# Patient Record
Sex: Male | Born: 1955 | Race: Black or African American | Hispanic: No | Marital: Married | Smoking: Former smoker
Health system: Southern US, Community
[De-identification: ages and names within clinical notes are randomized; demographics above are authoritative.]

## PROBLEM LIST (undated history)

## (undated) DIAGNOSIS — I5040 Unspecified combined systolic (congestive) and diastolic (congestive) heart failure: Secondary | ICD-10-CM

## (undated) DIAGNOSIS — N179 Acute kidney failure, unspecified: Secondary | ICD-10-CM

## (undated) DIAGNOSIS — I420 Dilated cardiomyopathy: Secondary | ICD-10-CM

## (undated) DIAGNOSIS — G40109 Localization-related (focal) (partial) symptomatic epilepsy and epileptic syndromes with simple partial seizures, not intractable, without status epilepticus: Principal | ICD-10-CM

## (undated) DIAGNOSIS — S81009A Unspecified open wound, unspecified knee, initial encounter: Secondary | ICD-10-CM

## (undated) DIAGNOSIS — I509 Heart failure, unspecified: Secondary | ICD-10-CM

## (undated) DIAGNOSIS — D649 Anemia, unspecified: Secondary | ICD-10-CM

## (undated) DIAGNOSIS — E1140 Type 2 diabetes mellitus with diabetic neuropathy, unspecified: Secondary | ICD-10-CM

## (undated) DIAGNOSIS — E46 Unspecified protein-calorie malnutrition: Secondary | ICD-10-CM

## (undated) DIAGNOSIS — Z91148 Patient's other noncompliance with medication regimen for other reason: Secondary | ICD-10-CM

## (undated) DIAGNOSIS — Z9114 Patient's other noncompliance with medication regimen: Secondary | ICD-10-CM

## (undated) DIAGNOSIS — I10 Essential (primary) hypertension: Secondary | ICD-10-CM

## (undated) DIAGNOSIS — T879 Unspecified complications of amputation stump: Secondary | ICD-10-CM

## (undated) DIAGNOSIS — IMO0001 Reserved for inherently not codable concepts without codable children: Secondary | ICD-10-CM

## (undated) DIAGNOSIS — I5023 Acute on chronic systolic (congestive) heart failure: Secondary | ICD-10-CM

## (undated) DIAGNOSIS — G629 Polyneuropathy, unspecified: Secondary | ICD-10-CM

## (undated) DIAGNOSIS — H5440 Blindness, one eye, unspecified eye: Secondary | ICD-10-CM

## (undated) DIAGNOSIS — I739 Peripheral vascular disease, unspecified: Secondary | ICD-10-CM

## (undated) HISTORY — PX: MULTIPLE TOOTH EXTRACTIONS: SHX2053

## (undated) HISTORY — PX: COLONOSCOPY: SHX174

## (undated) HISTORY — PX: TONSILLECTOMY: SUR1361

## (undated) HISTORY — DX: Localization-related (focal) (partial) symptomatic epilepsy and epileptic syndromes with simple partial seizures, not intractable, without status epilepticus: G40.109

---

## 2000-09-04 ENCOUNTER — Encounter: Payer: Self-pay | Admitting: Emergency Medicine

## 2000-09-04 ENCOUNTER — Emergency Department (HOSPITAL_COMMUNITY): Admission: EM | Admit: 2000-09-04 | Discharge: 2000-09-04 | Payer: Self-pay | Admitting: *Deleted

## 2004-03-28 ENCOUNTER — Ambulatory Visit: Payer: Self-pay | Admitting: Family Medicine

## 2004-03-28 ENCOUNTER — Inpatient Hospital Stay (HOSPITAL_COMMUNITY): Admission: EM | Admit: 2004-03-28 | Discharge: 2004-03-30 | Payer: Self-pay | Admitting: *Deleted

## 2004-04-14 ENCOUNTER — Encounter: Admission: RE | Admit: 2004-04-14 | Discharge: 2004-07-13 | Payer: Self-pay | Admitting: Family Medicine

## 2004-04-19 ENCOUNTER — Ambulatory Visit: Payer: Self-pay | Admitting: Sports Medicine

## 2004-06-20 ENCOUNTER — Ambulatory Visit: Payer: Self-pay | Admitting: Family Medicine

## 2004-07-17 ENCOUNTER — Ambulatory Visit: Payer: Self-pay | Admitting: Family Medicine

## 2004-08-11 ENCOUNTER — Encounter: Admission: RE | Admit: 2004-08-11 | Discharge: 2004-11-09 | Payer: Self-pay | Admitting: Family Medicine

## 2004-08-11 ENCOUNTER — Encounter: Admission: RE | Admit: 2004-08-11 | Discharge: 2004-08-11 | Payer: Self-pay | Admitting: Sports Medicine

## 2004-09-19 ENCOUNTER — Ambulatory Visit: Payer: Self-pay | Admitting: Family Medicine

## 2004-10-27 ENCOUNTER — Ambulatory Visit: Payer: Self-pay | Admitting: Family Medicine

## 2004-11-16 ENCOUNTER — Ambulatory Visit: Payer: Self-pay | Admitting: Family Medicine

## 2005-01-02 ENCOUNTER — Ambulatory Visit: Payer: Self-pay | Admitting: Family Medicine

## 2005-01-03 ENCOUNTER — Encounter: Admission: RE | Admit: 2005-01-03 | Discharge: 2005-03-04 | Payer: Self-pay | Admitting: Family Medicine

## 2005-01-22 ENCOUNTER — Ambulatory Visit: Payer: Self-pay | Admitting: Family Medicine

## 2005-03-07 ENCOUNTER — Encounter: Admission: RE | Admit: 2005-03-07 | Discharge: 2005-06-05 | Payer: Self-pay | Admitting: Family Medicine

## 2005-03-15 ENCOUNTER — Ambulatory Visit: Payer: Self-pay | Admitting: Family Medicine

## 2005-05-08 ENCOUNTER — Ambulatory Visit: Payer: Self-pay | Admitting: Family Medicine

## 2005-06-05 ENCOUNTER — Ambulatory Visit: Payer: Self-pay | Admitting: Family Medicine

## 2005-08-20 ENCOUNTER — Ambulatory Visit: Payer: Self-pay | Admitting: Sports Medicine

## 2005-10-02 ENCOUNTER — Ambulatory Visit: Payer: Self-pay | Admitting: Family Medicine

## 2005-11-15 ENCOUNTER — Ambulatory Visit: Payer: Self-pay | Admitting: Family Medicine

## 2005-12-20 ENCOUNTER — Ambulatory Visit: Payer: Self-pay | Admitting: Sports Medicine

## 2006-01-18 ENCOUNTER — Ambulatory Visit: Payer: Self-pay

## 2006-02-20 ENCOUNTER — Ambulatory Visit: Payer: Self-pay | Admitting: Family Medicine

## 2006-03-25 ENCOUNTER — Ambulatory Visit: Payer: Self-pay | Admitting: Sports Medicine

## 2006-04-16 ENCOUNTER — Ambulatory Visit: Payer: Self-pay | Admitting: Family Medicine

## 2006-04-16 ENCOUNTER — Encounter (INDEPENDENT_AMBULATORY_CARE_PROVIDER_SITE_OTHER): Payer: Self-pay | Admitting: Family Medicine

## 2006-04-16 LAB — CONVERTED CEMR LAB
ALT: 159 units/L — ABNORMAL HIGH (ref 0–53)
AST: 108 units/L — ABNORMAL HIGH (ref 0–37)
Albumin: 4.7 g/dL (ref 3.5–5.2)
Alkaline Phosphatase: 86 units/L (ref 39–117)
BUN: 19 mg/dL (ref 6–23)
CO2: 29 meq/L (ref 19–32)
Calcium: 10.1 mg/dL (ref 8.4–10.5)
Chloride: 98 meq/L (ref 96–112)
Creatinine, Ser: 1.19 mg/dL (ref 0.40–1.50)
Glucose, Bld: 136 mg/dL — ABNORMAL HIGH (ref 70–99)
Potassium: 4.7 meq/L (ref 3.5–5.3)
Sodium: 139 meq/L (ref 135–145)
Total Bilirubin: 0.9 mg/dL (ref 0.3–1.2)
Total Protein: 8.2 g/dL (ref 6.0–8.3)

## 2006-04-17 ENCOUNTER — Encounter (INDEPENDENT_AMBULATORY_CARE_PROVIDER_SITE_OTHER): Payer: Self-pay | Admitting: Family Medicine

## 2006-04-17 LAB — CONVERTED CEMR LAB
HCV Ab: NEGATIVE
Hep A IgM: NEGATIVE
Hep B C IgM: NEGATIVE
Hepatitis B Surface Ag: NEGATIVE

## 2006-04-24 ENCOUNTER — Encounter: Admission: RE | Admit: 2006-04-24 | Discharge: 2006-04-24 | Payer: Self-pay | Admitting: Sports Medicine

## 2006-05-02 DIAGNOSIS — E1165 Type 2 diabetes mellitus with hyperglycemia: Secondary | ICD-10-CM | POA: Insufficient documentation

## 2006-05-02 DIAGNOSIS — I10 Essential (primary) hypertension: Secondary | ICD-10-CM | POA: Insufficient documentation

## 2006-05-02 DIAGNOSIS — E119 Type 2 diabetes mellitus without complications: Secondary | ICD-10-CM | POA: Insufficient documentation

## 2006-07-02 ENCOUNTER — Ambulatory Visit: Payer: Self-pay | Admitting: Family Medicine

## 2006-07-02 LAB — CONVERTED CEMR LAB: Hgb A1c MFr Bld: 8 %

## 2006-11-18 ENCOUNTER — Encounter (INDEPENDENT_AMBULATORY_CARE_PROVIDER_SITE_OTHER): Payer: Self-pay | Admitting: *Deleted

## 2007-01-07 ENCOUNTER — Ambulatory Visit: Payer: Self-pay | Admitting: Family Medicine

## 2007-01-07 ENCOUNTER — Encounter (INDEPENDENT_AMBULATORY_CARE_PROVIDER_SITE_OTHER): Payer: Self-pay | Admitting: Family Medicine

## 2007-01-07 LAB — CONVERTED CEMR LAB
ALT: 37 units/L (ref 0–53)
AST: 34 units/L (ref 0–37)
Albumin: 4.4 g/dL (ref 3.5–5.2)
Alkaline Phosphatase: 98 units/L (ref 39–117)
BUN: 11 mg/dL (ref 6–23)
CO2: 26 meq/L (ref 19–32)
Calcium: 10 mg/dL (ref 8.4–10.5)
Chloride: 100 meq/L (ref 96–112)
Creatinine, Ser: 1.18 mg/dL (ref 0.40–1.50)
Direct LDL: 78 mg/dL
Glucose, Bld: 143 mg/dL — ABNORMAL HIGH (ref 70–99)
Hgb A1c MFr Bld: 11.3 %
Potassium: 4.3 meq/L (ref 3.5–5.3)
Protein, U semiquant: 100
Sodium: 139 meq/L (ref 135–145)
Total Bilirubin: 0.5 mg/dL (ref 0.3–1.2)
Total Protein: 7.6 g/dL (ref 6.0–8.3)

## 2007-01-08 ENCOUNTER — Encounter (INDEPENDENT_AMBULATORY_CARE_PROVIDER_SITE_OTHER): Payer: Self-pay | Admitting: Family Medicine

## 2007-01-16 ENCOUNTER — Telehealth (INDEPENDENT_AMBULATORY_CARE_PROVIDER_SITE_OTHER): Payer: Self-pay | Admitting: Family Medicine

## 2007-01-17 ENCOUNTER — Encounter: Payer: Self-pay | Admitting: *Deleted

## 2007-02-05 ENCOUNTER — Ambulatory Visit: Payer: Self-pay | Admitting: Family Medicine

## 2007-03-07 ENCOUNTER — Ambulatory Visit: Payer: Self-pay | Admitting: Family Medicine

## 2007-03-10 ENCOUNTER — Telehealth (INDEPENDENT_AMBULATORY_CARE_PROVIDER_SITE_OTHER): Payer: Self-pay | Admitting: Family Medicine

## 2007-04-07 ENCOUNTER — Encounter (INDEPENDENT_AMBULATORY_CARE_PROVIDER_SITE_OTHER): Payer: Self-pay | Admitting: *Deleted

## 2007-04-07 ENCOUNTER — Ambulatory Visit: Payer: Self-pay | Admitting: Family Medicine

## 2007-04-07 LAB — CONVERTED CEMR LAB: Hgb A1c MFr Bld: 10.5 %

## 2007-04-08 ENCOUNTER — Encounter (INDEPENDENT_AMBULATORY_CARE_PROVIDER_SITE_OTHER): Payer: Self-pay | Admitting: *Deleted

## 2007-04-08 LAB — CONVERTED CEMR LAB
ALT: 37 units/L (ref 0–53)
AST: 30 units/L (ref 0–37)
Albumin: 4.5 g/dL (ref 3.5–5.2)
Alkaline Phosphatase: 82 units/L (ref 39–117)
BUN: 16 mg/dL (ref 6–23)
CO2: 24 meq/L (ref 19–32)
Calcium: 9.6 mg/dL (ref 8.4–10.5)
Chloride: 104 meq/L (ref 96–112)
Creatinine, Ser: 0.97 mg/dL (ref 0.40–1.50)
Direct LDL: 100 mg/dL — ABNORMAL HIGH
Glucose, Bld: 156 mg/dL — ABNORMAL HIGH (ref 70–99)
Potassium: 4.3 meq/L (ref 3.5–5.3)
Sodium: 141 meq/L (ref 135–145)
Total Bilirubin: 0.4 mg/dL (ref 0.3–1.2)
Total Protein: 7.6 g/dL (ref 6.0–8.3)

## 2007-05-07 ENCOUNTER — Ambulatory Visit: Payer: Self-pay | Admitting: Family Medicine

## 2007-05-07 ENCOUNTER — Encounter (INDEPENDENT_AMBULATORY_CARE_PROVIDER_SITE_OTHER): Payer: Self-pay | Admitting: *Deleted

## 2007-05-07 LAB — CONVERTED CEMR LAB
BUN: 20 mg/dL (ref 6–23)
CO2: 22 meq/L (ref 19–32)
Calcium: 9.7 mg/dL (ref 8.4–10.5)
Chloride: 100 meq/L (ref 96–112)
Creatinine, Ser: 1.12 mg/dL (ref 0.40–1.50)
Glucose, Bld: 191 mg/dL — ABNORMAL HIGH (ref 70–99)
Potassium: 4.3 meq/L (ref 3.5–5.3)
Sodium: 138 meq/L (ref 135–145)

## 2007-06-09 ENCOUNTER — Ambulatory Visit: Payer: Self-pay | Admitting: Family Medicine

## 2007-06-09 DIAGNOSIS — E669 Obesity, unspecified: Secondary | ICD-10-CM | POA: Insufficient documentation

## 2007-06-20 ENCOUNTER — Encounter (INDEPENDENT_AMBULATORY_CARE_PROVIDER_SITE_OTHER): Payer: Self-pay | Admitting: Family Medicine

## 2007-07-08 ENCOUNTER — Ambulatory Visit: Payer: Self-pay | Admitting: Family Medicine

## 2007-07-08 ENCOUNTER — Encounter (INDEPENDENT_AMBULATORY_CARE_PROVIDER_SITE_OTHER): Payer: Self-pay | Admitting: *Deleted

## 2007-07-10 LAB — CONVERTED CEMR LAB
BUN: 11 mg/dL (ref 6–23)
CO2: 25 meq/L (ref 19–32)
Calcium: 9.8 mg/dL (ref 8.4–10.5)
Chloride: 100 meq/L (ref 96–112)
Cholesterol: 161 mg/dL (ref 0–200)
Creatinine, Ser: 1.08 mg/dL (ref 0.40–1.50)
Glucose, Bld: 144 mg/dL — ABNORMAL HIGH (ref 70–99)
HDL: 43 mg/dL (ref >39)
LDL Cholesterol: 88 mg/dL (ref 0–99)
PSA: 0.37 ng/mL (ref 0.10–4.00)
Potassium: 4.4 meq/L (ref 3.5–5.3)
Sodium: 140 meq/L (ref 135–145)
Total CHOL/HDL Ratio: 3.7
Triglycerides: 150 mg/dL — ABNORMAL HIGH (ref <150)
VLDL: 30 mg/dL (ref 0–40)

## 2007-08-11 ENCOUNTER — Ambulatory Visit: Payer: Self-pay | Admitting: Sports Medicine

## 2007-09-30 ENCOUNTER — Ambulatory Visit: Payer: Self-pay | Admitting: Family Medicine

## 2007-09-30 ENCOUNTER — Encounter (INDEPENDENT_AMBULATORY_CARE_PROVIDER_SITE_OTHER): Payer: Self-pay | Admitting: Family Medicine

## 2007-10-01 DIAGNOSIS — N179 Acute kidney failure, unspecified: Secondary | ICD-10-CM | POA: Insufficient documentation

## 2007-10-01 LAB — CONVERTED CEMR LAB
BUN: 22 mg/dL (ref 6–23)
CO2: 23 meq/L (ref 19–32)
Calcium: 9.9 mg/dL (ref 8.4–10.5)
Chloride: 95 meq/L — ABNORMAL LOW (ref 96–112)
Creatinine, Ser: 1.72 mg/dL — ABNORMAL HIGH (ref 0.40–1.50)
Glucose, Bld: 389 mg/dL — ABNORMAL HIGH (ref 70–99)
Potassium: 3.8 meq/L (ref 3.5–5.3)
Sodium: 135 meq/L (ref 135–145)

## 2007-10-07 ENCOUNTER — Encounter (INDEPENDENT_AMBULATORY_CARE_PROVIDER_SITE_OTHER): Payer: Self-pay | Admitting: Family Medicine

## 2007-10-07 ENCOUNTER — Ambulatory Visit: Payer: Self-pay | Admitting: Family Medicine

## 2007-10-07 LAB — CONVERTED CEMR LAB
BUN: 21 mg/dL (ref 6–23)
CO2: 23 meq/L (ref 19–32)
Calcium: 9.9 mg/dL (ref 8.4–10.5)
Chloride: 101 meq/L (ref 96–112)
Creatinine, Ser: 1.11 mg/dL (ref 0.40–1.50)
Glucose, Bld: 309 mg/dL — ABNORMAL HIGH (ref 70–99)
Potassium: 4.7 meq/L (ref 3.5–5.3)
Sodium: 137 meq/L (ref 135–145)

## 2007-10-08 ENCOUNTER — Encounter (INDEPENDENT_AMBULATORY_CARE_PROVIDER_SITE_OTHER): Payer: Self-pay | Admitting: Family Medicine

## 2007-10-29 ENCOUNTER — Ambulatory Visit: Payer: Self-pay | Admitting: Family Medicine

## 2007-11-17 ENCOUNTER — Ambulatory Visit: Payer: Self-pay | Admitting: Family Medicine

## 2007-11-24 ENCOUNTER — Telehealth: Payer: Self-pay | Admitting: Pharmacist

## 2007-12-01 ENCOUNTER — Telehealth: Payer: Self-pay | Admitting: Pharmacist

## 2007-12-08 ENCOUNTER — Ambulatory Visit: Payer: Self-pay | Admitting: Family Medicine

## 2007-12-08 ENCOUNTER — Encounter (INDEPENDENT_AMBULATORY_CARE_PROVIDER_SITE_OTHER): Payer: Self-pay | Admitting: Family Medicine

## 2007-12-17 ENCOUNTER — Ambulatory Visit: Payer: Self-pay | Admitting: Family Medicine

## 2007-12-29 ENCOUNTER — Ambulatory Visit: Payer: Self-pay | Admitting: Family Medicine

## 2008-02-09 ENCOUNTER — Ambulatory Visit: Payer: Self-pay | Admitting: Family Medicine

## 2008-02-09 LAB — CONVERTED CEMR LAB: Hgb A1c MFr Bld: 10.6 %

## 2008-02-10 ENCOUNTER — Telehealth: Payer: Self-pay | Admitting: *Deleted

## 2008-02-23 ENCOUNTER — Ambulatory Visit: Payer: Self-pay | Admitting: Family Medicine

## 2008-03-25 ENCOUNTER — Ambulatory Visit: Payer: Self-pay | Admitting: Family Medicine

## 2008-04-22 ENCOUNTER — Ambulatory Visit: Payer: Self-pay | Admitting: Family Medicine

## 2008-04-22 ENCOUNTER — Encounter (INDEPENDENT_AMBULATORY_CARE_PROVIDER_SITE_OTHER): Payer: Self-pay | Admitting: Family Medicine

## 2008-04-22 LAB — CONVERTED CEMR LAB
BUN: 24 mg/dL — ABNORMAL HIGH (ref 6–23)
CO2: 28 meq/L (ref 19–32)
Calcium: 10.4 mg/dL (ref 8.4–10.5)
Chloride: 93 meq/L — ABNORMAL LOW (ref 96–112)
Creatinine, Ser: 1.26 mg/dL (ref 0.40–1.50)
Glucose, Bld: 359 mg/dL — ABNORMAL HIGH (ref 70–99)
Potassium: 4.3 meq/L (ref 3.5–5.3)
Sodium: 135 meq/L (ref 135–145)

## 2008-05-13 ENCOUNTER — Ambulatory Visit: Payer: Self-pay | Admitting: Family Medicine

## 2008-05-13 LAB — CONVERTED CEMR LAB: Hgb A1c MFr Bld: 12.7 %

## 2008-05-19 ENCOUNTER — Ambulatory Visit: Payer: Self-pay | Admitting: Family Medicine

## 2008-06-07 ENCOUNTER — Ambulatory Visit: Payer: Self-pay | Admitting: Family Medicine

## 2008-06-24 ENCOUNTER — Ambulatory Visit: Payer: Self-pay | Admitting: Family Medicine

## 2008-07-08 ENCOUNTER — Ambulatory Visit: Payer: Self-pay | Admitting: Family Medicine

## 2008-07-08 ENCOUNTER — Encounter (INDEPENDENT_AMBULATORY_CARE_PROVIDER_SITE_OTHER): Payer: Self-pay | Admitting: Family Medicine

## 2008-07-08 LAB — CONVERTED CEMR LAB: Potassium: 4 meq/L (ref 3.5–5.3)

## 2008-07-15 ENCOUNTER — Telehealth (INDEPENDENT_AMBULATORY_CARE_PROVIDER_SITE_OTHER): Payer: Self-pay

## 2008-08-05 ENCOUNTER — Encounter (INDEPENDENT_AMBULATORY_CARE_PROVIDER_SITE_OTHER): Payer: Self-pay | Admitting: Family Medicine

## 2008-08-24 ENCOUNTER — Ambulatory Visit: Payer: Self-pay | Admitting: Family Medicine

## 2008-09-30 ENCOUNTER — Ambulatory Visit: Payer: Self-pay | Admitting: Family Medicine

## 2008-10-08 ENCOUNTER — Ambulatory Visit: Payer: Self-pay | Admitting: Family Medicine

## 2008-10-26 ENCOUNTER — Ambulatory Visit: Payer: Self-pay | Admitting: Family Medicine

## 2008-12-01 ENCOUNTER — Encounter: Payer: Self-pay | Admitting: Family Medicine

## 2008-12-01 ENCOUNTER — Ambulatory Visit: Payer: Self-pay | Admitting: Family Medicine

## 2008-12-01 LAB — CONVERTED CEMR LAB
Cholesterol: 148 mg/dL (ref 0–200)
HDL: 34 mg/dL — ABNORMAL LOW (ref >39)
LDL Cholesterol: 71 mg/dL (ref 0–99)
Total CHOL/HDL Ratio: 4.4
Triglycerides: 215 mg/dL — ABNORMAL HIGH (ref <150)
VLDL: 43 mg/dL — ABNORMAL HIGH (ref 0–40)

## 2009-01-17 ENCOUNTER — Ambulatory Visit: Payer: Self-pay | Admitting: Family Medicine

## 2009-01-17 LAB — CONVERTED CEMR LAB: Hgb A1c MFr Bld: 13.1 %

## 2009-02-24 ENCOUNTER — Telehealth: Payer: Self-pay | Admitting: *Deleted

## 2009-02-28 ENCOUNTER — Ambulatory Visit: Payer: Self-pay | Admitting: Family Medicine

## 2009-04-11 ENCOUNTER — Ambulatory Visit: Payer: Self-pay | Admitting: Family Medicine

## 2009-04-11 LAB — CONVERTED CEMR LAB: Hgb A1c MFr Bld: >14 %

## 2009-05-24 ENCOUNTER — Ambulatory Visit: Payer: Self-pay | Admitting: Family Medicine

## 2009-05-24 DIAGNOSIS — F2089 Other schizophrenia: Secondary | ICD-10-CM | POA: Insufficient documentation

## 2009-06-15 ENCOUNTER — Encounter: Payer: Self-pay | Admitting: Family Medicine

## 2009-06-15 ENCOUNTER — Ambulatory Visit: Payer: Self-pay | Admitting: Family Medicine

## 2009-06-15 LAB — CONVERTED CEMR LAB
BUN: 19 mg/dL (ref 6–23)
CO2: 26 meq/L (ref 19–32)
Calcium: 10.4 mg/dL (ref 8.4–10.5)
Chloride: 92 meq/L — ABNORMAL LOW (ref 96–112)
Creatinine, Ser: 1.09 mg/dL (ref 0.40–1.50)
Glucose, Bld: 436 mg/dL — ABNORMAL HIGH (ref 70–99)
Potassium: 3.6 meq/L (ref 3.5–5.3)
Sodium: 132 meq/L — ABNORMAL LOW (ref 135–145)

## 2009-07-14 ENCOUNTER — Ambulatory Visit: Payer: Self-pay | Admitting: Family Medicine

## 2009-07-14 DIAGNOSIS — M25569 Pain in unspecified knee: Secondary | ICD-10-CM | POA: Insufficient documentation

## 2009-07-14 LAB — CONVERTED CEMR LAB: Hgb A1c MFr Bld: >14 %

## 2009-08-15 ENCOUNTER — Ambulatory Visit: Payer: Self-pay | Admitting: Family Medicine

## 2009-10-28 ENCOUNTER — Ambulatory Visit: Payer: Self-pay | Admitting: Family Medicine

## 2009-10-28 LAB — CONVERTED CEMR LAB: Hgb A1c MFr Bld: >14 %

## 2010-01-24 ENCOUNTER — Ambulatory Visit: Payer: Self-pay | Admitting: Family Medicine

## 2010-01-24 LAB — CONVERTED CEMR LAB: Hgb A1c MFr Bld: >14 %

## 2010-03-03 ENCOUNTER — Ambulatory Visit: Payer: Self-pay | Admitting: Family Medicine

## 2010-03-26 ENCOUNTER — Encounter: Payer: Self-pay | Admitting: Sports Medicine

## 2010-04-04 NOTE — Assessment & Plan Note (Signed)
Summary: FU HTN, DM, foot callous   Vital Signs:  Patient Profile:   55 Years Old Male Weight:      270.8 pounds Temp:     98.3 degrees F Pulse rate:   99 / minute BP sitting:   136 / 89  (left arm)  Pt. in pain?   no  Vitals Entered By: Jacki Cones RN (August 11, 2007 2:54 PM)                  Chief Complaint:  f/u visit htn, dm, and peventio.  History of Present Illness: 55yr old AAM presents to discuss the following:  1) DM - A1C last month was 8%.  Was previously 10%. Pt said made that change by cutting out ice cream but has gone back to it. Recently also supposed to increase glipizide to 20mg  but didn't do this. Did start Januvia with his metformin. No side effects. Tolerating all well. CBGs running in the 300s. Didn't bring meter oday. Eye exam UTD. Checks feet regularly.  2) HTN - Was supposed to increase enalapril dose to 20mg  but reports he ran out by taking 2 and the pharamcy wouldn' refill early. Not taking any in over 1 week. Still on norvasc. No CP, SOB, orthopnea, peripheral edeam.  3) Prevention - Lipids and PSA normal / at goal.  Tdap UTD.  EYE exam 4/17 and in chart. Needs colonoscopy.     Updated Prior Medication List: ONETOUCH ULTRA TEST  STRP (GLUCOSE BLOOD) test once daily before breakfast.  May substitute brands if necessary. METFORMIN HCL 1000 MG TABS (METFORMIN HCL) Take 1 tablet by mouth two times a day AMLODIPINE BESYLATE 10 MG  TABS (AMLODIPINE BESYLATE) one tablet daily ENALAPRIL MALEATE 20 MG  TABS (ENALAPRIL MALEATE) Take 1 tab by mouth daily GLIPIZIDE XL 10 MG TB24 (GLIPIZIDE) 2 tablet by mouth daily ASPIRIN 81 MG  TBEC (ASPIRIN) one by mouth every day TRIAMCINOLONE ACETONIDE 0.1 % CREA (TRIAMCINOLONE ACETONIDE) apply bid to affected area  dispense 30 gram tube JANUVIA 100 MG TABS (SITAGLIPTIN PHOSPHATE) Take 1 tab by mouth daily  Current Allergies (reviewed today): No known allergies   Past Medical History:    Reviewed history from  02/05/2007 and no changes required:       - T2DM       - HTN       - ?MR       - h/o elevated LFTs in Feb 2008 with neg hep panel and neg abd [place] except for fatty liver - FU LFTS 2008 normal   Social History:    Reviewed history from 03/07/2007 and no changes required:       Pt lives alone, Former 12 pack year smoker, former heavy drinker, no illicts; On disability--but pt won't elaborate. exercises 1hour daily with weight lighting and jogging.      Physical Exam  General:     obese AAM,in no acute distress; alert,appropriate and cooperative throughout examination. has odd movements and facial expressions.  Head:     Normocephalic and atraumatic without obvious abnormalities. Mouth:     pharynx pink and moist.   Lungs:     Normal respiratory effort, chest expands symmetrically. Lungs are clear to auscultation, no crackles or wheezes. Heart:     Normal rate and regular rhythm. S1 and S2 normal without gallop, murmur, click, rub or other extra sounds. Pulses:     2+ dp and pt pulses Extremities:     no edema.  no foot lesions/ulcerations. large 2cm callous on lateral aspect of R foot.  Skin:     turgor normal and color normal.   Psych:     normally interactive and good eye contact.  Has some odd facial movements and almost mouths words to himself during our interview.   Diabetes Management Exam:    Foot Exam (with socks and/or shoes not present):       Sensory-Pinprick/Light touch:          Left medial foot (L-4): normal          Left dorsal foot (L-5): normal          Left lateral foot (S-1): normal          Right medial foot (L-4): normal          Right dorsal foot (L-5): normal          Right lateral foot (S-1): normal       Sensory-Monofilament:          Left foot: normal          Right foot: normal       Inspection:          Left foot: normal          Right foot: abnormal             Comments: Large 2cm callous on lateral aspect of R midfoot.        Nails:           Left foot: normal          Right foot: normal    Impression & Recommendations:  Problem # 1:  DIABETES MELLITUS II, UNCOMPLICATED (ICD-250.00) Assessment: Unchanged Will have him start the increased dose of glipizide. Discussed that insulin may be in his future.  Will check A1C in August.   His updated medication list for this problem includes:    Metformin Hcl 1000 Mg Tabs (Metformin hcl) .Marland Kitchen... Take 1 tablet by mouth two times a day    Enalapril Maleate 20 Mg Tabs (Enalapril maleate) .Marland Kitchen... Take 1 tab by mouth daily    Glipizide Xl 10 Mg Tb24 (Glipizide) .Marland Kitchen... 2 tablet by mouth daily    Aspirin 81 Mg Tbec (Aspirin) ..... One by mouth every day    Januvia 100 Mg Tabs (Sitagliptin phosphate) .Marland Kitchen... Take 1 tab by mouth daily  Orders: [facility]- Est  Level 4 ([address])   Problem # 2:  HYPERTENSION, BENIGN SYSTEMIC (ICD-401.1) Assessment: Deteriorated Asked that he refill and take the ACE inhibitor. Sent rx in. Will need creatinine checkd at next visit.   His updated medication list for this problem includes:    Amlodipine Besylate 10 Mg Tabs (Amlodipine besylate) ..... One tablet daily    Enalapril Maleate 20 Mg Tabs (Enalapril maleate) .Marland Kitchen... Take 1 tab by mouth daily  Orders: Basic Met-[facility] ([address]-09811) [facility]- Est  Level 4 ([address])   Problem # 3:  OBESITY, UNSPECIFIED (ICD-278.00) Assessment: Improved Has lost 6 lbs in the last month. Advised continued exercise and portion control.  Orders: [facility]- Est  Level 4 ([address])   Problem # 4:  Preventive Health Care (ICD-V70.0) Referral for colonoscopy.   Problem # 5:  CALLUS, RIGHT FOOT (ICD-700) Assessment: New See instructions.  Orders: [facility]- Est  Level 4 ([address]86)   Complete Medication List: 1)  Onetouch Ultra Test Strp (Glucose blood) .... Test once daily before breakfast.  may substitute brands if necessary. 2)  Metformin Hcl 1000 Mg Tabs (  Metformin hcl) .... Take 1 tablet by mouth two times a day 3)  Amlodipine Besylate 10 Mg Tabs  (Amlodipine besylate) .... One tablet daily 4)  Enalapril Maleate 20 Mg Tabs (Enalapril maleate) .... Take 1 tab by mouth daily 5)  Glipizide Xl 10 Mg Tb24 (Glipizide) .... 2 tablet by mouth daily 6)  Aspirin 81 Mg Tbec (Aspirin) .... One by mouth every day 7)  Triamcinolone Acetonide 0.1 % Crea (Triamcinolone acetonide) .... Apply bid to affected area  dispense 30 gram tube 8)  Januvia 100 Mg Tabs (Sitagliptin phosphate) .... Take 1 tab by mouth daily  Other Orders: Gastroenterology Referral (GI)   Patient Instructions: 1)  Please schedule a follow-up appointment in 1-2 months with Dr Clelia Croft to check on your blood pressure.  2)  Restart on your Enalapril 20mg  daily. 3)  Take 2 of your Glipizide tablets (I called in a new prescription for this. If they will not fill it early, please call my office and we will be sure they give it to you) 4)  It is important that you exercise regularly at least 20 minutes 5 times a week. If you develop chest pain, have severe difficulty breathing, or feel very tired , stop exercising immediately and seek medical attention. 5)  It is okay to keep the callous on your right foot shaved down but only shave a little bit off at a time and if you ever notice a cut in that area, you need to be seen right away.    Prescriptions: GLIPIZIDE XL 10 MG TB24 (GLIPIZIDE) 2 tablet by mouth daily  #60 x 3   Entered and Authorized by:   Lupita Raider MD   Signed by:   Lupita Raider MD on 08/11/2007   Method used:   Electron[facility] sent to ...       [facility] [address] [phone]*       [address]       [place], [place]  [address]       Ph: [phone] or [phone]       Fax: [phone]   RxID:   [id number] ENALAPRIL MALEATE 20 MG  TABS (ENALAPRIL MALEATE) Take 1 tab by mouth daily  #30 x 3   Entered and Authorized by:   Lupita Raider MD   Signed by:   Lupita Raider MD on 08/11/2007   Method used:   Electronically sent to ...        [facility] [address] [phone]*       [address]       [place], [place]  [address]       Ph: [phone] or [phone]       Fax: [phone]   RxID:   [id number]  ]

## 2010-04-04 NOTE — Progress Notes (Signed)
Summary: Rx clinic: follow up on DM  Phone Note Outgoing Call   Call placed by: Steve Rattler, PharmD Call placed to: Patient Summary of Call: Called patient to follow up on his blood sugar readings and tolerating the increase in his insulin dose.  Patient stated his 7 day blood sugar reading was 411 mg/dl.  He blood sugars are remaining above 300.  Recommended increasing his Humalog mix 72/25 to 45 units two times a day.  I will call patient next week to follow up.

## 2010-04-04 NOTE — Letter (Signed)
Summary: Generic Letter  [name]  [address]   [place], [place] [address]   Phone: [phone]  Fax: [phone]    01/08/2007 MRN: [id number]  [address] [place], [place]  [address]  Dear [name],  Your bloodwork all came back normal, which is great news!  Please call me if you are having any problems with the medications like we talked about.  See you in a month or two.   Sincerely, Lupita Raider MD [name]  Appended Document: Generic Letter patient letter mailed

## 2010-04-04 NOTE — Assessment & Plan Note (Signed)
Summary: Diabetes - Lantus initiation Rx Clinic   Vital Signs:  Patient Profile:   55 Years Old Male Height:     73.5 inches Weight:      252.9 pounds BMI:     33.03 Pulse rate:   81 / minute BP sitting:   118 / 81  (left arm)                  Diabetes Management History:      The patient is a 55 years old male who comes in for evaluation of DM Type 2.  He is checking home blood sugars.  He says that he is exercising.  Type of exercise includes: running.  Duration of exercise is estimated to be 60 min.  He is doing this 3 times per week.        Hypoglycemic symptoms are not occurring.  Hyperglycemic symptoms include polyuria and polydipsia.        His home fasting blood sugars are as follows: highest: 537; lowest: 267; average: 340.  His 4:00 PM blood sugars reveal: average: 300.       Current Allergies (reviewed today): No known allergies     Risk Factors:  Exercise:  yes    Times per week:  3    Type:  running      Impression & Recommendations:  Problem # 1:  DIABETES MELLITUS II, UNCOMPLICATED (ICD-250.00) Assessment: Deteriorated AIC not at goal with oral therapy.  Patient willing to iniitate insulin at this time given high A1C and persistent elevation in CBGs.  Diabetes of:  3 yrs duration currently under: poor control of blood glucose based on A1C of: 14  fasting CBGs of: >250 and random readings of: 300-500 Control is suboptimal due to: suboptimal drug regimen and need for insulin.  Denies hypoglycemic events.  Able to verbalize appropriate hypoglycemia management plan.  :Initiated  basal insulin Lantus dose of 10 units once dialy in the AM.   Written pt instructions provided:   F/U Rx Clinic Visit: in 2 weeks.  Plan phone call f/u for titration in 1 week.   TTFFC 50:  mins.  Pt seen with: Mina Marble, PharmD Resident. No change in oral drugs for diabetes at this time.   His updated medication list for this problem includes:    Metformin Hcl 1000 Mg Tabs  (Metformin hcl) .Marland Kitchen... Take 1 tablet by mouth two times a day    Enalapril Maleate 20 Mg Tabs (Enalapril maleate) .Marland Kitchen... 1/2 tablet daily    Glipizide Xl 10 Mg Tb24 (Glipizide) .Marland Kitchen... 2 tablet by mouth daily    Aspirin 81 Mg Tbec (Aspirin) ..... One by mouth every day    Januvia 100 Mg Tabs (Sitagliptin phosphate) .Marland Kitchen... Take 1 tab by mouth daily    Lantus 100 Unit/ml Soln (Insulin glargine) .Marland KitchenMarland KitchenMarland KitchenMarland Kitchen 10 units once daily in the morning  Orders: Inital Assessment Each - [facility] ([address])   Complete Medication List: 1)  Onetouch Ultra Test Strp (Glucose blood) .... Test once daily before breakfast.  may substitute brands if necessary. 2)  Metformin Hcl 1000 Mg Tabs (Metformin hcl) .... Take 1 tablet by mouth two times a day 3)  Amlodipine Besylate 10 Mg Tabs (Amlodipine besylate) .... One tablet daily 4)  Enalapril Maleate 20 Mg Tabs (Enalapril maleate) .... 1/2 tablet daily 5)  Glipizide Xl 10 Mg Tb24 (Glipizide) .... 2 tablet by mouth daily 6)  Aspirin 81 Mg Tbec (Aspirin) .... One by mouth every day 7)  Triamcinolone Acetonide 0.1 % Crea (Triamcinolone acetonide) .... Apply bid to affected area  dispense 30 gram tube 8)  Januvia 100 Mg Tabs (Sitagliptin phosphate) .... Take 1 tab by mouth daily 9)  Lantus 100 Unit/ml Soln (Insulin glargine) .Marland Kitchen.. 10 units once daily in the morning 10)  [facility] Insulin Syringe 29g X 1/2" 1 Ml Misc (Insulin syringe-needle u-100) .... Quantity for once daily dosing - 1 month supply  Diabetes Management Assessment/Plan:      The following lipid goals have been established for the patient: Total cholesterol goal of 200; LDL cholesterol goal of 100; HDL cholesterol goal of 40; Triglyceride goal of 200.  His blood pressure goal is < 130/80.     Patient Instructions: 1)  Start Lantus Insulin 10 units each morning  2)  Continue checking blood sugars twice daily with QAM and then other times as convenient. 3)  Follow-up with Dr. Clelia Croft in 3-4 weeks. 4)  Follow-up visit with  Rx Clinic in 2 weeks.   Prescriptions: [facility] INSULIN SYRINGE 29G X 1/2" 1 ML MISC (INSULIN SYRINGE-NEEDLE U-100) quantity for once daily dosing - 1 month supply  #1 x 11   Entered by:   Madelon Lips PHARMD   Authorized by:   Lupita Raider MD   Signed by:   Madelon Lips PHARMD on 11/17/2007   Method used:   Faxed to ...       [facility] [address] [phone]* (retail)       [address]       [place], [place]  [address]       Ph: [phone] or [phone]       Fax: [phone]   RxID:   [id number] LANTUS 100 UNIT/ML SOLN (INSULIN GLARGINE) 10 units once daily in the morning  #1 x 1   Entered by:   Madelon Lips PHARMD   Authorized by:   Lupita Raider MD   Signed by:   Madelon Lips PHARMD on 11/17/2007   Method used:   Telephoned to ...       [facility] [address] [phone]* (retail)       [address]       [place], [place]  [address]       Ph: [phone] or [phone]       Fax: [phone]   RxID:   [id number]  ]

## 2010-04-04 NOTE — Assessment & Plan Note (Signed)
Summary: Rx clinic: Dm follow up   Vital Signs:  Patient Profile:   54 Years Old Male Height:     73.25 inches Weight:      273 pounds Pulse rate:   86 / minute BP sitting:   145 / 92                 Chief Complaint:  Diabetes Management.  History of Present Illness: 55 yo male presents for DM follow up.  States he has not done well.  He is not eating well, mainly junk food.  Not exercising as much because of the snow and ice.  His blood sugars are ranging between 200 to 300.  His 7 day average 297, 14 day is 321, and 28 day is 277.  States he did not miss doses of medications.      Prior Medication List:  PRODIGY AUTOCODE BLOOD GLUCOSE  DEVI (BLOOD GLUCOSE MONITORING SUPPL) one meter GLIPIZIDE XL 10 MG TB24 (GLIPIZIDE) 2 tablet by mouth daily ASPIRIN 81 MG  TBEC (ASPIRIN) one by mouth every day LANTUS 100 UNIT/ML SOLN (INSULIN GLARGINE) 47 units once daily in the morning. Dispense 1 month supply [facility] INSULIN SYRINGE 29G X 1/2" 1 ML MISC (INSULIN SYRINGE-NEEDLE U-100) quantity for once daily dosing - 1 month supply PRODIGY TWIST TOP LANCETS 28G  MISC (LANCETS) use as directed PRODIGY BLOOD GLUCOSE TEST  STRP (GLUCOSE BLOOD) use as directed LISINOPRIL-HYDROCHLOROTHIAZIDE 20-12.5 MG TABS (LISINOPRIL-HYDROCHLOROTHIAZIDE) Take 2 tablets once a day.   Current Allergies: No known allergies         Impression & Recommendations:  Problem # 1:  DIABETES MELLITUS II, UNCOMPLICATED (ICD-250.00) DM remains uncontrolled with averages ranging above 200.  Will increase Lantus to 50 units a day.  May need to consider adding meal time insulin if he continues to remain above his goal.  Will restart ASA 81 mg a day for cardioprotection.  Encouraged more walking to help his blood sugars as well.   His updated medication list for this problem includes:    Glipizide Xl 10 Mg Tb24 (Glipizide) .Marland Kitchen... 2 tablet by mouth daily    Aspirin 81 Mg Tbec (Aspirin) ..... One by mouth every day  Lantus 100 Unit/ml Soln (Insulin glargine) .Marland KitchenMarland KitchenMarland KitchenMarland Kitchen 50 units once daily in the morning. dispense 1 month supply    Lisinopril-hydrochlorothiazide 20-12.5 Mg Tabs (Lisinopril-hydrochlorothiazide) .Marland Kitchen... Take 2 tablets once a day.  Orders: Basic Met-[facility] ([address]74-25956) Reassessment Each 15 min unit- [facility] (38756)   Problem # 2:  HYPERTENSION, BENIGN SYSTEMIC (ICD-401.1) Blood pressure remains high.  Recently stopped Norvasc due to not taking routinely.  Added HCTZ at last visit.  Will check a BMET today to make sure potassium is ok.  He complaining of more cramps.  Most likely will need to add another agent.  His updated medication list for this problem includes:    Lisinopril-hydrochlorothiazide 20-12.5 Mg Tabs (Lisinopril-hydrochlorothiazide) .Marland Kitchen... Take 2 tablets once a day.  Orders: Basic Met-[facility] ([address]-51884) Reassessment Each 15 min unit- [facility] ([address])   Complete Medication List: 1)  Prodigy Autocode Blood Glucose Devi (Blood glucose monitoring suppl) .... One meter 2)  Glipizide Xl 10 Mg Tb24 (Glipizide) .... 2 tablet by mouth daily 3)  Aspirin 81 Mg Tbec (Aspirin) .... One by mouth every day 4)  Lantus 100 Unit/ml Soln (Insulin glargine) .... 50 units once daily in the morning. dispense 1 month supply 5)  [facility] Insulin Syringe 29g X 1/2" 1 Ml Misc (Insulin syringe-needle u-100) .... Quantity for  once daily dosing - 1 month supply 6)  Prodigy Twist Top Lancets 28g Misc (Lancets) .... Use as directed 7)  Prodigy Blood Glucose Test Strp (Glucose blood) .... Use as directed 8)  Lisinopril-hydrochlorothiazide 20-12.5 Mg Tabs (Lisinopril-hydrochlorothiazide) .... Take 2 tablets once a day.   Patient Instructions: 1)  Please schedule a follow-up appointment in 3 weeks in pharmacy clnic. 2)  It is important that you exercise regularly at least 20 minutes 5 times a week. If you develop chest pain, have severe difficulty breathing, or feel very tired , stop exercising immediately and seek medical  attention. 3)  Check your blood sugars regularly. If your readings are usually above 300 or below 70 you should contact our office. 4)  Increase the Lantus to 50 units once a day 5)  Continue all your other medications 6)  Restart the Aspirin 81 mg once a day. 7)  Try to stop the junk food.  Eat more health foods, like fruits, vegatables, and meat that is not fried. 8)  Bring the letter from [facility] stating which medications they will cover so we can change your medicine if needed.

## 2010-04-04 NOTE — Assessment & Plan Note (Signed)
Summary: f/u dm,df   Vital Signs:  Patient profile:   55 year old male Height:      71 inches Weight:      249 pounds BMI:     34.85 BSA:     2.32 Temp:     98.4 degrees F Pulse rate:   156 / minute BP sitting:   116 / 77  Vitals Entered By: Jone Baseman CMA (October 28, 2009 2:07 PM) CC: f/u Is Patient Diabetic? Yes Did you bring your meter with you today? No Pain Assessment Patient in pain? no        CC:  f/u.  History of Present Illness: 1. DM:  He is not taking his Insulin as prescribed.  He is only taking the Lantus 50 Units at night.  Not taking any insulin in the morning.  He doesn't like the way it makes him feel in the morning.  His sugars have been elevated  ~ 300.  ROS: denies vision changes, leg pain, numbness / weakness  2. HTN: He hasn't been taking his medicines as prescribed, but he has been taking them for the past week.  He doesn't like the way the medicines make him feel (sluggish).  He doesn't want to switch medicines or try a more natural approach.  ROS: denies chest pain or shortness of breath  Habits & Providers  Alcohol-Tobacco-Diet     Tobacco Status: never  Current Medications (verified): 1)  Prodigy Autocode Blood Glucose  Devi (Blood Glucose Monitoring Suppl) .... One Meter 2)  Aspirin 81 Mg  Tbec (Aspirin) .... One By Mouth Every Day 3)  [facility]vs Insulin Syringe 29g X 1/2" 1 Ml Misc (Insulin Syringe-Needle U-100) .... Quantity For Once Daily Dosing - 1 Month Supply 4)  Prodigy Twist Top Lancets 28g  Misc (Lancets) .... Use As Directed 5)  Prodigy Blood Glucose Test  Strp (Glucose Blood) .... Use As Directed 6)  Lantus 100 Unit/ml Soln (Insulin Glargine) .... Inject 50 Units in The Morning and 25 Units in The Evening 7)  Multivitamins  Tabs (Multiple Vitamin) .... One Daily 8)  Losartan Potassium-Hctz 50-12.5 Mg Tabs (Losartan Potassium-Hctz) .... Take 1 Tab By Mouth Daily  Allergies: No Known Drug Allergies  Past History:  Past Medical  History: Reviewed history from 05/24/2009 and no changes required. - T2DM - HTN - ?schizophrenia - nothing has been documented but pt often reacts to something in the room that I don't appreciate and refers to himself as "we" a lot. odd behaviors like making strange noises and washing hands in bleach - h/o elevated LFTs in Feb 2008 with neg hep panel and neg abd [place] except for fatty liver - FU LFTS 2008 normal  Social History: Reviewed history from 05/24/2009 and no changes required. Pt lives alone, Former 12 pack year smoker, former heavy drinker, no illicts; On disability--but pt won't elaborate. Jogs for exercise.  Denies tobacco, alcohol, or drug use  Physical Exam  General:  Vitals reviewed. obese AAM,in no acute distress; alert,appropriate and cooperative throughout examination. has odd movements and facial expressions.  Mouth:  poor dentition.   Neck:  no carotid bruits Lungs:  normal respiratory effort, no crackles, and no wheezes.   Heart:  normal rate, regular rhythm, no murmur, and no gallop.   Abdomen:  Bowel sounds positive,abdomen soft and non-tender without masses Extremities:  no edema.    Impression & Recommendations:  Problem # 1:  DIABETES MELLITUS II, UNCOMPLICATED (ICD-250.00) Assessment Unchanged Not at goal.  He is willing to take more medicine at night.  Will increase Lantus to 60 Units at night. His updated medication list for this problem includes:    Aspirin 81 Mg Tbec (Aspirin) ..... One by mouth every day    Lantus 100 Unit/ml Soln (Insulin glargine) ..... Inject 60 units at night    Losartan Potassium-hctz 50-12.5 Mg Tabs (Losartan potassium-hctz) .Marland Kitchen... Take 1 tab by mouth daily  Orders: A1C-[facility] ([address]) [facility]- Est Level  3 ([address])  Problem # 2:  HYPERTENSION, BENIGN SYSTEMIC (ICD-401.1) Assessment: Unchanged  Well controlled when he is taking his medicines.  Encouraged him to continue to take his medicines. His updated medication list for this  problem includes:    Losartan Potassium-hctz 50-12.5 Mg Tabs (Losartan potassium-hctz) .Marland Kitchen... Take 1 tab by mouth daily  Orders: [facility]- Est Level  3 ([address])  Complete Medication List: 1)  Prodigy Autocode Blood Glucose Devi (Blood glucose monitoring suppl) .... One meter 2)  Aspirin 81 Mg Tbec (Aspirin) .... One by mouth every day 3)  [facility]vs Insulin Syringe 29g X 1/2" 1 Ml Misc (Insulin syringe-needle u-100) .... Quantity for once daily dosing - 1 month supply 4)  Prodigy Twist Top Lancets 28g Misc (Lancets) .... Use as directed 5)  Prodigy Blood Glucose Test Strp (Glucose blood) .... Use as directed 6)  Lantus 100 Unit/ml Soln (Insulin glargine) .... Inject 60 units at night 7)  Multivitamins Tabs (Multiple vitamin) .... One daily 8)  Losartan Potassium-hctz 50-12.5 Mg Tabs (Losartan potassium-hctz) .... Take 1 tab by mouth daily  Patient Instructions: 1)  Lets go to 60 Units of Lantus at night 2)  Continue taking your blood pressure medicines 3)  Please come back and see me in 6 weeks  Laboratory Results   Blood Tests   Date/Time Received: October 28, 2009 2:03 PM  Date/Time Reported: October 28, 2009 2:15 PM   HGBA1C: >14.0%   (Normal Range: Non-Diabetic - 3-6%   Control Diabetic - 6-8%)  Comments: ...............test performed by......Marland KitchenBonnie A. [place], MLS (ASCP)cm

## 2010-04-04 NOTE — Assessment & Plan Note (Signed)
Summary: F/U Diabetes Insulin - Rx Clinic   Vital Signs:  Patient profile:   55 year old male Weight:      266 pounds BMI:     37.23 Pulse rate:   79 / minute BP sitting:   113 / 79  (right arm)  Nutrition Counseling: Patient's BMI is greater than 25 and therefore counseled on weight management options.  History of Present Illness: Patient arrives with blood glucometer Readings include  7 day average 366 14 day average 351 28 day average 341  Exercise is reported to include running 3 days per weeks.  4-5 miles  Meals consist of pasta, bread, rice and cookies,  Reports chicken, canned salmon, grits and eggs, and sausage once per week.  Minimal intake of vegetables.   Current Medications (verified): 1)  Prodigy Autocode Blood Glucose  Devi (Blood Glucose Monitoring Suppl) .... One Meter 2)  Aspirin 81 Mg  Tbec (Aspirin) .... One By Mouth Every Day 3)  Cvs Insulin Syringe 29g X 1/2" 1 Ml Misc (Insulin Syringe-Needle U-100) .... Quantity For Once Daily Dosing - 1 Month Supply 4)  Prodigy Twist Top Lancets 28g  Misc (Lancets) .... Use As Directed 5)  Prodigy Blood Glucose Test  Strp (Glucose Blood) .... Use As Directed 6)  Lisinopril-Hydrochlorothiazide 20-12.5 Mg Tabs (Lisinopril-Hydrochlorothiazide) .... Take One Daily 7)  Humalog Mix 75/25 75-25 % Susp (Insulin Lispro Prot & Lispro) .... 50 Units in The Morning and 60 Units in The Eveing Prior To Meals  Allergies (verified): No Known Drug Allergies   Impression & Recommendations:  Problem # 1:  DIABETES MELLITUS II, UNCOMPLICATED (ICD-250.00) Assessment Unchanged  Diabetes of: several yrs duration currently under: poor but improving control of blood glucose based on  CBGs of >300 routinely Control is suboptimal due to: dietary indiscretion and insulin resistance.  reports hypoglycemic event when he did not eat in the AM and used his AM insuling - Blood glucose reading was 86 when he checked.  Able to verbalize appropriate  hypoglycemia management plan.  Adjusted humalog 75/25  Changed dose to 50 units in the AM and 60 units in the evening. Patient will try to walk more DAILY and eating more vegetables.  He will try to lose 4 pounds in the next month.  Written pt instructions provided:   F/U Rx Clinic Visit: 4-6 weeks.  TTFFC:35 minutes  mins.  Pt seen with: Randall Hiss, PharmD  His updated medication list for this problem includes:    Aspirin 81 Mg Tbec (Aspirin) ..... One by mouth every day    Lisinopril-hydrochlorothiazide 20-12.5 Mg Tabs (Lisinopril-hydrochlorothiazide) .[name]... Take one daily    Humalog Mix 75/25 75-25 % Susp (Insulin lispro prot & lispro) .Jake KitchenMarland KitchenMarland KitchenMarland Tucker 50 units in the morning and 60 units in the eveing prior to meals  Orders: Reassessment Each 15 min unit- [facility] (16109)  Problem # 2:  HYPERTENSION, BENIGN SYSTEMIC (ICD-401.1) Assessment: Improved  Admits to nonadherence with BP meds due to feeling of tiredness.  Admits to not taking medicine for BP 3-4 days per week.  Asked to restart taking ONE lisinopril/hctz daily and monitor for symptom of fatigue to improve with lower blood glucose readings.  Doubt the fatigue is only due to BP medicine.  His updated medication list for this problem includes:    Lisinopril-hydrochlorothiazide 20-12.5 Mg Tabs (Lisinopril-hydrochlorothiazide) .[name]... Take one daily  Orders: Reassessment Each 15 min unit- [facility] [phone])  Complete Medication List: 1)  Prodigy Autocode Blood Glucose Devi (Blood glucose monitoring suppl) .Jake KitchenMarland KitchenMarland Tucker  One meter 2)  Aspirin 81 Mg Tbec (Aspirin) .... One by mouth every day 3)  Cvs Insulin Syringe 29g X 1/2" 1 Ml Misc (Insulin syringe-needle u-100) .... Quantity for once daily dosing - 1 month supply 4)  Prodigy Twist Top Lancets 28g Misc (Lancets) .... Use as directed 5)  Prodigy Blood Glucose Test Strp (Glucose blood) .... Use as directed 6)  Lisinopril-hydrochlorothiazide 20-12.5 Mg Tabs (Lisinopril-hydrochlorothiazide) .... Take one  daily 7)  Humalog Mix 75/25 75-25 % Susp (Insulin lispro prot & lispro) .... 50 units in the morning and 60 units in the eveing prior to meals  Patient Instructions: 1)  It was great to see you today. Great job on losing weight and trying to eat healthy. Keep up the good work! 2)  Use 50 units of Humalog at breakfast and 60 units at dinner. This is a increase in your dose. 3)  Keep checking your blood sugars and bring your meter at your next visit. Call us if you feel dizzy or sweaty again. 4)  Take one lisinopril/HCTZ tablet every day instead of two tablets. Your blood pressure looked great today. 5)  Make an appointment with Paulino Rily in 4-6 weeks to recheck your sugars. Prescriptions: LISINOPRIL-HYDROCHLOROTHIAZIDE 20-12.5 MG TABS (LISINOPRIL-HYDROCHLOROTHIAZIDE) take one daily  #30 x 1   Entered and Authorized by:   Christian Mate D   Signed by:   Madelon Lips Pharm D on 08/24/2008   Method used:   Historical   RxID:   [id number]

## 2010-04-04 NOTE — Progress Notes (Signed)
Summary: pharmacy question from pt  ---- Converted from flag ---- ---- 03/10/2007 4:50 PM, JESSICA FLEEGER CMA wrote: This pt is all set to go, I called the pharmacy. Medicaid just wanted him to use up teh other Rx first, but they do have the new script.  All he needs to do is go pick up the new rx when he runs out of the metformin.  He has already started the amlodapine.  ---- 03/07/2007 2:49 PM, Lupita Raider MD wrote: [facility] and ensure that this pt can get a new rx for his increased dose of metformin (from one tablet two times a day to 2 tablets two times a day) and increased dose of amlodipine (from 5mg  to 10mg ).  He claims they told him medicaid wouldn't pay for the increased dose half way through the month but they should. Thanks! ------------------------------

## 2010-04-04 NOTE — Assessment & Plan Note (Signed)
Summary: fu/dm/shaw pt/el   Vital Signs:  Patient Profile:   55 Years Old Male Weight:      253.5 pounds Temp:     97.9 degrees F Pulse rate:   66 / minute BP sitting:   156 / 94  (left arm)  Pt. in pain?   yes    Location:   left leg    Intensity:   4    Type:       aching  Vitals Entered ByJacki Cones RN (April 07, 2007 8:59 AM)                  Chief Complaint:  f/u diabetes and bp.  History of Present Illness: 55yr old AAM with ?mental slowness with DM, HTN presents today for FU.  1) HTN- taking norvasc 10 mg each day. No ha/cp/sob/vision changes. Had been controlled on hctz/ace-i in past, but d/c'd due to vague muscle aches.  2) DM- supposed to be on metformin 1000 mg by mouth two times a day, but does not take every day.  He notes occasional vague muscle aches and "doesn't feel right" on medication. No GI se's.  No foot problems. He states last DM eye exam was in 04/2006.    He admits his diet has been poor over the past couple of weeks.  Fasting cbg's in the low 200's. No hypoglycemic episodes.  no polyuria/polydypsia.  3) L calf pain- 3-4 days of L calf pain. He denies specific injury.  Pain when pushing off. No swelling or warmth. No hx of dvt/pe.    ibuprofen resolves pain.  Current Allergies: No known allergies   Past Medical History:    Reviewed history from 02/05/2007 and no changes required:       - T2DM       - HTN       - ?MR       - h/o elevated LFTs in Feb 2008 with neg hep panel and neg abd [place] except for fatty liver - FU LFTS 2008 normal      Physical Exam  General:     Well-developed,well-nourished,in no acute distress; alert,appropriate and cooperative throughout examination Mouth:     pharynx pink and moist.   Neck:     no carotid bruits Lungs:     Normal respiratory effort, chest expands symmetrically. Lungs are clear to auscultation, no crackles or wheezes. Heart:     Normal rate and regular rhythm. S1 and S2 normal without  gallop, murmur, click, rub or other extra sounds. Abdomen:     Bowel sounds positive,abdomen soft and non-tender without masses, organomegaly or hernias noted. Extremities:     L calf non-tender. No swelling and equal in size to r calf. Negative homan's sign.  No cords or palpable masses.  5/5 plantar flexion. 2+ DTR's.    Diabetes Management Exam:    Foot Exam (with socks and/or shoes not present):       Sensory-Pinprick/Light touch:          Left medial foot (L-4): normal          Left dorsal foot (L-5): normal          Left lateral foot (S-1): normal          Right medial foot (L-4): normal          Right dorsal foot (L-5): normal          Right lateral foot (S-1): normal  Sensory-Monofilament:          Left foot: normal          Right foot: normal       Inspection:          Left foot: normal          Right foot: normal       Nails:          Left foot: normal          Right foot: normal    Impression & Recommendations:  Problem # 1:  CALF PAIN, LEFT (ICD-729.5) Assessment: New Likely calf strain. Con't supportive care w/ as needed ibuprofen, warm compresses.   Orders: [facility]- Est  Level 4 (16109)   Problem # 2:  HYPERTENSION, BENIGN SYSTEMIC (ICD-401.1) Assessment: Deteriorated Blood pressure above goal. Will add low dose ace-i and monitor closely for side effects. If doesn't tolerate consider using ARB.   The following medications were removed from the medication list:    Amlodipine Besylate 5 Mg Tabs (Amlodipine besylate) ..... One tablet daily  His updated medication list for this problem includes:    Amlodipine Besylate 10 Mg Tabs (Amlodipine besylate) ..... One tablet daily    Enalapril Maleate 5 Mg Tabs (Enalapril maleate) .Marland Kitchen... 1 by mouth once daily  Orders: Comp Met-[facility] (60454-09811) Direct LDL-[facility] (91478-29562) [facility]- Est  Level 4 (13086)   Problem # 3:  DIABETES MELLITUS II, UNCOMPLICATED (ICD-250.00) Assessment: Deteriorated A1c well above goal.  Stressed importance of taking metformin. Will add glipizide. If he doesn't tolerate oral meds may need to start lantus. Advised to get eye exam this month. Will check LDL to risk stratify. Recommended baby aspirin.  He declined flu shot today. His updated medication list for this problem includes:    Metformin Hcl 500 Mg Tabs (Metformin hcl) .Marland Kitchen..Marland Kitchen Two tablets by mouth two times a day    Enalapril Maleate 5 Mg Tabs (Enalapril maleate) .Marland Kitchen... 1 by mouth once daily    Glipizide Xl 5 Mg Tb24 (Glipizide) .Marland Kitchen... 1 by mouth once daily  Orders: A1C-[facility] (57846) Comp Met-[facility] (96295-28413) Direct LDL-[facility] (24401-02725) [facility]- Est  Level 4 (36644)   Complete Medication List: 1)  Onetouch Ultra Test Strp (Glucose blood) .... Use as directed 2)  Metformin Hcl 500 Mg Tabs (Metformin hcl) .... Two tablets by mouth two times a day 3)  Amlodipine Besylate 10 Mg Tabs (Amlodipine besylate) .... One tablet daily 4)  Enalapril Maleate 5 Mg Tabs (Enalapril maleate) .Marland Kitchen.. 1 by mouth once daily 5)  Glipizide Xl 5 Mg Tb24 (Glipizide) .Marland Kitchen.. 1 by mouth once daily   Patient Instructions: 1)  Diabetes- take metformin 2 pills twice daily.  Start glipizide 1 pill each morning. 2)   high blood pressure:  continue amlodipine 10 mg once daily.  Start enalapril 5 mg once daily 3)  follow up with dr. Seleta Rhymes or dr. Clelia Croft in 2-4 weeks 4)  schedule your diabetes eye exam    Prescriptions: GLIPIZIDE XL 5 MG TB24 (GLIPIZIDE) 1 by mouth once daily  #34 x 1   Entered and Authorized by:   Benn Moulder MD   Signed by:   Benn Moulder MD on 04/07/2007   Method used:   Electronically sent to ...       [facility] [address] [phone]*       [address]       [place], [place]  [address]  Ph: [phone] or [phone]       Fax: [phone]   RxID:   [id number] ENALAPRIL MALEATE 5 MG TABS (ENALAPRIL MALEATE) 1 by mouth once daily  #34 x 1   Entered and Authorized by:   Benn Moulder MD   Signed by:    Benn Moulder MD on 04/07/2007   Method used:   Electronically sent to ...       [facility] [address] [phone]*       [address]       [place], [place]  [address]       Ph: [phone] or [phone]       Fax: [phone]   RxID:   [id number]  ] Laboratory Results   Blood Tests   Date/Time Received: April 07, 2007 9:05 AM  Date/Time Reported: April 07, 2007 9:28 AM   HGBA1C: 10.5%   (Normal Range: Non-Diabetic - 3-6%   Control Diabetic - 6-8%)  Comments: ...................................................................DON[facility]  April 07, 2007 9:28 AM

## 2010-04-04 NOTE — Assessment & Plan Note (Signed)
Summary: f/u visit/bmc   Vital Signs:  Patient profile:   55 year old male Height:      71 inches Weight:      272.9 pounds BMI:     38.20 Temp:     98.3 degrees F oral Pulse rate:   87 / minute BP sitting:   176 / 105  (right arm) Cuff size:   large  Vitals Entered By: Garen Grams LPN (February 28, 2009 10:11 AM) CC: f/u htn and dm Is Patient Diabetic? Yes Did you bring your meter with you today? Yes Pain Assessment Patient in pain? no        CC:  f/u htn and dm.  History of Present Illness: 1. HTN:  Pt. has been taking the Benzapril as prescribed.  He thinks that he has missed about 10 days in the past month.  He doesn't know why he forgets it sometimes.  The medicine hasn't been causing him any problems.        ROS:  He denies any chest pain, shortness of breath, vision changes, headache  2. DMII: Pt has been taking his Lantus as prescribed.  He states that he takes 30 U twice a day.  He checks his sugars about once a day at random times.  It is usually above 300.  Lowest measurement was around 220.  He is not watching his diet and eats lots of sweats and drinks lots of sugary drinks.  He is aware of what foods to avoid and what is a good blood sugar number.       ROS: denies numbness or weakness  3. Obesity:  Pt. does not monitor his weight.  He doesn't eat a healthy diet.  He doesn't exercise.  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 One Meter 2)  Aspirin 81 Mg  Tbec (Aspirin) .... One By Mouth Every Day 3)  Cvs Insulin Syringe 29g X 1/2" 1 Ml Misc (Insulin Syringe-Needle U-100) .... Quantity For Once Daily Dosing - 1 Month Supply 4)  Prodigy Twist Top  Lancets 28g  Misc (Lancets) .... Use As Directed 5)  Prodigy Blood Glucose Test  Strp (Glucose Blood) .... Use As Directed 6)  Lantus 100 Unit/ml Soln (Insulin Glargine) .... Inject 36 Units Twice Daily. Dispense One Month Supply 7)  Multivitamins  Tabs (Multiple Vitamin) .... One Daily 8)  Benazepril-Hydrochlorothiazide 20-25 Mg Tabs (Benazepril-Hydrochlorothiazide) .... Take 1 Tab By Mouth Daily  Allergies: No Known Drug Allergies  Past History:  Past Medical History: Reviewed history from 10/29/2007 and no changes required. - T2DM - HTN - ?MR vs schizophrenia - nothing has been documented but pt often reacts to something in the room that I don't appreciate and refers to himself as "we" a lot. odd behaviors like making strange noises and washing hands in bleach - h/o elevated LFTs in Feb 2008 with neg hep panel and neg abd [place] except for fatty liver - FU LFTS 2008 normal  Social History: Reviewed history from 12/01/2008 and no changes required. Pt lives alone, Former 12 pack year smoker, former heavy drinker, no illicts; On disability--but pt won't elaborate. Has odd affect and ?MR vs schizoaffective d/o. Doesn't exercise.  Denies tobacco, alcohol, or drug  use  Physical Exam  General:  obese AAM,in no acute distress; alert,appropriate and cooperative throughout examination. has odd movements and facial expressions.  Head:  Normocephalic and atraumatic without obvious abnormalities. Eyes:  vision grossly intact, pupils equal, pupils round, pupils reactive to light, and no retinal abnormalitiies.   Mouth:  poor dentition.   Lungs:  normal respiratory effort, no crackles, and no wheezes.   Heart:  normal rate, regular rhythm, no murmur, and no gallop.   Abdomen:  Bowel sounds positive,abdomen soft and non-tender without masses Extremities:  no edema.  Psych:  normally interactive and good eye contact.  Has some odd facial movements and expressions Additional Exam:  CBGs reviewed from  his monitor:  Most are >300.   Impression & Recommendations:  Problem # 1:  DIABETES MELLITUS II, UNCOMPLICATED (ICD-250.00)  Uncontrolled.  Will continue with simplifed regimen of just Lantus.  Pt will not follow through with Pharmacy clinic.  Will increase Lantus to 36 U two times a day.  Follow up in 6 weeks His updated medication list for this problem includes:    Aspirin 81 Mg Tbec (Aspirin) ..... One by mouth every day    Lantus 100 Unit/ml Soln (Insulin glargine) ..... Inject 36 units twice daily. dispense one month supply    Benazepril-hydrochlorothiazide 20-25 Mg Tabs (Benazepril-hydrochlorothiazide) .Marland Kitchen... Take 1 tab by mouth daily  Orders: [facility]- Est  Level 4 (04540)  Problem # 2:  HYPERTENSION, BENIGN SYSTEMIC (ICD-401.1) Assessment: Unchanged  Not improved with the Benazepril.  Unsure of his compliance.  Will increase Benazepril and add HCTZ.  Follow up in 6 weeks, will check BMET at next visit. His updated medication list for this problem includes:    Benazepril-hydrochlorothiazide 20-25 Mg Tabs (Benazepril-hydrochlorothiazide) .Marland Kitchen... Take 1 tab by mouth daily  Orders: [facility]- Est  Level 4 (98119)  Problem # 3:  OBESITY, UNSPECIFIED (ICD-278.00) Assessment: Unchanged  Encouraged him to cut out sugary drinks and sweats  Orders: [facility]- Est  Level 4 (14782)  Complete Medication List: 1)  Prodigy Autocode Blood Glucose Devi (Blood glucose monitoring suppl) .... One meter 2)  Aspirin 81 Mg Tbec (Aspirin) .... One by mouth every day 3)  Cvs Insulin Syringe 29g X 1/2" 1 Ml Misc (Insulin syringe-needle u-100) .... Quantity for once daily dosing - 1 month supply 4)  Prodigy Twist Top Lancets 28g Misc (Lancets) .... Use as directed 5)  Prodigy Blood Glucose Test Strp (Glucose blood) .... Use as directed 6)  Lantus 100 Unit/ml Soln (Insulin glargine) .... Inject 36 units twice daily. dispense one month supply 7)  Multivitamins Tabs (Multiple vitamin) .... One daily 8)   Benazepril-hydrochlorothiazide 20-25 Mg Tabs (Benazepril-hydrochlorothiazide) .... Take 1 tab by mouth daily  Patient Instructions: 1)  Thank you for coming in today 2)  We need to get your blood pressure and diabetes under better control 3)  I am going to increase the strength of your blood pressure medicine, I sent the prescription to your pharmacy 4)  Start taking 36 Units of Lantus twice a day. 5)  Please schedule a follow up appt in 6 weeks 6)  We will do some blood work at your next visit Prescriptions: BENAZEPRIL-HYDROCHLOROTHIAZIDE 20-25 MG TABS (BENAZEPRIL-HYDROCHLOROTHIAZIDE) Take 1 tab by mouth daily  #30 x 3   Entered and Authorized by:   Angelena Sole MD   Signed by:   Angelena Sole MD on 02/28/2009   Method used:   [facility] to        [facility] [address] [phone]* (retail)       [address]       [place], [place]  [address]       Ph: [phone] or [phone]       Fax: [phone]   RxID:   [id number]   Prevention & Chronic Care Immunizations   Influenza vaccine: none available  (05/19/2008)   Influenza vaccine due: 05/19/2009    Tetanus booster: 10/03/2004: Done.   Tetanus booster due: 10/04/2014    Pneumococcal vaccine: Not documented  Colorectal Screening   Hemoccult: normal  (07/08/2007)   Hemoccult due: 07/07/2008    Colonoscopy: Diverticula. Medium hemorrhoids. Done at [facility] by Dr Elnoria Howard.  (08/28/2007)   Colonoscopy action/deferral: Repeat colonoscopy in 10 years.    (08/28/2007)   Colonoscopy due: 09/2017  Other Screening   PSA: 0.37  (07/08/2007)   PSA due due: 07/07/2008   Smoking status: never  (12/01/2008)  Diabetes Mellitus   HgbA1C: 13.1  (01/17/2009)   Hemoglobin A1C due: 08/13/2008    Eye exam: rare microaneurysm bilaterally  (06/20/2007)   Diabetic eye exam action/deferral: Ophthalmology referral  (12/01/2008)   Eye exam due: 06/19/2008    Foot exam: yes  (08/11/2007)    High risk foot: Not documented   Foot care education: completed  (05/19/2008)   Foot exam due: 08/10/2008    Urine microalbumin/creatinine ratio: Not documented   Urine microalbumin/cr due: Not Indicated    Diabetes flowsheet reviewed?: Yes   Progress toward A1C goal: Unchanged  Lipids   Total Cholesterol: 148  (12/01/2008)   Lipid panel action/deferral: Lipid Panel ordered   LDL: 71  (12/01/2008)   LDL Direct: 100  (04/07/2007)   HDL: 34  (12/01/2008)   Triglycerides: 215  (12/01/2008)  Hypertension   Last Blood Pressure: 176 / 105  (02/28/2009)   Serum creatinine: 1.26  (04/22/2008)   Serum potassium 4.0  (07/08/2008)    Hypertension flowsheet reviewed?: Yes   Progress toward BP goal: Unchanged  Self-Management Support :   Personal Goals (by the next clinic visit) :     Personal A1C goal: 8  (10/08/2008)     Personal blood pressure goal: 130/80  (10/08/2008)     Personal LDL goal: 100  (02/28/2009)    Diabetes self-management support: Written self-care plan  (10/26/2008)   Last diabetes self-management training by diabetes educator: completed    Hypertension self-management support: Written self-care plan  (10/26/2008)    Hypertension self-management support not done because: Good outcomes  (10/08/2008)

## 2010-04-04 NOTE — Assessment & Plan Note (Signed)
Summary: Rx clinic: DM follow up   Vital Signs:  Patient profile:   55 year old male Weight:      272 pounds Pulse rate:   85 / minute BP sitting:   126 / 84  History of Present Illness: 55 yo male presents for DM follow up.  He recently switched to Humalog mix 75/25 inulin.  His 7d average is 339.  His 14d average is 338 and his 28d average is 339.  He last ate last night.  Took his insulin this morning and his sugar reading is 309.  Walking every day for about an hour.     Current Medications (verified): 1)  Prodigy Autocode Blood Glucose  Devi (Blood Glucose Monitoring Suppl) .... One Meter 2)  Aspirin 81 Mg  Tbec (Aspirin) .... One By Mouth Every Day 3)  Cvs Insulin Syringe 29g X 1/2" 1 Ml Misc (Insulin Syringe-Needle U-100) .... Quantity For Once Daily Dosing - 1 Month Supply 4)  Prodigy Twist Top Lancets 28g  Misc (Lancets) .... Use As Directed 5)  Prodigy Blood Glucose Test  Strp (Glucose Blood) .... Use As Directed 6)  Lisinopril-Hydrochlorothiazide 20-12.5 Mg Tabs (Lisinopril-Hydrochlorothiazide) .... Take 2 Tablets Every Day 7)  Humalog Mix 75/25 75-25 % Susp (Insulin Lispro Prot & Lispro) .... Inject 35 Units Two Times A Day.  Once Before Breakfast and Once Before Dinner.  Allergies (verified): No Known Drug Allergies   Impression & Recommendations:  Problem # 1:  DIABETES MELLITUS II, UNCOMPLICATED (ICD-250.00)  DM not controlled.  Increased his Humalog mix to 40 units two times a day. Follow up in 2 to 3 weeks. Patient seen with Samuel Germany, pharmD.  TTFFC: 30 minutes His updated medication list for this problem includes:    Aspirin 81 Mg Tbec (Aspirin) ..... One by mouth every day    Lisinopril-hydrochlorothiazide 20-12.5 Mg Tabs (Lisinopril-hydrochlorothiazide) .Marland Kitchen... Take 2 tablets every day    Humalog Mix 75/25 75-25 % Susp (Insulin lispro prot & lispro) ..... Inject 40 units two times a day.  once before breakfast and once before  dinner.  Orders: Reassessment Each 15 min unit- [facility] ([address])  Problem # 2:  HYPERTENSION, BENIGN SYSTEMIC (ICD-401.1) blood pressure controlled. Complain of leg cramps.  Will check a potassium. His updated medication list for this problem includes:    Lisinopril-hydrochlorothiazide 20-12.5 Mg Tabs (Lisinopril-hydrochlorothiazide) .Marland Kitchen... Take 2 tablets every day  Orders: Potassium-[facility] ([address]11) Reassessment Each 15 min unit- [facility] ([address])  Complete Medication List: 1)  Prodigy Autocode Blood Glucose Devi (Blood glucose monitoring suppl) .... One meter 2)  Aspirin 81 Mg Tbec (Aspirin) .... One by mouth every day 3)  Cvs Insulin Syringe 29g X 1/2" 1 Ml Misc (Insulin syringe-needle u-100) .... Quantity for once daily dosing - 1 month supply 4)  Prodigy Twist Top Lancets 28g Misc (Lancets) .... Use as directed 5)  Prodigy Blood Glucose Test Strp (Glucose blood) .... Use as directed 6)  Lisinopril-hydrochlorothiazide 20-12.5 Mg Tabs (Lisinopril-hydrochlorothiazide) .... Take 2 tablets every day 7)  Humalog Mix 75/25 75-25 % Susp (Insulin lispro prot & lispro) .... Inject 40 units two times a day.  once before breakfast and once before dinner.  Patient Instructions: 1)  Please schedule a follow-up appointment in 2 to 3 weeks in pharmacy clinic.  2)  Check your blood sugars regularly. If your readings are usually above 300  or below 70 you should contact our office. 3)  Increase your Humalog mix insulin to 40 units two times a  day.  4)  I will call you next Thursday between 8:30 am and 12 pm to see how your sugars are doing.   5)  Continue your exercise. 6)  Continue to watch your diet.

## 2010-04-04 NOTE — Miscellaneous (Signed)
Summary: prevention update  Clinical Lists Changes      Last HGBA1C:  >14.0  (10/29/2007 4:02:21 PM) HGBA1C Result Date:  10/29/2007 HGBA1C Next Due:  3 mo

## 2010-04-04 NOTE — Letter (Signed)
Summary: Results Follow-up Letter  [facility]  [address]   [place], [place] [address]   Phone: [phone]  Fax: [phone]    04/08/2007  [address] [place], [place]  [address]  Dear Mr. WIK,   The following are the results of your recent test(s): __________________ Cholesterol LDL(Bad cholesterol):   100       Your goal is less than:  70       Your cholesterol is a little higher than it should be.  With improvement in your diet we may be able to avoid starting another medicine.  Watch the cholesterol content of your food and get plenty of exercise.  I look forward to seeing you again in march.      Sincerely,  Benn Moulder MD [facility]

## 2010-04-04 NOTE — Assessment & Plan Note (Signed)
Summary: F/U DM/[facility]   Vital Signs:  Patient profile:   55 year old male Weight:      274.7 pounds BMI:     36.13 Temp:     97.9 degrees F oral Pulse rate:   83 / minute BP sitting:   141 / 88  (left arm) Cuff size:   large  Vitals Entered By: Garen Grams LPN (May 19, 2008 2:53 PM)  Serial Vital Signs/Assessments:  Time      Position  BP       Pulse  Resp  Temp     By                     132/88                         Lupita Raider MD  CC: Diabetes Management, HTN, obesity Is Patient Diabetic? Yes   History of Present Illness: 55 yo male presents for:  1) HTN - Restarted on Lisinopril/HCTZ one week ago.  Reports not problems. Pt tolerating meds without side effects. Denies HA, vision changes, lightheadedness, syncope, dizziness, peripheral edema, orthopnea, SOB, or DOE. Able to start jogging now that weather is nicer.  2) Obesity - Has lost 2 lbs since last visit. he reports due to increased exercise now that the weather is nicer.   3) DM follow up.  States his blood sugars are running in the 300s.  Lowest 120s, highest 500s. His last A1c was 12. 7%.  Reports no hypoglycemia.  He is taking Lantus 30 qam and 25 q pm. He doesn't want to change his lantus currently due to "we just increased it." His lowest was 120 on the day he went jogging.   Habits & Providers     Tobacco Status: quit  Current Medications (verified): 1)  Prodigy Autocode Blood Glucose  Devi (Blood Glucose Monitoring Suppl) .... One Meter 2)  Glipizide Xl 10 Mg Tb24 (Glipizide) .... 2 Tablet By Mouth Daily 3)  Aspirin 81 Mg  Tbec (Aspirin) .... One By Mouth Every Day 4)  Lantus 100 Unit/ml Soln (Insulin Glargine) .... 35 Units in The Morning and 20 Units At Night.  Dispense One Month Supply 5)  Cvs Insulin Syringe 29g X 1/2" 1 Ml Misc (Insulin Syringe-Needle U-100) .... Quantity For Once Daily Dosing - 1 Month Supply 6)  Prodigy Twist Top Lancets 28g  Misc (Lancets) .... Use As Directed 7)  Prodigy Blood  Glucose Test  Strp (Glucose Blood) .... Use As Directed 8)  Lisinopril-Hydrochlorothiazide 20-12.5 Mg Tabs (Lisinopril-Hydrochlorothiazide) .... Take 2 Tablets Once A Day  Allergies (verified): No Known Drug Allergies PMH-FH-SH reviewed for relevance  Physical Exam  General:  obese AAM,in no acute distress; alert,appropriate and cooperative throughout examination. has odd movements and facial expressions.  Lungs:  Normal respiratory effort, chest expands symmetrically. Lungs are clear to auscultation, no crackles or wheezes. Heart:  Normal rate and regular rhythm. S1 and S2 normal without gallop, murmur, click, rub or other extra sounds. Pulses:  2+ dp and pt pulses Extremities:  no edema.    Impression & Recommendations:  Problem # 1:  HYPERTENSION, BENIGN SYSTEMIC (ICD-401.1) Assessment Improved  Continue current med.  Recheck at pharmacy visit.  ?compliance. Will need BMET at that visit to follow creatinine.  His updated medication list for this problem includes:    Lisinopril-hydrochlorothiazide 20-12.5 Mg Tabs (Lisinopril-hydrochlorothiazide) .Marland Kitchen... Take 2 tablets once a day  Orders: [facility]- Est  Level 4 ([address]214)  Problem # 2:  DIABETES MELLITUS II, UNCOMPLICATED (ICD-250.00) Assessment: Unchanged  I wanted to increase Lantus but he is very resistent.  Asked that he change to 35U qam and 20U qpm as prescribed.  He is agreeable to this. I think now that he is jogging more, hopefully this will also impact his CBGs in a positive way.  His updated medication list for this problem includes:    Glipizide Xl 10 Mg Tb24 (Glipizide) .Marland Kitchen... 2 tablet by mouth daily    Aspirin 81 Mg Tbec (Aspirin) ..... One by mouth every day    Lantus 100 Unit/ml Soln (Insulin glargine) .Marland KitchenMarland KitchenMarland KitchenMarland Kitchen 35 units in the morning and 20 units at night.  dispense one month supply    Lisinopril-hydrochlorothiazide 20-12.5 Mg Tabs (Lisinopril-hydrochlorothiazide) .Marland Kitchen... Take 2 tablets once a day  Orders: [facility]- Est  Level 4  ([address])  Problem # 3:  OBESITY, UNSPECIFIED (ICD-278.00) Assessment: Improved  Encouraged diet modifications and continued daily exercise.   Orders: [facility]- Est  Level 4 ([address])  Complete Medication List: 1)  Prodigy Autocode Blood Glucose Devi (Blood glucose monitoring suppl) .... One meter 2)  Glipizide Xl 10 Mg Tb24 (Glipizide) .... 2 tablet by mouth daily 3)  Aspirin 81 Mg Tbec (Aspirin) .... One by mouth every day 4)  Lantus 100 Unit/ml Soln (Insulin glargine) .... 35 units in the morning and 20 units at night.  dispense one month supply 5)  Cvs Insulin Syringe 29g X 1/2" 1 Ml Misc (Insulin syringe-needle u-100) .... Quantity for once daily dosing - 1 month supply 6)  Prodigy Twist Top Lancets 28g Misc (Lancets) .... Use as directed 7)  Prodigy Blood Glucose Test Strp (Glucose blood) .... Use as directed 8)  Lisinopril-hydrochlorothiazide 20-12.5 Mg Tabs (Lisinopril-hydrochlorothiazide) .... Take 2 tablets once a day  Patient Instructions: 1)  Please schedule a follow-up appointment in 2 weeks to see pharmacy clinic.  2)  Please schedule a follow-up with Dr Clelia Croft in 3 months.  3)  Check your blood sugars regularly. If your readings are usually above 300  or below 70 you should contact our office.  4)  It is important that your diabetic A1c level is checked every 3 months.   5)  Change your Lantus to 35 units in the morning and 20 units at night. 6)  The medication list was reviewed and reconciled.  All changed / newly prescribed medications were explained.  A complete medication list was provided to the patient / caregiver.      Flu Vaccine Result Date:  05/19/2008 Flu Vaccine Result:  none available Last HDL:  43 (07/08/2007 8:36:00 PM) HDL Next Due:  1 yr Last Diabetes Foot Check:  yes (08/11/2007 2:39:46 PM) Diabetes Foot Check Next Due:  1 yr Last HGBA1C:  12.7 (05/13/2008 10:16:52 AM) HGBA1C Next Due:  3 mo Microalbumin Next Due:  Not Indicated Foot Care Education Date:   05/19/2008 Foot Care Education:  completed Self-Mgt EDU Date:  05/19/2008 Self-Mgt EDU:  completed Last Creatinine:  1.26 (04/22/2008 8:29:00 PM) Creatinine Next Due: 6 mo Last Potassium:  4.3 (04/22/2008 8:29:00 PM) Potassium Next Due:  6 mo

## 2010-04-04 NOTE — Assessment & Plan Note (Signed)
Summary: F/U VISIT/[facility]   Vital Signs:  Patient Profile:   55 Years Old Male Weight:      247.2 pounds Temp:     98.2 degrees F oral Pulse rate:   70 / minute BP sitting:   166 / 94  (right arm)  Pt. in pain?   no  Vitals Entered By: Garen Grams LPN (March 07, 2007 2:34 PM)                  Chief Complaint:  DM and HTN FU.  History of Present Illness: 55yr old AAM with ?mental slowness with DM, HTN presents today for FU.  1) HTN - Pt had very nicely controlled BP on ACE/HCTZ combo but pt refused to continue taking either one 2/2 it caused him to have body aches.  On our last visit, we changed to amlodipine 5mg  daily which he has tolerated well without body aches.  He denies CP, SOB, dizziness, syncope, peripheral edema, orthopnea, or HA or vision changes.  2)  DM - Pt has always had a difficult time accepting that he has DM and needs to be on lifetime medication.  I have always had concerns about his compliance. AT our last visit, I increased his metformin to 1000mg  by mouth two times a day which he never ended up taking because he reports the pharmacy wouldn't fill it as it was too early per medicaid.  So, he had his old rx filled on 12/30 and has been taking only 500mg  two times a day. He denies side effects. He reports taking his sugars at home and they are running in the 200s.   Current Allergies (reviewed today): No known allergies   Past Medical History:    Reviewed history from 02/05/2007 and no changes required:       - T2DM       - HTN       - ?MR       - h/o elevated LFTs in Feb 2008 with neg hep panel and neg abd [place] except for fatty liver - FU LFTS 2008 normal   Social History:    Pt lives alone, Former 12 pack year smoker, former heavy drinker, no illicts; On disability--but pt won't elaborate. exercises 1hour daily with weight lighting and jogging.      Physical Exam  General:     Well-developed,well-nourished,in no acute distress; alert,appropriate  and cooperative throughout examination Head:     Normocephalic and atraumatic without obvious abnormalities. No apparent alopecia or balding. Eyes:     No corneal or conjunctival inflammation noted. EOMI. Perrla.  Vision grossly normal. Mouth:     MMM Lungs:     Normal respiratory effort, chest expands symmetrically. Lungs are clear to auscultation, no crackles or wheezes. Heart:     Normal rate and regular rhythm. S1 and S2 normal without gallop, murmur, click, rub or other extra sounds. Pulses:     2+ radial pulses Extremities:     no edema Neurologic:     alert & oriented X3 and gait normal.   Skin:     turgor normal and color normal.  Still with mildly dry hands.     Impression & Recommendations:  Problem # 1:  HYPERTENSION, BENIGN SYSTEMIC (ICD-401.1) Assessment: Deteriorated Increase amlodipine to 10mg  by mouth daily.  ?compliance. Stressed importance of this.  His updated medication list for this problem includes:     Amlodipine Besylate 10 Mg Tabs (Amlodipine besylate) ..... One tablet daily  His updated medication list for this problem includes:    Amlodipine Besylate 5 Mg Tabs (Amlodipine besylate) ..... One tablet daily    Amlodipine Besylate 10 Mg Tabs (Amlodipine besylate) ..... One tablet daily  Orders: [facility]- Est  Level 4 ([address])   Problem # 2:  DIABETES MELLITUS II, UNCOMPLICATED (ICD-250.00) Assessment: Deteriorated ? compliance. Stressed importance of taking his medications and calling me if he cannot obtain them from the pharmacy. Increased backed to 1g by mouth two times a day. Will [facility] ensure it is possible for him to get once his current bottle (of which he is taking a double dose) runs out. Will need A1C at next visit.   His updated medication list for this problem includes:    Metformin Hcl 500 Mg Tabs (Metformin hcl) .Marland Kitchen..Marland Kitchen Two tablets by mouth two times a day  Orders: [facility]- Est  Level 4 ([address])   Complete Medication List: 1)  Onetouch  Ultra Test Strp (Glucose blood) .... Use as directed 2)  Metformin Hcl 500 Mg Tabs (Metformin hcl) .... Two tablets by mouth two times a day 3)  Amlodipine Besylate 5 Mg Tabs (Amlodipine besylate) .... One tablet daily 4)  Amlodipine Besylate 10 Mg Tabs (Amlodipine besylate) .... One tablet daily   Patient Instructions: 1)  Please schedule an appointment with Dr Clelia Croft in 4 weeks to check your blood pressure and sugar. 2)  Increase your metformin to 1000mg  (2 tablets) twice a day.   3)  Increase your amlodipine to 10mg  (2tablets) once a day.   4)  [facility] and make sure it won't be a problem for you to pick up the new prescription.  5)  Have a happy New Year!    Prescriptions: AMLODIPINE BESYLATE 10 MG  TABS (AMLODIPINE BESYLATE) one tablet daily  #30 x 3   Entered and Authorized by:   Lupita Raider MD   Signed by:   Lupita Raider MD on 03/07/2007   Method used:   [facility] to ...       [facility] [address] [phone]*       [address]       [place], [place]  [address]       Ph: [phone] or [phone]       Fax: [phone]   RxID:   [id number] METFORMIN HCL 500 MG  TABS (METFORMIN HCL) two tablets by mouth two times a day  #120 x 3   Entered and Authorized by:   Lupita Raider MD   Signed by:   Lupita Raider MD on 03/07/2007   Method used:   [facility]ronically sent to ...       [facility] [address] [phone]*       [address]       [place], [place]  [address]       Ph: [phone] or [phone]       Fax: [phone]   RxID:   [id number]  ]

## 2010-04-04 NOTE — Assessment & Plan Note (Signed)
Summary: Diabetes - Rx Clinic   Vital Signs:  Patient Profile:   55 Years Old Male Height:     73.25 inches Weight:      272 pounds Pulse rate:   84 / minute BP sitting:   143 / 86                   Current Allergies: No known allergies         Impression & Recommendations:  Problem # 1:  DIABETES MELLITUS II, UNCOMPLICATED (ICD-250.00) A1C remains > 9 due to dietary indescretion and variable exercise regimen. Patient appears adherent with therapy.  Willing to consider exercisng more to improve blood sugar control.   Patient willing to increase Lantus to 40 units prior to each meal AND decrease his Lantus to 20 units if he does NOT plan to eat for the day.  He is willing to try and obtain food from [facility] rather than skipping meals at the end of the month.  He may need meal time insulin soon.  At this time  will continue to work on increasing just his Basal insulin.  TTPPC: 55 minutes.  New Rx for Lnatus called in for patient.  Also sent electronically. His updated medication list for this problem includes:    Metformin Hcl 1000 Mg Tabs (Metformin hcl) .Marland Kitchen... Take 1 tablet by mouth two times a day    Enalapril Maleate 20 Mg Tabs (Enalapril maleate) .Marland Kitchen... 1 tablet daily    Glipizide Xl 10 Mg Tb24 (Glipizide) .Marland Kitchen... 2 tablet by mouth daily    Aspirin 81 Mg Tbec (Aspirin) ..... One by mouth every day    Lantus 100 Unit/ml Soln (Insulin glargine) .Marland KitchenMarland KitchenMarland KitchenMarland Kitchen 40 units once daily in the morning. dispense 1 month supply  Orders: A1C-[facility] ([address]) Reassessment Each 15 min unit- [facility] ([address])   Complete Medication List: 1)  Onetouch Ultra Test Strp (Glucose blood) .... Test once daily before breakfast.  may substitute brands if necessary. 2)  Metformin Hcl 1000 Mg Tabs (Metformin hcl) .... Take 1 tablet by mouth two times a day 3)  Amlodipine Besylate 10 Mg Tabs (Amlodipine besylate) .... One tablet daily 4)  Enalapril Maleate 20 Mg Tabs (Enalapril maleate) .Marland Kitchen.. 1 tablet daily 5)   Glipizide Xl 10 Mg Tb24 (Glipizide) .... 2 tablet by mouth daily 6)  Aspirin 81 Mg Tbec (Aspirin) .... One by mouth every day 7)  Lantus 100 Unit/ml Soln (Insulin glargine) .... 40 units once daily in the morning. dispense 1 month supply 8)  Cvs Insulin Syringe 29g X 1/2" 1 Ml Misc (Insulin syringe-needle u-100) .... Quantity for once daily dosing - 1 month supply   Patient Instructions: 1)  Exercise every day if possible 2)  Try to eat a smaller meal. 3)  Take Lantus 40 units each morning AND decrease this dose to 20 units if you plan TO NOT EAT for the day. 4)  Return visit to RX clinic in 2-3 weeks   Prescriptions: LANTUS 100 UNIT/ML SOLN (INSULIN GLARGINE) 40 units once daily in the morning. Dispense 1 month supply  #1 x 11   Entered by:   Madelon Lips PHARMD   Authorized by:   Lupita Raider MD   Signed by:   Madelon Lips PHARMD on 02/09/2008   Method used:   [facility]        [facility] [address] [phone]* (retail)       [address]  [place], [place]  [address]       Ph: [phone] or [phone]       Fax: [phone]   RxID:   [id number]  ] Laboratory Results   Blood Tests   Date/Time Received: February 09, 2008 10:45 AM  Date/Time Reported: February 09, 2008 11:19 AM   HGBA1C: 10.6%   (Normal Range: Non-Diabetic - 3-6%   Control Diabetic - 6-8%)  Comments: ...............test performed by......Marland KitchenBonnie A. [place], [place] (ASCP)      Appended Document: Diabetes - Rx Clinic The Lantus dose if 40 units each morning (not prior to each meal).  He will decrease this to 20 units IF he has no food for the day.

## 2010-04-04 NOTE — Assessment & Plan Note (Signed)
Summary: f/u eo   Vital Signs:  Patient profile:   55 year old male Height:      71 inches Weight:      259.2 pounds BMI:     36.28 Temp:     98.0 degrees F oral Pulse rate:   85 / minute BP sitting:   120 / 77  (left arm) Cuff size:   large  Vitals Entered By: Garen Grams LPN (May 24, 2009 2:08 PM) CC: f/u dm and bp Is Patient Diabetic? Yes Did you bring your meter with you today? Yes Pain Assessment Patient in pain? no        CC:  f/u dm and bp.  History of Present Illness: 1. DMII: Pt is taking his Insulin as prescribed.  He does however miss 4 doses in a week.  He is afraid of it dropping too much.  He has started jogging and one day he took a full 40 U before a job and felt terrible.  He is checking his blood sugars at home about once a day.  They are usually between 300-400.      ROS: denies vision changes, skin breakdown  2. HTN: Pt is taking the new blood pressure medicine.  He thinks that it is also causing him to be weak.  He misses a couple doses a week.  He doesn't check his blood pressure at home regularly.      ROS: denies chest pain, shortness of breath  3. Obesity:  Has really changed his diet.  Still eating a lot of fast / junk food.  He hasn't limited his sugars / carbs.  He does try to eat less than before.  He has also bee jogging.  Sometimes he can job for up to 2 miles.  Habits & Providers  Alcohol-Tobacco-Diet     Tobacco Status: never  Current Medications (verified): 1)  Prodigy Autocode Blood Glucose  Devi (Blood Glucose Monitoring Suppl) .... One Meter 2)  Aspirin 81 Mg  Tbec (Aspirin) .... One By Mouth Every Day 3)  Cvs Insulin Syringe 29g X 1/2" 1 Ml Misc (Insulin Syringe-Needle U-100) .... Quantity For Once Daily Dosing - 1 Month Supply 4)  Prodigy Twist Top Lancets 28g  Misc (Lancets) .... Use As Directed 5)  Prodigy Blood Glucose Test  Strp (Glucose Blood) .... Use As Directed 6)  Lantus 100 Unit/ml Soln (Insulin Glargine) .... Inject 40  Units Twice Daily. Dispense One Month Supply 7)  Multivitamins  Tabs (Multiple Vitamin) .... One Daily 8)  Losartan Potassium-Hctz 50-12.5 Mg Tabs (Losartan Potassium-Hctz) .... Take 1 Tab By Mouth Daily  Allergies: No Known Drug Allergies  Past History:  Past Medical History: - T2DM - HTN - ?schizophrenia - nothing has been documented but pt often reacts to something in the room that I don't appreciate and refers to himself as "we" a lot. odd behaviors like making strange noises and washing hands in bleach - h/o elevated LFTs in Feb 2008 with neg hep panel and neg abd [place] except for fatty liver - FU LFTS 2008 normal  Social History: Reviewed history from 12/01/2008 and no changes required. Pt lives alone, Former 12 pack year smoker, former heavy drinker, no illicts; On disability--but pt won't elaborate. Jogs for exercise.  Denies tobacco, alcohol, or drug use  Physical Exam  General:  Vitals reviewed. obese AAM,in no acute distress; alert,appropriate and cooperative throughout examination. has odd movements and facial expressions.  Head:  Normocephalic and atraumatic without obvious  abnormalities. Eyes:  vision grossly intact, pupils equal, pupils round, pupils reactive to light, and no retinal abnormalitiies.   Neck:  no carotid bruits Lungs:  normal respiratory effort, no crackles, and no wheezes.   Heart:  normal rate, regular rhythm, no murmur, and no gallop.   Abdomen:  Bowel sounds positive,abdomen soft and non-tender without masses Extremities:  no edema.  Neurologic:  alert & oriented X3 and gait normal.   Psych:  normally interactive and good eye contact.  Has some odd facial movements and expressions   Impression & Recommendations:  Problem # 1:  HYPERTENSION, BENIGN SYSTEMIC (ICD-401.1) Assessment Improved  Continue current medications His updated medication list for this problem includes:    Losartan Potassium-hctz 50-12.5 Mg Tabs (Losartan potassium-hctz) .Marland Kitchen...  Take 1 tab by mouth daily  Orders: [facility]- Est  Level 4 ([address]109)  Problem # 2:  DIABETES MELLITUS II, UNCOMPLICATED (ICD-250.00) Assessment: Unchanged  Encouraged him to take his Insulin twice a day every day for the next couple of weeks.  He does miss a couple of doses a week because he goes out for a job and he is afraid that it will cause his blood sugars to get too low.  Told him to take only 20 U at that time. His updated medication list for this problem includes:    Aspirin 81 Mg Tbec (Aspirin) ..... One by mouth every day    Lantus 100 Unit/ml Soln (Insulin glargine) ..... Inject 40 units twice daily. dispense one month supply    Losartan Potassium-hctz 50-12.5 Mg Tabs (Losartan potassium-hctz) .Marland Kitchen... Take 1 tab by mouth daily  Orders: [facility]- Est  Level 4 (60454)  Problem # 3:  OBESITY, UNSPECIFIED (ICD-278.00) Assessment: Improved  Continue with exercise and diet modification  Orders: [facility]- Est  Level 4 (09811)  Complete Medication List: 1)  Prodigy Autocode Blood Glucose Devi (Blood glucose monitoring suppl) .... One meter 2)  Aspirin 81 Mg Tbec (Aspirin) .... One by mouth every day 3)  Cvs Insulin Syringe 29g X 1/2" 1 Ml Misc (Insulin syringe-needle u-100) .... Quantity for once daily dosing - 1 month supply 4)  Prodigy Twist Top Lancets 28g Misc (Lancets) .... Use as directed 5)  Prodigy Blood Glucose Test Strp (Glucose blood) .... Use as directed 6)  Lantus 100 Unit/ml Soln (Insulin glargine) .... Inject 40 units twice daily. dispense one month supply 7)  Multivitamins Tabs (Multiple vitamin) .... One daily 8)  Losartan Potassium-hctz 50-12.5 Mg Tabs (Losartan potassium-hctz) .... Take 1 tab by mouth daily  Patient Instructions: 1)  It was good to see you again 2)  Good job on the weight loss, keep up the good work 3)  Your blood pressure also looks great! 4)  I think that we need to get your blood sugars better controlled 5)  I need you to take your insulin everyday twice  a day 6)  On the days that you are planning on going jogging just take 20 U of Insulin.  On all other days take 40 U twice a day. 7)  Please schedule a follow up appointment in 3-4 weeks for your diabetes.  Prevention & Chronic Care Immunizations   Influenza vaccine: none available  (05/19/2008)   Influenza vaccine due: 05/19/2009    Tetanus booster: 10/03/2004: Done.   Tetanus booster due: 10/04/2014    Pneumococcal vaccine: Not documented  Colorectal Screening   Hemoccult: normal  (07/08/2007)   Hemoccult due: 07/07/2008    Colonoscopy: Diverticula. Medium hemorrhoids. Done  at [facility] by Dr Elnoria Howard.  (08/28/2007)   Colonoscopy action/deferral: Repeat colonoscopy in 10 years.    (08/28/2007)   Colonoscopy due: 09/2017  Other Screening   PSA: 0.37  (07/08/2007)   PSA due due: 07/07/2008   Smoking status: never  (05/24/2009)  Diabetes Mellitus   HgbA1C: >14.0  (04/11/2009)   Hemoglobin A1C due: 08/13/2008    Eye exam: rare microaneurysm bilaterally  (06/20/2007)   Diabetic eye exam action/deferral: Ophthalmology referral  (12/01/2008)   Eye exam due: 06/19/2008    Foot exam: yes  (08/11/2007)   High risk foot: Not documented   Foot care education: completed  (05/19/2008)   Foot exam due: 08/10/2008    Urine microalbumin/creatinine ratio: Not documented   Urine microalbumin/cr due: Not Indicated    Diabetes flowsheet reviewed?: Yes   Progress toward A1C goal: Unchanged  Lipids   Total Cholesterol: 148  (12/01/2008)   Lipid panel action/deferral: Lipid Panel ordered   LDL: 71  (12/01/2008)   LDL Direct: 100  (04/07/2007)   HDL: 34  (12/01/2008)   Triglycerides: 215  (12/01/2008)  Hypertension   Last Blood Pressure: 120 / 77  (05/24/2009)   Serum creatinine: 1.26  (04/22/2008)   Serum potassium 4.0  (07/08/2008)    Hypertension flowsheet reviewed?: Yes   Progress toward BP goal: Improved  Self-Management Support :   Personal Goals (by the  next clinic visit) :     Personal A1C goal: 10  (05/24/2009)     Personal blood pressure goal: 130/80  (10/08/2008)     Personal LDL goal: 100  (02/28/2009)    Diabetes self-management support: Written self-care plan  (10/26/2008)   Last diabetes self-management training by diabetes educator: completed    Hypertension self-management support: Written self-care plan  (10/26/2008)    Hypertension self-management support not done because: Good outcomes  (10/08/2008)

## 2010-04-04 NOTE — Assessment & Plan Note (Signed)
Summary: F/U LAST VISIT/tcb   Vital Signs:  Patient profile:   55 year old male Height:      71 inches Weight:      272 pounds BMI:     38.07 BSA:     2.41 Temp:     97.8 degrees F Pulse rate:   89 / minute BP sitting:   166 / 102  Vitals Entered By: Jone Baseman CMA (January 17, 2009 11:06 AM) CC: HTN and DM Is Patient Diabetic? Yes Did you bring your meter with you today? No Pain Assessment Patient in pain? yes     Location: left leg Intensity: 3   CC:  HTN and DM.  History of Present Illness: 1. HTN: pt. is not taking the Lisinopril-HCTZ because he thinks that it is causing his left leg to hurt.  He stopped taking it weeks ago and since then his left leg has felt better.   He doesn't check his blood pressure regularly.   ROS:  Denies any chest pain, vision changes, headache  2. DMII: Pt. is not taking his insulin as prescribed.  He is taking 30 U of Lantus in the morning and then 40 U of Novolog in the evening.  He does check his blood sugars twice a day.  They range from 100-600.  He doesn't watch his diet and sometimes doesn't eat much and sometimes eats a very large meal.  He doesn't take the two types of Insulin as prescribed because he thinks that it makes him feel "funny".  He didn't make an appt. with the pharmacy clinic as directed at last appt.  ROS:  Denies numbness or tingling  Current Medications (verified): 1)  Prodigy Autocode Blood Glucose  Devi (Blood Glucose Monitoring Suppl) .... One Meter 2)  Aspirin 81 Mg  Tbec (Aspirin) .... One By Mouth Every Day 3)  Cvs Insulin Syringe 29g X 1/2" 1 Ml Misc (Insulin Syringe-Needle U-100) .... Quantity For Once Daily Dosing - 1 Month Supply 4)  Prodigy Twist Top Lancets 28g  Misc (Lancets) .... Use As Directed 5)  Prodigy Blood Glucose Test  Strp (Glucose Blood) .... Use As Directed 6)  Lantus 100 Unit/ml Soln (Insulin Glargine) .... Inject 30 Units Twice Daily. Dispense One Month Supply 7)  Multivitamins  Tabs  (Multiple Vitamin) .... One Daily 8)  Lotensin 10 Mg Tabs (Benazepril Hcl) .... Take 1 Tab By Mouth Daily  Allergies: No Known Drug Allergies  Past History:  Past Medical History: Reviewed history from 10/29/2007 and no changes required. - T2DM - HTN - ?MR vs schizophrenia - nothing has been documented but pt often reacts to something in the room that I don't appreciate and refers to himself as "we" a lot. odd behaviors like making strange noises and washing hands in bleach - h/o elevated LFTs in Feb 2008 with neg hep panel and neg abd [place] except for fatty liver - FU LFTS 2008 normal  Social History: Reviewed history from 12/01/2008 and no changes required. Pt lives alone, Former 12 pack year smoker, former heavy drinker, no illicts; On disability--but pt won't elaborate. Has odd affect and ?MR vs schizoaffective d/o. Doesn't exercise.  Denies tobacco, alcohol, or drug use  Physical Exam  General:  obese AAM,in no acute distress; alert,appropriate and cooperative throughout examination. has odd movements and facial expressions.  Head:  Normocephalic and atraumatic without obvious abnormalities. Eyes:  vision grossly intact, pupils equal, pupils round, pupils reactive to light, and no retinal abnormalitiies.  Mouth:  poor dentition.   Lungs:  normal respiratory effort, no crackles, and no wheezes.   Heart:  normal rate, regular rhythm, no murmur, and no gallop.   Abdomen:  Bowel sounds positive,abdomen soft and non-tender without masses Extremities:  no edema.  Psych:  normally interactive and good eye contact.  Has some odd facial movements and expressions   Impression & Recommendations:  Problem # 1:  HYPERTENSION, BENIGN SYSTEMIC (ICD-401.1) Assessment Unchanged  Blood pressure elevated because he hasn't been taking his medications.  It doesn't sounds like he has a true reaction, but comes up with reasons not to take his medicines.  Stopped Lisinopril because it made him feel  funny, stopped Enalapril because it made his left leg hurt.  Still believe that ACEI is best option for him.  Will try Benazepril His updated medication list for this problem includes:    Lotensin 10 Mg Tabs (Benazepril hcl) .Marland Kitchen... Take 1 tab by mouth daily  Orders: [facility]- Est Level  3 (62130)  Problem # 2:  DIABETES MELLITUS II, UNCOMPLICATED (ICD-250.00) Assessment: Unchanged Poorly controlled.  Will simplify regimen because I do not believe he understands the purpose of the different insulins.  Will go to just Lantus, twice a day. The following medications were removed from the medication list:    Novolog 100 Unit/ml Soln (Insulin aspart) .Marland KitchenMarland KitchenMarland KitchenMarland Kitchen 30 units twice daily prior to meals. dispense one month supply. His updated medication list for this problem includes:    Aspirin 81 Mg Tbec (Aspirin) ..... One by mouth every day    Lantus 100 Unit/ml Soln (Insulin glargine) ..... Inject 30 units twice daily. dispense one month supply    Lotensin 10 Mg Tabs (Benazepril hcl) .Marland Kitchen... Take 1 tab by mouth daily  Orders: A1C-[facility] (86578) [facility]- Est Level  3 (46962)  Complete Medication List: 1)  Prodigy Autocode Blood Glucose Devi (Blood glucose monitoring suppl) .... One meter 2)  Aspirin 81 Mg Tbec (Aspirin) .... One by mouth every day 3)  Cvs Insulin Syringe 29g X 1/2" 1 Ml Misc (Insulin syringe-needle u-100) .... Quantity for once daily dosing - 1 month supply 4)  Prodigy Twist Top Lancets 28g Misc (Lancets) .... Use as directed 5)  Prodigy Blood Glucose Test Strp (Glucose blood) .... Use as directed 6)  Lantus 100 Unit/ml Soln (Insulin glargine) .... Inject 30 units twice daily. dispense one month supply 7)  Multivitamins Tabs (Multiple vitamin) .... One daily 8)  Lotensin 10 Mg Tabs (Benazepril hcl) .... Take 1 tab by mouth daily  Patient Instructions: 1)  for your blood pressure lets try Lotensin, let me know if it gives your problems 2)  For your diabetes lets simplify.  Only take the Lantus 30  U in the morning and 30 U in the evening 3)  Please schedule an appt. in 6 weeks to follow up on your blood pressure and diabetes Prescriptions: LOTENSIN 10 MG TABS (BENAZEPRIL HCL) Take 1 tab by mouth daily  #30 x 3   Entered and Authorized by:   Angelena Sole MD   Signed by:   Angelena Sole MD on 01/17/2009   Method used:   Electronically to        [facility] [address] [phone]* (retail)       [address]       [place], [place]  [address]       Ph: [phone] or [phone]  Fax: [phone]   RxID:   [id number]   Laboratory Results   Blood Tests   Date/Time Received: January 17, 2009 10:58 AM  Date/Time Reported: January 17, 2009 11:15 AM   HGBA1C: 13.1%   (Normal Range: Non-Diabetic - 3-6%   Control Diabetic - 6-8%)  Comments: ...........test performed by...........Marland KitchenTerese Door, CMA      Prevention & Chronic Care Immunizations   Influenza vaccine: none available  (05/19/2008)   Influenza vaccine due: 05/19/2009    Tetanus booster: 10/03/2004: Done.   Tetanus booster due: 10/04/2014    Pneumococcal vaccine: Not documented  Colorectal Screening   Hemoccult: normal  (07/08/2007)   Hemoccult due: 07/07/2008    Colonoscopy: Diverticula. Medium hemorrhoids. Done at [facility] by Dr Elnoria Howard.  (08/28/2007)   Colonoscopy action/deferral: Repeat colonoscopy in 10 years.    (08/28/2007)   Colonoscopy due: 09/2017  Other Screening   PSA: 0.37  (07/08/2007)   PSA due due: 07/07/2008   Smoking status: never  (12/01/2008)  Diabetes Mellitus   HgbA1C: 13.1  (01/17/2009)   Hemoglobin A1C due: 08/13/2008    Eye exam: rare microaneurysm bilaterally  (06/20/2007)   Diabetic eye exam action/deferral: Ophthalmology referral  (12/01/2008)   Eye exam due: 06/19/2008    Foot exam: yes  (08/11/2007)   High risk foot: Not documented   Foot care education: completed  (05/19/2008)   Foot exam due: 08/10/2008     Urine microalbumin/creatinine ratio: Not documented   Urine microalbumin/cr due: Not Indicated    Diabetes flowsheet reviewed?: Yes   Progress toward A1C goal: Improved  Lipids   Total Cholesterol: 148  (12/01/2008)   Lipid panel action/deferral: Lipid Panel ordered   LDL: 71  (12/01/2008)   LDL Direct: 100  (04/07/2007)   HDL: 34  (12/01/2008)   Triglycerides: 215  (12/01/2008)  Hypertension   Last Blood Pressure: 166 / 102  (01/17/2009)   Serum creatinine: 1.26  (04/22/2008)   Serum potassium 4.0  (07/08/2008)    Hypertension flowsheet reviewed?: Yes   Progress toward BP goal: Unchanged  Self-Management Support :   Personal Goals (by the next clinic visit) :     Personal A1C goal: 8  (10/08/2008)     Personal blood pressure goal: 130/80  (10/08/2008)     Personal LDL goal: 70  (10/08/2008)    Diabetes self-management support: Written self-care plan  (10/26/2008)   Last diabetes self-management training by diabetes educator: completed    Hypertension self-management support: Written self-care plan  (10/26/2008)    Hypertension self-management support not done because: Good outcomes  (10/08/2008)

## 2010-04-04 NOTE — Assessment & Plan Note (Signed)
Summary: Rx clinic: DM follow up   Vital Signs:  Patient profile:   55 year old male Height:      71 inches Weight:      271 pounds Pulse rate:   83 / minute BP sitting:   136 / 92   History of Present Illness: 55 yo male presents for DM follow up.  He reports he is taking all his meds.  He is walking almost every day for about 2 hours.  He has lost about 2 pounds since his last visit.  He reports his thrist has improved.  He is getting up only once to go to the bathroom routinely.  Although there are times he still gets up three times a night to go to the bathroom.  His blood sugars 7 day average is 36.  Both mornings and afternoon readings are in the 300's.  Lowest number is 150 on 4/19.  His diet is not good, still eating more than he should.  He is trying to incorporate vegatables as discussed at the last visit (lettuce, tomatoes, green beans).  He is eating canned fish, boneless chicken. Reports too much bread and cookies.    Current Medications (verified): 1)  Prodigy Autocode Blood Glucose  Devi (Blood Glucose Monitoring Suppl) .... One Meter 2)  Glipizide Xl 10 Mg Tb24 (Glipizide) .... 2 Tablet By Mouth Daily 3)  Aspirin 81 Mg  Tbec (Aspirin) .... One By Mouth Every Day 4)  Lantus 100 Unit/ml Soln (Insulin Glargine) .... 40 Units in The Morning and 25 Units At Night.  Dispense One Month Supply 5)  Cvs Insulin Syringe 29g X 1/2" 1 Ml Misc (Insulin Syringe-Needle U-100) .... Quantity For Once Daily Dosing - 1 Month Supply 6)  Prodigy Twist Top Lancets 28g  Misc (Lancets) .... Use As Directed 7)  Prodigy Blood Glucose Test  Strp (Glucose Blood) .... Use As Directed 8)  Lisinopril-Hydrochlorothiazide 20-12.5 Mg Tabs (Lisinopril-Hydrochlorothiazide) .... Take 2 Tablets Every Day  Allergies (verified): No Known Drug Allergies   Impression & Recommendations:  Problem # 1:  DIABETES MELLITUS II, UNCOMPLICATED (ICD-250.00) Assessment Unchanged  DM remains uncontrolled.  Patient is  almost finished with his lantus bottle and glipizide, may have 2 to 4 days worth.  Once those are completed, he will stop them.  He will start Humalog 75/25 mix 35 units two times a day.  He will continue to check his blood sugars twice a day.  Follow up in Pharmacy clinic in 2 weeks.  Patient seen with Angeline Slim, MD, Gala Lewandowsky, PharmD, Lilia Pro, PharmD candidate.   The following medications were removed from the medication list:    Glipizide Xl 10 Mg Tb24 (Glipizide) .Marland Kitchen... 2 tablet by mouth daily    Lantus 100 Unit/ml Soln (Insulin glargine) .Marland KitchenMarland KitchenMarland KitchenMarland Kitchen 40 units in the morning and 25 units at night.  dispense one month supply His updated medication list for this problem includes:    Aspirin 81 Mg Tbec (Aspirin) ..... One by mouth every day    Lisinopril-hydrochlorothiazide 20-12.5 Mg Tabs (Lisinopril-hydrochlorothiazide) .Marland Kitchen... Take 2 tablets every day    Humalog Mix 75/25 75-25 % Susp (Insulin lispro prot & lispro) ..... Inject 35 units two times a day.  once before breakfast and once before dinner.  Orders: Reassessment Each 15 min unit- [facility] (13086)  Problem # 2:  HYPERTENSION, BENIGN SYSTEMIC (ICD-401.1) Assessment: Deteriorated  BP elevated today but patient reports not taking his medication this morning.   His updated medication list for  this problem includes:    Lisinopril-hydrochlorothiazide 20-12.5 Mg Tabs (Lisinopril-hydrochlorothiazide) .Marland Kitchen... Take 2 tablets every day  [facility]  Complete Medication List: 1)  Prodigy Autocode Blood Glucose Devi (Blood glucose monitoring suppl) .... One meter 2)  Aspirin 81 Mg Tbec (Aspirin) .... One by mouth every day 3)  Cvs Insulin Syringe 29g X 1/2" 1 Ml Misc (Insulin syringe-needle u-100) .... Quantity for once daily dosing - 1 month supply 4)  Prodigy Twist Top Lancets 28g Misc (Lancets) .... Use as directed 5)  Prodigy Blood Glucose Test Strp (Glucose blood) .... Use as directed 6)   Lisinopril-hydrochlorothiazide 20-12.5 Mg Tabs (Lisinopril-hydrochlorothiazide) .... Take 2 tablets every day 7)  Humalog Mix 75/25 75-25 % Susp (Insulin lispro prot & lispro) .... Inject 35 units two times a day.  once before breakfast and once before dinner.  Patient Instructions: 1)  Please schedule a follow-up appointment in 2 weeks with pharmacy clinic.  2)  Check your blood sugars regularly. If your readings are usually above 300 or below 70 you should contact our office. Continue to check two times a day. 3)  Finish the Lantus in the bottle and then stop it.  Do not refill it. 4)  After your Lantus is done, start Humalog 70/30 insulin.  Take 35 units before breakfast and 35 units before dinner. 5)  Finish the glipizide in your bottle and then stop it (do not refill). 6)  Make sure you eat when you take your insulin. Prescriptions: HUMALOG MIX 75/25 75-25 % SUSP (INSULIN LISPRO PROT & LISPRO) Inject 35 units two times a day.  Once before breakfast and once before dinner.  #3 x 6   Entered by:   Steve Rattler  Pharm D   Authorized by:   Lupita Raider MD   Signed by:   Steve Rattler  Pharm D on 06/24/2008   Method used:   [facility]        [facility]  [address] [phone]* (retail)       [address]       [place], [place]  [address]       Ph: [phone] or [phone]       Fax: [phone]   RxID:   [id number] LISINOPRIL-HYDROCHLOROTHIAZIDE 20-12.5 MG TABS (LISINOPRIL-HYDROCHLOROTHIAZIDE) Take 2 tablets every day  #60 x 5   Entered and Authorized by:   Murvin Natal D   Signed by:   Steve Rattler  Pharm D on 06/24/2008   Method used:   Historical   RxID:   [id number]

## 2010-04-04 NOTE — Assessment & Plan Note (Signed)
Summary: f/u/el   Vital Signs:  Patient Profile:   55 Years Old Male Weight:      272 pounds Temp:     97.2 degrees F Pulse rate:   66 / minute BP sitting:   159 / 93  Pt. in pain?   no  Vitals Entered By: Jone Baseman CMA (July 02, 2006 8:35 AM)                Chief Complaint:  F/U.  History of Present Illness: 1. DM - pt still exercising and trying to eat mod CHO; no polydypsia, poluria 2. HTN- not taking Rx b/c "doesn't feel good on them"; have tried several different BP Rxs      Risk Factors:  Tobacco use:  never    Physical Exam  General:     alert, well-developed, well-nourished, well-hydrated, and overweight-appearing.   Head:     Normocephalic and atraumatic without obvious abnormalities. No apparent alopecia or balding. Eyes:     pupils equal and pupils round.   Ears:     no external deformities.   Nose:     no external deformity and no nasal discharge.   Mouth:     teeth missing.   Lungs:     Normal respiratory effort, chest expands symmetrically. Lungs are clear to auscultation, no crackles or wheezes. Heart:     Normal rate and regular rhythm. S1 and S2 normal without gallop, murmur, click, rub or other extra sounds. Psych:     Oriented X3, memory intact for recent and remote, good eye contact, and flat affect.      Impression & Recommendations:  Problem # 1:  DIABETES MELLITUS II, UNCOMPLICATED (ICD-250.00) Pt prev well controlled w/ diet and exercise alone; was on metformin and glipizide; given A1C of 8 today, will restart metformin at 500mg  two times a day and titrate up as needed; pt understands and is amenable; RTC for F/u; added ASA 325 to regimen for cv prophylaxis Orders: A1C-[facility] (04540) [facility]- Est  Level 4 (98119)   Problem # 2:  HYPERTENSION, BENIGN SYSTEMIC (ICD-401.1) Pt has tried # Rx for BP and does not tolerate them due to "not feeling good"; long discussion with pt re: lowering BP intially, the body must adjust to the  'new' normal BP; risks of prolonged HTN (CVA, MI) addressed; pt encouraged to cont BP Rx Orders: [facility]- Est  Level 4 (14782)    Laboratory Results   Blood Tests   Date/Time Recieved: July 02, 2006 8:39 AM  Date/Time Reported: July 02, 2006 8:44 AM   HGBA1C: 8.0%   (Normal Range: Non-Diabetic - 3-6%   Control Diabetic - 6-8%)  Comments: ...................................................................[facility] [employer]  July 02, 2006 8:44 AM

## 2010-04-04 NOTE — Assessment & Plan Note (Signed)
Summary: Rx clinic: DM and HTN follow up   Vital Signs:  Patient profile:   55 year old male Weight:      276.5 pounds Pulse rate:   82 / minute BP sitting:   170 / 112  History of Present Illness: 55 yo male presents for DM follow up.  States his blood sugars are running in the 300s.  His 7 day average is 296 and his 14 day average is 326.  His blood sugar in clinic today was 247.  His A1c today was 12. 7%.  He states he eats a loaf of bread in 2 days and stills eats 10 cookies at night.  He is not exercising as much due to the weather.  He also stated he has been out of his blood pressure medications for about 10 days.  Current Medications (verified): 1)  Prodigy Autocode Blood Glucose  Devi (Blood Glucose Monitoring Suppl) .... One Meter 2)  Glipizide Xl 10 Mg Tb24 (Glipizide) .... 2 Tablet By Mouth Daily 3)  Aspirin 81 Mg  Tbec (Aspirin) .... One By Mouth Every Day 4)  Lantus 100 Unit/ml Soln (Insulin Glargine) .... 50 Units Once Daily in The Morning. Dispense 1 Month Supply 5)  Cvs Insulin Syringe 29g X 1/2" 1 Ml Misc (Insulin Syringe-Needle U-100) .... Quantity For Once Daily Dosing - 1 Month Supply 6)  Prodigy Twist Top Lancets 28g  Misc (Lancets) .... Use As Directed 7)  Prodigy Blood Glucose Test  Strp (Glucose Blood) .... Use As Directed  Allergies: No Known Drug Allergies   Impression & Recommendations:  Problem # 1:  DIABETES MELLITUS II, UNCOMPLICATED (ICD-250.00) Assessment Deteriorated DM remains uncontrolled with an A1cof 12.7%.  He continue to eat a lot of starches and is not exercising as much.  Will increase his Lantus to 55 units a day.  Per his request will divide the dose to be 35 units in the morning and 20 units at night.  Continue all his other medications.  He will follow up with Dr. Clelia Croft in 2 weeks and with pharmacy clinic in 4 weeks.  His updated medication list for this problem includes:    Glipizide Xl 10 Mg Tb24 (Glipizide) .Marland Kitchen... 2 tablet by mouth  daily    Aspirin 81 Mg Tbec (Aspirin) ..... One by mouth every day    Lantus 100 Unit/ml Soln (Insulin glargine) .Marland KitchenMarland KitchenMarland KitchenMarland Kitchen 35 units in the morning and 20 units at night.  dispense one month supply    Lisinopril-hydrochlorothiazide 20-12.5 Mg Tabs (Lisinopril-hydrochlorothiazide) .Marland Kitchen... Take 2 tablets once a day  Orders: Glucose Cap-[facility] (45409) A1C-[facility] (81191) Reassessment Each 15 min unit- [facility] (47829)  Problem # 2:  HYPERTENSION, BENIGN SYSTEMIC (ICD-401.1) Assessment: Deteriorated  Blood pressure is elevated today because the patient has been out of his lisinopril-hct for 10 days.  He stated the pharmacy said it was stopped.  Called the pharmacy and restarted it today.  He will follow up with Dr. Clelia Croft in 2 weeks for further evaluation of BP.  He stated the muscle spasms stopped since he stopped this medications.  He will report if the symptoms restart as well.  His updated medication list for this problem includes:    Lisinopril-hydrochlorothiazide 20-12.5 Mg Tabs (Lisinopril-hydrochlorothiazide) .Marland Kitchen... Take 2 tablets once a day  Orders: Reassessment Each 15 min unit- [facility] (56213)  Complete Medication List: 1)  Prodigy Autocode Blood Glucose Devi (Blood glucose monitoring suppl) .... One meter 2)  Glipizide Xl 10 Mg Tb24 (Glipizide) .... 2 tablet  by mouth daily 3)  Aspirin 81 Mg Tbec (Aspirin) .... One by mouth every day 4)  Lantus 100 Unit/ml Soln (Insulin glargine) .... 35 units in the morning and 20 units at night.  dispense one month supply 5)  Cvs Insulin Syringe 29g X 1/2" 1 Ml Misc (Insulin syringe-needle u-100) .... Quantity for once daily dosing - 1 month supply 6)  Prodigy Twist Top Lancets 28g Misc (Lancets) .... Use as directed 7)  Prodigy Blood Glucose Test Strp (Glucose blood) .... Use as directed 8)  Lisinopril-hydrochlorothiazide 20-12.5 Mg Tabs (Lisinopril-hydrochlorothiazide) .... Take 2 tablets once a day  Patient Instructions: 1)  Please schedule a follow-up appointment  in 2 weeks to see Dr. Clelia Croft.  2)  Please schedule a follow-up appointment in 1 month in pharmacy clinic.  3)  Check your blood sugars regularly. If your readings are usually above 300  or below 70 you should contact our office.  4)  It is important that your diabetic A1c level is checked every 3 months.  Your A1c today was above 12%, which is a blood sugar average above 300. 5)  Change your Lantus to 35 units in the morning and 20 units at night. 6)  Eat more lettuce and tomatoes and decrease the amount of bread and cookies you eat. 7)  Restart your lisinopril-hct 20/12.5 mg 2 tablets once a day.  Pick up from your pharmacy today. 8)  The medication list was reviewed and reconciled.  All changed / newly prescribed medications were explained.  A complete medication list was provided to the patient / caregiver. Prescriptions: LISINOPRIL-HYDROCHLOROTHIAZIDE 20-12.5 MG TABS (LISINOPRIL-HYDROCHLOROTHIAZIDE) Take 2 tablets once a day  #60 x 6   Entered by:   Steve Rattler  Pharm D   Authorized by:   Lupita Raider MD   Signed by:   Steve Rattler  Pharm D on 05/13/2008   Method used:   Historical   RxID:   [id number]   Laboratory Results   Blood Tests   Date/Time Received: May 13, 2008 10:39 AM  Date/Time Reported: May 13, 2008 10:46 AM   HGBA1C: 12.7%   (Normal Range: Non-Diabetic - 3-6%   Control Diabetic - 6-8%)  Comments: ...............test performed by......Marland KitchenBonnie A. [place], [place] (ASCP)

## 2010-04-04 NOTE — Assessment & Plan Note (Signed)
Summary: Rx clinic: DM follow up   Vital Signs:  Patient Profile:   55 Years Old Male Height:     73.5 inches Weight:      266.7 pounds BMI:     34.84 Pulse rate:   73 / minute BP sitting:   147 / 93                 Chief Complaint:  Diabetes Management.  History of Present Illness: 55 yo AAM presents for DM education and Lantus adjustment.  Patient states he is not eating correctly.  He is eating more sugary foods consisting of breads, pasta, rice, fruit and corn. He is not eating a lot of other vegatables or meat.  He is exercising 3 times a week, running.  His 14 day average is 234 and his 30 day average is 208 mg/dl.  He is on Lantus 30 units once a day, metformin 1000 mg two times a day, and glipizide XL 20 mg once a day.  He did not take his blood pressure medications today.   Diabetes Management History:      The patient is a 55 years old male who comes in for evaluation of DM Type 2.  He is (or has been) enrolled in the "Diabetic Education Program".  He states understanding of dietary principles but he is not following the appropriate diet.  No sensory loss is reported.  Self foot exams are being performed.  He is checking home blood sugars.  He says that he is exercising.  Type of exercise includes: running.  Duration of exercise is estimated to be 60 min.  He is doing this 3 times per week.        His home fasting blood sugars are as follows: highest: 433.  His 4:00 PM blood sugars reveal: lowest: 79.       Prior Medication List:  ONETOUCH ULTRA TEST  STRP (GLUCOSE BLOOD) test once daily before breakfast.  May substitute brands if necessary. METFORMIN HCL 1000 MG TABS (METFORMIN HCL) Take 1 tablet by mouth two times a day AMLODIPINE BESYLATE 10 MG  TABS (AMLODIPINE BESYLATE) one tablet daily ENALAPRIL MALEATE 20 MG  TABS (ENALAPRIL MALEATE) 1 tablet daily GLIPIZIDE XL 10 MG TB24 (GLIPIZIDE) 2 tablet by mouth daily ASPIRIN 81 MG  TBEC (ASPIRIN) one by mouth every  day LANTUS 100 UNIT/ML SOLN (INSULIN GLARGINE) 30 units once daily in the morning CVS INSULIN SYRINGE 29G X 1/2" 1 ML MISC (INSULIN SYRINGE-NEEDLE U-100) quantity for once daily dosing - 1 month supply   Current Allergies: No known allergies         Impression & Recommendations:  Problem # 1:  DIABETES MELLITUS II, UNCOMPLICATED (ICD-250.00) Assessment: Unchanged Patient with Type 2 DM, uncontrolled with an A1c of >14%.  Currently on Lantus 30 units daily, metformin 1000 mg two times a day, and glipizide XL 20 mg once daily.  He is not eating correctly which is contributing to his increased blood glucose.  Will increase Lantus to 35 units daily, and continue his metformin and glipizide.  Follow up in 2 weeks to re-evaluate the Lantus adjustment.  His updated medication list for this problem includes:    Metformin Hcl 1000 Mg Tabs (Metformin hcl) .Marland Kitchen... Take 1 tablet by mouth two times a day    Enalapril Maleate 20 Mg Tabs (Enalapril maleate) .Marland Kitchen... 1 tablet daily    Glipizide Xl 10 Mg Tb24 (Glipizide) .Marland Kitchen... 2 tablet by mouth daily  Aspirin 81 Mg Tbec (Aspirin) ..... One by mouth every day    Lantus 100 Unit/ml Soln (Insulin glargine) .Marland KitchenMarland KitchenMarland KitchenMarland Kitchen 35 units once daily in the morning  Orders: Reassessment Each 15 min unit- [facility] (54098)   Problem # 2:  HYPERTENSION, BENIGN SYSTEMIC (ICD-401.1) Assessment: Unchanged Blood pressure elevated today above his goal of less than 130/80.  He reports not taking his blood pressure medications today because he got up so early.  Instructed patient to take all medications as prescribed even when coming to the doctors.  Will follow up at next visit.   His updated medication list for this problem includes:    Amlodipine Besylate 10 Mg Tabs (Amlodipine besylate) ..... One tablet daily    Enalapril Maleate 20 Mg Tabs (Enalapril maleate) .Marland Kitchen... 1 tablet daily  Orders: Reassessment Each 15 min unit- [facility] (11914)   Complete Medication List: 1)  Onetouch  Ultra Test Strp (Glucose blood) .... Test once daily before breakfast.  may substitute brands if necessary. 2)  Metformin Hcl 1000 Mg Tabs (Metformin hcl) .... Take 1 tablet by mouth two times a day 3)  Amlodipine Besylate 10 Mg Tabs (Amlodipine besylate) .... One tablet daily 4)  Enalapril Maleate 20 Mg Tabs (Enalapril maleate) .Marland Kitchen.. 1 tablet daily 5)  Glipizide Xl 10 Mg Tb24 (Glipizide) .... 2 tablet by mouth daily 6)  Aspirin 81 Mg Tbec (Aspirin) .... One by mouth every day 7)  Lantus 100 Unit/ml Soln (Insulin glargine) .... 35 units once daily in the morning 8)  Cvs Insulin Syringe 29g X 1/2" 1 Ml Misc (Insulin syringe-needle u-100) .... Quantity for once daily dosing - 1 month supply  Diabetes Management Assessment/Plan:      The following lipid goals have been established for the patient: Total cholesterol goal of 200; LDL cholesterol goal of 100; HDL cholesterol goal of 40; Triglyceride goal of 200.  His blood pressure goal is < 130/80.     Patient Instructions: 1)  Please schedule a follow-up appointment in 2 weeks with pharmacy clinic. 2)  Check your blood sugars regularly. If your readings are usually above 300: or below 70 you should contact our office. 3)  It is important that your Diabetic A1c level is checked every 3 months.  Your last A1c was > 14%.  You will need to have it rechecked in November. 4)  Check your Blood Pressure regularly. If it is above 130/80 you should make an appointment. 5)  Please take all your medications prior to your visit.   6)  Increase your Lantus 35 units daily. 7)  Continue all your other medicaions 8)  You will need to buy the 1/2 cc (ml) insulin syringes so you can increase your Lantus.   ]

## 2010-04-04 NOTE — Consult Note (Signed)
Summary: [facility]   Imported By: Haydee Salter 07/01/2007 14:54:15  _____________________________________________________________________  External Attachment:    Type:   Image     Comment:   External Document  Appended Document: [facility]    Clinical Lists Changes  Observations: Added new observation of LDLNXTDUE: 06/18/2007 (07/02/2007 3:43) Added new observation of DMEYEEXAMNXT: 06/19/2008 (07/02/2007 3:43) Added new observation of TDBOOSTDUE: 10/04/2014 (07/02/2007 3:43) Added new observation of DB FT EX DUE: 04/06/2008 (07/02/2007 3:43) Added new observation of HGBA1CNXTDUE: 07/05/2007 (07/02/2007 3:43) Added new observation of MICRALB DUE: 01/07/2008 (07/02/2007 3:43) Added new observation of CREATNXTDUE: 05/06/2008 (07/02/2007 3:43) Added new observation of POTASSIUMDUE: 05/06/2008 (07/02/2007 3:43) Added new observation of DBT EY CK DT: 06/20/2007 (06/20/2007 3:44) Added new observation of DIAB EYE EX: rare microaneurysm bilaterally (06/20/2007 3:44) Added new observation of LDL: 100 mg/dL (66/44/0347 4:25)       LDL Result Date:  04/07/2007 LDL Result:  100 LDL Next Due:  12 wk Diabetic Eye Exam Date:  06/20/2007 Diabetes Eye Exam Result:  rare microaneurysm bilaterally Diabetes Eye Exam Due:  1 yr

## 2010-04-04 NOTE — Progress Notes (Signed)
Summary: refill  Phone Note Refill Request Message from:  Patient  Refills Requested: Medication #1:  PRODIGY BLOOD GLUCOSE TEST  STRP use as directed Initial call taken by: De Nurse,  February 24, 2009 11:28 AM  Follow-up for Phone Call       Follow-up by: Golden Circle RN,  February 24, 2009 11:31 AM    Prescriptions: PRODIGY BLOOD GLUCOSE TEST  STRP (GLUCOSE BLOOD) use as directed  #100 x 11   Entered by:   Golden Circle RN   Authorized by:   Angelena Sole MD   Signed by:   Golden Circle RN on 02/24/2009   Method used:   [facility]        [facility]  [address] [phone]* (retail)       [address]       [place], [place]  [address]       Ph: [phone] or [phone]       Fax: [phone]   RxID:   [id number]

## 2010-04-04 NOTE — Progress Notes (Signed)
  Phone Note Outgoing Call   Call placed by: Mina Marble PharmD Action Taken: Phone Call Completed Summary of Call: Blood sugars are improved but still remain greater than goal of near 100 fasting.   Patient is happy about progress and notes no problems with injections.  He denies problems with hypoglycemica symptoms.  Home CBGs since list contact have decreased to 173-234.  Patient comfortable with continued titration.  Increase Lantus to 30 untis each morning.  Will plan to reassess CBGs at office visit follow up in 1 week.  Patient verbalized understanding of treatment plan.

## 2010-04-04 NOTE — Assessment & Plan Note (Signed)
Summary: HTN, DM, Obesity    Vital Signs:  Patient Profile:   55 Years Old Male Weight:      254.4 pounds Temp:     97.7 degrees F oral Pulse rate:   86 / minute BP sitting:   119 / 78  (left arm) Cuff size:   large  Pt. in pain?   no  Vitals Entered By: Garen Grams LPN (September 30, 2007 9:01 AM)                  Chief Complaint:  f/u visit.  History of Present Illness: 55yr old AAM presents to discuss the following:  1) DM - A1C in May was was 8%.  Was previously 10%. Pt said made that change by cutting out ice cream but has gone back to it and reports CBGs running 200-400s. Recently increased glipizide to 20mg   without side effect. Continues on Januvia with his metformin. No side effects. Tolerating all well.  Eye exam UTD. Checks feet regularly.  2) HTN - On last vsit, increased enalapril dose to 20mg  but reports he has significan diarrhea when he does ths.  Still on norvasc. No CP, SOB, orthopnea, peripheral edeam.  3) Prevention - Had colonoscopy by Dr Elnoria Howard on 6/25. I do not yet have he records.  4) Obesty - Pt has lost 16lbs since I saw him last by cutting out bread and exercising more.     Current Allergies (reviewed today): No known allergies   Past Medical History:    Reviewed history from 02/05/2007 and no changes required:       - T2DM       - HTN       - ?MR       - h/o elevated LFTs in Feb 2008 with neg hep panel and neg abd [place] except for fatty liver - FU LFTS 2008 normal   Social History:    Reviewed history from 03/07/2007 and no changes required:       Pt lives alone, Former 12 pack year smoker, former heavy drinker, no illicts; On disability--but pt won't elaborate. exercises 1hour daily with weight lighting and jogging.      Physical Exam  General:     obese AAM,in no acute distress; alert,appropriate and cooperative throughout examination. has odd movements and facial expressions.  Lungs:     Normal respiratory effort, chest expands  symmetrically. Lungs are clear to auscultation, no crackles or wheezes. Heart:     Normal rate and regular rhythm. S1 and S2 normal without gallop, murmur, click, rub or other extra sounds. Pulses:     2+ dp and pt pulses Extremities:     no edema.  Skin:     turgor normal and color normal.   Psych:     normally interactive and good eye contact.  Has some odd facial movements and almost mouths words to himself during our interview.     Impression & Recommendations:  Problem # 1:  OBESITY, UNSPECIFIED (ICD-278.00) Assessment: Improved Continues to lose weight by cutting out carbs. This will only continue to help with BP and CBGs. Orders: [facility]- Est  Level 4 (42595)   Problem # 2:  HYPERTENSION, BENIGN SYSTEMIC (ICD-401.1) Assessment: Improved I doubt ACE inhibitor is causing diarrhea but given his well controlled BP today and his recent 16lb weight loss, it's worth a shot to cut back to 1/2 tablet to see if he tolerates.   His updated medication list for this problem  includes:    Amlodipine Besylate 10 Mg Tabs (Amlodipine besylate) ..... One tablet daily    Enalapril Maleate 20 Mg Tabs (Enalapril maleate) .Marland Kitchen... 1/2 tablet daily  Orders: Basic Met-[facility] (27253-66440) [facility]- Est  Level 4 (34742)   Problem # 3:  DIABETES MELLITUS II, UNCOMPLICATED (ICD-250.00) Assessment: Unchanged Per report, CBGs are worsening but pt has lost weight by cutting back on carbs. I would like to hold the course for now and check A1C next month prior to any med changes given that he is so difficult to convince of med changes. Also advised to cut back on sweets. Check BMET today for dehydration and glucose.   His updated medication list for this problem includes:    Metformin Hcl 1000 Mg Tabs (Metformin hcl) .Marland Kitchen... Take 1 tablet by mouth two times a day    Enalapril Maleate 20 Mg Tabs (Enalapril maleate) .Marland Kitchen... 1/2 tablet daily    Glipizide Xl 10 Mg Tb24 (Glipizide) .Marland Kitchen... 2 tablet by mouth daily    Aspirin  81 Mg Tbec (Aspirin) ..... One by mouth every day    Januvia 100 Mg Tabs (Sitagliptin phosphate) .Marland Kitchen... Take 1 tab by mouth daily  Orders: [facility]- Est  Level 4 (59563)   Complete Medication List: 1)  Onetouch Ultra Test Strp (Glucose blood) .... Test once daily before breakfast.  may substitute brands if necessary. 2)  Metformin Hcl 1000 Mg Tabs (Metformin hcl) .... Take 1 tablet by mouth two times a day 3)  Amlodipine Besylate 10 Mg Tabs (Amlodipine besylate) .... One tablet daily 4)  Enalapril Maleate 20 Mg Tabs (Enalapril maleate) .... 1/2 tablet daily 5)  Glipizide Xl 10 Mg Tb24 (Glipizide) .... 2 tablet by mouth daily 6)  Aspirin 81 Mg Tbec (Aspirin) .... One by mouth every day 7)  Triamcinolone Acetonide 0.1 % Crea (Triamcinolone acetonide) .... Apply bid to affected area  dispense 30 gram tube 8)  Januvia 100 Mg Tabs (Sitagliptin phosphate) .... Take 1 tab by mouth daily   Patient Instructions: 1)  Please follow up with Dr Clelia Croft in one month to check on your sugars. 2)  I'm so impressed with you weight loss. Keep up the great work!! 3)  Since you think the diarrhea is related to the higher dose of Enalapril, it's okay to cut that tablet in half for now.  4)  Since you have noticed that your sugars are going up, try to pay attention to the sweets that you're eating. We'll check your value next month and make any changes to your medicines at that time.  5)  I'll get the records from Dr Elnoria Howard. 6)  I'll send you a letter with your bloodwork results when they return.    Prescriptions: ONETOUCH ULTRA TEST  STRP (GLUCOSE BLOOD) test once daily before breakfast.  May substitute brands if necessary.  #100 x 2   Entered and Authorized by:   Lupita Raider MD   Signed by:   Lupita Raider MD on 09/30/2007   Method used:   [facility]ron[facility] to ...       [facility]  [address] [facility] [address] [phone]*       [address]       [place], [place]  [address]       Ph: [phone] or  [phone]       Fax: [phone]   RxID:   [id number] ENALAPRIL MALEATE 20 MG  TABS (ENALAPRIL MALEATE) 1/2 tablet daily  #30 x  3   Entered and Authorized by:   Lupita Raider MD   Signed by:   Lupita Raider MD on 09/30/2007   Method used:   Electronically sent to ...       [facility]  [address] [facility] [address] [phone]*       [address]       [place], [place]  [address]       Ph: [phone] or [phone]       Fax: [phone]   RxID:   [id number]  ]  Vital Signs:  Patient Profile:   54 Years Old Male Weight:      254.4 pounds Temp:     97.7 degrees F oral Pulse rate:   86 / minute BP sitting:   119 / 78 Cuff size:   large                  Appended Document: HTN, DM, Obesity     Clinical Lists Changes  Observations: Added new observation of COLONNXTDUE: 09/2017 (10/01/2007 12:14) Added new observation of COLONRECACT: Repeat colonoscopy in 10 years.   (08/28/2007 12:16) Added new observation of COLONOSCOPY: Diverticula. Medium hemorrhoids. Done at [facility] by Dr Elnoria Howard. (08/28/2007 12:16)        Colonoscopy  Procedure date:  08/28/2007  Findings:      Diverticula. Medium hemorrhoids. Done at [facility] by Dr Elnoria Howard.  Comments:      Repeat colonoscopy in 10 years.    Procedures Next Due Date:    Colonoscopy: 09/2017   Colonoscopy  Procedure date:  08/28/2007  Findings:      Diverticula. Medium hemorrhoids. Done at [facility] by Dr Elnoria Howard.  Comments:      Repeat colonoscopy in 10 years.    Procedures Next Due Date:    Colonoscopy: 09/2017

## 2010-04-04 NOTE — Miscellaneous (Signed)
Summary: refill   Clinical Lists Changes called to his drugstore   Appended Document: refill    Prescriptions: ONETOUCH ULTRA TEST  STRP (GLUCOSE BLOOD) use as directed  #100 x 6   Entered and Authorized by:   Lupita Raider MD   Signed by:   Lupita Raider MD on 01/17/2007   Method used:   Electronically sent to ...       [facility] [address] [phone]*       [address]       [place], [place]  [address]       Ph: [phone] or [phone]       Fax: [phone]   RxID:   [id number]

## 2010-04-04 NOTE — Assessment & Plan Note (Signed)
Summary: FU/KH   Vital Signs:  Patient Profile:   55 Years Old Male Weight:      276.7 pounds Temp:     98.1 degrees F oral Pulse rate:   87 / minute BP sitting:   136 / 90  (right arm) Cuff size:   regular  Pt. in pain?   no  Vitals Entered By: Garen Grams LPN (Jul 07, 1608 9:17 AM)                  Chief Complaint:  f/u visit.  History of Present Illness: HTN- Using medications daily and is without side effects.  Denies any cp, sob, ha, vision changes.    DM-Denies any hypoglyemic sx, polyuria/polydypsia, foot numbness/tingling.Home cbg's running 160's fasting. Weight up 6 pounds since last visit due to poor dietary choices.  Exercises every other day by jogging. A1c 10.5 in 2/09.    rash- rash on lower legs x 1 week.  mild pruritis. no fevers/chills.  no new detergents.          Prior Medications Reviewed Using: Medication Bottles  Current Allergies (reviewed today): No known allergies    Social History:    Reviewed history from 03/07/2007 and no changes required:       Pt lives alone, Former 12 pack year smoker, former heavy drinker, no illicts; On disability--but pt won't elaborate. exercises 1hour daily with weight lighting and jogging.     Review of Systems  GI      Denies bloody stools, change in bowel habits, constipation, and dark tarry stools.  GU      Denies nocturia, urinary frequency, and urinary hesitancy.   Physical Exam  General:     Well-developed,well-nourished,in no acute distress; alert,appropriate and cooperative throughout examination Mouth:     pharynx pink and moist.   Neck:     no carotid bruits Lungs:     Normal respiratory effort, chest expands symmetrically. Lungs are clear to auscultation, no crackles or wheezes. Heart:     Normal rate and regular rhythm. S1 and S2 normal without gallop, murmur, click, rub or other extra sounds. Abdomen:     Bowel sounds positive,abdomen soft and non-tender without masses,  organomegaly or hernias noted. obese Rectal:     No external abnormalities noted. Normal sphincter tone. No rectal masses or tenderness. stool hemoccult negative. Prostate:     Prostate gland firm and smooth, no enlargement, nodularity, tenderness, mass, asymmetry or induration. Pulses:     2+ dp and pt pulses Skin:     small linear areas of erythema and red papules on medial portion of lower leg bilaterally, 5cm x 0.5 cm in size  Diabetes Management Exam:    Foot Exam (with socks and/or shoes not present):       Sensory-Pinprick/Light touch:          Left medial foot (L-4): normal          Left dorsal foot (L-5): normal          Left lateral foot (S-1): normal          Right medial foot (L-4): normal          Right dorsal foot (L-5): normal          Right lateral foot (S-1): normal       Sensory-Monofilament:          Left foot: normal          Right foot: normal  Inspection:          Left foot: normal          Right foot: normal       Nails:          Left foot: normal          Right foot: normal    Impression & Recommendations:  Problem # 1:  HYPERTENSION, BENIGN SYSTEMIC (ICD-401.1) Assessment: Deteriorated Above goal. Will increase enalapril to 20 mg daily.  Check lytes/renal function today.  f/u in 1 month. His updated medication list for this problem includes:    Amlodipine Besylate 10 Mg Tabs (Amlodipine besylate) ..... One tablet daily    Enalapril Maleate 20 Mg Tabs (Enalapril maleate) .Marland Kitchen... Take 1 tab by mouth daily  Orders: Lipid-[facility] (04540-98119) Basic Met-[facility] (14782-95621) [facility]- Est  Level 4 (30865)   Problem # 2:  DIABETES MELLITUS II, UNCOMPLICATED (ICD-250.00) Assessment: Unchanged Eye exam up to date. due for a1c today.  last a1c 10.5. If still elevated will likely need to start lantus. Will check fasting lipid panel to further risk stratify.  Start aspirin 81 mg daily. His updated medication list for this problem includes:    Metformin Hcl 1000  Mg Tabs (Metformin hcl) .Marland Kitchen... Take 1 tablet by mouth two times a day    Enalapril Maleate 20 Mg Tabs (Enalapril maleate) .Marland Kitchen... Take 1 tab by mouth daily    Glipizide Xl 10 Mg Tb24 (Glipizide) .Marland Kitchen... 1 tablet by mouth daily    Aspirin 81 Mg Tbec (Aspirin) ..... One by mouth every day  Orders: Lipid-[facility] ([id number]) A1C-[facility] (84132) Basic Met-[facility] (44010-27253) [facility]- Est  Level 4 (66440)   Problem # 3:  SKIN RASH (ICD-782.1) appears to be contact dermatitis. Will start triamcinolone cream. His updated medication list for this problem includes:    Triamcinolone Acetonide 0.1 % Crea (Triamcinolone acetonide) .Marland Kitchen... Apply bid to affected area  dispense 30 gram tube  Orders: [facility]- Est  Level 4 (34742)   Problem # 4:  Preventive Health Care (ICD-V70.0) will check psa. Stool heme negative. Will discuss colon cancer screening with primary md at next visit. no red flag signs/sx, no fam hx.  Complete Medication List: 1)  Onetouch Ultra Test Strp (Glucose blood) .... Test once daily before breakfast.  may substitute brands if necessary. 2)  Metformin Hcl 1000 Mg Tabs (Metformin hcl) .... Take 1 tablet by mouth two times a day 3)  Amlodipine Besylate 10 Mg Tabs (Amlodipine besylate) .... One tablet daily 4)  Enalapril Maleate 20 Mg Tabs (Enalapril maleate) .... Take 1 tab by mouth daily 5)  Glipizide Xl 10 Mg Tb24 (Glipizide) .Marland Kitchen.. 1 tablet by mouth daily 6)  Aspirin 81 Mg Tbec (Aspirin) .... One by mouth every day 7)  Triamcinolone Acetonide 0.1 % Crea (Triamcinolone acetonide) .... Apply bid to affected area  dispense 30 gram tube  Other Orders: PSA-[facility] ([id number])   Patient Instructions: 1)  take enalapril 20 mg daily (2 pills of 10 mg each, then get new prescription and take 20 mg pill once daily) 2)  start baby aspirin (81 mg once daily) 3)  apply triamcinolone cream to affected area twice daily 4)  watch your diet 5)  increase your exercise 6)  follow up in 1 month with dr.  Clelia Croft   Prescriptions: TRIAMCINOLONE ACETONIDE 0.1 % CREA (TRIAMCINOLONE ACETONIDE) apply bid to affected area  dispense 30 gram tube  #1 x 1   Entered and Authorized by:   Benn Moulder MD  Signed by:   Benn Moulder MD on 07/08/2007   Method used:   Electronically sent to ...       [facility] [address] [phone]*       [address]       [place], [place]  [address]       Ph: [phone] or [phone]       Fax: [phone]   RxID:   [id number] ASPIRIN 81 MG  TBEC (ASPIRIN) one by mouth every day  #30 x 3   Entered and Authorized by:   Benn Moulder MD   Signed by:   Benn Moulder MD on 07/08/2007   Method used:   Electronically sent to ...       [facility] [address] [phone]*       [address]       [place], [place]  [address]       Ph: [phone] or [phone]       Fax: [phone]   RxID:   [id number] ENALAPRIL MALEATE 20 MG  TABS (ENALAPRIL MALEATE) Take 1 tab by mouth daily  #30 x 3   Entered and Authorized by:   Benn Moulder MD   Signed by:   Benn Moulder MD on 07/08/2007   Method used:   Electronically sent to ...       [facility] [address] [phone]*       [address]       [place], [place]  [address]       Ph: [phone] or [phone]       Fax: [phone]   RxID:   [id number]  ] Hemoccult Result Date:  07/08/2007 Hemoccult Result:  normal Hemoccult Next Due:  1 yr    Appended Document: A1C RESULTS A1c improved, but still above goal. Patent prefers not to start insulin. Will increase glucotrol to 20 mg daily, add [place].  Patient notified via phone. Laboratory Results   Blood Tests   Date/Time Received: Jul 08, 2007 9:58 AM  Date/Time Reported: Jul 08, 2007 10:28 AM   HGBA1C: 8.6%   (Normal Range: Non-Diabetic - 3-6%   Control Diabetic - 6-8%)  Comments: DONNA LORING  Jul 08, 2007 10:28 AM

## 2010-04-04 NOTE — Assessment & Plan Note (Signed)
Summary: F/U VISIT/[facility]   Vital Signs:  Patient profile:   55 year old male Height:      71 inches Weight:      267.7 pounds BMI:     37.47 Temp:     98.0 degrees F oral Pulse rate:   99 / minute BP sitting:   158 / 85  (left arm) Cuff size:   large  Vitals Entered By: Gladstone Pih (April 11, 2009 2:08 PM) CC: F/U DM abd HTN Is Patient Diabetic? Yes Did you bring your meter with you today? Yes Pain Assessment Patient in pain? no        CC:  F/U DM abd HTN.  History of Present Illness: 1. DMII: Pt is taking his Insulin as prescribed.  He continues to have high blood sugar readings.  Averaging between 250-450.  He is not following a healthy, carb modified diet.  He did not go to the Ophthalmologist.         ROS: Denies numbness/weakness, vision changes  2. HTN: Pt has been taking and tolerating his medications as prescribed.  He does not check his blood pressure regularly at home.         ROS: Denies any chest pain, headache, shortness of breath  3. Obesity: Pt has not been following a healthy, carb modified diet.  He likes to eat cheese burgers, pizza, and fried foods.  He does not exercise.    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 One Meter 2)  Aspirin 81 Mg  Tbec (Aspirin) .... One By Mouth Every Day 3)  Cvs Insulin Syringe 29g X 1/2" 1 Ml Misc (Insulin Syringe-Needle U-100) .... Quantity For Once Daily Dosing - 1 Month Supply 4)  Prodigy Twist Top Lancets 28g  Misc (Lancets) .... Use As Directed 5)  Prodigy Blood Glucose Test  Strp (Glucose Blood) .... Use As Directed 6)  Lantus 100  Unit/ml Soln (Insulin Glargine) .... Inject 40 Units Twice Daily. Dispense One Month Supply 7)  Multivitamins  Tabs (Multiple Vitamin) .... One Daily 8)  Losartan Potassium-Hctz 50-12.5 Mg Tabs (Losartan Potassium-Hctz) .... Take 1 Tab By Mouth Daily  Allergies (verified): No Known Drug Allergies  Past History:  Past Medical History: Reviewed history from 10/29/2007 and no changes required. - T2DM - HTN - ?MR vs schizophrenia - nothing has been documented but pt often reacts to something in the room that I don't appreciate and refers to himself as "we" a lot. odd behaviors like making strange noises and washing hands in bleach - h/o elevated LFTs in Feb 2008 with neg hep panel and neg abd [place] except for fatty liver - FU LFTS 2008 normal  Social History: Reviewed history from 12/01/2008 and no changes required. Pt lives alone, Former 12 pack year smoker, former heavy drinker, no illicts; On disability--but pt won't elaborate. Has odd affect and ?MR vs schizoaffective d/o. Doesn't exercise.  Denies tobacco, alcohol, or drug use  Physical Exam  General:  Vitals reviewed. obese AAM,in no acute distress; alert,appropriate and cooperative throughout examination. has odd movements and facial expressions.  Head:  Normocephalic and atraumatic without obvious abnormalities. Eyes:  vision grossly intact, pupils equal, pupils round, pupils reactive to light, and no retinal abnormalitiies.   Mouth:  poor dentition.   Neck:  no carotid bruits Lungs:  normal respiratory effort, no crackles, and no wheezes.   Heart:  normal rate, regular rhythm, no murmur, and no gallop.   Abdomen:  Bowel sounds positive,abdomen soft and non-tender without masses Extremities:  no edema.    Impression & Recommendations:  Problem # 1:  DIABETES MELLITUS II, UNCOMPLICATED (ICD-250.00) Assessment Deteriorated Poor diet.  Increase Lantus to 40 U BID The following medications were removed from the medication list:     Benazepril-hydrochlorothiazide 20-25 Mg Tabs (Benazepril-hydrochlorothiazide) .Marland Kitchen... Take 1 tab by mouth daily His updated medication list for this problem includes:    Aspirin 81 Mg Tbec (Aspirin) ..... One by mouth every day    Lantus 100 Unit/ml Soln (Insulin glargine) ..... Inject 40 units twice daily. dispense one month supply    Losartan Potassium-hctz 50-12.5 Mg Tabs (Losartan potassium-hctz) .Marland Kitchen... Take 1 tab by mouth daily  Orders: A1C-[facility] ([address]) [facility]- Est  Level 4 (60454)  Problem # 2:  HYPERTENSION, BENIGN SYSTEMIC (ICD-401.1) Assessment: Unchanged  He thinks that Benazepril gives him muscle cramps.  He complains of this with many of his medictions.  Will switch to Losartan. The following medications were removed from the medication list:    Benazepril-hydrochlorothiazide 20-25 Mg Tabs (Benazepril-hydrochlorothiazide) .Marland Kitchen... Take 1 tab by mouth daily His updated medication list for this problem includes:    Losartan Potassium-hctz 50-12.5 Mg Tabs (Losartan potassium-hctz) .Marland Kitchen... Take 1 tab by mouth daily  Orders: [facility]- Est  Level 4 (09811)  Problem # 3:  OBESITY, UNSPECIFIED (ICD-278.00) Assessment: Unchanged  Encouraged him on the importance of a healthy diet.  Orders: [facility]- Est  Level 4 (91478)  Complete Medication List: 1)  Prodigy Autocode Blood Glucose Devi (Blood glucose monitoring suppl) .... One meter 2)  Aspirin 81 Mg Tbec (Aspirin) .... One by mouth every day 3)  Cvs Insulin Syringe 29g X 1/2" 1 Ml Misc (Insulin syringe-needle u-100) .... Quantity for once daily dosing - 1 month supply 4)  Prodigy Twist Top Lancets 28g Misc (Lancets) .... Use as directed 5)  Prodigy Blood Glucose Test Strp (Glucose blood) .... Use as directed 6)  Lantus 100 Unit/ml Soln (Insulin glargine) .... Inject 40 units twice daily. dispense one month supply 7)  Multivitamins Tabs (Multiple vitamin) .... One daily 8)  Losartan Potassium-hctz 50-12.5 Mg Tabs (Losartan potassium-hctz) ....  Take 1 tab by mouth daily  Patient Instructions: 1)  We will continue to work to try and get your diabetes and blood pressure under control 2)  I am going to change your blood pressure medicine to Losartan-HCTZ please take it everyday 3)  We are going to increase your Lantus to 40 U twice a day 4)  You know what you need to do to and eat a healthier diet.  Cut out the sugary, fried, and processed foods 5)  Please schedule a follow up appt in 6 weeks Prescriptions: LOSARTAN POTASSIUM-HCTZ 50-12.5 MG TABS (LOSARTAN POTASSIUM-HCTZ) Take 1 tab by mouth daily  #30 x 3   Entered and Authorized by:   Angelena Sole MD   Signed by:   Angelena Sole MD on 04/11/2009   Method used:   Electronically to        [facility] [address] [phone]* (retail)       [address]       [place]  [place], [place]  [address]       Ph: [phone] or [phone]       Fax: [phone]   RxID:   [id number]   Prevention & Chronic Care Immunizations   Influenza vaccine: none available  (05/19/2008)   Influenza vaccine due: 05/19/2009    Tetanus booster: 10/03/2004: Done.   Tetanus booster due: 10/04/2014    Pneumococcal vaccine: Not documented  Colorectal Screening   Hemoccult: normal  (07/08/2007)   Hemoccult due: 07/07/2008    Colonoscopy: Diverticula. Medium hemorrhoids. Done at [facility] by Dr Elnoria Howard.  (08/28/2007)   Colonoscopy action/deferral: Repeat colonoscopy in 10 years.    (08/28/2007)   Colonoscopy due: 09/2017  Other Screening   PSA: 0.37  (07/08/2007)   PSA due due: 07/07/2008   Smoking status: never  (04/11/2009)  Diabetes Mellitus   HgbA1C: >14.0  (04/11/2009)   Hemoglobin A1C due: 08/13/2008    Eye exam: rare microaneurysm bilaterally  (06/20/2007)   Diabetic eye exam action/deferral: Ophthalmology referral  (12/01/2008)   Eye exam due: 06/19/2008    Foot exam: yes  (08/11/2007)   High risk foot: Not documented   Foot care education:  completed  (05/19/2008)   Foot exam due: 08/10/2008    Urine microalbumin/creatinine ratio: Not documented   Urine microalbumin/cr due: Not Indicated    Diabetes flowsheet reviewed?: Yes   Progress toward A1C goal: Unchanged  Lipids   Total Cholesterol: 148  (12/01/2008)   Lipid panel action/deferral: Lipid Panel ordered   LDL: 71  (12/01/2008)   LDL Direct: 100  (04/07/2007)   HDL: 34  (12/01/2008)   Triglycerides: 215  (12/01/2008)  Hypertension   Last Blood Pressure: 158 / 85  (04/11/2009)   Serum creatinine: 1.26  (04/22/2008)   Serum potassium 4.0  (07/08/2008)    Hypertension flowsheet reviewed?: Yes   Progress toward BP goal: Improved  Self-Management Support :   Personal Goals (by the next clinic visit) :     Personal A1C goal: 8  (10/08/2008)     Personal blood pressure goal: 130/80  (10/08/2008)     Personal LDL goal: 100  (02/28/2009)    Diabetes self-management support: Written self-care plan  (10/26/2008)   Last diabetes self-management training by diabetes educator: completed    Hypertension self-management support: Written self-care plan  (10/26/2008)    Hypertension self-management support not done because: Good outcomes  (10/08/2008)  Laboratory Results   Blood Tests   Date/Time Received: April 11, 2009 2:19 PM  Date/Time Reported: April 11, 2009 2:28 PM   HGBA1C: >14.0%   (Normal Range: Non-Diabetic - 3-6%   Control Diabetic - 6-8%)  Comments: ...........test performed by...........Marland KitchenTerese Door, CMA

## 2010-04-04 NOTE — Assessment & Plan Note (Signed)
Summary: F/U [facility]   Vital Signs:  Patient profile:   55 year old male Weight:      266 pounds BMI:     37.23 Pulse rate:   81 / minute BP sitting:   123 / 79  (right arm)  History of Present Illness: Patient comes in and states he has been feeling bad.  He has decreased his insulin because he feels worse when he takes a higher dose.   His 7 day avg CBGs = 350  He notes nocturia 2 times per night.   Exercise is limited to a few times per week but fatigue has limited him to less than what he would like to do.   Current Medications (verified): 1)  Prodigy Autocode Blood Glucose  Devi (Blood Glucose Monitoring Suppl) .... One Meter 2)  Aspirin 81 Mg  Tbec (Aspirin) .... One By Mouth Every Day 3)  Cvs Insulin Syringe 29g X 1/2" 1 Ml Misc (Insulin Syringe-Needle U-100) .... Quantity For Once Daily Dosing - 1 Month Supply 4)  Prodigy Twist Top Lancets 28g  Misc (Lancets) .... Use As Directed 5)  Prodigy Blood Glucose Test  Strp (Glucose Blood) .... Use As Directed 6)  Lisinopril-Hydrochlorothiazide 20-12.5 Mg Tabs (Lisinopril-Hydrochlorothiazide) .... Take One Daily 7)  Lantus 100 Unit/ml Soln (Insulin Glargine) .... Inject 30 Units Twice Daily. Dispense One Month Supply 8)  Novolog 100 Unit/ml Soln (Insulin Aspart) .Marland Kitchen.. 10 Units Twice Daily Prior To Meals. Dispense One Month Supply.  Allergies (verified): No Known Drug Allergies   Impression & Recommendations:  Problem # 1:  DIABETES MELLITUS II, UNCOMPLICATED (ICD-250.00) POOR control with A1C >14 despite humalog mix.  Most likely due to nonadherence and suboptimal dose.   Patient willing to change back to Lantus AND add short acting insulin.  Restart Lantus 30 units twice daily AND Novolog 10 units prior to BOTH meals he eats each day.  Follow up in 1 week in Rx clinic.  TTFFC:  45 minutes.  Seen today with Dr. Magnus Ivan, MD  The following medications were removed from the medication list:    Humalog Mix 75/25 75-25 % Susp  (Insulin lispro prot & lispro) .Marland KitchenMarland KitchenMarland KitchenMarland Kitchen 50 units in the morning and 60 units in the eveing prior to meals His updated medication list for this problem includes:    Aspirin 81 Mg Tbec (Aspirin) ..... One by mouth every day    Lisinopril-hydrochlorothiazide 20-12.5 Mg Tabs (Lisinopril-hydrochlorothiazide) .Marland Kitchen... Take one daily    Lantus 100 Unit/ml Soln (Insulin glargine) ..... Inject 30 units twice daily. dispense one month supply    Novolog 100 Unit/ml Soln (Insulin aspart) .Marland KitchenMarland KitchenMarland KitchenMarland Kitchen 10 units twice daily prior to meals. dispense one month supply.  Orders: A1C-[facility] ([address]) Reassessment Each 15 min unit- [facility] ([address])  Complete Medication List: 1)  Prodigy Autocode Blood Glucose Devi (Blood glucose monitoring suppl) .... One meter 2)  Aspirin 81 Mg Tbec (Aspirin) .... One by mouth every day 3)  Cvs Insulin Syringe 29g X 1/2" 1 Ml Misc (Insulin syringe-needle u-100) .... Quantity for once daily dosing - 1 month supply 4)  Prodigy Twist Top Lancets 28g Misc (Lancets) .... Use as directed 5)  Prodigy Blood Glucose Test Strp (Glucose blood) .... Use as directed 6)  Lisinopril-hydrochlorothiazide 20-12.5 Mg Tabs (Lisinopril-hydrochlorothiazide) .... Take one daily 7)  Lantus 100 Unit/ml Soln (Insulin glargine) .... Inject 30 units twice daily. dispense one month supply 8)  Novolog 100 Unit/ml Soln (Insulin aspart) .Marland Kitchen.. 10 units twice daily prior to meals.  dispense one month supply.  Patient Instructions: 1)  Stop Humalog Mix. 2)  Start Lantus 30 units twice daily. 3)  Start Novolog 10 units twice daily prior to meals. 4)  Schedule office appointment next Friday in Rx Clinic for brief blood sugar check.  Please bring all medications.  5)  Remember to store all insulin in refrigerator after you pick up from pharmacy.  Prescriptions: NOVOLOG 100 UNIT/ML SOLN (INSULIN ASPART) 10 units twice daily prior to meals. Dispense one month supply.  #1 x 5   Entered by:   Madelon Lips Pharm D   Authorized by:    Lupita Raider MD   Signed by:   Madelon Lips Pharm D on 09/30/2008   Method used:   Electronically to        [facility] [address] [phone]* (retail)       [address]       [place], [place]  [address]       Ph: [phone] or [phone]       Fax: [phone]   RxID:   [id number] LANTUS 100 UNIT/ML SOLN (INSULIN GLARGINE) Inject 30 units twice daily. Dispense one month supply  #1 x 5   Entered by:   Christian Mate D   Authorized by:   Lupita Raider MD   Signed by:   Madelon Lips Pharm D on 09/30/2008   Method used:   Electronically to        [facility] [address] [phone]* (retail)       [address]       [place], [place]  [address]       Ph: [phone] or [phone]       Fax: [phone]   RxID:   [id number]   Appended Document: A1c  >14.0%    Lab Visit  Laboratory Results   Blood Tests   Date/Time Received: September 30, 2008 10:22 AM  Date/Time Reported: September 30, 2008 11:35 AM   HGBA1C: >14.0%%   (Normal Range: Non-Diabetic - 3-6%   Control Diabetic - 6-8%)  Comments: ...............test performed by......Marland KitchenBonnie A. [place], [place] (ASCP)    Orders Today:

## 2010-04-04 NOTE — Assessment & Plan Note (Signed)
Summary: f/u dm,df   Vital Signs:  Patient profile:   55 year old male Height:      71 inches Weight:      249.3 pounds BMI:     34.90 Temp:     98.2 degrees F oral Pulse rate:   89 / minute BP sitting:   146 / 89  (left arm) Cuff size:   large  Vitals Entered By: Garen Grams LPN (January 24, 2010 2:58 PM) CC: f/u dm Is Patient Diabetic? Yes Did you bring your meter with you today? No Pain Assessment Patient in pain? no        CC:  f/u dm.  History of Present Illness: 1. DMII:  Pt is taking Lantus.  He has started taking 50 Units in the morning and 25 Units in the evening.  He does check his blood sugars regularly and they have still been high.  Average is around 250-300.  He has still been trying to walk.  ROS: denies vision changes, hypoglycemic episodes  2. HTN: Pt states that he is taking his medicine as prescribed.  He does check his blood pressure at home regularly and it is usually around 140/90.    ROS: denies chest pain, shortness of breath  Habits & Providers  Alcohol-Tobacco-Diet     Tobacco Status: never     Year Quit: 1994  Current Medications (verified): 1)  Prodigy Autocode Blood Glucose  Devi (Blood Glucose Monitoring Suppl) .... One Meter 2)  Aspirin 81 Mg  Tbec (Aspirin) .... One By Mouth Every Day 3)  Cvs Insulin Syringe 29g X 1/2" 1 Ml Misc (Insulin Syringe-Needle U-100) .... Quantity For Once Daily Dosing - 1 Month Supply 4)  Prodigy Twist Top Lancets 28g  Misc (Lancets) .... Use As Directed 5)  Prodigy Blood Glucose Test  Strp (Glucose Blood) .... Use As Directed 6)  Lantus 100 Unit/ml Soln (Insulin Glargine) .... Inject 50 Units in The Morning and 30 Units At Night 7)  Multivitamins  Tabs (Multiple Vitamin) .... One Daily 8)  Losartan Potassium-Hctz 50-12.5 Mg Tabs (Losartan Potassium-Hctz) .... Take 1 Tab By Mouth Daily 9)  Metoprolol Succinate 25 Mg Xr24h-Tab (Metoprolol Succinate) .... Take 1 Tab By Mouth Daily For Blood  Pressure  Allergies: No Known Drug Allergies  Social History: Reviewed history from 05/24/2009 and no changes required. Pt lives alone, Former 12 pack year smoker, former heavy drinker, no illicts; On disability--but pt won't elaborate. Jogs for exercise.  Denies tobacco, alcohol, or drug use  Physical Exam  General:  Vitals reviewed. obese AAM,in no acute distress; alert,appropriate and cooperative throughout examination. has odd movements and facial expressions.  Mouth:  poor dentition.  pharynx pink and moist.   Neck:  supple.   Lungs:  normal respiratory effort, no crackles, and no wheezes.   Heart:  normal rate, regular rhythm, no murmur, and no gallop.   Extremities:  no edema.  Skin:  turgor normal and color normal.   Psych:  normally interactive and good eye contact.  Has some odd facial movements and expressions   Impression & Recommendations:  Problem # 1:  DIABETES MELLITUS II, UNCOMPLICATED (ICD-250.00) Assessment Deteriorated Unsure if he is actually taking his medicines or not.  If he is he needs more insulin.  Will increase Lantus to 50 Units in the morning and 30 Units in the evening. His updated medication list for this problem includes:    Aspirin 81 Mg Tbec (Aspirin) ..... One by mouth every day  Lantus 100 Unit/ml Soln (Insulin glargine) ..... Inject 50 units in the morning and 30 units at night    Losartan Potassium-hctz 50-12.5 Mg Tabs (Losartan potassium-hctz) .Marland Kitchen... Take 1 tab by mouth daily  Orders: [facility]1C-[facility] (16073) [facility]- Est  Level 4 (71062)  Problem # 2:  HYPERTENSION, BENIGN SYSTEMIC (ICD-401.1) Assessment: Deteriorated  Unsure if he is taking his medicines or not.  If he is, he's not at goal with Losartan - HCTZ.  Will add Metoprolol. His updated medication list for this problem includes:    Losartan Potassium-hctz 50-12.5 Mg Tabs (Losartan potassium-hctz) .Marland Kitchen... Take 1 tab by mouth daily    Metoprolol Succinate 25 Mg Xr24h-tab (Metoprolol  succinate) .Marland Kitchen... Take 1 tab by mouth daily for blood pressure  Orders: [facility]- Est  Level 4 (69485)  Complete Medication List: 1)  Prodigy Autocode Blood Glucose Devi (Blood glucose monitoring suppl) .... One meter 2)  Aspirin 81 Mg Tbec (Aspirin) .... One by mouth every day 3)  Cvs Insulin Syringe 29g X 1/2" 1 Ml Misc (Insulin syringe-needle u-100) .... Quantity for once daily dosing - 1 month supply 4)  Prodigy Twist Top Lancets 28g Misc (Lancets) .... Use as directed 5)  Prodigy Blood Glucose Test Strp (Glucose blood) .... Use as directed 6)  Lantus 100 Unit/ml Soln (Insulin glargine) .... Inject 50 units in the morning and 30 units at night 7)  Multivitamins Tabs (Multiple vitamin) .... One daily 8)  Losartan Potassium-hctz 50-12.5 Mg Tabs (Losartan potassium-hctz) .... Take 1 tab by mouth daily 9)  Metoprolol Succinate 25 Mg Xr24h-tab (Metoprolol succinate) .... Take 1 tab by mouth daily for blood pressure  Patient Instructions: 1)  We will increase your Lants to 50 Units in the morning and 30 Units in the evening 2)  I am also going to add on another medicine for your blood pressure 3)  Please schedule a follow up appointment in 6-8 weeks to recheck your blood pressure Prescriptions: METOPROLOL SUCCINATE 25 MG XR24H-TAB (METOPROLOL SUCCINATE) Take 1 tab by mouth daily for blood pressure  #30 x 6   Entered and Authorized by:   Angelena Sole MD   Signed by:   Angelena Sole MD on 01/24/2010   Method used:   [facility] to        [facility] [phone]* (retail)       [address]       [place], [place]  [address]       Ph: [phone] or [phone]       Fax: [phone]   RxID:   [id number]    Orders Added: 1)  A1C-[facility] [83036] 2)  [facility]- Est  Level 4 [[address]008]    Laboratory Results   Blood Tests   Date/Time Received: January 24, 2010 2:51 PM  Date/Time Reported: January 24, 2010 3:07 PM   HGBA1C: >14.0%   (Normal Range:  Non-Diabetic - 3-6%   Control Diabetic - 6-8%)  Comments: .......test performed by........Marland Kitchen Garen Grams, LPN entered by Terese Door, CMA

## 2010-04-04 NOTE — Assessment & Plan Note (Signed)
Summary: F/U   DPG   Vital Signs:  Patient Profile:   55 Years Old Male Weight:      264 pounds Temp:     97.8 degrees F Pulse rate:   88 / minute BP sitting:   138 / 87  (left arm)  Pt. in pain?   no  Vitals Entered By: Jacki Cones RN (May 07, 2007 9:08 AM)                  Serial Vital Signs/Assessments:  Time      Position  BP       Pulse  Resp  Temp     By                     126/76                         Benn Moulder MD  Comments: manual By: Benn Moulder MD    Chief Complaint:  f/u diabetes.  History of Present Illness: HTN- Has started enalapril 5 mg daily, Norvasc increased to 10 mg last visit. tolerating medicines ok. No CP, SOB, HA, vision changes, dizziness.  DM- Started glipizide xl 5 mg last visit.  No hypoglyemia. fasting cbg's near 200.  last A1c last month 10.5. He was supposed to increase metformin to 1000 mg two times a day, but has only been taking 500 mg two times a day.  No polyuria, polydypsia.  Last eye exam 04/2006. He has not rescheduled appt.  LDL 100 at last recheck.  He admits his diet needs to improve.  He hopes to exercise more when the weather improves.    Current Allergies: No known allergies     Risk Factors:  Tobacco use:  quit    Year quit:  1994    Physical Exam  General:     Well-developed,well-nourished,in no acute distress; alert,appropriate and cooperative throughout examination Mouth:     pharynx pink and moist.   Neck:     no carotid bruits Lungs:     Normal respiratory effort, chest expands symmetrically. Lungs are clear to auscultation, no crackles or wheezes. Heart:     Normal rate and regular rhythm. S1 and S2 normal without gallop, murmur, click, rub or other extra sounds. Abdomen:     Bowel sounds positive,abdomen soft and non-tender without masses, organomegaly or hernias noted. Extremities:     no edema. no foot lesions/ulcerations    Impression & Recommendations:  Problem # 1:  HYPERTENSION,  BENIGN SYSTEMIC (ICD-401.1) Assessment: Improved at goal on manual recheck.  will check creatinine today as ace-i started 4 weeks ago.   His updated medication list for this problem includes:    Amlodipine Besylate 10 Mg Tabs (Amlodipine besylate) ..... One tablet daily    Enalapril Maleate 5 Mg Tabs (Enalapril maleate) .Marland Kitchen... 1 by mouth once daily  Orders: Basic Met-[facility] ([address]-66440) [facility]- Est  Level 4 (34742)   Problem # 2:  DIABETES MELLITUS II, UNCOMPLICATED (ICD-250.00) Assessment: Deteriorated discussed at length need to increase metformin to 1000 mg two times a day.  Will also increase glipizide to 10 mg daily w/ last a1c of 10.5.  If A1c not significantly improved at next check in 5/09 may need to start insulin.  Will make referral for eye appointment.  Also advised to increase diet and exercise with LDL of 100 close to goal (had been 78 in 11/08). His updated medication  list for this problem includes:    Metformin Hcl 1000 Mg Tabs (Metformin hcl) .Marland Kitchen... Take 1 tablet by mouth two times a day    Enalapril Maleate 5 Mg Tabs (Enalapril maleate) .Marland Kitchen... 1 by mouth once daily    Glipizide Xl 10 Mg Tb24 (Glipizide) .Marland Kitchen... 1 tablet by mouth daily  Orders: Basic Met-[facility] ([address]) Ophthalmology Referral (Ophthalmology) [facility]- Est  Level 4 (09811)   Complete Medication List: 1)  Onetouch Ultra Test Strp (Glucose blood) .... Use as directed 2)  Metformin Hcl 1000 Mg Tabs (Metformin hcl) .... Take 1 tablet by mouth two times a day 3)  Amlodipine Besylate 10 Mg Tabs (Amlodipine besylate) .... One tablet daily 4)  Enalapril Maleate 5 Mg Tabs (Enalapril maleate) .Marland Kitchen.. 1 by mouth once daily 5)  Glipizide Xl 10 Mg Tb24 (Glipizide) .Marland Kitchen.. 1 tablet by mouth daily   Patient Instructions: 1)  Diabetes- increase glipizide from 5 to 10 mg (new prescription sent).  Increase metformin to 1000 mg twice daily (so take 2 500 mg tablets twice a day, then get new prescription of 1000 mg 1 pill twice a  day) 2)  your blood pressure is much better, keep up the good work 3)  your cholesterol is borderline.  Increase exercise to 4-5 x per week, 30-50 minutes in duration.  Watch your diet. 4)  follow up with dr. Clelia Croft or dr. Seleta Rhymes in 1 month 5)  See your eye doctor yearly to check for diabetic eye damage.    Prescriptions: METFORMIN HCL 1000 MG TABS (METFORMIN HCL) Take 1 tablet by mouth two times a day  #60 x 3   Entered and Authorized by:   Benn Moulder MD   Signed by:   Benn Moulder MD on 05/07/2007   Method used:   Electronically sent to ...       [facility] [address] [phone]*       [address]       [place], [place]  [address]       Ph: [phone] or [phone]       Fax: [phone]   RxID:   [id number] GLIPIZIDE XL 10 MG TB24 (GLIPIZIDE) 1 tablet by mouth daily  #34 x 3   Entered and Authorized by:   Benn Moulder MD   Signed by:   Benn Moulder MD on 05/07/2007   Method used:   Electronically sent to ...       [facility] [address] [phone]*       [address]       [place], [place]  [address]       Ph: [phone] or [phone]       Fax: [phone]   RxID:   [id number]  ]

## 2010-04-04 NOTE — Assessment & Plan Note (Signed)
Summary: Rx clinic: DM and HTN follow up   Vital Signs:  Patient Profile:   55 Years Old Male Height:     73.25 inches Weight:      272.7 pounds Pulse rate:   85 / minute BP sitting:   149 / 94  (right arm)                 Chief Complaint:  Diabetes Management.  History of Present Illness: 55 yo male presents for DM follow up.  States he is doing well.  His 7 and 14 day average is 224 mg/dl.  He is taking Lantus 45 units a day and glipizide xr 10 mg 2 tabs a day.  He reports no hypoglycemic events.  Occassional blurred vision, resolving quickly.  He states his food supply is better.  He is not running out of it this month.  Still running or walking 1 to 2 hours a day.  His blood sugars after exercise are between 121 and 187.  He is eating 10 cookies for a snack every night.  But if he skips the cookies, his blood sugar is around 140.  Diabetes Management History:      The patient is a 55 years old male who comes in for evaluation of DM Type 2.  He is (or has been) enrolled in the "Diabetic Education Program".  He states understanding of dietary principles but he is not following the appropriate diet.  He is checking home blood sugars.  He says that he is exercising.  Type of exercise includes: running.  Duration of exercise is estimated to be 90 min.  He is doing this 3 times per week.        His home fasting blood sugars are as follows: highest: 296; lowest: 144; average: 250.  11:00 AM blood sugars include: highest: 293; lowest: 288.  His 4:00 PM blood sugars reveal: highest: 187; lowest: 121.       Current Allergies: No known allergies         Impression & Recommendations:  Problem # 1:  DIABETES MELLITUS II, UNCOMPLICATED (ICD-250.00) Assessment: Unchanged DM uncontrolled based on blood sugar.  His 7 day average is 224 mg/dl and 14 day average was 224 mg/dl.  His blood sugar this morning was 144, but he did not eat the 10 cookies he typically eats every night.  Will  increase Lantus to 47 units a day.  Instructed patient to decrease the amount of snack he has at night.  Gave a sample of lantus lot number [id number] exp date 4/11. Follow up in pharmacy clinic in 4 weeks.  Recommend a foot exam at next visit.  The following medications were removed from the medication list:    Lisinopril 40 Mg Tabs (Lisinopril) ..... One daily - this medication replaces your enalapril  His updated medication list for this problem includes:    Glipizide Xl 10 Mg Tb24 (Glipizide) .Marland Kitchen... 2 tablet by mouth daily    Aspirin 81 Mg Tbec (Aspirin) ..... One by mouth every day    Lantus 100 Unit/ml Soln (Insulin glargine) .Marland KitchenMarland KitchenMarland KitchenMarland Kitchen 47 units once daily in the morning. dispense 1 month supply    Lisinopril-hydrochlorothiazide 20-12.5 Mg Tabs (Lisinopril-hydrochlorothiazide) .Marland Kitchen... Take 2 tablets once a day.  [facility]   Problem # 2:  HYPERTENSION, BENIGN SYSTEMIC (ICD-401.1) Uncontrolled not at goal of less than 130/80.  Patient states not taking amlodipine but 2x a week because it makes him  feel achy.  Will DC amlodipine and lisinopril and start Lisinopril/HCT 40/25 mg one tablet a day.  Follow up in pharmacy clinic in 4 weeks.  The following medications were removed from the medication list:    Amlodipine Besylate 10 Mg Tabs (Amlodipine besylate) ..... One tablet daily    Lisinopril 40 Mg Tabs (Lisinopril) ..... One daily - this medication replaces your enalapril  His updated medication list for this problem includes:    Lisinopril-hydrochlorothiazide 20-12.5 Mg Tabs (Lisinopril-hydrochlorothiazide) .Marland Kitchen... Take 2 tablets once a day.  Orders: Reassessment Each 15 min unit- [facility] [phone])   Complete Medication List: 1)  Prodigy Autocode Blood Glucose Devi (Blood glucose monitoring suppl) .... One meter 2)  Glipizide Xl 10 Mg Tb24 (Glipizide) .... 2 tablet by mouth daily 3)  Aspirin 81 Mg Tbec (Aspirin) .... One by mouth every day 4)  Lantus 100  Unit/ml Soln (Insulin glargine) .... 47 units once daily in the morning. dispense 1 month supply 5)  Cvs Insulin Syringe 29g X 1/2" 1 Ml Misc (Insulin syringe-needle u-100) .... Quantity for once daily dosing - 1 month supply 6)  Prodigy Twist Top Lancets 28g Misc (Lancets) .... Use as directed 7)  Prodigy Blood Glucose Test Strp (Glucose blood) .... Use as directed 8)  Lisinopril-hydrochlorothiazide 20-12.5 Mg Tabs (Lisinopril-hydrochlorothiazide) .... Take 2 tablets once a day.  Diabetes Management Assessment/Plan:      The following lipid goals have been established for the patient: Total cholesterol goal of 200; LDL cholesterol goal of 100; HDL cholesterol goal of 40; Triglyceride goal of 200.  His blood pressure goal is < 130/80.     Patient Instructions: 1)  Please schedule a follow-up appointment in 1 month with pharmacy clinic. 2)  Check your blood sugars regularly. If your readings are usually above 300 or below 70 you should contact our office. 3)  It is important that your Diabetic A1c level is checked every 3 months. 4)  Increase your Lantus to 47 units a day. 5)  Stop your amlodipine (Norvasc) and lisinopril. 6)  Start Lisinopril/HCT 40/25 mg once a day. 7)  Decrease the number of cookies you eat to 5 instead of 10.   8)  Monitor your sugars after you exercise.  If they are below 80 mg/dl, call the office. 9)  Continue all your other medications   Prescriptions: LISINOPRIL-HYDROCHLOROTHIAZIDE 20-12.5 MG TABS (LISINOPRIL-HYDROCHLOROTHIAZIDE) Take 2 tablets once a day.  #60 x 6   Entered by:   Steve Rattler  Pharm D   Authorized by:   Lupita Raider MD   Signed by:   Steve Rattler  Pharm D on 03/25/2008   Method used:   Electronically to        [facility] [address] [phone]* (retail)       [address]       [place], [place]  [address]       Ph: [phone] or [phone]       Fax: [phone]   RxID:   [id number]

## 2010-04-04 NOTE — Progress Notes (Signed)
Summary: Diabetes Lantus F/U  Phone Note Outgoing Call   Call placed by: Mina Marble, PahrmD Call placed to: Patient Reason for Call: Discuss lab or test results Summary of Call: F/U of Lantus initiation on 11/18/07.  Patient has been taking 10 units each morning without any episodes of hypoglycemia and no administration problems. Fasting CBGs ranged 251-585.  Average AM CBGs = 400. Pateint advised to increase Lantus to 20 units daily.  Plan Follow-up via phone on 11/28/07.  Patient verbalized understanding of treatment plan.

## 2010-04-04 NOTE — Progress Notes (Signed)
Summary: Test strips  Phone Note Call from Patient Call back at Home Phone [phone]   Summary of Call: Pt is needing authorization on his test strips, states [facility] out [facility] [address] Initial call taken by: Haydee Salter,  January 16, 2007 9:03 AM      Prescriptions: Letta Pate ULTRA TEST  STRP (GLUCOSE BLOOD)   #100 x 6   Entered and Authorized by:   Lupita Raider MD   Signed by:   Lupita Raider MD on 01/17/2007   Method used:   Electronically sent to ...       [facility] [address] [phone]*       [address]       [place], [place]  [address]       Ph: [phone] or [phone]       Fax: [phone]   RxID:   [id number]

## 2010-04-04 NOTE — Assessment & Plan Note (Signed)
Summary: F/U Diabetes - Rx Clinic   Vital Signs:  Patient profile:   55 year old male Weight:      267 pounds BMI:     37.37  History of Present Illness: Patient reports less nocturia and improved blood sugars.   He states he is feeling a little better with his insulin change last week.   Home CBGs in last few days have been - 7 day Avg 268 (improved from last visit of 7 day avg 308).   He notes a decrease in nocturnal urination to 2x/night (down from 3-4x/night).  He appears to have less fatigue and is willing to exercise more.     Habits & Providers  Alcohol-Tobacco-Diet     Tobacco Status: never  Current Medications (verified): 1)  Prodigy Autocode Blood Glucose  Devi (Blood Glucose Monitoring Suppl) .... One Meter 2)  Aspirin 81 Mg  Tbec (Aspirin) .... One By Mouth Every Day 3)  Cvs Insulin Syringe 29g X 1/2" 1 Ml Misc (Insulin Syringe-Needle U-100) .... Quantity For Once Daily Dosing - 1 Month Supply 4)  Prodigy Twist Top Lancets 28g  Misc (Lancets) .... Use As Directed 5)  Prodigy Blood Glucose Test  Strp (Glucose Blood) .... Use As Directed 6)  Lisinopril-Hydrochlorothiazide 20-12.5 Mg Tabs (Lisinopril-Hydrochlorothiazide) .... Take One Daily 7)  Lantus 100 Unit/ml Soln (Insulin Glargine) .... Inject 30 Units Twice Daily. Dispense One Month Supply 8)  Novolog 100 Unit/ml Soln (Insulin Aspart) .... 30 Units Twice Daily Prior To Meals. Dispense One Month Supply. 9)  Multivitamins  Tabs (Multiple Vitamin) .... One Daily  Allergies (verified): No Known Drug Allergies   Impression & Recommendations:  Problem # 1:  DIABETES MELLITUS II, UNCOMPLICATED (ICD-250.00) Assessment Improved  Improved control of diabetes, currently taking Novolog 20units prior to meals and Lantus 30units two times a day.  Instructed patient to increase Novolog to 30 units prior to meals.  He will need continued support for increasing exercise.  I believe he is much improved since starting meal  time insulin.  He will continue on Lantus 30 units twice daily prior to meals. Patient able to verbalize hypoglycemia management plan and is aware that he should call if his blood sugar readings are > 80 more than once.  F/up already scheduled with Dr. Lelon Perla on 10/31/2008.  TTFFC 15 minutes.  Seen today with Dierdre Highman, PharmD Candidate, AND [facility] Resident.  His updated medication list for this problem includes:    Aspirin 81 Mg Tbec (Aspirin) ..... One by mouth every day    Lisinopril-hydrochlorothiazide 20-12.5 Mg Tabs (Lisinopril-hydrochlorothiazide) .Marland Kitchen... Take one daily    Lantus 100 Unit/ml Soln (Insulin glargine) ..... Inject 30 units twice daily. dispense one month supply    Novolog 100 Unit/ml Soln (Insulin aspart) .Marland KitchenMarland KitchenMarland KitchenMarland Kitchen 30 units twice daily prior to meals. dispense one month supply.  Orders: Reassessment Each 15 min unit- [facility] [phone])  Complete Medication List: 1)  Prodigy Autocode Blood Glucose Devi (Blood glucose monitoring suppl) .... One meter 2)  Aspirin 81 Mg Tbec (Aspirin) .... One by mouth every day 3)  Cvs Insulin Syringe 29g X 1/2" 1 Ml Misc (Insulin syringe-needle u-100) .... Quantity for once daily dosing - 1 month supply 4)  Prodigy Twist Top Lancets 28g Misc (Lancets) .... Use as directed 5)  Prodigy Blood Glucose Test Strp (Glucose blood) .... Use as directed 6)  Lisinopril-hydrochlorothiazide 20-12.5 Mg Tabs (Lisinopril-hydrochlorothiazide) .... Take one daily 7)  Lantus 100 Unit/ml Soln (Insulin glargine) .... Inject  30 units twice daily. dispense one month supply 8)  Novolog 100 Unit/ml Soln (Insulin aspart) .... 30 units twice daily prior to meals. dispense one month supply. 9)  Multivitamins Tabs (Multiple vitamin) .... One daily  Patient Instructions: 1)  Walk most days per week - five days per week.  2)  Change Novolog to 30 units prior to meals  3)  Continue Lantus 30 units twice daily 4)  Restart baby aspirin 81 mg once daily 5)   Start multivitamin daily Prescriptions: MULTIVITAMINS  TABS (MULTIPLE VITAMIN) one daily  #1 x 0   Entered and Authorized by:   Christian Mate D   Signed by:   Madelon Lips Pharm D on 10/26/2008   Method used:   Historical   RxID:   [id number] NOVOLOG 100 UNIT/ML SOLN (INSULIN ASPART) 30 units twice daily prior to meals. Dispense one month supply.  #1 x 0   Entered and Authorized by:   Christian Mate D   Signed by:   Madelon Lips Pharm D on 10/26/2008   Method used:   Historical   RxID:   [id number]   Prevention & Chronic Care Immunizations   Influenza vaccine: none available  (05/19/2008)   Influenza vaccine due: 05/19/2009    Tetanus booster: 10/03/2004: Done.   Tetanus booster due: 10/04/2014    Pneumococcal vaccine: Not documented  Colorectal Screening   Hemoccult: normal  (07/08/2007)   Hemoccult due: 07/07/2008    Colonoscopy: Diverticula. Medium hemorrhoids. Done at [facility] by Dr Elnoria Howard.  (08/28/2007)   Colonoscopy action/deferral: Repeat colonoscopy in 10 years.    (08/28/2007)   Colonoscopy due: 09/2017  Other Screening   PSA: 0.37  (07/08/2007)   PSA due due: 07/07/2008   Smoking status: never  (10/26/2008)  Diabetes Mellitus   HgbA1C: >14.0%  (09/30/2008)   Hemoglobin A1C due: 08/13/2008    Eye exam: rare microaneurysm bilaterally  (06/20/2007)   Eye exam due: 06/19/2008    Foot exam: yes  (08/11/2007)   High risk foot: Not documented   Foot care education: completed  (05/19/2008)   Foot exam due: 08/10/2008    Urine microalbumin/creatinine ratio: Not documented   Urine microalbumin/cr due: Not Indicated    Diabetes flowsheet reviewed?: Yes   Progress toward A1C goal: Improved  Lipids   Total Cholesterol: 161  (07/08/2007)   LDL: 88  (07/08/2007)   LDL Direct: 100  (04/07/2007)   HDL: 43  (07/08/2007)   Triglycerides: 150  (07/08/2007)  Hypertension   Last Blood Pressure: 147 / 100  (10/08/2008)   Serum  creatinine: 1.26  (04/22/2008)   Serum potassium 4.0  (07/08/2008)    Hypertension flowsheet reviewed?: Yes   Progress toward BP goal: Deteriorated  Self-Management Support :   Personal Goals (by the next clinic visit) :     Personal A1C goal: 8  (10/08/2008)     Personal blood pressure goal: 130/80  (10/08/2008)     Personal LDL goal: 70  (10/08/2008)    Patient will work on the following items until the next clinic visit to reach self-care goals:     Medications and monitoring: check my blood sugar, bring all of my medications to every visit  (10/26/2008)     Eating: drink diet soda or water instead of juice or soda, eat more vegetables  (10/26/2008)     Activity: take a 30 minute walk every day  (10/26/2008)    Diabetes self-management support: Written self-care plan  (10/26/2008)  Diabetes care plan printed   Last diabetes self-management training by diabetes educator: completed    Hypertension self-management support: Written self-care plan  (10/26/2008)   Hypertension self-care plan printed.    Hypertension self-management support not done because: Good outcomes  (10/08/2008)

## 2010-04-04 NOTE — Assessment & Plan Note (Signed)
Summary: DNKA               

## 2010-04-04 NOTE — Assessment & Plan Note (Signed)
Summary: f/u eo   Vital Signs:  Patient profile:   55 year old male Height:      71 inches Weight:      256.7 pounds BMI:     35.93 Temp:     97.9 degrees F oral Pulse rate:   89 / minute BP sitting:   138 / 87  (left arm) Cuff size:   large  Vitals Entered By: Garen Grams LPN (Jul 14, 2009 10:19 AM) CC: f/u dm and bp Is Patient Diabetic? Yes Did you bring your meter with you today? No Pain Assessment Patient in pain? no        CC:  f/u dm and bp.  History of Present Illness: 1. DMII:  Pt is not taking his Insulin as prescribed.  He is maybe taking 50 Units of Lantus in the morning but never takes it twice a day.  His sugars are still high  ~ 300.  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      ROS: denies any skin breakdown, focal numbness, weakness, vision changes  2. HTN: Pt is not taking his bp medicines as prescribed.  He usually only takes it every other day.  He thinks that it is causing him to have bilateral knee pain so he doesn't take it every day      ROS: pt denies any chest pain, shortness of breath  3. Knee pain:  Pt has bilateral knee pain that seems to be getting progressively worse.  It has really been bothering him since he started jogging.  He thinks that it is from the BP medicine.  it is a diffuse pain in both legs that gets worse when he is walking or jogging.  At other times of day it is a dull achey pain.  He hasn't been taking anything for the pain.  Rest makes it better.  Habits & Providers  Alcohol-Tobacco-Diet     Tobacco Status: never  Current Medications (verified): 1)  Prodigy Autocode Blood Glucose  Devi (Blood Glucose Monitoring Suppl) .... One Meter 2)  Aspirin 81 Mg  Tbec (Aspirin) .... One By Mouth Every Day 3)  Cvs Insulin Syringe 29g X 1/2" 1 Ml Misc (Insulin Syringe-Needle U-100) .... Quantity For Once Daily Dosing - 1 Month Supply 4)  Prodigy Twist Top Lancets 28g  Misc (Lancets) .... Use As Directed 5)  Prodigy Blood Glucose  Test  Strp (Glucose Blood) .... Use As Directed 6)  Lantus 100 Unit/ml Soln (Insulin Glargine) .... Inject 50 Units in The Morning and 25 Units in The Evening 7)  Multivitamins  Tabs (Multiple Vitamin) .... One Daily 8)  Losartan Potassium-Hctz 50-12.5 Mg Tabs (Losartan Potassium-Hctz) .... Take 1 Tab By Mouth Daily  Allergies: No Known Drug Allergies  Past History:  Past Medical History: Reviewed history from 05/24/2009 and no changes required. - T2DM - HTN - ?schizophrenia - nothing has been documented but pt often reacts to something in the room that I don't appreciate and refers to himself as "we" a lot. odd behaviors like making strange noises and washing hands in bleach - h/o elevated LFTs in Feb 2008 with neg hep panel and neg abd [place] except for fatty liver - FU LFTS 2008 normal  Social History: Reviewed history from 05/24/2009 and no changes required. Pt lives alone, Former 12 pack year smoker, former heavy drinker, no illicts; On disability--but pt won't elaborate. Jogs for exercise.  Denies tobacco, alcohol, or drug use  Physical  Exam  General:  Vitals reviewed. obese AAM,in no acute distress; alert,appropriate and cooperative throughout examination. has odd movements and facial expressions.  Eyes:  vision grossly intact, pupils equal, pupils round, pupils reactive to light, and no retinal abnormalitiies.   Neck:  no carotid bruits Lungs:  normal respiratory effort, no crackles, and no wheezes.   Heart:  normal rate, regular rhythm, no murmur, and no gallop.   Abdomen:  Bowel sounds positive,abdomen soft and non-tender without masses Msk:  R knee:  no joint swelling, redness.  Full ROM.  No joint line tenderness  L knee: no joint swelling, redness.  Full ROM.  No joint line tenderness Extremities:  no edema.  Psych:  normally interactive and good eye contact.  Has some odd facial movements and expressions   Impression & Recommendations:  Problem # 1:  DIABETES MELLITUS  II, UNCOMPLICATED (ICD-250.00) Assessment Unchanged Remain non compliant with his Insulin.  Have spent extensive discussions with him on the importance of taking it.  He suggested taking 50 U in the morning and 25 U in the evening.  Will try this since it was his idea and maybe he will take it. His updated medication list for this problem includes:    Aspirin 81 Mg Tbec (Aspirin) ..... One by mouth every day    Lantus 100 Unit/ml Soln (Insulin glargine) ..... Inject 50 units in the morning and 25 units in the evening    Losartan Potassium-hctz 50-12.5 Mg Tabs (Losartan potassium-hctz) .Marland Kitchen... Take 1 tab by mouth daily  Orders: A1C-[facility] (38756) [facility]- Est  Level 4 (43329)  Problem # 2:  HYPERTENSION, BENIGN SYSTEMIC (ICD-401.1) Assessment: Unchanged  Not taking his medicines as prescribed.  Not Losartan is not likely the cause of knee pain.  Encouraged him to take his medicines everyday. His updated medication list for this problem includes:    Losartan Potassium-hctz 50-12.5 Mg Tabs (Losartan potassium-hctz) .Marland Kitchen... Take 1 tab by mouth daily  Orders: [facility]- Est  Level 4 (51884)  Problem # 3:  KNEE PAIN, BILATERAL (ICD-719.46) Assessment: New  Likely from arthritis or overuse injury from walking / jogging.  Told him that he can take Tylenol / Ibuprofen as needed for pain.  Also told him that he can use knee braces and see if that helps. His updated medication list for this problem includes:    Aspirin 81 Mg Tbec (Aspirin) ..... One by mouth every day  Orders: [facility]- Est  Level 4 (16606)  Complete Medication List: 1)  Prodigy Autocode Blood Glucose Devi (Blood glucose monitoring suppl) .... One meter 2)  Aspirin 81 Mg Tbec (Aspirin) .... One by mouth every day 3)  Cvs Insulin Syringe 29g X 1/2" 1 Ml Misc (Insulin syringe-needle u-100) .... Quantity for once daily dosing - 1 month supply 4)  Prodigy Twist Top Lancets 28g Misc (Lancets) .... Use as directed 5)  Prodigy Blood Glucose Test Strp  (Glucose blood) .... Use as directed 6)  Lantus 100 Unit/ml Soln (Insulin glargine) .... Inject 50 units in the morning and 25 units in the evening 7)  Multivitamins Tabs (Multiple vitamin) .... One daily 8)  Losartan Potassium-hctz 50-12.5 Mg Tabs (Losartan potassium-hctz) .... Take 1 tab by mouth daily  Patient Instructions: 1)  Your blood sugars are not under control.  Your A1C is very high so we need to do better with your blood sugars.  Lets try 50 Units of Lantus in the morning and 25 Units in the evening. 2)  Your blood  pressure is okay right now.  Please try and take your medicines everyday. 3)  You can take Tylenol / Ibuprofen as needed for your knee pain.  You can also use knee braces and see if that helps 4)  Please schedule a follow up appointment in 6-8 weeks  Laboratory Results   Blood Tests   Date/Time Received: Jul 14, 2009 10:14 AM  Date/Time Reported: Jul 14, 2009 10:24 AM   HGBA1C: >14.0%   (Normal Range: Non-Diabetic - 3-6%   Control Diabetic - 6-8%)  Comments: ...........test performed by...........Marland KitchenGaren Grams, LPN entered by Terese Door, CMA         Prevention & Chronic Care Immunizations   Influenza vaccine: none available  (05/19/2008)   Influenza vaccine due: 05/19/2009    Tetanus booster: 10/03/2004: Done.   Tetanus booster due: 10/04/2014    Pneumococcal vaccine: Not documented  Colorectal Screening   Hemoccult: normal  (07/08/2007)   Hemoccult due: 07/07/2008    Colonoscopy: Diverticula. Medium hemorrhoids. Done at [facility] by Dr Elnoria Howard.  (08/28/2007)   Colonoscopy action/deferral: Repeat colonoscopy in 10 years.    (08/28/2007)   Colonoscopy due: 09/2017  Other Screening   PSA: 0.37  (07/08/2007)   PSA due due: 07/07/2008   Smoking status: never  (07/14/2009)  Diabetes Mellitus   HgbA1C: >14.0  (07/14/2009)   Hemoglobin A1C due: 08/13/2008    Eye exam: rare microaneurysm bilaterally  (06/20/2007)   Diabetic  eye exam action/deferral: Ophthalmology referral  (12/01/2008)   Eye exam due: 06/19/2008    Foot exam: yes  (08/11/2007)   High risk foot: Not documented   Foot care education: completed  (05/19/2008)   Foot exam due: 08/10/2008    Urine microalbumin/creatinine ratio: Not documented   Urine microalbumin/cr due: Not Indicated    Diabetes flowsheet reviewed?: Yes   Progress toward A1C goal: Unchanged  Lipids   Total Cholesterol: 148  (12/01/2008)   Lipid panel action/deferral: Lipid Panel ordered   LDL: 71  (12/01/2008)   LDL Direct: 100  (04/07/2007)   HDL: 34  (12/01/2008)   Triglycerides: 215  (12/01/2008)  Hypertension   Last Blood Pressure: 138 / 87  (07/14/2009)   Serum creatinine: 1.09  (06/15/2009)   BMP action: Ordered   Serum potassium 3.6  (06/15/2009)    Hypertension flowsheet reviewed?: Yes   Progress toward BP goal: Unchanged  Self-Management Support :   Personal Goals (by the next clinic visit) :     Personal A1C goal: 10  (05/24/2009)     Personal blood pressure goal: 130/80  (10/08/2008)     Personal LDL goal: 100  (02/28/2009)    Diabetes self-management support: Written self-care plan  (10/26/2008)   Last diabetes self-management training by diabetes educator: completed    Hypertension self-management support: Written self-care plan  (10/26/2008)    Hypertension self-management support not done because: Good outcomes  (10/08/2008)

## 2010-04-04 NOTE — Progress Notes (Signed)
Summary: Test strips  Phone Note Call from Patient Call back at [facility] Phone [phone]   Summary of Call: isn. will not cover test strips....would like to discuss with rn Initial call taken by: Haydee Salter,  February 10, 2008 8:42 AM  Follow-up for Phone Call        Medicaid now only covers Prodigy test strips, lancets and meters. Needs rx for all 3 of these sent to [facility] Follow-up by: ASHA BENTON LPN,  February 10, 2008 8:52 AM  Additional Follow-up for Phone Call Additional follow up Details #1::        Pt informed that scripts have been sent in Additional Follow-up by: ASHA BENTON LPN,  February 10, 2008 9:25 AM    New/Updated Medications: PRODIGY AUTOCODE BLOOD GLUCOSE  DEVI (BLOOD GLUCOSE MONITORING SUPPL) one meter PRODIGY TWIST TOP LANCETS 28G  MISC (LANCETS) use as directed PRODIGY BLOOD GLUCOSE TEST  STRP (GLUCOSE BLOOD) use as directed   Prescriptions: PRODIGY BLOOD GLUCOSE TEST  STRP (GLUCOSE BLOOD) use as directed  #100 x 11   Entered and Authorized by:   Lupita Raider MD   Signed by:   Lupita Raider MD on 02/10/2008   Method used:   [facility]        [facility] [address] [phone]* (retail)       [address]       [place], [place]  [address]       Ph: [phone] or [phone]       Fax: [phone]   RxID:   [id number] PRODIGY TWIST TOP LANCETS 28G  MISC (LANCETS) use as directed  #100 x 11   Entered and Authorized by:   Lupita Raider MD   Signed by:   Lupita Raider MD on 02/10/2008   Method used:   [facility]        [facility] [address] [phone]* (retail)       [address]       [place], [place]  [address]       Ph: [phone] or [phone]       Fax: [phone]   RxID:   [id number] PRODIGY AUTOCODE BLOOD GLUCOSE  DEVI (BLOOD GLUCOSE MONITORING SUPPL) one meter  #1 x 0   Entered and Authorized by:   Lupita Raider MD   Signed by:   Lupita Raider MD  on 02/10/2008   Method used:   [facility]ically to        [facility] [address] [phone]* (retail)       [address]       [place], [place]  [address]       Ph: [phone] or [phone]       Fax: [phone]   RxID:   [id number]

## 2010-04-04 NOTE — Assessment & Plan Note (Signed)
Summary: FU DM, HTN, obesity   Vital Signs:  Patient Profile:   55 Years Old Male Height:     73.5 inches Weight:      263.5 pounds BMI:     34.42 Temp:     98.1 degrees F oral Pulse rate:   77 / minute BP sitting:   171 / 90  (left arm) Cuff size:   large  Pt. in pain?   no  Vitals Entered By: Garen Grams LPN (December 17, 2007 10:12 AM)                  Chief Complaint:  f/u visit DM and HTN.  History of Present Illness: 55yr old obese AAM presents to FU on :  1) DM - Has recently been started on Lantus. He reports taking 30U qam without side effect. He denies any lows since last visit but does report he will drop into the 50s if he doesn't eat. He reports his CBGs in the morning have been running in the 200s but he says this is from eating cookies and sandwiches late at night.  He has not been exercising like he generally does and has gained 15 lbs in the last month. He finally admits to not taking the metformin because it "makes my back hurt."  He is taking the glipizide. Januvia was stopped. He is out of his test strips.  2) HTN - Reports not taking his BP meds regularly because they "make me feel bad. i can't describe it."  He denies CP, SOB, DOE, peripheral edema, dizziness, syncope. Not exercising anymore much.      Current Allergies (reviewed today): No known allergies   Past Medical History:    Reviewed history from 10/29/2007 and no changes required:       - T2DM       - HTN       - ?MR vs schizophrenia - nothing has been documented but pt often reacts to something in the room that I don't appreciate and refers to himself as "we" a lot. odd behaviors like making strange noises and washing hands in bleach       - h/o elevated LFTs in Feb 2008 with neg hep panel and neg abd [place] except for fatty liver - FU LFTS 2008 normal   Family History:    Reviewed history from 01/07/2007 and no changes required:       Dad died w/ CVA in 38s; Mom died of unknown Cancer;  siblings generally healthy except 2 with unknown cancer. No MI or CVA <65 in family.   Social History:    Reviewed history from 10/29/2007 and no changes required:       Pt lives alone, Former 12 pack year smoker, former heavy drinker, no illicts; On disability--but pt won't elaborate. Has odd affect and ?MR vs schizoaffective d/o. Exercises 1hour daily with weight lighting and jogging. Washes his hands with bleach.      Physical Exam  General:     obese AAM,in no acute distress; alert,appropriate and cooperative throughout examination. has odd movements and facial expressions.  Lungs:     Normal respiratory effort, chest expands symmetrically. Lungs are clear to auscultation, no crackles or wheezes. Heart:     Normal rate and regular rhythm. S1 and S2 normal without gallop, murmur, click, rub or other extra sounds. Pulses:     2+ dp and pt pulses Extremities:     no edema.  Psych:  normally interactive and good eye contact.  Has some odd facial movements and almost mouths words to himself during our interview.     Impression & Recommendations:  Problem # 1:  DIABETES MELLITUS II, UNCOMPLICATED (ICD-250.00) Assessment: Unchanged CBGs in the 200s but he is out of test strips and I wonder how much he's actually been checking or even taking his lantus.  He isn't taking the metformin.  I advised to take meds as prescribed and return to diet / exercise as he has been counseled many times.   His updated medication list for this problem includes:    Metformin Hcl 1000 Mg Tabs (Metformin hcl) .Marland Kitchen... Take 1 tablet by mouth two times a day    Enalapril Maleate 20 Mg Tabs (Enalapril maleate) .Marland Kitchen... 1 tablet daily    Glipizide Xl 10 Mg Tb24 (Glipizide) .Marland Kitchen... 2 tablet by mouth daily    Aspirin 81 Mg Tbec (Aspirin) ..... One by mouth every day    Lantus 100 Unit/ml Soln (Insulin glargine) .Marland KitchenMarland KitchenMarland KitchenMarland Kitchen 30 units once daily in the morning  Orders: [facility]- Est  Level 4 (09323)   Problem # 2:   HYPERTENSION, BENIGN SYSTEMIC (ICD-401.1) Assessment: Deteriorated Not taking his meds. We discussed why HTN is so dangerous in DM. Encouraged to restart his meds.   His updated medication list for this problem includes:    Amlodipine Besylate 10 Mg Tabs (Amlodipine besylate) ..... One tablet daily    Enalapril Maleate 20 Mg Tabs (Enalapril maleate) .Marland Kitchen... 1 tablet daily  Orders: [facility]- Est  Level 4 (55732)   Problem # 3:  OBESITY, UNSPECIFIED (ICD-278.00) Assessment: Deteriorated Has gained 15lbs. Advised to follow diet and return to exercise.  Orders: [facility]- Est  Level 4 (20254)   Complete Medication List: 1)  Onetouch Ultra Test Strp (Glucose blood) .... Test once daily before breakfast.  may substitute brands if necessary. 2)  Metformin Hcl 1000 Mg Tabs (Metformin hcl) .... Take 1 tablet by mouth two times a day 3)  Amlodipine Besylate 10 Mg Tabs (Amlodipine besylate) .... One tablet daily 4)  Enalapril Maleate 20 Mg Tabs (Enalapril maleate) .Marland Kitchen.. 1 tablet daily 5)  Glipizide Xl 10 Mg Tb24 (Glipizide) .... 2 tablet by mouth daily 6)  Aspirin 81 Mg Tbec (Aspirin) .... One by mouth every day 7)  Lantus 100 Unit/ml Soln (Insulin glargine) .... 30 units once daily in the morning 8)  [facility]vs Insulin Syringe 29g X 1/2" 1 Ml Misc (Insulin syringe-needle u-100) .... Quantity for once daily dosing - 1 month supply   Patient Instructions: 1)  Your blood pressure is high today because you are not taking your medicines and because you have gained 15 pounds since I saw you last. It is very important to take your medicines and to get back to exercising like you were. I thnk this will make you feel better. 2)  Continue taking your Lantus, Metformin, and Glipizide and follow up with Dr Raymondo Band as already scheduled.    Prescriptions: ONETOUCH ULTRA TEST  STRP (GLUCOSE BLOOD) test once daily before breakfast.  May substitute brands if necessary.  #100 x 2   Entered and Authorized by:   Lupita Raider MD    Signed by:   Lupita Raider MD on 12/17/2007   Method used:   [facility]        [facility] [address] [phone]* (retail)       [address]       [place]  [place], [place]  [address]       Ph: [phone] or [phone]       Fax: [phone]   RxID:   [id number]  ]

## 2010-04-04 NOTE — Assessment & Plan Note (Signed)
Summary: FU/KH   Vital Signs:  Patient profile:   55 year old male Height:      71 inches Weight:      259 pounds BMI:     36.25 BSA:     2.36 Temp:     98.2 degrees F Pulse rate:   93 / minute BP sitting:   143 / 96  Vitals Entered By: Jone Baseman CMA (June 15, 2009 3:09 PM) CC: F/U DM AND BP Is Patient Diabetic? Yes Did you bring your meter with you today? No Pain Assessment Patient in pain? no        CC:  F/U DM AND BP.  History of Present Illness: 1. DMII:  Pt has been taking his Insulin as prescribed and was able to repeat the new regimen that we started last time.  He states that he only misses an occassional dose but is overall pretty good about taking it.  He is checking his sugars fairly regular, and they are still elevated.  Averaging around 330.      ROS: denies numbness / weakness, vision changes  2. HTN:  Pt has not been taking his medicines as prescribed.  He states that he probbably only takes it everyother day.  He thinks that he might be having a side effect from the medicine.  He thinks that he is getting cramps in his legs.  He doesn't check his blood pressure at home regularly.      ROS: denies chest pain, shortness of breath.  Habits & Providers  Alcohol-Tobacco-Diet     Tobacco Status: never  Current Medications (verified): 1)  Prodigy Autocode Blood Glucose  Devi (Blood Glucose Monitoring Suppl) .... One Meter 2)  Aspirin 81 Mg  Tbec (Aspirin) .... One By Mouth Every Day 3)  Cvs Insulin Syringe 29g X 1/2" 1 Ml Misc (Insulin Syringe-Needle U-100) .... Quantity For Once Daily Dosing - 1 Month Supply 4)  Prodigy Twist Top Lancets 28g  Misc (Lancets) .... Use As Directed 5)  Prodigy Blood Glucose Test  Strp (Glucose Blood) .... Use As Directed 6)  Lantus 100 Unit/ml Soln (Insulin Glargine) .... Inject 50 Units Twice Daily. Dispense One Month Supply 7)  Multivitamins  Tabs (Multiple Vitamin) .... One Daily 8)  Losartan Potassium-Hctz 50-12.5 Mg  Tabs (Losartan Potassium-Hctz) .... Take 1 Tab By Mouth Daily  Allergies: No Known Drug Allergies  Past History:  Past Medical History: Reviewed history from 05/24/2009 and no changes required. - T2DM - HTN - ?schizophrenia - nothing has been documented but pt often reacts to something in the room that I don't appreciate and refers to himself as "we" a lot. odd behaviors like making strange noises and washing hands in bleach - h/o elevated LFTs in Feb 2008 with neg hep panel and neg abd [place] except for fatty liver - FU LFTS 2008 normal  Social History: Reviewed history from 05/24/2009 and no changes required. Pt lives alone, Former 12 pack year smoker, former heavy drinker, no illicts; On disability--but pt won't elaborate. Jogs for exercise.  Denies tobacco, alcohol, or drug use  Physical Exam  General:  Vitals reviewed. obese AAM,in no acute distress; alert,appropriate and cooperative throughout examination. has odd movements and facial expressions.  Lungs:  normal respiratory effort, no crackles, and no wheezes.   Heart:  normal rate, regular rhythm, no murmur, and no gallop.   Abdomen:  Bowel sounds positive,abdomen soft and non-tender without masses Extremities:  no edema.  Psych:  normally interactive  and good eye contact.  Has some odd facial movements and expressions   Impression & Recommendations:  Problem # 1:  DIABETES MELLITUS II, UNCOMPLICATED (ICD-250.00) Assessment Unchanged  Will increase Lantus to 50 U two times a day except for when he is jogging he'll take 25 U. His updated medication list for this problem includes:    Aspirin 81 Mg Tbec (Aspirin) ..... One by mouth every day    Lantus 100 Unit/ml Soln (Insulin glargine) ..... Inject 50 units twice daily. dispense one month supply    Losartan Potassium-hctz 50-12.5 Mg Tabs (Losartan potassium-hctz) .Marland Kitchen... Take 1 tab by mouth daily  Orders: [facility]- Est Level  3 (72536)  Problem # 2:  HYPERTENSION, BENIGN SYSTEMIC  (ICD-401.1) Assessment: Deteriorated Encourage him the importance of him taking his medicines as prescribed.  Will check BMET today. His updated medication list for this problem includes:    Losartan Potassium-hctz 50-12.5 Mg Tabs (Losartan potassium-hctz) .Marland Kitchen... Take 1 tab by mouth daily  Orders: T-Basic Metabolic Panel [phone]) [facility]- Est Level  3 (95638)  Complete Medication List: 1)  Prodigy Autocode Blood Glucose Devi (Blood glucose monitoring suppl) .... One meter 2)  Aspirin 81 Mg Tbec (Aspirin) .... One by mouth every day 3)  Cvs Insulin Syringe 29g X 1/2" 1 Ml Misc (Insulin syringe-needle u-100) .... Quantity for once daily dosing - 1 month supply 4)  Prodigy Twist Top Lancets 28g Misc (Lancets) .... Use as directed 5)  Prodigy Blood Glucose Test Strp (Glucose blood) .... Use as directed 6)  Lantus 100 Unit/ml Soln (Insulin glargine) .... Inject 50 units twice daily. dispense one month supply 7)  Multivitamins Tabs (Multiple vitamin) .... One daily 8)  Losartan Potassium-hctz 50-12.5 Mg Tabs (Losartan potassium-hctz) .... Take 1 tab by mouth daily  Patient Instructions: 1)  We are going to increase the Lantus to 50 U twice a day and only 25 U when you go for a jog. 2)  I am going to check your electrolytes today because you are complaining of cramps 3)  It is important for you to take your blood pressure medicines everyday 4)  Please schedule a follow up appointment in 4 weeks Prescriptions: LANTUS 100 UNIT/ML SOLN (INSULIN GLARGINE) Inject 50 units twice daily. Dispense one month supply  #1 x 6   Entered and Authorized by:   Angelena Sole MD   Signed by:   Angelena Sole MD on 06/15/2009   Method used:   [facility] to        [facility] [address] [phone]* (retail)       [address]       [place], [place]  [address]       Ph: [phone] or [phone]       Fax: [phone]   RxID:   [id number]   Prevention & Chronic  Care Immunizations   Influenza vaccine: none available  (05/19/2008)   Influenza vaccine due: 05/19/2009    Tetanus booster: 10/03/2004: Done.   Tetanus booster due: 10/04/2014    Pneumococcal vaccine: Not documented  Colorectal Screening   Hemoccult: normal  (07/08/2007)   Hemoccult due: 07/07/2008    Colonoscopy: Diverticula. Medium hemorrhoids. Done at [facility] by Dr Elnoria Howard.  (08/28/2007)   Colonoscopy action/deferral: Repeat colonoscopy in 10 years.    (08/28/2007)   Colonoscopy due: 09/2017  Other Screening   PSA: 0.37  (07/08/2007)   PSA due due: 07/07/2008   Smoking status: never  (  06/15/2009)  Diabetes Mellitus   HgbA1C: >14.0  (04/11/2009)   Hemoglobin A1C due: 08/13/2008    Eye exam: rare microaneurysm bilaterally  (06/20/2007)   Diabetic eye exam action/deferral: Ophthalmology referral  (12/01/2008)   Eye exam due: 06/19/2008    Foot exam: yes  (08/11/2007)   High risk foot: Not documented   Foot care education: completed  (05/19/2008)   Foot exam due: 08/10/2008    Urine microalbumin/creatinine ratio: Not documented   Urine microalbumin/cr due: Not Indicated    Diabetes flowsheet reviewed?: Yes   Progress toward A1C goal: Unchanged  Lipids   Total Cholesterol: 148  (12/01/2008)   Lipid panel action/deferral: Lipid Panel ordered   LDL: 71  (12/01/2008)   LDL Direct: 100  (04/07/2007)   HDL: 34  (12/01/2008)   Triglycerides: 215  (12/01/2008)  Hypertension   Last Blood Pressure: 143 / 96  (06/15/2009)   Serum creatinine: 1.26  (04/22/2008)   BMP action: Ordered   Serum potassium 4.0  (07/08/2008)    Hypertension flowsheet reviewed?: Yes   Progress toward BP goal: Unchanged  Self-Management Support :   Personal Goals (by the next clinic visit) :     Personal A1C goal: 10  (05/24/2009)     Personal blood pressure goal: 130/80  (10/08/2008)     Personal LDL goal: 100  (02/28/2009)    Diabetes self-management support: Written  self-care plan  (10/26/2008)   Last diabetes self-management training by diabetes educator: completed    Hypertension self-management support: Written self-care plan  (10/26/2008)    Hypertension self-management support not done because: Good outcomes  (10/08/2008)

## 2010-04-04 NOTE — Assessment & Plan Note (Signed)
Summary: F/U Diabetes - Rx Clinic   Vital Signs:  Patient profile:   55 year old male Weight:      271 pounds BMI:     37.93 Pulse rate:   109 / minute BP sitting:   147 / 100  (left arm)  History of Present Illness: Patient arrives after walking here today from home.  He arrives very swearty as the temperature is nearly 90.     Patient reports "running" for nearly 2 hours yesterday when the temperature was in the high 90s.    Patient reports less nocturia and some improved blood sugars.   He states he is feeling a little better with his insulin change last week.   Home CBGs in last few days have been - 7 day Avg 308 BUT recent 3 days have all been < 300.  Patient reports having food and feeling more like exercising.  He notes a decrease in nocturnal urination.   Current Medications (verified): 1)  Prodigy Autocode Blood Glucose  Devi (Blood Glucose Monitoring Suppl) .... One Meter 2)  Aspirin 81 Mg  Tbec (Aspirin) .... One By Mouth Every Day 3)  Cvs Insulin Syringe 29g X 1/2" 1 Ml Misc (Insulin Syringe-Needle U-100) .... Quantity For Once Daily Dosing - 1 Month Supply 4)  Prodigy Twist Top Lancets 28g  Misc (Lancets) .... Use As Directed 5)  Prodigy Blood Glucose Test  Strp (Glucose Blood) .... Use As Directed 6)  Lisinopril-Hydrochlorothiazide 20-12.5 Mg Tabs (Lisinopril-Hydrochlorothiazide) .... Take One Daily 7)  Lantus 100 Unit/ml Soln (Insulin Glargine) .... Inject 30 Units Twice Daily. Dispense One Month Supply 8)  Novolog 100 Unit/ml Soln (Insulin Aspart) .... 20 Units Twice Daily Prior To Meals. Dispense One Month Supply.  Allergies (verified): No Known Drug Allergies   Impression & Recommendations:  Problem # 1:  DIABETES MELLITUS II, UNCOMPLICATED (ICD-250.00) Assessment Improved Poorly controlled despite escalating dose of basal insulin NOW improved control with use of meal time insulin twice daily.   Patient comfortable with plan to increase Novolog to 20 units  twice daiy.  He will continue on Lantus 30 units twice daily prior to meals.  F/U in about 2 weeks in Rx Clinic then to New Primary MD.  TTFFC 20 minutes.  Seeen today with medical students; Glenetta Borg & Fortune Egbulefu and Eda Keys Pharmacy Resident and Dierdre Highman, PharmD Candidate.  His updated medication list for this problem includes:    Aspirin 81 Mg Tbec (Aspirin) ..... One by mouth every day    Lisinopril-hydrochlorothiazide 20-12.5 Mg Tabs (Lisinopril-hydrochlorothiazide) .Marland Kitchen... Take one daily    Lantus 100 Unit/ml Soln (Insulin glargine) ..... Inject 30 units twice daily. dispense one month supply    Novolog 100 Unit/ml Soln (Insulin aspart) .Marland Kitchen... 20 units twice daily prior to meals. dispense one month supply.  Problem # 2:  OBESITY, UNSPECIFIED (ICD-278.00) Assessment: Unchanged Patient asked to walk/jog more days per week and for shorter times.  He will consider.   Complete Medication List: 1)  Prodigy Autocode Blood Glucose Devi (Blood glucose monitoring suppl) .... One meter 2)  Aspirin 81 Mg Tbec (Aspirin) .... One by mouth every day 3)  Cvs Insulin Syringe 29g X 1/2" 1 Ml Misc (Insulin syringe-needle u-100) .... Quantity for once daily dosing - 1 month supply 4)  Prodigy Twist Top Lancets 28g Misc (Lancets) .... Use as directed 5)  Prodigy Blood Glucose Test Strp (Glucose blood) .... Use as directed 6)  Lisinopril-hydrochlorothiazide 20-12.5 Mg Tabs (Lisinopril-hydrochlorothiazide) .Marland KitchenMarland KitchenMarland Kitchen  Take one daily 7)  Lantus 100 Unit/ml Soln (Insulin glargine) .... Inject 30 units twice daily. dispense one month supply 8)  Novolog 100 Unit/ml Soln (Insulin aspart) .... 20 units twice daily prior to meals. dispense one month supply.  Patient Instructions: 1)  Increase your Novolog to twenty units 2)  Continue Lantus 30 units twice daily.  3)  Exercise more if you feel better.  4)  Come back in two weeks to Rx Clinic 2 weeks  5)  AND Visit with Dr. Lelon Perla in 4-5 weeks.   Prescriptions: NOVOLOG 100 UNIT/ML SOLN (INSULIN ASPART) 20 units twice daily prior to meals. Dispense one month supply.  #1 x 1   Entered and Authorized by:   Christian Mate D   Signed by:   Madelon Lips Pharm D on 10/08/2008   Method used:   Historical   RxID:   [id number]    Prevention & Chronic Care Immunizations   Influenza vaccine: none available  (05/19/2008)   Influenza vaccine due: 05/19/2009    Tetanus booster: 10/03/2004: Done.   Tetanus booster due: 10/04/2014    Pneumococcal vaccine: Not documented  Colorectal Screening   Hemoccult: normal  (07/08/2007)   Hemoccult due: 07/07/2008    Colonoscopy: Diverticula. Medium hemorrhoids. Done at [facility] by Dr Elnoria Howard.  (08/28/2007)   Colonoscopy action/deferral: Repeat colonoscopy in 10 years.    (08/28/2007)   Colonoscopy due: 09/2017  Other Screening   PSA: 0.37  (07/08/2007)   PSA due due: 07/07/2008    Smoking status: never  (06/07/2008)  Diabetes Mellitus   HgbA1C: >14.0%  (09/30/2008)   Hemoglobin A1C due: 08/13/2008    Eye exam: rare microaneurysm bilaterally  (06/20/2007)   Eye exam due: 06/19/2008    Foot exam: yes  (08/11/2007)   High risk foot: Not documented   Foot care education: completed  (05/19/2008)   Foot exam due: 08/10/2008    Urine microalbumin/creatinine ratio: Not documented   Urine microalbumin/cr due: Not Indicated    Diabetes flowsheet reviewed?: Yes   Progress toward A1C goal: Improved  Hypertension   Last Blood Pressure: 147 / 100  (10/08/2008)   Serum creatinine: 1.26  (04/22/2008)   Serum potassium 4.0  (07/08/2008)  Lipids   Total Cholesterol: 161  (07/08/2007)   LDL: 88  (07/08/2007)   LDL Direct: 100  (04/07/2007)   HDL: 43  (07/08/2007)   Triglycerides: 150  (07/08/2007)  Self-Management Support :   Personal Goals (by the next clinic visit) :     Personal A1C goal: 8  (10/08/2008)     Personal blood pressure goal: 130/80  (10/08/2008)      Personal LDL goal: 70  (10/08/2008)    Patient will work on the following items until the next clinic visit to reach self-care goals:     Medications and monitoring: check my blood sugar  (10/08/2008)     Eating: drink diet soda or water instead of juice or soda  (10/08/2008)     Activity: take a 30 minute walk every day  (10/08/2008)    Diabetes self-management support: CBG self-monitoring log  (10/08/2008)   Last diabetes self-management training by diabetes educator: completed    Hypertension self-management support: Not documented    Hypertension self-management support not done because: Good outcomes  (10/08/2008)   Appended Document: Orders Update    Clinical Lists Changes  Orders: Added new Test order of Reassessment Each 15 min unit- [facility] [phone]) - Signed

## 2010-04-04 NOTE — Letter (Signed)
Summary: Generic Letter  [facility]  [address]   [place], [place] [address]   Phone: [phone]  Fax: [phone]    10/08/2007 MRN: [id number]  [address] [place], [place]  [address]  Dear Mr. SCHNYDER,   Your kidney function came back to normal!   Call me with any questions.   Sincerely,   Lupita Raider MD [facility]  Appended Document: Generic Letter sent

## 2010-04-04 NOTE — Assessment & Plan Note (Signed)
Summary: FU/KH   Vital Signs:  Patient Profile:   55 Years Old Male Weight:      272.0 pounds Temp:     97.9 degrees F oral Pulse rate:   81 / minute BP sitting:   135 / 92  (right arm)  Pt. in pain?   no  Vitals Entered By: Garen Grams LPN (June 08, 1608 10:48 AM)                  Chief Complaint:  F/U VISIT.  History of Present Illness: HTN- Using medications daily and is without side effects.  Denies any cp, sob, ha, vision changes.    DM- Denies any hypoglyemic sx, polyuria/polydypsia, foot numbness/tingling.  Taking medicines daily.  Has not heard about eye exam (referral supposed to have been made last visit.  Obesity- Wt. up 8 pounds from last visit. Watching diet, but has not been exercising during the past month. Likes to jog 3-4 x per week, and plans to start doing this again soon.     Prior Medications Reviewed Using: Medication Bottles  Current Allergies: No known allergies    Social History:    Reviewed history from 03/07/2007 and no changes required:       Pt lives alone, Former 12 pack year smoker, former heavy drinker, no illicts; On disability--but pt won't elaborate. exercises 1hour daily with weight lighting and jogging.      Physical Exam  General:     Well-developed,well-nourished,in no acute distress; alert,appropriate and cooperative throughout examination Neck:     no carotid bruits Lungs:     Normal respiratory effort, chest expands symmetrically. Lungs are clear to auscultation, no crackles or wheezes. Heart:     Normal rate and regular rhythm. S1 and S2 normal without gallop, murmur, click, rub or other extra sounds. Abdomen:     Bowel sounds positive,abdomen soft and non-tender without masses, organomegaly or hernias noted. obese    Impression & Recommendations:  Problem # 1:  HYPERTENSION, BENIGN SYSTEMIC (ICD-401.1) Assessment: Deteriorated Above goal. Increase enalapril to 10 mg.  His updated medication list for this  problem includes:    Amlodipine Besylate 10 Mg Tabs (Amlodipine besylate) ..... One tablet daily    Enalapril Maleate 10 Mg Tabs (Enalapril maleate) .Marland Kitchen... Take 1 tab by mouth daily  Orders: [facility]- Est  Level 4 ([address])   Problem # 2:  DIABETES MELLITUS II, UNCOMPLICATED (ICD-250.00) Assessment: Unchanged Doing well taking medication. Check A1c next month.  Will make referral for eye exam.  LDL of 100. Will repeat at next visit. His updated medication list for this problem includes:    Metformin Hcl 1000 Mg Tabs (Metformin hcl) .Marland Kitchen... Take 1 tablet by mouth two times a day    Enalapril Maleate 10 Mg Tabs (Enalapril maleate) .Marland Kitchen... Take 1 tab by mouth daily    Glipizide Xl 10 Mg Tb24 (Glipizide) .Marland Kitchen... 1 tablet by mouth daily  Orders: [facility]- Est  Level 4 (40981)   Problem # 3:  OBESITY, UNSPECIFIED (ICD-278.00) Assessment: Deteriorated Wt. up 8 pounds, due to not exercising. Advised to increase exercise frequency. Orders: [facility]- Est  Level 4 ([address]147)   Complete Medication List: 1)  Onetouch Ultra Test Strp (Glucose blood) .... Test once daily before breakfast.  may substitute brands if necessary. 2)  Metformin Hcl 1000 Mg Tabs (Metformin hcl) .... Take 1 tablet by mouth two times a day 3)  Amlodipine Besylate 10 Mg Tabs (Amlodipine besylate) .... One tablet daily 4)  Enalapril Maleate 10 Mg Tabs (Enalapril maleate) .... Take 1 tab by mouth daily 5)  Glipizide Xl 10 Mg Tb24 (Glipizide) .Marland Kitchen.. 1 tablet by mouth daily   Patient Instructions: 1)  increase enalapril from 5 mg daily to 10 mg daily 2)  let me know if any problems getting test strips. 3)  Get your eye exam. Our office will call you with appointment.  Give [place] a call if you don't hear from [place] by the end of the week. 4)  follow up with dr. Seleta Rhymes or dr. Clelia Croft in 1 month.    Prescriptions: ONETOUCH ULTRA TEST  STRP (GLUCOSE BLOOD) test once daily before breakfast.  May substitute brands if necessary.  #100 x 2   Entered and  Authorized by:   Benn Moulder MD   Signed by:   Benn Moulder MD on 06/09/2007   Method used:   Electronically sent to ...       [facility] [address] [phone]*       [address]       [place], [place]  [address]       Ph: [phone] or [phone]       Fax: [phone]   RxID:   [id number] ENALAPRIL MALEATE 10 MG  TABS (ENALAPRIL MALEATE) Take 1 tab by mouth daily  #30 x 3   Entered and Authorized by:   Benn Moulder MD   Signed by:   Benn Moulder MD on 06/09/2007   Method used:   Electronically sent to ...       [facility] [address] [phone]*       [address]       [place], [place]  [address]       Ph: [phone] or [phone]       Fax: [phone]   RxID:   [id number]  ]

## 2010-04-04 NOTE — Assessment & Plan Note (Signed)
Summary: fu wp   Vital Signs:  Patient Profile:   55 Years Old Male Weight:      246 pounds (111.82 kg) Temp:     98.1 degrees F (36.72 degrees C) Pulse rate:   67 / minute BP sitting:   115 / 79  (left arm)  Pt. in pain?   no  Vitals Entered By: Tomasa Rand (February 05, 2007 1:38 PM)              Is Patient Diabetic? Yes      Chief Complaint:  Dm f/u.  History of Present Illness: 55yr old AAM with ?MR with DM and HTN and significant difficulties with continuing to take his medications.  He has stopped his metformin any time his A1C begins to correct due to feeling that his diabetes has gotten better.  He doesn't seem to understand that the improvement was DUE to the medication.  He also continually stops lisinopril/HCTZ because he feels weak when he starts to take it. He has never been on it for longer than a week due to this feeling.  Dr Erenest Rasher had discussed with him a numerous visits the importance of allowing his body time to re-adjust to the relative hypotension but he doesn't seem to understand this either.  He is here today alone.  Last visit we restarted the Metformin 500mg  two times a day and the lisinopril/HCTZ.  He reports no problems with the Metformin though he feels his sugar worsens when he takes it (running in the mid 200s in the mroning and evening).  He started taking the ACE inhib every other day and stopped it altogether 2-3 days ago due to "feeling pains all over."    He denies dizziness or syncope, CP, SOB, DOE, or peripheral edema.  He denies HA or blurry vision and reports he gets annual eye exams, the last in january. Doesn't report a h/o diabetic retinopathy.   Last LDL was 78  in November 2008.  Today, the room smells strongly of bleach and he reports cleaning his hands with it "just because they were dirty" and reports doing this often but won't elaborate much on why.    Current Allergies (reviewed today): No known allergies   Past Medical  History:    - T2DM    - HTN    - ?MR    - h/o elevated LFTs in Feb 2008 with neg hep panel and neg abd [place] except for fatty liver - FU LFTS 2008 normal   Social History:    Reviewed history from 05/02/2006 and no changes required:       Pt lives alone, Former 12 pack year smoker, former heavy drinker, no illicts; On disability--but pt won't elaborate     Physical Exam  General:     Well-developed,well-nourished,in no acute distress; alert,appropriate and cooperative throughout examination Mouth:     MMM Lungs:     Normal respiratory effort, chest expands symmetrically. Lungs are clear to auscultation, no crackles or wheezes. Heart:     Normal rate and regular rhythm. S1 and S2 normal without gallop, murmur, click, rub or other extra sounds. Msk:     Bilateral dry skin on hands that smell strongly of bleach. Multiple thickened callous on hands Pulses:     2+ radial pulses bilaterally Extremities:     no edema Neurologic:     gait normal.   Psych:     normally interactive and good eye contact.  Has some odd  facial movements and almost mouths words to himself during our interview.     Impression & Recommendations:  Problem # 1:  HYPERTENSION, BENIGN SYSTEMIC (ICD-401.1) Assessment: Improved Pt refuses to take the ACE inhibitor anymore secondary to body aches.  Discussed the importance of this medication with kidney problems but he is quite resistent. Discussed starting an ARB but he is also resistent. Will start Ca channel blocker at low dose.  FU one month.  The following medications were removed from the medication list:    Zestoretic 20-25 Mg Tabs (Lisinopril-hydrochlorothiazide) .Marland Kitchen... Take 1 tablet by mouth once a day     His updated medication list for this problem includes:    Amlodipine Besylate 5 Mg Tabs (Amlodipine besylate) ..... One tablet daily  The following medications were removed from the medication list:    Zestoretic 20-25 Mg Tabs  (Lisinopril-hydrochlorothiazide) .Marland Kitchen... Take 1 tablet by mouth once a day    Zestoretic 20-25 Mg Tabs (Lisinopril-hydrochlorothiazide) ..... One daily  His updated medication list for this problem includes:    Amlodipine Besylate 5 Mg Tabs (Amlodipine besylate) ..... One tablet daily  Orders: [facility]- Est  Level 4 ([address])   Problem # 2:  DIABETES MELLITUS II, UNCOMPLICATED (ICD-250.00) Assessment: Unchanged Increase Metformin to 1g by mouth two times a day. ?if pt is actually taking but he reports that he is.   The following medications were removed from the medication list:    Zestoretic 20-25 Mg Tabs (Lisinopril-hydrochlorothiazide) .Marland Kitchen... Take 1 tablet by mouth once a day  His updated medication list for this problem includes:    Metformin Hcl 500 Mg Tabs (Metformin hcl) .Marland Kitchen..Marland Kitchen Two tablets by mouth two times a day  The following medications were removed from the medication list:    Zestoretic 20-25 Mg Tabs (Lisinopril-hydrochlorothiazide) .Marland Kitchen... Take 1 tablet by mouth once a day    Zestoretic 20-25 Mg Tabs (Lisinopril-hydrochlorothiazide) ..... One daily  His updated medication list for this problem includes:    Metformin Hcl 500 Mg Tabs (Metformin hcl) .Marland Kitchen..Marland Kitchen Two tablets by mouth two times a day  Orders: [facility]- Est  Level 4 ([address])   Problem # 3:  MENTAL RETARDATION (ICD-319) Assessment: Unchanged Concern he is not able to truly manage his medical issues but he is very resistant to talking about his abilities to function and always comes alone. I have encouraged he bring a family member or someone he trusts to our visits to help him.  ?OCD type tendencies by the washing of hands in bleach. Advised this is not needed and can be dangerous to his skin. Regular antibacterial soap will do. Orders: [facility]- Est  Level 4 ([address]214)   Problem # 4:  TRANSAMINASES, SERUM, ELEVATED (ICD-790.4) Assessment: Improved Resolved. Unclear etiology.  Orders: [facility]- Est  Level 4 ([address])     Complete  Medication List: 1)  Onetouch Ultra Test Strp (Glucose blood) .... Use as directed 2)  Metformin Hcl 500 Mg Tabs (Metformin hcl) .... Two tablets by mouth two times a day 3)  Amlodipine Besylate 5 Mg Tabs (Amlodipine besylate) .... One tablet daily   Patient Instructions: 1)  Please schedule a follow-up appointment in 1 month to check on your blood pressure and your sugar.  2)  For your blood pressure: STOP taking the lisinopril/HTCZ medicine and START taking Amlodipine 5mg  daily.  3)  For your diabetes: INCREASE the Metformin to 2 tablets twice a day.  4)  I hope you have a wonderful holiday season!!    Prescriptions:  AMLODIPINE BESYLATE 5 MG  TABS (AMLODIPINE BESYLATE) one tablet daily  #30 x 6   Entered and Authorized by:   Lupita Raider MD   Signed by:   Lupita Raider MD on 02/05/2007   Method used:   Electronically sent to ...       [facility] [address] [phone]*       [address]       [place], [place]  [address]       Ph: [phone] or [phone]       Fax: [phone]   RxID:   [id number] METFORMIN HCL 500 MG  TABS (METFORMIN HCL) two tablets by mouth two times a day  #130 x 6   Entered and Authorized by:   Lupita Raider MD   Signed by:   Lupita Raider MD on 02/05/2007   Method used:   Electronically sent to ...       [facility] [address] [phone]*       [address]       [place], [place]  [address]       Ph: [phone] or [phone]       Fax: [phone]   RxID:   [id number]  ]

## 2010-04-04 NOTE — Assessment & Plan Note (Signed)
Summary: F/U SUGARS   DPG   Vital Signs:  Patient Profile:   55 Years Old Male Weight:      250.8 pounds Temp:     98.2 degrees F oral Pulse rate:   82 / minute BP sitting:   112 / 72  (left arm) Cuff size:   large  Vitals Entered By: Garen Grams LPN (October 29, 2007 2:49 PM)                Last LDL:  88 (07/08/2007 8:36:00 PM) LDL Next Due:  1 yr Flex Sig Next Due:  Not Indicated   Chief Complaint:  f/u visit on DM, HTN, and obesity.  History of Present Illness: 55yr old AAM presents to discuss the following  1) DM - [name] has had an arduous course with his DM. He went from 8 to 11 after stopping his metformin because he thought he was "cured" but had gotten it back down to 8% in May of this year by taking his meds and cutting out sweets. Was on Metformin and Glipizide until this spring when his glipizide was doubled and he was started on Januvia 07/08/07. Today when his A1C returned at >14%, he admits to not taking the metformin and januvia regularly due to "makes me feel bad."  He really can't describe it more than "back pain."  He brings his meter today and his CBGs are running 300-500s. He admits he overeats and loves cookies and ice cream and bread. He hates veggies and can't bring himself to eat sugar free. He reports taking the glipizide daily without side effect. He does endorse polyuria and polydipsia and fatigue that he understands is likely related to his CBGs. Eye exam UTD in April 2009. Diabetic foot exam normal June 2009. Microalbumin was 2+ in Nov 2008.   2) HTN - On last vsit, enalapril dose of 20mg  was halved due to pt's report of it causing diarrhea. He reports no side effect on the enalapril. Still on norvasc. No CP, SOB, orthopnea, peripheral edeam.  3) Obesity - Pt has lost 20lbs in the past few months by increasing exercise. Unfortunately, I'm afraid it is more related to uncontrolled DM.     Current Allergies (reviewed today): No known allergies   Past  Medical History:    - T2DM    - HTN    - ?MR vs schizophrenia - nothing has been documented but pt often reacts to something in the room that I don't appreciate and refers to himself as "we" a lot. odd behaviors like making strange noises and washing hands in bleach    - h/o elevated LFTs in Feb 2008 with neg hep panel and neg abd [place] except for fatty liver - FU LFTS 2008 normal   Social History:    Pt lives alone, Former 12 pack year smoker, former heavy drinker, no illicts; On disability--but pt won't elaborate. Has odd affect and ?MR vs schizoaffective d/o. Exercises 1hour daily with weight lighting and jogging. Washes his hands with bleach.      Physical Exam  General:     obese AAM,in no acute distress; alert,appropriate and cooperative throughout examination. has odd movements and facial expressions.  Eyes:     clear sclerae Mouth:     poor dentition, MMM Extremities:     no edema.  Psych:     normally interactive and good eye contact.  Has some odd facial movements and almost mouths words to himself during our interview.  Impression & Recommendations:  Problem # 1:  DIABETES MELLITUS II, UNCOMPLICATED (ICD-250.00) Assessment: Deteriorated [name] is frustrating because I think he has some undiagnosed psych issues that are hindering our ability to get his CBGs under control.  He constantly comes up with "side effects" to meds and always follows up our interviews with "are all the medicines necessary?"  I have been working with [name] for over a year now and he is very compliant with some aspects of our care like increasing his exercise and picking up his medicines.  He just simply can't follow a diabetic diet nor can he take his meds.  I discussed with him that he is very likely going to need to start on insulin and he is and always has been very resistent to this. I'm also concerned about starting insulin in someone with an undiagnosed psych d/o vs compromised mental capacity. He  agreed to meet with Dr Raymondo Band in the coming week to discuss options at this point.  He seems to do well with male figures that are straight forward with him, as he responded well to Dr Seleta Rhymes while I was out on maternity leave.   His updated medication list for this problem includes:    Metformin Hcl 1000 Mg Tabs (Metformin hcl) .Marland Kitchen... Take 1 tablet by mouth two times a day    Enalapril Maleate 20 Mg Tabs (Enalapril maleate) .Marland Kitchen... 1/2 tablet daily    Glipizide Xl 10 Mg Tb24 (Glipizide) .Marland Kitchen... 2 tablet by mouth daily    Aspirin 81 Mg Tbec (Aspirin) ..... One by mouth every day    Januvia 100 Mg Tabs (Sitagliptin phosphate) .Marland Kitchen... Take 1 tab by mouth daily  Orders: A1C-[facility] (81191) [facility]- Est  Level 4 (478[address])   Problem # 2:  HYPERTENSION, BENIGN SYSTEMIC (ICD-401.1) Assessment: Improved Continue current meds.   His updated medication list for this problem includes:    Amlodipine Besylate 10 Mg Tabs (Amlodipine besylate) ..... One tablet daily    Enalapril Maleate 20 Mg Tabs (Enalapril maleate) .Marland Kitchen... 1/2 tablet daily  Orders: [facility]- Est  Level 4 (56[address])   Problem # 3:  OBESITY, UNSPECIFIED (ICD-278.00) Assessment: Improved 20lb weight loss although I'm worried it's more from uncontrolled diabetes.  Orders: [facility]- Est  Level 4 (08[address])   Complete Medication List: 1)  Onetouch Ultra Test Strp (Glucose blood) .... Test once daily before breakfast.  may substitute brands if necessary. 2)  Metformin Hcl 1000 Mg Tabs (Metformin hcl) .... Take 1 tablet by mouth two times a day 3)  Amlodipine Besylate 10 Mg Tabs (Amlodipine besylate) .... One tablet daily 4)  Enalapril Maleate 20 Mg Tabs (Enalapril maleate) .... 1/2 tablet daily 5)  Glipizide Xl 10 Mg Tb24 (Glipizide) .... 2 tablet by mouth daily 6)  Aspirin 81 Mg Tbec (Aspirin) .... One by mouth every day 7)  Triamcinolone Acetonide 0.1 % Crea (Triamcinolone acetonide) .... Apply bid to affected area  dispense 30 gram tube 8)  Januvia 100 Mg Tabs  (Sitagliptin phosphate) .... Take 1 tab by mouth daily   Patient Instructions: 1)  Your blood pressure is great! But your sugar is not. Please schedule an appointment with Dr Raymondo Band in his diabetic clinic in the next week or two to talk about options for medications.  2)  I'll see you back after that appointment.    ]  Appended Document: A1c report    Lab Visit   Laboratory Results   Blood Tests   Date/Time Received: October 29, 2007  2:58  PM  Date/Time Reported: October 29, 2007 4:02 PM   HGBA1C: >14.0 %   (Normal Range: Non-Diabetic - 3-6%   Control Diabetic - 6-8%)  Comments: ...............test performed by......Marland KitchenBonnie A. [place], [place] (ASCP)     Orders Today:

## 2010-04-04 NOTE — Miscellaneous (Signed)
Summary: chart update  Clinical Lists Changes  Problems: Removed problem of CALLUS, RIGHT FOOT (ICD-700) Removed problem of SPECIAL SCREENING FOR MALIGNANT NEOPLASMS COLON (ICD-V76.51) Removed problem of SKIN RASH (ICD-782.1) Removed problem of TRANSAMINASES, SERUM, ELEVATED (ICD-790.4) Removed problem of SCREENING FOR LIPOID DISORDERS (ICD-V77.91)

## 2010-04-04 NOTE — Assessment & Plan Note (Signed)
Summary: Diabetes / HTN - Lantus F/U  Rx Clinic   Vital Signs:  Patient Profile:   55 Years Old Male Height:     73.5 inches Weight:      261 pounds BMI:     34.09 Pulse rate:   72 / minute BP sitting:   130 / 77  (left arm)                 Diabetes Management History:      The patient is a 55 years old male who comes in for evaluation of DM Type 2.  He states that he is not following the diet appropriately.  He is checking home blood sugars.  He says that he is exercising.  Type of exercise includes: running.  Duration of exercise is estimated to be 60 min.  He is doing this 3 times per week.        His home fasting blood sugars are as follows: highest: 202; lowest: 79; average: 170.  His 4:00 PM blood sugars reveal: highest: 234; lowest: 55.    Average 14 day - 197    Current Allergies (reviewed today): No known allergies         Impression & Recommendations:  Problem # 1:  DIABETES MELLITUS II, UNCOMPLICATED (ICD-250.00) Assessment: Improved Diabetes of: 3  yrs duration currently under: fair control of blood glucose based on  fasting CBGs < 200 routinely.  Control is suboptimal due EA:[id number] indeiscretion. reports one hypoglycemic event while not eating for a day due to lack of food at the end the month.  Able to verbalize appropriate hypoglycemia management plan. Continued basal insulin Lantus30 units.   Provided Sample of lantus as patient will not pick up meds until next month and may run out of insulin. Written pt instructions provided:   F/U Rx Clinic Visit:  3 weeks.  TTFFC:  45 mins.  Pt seen with: Delia Heady, Pharm D Candidate and [facility] Resident.  The following medications were removed from the medication list:    Januvia 100 Mg Tabs (Sitagliptin phosphate) .Marland Kitchen... Take 1 tab by mouth daily  His updated medication list for this problem includes:    Metformin Hcl 1000 Mg Tabs (Metformin hcl) .Marland Kitchen... Take 1 tablet by mouth two times a day  Enalapril Maleate 20 Mg Tabs (Enalapril maleate) .Marland Kitchen... 1 tablet daily    Glipizide Xl 10 Mg Tb24 (Glipizide) .Marland Kitchen... 2 tablet by mouth daily    Aspirin 81 Mg Tbec (Aspirin) ..... One by mouth every day    Lantus 100 Unit/ml Soln (Insulin glargine) .Marland KitchenMarland KitchenMarland KitchenMarland Kitchen 30 units once daily in the morning  Orders: Reassessment Each 15 min unit- [facility] (91478)   Problem # 2:  HYPERTENSION, BENIGN SYSTEMIC (ICD-401.1) Assessment: Unchanged Blood pressure at goal with two drug regimen.  Clarified doses of maintenance meds and provided refills.  His updated medication list for this problem includes:    Amlodipine Besylate 10 Mg Tabs (Amlodipine besylate) ..... One tablet daily    Enalapril Maleate 20 Mg Tabs (Enalapril maleate) .Marland Kitchen... 1 tablet daily  Orders: Reassessment Each 15 min unit- [facility] ([address])   Complete Medication List: 1)  Onetouch Ultra Test Strp (Glucose blood) .... Test once daily before breakfast.  may substitute brands if necessary. 2)  Metformin Hcl 1000 Mg Tabs (Metformin hcl) .... Take 1 tablet by mouth two times a day 3)  Amlodipine Besylate 10 Mg Tabs (Amlodipine besylate) .... One tablet daily 4)  Enalapril Maleate 20 Mg  Tabs (Enalapril maleate) .Marland Kitchen.. 1 tablet daily 5)  Glipizide Xl 10 Mg Tb24 (Glipizide) .... 2 tablet by mouth daily 6)  Aspirin 81 Mg Tbec (Aspirin) .... One by mouth every day 7)  Lantus 100 Unit/ml Soln (Insulin glargine) .... 30 units once daily in the morning 8)  [facility] Insulin Syringe 29g X 1/2" 1 Ml Misc (Insulin syringe-needle u-100) .... Quantity for once daily dosing - 1 month supply  Diabetes Management Assessment/Plan:      The following lipid goals have been established for the patient: Total cholesterol goal of 200; LDL cholesterol goal of 100; HDL cholesterol goal of 40; Triglyceride goal of 200.  His blood pressure goal is < 130/80.     Patient Instructions: 1)  Please schedule a follow-up appointment in 3 weeks in Rx clinic. 2)  Start taking one baby aspirin  daily. 3)  Continue taking lantus 30 units each morning.  4)  Please try to be good with your diet and your exercise.  5)  We stopped the Januvia today.   Prescriptions: LANTUS 100 UNIT/ML SOLN (INSULIN GLARGINE) 30 units once daily in the morning  #1 x 0   Entered and Authorized by:   Madelon Lips PHARMD   Signed by:   Madelon Lips PHARMD on 12/08/2007   Method used:   Historical   RxID:   [id number] ENALAPRIL MALEATE 20 MG  TABS (ENALAPRIL MALEATE) 1 tablet daily  #30 x 6   Entered and Authorized by:   Madelon Lips PHARMD   Signed by:   Madelon Lips PHARMD on 12/08/2007   Method used:   Historical   RxID:   [id number]  ]

## 2010-04-04 NOTE — Assessment & Plan Note (Signed)
Summary: MEET NEW DR/KH   Vital Signs:  Patient profile:   55 year old male Height:      71 inches Weight:      278.3 pounds BMI:     38.96 Temp:     98.3 degrees F oral Pulse rate:   86 / minute BP sitting:   174 / 108  (right arm) Cuff size:   large  Vitals Entered By: Garen Grams LPN (December 01, 2008 11:05 AM) CC: HTN, DM Is Patient Diabetic? Yes  Pain Assessment Patient in pain? no        Serial Vital Signs/Assessments:  Time      Position  BP       Pulse  Resp  Temp     By                     170/98                         Angelena Sole MD  Comments: 11:06 AM Manual BP - 170/98 By: Garen Grams LPN    CC:  HTN and DM.  History of Present Illness: 1. HTN:  Pt. is not taking his Lisinopril-HCTZ because he thinks that it makes him feel tired and sluggish.  He has not taken it for a while.  He thinks that it does work when he does take it.  He doesn't check his blood pressure regularly but when he does it is usually high since he doesn't take his BP medications.  He doesn't exercise and isn't watching his salt intake.   ROS:  He denies any chest pain, shortness of breath, headache  2. DMII:  Pt. is not taking his insulin as prescribed everyday.  He usually misses one of the doses (i.e. he missed yesterday's morning Lantus dose).   He is checking his blood sugars regularly.  He checks a fasting in the morning and checks it in the evening before supper.  His CBGs have been high, he thinks around 280 but he didn't bring in any recorded values.    3. Obesity:  Pt. does not exercise and isn't watching his diet.  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 One Meter 2)  Aspirin 81 Mg  Tbec (Aspirin) .... One By Mouth Every Day 3)  Cvs Insulin Syringe 29g X 1/2" 1 Ml Misc (Insulin Syringe-Needle U-100) .... Quantity For Once Daily Dosing - 1 Month Supply 4)  Prodigy Twist Top Lancets 28g  Misc (Lancets) .... Use As Directed 5)  Prodigy Blood Glucose Test  Strp (Glucose Blood) .... Use As Directed 6)  Lantus 100 Unit/ml Soln (Insulin Glargine) .... Inject 30 Units Twice Daily. Dispense One Month Supply 7)  Novolog 100 Unit/ml Soln (Insulin Aspart) .... 30 Units Twice Daily Prior To Meals. Dispense One Month Supply. 8)  Multivitamins  Tabs (Multiple Vitamin) .... One Daily 9)  Enalapril-Hydrochlorothiazide 5-12.5 Mg Tabs (Enalapril-Hydrochlorothiazide) .... Take 1 Tab By Mouth Daily  Allergies: No Known Drug Allergies  Past History:  Past Medical History: Reviewed history from 10/29/2007 and no changes required. - T2DM -  HTN - ?MR vs schizophrenia - nothing has been documented but pt often reacts to something in the room that I don't appreciate and refers to himself as "we" a lot. odd behaviors like making strange noises and washing hands in bleach - h/o elevated LFTs in Feb 2008 with neg hep panel and neg abd [place] except for fatty liver - FU LFTS 2008 normal  Social History: Reviewed history from 10/29/2007 and no changes required. Pt lives alone, Former 12 pack year smoker, former heavy drinker, no illicts; On disability--but pt won't elaborate. Has odd affect and ?MR vs schizoaffective d/o. Doesn't exercise.  Denies tobacco, alcohol, or drug use  Physical Exam  General:  obese AAM,in no acute distress; alert,appropriate and cooperative throughout examination. has odd movements and facial expressions.  Head:  Normocephalic and atraumatic without obvious abnormalities. Eyes:  clear sclerae Ears:  no external deformities.   Nose:  no external deformity and no nasal  discharge.   Mouth:  poor dentition, MMM Neck:  no carotid bruits Lungs:  Normal respiratory effort, chest expands symmetrically. Lungs are clear to auscultation, no crackles or wheezes. Heart:  Normal rate and regular rhythm. S1 and S2 normal without gallop, murmur, click, rub or other extra sounds. Abdomen:  Bowel sounds positive,abdomen soft and non-tender without masses, organomegaly or hernias noted. obese Pulses:  2+ dp and pt pulses Extremities:  no edema.  Neurologic:  alert & oriented X3 and gait normal.   Psych:  normally interactive and good eye contact.  Has some odd facial movements and expressions   Impression & Recommendations:  Problem # 1:  DIABETES MELLITUS II, UNCOMPLICATED (ICD-250.00) Assessment Deteriorated Refer back to pharmacy clinic to help with management.  Likely will need increase of Lantus. The following medications were removed from the medication list:    Lisinopril-hydrochlorothiazide 20-12.5 Mg Tabs (Lisinopril-hydrochlorothiazide) .Marland Kitchen... Take one daily His updated medication list for this problem includes:    Aspirin 81 Mg Tbec (Aspirin) ..... One by mouth every day    Lantus 100 Unit/ml Soln (Insulin glargine) ..... Inject 30 units twice daily. dispense one month supply    Novolog 100 Unit/ml Soln (Insulin aspart) .Marland KitchenMarland KitchenMarland KitchenMarland Kitchen 30 units twice daily prior to meals. dispense one month supply.    Enalapril-hydrochlorothiazide 5-12.5 Mg Tabs (Enalapril-hydrochlorothiazide) .Marland Kitchen... Take 1 tab by mouth daily  Orders: Ophthalmology Referral (Ophthalmology) T-Lipid Profile [phone]) [facility]- Est  Level 4 (09811)  Problem # 2:  HYPERTENSION, BENIGN SYSTEMIC (ICD-401.1) Assessment: Deteriorated  Switched from Lisinopril to Enalapril and will have him follow up in 4 weeks to see how he tolerates. The following medications were removed from the medication list:    Lisinopril-hydrochlorothiazide 20-12.5 Mg Tabs (Lisinopril-hydrochlorothiazide) .Marland Kitchen... Take one daily His  updated medication list for this problem includes:    Enalapril-hydrochlorothiazide 5-12.5 Mg Tabs (Enalapril-hydrochlorothiazide) .Marland Kitchen... Take 1 tab by mouth daily  Orders: [facility]- Est  Level 4 (91478)  Problem # 3:  OBESITY, UNSPECIFIED (ICD-278.00) Assessment: Unchanged  Needs work on diet and exercise.  Orders: [facility] [place]- Est  Level 4 ([address]562)  Complete Medication List: 1)  Prodigy Autocode Blood Glucose Devi (Blood glucose monitoring suppl) .... One meter 2)  Aspirin 81 Mg Tbec (Aspirin) .... One by mouth every day 3)  Cvs Insulin Syringe 29g X 1/2" 1 Ml Misc (Insulin syringe-needle u-100) .... Quantity for once daily dosing - 1 month supply 4)  Prodigy Twist Top Lancets 28g Misc (Lancets) .... Use as directed 5)  Prodigy Blood Glucose Test Strp (Glucose blood) .... Use  as directed 6)  Lantus 100 Unit/ml Soln (Insulin glargine) .... Inject 30 units twice daily. dispense one month supply 7)  Novolog 100 Unit/ml Soln (Insulin aspart) .... 30 units twice daily prior to meals. dispense one month supply. 8)  Multivitamins Tabs (Multiple vitamin) .... One daily 9)  Enalapril-hydrochlorothiazide 5-12.5 Mg Tabs (Enalapril-hydrochlorothiazide) .... Take 1 tab by mouth daily  Patient Instructions: 1)  Please schedule a follow up appt. with me in 4 weeks to recheck your blood pressure 2)  Please schedule an appt. with the pharmacy diabetic clinic to help with your blood sugars 3)  I am changing your blood pressure medicine from Lisinopril-HCTZ to Enalapril-HCTZ.  Take it everyday and let me know how you feel. Prescriptions: ENALAPRIL-HYDROCHLOROTHIAZIDE 5-12.5 MG TABS (ENALAPRIL-HYDROCHLOROTHIAZIDE) Take 1 tab by mouth daily  #30 x 3   Entered and Authorized by:   Angelena Sole MD   Signed by:   Angelena Sole MD on 12/01/2008   Method used:   [facility] to        [facility] [address] [phone]* (retail)       [address]       [place], [place]  [address]       Ph:  [phone] or [phone]       Fax: [phone]   RxID:   [id number]   Prevention & Chronic Care Immunizations   Influenza vaccine: none available  (05/19/2008)   Influenza vaccine due: 05/19/2009    Tetanus booster: 10/03/2004: Done.   Tetanus booster due: 10/04/2014    Pneumococcal vaccine: Not documented  Colorectal Screening   Hemoccult: normal  (07/08/2007)   Hemoccult due: 07/07/2008    Colonoscopy: Diverticula. Medium hemorrhoids. Done at [facility] by Dr Elnoria Howard.  (08/28/2007)   Colonoscopy action/deferral: Repeat colonoscopy in 10 years.    (08/28/2007)   Colonoscopy due: 09/2017  Other Screening   PSA: 0.37  (07/08/2007)   PSA due due: 07/07/2008   Smoking status: never  (12/01/2008)  Diabetes Mellitus   HgbA1C: >14.0%  (09/30/2008)   Hemoglobin A1C due: 08/13/2008    Eye exam: rare microaneurysm bilaterally  (06/20/2007)   Diabetic eye exam action/deferral: Ophthalmology referral  (12/01/2008)   Eye exam due: 06/19/2008    Foot exam: yes  (08/11/2007)   High risk foot: Not documented   Foot care education: completed  (05/19/2008)   Foot exam due: 08/10/2008    Urine microalbumin/creatinine ratio: Not documented   Urine microalbumin/cr due: Not Indicated    Diabetes flowsheet reviewed?: Yes   Progress toward A1C goal: Deteriorated  Lipids   Total Cholesterol: 161  (07/08/2007)   Lipid panel action/deferral: Lipid Panel ordered   LDL: 88  (07/08/2007)   LDL Direct: 100  (04/07/2007)   HDL: 43  (07/08/2007)   Triglycerides: 150  (07/08/2007)  Hypertension   Last Blood Pressure: 174 / 108  (12/01/2008)   Serum creatinine: 1.26  (04/22/2008)   Serum potassium 4.0  (07/08/2008)    Hypertension flowsheet reviewed?: Yes   Progress toward BP goal: Deteriorated  Self-Management Support :   Personal Goals (by the next clinic visit) :     Personal A1C goal: 8  (10/08/2008)     Personal blood pressure goal: 130/80  (10/08/2008)      Personal LDL goal: 70  (10/08/2008)    Diabetes self-management support: Written self-care plan  (10/26/2008)   Last diabetes self-management training by diabetes educator: completed  Hypertension self-management support: Written self-care plan  (10/26/2008)    Hypertension self-management support not done because: Good outcomes  (10/08/2008)   Nursing Instructions: Refer for screening diabetic eye exam (see order)

## 2010-04-04 NOTE — Assessment & Plan Note (Signed)
Summary: fu/dm/el  Medications Added ONETOUCH ULTRA TEST  STRP (GLUCOSE BLOOD)  ZESTORETIC 20-25 MG TABS (LISINOPRIL-HYDROCHLOROTHIAZIDE) Take 1 tablet by mouth once a day METFORMIN HCL 500 MG  TABS (METFORMIN HCL) one by mouth two times a day ZESTORETIC 20-25 MG  TABS (LISINOPRIL-HYDROCHLOROTHIAZIDE) one daily      Allergies Added: NKDA  Vital Signs:  Patient Profile:   55 Years Old Male Weight:      258 pounds Temp:     97.9 degrees F Pulse rate:   60 / minute BP sitting:   164 / 95  Pt. in pain?   no  Vitals Entered By: Jone Baseman CMA (January 07, 2007 2:00 PM)                  Chief Complaint:  routine visit.  History of Present Illness: 55yr old AAM with ?MR new to me but pt of Dr Erenest Rasher before who has DM and HTN and significant difficulties with continuing to take his medications.  He has stopped his metformin any time his A1C begins to correct due to feeling that his diabetes has gotten better.  He doesn't seem to understand that the improvement was DUE to the medication.  He also continually stops lisinopril/HCTZ because he feels weak when he starts to take it. He has never been on it for longer than a week due to this feeling.  Dr Erenest Rasher had discussed with him a numerous visits the importance of allowing his body time to re-adjust to the relative hypotension but he doesn't seem to understand this either.  He is here today alone.  He denies any problems or concerns. When asked, he does report polyuria/polydipsia and nocturia q 1-2 hours a night. He checks his CBGs at home twice a day and reports they are always in the 200s.  He denies dizziness or syncope now or while on the antihypertensives.  He denies CP, SOB, DOE, or peripheral edema.  He denies HA or blurry vision and reports he gets annual eye exams, the last in january. Doesn't report a h/o diabetic retinopathy.   Last LDL was 75 in 2006.   Has h/o elevated AST/ALT in Feb 2008 with neg hep panel and new  abd [place] but no further FU and no real explaination of the elevation per chart or pt.    Current Allergies: No known allergies   Past Medical History:    - T2DM    - HTN    - ?MR    - h/o elevated LFTs in Feb 2008 with neg hep panel and neg abd [place] except for fatty liver   Family History:    Dad died w/ CVA in 50s; Mom died of unknown Cancer; siblings generally healthy except 2 with unknown cancer. No MI or CVA <65 in family.   Social History:    Reviewed history from 05/02/2006 and no changes required:       Pt lives alone, Former 12 pack year smoker, former heavy drinker, no illicts; On disability--but pt won't elaborate    Review of Systems       neg except per HPI   Physical Exam  General:     Well-developed,well-nourished,in no acute distress; alert,appropriate and cooperative throughout examination. makes odd movements durign interview.  Head:     normocephalic and atraumatic.   Eyes:     perrl, eomi Mouth:     MMM Neck:     supple and full ROM.  no bruits Lungs:  Normal respiratory effort, chest expands symmetrically. Lungs are clear to auscultation, no crackles or wheezes. Heart:     Normal rate and regular rhythm. S1 and S2 normal without gallop, murmur, click, rub or other extra sounds. Pulses:     2+ radial Extremities:     no edema Neurologic:     alert & oriented X3, cranial nerves II-XII intact, and gait normal.   Skin:     turgor normal and color normal.   Psych:     Oriented X3, not anxious appearing, not depressed appearing, not agitated, and poor eye contact.  makes strange movements during interview where he appears to be quite surprised but they resolve and when addressed, he appears to not know what I'm asking him about    Impression & Recommendations:  Problem # 1:  DIABETES MELLITUS II, UNCOMPLICATED (ICD-250.00) Assessment: Deteriorated A1C 11.3%, up from 8% earlier this year.  Restart metformin.  Had long d/w pt re: diabetes in  incurable and diet/exercise can only do so much.  He clearly needs more than that right now but he is resistent to starting any medication at all, much less multiple.  Give than his A1Cs have been not insanely high in the very recent past, pt agreed to restart metformin and titrate up as needed.  Repeat microalb given last value one eyar ago 2+.  I suspect it will be higher.  He reports annual eye exams and no feet problems. Advised daily baby aspirin but pt is resistent to so many medications today. Given this, will hold on pushing ASA until I get to know him and establish a relationship.   His updated medication list for this problem includes:    Zestoretic 20-25 Mg Tabs (Lisinopril-hydrochlorothiazide) .Marland Kitchen... Take 1 tablet by mouth once a day    Metformin Hcl 500 Mg Tabs (Metformin hcl) ..... One by mouth two times a day     Orders: A1C-[facility] ([address]109) UA Microalbumin-[facility] (60454) [facility]- Est  Level 4 (09811)  His updated medication list for this problem includes:    Zestoretic 20-25 Mg Tabs (Lisinopril-hydrochlorothiazide) .Marland Kitchen... Take 1 tablet by mouth once a day    Metformin Hcl 500 Mg Tabs (Metformin hcl) ..... One by mouth two times a day    Zestoretic 20-25 Mg Tabs (Lisinopril-hydrochlorothiazide) ..... One daily   Problem # 2:  HYPERTENSION, BENIGN SYSTEMIC (ICD-401.1) Long discussion re: importance of BP control.  Restart ACE/HCTZ for one entire month.  If continues to have weakness after one month on medication when BP is at baseline, could consider changing to norvasc but given diabetes, would like to restart this medication for preventative benefits.  Check creatinine today and will need FU in one month.  His updated medication list for this problem includes:    Zestoretic 20-25 Mg Tabs (Lisinopril-hydrochlorothiazide) .Marland Kitchen... Take 1 tablet by mouth once a day  His updated medication list for this problem includes:    Zestoretic 20-25 Mg Tabs (Lisinopril-hydrochlorothiazide) .Marland Kitchen... Take 1  tablet by mouth once a day    Zestoretic 20-25 Mg Tabs (Lisinopril-hydrochlorothiazide) ..... One daily  Orders: [facility]- Est  Level 4 (99214)   Problem # 3:  SCREENING FOR LIPOID DISORDERS (ICD-V77.91) Last LDL at 75.  Due to diabetic status, need q 15yr checks.   Orders: Direct LDL-[facility] ([address]78-29562) [facility]- Est  Level 4 (13086)   Problem # 4:  TRANSAMINASES, SERUM, ELEVATED (ICD-790.4) Will recheck CMET today for possibly needing to start statin and also FU on elevated values from earlier  this year.  Orders: Comp Met-[facility] ([address]-60454) [facility]- Est  Level 4 (09811)   Problem # 5:  MENTAL RETARDATION (ICD-319) Unclear the extent of this. Pt lives alone and won't elaborate on much. Understand he will be a difficult gentleman to educate. Patience and establishing a trusting relationship will be key.  Orders: [facility]- Est  Level 4 (91478)   Complete Medication List: 1)  Onetouch Ultra Test Strp (Glucose blood) 2)  Zestoretic 20-25 Mg Tabs (Lisinopril-hydrochlorothiazide) .... Take 1 tablet by mouth once a day 3)  Metformin Hcl 500 Mg Tabs (Metformin hcl) .... One by mouth two times a day 4)  Zestoretic 20-25 Mg Tabs (Lisinopril-hydrochlorothiazide) .... One daily   Patient Instructions: 1)  Please schedule a follow-up appointment in 4-6 weeks with Dr Clelia Croft to go over how you are doing on the medicines for your diabetes and your blood pressure. 2)  Your diabetes and your blood pressure are out of control.  Cutting back on the amount of sweets and carbohydrates you eat and trying to exercise at least 3-5 times per week is a good way to help them.  It is also very important you start back on your medicines. 3)  See your eye doctor yearly to check for diabetic eye damage. 4)  Check your feet each night for sore areas, calluses or signs of infection.    Prescriptions: ZESTORETIC 20-25 MG  TABS (LISINOPRIL-HYDROCHLOROTHIAZIDE) one daily  #30 x 6   Entered and Authorized by:   Lupita Raider MD    Signed by:   Lupita Raider MD on 01/07/2007   Method used:   [facility] to ...       [facility] [address] [phone]*       [address]       [place], [place]  [address]       Ph: [phone] or [phone]       Fax: [phone]   RxID:   [id number] METFORMIN HCL 500 MG  TABS (METFORMIN HCL) one by mouth two times a day  #60 x 6   Entered and Authorized by:   Lupita Raider MD   Signed by:   Lupita Raider MD on 01/07/2007   Method used:   Electronically sent to ...       [facility] [address] [phone]*       [address]       [place], [place]  [address]       Ph: [phone] or [phone]       Fax: [phone]   RxID:   [id number]  ] Laboratory Results   Urine Tests  Date/Time Received: January 07, 2007 2:57 PM  Date/Time Reported: January 07, 2007 3:24 PM  Protein: 100   (Normal Range: Negative) Microalbumin (urine): 2+ mg/L    Comments: ...................................................................DONNA [facility]  January 07, 2007 3:24 PM   Blood Tests   Date/Time Received: January 07, 2007 2:01 PM  Date/Time Reported: January 07, 2007 2:17 PM   HGBA1C: 11.3%   (Normal Range: Non-Diabetic - 3-6%   Control Diabetic - 6-8%)  Comments: ...................................................................DONNA [facility]  January 07, 2007 2:17 PM

## 2010-04-04 NOTE — Assessment & Plan Note (Signed)
Summary: f/u [facility]   Vital Signs:  Patient profile:   55 year old male Weight:      272.8 pounds BMI:     35.88 Pulse rate:   83 / minute BP sitting:   130 / 85  (left arm)  History of Present Illness: Patient comes in today without major complaint.  States:"things are going about the same"  Blood Glucose readings in  7 day average CBGs - 344 14 day average CBGs - 361  Denies Hypoglycemia  Reports Nocturia X3-5 per night.  He also reports increased thirst - drinking diet soda and water.     Habits & Providers     Tobacco Status: never  Current Medications (verified): 1)  Prodigy Autocode Blood Glucose  Devi (Blood Glucose Monitoring Suppl) .... One Meter 2)  Glipizide Xl 10 Mg Tb24 (Glipizide) .... 2 Tablet By Mouth Daily 3)  Aspirin 81 Mg  Tbec (Aspirin) .... One By Mouth Every Day 4)  Lantus 100 Unit/ml Soln (Insulin Glargine) .... 40 Units in The Morning and 25 Units At Night.  Dispense One Month Supply 5)  Cvs Insulin Syringe 29g X 1/2" 1 Ml Misc (Insulin Syringe-Needle U-100) .... Quantity For Once Daily Dosing - 1 Month Supply 6)  Prodigy Twist Top Lancets 28g  Misc (Lancets) .... Use As Directed 7)  Prodigy Blood Glucose Test  Strp (Glucose Blood) .... Use As Directed 8)  Lisinopril-Hydrochlorothiazide 20-12.5 Mg Tabs (Lisinopril-Hydrochlorothiazide) .... Take 2 Tablets Once A Day  Allergies (verified): No Known Drug Allergies  Social History:    Smoking Status:  never   Impression & Recommendations:  Problem # 1:  DIABETES MELLITUS II, UNCOMPLICATED (ICD-250.00) Assessment Unchanged  Diabetes of: longstanding duration  currently under: poor control of blood glucose based on A1C recetn DBGs of >300. Control is suboptimal due EA:[id number] regimen, dietary indiscretion and likely loss of benefit from oral ulphonyluyrea.  Denies hypoglycemic events.  Adjusted Lanus to dose of 40 and 25 units.  Will attempt to utilize remaining Lantus and Glipizide THEN change to a  MIX insulin given two or three times daily.   F/U to start new regimen  Written pt instructions provided:   F/U Rx Clinic Visit: 2 weeks with Rx Clinic TTFFC: 40  mins.  Pt seen with:  Lilia Pro, PharmD Candidate  His updated medication list for this problem includes:    Glipizide Xl 10 Mg Tb24 (Glipizide) .Marland Kitchen... 2 tablet by mouth daily    Aspirin 81 Mg Tbec (Aspirin) ..... One by mouth every day    Lantus 100 Unit/ml Soln (Insulin glargine) .Marland KitchenMarland KitchenMarland KitchenMarland Kitchen 40 units in the morning and 25 units at night.  dispense one month supply    Lisinopril-hydrochlorothiazide 20-12.5 Mg Tabs (Lisinopril-hydrochlorothiazide) .Marland Kitchen... Take 2 tablets once a day  Orders: Reassessment Each 15 min unit- [facility] ([address])  Complete Medication List: 1)  Prodigy Autocode Blood Glucose Devi (Blood glucose monitoring suppl) .... One meter 2)  Glipizide Xl 10 Mg Tb24 (Glipizide) .... 2 tablet by mouth daily 3)  Aspirin 81 Mg Tbec (Aspirin) .... One by mouth every day 4)  Lantus 100 Unit/ml Soln (Insulin glargine) .... 40 units in the morning and 25 units at night.  dispense one month supply 5)  Cvs Insulin Syringe 29g X 1/2" 1 Ml Misc (Insulin syringe-needle u-100) .... Quantity for once daily dosing - 1 month supply 6)  Prodigy Twist Top Lancets 28g Misc (Lancets) .... Use as directed 7)  Prodigy Blood Glucose Test Strp (Glucose blood) .Marland KitchenMarland KitchenMarland Kitchen  Use as directed 8)  Lisinopril-hydrochlorothiazide 20-12.5 Mg Tabs (Lisinopril-hydrochlorothiazide) .... Take 2 tablets once a day  Patient Instructions: 1)  Increase your Lantus to 40 units in the morning. 2)  Increase your Lantus in 25 units in the evening. 3)  Continue all other medications without change. 4)  Try to eat a little less.  Cut back  alittle on your bread, potato, rice and pinto beans.    5)  Continue to exercise and be active.  6)  Return to Pharmacy Clinic in 2 weeks.

## 2010-04-04 NOTE — Assessment & Plan Note (Signed)
Summary: f/u eo   Vital Signs:  Patient profile:   55 year old male Height:      71 inches Weight:      258 pounds BMI:     36.11 Temp:     98.0 degrees F oral Pulse rate:   83 / minute BP sitting:   138 / 77  (left arm) Cuff size:   large  Vitals Entered By: Garen Grams LPN (August 15, 2009 10:29 AM) CC: f/u dm Is Patient Diabetic? Yes Did you bring your meter with you today? No Pain Assessment Patient in pain? no        CC:  f/u dm.  History of Present Illness: 1. DM:  He is not taking his insulin as prescribed.  He is taking his morning dose every other day and misses 3 doses / week of the evening insulin.  He doesn't take it because it makes him feel funny but he can't describe it.  He is checking his sugars everyday.  They are still  ~ 300.  He is still making poor food choices and eating lots of sweats.      ROS: denies chest pain, vision changes  Physical Exam: Vital signs reviewed Gen: alert in no acute distress CV: regular rate and rhythm without murmurs Resp: clear to auscultation bilaterally, normal work of breathing Abd: soft, non-tender, non-distended Ext: no lower extremity edema Psych: not depressed appearing. Strange affect.  Habits & Providers  Alcohol-Tobacco-Diet     Tobacco Status: never  Current Medications (verified): 1)  Prodigy Autocode Blood Glucose  Devi (Blood Glucose Monitoring Suppl) .... One Meter 2)  Aspirin 81 Mg  Tbec (Aspirin) .... One By Mouth Every Day 3)  Cvs Insulin Syringe 29g X 1/2" 1 Ml Misc (Insulin Syringe-Needle U-100) .... Quantity For Once Daily Dosing - 1 Month Supply 4)  Prodigy Twist Top Lancets 28g  Misc (Lancets) .... Use As Directed 5)  Prodigy Blood Glucose Test  Strp (Glucose Blood) .... Use As Directed 6)  Lantus 100 Unit/ml Soln (Insulin Glargine) .... Inject 50 Units in The Morning and 25 Units in The Evening 7)  Multivitamins  Tabs (Multiple Vitamin) .... One Daily 8)  Losartan Potassium-Hctz 50-12.5 Mg Tabs  (Losartan Potassium-Hctz) .... Take 1 Tab By Mouth Daily  Allergies: No Known Drug Allergies  Past History:  Past Medical History: Reviewed history from 05/24/2009 and no changes required. - T2DM - HTN - ?schizophrenia - nothing has been documented but pt often reacts to something in the room that I don't appreciate and refers to himself as "we" a lot. odd behaviors like making strange noises and washing hands in bleach - h/o elevated LFTs in Feb 2008 with neg hep panel and neg abd [place] except for fatty liver - FU LFTS 2008 normal  Social History: Reviewed history from 05/24/2009 and no changes required. Pt lives alone, Former 12 pack year smoker, former heavy drinker, no illicts; On disability--but pt won't elaborate. Jogs for exercise.  Denies tobacco, alcohol, or drug use   Impression & Recommendations:  Problem # 1:  DIABETES MELLITUS II, UNCOMPLICATED (ICD-250.00) Assessment Unchanged  Still not compliant with medications.  Encouraged him to take his Insulin as prescribed. His updated medication list for this problem includes:    Aspirin 81 Mg Tbec (Aspirin) ..... One by mouth every day    Lantus 100 Unit/ml Soln (Insulin glargine) ..... Inject 50 units in the morning and 25 units in the evening    Losartan  Potassium-hctz 50-12.5 Mg Tabs (Losartan potassium-hctz) .Marland Kitchen... Take 1 tab by mouth daily  Orders: [facility]- Est Level  3 ([address])  Complete Medication List: 1)  Prodigy Autocode Blood Glucose Devi (Blood glucose monitoring suppl) .... One meter 2)  Aspirin 81 Mg Tbec (Aspirin) .... One by mouth every day 3)  Cvs Insulin Syringe 29g X 1/2" 1 Ml Misc (Insulin syringe-needle u-100) .... Quantity for once daily dosing - 1 month supply 4)  Prodigy Twist Top Lancets 28g Misc (Lancets) .... Use as directed 5)  Prodigy Blood Glucose Test Strp (Glucose blood) .... Use as directed 6)  Lantus 100 Unit/ml Soln (Insulin glargine) .... Inject 50 units in the morning and 25 units in the  evening 7)  Multivitamins Tabs (Multiple vitamin) .... One daily 8)  Losartan Potassium-hctz 50-12.5 Mg Tabs (Losartan potassium-hctz) .... Take 1 tab by mouth daily  Patient Instructions: 1)  You need to take your medicines as prescribed everyday 2)  Please schedule a follow up appointment in 8 weeks

## 2010-04-04 NOTE — Assessment & Plan Note (Signed)
Summary: [facility]    Vital Signs:  Patient Profile:   55 Years Old Male Height:     73.25 inches Weight:      274 pounds BMI:     36.03 Pulse rate:   73 / minute BP sitting:   165 / 100  (left arm)                 Diabetes Management History:      The patient is a 55 years old male who comes in for evaluation of DM Type 2.  He is (or has been) enrolled in the "Diabetic Education Program".  He is checking home blood sugars.  He says that he is exercising.  Type of exercise includes: running.  Duration of exercise is estimated to be 90 min.  He is doing this 3 times per week.        Hypoglycemic symptoms are not occurring.  Other comments include: Has new Prodigy Meter - appears to be using correctly.        His home fasting blood sugars are as follows: highest: 281; lowest: 161; average: 180.  His 4:00 PM blood sugars reveal: highest: 188; lowest: 85; average: 170.       Current Allergies (reviewed today): No known allergies         Impression & Recommendations:  Problem # 1:  DIABETES MELLITUS II, UNCOMPLICATED (ICD-250.00) Patient remains higher than blood sugar goals despite recent change  in Lantus dosing.  Patient appears to be adherent with Lantus 40 units each morning.  Patient willing to increase Lantus dose.  Provided sample medication 1 vial Lantus [id number] Exp 04/2009 to assist with extra medication needed.   Will continue with plan to decrease AM Lantus dose to 20 units if he has not food available.    The following medications were removed from the medication list:    Metformin Hcl 1000 Mg Tabs (Metformin hcl) .Marland Kitchen... Take 1 tablet by mouth two times a day    Enalapril Maleate 20 Mg Tabs (Enalapril maleate) .Marland Kitchen... 1 tablet daily  His updated medication list for this problem includes:    Glipizide Xl 10 Mg Tb24 (Glipizide) .Marland Kitchen... 2 tablet by mouth daily    Aspirin 81 Mg Tbec (Aspirin) ..... One by mouth every day    Lantus 100  Unit/ml Soln (Insulin glargine) .Marland KitchenMarland KitchenMarland KitchenMarland Kitchen 45 units once daily in the morning. dispense 1 month supply    Lisinopril 40 Mg Tabs (Lisinopril) ..... One daily - this medication replaces your enalapril  Orders: Reassessment Each 15 min unit- [facility] (21308)   Problem # 2:  HYPERTENSION, BENIGN SYSTEMIC (ICD-401.1) Assessment: Unchanged Blood Pressure remains greater than goal despite two drug regimen.  Patient complains of diarrhea with enalpril. He admits to being non-adherent due to diarrhea.   Will try lisinopril in an attempt to minimize diarrhea.  Will tx with dose of 40mg  lisinopril to attempt improved BP control.  Patient willing to take one baby aspirin daily.   The following medications were removed from the medication list:    Enalapril Maleate 20 Mg Tabs (Enalapril maleate) .Marland Kitchen... 1 tablet daily  His updated medication list for this problem includes:    Amlodipine Besylate 10 Mg Tabs (Amlodipine besylate) ..... One tablet daily    Lisinopril 40 Mg Tabs (Lisinopril) ..... One daily - this medication replaces your enalapril  Orders: Reassessment Each 15 min unit- [facility] (65784)   Complete Medication List: 1)  Prodigy Autocode Blood  Glucose Devi (Blood glucose monitoring suppl) .... One meter 2)  Amlodipine Besylate 10 Mg Tabs (Amlodipine besylate) .... One tablet daily 3)  Glipizide Xl 10 Mg Tb24 (Glipizide) .... 2 tablet by mouth daily 4)  Aspirin 81 Mg Tbec (Aspirin) .... One by mouth every day 5)  Lantus 100 Unit/ml Soln (Insulin glargine) .... 45 units once daily in the morning. dispense 1 month supply 6)  Cvs Insulin Syringe 29g X 1/2" 1 Ml Misc (Insulin syringe-needle u-100) .... Quantity for once daily dosing - 1 month supply 7)  Prodigy Twist Top Lancets 28g Misc (Lancets) .... Use as directed 8)  Prodigy Blood Glucose Test Strp (Glucose blood) .... Use as directed 9)  Lisinopril 40 Mg Tabs (Lisinopril) .... One daily - this medication replaces your enalapril  Diabetes Management  Assessment/Plan:      The following lipid goals have been established for the patient: Total cholesterol goal of 200; LDL cholesterol goal of 100; HDL cholesterol goal of 40; Triglyceride goal of 200.  His blood pressure goal is < 130/80.     Patient Instructions: 1)  Continue to exercise every other day or more often if you are able.  2)  Take Lantus 45 units each morning.  3)  If you DO NOT PLAN to eat - only take Lantus 20 units and monitor your blood sugar more closely.  4)  No changes in any of your other medications.  5)  Use the last few ENALAPRIL - THEN start new blood pressure medication Lisinopril 40mg .    Prescriptions: LISINOPRIL 40 MG TABS (LISINOPRIL) one daily - This medication replaces your enalapril  #30 x 5   Entered by:   Madelon Lips PHARMD   Authorized by:   Lupita Raider MD   Signed by:   Madelon Lips PHARMD on 02/23/2008   Method used:   [facility] [address] [phone]* (retail)       [address]       [place], [place]  [address]       Ph: [phone] or [phone]       Fax: [phone]   RxID:   [id number]  ]

## 2010-04-06 NOTE — Assessment & Plan Note (Signed)
Summary: F/U EO   Vital Signs:  Patient profile:   55 year old male Height:      71 inches Weight:      249.3 pounds BMI:     34.90 Temp:     97.9 degrees F oral Pulse rate:   85 / minute BP sitting:   140 / 108  (left arm) Cuff size:   large  Vitals Entered By: Garen Grams LPN (March 03, 2010 1:53 PM) CC: f/u dm, bp Is Patient Diabetic? Yes Pain Assessment Patient in pain? no        CC:  f/u dm and bp.  History of Present Illness: 1. DMII:  He is not taking his insulin as prescribed.  He just takes however much that he thinks that he needs.  He is not taking it twice a day.  He sometimes will take it in the evening.  When he does take it he thinks that he is taking about 60 Units of the Lantus.  He is not watching his carb intake.  He does check his sugars and for the most part they are elevated.  average around 300.  sometimes it is in the 100's  ROS: denies vision changes  2. HTN:  He is not taking his medications as prescribed.  He may take them everyother day.  He doesn't like the way that they make him feel.  He thinks that he might be getting some skin tingling with this BP medication.  He always comes up with some excuse to not take his medicines.  ROS:  denies chest pain, shortness of breath  Habits & Providers  Alcohol-Tobacco-Diet     Tobacco Status: never     Year Quit: 1994  Current Medications (verified): 1)  Accu-Chek Soft Touch Device  Misc (Lancet Devices) .... One Meter 2)  Aspirin 81 Mg  Tbec (Aspirin) .... One By Mouth Every Day 3)  Cvs Insulin Syringe 29g X 1/2" 1 Ml Misc (Insulin Syringe-Needle U-100) .... Quantity For Once Daily Dosing - 1 Month Supply 4)  Accu-Chek Soft Touch Lancets  Misc (Lancets) .... Use As Directed 5)  Lantus 100 Unit/ml Soln (Insulin Glargine) .... Inject 50 Units in The Morning and 30 Units At Night 6)  Multivitamins  Tabs (Multiple Vitamin) .... One Daily 7)  Losartan Potassium-Hctz 50-12.5 Mg Tabs (Losartan  Potassium-Hctz) .... Take 1 Tab By Mouth Daily 8)  Metoprolol Succinate 25 Mg Xr24h-Tab (Metoprolol Succinate) .... Take 1 Tab By Mouth Daily For Blood Pressure  Allergies: No Known Drug Allergies  Past History:  Past Medical History: Reviewed history from 05/24/2009 and no changes required. - T2DM - HTN - ?schizophrenia - nothing has been documented but pt often reacts to something in the room that I don't appreciate and refers to himself as "we" a lot. odd behaviors like making strange noises and washing hands in bleach - h/o elevated LFTs in Feb 2008 with neg hep panel and neg abd [place] except for fatty liver - FU LFTS 2008 normal  Social History: Reviewed history from 05/24/2009 and no changes required. Pt lives alone, Former 12 pack year smoker, former heavy drinker, no illicts; On disability--but pt won't elaborate. Jogs for exercise.  Denies tobacco, alcohol, or drug use  Physical Exam  General:  Vitals reviewed. obese AAM,in no acute distress; alert,appropriate and cooperative throughout examination. has odd movements and facial expressions.  Lungs:  normal respiratory effort, no crackles, and no wheezes.   Heart:  normal rate, regular  rhythm, no murmur, and no gallop.   Abdomen:  Bowel sounds positive,abdomen soft and non-tender without masses Extremities:  no edema.  Psych:  normally interactive and good eye contact.  Has some odd facial movements and expressions   Impression & Recommendations:  Problem # 1:  DIABETES MELLITUS II, UNCOMPLICATED (ICD-250.00) Assessment Unchanged  Not at goal.  Noncompliance is the main issue.  Advised him to take his medicines as directed. His updated medication list for this problem includes:    Aspirin 81 Mg Tbec (Aspirin) ..... One by mouth every day    Lantus 100 Unit/ml Soln (Insulin glargine) ..... Inject 50 units in the morning and 30 units at night    Losartan Potassium-hctz 50-12.5 Mg Tabs (Losartan potassium-hctz) .Marland Kitchen... Take 1  tab by mouth daily  Orders: [facility]- Est Level  3 ([address])  Problem # 2:  HYPERTENSION, BENIGN SYSTEMIC (ICD-401.1) Assessment: Unchanged  Not at goal.  Noncompliance is the main issue.  Advised him to take his medicines as directed. His updated medication list for this problem includes:    Losartan Potassium-hctz 50-12.5 Mg Tabs (Losartan potassium-hctz) .Marland Kitchen... Take 1 tab by mouth daily    Metoprolol Succinate 25 Mg Xr24h-tab (Metoprolol succinate) .Marland Kitchen... Take 1 tab by mouth daily for blood pressure  Orders: [facility]- Est Level  3 (98119)  Complete Medication List: 1)  Accu-chek Soft Touch Device Misc (Lancet devices) .... One meter 2)  Aspirin 81 Mg Tbec (Aspirin) .... One by mouth every day 3)  Cvs Insulin Syringe 29g X 1/2" 1 Ml Misc (Insulin syringe-needle u-100) .... Quantity for once daily dosing - 1 month supply 4)  Accu-chek Soft Touch Lancets Misc (Lancets) .... Use as directed 5)  Lantus 100 Unit/ml Soln (Insulin glargine) .... Inject 50 units in the morning and 30 units at night 6)  Multivitamins Tabs (Multiple vitamin) .... One daily 7)  Losartan Potassium-hctz 50-12.5 Mg Tabs (Losartan potassium-hctz) .... Take 1 tab by mouth daily 8)  Metoprolol Succinate 25 Mg Xr24h-tab (Metoprolol succinate) .... Take 1 tab by mouth daily for blood pressure Prescriptions: ACCU-CHEK SOFT TOUCH LANCETS  MISC (LANCETS) Use as directed  #100 x 1   Entered and Authorized by:   Angelena Sole MD   Signed by:   Angelena Sole MD on 03/03/2010   Method used:   [facility]        [facility] [address] [phone]* (retail)       [address]       [place], [place]  [address]       Ph: [phone] or [phone]       Fax: [phone]   RxID:   [id number] ACCU-CHEK SOFT TOUCH DEVICE  MISC (LANCET DEVICES) one meter  #1 x 0   Entered and Authorized by:   Angelena Sole MD   Signed by:   Angelena Sole MD on 03/03/2010   Method used:   [facility]        [facility] [address] [phone]* (retail)       [address]       [place], [place]  [address]       Ph: [phone] or [phone]       Fax: [phone]   RxID:   [id number]    Orders Added: 1)  [facility]- Est Level  3 [[address]]

## 2010-05-09 ENCOUNTER — Other Ambulatory Visit: Payer: Self-pay | Admitting: Family Medicine

## 2010-05-09 NOTE — Telephone Encounter (Signed)
Refill request

## 2010-05-11 ENCOUNTER — Ambulatory Visit (INDEPENDENT_AMBULATORY_CARE_PROVIDER_SITE_OTHER): Payer: Medicaid Other | Admitting: Family Medicine

## 2010-05-11 ENCOUNTER — Encounter: Payer: Self-pay | Admitting: Family Medicine

## 2010-05-11 VITALS — BP 145/90 | HR 72 | Temp 97.7°F | Wt 250.2 lb

## 2010-05-11 DIAGNOSIS — E119 Type 2 diabetes mellitus without complications: Secondary | ICD-10-CM

## 2010-05-11 DIAGNOSIS — I10 Essential (primary) hypertension: Secondary | ICD-10-CM

## 2010-05-11 DIAGNOSIS — E1165 Type 2 diabetes mellitus with hyperglycemia: Secondary | ICD-10-CM

## 2010-05-11 DIAGNOSIS — IMO0002 Reserved for concepts with insufficient information to code with codable children: Secondary | ICD-10-CM

## 2010-05-11 DIAGNOSIS — IMO0001 Reserved for inherently not codable concepts without codable children: Secondary | ICD-10-CM

## 2010-05-11 LAB — POCT GLYCOSYLATED HEMOGLOBIN (HGB A1C): Hemoglobin A1C: >14

## 2010-05-11 MED ORDER — LOSARTAN POTASSIUM-HCTZ 50-12.5 MG PO TABS: 1.0000 | ORAL_TABLET | Freq: Every day | ORAL | Status: DC

## 2010-05-11 NOTE — Assessment & Plan Note (Signed)
Not at goal.  Only taking the Insulin once in a while.  Spent time counseling patient on the importance of taking the Insuline as prescribed.  Medication noncompliance has been a significant issue.

## 2010-05-11 NOTE — Assessment & Plan Note (Signed)
Pt not taking her medications as prescribed.  Stopped taking metoprolol because it made him "feel funny" just like all of the other antihypertensives that we have tried.  He also stopped taking the Losartan HCTZ because he didn't think that he was supposed to still be taking it.  Will stop the Metoprolol and just continue with the Losartan-HCTZ for now because of patient preference.  Spent time counseling him on the risks of elevated BP including heart attack and stroke.  He voiced his understanding.

## 2010-05-11 NOTE — Progress Notes (Signed)
  Subjective:    Patient ID: [name], male    DOB: [birth year], 55 y.o.   MRN: [id number]  HPI    Review of Systems     Objective:   Physical Exam        Assessment & Plan:   Subjective:     [name] is a 55 y.o. male who presents for follow up of diabetes.. Current symptoms include: none. Patient denies foot ulcerations, hypoglycemia , nausea, polydipsia and visual disturbances. Evaluation to date has been: hemoglobin A1C. Home sugars: BGs range between 200 and 400. Current treatments: Continued insulin which has been ineffective because of patient noncompliance. Last dilated eye exam .  The following portions of the patient's history were reviewed and updated as appropriate: allergies, current medications, past family history, past medical history, past social history, past surgical history and problem list.  Review of Systems Pertinent items are noted in HPI.    Objective:    General appearance: alert Eyes: conjunctivae/corneas clear. PERRL, EOM's intact. Fundi benign. Lungs: clear to auscultation bilaterally Heart: regular rate and rhythm, S1, S2 normal, no murmur, click, rub or gallop Abdomen: soft, non-tender; bowel sounds normal; no masses,  no organomegaly      Patient was not evaluated for proper footwear and sizing.  Laboratory: No components found with this basename: A1C      Assessment:    Diabetes mellitus Type II, under poor control.    Plan:    Discussed general issues about diabetes pathophysiology and management. Counseling at today's visit: discussed the need for weight loss and discussed DASH diet.  Subjective:    Patient here for follow-up of elevated blood pressure.  He is exercising and is not adherent to a low-salt diet.  Blood pressure is not well controlled at home. Cardiac symptoms: none. Patient denies: chest pain, chest pressure/discomfort, dyspnea and irregular heart beat. Cardiovascular risk factors: diabetes mellitus. Use  of agents associated with hypertension: none. History of target organ damage: none.  The following portions of the patient's history were reviewed and updated as appropriate: allergies, current medications, past family history, past medical history, past social history, past surgical history and problem list.  Review of Systems Pertinent items are noted in HPI.     Objective:        Assessment:    Hypertension, stage 1 . Evidence of target organ damage: none.    Plan:    Pt not taking the Metoprolol because it makes him feel funny.  He always makes an excuse to not take his medications.  Will advise him to continue with the Losartan only for now.

## 2010-06-03 ENCOUNTER — Other Ambulatory Visit: Payer: Self-pay | Admitting: Family Medicine

## 2010-06-04 NOTE — Telephone Encounter (Signed)
Refill request

## 2010-06-06 ENCOUNTER — Telehealth: Payer: Self-pay | Admitting: Family Medicine

## 2010-06-06 NOTE — Telephone Encounter (Signed)
[name] is out of insulin and is requesting refill.  [facility] [address]  Pt called pharmacy last week to have them contact us.  Pharmacy have not received response.

## 2010-06-06 NOTE — Telephone Encounter (Signed)
Refill request for patient of Dr. Saunder's 

## 2010-06-06 NOTE — Telephone Encounter (Signed)
Forward to [employer]

## 2010-06-08 MED ORDER — INSULIN GLARGINE 100 UNIT/ML ~~LOC~~ SOLN: 30.0000 [IU] | Freq: Every day | SUBCUTANEOUS | Status: DC

## 2010-06-08 NOTE — Telephone Encounter (Signed)
Refill sent in

## 2010-06-15 LAB — GLUCOSE, CAPILLARY: Glucose-Capillary: 247 mg/dL — ABNORMAL HIGH (ref 70–99)

## 2010-07-13 ENCOUNTER — Encounter: Payer: Self-pay | Admitting: Family Medicine

## 2010-07-13 DIAGNOSIS — Z9114 Patient's other noncompliance with medication regimen: Secondary | ICD-10-CM | POA: Insufficient documentation

## 2010-07-21 NOTE — H&P (Signed)
[name], [name] NO.:  [id number]   MEDICAL RECORD NO.:  [id number]          PATIENT TYPE:  INP   LOCATION:  5013                         FACILITY:  [facility]   PHYSICIAN:  Madeleine B. Vanstory, M.D.DATE OF BIRTH:  02/12/1956   DATE OF ADMISSION:  03/28/2004  DATE OF DISCHARGE:                                HISTORY & PHYSICAL   CHIEF COMPLAINT:  Feeling weak, polyuria, polydipsia.   HISTORY OF PRESENT ILLNESS:  This is a 55 year old African American male  with no primary care no complain of a seven-day history of polyuria,  polydipsia, and general malaise. The patient admits to nausea and heartburn  over the same time. No emesis, no chest pain, no shortness of breath. One  episode of nonbloody diarrhea resolved after Pepto-Bismol.   REVIEW OF SYSTEMS:  Otherwise negative.   PAST MEDICAL HISTORY:  The patient was placed on medical disability one year  ago for mental reasons which he cannot elaborate on. No surgical history.  No medications. No known drug allergies.   SOCIAL HISTORY:  On disability as above. Previously homeless times 10 years.  Former 12-pack year history of smoking. Former regular heavy alcohol abuse.  Denies IV drug use.   FAMILY HISTORY:  Mother died secondary to unknown cancer when the patient  was 13. His father died 10 years ago secondary to complications associated  with CVA. Brother died last year of unknown causes. Sister died of unknown  cancer several years ago.   PHYSICAL EXAMINATION:  VITAL SIGNS: Temperature 97.3, pulse 86 to 99, blood  pressure 134/86, respiratory rate 95% on room air.  GENERAL: The patient is lying on a stretcher in no acute distress, well-  nourished, alert, and oriented times three.  HEENT: Head normocephalic and atraumatic. Extraocular movements intact.  Pupils equal, round, and reactive to light. Moist mucous membranes. No  lymphadenopathy.  CV: Regular rate and rhythm with no murmurs, rubs, or  gallops.  RESPIRATORY: No chest wall tenderness to palpation.  LUNGS: Clear to auscultation bilaterally.  BACK: Positive CVA tenderness to palpation, right greater than left.  ABDOMEN: Obese, soft, nontender. Positive bowel sounds.  EXTREMITIES: No edema, no ulcers, no rash. Capillary refill less than 4  seconds. Reflexes 5/5 upper and lower extremities bilaterally.  NEUROLOGIC: Cranial nerves III-XII intact. No sensory deficits.   LABORATORY DATA:  UA shows specific gravity  1.040, glucose greater than  1000, ketones 15, protein greater than 300, __________ positive mucus. White  count 8.9, hemoglobin 16.5, platelet count 383,000. Sodium 129, potassium  4.4, chloride 98, bicarbonate 26.7, BUN 20, creatinine 1.3, glucose 490.  Urine drug screen pending. Hgb A1C pending.   ASSESSMENT/PLAN:  This is a 55 year old African American male with poor  primary care and hyperglycemia.  1.  Hyperglycemia. Based on history, relative dehydration, and increased      CBGs, the patient's working diagnosis is new onset non-insulin-dependent      diabetes mellitus. Will check a hemoglobin A1C, provide IV fluids, and      normal saline. Place on sliding scale. Place on glipizide and Glucophage  as well as diabetic teaching. Check CBGs q.4h..  2.  Hypernatremia. Uncertain etiology. Likely secondary to elevated glucose.      Will consider checking serum albumin and urine sodium, if the problem      does not resolve with glucose correction.  3.  Cysto. Patient with strange affect and history of being on disability.      Will consult social work to clarify for __________ for diabetic      medications.      MBV/MEDQ  D:  03/28/2004  T:  03/28/2004  Job:  [id number]

## 2010-07-21 NOTE — Discharge Summary (Signed)
NAMEAPRIL, CARLYON NO.:  [id number]   MEDICAL RECORD NO.:  [id number]          PATIENT TYPE:  INP   LOCATION:  5013                         FACILITY:  [facility]   PHYSICIAN:  Madeleine B. Vanstory, M.D.DATE OF BIRTH:  [birth year]   DATE OF ADMISSION:  03/28/2004  DATE OF DISCHARGE:  03/30/2004                                 DISCHARGE SUMMARY   DISCHARGE DIAGNOSIS:  Newly diagnosed diabetes type 2.   DISCHARGE MEDICATIONS:  1.  Glipizide 10 mg by mouth twice daily.  2.  Glucophage 500 mg by mouth twice daily.   HOSPITAL COURSE:  The patient is a 55 year old, African-American male with  no previous primary care, who presented with a 7-day history of polyuria and  polydipsia and general malaise.  Blood sugars on admission were in the 400s.  HbA1C was 11.9.  Patient was hydrated and started on glipizide and  Glucophage.  Sugars on discharge were in the low to mid 200s.  Social work  consult ordered as the patient mentioned but could not elaborate on a mental  disability for which she is on disability.  Social work consult ordered.  Patient evaluated and no outside need for resources at this time.  Diabetes  teaching provided via video and pamphlets. Outpatient diabetes teaching  referral made.   CONDITION ON DISCHARGE:  Stable.   DISCHARGE DIET:  Heart healthy, __________ blood sugar, low fat.   FOLLOW UP:  Madeleine B. Erenest Rasher, M.D. at [facility] February 15, at  1:30 p.m.      MBV/MEDQ  D:  03/30/2004  T:  03/30/2004  Job:  [phone]

## 2010-08-23 ENCOUNTER — Ambulatory Visit (INDEPENDENT_AMBULATORY_CARE_PROVIDER_SITE_OTHER): Payer: Medicaid Other | Admitting: Family Medicine

## 2010-08-23 ENCOUNTER — Encounter: Payer: Self-pay | Admitting: Family Medicine

## 2010-08-23 DIAGNOSIS — I10 Essential (primary) hypertension: Secondary | ICD-10-CM

## 2010-08-23 DIAGNOSIS — E119 Type 2 diabetes mellitus without complications: Secondary | ICD-10-CM

## 2010-08-23 LAB — POCT GLYCOSYLATED HEMOGLOBIN (HGB A1C): Hemoglobin A1C: >14

## 2010-08-23 NOTE — Assessment & Plan Note (Signed)
BP at goal.  I am surprised that he has actually been taking his blood pressure medications because of his hx of noncompliance.  I am pleased with his blood pressure today.

## 2010-08-23 NOTE — Patient Instructions (Signed)
Please take your medications as prescribed. I am pleased with your blood pressure today

## 2010-08-23 NOTE — Assessment & Plan Note (Signed)
Blood sugar not at goal.  He is non-compliant.  Reviewed the importance of taking Insulin as prescribed.

## 2010-08-23 NOTE — Progress Notes (Signed)
  Subjective:    Patient ID: [name], male    DOB: 08-07-1955, 55 y.o.   MRN: [id number]  HPI 1. DMII:  He is not taking his insulin as prescribed.  He takes the amount that he feels he needs anywhere from 0 units to 60 units.  He has been checking his blood sugars and they have been elevated.  Average CBGs between 200-300.    2. HTN:  He has actually been taking his blood pressure medications.  He started taking them because he "felt funny" but now he feels better.  He doesn't check his blood pressure at home.   Review of Systems Denies chest pain, shortness of breath.  Denies headache or vision changes    Objective:   Physical Exam  Vitals reviewed. Constitutional: No distress.  Cardiovascular: Normal rate and regular rhythm.   Pulmonary/Chest: Effort normal and breath sounds normal.  Musculoskeletal: He exhibits no edema.  Psychiatric:       Strange affect          Assessment & Plan:

## 2010-11-01 ENCOUNTER — Other Ambulatory Visit: Payer: Self-pay | Admitting: Family Medicine

## 2010-11-01 NOTE — Telephone Encounter (Signed)
Refill request

## 2011-02-01 ENCOUNTER — Other Ambulatory Visit: Payer: Self-pay | Admitting: Family Medicine

## 2011-02-01 NOTE — Telephone Encounter (Signed)
Refill request

## 2011-02-02 NOTE — Telephone Encounter (Signed)
Will give 1 month refill. Patient needs appointment within next month.

## 2011-02-02 NOTE — Telephone Encounter (Signed)
Patient notified

## 2011-02-15 ENCOUNTER — Ambulatory Visit (INDEPENDENT_AMBULATORY_CARE_PROVIDER_SITE_OTHER): Payer: Medicaid Other | Admitting: Family Medicine

## 2011-02-15 ENCOUNTER — Encounter: Payer: Self-pay | Admitting: Family Medicine

## 2011-02-15 VITALS — BP 166/101 | HR 86 | Temp 98.1°F | Ht 71.0 in | Wt 236.4 lb

## 2011-02-15 DIAGNOSIS — F2089 Other schizophrenia: Secondary | ICD-10-CM

## 2011-02-15 DIAGNOSIS — E119 Type 2 diabetes mellitus without complications: Secondary | ICD-10-CM

## 2011-02-15 DIAGNOSIS — I10 Essential (primary) hypertension: Secondary | ICD-10-CM

## 2011-02-15 LAB — POCT GLYCOSYLATED HEMOGLOBIN (HGB A1C): Hemoglobin A1C: >14

## 2011-02-15 MED ORDER — CARVEDILOL 6.25 MG PO TABS: 6.2500 mg | ORAL_TABLET | Freq: Two times a day (BID) | ORAL | Status: DC

## 2011-02-15 NOTE — Progress Notes (Signed)
  Subjective:    Patient ID: [name], male    DOB: March 13, 1955, 55 y.o.   MRN: [id number]  HPI Routine follow-up:  1. Diabetes Takes insulin about once a week. Injections hurt his stomach afterwards. Pain lasts for a while afterwards. Rotating sites on stomach does not hurt.  Non-compliant with hyzaar and metoprolol as well. Hurts his stomach.  Home sugars: 300s at home. Knows goal is 100s.  ROS: denies constipation, chest pain, paresthesias, vision changes  2. HTN See above regarding medication compliance ROS: denies headache, lightheadedness  Review of Systems Per HPI    Objective:   Physical Exam Gen: NAD Psych: appears to have lower baseline IQ, appropriate to questions CV: RRR, no m/r/g Pulm: CTAB, no w/r/r Abd: NABS, soft, NT, ND, no bruising/indurated areas/other abnormalities Ext: no edema    Assessment & Plan:

## 2011-02-15 NOTE — Assessment & Plan Note (Signed)
Elevated but asymptomatic. Taking medications about once weekly. Metoprolol causes abdominal pain so will switch to Coreg since it also has better glycemic profile in this patient with diabetes. Patient reports not wanting to take medications since he does not feel bad from his high blood pressure. Reviewed importance of taking blood pressure medications, especially due to concern for stroke/heart attack/renal problems. Follow-up in 1 month.

## 2011-02-15 NOTE — Patient Instructions (Signed)
Try injecting insulin in your legs instead of your abdomen for the next month.  I will change your metoprolol to another medication called Coreg.   Follow-up in 1 month.

## 2011-02-15 NOTE — Assessment & Plan Note (Signed)
Remains persistently elevated. Non-compliant with insulin which he takes about once weekly. Since injections cause abdominal pain, recommended injections in thighs instead. Stressed importance of taking insulin. Consider adding oral medications (metformin, glipizide, Januvia) is still insulin difficult to take at follow-up in 1 month.

## 2011-02-16 LAB — BASIC METABOLIC PANEL
BUN: 14 mg/dL (ref 6–23)
CO2: 27 mEq/L (ref 19–32)
Calcium: 9.6 mg/dL (ref 8.4–10.5)
Chloride: 100 mEq/L (ref 96–112)
Creat: 0.93 mg/dL (ref 0.50–1.35)
Glucose, Bld: 404 mg/dL — ABNORMAL HIGH (ref 70–99)
Potassium: 3.7 mEq/L (ref 3.5–5.3)
Sodium: 137 mEq/L (ref 135–145)

## 2011-02-18 ENCOUNTER — Encounter: Payer: Self-pay | Admitting: Family Medicine

## 2011-03-19 ENCOUNTER — Ambulatory Visit: Payer: Medicaid Other | Admitting: Family Medicine

## 2011-03-27 ENCOUNTER — Encounter: Payer: Self-pay | Admitting: Family Medicine

## 2011-03-27 ENCOUNTER — Ambulatory Visit (INDEPENDENT_AMBULATORY_CARE_PROVIDER_SITE_OTHER): Payer: Medicaid Other | Admitting: Family Medicine

## 2011-03-27 VITALS — BP 164/87 | HR 85 | Temp 98.1°F | Ht 71.0 in | Wt 228.0 lb

## 2011-03-27 DIAGNOSIS — Z9119 Patient's noncompliance with other medical treatment and regimen: Secondary | ICD-10-CM

## 2011-03-27 DIAGNOSIS — I10 Essential (primary) hypertension: Secondary | ICD-10-CM

## 2011-03-27 DIAGNOSIS — E119 Type 2 diabetes mellitus without complications: Secondary | ICD-10-CM

## 2011-03-27 DIAGNOSIS — Z91148 Patient's other noncompliance with medication regimen for other reason: Secondary | ICD-10-CM

## 2011-03-27 DIAGNOSIS — Z9114 Patient's other noncompliance with medication regimen: Secondary | ICD-10-CM

## 2011-03-27 MED ORDER — GLIPIZIDE 5 MG PO TABS: 5.0000 mg | ORAL_TABLET | Freq: Two times a day (BID) | ORAL | Status: DC

## 2011-03-27 NOTE — Patient Instructions (Signed)
Please try to use the insulin twice a day if possible or at least once a day. Start glipizide 5 mg twice a day to help your sugars. This is an oral medication.  Take the Hyzaar every day.   Follow-up in 1 week.

## 2011-03-27 NOTE — Assessment & Plan Note (Signed)
Unchanged. Does not like Cozaar (makes him "feel bad"). Encouraged regular Hyzaar use (currently using once a week).

## 2011-03-27 NOTE — Assessment & Plan Note (Signed)
Continues to be non-compliant with insulin. Starting glipizide today. Patient open to trying oral medications. Again counseled about complications of poorly controlled diabetes.  Denies wanting or needing counseling regarding diabetic diet. Patient says he knows what to do (and aware of carbohydrate counting) but acknowledges problem is implementing what he knows.

## 2011-03-27 NOTE — Progress Notes (Signed)
  Subjective:    Patient ID: MURVIN GIFT, male    DOB: 1955/10/13, 56 y.o.   MRN: [id number]  HPI Follow-up:  1. Diabetes Uses insulin once a week (instead of twice a day). Does not like injecting himself. Interested in oral medications.  ROS: denies difficulty urinating, vision changes, paresthesias  2. HTN Coreg makes him "feel bad".  Takes Hyzaar once a week (instead of once daily). ROS: denies chest pain, difficulty breathing  Review of Systems Per HPI    Objective:   Physical Exam Gen: NAD Psych: ?baseline mental retardation CV: RRR Pulm: NI WOB, CTAB without w/r/r Ext: no edema Feet: no lesions, toe nails short, sensation intact, 2+ pulses    Assessment & Plan:

## 2011-03-27 NOTE — Assessment & Plan Note (Signed)
Continues to be a significant issue.  Will try to have closer follow-up and try to come up with a regimen that he feels is doable.

## 2011-03-29 ENCOUNTER — Ambulatory Visit: Payer: Medicaid Other | Admitting: Family Medicine

## 2011-04-05 ENCOUNTER — Ambulatory Visit: Payer: Medicaid Other | Admitting: Family Medicine

## 2011-04-26 ENCOUNTER — Encounter: Payer: Self-pay | Admitting: Family Medicine

## 2011-04-26 ENCOUNTER — Ambulatory Visit (INDEPENDENT_AMBULATORY_CARE_PROVIDER_SITE_OTHER): Payer: Medicaid Other | Admitting: Family Medicine

## 2011-04-26 VITALS — BP 130/92 | HR 94 | Temp 97.7°F | Ht 71.0 in | Wt 225.0 lb

## 2011-04-26 DIAGNOSIS — E119 Type 2 diabetes mellitus without complications: Secondary | ICD-10-CM

## 2011-04-26 DIAGNOSIS — I10 Essential (primary) hypertension: Secondary | ICD-10-CM

## 2011-04-26 LAB — GLUCOSE, CAPILLARY: Glucose-Capillary: 429 mg/dL — ABNORMAL HIGH (ref 70–99)

## 2011-04-26 MED ORDER — INSULIN GLARGINE 100 UNIT/ML ~~LOC~~ SOLN: 30.0000 [IU] | Freq: Every day | SUBCUTANEOUS | Status: DC

## 2011-04-26 MED ORDER — LOSARTAN POTASSIUM-HCTZ 50-12.5 MG PO TABS: 1.0000 | ORAL_TABLET | Freq: Every day | ORAL | Status: DC

## 2011-04-26 MED ORDER — GLIPIZIDE 10 MG PO TABS: 10.0000 mg | ORAL_TABLET | Freq: Two times a day (BID) | ORAL | Status: DC

## 2011-04-26 NOTE — Progress Notes (Signed)
  Subjective:    Patient ID: [name], male    DOB: 06-23-1955, 56 y.o.   MRN: [id number]  HPI Follow-up of diabetes. Started glipizide 1 month ago. Reports taking about 4/7 days. Home BS: usually 350s Uses insulin very infrequently (couple of times a month). Because of pain after injections. 4/10 pain lasts for a few hours. Says it hurts more after insulin than not getting insulin.   ROS: denies chest pain, paresthesias, feeling unwell, dizziness  2. HTN Reports taking Hyzaar 4/7 days.   Review of Systems Per HPI    Objective:   Physical Exam Gen: NAD Psych: baseline high functioning MR? HEENT: MMM CV: RRR without m/r/g Pulm: CTAB without rales Ext: no edema Skin: no erythema, induration, swelling; areas of few mm diameter hyperpigmented areas of skin where he has injected himself  POCT Glu: 429    Assessment & Plan:

## 2011-04-26 NOTE — Assessment & Plan Note (Signed)
Improved. Continue Hyzaar. Uses about 4/7 days. Does not take Coreg so will remove from medication list. History of very poor compliance and unwillingness to take medications.

## 2011-04-26 NOTE — Patient Instructions (Signed)
Try to use insulin twice a week.  Increase glipizide to 10 mg twice a day.  Take 2 tablets twice a day for now. After you get your new prescription, take 1 tablet a day.   Follow-up in 1 month.

## 2011-04-26 NOTE — Assessment & Plan Note (Signed)
Still non-compliant with insulin due to pain after injections. Rotating sites do not help. Discussed at length complications of poorly controlled diabetes. I am not sure if patient fully understands with his baseline mental handicap; highly functioning mental retardation it seems. We agreed to try to use insulin twice a week. Will increase glipizide from 5 to 10 mg bid.  Follow-up in 1 month.

## 2011-05-28 ENCOUNTER — Encounter: Payer: Self-pay | Admitting: Family Medicine

## 2011-05-28 ENCOUNTER — Ambulatory Visit (INDEPENDENT_AMBULATORY_CARE_PROVIDER_SITE_OTHER): Payer: Medicaid Other | Admitting: Family Medicine

## 2011-05-28 VITALS — BP 154/98 | HR 94 | Temp 98.4°F | Ht 71.0 in | Wt 229.0 lb

## 2011-05-28 DIAGNOSIS — I10 Essential (primary) hypertension: Secondary | ICD-10-CM

## 2011-05-28 DIAGNOSIS — E119 Type 2 diabetes mellitus without complications: Secondary | ICD-10-CM

## 2011-05-28 DIAGNOSIS — Z9114 Patient's other noncompliance with medication regimen: Secondary | ICD-10-CM

## 2011-05-28 DIAGNOSIS — Z9119 Patient's noncompliance with other medical treatment and regimen: Secondary | ICD-10-CM

## 2011-05-28 LAB — POCT GLYCOSYLATED HEMOGLOBIN (HGB A1C): Hemoglobin A1C: >14

## 2011-05-28 MED ORDER — LOSARTAN POTASSIUM-HCTZ 50-12.5 MG PO TABS: 2.0000 | ORAL_TABLET | Freq: Every day | ORAL | Status: DC

## 2011-05-28 NOTE — Patient Instructions (Signed)
For your blood pressure medication, take 2 tablets daily.   Follow-up in 3 months.

## 2011-05-28 NOTE — Progress Notes (Signed)
  Subjective:    Patient ID: [name], male    DOB: Sep 12, 1955, 56 y.o.   MRN: [id number]  HPI Follow-up:  1. Diabetes Takes insulin and glipizide every other day.  Causes itching taking it every day. Does not want to take it every day.  Pain at sites are improved.  Does not check BS at home.  ROS: denies chest pain, headaches, paresthesias, vision changes  2. HTN Takes blood pressure medication every other day.  Does not think he can take it every day.  Does not check BP at home.   Review of Systems Per HPI    Objective:   Physical Exam Gen: NAD Psych: mild MR, appropriate to questions CV: RRR, no m/r/g Pulm: CTAB without w/r/r Skin: no lesions or rashes Ext: no edema    Assessment & Plan:

## 2011-05-29 NOTE — Assessment & Plan Note (Signed)
Remains significant barrier to management of HTN and diabetes.  However, patient does do a good job of making his clinic visits.  Follow-up in 3 months for above issues.

## 2011-05-29 NOTE — Assessment & Plan Note (Signed)
Remains poorly compliant. HgbA1c remains >14.0.  Reports increased insulin use to every other day (up from twice a week) and taking glipizide every other day as well.  I have seen him frequently over the past few weeks to encourage compliance but patient unwilling.  Follow-up in 3 months.

## 2011-05-29 NOTE — Assessment & Plan Note (Signed)
Remains elevated, however, not different from usual blood pressures here at clinic.  Compliance remains the most significant barrier.  Continue Hyzaar.  Follow-up in 3 months.  Patient informed of red flags warranting return to clinic/visit to ED.

## 2011-07-24 ENCOUNTER — Telehealth: Payer: Self-pay | Admitting: *Deleted

## 2011-07-24 DIAGNOSIS — E119 Type 2 diabetes mellitus without complications: Secondary | ICD-10-CM

## 2011-07-24 NOTE — Telephone Encounter (Signed)
[facility] [place] calling for refill on Glipizide. They have been unable to fax or send electronically. Will forward request to MD.

## 2011-07-25 MED ORDER — GLIPIZIDE 10 MG PO TABS: 10.0000 mg | ORAL_TABLET | Freq: Two times a day (BID) | ORAL | Status: DC

## 2011-07-25 NOTE — Telephone Encounter (Signed)
Rx sent 

## 2011-08-09 ENCOUNTER — Other Ambulatory Visit: Payer: Self-pay | Admitting: *Deleted

## 2011-08-09 MED ORDER — GLUCOSE BLOOD VI STRP: ORAL_STRIP | Status: DC

## 2011-08-25 ENCOUNTER — Emergency Department (HOSPITAL_COMMUNITY): Payer: No Typology Code available for payment source

## 2011-08-25 ENCOUNTER — Emergency Department (HOSPITAL_COMMUNITY)
Admission: EM | Admit: 2011-08-25 | Discharge: 2011-08-25 | Disposition: A | Discharge status: Home / Self Care | Payer: No Typology Code available for payment source | Attending: Emergency Medicine | Admitting: Emergency Medicine

## 2011-08-25 ENCOUNTER — Encounter (HOSPITAL_COMMUNITY): Payer: Self-pay | Admitting: *Deleted

## 2011-08-25 DIAGNOSIS — R51 Headache: Secondary | ICD-10-CM | POA: Insufficient documentation

## 2011-08-25 DIAGNOSIS — R142 Eructation: Secondary | ICD-10-CM | POA: Insufficient documentation

## 2011-08-25 DIAGNOSIS — E119 Type 2 diabetes mellitus without complications: Secondary | ICD-10-CM | POA: Insufficient documentation

## 2011-08-25 DIAGNOSIS — R141 Gas pain: Secondary | ICD-10-CM | POA: Insufficient documentation

## 2011-08-25 DIAGNOSIS — T148XXA Other injury of unspecified body region, initial encounter: Secondary | ICD-10-CM

## 2011-08-25 DIAGNOSIS — Z79899 Other long term (current) drug therapy: Secondary | ICD-10-CM | POA: Insufficient documentation

## 2011-08-25 DIAGNOSIS — R21 Rash and other nonspecific skin eruption: Secondary | ICD-10-CM | POA: Insufficient documentation

## 2011-08-25 DIAGNOSIS — IMO0002 Reserved for concepts with insufficient information to code with codable children: Secondary | ICD-10-CM | POA: Insufficient documentation

## 2011-08-25 DIAGNOSIS — I10 Essential (primary) hypertension: Secondary | ICD-10-CM | POA: Insufficient documentation

## 2011-08-25 DIAGNOSIS — Z794 Long term (current) use of insulin: Secondary | ICD-10-CM | POA: Insufficient documentation

## 2011-08-25 HISTORY — DX: Essential (primary) hypertension: I10

## 2011-08-25 LAB — CBC
HCT: 39.1 % (ref 39.0–52.0)
Hemoglobin: 14 g/dL (ref 13.0–17.0)
MCH: 27.7 pg (ref 26.0–34.0)
MCHC: 35.8 g/dL (ref 30.0–36.0)
MCV: 77.4 fL — ABNORMAL LOW (ref 78.0–100.0)
Platelets: 314 10*3/uL (ref 150–400)
RBC: 5.05 MIL/uL (ref 4.22–5.81)
RDW: 12.4 % (ref 11.5–15.5)
WBC: 9.7 10*3/uL (ref 4.0–10.5)

## 2011-08-25 LAB — POCT I-STAT, CHEM 8
BUN: 9 mg/dL (ref 6–23)
Calcium, Ion: 1.22 mmol/L (ref 1.12–1.32)
Chloride: 96 mEq/L (ref 96–112)
Creatinine, Ser: 0.9 mg/dL (ref 0.50–1.35)
Glucose, Bld: 356 mg/dL — ABNORMAL HIGH (ref 70–99)
HCT: 43 % (ref 39.0–52.0)
Hemoglobin: 14.6 g/dL (ref 13.0–17.0)
Potassium: 3.5 mEq/L (ref 3.5–5.1)
Sodium: 138 mEq/L (ref 135–145)
TCO2: 27 mmol/L (ref 0–100)

## 2011-08-25 LAB — DIFFERENTIAL
Basophils Absolute: 0 10*3/uL (ref 0.0–0.1)
Basophils Relative: 0 % (ref 0–1)
Eosinophils Absolute: 0.1 10*3/uL (ref 0.0–0.7)
Eosinophils Relative: 1 % (ref 0–5)
Lymphocytes Relative: 35 % (ref 12–46)
Lymphs Abs: 3.4 10*3/uL (ref 0.7–4.0)
Monocytes Absolute: 0.7 10*3/uL (ref 0.1–1.0)
Monocytes Relative: 7 % (ref 3–12)
Neutro Abs: 5.6 10*3/uL (ref 1.7–7.7)
Neutrophils Relative %: 57 % (ref 43–77)

## 2011-08-25 LAB — GLUCOSE, CAPILLARY: Glucose-Capillary: 335 mg/dL — ABNORMAL HIGH (ref 70–99)

## 2011-08-25 LAB — LACTIC ACID, PLASMA: Lactic Acid, Venous: 3 mmol/L — ABNORMAL HIGH (ref 0.5–2.2)

## 2011-08-25 MED ORDER — TETANUS-DIPHTH-ACELL PERTUSSIS 5-2.5-18.5 LF-MCG/0.5 IM SUSP
0.5000 mL | Freq: Once | INTRAMUSCULAR | Status: AC
  Administered 2011-08-25: 0.5 mL via INTRAMUSCULAR
  Filled 2011-08-25: qty 0.5

## 2011-08-25 MED ORDER — HYDROCODONE-ACETAMINOPHEN 5-500 MG PO TABS: 1.0000 | ORAL_TABLET | Freq: Four times a day (QID) | ORAL | Status: AC | PRN

## 2011-08-25 MED ORDER — IOHEXOL 300 MG/ML  SOLN
100.0000 mL | Freq: Once | INTRAMUSCULAR | Status: AC | PRN
  Administered 2011-08-25: 100 mL via INTRAVENOUS

## 2011-08-25 MED ORDER — HYDROMORPHONE HCL PF 1 MG/ML IJ SOLN
1.0000 mg | Freq: Once | INTRAMUSCULAR | Status: AC
  Administered 2011-08-25: 1 mg via INTRAVENOUS
  Filled 2011-08-25: qty 1
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
## 2011-08-25 NOTE — Discharge Instructions (Signed)
Abrasions Abrasions are skin scrapes. Their treatment depends on how large and deep the abrasion is. Abrasions do not extend through all layers of the skin. A cut or lesion through all skin layers is called a laceration. HOME CARE INSTRUCTIONS   If you were given a dressing, change it at least once a day or as instructed by your caregiver. If the bandage sticks, soak it off with a solution of water or hydrogen peroxide.   Twice a day, wash the area with soap and water to remove all the cream/ointment. You may do this in a sink, under a tub faucet, or in a shower. Rinse off the soap and pat dry with a clean towel. Look for signs of infection (see below).   Reapply cream/ointment according to your caregiver's instruction. This will help prevent infection and keep the bandage from sticking. Telfa or gauze over the wound and under the dressing or wrap will also help keep the bandage from sticking.   If the bandage becomes wet, dirty, or develops a foul smell, change it as soon as possible.   Only take over-the-counter or prescription medicines for pain, discomfort, or fever as directed by your caregiver.  SEEK IMMEDIATE MEDICAL CARE IF:   Increasing pain in the wound.   Signs of infection develop: redness, swelling, surrounding area is tender to touch, or pus coming from the wound.   You have a fever.   Any foul smell coming from the wound or dressing.  Most skin wounds heal within ten days. Facial wounds heal faster. However, an infection may occur despite proper treatment. You should have the wound checked for signs of infection within 24 to 48 hours or sooner if problems arise. If you were not given a wound-check appointment, look closely at the wound yourself on the second day for early signs of infection listed above. MAKE SURE YOU:   Understand these instructions.   Will watch your condition.   Will get help right away if you are not doing well or get worse.  Document Released:  11/29/2004 Document Revised: 02/08/2011 Document Reviewed: 01/23/2011 [facility] [employer] Patient Information 2012 [place], [place].Abrasions An abrasion is a scraped area on the skin. Abrasions do not go through all layers of the skin.  HOME CARE  Change any bandages (dressings) as told by your doctor. If the bandage sticks, soak it off with warm, soapy water. Change the bandage if it gets wet, dirty, or starts to smell.   Wash the area with soap and water twice a day. Rinse off the soap. Pat the area dry with a clean towel.   Look at the injured area for signs of infection. Infection signs include redness, puffiness (swelling), tenderness, or yellowish white fluid (pus) coming from the wound.   Apply medicated cream as told by your doctor.   Only take medicine as told by your doctor.   Follow up with your doctor as told.  GET HELP RIGHT AWAY IF:   You have more pain in your wound.   You have redness, puffiness (swelling), or tenderness around your wound.   You have yellowish white fluid (pus) coming from your wound.   You have a fever.   A bad smell is coming from the wound or bandage.  MAKE SURE YOU:   Understand these instructions.   Will watch your condition.   Will get help right away if you are not doing well or get worse.  Document Released: 08/08/2007 Document Revised: 02/08/2011 Document Reviewed: 01/23/2011 [employer]  Patient Information 2012 [employer]. 

## 2011-08-25 NOTE — ED Notes (Signed)
Dr. Noe Gens at bedside to evaluate pt. Orders for pain meds pending.
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
## 2011-08-25 NOTE — ED Notes (Signed)
Caporossi, MD notified of abnormal lab test results

## 2011-08-25 NOTE — ED Provider Notes (Signed)
I saw and evaluated the patient, reviewed the resident's note and I agree with the findings and plan.  Cheri Guppy, MD 08/25/11 [phone]

## 2011-08-25 NOTE — ED Notes (Signed)
Resident at bedside to discuss plan of care.

## 2011-08-25 NOTE — ED Notes (Signed)
Patient is AOx4 and comfortable with his discharge instructions. 
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
## 2011-08-25 NOTE — ED Notes (Signed)
Dermabond at bedside for EDP 
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
## 2011-08-28 ENCOUNTER — Encounter: Payer: Self-pay | Admitting: Family Medicine

## 2011-09-19 ENCOUNTER — Ambulatory Visit (INDEPENDENT_AMBULATORY_CARE_PROVIDER_SITE_OTHER): Payer: Medicaid Other | Admitting: Family Medicine

## 2011-09-19 VITALS — BP 132/81 | HR 111 | Temp 98.6°F | Ht 73.0 in | Wt 224.0 lb

## 2011-09-19 DIAGNOSIS — E119 Type 2 diabetes mellitus without complications: Secondary | ICD-10-CM

## 2011-09-19 DIAGNOSIS — I10 Essential (primary) hypertension: Secondary | ICD-10-CM

## 2011-09-19 LAB — POCT GLYCOSYLATED HEMOGLOBIN (HGB A1C): Hemoglobin A1C: >14

## 2011-09-19 NOTE — Assessment & Plan Note (Addendum)
Unchanged.  HgbA1c still >14.0. Patient not willing/motivated to comply with recommended glipizide and insulin therapy. Metformin has been discontinued since he was not taking anyway.  Will refer to Dr. Raymondo Band clinical pharmacist to see if he has any further recommendations.  Patient expressed awareness that he has significant mortality risk due to his poorly controlled diabetes. Despite his mild MR, I believe he understands this.  Of note, at least his LDL several years ago was under control in the 70s. May need to re-check this now if it would be helpful/constructive to know his cholesterol.
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
## 2011-09-19 NOTE — Patient Instructions (Addendum)
Please make an appointment to see our pharmacist Dr. Raymondo Band in 2 weeks.   Follow-up with Dr. Madolyn Frieze in 3 months.

## 2011-09-22 NOTE — Assessment & Plan Note (Signed)
Controlled. He is compliant with BP medications.

## 2011-10-05 ENCOUNTER — Ambulatory Visit (INDEPENDENT_AMBULATORY_CARE_PROVIDER_SITE_OTHER): Payer: Medicaid Other | Admitting: Pharmacist

## 2011-10-05 ENCOUNTER — Encounter: Payer: Self-pay | Admitting: Pharmacist

## 2011-10-05 VITALS — BP 148/92 | HR 108 | Ht 73.0 in | Wt 228.7 lb

## 2011-10-05 DIAGNOSIS — E119 Type 2 diabetes mellitus without complications: Secondary | ICD-10-CM

## 2011-10-05 MED ORDER — INSULIN GLARGINE 100 UNIT/ML ~~LOC~~ SOLN: 40.0000 [IU] | Freq: Every day | SUBCUTANEOUS | Status: DC

## 2011-10-05 NOTE — Patient Instructions (Addendum)
Change Lantus to 40 units at night.  Check sugar levels after running a few times before next visit.

## 2011-10-05 NOTE — Assessment & Plan Note (Signed)
Diabetes of many yrs duration currently under poor control of blood glucose based on. Lab Results  Component Value Date   HGBA1C >14.0 09/19/2011  Home meter information was downloaded showing fasting CBG readings with average of 407, high of 549, and low of 246 over the last month. Control is suboptimal due to higher insulin requirements. Denies hypoglycemic events. Patient was asked to monitor his CBG after his runs to ensure he does not become hypoglycemic.  Able to verbalize appropriate hypoglycemia management plan. Increased dose of basal insulin Lantus to 40 units at bedtime(insulin glargine). Written patient instructions provided.  Follow up in  Pharmacist Clinic Visit Aug 15th. Total time in face to face counseling 20 minutes.  Patient seen with [name], PharmD Candidate and Drue Stager, PharmD, Pharmacy Resident.
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
## 2011-10-05 NOTE — Progress Notes (Signed)
Patient ID: [name], male   DOB: 07/17/1955, 56 y.o.   MRN: [id number] Reviewed and agree with Dr. Macky Lower documentation and management.

## 2011-10-18 ENCOUNTER — Ambulatory Visit: Payer: Medicaid Other | Admitting: Pharmacist

## 2011-11-02 ENCOUNTER — Other Ambulatory Visit: Payer: Self-pay | Admitting: Family Medicine

## 2011-12-06 ENCOUNTER — Other Ambulatory Visit: Payer: Self-pay | Admitting: *Deleted

## 2011-12-06 MED ORDER — GLIPIZIDE 10 MG PO TABS: 20.0000 mg | ORAL_TABLET | Freq: Every day | ORAL | Status: DC

## 2012-01-07 ENCOUNTER — Ambulatory Visit (INDEPENDENT_AMBULATORY_CARE_PROVIDER_SITE_OTHER): Payer: Medicaid Other | Admitting: Family Medicine

## 2012-01-07 ENCOUNTER — Encounter: Payer: Self-pay | Admitting: Family Medicine

## 2012-01-07 VITALS — BP 179/96 | HR 74 | Temp 98.2°F | Ht 73.0 in | Wt 239.0 lb

## 2012-01-07 DIAGNOSIS — I10 Essential (primary) hypertension: Secondary | ICD-10-CM

## 2012-01-07 DIAGNOSIS — E119 Type 2 diabetes mellitus without complications: Secondary | ICD-10-CM

## 2012-01-07 LAB — POCT GLYCOSYLATED HEMOGLOBIN (HGB A1C): Hemoglobin A1C: >14

## 2012-01-07 NOTE — Assessment & Plan Note (Signed)
Remains uncontrolled.  We will experiment. He will try taking insulin every other day (odd days) and will continue to check his sugars every day for 2 weeks. We will follow-up in 2 weeks to see if he feels any better or if his sugars are impacted by his taking insulin.

## 2012-01-07 NOTE — Patient Instructions (Addendum)
Take blood pressure medicine every day for 2 weeks Take insulin every other day for 2 weeks on the odd days (so starting tomorrow November 5th). Let's see if it makes a difference in your sugars.   Follow-up in 2 weeks

## 2012-01-07 NOTE — Progress Notes (Signed)
  Subjective:    Patient ID: [name], male    DOB: [birth year], 56 y.o.   MRN: [id number]  HPI # Diabetes, uncontrolled He takes his insulin 40 units every 2-3 days He does not like injecting himself and does not feel like taking insulin makes any difference in his sugars He checks his sugars once a day in the morning. He brought his meter with him and usual range 250-420. One abnormal low 93 and one abnormal high 585 but did have a few values upper 400s.  Endorses occasional blurred vision and feeling fatigued. Denies chest pain.   # Hypertension, uncontrolled due to medical non-compliance He takes Hyzaar every 2-3 days as well. He says it makes him feel fatigued. He last took it yesterday morning.  ROS: denies headache, chest pain/palpitations  Review of Systems Per HPI  Allergies, medication, past medical history reviewed.  Obesity Significant medication non-compliance    Objective:   Physical Exam GEN: NAD; overweight CV: RRR, normal S1/S2, no murmurs PULM: NI WOB; CTAB EXT: no edema NEURO: grossly intact without focal deficits; normal gait      Assessment & Plan:

## 2012-01-07 NOTE — Assessment & Plan Note (Signed)
We will experiment and try taking daily for 2 weeks to see how he feels and if his fatigue improves or worsens with daily anti-hypertensive.

## 2012-01-14 ENCOUNTER — Encounter: Payer: Self-pay | Admitting: Home Health Services

## 2012-01-15 ENCOUNTER — Encounter: Payer: Self-pay | Admitting: Home Health Services

## 2012-01-23 ENCOUNTER — Ambulatory Visit (INDEPENDENT_AMBULATORY_CARE_PROVIDER_SITE_OTHER): Payer: Medicaid Other | Admitting: Family Medicine

## 2012-01-23 ENCOUNTER — Encounter: Payer: Self-pay | Admitting: Family Medicine

## 2012-01-23 VITALS — BP 159/87 | HR 94 | Temp 98.1°F | Ht 73.0 in | Wt 236.0 lb

## 2012-01-23 DIAGNOSIS — E1165 Type 2 diabetes mellitus with hyperglycemia: Secondary | ICD-10-CM

## 2012-01-23 DIAGNOSIS — I10 Essential (primary) hypertension: Secondary | ICD-10-CM

## 2012-01-23 DIAGNOSIS — IMO0001 Reserved for inherently not codable concepts without codable children: Secondary | ICD-10-CM

## 2012-01-23 DIAGNOSIS — Z9119 Patient's noncompliance with other medical treatment and regimen: Secondary | ICD-10-CM

## 2012-01-23 DIAGNOSIS — Z91148 Patient's other noncompliance with medication regimen for other reason: Secondary | ICD-10-CM

## 2012-01-23 DIAGNOSIS — Z9114 Patient's other noncompliance with medication regimen: Secondary | ICD-10-CM

## 2012-01-23 DIAGNOSIS — IMO0002 Reserved for concepts with insufficient information to code with codable children: Secondary | ICD-10-CM

## 2012-01-23 NOTE — Progress Notes (Signed)
  Subjective:    Patient ID: [name], male    DOB: [birth year], 56 y.o.   MRN: [id number]  HPI # Hypertension He has been taking blood pressure medication 5/7 days He reports feeling less tired while on blood pressure medication when asked, however, he also says that he last took medication yesterday and only 1/2 tablet because it made hi tired  # Diabetes, insulin-dependent He has not been using on odd-days He uses when he feels like it  # Medication non-compliance When asked about this, he does not have clear answer We delved a little into potential depression. He thinks he may be depressed. When asked, he could not tell me what he looked forward to. But he did say that he likes living and denies SI. He thinks he will watch television on Thanksgiving; his sister lives nearby.   Review of Systems Per HPI  Allergies, medication, past medical history reviewed.      Objective:   Physical Exam GEN: NAD CV: RRR, normal S1/S2, no murmurs PULM: NI WOB EXT: no edema PSYCH: depressed-appearing; speaks slowly and seems to process information more slowly than normal (?mild MR); appropriate to questions      Assessment & Plan:

## 2012-01-23 NOTE — Assessment & Plan Note (Signed)
Continue current medications. 

## 2012-01-23 NOTE — Assessment & Plan Note (Signed)
Takes insulin sporadically so diabetes remains poorly controlled PLAN: Continue current medications. We have tried multiple interventions, but patient does not seem interested

## 2012-01-23 NOTE — Patient Instructions (Addendum)
Follow-up in 6 months for your blood pressure and diabetes  Happy holidays [name]. Thank you for coming in today.

## 2012-01-23 NOTE — Assessment & Plan Note (Addendum)
He is not interested in having better control of his chronic conditions When asked potential risks for poor control, he seems to understand at least verbally consequences Depression may be contributing, however, patient not interested in therapy, medications, or other treatment modalities and did not seem interested in having this pursued

## 2012-02-01 ENCOUNTER — Other Ambulatory Visit: Payer: Self-pay | Admitting: Family Medicine

## 2012-04-07 ENCOUNTER — Other Ambulatory Visit: Payer: Self-pay | Admitting: Family Medicine

## 2012-04-25 ENCOUNTER — Ambulatory Visit (INDEPENDENT_AMBULATORY_CARE_PROVIDER_SITE_OTHER): Payer: Medicaid Other | Admitting: Family Medicine

## 2012-04-25 ENCOUNTER — Encounter: Payer: Self-pay | Admitting: Family Medicine

## 2012-04-25 VITALS — BP 170/105 | HR 94 | Ht 73.0 in | Wt 236.0 lb

## 2012-04-25 DIAGNOSIS — H5789 Other specified disorders of eye and adnexa: Secondary | ICD-10-CM

## 2012-04-25 NOTE — Progress Notes (Signed)
  Subjective:    Patient ID: [name], male    DOB: 1956/01/12, 57 y.o.   MRN: [id number]  HPI 57 y/o Male here with 3 weeks of blurred vision. States that he had no trauma and that when he woke up it was slightly painful and ghis vision was blurry. His vision has gotten increasingly blurry since then and the pain has subsided. He denies discharge, trauma, fever, sick contacts. He does have occasional FBS. He also denies photophobia  He stopped taking his blood pressure medicine 2 weeks ago because eye pain and blindness were one of the side effects.   He denies chest pain, dyspnea, fevers, chills, sweats, nausea, vomiting and diarrhea. Review of Systems Per hpi    Objective:   Physical Exam  Gen: NAD, alert, cooperative with exam HEENT: NCAT, EOMI,   L eye: Erethemetous conjunctiva, Cornea appears cloudy as well as the anterior chamber. No pain to palpation, Pupil fixed on th eL.   Visual Acuity: not able to discriminate fingers in front of L eye.     Assessment & Plan:

## 2012-04-25 NOTE — Patient Instructions (Signed)
Thanks for coming in today.   You are having an emergency with your eye. I have spoken with your eye doctor and they have told me to send you to the ER.

## 2012-04-25 NOTE — Assessment & Plan Note (Addendum)
With worrisome features of decreased to no visual acuity, fixed pupil, and cloudy cornea/anterior chamber this is an ophthalmologic urgency to emergency.  Contacted his previous ophthalmologist's office, [facility], who initially advised to send to the ED because there was no physican present. On repeat call we were able to discuss this with a physician there and he agrees to see him today.   Sent him to [facility] center via taxi He had stopped taking BP meds b/c of unilateral eye problem, I advised him to restart.

## 2012-05-21 ENCOUNTER — Encounter: Payer: Self-pay | Admitting: *Deleted

## 2012-08-11 ENCOUNTER — Other Ambulatory Visit: Payer: Self-pay | Admitting: *Deleted

## 2012-08-11 MED ORDER — GLIPIZIDE 10 MG PO TABS: 10.0000 mg | ORAL_TABLET | Freq: Two times a day (BID) | ORAL | Status: DC

## 2012-08-11 NOTE — Telephone Encounter (Signed)
Requested Prescriptions   Pending Prescriptions Disp Refills  . glipiZIDE (GLUCOTROL) 10 MG tablet 30 tablet 3   Wyatt Haste, RN-BSN

## 2012-08-18 ENCOUNTER — Other Ambulatory Visit: Payer: Self-pay | Admitting: *Deleted

## 2012-08-18 MED ORDER — LOSARTAN POTASSIUM-HCTZ 50-12.5 MG PO TABS: 1.0000 | ORAL_TABLET | Freq: Every day | ORAL | Status: DC

## 2012-09-11 ENCOUNTER — Other Ambulatory Visit: Payer: Self-pay

## 2012-10-24 ENCOUNTER — Other Ambulatory Visit: Payer: Self-pay | Admitting: Family Medicine

## 2012-11-24 ENCOUNTER — Encounter: Payer: Self-pay | Admitting: Family Medicine

## 2012-11-24 ENCOUNTER — Ambulatory Visit (INDEPENDENT_AMBULATORY_CARE_PROVIDER_SITE_OTHER): Payer: Medicaid Other | Admitting: Family Medicine

## 2012-11-24 VITALS — BP 140/102 | HR 87 | Temp 99.3°F | Resp 16 | Ht 72.0 in | Wt 222.0 lb

## 2012-11-24 DIAGNOSIS — E1165 Type 2 diabetes mellitus with hyperglycemia: Secondary | ICD-10-CM

## 2012-11-24 DIAGNOSIS — IMO0001 Reserved for inherently not codable concepts without codable children: Secondary | ICD-10-CM

## 2012-11-24 LAB — POCT GLYCOSYLATED HEMOGLOBIN (HGB A1C): Hemoglobin A1C: >14

## 2012-11-24 MED ORDER — METFORMIN HCL 500 MG PO TABS: 500.0000 mg | ORAL_TABLET | Freq: Two times a day (BID) | ORAL | Status: DC

## 2012-11-24 NOTE — Assessment & Plan Note (Addendum)
Poor compliance. Takes 40U lantus and glipizide at most 2 times a week. CBGs normally in 400s in the morning.  Plan: -Encouraged daily use of insulin, keep log of CBGs, try to avoid cookies or switch to sugar free -Schedule appointments with pharm clinic and nutritionist -Start metformin again today (unclear why he was stopped).

## 2012-11-24 NOTE — Patient Instructions (Signed)
It was nice to meet you today.  There are two things we need to work on: decreasing the amount of sugar you eat (ie sugar-free cookies) and taking your medications as prescribed, every day. I am adding a medication you have been on before: Metformin 500mg  once a day. Continue to take your blood sugar each morning and write this number down with the date so that we can get a better idea of where your sugars are.  I would like you to have a visit with your nutritionist and pharmacist. I would also like you to return for a fasting lipid panel since this has not been done for several years.

## 2012-11-25 NOTE — Progress Notes (Signed)
  Subjective:    Patient ID: [name], male    DOB: [birth year], 56 y.o.   MRN: [id number]  HPI  # Diabetes mellitus - Long history of A1c > 14 - Poorly compliant with insulin and glipizide, says he uses it at most 2 times a week - Poor diet: endorses eating a lot of cookies - Measures sugar in the morning, runs in 400s usually ROS: no CP, SOB, endorses increased thirst and frequent urination  # Left red eye - Sees ophthalmologist, last visit 1 month ago - Doesn't know what exactly he has, but is on eye drops likely for glaucoma - Currently vision normal, says optho doctor told him he may lose vision in the eye  Review of Systems As above    Objective:   Physical Exam BP 140/102  Pulse 87  Temp(Src) 99.3 F (37.4 C) (Oral)  Resp 16  Ht 6' (1.829 m)  Wt 222 lb (100.699 kg)  BMI 30.1 kg/m2  SpO2 99%  HEENT: PERRL, EOMI, left sclera red CV: RRR, normal S1/S2, no murmurs Resp: CTAB, effort normal Extremities:  2+ PT pulses bilaterally Diabetic foot exam: decreased sensation right > left.     Assessment & Plan:  See Problem List documentation

## 2013-01-26 ENCOUNTER — Other Ambulatory Visit: Payer: Self-pay | Admitting: Family Medicine

## 2013-01-26 DIAGNOSIS — IMO0001 Reserved for inherently not codable concepts without codable children: Secondary | ICD-10-CM

## 2013-01-26 MED ORDER — GLIPIZIDE 10 MG PO TABS: 10.0000 mg | ORAL_TABLET | Freq: Two times a day (BID) | ORAL | Status: DC

## 2013-02-02 ENCOUNTER — Other Ambulatory Visit: Payer: Self-pay | Admitting: Family Medicine

## 2013-04-23 ENCOUNTER — Telehealth: Payer: Self-pay | Admitting: *Deleted

## 2013-04-23 NOTE — Telephone Encounter (Signed)
LMOVM for pt to return call.  Please advise that since he is diabetic he is due for a cholesterol check.  Please schedule him a  fasting lab visit.  Future order placed. Fleeger, Salome Spotted

## 2013-05-15 ENCOUNTER — Ambulatory Visit (INDEPENDENT_AMBULATORY_CARE_PROVIDER_SITE_OTHER): Payer: Medicaid Other | Admitting: Family Medicine

## 2013-05-15 ENCOUNTER — Encounter: Payer: Self-pay | Admitting: Family Medicine

## 2013-05-15 VITALS — BP 170/111 | HR 94 | Temp 97.8°F | Ht 72.0 in | Wt 212.3 lb

## 2013-05-15 DIAGNOSIS — IMO0002 Reserved for concepts with insufficient information to code with codable children: Secondary | ICD-10-CM

## 2013-05-15 DIAGNOSIS — E1165 Type 2 diabetes mellitus with hyperglycemia: Secondary | ICD-10-CM

## 2013-05-15 DIAGNOSIS — I10 Essential (primary) hypertension: Secondary | ICD-10-CM

## 2013-05-15 DIAGNOSIS — IMO0001 Reserved for inherently not codable concepts without codable children: Secondary | ICD-10-CM

## 2013-05-15 LAB — POCT GLYCOSYLATED HEMOGLOBIN (HGB A1C): Hemoglobin A1C: >14

## 2013-05-15 MED ORDER — HYDROCHLOROTHIAZIDE 25 MG PO TABS: 25.0000 mg | ORAL_TABLET | Freq: Every day | ORAL | Status: DC

## 2013-05-15 MED ORDER — INSULIN GLARGINE 100 UNIT/ML ~~LOC~~ SOLN: 60.0000 [IU] | Freq: Every day | SUBCUTANEOUS | Status: DC

## 2013-05-15 MED ORDER — AMLODIPINE BESYLATE 5 MG PO TABS: 5.0000 mg | ORAL_TABLET | Freq: Every day | ORAL | Status: DC

## 2013-05-15 NOTE — Assessment & Plan Note (Signed)
Ran out of losartan-hctz 2 weeks ago. He asks to change the medications because he thinks they make him feel bad. P: change to amlodipine 5mg , HCTZ 25mg , f/u in 2 weeks

## 2013-05-15 NOTE — Progress Notes (Signed)
Patient ID: [name], male   DOB: [birth year], 58 y.o.   MRN: [id number]   Subjective:    Patient ID: [name], male    DOB: [birth year], 58 y.o.   MRN: [id number]  HPI  CC: follow up, med refills  # Diabetes:  Continues to eat pasta, breads, rice, cookies. He acknowledges that this is not what he should be doing, he knows he should be eating more vegetables (he buys canned vegetables but doesn't eat them).  Takes medications at most 3 times a week (every other day), he says he doesn't feel good when he takes them. When he does take them he says he uses all 3: lantus, metformin, glipizide. He does not state what exact side effects he is feeling, only that he feels "bad"  Checks sugars sporadically, I looked at his glucometer and in March has only measured 4 times, going back to the beginning of February all of his sugars have been 300+, multiple in 500s  He is able to tell me some of the longterm complications of diabetes (stroke, vision problems which he already has)  On a confidence scale 0-10, when asked if he could record in a log his sugars and take his lantus every day for the next 2 weeks he gives an 8. ROS: no CP, no SOB, endorses blurry vision but hasn't gotten any worse, no abdominal pain or nausea/vomiting  # Hypertension  He ran out of his BP meds 2 weeks ago  He does not want to continue taking his current medications, again states he feels bad with them but doesn't give any specific symptoms ROS: no headache, CP, SOB  Review of Systems   See HPI for ROS. Objective:  BP 170/111  Pulse 94  Temp(Src) 97.8 F (36.6 C) (Oral)  Ht 6' (1.829 m)  Wt 212 lb 4.8 oz (96.299 kg)  BMI 28.79 kg/m2  General: NAD, very quiet when speaking, makes appropriate eye contact Cardiac: RRR, normal s1/s2, no murmurs. 2+ radial pulses, 1+ PT pulses Respiratory: CTAB, normal effort Extremities: no edema or cyanosis. Cool arms on both sides.     Assessment & Plan:  See  Problem List Documentation

## 2013-05-15 NOTE — Patient Instructions (Signed)
Changed your blood pressure medications: Amlodipine 5mg , take once a day Hydrochlorothiazide 25mg , take once a day  Diabetes: Take your lantus insulin 60 units a day, EVERY DAY! Record your blood sugar in the morning and if you remember at night. Take your metformin and glipizide, if you want to stop taking these for the next 2 weeks to see how you feel that is okay.  Please record your blood sugars and when you take your diabetes medications in the papers provided to you, bring them with you to your next appointment.

## 2013-05-15 NOTE — Assessment & Plan Note (Signed)
A1c >14 again today (looks like the last time it was less than 14 was in 2010). Pt is mildly compliant with medications (takes about half the time per report today), poorly compliant with diet. Discussed with him that he is probably past the point of using oral medications and will likely need to be on increased insulin. P: Increase lantus to 60 units daily, check fasting sugar each day for next 2 weeks and record in daily diary, f/u in 2 weeks. Gave him permission to discontinue metformin and glipizide on a trial basis to see if they were contributing to his noncompliance (states he feels "bad" when taking those meds)

## 2013-05-16 LAB — COMPREHENSIVE METABOLIC PANEL
ALT: 17 U/L (ref 0–53)
AST: 17 U/L (ref 0–37)
Albumin: 4 g/dL (ref 3.5–5.2)
Alkaline Phosphatase: 94 U/L (ref 39–117)
BUN: 10 mg/dL (ref 6–23)
CO2: 29 mEq/L (ref 19–32)
Calcium: 9.7 mg/dL (ref 8.4–10.5)
Chloride: 96 mEq/L (ref 96–112)
Creat: 0.98 mg/dL (ref 0.50–1.35)
Glucose, Bld: 280 mg/dL — ABNORMAL HIGH (ref 70–99)
Potassium: 4 mEq/L (ref 3.5–5.3)
Sodium: 133 mEq/L — ABNORMAL LOW (ref 135–145)
Total Bilirubin: 0.7 mg/dL (ref 0.2–1.2)
Total Protein: 6.9 g/dL (ref 6.0–8.3)

## 2013-05-16 LAB — LIPID PANEL
Cholesterol: 166 mg/dL (ref 0–200)
HDL: 48 mg/dL (ref >39)
LDL Cholesterol: 97 mg/dL (ref 0–99)
Total CHOL/HDL Ratio: 3.5 Ratio
Triglycerides: 106 mg/dL (ref <150)
VLDL: 21 mg/dL (ref 0–40)

## 2013-06-08 ENCOUNTER — Encounter: Payer: Self-pay | Admitting: Family Medicine

## 2013-06-08 ENCOUNTER — Ambulatory Visit (INDEPENDENT_AMBULATORY_CARE_PROVIDER_SITE_OTHER): Payer: Medicaid Other | Admitting: Family Medicine

## 2013-06-08 VITALS — BP 168/110 | HR 97 | Temp 98.1°F | Ht 72.0 in | Wt 220.0 lb

## 2013-06-08 DIAGNOSIS — IMO0001 Reserved for inherently not codable concepts without codable children: Secondary | ICD-10-CM

## 2013-06-08 DIAGNOSIS — I10 Essential (primary) hypertension: Secondary | ICD-10-CM

## 2013-06-08 DIAGNOSIS — E1165 Type 2 diabetes mellitus with hyperglycemia: Secondary | ICD-10-CM

## 2013-06-08 DIAGNOSIS — IMO0002 Reserved for concepts with insufficient information to code with codable children: Secondary | ICD-10-CM

## 2013-06-08 MED ORDER — LISINOPRIL 10 MG PO TABS: 10.0000 mg | ORAL_TABLET | Freq: Every day | ORAL | Status: DC

## 2013-06-08 MED ORDER — AMLODIPINE BESYLATE 10 MG PO TABS: 10.0000 mg | ORAL_TABLET | Freq: Every day | ORAL | Status: DC

## 2013-06-08 NOTE — Patient Instructions (Addendum)
Very good job taking your lantus and recording your sugars on the sheets for me.  Start taking your lantus insulin in the morning. The lantus dose does not need to be changed based on your sugar level, but if you start to have consistent low blood sugars then we will change this. At your next visit we will discuss adding a sliding scale insulin that you will take based on your blood sugar 3 times a day.  For your blood pressure, increase your Amlodipine to 10mg  a day. Start taking Lisinopril 10mg  a day. If you tolerate this medication, in the future we will switch to a combination pill of lisinopril and hydrochlorothiazide. The lisinopril can cause a dry cough or swelling of your legs and face. If you notice these symptoms please STOP taking it and call the clinic.

## 2013-06-08 NOTE — Assessment & Plan Note (Signed)
BP still not controlled P: amlodipine to 10mg , add lisinopril 10mg , continue HCTZ 25mg 

## 2013-06-08 NOTE — Assessment & Plan Note (Signed)
Seems to be compliant with lantus regimen (though changing the dose on his own), discussed with him lantus is a basal insulin that he should take the same every day, and if his sugars get consistently low then to change that.  P: change lantus to morning dose. Continue logging sugars, f/u in 1 month and will likely start sliding scale insulin.

## 2013-06-08 NOTE — Progress Notes (Signed)
Patient ID: [name], male   DOB: 06/24/1955, 58 y.o.   MRN: [id number]   Subjective:    Patient ID: [name], male    DOB: Mar 24, 1955, 58 y.o.   MRN: [id number]  HPI  CC: Diabetes  # DM:  Taking lantus at night only. Stopped taking glipizide and metformin as he wasn't taking them because he thought they made him feel bad  Has been recording sugars twice a day. He has been changing his lantus dose based on his numbers/how he felt. Several days he was only taking 20 units, max dose was 50 units (I asked him to increase to 60)  Sugars: High 3/18 PM 589, low 3/24 PM 91 and 4/2 PM 78. Range over past 1 week 78-434. Past few weeks mostly 200-300s.  Still eating many sweets (cookies).  Recent visit with eye doctor 4/2.  Scale 0-10 for how confident starting sliding scale insulin: 9 ROS: denies recent changes in vision, CP, SOB, changes in urination  # Hypertension  Taking amlodipine 5mg  and hctz 25mg  as directed (recently stopped losartan)  Denies missing any doses, took this morning ROS: no CP, no SOB  Review of Systems   See HPI for ROS. Objective:  BP 168/110  Pulse 97  Temp(Src) 98.1 F (36.7 C) (Oral)  Ht 6' (1.829 m)  Wt 220 lb (99.791 kg)  BMI 29.83 kg/m2  General: NAD Cardiac: RRR, normal heart sounds, no murmurs. 2+ radial and PT pulses bilaterally Respiratory: CTAB, normal effort Abdomen: soft, nontender, nondistended, no hepatic or splenomegaly. Bowel sounds present Extremities: no edema or cyanosis. WWP. Skin: warm and dry, no rashes noted Neuro: alert and oriented, no focal deficits     Assessment & Plan:  See Problem List Documentation

## 2013-07-07 ENCOUNTER — Ambulatory Visit (INDEPENDENT_AMBULATORY_CARE_PROVIDER_SITE_OTHER): Payer: Medicaid Other | Admitting: Family Medicine

## 2013-07-07 VITALS — BP 127/83 | HR 95 | Temp 98.0°F | Ht 72.0 in | Wt 225.0 lb

## 2013-07-07 DIAGNOSIS — E1165 Type 2 diabetes mellitus with hyperglycemia: Secondary | ICD-10-CM

## 2013-07-07 DIAGNOSIS — IMO0002 Reserved for concepts with insufficient information to code with codable children: Secondary | ICD-10-CM

## 2013-07-07 DIAGNOSIS — I10 Essential (primary) hypertension: Secondary | ICD-10-CM

## 2013-07-07 DIAGNOSIS — IMO0001 Reserved for inherently not codable concepts without codable children: Secondary | ICD-10-CM

## 2013-07-07 MED ORDER — INSULIN GLARGINE 100 UNIT/ML ~~LOC~~ SOLN: 50.0000 [IU] | Freq: Every day | SUBCUTANEOUS | Status: DC

## 2013-07-07 MED ORDER — INSULIN ASPART 100 UNIT/ML ~~LOC~~ SOLN: 5.0000 [IU] | Freq: Three times a day (TID) | SUBCUTANEOUS | Status: DC

## 2013-07-07 NOTE — Assessment & Plan Note (Signed)
More compliant over last 1 month with sugars better controlled but still having average in 200-350 range. Taking 50U lantus only right now.  P: Starting him slowly with medication changes. Start 5 units novolog before meals (patient to check sugars 3x a day, if CBG <150 told to skip taking novolog or if at night, skip his lantus dose). Discussed hypoglycemia symptoms and when to call for medical help. F/u in 1 month to review sugars and adjust (likely increase novolog) as needed; also due for A1c at that time.

## 2013-07-07 NOTE — Assessment & Plan Note (Signed)
Currently only taking lisinopril 10mg  and amlodipine 10mg . He has not been taking HCTZ. His BP is controlled in clinic today, so I agreed he can stop the HCTZ for now. P: continue lisinopril 10mg  and amlodipine 10mg , if BP elevated at next visit add HCTZ back.

## 2013-07-07 NOTE — Progress Notes (Signed)
Patient ID: [name], male   DOB: 1955/09/06, 58 y.o.   MRN: [id number]   Subjective:    Patient ID: [name], male    DOB: Jul 04, 1955, 58 y.o.   MRN: [id number]  HPI  CC: f/u for diabetes and hypertension  # Diabetes:  Brings in log today with twice daily blood sugars over the month of April. His sugars appear better controlled, with only 2 in the 400s and one fasting low of 106. He is more consistent with taking his lantus 50 units, skipping only 4-5 days (because he didn't feel good on those days).  Diet is still not well controlled, he understands he is eating a lot of sweets/sugar and that this is not good for him.  Confidence scale for starting novolog is an "8". ROS: no hypoglycemic symptoms such as fatigue, dizziness, clamminess of skin, tremors  # Hypertension  Started lisinopril 1 month ago. He takes this about "every other day" because he feels it makes him worse  Currently not taking hydrochlorothiazide despite being prescribed and refilled in March ROS: no CP, no SOB, no headache, no dizziness, no changes in vision  Review of Systems   See HPI for ROS. Objective:  BP 127/83  Pulse 95  Temp(Src) 98 F (36.7 C) (Oral)  Ht 6' (1.829 m)  Wt 225 lb (102.059 kg)  BMI 30.51 kg/m2  General: NAD HEENT: Anisocoria (at baseline, left pupil > right), left pupil is less reactive to light than right. (pt states he has a problem with this eye, but can't remember what exactly) Cardiac: RRR, normal heart sounds, no murmurs. 2+ radial and DP pulses bilaterally Respiratory: CTAB, normal effort Extremities: no edema or cyanosis. WWP. Skin: warm and dry, no rashes noted Neuro: alert and oriented. He has quiet speech at baseline.     Assessment & Plan:  See Problem List Documentation

## 2013-07-07 NOTE — Patient Instructions (Addendum)
LANTUS INSULIN: Take 50 units at night. If your sugar at night is less than 150, do not take the lantus.  NOVOLOG INSULIN (MEAL coverage): Take 5 units before each meal after checking your sugar. If your sugar is less than 150, skip the dose. Recheck at the next meal and take if sugar is more than 150  Blood pressure medications: Take the lisinopril and amlodipine EVERY DAY!  Follow up in 1 month

## 2013-08-05 ENCOUNTER — Encounter: Payer: Self-pay | Admitting: Family Medicine

## 2013-08-05 ENCOUNTER — Ambulatory Visit (INDEPENDENT_AMBULATORY_CARE_PROVIDER_SITE_OTHER): Payer: Medicaid Other | Admitting: Family Medicine

## 2013-08-05 VITALS — BP 142/92 | HR 90 | Temp 98.5°F | Ht 72.0 in | Wt 232.0 lb

## 2013-08-05 DIAGNOSIS — E1165 Type 2 diabetes mellitus with hyperglycemia: Secondary | ICD-10-CM

## 2013-08-05 DIAGNOSIS — IMO0002 Reserved for concepts with insufficient information to code with codable children: Secondary | ICD-10-CM

## 2013-08-05 DIAGNOSIS — IMO0001 Reserved for inherently not codable concepts without codable children: Secondary | ICD-10-CM

## 2013-08-05 DIAGNOSIS — H5710 Ocular pain, unspecified eye: Secondary | ICD-10-CM

## 2013-08-05 DIAGNOSIS — H5712 Ocular pain, left eye: Secondary | ICD-10-CM | POA: Insufficient documentation

## 2013-08-05 LAB — POCT GLYCOSYLATED HEMOGLOBIN (HGB A1C): Hemoglobin A1C: 13.3

## 2013-08-05 NOTE — Progress Notes (Signed)
Patient ID: [name], male   DOB: 08-Mar-1955, 58 y.o.   MRN: [id number]   Subjective:    Patient ID: [name], male    DOB: 1956-02-05, 58 y.o.   MRN: [id number]  HPI  CC: diabetes follow up  # Diabetes follow up:  Taking 40 units lantus at night (prescribed 50 units)  Taking only 5 units novolog with breakfast in the morning (prescribed 5 units with each of 3 meals a day)  Blood sugar monitoring: improved, average 200s, 2 checks in 400, 1 check below 100 (77)  Has not noticed any significant change in his energy levels (still thinks the medications are causing him to feel bad on some days) ROS: +fatigue, +CP intermittently sharp on right side of chest (lasts few seconds, non-exertional), no SOB, no abdominal pain  # Sore left eye:  Started last week, but improved today  Outside of left eye feels sore  No significant change in vision  Followed by [facility] opthalmologist  Review of Systems   See HPI for ROS. Objective:  BP 142/92  Pulse 90  Temp(Src) 98.5 F (36.9 C) (Oral)  Ht 6' (1.829 m)  Wt 232 lb (105.235 kg)  BMI 31.46 kg/m2  General: NAD HEENT: PERRL, EOMI, no pain with extremes of . Left sclera with mild injection. Unable to appreciate fundi with non-dilated pupil exam Cardiac: RRR, normal heart sounds, no murmurs. 2+ radial and PT pulses bilaterally Respiratory: CTAB, normal effort Extremities: no edema or cyanosis. WWP. Skin: warm and dry, no rashes noted Neuro: alert and oriented, no focal deficits     Assessment & Plan:  See Problem List Documentation

## 2013-08-05 NOTE — Assessment & Plan Note (Signed)
Last <1 week and currently pain free on exam. Left eye with very mild injection. Fundoscopic exam difficult in office. Suspect he may have a viral conjunctivitis that is resolving. Instructed to be seen by his opthalmologist if this persists for more than 1 more week, or vision/pain becomes acutely worse.

## 2013-08-05 NOTE — Assessment & Plan Note (Signed)
Still working on compliance as patient taking 40 units lantus when asked to take 50, and only doing novolog once a day and not with every meal. Despite this, his A1c is 13.3, the first time less than 14 since 2010. Sugar log appears to be much improved with averages closer to 200s with only a few 400s, 1 low. Patient verbalized understanding he is to record his sugar and take novolog with each meal. F/u in 1 month.

## 2013-08-05 NOTE — Patient Instructions (Signed)
LANTUS INSULIN:  Take 40 units at night. If your sugar at night is less than 125, do not take the lantus.   NOVOLOG INSULIN (MEAL coverage):  Take 5 units before each meal after checking your sugar. If your sugar is less than 125, skip the dose. Recheck at the next meal and take if sugar is more than 125 This means you are taking the Novolog 3 times a day if you are eating 3 meals  Blood pressure medications:  Take the lisinopril and amlodipine EVERY DAY!  Keep up the good work. Continue to record your blood sugars every time you check them.

## 2013-09-03 ENCOUNTER — Ambulatory Visit (INDEPENDENT_AMBULATORY_CARE_PROVIDER_SITE_OTHER): Payer: Medicaid Other | Admitting: Family Medicine

## 2013-09-03 ENCOUNTER — Encounter: Payer: Self-pay | Admitting: Family Medicine

## 2013-09-03 VITALS — BP 130/86 | HR 92 | Temp 98.4°F | Resp 16 | Wt 226.0 lb

## 2013-09-03 DIAGNOSIS — IMO0002 Reserved for concepts with insufficient information to code with codable children: Secondary | ICD-10-CM

## 2013-09-03 DIAGNOSIS — R109 Unspecified abdominal pain: Secondary | ICD-10-CM

## 2013-09-03 DIAGNOSIS — E1165 Type 2 diabetes mellitus with hyperglycemia: Secondary | ICD-10-CM

## 2013-09-03 DIAGNOSIS — IMO0001 Reserved for inherently not codable concepts without codable children: Secondary | ICD-10-CM

## 2013-09-03 DIAGNOSIS — I10 Essential (primary) hypertension: Secondary | ICD-10-CM

## 2013-09-03 NOTE — Progress Notes (Signed)
Patient ID: [name], male   DOB: [birth year], 58 y.o.   MRN: [id number]   Subjective:    Patient ID: [name], male    DOB: 18-Jun-1955, 58 y.o.   MRN: [id number]  HPI  CC: Diabetes, hypertension follow up  # DM:  Taking: lantus 50 units, novolog 5 units in AM. Missed only 2-3 doses since last visit  AM CBG checks: no lows below 100, high of 400, average 180-220  CBG high: 400. Low 90.  Has not noticed any changes in how he feels  Diet: unchanged (still eating sugar foods) ROS:no polyuria, dysuria, hypoglycemic symptoms  # Hypertension  Taking medications as prescribed: amlodipine, hctz, lisinopril  No dizziness/lightheadedness/LOC ROS: no CP, SOB, changes in vision  # Abdominal pain  Located across whole abdomen, level of umbilicus; not in epigastric area  Feels like a burning, deep  No rashes or bruising noted, not in the same locations as his shots ROS: no diarrhea/constipation, no blood in stool  Review of Systems   See HPI for ROS. Objective:  BP 130/86  Pulse 92  Temp(Src) 98.4 F (36.9 C) (Oral)  Resp 16  Wt 226 lb (102.513 kg)  SpO2 97%  General: NAD HEENT: left pupil non-reactive to light (baseline), consenual response of right eye. Cardiac: RRR, normal heart sounds, no murmurs. 2+ radial pulses bilaterally Respiratory: CTAB, normal effort Abdomen: obese, soft, nontender, nondistended, no hepatic or splenomegaly. Bowel sounds present Extremities: no edema or cyanosis. WWP. Skin: warm and dry, no rashes/bruising noted over abdomen Neuro: alert and oriented, no focal deficits     Assessment & Plan:  See Problem List Documentation

## 2013-09-03 NOTE — Patient Instructions (Signed)
Good work with the exercise and insulin!  Changes making today: Add an additional blood sugar check and shot of 5 units novolog before dinner time. Same as before, if your sugar is below 150 at this check do not give the novolog (but still give yourself the lantus that night)  Continue recording your sugars and insulin injections on the log, it is very helpful.

## 2013-09-06 DIAGNOSIS — R109 Unspecified abdominal pain: Secondary | ICD-10-CM | POA: Insufficient documentation

## 2013-09-06 NOTE — Assessment & Plan Note (Signed)
At goal. P: no changes to regimen

## 2013-09-06 NOTE — Assessment & Plan Note (Signed)
Doing better with current regimen. Few high CBG and no lows. P: Add 5 units novolog before dinner and CBG check at that time. Continue daily sugar log. F/u 1 month, get A1c at that visit.

## 2013-09-06 NOTE — Assessment & Plan Note (Signed)
Unclear etiology, describes as burning but not located in epigastrium; could possibly be related to reflux but think this is less likely. History may be unreliable.  P: OTC pain medications for symptomatic relief, if continues consider trial of PPI/H2 blocker.

## 2013-09-19 ENCOUNTER — Emergency Department (HOSPITAL_COMMUNITY): Payer: Medicaid Other

## 2013-09-19 ENCOUNTER — Encounter (HOSPITAL_COMMUNITY): Payer: Self-pay | Admitting: Emergency Medicine

## 2013-09-19 ENCOUNTER — Inpatient Hospital Stay (HOSPITAL_COMMUNITY)
Admission: EM | Admit: 2013-09-19 | Discharge: 2013-09-28 | DRG: 239 | Disposition: A | Discharge status: Rehabilitation Facility | Payer: Medicaid Other | Attending: Family Medicine | Admitting: Family Medicine

## 2013-09-19 DIAGNOSIS — I10 Essential (primary) hypertension: Secondary | ICD-10-CM | POA: Diagnosis present

## 2013-09-19 DIAGNOSIS — T4275XA Adverse effect of unspecified antiepileptic and sedative-hypnotic drugs, initial encounter: Secondary | ICD-10-CM | POA: Diagnosis present

## 2013-09-19 DIAGNOSIS — Z9119 Patient's noncompliance with other medical treatment and regimen: Secondary | ICD-10-CM

## 2013-09-19 DIAGNOSIS — L97409 Non-pressure chronic ulcer of unspecified heel and midfoot with unspecified severity: Secondary | ICD-10-CM | POA: Diagnosis present

## 2013-09-19 DIAGNOSIS — L02419 Cutaneous abscess of limb, unspecified: Secondary | ICD-10-CM | POA: Diagnosis present

## 2013-09-19 DIAGNOSIS — F209 Schizophrenia, unspecified: Secondary | ICD-10-CM | POA: Diagnosis present

## 2013-09-19 DIAGNOSIS — IMO0002 Reserved for concepts with insufficient information to code with codable children: Secondary | ICD-10-CM

## 2013-09-19 DIAGNOSIS — L03119 Cellulitis of unspecified part of limb: Secondary | ICD-10-CM | POA: Diagnosis present

## 2013-09-19 DIAGNOSIS — I70269 Atherosclerosis of native arteries of extremities with gangrene, unspecified extremity: Secondary | ICD-10-CM | POA: Diagnosis present

## 2013-09-19 DIAGNOSIS — L02619 Cutaneous abscess of unspecified foot: Secondary | ICD-10-CM | POA: Diagnosis present

## 2013-09-19 DIAGNOSIS — E669 Obesity, unspecified: Secondary | ICD-10-CM | POA: Diagnosis present

## 2013-09-19 DIAGNOSIS — H5440 Blindness, one eye, unspecified eye: Secondary | ICD-10-CM | POA: Diagnosis present

## 2013-09-19 DIAGNOSIS — G547 Phantom limb syndrome without pain: Secondary | ICD-10-CM | POA: Diagnosis not present

## 2013-09-19 DIAGNOSIS — E1169 Type 2 diabetes mellitus with other specified complication: Secondary | ICD-10-CM

## 2013-09-19 DIAGNOSIS — Z91148 Patient's other noncompliance with medication regimen for other reason: Secondary | ICD-10-CM

## 2013-09-19 DIAGNOSIS — Z5189 Encounter for other specified aftercare: Secondary | ICD-10-CM | POA: Diagnosis not present

## 2013-09-19 DIAGNOSIS — Z87891 Personal history of nicotine dependence: Secondary | ICD-10-CM

## 2013-09-19 DIAGNOSIS — L0390 Cellulitis, unspecified: Secondary | ICD-10-CM | POA: Diagnosis present

## 2013-09-19 DIAGNOSIS — E1165 Type 2 diabetes mellitus with hyperglycemia: Secondary | ICD-10-CM | POA: Diagnosis present

## 2013-09-19 DIAGNOSIS — E1159 Type 2 diabetes mellitus with other circulatory complications: Secondary | ICD-10-CM | POA: Diagnosis present

## 2013-09-19 DIAGNOSIS — Z8249 Family history of ischemic heart disease and other diseases of the circulatory system: Secondary | ICD-10-CM | POA: Diagnosis not present

## 2013-09-19 DIAGNOSIS — Z9114 Patient's other noncompliance with medication regimen: Secondary | ICD-10-CM

## 2013-09-19 DIAGNOSIS — E873 Alkalosis: Secondary | ICD-10-CM | POA: Diagnosis present

## 2013-09-19 DIAGNOSIS — A419 Sepsis, unspecified organism: Secondary | ICD-10-CM | POA: Diagnosis not present

## 2013-09-19 DIAGNOSIS — M79609 Pain in unspecified limb: Secondary | ICD-10-CM | POA: Diagnosis present

## 2013-09-19 DIAGNOSIS — D62 Acute posthemorrhagic anemia: Secondary | ICD-10-CM | POA: Diagnosis not present

## 2013-09-19 DIAGNOSIS — L97509 Non-pressure chronic ulcer of other part of unspecified foot with unspecified severity: Secondary | ICD-10-CM | POA: Diagnosis present

## 2013-09-19 DIAGNOSIS — D473 Essential (hemorrhagic) thrombocythemia: Secondary | ICD-10-CM | POA: Diagnosis present

## 2013-09-19 DIAGNOSIS — E1142 Type 2 diabetes mellitus with diabetic polyneuropathy: Secondary | ICD-10-CM | POA: Diagnosis present

## 2013-09-19 DIAGNOSIS — F2089 Other schizophrenia: Secondary | ICD-10-CM

## 2013-09-19 DIAGNOSIS — L03115 Cellulitis of right lower limb: Secondary | ICD-10-CM

## 2013-09-19 DIAGNOSIS — Z794 Long term (current) use of insulin: Secondary | ICD-10-CM

## 2013-09-19 DIAGNOSIS — Z91199 Patient's noncompliance with other medical treatment and regimen due to unspecified reason: Secondary | ICD-10-CM

## 2013-09-19 DIAGNOSIS — S88119A Complete traumatic amputation at level between knee and ankle, unspecified lower leg, initial encounter: Secondary | ICD-10-CM

## 2013-09-19 DIAGNOSIS — E1149 Type 2 diabetes mellitus with other diabetic neurological complication: Secondary | ICD-10-CM | POA: Diagnosis present

## 2013-09-19 DIAGNOSIS — E871 Hypo-osmolality and hyponatremia: Secondary | ICD-10-CM | POA: Diagnosis present

## 2013-09-19 DIAGNOSIS — E876 Hypokalemia: Secondary | ICD-10-CM | POA: Diagnosis present

## 2013-09-19 DIAGNOSIS — K5909 Other constipation: Secondary | ICD-10-CM | POA: Diagnosis present

## 2013-09-19 DIAGNOSIS — E119 Type 2 diabetes mellitus without complications: Secondary | ICD-10-CM | POA: Diagnosis present

## 2013-09-19 LAB — I-STAT VENOUS BLOOD GAS, ED
Acid-Base Excess: 1 mmol/L (ref 0.0–2.0)
Bicarbonate: 24.5 mEq/L — ABNORMAL HIGH (ref 20.0–24.0)
O2 Saturation: 67 %
TCO2: 26 mmol/L (ref 0–100)
pCO2, Ven: 35.9 mmHg — ABNORMAL LOW (ref 45.0–50.0)
pH, Ven: 7.441 — ABNORMAL HIGH (ref 7.250–7.300)
pO2, Ven: 33 mmHg (ref 30.0–45.0)

## 2013-09-19 LAB — URINALYSIS, ROUTINE W REFLEX MICROSCOPIC
Glucose, UA: >1000 mg/dL — AB
Ketones, ur: >80 mg/dL — AB
Leukocytes, UA: NEGATIVE
Nitrite: NEGATIVE
Protein, ur: 100 mg/dL — AB
Specific Gravity, Urine: 1.036 — ABNORMAL HIGH (ref 1.005–1.030)
Urobilinogen, UA: 1 mg/dL (ref 0.0–1.0)
pH: 5 (ref 5.0–8.0)

## 2013-09-19 LAB — BASIC METABOLIC PANEL
Anion gap: 24 — ABNORMAL HIGH (ref 5–15)
BUN: 15 mg/dL (ref 6–23)
CO2: 21 mEq/L (ref 19–32)
Calcium: 9.4 mg/dL (ref 8.4–10.5)
Chloride: 87 mEq/L — ABNORMAL LOW (ref 96–112)
Creatinine, Ser: 0.84 mg/dL (ref 0.50–1.35)
GFR calc Af Amer: >90 mL/min (ref >90)
GFR calc non Af Amer: >90 mL/min (ref >90)
Glucose, Bld: 293 mg/dL — ABNORMAL HIGH (ref 70–99)
Potassium: 4.4 mEq/L (ref 3.7–5.3)
Sodium: 132 mEq/L — ABNORMAL LOW (ref 137–147)

## 2013-09-19 LAB — LACTIC ACID, PLASMA: Lactic Acid, Venous: 1.5 mmol/L (ref 0.5–2.2)

## 2013-09-19 LAB — CBC
HCT: 37.1 % — ABNORMAL LOW (ref 39.0–52.0)
Hemoglobin: 12.7 g/dL — ABNORMAL LOW (ref 13.0–17.0)
MCH: 27.5 pg (ref 26.0–34.0)
MCHC: 34.2 g/dL (ref 30.0–36.0)
MCV: 80.3 fL (ref 78.0–100.0)
Platelets: 387 10*3/uL (ref 150–400)
RBC: 4.62 MIL/uL (ref 4.22–5.81)
RDW: 12.3 % (ref 11.5–15.5)
WBC: 22.6 10*3/uL — ABNORMAL HIGH (ref 4.0–10.5)

## 2013-09-19 LAB — CBG MONITORING, ED: Glucose-Capillary: 282 mg/dL — ABNORMAL HIGH (ref 70–99)

## 2013-09-19 LAB — URINE MICROSCOPIC-ADD ON

## 2013-09-19 MED ORDER — MORPHINE SULFATE 4 MG/ML IJ SOLN
8.0000 mg | Freq: Once | INTRAMUSCULAR | Status: AC
  Administered 2013-09-19: 8 mg via INTRAVENOUS
  Filled 2013-09-19: qty 2

## 2013-09-19 MED ORDER — SODIUM CHLORIDE 0.9 % IV SOLN
Freq: Once | INTRAVENOUS | Status: AC
  Administered 2013-09-20: 02:00:00 via INTRAVENOUS

## 2013-09-19 MED ORDER — CEFTRIAXONE SODIUM 1 G IJ SOLR: 1.0000 g | Freq: Once | INTRAMUSCULAR | Status: DC

## 2013-09-19 MED ORDER — SODIUM CHLORIDE 0.9 % IV BOLUS (SEPSIS)
1000.0000 mL | Freq: Once | INTRAVENOUS | Status: AC
  Administered 2013-09-19: 1000 mL via INTRAVENOUS

## 2013-09-19 MED ORDER — DEXTROSE 5 % IV SOLN
1.0000 g | Freq: Once | INTRAVENOUS | Status: AC
  Administered 2013-09-19: 1 g via INTRAVENOUS
  Filled 2013-09-19: qty 10

## 2013-09-19 MED ORDER — ONDANSETRON HCL 4 MG/2ML IJ SOLN
4.0000 mg | Freq: Once | INTRAMUSCULAR | Status: AC
  Administered 2013-09-19: 4 mg via INTRAVENOUS
  Filled 2013-09-19: qty 2

## 2013-09-19 NOTE — H&P (Signed)
[name] Admission History and Physical Service Pager: [phone]  Patient name: [name] Medical record number: [id number] Date of birth: 04-18-1955 Age: 58 y.o. Gender: male  Primary Care Provider: Tawanna Sat, MD Consultants: None Code Status: full code (discussed with pt upon admission)  Chief Complaint: R foot pain  Assessment and Plan: BOWDY BAIR is a 58 y.o. male presenting with RLE Cellulitis. PMH is significant for T2DM, HTN, and Schizophrenia.  RLE Cellulitis: Likely 2/2 diabetic ulcer in setting of uncontrolled T2 DM after stopping all medications.  No evidence of osseous involvement. Unlikely to need more advanced imaging studies as ulceration appears superficial.  - Start Vanc/Zosyn, as Rocephin will not cover for MRSA or Pseudomonas in this diabetic patient. - WBC 22.6 on admission. Continue to monitor and trend WBC. - Oxycodone IR 5mg  q4h prn for pain - elevate foot as much as possible - if no improvement with IV abx consider ortho consult  AG Metabolic alkalosis: AG 24, bicarb 24.5. Likely related to infection, dehydration, and non-compliance with insulin. No current evidence of DKA as no acidosis on VBG, glucose not severely elevated, mentating clearly. - hydrate with IVF - Continue to monitor with AM BMET.  T2DM, uncontrolled: Last A1c 13.3. Not taking medications regularly at home. - Continue Lantus 25 units qhs (home dose listed as 50 units qhs) - Moderate SSI - Continue to monitor CBGs.  Hyponatremia: Na 132 on admission.  Likely hypovolemic hyponatremia in setting of decreased PO intake. S/p 2L bolus in ER. - NS at 125 mL/hr overnight - Continue to monitor  HTN: BP stable. 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  Pupil non-reactive and dilated. Followed by ophtho as an outpatient. - Continue home  Cosopt and Alphagan drops  FEN/GI: Heart healthy/carb modified diet, NS @ 125 mL/hr Prophylaxis: SQ Heparin  Disposition: Admit to floor, dispo pending clinical improvement and transition to PO Abx  History of Present Illness: [name] is a 58 y.o. male presenting with RLE cellulitis. PMH significant for T2DM, HTN, and Schizophrenia.  On Tuesday, foot began hurting and became swollen. Noticed some redness in his foot prior to the pain, starting about 2-3 weeks ago, also began having an ulcer on the lateral aspect of his R foot around that time. Does not recall any preceding injury. Denies pain elsewhere in his body.  Has had chills at home when his air conditioning was on and night sweats for the last 3 nights. Denies chest pain or SOB. Has noticed urine darker than normal since Tuesday as well.  Denies abd/flank pain, polyuria, polydipsia, and dysuria.  No constipation or diarrhea. Has not been eating well due to the pain in his foot as he can't get around with the foot pain. Has been drinking lots of water, though.   He has not been taking medications regularly at home, states he takes them occasionally. Has not taken any diabetes medicine in the last week.  In the ED: NS 1L bolus x2, Morphine IV 8mg  x2, Rochepin 1g x1  Review Of Systems: Per HPI with the following additions: Reports loss of vision in L eye for many years Otherwise 12 point review of systems was performed and was unremarkable.  Patient Active Problem List   Diagnosis Date Noted  . Cellulitis 09/19/2013  . Abdominal pain, unspecified site 09/06/2013  . Noncompliance with medications  07/13/2010  . OTHER SPEC TYPES SCHIZOPHRENIA UNSPEC CONDITION 05/24/2009  . OBESITY, UNSPECIFIED 06/09/2007  . DM (diabetes mellitus), type 2, uncontrolled 05/02/2006  . HYPERTENSION, BENIGN SYSTEMIC 05/02/2006   Past Medical History: Past Medical History  Diagnosis Date  . Diabetes mellitus   . Hypertension    Past Surgical  History: History reviewed. No pertinent past surgical history. Social History: History  Substance Use Topics  . Smoking status: Never Smoker   . Smokeless tobacco: Not on file  . Alcohol Use: No   Additional social history: no smoking in the last 20 years, also denies alcohol or drug use  Please also refer to relevant sections of EMR.  Family History: No family history on file.  Allergies and Medications: No Known Allergies No current facility-administered medications on file prior to encounter.   Current Outpatient Prescriptions on File Prior to Encounter  Medication Sig Dispense Refill  . ACCU-CHEK AVIVA PLUS test strip USE AS INSTRUCTED  100 each  4  . amLODipine (NORVASC) 10 MG tablet Take 1 tablet (10 mg total) by mouth daily.  90 tablet  3  . brimonidine (ALPHAGAN) 0.15 % ophthalmic solution Place 1 drop into the left eye 2 (two) times daily.      . dorzolamide-timolol (COSOPT) 22.3-6.8 MG/ML ophthalmic solution Place 1 drop into the left eye 2 (two) times daily.      . INS SYRINGE/NEEDLE 1CC/29G ([employer] INSULIN SYRINGE 1CC/29G) 29G X 1/2" 1 ML MISC by Does not apply route.        . insulin aspart (NOVOLOG) 100 UNIT/ML injection Inject 5 Units into the skin 3 (three) times daily before meals. Do not take if sugar is <150  3 vial  PRN  . insulin glargine (LANTUS) 100 UNIT/ML injection Inject 0.5 mLs (50 Units total) into the skin at bedtime.  20 mL  3  . Lancet Devices (SOFT TOUCH LANCET DEVICE) MISC by Does not apply route.        . Lancets (ACCU-CHEK SOFT TOUCH) lancets 1 each by Other route as needed. Use as instructed       . lisinopril (PRINIVIL,ZESTRIL) 10 MG tablet Take 1 tablet (10 mg total) by mouth daily.  90 tablet  3    Objective: BP 135/75  Pulse 82  Temp(Src) 101.5 F (38.6 C) (Oral)  Resp 20  Ht 6' (1.829 m)  Wt 220 lb (99.791 kg)  BMI 29.83 kg/m2  SpO2 95% Exam: General: Pleasant man sitting in bed in NAD HEENT: NCAT, mildly dry MM, OP clear, EOMI, r  pupil RRL, L pupil dilated, non-reactive, with possible cataract Cardiovascular: RRR, no m/r/g, DP pulses 1+ b/l Respiratory: CTAB, no w/r/c. Normal WOB Abdomen: Soft, ND/NT, NABS, no HSM Extremities: superficial ulcer at base of R 5th metatarsal, RLE with 1-2+ pitting edema to knee and very swollen R foot. LLE no edema, no ulcerations. R calf nontender to palpation, no palpable cords. Good capillary refill in R toes, sensation intact to touch of R toes. Skin: No rash, ulceration of R foot as above. Neuro: L pupil non-reactive to light and dilated.  Moves all extremities equally, sensation intact in b/l LEs. Alert Psych: flat affect, no evidence of responding to internal stimuli  Labs and Imaging: CBC BMET   Recent Labs Lab 09/19/13 1721  WBC 22.6*  HGB 12.7*  HCT 37.1*  PLT 387    Recent Labs Lab 09/19/13 1721  NA 132*  K 4.4  CL 87*  CO2 21  BUN 15  CREATININE 0.84  GLUCOSE 293*  CALCIUM 9.4      VBG: pH 7.441, pCO2 35.9, pO2 33, Bicarb 24.5  R ankle XRay (7/18): No fracture or dislocation is noted. Soft tissue swelling is seen  over lateral malleolus suggesting ligamentous injury.  R foot XRay (7/18): Soft tissue swelling and ulceration adjacent to the base of the  fifth metatarsal. No underlying osseous abnormality identified.   Lavon Paganini, MD 09/20/2013, 12:23 AM PGY-1, [place] Intern pager: [phone], text pages welcome  Upper Level Addendum:  I have seen and evaluated this patient along with Dr. Brita Romp and reviewed the above note, making necessary revisions in pink.   Chrisandra Netters, MD Family Medicine PGY-3

## 2013-09-19 NOTE — ED Notes (Signed)
Abnormal labs given to Dr. Wilson Singer

## 2013-09-19 NOTE — ED Notes (Signed)
cbg was 282.

## 2013-09-19 NOTE — ED Notes (Signed)
Pt taken to XRay 

## 2013-09-19 NOTE — ED Provider Notes (Signed)
CSN: [id number]     Arrival date & time 09/19/13  1620 History   First MD Initiated Contact with Patient 09/19/13 1647     Chief Complaint  Patient presents with  . Foot Pain     (Consider location/radiation/quality/duration/timing/severity/associated sxs/prior Treatment) Patient is a 58 y.o. male presenting with leg pain.  Leg Pain Location:  Leg Time since incident:  2 weeks Injury: no   Leg location:  R leg Pain details:    Quality:  Burning   Radiates to:  Does not radiate   Severity:  Moderate Associated symptoms: no back pain     Past Medical History  Diagnosis Date  . Diabetes mellitus   . Hypertension    History reviewed. No pertinent past surgical history. No family history on file. History  Substance Use Topics  . Smoking status: Never Smoker   . Smokeless tobacco: Not on file  . Alcohol Use: No    Review of Systems  Constitutional: Negative for activity change.  HENT: Negative for congestion.   Eyes: Negative for visual disturbance.  Respiratory: Negative for cough and shortness of breath.   Cardiovascular: Negative for chest pain and leg swelling.  Gastrointestinal: Negative for abdominal pain and blood in stool.  Genitourinary: Negative for dysuria and hematuria.  Musculoskeletal: Negative for back pain.  Skin: Positive for color change and rash.  Neurological: Negative for syncope and headaches.  Psychiatric/Behavioral: Negative for agitation.      Allergies  Review of patient's allergies indicates no known allergies.  Home Medications   Prior to Admission medications   Medication Sig Start Date End Date Taking? Authorizing Provider  ACCU-CHEK AVIVA PLUS test strip USE AS INSTRUCTED 10/24/12  Yes Tawanna Sat, MD  amLODipine (NORVASC) 10 MG tablet Take 1 tablet (10 mg total) by mouth daily. 06/08/13  Yes Tawanna Sat, MD  brimonidine (ALPHAGAN) 0.15 % ophthalmic solution Place 1 drop into the left eye 2 (two) times daily.   Yes Historical  Provider, MD  dorzolamide-timolol (COSOPT) 22.3-6.8 MG/ML ophthalmic solution Place 1 drop into the left eye 2 (two) times daily.   Yes Historical Provider, MD  ibuprofen (ADVIL,MOTRIN) 200 MG tablet Take 200 mg by mouth every 6 (six) hours as needed.   Yes Historical Provider, MD  INS SYRINGE/NEEDLE 1CC/29G ([employer] INSULIN SYRINGE 1CC/29G) 29G X 1/2" 1 ML MISC by Does not apply route.     Yes Historical Provider, MD  insulin aspart (NOVOLOG) 100 UNIT/ML injection Inject 5 Units into the skin 3 (three) times daily before meals. Do not take if sugar is <150 07/07/13  Yes Tawanna Sat, MD  insulin glargine (LANTUS) 100 UNIT/ML injection Inject 0.5 mLs (50 Units total) into the skin at bedtime. 07/07/13  Yes Tawanna Sat, MD  Lancet Devices ([place]) [place] by Does not apply route.     Yes Historical Provider, MD  Lancets (ACCU-CHEK SOFT TOUCH) lancets 1 each by Other route as needed. Use as instructed    Yes Historical Provider, MD  lisinopril (PRINIVIL,ZESTRIL) 10 MG tablet Take 1 tablet (10 mg total) by mouth daily. 06/08/13  Yes Tawanna Sat, MD  Tetrahydrozoline HCl (VISINE OP) Apply 1 drop to eye 2 (two) times daily. Only uses in left eye.   Yes Historical Provider, MD   BP 154/75  Temp(Src) 101.5 F (38.6 C) (Oral)  Resp 13  Ht 6' (1.829 m)  Wt 220 lb (99.791 kg)  BMI 29.83 kg/m2  SpO2 95% Physical Exam  Nursing note and  vitals reviewed. Constitutional: He is oriented to person, place, and time. He appears well-developed and well-nourished.  HENT:  Head: Normocephalic.  Eyes: Pupils are equal, round, and reactive to light.  Neck: Neck supple.  Cardiovascular: Normal rate and regular rhythm.  Exam reveals no gallop and no friction rub.   No murmur heard. Pulmonary/Chest: Effort normal. No respiratory distress.  Abdominal: Soft. He exhibits no distension. There is no tenderness.  Musculoskeletal: He exhibits tenderness. He exhibits no edema.  Notable redness of right foot, pain  in foot and leg   Neurological: He is alert and oriented to person, place, and time.  Skin: Skin is warm.  Psychiatric: He has a normal mood and affect.    ED Course  Procedures (including critical care time) Labs Review Labs Reviewed - No data to display  Imaging Review No results found.   EKG Interpretation None      MDM   Final diagnoses:  Cellulitis of right lower extremity  DM (diabetes mellitus), type 2, uncontrolled  Essential hypertension, benign  Noncompliance with medications   58 year old male with past medical history of diabetes the presents with cellulitis of the right lower extremity. Patient also states that he has discontinued using his diabetes medication 5 days prior. On physical exam the leg has notable cellulitis is warm to the touch. Pulse motor and sensation is all intact. Passive range of motion without severe pain. Unlikely septic joint. Likely expanding cellulitis with potential abscess as well. Patient evaluated with screening labs is currently not in DKA however demonstrates poor glucose control. Patient is alert and oriented and denies nausea or vomiting.  Patient started on clindamycin in the department. Given 1 L bolus normal saline. The admitted to triad hospitalist service under Dr. Posey Pronto.    Claudean Severance, MD 09/21/13 [phone]

## 2013-09-19 NOTE — ED Notes (Signed)
Per PTar pt has had uler on rt foot for several week. Pain and swelling increased since Tuesday. Pt states that ulcer "just came up". Pt from home. States that he has not taken daily meds today and states last insulin was Monday.

## 2013-09-20 ENCOUNTER — Encounter (HOSPITAL_COMMUNITY): Payer: Self-pay

## 2013-09-20 DIAGNOSIS — E1165 Type 2 diabetes mellitus with hyperglycemia: Secondary | ICD-10-CM

## 2013-09-20 DIAGNOSIS — I10 Essential (primary) hypertension: Secondary | ICD-10-CM

## 2013-09-20 DIAGNOSIS — Z91199 Patient's noncompliance with other medical treatment and regimen due to unspecified reason: Secondary | ICD-10-CM

## 2013-09-20 DIAGNOSIS — L02419 Cutaneous abscess of limb, unspecified: Secondary | ICD-10-CM

## 2013-09-20 DIAGNOSIS — Z9119 Patient's noncompliance with other medical treatment and regimen: Secondary | ICD-10-CM

## 2013-09-20 DIAGNOSIS — IMO0001 Reserved for inherently not codable concepts without codable children: Secondary | ICD-10-CM

## 2013-09-20 DIAGNOSIS — L03119 Cellulitis of unspecified part of limb: Secondary | ICD-10-CM

## 2013-09-20 LAB — BASIC METABOLIC PANEL
Anion gap: 16 — ABNORMAL HIGH (ref 5–15)
BUN: 16 mg/dL (ref 6–23)
CO2: 25 mEq/L (ref 19–32)
Calcium: 9.1 mg/dL (ref 8.4–10.5)
Chloride: 92 mEq/L — ABNORMAL LOW (ref 96–112)
Creatinine, Ser: 0.89 mg/dL (ref 0.50–1.35)
GFR calc Af Amer: >90 mL/min (ref >90)
GFR calc non Af Amer: >90 mL/min (ref >90)
Glucose, Bld: 291 mg/dL — ABNORMAL HIGH (ref 70–99)
Potassium: 4.5 mEq/L (ref 3.7–5.3)
Sodium: 133 mEq/L — ABNORMAL LOW (ref 137–147)

## 2013-09-20 LAB — CBC
HCT: 32.8 % — ABNORMAL LOW (ref 39.0–52.0)
Hemoglobin: 11 g/dL — ABNORMAL LOW (ref 13.0–17.0)
MCH: 26.8 pg (ref 26.0–34.0)
MCHC: 33.5 g/dL (ref 30.0–36.0)
MCV: 80 fL (ref 78.0–100.0)
Platelets: 377 10*3/uL (ref 150–400)
RBC: 4.1 MIL/uL — ABNORMAL LOW (ref 4.22–5.81)
RDW: 12.4 % (ref 11.5–15.5)
WBC: 23.4 10*3/uL — ABNORMAL HIGH (ref 4.0–10.5)

## 2013-09-20 LAB — GLUCOSE, CAPILLARY
Glucose-Capillary: 241 mg/dL — ABNORMAL HIGH (ref 70–99)
Glucose-Capillary: 181 mg/dL — ABNORMAL HIGH (ref 70–99)
Glucose-Capillary: 216 mg/dL — ABNORMAL HIGH (ref 70–99)
Glucose-Capillary: 247 mg/dL — ABNORMAL HIGH (ref 70–99)

## 2013-09-20 MED ORDER — PIPERACILLIN-TAZOBACTAM 3.375 G IVPB 30 MIN
3.3750 g | Freq: Once | INTRAVENOUS | Status: AC
  Administered 2013-09-20: 3.375 g via INTRAVENOUS
  Filled 2013-09-20: qty 50

## 2013-09-20 MED ORDER — VANCOMYCIN HCL IN DEXTROSE 1-5 GM/200ML-% IV SOLN
1000.0000 mg | Freq: Once | INTRAVENOUS | Status: AC
  Administered 2013-09-20: 1000 mg via INTRAVENOUS
  Filled 2013-09-20: qty 200

## 2013-09-20 MED ORDER — ACETAMINOPHEN 650 MG RE SUPP
650.0000 mg | Freq: Four times a day (QID) | RECTAL | Status: DC | PRN
  Filled 2013-09-20: qty 1

## 2013-09-20 MED ORDER — DORZOLAMIDE HCL-TIMOLOL MAL 2-0.5 % OP SOLN
1.0000 [drp] | Freq: Two times a day (BID) | OPHTHALMIC | Status: DC
  Administered 2013-09-20 – 2013-09-28 (×17): 1 [drp] via OPHTHALMIC
  Filled 2013-09-20: qty 10

## 2013-09-20 MED ORDER — ONDANSETRON HCL 4 MG PO TABS: 4.0000 mg | ORAL_TABLET | Freq: Four times a day (QID) | ORAL | Status: DC | PRN

## 2013-09-20 MED ORDER — ONDANSETRON HCL 4 MG/2ML IJ SOLN: 4.0000 mg | Freq: Four times a day (QID) | INTRAMUSCULAR | Status: DC | PRN

## 2013-09-20 MED ORDER — BRIMONIDINE TARTRATE 0.2 % OP SOLN
1.0000 [drp] | Freq: Two times a day (BID) | OPHTHALMIC | Status: DC
  Administered 2013-09-20 – 2013-09-28 (×17): 1 [drp] via OPHTHALMIC
  Filled 2013-09-20: qty 5

## 2013-09-20 MED ORDER — INSULIN ASPART 100 UNIT/ML ~~LOC~~ SOLN
0.0000 [IU] | Freq: Three times a day (TID) | SUBCUTANEOUS | Status: DC
  Administered 2013-09-20: 3 [IU] via SUBCUTANEOUS
  Administered 2013-09-20 – 2013-09-21 (×3): 5 [IU] via SUBCUTANEOUS
  Administered 2013-09-21: 3 [IU] via SUBCUTANEOUS
  Administered 2013-09-21 – 2013-09-22 (×2): 5 [IU] via SUBCUTANEOUS
  Administered 2013-09-22: 8 [IU] via SUBCUTANEOUS
  Administered 2013-09-22: 13:00:00 via SUBCUTANEOUS
  Administered 2013-09-23: 5 [IU] via SUBCUTANEOUS
  Administered 2013-09-23: 100 [IU] via SUBCUTANEOUS
  Administered 2013-09-23: 3 [IU] via SUBCUTANEOUS
  Administered 2013-09-24 – 2013-09-25 (×4): 2 [IU] via SUBCUTANEOUS
  Administered 2013-09-25: 3 [IU] via SUBCUTANEOUS
  Administered 2013-09-25: 5 [IU] via SUBCUTANEOUS
  Administered 2013-09-26 – 2013-09-28 (×3): 3 [IU] via SUBCUTANEOUS

## 2013-09-20 MED ORDER — AMLODIPINE BESYLATE 10 MG PO TABS
10.0000 mg | ORAL_TABLET | Freq: Every day | ORAL | Status: DC
  Administered 2013-09-20 – 2013-09-25 (×6): 10 mg via ORAL
  Filled 2013-09-20: qty 2
  Filled 2013-09-20 (×6): qty 1

## 2013-09-20 MED ORDER — HEPARIN SODIUM (PORCINE) 5000 UNIT/ML IJ SOLN
5000.0000 [IU] | Freq: Three times a day (TID) | INTRAMUSCULAR | Status: DC
  Administered 2013-09-20 – 2013-09-28 (×23): 5000 [IU] via SUBCUTANEOUS
  Filled 2013-09-20 (×31): qty 1

## 2013-09-20 MED ORDER — INSULIN GLARGINE 100 UNIT/ML ~~LOC~~ SOLN
25.0000 [IU] | Freq: Every day | SUBCUTANEOUS | Status: DC
  Administered 2013-09-20 (×2): 25 [IU] via SUBCUTANEOUS
  Filled 2013-09-20 (×3): qty 0.25

## 2013-09-20 MED ORDER — PIPERACILLIN-TAZOBACTAM 3.375 G IVPB
3.3750 g | Freq: Three times a day (TID) | INTRAVENOUS | Status: DC
Start: 2013-09-20 — End: 2013-09-22
  Administered 2013-09-20 – 2013-09-22 (×7): 3.375 g via INTRAVENOUS
  Filled 2013-09-20 (×11): qty 50

## 2013-09-20 MED ORDER — MORPHINE SULFATE ER 30 MG PO TBCR
30.0000 mg | EXTENDED_RELEASE_TABLET | Freq: Once | ORAL | Status: AC
  Administered 2013-09-20: 30 mg via ORAL
  Filled 2013-09-20: qty 2

## 2013-09-20 MED ORDER — LISINOPRIL 10 MG PO TABS
10.0000 mg | ORAL_TABLET | Freq: Every day | ORAL | Status: DC
  Administered 2013-09-20 – 2013-09-25 (×6): 10 mg via ORAL
  Filled 2013-09-20 (×7): qty 1

## 2013-09-20 MED ORDER — OXYCODONE HCL 5 MG PO TABS: 5.0000 mg | ORAL_TABLET | Freq: Once | ORAL | Status: DC

## 2013-09-20 MED ORDER — ACETAMINOPHEN 325 MG PO TABS
650.0000 mg | ORAL_TABLET | Freq: Four times a day (QID) | ORAL | Status: DC | PRN
  Administered 2013-09-20 – 2013-09-25 (×12): 650 mg via ORAL
  Filled 2013-09-20 (×11): qty 2

## 2013-09-20 MED ORDER — OXYCODONE HCL 5 MG PO TABS
5.0000 mg | ORAL_TABLET | ORAL | Status: DC | PRN
  Administered 2013-09-20 – 2013-09-23 (×10): 5 mg via ORAL
  Filled 2013-09-20 (×10): qty 1

## 2013-09-20 MED ORDER — VANCOMYCIN HCL IN DEXTROSE 1-5 GM/200ML-% IV SOLN
1000.0000 mg | Freq: Three times a day (TID) | INTRAVENOUS | Status: DC
  Administered 2013-09-20 – 2013-09-26 (×19): 1000 mg via INTRAVENOUS
  Filled 2013-09-20 (×27): qty 200

## 2013-09-20 NOTE — Consult Note (Signed)
WOC wound consult note Reason for Consult:Right lateral midfoot ulcer with periwound erythema, induration, pain, and ulcer in patient with diabetes. Wound type:Infectious Pressure Ulcer POA: No Measurement:Area of involvement measures 5cm x 12cm.  There is a full thickness ulcer in the center measuring 1.5cm x 1cm with dried serum partially obscuring the wound bed.  Anterioraly there is an indurated and erythematous margin extending 3.5cm.  Posteriorly (and onto the plantar surface) there is a white discolored area of tissue. The patient is experiencing pain at the time of my visit and I wait to examine his foot until he has been premedicated. Wound bed:As described above Drainage (amount, consistency, odor) Scant to small amount of light yellow exudate on old dressing  No fresh exudate. Periwound:AS described above. Dressing procedure/placement/frequency: I feel this ulcer and foot are outside of the scope of [facility] practice and suggest consultation with orthopedic services is indicated. I would defer to their expert opinion and oversight in this complex situation where foot or partial foot salvation is at stake.  If you agree, please order. In the meantime, a conservative POC has been implemented for nursing (saline moistened gauze dressings changed twice daily and elevation. [facility] nursing team will not follow, but will remain available to this patient, the nursing and medical teams  Please re-consult if needed. Thanks, Maudie Flakes, MSN, RN, [place], [place], [place] [phone])

## 2013-09-20 NOTE — Progress Notes (Signed)
Utilization review completed.  

## 2013-09-20 NOTE — Progress Notes (Signed)
ANTIBIOTIC CONSULT NOTE - INITIAL  Pharmacy Consult for Vancocin and Zosyn Indication: cellulitis  No Known Allergies  Patient Measurements: Height: 6' (182.9 cm) Weight: 220 lb (99.791 kg) IBW/kg (Calculated) : 77.6  Vital Signs: Temp: 101.5 F (38.6 C) (07/18 1629) Temp src: Oral (07/18 1629) BP: 135/75 mmHg (07/19 0001) Pulse Rate: 82 (07/19 0001)  Labs:  Recent Labs  09/19/13 1721  WBC 22.6*  HGB 12.7*  PLT 387  CREATININE 0.84   Estimated Creatinine Clearance: 120.1 ml/min (by C-G formula based on Cr of 0.84).    Medical History: Past Medical History  Diagnosis Date  . Diabetes mellitus   . Hypertension     Assessment: 58yo male c/o foot ulcer for several weeks w/ new increasing swelling and pain, denies injury and says ulcer "just came up", has difficulty ambulating d/t ulcer, does have h/o DM, to begin IV ABX for cellulitis.  Goal of Therapy:  Vancomycin trough level 10-15 mcg/ml  Plan:  Will begin vancomycin 1000mg  IV Q8H and Zosyn 3.375g IV Q8H and monitor CBC, Cx, levels prn.  Wynona Neat, PharmD, BCPS  09/20/2013,12:37 AM

## 2013-09-20 NOTE — ED Notes (Signed)
Attempted to call report x1, 6N RN unable to take report at this time, will call again later

## 2013-09-20 NOTE — H&P (Signed)
Family Medicine Teaching Service Attending Note  I interviewed and examined patient [name] and reviewed their tests and x-rays.  I discussed with Dr. Ardelia Mems and reviewed their note for today.  I agree with their assessment and plan.     Additionally  Feels some better No systemic symptoms  Foot - appears to be possible pus collection around wound site?   If not responding to antibiotics and elevate would consider aspiration or [place]

## 2013-09-20 NOTE — ED Notes (Signed)
Family practice at bedside.

## 2013-09-21 LAB — CBC
HCT: 33.3 % — ABNORMAL LOW (ref 39.0–52.0)
Hemoglobin: 11 g/dL — ABNORMAL LOW (ref 13.0–17.0)
MCH: 26.7 pg (ref 26.0–34.0)
MCHC: 33 g/dL (ref 30.0–36.0)
MCV: 80.8 fL (ref 78.0–100.0)
Platelets: 396 10*3/uL (ref 150–400)
RBC: 4.12 MIL/uL — ABNORMAL LOW (ref 4.22–5.81)
RDW: 12.5 % (ref 11.5–15.5)
WBC: 26.8 10*3/uL — ABNORMAL HIGH (ref 4.0–10.5)

## 2013-09-21 LAB — BASIC METABOLIC PANEL
Anion gap: 15 (ref 5–15)
BUN: 18 mg/dL (ref 6–23)
CO2: 26 mEq/L (ref 19–32)
Calcium: 9.5 mg/dL (ref 8.4–10.5)
Chloride: 96 mEq/L (ref 96–112)
Creatinine, Ser: 1.2 mg/dL (ref 0.50–1.35)
GFR calc Af Amer: 76 mL/min — ABNORMAL LOW (ref >90)
GFR calc non Af Amer: 66 mL/min — ABNORMAL LOW (ref >90)
Glucose, Bld: 215 mg/dL — ABNORMAL HIGH (ref 70–99)
Potassium: 4.2 mEq/L (ref 3.7–5.3)
Sodium: 137 mEq/L (ref 137–147)

## 2013-09-21 LAB — GLUCOSE, CAPILLARY
Glucose-Capillary: 207 mg/dL — ABNORMAL HIGH (ref 70–99)
Glucose-Capillary: 181 mg/dL — ABNORMAL HIGH (ref 70–99)
Glucose-Capillary: 218 mg/dL — ABNORMAL HIGH (ref 70–99)
Glucose-Capillary: 195 mg/dL — ABNORMAL HIGH (ref 70–99)

## 2013-09-21 MED ORDER — POLYETHYLENE GLYCOL 3350 17 G PO PACK
17.0000 g | PACK | Freq: Every day | ORAL | Status: DC
  Administered 2013-09-21 – 2013-09-23 (×3): 17 g via ORAL
  Filled 2013-09-21 (×5): qty 1

## 2013-09-21 MED ORDER — INSULIN GLARGINE 100 UNIT/ML ~~LOC~~ SOLN
30.0000 [IU] | Freq: Every day | SUBCUTANEOUS | Status: DC
  Administered 2013-09-21: 30 [IU] via SUBCUTANEOUS
  Filled 2013-09-21 (×2): qty 0.3

## 2013-09-21 NOTE — Progress Notes (Signed)
Notified MD pt's fever 102.9, MD did not want to take any action at this time.

## 2013-09-21 NOTE — Progress Notes (Signed)
Family Medicine Teaching Service Daily Progress Note Intern Pager: [phone]  Patient name: [name] Medical record number: [id number] Date of birth: 1955/04/15 Age: 58 y.o. Gender: male  Primary Care Provider: Tawanna Sat, MD Consultants: Ortho Code Status: Full code  Pt Overview and Major Events to Date:  7/19 Admit for IV Abx (Vanc/Zosyn) for RLE cellulitis and diabetic management 7/20 Febrile multiple times O/N -> Ortho c/s for possible debridement  Assessment and Plan:  [name] is a 58 y.o. male presenting with RLE Cellulitis. PMH is significant for T2DM, HTN, and Schizophrenia.   # RLE Cellulitis: Likely 2/2 diabetic ulcer in setting of uncontrolled T2 DM after stopping all medications. No evidence of osseous involvement.  - Febrile O/N to 102.9. - WBC 22.6>23.4>26.8. Continue to monitor and trend WBC.  - Continue Vanc/Zosyn.  - Oxycodone IR 5mg  q4h prn for pain. One time dose of MS Contin 30mg  O/N as Oxy was not helping. - Miralax daily for constipation while taking narcotics. - elevate foot as much as possible  - Discussed with Ortho this AM, will see him today.   # AG Metabolic alkalosis: AG of 24 on admission, no evidence of DKA as no acidosis on VBG, glucose not severely elevated, and mentating clearly.  AG now closed at 12. - Continue to monitor with AM BMET.   # T2DM, uncontrolled: Last A1c 13.3. Not taking medications regularly at home. CBGs 181-247 O/N. - Consider increasing Lantus dose from 25 qhs to 30 qhs - Continue Moderate SSI  - Continue to monitor CBGs.   # Hyponatremia: Resolved. Na now 137. Na 132 on admission. Likely hypovolemic hyponatremia in setting of decreased PO intake.  - Continue to monitor   # HTN: BP stable.  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 Pupil non-reactive and dilated. Followed by ophtho as an  outpatient.  - Continue home Cosopt and Alphagan drops   FEN/GI: Heart healthy/carb modified diet, KVO Prophylaxis: SQ Heparin  Disposition: Pending clinical improvement, transition to PO Abx and possible debridement  Subjective:  Pt reports that pain is well controlled this AM.  He reports fever/chills O/N.  He has not had a BM since admission.  Objective: Temp:  [98.2 F (36.8 C)-102.9 F (39.4 C)] 99.3 F (37.4 C) (07/20 0550) Pulse Rate:  [58-95] 81 (07/20 0550) Resp:  [16-20] 18 (07/20 0550) BP: (110-156)/(56-77) 110/63 mmHg (07/20 0550) SpO2:  [93 %-98 %] 93 % (07/20 0550) Physical Exam: General: Pleasant man laying in bed in NAD  Cardiovascular: RRR, no m/r/g, DP pulses 1+ b/l  Respiratory: CTAB, no w/r/c. Normal WOB  Abdomen: Soft, ND/NT, NABS, no HSM  Extremities: dressing in place over R foot, c/d/i.  Neuro: Alert & O x3  Psych: flat affect, no evidence of responding to internal stimuli   Laboratory:  Recent Labs Lab 09/19/13 1721 09/20/13 0403 09/21/13 0424  WBC 22.6* 23.4* 26.8*  HGB 12.7* 11.0* 11.0*  HCT 37.1* 32.8* 33.3*  PLT 387 377 396    Recent Labs Lab 09/19/13 1721 09/20/13 0403 09/21/13 0424  NA 132* 133* 137  K 4.4 4.5 4.2  CL 87* 92* 96  CO2 21 25 26   BUN 15 16 18   CREATININE 0.84 0.89 1.20  CALCIUM 9.4 9.1 9.5  GLUCOSE 293* 291* 215*    Imaging/Diagnostic Tests: R ankle XRay (7/18): No fracture or dislocation  is noted. Soft tissue swelling is seen  over lateral malleolus suggesting ligamentous injury.   R foot XRay (7/18): Soft tissue swelling and ulceration adjacent to the base of the  fifth metatarsal. No underlying osseous abnormality identified.   Lavon Paganini, MD 09/21/2013, 8:52 AM PGY-1, [place] Intern pager: [phone], text pages welcome

## 2013-09-21 NOTE — Progress Notes (Signed)
Inpatient Diabetes Program Recommendations  AACE/ADA: New Consensus Statement on Inpatient Glycemic Control (2013)  Target Ranges:  Prepandial:   less than 140 mg/dL      Peak postprandial:   less than 180 mg/dL (1-2 hours)      Critically ill patients:  140 - 180 mg/dL     Results for [name], [name] (MRN [id number]) as of 09/21/2013 11:23  Ref. Range 09/20/2013 07:47 09/20/2013 11:59 09/20/2013 16:44 09/20/2013 21:59  Glucose-Capillary Latest Range: 70-99 mg/dL 241 (H) 181 (H) 216 (H) 247 (H)    Results for [name], [name] (MRN [id number]) as of 09/21/2013 11:23  Ref. Range 09/21/2013 07:20  Glucose-Capillary Latest Range: 70-99 mg/dL 207 (H)     MD- Please consider the following in-hospital insulin adjustments:  1. Increase Lantus to 35 units QHS 2. Add Novolog Meal Coverage- Novolog 4 units tid with meals    Will follow Wyn Quaker RN, MSN, CDE Diabetes Coordinator Inpatient Diabetes Program Team Pager: [phone] (8a-10p)

## 2013-09-21 NOTE — Progress Notes (Signed)
Dear Doctor: This patient has been identified as a candidate for PICC for the following reason (s): drug pH or osmolality (causing phlebitis, infiltration in 24 hours) If you agree, please write an order for the indicated device. For any questions contact the Vascular Access Team at [phone] if no answer, please leave a message.  Thank you for supporting the early vascular access assessment program. Timmons, Christina M  

## 2013-09-21 NOTE — Discharge Summary (Signed)
[name] Discharge Summary  Patient name: [name] Medical record number: [id number] Date of birth: Jul 04, 1955 Age: 58 y.o. Gender: male Date of Admission: 09/19/2013  Date of Discharge: 09/28/2013 Admitting Physician: Lind Covert, MD  Primary Care Provider: Tawanna Sat, MD Consultants: Orthopedic surgery, Wound care, vascular surgery   Indication for Hospitalization: RLE Cellulitis  Discharge Diagnoses/Problem List:  Sepsis 2/2 RLE Cellulitis s/p R BKA AG Metabolic alkalosis [id number], uncontrolled Hyponatremia HTN Mild hypokalemia Mild normocytic anemia  Disposition: CIR  Discharge Condition: Stable  Discharge Exam: See daily progress note for 7/27  Brief Tucker Course:  [name] is a 58 y.o. male presenting with RLE Cellulitis. PMH is significant for T2DM, HTN, and Schizophrenia.  Sepsis 2/2 RLE cellulitis 2/2 diabetic ulcer s/p R BKA:  Pt was admitted for treatment of RLE cellulitis with IV Abx.  Cellulitis was thought to be 2/2 diabetic ulcer on R foot in setting of uncontrolled T2DM after stopping all medications. Pt started on IV Vancomycin and Zosyn to cover for MRSA and pseudomonas with diabetes.  Initial XRays showed no evidence of osseus involvement.  Patient continued to be febrile with leukocytosis on 7/20, so Orthopedic surgery was consulted for possible debridement.  They initially thought that he was stable enough for outpatient f/u.  On 7/21, he was still febrile and had leukocytosis, so Zosyn was d/c'd and Imipenem was added to his abx regimen.  Ortho was reconsulted on 7/22, after he continued to be septic despite being on IV Vanc and Imipenem, and they took him to the OR for I&D on 7/23.  MRI of R foot and ankle on 7/23 showed no definite osteomyelitis.  Went to OR again with Ortho on 7/25 for repeat I&D which converted to R BKA in OR. Pain well controlled with PO Oxy. D/c Vanc/Imipenem on 7/27 as source of infection  gone with BKA.  T2DM, uncontrolled: Patient initially presented with AG of 24, but there was no evidence of DKA as there was no acidosis on VBG, glucose was not severely elevated and patient was mentating clearly.  AG resolved to 15 after first dose of insulin. T2DM was managed with Lantus and moderate SSI (home dose of Lantus was listed as 50 units qhs, but initially started on 25 units qhs and eventually discharged on 20 units qhs).  Mild normocytic anemia: Mild normocytic anemia present on admission and stable throughout hospitalization, without intervention.    HTN: BP stable on admission on home Norvasc and Lisinopril.  D/c'd Norvasc 7/24 as patient reported generalized fatigue and weakness that he attributes to this medication.  Increased Lisinopril to 40 mg qday.  Electrolytes: Patient was hyponatremic to 132 on admission, which was likely hypovolemic hyponatremia in setting of decreased PO intake prior to admission and resolved with IV fluids. Pt mildly hypokalemic during admission, which resolved with oral repletion.    Patient had loose stools after receiving Miralax.  C diff PCR negative.  Other chronic medical conditions were stable and controlled with home regimens.  Issues for Follow Up:  1. Consider outpatient work-up for normocytic anemia 2. Patient will need ongoing PT and rehab after R BKA.  Will be fit for prosthetic in the future. 3. Follow-up on BP control off on Norvasc and on higher dose lisinopril  4. F/u insulin requirements after increased PO intake and BKA. Consider AM dosing of Lantus to decrease morning hypoglycemia.  Significant Procedures:  - I&D (7/23) - R BKA (7/25)  Significant  Labs and Imaging:   Recent Labs Lab 09/27/13 0519 09/27/13 1812 09/28/13 0646  WBC 14.5* 17.5* 13.5*  HGB 9.8* 9.3* 9.0*  HCT 31.0* 29.5* 27.9*  PLT 516* 587* 508*    Recent Labs Lab 09/24/13 0333 09/24/13 0402 09/25/13 0411 09/26/13 0342 09/27/13 0519 09/28/13 0646   NA 137  --  138 146 145 140  K 3.6*  --  3.5* 3.7 4.0 4.1  CL 97  --  97 102 105 101  CO2 28  --  27 31 28 28   GLUCOSE 143*  --  209* 48* 71 70  BUN 11  --  12 10 10 7   CREATININE 0.91  --  0.94 0.84 0.96 0.95  CALCIUM 9.0  --  8.6 9.1 8.5 8.2*  MG  --  1.9  --   --   --   --     Wound cultures (7/23): Multiple organisms, none predominant, no anaerobes isolated Blood Cultures (7/20): Negative  R ankle XRay (7/18): No fracture or dislocation is noted. Soft tissue swelling is seen  over lateral malleolus suggesting ligamentous injury.   R foot XRay (7/18): Soft tissue swelling and ulceration adjacent to the base of the  fifth metatarsal. No underlying osseous abnormality identified.  MRI R foot and ankle (7/22): 1. Abnormal diffuse subcutaneous and muscular edema in the foot.  Cellulitis and myositis not excluded. Deep in the subcutaneous  tissues and tracking along some of the distal lateral musculature in  the foot, there is infiltrative high T1 signal favoring hematoma.  Infectious phlegmon in this vicinity is not excluded although no  discrete drainable soft tissue abscess is seen deep within the foot.  No definite osteomyelitis.  2. Blistering and ulceration along the lateral forefoot.  3. Tenosynovitis and partial tearing of the peroneus longus.  Peroneus brevis tendinopathy.  4. Mild distal tibialis posterior tenosynovitis.  5. Low-level edema in the base of the fifth metatarsal, likely  reactive.   Results/Tests Pending at Time of Discharge: None  Discharge Medications:    Medication List    STOP taking these medications       ACCU-CHEK AVIVA PLUS test strip  Generic drug:  glucose blood     accu-chek soft touch lancets     amLODipine 10 MG tablet  Commonly known as:  NORVASC     brimonidine 0.15 % ophthalmic solution  Commonly known as:  ALPHAGAN  Replaced by:  brimonidine 0.2 % ophthalmic solution     [employer]S INSULIN SYRINGE 1CC/29G 29G X 1/2" 1 ML Misc   Generic drug:  INS SYRINGE/NEEDLE 1CC/29G     ibuprofen 200 MG tablet  Commonly known as:  ADVIL,MOTRIN     SOFT TOUCH LANCET DEVICE Misc      TAKE these medications       acetaminophen 325 MG tablet  Commonly known as:  TYLENOL  Take 1-2 tablets (325-650 mg total) by mouth every 4 (four) hours as needed for mild pain (or temp >/= 101 F).     acetaminophen 325 MG suppository  Commonly known as:  TYLENOL  Place 1-2 suppositories (325-650 mg total) rectally every 4 (four) hours as needed for mild pain (or temp >/= 101 F).     alum & mag hydroxide-simeth 200-200-20 MG/5ML suspension  Commonly known as:  MAALOX/MYLANTA  Take 15-30 mLs by mouth every 2 (two) hours as needed for indigestion.     brimonidine 0.2 % ophthalmic solution  Commonly known as:  [place]  1 drop into the left eye 2 (two) times daily.     dorzolamide-timolol 22.3-6.8 MG/ML ophthalmic solution  Commonly known as:  COSOPT  Place 1 drop into the left eye 2 (two) times daily.     gabapentin 300 MG capsule  Commonly known as:  NEURONTIN  Take 1 capsule (300 mg total) by mouth 3 (three) times daily.     guaiFENesin-dextromethorphan 100-10 MG/5ML syrup  Commonly known as:  ROBITUSSIN DM  Take 15 mLs by mouth every 4 (four) hours as needed for cough.     heparin 5000 UNIT/ML injection  Inject 1 mL (5,000 Units total) into the skin every 8 (eight) hours.     hydrALAZINE 20 MG/ML injection  Commonly known as:  APRESOLINE  Inject 0.5 mLs (10 mg total) into the vein every 2 (two) hours as needed (high blood pressure).     insulin aspart 100 UNIT/ML injection  Commonly known as:  novoLOG  Inject 0-15 Units into the skin 3 (three) times daily with meals.     insulin glargine 100 UNIT/ML injection  Commonly known as:  LANTUS  Inject 0.2 mLs (20 Units total) into the skin at bedtime.     lisinopril 40 MG tablet  Commonly known as:  PRINIVIL,ZESTRIL  Take 1 tablet (40 mg total) by mouth daily.  Start  taking on:  09/29/2013     metoprolol 1 MG/ML injection  Commonly known as:  LOPRESSOR  Inject 2-5 mLs (2-5 mg total) into the vein every 2 (two) hours as needed (sustained HR > 130. Call MD if ineffective.).     ondansetron 4 MG tablet  Commonly known as:  ZOFRAN  Take 1 tablet (4 mg total) by mouth every 6 (six) hours as needed for nausea.     ondansetron 4 MG/2ML Soln injection  Commonly known as:  ZOFRAN  Inject 2 mLs (4 mg total) into the vein every 6 (six) hours as needed for nausea or vomiting.     oxyCODONE 5 MG immediate release tablet  Commonly known as:  Oxy IR/ROXICODONE  Take 1-4 tablets (5-20 mg total) by mouth every 4 (four) hours as needed for moderate pain.     pantoprazole 40 MG tablet  Commonly known as:  PROTONIX  Take 1 tablet (40 mg total) by mouth daily.     phenol 1.4 % Liqd  Commonly known as:  CHLORASEPTIC  Use as directed 1 spray in the mouth or throat as needed for throat irritation / pain.     potassium chloride SA 20 MEQ tablet  Commonly known as:  K-DUR,KLOR-CON  Take 1 tablet (20 mEq total) by mouth 2 (two) times daily.     VISINE OP  Apply 1 drop to eye 2 (two) times daily. Only uses in left eye.        Discharge Instructions: Please refer to Patient Instructions section of EMR for full details.  Patient was counseled important signs and symptoms that should prompt return to medical care, changes in medications, dietary instructions, activity restrictions, and follow up appointments.   Follow-Up Appointments: Follow-up Information   Follow up with Newt Minion, MD. (asap, For wound re-check)    Specialty:  Orthopedic Surgery   Contact information:   [place] [address] [phone]       Lavon Paganini, MD 09/28/2013, 12:03 PM PGY-1, [place]

## 2013-09-21 NOTE — Progress Notes (Signed)
FMTS Attending Daily Note:  Annabell Sabal MD  [phone] pager  Family Practice pager:  [phone] I have seen and examined this patient and have reviewed their chart. I have discussed this patient with the resident. I agree with the resident's findings, assessment and care plan.  Additionally:  - Remains febrile with increasing leukocytosis despite broad-spectrum IV abx - concern is for abscess in foot. May need imaging to investigate extent of soft tissue infection. - Defer to Ortho.  Appreciate input - Agree with increase Lantus.  May need to back down once infection cleared  Alveda Reasons, MD 09/21/2013 1:49 PM

## 2013-09-21 NOTE — Consult Note (Signed)
ORTHOPAEDIC CONSULTATION  REQUESTING PHYSICIAN: Lind Covert, MD  Chief Complaint: right foot ulcer  HPI: [name] is a 58 y.o. male with uncontrolled DM who has had several wks of a right foot ulcer under his 5th MT. He has been on Abx in the hospital for 2days and reports that his redness has improved and he feels better over all. He does not have any pain.   Past Medical History  Diagnosis Date  . Diabetes mellitus   . Hypertension    History reviewed. No pertinent past surgical history. History   Social History  . Marital Status: Married    Spouse Name: N/A    Number of Children: N/A  . Years of Education: N/A   Social History Main Topics  . Smoking status: Never Smoker   . Smokeless tobacco: None  . Alcohol Use: No  . Drug Use: No  . Sexual Activity: None   Other Topics Concern  . None   Social History Narrative   ** Merged History Encounter **       Lives alone.    History reviewed. No pertinent family history. No Known Allergies Prior to Admission medications   Medication Sig Start Date End Date Taking? Authorizing Provider  ACCU-CHEK AVIVA PLUS test strip USE AS INSTRUCTED 10/24/12  Yes Tawanna Sat, MD  amLODipine (NORVASC) 10 MG tablet Take 1 tablet (10 mg total) by mouth daily. 06/08/13  Yes Tawanna Sat, MD  brimonidine (ALPHAGAN) 0.15 % ophthalmic solution Place 1 drop into the left eye 2 (two) times daily.   Yes Historical Provider, MD  dorzolamide-timolol (COSOPT) 22.3-6.8 MG/ML ophthalmic solution Place 1 drop into the left eye 2 (two) times daily.   Yes Historical Provider, MD  ibuprofen (ADVIL,MOTRIN) 200 MG tablet Take 200 mg by mouth every 6 (six) hours as needed.   Yes Historical Provider, MD  INS SYRINGE/NEEDLE 1CC/29G (CVS INSULIN SYRINGE 1CC/29G) 29G X 1/2" 1 ML MISC by Does not apply route.     Yes Historical Provider, MD  insulin aspart (NOVOLOG) 100 UNIT/ML injection Inject 5 Units into the skin 3 (three) times daily  before meals. Do not take if sugar is <150 07/07/13  Yes Tawanna Sat, MD  insulin glargine (LANTUS) 100 UNIT/ML injection Inject 0.5 mLs (50 Units total) into the skin at bedtime. 07/07/13  Yes Tawanna Sat, MD  Lancet Devices ([place]) [place] by Does not apply route.     Yes Historical Provider, MD  Lancets (ACCU-CHEK SOFT TOUCH) lancets 1 each by Other route as needed. Use as instructed    Yes Historical Provider, MD  lisinopril (PRINIVIL,ZESTRIL) 10 MG tablet Take 1 tablet (10 mg total) by mouth daily. 06/08/13  Yes Tawanna Sat, MD  Tetrahydrozoline HCl (VISINE OP) Apply 1 drop to eye 2 (two) times daily. Only uses in left eye.   Yes Historical Provider, MD   Dg Ankle 2 Views Right  09/19/2013   EXAM: RIGHT ANKLE - 2 VIEW  COMPARISON:  None.  FINDINGS: There is no evidence of fracture, dislocation, or joint effusion. Soft tissue swelling over lateral malleolus is noted suggesting ligamentous injury. Talar dome appears intact. Deformity of distal left tibia is noted suggesting old healed fracture.  IMPRESSION: No fracture or dislocation is noted. Soft tissue swelling is seen over lateral malleolus suggesting ligamentous injury.   Electronically Signed   By: Sabino Dick M.D.   On: 09/19/2013 18:18   Dg Foot 2 Views Right  09/19/2013   CLINICAL DATA:  Pain and swelling along dorsal aspect of foot and fifth metatarsal.  EXAM: RIGHT FOOT - 2 VIEW  COMPARISON:  None.  FINDINGS: Soft tissue swelling and ulceration is seen adjacent to the base of the fifth metatarsal. No evidence of osteolysis or periostitis. No evidence of fracture or other focal bone lesion.  IMPRESSION: Soft tissue swelling and ulceration adjacent to the base of the fifth metatarsal. No underlying osseous abnormality identified.   Electronically Signed   By: Earle Gell M.D.   On: 09/19/2013 18:18    Positive ROS: All other systems have been reviewed and were otherwise negative with the exception of those mentioned in the HPI  and as above.  Labs cbc  Recent Labs  09/20/13 0403 09/21/13 0424  WBC 23.4* 26.8*  HGB 11.0* 11.0*  HCT 32.8* 33.3*  PLT 377 396    Labs inflam No results found for this basename: ESR, CRP,  in the last 72 hours  Labs coag No results found for this basename: INR, PT, PTT,  in the last 72 hours   Recent Labs  09/20/13 0403 09/21/13 0424  NA 133* 137  K 4.5 4.2  CL 92* 96  CO2 25 26  GLUCOSE 291* 215*  BUN 16 18  CREATININE 0.89 1.20  CALCIUM 9.1 9.5    Physical Exam: Filed Vitals:   09/21/13 0550  BP: 110/63  Pulse: 81  Temp: 99.3 F (37.4 C)  Resp: 18   General: Alert, no acute distress Cardiovascular: No pedal edema Respiratory: No cyanosis, no use of accessory musculature GI: No organomegaly, abdomen is soft and non-tender Neurologic: Sensation intact distally Psychiatric: Patient is competent for consent with normal mood and affect Lymphatic: No axillary or cervical lymphadenopathy  MUSCULOSKELETAL:  RLE: he has a 2.5cm round ulcer beneath his 5th MT. There is minimal surrounding erythema and I cannot express any drainage, no palpable fluctuance. He has no obvious erythema of the leg, mild selling of the foot and distal leg. Decreased sensation to BLE, palpable DP pulses bilaterally.  Other extremities are atraumatic with painless ROM and NVI.  Assessment: Right foot ulcer  Plan: Continue IV abx and transition to PO. F/u with Dr. Sharol Given early next week as an outpatient or inpatient c/s if he is still here. Should patients condintion worsen will consider more urgent debridement but clinically it seems to be doing well on abx.  -continue wound care team management of his ulcer. VTE px: SCD's and chemical per the primary team.    [name], D, MD Cell [phone]   09/21/2013 8:46 AM

## 2013-09-22 DIAGNOSIS — F2089 Other schizophrenia: Secondary | ICD-10-CM

## 2013-09-22 LAB — BASIC METABOLIC PANEL
Anion gap: 12 (ref 5–15)
BUN: 22 mg/dL (ref 6–23)
CO2: 27 mEq/L (ref 19–32)
Calcium: 9.3 mg/dL (ref 8.4–10.5)
Chloride: 96 mEq/L (ref 96–112)
Creatinine, Ser: 1.23 mg/dL (ref 0.50–1.35)
GFR calc Af Amer: 74 mL/min — ABNORMAL LOW (ref >90)
GFR calc non Af Amer: 64 mL/min — ABNORMAL LOW (ref >90)
Glucose, Bld: 193 mg/dL — ABNORMAL HIGH (ref 70–99)
Potassium: 4.2 mEq/L (ref 3.7–5.3)
Sodium: 135 mEq/L — ABNORMAL LOW (ref 137–147)

## 2013-09-22 LAB — CBC
HCT: 32.9 % — ABNORMAL LOW (ref 39.0–52.0)
Hemoglobin: 10.9 g/dL — ABNORMAL LOW (ref 13.0–17.0)
MCH: 27 pg (ref 26.0–34.0)
MCHC: 33.1 g/dL (ref 30.0–36.0)
MCV: 81.4 fL (ref 78.0–100.0)
Platelets: 404 10*3/uL — ABNORMAL HIGH (ref 150–400)
RBC: 4.04 MIL/uL — ABNORMAL LOW (ref 4.22–5.81)
RDW: 12.6 % (ref 11.5–15.5)
WBC: 23.9 10*3/uL — ABNORMAL HIGH (ref 4.0–10.5)

## 2013-09-22 LAB — GLUCOSE, CAPILLARY
Glucose-Capillary: 270 mg/dL — ABNORMAL HIGH (ref 70–99)
Glucose-Capillary: 238 mg/dL — ABNORMAL HIGH (ref 70–99)
Glucose-Capillary: 201 mg/dL — ABNORMAL HIGH (ref 70–99)
Glucose-Capillary: 213 mg/dL — ABNORMAL HIGH (ref 70–99)

## 2013-09-22 LAB — VANCOMYCIN, TROUGH: Vancomycin Tr: 14 ug/mL (ref 10.0–20.0)

## 2013-09-22 MED ORDER — INSULIN GLARGINE 100 UNIT/ML ~~LOC~~ SOLN
35.0000 [IU] | Freq: Every day | SUBCUTANEOUS | Status: DC
  Administered 2013-09-22: 35 [IU] via SUBCUTANEOUS
  Filled 2013-09-22 (×2): qty 0.35

## 2013-09-22 MED ORDER — SENNA 8.6 MG PO TABS
1.0000 | ORAL_TABLET | Freq: Every day | ORAL | Status: DC
  Administered 2013-09-22 – 2013-09-24 (×3): 8.6 mg via ORAL
  Filled 2013-09-22 (×3): qty 1

## 2013-09-22 MED ORDER — SODIUM CHLORIDE 0.9 % IV SOLN
500.0000 mg | Freq: Four times a day (QID) | INTRAVENOUS | Status: DC
  Administered 2013-09-22 – 2013-09-26 (×17): 500 mg via INTRAVENOUS
  Filled 2013-09-22 (×20): qty 500

## 2013-09-22 NOTE — Progress Notes (Signed)
Family Medicine Teaching Service Daily Progress Note Intern Pager: [phone]  Patient name: [name] Medical record number: [id number] Date of birth: [birth year] Age: 58 y.o. Gender: male  Primary Care Provider: Tawanna Sat, MD Consultants: Ortho Code Status: Full code  Pt Overview and Major Events to Date:  7/19 Admit for IV Abx (Vanc/Zosyn) for RLE cellulitis and diabetic management 7/20 Febrile multiple times O/N -> Ortho c/s for possible debridement  Assessment and Plan:  [name] is a 58 y.o. male presenting with RLE Cellulitis. PMH is significant for T2DM, HTN, and Schizophrenia.   # RLE Cellulitis: Likely 2/2 diabetic ulcer in setting of uncontrolled T2 DM after stopping all medications. No evidence of osseous involvement.  - Febrile O/N again to 102.2, despite broad spectrum IV Abx. Consider BCx. - WBC 22.6>23.4>26.8>23.9. Continue to monitor and trend WBC.  - Continue Vanc/Zosyn. consider transition to PO Abx - Oxycodone IR 5mg  q4h prn for pain. - Continue Miralax and add senecot daily for constipation while taking narcotics. - elevate foot as much as possible  - Ortho recs: outpatient f/u with Dr. Sharol Given early next week for possible debridement.   # AG Metabolic alkalosis: Resolved. AG of 24 on admission, no evidence of DKA as no acidosis on VBG, glucose not severely elevated, and mentating clearly.  AG now closed at 63. - Continue to monitor with AM BMET.   # T2DM, uncontrolled: Last A1c 13.3. Not taking medications regularly at home. CBGs 181-270 O/N. - Increase Lantus dose from 30 qhs to 35 qhs - Continue Moderate SSI  - Continue to monitor CBGs.   # Hyponatremia: Na 132 >137>135. Likely hypovolemic hyponatremia in setting of decreased PO intake.  - Continue to monitor   # HTN: BP stable.  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 Pupil  non-reactive and dilated. Followed by ophtho as an outpatient.  - Continue home Cosopt and Alphagan drops   FEN/GI: Heart healthy/carb modified diet, KVO Prophylaxis: SQ Heparin  Disposition: Pending clinical improvement, transition to PO Abx  Subjective:  Pt reports that pain is fairly well controlled this AM.  He reports fever/chills O/N.  He still has not had a BM since admission. He is wondering why the orthopedic doctor came to see him yesterday if he does not have a broken bone.  Objective: Temp:  [99.5 F (37.5 C)-102.2 F (39 C)] 99.6 F (37.6 C) (07/21 0735) Pulse Rate:  [76-86] 86 (07/21 0503) Resp:  [16-20] 20 (07/21 0503) BP: (107-122)/(63-75) 122/75 mmHg (07/21 0503) SpO2:  [94 %-96 %] 96 % (07/21 0503) Physical Exam: General: Pleasant man laying in bed in NAD, shivering intermittently  Cardiovascular: RRR, no m/r/g, DP pulses 1+ b/l  Respiratory: CTAB, no w/r/c. Normal WOB  Abdomen: Soft, ND/NT, NABS, no HSM  Extremities: dressing in place over R foot, c/d/i. 2+ pitting edema on RLE to level of knee, RLE warmer than LLE. Neuro/Psych: Alert & O x3,  flat affect, no evidence of responding to internal stimuli   Laboratory:  Recent Labs Lab 09/20/13 0403 09/21/13 0424 09/22/13 0355  WBC 23.4* 26.8* 23.9*  HGB 11.0* 11.0* 10.9*  HCT 32.8* 33.3* 32.9*  PLT 377 396 404*    Recent Labs Lab 09/20/13 0403 09/21/13 0424 09/22/13 0355  NA 133* 137 135*  K 4.5 4.2 4.2  CL 92* 96 96  CO2 25 26  27  BUN 16 18 22   CREATININE 0.89 1.20 1.23  CALCIUM 9.1 9.5 9.3  GLUCOSE 291* 215* 193*    Imaging/Diagnostic Tests: R ankle XRay (7/18): No fracture or dislocation is noted. Soft tissue swelling is seen  over lateral malleolus suggesting ligamentous injury.   R foot XRay (7/18): Soft tissue swelling and ulceration adjacent to the base of the  fifth metatarsal. No underlying osseous abnormality identified.   Lavon Paganini, MD 09/22/2013, 8:24 AM PGY-1, [place] Intern pager: [phone], text pages welcome

## 2013-09-22 NOTE — Progress Notes (Signed)
FMTS Attending Daily Note:  Annabell Sabal MD  [phone] pager  Family Practice pager:  [phone] I have discussed this patient with the resident Dr. Brita Romp.  I agree with their findings, assessment, and care plan.  Still febrile and essentially no change in leukocytosis.  Obtain imaging of ulcer to assess for extent of abscess

## 2013-09-22 NOTE — Progress Notes (Signed)
ANTIBIOTIC CONSULT NOTE - FOLLOW UP  Pharmacy Consult for Vancomycin Indication: Diabetic foot uler/cellulitis  No Known Allergies  Patient Measurements: Height: 6' (182.9 cm) Weight: 216 lb 14.4 oz (98.385 kg) IBW/kg (Calculated) : 77.6 Adjusted Body Weight:    Vital Signs: Temp: 99.4 F (37.4 C) (07/21 1420) Temp src: Oral (07/21 1420) BP: 119/71 mmHg (07/21 1420) Pulse Rate: 87 (07/21 1420) Intake/Output from previous day: 07/20 0701 - 07/21 0700 In: 1540 [P.O.:1290; IV Piggyback:250] Out: 800 [Urine:800] Intake/Output from this shift: Total I/O In: 240 [P.O.:240] Out: 300 [Urine:300]  Labs:  Recent Labs  09/20/13 0403 09/21/13 0424 09/22/13 0355  WBC 23.4* 26.8* 23.9*  HGB 11.0* 11.0* 10.9*  PLT 377 396 404*  CREATININE 0.89 1.20 1.23   Estimated Creatinine Clearance: 81.5 ml/min (by C-G formula based on Cr of 1.23).  Recent Labs  09/22/13 1500  VANCOTROUGH 14.0     Microbiology: No results found for this or any previous visit (from the past 720 hour(s)).  Anti-infectives   Start     Dose/Rate Route Frequency Ordered Stop   09/22/13 1200  imipenem-cilastatin (PRIMAXIN) 500 mg in sodium chloride 0.9 % 100 mL IVPB     500 mg 200 mL/hr over 30 Minutes Intravenous 4 times per day 09/22/13 1104     09/20/13 0800  vancomycin (VANCOCIN) IVPB 1000 mg/200 mL premix     1,000 mg 200 mL/hr over 60 Minutes Intravenous Every 8 hours 09/20/13 0037     09/20/13 0600  piperacillin-tazobactam (ZOSYN) IVPB 3.375 g  Status:  Discontinued     3.375 g 12.5 mL/hr over 240 Minutes Intravenous Every 8 hours 09/20/13 0037 09/22/13 1104   09/20/13 0045  vancomycin (VANCOCIN) IVPB 1000 mg/200 mL premix     1,000 mg 200 mL/hr over 60 Minutes Intravenous  Once 09/20/13 0037 09/20/13 0244   09/20/13 0045  piperacillin-tazobactam (ZOSYN) IVPB 3.375 g     3.375 g 100 mL/hr over 30 Minutes Intravenous  Once 09/20/13 0037 09/20/13 0215   09/19/13 2130  cefTRIAXone (ROCEPHIN)  injection 1 g  Status:  Discontinued     1 g Intramuscular  Once 09/19/13 2123 09/19/13 2123   09/19/13 2130  cefTRIAXone (ROCEPHIN) 1 g in dextrose 5 % 50 mL IVPB     1 g 100 mL/hr over 30 Minutes Intravenous  Once 09/19/13 2123 09/19/13 2346      Assessment: Vanco/zosyn started for diabetic foot ulcer with cellulitis. Tmax 102.9, Tc 99.4 wbc 23.9. Ortho rec cont IV Abx with change to po at discharge. Vanco trough 14.  Zosyn 7/19>7/21 vanco 7/19>> Ceftriaxone 1g x 1 on 7/18 Primaxin 7/21>>  7/21 BC x 2: pending  Goal of Therapy:  Vancomycin trough level 10-15 mcg/ml  Plan:  Continue Vancomycin 1g Iv q8 hrs.  Crystal S. Alford Highland, PharmD, BCPS Clinical Staff Pharmacist Pager [phone]  [name] 09/22/2013,3:53 PM

## 2013-09-23 ENCOUNTER — Inpatient Hospital Stay (HOSPITAL_COMMUNITY): Payer: Medicaid Other

## 2013-09-23 DIAGNOSIS — E669 Obesity, unspecified: Secondary | ICD-10-CM

## 2013-09-23 LAB — CBC
HCT: 35 % — ABNORMAL LOW (ref 39.0–52.0)
Hemoglobin: 11.5 g/dL — ABNORMAL LOW (ref 13.0–17.0)
MCH: 27.3 pg (ref 26.0–34.0)
MCHC: 32.9 g/dL (ref 30.0–36.0)
MCV: 82.9 fL (ref 78.0–100.0)
Platelets: 452 10*3/uL — ABNORMAL HIGH (ref 150–400)
RBC: 4.22 MIL/uL (ref 4.22–5.81)
RDW: 12.9 % (ref 11.5–15.5)
WBC: 24.4 10*3/uL — ABNORMAL HIGH (ref 4.0–10.5)

## 2013-09-23 LAB — SEDIMENTATION RATE: Sed Rate: 114 mm/hr — ABNORMAL HIGH (ref 0–16)

## 2013-09-23 LAB — GLUCOSE, CAPILLARY
Glucose-Capillary: 233 mg/dL — ABNORMAL HIGH (ref 70–99)
Glucose-Capillary: 192 mg/dL — ABNORMAL HIGH (ref 70–99)
Glucose-Capillary: 223 mg/dL — ABNORMAL HIGH (ref 70–99)
Glucose-Capillary: 165 mg/dL — ABNORMAL HIGH (ref 70–99)

## 2013-09-23 LAB — BASIC METABOLIC PANEL
Anion gap: 13 (ref 5–15)
BUN: 14 mg/dL (ref 6–23)
CO2: 29 mEq/L (ref 19–32)
Calcium: 9.4 mg/dL (ref 8.4–10.5)
Chloride: 95 mEq/L — ABNORMAL LOW (ref 96–112)
Creatinine, Ser: 0.99 mg/dL (ref 0.50–1.35)
GFR calc Af Amer: >90 mL/min (ref >90)
GFR calc non Af Amer: 90 mL/min — ABNORMAL LOW (ref >90)
Glucose, Bld: 191 mg/dL — ABNORMAL HIGH (ref 70–99)
Potassium: 4.1 mEq/L (ref 3.7–5.3)
Sodium: 137 mEq/L (ref 137–147)

## 2013-09-23 MED ORDER — INSULIN GLARGINE 100 UNIT/ML ~~LOC~~ SOLN
40.0000 [IU] | Freq: Every day | SUBCUTANEOUS | Status: DC
  Administered 2013-09-23 – 2013-09-25 (×3): 40 [IU] via SUBCUTANEOUS
  Filled 2013-09-23 (×4): qty 0.4

## 2013-09-23 NOTE — Progress Notes (Signed)
Family Medicine PCP Social Note  I have seen Mr. Hoppe today. He does not have any complaints currently. I appreciate the excellent care that is being provided by the [facility] team and Orthopedics.  Tawanna Sat, MD 09/23/2013, 1:56 PM PGY-2, [place]

## 2013-09-23 NOTE — Progress Notes (Signed)
FMTS Attending Daily Note:  Annabell Sabal MD  [phone] pager  Family Practice pager:  [phone] I have seen and examined this patient and have reviewed their chart. I have discussed this patient with the resident. I agree with the resident's findings, assessment and care plan.  Additionally:  - Still febrile with no improvement in leukocytosis.   - Now with dusky appearing skin that has extended along lateral aspect of Right foot beyond the bandage.  Not yet to toes.   - MRI of foot pending.  - on Vanc/primaxin.   - Await Ortho rec's  Alveda Reasons, MD 09/23/2013 9:59 AM

## 2013-09-23 NOTE — Progress Notes (Signed)
Family Medicine Teaching Service Daily Progress Note Intern Pager: [phone]  Patient name: [name] Medical record number: [id number] Date of birth: 03/30/1955 Age: 58 y.o. Gender: male  Primary Care Provider: Tawanna Sat, MD Consultants: Ortho Code Status: Full code  Pt Overview and Major Events to Date:  7/19 Admit for IV Abx (Vanc/Zosyn) for RLE cellulitis and diabetic management 7/20 Febrile multiple times O/N -> Ortho c/s for possible debridement  Assessment and Plan:  [name] is a 58 y.o. male presenting with RLE Cellulitis. PMH is significant for T2DM, HTN, and Schizophrenia.   # Sepsis 2/2 RLE Cellulitis: Likely 2/2 diabetic ulcer in setting of uncontrolled T2 DM after stopping all medications. No evidence of osseous involvement.  - Febrile O/N again to 102.4, despite broad spectrum IV Abx.  - BCx x2 pending - WBC 22.6>23.4>26.8>23.9>24.4. Continue to monitor and trend WBC.  - Continue Vanc/Imipenem - Oxycodone IR 5mg  q4h prn for pain. - Continue Miralax and senecot daily for constipation while taking narcotics. - elevate foot as much as possible  - MRI pending, discuss again with Ortho after resulted. May warrant debridement as likely has poor perfusion of abx to tissue, given fever and leukocytosis in setting of broad spectrum abx.   # T2DM, uncontrolled: Last A1c 13.3. Not taking medications regularly at home. CBGs 201-238 O/N. - Increase Lantus dose from 35 qhs to 40 qhs - Continue Moderate SSI  - Continue to monitor CBGs.   # Hyponatremia: Resolved. Na 137 today. Likely hypovolemic hyponatremia in setting of decreased PO intake.  - Continue to monitor   # Mild normocytic anemia:  Stable. Hgb 11.5.  Continue to monitor. - Consider further w/u as outpatient  # HTN: BP stable.  - Continue home Norvasc 10mg  qday and Lisinopril 10mg  qday  - Continue to monitor   # AG Metabolic alkalosis: Resolved. AG of 24 on admission, no evidence of DKA as no  acidosis on VBG, glucose not severely elevated, and mentating clearly.  AG now closed at 70. - Continue to monitor with AM BMET.   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 Pupil non-reactive and dilated. Followed by ophtho as an outpatient.  - Continue home Cosopt and Alphagan drops   FEN/GI: Heart healthy/carb modified diet, KVO Prophylaxis: SQ Heparin  Disposition: Pending clinical improvement, transition to PO Abx  Subjective:  Pt reports that pain is fairly well controlled this AM.  He reports fever/chills O/N.  He  Is feeling poorly overall, explained to him that this is the infection. He had BM yesterday after senna added.  Objective: Temp:  [99.4 F (37.4 C)-102.4 F (39.1 C)] 102.4 F (39.1 C) (07/22 0611) Pulse Rate:  [86-87] 87 (07/22 0611) Resp:  [16-18] 16 (07/22 0611) BP: (119-157)/(71-104) 157/104 mmHg (07/22 0611) SpO2:  [94 %-98 %] 95 % (07/22 5638) Physical Exam: General: Pleasant man laying in bed in NAD, shivering intermittently  Cardiovascular: RRR, no m/r/g, DP pulses 1+ b/l  Respiratory: CTAB, no w/r/c. Normal WOB  Abdomen: Soft, ND/NT, NABS, no HSM  Extremities: dressing in place over R foot, c/d/i. 2+ pitting edema on RLE to level of knee, RLE warmer than LLE. Neuro/Psych: Alert & O x3,  flat affect, no evidence of responding to internal stimuli   Laboratory:  Recent Labs Lab 09/21/13 0424 09/22/13 0355 09/23/13 0535  WBC 26.8* 23.9* 24.4*  HGB 11.0* 10.9* 11.5*  HCT 33.3* 32.9* 35.0*  PLT 396 404* 452*  Recent Labs Lab 09/21/13 0424 09/22/13 0355 09/23/13 0535  NA 137 135* 137  K 4.2 4.2 4.1  CL 96 96 95*  CO2 26 27 29   BUN 18 22 14   CREATININE 1.20 1.23 0.99  CALCIUM 9.5 9.3 9.4  GLUCOSE 215* 193* 191*    Imaging/Diagnostic Tests: R ankle XRay (7/18): No fracture or dislocation is noted. Soft tissue swelling is seen  over lateral malleolus suggesting ligamentous injury.   R foot XRay (7/18):  Soft tissue swelling and ulceration adjacent to the base of the  fifth metatarsal. No underlying osseous abnormality identified.   Lavon Paganini, MD 09/23/2013, 9:56 AM PGY-1, [place] Intern pager: [phone], text pages welcome

## 2013-09-23 NOTE — Plan of Care (Signed)
Problem: Phase I Progression Outcomes Goal: Pain controlled with appropriate interventions Outcome: Not Progressing Patient refuses to take pain medication.

## 2013-09-24 ENCOUNTER — Encounter (HOSPITAL_COMMUNITY): Payer: Self-pay | Admitting: Anesthesiology

## 2013-09-24 ENCOUNTER — Encounter (HOSPITAL_COMMUNITY)
Admission: EM | Disposition: A | Discharge status: Rehabilitation Facility | Payer: Self-pay | Source: Home / Self Care | Attending: Family Medicine

## 2013-09-24 ENCOUNTER — Inpatient Hospital Stay (HOSPITAL_COMMUNITY): Payer: Medicaid Other | Admitting: Anesthesiology

## 2013-09-24 ENCOUNTER — Encounter (HOSPITAL_COMMUNITY): Payer: Medicaid Other | Admitting: Anesthesiology

## 2013-09-24 DIAGNOSIS — M79609 Pain in unspecified limb: Secondary | ICD-10-CM

## 2013-09-24 HISTORY — PX: I&D EXTREMITY: SHX5045

## 2013-09-24 LAB — GLUCOSE, CAPILLARY
Glucose-Capillary: 143 mg/dL — ABNORMAL HIGH (ref 70–99)
Glucose-Capillary: 138 mg/dL — ABNORMAL HIGH (ref 70–99)
Glucose-Capillary: 127 mg/dL — ABNORMAL HIGH (ref 70–99)
Glucose-Capillary: 107 mg/dL — ABNORMAL HIGH (ref 70–99)
Glucose-Capillary: 143 mg/dL — ABNORMAL HIGH (ref 70–99)
Glucose-Capillary: 240 mg/dL — ABNORMAL HIGH (ref 70–99)

## 2013-09-24 LAB — BASIC METABOLIC PANEL
Anion gap: 12 (ref 5–15)
BUN: 11 mg/dL (ref 6–23)
CO2: 28 mEq/L (ref 19–32)
Calcium: 9 mg/dL (ref 8.4–10.5)
Chloride: 97 mEq/L (ref 96–112)
Creatinine, Ser: 0.91 mg/dL (ref 0.50–1.35)
GFR calc Af Amer: >90 mL/min (ref >90)
GFR calc non Af Amer: >90 mL/min (ref >90)
Glucose, Bld: 143 mg/dL — ABNORMAL HIGH (ref 70–99)
Potassium: 3.6 mEq/L — ABNORMAL LOW (ref 3.7–5.3)
Sodium: 137 mEq/L (ref 137–147)

## 2013-09-24 LAB — CBC
HCT: 31.3 % — ABNORMAL LOW (ref 39.0–52.0)
Hemoglobin: 10.3 g/dL — ABNORMAL LOW (ref 13.0–17.0)
MCH: 26.8 pg (ref 26.0–34.0)
MCHC: 32.9 g/dL (ref 30.0–36.0)
MCV: 81.5 fL (ref 78.0–100.0)
Platelets: 460 10*3/uL — ABNORMAL HIGH (ref 150–400)
RBC: 3.84 MIL/uL — ABNORMAL LOW (ref 4.22–5.81)
RDW: 12.8 % (ref 11.5–15.5)
WBC: 24.4 10*3/uL — ABNORMAL HIGH (ref 4.0–10.5)

## 2013-09-24 LAB — SURGICAL PCR SCREEN
MRSA, PCR: NEGATIVE
Staphylococcus aureus: NEGATIVE

## 2013-09-24 LAB — C-REACTIVE PROTEIN: CRP: 37.3 mg/dL — ABNORMAL HIGH (ref <0.60)

## 2013-09-24 LAB — MAGNESIUM: Magnesium: 1.9 mg/dL (ref 1.5–2.5)

## 2013-09-24 SURGERY — IRRIGATION AND DEBRIDEMENT EXTREMITY
Anesthesia: General | Site: Foot | Laterality: Right

## 2013-09-24 MED ORDER — ARTIFICIAL TEARS OP OINT
TOPICAL_OINTMENT | OPHTHALMIC | Status: AC
  Filled 2013-09-24: qty 3.5

## 2013-09-24 MED ORDER — LACTATED RINGERS IV SOLN
INTRAVENOUS | Status: DC | PRN
  Administered 2013-09-24 (×2): via INTRAVENOUS

## 2013-09-24 MED ORDER — MIDAZOLAM HCL 5 MG/5ML IJ SOLN
INTRAMUSCULAR | Status: DC | PRN
  Administered 2013-09-24 (×2): 1 mg via INTRAVENOUS

## 2013-09-24 MED ORDER — HYDROMORPHONE HCL PF 1 MG/ML IJ SOLN
INTRAMUSCULAR | Status: AC
  Filled 2013-09-24: qty 1

## 2013-09-24 MED ORDER — OXYCODONE HCL 5 MG/5ML PO SOLN: 5.0000 mg | Freq: Once | ORAL | Status: DC | PRN

## 2013-09-24 MED ORDER — OXYCODONE HCL 5 MG PO TABS
10.0000 mg | ORAL_TABLET | ORAL | Status: DC | PRN
  Administered 2013-09-25 (×2): 10 mg via ORAL
  Filled 2013-09-24 (×2): qty 2

## 2013-09-24 MED ORDER — MIDAZOLAM HCL 2 MG/2ML IJ SOLN
INTRAMUSCULAR | Status: AC
  Filled 2013-09-24: qty 2

## 2013-09-24 MED ORDER — ONDANSETRON HCL 4 MG/2ML IJ SOLN: 4.0000 mg | Freq: Once | INTRAMUSCULAR | Status: DC | PRN

## 2013-09-24 MED ORDER — OXYCODONE HCL 5 MG PO TABS: 5.0000 mg | ORAL_TABLET | Freq: Once | ORAL | Status: DC | PRN

## 2013-09-24 MED ORDER — FENTANYL CITRATE 0.05 MG/ML IJ SOLN
INTRAMUSCULAR | Status: DC | PRN
  Administered 2013-09-24: 50 ug via INTRAVENOUS
  Administered 2013-09-24: 25 ug via INTRAVENOUS
  Administered 2013-09-24: 50 ug via INTRAVENOUS

## 2013-09-24 MED ORDER — FENTANYL CITRATE 0.05 MG/ML IJ SOLN
INTRAMUSCULAR | Status: AC
  Filled 2013-09-24: qty 5

## 2013-09-24 MED ORDER — LIDOCAINE HCL (CARDIAC) 20 MG/ML IV SOLN
INTRAVENOUS | Status: DC | PRN
  Administered 2013-09-24: 100 mg via INTRAVENOUS

## 2013-09-24 MED ORDER — ONDANSETRON HCL 4 MG/2ML IJ SOLN
INTRAMUSCULAR | Status: DC | PRN
  Administered 2013-09-24: 4 mg via INTRAVENOUS

## 2013-09-24 MED ORDER — PROPOFOL 10 MG/ML IV BOLUS
INTRAVENOUS | Status: AC
  Filled 2013-09-24: qty 20

## 2013-09-24 MED ORDER — ONDANSETRON HCL 4 MG/2ML IJ SOLN
INTRAMUSCULAR | Status: AC
  Filled 2013-09-24: qty 2

## 2013-09-24 MED ORDER — PROPOFOL 10 MG/ML IV BOLUS
INTRAVENOUS | Status: DC | PRN
  Administered 2013-09-24: 160 mg via INTRAVENOUS

## 2013-09-24 MED ORDER — CEFAZOLIN SODIUM-DEXTROSE 2-3 GM-% IV SOLR
INTRAVENOUS | Status: DC | PRN
  Administered 2013-09-24: 2 g via INTRAVENOUS

## 2013-09-24 MED ORDER — LIDOCAINE HCL (CARDIAC) 20 MG/ML IV SOLN
INTRAVENOUS | Status: AC
  Filled 2013-09-24: qty 5

## 2013-09-24 MED ORDER — POTASSIUM CHLORIDE CRYS ER 10 MEQ PO TBCR
10.0000 meq | EXTENDED_RELEASE_TABLET | ORAL | Status: AC
  Administered 2013-09-24: 10 meq via ORAL
  Filled 2013-09-24 (×2): qty 1

## 2013-09-24 MED ORDER — DEXTROSE 5 % IV SOLN
INTRAVENOUS | Status: DC | PRN
  Administered 2013-09-24: 15:00:00 via INTRAVENOUS

## 2013-09-24 MED ORDER — HYDROMORPHONE HCL PF 1 MG/ML IJ SOLN: 0.2500 mg | INTRAMUSCULAR | Status: DC | PRN

## 2013-09-24 MED ORDER — LACTATED RINGERS IV SOLN
INTRAVENOUS | Status: DC
  Administered 2013-09-24: 10 mL/h via INTRAVENOUS

## 2013-09-24 MED ORDER — SODIUM CHLORIDE 0.9 % IR SOLN
Status: DC | PRN
  Administered 2013-09-24: 6000 mL

## 2013-09-24 SURGICAL SUPPLY — 44 items
BANDAGE ELASTIC 4 VELCRO ST LF (GAUZE/BANDAGES/DRESSINGS) ×2 IMPLANT
BANDAGE ELASTIC 6 VELCRO ST LF (GAUZE/BANDAGES/DRESSINGS) ×1 IMPLANT
BANDAGE GAUZE ELAST BULKY 4 IN (GAUZE/BANDAGES/DRESSINGS) ×2 IMPLANT
BLADE SURG 10 STRL SS (BLADE) ×2 IMPLANT
BNDG COHESIVE 4X5 TAN STRL (GAUZE/BANDAGES/DRESSINGS) ×1 IMPLANT
BOOTCOVER CLEANROOM LRG (PROTECTIVE WEAR) ×4 IMPLANT
COVER SURGICAL LIGHT HANDLE (MISCELLANEOUS) ×2 IMPLANT
CUFF TOURNIQUET SINGLE 34IN LL (TOURNIQUET CUFF) IMPLANT
DRAPE IMP U-DRAPE 54X76 (DRAPES) ×1 IMPLANT
DURAPREP 26ML APPLICATOR (WOUND CARE) ×2 IMPLANT
ELECT REM PT RETURN 9FT ADLT (ELECTROSURGICAL) ×2
ELECTRODE REM PT RTRN 9FT ADLT (ELECTROSURGICAL) IMPLANT
EVACUATOR 1/8 PVC DRAIN (DRAIN) IMPLANT
FACESHIELD WRAPAROUND (MASK) IMPLANT
FACESHIELD WRAPAROUND OR TEAM (MASK) ×3 IMPLANT
GAUZE XEROFORM 1X8 LF (GAUZE/BANDAGES/DRESSINGS) ×1 IMPLANT
GLOVE BIO SURGEON STRL SZ8 (GLOVE) ×2 IMPLANT
GLOVE ORTHO TXT STRL SZ7.5 (GLOVE) ×4 IMPLANT
GOWN STRL REUS W/ TWL LRG LVL3 (GOWN DISPOSABLE) ×3 IMPLANT
GOWN STRL REUS W/TWL 2XL LVL3 (GOWN DISPOSABLE) ×2 IMPLANT
GOWN STRL REUS W/TWL LRG LVL3 (GOWN DISPOSABLE) ×8
HANDPIECE INTERPULSE COAX TIP (DISPOSABLE)
KIT BASIN OR (CUSTOM PROCEDURE TRAY) ×2 IMPLANT
KIT ROOM TURNOVER OR (KITS) ×2 IMPLANT
MANIFOLD NEPTUNE II (INSTRUMENTS) ×2 IMPLANT
NS IRRIG 1000ML POUR BTL (IV SOLUTION) ×2 IMPLANT
PACK ORTHO EXTREMITY (CUSTOM PROCEDURE TRAY) ×2 IMPLANT
PAD ARMBOARD 7.5X6 YLW CONV (MISCELLANEOUS) ×4 IMPLANT
PENCIL BUTTON HOLSTER BLD 10FT (ELECTRODE) ×1 IMPLANT
SET CYSTO W/LG BORE CLAMP LF (SET/KITS/TRAYS/PACK) ×1 IMPLANT
SET HNDPC FAN SPRY TIP SCT (DISPOSABLE) IMPLANT
SPONGE GAUZE 4X4 12PLY (GAUZE/BANDAGES/DRESSINGS) ×2 IMPLANT
SPONGE LAP 18X18 X RAY DECT (DISPOSABLE) ×2 IMPLANT
STOCKINETTE IMPERVIOUS 9X36 MD (GAUZE/BANDAGES/DRESSINGS) ×2 IMPLANT
SUT ETHILON 2 0 PSLX (SUTURE) ×1 IMPLANT
SUT ETHILON 3 0 PS 1 (SUTURE) IMPLANT
TOWEL OR 17X24 6PK STRL BLUE (TOWEL DISPOSABLE) ×2 IMPLANT
TOWEL OR 17X26 10 PK STRL BLUE (TOWEL DISPOSABLE) ×2 IMPLANT
TOWEL OR NON WOVEN STRL DISP B (DISPOSABLE) ×2 IMPLANT
TUBE ANAEROBIC SPECIMEN COL (MISCELLANEOUS) ×1 IMPLANT
TUBE CONNECTING 12X1/4 (SUCTIONS) ×2 IMPLANT
UNDERPAD 30X30 INCONTINENT (UNDERPADS AND DIAPERS) ×2 IMPLANT
WATER STERILE IRR 1000ML POUR (IV SOLUTION) ×2 IMPLANT
YANKAUER SUCT BULB TIP NO VENT (SUCTIONS) ×2 IMPLANT

## 2013-09-24 NOTE — Progress Notes (Signed)
I participated in the care of this patient and agree with the above history, physical and evaluation. I performed a review of the history and a physical exam as detailed   Timothy Daniel Murphy MD  

## 2013-09-24 NOTE — H&P (View-Only) (Signed)
I participated in the care of this patient and agree with the above history, physical and evaluation. I performed a review of the history and a physical exam as detailed   Timothy Daniel Murphy MD  

## 2013-09-24 NOTE — Transfer of Care (Signed)
Immediate Anesthesia Transfer of Care Note  Patient: [name]  Procedure(s) Performed: Procedure(s): IRRIGATION AND DEBRIDEMENT RIGHT FOOT ULCER (Right)  Patient Location: PACU  Anesthesia Type:General  Level of Consciousness: awake and patient cooperative  Airway & Oxygen Therapy: Patient Spontanous Breathing and Patient connected to nasal cannula oxygen  Post-op Assessment: Report given to PACU RN, Post -op Vital signs reviewed and stable and Patient moving all extremities  Post vital signs: Reviewed and stable  Complications: No apparent anesthesia complications

## 2013-09-24 NOTE — Progress Notes (Signed)
Patient ID: [name], male   DOB: Apr 12, 1955, 58 y.o.   MRN: [id number]     Subjective:  Patient reports pain as mild to moderate.  Patient complains of pain in the lower leg from the knee down to the toes  Objective:   VITALS:   Filed Vitals:   09/23/13 1517 09/23/13 2324 09/24/13 0030 09/24/13 0520  BP: 138/79 147/85  140/83  Pulse: 88 97  79  Temp: 100.6 F (38.1 C) 102.7 F (39.3 C) 100.6 F (38.1 C) 100.2 F (37.9 C)  TempSrc: Oral Oral Oral Oral  Resp: 17 18  18   Height:      Weight:      SpO2: 97% 96%  97%    ABD soft Sensation intact distally Patients foot was blistering and the wound had an odor  Palp. Pulses foot is warn to the touch 2+ pitting edema right side only Blistering was blood filled and was from the dorsal lateral to the plantar aspect of the foot   Lab Results  Component Value Date   WBC 24.4* 09/24/2013   HGB 10.3* 09/24/2013   HCT 31.3* 09/24/2013   MCV 81.5 09/24/2013   PLT 460* 09/24/2013     Assessment/Plan:     Active Problems:   DM (diabetes mellitus), type 2, uncontrolled   Obesity, unspecified   HYPERTENSION, BENIGN SYSTEMIC   Noncompliance with medications   Cellulitis   Patient may need to have surgical intervention for this will have Dr Percell Miller consult  Plan for NPO after [place] for I&D of right foot wound  [name] 09/24/2013, 7:54 AM   Edmonia Lynch MD [phone]

## 2013-09-24 NOTE — Interval H&P Note (Signed)
History and Physical Interval Note:  09/24/2013 2:32 PM  [name]  has presented today for surgery, with the diagnosis of right foot ulcer  The various methods of treatment have been discussed with the patient and family. After consideration of risks, benefits and other options for treatment, the patient has consented to  Procedure(s): IRRIGATION AND DEBRIDEMENT RIGHT FOOT ULCER (Right) as a surgical intervention .  The patient's history has been reviewed, patient examined, no change in status, stable for surgery.  I have reviewed the patient's chart and labs.  Questions were answered to the patient's satisfaction.     MURPHY, TIMOTHY, D

## 2013-09-24 NOTE — Consult Note (Signed)
[facility] [place]  Patient name: [name] MRN: [id number] DOB: 04/09/1955 Sex: male  REASON FOR CONSULT: Diabetic foot infection. Consult from [facility].  HPI: [name] is a 58 y.o. male who was admitted on 09/19/2013 with right foot pain. He was found to have a significant diabetic foot infection. Orthopedics was consulted and performed I&D today. I examined the foot as they were operating on his right foot. He has a deep space infection in the right foot which is quite extensive extending from the wound On the lateral aspect of the foot across to the medial aspect of the foot. This extended back towards the heel.  The patient is unaware of any injury to the foot. He states that he has had the wound for approximately a month. He does describe some calf claudication although he states that this occurs at 2 miles. He denies any history of rest pain. He states that he has had some fevers at home.  His risk factors for vascular disease include diabetes, hypertension, and a remote history of tobacco use.  Past Medical History  Diagnosis Date  . Diabetes mellitus   . Hypertension   His past medical history significant for type 2 diabetes, hypertension, and schizophrenia.  History reviewed. No pertinent family history. He states that both his mother and sister had heart disease at a young age.  SOCIAL HISTORY: History  Substance Use Topics  . Smoking status: Never Smoker   . Smokeless tobacco: Not on file  . Alcohol Use: No   He quit smoking 20 years ago.  No Known Allergies  REVIEW OF SYSTEMS: Valu.Nieves ] denotes positive finding; [  ] denotes negative finding CARDIOVASCULAR:  [ ]  chest pain   [ ]  chest pressure   [ ]  palpitations   [ ]  orthopnea   [ ]  dyspnea on exertion   Valu.Nieves ] claudication   [ ]  rest pain   [ ]  DVT   [ ]  phlebitis PULMONARY:   [ ]  productive cough   [ ]  asthma   [ ]  wheezing NEUROLOGIC:   [ ]  weakness  [ ]  paresthesias  [ ]   aphasia  [ ]  amaurosis  [ ]  dizziness HEMATOLOGIC:   [ ]  bleeding problems   [ ]  clotting disorders MUSCULOSKELETAL:  Valu.Nieves ] joint pain   [ ]  joint swelling [ ]  leg swelling GASTROINTESTINAL: [ ]   blood in stool  [ ]   hematemesis GENITOURINARY:  [ ]   dysuria  [ ]   hematuria PSYCHIATRIC:  [ ]  history of major depression INTEGUMENTARY:  [ ]  rashes  Valu.Nieves ] ulcers CONSTITUTIONAL:  [X]  fever   [ ]  chills  PHYSICAL EXAM: Filed Vitals:   09/24/13 0030 09/24/13 0520 09/24/13 0902 09/24/13 1304  BP:  140/83  142/81  Pulse:  79  80  Temp: 100.6 F (38.1 C) 100.2 F (37.9 C) 98.7 F (37.1 C) 100.2 F (37.9 C)  TempSrc: Oral Oral  Oral  Resp:  18  16  Height:      Weight:      SpO2:  97%  95%   Body mass index is 29.41 kg/(m^2). GENERAL: The patient is a well-nourished male, in no acute distress. The vital signs are documented above. CARDIOVASCULAR: There is a regular rate and rhythm. I do not detect carotid bruits. On the right side, which is the symptomatic side, he has a palpable femoral, popliteal, and anterior tibial pulse. He has a biphasic posterior  tibial signal on the right. On the left side, he has a palpable femoral, popliteal, and dorsalis pedis pulse. He has a monophasic left posterior tibial signal. PULMONARY: There is good air exchange bilaterally without wheezing or rales. ABDOMEN: Soft and non-tender with normal pitched bowel sounds.  MUSCULOSKELETAL: He has a dressing on the right foot. NEUROLOGIC: No focal weakness or paresthesias are detected. SKIN: The ulcer is over the metatarsal head on the lateral aspect of his right foot.  DATA:  Lab Results  Component Value Date   WBC 24.4* 09/24/2013   HGB 10.3* 09/24/2013   HCT 31.3* 09/24/2013   MCV 81.5 09/24/2013   PLT 460* 09/24/2013   Lab Results  Component Value Date   NA 137 09/24/2013   K 3.6* 09/24/2013   CL 97 09/24/2013   CO2 28 09/24/2013   Lab Results  Component Value Date   CREATININE 0.91 09/24/2013   No results  found for this basename: INR, PROTIME   Lab Results  Component Value Date   HGBA1C 13.3 08/05/2013   CBG (last 3)   Recent Labs  09/24/13 0737 09/24/13 1251 09/24/13 1347  GLUCAP 143* 138* 127*   ABI's and toe pressure pending.   Plain x-rays does not show evidence of osteomyelitis.  MEDICAL ISSUES:  DIABETIC FOOT INFECTION: This patient presents with a diabetic foot infection and has undergone debridement by orthopedics. He is on intravenous vancomycin. I would consider adding Zosyn pending his culture results. From a vascular standpoint, he has a palpable right anterior tibial pulse and appears to have adequate circulation to the right foot. His ABIs and toe pressures are pending. However, given that he has a palpable anterior tibial pulse and biphasic posterior tibial signal at this point I would not recommend arteriography. I will follow up once his ABI's are complete.   [facility] [place] Beeper: [phone]

## 2013-09-24 NOTE — ED Provider Notes (Signed)
I saw and evaluated the patient, reviewed the resident's note and I agree with the findings and plan.   EKG Interpretation None      57yM with foot pain and fever. On exam he has ~2.5cm ulcer beneath his 5th MT. There is minimal surrounding erythema, no palpable fluctuance but small amount of pus express from wound. He has no obvious erythema of the leg, mild selling of the foot and distal leg. Decreased sensation to BLE, palpable DP pulses bilaterally. With comorbidities, I feel needs admit for IV abx and surgical consultation for possible I&D.    Virgel Manifold, MD 09/24/13 1141

## 2013-09-24 NOTE — Progress Notes (Signed)
FMTS Attending Daily Note:  Jake Sabal MD  [phone] pager  Family Practice pager:  [phone] I have discussed this patient with the resident Dr. Brita Romp.  I agree with their findings, assessment, and care plan.  - For debridement today.   - Continue IV abx

## 2013-09-24 NOTE — Progress Notes (Signed)
Family Medicine Teaching Service Daily Progress Note Intern Pager: [phone]  Patient name: [name] Medical record number: [id number] Date of birth: [birth year] Age: 58 y.o. Gender: male  Primary Care Provider: Tawanna Sat, MD Consultants: Ortho Code Status: Full code  Pt Overview and Major Events to Date:  7/19 Admit for IV Abx (Vanc/Zosyn) for RLE cellulitis and diabetic management 7/20 Febrile multiple times O/N -> Ortho c/s for possible debridement 7/21 still febrile, switch Zosyn to Imipenem 7/22 Ortho re-consulted as showing no improvement on IV abx 7/23 MRI foot with no definite osteo, still febrile O/N  Assessment and Plan:  [name] is a 58 y.o. male presenting with RLE Cellulitis. PMH is significant for T2DM, HTN, and Schizophrenia.   # Sepsis 2/2 RLE Cellulitis: Likely 2/2 diabetic ulcer in setting of uncontrolled T2 DM after stopping all medications. No evidence of osseous involvement.  - Febrile O/N again to 102.7, despite broad spectrum IV Abx.  - BCx x2 pending - WBC 22.6>23.4>26.8>23.9>24.4>24.4. Continue to monitor and trend WBC.  - Continue Vanc/Imipenem - Increase Oxycodone IR to 10mg  q4h prn for pain. - Continue Miralax and d/c senecot (pt reports loose stool x1 O/N) daily for constipation while taking narcotics. - elevate foot as much as possible  - MRI with no definite osteo - Plan for OR with Ortho 7/24 for I&D. NPO after MN.   # T2DM, uncontrolled: Last A1c 13.3. Not taking medications regularly at home. CBGs 143-223 O/N. - Continue Lantus 40 qhs and Moderate SSI  - Continue to monitor CBGs.   # Hyponatremia: Resolved. Na 137 today. Likely hypovolemic hyponatremia in setting of decreased PO intake.  - Continue to monitor   # Mild Hypokalemia: K3.6. - Replete orally with KDur 20 mEq - Continue to monitor  # Mild normocytic anemia:  Stable. Hgb 10.3.  Continue to monitor. - Consider further w/u as outpatient  # HTN: BP stable.  -  Continue home Norvasc 10mg  qday and Lisinopril 10mg  qday  - Continue to monitor   # AG Metabolic alkalosis: Resolved. AG of 24 on admission, no evidence of DKA as no acidosis on VBG, glucose not severely elevated, and mentating clearly.  AG now closed at 27. - Continue to monitor with AM BMET.   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 Pupil non-reactive and dilated. Followed by ophtho as an outpatient.  - Continue home Cosopt and Alphagan drops   FEN/GI: Heart healthy/Carb modified diet, NPO after midnight, KVO Prophylaxis: SQ Heparin  Disposition: Pending clinical improvement, transition to PO Abx  Subjective:  Pt reports that pain is not as well controlled as it has been.  He reports fever/chills O/N.  He is feeling poorly overall still. He had loose BM O/N x1.  Objective: Temp:  [98.7 F (37.1 C)-102.7 F (39.3 C)] 98.7 F (37.1 C) (07/23 0902) Pulse Rate:  [79-97] 79 (07/23 0520) Resp:  [17-18] 18 (07/23 0520) BP: (138-147)/(79-85) 140/83 mmHg (07/23 0520) SpO2:  [96 %-97 %] 97 % (07/23 0520) Physical Exam: General: Pleasant man laying in bed in NAD, shivering intermittently  Cardiovascular: RRR, no m/r/g, DP pulses 1+ b/l  Respiratory: CTAB, no w/r/c. Normal WOB  Abdomen: Soft, ND/NT, NABS, no HSM  Extremities: dressing in place over R foot, c/d/i. 2+ pitting edema on RLE to level of knee, RLE warmer than LLE. Neuro/Psych: Alert & O x3,  flat affect, no evidence of responding to internal stimuli   Laboratory:  Recent Labs  Lab 09/22/13 0355 09/23/13 0535 09/24/13 0333  WBC 23.9* 24.4* 24.4*  HGB 10.9* 11.5* 10.3*  HCT 32.9* 35.0* 31.3*  PLT 404* 452* 460*    Recent Labs Lab 09/22/13 0355 09/23/13 0535 09/24/13 0333  NA 135* 137 137  K 4.2 4.1 3.6*  CL 96 95* 97  CO2 27 29 28   BUN 22 14 11   CREATININE 1.23 0.99 0.91  CALCIUM 9.3 9.4 9.0  GLUCOSE 193* 191* 143*    Imaging/Diagnostic Tests: R ankle XRay (7/18): No  fracture or dislocation is noted. Soft tissue swelling is seen  over lateral malleolus suggesting ligamentous injury.   R foot XRay (7/18): Soft tissue swelling and ulceration adjacent to the base of the  fifth metatarsal. No underlying osseous abnormality identified.  MRI R foot and ankle (7/22): 1. Abnormal diffuse subcutaneous and muscular edema in the foot.  Cellulitis and myositis not excluded. Deep in the subcutaneous  tissues and tracking along some of the distal lateral musculature in  the foot, there is infiltrative high T1 signal favoring hematoma.  Infectious phlegmon in this vicinity is not excluded although no  discrete drainable soft tissue abscess is seen deep within the foot.  No definite osteomyelitis.  2. Blistering and ulceration along the lateral forefoot.  3. Tenosynovitis and partial tearing of the peroneus longus.  Peroneus brevis tendinopathy.  4. Mild distal tibialis posterior tenosynovitis.  5. Low-level edema in the base of the fifth metatarsal, likely  reactive.    Lavon Paganini, MD 09/24/2013, 9:13 AM PGY-1, [place] Intern pager: [phone], text pages welcome

## 2013-09-24 NOTE — H&P (View-Only) (Signed)
Patient ID: [name], male   DOB: 10-Feb-1956, 58 y.o.   MRN: [id number]     Subjective:  Patient reports pain as mild to moderate.  Patient complains of pain in the lower leg from the knee down to the toes  Objective:   VITALS:   Filed Vitals:   09/23/13 1517 09/23/13 2324 09/24/13 0030 09/24/13 0520  BP: 138/79 147/85  140/83  Pulse: 88 97  79  Temp: 100.6 F (38.1 C) 102.7 F (39.3 C) 100.6 F (38.1 C) 100.2 F (37.9 C)  TempSrc: Oral Oral Oral Oral  Resp: 17 18  18   Height:      Weight:      SpO2: 97% 96%  97%    ABD soft Sensation intact distally Patients foot was blistering and the wound had an odor  Palp. Pulses foot is warn to the touch 2+ pitting edema right side only Blistering was blood filled and was from the dorsal lateral to the plantar aspect of the foot   Lab Results  Component Value Date   WBC 24.4* 09/24/2013   HGB 10.3* 09/24/2013   HCT 31.3* 09/24/2013   MCV 81.5 09/24/2013   PLT 460* 09/24/2013     Assessment/Plan:     Active Problems:   DM (diabetes mellitus), type 2, uncontrolled   Obesity, unspecified   HYPERTENSION, BENIGN SYSTEMIC   Noncompliance with medications   Cellulitis   Patient may need to have surgical intervention for this will have Dr Percell Miller consult  Plan for NPO after [place] for I&D of right foot wound  [name] 09/24/2013, 7:54 AM   Edmonia Lynch MD [phone]

## 2013-09-24 NOTE — Anesthesia Preprocedure Evaluation (Addendum)
Anesthesia Evaluation  Patient identified by MRN, date of birth, ID band Patient awake    Reviewed: Allergy & Precautions, H&P , NPO status , Patient's Chart, lab work & pertinent test results  Airway Mallampati: II TM Distance: >3 FB Neck ROM: Full    Dental  (+) Edentulous Upper, Loose, Dental Advisory Given,    Pulmonary  breath sounds clear to auscultation        Cardiovascular hypertension, Pt. 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  Reproductive/Obstetrics                         Anesthesia Physical Anesthesia Plan  ASA: III  Anesthesia Plan: General   Post-op Pain Management:    Induction: Intravenous  Airway Management Planned: LMA  Additional Equipment:   Intra-op Plan:   Post-operative Plan: Extubation in OR  Informed Consent: I have reviewed the patients History and Physical, chart, labs and discussed the procedure including the risks, benefits and alternatives for the proposed anesthesia with the patient or authorized representative who has indicated his/her understanding and acceptance.   Dental advisory given  Plan Discussed with: CRNA and Anesthesiologist  Anesthesia Plan Comments: (R. Foot ulcer Type 2 DM glucose 127 Hypertension  Plan GA with LMA  Roberts Gaudy)        Anesthesia Quick Evaluation

## 2013-09-24 NOTE — Op Note (Signed)
09/19/2013 - 09/24/2013  3:43 PM  PATIENT:  [name]    PRE-OPERATIVE DIAGNOSIS:  right foot ulcer  POST-OPERATIVE DIAGNOSIS:  Same  PROCEDURE:  IRRIGATION AND DEBRIDEMENT RIGHT FOOT ULCER  SURGEON:  Edmonia Lynch, D, MD  ASSISTANT: Joya Gaskins OPA  ANESTHESIA:   Gen  PREOPERATIVE INDICATIONS:  [name] is a  58 y.o. male with a diagnosis of right foot ulcer who failed conservative measures and elected for surgical management.    The risks benefits and alternatives were discussed with the patient preoperatively including but not limited to the risks of infection, bleeding, nerve injury, cardiopulmonary complications, the need for revision surgery, among others, and the patient was willing to proceed.  OPERATIVE IMPLANTS: none  OPERATIVE FINDINGS: purulent and necrotic tissue tracking above and below the 5th MT  BLOOD LOSS: 40  COMPLICATIONS: none  TOURNIQUET TIME: none  OPERATIVE PROCEDURE:  Patient was identified in the preoperative holding area and site was marked by me He was transported to the operating theater and placed on the table in supine position taking care to pad all bony prominences. After a preincinduction time out anesthesia was induced. The right lower extremity was prepped and draped in normal sterile fashion and a pre-incision timeout was performed. He received ancef after culture and was on vanc and zosyn for preoperative antibiotics.   I remove the fibrinous Over his ulcer at the head of his fifth metatarsal it was roughly dime size I was able to express some purulent and necrotic smell foul smelling tissue I sent this for culture as well some deeper samples of it. I then probed the hole and it did track inferior to the metatarsals and across almost to the medial side of the foot. I expanded the entrance into this portion and squeeze fair amount of fluid out of it I then incised the skin proximal and distal to the ulcer roughly 1/2 cm each  way.  I then probed the superior to the fifth metatarsal and it did track over the dorsum of his foot of roughly to the level of the second metatarsal this is the area where the skin had blistered. The skin not completely broken down so as intact. Express purulent material from here as well I extended the entrance into this cavity to allow to continue training. I then thoroughly irrigated both cavities with a total of 6 saline 6 L of saline. I curetted both cavities and felt that open of all 4. After that there irrigation I then packed 4 x 4 the leaving a fair amount of it up returning from the wound one superior one inferior to the foot and then placed a sterile dressing. I elected to leave the wound open to continue to drain if needed. His and taken the PACU in stable condition.  POST OPERATIVE PLAN: continue abx per FM team, WBAT, dvt px: ambulate and chemical per the [facility] team.   He will be posted for repeat I&D Saturday if needed.

## 2013-09-24 NOTE — Anesthesia Procedure Notes (Signed)
Procedure Name: LMA Insertion Date/Time: 09/24/2013 3:05 PM Performed by: Williemae Area B Pre-anesthesia Checklist: Patient identified, Emergency Drugs available, Suction available and Patient being monitored Patient Re-evaluated:Patient Re-evaluated prior to inductionOxygen Delivery Method: Circle system utilized Preoxygenation: Pre-oxygenation with 100% oxygen Intubation Type: IV induction Ventilation: Mask ventilation without difficulty LMA: LMA inserted LMA Size: 5.0 Number of attempts: 1 Placement Confirmation: positive ETCO2 and breath sounds checked- equal and bilateral Tube secured with: Tape (taped across cheeks; gauze roll b/t teeth and upper gum) Dental Injury: Teeth and Oropharynx as per pre-operative assessment

## 2013-09-25 ENCOUNTER — Encounter (HOSPITAL_COMMUNITY): Payer: Self-pay | Admitting: Orthopedic Surgery

## 2013-09-25 LAB — CBC
HCT: 31.4 % — ABNORMAL LOW (ref 39.0–52.0)
Hemoglobin: 10.1 g/dL — ABNORMAL LOW (ref 13.0–17.0)
MCH: 26.5 pg (ref 26.0–34.0)
MCHC: 32.2 g/dL (ref 30.0–36.0)
MCV: 82.4 fL (ref 78.0–100.0)
Platelets: 460 10*3/uL — ABNORMAL HIGH (ref 150–400)
RBC: 3.81 MIL/uL — ABNORMAL LOW (ref 4.22–5.81)
RDW: 12.9 % (ref 11.5–15.5)
WBC: 21.9 10*3/uL — ABNORMAL HIGH (ref 4.0–10.5)

## 2013-09-25 LAB — GLUCOSE, CAPILLARY
Glucose-Capillary: 183 mg/dL — ABNORMAL HIGH (ref 70–99)
Glucose-Capillary: 210 mg/dL — ABNORMAL HIGH (ref 70–99)
Glucose-Capillary: 135 mg/dL — ABNORMAL HIGH (ref 70–99)
Glucose-Capillary: 99 mg/dL (ref 70–99)

## 2013-09-25 LAB — BASIC METABOLIC PANEL
Anion gap: 14 (ref 5–15)
BUN: 12 mg/dL (ref 6–23)
CO2: 27 mEq/L (ref 19–32)
Calcium: 8.6 mg/dL (ref 8.4–10.5)
Chloride: 97 mEq/L (ref 96–112)
Creatinine, Ser: 0.94 mg/dL (ref 0.50–1.35)
GFR calc Af Amer: >90 mL/min (ref >90)
GFR calc non Af Amer: >90 mL/min (ref >90)
Glucose, Bld: 209 mg/dL — ABNORMAL HIGH (ref 70–99)
Potassium: 3.5 mEq/L — ABNORMAL LOW (ref 3.7–5.3)
Sodium: 138 mEq/L (ref 137–147)

## 2013-09-25 MED ORDER — ACETAMINOPHEN 325 MG PO TABS
650.0000 mg | ORAL_TABLET | Freq: Four times a day (QID) | ORAL | Status: DC
  Administered 2013-09-25 – 2013-09-26 (×4): 650 mg via ORAL
  Filled 2013-09-25 (×4): qty 2

## 2013-09-25 MED ORDER — LISINOPRIL 20 MG PO TABS
20.0000 mg | ORAL_TABLET | Freq: Every day | ORAL | Status: DC
  Administered 2013-09-26 – 2013-09-28 (×3): 20 mg via ORAL
  Filled 2013-09-25 (×3): qty 1

## 2013-09-25 MED ORDER — POTASSIUM CHLORIDE CRYS ER 20 MEQ PO TBCR
20.0000 meq | EXTENDED_RELEASE_TABLET | Freq: Two times a day (BID) | ORAL | Status: DC
  Administered 2013-09-25 – 2013-09-28 (×7): 20 meq via ORAL
  Filled 2013-09-25 (×8): qty 1

## 2013-09-25 NOTE — Progress Notes (Signed)
VASCULAR SURGERY  ABIs and toe pressures still pending. I will check back Monday. If any problems over the weekend Dr. Donnetta Hutching was on call.  [name], MD, [facility] [phone] 09/25/2013

## 2013-09-25 NOTE — Progress Notes (Signed)
Results for DELAND, SLOCUMB (MRN [id number]) as of 09/25/2013 13:31  Ref. Range 09/24/2013 17:59 09/24/2013 21:45 09/25/2013 08:03 09/25/2013 12:06  Glucose-Capillary Latest Range: 70-99 mg/dL 143 (H) 240 (H) 183 (H) 210 (H)  If postprandial CBGs continue greater than 180 mg/dl, may want to consider adding Novolog 3-4 units TID with meals as meal coverage and if patient eats at least 50% of meal.  Continue Lantus 40 units daily and Novolog MODERATE correction scale TID & HS.  Harvel Ricks RN BSN CDE

## 2013-09-25 NOTE — Progress Notes (Signed)
Patient ID: [name], male   DOB: [birth year], 58 y.o.   MRN: [id number]     Subjective:  Patient reports pain as mild to moderate.  Patient states that he is feeling some better and that his foot is not as painful.  Objective:   VITALS:   Filed Vitals:   09/25/13 0300 09/25/13 0608 09/25/13 0959 09/25/13 1408  BP:  126/70 124/80 124/74  Pulse:  73 70 70  Temp: 98.7 F (37.1 C) 98.6 F (37 C) 97.9 F (36.6 C) 98.9 F (37.2 C)  TempSrc:  Oral Oral Oral  Resp:  20 18 16   Height:      Weight:      SpO2:  97% 95% 96%    ABD soft Dorsiflexion/Plantar flexion intact Incision: dressing C/D/I and no drainage Sensation at baseline he has neuropathy  Ehl/fhl firing. No vascular studies back yet.   Lab Results  Component Value Date   WBC 21.9* 09/25/2013   HGB 10.1* 09/25/2013   HCT 31.4* 09/25/2013   MCV 82.4 09/25/2013   PLT 460* 09/25/2013     Assessment/Plan: 1 Day Post-Op   Active Problems:   DM (diabetes mellitus), type 2, uncontrolled   Obesity, unspecified   HYPERTENSION, BENIGN SYSTEMIC   Noncompliance with medications   Cellulitis   Advance diet Up with therapy May need second I&D over the weekend or may need BKA for definitive treatment  Will need results vascular studies Continue plan per medicine Dr Marcelino Scot aware of the issues and may be able to do second washout this weekend if needed   [name] 09/25/2013, 4:09 PM   Edmonia Lynch MD [phone]

## 2013-09-25 NOTE — Anesthesia Postprocedure Evaluation (Signed)
Anesthesia Post Note  Patient: [name]  Procedure(s) Performed: Procedure(s) (LRB): IRRIGATION AND DEBRIDEMENT RIGHT FOOT ULCER (Right)  Anesthesia type: general  Patient location: PACU  Post pain: Pain level controlled  Post assessment: Patient's Cardiovascular Status Stable  Last Vitals:  Filed Vitals:   09/25/13 1408  BP: 124/74  Pulse: 70  Temp: 37.2 C  Resp: 16    Post vital signs: Reviewed and stable  Level of consciousness: sedated  Complications: No apparent anesthesia complications

## 2013-09-25 NOTE — Progress Notes (Signed)
ANTIBIOTIC CONSULT NOTE - FOLLOW UP  Pharmacy Consult for Vancomycin and Primaxin Indication: Diabetic foot ulcer with cellulitis  No Known Allergies  Patient Measurements: Height: 6' (182.9 cm) Weight: 216 lb 14.4 oz (98.385 kg) IBW/kg (Calculated) : 77.6 Adjusted Body Weight:   Vital Signs: Temp: 98.6 F (37 C) (07/24 5400) Temp src: Oral (07/24 0608) BP: 126/70 mmHg (07/24 0608) Pulse Rate: 73 (07/24 0608) Intake/Output from previous day: 07/23 0701 - 07/24 0700 In: 2290 [P.O.:840; I.V.:1450] Out: 705 [Urine:700; Blood:5] Intake/Output from this shift:    Labs:  Recent Labs  09/23/13 0535 09/24/13 0333 09/25/13 0411  WBC 24.4* 24.4* 21.9*  HGB 11.5* 10.3* 10.1*  PLT 452* 460* 460*  CREATININE 0.99 0.91 0.94   Estimated Creatinine Clearance: 106.6 ml/min (by C-G formula based on Cr of 0.94).  Recent Labs  09/22/13 1500  VANCOTROUGH 14.0     Microbiology: Recent Results (from the past 720 hour(s))  CULTURE, BLOOD (ROUTINE X 2)     Status: None   Collection Time    09/22/13 12:44 PM      Result Value Ref Range Status   Specimen Description BLOOD RIGHT HAND   Final   Special Requests BOTTLES DRAWN AEROBIC AND ANAEROBIC 10CC   Final   Culture  Setup Time     Final   Value: 09/22/2013 17:12     Performed at [facility]   Culture     Final   Value:        BLOOD CULTURE RECEIVED NO GROWTH TO DATE CULTURE WILL BE HELD FOR 5 DAYS BEFORE ISSUING A FINAL NEGATIVE REPORT     Performed at [facility]   Report Status PENDING   Incomplete  CULTURE, BLOOD (ROUTINE X 2)     Status: None   Collection Time    09/22/13 12:47 PM      Result Value Ref Range Status   Specimen Description BLOOD LEFT ANTECUBITAL   Final   Special Requests BOTTLES DRAWN AEROBIC AND ANAEROBIC 10CC   Final   Culture  Setup Time     Final   Value: 09/22/2013 17:11     Performed at [facility]   Culture     Final   Value:        BLOOD CULTURE RECEIVED NO GROWTH TO  DATE CULTURE WILL BE HELD FOR 5 DAYS BEFORE ISSUING A FINAL NEGATIVE REPORT     Performed at [facility]   Report Status PENDING   Incomplete  SURGICAL PCR SCREEN     Status: None   Collection Time    09/24/13  1:08 PM      Result Value Ref Range Status   MRSA, PCR NEGATIVE  NEGATIVE Final   Staphylococcus aureus NEGATIVE  NEGATIVE Final   Comment:            The Xpert SA Assay (FDA     approved for NASAL specimens     in patients over 38 years of age),     is one component of     a comprehensive surveillance     program.  Test performance has     been validated by [employer] for patients greater     than or equal to 55 year old.     It is not intended     to diagnose infection nor to     guide or monitor treatment.  ANAEROBIC CULTURE  Status: None   Collection Time    09/24/13  3:41 PM      Result Value Ref Range Status   Specimen Description ABSCESS FOOT RIGHT   Final   Special Requests PT ON VANCO PRIMAXIN   Final   Gram Stain     Final   Value: NO WBC SEEN     NO SQUAMOUS EPITHELIAL CELLS SEEN     ABUNDANT GRAM POSITIVE COCCI     IN PAIRS ABUNDANT GRAM VARIABLE ROD     Performed at [facility]   Culture PENDING   Incomplete   Report Status PENDING   Incomplete  CULTURE, ROUTINE-ABSCESS     Status: None   Collection Time    09/24/13  3:41 PM      Result Value Ref Range Status   Specimen Description ABSCESS FOOT RIGHT   Final   Special Requests PT ON VANCO PRIMAXIN   Final   Gram Stain     Final   Value: NO WBC SEEN     NO SQUAMOUS EPITHELIAL CELLS SEEN     ABUNDANT GRAM POSITIVE COCCI     IN PAIRS ABUNDANT GRAM VARIABLE ROD     Performed at [facility]     Final   Value: Culture reincubated for better growth     Performed at [facility]   Report Status PENDING   Incomplete  ANAEROBIC CULTURE     Status: None   Collection Time    09/24/13  3:48 PM      Result Value Ref Range Status   Specimen Description  TISSUE FOOT RIGHT   Final   Special Requests PT ON VANCO PRIMAXIN   Final   Gram Stain     Final   Value: MODERATE WBC PRESENT,BOTH PMN AND MONONUCLEAR     MODERATE GRAM POSITIVE COCCI     IN PAIRS FEW GRAM VARIABLE ROD     Performed at [facility]   Culture PENDING   Incomplete   Report Status PENDING   Incomplete  TISSUE CULTURE     Status: None   Collection Time    09/24/13  3:48 PM      Result Value Ref Range Status   Specimen Description TISSUE RIGHT FOOT   Final   Special Requests PT ON VANC PRIMAXIN   Final   Gram Stain     Final   Value: MODERATE WBC PRESENT,BOTH PMN AND MONONUCLEAR     ABUNDANT GRAM POSITIVE COCCI     IN PAIRS FEW GRAM VARIABLE ROD     Performed at [facility]     Final   Value: NO GROWTH 1 DAY     Performed at [facility]   Report Status PENDING   Incomplete    Anti-infectives   Start     Dose/Rate Route Frequency Ordered Stop   09/22/13 1200  imipenem-cilastatin (PRIMAXIN) 500 mg in sodium chloride 0.9 % 100 mL IVPB     500 mg 200 mL/hr over 30 Minutes Intravenous 4 times per day 09/22/13 1104     09/20/13 0800  vancomycin (VANCOCIN) IVPB 1000 mg/200 mL premix     1,000 mg 200 mL/hr over 60 Minutes Intravenous Every 8 hours 09/20/13 0037     09/20/13 0600  piperacillin-tazobactam (ZOSYN) IVPB 3.375 g  Status:  Discontinued     3.375 g 12.5 mL/hr over 240 Minutes Intravenous Every 8 hours 09/20/13 0037  09/22/13 1104   09/20/13 0045  vancomycin (VANCOCIN) IVPB 1000 mg/200 mL premix     1,000 mg 200 mL/hr over 60 Minutes Intravenous  Once 09/20/13 0037 09/20/13 0244   09/20/13 0045  piperacillin-tazobactam (ZOSYN) IVPB 3.375 g     3.375 g 100 mL/hr over 30 Minutes Intravenous  Once 09/20/13 0037 09/20/13 0215   09/19/13 2130  cefTRIAXone (ROCEPHIN) injection 1 g  Status:  Discontinued     1 g Intramuscular  Once 09/19/13 2123 09/19/13 2123   09/19/13 2130  cefTRIAXone (ROCEPHIN) 1 g in dextrose 5 % 50 mL IVPB      1 g 100 mL/hr over 30 Minutes Intravenous  Once 09/19/13 2123 09/19/13 2346      Assessment: 58yo male with foot ulcer, cellulitis, and continued temps.  Renal fxn has been stable.  Tissue and abscess cultures are (+)GPC and Gram-variable rods.  Blood cultures are NTD.  MRI (-) osteomyelitis.  Vanc trough was within goal on 7/21.  Plan for repeat  I&D on 7/25.  Goal of Therapy:  Vancomycin trough level 10-15 mcg/ml  Plan:  1-  Continue Vancomycin 1000mg  IV q8 2-  Primaxin 500mg  IV q6 per MD 3-  F/U cultures, renal function, fever curve, and weekly Vanc Troughs  Gracy Bruins, PharmD [facility]

## 2013-09-25 NOTE — Progress Notes (Signed)
FMTS Attending Daily Note:  Annabell Sabal MD  [phone] pager  Family Practice pager:  [phone] I have discussed this patient with the resident Dr. Brita Romp.  I agree with their findings, assessment, and care plan.

## 2013-09-25 NOTE — Clinical Documentation Improvement (Signed)
Possible Clinical Conditions?  Sepsis Present on Admission due to RLE  Cellulitis 2/2 diabetic ulcer in a setting of uncontrolled T2 DM Other Condition  Cannot clinically Determine   Clinical Indicators:   Per 7/22 - 09/24/13 MD progress notes: Sepsis 2/2 RLE Cellulitis: Likely 2/2 diabetic ulcer in setting of uncontrolled T2 DM after stopping all medications. No evidence of osseous involvement. - Febrile O/N again to 102.4, despite broad spectrum IV Abx. - BCx x2 pending  - WBC 22.6>23.4>26.8>23.9>24.4. Continue to monitor and trend WBC. - Continue Vanc/Imipenem  - Oxycodone IR 5mg  q4h prn for pain.  - Continue Miralax and senecot daily for constipation while taking narcotics.  - elevate foot as much as possible  - MRI pending, discuss again with Ortho after resulted. May warrant debridement as likely has poor perfusion of abx to tissue, given fever and leukocytosis in setting of broad spectrum abx.     Thank You, Serena Colonel ,RN Clinical Documentation Specialist:  [employer]

## 2013-09-25 NOTE — Progress Notes (Signed)
Pt has spiked a fever again, prn tylenol given, cool washcloth on forehead.  Will monitor.

## 2013-09-25 NOTE — Progress Notes (Signed)
Family Medicine Teaching Service Daily Progress Note Intern Pager: [phone]  Patient name: [name] Medical record number: [id number] Date of birth: [birth year] Age: 58 y.o. Gender: male  Primary Care Provider: Tawanna Sat, MD Consultants: Ortho, Vascular surgery Code Status: Full code  Pt Overview and Major Events to Date:  7/19 Admit for IV Abx (Vanc/Zosyn) for RLE cellulitis and diabetic management 7/20 Febrile multiple times O/N -> Ortho c/s for possible debridement 7/21 still febrile, switch Zosyn to Imipenem 7/22 Ortho re-consulted as showing no improvement on IV abx 7/23 MRI foot with no definite osteo, still febrile O/N, OR for I&D with Ortho  Assessment and Plan:  [name] is a 58 y.o. male presenting with RLE Cellulitis. PMH is significant for T2DM, HTN, and Schizophrenia.   # Sepsis 2/2 RLE Cellulitis: Likely 2/2 diabetic ulcer in setting of uncontrolled T2 DM after stopping all medications. No evidence of definite osteo on MRI. S/p I&D with Ortho 7/23. - Febrile O/N again to 103.1, despite broad spectrum IV Abx. - BCx (7/21) NGTD x3d - Wound cultures (7/23) pending - abundant GPC and gram variable rods on gram stain - WBC 22.6>...>24.4>21.9. Continue to monitor and trend WBC.  - Continue Vanc/Imipenem - Continue Oxycodone IR to 10mg  q4h prn and start scheduled tylenol for pain. - elevate foot as much as possible - ABIs pending - Will likely return to OR 7/25 - Ortho and Vascular surgery following, appreciate recs    # T2DM, uncontrolled: Last A1c 13.3. Not taking medications regularly at home. CBGs 107-240 O/N. - Continue Lantus 40 qhs and Moderate SSI  - Continue to monitor CBGs.   # Loose stools: Likely related to miralax, but will check a C. Diff. - C diff PCR pending - D/c miralax.  # Hyponatremia: Resolved. Na 138 today. Likely hypovolemic hyponatremia in setting of decreased PO intake. Continue to monitor   # Mild Hypokalemia: Stable. K 3.5. -  Replete orally with KDur 20 mEq - Continue to monitor  # Mild normocytic anemia:  Stable. Hgb 10.1.  Continue to monitor. - Consider further w/u as outpatient  # HTN: BP stable.  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 Pupil non-reactive and dilated. Followed by ophtho as an outpatient.  - Continue home Cosopt and Alphagan drops   # AG Metabolic alkalosis: Resolved. AG of 24 on admission, no evidence of DKA as no acidosis on VBG, glucose not severely elevated, and mentating clearly.  AG now closed at 49. - Continue to monitor with AM BMET.   FEN/GI: Heart healthy/Carb modified diet, KVO Prophylaxis: SQ Heparin  Disposition: Pending clinical improvement, transition to PO Abx  Subjective:  Pt reports that pain is not as well controlled as it has been.  He reports fever/chills O/N.  He is feeling poorly overall still and thinks that it is related to his antihypertensives. He had loose BM O/N x3.  Objective: Temp:  [98.6 F (37 C)-103.1 F (39.5 C)] 98.6 F (37 C) (07/24 8850) Pulse Rate:  [69-87] 73 (07/24 0608) Resp:  [12-20] 20 (07/24 2774) BP: (117-147)/(67-84) 126/70 mmHg (07/24 0608) SpO2:  [94 %-97 %] 97 % (07/24 1287) Physical Exam: General: Pleasant man laying in bed in NAD Cardiovascular: RRR, no m/r/g, DP pulses 1+ b/l  Respiratory: CTAB, no w/r/c. Normal WOB  Abdomen: Soft, ND/NT, NABS, no HSM  Extremities: dressing in place over R foot, c/d/i. 2+ pitting edema on RLE to level of knee, RLE warmer than LLE. Neuro/Psych: Alert & O x3,  flat affect, no evidence of responding to internal stimuli   Laboratory:  Recent Labs Lab 09/23/13 0535 09/24/13 0333 09/25/13 0411  WBC 24.4* 24.4* 21.9*  HGB 11.5* 10.3* 10.1*  HCT 35.0* 31.3* 31.4*  PLT 452* 460* 460*    Recent Labs Lab 09/23/13 0535 09/24/13 0333  09/25/13 0411  NA 137 137 138  K 4.1 3.6* 3.5*  CL 95* 97 97  CO2 29 28 27   BUN 14 11 12   CREATININE 0.99 0.91 0.94  CALCIUM 9.4 9.0 8.6  GLUCOSE 191* 143* 209*    Imaging/Diagnostic Tests: R ankle XRay (7/18): No fracture or dislocation is noted. Soft tissue swelling is seen  over lateral malleolus suggesting ligamentous injury.   R foot XRay (7/18): Soft tissue swelling and ulceration adjacent to the base of the  fifth metatarsal. No underlying osseous abnormality identified.  MRI R foot and ankle (7/22): 1. Abnormal diffuse subcutaneous and muscular edema in the foot.  Cellulitis and myositis not excluded. Deep in the subcutaneous  tissues and tracking along some of the distal lateral musculature in  the foot, there is infiltrative high T1 signal favoring hematoma.  Infectious phlegmon in this vicinity is not excluded although no  discrete drainable soft tissue abscess is seen deep within the foot.  No definite osteomyelitis.  2. Blistering and ulceration along the lateral forefoot.  3. Tenosynovitis and partial tearing of the peroneus longus.  Peroneus brevis tendinopathy.  4. Mild distal tibialis posterior tenosynovitis.  5. Low-level edema in the base of the fifth metatarsal, likely  reactive.    Lavon Paganini, MD 09/25/2013, 8:26 AM PGY-1, [place] Intern pager: [phone], text pages welcome

## 2013-09-26 ENCOUNTER — Encounter (HOSPITAL_COMMUNITY)
Admission: EM | Disposition: A | Discharge status: Rehabilitation Facility | Payer: Self-pay | Source: Home / Self Care | Attending: Family Medicine

## 2013-09-26 ENCOUNTER — Inpatient Hospital Stay (HOSPITAL_COMMUNITY): Payer: Medicaid Other | Admitting: Anesthesiology

## 2013-09-26 ENCOUNTER — Encounter (HOSPITAL_COMMUNITY): Payer: Medicaid Other | Admitting: Anesthesiology

## 2013-09-26 HISTORY — PX: AMPUTATION: SHX166

## 2013-09-26 HISTORY — PX: I&D EXTREMITY: SHX5045

## 2013-09-26 LAB — CBC
HCT: 31.8 % — ABNORMAL LOW (ref 39.0–52.0)
Hemoglobin: 10.1 g/dL — ABNORMAL LOW (ref 13.0–17.0)
MCH: 26.4 pg (ref 26.0–34.0)
MCHC: 31.8 g/dL (ref 30.0–36.0)
MCV: 83.2 fL (ref 78.0–100.0)
Platelets: 506 10*3/uL — ABNORMAL HIGH (ref 150–400)
RBC: 3.82 MIL/uL — ABNORMAL LOW (ref 4.22–5.81)
RDW: 12.8 % (ref 11.5–15.5)
WBC: 18.1 10*3/uL — ABNORMAL HIGH (ref 4.0–10.5)

## 2013-09-26 LAB — BASIC METABOLIC PANEL
Anion gap: 13 (ref 5–15)
BUN: 10 mg/dL (ref 6–23)
CO2: 31 mEq/L (ref 19–32)
Calcium: 9.1 mg/dL (ref 8.4–10.5)
Chloride: 102 mEq/L (ref 96–112)
Creatinine, Ser: 0.84 mg/dL (ref 0.50–1.35)
GFR calc Af Amer: >90 mL/min (ref >90)
GFR calc non Af Amer: >90 mL/min (ref >90)
Glucose, Bld: 48 mg/dL — ABNORMAL LOW (ref 70–99)
Potassium: 3.7 mEq/L (ref 3.7–5.3)
Sodium: 146 mEq/L (ref 137–147)

## 2013-09-26 LAB — GLUCOSE, CAPILLARY
Glucose-Capillary: 105 mg/dL — ABNORMAL HIGH (ref 70–99)
Glucose-Capillary: 62 mg/dL — ABNORMAL LOW (ref 70–99)
Glucose-Capillary: 77 mg/dL (ref 70–99)
Glucose-Capillary: 113 mg/dL — ABNORMAL HIGH (ref 70–99)
Glucose-Capillary: 196 mg/dL — ABNORMAL HIGH (ref 70–99)
Glucose-Capillary: 124 mg/dL — ABNORMAL HIGH (ref 70–99)

## 2013-09-26 LAB — CLOSTRIDIUM DIFFICILE BY PCR: Toxigenic C. Difficile by PCR: NEGATIVE

## 2013-09-26 SURGERY — IRRIGATION AND DEBRIDEMENT EXTREMITY
Anesthesia: General | Site: Foot | Laterality: Right

## 2013-09-26 MED ORDER — OXYCODONE HCL 5 MG PO TABS
ORAL_TABLET | ORAL | Status: AC
  Filled 2013-09-26: qty 2

## 2013-09-26 MED ORDER — GABAPENTIN 300 MG PO CAPS
300.0000 mg | ORAL_CAPSULE | Freq: Three times a day (TID) | ORAL | Status: DC
  Administered 2013-09-26 – 2013-09-28 (×7): 300 mg via ORAL
  Filled 2013-09-26 (×8): qty 1

## 2013-09-26 MED ORDER — DEXTROSE 50 % IV SOLN: 25.0000 mL | Freq: Once | INTRAVENOUS | Status: AC | PRN

## 2013-09-26 MED ORDER — ALUM & MAG HYDROXIDE-SIMETH 200-200-20 MG/5ML PO SUSP: 15.0000 mL | ORAL | Status: DC | PRN

## 2013-09-26 MED ORDER — BUPIVACAINE-EPINEPHRINE (PF) 0.25% -1:200000 IJ SOLN
INTRAMUSCULAR | Status: DC | PRN
  Administered 2013-09-26: 30 mL

## 2013-09-26 MED ORDER — ACETAMINOPHEN 325 MG RE SUPP
325.0000 mg | RECTAL | Status: DC | PRN
  Filled 2013-09-26: qty 2

## 2013-09-26 MED ORDER — ACETAMINOPHEN 325 MG PO TABS
325.0000 mg | ORAL_TABLET | ORAL | Status: DC | PRN
  Administered 2013-09-26: 650 mg via ORAL

## 2013-09-26 MED ORDER — HYDROMORPHONE HCL PF 1 MG/ML IJ SOLN
0.2500 mg | INTRAMUSCULAR | Status: DC | PRN
  Administered 2013-09-26 (×4): 0.5 mg via INTRAVENOUS

## 2013-09-26 MED ORDER — LACTATED RINGERS IV SOLN
INTRAVENOUS | Status: DC | PRN
  Administered 2013-09-26 (×2): via INTRAVENOUS

## 2013-09-26 MED ORDER — OXYCODONE HCL 5 MG PO TABS: 5.0000 mg | ORAL_TABLET | Freq: Once | ORAL | Status: DC | PRN

## 2013-09-26 MED ORDER — ACETAMINOPHEN 325 MG PO TABS: 325.0000 mg | ORAL_TABLET | ORAL | Status: DC | PRN

## 2013-09-26 MED ORDER — FENTANYL CITRATE 0.05 MG/ML IJ SOLN
INTRAMUSCULAR | Status: AC
  Filled 2013-09-26: qty 5

## 2013-09-26 MED ORDER — ACETAMINOPHEN 325 MG PO TABS
ORAL_TABLET | ORAL | Status: AC
  Administered 2013-09-26: 650 mg via ORAL
  Filled 2013-09-26: qty 2

## 2013-09-26 MED ORDER — HYDROMORPHONE HCL PF 1 MG/ML IJ SOLN
0.5000 mg | INTRAMUSCULAR | Status: DC | PRN
  Filled 2013-09-26: qty 1

## 2013-09-26 MED ORDER — HYDROMORPHONE HCL PF 1 MG/ML IJ SOLN
INTRAMUSCULAR | Status: AC
  Administered 2013-09-26: 0.5 mg via INTRAVENOUS
  Filled 2013-09-26: qty 1

## 2013-09-26 MED ORDER — ONDANSETRON HCL 4 MG/2ML IJ SOLN: 4.0000 mg | Freq: Four times a day (QID) | INTRAMUSCULAR | Status: DC | PRN

## 2013-09-26 MED ORDER — PANTOPRAZOLE SODIUM 40 MG PO TBEC
40.0000 mg | DELAYED_RELEASE_TABLET | Freq: Every day | ORAL | Status: DC
  Administered 2013-09-26 – 2013-09-28 (×3): 40 mg via ORAL
  Filled 2013-09-26 (×2): qty 1

## 2013-09-26 MED ORDER — ACETAMINOPHEN 10 MG/ML IV SOLN: 1000.0000 mg | Freq: Four times a day (QID) | INTRAVENOUS | Status: DC

## 2013-09-26 MED ORDER — PROPOFOL 10 MG/ML IV BOLUS
INTRAVENOUS | Status: DC | PRN
  Administered 2013-09-26: 200 mg via INTRAVENOUS

## 2013-09-26 MED ORDER — MIDAZOLAM HCL 5 MG/5ML IJ SOLN
INTRAMUSCULAR | Status: DC | PRN
  Administered 2013-09-26: 2 mg via INTRAVENOUS

## 2013-09-26 MED ORDER — LIDOCAINE HCL (CARDIAC) 20 MG/ML IV SOLN
INTRAVENOUS | Status: DC | PRN
  Administered 2013-09-26: 100 mg via INTRAVENOUS

## 2013-09-26 MED ORDER — INSULIN GLARGINE 100 UNIT/ML ~~LOC~~ SOLN
30.0000 [IU] | Freq: Every day | SUBCUTANEOUS | Status: DC
  Administered 2013-09-26 – 2013-09-27 (×2): 30 [IU] via SUBCUTANEOUS
  Filled 2013-09-26 (×3): qty 0.3

## 2013-09-26 MED ORDER — ACETAMINOPHEN 500 MG PO TABS
1000.0000 mg | ORAL_TABLET | Freq: Four times a day (QID) | ORAL | Status: AC
  Administered 2013-09-26 – 2013-09-27 (×4): 1000 mg via ORAL
  Filled 2013-09-26 (×4): qty 2

## 2013-09-26 MED ORDER — PHENOL 1.4 % MT LIQD: 1.0000 | OROMUCOSAL | Status: DC | PRN

## 2013-09-26 MED ORDER — PROPOFOL 10 MG/ML IV BOLUS
INTRAVENOUS | Status: AC
  Filled 2013-09-26: qty 20

## 2013-09-26 MED ORDER — GUAIFENESIN-DM 100-10 MG/5ML PO SYRP: 15.0000 mL | ORAL_SOLUTION | ORAL | Status: DC | PRN

## 2013-09-26 MED ORDER — ONDANSETRON HCL 4 MG/2ML IJ SOLN
INTRAMUSCULAR | Status: AC
  Filled 2013-09-26: qty 2

## 2013-09-26 MED ORDER — 0.9 % SODIUM CHLORIDE (POUR BTL) OPTIME
TOPICAL | Status: DC | PRN
  Administered 2013-09-26: 1000 mL

## 2013-09-26 MED ORDER — BUPIVACAINE-EPINEPHRINE (PF) 0.25% -1:200000 IJ SOLN
INTRAMUSCULAR | Status: AC
  Filled 2013-09-26: qty 30

## 2013-09-26 MED ORDER — DEXTROSE 50 % IV SOLN
INTRAVENOUS | Status: DC | PRN
  Administered 2013-09-26: 12.5 g via INTRAVENOUS

## 2013-09-26 MED ORDER — PHENYLEPHRINE 40 MCG/ML (10ML) SYRINGE FOR IV PUSH (FOR BLOOD PRESSURE SUPPORT)
PREFILLED_SYRINGE | INTRAVENOUS | Status: AC
  Filled 2013-09-26: qty 10

## 2013-09-26 MED ORDER — ACETAMINOPHEN 650 MG RE SUPP: 325.0000 mg | RECTAL | Status: DC | PRN

## 2013-09-26 MED ORDER — DEXTROSE 50 % IV SOLN
INTRAVENOUS | Status: AC
  Filled 2013-09-26: qty 50

## 2013-09-26 MED ORDER — OXYCODONE HCL 5 MG PO TABS
5.0000 mg | ORAL_TABLET | ORAL | Status: DC | PRN
  Administered 2013-09-26 – 2013-09-27 (×3): 10 mg via ORAL
  Administered 2013-09-27 – 2013-09-28 (×4): 20 mg via ORAL
  Filled 2013-09-26: qty 1
  Filled 2013-09-26 (×4): qty 4
  Filled 2013-09-26: qty 2
  Filled 2013-09-26: qty 4

## 2013-09-26 MED ORDER — OXYCODONE HCL 5 MG/5ML PO SOLN: 5.0000 mg | Freq: Once | ORAL | Status: DC | PRN

## 2013-09-26 MED ORDER — PHENYLEPHRINE HCL 10 MG/ML IJ SOLN
INTRAMUSCULAR | Status: DC | PRN
  Administered 2013-09-26 (×2): 80 ug via INTRAVENOUS

## 2013-09-26 MED ORDER — MIDAZOLAM HCL 2 MG/2ML IJ SOLN
INTRAMUSCULAR | Status: AC
  Filled 2013-09-26: qty 2

## 2013-09-26 MED ORDER — HYDRALAZINE HCL 20 MG/ML IJ SOLN: 10.0000 mg | INTRAMUSCULAR | Status: DC | PRN

## 2013-09-26 MED ORDER — METOPROLOL TARTRATE 1 MG/ML IV SOLN: 2.0000 mg | INTRAVENOUS | Status: DC | PRN

## 2013-09-26 MED ORDER — PROMETHAZINE HCL 25 MG/ML IJ SOLN: 6.2500 mg | INTRAMUSCULAR | Status: DC | PRN

## 2013-09-26 MED ORDER — DEXTROSE 50 % IV SOLN
INTRAVENOUS | Status: AC
  Administered 2013-09-26: 25 mL
  Filled 2013-09-26: qty 50

## 2013-09-26 MED ORDER — FENTANYL CITRATE 0.05 MG/ML IJ SOLN
INTRAMUSCULAR | Status: DC | PRN
  Administered 2013-09-26 (×3): 50 ug via INTRAVENOUS
  Administered 2013-09-26: 100 ug via INTRAVENOUS

## 2013-09-26 MED ORDER — ONDANSETRON HCL 4 MG/2ML IJ SOLN
INTRAMUSCULAR | Status: DC | PRN
  Administered 2013-09-26: 4 mg via INTRAVENOUS

## 2013-09-26 MED ORDER — LACTATED RINGERS IV SOLN
INTRAVENOUS | Status: DC
  Administered 2013-09-27 (×2): via INTRAVENOUS
  Administered 2013-09-27: 1000 mL via INTRAVENOUS
  Administered 2013-09-28: 14:00:00 via INTRAVENOUS

## 2013-09-26 MED ORDER — SODIUM CHLORIDE 0.9 % IR SOLN
Status: DC | PRN
  Administered 2013-09-26: 3000 mL

## 2013-09-26 MED ORDER — LIDOCAINE HCL (CARDIAC) 20 MG/ML IV SOLN
INTRAVENOUS | Status: AC
  Filled 2013-09-26: qty 5

## 2013-09-26 SURGICAL SUPPLY — 67 items
BANDAGE GAUZE ELAST BULKY 4 IN (GAUZE/BANDAGES/DRESSINGS) ×4 IMPLANT
BLADE SURG 10 STRL SS (BLADE) ×2 IMPLANT
BNDG COHESIVE 4X5 TAN STRL (GAUZE/BANDAGES/DRESSINGS) ×2 IMPLANT
BNDG GAUZE ELAST 4 BULKY (GAUZE/BANDAGES/DRESSINGS) ×1 IMPLANT
BNDG GAUZE STRTCH 6 (GAUZE/BANDAGES/DRESSINGS) ×6 IMPLANT
BRUSH SCRUB DISP (MISCELLANEOUS) ×4 IMPLANT
COVER SURGICAL LIGHT HANDLE (MISCELLANEOUS) ×4 IMPLANT
DRAPE ORTHO SPLIT 77X108 STRL (DRAPES) ×2
DRAPE PROXIMA HALF (DRAPES) ×1 IMPLANT
DRAPE SURG ORHT 6 SPLT 77X108 (DRAPES) IMPLANT
DRAPE U-SHAPE 47X51 STRL (DRAPES) ×2 IMPLANT
DRSG ADAPTIC 3X8 NADH LF (GAUZE/BANDAGES/DRESSINGS) ×2 IMPLANT
DRSG PAD ABDOMINAL 8X10 ST (GAUZE/BANDAGES/DRESSINGS) ×2 IMPLANT
ELECT CAUTERY BLADE 6.4 (BLADE) IMPLANT
ELECT REM PT RETURN 9FT ADLT (ELECTROSURGICAL) ×2
ELECTRODE REM PT RTRN 9FT ADLT (ELECTROSURGICAL) IMPLANT
GLOVE BIO SURGEON STRL SZ7.5 (GLOVE) ×2 IMPLANT
GLOVE BIO SURGEON STRL SZ8 (GLOVE) ×2 IMPLANT
GLOVE BIOGEL PI IND STRL 7.5 (GLOVE) ×1 IMPLANT
GLOVE BIOGEL PI IND STRL 8 (GLOVE) ×1 IMPLANT
GLOVE BIOGEL PI INDICATOR 7.5 (GLOVE) ×1
GLOVE BIOGEL PI INDICATOR 8 (GLOVE) ×1
GOWN STRL REUS W/ TWL LRG LVL3 (GOWN DISPOSABLE) ×2 IMPLANT
GOWN STRL REUS W/ TWL XL LVL3 (GOWN DISPOSABLE) ×1 IMPLANT
GOWN STRL REUS W/TWL LRG LVL3 (GOWN DISPOSABLE) ×4
GOWN STRL REUS W/TWL XL LVL3 (GOWN DISPOSABLE) ×6
HANDPIECE INTERPULSE COAX TIP (DISPOSABLE) ×2
KIT BASIN OR (CUSTOM PROCEDURE TRAY) ×2 IMPLANT
KIT ROOM TURNOVER OR (KITS) ×2 IMPLANT
MANIFOLD NEPTUNE II (INSTRUMENTS) ×2 IMPLANT
NDL HYPO 25GX1X1/2 BEV (NEEDLE) IMPLANT
NEEDLE 22X1 1/2 (OR ONLY) (NEEDLE) ×1 IMPLANT
NEEDLE HYPO 25GX1X1/2 BEV (NEEDLE) ×2 IMPLANT
NS IRRIG 1000ML POUR BTL (IV SOLUTION) ×2 IMPLANT
PACK ORTHO EXTREMITY (CUSTOM PROCEDURE TRAY) ×2 IMPLANT
PAD ARMBOARD 7.5X6 YLW CONV (MISCELLANEOUS) ×4 IMPLANT
PADDING CAST ABS 4INX4YD NS (CAST SUPPLIES) ×1
PADDING CAST ABS 6INX4YD NS (CAST SUPPLIES) ×1
PADDING CAST ABS COTTON 4X4 ST (CAST SUPPLIES) IMPLANT
PADDING CAST ABS COTTON 6X4 NS (CAST SUPPLIES) IMPLANT
PADDING CAST COTTON 6X4 STRL (CAST SUPPLIES) ×2 IMPLANT
PENCIL BUTTON HOLSTER BLD 10FT (ELECTRODE) ×1 IMPLANT
SAGITTAL BLADE ×1 IMPLANT
SET HNDPC FAN SPRY TIP SCT (DISPOSABLE) IMPLANT
SPLINT PLASTER CAST XFAST 5X30 (CAST SUPPLIES) IMPLANT
SPLINT PLASTER XFAST SET 5X30 (CAST SUPPLIES) ×1
SPONGE GAUZE 4X4 12PLY (GAUZE/BANDAGES/DRESSINGS) ×2 IMPLANT
SPONGE GAUZE 4X4 12PLY STER LF (GAUZE/BANDAGES/DRESSINGS) ×1 IMPLANT
SPONGE LAP 18X18 X RAY DECT (DISPOSABLE) ×3 IMPLANT
STOCKINETTE IMPERVIOUS 9X36 MD (GAUZE/BANDAGES/DRESSINGS) ×3 IMPLANT
SUT ETHILON 3 0 PS 1 (SUTURE) ×6 IMPLANT
SUT PDS AB 2-0 CT1 27 (SUTURE) IMPLANT
SUT SILK 0 FSL (SUTURE) ×1 IMPLANT
SUT SILK 2 0 TIES 10X30 (SUTURE) ×1 IMPLANT
SUT VIC AB 1 CT1 27 (SUTURE) ×4
SUT VIC AB 1 CT1 27XBRD ANBCTR (SUTURE) IMPLANT
SUT VIC AB 2-0 CT1 27 (SUTURE) ×8
SUT VIC AB 2-0 CT1 TAPERPNT 27 (SUTURE) IMPLANT
SYRINGE CONTROL L 12CC (SYRINGE) ×2 IMPLANT
SYRINGE CONTROL LL 12CC (SYRINGE) IMPLANT
TOWEL OR 17X24 6PK STRL BLUE (TOWEL DISPOSABLE) ×2 IMPLANT
TOWEL OR 17X26 10 PK STRL BLUE (TOWEL DISPOSABLE) ×4 IMPLANT
TUBE ANAEROBIC SPECIMEN COL (MISCELLANEOUS) IMPLANT
TUBE CONNECTING 12X1/4 (SUCTIONS) ×3 IMPLANT
UNDERPAD 30X30 INCONTINENT (UNDERPADS AND DIAPERS) ×2 IMPLANT
WATER STERILE IRR 1000ML POUR (IV SOLUTION) ×2 IMPLANT
YANKAUER SUCT BULB TIP NO VENT (SUCTIONS) ×3 IMPLANT

## 2013-09-26 NOTE — Transfer of Care (Signed)
Immediate Anesthesia Transfer of Care Note  Patient: [name]  Procedure(s) Performed: Procedure(s): Repeat IRRIGATION AND DEBRIDEMENT Right Foot Ulcer (Right) AMPUTATION BELOW KNEE (Right)  Patient Location: PACU  Anesthesia Type:General  Level of Consciousness: awake, sedated and confused  Airway & Oxygen Therapy: Patient Spontanous Breathing and Patient connected to nasal cannula oxygen  Post-op Assessment: Report given to PACU RN, Post -op Vital signs reviewed and stable and Patient moving all extremities  Post vital signs: Reviewed and stable  Complications: No apparent anesthesia complications

## 2013-09-26 NOTE — Progress Notes (Signed)
Hypoglycemic Event  CBG: 62  Treatment: 25 ml of D50   Symptoms: asymptomatic  Follow-up CBG: Time:0715 CBG Result:105  Possible Reasons for Event: no food intake  Comments/MD notified: No further order / Dr. Erlene Senters, July Dizon  Remember to initiate Hypoglycemia Order Set & complete

## 2013-09-26 NOTE — Anesthesia Postprocedure Evaluation (Signed)
  Anesthesia Post-op Note  Patient: [name]  Procedure(s) Performed: Procedure(s): Repeat IRRIGATION AND DEBRIDEMENT Right Foot Ulcer (Right) AMPUTATION BELOW KNEE (Right)  Patient Location: PACU  Anesthesia Type:General  Level of Consciousness: awake and alert   Airway and Oxygen Therapy: Patient Spontanous Breathing  Post-op Pain: mild  Post-op Assessment: Post-op Vital signs reviewed  Post-op Vital Signs: stable  Last Vitals:  Filed Vitals:   09/26/13 1245  BP:   Pulse:   Temp:   Resp: 18    Complications: No apparent anesthesia complications

## 2013-09-26 NOTE — Progress Notes (Signed)
Family Medicine Teaching Service Daily Progress Note Intern Pager: [phone]  Patient name: [name] Medical record number: [id number] Date of birth: [birth year] Age: 58 y.o. Gender: male  Primary Care Provider: Tawanna Sat, MD Consultants: Ortho, Vascular surgery Code Status: Full code  Pt Overview and Major Events to Date:  7/19 Admit for IV Abx (Vanc/Zosyn) for RLE cellulitis and diabetic management 7/20 Febrile multiple times O/N -> Ortho c/s for possible debridement 7/21 still febrile, switch Zosyn to Imipenem 7/22 Ortho re-consulted as showing no improvement on IV abx 7/23 MRI foot with no definite osteo, still febrile O/N, OR for I&D with Ortho  Assessment and Plan:  [name] is a 58 y.o. male presenting with RLE Cellulitis. PMH is significant for T2DM, HTN, and Schizophrenia.   # Sepsis 2/2 RLE Cellulitis 2/2 diabetic ulcer: In setting of uncontrolled T2 DM after stopping all medications. No evidence of definite osteo on MRI. S/p I&D with Ortho 7/23. 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 - Afebrile O/N and WBC downtrending 21.9>18.1.  Continue to monitor WBC trend and fever curve - BCx (7/21) NGTD x4d - Wound cultures (7/23) pending - abundant GPC and gram variable rods on gram stain - d/c Vanc/Imipenem - Continue Oxycodone IR to 10mg  q4h prn and scheduled tylenol for pain. Will reassess after OR. - ABIs pending - Ortho and Vascular surgery following, appreciate recs    # T2DM, uncontrolled: Last A1c 13.3. Not taking medications regularly at home. CBGs 62-105 O/N. - Decrease Lantus to 30 qhs and continue moderate SSI  - Continue to monitor CBGs.   # Loose stools: Likely related to miralax that was given for constipation. - C diff PCR pending  # Thrombocytosis: Platelet count 460>506.  Likely reactive thrombocytosis in setting of infection. - Continue to monitor  # Mild normocytic anemia:  Stable. Hgb 10.1.  Continue to monitor. - Consider further w/u as outpatient  # HTN: BP  stable. D/c'd Norvasc 7/24 as patient reports generalized fatigue and weakness that he attributes to this medication. 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 Pupil non-reactive and dilated. Followed by ophtho as an outpatient.  - Continue home Cosopt and Alphagan drops   # AG Metabolic alkalosis: Resolved. AG of 24 on admission, no evidence of DKA. # Hyponatremia: Resolved. Na 146 today.  # Mild Hypokalemia: Resolved. K 3.5.  FEN/GI: Heart healthy/Carb modified diet, KVO Prophylaxis: SQ Heparin  Disposition: Pending clinical improvement  Subjective:  Patient appears sad and will not answer questions.  He only shrugs when asked about pain or anything else.  Objective: Temp:  [97.5 F (36.4 C)-98.7 F (37.1 C)] 97.5 F (36.4 C) (07/25 1350) Pulse Rate:  [64-78] 69 (07/25 1350) Resp:  [13-18] 16 (07/25 1350) BP: (122-156)/(75-88) 147/84 mmHg (07/25 1350) SpO2:  [95 %-97 %] 95 % (07/25 1350) Physical Exam: General: Pleasant man laying in bed in NAD Cardiovascular: RRR, no m/r/g Respiratory: CTAB, no w/r/c. Normal WOB  Abdomen: Soft, ND/NT, NABS, no HSM  Extremities: s/p R BKA, dressing c/d/i. Neuro/Psych: Alert & O x3,  flat affect, no evidence of responding to internal stimuli   Laboratory:  Recent Labs Lab 09/24/13 0333 09/25/13 0411 09/26/13 0342  WBC 24.4* 21.9* 18.1*  HGB 10.3* 10.1* 10.1*  HCT 31.3* 31.4* 31.8*  PLT 460* 460* 506*    Recent Labs Lab 09/24/13 0333 09/25/13 0411 09/26/13 0342  NA 137 138 146  K 3.6* 3.5* 3.7  CL 97 97 102  CO2 28 27 31   BUN 11 12 10   CREATININE 0.91 0.94 0.84  CALCIUM 9.0 8.6 9.1  GLUCOSE 143* 209* 48*    Imaging/Diagnostic Tests: R ankle XRay (7/18): No fracture or dislocation is noted. Soft tissue swelling is seen  over lateral malleolus suggesting ligamentous injury.   R foot XRay (7/18): Soft tissue swelling and ulceration  adjacent to the base of the  fifth metatarsal. No underlying osseous abnormality identified.  MRI R foot and ankle (7/22): 1. Abnormal diffuse subcutaneous and muscular edema in the foot.  Cellulitis and myositis not excluded. Deep in the subcutaneous  tissues and tracking along some of the distal lateral musculature in  the foot, there is infiltrative high T1 signal favoring hematoma.  Infectious phlegmon in this vicinity is not excluded although no  discrete drainable soft tissue abscess is seen deep within the foot.  No definite osteomyelitis.  2. Blistering and ulceration along the lateral forefoot.  3. Tenosynovitis and partial tearing of the peroneus longus.  Peroneus brevis tendinopathy.  4. Mild distal tibialis posterior tenosynovitis.  5. Low-level edema in the base of the fifth metatarsal, likely  reactive.    Lavon Paganini, MD 09/26/2013, 4:07 PM PGY-1, [place] Intern pager: [phone], text pages welcome

## 2013-09-26 NOTE — Anesthesia Preprocedure Evaluation (Addendum)
Anesthesia Evaluation  Patient identified by MRN, date of birth, ID band Patient awake    Reviewed: Allergy & Precautions, H&P , NPO status , Patient's Chart, lab work & pertinent test results  Airway Mallampati: II TM Distance: >3 FB Neck ROM: Full    Dental  (+) Edentulous Upper, Loose, Dental Advisory Given,    Pulmonary  breath sounds clear to auscultation        Cardiovascular hypertension, Pt. 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  Reproductive/Obstetrics                          Anesthesia Physical Anesthesia Plan  ASA: III  Anesthesia Plan: General   Post-op Pain Management:    Induction: Intravenous  Airway Management Planned: LMA  Additional Equipment:   Intra-op Plan:   Post-operative Plan: Extubation in OR  Informed Consent: I have reviewed the patients History and Physical, chart, labs and discussed the procedure including the risks, benefits and alternatives for the proposed anesthesia with the patient or authorized representative who has indicated his/her understanding and acceptance.   Dental advisory given  Plan Discussed with: CRNA and Surgeon  Anesthesia Plan Comments:         Anesthesia Quick Evaluation

## 2013-09-26 NOTE — Anesthesia Procedure Notes (Signed)
Procedure Name: LMA Insertion Date/Time: 09/26/2013 8:55 AM Performed by: Izora Gala Pre-anesthesia Checklist: Patient identified, Emergency Drugs available, Suction available, Patient being monitored and Timeout performed Patient Re-evaluated:Patient Re-evaluated prior to inductionOxygen Delivery Method: Circle system utilized Preoxygenation: Pre-oxygenation with 100% oxygen Intubation Type: IV induction Ventilation: Mask ventilation without difficulty LMA: LMA inserted LMA Size: 5.0 Number of attempts: 1 Placement Confirmation: positive ETCO2 Tube secured with: Tape Dental Injury: Teeth and Oropharynx as per pre-operative assessment

## 2013-09-26 NOTE — Op Note (Signed)
NAME:  [name], [name] NO.:  [id number]  MEDICAL RECORD NO.:  [id number]  LOCATION:  [facility]                         FACILITY:  [facility]  PHYSICIAN:  Astrid Divine. Marcelino Scot, M.D. DATE OF BIRTH:  06/17/1955  DATE OF PROCEDURE:  09/26/2013 DATE OF DISCHARGE:                              OPERATIVE REPORT   PREOPERATIVE DIAGNOSES:  Right foot ulcer and soft tissue infection.  POSTOPERATIVE DIAGNOSIS:  Right foot ulcer, associated with gangrene.  PROCEDURE: 1. Right below-knee amputation. 2. Repeat debridement of right foot, skin, subcutaneous tissue, and     muscle.  SURGEON:  Astrid Divine. Marcelino Scot, MD.  ASSISTANT:  None.  ANESTHESIA:  General.  COMPLICATIONS:  None.  TOTAL TOURNIQUET TIME:  62 minute.  URINE OUTPUT:  1000 mL out 100 mL urine.  DISPOSITION:  PACU.  CONDITION:  Stable.  BRIEF INDICATION OF PROCEDURE:  [name] is a 58 year old male, status post debridement 2 days ago by Dr. Percell Tucker for soft tissue infection emanating from a right foot ulcer.  At that time, MRI did not show a gangrene.  Intraoperative findings were notable for extensive infection and purulence.  The wound was packed.  Plan was to return today for another debridement.  While awaiting the return of [name] with considerable evidence pointing toward the possibility of a below-knee amputation as definitive treatment.  The patient also has developed C. diff colitis secondary to antibiotic use.  I discussed with the patient, the risks and benefits of debridement, including the possibility of failure to alleviate his infection, need for further surgery, DVT, PE, loss of motion in the knee, many others.  The patient understood these risks and did wish to proceed.  BRIEF SUMMARY OF PROCEDURE:  [name] was taken to the operating room, where general anesthesia was induced.  His right lower extremity was prepped and draped in usual sterile fashion, initially do use a tourniquet.   I began with debridement of the ulcer and foot infection and after removing full-thickness skin, subcutaneous tissue, and some muscle, we exposed a large area approximately 60%-80% across the plantar aspect of the foot of wet gangrene with liquefied gray tissue and putrid foul odor.  This extended all the way down the bone and was not salvageable.  At that point, we wrapped the foot, removed the drapes, and performed a second prep in expectation of the below-the-knee amputation.  After isolating the extremity, again a standard prep and drape was performed.  The tourniquet was placed about the thigh.  After a another time-out, the leg was elevated and exsanguinated using an Esmarch bandage starting at the calf and not at the level of infection.  The leg was then incised using a fishmouth incision with a posterior flap once the tourniquet was elevated to 350 mmHg.  The tibia was cut approximately 5 inches below the joint level leaving enough muscle, and soft tissue to swing over the anterior aspect of the tibia.  I did bevel the anterior cortex of the tibia, and cut the fibula proximal to this. I isolated all nerves and vessels, divided them into a venous and arterial branches, ligated these separately, injected all of the nerves, including the  superficial peroneal, deep peroneal, posterior tibial, saphenous, and lesser saphenous, cutting them as far distal to the wound as absolutely possible.  I did use a stick tie on the soleus muscle flaps, which appeared to be healthy and well vascularized.  I did thoroughly irrigated after each bone cut to remove all bone debris.  The anterior tibial periosteum was used to suture into the soleus fascia, and then after #1 Vicryl to cover the fibula with the peroneals.  I continued with #1 Vicryl for the deep fascia layers, and 2-0 Vicryl, and then 3-0 nylon using a series of simple and far-near-near-far sutures, a fluff dressing, and splint were  applied.  The patient was then awakened from anesthesia and transported to the PACU in stable condition.  The leg was passed off and sent to Pathology.  The tourniquet was deflated at 62 minutes after ligating all the vessels, and there was no significant bleeding with minor muscle bleeders being cauterized.  The patient was again taken to the PACU in stable condition after application of a sterile compressive dressing, using Adaptic, gauze, fluff, Webril, and a splint from thigh to leg.  PROGNOSIS:  [name] did not have a salvageable extremity, as infection was rapidly progressing from his prior procedure and left him no soft tissue coverage for the foot or trans-met.  The performance of the below-knee amputation at this point should also help him to recover systemically from the antibiotic induced C. diff.  We expect dressing change in 2-3 days, and he is at increased risk of complications, given his diabetes and other comorbidities, but we do expect that the infection had not extended into the zone of surgery and was again a motivated factor for doing so this interval versus further delay.  Once his wound is actually healed, we can discuss prosthetic fitting and the next steps.     Astrid Divine. Marcelino Scot, M.D.     MHH/MEDQ  D:  09/26/2013  T:  09/26/2013  Job:  [id number]

## 2013-09-26 NOTE — Progress Notes (Signed)
PREOP  I discussed with the patient the risks and benefits of surgery for his right foot infection, including the possibility of persistent infection, nerve injury, vessel injury, wound breakdown, DVT/ PE, loss of motion, and need for further surgery among others.  We also specifically discussed the elevated risk of soft tissue breakdown that could lead to amputation.  He understood these risks and wished to proceed.  Altamese [place], MD [facility] [phone] (p)

## 2013-09-26 NOTE — Brief Op Note (Signed)
09/19/2013 - 09/26/2013  12:10 PM  PATIENT:  [name]  57 y.o. male  PRE-OPERATIVE DIAGNOSIS:  Right Foot Ulcer  POST-OPERATIVE DIAGNOSIS:  Right foot ulcer, Wet Gangrene  PROCEDURE:  Procedure(s): 1. AMPUTATION BELOW KNEE (Right) 2. Repeat IRRIGATION AND DEBRIDEMENT Right Foot Ulcer (Right)  SURGEON:  Surgeon(s) and Role:    * Rozanna Box, MD - Primary  PHYSICIAN ASSISTANT: None  ANESTHESIA:   general  I/O:  Total I/O In: 1000 [I.V.:1000] Out: 100 [Urine:100]  SPECIMEN:  Source of Specimen:  right leg for gross  DISPOSITION OF SPECIMEN:  To path  TOURNIQUET:   Total Tourniquet Time Documented: Thigh (Right) - 62 minutes Total: Thigh (Right) - 62 minutes   DICTATION: .Other Dictation: Dictation Number [id number]

## 2013-09-27 ENCOUNTER — Inpatient Hospital Stay (HOSPITAL_COMMUNITY): Payer: Medicaid Other

## 2013-09-27 LAB — BASIC METABOLIC PANEL
Anion gap: 12 (ref 5–15)
BUN: 10 mg/dL (ref 6–23)
CO2: 28 mEq/L (ref 19–32)
Calcium: 8.5 mg/dL (ref 8.4–10.5)
Chloride: 105 mEq/L (ref 96–112)
Creatinine, Ser: 0.96 mg/dL (ref 0.50–1.35)
GFR calc Af Amer: >90 mL/min (ref >90)
GFR calc non Af Amer: >90 mL/min (ref >90)
Glucose, Bld: 71 mg/dL (ref 70–99)
Potassium: 4 mEq/L (ref 3.7–5.3)
Sodium: 145 mEq/L (ref 137–147)

## 2013-09-27 LAB — CULTURE, ROUTINE-ABSCESS: Gram Stain: NONE SEEN

## 2013-09-27 LAB — GLUCOSE, CAPILLARY
Glucose-Capillary: 67 mg/dL — ABNORMAL LOW (ref 70–99)
Glucose-Capillary: 144 mg/dL — ABNORMAL HIGH (ref 70–99)
Glucose-Capillary: 178 mg/dL — ABNORMAL HIGH (ref 70–99)
Glucose-Capillary: 101 mg/dL — ABNORMAL HIGH (ref 70–99)
Glucose-Capillary: 145 mg/dL — ABNORMAL HIGH (ref 70–99)

## 2013-09-27 LAB — CBC
HCT: 31 % — ABNORMAL LOW (ref 39.0–52.0)
HCT: 29.5 % — ABNORMAL LOW (ref 39.0–52.0)
Hemoglobin: 9.8 g/dL — ABNORMAL LOW (ref 13.0–17.0)
Hemoglobin: 9.3 g/dL — ABNORMAL LOW (ref 13.0–17.0)
MCH: 26.6 pg (ref 26.0–34.0)
MCH: 26.3 pg (ref 26.0–34.0)
MCHC: 31.6 g/dL (ref 30.0–36.0)
MCHC: 31.5 g/dL (ref 30.0–36.0)
MCV: 84 fL (ref 78.0–100.0)
MCV: 83.3 fL (ref 78.0–100.0)
Platelets: 516 10*3/uL — ABNORMAL HIGH (ref 150–400)
Platelets: 587 10*3/uL — ABNORMAL HIGH (ref 150–400)
RBC: 3.69 MIL/uL — ABNORMAL LOW (ref 4.22–5.81)
RBC: 3.54 MIL/uL — ABNORMAL LOW (ref 4.22–5.81)
RDW: 12.9 % (ref 11.5–15.5)
RDW: 12.7 % (ref 11.5–15.5)
WBC: 14.5 10*3/uL — ABNORMAL HIGH (ref 4.0–10.5)
WBC: 17.5 10*3/uL — ABNORMAL HIGH (ref 4.0–10.5)

## 2013-09-27 LAB — TISSUE CULTURE

## 2013-09-27 MED ORDER — SODIUM CHLORIDE 0.9 % IV SOLN
500.0000 mg | Freq: Four times a day (QID) | INTRAVENOUS | Status: DC
  Administered 2013-09-27 – 2013-09-28 (×3): 500 mg via INTRAVENOUS
  Filled 2013-09-27 (×5): qty 500

## 2013-09-27 MED ORDER — VANCOMYCIN HCL IN DEXTROSE 1-5 GM/200ML-% IV SOLN
1000.0000 mg | Freq: Three times a day (TID) | INTRAVENOUS | Status: DC
  Administered 2013-09-27 – 2013-09-28 (×3): 1000 mg via INTRAVENOUS
  Filled 2013-09-27 (×4): qty 200

## 2013-09-27 NOTE — Progress Notes (Signed)
Notified by RN that patient was bleeding from surgical site. Pillow case covered with a moderate amount of serous blood. Reinforced dressing by applying 2 ABD pads and securing with ace dressing. Spk with Ainsley Spinner, PA to provide an update regarding patient's status. Vitals: 98.1, 143/82, 69, 14, 96% on room air. Advised to apply ice and continue to monitor.

## 2013-09-27 NOTE — Progress Notes (Signed)
Paged Dr. Marcelino Scot twice without a response. Talked to Dr. Bonner Puna with Family Medicine about if pt should get 1400 dose of heparin or not. Pt has had dressing reinforced due to some bleeding and ice applied to the right leg. Pt's hemoglobin this morning is 9.8. Dr. Bonner Puna stated the benefit of the heparin outweighs the risk of not giving it at this time and wants the 1400 dose to be given. Dr. Bonner Puna also stated he plans to order labs for the evening.

## 2013-09-27 NOTE — Progress Notes (Signed)
[facility] Attending Daily Note:  Annabell Sabal MD  [phone] pager  Family Practice pager:  [phone] I have discussed this patient with the resident Dr. Jacinto Reap.  I agree with their findings, assessment, and care plan.  Unable to examine patient as was in surgery much of afternoon.  (*Late note)

## 2013-09-27 NOTE — Progress Notes (Signed)
Orthopaedic Trauma Service (OTS)  Subjective: 1 Day Post-Op Procedure(s) (LRB): Repeat IRRIGATION AND DEBRIDEMENT Right Foot Ulcer (Right) AMPUTATION BELOW KNEE (Right) Patient reports pain as moderate.   Up with PT, transitioning to PO meds today.  Happy to off precautions.  Objective: Current Vitals Blood pressure 153/84, pulse 76, temperature 98.1 F (36.7 C), temperature source Oral, resp. rate 17, height 6' (1.829 m), weight 216 lb 14.4 oz (98.385 kg), SpO2 97.00%. Vital signs in last 24 hours: Temp:  [97.9 F (36.6 C)-98.7 F (37.1 C)] 98.1 F (36.7 C) (07/26 1321) Pulse Rate:  [62-76] 76 (07/26 1321) Resp:  [15-18] 17 (07/26 1321) BP: (140-153)/(77-96) 153/84 mmHg (07/26 1321) SpO2:  [93 %-97 %] 97 % (07/26 1321)  Intake/Output from previous day: 07/25 0701 - 07/26 0700 In: 1222 [P.O.:222; I.V.:1000] Out: 950 [Urine:950]  LABS  Recent Labs  09/25/13 0411 09/26/13 0342 09/27/13 0519  HGB 10.1* 10.1* 9.8*    Recent Labs  09/26/13 0342 09/27/13 0519  WBC 18.1* 14.5*  RBC 3.82* 3.69*  HCT 31.8* 31.0*  PLT 506* 516*    Recent Labs  09/26/13 0342 09/27/13 0519  NA 146 145  K 3.7 4.0  CL 102 105  CO2 31 28  BUN 10 10  CREATININE 0.84 0.96  GLUCOSE 48* 71  CALCIUM 9.1 8.5   No results found for this basename: LABPT, INR,  in the last 72 hours   Physical Exam  Bloody drainage right BKA, splint fitting well.  Imaging No results found.  Assessment/Plan: 1 Day Post-Op Procedure(s) (LRB): Repeat IRRIGATION AND DEBRIDEMENT Right Foot Ulcer (Right) AMPUTATION BELOW KNEE (Right)  Cont DVT proph PT/ OT Rehab consult Abx for just two more days if wound looks appropriate on drsg change tomorrow.  Altamese [place], MD [facility] [phone] (p)   09/27/2013, 4:07 PM

## 2013-09-27 NOTE — Progress Notes (Signed)
Family Medicine Teaching Service Daily Progress Note Intern Pager: [phone]  Patient name: MONTEE TALLMAN Medical record number: [id number] Date of birth: 06-Sep-1955 Age: 58 y.o. Gender: male  Primary Care Provider: Tawanna Sat, MD Consultants: Ortho, Vascular surgery Code Status: Full code  Pt Overview and Major Events to Date:  7/19 Admit for IV Abx (Vanc/Zosyn) for RLE cellulitis and diabetic management 7/20 Febrile multiple times O/N -> Ortho c/s for possible debridement 7/21 still febrile, switch Zosyn to Imipenem 7/22 Ortho re-consulted as showing no improvement on IV abx 7/23 I&D 7/25 BKA  Assessment and Plan:  STAR CHEESE is a 58 y.o. male presenting with RLE Cellulitis. PMH is significant for T2DM, HTN, and Schizophrenia.   Sepsis 2/2 RLE cellulitis 2/2 diabetic ulcer: Resolved s/p BKA 7/25; improving leukocytosis; vancomycin, imipenem discontinued. Negative blood cultures from admission. - Continue to monitor WBC trend and fever curve - Wound cultures (7/23): NGTD (gram stain: abundant GPC, gram variable rods) - Dilaudid 0.5-1.0mg  IV q2h prn pain; oxyIR 5-20mg  q4h prn pain - Orthopedics recommendations appreciated   T2DM, uncontrolled: Last A1c 13.3. Not taking medications regularly at home. Fasting CBG today: 67 (NPO much of yesterday). - Monitor CBG on decreased lantus 30u qHS, continue moderate SSI, adjust requirement as indicated  Loose stools: Likely related to miralax that was given for constipation. - C diff PCR negative - No intervention given ongoing opioids  Thrombocytosis: Mild, stable. Likely reactive thrombocytosis in setting of infection. - Continue to monitor  Mild normocytic anemia:  Stable without postoperative diminution. - Consider further w/u as outpatient  HTN: BP stable. D/c'd Norvasc 7/24 as patient reports generalized fatigue and weakness that he attributes to this medication. 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 Pupil non-reactive and dilated. Followed by ophtho as an outpatient.  - Continue home Cosopt and Alphagan drops   AG Metabolic alkalosis: Resolved.  Hyponatremia: Resolved.  Mild Hypokalemia: Resolved.   FEN/GI: Heart healthy/Carb modified diet, KVO Prophylaxis: SQ Heparin  Disposition: Discharge to CIR pending clinical stability - early this week.    Subjective:  Patient slightly more responsive today, reporting feeling modestly better. No fevers, SOB, CP.   Objective: Temp:  [97.5 F (36.4 C)-98.7 F (37.1 C)] 98.7 F (37.1 C) (07/26 0541) Pulse Rate:  [62-78] 76 (07/26 0541) Resp:  [13-18] 16 (07/26 0541) BP: (130-156)/(77-96) 140/77 mmHg (07/26 0541) SpO2:  [93 %-97 %] 95 % (07/26 0541) Physical Exam: General: Pleasant 58 y.o. male laying in bed in NAD Cardiovascular: RRR, no m/r/g. No JVD Respiratory: CTAB, no w/r/c. Normal WOB  Abdomen: Soft, ND/NT, NABS, no HSM  Extremities: s/p R BKA, dressing intact with bloody discharge.  Neuro/Psych: Alert & O x3,  flat affect, no evidence of responding to internal stimuli. Mood is "better"  Laboratory:  Recent Labs Lab 09/25/13 0411 09/26/13 0342 09/27/13 0519  WBC 21.9* 18.1* 14.5*  HGB 10.1* 10.1* 9.8*  HCT 31.4* 31.8* 31.0*  PLT 460* 506* 516*    Recent Labs Lab 09/25/13 0411 09/26/13 0342 09/27/13 0519  NA 138 146 145  K 3.5* 3.7 4.0  CL 97 102 105  CO2 27 31 28   BUN 12 10 10   CREATININE 0.94 0.84 0.96  CALCIUM 8.6 9.1 8.5  GLUCOSE 209* 48* 71    Imaging/Diagnostic Tests: R ankle XRay (7/18): No fracture or dislocation is noted. Soft tissue swelling is seen  over lateral malleolus suggesting ligamentous injury.  R foot XRay (7/18): Soft tissue swelling and ulceration adjacent to the base of the  fifth metatarsal. No underlying osseous abnormality identified.  MRI R foot and ankle (7/22): 1. Abnormal diffuse subcutaneous and muscular edema in the foot.   Cellulitis and myositis not excluded. Deep in the subcutaneous  tissues and tracking along some of the distal lateral musculature in  the foot, there is infiltrative high T1 signal favoring hematoma.  Infectious phlegmon in this vicinity is not excluded although no  discrete drainable soft tissue abscess is seen deep within the foot.  No definite osteomyelitis.  2. Blistering and ulceration along the lateral forefoot.  3. Tenosynovitis and partial tearing of the peroneus longus.  Peroneus brevis tendinopathy.  4. Mild distal tibialis posterior tenosynovitis.  5. Low-level edema in the base of the fifth metatarsal, likely  reactive.  Vance Gather, MD 09/27/2013, 9:26 AM PGY-2, [place] Intern pager: [phone], text pages welcome

## 2013-09-27 NOTE — Evaluation (Signed)
Physical Therapy Evaluation Patient Details Name: [name] MRN: [id number] DOB: June 22, 1955 Today's Date: 09/27/2013   History of Present Illness   58 yo male with hx schizophrenia, HTN, DM2 presented 7/19 with RLE cellulitis.  Failed management with abx, underwent I&D and ultimately BKA   Clinical Impression  Pt presents with moderate limitations to mobility related to new BKA, however had excellent strength and adaptability despite limitations.  Up POD1 with nursing to chair and BSC, and able to participate with therapy eval after.  Is motivated, cooperative and has excellent potential for modified independence at w/c level as well as candidacy for prosthetic fitting from clinical perspective.  Will initiate mobility retraining in acute setting and recommend CIR consult for potential admission.  PT will assist with ongoing assessment for d/c planning.  Thank you.    Follow Up Recommendations CIR    Equipment Recommendations  Rolling walker with 5" wheels;Wheelchair (measurements PT)    Recommendations for Other Services Rehab consult     Precautions / Restrictions Precautions Precautions: Fall Precaution Comments: s/p BKA Restrictions Weight Bearing Restrictions: Yes RLE Weight Bearing: Non weight bearing Other Position/Activity Restrictions: elevate operated leg, do not prop knee with pillows,       Mobility  Bed Mobility Overal bed mobility: Modified Independent             General bed mobility comments: bridges, moves to/from EOB, scoots without physical assist  Transfers Overall transfer level: Needs assistance Equipment used: Rolling walker (2 wheeled) Transfers: Sit to/from [facility] Sit to Stand: Min assist Stand pivot transfers: Min assist       General transfer comment: reported by nursing, pt able to transfer bed>chair, bed>BSC with RW and hopping technique  Ambulation/Gait             General Gait Details: not assessed or  described  Stairs            Wheelchair Mobility    Modified Rankin (Stroke Patients Only)       Balance                                             Pertinent Vitals/Pain Grimaces with mobility but pain seems well controlled    Home Living Family/patient expects to be discharged to:: Private residence Living Arrangements: Alone Available Help at Discharge: Available PRN/intermittently;Family Type of Home: Apartment Home Access: Level entry (lives on first floor)     Home Layout: One level Home Equipment: None Additional Comments: pt cannot go live with sister per his report    Prior Function Level of Independence: Independent               Hand Dominance        Extremity/Trunk Assessment   Upper Extremity Assessment: Overall WFL for tasks assessed           Lower Extremity Assessment: RLE deficits/detail RLE Deficits / Details: bulky ACE wrap limiting knee flexion but extension appears intact. otherwise strong       Communication   Communication: No difficulties  Cognition Arousal/Alertness: Awake/alert Behavior During Therapy: WFL for tasks assessed/performed Overall Cognitive Status: History of cognitive impairments - at baseline (per nurse, hx of mental illness. Cooperative and appropriate)                      General Comments General  comments (skin integrity, edema, etc.): dressing on residual limb reinforced, not due for change for 1-2 days, RN notes still bleeding and requests minimal mobility this assess.  Pt very motivated and cooperative, strong, good ROM, excellent potential.  see clinical impression statement    Exercises        Assessment/Plan    PT Assessment Patient needs continued PT services  PT Diagnosis Difficulty walking;Acute pain   PT Problem List Pain;Decreased knowledge of precautions;Decreased safety awareness;Decreased knowledge of use of DME;Decreased cognition;Decreased  mobility;Decreased range of motion;Decreased strength  PT Treatment Interventions DME instruction;Gait training;Functional mobility training;Therapeutic activities;Therapeutic exercise;Patient/family education;Wheelchair mobility training   PT Goals (Current goals can be found in the Care Plan section) Acute Rehab PT Goals Patient Stated Goal: go home PT Goal Formulation: With patient Time For Goal Achievement: 10/11/13 Potential to Achieve Goals: Good    Frequency Min 4X/week   Barriers to discharge Decreased caregiver support      Co-evaluation               End of Session   Activity Tolerance: Patient tolerated treatment well Patient left: in bed;with call bell/phone within reach Nurse Communication: Mobility status         Time: 6578-4696 PT Time Calculation (min): 28 min   Charges:   PT Evaluation $Initial PT Evaluation Tier I: 1 Procedure PT Treatments $Therapeutic Activity: 8-22 mins   PT G Codes:          [name] 09/27/2013, 2:54 PM

## 2013-09-27 NOTE — Evaluation (Addendum)
Occupational Therapy Evaluation Patient Details Name: [name] MRN: [id number] DOB: 1955/09/17 Today's Date: 09/27/2013    History of Present Illness 58 yo male with hx schizophrenia, HTN, DM2 presented 7/19 with RLE cellulitis.  Failed management with abx, underwent I&D and ultimately BKA   Clinical Impression   Pt s/p above. Pt independent with ADLs, PTA. Feel pt will benefit from acute OT to increase independence prior to d/c. Recommending CIR for additional rehab prior to d/c and feel pt is great candidate.      Follow Up Recommendations  CIR    Equipment Recommendations  3 in 1 bedside comode    Recommendations for Other Services       Precautions / Restrictions Precautions Precautions: Fall Precaution Comments: educated on precautions Restrictions Weight Bearing Restrictions: Yes RLE Weight Bearing: Non weight bearing Other Position/Activity Restrictions: elevate operated leg, do not prop knee with pillows,       Mobility Bed Mobility Overal bed mobility: Modified Independent              Transfers Overall transfer level: Needs assistance Equipment used: Rolling walker (2 wheeled) Transfers: Sit to/from [facility] Sit to Stand: Min guard Stand pivot transfers: Min assist       General transfer comment: Pt performed stand pivot to [facility] and then hopped to chair. Cues for technique.    Balance                                            ADL Overall ADL's : Needs assistance/impaired     Grooming: Set up;Sitting       Lower Body Bathing: Minimal assistance;Sit to/from stand       Lower Body Dressing: Min guard;Sit to/from stand   Toilet Transfer: Minimal assistance;Stand-pivot;RW;BSC           Functional mobility during ADLs: Minimal assistance;Rolling walker General ADL Comments: Educated on alternative technique for LB ADLs (leaning or in bed). Educated on desensitization techniques for RLE.  Elevated RLE at end of session. Pt washed off LB and peri area during session and donned/doffed pants. Educated on chair push ups as pt is going to have to be using UB a lot for transfers and mobility. Talked with pt about CIR and d/c options.     Vision                     Perception     Praxis      Pertinent Vitals/Pain Pain 5/10. Repositioned.      Hand Dominance     Extremity/Trunk Assessment Upper Extremity Assessment Upper Extremity Assessment: Overall WFL for tasks assessed   Lower Extremity Assessment Lower Extremity Assessment: Defer to PT evaluation RLE Deficits / Details: bulky ACE wrap limiting knee flexion but extension appears intact. otherwise strong RLE: Unable to fully assess due to immobilization       Communication Communication Communication: No difficulties   Cognition Arousal/Alertness: Awake/alert Behavior During Therapy: WFL for tasks assessed/performed Overall Cognitive Status: History of cognitive impairments - at baseline (slow processing)                     General Comments       Exercises       Shoulder Instructions      Home Living Family/patient expects to be discharged to:: Private residence Living Arrangements:  Alone Available Help at Discharge: Available PRN/intermittently;Family Type of Home: Apartment Home Access: Level entry (lives on first floor)     Home Layout: One level     Bathroom Shower/Tub: Teacher, early years/pre: Standard     Home Equipment: None   Additional Comments: pt cannot go live with sister per his report      Prior Functioning/Environment Level of Independence: Independent             OT Diagnosis: Acute pain   OT Problem List: Impaired balance (sitting and/or standing);Decreased knowledge of use of DME or AE;Decreased knowledge of precautions;Pain;Decreased cognition   OT Treatment/Interventions: Self-care/ADL training;DME and/or AE instruction;Therapeutic  activities;Patient/family education;Balance training;Therapeutic exercise    OT Goals(Current goals can be found in the care plan section) Acute Rehab OT Goals Patient Stated Goal: not stated OT Goal Formulation: With patient Time For Goal Achievement: 10/04/13 Potential to Achieve Goals: Good ADL Goals Pt Will Perform Lower Body Bathing: with supervision;sit to/from stand Pt Will Perform Lower Body Dressing: with supervision;sit to/from stand Pt Will Transfer to Toilet: stand pivot transfer;bedside commode;with supervision Pt Will Perform Toileting - Clothing Manipulation and hygiene: with supervision;sit to/from stand  OT Frequency: Min 2X/week   Barriers to D/C:            Co-evaluation              End of Session Equipment Utilized During Treatment: Gait belt;Rolling walker Nurse Communication: Mobility status  Activity Tolerance: Patient tolerated treatment well Patient left: in chair;with call bell/phone within reach   Time: 3254-9826 OT Time Calculation (min): 24 min Charges:  OT General Charges $OT Visit: 1 Procedure OT Evaluation $Initial OT Evaluation Tier I: 1 Procedure OT Treatments $Self Care/Home Management : 8-22 mins G-CodesBenito Mccreedy OTR/L [phone] 09/27/2013, 5:30 PM

## 2013-09-27 NOTE — Progress Notes (Addendum)
FMTS Attending Daily Note:  Annabell Sabal MD  [phone] pager  Family Practice pager:  [phone] I have seen and examined this patient and have reviewed their chart. I have discussed this patient with the resident. I agree with the resident's findings, assessment and care plan.  Additionally:  - s/p BKA.  Obviously disappointed about this, but motivated to rehab - Pain is currently under control.  Was receiving 1000 mg QID x 4 doses, this has stopped.  If pain becomes an issue, can restart but at lower dose of 650 mg rather 1000.  Also receiving PRN Percocet  - PT/OT already consulted.  Agree with CIR eval.  - Agree with watching Lantus   Alveda Reasons, MD 09/27/2013 1:25 PM

## 2013-09-28 ENCOUNTER — Inpatient Hospital Stay (HOSPITAL_COMMUNITY)
Admission: RE | Admit: 2013-09-28 | Discharge: 2013-10-03 | DRG: 945 | Disposition: A | Discharge status: Home / Self Care | Payer: Medicaid Other | Source: Intra-hospital | Attending: Physical Medicine & Rehabilitation | Admitting: Physical Medicine & Rehabilitation

## 2013-09-28 DIAGNOSIS — IMO0001 Reserved for inherently not codable concepts without codable children: Secondary | ICD-10-CM

## 2013-09-28 DIAGNOSIS — S88119A Complete traumatic amputation at level between knee and ankle, unspecified lower leg, initial encounter: Secondary | ICD-10-CM

## 2013-09-28 DIAGNOSIS — F209 Schizophrenia, unspecified: Secondary | ICD-10-CM | POA: Diagnosis present

## 2013-09-28 DIAGNOSIS — L02419 Cutaneous abscess of limb, unspecified: Secondary | ICD-10-CM | POA: Diagnosis present

## 2013-09-28 DIAGNOSIS — E1149 Type 2 diabetes mellitus with other diabetic neurological complication: Secondary | ICD-10-CM | POA: Diagnosis present

## 2013-09-28 DIAGNOSIS — D62 Acute posthemorrhagic anemia: Secondary | ICD-10-CM | POA: Diagnosis present

## 2013-09-28 DIAGNOSIS — E1142 Type 2 diabetes mellitus with diabetic polyneuropathy: Secondary | ICD-10-CM | POA: Diagnosis present

## 2013-09-28 DIAGNOSIS — K5900 Constipation, unspecified: Secondary | ICD-10-CM | POA: Diagnosis not present

## 2013-09-28 DIAGNOSIS — Z5189 Encounter for other specified aftercare: Secondary | ICD-10-CM | POA: Diagnosis present

## 2013-09-28 DIAGNOSIS — L03119 Cellulitis of unspecified part of limb: Secondary | ICD-10-CM | POA: Diagnosis present

## 2013-09-28 DIAGNOSIS — I10 Essential (primary) hypertension: Secondary | ICD-10-CM

## 2013-09-28 DIAGNOSIS — I739 Peripheral vascular disease, unspecified: Secondary | ICD-10-CM

## 2013-09-28 DIAGNOSIS — E1165 Type 2 diabetes mellitus with hyperglycemia: Secondary | ICD-10-CM

## 2013-09-28 DIAGNOSIS — Z794 Long term (current) use of insulin: Secondary | ICD-10-CM

## 2013-09-28 DIAGNOSIS — L98499 Non-pressure chronic ulcer of skin of other sites with unspecified severity: Secondary | ICD-10-CM

## 2013-09-28 DIAGNOSIS — Z89519 Acquired absence of unspecified leg below knee: Secondary | ICD-10-CM

## 2013-09-28 LAB — CBC
HCT: 27.9 % — ABNORMAL LOW (ref 39.0–52.0)
Hemoglobin: 9 g/dL — ABNORMAL LOW (ref 13.0–17.0)
MCH: 27.3 pg (ref 26.0–34.0)
MCHC: 32.3 g/dL (ref 30.0–36.0)
MCV: 84.5 fL (ref 78.0–100.0)
Platelets: 508 10*3/uL — ABNORMAL HIGH (ref 150–400)
RBC: 3.3 MIL/uL — ABNORMAL LOW (ref 4.22–5.81)
RDW: 12.7 % (ref 11.5–15.5)
WBC: 13.5 10*3/uL — ABNORMAL HIGH (ref 4.0–10.5)

## 2013-09-28 LAB — CULTURE, BLOOD (ROUTINE X 2)
Culture: NO GROWTH
Culture: NO GROWTH

## 2013-09-28 LAB — BASIC METABOLIC PANEL
Anion gap: 11 (ref 5–15)
BUN: 7 mg/dL (ref 6–23)
CO2: 28 mEq/L (ref 19–32)
Calcium: 8.2 mg/dL — ABNORMAL LOW (ref 8.4–10.5)
Chloride: 101 mEq/L (ref 96–112)
Creatinine, Ser: 0.95 mg/dL (ref 0.50–1.35)
GFR calc Af Amer: >90 mL/min (ref >90)
GFR calc non Af Amer: >90 mL/min (ref >90)
Glucose, Bld: 70 mg/dL (ref 70–99)
Potassium: 4.1 mEq/L (ref 3.7–5.3)
Sodium: 140 mEq/L (ref 137–147)

## 2013-09-28 LAB — GLUCOSE, CAPILLARY
Glucose-Capillary: 61 mg/dL — ABNORMAL LOW (ref 70–99)
Glucose-Capillary: 62 mg/dL — ABNORMAL LOW (ref 70–99)
Glucose-Capillary: 84 mg/dL (ref 70–99)
Glucose-Capillary: 152 mg/dL — ABNORMAL HIGH (ref 70–99)

## 2013-09-28 MED ORDER — ACETAMINOPHEN 325 MG PO TABS: 325.0000 mg | ORAL_TABLET | ORAL | Status: DC | PRN

## 2013-09-28 MED ORDER — HEPARIN SODIUM (PORCINE) 5000 UNIT/ML IJ SOLN: 5000.0000 [IU] | Freq: Three times a day (TID) | INTRAMUSCULAR | Status: DC

## 2013-09-28 MED ORDER — ACETAMINOPHEN 325 MG PO TABS
325.0000 mg | ORAL_TABLET | ORAL | Status: DC | PRN
  Administered 2013-09-29: 650 mg via ORAL
  Filled 2013-09-28: qty 2

## 2013-09-28 MED ORDER — PANTOPRAZOLE SODIUM 40 MG PO TBEC: 40.0000 mg | DELAYED_RELEASE_TABLET | Freq: Every day | ORAL | Status: DC

## 2013-09-28 MED ORDER — BRIMONIDINE TARTRATE 0.2 % OP SOLN: 1.0000 [drp] | Freq: Two times a day (BID) | OPHTHALMIC | Status: DC

## 2013-09-28 MED ORDER — POTASSIUM CHLORIDE CRYS ER 20 MEQ PO TBCR
20.0000 meq | EXTENDED_RELEASE_TABLET | Freq: Two times a day (BID) | ORAL | Status: DC
  Administered 2013-09-28 – 2013-09-30 (×5): 20 meq via ORAL
  Filled 2013-09-28 (×8): qty 1

## 2013-09-28 MED ORDER — GLUCERNA SHAKE PO LIQD
237.0000 mL | Freq: Three times a day (TID) | ORAL | Status: DC
  Administered 2013-09-28: 237 mL via ORAL

## 2013-09-28 MED ORDER — OXYCODONE HCL 5 MG PO TABS
5.0000 mg | ORAL_TABLET | ORAL | Status: DC | PRN
  Administered 2013-09-29: 10 mg via ORAL
  Administered 2013-09-30: 15 mg via ORAL
  Filled 2013-09-28: qty 3
  Filled 2013-09-28: qty 2

## 2013-09-28 MED ORDER — SORBITOL 70 % SOLN
30.0000 mL | Freq: Every day | Status: DC | PRN
  Administered 2013-09-30 – 2013-10-01 (×2): 30 mL via ORAL
  Filled 2013-09-28 (×2): qty 30

## 2013-09-28 MED ORDER — LISINOPRIL 40 MG PO TABS: 40.0000 mg | ORAL_TABLET | Freq: Every day | ORAL | Status: DC

## 2013-09-28 MED ORDER — INSULIN ASPART 100 UNIT/ML ~~LOC~~ SOLN
0.0000 [IU] | Freq: Three times a day (TID) | SUBCUTANEOUS | Status: DC
  Administered 2013-09-29: 2 [IU] via SUBCUTANEOUS
  Administered 2013-09-30: 3 [IU] via SUBCUTANEOUS
  Administered 2013-10-01 (×2): 2 [IU] via SUBCUTANEOUS
  Administered 2013-10-02: 3 [IU] via SUBCUTANEOUS
  Administered 2013-10-02 – 2013-10-03 (×2): 2 [IU] via SUBCUTANEOUS

## 2013-09-28 MED ORDER — ONDANSETRON HCL 4 MG/2ML IJ SOLN: 4.0000 mg | Freq: Four times a day (QID) | INTRAMUSCULAR | Status: DC | PRN

## 2013-09-28 MED ORDER — GUAIFENESIN-DM 100-10 MG/5ML PO SYRP: 15.0000 mL | ORAL_SOLUTION | ORAL | Status: DC | PRN

## 2013-09-28 MED ORDER — OXYCODONE HCL 5 MG PO TABS: 5.0000 mg | ORAL_TABLET | ORAL | Status: DC | PRN

## 2013-09-28 MED ORDER — ONDANSETRON HCL 4 MG PO TABS: 4.0000 mg | ORAL_TABLET | Freq: Four times a day (QID) | ORAL | Status: DC | PRN

## 2013-09-28 MED ORDER — PHENOL 1.4 % MT LIQD: 1.0000 | OROMUCOSAL | Status: DC | PRN

## 2013-09-28 MED ORDER — POTASSIUM CHLORIDE CRYS ER 20 MEQ PO TBCR: 20.0000 meq | EXTENDED_RELEASE_TABLET | Freq: Two times a day (BID) | ORAL | Status: DC

## 2013-09-28 MED ORDER — ALUM & MAG HYDROXIDE-SIMETH 200-200-20 MG/5ML PO SUSP: 15.0000 mL | ORAL | Status: DC | PRN

## 2013-09-28 MED ORDER — GABAPENTIN 300 MG PO CAPS
300.0000 mg | ORAL_CAPSULE | Freq: Three times a day (TID) | ORAL | Status: DC
  Administered 2013-09-28 – 2013-10-03 (×14): 300 mg via ORAL
  Filled 2013-09-28 (×17): qty 1

## 2013-09-28 MED ORDER — HEPARIN SODIUM (PORCINE) 5000 UNIT/ML IJ SOLN
5000.0000 [IU] | Freq: Three times a day (TID) | INTRAMUSCULAR | Status: DC
  Administered 2013-09-28 – 2013-10-03 (×14): 5000 [IU] via SUBCUTANEOUS
  Filled 2013-09-28 (×18): qty 1

## 2013-09-28 MED ORDER — BRIMONIDINE TARTRATE 0.2 % OP SOLN
1.0000 [drp] | Freq: Two times a day (BID) | OPHTHALMIC | Status: DC
  Administered 2013-09-28 – 2013-10-03 (×10): 1 [drp] via OPHTHALMIC
  Filled 2013-09-28: qty 5

## 2013-09-28 MED ORDER — HYDRALAZINE HCL 20 MG/ML IJ SOLN: 10.0000 mg | INTRAMUSCULAR | Status: DC | PRN

## 2013-09-28 MED ORDER — PHENOL 1.4 % MT LIQD
1.0000 | OROMUCOSAL | Status: DC | PRN
  Filled 2013-09-28: qty 177

## 2013-09-28 MED ORDER — INSULIN GLARGINE 100 UNIT/ML ~~LOC~~ SOLN
20.0000 [IU] | Freq: Every day | SUBCUTANEOUS | Status: DC
  Filled 2013-09-28: qty 0.2

## 2013-09-28 MED ORDER — GABAPENTIN 300 MG PO CAPS: 300.0000 mg | ORAL_CAPSULE | Freq: Three times a day (TID) | ORAL | Status: DC

## 2013-09-28 MED ORDER — PANTOPRAZOLE SODIUM 40 MG PO TBEC
40.0000 mg | DELAYED_RELEASE_TABLET | Freq: Every day | ORAL | Status: DC
Start: 2013-09-29 — End: 2013-10-03
  Administered 2013-09-29 – 2013-10-03 (×5): 40 mg via ORAL
  Filled 2013-09-28 (×7): qty 1

## 2013-09-28 MED ORDER — INSULIN GLARGINE 100 UNIT/ML ~~LOC~~ SOLN
20.0000 [IU] | Freq: Every day | SUBCUTANEOUS | Status: DC
Start: 2013-09-28 — End: 2014-11-10

## 2013-09-28 MED ORDER — ACETAMINOPHEN 325 MG RE SUPP: 325.0000 mg | RECTAL | Status: DC | PRN

## 2013-09-28 MED ORDER — DORZOLAMIDE HCL-TIMOLOL MAL 2-0.5 % OP SOLN: 1.0000 [drp] | Freq: Two times a day (BID) | OPHTHALMIC | Status: DC

## 2013-09-28 MED ORDER — DORZOLAMIDE HCL-TIMOLOL MAL 2-0.5 % OP SOLN
1.0000 [drp] | Freq: Two times a day (BID) | OPHTHALMIC | Status: DC
  Administered 2013-09-28 – 2013-10-03 (×10): 1 [drp] via OPHTHALMIC
  Filled 2013-09-28: qty 10

## 2013-09-28 MED ORDER — INSULIN ASPART 100 UNIT/ML ~~LOC~~ SOLN: 0.0000 [IU] | Freq: Three times a day (TID) | SUBCUTANEOUS | Status: DC

## 2013-09-28 MED ORDER — METOPROLOL TARTRATE 1 MG/ML IV SOLN: 2.0000 mg | INTRAVENOUS | Status: DC | PRN

## 2013-09-28 MED ORDER — ADULT MULTIVITAMIN W/MINERALS CH
1.0000 | ORAL_TABLET | Freq: Every day | ORAL | Status: DC
  Administered 2013-09-28: 1 via ORAL
  Filled 2013-09-28: qty 1

## 2013-09-28 NOTE — Discharge Instructions (Signed)
You were admitted for an infection in your right leg.  This did not heal with IV antibiotics.  You required surgery to remove your leg that had the infection.  You are going to a rehab facility to get stronger.  Cellulitis Cellulitis is an infection of the skin and the tissue beneath it. The infected area is usually red and tender. Cellulitis occurs most often in the arms and lower legs.  CAUSES  Cellulitis is caused by bacteria that enter the skin through cracks or cuts in the skin. The most common types of bacteria that cause cellulitis are staphylococci and streptococci. SIGNS AND SYMPTOMS   Redness and warmth.  Swelling.  Tenderness or pain.  Fever. DIAGNOSIS  Your health care provider can usually determine what is wrong based on a physical exam. Blood tests may also be done. TREATMENT  Treatment usually involves taking an antibiotic medicine. HOME CARE INSTRUCTIONS   Take your antibiotic medicine as directed by your health care provider. Finish the antibiotic even if you start to feel better.  Keep the infected arm or leg elevated to reduce swelling.  Apply a warm cloth to the affected area up to 4 times per day to relieve pain.  Take medicines only as directed by your health care provider.  Keep all follow-up visits as directed by your health care provider. SEEK MEDICAL CARE IF:   You notice red streaks coming from the infected area.  Your red area gets larger or turns dark in color.  Your bone or joint underneath the infected area becomes painful after the skin has healed.  Your infection returns in the same area or another area.  You notice a swollen bump in the infected area.  You develop new symptoms.  You have a fever. SEEK IMMEDIATE MEDICAL CARE IF:   You feel very sleepy.  You develop vomiting or diarrhea.  You have a general ill feeling (malaise) with muscle aches and pains. MAKE SURE YOU:   Understand these instructions.  Will watch your  condition.  Will get help right away if you are not doing well or get worse. Document Released: 11/29/2004 Document Revised: 07/06/2013 Document Reviewed: 05/07/2011 [facility] Patient Information 2015 [place], [place]. This information is not intended to replace advice given to you by your health care provider. Make sure you discuss any questions you have with your health care provider.    Amputation Many new amputations occur each year. The most common causes of amputation of the lower extremity (the hip down) are:  Disease.  Injury caused in an accidents or wars (trauma).  Birth defects.  Lumps (tumors) that are cancer. Upper extremity amputation is usually the result of trauma or birth defect, with disease being a less common cause. COMMON PROBLEMS After an amputation a number of issues need to be considered. Getting around and self-care are early problems that must be dealt with. A complete rehabilitation program will help the amputee recover mobility. A team approach of caregivers helps the most. Caregivers that can provide a well rounded program include:   Physicians.  Therapists.  Nurses.  Social workers.  Psychologists. Usually there are problems with body image and coping with lifestyle changes. A grieving period similar to dealing with a death in the family is common after an amputation. Talking to a trained professional with experience in treating people with similar problems can be very helpful. When returning to a previous lifestyle, questions about sexuality can arise. Many of these uncertainties are normal. These can be  discussed with your psychologist or rehabilitation specialist. REHABILITATION AND RETURN TO WORK AND ACTIVITIES Returning to recreational activities and employment are part of recovery. Many times, changes to recreation equipment can allow return to a sport or hobby. A device that substitutes the missing part of the body is called a prosthetic. Many  prosthetic manufacturers produce components designed for sports. Be sure to discuss all of your leisure interests with your prosthetist. This is the person who helps provide you with custom made replacement limbs. Your physician will also help to select a prosthetic that will meet your needs. Employers will vary in their willingness to change a work environment in order to help people with disabilities. Your therapists can perform job site evaluations. Your therapist can then make recommendations to help with your work area. Some amputees will not be able to return to previous jobs. Your local Office of Vocational Rehabilitation can assist you in job retraining.  Once you are past the initial rehabilitation stage you will have ongoing contact with caregivers and a prosthetist. You need to work closely with them in making decisions about your prosthetic device. PROGNOSIS  Amputation should not end your joy of life. There are people with limb loss in nearly all walks of life. They are in a wide variety of professions. They participate in nearly all sports. Ask your caregivers about support groups and sports organizations in your area. They can help you with referral to organizations that will be helpful to you. Document Released: 11/11/2001 Document Revised: 05/14/2011 Document Reviewed: 01/06/2007 [facility] Patient Information 2015 [place], [place]. This information is not intended to replace advice given to you by your health care provider. Make sure you discuss any questions you have with your health care provider.

## 2013-09-28 NOTE — Progress Notes (Signed)
I have seen and examined this patient. I have discussed with Dr Bacigalupo.  I agree with their findings and plans as documented in their progress note.    

## 2013-09-28 NOTE — Progress Notes (Signed)
Pt transferred to Rehab. Diagnostic specific packet provided. Orientated to rehab expectatiosn. Discussed at length, therapy schedule, visiting hrs, meal time, safety etc. Pt anticipating beginning therapy in the a.m.

## 2013-09-28 NOTE — Progress Notes (Signed)
PMR Admission Coordinator Pre-Admission Assessment  Patient: [name] is an 58 y.o., male  MRN: [id number]  DOB: 09-03-1955  Height: 6' (182.9 cm)  Weight: 98.385 kg (216 lb 14.4 oz)  Insurance Information  HMO: PPO: PCP: IPA: 80/20: OTHER:  PRIMARY: Medicaid [employer] access Policy#: [id number] p Subscriber: Carlena Sax  CM Name: Phone#: Fax#:  Pre-Cert#: Employer: Not employed  Benefits: Phone #: [phone] Name: Automated  Eff. Date: Eligible 09/28/13 Deduct: Out of Pocket Max: Life Max:  CIR: SNF:  Outpatient: Co-Pay:  Home Health: Co-Pay:  DME: Co-Pay:  Providers:  Emergency Contact Information  Contact Information    Name  Relation  Home  Work  Mobile    [place]  Sister  [phone]        Current Medical History  Patient Admitting Diagnosis: R BKA  History of Present Illness: A 58 y.o. right-handed male with history of hypertension, schizophrenia, diabetes mellitus with peripheral neuropathy and right lower extremity cellulitis. Patient lives alone independent prior to admission and sedentary. Presented 09/19/2013 with right foot also and soft tissue infection. MRI of right foot showed abnormal diffuse subcutaneous and muscular edema consistent with cellulitis. Patient underwent extensive debridement 09/24/2013 per Dr. Percell Miller and placed on antibiotic therapy. 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 Marcelino Scot as well as repeat debridement of wound. Hospital course pain management. Subcutaneous heparin for DVT prophylaxis. Acute blood loss anemia 9.3 and monitored. Physical therapy evaluation completed 09/27/2013 with recommendations for physical medicine rehabilitation consult.  Past Medical History  Past Medical History   Diagnosis  Date   .  Diabetes mellitus    .  Hypertension     Family History  family history is not on file.  Prior Rehab/Hospitalizations: None  Current Medications  Current  facility-administered medications:acetaminophen (TYLENOL) suppository 325-650 mg, 325-650 mg, Rectal, Q4H PRN, Lind Covert, MD; acetaminophen (TYLENOL) tablet 325-650 mg, 325-650 mg, Oral, Q4H PRN, Lind Covert, MD; alum & mag hydroxide-simeth (MAALOX/MYLANTA) 200-200-20 MG/5ML suspension 15-30 mL, 15-30 mL, Oral, Q2H PRN, Jari Pigg, PA-C  brimonidine (ALPHAGAN) 0.2 % ophthalmic solution 1 drop, 1 drop, Left Eye, BID, Leeanne Rio, MD, 1 drop at 09/28/13 1017; dorzolamide-timolol (COSOPT) 22.3-6.8 MG/ML ophthalmic solution 1 drop, 1 drop, Left Eye, BID, Leeanne Rio, MD, 1 drop at 09/28/13 1017; gabapentin (NEURONTIN) capsule 300 mg, 300 mg, Oral, TID, Jari Pigg, PA-C, 300 mg at 09/28/13 1017  guaiFENesin-dextromethorphan (ROBITUSSIN DM) 100-10 MG/5ML syrup 15 mL, 15 mL, Oral, Q4H PRN, Jari Pigg, PA-C; heparin injection 5,000 Units, 5,000 Units, Subcutaneous, 3 times per day, Leeanne Rio, MD, 5,000 Units at 09/28/13 [phone]; hydrALAZINE (APRESOLINE) injection 10 mg, 10 mg, Intravenous, Q2H PRN, Jari Pigg, PA-C  insulin aspart (novoLOG) injection 0-15 Units, 0-15 Units, Subcutaneous, TID WC, Leeanne Rio, MD, 3 Units at 09/27/13 1208; insulin glargine (LANTUS) injection 20 Units, 20 Units, Subcutaneous, QHS, Lavon Paganini, MD; lactated ringers infusion, , Intravenous, Continuous, Jari Pigg, PA-C, Last Rate: 100 mL/hr at 09/27/13 2306; [START ON 09/29/2013] lisinopril (PRINIVIL,ZESTRIL) tablet 40 mg, 40 mg, Oral, Daily, Lavon Paganini, MD  metoprolol (LOPRESSOR) injection 2-5 mg, 2-5 mg, Intravenous, Q2H PRN, Jari Pigg, PA-C; ondansetron [facility]) injection 4 mg, 4 mg, Intravenous, Q6H PRN, Leeanne Rio, MD; ondansetron [facility]) injection 4 mg, 4 mg, Intravenous, Q6H PRN, Jari Pigg, PA-C; ondansetron [facility]) tablet 4 mg, 4 mg, Oral, Q6H PRN, Leeanne Rio, MD  oxyCODONE (Oxy IR/ROXICODONE) immediate  release tablet 5-20 mg, 5-20 mg,  Oral, Q4H PRN, Jari Pigg, PA-C, 20 mg at 09/28/13 1139; pantoprazole (PROTONIX) EC tablet 40 mg, 40 mg, Oral, Daily, Jari Pigg, PA-C, 40 mg at 09/28/13 1020; phenol (CHLORASEPTIC) mouth spray 1 spray, 1 spray, Mouth/Throat, PRN, Jari Pigg, PA-C; potassium chloride SA (K-DUR,KLOR-CON) CR tablet 20 mEq, 20 mEq, Oral, BID, Lavon Paganini, MD, 20 mEq at 09/28/13 1017  Patients Current Diet: Heart Healthy/Carb modified, thin liquids  Precautions / Restrictions  Precautions  Precautions: Fall  Precaution Comments: educated on precautions  Restrictions  Weight Bearing Restrictions: Yes  RLE Weight Bearing: Non weight bearing  Other Position/Activity Restrictions: elevate operated leg, do not prop knee with pillows,  Prior Activity Level  Community (5-7x/wk): Went out daily 1 pm to 5 pm to the store, for walks, to run errands. Took the city bus as needed.  Home Assistive Devices / Equipment  Home Assistive Devices/Equipment: CBG Meter  Home Equipment: None  Prior Functional Level  Prior Function  Level of Independence: Independent  Current Functional Level  Cognition  Overall Cognitive Status: History of cognitive impairments - at baseline (slow processing)  Orientation Level: Oriented to person;Oriented to place;Disoriented to time;Disoriented to situation   Extremity Assessment  (includes Sensation/Coordination)  Upper Extremity Assessment: Overall WFL for tasks assessed  Lower Extremity Assessment: RLE deficits/detail  RLE Deficits / Details: bulky ACE wrap limiting knee flexion but extension appears intact. otherwise strong   ADLs  Overall ADL's : Needs assistance/impaired  Grooming: Set up;Sitting  Lower Body Bathing: Minimal assistance;Sit to/from stand  Lower Body Dressing: Min guard;Sit to/from stand  Toilet Transfer: Minimal assistance;Stand-pivot;RW;BSC  Functional mobility during ADLs: Minimal assistance;Rolling walker  General ADL Comments: Educated on alternative  technique for LB ADLs (leaning or in bed). Educated on desensitization techniques for RLE. Elevated RLE at end of session. Pt washed off LB and peri area during session and donned/doffed pants. Educated on chair push ups as pt is going to have to be using UB a lot for transfers and mobility.   Mobility  Overal bed mobility: Modified Independent  General bed mobility comments: bridges, moves to/from EOB, scoots without physical assist   Transfers  Overall transfer level: Needs assistance  Equipment used: Rolling walker (2 wheeled)  Transfers: Sit to/from [facility]  Sit to Stand: Min guard  Stand pivot transfers: Min assist  General transfer comment: Pt performed stand pivot to [facility] and then hopped to chair. Cues for technique.   Ambulation / Gait / Stairs / Wheelchair Mobility  Ambulation/Gait  General Gait Details: not assessed or described   Posture / Balance    Special needs/care consideration  BiPAP/CPAP No  CPM No  Continuous Drip IV No  Dialysis No  Life Vest No  Oxygen No  Special Bed No  Trach Size No  Wound Vac (area) No  Skin New R BKA incision with dressing and ace wrap intact  Bowel mgmt: Last BM 09/27/13  Bladder mgmt: Voiding in urinal  Diabetic mgmt Yes, on insulin at home   Previous Home Environment  Living Arrangements: Alone  Available Help at Discharge: Available PRN/intermittently;Family  Type of Home: Apartment  Home Layout: One level  Home Access: Level entry (lives on first floor)  Bathroom Shower/Tub: Administrator, Civil Service: Standard  Home Care Services: No  Additional Comments: pt cannot go live with sister per his report  Discharge Living Setting  Plans for Discharge Living Setting: Alone;Apartment  Type of  Home at Discharge: Apartment (1st floor apartment.)  Discharge Home Layout: One level  Discharge Home Access: Level entry  Does the patient have any problems obtaining your medications?: No  Social/Family/Support Systems   Patient Roles: Other (Comment) (Has a sister, Alice.)  Contact Information: Delbert Phenix - sister [phone]  Anticipated Caregiver: self  Ability/Limitations of Caregiver: Patient hopes to discharge to his apartment. Did not want to go home with his sister  Caregiver Availability: Intermittent  Discharge Plan Discussed with Primary Caregiver: Yes  Is Caregiver In Agreement with Plan?: Yes  Does Caregiver/Family have Issues with Lodging/Transportation while Pt is in Rehab?: No  Goals/Additional Needs  Patient/Family Goal for Rehab: PT/OT supervision goals  Expected length of stay: 7-10 days  Cultural Considerations: None, Does have a history of schizophrenia.  Dietary Needs: Heart healthy, carb mod., thin liquids  Equipment Needs: TBD  Pt/Family Agrees to Admission and willing to participate: Yes  Program Orientation Provided & Reviewed with Pt/Caregiver Including Roles & Responsibilities: Yes  Decrease burden of Care through IP rehab admission: N/A  Possible need for SNF placement upon discharge: Not anticipated  Patient Condition: This patient's condition remains as documented in the consult dated 09/28/13, in which the Rehabilitation Physician determined and documented that the patient's condition is appropriate for intensive rehabilitative care in an inpatient rehabilitation facility. Will admit to inpatient rehab today.  Preadmission Screen Completed By: Retta Diones, 09/28/2013 12:15 PM  ______________________________________________________________________  Discussed status with Dr. Naaman Plummer on 09/28/13 at 1214 and received telephone approval for admission today.  Admission Coordinator: Retta Diones, time1214/Date07/27/15  Cosigned by: Meredith Staggers, MD [09/28/2013 12:21 PM]

## 2013-09-28 NOTE — Progress Notes (Signed)
Family Medicine Teaching Service Daily Progress Note Intern Pager: [phone]  Patient name: [name] Medical record number: [id number] Date of birth: 26-Oct-1955 Age: 58 y.o. Gender: male  Primary Care Provider: Tawanna Sat, MD Consultants: Ortho, Vascular surgery Code Status: Full code  Pt Overview and Major Events to Date:  7/19 Admit for IV Abx (Vanc/Zosyn) for RLE cellulitis and diabetic management 7/20 Febrile multiple times O/N -> Ortho c/s for possible debridement 7/21 still febrile, switch Zosyn to Imipenem 7/22 Ortho re-consulted as showing no improvement on IV abx 7/23 I&D 7/25 BKA  Assessment and Plan:  [name] is a 58 y.o. male presenting with RLE Cellulitis. PMH is significant for T2DM, HTN, and Schizophrenia.   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 - Vanc/imipenem restarted per Ortho recs (7/26), will d/c on 7/28 - Continue to monitor WBC trend and fever curve - Wound cultures (7/23): Multiple organisms, none predominant, no anaerobes isolated - Continue oxyIR 5-20mg  q4h prn pain and Gabapentin 300mg  TID - Orthopedics recommendations appreciated   T2DM, uncontrolled: Last A1c 13.3. Not taking medications regularly at home. Fasting CBG today: 61 (NPO much of yesterday).  May improve now that patient is eating well again. - Continue Lantus 30units qhs and moderate SSI, adjust requirement as indicated  Loose stools: Likely related to miralax that was given for constipation. - C diff PCR negative - No intervention given ongoing opioids  Thrombocytosis: Mild, stable. Likely reactive thrombocytosis in setting of infection. - Continue to monitor  Mild normocytic anemia:  Stable around Hgb 9 s/p BKA. - Consider to monitor and consider further w/u as outpatient  HTN: BP stable, hypertensive. D/c'd Norvasc 7/24 as patient reports generalized fatigue and weakness  that he attributes to this medication. - Consider increasing Lisinopril dose to 40mg  if continues to be hypertensive.  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 Pupil non-reactive and dilated. Followed by ophtho as an outpatient.  - Continue home Cosopt and Alphagan drops   AG Metabolic alkalosis: Resolved.  Hyponatremia: Resolved.  Mild Hypokalemia: Resolved.   FEN/GI: Heart healthy/Carb modified diet, KVO Prophylaxis: SQ Heparin  Disposition: Discharge to CIR pending clinical stability and eligibility - early this week.    Subjective:  Patient more talkative today, reporting feeling much better. Pain is well controlled. No fevers, SOB, CP.  He is looking forward to learning whether he is accepted to CIR.  Currently enjoying breakfast of pancakes and sausage.  Objective: Temp:  [98.1 F (36.7 C)-100.4 F (38 C)] 99.2 F (37.3 C) (07/27 0601) Pulse Rate:  [72-78] 73 (07/27 0601) Resp:  [14-18] 14 (07/27 0601) BP: (139-159)/(72-91) 147/91 mmHg (07/27 0601) SpO2:  [95 %-97 %] 96 % (07/27 0601) Physical Exam: General: Pleasant 58 y.o. male laying in bed in NAD Cardiovascular: RRR, no m/r/g. No JVD Respiratory: CTAB, no w/r/c. Normal WOB  Abdomen: Soft, ND/NT, NABS, no HSM  Extremities: s/p R BKA, dressing intact with mild bloody discharge.  Neuro/Psych: Alert & O x3,  flat affect, no evidence of responding to internal stimuli. Mood is "better"  Laboratory:  Recent Labs Lab 09/27/13 0519 09/27/13 1812 09/28/13 0646  WBC 14.5* 17.5* 13.5*  HGB 9.8* 9.3* 9.0*  HCT 31.0* 29.5* 27.9*  PLT 516* 587* 508*    Recent Labs Lab 09/26/13 0342 09/27/13 0519 09/28/13 0646  NA 146 145 140  K 3.7 4.0 4.1  CL 102 105 101  CO2 31 28 28   BUN 10 10 7   CREATININE 0.84 0.96 0.95  CALCIUM 9.1 8.5 8.2*  GLUCOSE 48* 71 70    Wound cultures (7/23): Multiple organisms, none predominant, no anaerobes isolated  Imaging/Diagnostic Tests: R ankle XRay  (7/18): No fracture or dislocation is noted. Soft tissue swelling is seen  over lateral malleolus suggesting ligamentous injury.   R foot XRay (7/18): Soft tissue swelling and ulceration adjacent to the base of the  fifth metatarsal. No underlying osseous abnormality identified.  MRI R foot and ankle (7/22): 1. Abnormal diffuse subcutaneous and muscular edema in the foot.  Cellulitis and myositis not excluded. Deep in the subcutaneous  tissues and tracking along some of the distal lateral musculature in  the foot, there is infiltrative high T1 signal favoring hematoma.  Infectious phlegmon in this vicinity is not excluded although no  discrete drainable soft tissue abscess is seen deep within the foot.  No definite osteomyelitis.  2. Blistering and ulceration along the lateral forefoot.  3. Tenosynovitis and partial tearing of the peroneus longus.  Peroneus brevis tendinopathy.  4. Mild distal tibialis posterior tenosynovitis.  5. Low-level edema in the base of the fifth metatarsal, likely  Reactive.   Jake Paganini, MD 09/28/2013, 8:18 AM PGY-1, [place] Intern pager: [phone], text pages welcome

## 2013-09-28 NOTE — H&P (Signed)
Physical Medicine and Rehabilitation Admission H&P  Chief Complaint   Patient presents with   .  Foot Pain   :  HPI: [name] is a 57 y.o. right-handed male with history of hypertension, schizophrenia, diabetes mellitus with peripheral neuropathy and right lower extremity cellulitis. Patient lives alone independent prior to admission and sedentary. Presented 09/19/2013 with right foot also and soft tissue infection. MRI of right foot showed abnormal diffuse subcutaneous and muscular edema consistent with cellulitis. Patient underwent extensive debridement 09/24/2013 per Dr. Murphy and placed on antibiotic therapy. 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 Handy as well as repeat debridement of wound. Hospital course pain management. Stump shrinker ordered per advanced prosthetics. Subcutaneous heparin for DVT prophylaxis. Acute blood loss anemia 9.3 and monitored. Physical and occupational therapy evaluations completed 09/27/2013 with recommendations for physical medicine rehabilitation consult. Patient was admitted for comprehensive rehabilitation program    ROS Review of Systems  Gastrointestinal: Positive for constipation.  Musculoskeletal: Positive for joint pain and myalgias.  Neurological: Positive for weakness.  Psychiatric/Behavioral:  Schizophrenia  All other systems reviewed and are negative  Past Medical History   Diagnosis  Date   .  Diabetes mellitus    .  Hypertension     Past Surgical History   Procedure  Laterality  Date   .  I&d extremity  Right  09/24/2013     Procedure: IRRIGATION AND DEBRIDEMENT RIGHT FOOT ULCER; Surgeon: Timothy D Murphy, MD; Location: [facility] OR; Service: Orthopedics; Laterality: Right;    History reviewed. No pertinent family history.  Social History: reports that he has never smoked. He does not have any smokeless tobacco history on file. He reports that he does not drink alcohol or use  illicit drugs.  Allergies: No Known Allergies  Medications Prior to Admission   Medication  Sig  Dispense  Refill   .  ACCU-CHEK AVIVA PLUS test strip  USE AS INSTRUCTED  100 each  4   .  amLODipine (NORVASC) 10 MG tablet  Take 1 tablet (10 mg total) by mouth daily.  90 tablet  3   .  brimonidine (ALPHAGAN) 0.15 % ophthalmic solution  Place 1 drop into the left eye 2 (two) times daily.     .  dorzolamide-timolol (COSOPT) 22.3-6.8 MG/ML ophthalmic solution  Place 1 drop into the left eye 2 (two) times daily.     .  ibuprofen (ADVIL,MOTRIN) 200 MG tablet  Take 200 mg by mouth every 6 (six) hours as needed.     .  INS SYRINGE/NEEDLE 1CC/29G ([employer] INSULIN SYRINGE 1CC/29G) 29G X 1/2" 1 ML MISC  by Does not apply route.     .  insulin aspart (NOVOLOG) 100 UNIT/ML injection  Inject 5 Units into the skin 3 (three) times daily before meals. Do not take if sugar is <150  3 vial  PRN   .  insulin glargine (LANTUS) 100 UNIT/ML injection  Inject 0.5 mLs (50 Units total) into the skin at bedtime.  20 mL  3   .  Lancet Devices (SOFT TOUCH LANCET DEVICE) MISC  by Does not apply route.     .  Lancets (ACCU-CHEK SOFT TOUCH) lancets  1 each by Other route as needed. Use as instructed     .  lisinopril (PRINIVIL,ZESTRIL) 10 MG tablet  Take 1 tablet (10 mg total) by mouth daily.  90 tablet  3   .  Tetrahydrozoline HCl (VISINE OP)  Apply 1   drop to eye 2 (two) times daily. Only uses in left eye.      Home:  Home Living  Family/patient expects to be discharged to:: Private residence  Living Arrangements: Alone  Available Help at Discharge: Available PRN/intermittently;Family  Type of Home: Apartment  Home Access: Level entry (lives on first floor)  Home Layout: One level  Home Equipment: None  Additional Comments: pt cannot go live with sister per his report  Functional History:  Prior Function  Level of Independence: Independent  Functional Status:  Mobility:  Bed Mobility  Overal bed mobility: Modified  Independent  General bed mobility comments: bridges, moves to/from EOB, scoots without physical assist  Transfers  Overall transfer level: Needs assistance  Equipment used: Rolling walker (2 wheeled)  Transfers: Sit to/from Stand;Stand Pivot Transfers  Sit to Stand: Min guard  Stand pivot transfers: Min assist  General transfer comment: Pt performed stand pivot to BSC and then hopped to chair. Cues for technique.  Ambulation/Gait  General Gait Details: not assessed or described   ADL:  ADL  Overall ADL's : Needs assistance/impaired  Grooming: Set up;Sitting  Lower Body Bathing: Minimal assistance;Sit to/from stand  Lower Body Dressing: Min guard;Sit to/from stand  Toilet Transfer: Minimal assistance;Stand-pivot;RW;BSC  Functional mobility during ADLs: Minimal assistance;Rolling walker  General ADL Comments: Educated on alternative technique for LB ADLs (leaning or in bed). Educated on desensitization techniques for RLE. Elevated RLE at end of session. Pt washed off LB and peri area during session and donned/doffed pants. Educated on chair push ups as pt is going to have to be using UB a lot for transfers and mobility.  Cognition:  Cognition  Overall Cognitive Status: History of cognitive impairments - at baseline (slow processing)  Orientation Level: Oriented to person;Oriented to place;Disoriented to time;Disoriented to situation  Cognition  Arousal/Alertness: Awake/alert  Behavior During Therapy: WFL for tasks assessed/performed  Overall Cognitive Status: History of cognitive impairments - at baseline (slow processing)    Physical Exam:  Blood pressure 147/91, pulse 73, temperature 99.2 F (37.3 C), temperature source Oral, resp. rate 14, height 6' (1.829 m), weight 98.385 kg (216 lb 14.4 oz), SpO2 96.00%.    Constitutional: He is oriented to person, place, and time. He appears well-developed.  HENT: oral mucosa pink, moist, dentition fair Head: Normocephalic.  Eyes: EOM are  normal.  Neck: Normal range of motion. Neck supple. No thyromegaly present.  Cardiovascular: Normal rate and regular rhythm.  Respiratory: Effort normal and breath sounds normal. No respiratory distress.  GI: Soft. Bowel sounds are normal. He exhibits no distension.  Neurological: He is alert and oriented to person, place, and time.  Mood is flat but appropriate. Intact basic insight and awareness. CN exam intact. UES 5/5 proximal to distal. LLE: 4/5 HF, 4+ KE and 5/5 left ADF/APF Skin:  Right BKA site is dressed appropriately tender, mild drainage from incision, leg with localized swelling. Psych: affect flat, cooperative   Results for orders placed during the hospital encounter of 09/19/13 (from the past 48 hour(s))   GLUCOSE, CAPILLARY Status: Abnormal    Collection Time    09/26/13 12:13 PM   Result  Value  Ref Range    Glucose-Capillary  113 (*)  70 - 99 mg/dL    Comment 1  Documented in Chart    GLUCOSE, CAPILLARY Status: Abnormal    Collection Time    09/26/13 4:55 PM   Result  Value  Ref Range    Glucose-Capillary  196 (*)    70 - 99 mg/dL   GLUCOSE, CAPILLARY Status: Abnormal    Collection Time    09/26/13 9:29 PM   Result  Value  Ref Range    Glucose-Capillary  124 (*)  70 - 99 mg/dL   BASIC METABOLIC PANEL Status: None    Collection Time    09/27/13 5:19 AM   Result  Value  Ref Range    Sodium  145  137 - 147 mEq/L    Potassium  4.0  3.7 - 5.3 mEq/L    Chloride  105  96 - 112 mEq/L    CO2  28  19 - 32 mEq/L    Glucose, Bld  71  70 - 99 mg/dL    BUN  10  6 - 23 mg/dL    Creatinine, Ser  0.96  0.50 - 1.35 mg/dL    Calcium  8.5  8.4 - 10.5 mg/dL    GFR calc non Af Amer  >90  >90 mL/min    GFR calc Af Amer  >90  >90 mL/min    Comment:  (NOTE)     The eGFR has been calculated using the CKD EPI equation.     This calculation has not been validated in all clinical situations.     eGFR's persistently <90 mL/min signify possible Chronic Kidney     Disease.    Anion gap   12  5 - 15   CBC Status: Abnormal    Collection Time    09/27/13 5:19 AM   Result  Value  Ref Range    WBC  14.5 (*)  4.0 - 10.5 K/uL    RBC  3.69 (*)  4.22 - 5.81 MIL/uL    Hemoglobin  9.8 (*)  13.0 - 17.0 g/dL    HCT  31.0 (*)  39.0 - 52.0 %    MCV  84.0  78.0 - 100.0 fL    MCH  26.6  26.0 - 34.0 pg    MCHC  31.6  30.0 - 36.0 g/dL    RDW  12.9  11.5 - 15.5 %    Platelets  516 (*)  150 - 400 K/uL   GLUCOSE, CAPILLARY Status: Abnormal    Collection Time    09/27/13 7:50 AM   Result  Value  Ref Range    Glucose-Capillary  67 (*)  70 - 99 mg/dL   GLUCOSE, CAPILLARY Status: Abnormal    Collection Time    09/27/13 8:50 AM   Result  Value  Ref Range    Glucose-Capillary  144 (*)  70 - 99 mg/dL   GLUCOSE, CAPILLARY Status: Abnormal    Collection Time    09/27/13 11:52 AM   Result  Value  Ref Range    Glucose-Capillary  178 (*)  70 - 99 mg/dL   GLUCOSE, CAPILLARY Status: Abnormal    Collection Time    09/27/13 4:56 PM   Result  Value  Ref Range    Glucose-Capillary  101 (*)  70 - 99 mg/dL   CBC Status: Abnormal    Collection Time    09/27/13 6:12 PM   Result  Value  Ref Range    WBC  17.5 (*)  4.0 - 10.5 K/uL    RBC  3.54 (*)  4.22 - 5.81 MIL/uL    Hemoglobin  9.3 (*)  13.0 - 17.0 g/dL    HCT  29.5 (*)  39.0 - 52.0 %    MCV  83.3    78.0 - 100.0 fL    MCH  26.3  26.0 - 34.0 pg    MCHC  31.5  30.0 - 36.0 g/dL    RDW  12.7  11.5 - 15.5 %    Platelets  587 (*)  150 - 400 K/uL   GLUCOSE, CAPILLARY Status: Abnormal    Collection Time    09/27/13 10:22 PM   Result  Value  Ref Range    Glucose-Capillary  145 (*)  70 - 99 mg/dL   BASIC METABOLIC PANEL Status: Abnormal    Collection Time    09/28/13 6:46 AM   Result  Value  Ref Range    Sodium  140  137 - 147 mEq/L    Potassium  4.1  3.7 - 5.3 mEq/L    Chloride  101  96 - 112 mEq/L    CO2  28  19 - 32 mEq/L    Glucose, Bld  70  70 - 99 mg/dL    BUN  7  6 - 23 mg/dL    Creatinine, Ser  0.95  0.50 - 1.35 mg/dL    Calcium   8.2 (*)  8.4 - 10.5 mg/dL    GFR calc non Af Amer  >90  >90 mL/min    GFR calc Af Amer  >90  >90 mL/min    Comment:  (NOTE)     The eGFR has been calculated using the CKD EPI equation.     This calculation has not been validated in all clinical situations.     eGFR's persistently <90 mL/min signify possible Chronic Kidney     Disease.    Anion gap  11  5 - 15   CBC Status: Abnormal    Collection Time    09/28/13 6:46 AM   Result  Value  Ref Range    WBC  13.5 (*)  4.0 - 10.5 K/uL    RBC  3.30 (*)  4.22 - 5.81 MIL/uL    Hemoglobin  9.0 (*)  13.0 - 17.0 g/dL    HCT  27.9 (*)  39.0 - 52.0 %    MCV  84.5  78.0 - 100.0 fL    MCH  27.3  26.0 - 34.0 pg    MCHC  32.3  30.0 - 36.0 g/dL    RDW  12.7  11.5 - 15.5 %    Platelets  508 (*)  150 - 400 K/uL   GLUCOSE, CAPILLARY Status: Abnormal    Collection Time    09/28/13 8:17 AM   Result  Value  Ref Range    Glucose-Capillary  61 (*)  70 - 99 mg/dL   GLUCOSE, CAPILLARY Status: Abnormal    Collection Time    09/28/13 9:30 AM   Result  Value  Ref Range    Glucose-Capillary  62 (*)  70 - 99 mg/dL   GLUCOSE, CAPILLARY Status: None    Collection Time    09/28/13 10:20 AM   Result  Value  Ref Range    Glucose-Capillary  84  70 - 99 mg/dL    Dg Tibia/fibula Right Port  09/27/2013 CLINICAL DATA: 58 year old male -right BKA. EXAM: PORTABLE RIGHT TIBIA AND FIBULA - 2 VIEW COMPARISON: None. FINDINGS: BKA changes noted. No complicating features are identified. Mild degenerative changes in the knee are present. IMPRESSION: BKA without complicating features. Electronically Signed By: Hassan Rowan M.D. On: 09/27/2013 16:51   Medical Problem List and Plan:  1. Functional deficits secondary to right  BKA 09/26/2013 secondary to progressive ischemia. Stump shrinker ordered per advanced prosthetics.  2. DVT Prophylaxis/Anticoagulation: Subcutaneous heparin. Monitor platelet counts and any signs of bleeding  3. Pain Management: Neurontin 300 mg 3 times a day,  oxycodone as needed. Monitor with increased mobility  4. Mood/schizophrenia: Patient appears at baseline  5. Neuropsych: This patient is capable of making decisions on his own behalf.  6. Skin/Wound Care: Routine skin care. Monitor right below-knee amputation site.  7. Acute blood loss anemia. Followup CBC  8. Hypertension. Lisinopril 20 mg daily. Monitor with increased mobility  9. Diabetes mellitus with peripheral neuropathy. Latest hemoglobin A1c of 14. Lantus insulin 30 units each bedtime. Check blood sugars a.c. and at bedtime. Provide diabetic teaching    Post Admission Physician Evaluation:  1. Functional deficits secondary to right BKA. 2. Patient is admitted to receive collaborative, interdisciplinary care between the physiatrist, rehab nursing staff, and therapy team. 3. Patient's level of medical complexity and substantial therapy needs in context of that medical necessity cannot be provided at a lesser intensity of care such as a SNF. 4. Patient has experienced substantial functional loss from his/her baseline which was documented above under the "Functional History" and "Functional Status" headings. Judging by the patient's diagnosis, physical exam, and functional history, the patient has potential for functional progress which will result in measurable gains while on inpatient rehab. These gains will be of substantial and practical use upon discharge in facilitating mobility and self-care at the household level. 5. Physiatrist will provide 24 hour management of medical needs as well as oversight of the therapy plan/treatment and provide guidance as appropriate regarding the interaction of the two. 6. 24 hour rehab nursing will assist with bladder management, bowel management, safety, skin/wound care, disease management, medication administration, pain management and patient education and help integrate therapy concepts, techniques,education, etc. 7. PT will assess and treat for/with:  Lower extremity strength, range of motion, stamina, balance, functional mobility, safety, adaptive techniques and equipment, pre-prosthetic education, pain mgt, skin care, leisure awareness. Goals are: mod I. 8. OT will assess and treat for/with: ADL's, functional mobility, safety, upper extremity strength, adaptive techniques and equipment, pain mgt, stump protection, community reintegration. Goals are: mod I. 9. SLP will assess and treat for/with: n/a. Goals are: n/a. 10. Case Management and Social Worker will assess and treat for psychological issues and discharge planning. 11. Team conference will be held weekly to assess progress toward goals and to determine barriers to discharge. 12. Patient will receive at least 3 hours of therapy per day at least 5 days per week. 13. ELOS: 5-7 days  14. Prognosis: excellent  Meredith Staggers, MD, [facility]   09/28/2013

## 2013-09-28 NOTE — PMR Pre-admission (Signed)
PMR Admission Coordinator Pre-Admission Assessment  Patient: [name] is an 58 y.o., male MRN: [id number] DOB: 11/29/1955 Height: 6' (182.9 cm) Weight: 98.385 kg (216 lb 14.4 oz)              Insurance Information HMO:      PPO:       PCP:       IPA:       80/20:       OTHER:   PRIMARY: Medicaid [place] access      Policy#: [id number]      Subscriber: Carlena Sax CM Name:        Phone#:       Fax#:   Pre-Cert#:                             Employer: Not employed Benefits:  Phone #: [phone]     Name: Automated Eff. Date: Eligible 09/28/13     Deduct:        Out of Pocket Max:        Life Max:   CIR:        SNF:   Outpatient:       Co-Pay:   Home Health:        Co-Pay:   DME:       Co-Pay:   Providers:     Emergency Contact Information Contact Information   Name Relation Home Work Mobile   [place] Sister [phone]       Current Medical History  Patient Admitting Diagnosis:  R BKA  History of Present Illness:  A 58 y.o. right-handed male with history of hypertension, schizophrenia, diabetes mellitus with peripheral neuropathy and right lower extremity cellulitis. Patient lives alone independent prior to admission and sedentary. Presented 09/19/2013 with right foot also and soft tissue infection. MRI of right foot showed abnormal diffuse subcutaneous and muscular edema consistent with cellulitis. Patient underwent extensive debridement 09/24/2013 per Dr. Percell Miller and placed on antibiotic therapy. 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 Marcelino Scot as well as repeat debridement of wound. Hospital course pain management. Subcutaneous heparin for DVT prophylaxis. Acute blood loss anemia 9.3 and monitored. Physical therapy evaluation completed 09/27/2013 with recommendations for physical medicine rehabilitation consult.    Past Medical History  Past Medical History  Diagnosis Date  . Diabetes mellitus   .  Hypertension     Family History  family history is not on file.  Prior Rehab/Hospitalizations:  None   Current Medications  Current facility-administered medications:acetaminophen (TYLENOL) suppository 325-650 mg, 325-650 mg, Rectal, Q4H PRN, Lind Covert, MD;  acetaminophen (TYLENOL) tablet 325-650 mg, 325-650 mg, Oral, Q4H PRN, Lind Covert, MD;  alum & mag hydroxide-simeth (MAALOX/MYLANTA) 200-200-20 MG/5ML suspension 15-30 mL, 15-30 mL, Oral, Q2H PRN, Jari Pigg, PA-C brimonidine (ALPHAGAN) 0.2 % ophthalmic solution 1 drop, 1 drop, Left Eye, BID, Leeanne Rio, MD, 1 drop at 09/28/13 1017;  dorzolamide-timolol (COSOPT) 22.3-6.8 MG/ML ophthalmic solution 1 drop, 1 drop, Left Eye, BID, Leeanne Rio, MD, 1 drop at 09/28/13 1017;  gabapentin (NEURONTIN) capsule 300 mg, 300 mg, Oral, TID, Jari Pigg, PA-C, 300 mg at 09/28/13 1017 guaiFENesin-dextromethorphan (ROBITUSSIN DM) 100-10 MG/5ML syrup 15 mL, 15 mL, Oral, Q4H PRN, Jari Pigg, PA-C;  heparin injection 5,000 Units, 5,000 Units, Subcutaneous, 3 times per day, Leeanne Rio, MD, 5,000 Units at 09/28/13 [phone];  hydrALAZINE (APRESOLINE) injection 10 mg,  10 mg, Intravenous, Q2H PRN, Jari Pigg, PA-C insulin aspart (novoLOG) injection 0-15 Units, 0-15 Units, Subcutaneous, TID WC, Leeanne Rio, MD, 3 Units at 09/27/13 1208;  insulin glargine (LANTUS) injection 20 Units, 20 Units, Subcutaneous, QHS, Lavon Paganini, MD;  lactated ringers infusion, , Intravenous, Continuous, Jari Pigg, PA-C, Last Rate: 100 mL/hr at 09/27/13 2306;  [START ON 09/29/2013] lisinopril (PRINIVIL,ZESTRIL) tablet 40 mg, 40 mg, Oral, Daily, Lavon Paganini, MD metoprolol (LOPRESSOR) injection 2-5 mg, 2-5 mg, Intravenous, Q2H PRN, Jari Pigg, PA-C;  ondansetron [facility]) injection 4 mg, 4 mg, Intravenous, Q6H PRN, Leeanne Rio, MD;  ondansetron [facility]) injection 4 mg, 4 mg, Intravenous, Q6H PRN, Jari Pigg, PA-C;   ondansetron [facility]) tablet 4 mg, 4 mg, Oral, Q6H PRN, Leeanne Rio, MD oxyCODONE (Oxy IR/ROXICODONE) immediate release tablet 5-20 mg, 5-20 mg, Oral, Q4H PRN, Jari Pigg, PA-C, 20 mg at 09/28/13 1139;  pantoprazole (PROTONIX) EC tablet 40 mg, 40 mg, Oral, Daily, Jari Pigg, PA-C, 40 mg at 09/28/13 1020;  phenol (CHLORASEPTIC) mouth spray 1 spray, 1 spray, Mouth/Throat, PRN, Jari Pigg, PA-C;  potassium chloride SA (K-DUR,KLOR-CON) CR tablet 20 mEq, 20 mEq, Oral, BID, Lavon Paganini, MD, 20 mEq at 09/28/13 1017  Patients Current Diet: Heart Healthy/Carb modified, thin liquids  Precautions / Restrictions Precautions Precautions: Fall Precaution Comments: educated on precautions Restrictions Weight Bearing Restrictions: Yes RLE Weight Bearing: Non weight bearing Other Position/Activity Restrictions: elevate operated leg, do not prop knee with pillows,    Prior Activity Level Community (5-7x/wk): Went out daily 1 pm to 5 pm to the store, for walks, to run errands.  Took the city bus as needed.  Home Assistive Devices / Equipment Home Assistive Devices/Equipment: CBG Meter Home Equipment: None  Prior Functional Level Prior Function Level of Independence: Independent  Current Functional Level Cognition  Overall Cognitive Status: History of cognitive impairments - at baseline (slow processing) Orientation Level: Oriented to person;Oriented to place;Disoriented to time;Disoriented to situation    Extremity Assessment (includes Sensation/Coordination)  Upper Extremity Assessment: Overall WFL for tasks assessed  Lower Extremity Assessment: RLE deficits/detail  RLE Deficits / Details: bulky ACE wrap limiting knee flexion but extension appears intact. otherwise strong     ADLs  Overall ADL's : Needs assistance/impaired Grooming: Set up;Sitting Lower Body Bathing: Minimal assistance;Sit to/from stand Lower Body Dressing: Min guard;Sit to/from stand Toilet Transfer:  Minimal assistance;Stand-pivot;RW;BSC Functional mobility during ADLs: Minimal assistance;Rolling walker General ADL Comments: Educated on alternative technique for LB ADLs (leaning or in bed). Educated on desensitization techniques for RLE. Elevated RLE at end of session. Pt washed off LB and peri area during session and donned/doffed pants. Educated on chair push ups as pt is going to have to be using UB a lot for transfers and mobility.    Mobility  Overal bed mobility: Modified Independent General bed mobility comments: bridges, moves to/from EOB, scoots without physical assist    Transfers  Overall transfer level: Needs assistance Equipment used: Rolling walker (2 wheeled) Transfers: Sit to/from [facility] Sit to Stand: Min guard Stand pivot transfers: Min assist General transfer comment: Pt performed stand pivot to [facility] and then hopped to chair. Cues for technique.    Ambulation / Gait / Stairs / Wheelchair Mobility  Ambulation/Gait General Gait Details: not assessed or described    Posture / Balance      Special needs/care consideration BiPAP/CPAP No CPM No Continuous Drip IV No Dialysis No    Life  Vest No Oxygen No Special Bed No Trach Size No Wound Vac (area) No    Skin New R BKA incision with dressing and ace wrap intact                               Bowel mgmt: Last BM 09/27/13 Bladder mgmt: Voiding in urinal Diabetic mgmt Yes, on insulin at home    Previous Home Environment Living Arrangements: Alone Available Help at Discharge: Available PRN/intermittently;Family Type of Home: Apartment Home Layout: One level Home Access: Level entry (lives on first floor) Bathroom Shower/Tub: Chiropodist: Standard Home Care Services: No Additional Comments: pt cannot go live with sister per his report  Discharge Living Setting Plans for Discharge Living Setting: Alone;Apartment Type of Home at Discharge: Apartment (1st floor  apartment.) Discharge Home Layout: One level Discharge Home Access: Level entry Does the patient have any problems obtaining your medications?: No  Social/Family/Support Systems Patient Roles: Other (Comment) (Has a sister, Alice.) Contact Information: Delbert [place]ix - sister [phone] Anticipated Caregiver: self Ability/Limitations of Caregiver: Patient hopes to discharge to his apartment.  Did not want to go home with his sister Caregiver Availability: Intermittent Discharge Plan Discussed with Primary Caregiver: Yes Is Caregiver In Agreement with Plan?: Yes Does Caregiver/Family have Issues with Lodging/Transportation while Pt is in Rehab?: No  Goals/Additional Needs Patient/Family Goal for Rehab: PT/OT supervision goals Expected length of stay: 7-10 days Cultural Considerations: None,  Does have a history of schizophrenia. Dietary Needs: Heart healthy, carb mod., thin liquids Equipment Needs: TBD Pt/Family Agrees to Admission and willing to participate: Yes Program Orientation Provided & Reviewed with Pt/Caregiver Including Roles  & Responsibilities: Yes  Decrease burden of Care through IP rehab admission: N/A  Possible need for SNF placement upon discharge: Not anticipated  Patient Condition: This patient's condition remains as documented in the consult dated 09/28/13, in which the Rehabilitation Physician determined and documented that the patient's condition is appropriate for intensive rehabilitative care in an inpatient rehabilitation facility. Will admit to inpatient rehab today.  Preadmission Screen Completed By:  Retta Diones, 09/28/2013 12:15 PM ______________________________________________________________________   Discussed status with Dr. Naaman Plummer on 09/28/13 at 1214 and received telephone approval for admission today.  Admission Coordinator:  Retta Diones, time1214/Date07/27/15

## 2013-09-28 NOTE — Progress Notes (Signed)
[facility] Progress Note  Subjective   awake and alert Being evaluated by inpatient rehab MD    Objective   BP 147/91  Pulse 73  Temp(Src) 99.2 F (37.3 C) (Oral)  Resp 14  Ht 6' (1.829 m)  Wt 98.385 kg (216 lb 14.4 oz)  BMI 29.41 kg/m2  SpO2 96%  Intake/Output     07/26 0701 - 07/27 0700 07/27 0701 - 07/28 0700   P.O. 580    I.V. (mL/kg) 2095 (21.3)    Total Intake(mL/kg) 2675 (27.2)    Urine (mL/kg/hr) 2175 (0.9)    Total Output 2175     Net +500          Urine Occurrence 1 x    Stool Occurrence 1 x      Labs  Results for Jake, RICCIUTI (MRN [id number]) as of 09/28/2013 09:29  Ref. Range 09/28/2013 06:46  Sodium Latest Range: 137-147 mEq/L 140  Potassium Latest Range: 3.7-5.3 mEq/L 4.1  Chloride Latest Range: 96-112 mEq/L 101  CO2 Latest Range: 19-32 mEq/L 28  BUN Latest Range: 6-23 mg/dL 7  Creatinine Latest Range: 0.50-1.35 mg/dL 0.95  Calcium Latest Range: 8.4-10.5 mg/dL 8.2 (L)  GFR calc non Af Amer Latest Range: >90 mL/min >90  GFR calc Af Amer Latest Range: >90 mL/min >90  Glucose Latest Range: 70-99 mg/dL 70  Anion gap Latest Range: 5-15  11  WBC Latest Range: 4.0-10.5 K/uL 13.5 (H)  RBC Latest Range: 4.22-5.81 MIL/uL 3.30 (L)  Hemoglobin Latest Range: 13.0-17.0 g/dL 9.0 (L)  HCT Latest Range: 39.0-52.0 % 27.9 (L)  MCV Latest Range: 78.0-100.0 fL 84.5  MCH Latest Range: 26.0-34.0 pg 27.3  MCHC Latest Range: 30.0-36.0 g/dL 32.3  RDW Latest Range: 11.5-15.5 % 12.7  Platelets Latest Range: 150-400 K/uL 508 (H)   CBG (last 3)   Recent Labs  09/27/13 1656 09/27/13 2222 09/28/13 0817  GLUCAP 101* 145* 61*     Exam  Gen: awake, eating breakfast  Ext:      Right Lower Extremity   Dressing stable  Will change dressing later this afternoon     Assessment and Plan   POD/HD#: 2   58 y/o male with R foot gangrene s/p R BKA  1. 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 Pain management  Continue with current regimen including neuropathic meds  3. DVT/PE prophylaxis  rec dc with lovenox x 14 days post op   4. ID   Continue with current abx regimen  Will likely continue for another 48 hours   5. Dispo  Therapies  CIR when bed available if accepted    Jari Pigg, PA-C [facility] [phone] (P) 09/28/2013 9:29 AM  **Disclaimer: This note may have been dictated with voice recognition software. Similar sounding words can inadvertently be transcribed and this note may contain transcription errors which may not have been corrected upon publication of note.**

## 2013-09-28 NOTE — Progress Notes (Signed)
Physical Therapy Treatment Patient Details Name: [name] MRN: [id number] DOB: 1955-06-24 Today's Date: 09/28/2013    History of Present Illness 58 yo male with hx schizophrenia, HTN, DM2 presented 7/19 with RLE cellulitis.  Failed management with abx, underwent I&D and ultimately BKA    PT Comments    Pt making good progress with mobility and able to ambulate up to approx 40' with RW.  Did note increase in pain during gait, therefore RN notified.  Educated on desensitization techniques and how to progress, as well as therex for RLE, however feel he will need constant reminder due to premorbid cognitive deficits.  [name] from [facility] in room to provide shrinker.  Also educated on purpose and need to change, but hopefully will continue to educate on CIR.    Follow Up Recommendations  CIR     Equipment Recommendations  Rolling walker with 5" wheels;Wheelchair (measurements PT)    Recommendations for Other Services Rehab consult     Precautions / Restrictions Precautions Precautions: Fall Precaution Comments: educated on precautions, s/p BKA Restrictions Weight Bearing Restrictions: Yes RLE Weight Bearing: Non weight bearing Other Position/Activity Restrictions: elevate operated leg, do not prop knee with pillows,     Mobility  Bed Mobility               General bed mobility comments: Pt up in recliner when PT arrived.   Transfers Overall transfer level: Needs assistance Equipment used: Rolling walker (2 wheeled) Transfers: Sit to/from Stand Sit to Stand: Min guard         General transfer comment: Performed standing w/ min/guard assist for steadying with mod cues for hand placement and increased forward weight shift.   Ambulation/Gait Ambulation/Gait assistance: Min assist Ambulation Distance (Feet): 40 Feet Assistive device: Rolling walker (2 wheeled) Gait Pattern/deviations: Step-to pattern;Trunk flexed     General Gait Details:  Note pt did very well with hopping w/ RW initially, note that as he fatigues, step length got shorter and had increased difficulty fully elevating RLE.     Stairs            Wheelchair Mobility    Modified Rankin (Stroke Patients Only)       Balance Overall balance assessment: Needs assistance   Sitting balance-Leahy Scale: Good     Standing balance support: During functional activity Standing balance-Leahy Scale: Fair                      Cognition Arousal/Alertness: Awake/alert Behavior During Therapy: WFL for tasks assessed/performed Overall Cognitive Status: History of cognitive impairments - at baseline                      Exercises Other Exercises Other Exercises: Seated SLR x 10 reps R LE Other Exercises: Seated hip abd/add x 10 reps RLE Other Exercises: Discussed performing SL hip abd in bed, however feel that he will need continuous reminders/handout due to premorbid cognitive deficits.     General Comments General comments (skin integrity, edema, etc.): Discussed desensitization techniques, how to progress and also went over a few exercises.  See details below.       Pertinent Vitals/Pain 6/10, RN notified and pain meds given at end of session.      Home Living                      Prior Function  PT Goals (current goals can now be found in the care plan section) Acute Rehab PT Goals Patient Stated Goal: not stated PT Goal Formulation: With patient Time For Goal Achievement: 10/11/13 Potential to Achieve Goals: Good Progress towards PT goals: Progressing toward goals    Frequency  Min 4X/week    PT Plan Current plan remains appropriate    Co-evaluation             End of Session   Activity Tolerance: Patient tolerated treatment well;Patient limited by pain Patient left: in chair;with call bell/phone within reach;with nursing/sitter in room     Time: 5364-6803 PT Time Calculation (min): 23  min  Charges:  $Gait Training: 8-22 mins $Self Care/Home Management: 8-22                    G Codes:      Denice Bors 09/28/2013, 12:15 PM

## 2013-09-28 NOTE — Progress Notes (Signed)
ANTIBIOTIC CONSULT NOTE - FOLLOW UP  Pharmacy Consult for Vancomycin and Primaxin Indication: Diabetic foot ulcer with cellulitis  No Known Allergies  Patient Measurements: Height: 6' (182.9 cm) Weight: 216 lb 14.4 oz (98.385 kg) IBW/kg (Calculated) : 77.6 Adjusted Body Weight:   Vital Signs: Temp: 99.2 F (37.3 C) (07/27 0601) Temp src: Oral (07/27 0601) BP: 147/91 mmHg (07/27 0601) Pulse Rate: 73 (07/27 0601) Intake/Output from previous day: 07/26 0701 - 07/27 0700 In: 2675 [P.O.:580; I.V.:2095] Out: 2175 [Urine:2175] Intake/Output from this shift:    Labs:  Recent Labs  09/26/13 0342 09/27/13 0519 09/27/13 1812 09/28/13 0646  WBC 18.1* 14.5* 17.5* 13.5*  HGB 10.1* 9.8* 9.3* 9.0*  PLT 506* 516* 587* 508*  CREATININE 0.84 0.96  --  0.95   Estimated Creatinine Clearance: 105.5 ml/min (by C-G formula based on Cr of 0.95). No results found for this basename: VANCOTROUGH, Corlis Leak, VANCORANDOM, GENTTROUGH, GENTPEAK, GENTRANDOM, TOBRATROUGH, TOBRAPEAK, TOBRARND, AMIKACINPEAK, AMIKACINTROU, AMIKACIN,  in the last 72 hours   Microbiology: Recent Results (from the past 720 hour(s))  CULTURE, BLOOD (ROUTINE X 2)     Status: None   Collection Time    09/22/13 12:44 PM      Result Value Ref Range Status   Specimen Description BLOOD RIGHT HAND   Final   Special Requests BOTTLES DRAWN AEROBIC AND ANAEROBIC 10CC   Final   Culture  Setup Time     Final   Value: 09/22/2013 17:12     Performed at [facility]   Culture     Final   Value: NO GROWTH 5 DAYS     Performed at [facility]   Report Status 09/28/2013 FINAL   Final  CULTURE, BLOOD (ROUTINE X 2)     Status: None   Collection Time    09/22/13 12:47 PM      Result Value Ref Range Status   Specimen Description BLOOD LEFT ANTECUBITAL   Final   Special Requests BOTTLES DRAWN AEROBIC AND ANAEROBIC 10CC   Final   Culture  Setup Time     Final   Value: 09/22/2013 17:11     Performed at [facility]   Culture     Final   Value: NO GROWTH 5 DAYS     Performed at [facility]   Report Status 09/28/2013 FINAL   Final  SURGICAL PCR SCREEN     Status: None   Collection Time    09/24/13  1:08 PM      Result Value Ref Range Status   MRSA, PCR NEGATIVE  NEGATIVE Final   Staphylococcus aureus NEGATIVE  NEGATIVE Final   Comment:            The Xpert SA Assay (FDA     approved for NASAL specimens     in patients over 40 years of age),     is one component of     a comprehensive surveillance     program.  Test performance has     been validated by [employer] for patients greater     than or equal to 66 year old.     It is not intended     to diagnose infection nor to     guide or monitor treatment.  ANAEROBIC CULTURE     Status: None   Collection Time    09/24/13  3:41 PM      Result Value Ref Range Status  Specimen Description ABSCESS FOOT RIGHT   Final   Special Requests PT ON VANCO PRIMAXIN   Final   Gram Stain     Final   Value: NO WBC SEEN     NO SQUAMOUS EPITHELIAL CELLS SEEN     ABUNDANT GRAM POSITIVE COCCI     IN PAIRS ABUNDANT GRAM VARIABLE ROD     Performed at [facility]   Culture     Final   Value: NO ANAEROBES ISOLATED; CULTURE IN PROGRESS FOR 5 DAYS     Performed at [facility]   Report Status PENDING   Incomplete  CULTURE, ROUTINE-ABSCESS     Status: None   Collection Time    09/24/13  3:41 PM      Result Value Ref Range Status   Specimen Description ABSCESS FOOT RIGHT   Final   Special Requests PT ON VANCO PRIMAXIN   Final   Gram Stain     Final   Value: NO WBC SEEN     NO SQUAMOUS EPITHELIAL CELLS SEEN     ABUNDANT GRAM POSITIVE COCCI     IN PAIRS ABUNDANT GRAM VARIABLE ROD     Performed at [facility]   Culture     Final   Value: MULTIPLE ORGANISMS PRESENT, NONE PREDOMINANT NO STAPHYLOCOCCUS AUREUS ISOLATED NO GROUP A STREP (S.PYOGENES) ISOLATED     Performed at [facility]   Report Status  09/27/2013 FINAL   Final  ANAEROBIC CULTURE     Status: None   Collection Time    09/24/13  3:48 PM      Result Value Ref Range Status   Specimen Description TISSUE FOOT RIGHT   Final   Special Requests PT ON VANCO PRIMAXIN   Final   Gram Stain     Final   Value: MODERATE WBC PRESENT,BOTH PMN AND MONONUCLEAR     MODERATE GRAM POSITIVE COCCI     IN PAIRS FEW GRAM VARIABLE ROD     Performed at [facility]   Culture     Final   Value: NO ANAEROBES ISOLATED; CULTURE IN PROGRESS FOR 5 DAYS     Performed at [facility]   Report Status PENDING   Incomplete  TISSUE CULTURE     Status: None   Collection Time    09/24/13  3:48 PM      Result Value Ref Range Status   Specimen Description TISSUE RIGHT FOOT   Final   Special Requests PT ON VANC PRIMAXIN   Final   Gram Stain     Final   Value: MODERATE WBC PRESENT,BOTH PMN AND MONONUCLEAR     ABUNDANT GRAM POSITIVE COCCI     IN PAIRS FEW GRAM VARIABLE ROD     Performed at [facility]     Final   Value: MULTIPLE ORGANISMS PRESENT, NONE PREDOMINANT NO STAPHYLOCOCCUS AUREUS ISOLATED NO GROUP A STREP (S.PYOGENES) ISOLATED     Performed at [facility]   Report Status 09/27/2013 FINAL   Final  CLOSTRIDIUM DIFFICILE BY PCR     Status: None   Collection Time    09/25/13  8:30 PM      Result Value Ref Range Status   C difficile by pcr NEGATIVE  NEGATIVE Final    Anti-infectives   Start     Dose/Rate Route Frequency Ordered Stop   09/27/13 1800  vancomycin (VANCOCIN) IVPB 1000 mg/200 mL premix  1,000 mg 200 mL/hr over 60 Minutes Intravenous Every 8 hours 09/27/13 1711     09/27/13 1800  imipenem-cilastatin (PRIMAXIN) 500 mg in sodium chloride 0.9 % 100 mL IVPB     500 mg 200 mL/hr over 30 Minutes Intravenous 4 times per day 09/27/13 1711     09/22/13 1200  imipenem-cilastatin (PRIMAXIN) 500 mg in sodium chloride 0.9 % 100 mL IVPB  Status:  Discontinued     500 mg 200 mL/hr over 30 Minutes  Intravenous 4 times per day 09/22/13 1104 09/26/13 1608   09/20/13 0800  vancomycin (VANCOCIN) IVPB 1000 mg/200 mL premix  Status:  Discontinued     1,000 mg 200 mL/hr over 60 Minutes Intravenous Every 8 hours 09/20/13 0037 09/26/13 1608   09/20/13 0600  piperacillin-tazobactam (ZOSYN) IVPB 3.375 g  Status:  Discontinued     3.375 g 12.5 mL/hr over 240 Minutes Intravenous Every 8 hours 09/20/13 0037 09/22/13 1104   09/20/13 0045  vancomycin (VANCOCIN) IVPB 1000 mg/200 mL premix     1,000 mg 200 mL/hr over 60 Minutes Intravenous  Once 09/20/13 0037 09/20/13 0244   09/20/13 0045  piperacillin-tazobactam (ZOSYN) IVPB 3.375 g     3.375 g 100 mL/hr over 30 Minutes Intravenous  Once 09/20/13 0037 09/20/13 0215   09/19/13 2130  cefTRIAXone (ROCEPHIN) injection 1 g  Status:  Discontinued     1 g Intramuscular  Once 09/19/13 2123 09/19/13 2123   09/19/13 2130  cefTRIAXone (ROCEPHIN) 1 g in dextrose 5 % 50 mL IVPB     1 g 100 mL/hr over 30 Minutes Intravenous  Once 09/19/13 2123 09/19/13 2346      Assessment: 58yo male with foot ulcer, cellulitis, and continued temps.  He had R BKA on 7/25.  Antibiotics will continue for a couple of days depending on how wound looks.  Goal of Therapy:  Vancomycin trough level 10-15 mcg/ml  Plan:  1-  Continue Vancomycin 1000mg  IV q8 2-  Primaxin 500mg  IV q6 per MD   Excell Seltzer, [facility]

## 2013-09-28 NOTE — Consult Note (Signed)
Physical Medicine and Rehabilitation Consult Reason for Consult: Right BKA Referring Physician: Family medicine   HPI: [name] is a 58 y.o. right-handed male with history of hypertension, schizophrenia, diabetes mellitus with peripheral neuropathy and right lower extremity cellulitis. Patient lives alone independent prior to admission and sedentary. Presented 09/19/2013 with right foot also and soft tissue infection. MRI of right foot showed abnormal diffuse subcutaneous and muscular edema consistent with cellulitis. Patient underwent extensive debridement 09/24/2013 per Dr. Percell Miller and placed on antibiotic therapy. 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 Marcelino Scot as well as repeat debridement of wound. Hospital course pain management. Subcutaneous heparin for DVT prophylaxis. Acute blood loss anemia 9.3 and monitored. Physical therapy evaluation completed 09/27/2013 with recommendations for physical medicine rehabilitation consult.  Phantom limb sensation and Stump pain Appetite good, moving bowels   Review of Systems  Gastrointestinal: Positive for constipation.  Musculoskeletal: Positive for joint pain and myalgias.  Neurological: Positive for weakness.  Psychiatric/Behavioral:       Schizophrenia  All other systems reviewed and are negative.  Past Medical History  Diagnosis Date  . Diabetes mellitus   . Hypertension    Past Surgical History  Procedure Laterality Date  . I&d extremity Right 09/24/2013    Procedure: IRRIGATION AND DEBRIDEMENT RIGHT FOOT ULCER;  Surgeon: Renette Butters, MD;  Location: [facility];  Service: Orthopedics;  Laterality: Right;   History reviewed. No pertinent family history. Social History:  reports that he has never smoked. He does not have any smokeless tobacco history on file. He reports that he does not drink alcohol or use illicit drugs. Allergies: No Known Allergies Medications  Prior to Admission  Medication Sig Dispense Refill  . ACCU-CHEK AVIVA PLUS test strip USE AS INSTRUCTED  100 each  4  . amLODipine (NORVASC) 10 MG tablet Take 1 tablet (10 mg total) by mouth daily.  90 tablet  3  . brimonidine (ALPHAGAN) 0.15 % ophthalmic solution Place 1 drop into the left eye 2 (two) times daily.      . dorzolamide-timolol (COSOPT) 22.3-6.8 MG/ML ophthalmic solution Place 1 drop into the left eye 2 (two) times daily.      Marland Kitchen ibuprofen (ADVIL,MOTRIN) 200 MG tablet Take 200 mg by mouth every 6 (six) hours as needed.      . INS SYRINGE/NEEDLE 1CC/29G ([employer] INSULIN SYRINGE 1CC/29G) 29G X 1/2" 1 ML MISC by Does not apply route.        . insulin aspart (NOVOLOG) 100 UNIT/ML injection Inject 5 Units into the skin 3 (three) times daily before meals. Do not take if sugar is <150  3 vial  PRN  . insulin glargine (LANTUS) 100 UNIT/ML injection Inject 0.5 mLs (50 Units total) into the skin at bedtime.  20 mL  3  . Lancet Devices (SOFT TOUCH LANCET DEVICE) MISC by Does not apply route.        . Lancets (ACCU-CHEK SOFT TOUCH) lancets 1 each by Other route as needed. Use as instructed       . lisinopril (PRINIVIL,ZESTRIL) 10 MG tablet Take 1 tablet (10 mg total) by mouth daily.  90 tablet  3  . Tetrahydrozoline HCl (VISINE OP) Apply 1 drop to eye 2 (two) times daily. Only uses in left eye.        Home: Home Living Family/patient expects to be discharged to:: Private residence Living Arrangements: Alone Available Help at Discharge: Available PRN/intermittently;Family Type of Home:  Apartment Home Access: Level entry (lives on first floor) Home Layout: One level Home Equipment: None Additional Comments: pt cannot go live with sister per his report  Functional History: Prior Function Level of Independence: Independent Functional Status:  Mobility: Bed Mobility Overal bed mobility: Modified Independent General bed mobility comments: bridges, moves to/from EOB, scoots without physical  assist Transfers Overall transfer level: Needs assistance Equipment used: Rolling walker (2 wheeled) Transfers: Sit to/from [facility] Sit to Stand: Min guard Stand pivot transfers: Min assist General transfer comment: Pt performed stand pivot to [facility] and then hopped to chair. Cues for technique. Ambulation/Gait General Gait Details: not assessed or described    ADL: ADL Overall ADL's : Needs assistance/impaired Grooming: Set up;Sitting Lower Body Bathing: Minimal assistance;Sit to/from stand Lower Body Dressing: Min guard;Sit to/from stand Toilet Transfer: Minimal assistance;Stand-pivot;RW;BSC Functional mobility during ADLs: Minimal assistance;Rolling walker General ADL Comments: Educated on alternative technique for LB ADLs (leaning or in bed). Educated on desensitization techniques for RLE. Elevated RLE at end of session. Pt washed off LB and peri area during session and donned/doffed pants. Educated on chair push ups as pt is going to have to be using UB a lot for transfers and mobility.  Cognition: Cognition Overall Cognitive Status: History of cognitive impairments - at baseline (slow processing) Orientation Level: Oriented to person;Oriented to place;Disoriented to time;Disoriented to situation Cognition Arousal/Alertness: Awake/alert Behavior During Therapy: WFL for tasks assessed/performed Overall Cognitive Status: History of cognitive impairments - at baseline (slow processing)  Blood pressure 147/91, pulse 73, temperature 99.2 F (37.3 C), temperature source Oral, resp. rate 14, height 6' (1.829 m), weight 98.385 kg (216 lb 14.4 oz), SpO2 96.00%. Physical Exam  Constitutional: He is oriented to person, place, and time. He appears well-developed.  HENT:  Head: Normocephalic.  Eyes: EOM are normal.  Neck: Normal range of motion. Neck supple. No thyromegaly present.  Cardiovascular: Normal rate and regular rhythm.   Respiratory: Effort normal and  breath sounds normal. No respiratory distress.  GI: Soft. Bowel sounds are normal. He exhibits no distension.  Neurological: He is alert and oriented to person, place, and time.  Mood is flat but appropriate. He does follow commands  Skin:  Right BKA site is dressed appropriately tender    Results for orders placed during the hospital encounter of 09/19/13 (from the past 24 hour(s))  GLUCOSE, CAPILLARY     Status: Abnormal   Collection Time    09/27/13  7:50 AM      Result Value Ref Range   Glucose-Capillary 67 (*) 70 - 99 mg/dL  GLUCOSE, CAPILLARY     Status: Abnormal   Collection Time    09/27/13  8:50 AM      Result Value Ref Range   Glucose-Capillary 144 (*) 70 - 99 mg/dL  GLUCOSE, CAPILLARY     Status: Abnormal   Collection Time    09/27/13 11:52 AM      Result Value Ref Range   Glucose-Capillary 178 (*) 70 - 99 mg/dL  GLUCOSE, CAPILLARY     Status: Abnormal   Collection Time    09/27/13  4:56 PM      Result Value Ref Range   Glucose-Capillary 101 (*) 70 - 99 mg/dL  CBC     Status: Abnormal   Collection Time    09/27/13  6:12 PM      Result Value Ref Range   WBC 17.5 (*) 4.0 - 10.5 K/uL   RBC 3.54 (*) 4.22 -  5.81 MIL/uL   Hemoglobin 9.3 (*) 13.0 - 17.0 g/dL   HCT 29.5 (*) 39.0 - 52.0 %   MCV 83.3  78.0 - 100.0 fL   MCH 26.3  26.0 - 34.0 pg   MCHC 31.5  30.0 - 36.0 g/dL   RDW 12.7  11.5 - 15.5 %   Platelets 587 (*) 150 - 400 K/uL  GLUCOSE, CAPILLARY     Status: Abnormal   Collection Time    09/27/13 10:22 PM      Result Value Ref Range   Glucose-Capillary 145 (*) 70 - 99 mg/dL   Dg Tibia/fibula Right Port  09/27/2013   CLINICAL DATA:  58 year old male -right BKA.  EXAM: PORTABLE RIGHT TIBIA AND FIBULA - 2 VIEW  COMPARISON:  None.  FINDINGS: BKA changes noted.  No complicating features are identified.  Mild degenerative changes in the knee are present.  IMPRESSION: BKA without complicating features.   Electronically Signed   By: Hassan Rowan M.D.   On: 09/27/2013 16:51     Assessment/Plan: Diagnosis: Right BKA 1. Does the need for close, 24 hr/day medical supervision in concert with the patient's rehab needs make it unreasonable for this patient to be served in a less intensive setting? Yes 2. Co-Morbidities requiring supervision/potential complications: DM, HTN 3. Due to bladder management, bowel management, safety, skin/wound care, disease management, medication administration, pain management and patient education, does the patient require 24 hr/day rehab nursing? Yes 4. Does the patient require coordinated care of a physician, rehab nurse, PT (1-2 hrs/day, 5 days/week) and OT (1-2 hrs/day, 5 days/week) to address physical and functional deficits in the context of the above medical diagnosis(es)? Yes Addressing deficits in the following areas: balance, endurance, locomotion, strength, transferring, bowel/bladder control, bathing, dressing, feeding, grooming and toileting 5. Can the patient actively participate in an intensive therapy program of at least 3 hrs of therapy per day at least 5 days per week? Yes 6. The potential for patient to make measurable gains while on inpatient rehab is good 7. Anticipated functional outcomes upon discharge from inpatient rehab are modified independent  with PT, modified independent with OT, modified independent with SLP. 8. Estimated rehab length of stay to reach the above functional goals is: 7-10d 9.  10. Does the patient have adequate social supports to accommodate these discharge functional goals? Yes 11. Anticipated D/C setting: Home 12. Anticipated post D/C treatments: [place] therapy 13. Overall Rehab/Functional Prognosis: excellent  RECOMMENDATIONS: This patient's condition is appropriate for continued rehabilitative care in the following setting: CIR Patient has agreed to participate in recommended program. Yes Note that insurance prior authorization may be required for reimbursement for recommended care.  Comment:       09/28/2013

## 2013-09-28 NOTE — Progress Notes (Signed)
Orthopedic Tech Progress Note Patient Details:  [name] 1955-09-21 [id number] [facility] called to place brace order Patient ID: [name], male   DOB: 1956/02/09, 58 y.o.   MRN: [id number]   [name] 09/28/2013, 9:24 AM

## 2013-09-28 NOTE — Progress Notes (Signed)
Rehab admissions - Evaluated for possible admission.  I met with patient and discussed inpatient rehab.  I called his sister as well.  Both are in agreement to inpatient rehab stay.  I spoke with MD and patient ready for rehab today.  Bed available and will admit to acute inpatient rehab today.  Call me for questions.  #[phone]

## 2013-09-28 NOTE — Progress Notes (Signed)
Physical Medicine and Rehabilitation Consult  Reason for Consult: Right BKA  Referring Physician: Family medicine  HPI: [name] is a 58 y.o. right-handed male with history of hypertension, schizophrenia, diabetes mellitus with peripheral neuropathy and right lower extremity cellulitis. Patient lives alone independent prior to admission and sedentary. Presented 09/19/2013 with right foot also and soft tissue infection. MRI of right foot showed abnormal diffuse subcutaneous and muscular edema consistent with cellulitis. Patient underwent extensive debridement 09/24/2013 per Dr. Percell Miller and placed on antibiotic therapy. 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 Marcelino Scot as well as repeat debridement of wound. Hospital course pain management. Subcutaneous heparin for DVT prophylaxis. Acute blood loss anemia 9.3 and monitored. Physical therapy evaluation completed 09/27/2013 with recommendations for physical medicine rehabilitation consult.  Phantom limb sensation and Stump pain  Appetite good, moving bowels  Review of Systems  Gastrointestinal: Positive for constipation.  Musculoskeletal: Positive for joint pain and myalgias.  Neurological: Positive for weakness.  Psychiatric/Behavioral:  Schizophrenia  All other systems reviewed and are negative.   Past Medical History   Diagnosis  Date   .  Diabetes mellitus    .  Hypertension     Past Surgical History   Procedure  Laterality  Date   .  I&d extremity  Right  09/24/2013     Procedure: IRRIGATION AND DEBRIDEMENT RIGHT FOOT ULCER; Surgeon: Renette Butters, MD; Location: [place]; Service: Orthopedics; Laterality: Right;    History reviewed. No pertinent family history.  Social History: reports that he has never smoked. He does not have any smokeless tobacco history on file. He reports that he does not drink alcohol or use illicit drugs.  Allergies: No Known Allergies  Medications Prior  to Admission   Medication  Sig  Dispense  Refill   .  ACCU-CHEK AVIVA PLUS test strip  USE AS INSTRUCTED  100 each  4   .  amLODipine (NORVASC) 10 MG tablet  Take 1 tablet (10 mg total) by mouth daily.  90 tablet  3   .  brimonidine (ALPHAGAN) 0.15 % ophthalmic solution  Place 1 drop into the left eye 2 (two) times daily.     .  dorzolamide-timolol (COSOPT) 22.3-6.8 MG/ML ophthalmic solution  Place 1 drop into the left eye 2 (two) times daily.     Marland Kitchen  ibuprofen (ADVIL,MOTRIN) 200 MG tablet  Take 200 mg by mouth every 6 (six) hours as needed.     .  INS SYRINGE/NEEDLE 1CC/29G ([facility] INSULIN SYRINGE 1CC/29G) 29G X 1/2" 1 ML MISC  by Does not apply route.     .  insulin aspart (NOVOLOG) 100 UNIT/ML injection  Inject 5 Units into the skin 3 (three) times daily before meals. Do not take if sugar is <150  3 vial  PRN   .  insulin glargine (LANTUS) 100 UNIT/ML injection  Inject 0.5 mLs (50 Units total) into the skin at bedtime.  20 mL  3   .  Lancet Devices (SOFT TOUCH LANCET DEVICE) MISC  by Does not apply route.     .  Lancets (ACCU-CHEK SOFT TOUCH) lancets  1 each by Other route as needed. Use as instructed     .  lisinopril (PRINIVIL,ZESTRIL) 10 MG tablet  Take 1 tablet (10 mg total) by mouth daily.  90 tablet  3   .  Tetrahydrozoline HCl (VISINE OP)  Apply 1 drop to eye 2 (two) times daily. Only uses in left eye.  Home:  Home Living  Family/patient expects to be discharged to:: Private residence  Living Arrangements: Alone  Available Help at Discharge: Available PRN/intermittently;Family  Type of Home: Apartment  Home Access: Level entry (lives on first floor)  Home Layout: One level  Home Equipment: None  Additional Comments: pt cannot go live with sister per his report  Functional History:  Prior Function  Level of Independence: Independent  Functional Status:  Mobility:  Bed Mobility  Overal bed mobility: Modified Independent  General bed mobility comments: bridges, moves to/from  EOB, scoots without physical assist  Transfers  Overall transfer level: Needs assistance  Equipment used: Rolling walker (2 wheeled)  Transfers: Sit to/from [facility]  Sit to Stand: Min guard  Stand pivot transfers: Min assist  General transfer comment: Pt performed stand pivot to [facility] and then hopped to chair. Cues for technique.  Ambulation/Gait  General Gait Details: not assessed or described   ADL:  ADL  Overall ADL's : Needs assistance/impaired  Grooming: Set up;Sitting  Lower Body Bathing: Minimal assistance;Sit to/from stand  Lower Body Dressing: Min guard;Sit to/from stand  Toilet Transfer: Minimal assistance;Stand-pivot;RW;BSC  Functional mobility during ADLs: Minimal assistance;Rolling walker  General ADL Comments: Educated on alternative technique for LB ADLs (leaning or in bed). Educated on desensitization techniques for RLE. Elevated RLE at end of session. Pt washed off LB and peri area during session and donned/doffed pants. Educated on chair push ups as pt is going to have to be using UB a lot for transfers and mobility.  Cognition:  Cognition  Overall Cognitive Status: History of cognitive impairments - at baseline (slow processing)  Orientation Level: Oriented to person;Oriented to place;Disoriented to time;Disoriented to situation  Cognition  Arousal/Alertness: Awake/alert  Behavior During Therapy: WFL for tasks assessed/performed  Overall Cognitive Status: History of cognitive impairments - at baseline (slow processing)  Blood pressure 147/91, pulse 73, temperature 99.2 F (37.3 C), temperature source Oral, resp. rate 14, height 6' (1.829 m), weight 98.385 kg (216 lb 14.4 oz), SpO2 96.00%.  Physical Exam  Constitutional: He is oriented to person, place, and time. He appears well-developed.  HENT:  Head: Normocephalic.  Eyes: EOM are normal.  Neck: Normal range of motion. Neck supple. No thyromegaly present.  Cardiovascular: Normal rate and  regular rhythm.  Respiratory: Effort normal and breath sounds normal. No respiratory distress.  GI: Soft. Bowel sounds are normal. He exhibits no distension.  Neurological: He is alert and oriented to person, place, and time.  Mood is flat but appropriate. He does follow commands  Skin:  Right BKA site is dressed appropriately tender   Results for orders placed during the hospital encounter of 09/19/13 (from the past 24 hour(s))   GLUCOSE, CAPILLARY Status: Abnormal    Collection Time    09/27/13 7:50 AM   Result  Value  Ref Range    Glucose-Capillary  67 (*)  70 - 99 mg/dL   GLUCOSE, CAPILLARY Status: Abnormal    Collection Time    09/27/13 8:50 AM   Result  Value  Ref Range    Glucose-Capillary  144 (*)  70 - 99 mg/dL   GLUCOSE, CAPILLARY Status: Abnormal    Collection Time    09/27/13 11:52 AM   Result  Value  Ref Range    Glucose-Capillary  178 (*)  70 - 99 mg/dL   GLUCOSE, CAPILLARY Status: Abnormal    Collection Time    09/27/13 4:56 PM   Result  Value  Ref  Range    Glucose-Capillary  101 (*)  70 - 99 mg/dL   CBC Status: Abnormal    Collection Time    09/27/13 6:12 PM   Result  Value  Ref Range    WBC  17.5 (*)  4.0 - 10.5 K/uL    RBC  3.54 (*)  4.22 - 5.81 MIL/uL    Hemoglobin  9.3 (*)  13.0 - 17.0 g/dL    HCT  29.5 (*)  39.0 - 52.0 %    MCV  83.3  78.0 - 100.0 fL    MCH  26.3  26.0 - 34.0 pg    MCHC  31.5  30.0 - 36.0 g/dL    RDW  12.7  11.5 - 15.5 %    Platelets  587 (*)  150 - 400 K/uL   GLUCOSE, CAPILLARY Status: Abnormal    Collection Time    09/27/13 10:22 PM   Result  Value  Ref Range    Glucose-Capillary  145 (*)  70 - 99 mg/dL    Dg Tibia/fibula Right Port  09/27/2013 CLINICAL DATA: 58 year old male -right BKA. EXAM: PORTABLE RIGHT TIBIA AND FIBULA - 2 VIEW COMPARISON: None. FINDINGS: BKA changes noted. No complicating features are identified. Mild degenerative changes in the knee are present. IMPRESSION: BKA without complicating features.  Electronically Signed By: Hassan Rowan M.D. On: 09/27/2013 16:51   Assessment/Plan:  Diagnosis: Right BKA  1. Does the need for close, 24 hr/day medical supervision in concert with the patient's rehab needs make it unreasonable for this patient to be served in a less intensive setting? Yes 2. Co-Morbidities requiring supervision/potential complications: DM, HTN 3. Due to bladder management, bowel management, safety, skin/wound care, disease management, medication administration, pain management and patient education, does the patient require 24 hr/day rehab nursing? Yes 4. Does the patient require coordinated care of a physician, rehab nurse, PT (1-2 hrs/day, 5 days/week) and OT (1-2 hrs/day, 5 days/week) to address physical and functional deficits in the context of the above medical diagnosis(es)? Yes Addressing deficits in the following areas: balance, endurance, locomotion, strength, transferring, bowel/bladder control, bathing, dressing, feeding, grooming and toileting 5. Can the patient actively participate in an intensive therapy program of at least 3 hrs of therapy per day at least 5 days per week? Yes 6. The potential for patient to make measurable gains while on inpatient rehab is good 7. Anticipated functional outcomes upon discharge from inpatient rehab are modified independent with PT, modified independent with OT, modified independent with SLP. 8. Estimated rehab length of stay to reach the above functional goals is: 7-10d 9. Does the patient have adequate social supports to accommodate these discharge functional goals? Yes 10. Anticipated D/C setting: Home 11. Anticipated post D/C treatments: [place] therapy 12. Overall Rehab/Functional Prognosis: excellent RECOMMENDATIONS:  This patient's condition is appropriate for continued rehabilitative care in the following setting: CIR  Patient has agreed to participate in recommended program. Yes  Note that insurance prior authorization may be  required for reimbursement for recommended care.  Comment:  09/28/2013  Revision History...      Date/Time User Action    09/28/2013 8:51 AM Charlett Blake, MD Sign    09/28/2013 6:31 AM Cathlyn Parsons, PA-C [place]   View Details Report    Routing History.Marland KitchenMarland Kitchen

## 2013-09-28 NOTE — Progress Notes (Signed)
INITIAL NUTRITION ASSESSMENT  DOCUMENTATION CODES Per approved criteria  -Not Applicable   INTERVENTION: - Glucerna Shake po TID, each supplement provides 220 kcal and 10 grams of protein - Multivitamin with minerals  NUTRITION DIAGNOSIS: Inadequate oral intake related to pain from BKA as evidenced by reported intake less than estimated needs.   Goal: Pt to meet >/= 90% of their estimated nutrition needs   Monitor:  Weight trends, po intake, acceptance of supplements, labs  Reason for Assessment: MST  58 y.o. male  Admitting Dx: <principal problem not specified>  ASSESSMENT: 58 y.o. male presenting with RLE Cellulitis. PMH is significant for T2DM, HTN, and Schizophrenia.  5/25:  PROCEDURE: Procedure(s):  1. AMPUTATION BELOW KNEE (Right)  2. Repeat IRRIGATION AND DEBRIDEMENT Right Foot Ulcer (Right)  7/27: - Per pt and RN, pt has been eating poorly due to pain from right amputation.  - Unable to tell if pt has lost weight from eating poorly or weight change was only due to amputation.  - Pt with no signs of significant fat and/or muscle wasting.  CBG's- 61-152 Na and K WNL  Height: Ht Readings from Last 1 Encounters:  09/20/13 6' (1.829 m)    Weight: Wt Readings from Last 1 Encounters:  09/20/13 216 lb 14.4 oz (98.385 kg)    Ideal Body Weight: 73 kg (adjusted for amputation)  % Ideal Body Weight: 135%  Wt Readings from Last 10 Encounters:  09/20/13 216 lb 14.4 oz (98.385 kg)  09/20/13 216 lb 14.4 oz (98.385 kg)  09/20/13 216 lb 14.4 oz (98.385 kg)  09/03/13 226 lb (102.513 kg)  08/05/13 232 lb (105.235 kg)  07/07/13 225 lb (102.059 kg)  06/08/13 220 lb (99.791 kg)  05/15/13 212 lb 4.8 oz (96.299 kg)  11/24/12 222 lb (100.699 kg)  04/25/12 236 lb (107.049 kg)    Usual Body Weight: 226 lbs prior to BKA  % Usual Body Weight: 96%  BMI:  27.7, adjusted for amputation. Pt is classified as overweight.  Estimated Nutritional Needs: Kcal:  2200-2400 Protein: 145-155 g Fluid: 2.2-2.4 L/day  Skin: wound from recent amputation on right stump  Diet Order:    EDUCATION NEEDS: -Education needs addressed   Intake/Output Summary (Last 24 hours) at 09/28/13 1223 Last data filed at 09/28/13 0900  Gross per 24 hour  Intake   2675 ml  Output   2275 ml  Net    400 ml    Last BM: 7/26   Labs:   Recent Labs Lab 09/24/13 0333 09/24/13 0402  09/26/13 0342 09/27/13 0519 09/28/13 0646  NA 137  --   < > 146 145 140  K 3.6*  --   < > 3.7 4.0 4.1  CL 97  --   < > 102 105 101  CO2 28  --   < > 31 28 28   BUN 11  --   < > 10 10 7   CREATININE 0.91  --   < > 0.84 0.96 0.95  CALCIUM 9.0  --   < > 9.1 8.5 8.2*  MG  --  1.9  --   --   --   --   GLUCOSE 143*  --   < > 48* 71 70  < > = values in this interval not displayed.  CBG (last 3)   Recent Labs  09/28/13 0930 09/28/13 1020 09/28/13 1216  GLUCAP 62* 84 152*    Scheduled Meds: . brimonidine  1 drop Left Eye BID  .  dorzolamide-timolol  1 drop Left Eye BID  . gabapentin  300 mg Oral TID  . heparin  5,000 Units Subcutaneous 3 times per day  . insulin aspart  0-15 Units Subcutaneous TID WC  . insulin glargine  20 Units Subcutaneous QHS  . [START ON 09/29/2013] lisinopril  40 mg Oral Daily  . pantoprazole  40 mg Oral Daily  . potassium chloride  20 mEq Oral BID    Continuous Infusions: . lactated ringers 100 mL/hr at 09/27/13 2306    Past Medical History  Diagnosis Date  . Diabetes mellitus   . Hypertension     Past Surgical History  Procedure Laterality Date  . I&d extremity Right 09/24/2013    Procedure: IRRIGATION AND DEBRIDEMENT RIGHT FOOT ULCER;  Surgeon: Renette Butters, MD;  Location: [place];  Service: Orthopedics;  Laterality: Right;    Terrace Arabia RD, LDN

## 2013-09-28 NOTE — Progress Notes (Signed)
Patient transferred to 4M07.  Report given to [facility]

## 2013-09-29 ENCOUNTER — Inpatient Hospital Stay (HOSPITAL_COMMUNITY): Payer: Medicaid Other | Admitting: Occupational Therapy

## 2013-09-29 ENCOUNTER — Encounter (HOSPITAL_COMMUNITY): Payer: Self-pay | Admitting: Orthopedic Surgery

## 2013-09-29 ENCOUNTER — Inpatient Hospital Stay (HOSPITAL_COMMUNITY): Payer: Medicaid Other | Admitting: *Deleted

## 2013-09-29 DIAGNOSIS — I10 Essential (primary) hypertension: Secondary | ICD-10-CM

## 2013-09-29 DIAGNOSIS — S88119A Complete traumatic amputation at level between knee and ankle, unspecified lower leg, initial encounter: Secondary | ICD-10-CM

## 2013-09-29 DIAGNOSIS — E1165 Type 2 diabetes mellitus with hyperglycemia: Secondary | ICD-10-CM

## 2013-09-29 DIAGNOSIS — IMO0001 Reserved for inherently not codable concepts without codable children: Secondary | ICD-10-CM

## 2013-09-29 LAB — COMPREHENSIVE METABOLIC PANEL
ALT: 65 U/L — ABNORMAL HIGH (ref 0–53)
AST: 85 U/L — ABNORMAL HIGH (ref 0–37)
Albumin: 2.1 g/dL — ABNORMAL LOW (ref 3.5–5.2)
Alkaline Phosphatase: 103 U/L (ref 39–117)
Anion gap: 11 (ref 5–15)
BUN: 10 mg/dL (ref 6–23)
CO2: 28 mEq/L (ref 19–32)
Calcium: 8.7 mg/dL (ref 8.4–10.5)
Chloride: 102 mEq/L (ref 96–112)
Creatinine, Ser: 1.06 mg/dL (ref 0.50–1.35)
GFR calc Af Amer: 89 mL/min — ABNORMAL LOW (ref >90)
GFR calc non Af Amer: 77 mL/min — ABNORMAL LOW (ref >90)
Glucose, Bld: 97 mg/dL (ref 70–99)
Potassium: 4.4 mEq/L (ref 3.7–5.3)
Sodium: 141 mEq/L (ref 137–147)
Total Bilirubin: 0.3 mg/dL (ref 0.3–1.2)
Total Protein: 7.2 g/dL (ref 6.0–8.3)

## 2013-09-29 LAB — ANAEROBIC CULTURE: Gram Stain: NONE SEEN

## 2013-09-29 LAB — GLUCOSE, CAPILLARY
Glucose-Capillary: 79 mg/dL (ref 70–99)
Glucose-Capillary: 88 mg/dL (ref 70–99)
Glucose-Capillary: 107 mg/dL — ABNORMAL HIGH (ref 70–99)
Glucose-Capillary: 137 mg/dL — ABNORMAL HIGH (ref 70–99)
Glucose-Capillary: 140 mg/dL — ABNORMAL HIGH (ref 70–99)

## 2013-09-29 LAB — CBC WITH DIFFERENTIAL/PLATELET
Basophils Absolute: 0 10*3/uL (ref 0.0–0.1)
Basophils Relative: 0 % (ref 0–1)
Eosinophils Absolute: 0.1 10*3/uL (ref 0.0–0.7)
Eosinophils Relative: 0 % (ref 0–5)
HCT: 27.9 % — ABNORMAL LOW (ref 39.0–52.0)
Hemoglobin: 8.8 g/dL — ABNORMAL LOW (ref 13.0–17.0)
Lymphocytes Relative: 13 % (ref 12–46)
Lymphs Abs: 2 10*3/uL (ref 0.7–4.0)
MCH: 26.3 pg (ref 26.0–34.0)
MCHC: 31.5 g/dL (ref 30.0–36.0)
MCV: 83.3 fL (ref 78.0–100.0)
Monocytes Absolute: 1.2 10*3/uL — ABNORMAL HIGH (ref 0.1–1.0)
Monocytes Relative: 8 % (ref 3–12)
Neutro Abs: 11.7 10*3/uL — ABNORMAL HIGH (ref 1.7–7.7)
Neutrophils Relative %: 79 % — ABNORMAL HIGH (ref 43–77)
Platelets: 534 10*3/uL — ABNORMAL HIGH (ref 150–400)
RBC: 3.35 MIL/uL — ABNORMAL LOW (ref 4.22–5.81)
RDW: 12.8 % (ref 11.5–15.5)
WBC: 15 10*3/uL — ABNORMAL HIGH (ref 4.0–10.5)

## 2013-09-29 NOTE — Evaluation (Signed)
Occupational Therapy Assessment and Plan  Patient Details  Name: [name] MRN: [id number] Date of Birth: 01-13-1956  OT Diagnosis: muscle weakness (generalized) Rehab Potential: Rehab Potential: Good ELOS: 5-7 days   Today's Date: 09/29/2013 Time: 2400-1809 and 7044-9252 Time Calculation (min): 60 min and 60 min  Problem List:  Patient Active Problem List   Diagnosis Date Noted  . Hx of BKA 09/28/2013  . Cellulitis 09/19/2013  . Abdominal pain, unspecified site 09/06/2013  . Noncompliance with medications 07/13/2010  . OTHER SPEC TYPES SCHIZOPHRENIA UNSPEC CONDITION 05/24/2009  . Obesity, unspecified 06/09/2007  . DM (diabetes mellitus), type 2, uncontrolled 05/02/2006  . HYPERTENSION, BENIGN SYSTEMIC 05/02/2006    Past Medical History:  Past Medical History  Diagnosis Date  . Diabetes mellitus   . Hypertension    Past Surgical History:  Past Surgical History  Procedure Laterality Date  . I&d extremity Right 09/24/2013    Procedure: IRRIGATION AND DEBRIDEMENT RIGHT FOOT ULCER;  Surgeon: Sheral Apley, MD;  Location: [facility];  Service: Orthopedics;  Laterality: Right;    Assessment & Plan Clinical Impression: Patient is a 58 y.o. right-handed male with history of hypertension, schizophrenia, diabetes mellitus with peripheral neuropathy and right lower extremity cellulitis. Patient lives alone independent prior to admission and sedentary. Presented 09/19/2013 with right foot also and soft tissue infection. MRI of right foot showed abnormal diffuse subcutaneous and muscular edema consistent with cellulitis. Patient underwent extensive debridement 09/24/2013 per Dr. Eulah Pont and placed on antibiotic therapy. 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 Carola Frost as well as repeat debridement of wound. Hospital course pain management. Stump shrinker ordered per advanced prosthetics. Subcutaneous heparin for DVT prophylaxis.  Acute blood loss anemia 9.3 and monitored. Physical and occupational therapy evaluations completed 09/27/2013 with recommendations for physical medicine rehabilitation consult.    Patient transferred to CIR on 09/28/2013 .    Patient currently requires min with basic self-care skills secondary to muscle weakness and decreased standing balance.  Prior to hospitalization, patient could complete ADLs with independent .  Patient will benefit from skilled intervention to increase independence with basic self-care skills and increase level of independence with iADL prior to discharge home independently.  Anticipate patient will require intermittent supervision and no further OT follow recommended.  OT - End of Session Activity Tolerance: Tolerates 30+ min activity without fatigue Endurance Deficit: No OT Assessment Rehab Potential: Good OT Patient demonstrates impairments in the following area(s): Balance;Endurance;Pain;Skin Integrity OT Basic ADL's Functional Problem(s): Grooming;Bathing;Dressing;Toileting OT Advanced ADL's Functional Problem(s): Simple Meal Preparation OT Transfers Functional Problem(s): Toilet;Tub/Shower OT Additional Impairment(s): None OT Plan OT Intensity: Minimum of 1-2 x/day, 45 to 90 minutes OT Frequency: 5 out of 7 days OT Duration/Estimated Length of Stay: 5-7 days OT Treatment/Interventions: Balance/vestibular training;Discharge planning;Disease Conservator, museum/gallery;Functional mobility training;Pain management;Patient/family education;Psychosocial support;Self Care/advanced ADL retraining;Skin care/wound managment;Therapeutic Activities;Therapeutic Exercise;UE/LE Strength taining/ROM OT Self Feeding Anticipated Outcome(s): independent OT Basic Self-Care Anticipated Outcome(s): mod I OT Toileting Anticipated Outcome(s): mod I OT Bathroom Transfers Anticipated Outcome(s): mod I OT Recommendation Patient destination: Home Follow Up  Recommendations: None Equipment Recommended: Tub/shower bench   Skilled Therapeutic Intervention 1) OT eval initiated, ADL assessment completed at EOB.  Bathing and dressing completed at seated level at EOB with pt demonstrating carryover of lateral leans taught in acute care for perineal hygiene and to don/doff pants.  Stand pivot transfer bed > w/c with min assist for safety.  Grooming conducted at seated level with education  on w/c parts management and propelling w/c to access sink.  Sit > stand x2 with focus on standing balance and tolerance to progress towards integrating standing into grooming and self-care tasks as able.  Educated on UB strength and use of UE to compensate for RLE.  2) Focus of treatment on self-care education regarding limb care and inspection.  Provided pt with inspection mirror and educated on need to inspect residual limb regularly.  Activity with placing stickers on limb to utilize mirror to locate.  Provided pt with handout regarding desensitization, inspection, and donning shrinker.  Fit pt with RW and educated on proper hand placement with sit > stand.  Pt unsteady in standing when attempted to remove one hand from RW.  Educated on tub/shower transfer with use of tub transfer bench, which pt return demonstrated with mod verbal cues for sequencing. Pt with questions regarding necessary equipment and potential LOS.  OT Evaluation Precautions/Restrictions  Precautions Precautions: Fall Precaution Comments: educated on precautions, s/p BKA Restrictions Other Position/Activity Restrictions: elevate operated leg, do not prop knee with pillows,  General   Vital Signs Therapy Vitals Temp: 99.2 F (37.3 C) Temp src: Oral Pulse Rate: 77 Resp: 16 BP: 154/84 mmHg Patient Position (if appropriate): Lying Oxygen Therapy SpO2: 96 % O2 Device: None (Room air) Pain Pain Assessment Pain Assessment: 0-10 Pain Score: 5  Pain Type: Surgical pain Pain Location: Leg Pain  Orientation: Right Pain Intervention(s): RN made aware Multiple Pain Sites: No Home Living/Prior Functioning Home Living Available Help at Discharge: Available PRN/intermittently;Family Type of Home: Apartment Home Access: Level entry (lives on first floor) Home Layout: One level Additional Comments: pt cannot go live with sister per his report  Lives With: Alone IADL History Homemaking Responsibilities: Yes Meal Prep Responsibility: Primary Laundry Responsibility: Primary Cleaning Responsibility: Primary Bill Paying/Finance Responsibility: Primary Shopping Responsibility: Primary Current License: No Mode of Transportation: Bus Occupation: On disability Leisure and Hobbies: going for walks, watching sports Prior Function Level of Independence: Independent with basic ADLs;Independent with homemaking with ambulation;Independent with gait  Able to Take Stairs?: Yes Driving: No Vocation: On disability ADL  See FIM Vision/Perception  Vision- History Baseline Vision/History: Wears glasses (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) Wears Glasses: Reading only Patient Visual Report: No change from baseline Vision- Assessment Vision Assessment?: No apparent visual deficits  Cognition Overall Cognitive Status: History of cognitive impairments - at baseline Arousal/Alertness: Awake/alert Orientation Level: Oriented X4 Memory: Appears intact Problem Solving: Appears intact Safety/Judgment: Appears intact Sensation Sensation Light Touch: Appears Intact Proprioception: Appears Intact Coordination Fine Motor Movements are Fluid and Coordinated: Yes Finger Nose Finger Test: [facility] Mobility  Bed Mobility Bed Mobility: Rolling Right;Right Sidelying to Sit Rolling Right: 6: Modified independent (Device/Increase time) Right Sidelying to Sit: 6: Modified independent (Device/Increase time) Transfers Transfers: Sit to Stand;Stand to Sit  Extremity/Trunk Assessment RUE Assessment RUE  Assessment: Within Functional Limits (grossly 5/5) LUE Assessment LUE Assessment: Within Functional Limits (grossly 5/5)  FIM:  FIM - Eating Eating Activity: 7: Complete independence:no helper FIM - Grooming Grooming Steps: Wash, rinse, dry face;Wash, rinse, dry hands;Oral care, brush teeth, clean dentures Grooming: 5: Set-up assist to obtain items FIM - Bathing Bathing Steps Patient Completed: Chest;Right Arm;Left Arm;Abdomen;Front perineal area;Buttocks;Right upper leg;Left upper leg Bathing: 4: Min-Patient completes 8-9 61f 10 parts or 75+ percent FIM - Upper Body Dressing/Undressing Upper body dressing/undressing steps patient completed: Thread/unthread right sleeve of pullover shirt/dresss;Thread/unthread left sleeve of pullover shirt/dress;Put head through opening of pull over shirt/dress;Pull shirt  over trunk Upper body dressing/undressing: 5: Set-up assist to: Obtain clothing/put away FIM - Lower Body Dressing/Undressing Lower body dressing/undressing steps patient completed: Thread/unthread right underwear leg;Thread/unthread left underwear leg;Pull underwear up/down;Thread/unthread right pants leg;Thread/unthread left pants leg;Pull pants up/down Lower body dressing/undressing: 4: Min-Patient completed 75 plus % of tasks FIM - Control and instrumentation engineer Devices: Arm rests Bed/Chair Transfer: 6: Supine > Sit: No assist;4: Bed > Chair or W/C: Min A (steadying Pt. > 75%)   Refer to Care Plan for Long Term Goals  Recommendations for other services: None  Discharge Criteria: Patient will be discharged from OT if patient refuses treatment 3 consecutive times without medical reason, if treatment goals not met, if there is a change in medical status, if patient makes no progress towards goals or if patient is discharged from hospital.  The above assessment, treatment plan, treatment alternatives and goals were discussed and mutually agreed upon: by patient  Simonne Come 09/29/2013, 8:50 AM

## 2013-09-29 NOTE — Care Management Note (Signed)
[facility] Individual Statement of Services  Patient Name:  [name]  Date:  09/29/2013  Welcome to the [facility].  Our goal is to provide you with an individualized program based on your diagnosis and situation, designed to meet your specific needs.  With this comprehensive rehabilitation program, you will be expected to participate in at least 3 hours of rehabilitation therapies Monday-Friday, with modified therapy programming on the weekends.  Your rehabilitation program will include the following services:  Physical Therapy (PT), Occupational Therapy (OT), 24 hour per day rehabilitation nursing, Neuropsychology, Case Management (Social Worker), Rehabilitation Medicine, Nutrition Services and Pharmacy Services  Weekly team conferences will be held on Wednesday to discuss your progress.  Your Social Worker will talk with you frequently to get your input and to update you on team discussions.  Team conferences with you and your family in attendance may also be held.  Expected length of stay: 5-7 days  Overall anticipated outcome: mod/i level  Depending on your progress and recovery, your program may change. Your Social Worker will coordinate services and will keep you informed of any changes. Your Social Worker's name and contact numbers are listed  below.  The following services may also be recommended but are not provided by the [facility]:   [facility] will be made to provide these services after discharge if needed.  Arrangements include referral to agencies that provide these services.  Your insurance has been verified to be:  medicaid Your primary doctor is:  Dr Tawanna Sat  Pertinent information will be shared with your doctor and your insurance company.  Social Worker:  Ovidio Kin, [place] or [phone]  Information  discussed with and copy given to patient by: Elease Hashimoto, 09/29/2013, 10:02 AM

## 2013-09-29 NOTE — Evaluation (Signed)
Physical Therapy Assessment and Plan  Patient Details  Name: [name] MRN: [id number] Date of Birth: 1955-05-17  PT Diagnosis: Cognitive deficits, Difficulty walking, Edema, Impaired cognition, Impaired sensation and Muscle weakness Rehab Potential: Good ELOS: 5-7 days   Today's Date: 09/29/2013 Time: 8403-7543 Time Calculation (min): 61 min  Problem List:  Patient Active Problem List   Diagnosis Date Noted  . Hx of BKA 09/28/2013  . Cellulitis 09/19/2013  . Abdominal pain, unspecified site 09/06/2013  . Noncompliance with medications 07/13/2010  . OTHER SPEC TYPES SCHIZOPHRENIA UNSPEC CONDITION 05/24/2009  . Obesity, unspecified 06/09/2007  . DM (diabetes mellitus), type 2, uncontrolled 05/02/2006  . HYPERTENSION, BENIGN SYSTEMIC 05/02/2006    Past Medical History:  Past Medical History  Diagnosis Date  . Diabetes mellitus   . Hypertension    Past Surgical History:  Past Surgical History  Procedure Laterality Date  . I&d extremity Right 09/24/2013    Procedure: IRRIGATION AND DEBRIDEMENT RIGHT FOOT ULCER;  Surgeon: Renette Butters, MD;  Location: [facility];  Service: Orthopedics;  Laterality: Right;  . I&d extremity Right 09/26/2013    Procedure: Repeat IRRIGATION AND DEBRIDEMENT Right Foot Ulcer;  Surgeon: Rozanna Box, MD;  Location: [facility];  Service: Orthopedics;  Laterality: Right;  . Amputation Right 09/26/2013    Procedure: AMPUTATION BELOW KNEE;  Surgeon: Rozanna Box, MD;  Location: [facility];  Service: Orthopedics;  Laterality: Right;    Assessment & Plan Clinical Impression: [name] is a 58 y.o. right-handed male with history of hypertension, schizophrenia, diabetes mellitus with peripheral neuropathy and right lower extremity cellulitis. Patient lives alone independent prior to admission and sedentary. Presented 09/19/2013 with right foot also and soft tissue infection. MRI of right foot showed abnormal diffuse subcutaneous and muscular edema  consistent with cellulitis. Patient underwent extensive debridement 09/24/2013 per Dr. Percell Miller and placed on antibiotic therapy. 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 Marcelino Scot as well as repeat debridement of wound. Hospital course pain management. Stump shrinker ordered per advanced prosthetics. Subcutaneous heparin for DVT prophylaxis. Acute blood loss anemia 9.3 and monitored. Physical and occupational therapy evaluations completed 09/27/2013 with recommendations for physical medicine rehabilitation consult. Patient was admitted for comprehensive rehabilitation program. Patient transferred to CIR on 09/28/2013.   Patient currently requires supervision- min with mobility secondary to muscle weakness, decreased cardiorespiratoy endurance, delayed processing and decreased standing balance and decreased balance strategies.  Prior to hospitalization, patient was independent  with mobility and lived with Alone in a [place] home.  Home access is  Level entry (lives on first floor).  Patient will benefit from skilled PT intervention to maximize safe functional mobility, minimize fall risk and decrease caregiver burden for planned discharge home with intermittent assist.  Anticipate patient will benefit from follow up [facility] at discharge.  PT - End of Session Activity Tolerance: Tolerates 30+ min activity without fatigue Endurance Deficit: No PT Assessment Rehab Potential: Good Barriers to Discharge: Decreased caregiver support PT Patient demonstrates impairments in the following area(s): Balance;Edema;Endurance;Motor;Pain;Safety;Sensory;Skin Integrity PT Transfers Functional Problem(s): Bed Mobility;Bed to Chair;Car;Furniture PT Locomotion Functional Problem(s): Ambulation;Wheelchair Mobility;Stairs PT Plan PT Intensity: Minimum of 1-2 x/day ,45 to 90 minutes PT Frequency: 5 out of 7 days PT Duration Estimated Length of Stay: 5-7 days PT  Treatment/Interventions: Ambulation/gait training;Disease management/prevention;Pain management;Stair training;Wheelchair propulsion/positioning;Therapeutic Activities;Patient/family education;DME/adaptive equipment instruction;Balance/vestibular training;Cognitive remediation/compensation;Psychosocial support;Therapeutic Exercise;UE/LE Strength taining/ROM;Community reintegration;Functional mobility training;Skin care/wound management;UE/LE Coordination activities;Splinting/orthotics;Neuromuscular re-education;Discharge planning PT Transfers Anticipated Outcome(s): mod I  PT Locomotion Anticipated Outcome(s): mod I PT Recommendation Follow Up Recommendations: Home health PT;Outpatient PT Patient destination: Home Equipment Recommended: To be determined;Wheelchair (measurements);Wheelchair cushion (measurements);Rolling walker with 5" wheels Equipment Details: Patient does not own any DME; recommendations TBD upon discharge  Skilled Therapeutic Intervention Skilled therapeutic intervention initiated after completion of evaluation. Discussed falls risk, safety within room, and focus of therapy during stay. Discussed possible LOS, goals, and f/u therapy. Patient performed car transfer via squat pivot and supervision/set-up assist. LE therex initiated: sitting R LE LAQ, L LE bridges in supine, sidelying R LE abd x20 each. Emphasis on education on proper set up of wheelchair for transfers and patient able to return demonstrate proper set up with exception of forgetting to remove arm rest. Patient returned to room and left sitting in wheelchair with all needs within reach.  PT Evaluation Precautions/Restrictions Precautions Precautions: Fall Precaution Comments: educated on precautions, s/p BKA Restrictions Weight Bearing Restrictions: Yes RLE Weight Bearing: Non weight bearing Other Position/Activity Restrictions: elevate operated leg, do not prop knee with pillows,  General Chart Reviewed:  Yes Family/Caregiver Present: No  Pain Pain Assessment Pain Assessment: No/denies pain Pain Score: 0-No pain Home Living/Prior Functioning Home Living Available Help at Discharge: Available PRN/intermittently;Family Type of Home: Apartment Home Access: Level entry (lives on first floor) Home Layout: One level Additional Comments: patient cannot go live with sister per his report  Lives With: Alone Prior Function Level of Independence: Independent with basic ADLs;Independent with homemaking with ambulation;Independent with gait  Able to Take Stairs?: Yes Driving: No Vocation: On disability Vision/Perception    Blind in L eye for last 2 years Wears reading glasses Praxis WFL Cognition Overall Cognitive Status: History of cognitive impairments - at baseline Arousal/Alertness: Awake/alert Orientation Level: Oriented X4 Memory: Appears intact Problem Solving: Appears intact Safety/Judgment: Appears intact Sensation Sensation Light Touch: Impaired Detail Light Touch Impaired Details: Impaired RLE Proprioception: Appears Intact Additional Comments: Impaired sensation R LE on lateral aspect of residual limb Coordination Gross Motor Movements are Fluid and Coordinated: Yes Fine Motor Movements are Fluid and Coordinated: Yes Motor  Motor Motor: Within Functional Limits  Mobility Bed Mobility Bed Mobility: Supine to Sit;Sit to Supine Supine to Sit: HOB flat;6: Modified independent (Device/Increase time) Sit to Supine: HOB flat;6: Modified independent (Device/Increase time) Transfers Transfers: Yes Sit to Stand: 4: Min guard;With upper extremity assist;From chair/3-in-1;From bed;With armrests Stand to Sit: 5: Supervision;With armrests;With upper extremity assist;To chair/3-in-1;To bed Stand to Sit Details (indicate cue type and reason): Verbal cues for precautions/safety;Verbal cues for technique;Verbal cues for sequencing Stand Pivot Transfers: 5: Supervision;With  armrests Stand Pivot Transfer Details (indicate cue type and reason): assistance/supervision for set up of transfer only Locomotion  Ambulation Ambulation: Yes Ambulation/Gait Assistance: 4: Min guard;5: Supervision Ambulation Distance (Feet): 38 Feet (x2) Assistive device: Rolling walker Ambulation/Gait Assistance Details: Verbal cues for gait pattern;Verbal cues for precautions/safety;Verbal cues for safe use of DME/AE Ambulation/Gait Assistance Details: Patient performed gait training in controlled environment 106' x2 with RW and min guard progressing to close supervision. Patient requires cues for appropriate pacing and to maintain BOS within RW. Gait Gait: Yes Gait Pattern: Impaired Gait Pattern: Step-to pattern;Decreased step length - left;Trunk flexed Stairs / Additional Locomotion Stairs: Yes Stairs Assistance: 4: Min guard;2: Max assist Stairs Assistance Details: Visual cues/gestures for sequencing;Verbal cues for precautions/safety;Verbal cues for technique;Verbal cues for sequencing;Verbal cues for safe use of DME/AE Stairs Assistance Details (indicate cue type and reason): Patient ascends backwards, descends forwards with RW; min guard for  ascending and descending; maxA for LOB when attempting to lift RW from floor onto curb. Stair Management Technique: With walker;Forwards;Backwards;Step to pattern Curb: 4: Min assist;2: Max assist (maxA for LOB when lifting RW from floor onto curb) Architect: Yes Wheelchair Assistance: 5: Supervision Wheelchair Propulsion: Both upper extremities;Left lower extremity Wheelchair Parts Management: Needs assistance;Supervision/cueing (progresses to supervision/cueing after demonstration) Distance: 200  Trunk/Postural Assessment  Cervical Assessment Cervical Assessment: Within Functional Limits Thoracic Assessment Thoracic Assessment: Within Functional Limits Lumbar Assessment Lumbar Assessment: Within Functional  Limits Postural Control Postural Control: Within Functional Limits  Balance Balance Balance Assessed: Yes Static Sitting Balance Static Sitting - Balance Support: Bilateral upper extremity supported;No upper extremity supported;Feet supported (L LE supported) Static Sitting - Level of Assistance: 6: Modified independent (Device/Increase time) Static Standing Balance Static Standing - Balance Support: Bilateral upper extremity supported;During functional activity Static Standing - Level of Assistance: 4: Min assist;5: Stand by assistance (with RW) Extremity Assessment  RLE Assessment RLE Assessment: Exceptions to [facility] RLE Strength RLE Overall Strength: Deficits;Due to precautions RLE Overall Strength Comments: No formal MMT performed secondary to c/o phantom pain; able to lift R LE against gravity LLE Assessment LLE Assessment: Within Functional Limits (grossly 4/5)  FIM:  FIM - Bed/Chair Transfer Bed/Chair Transfer Assistive Devices: Arm rests Bed/Chair Transfer: 6: Supine > Sit: No assist;6: Sit > Supine: No assist;5: Bed > Chair or W/C: Supervision (verbal cues/safety issues);5: Chair or W/C > Bed: Supervision (verbal cues/safety issues) FIM - Locomotion: Wheelchair Distance: 200 Locomotion: Wheelchair: 5: Travels 150 ft or more: maneuvers on rugs and over door sills with supervision, cueing or coaxing FIM - Locomotion: Ambulation Ambulation/Gait Assistance: 4: Min guard;5: Supervision Locomotion: Ambulation: 1: Travels less than 50 ft with minimal assistance (Pt.>75%) FIM - Locomotion: Stairs Locomotion: Scientist, physiological: Publishing copy: Stairs: 1: Up and Down < 4 stairs with maximal assistance (Pt: 25 - 49%)   Refer to Care Plan for Long Term Goals  Recommendations for other services: None  Discharge Criteria: Patient will be discharged from PT if patient refuses treatment 3 consecutive times without medical reason, if treatment goals not met, if there is a change  in medical status, if patient makes no progress towards goals or if patient is discharged from hospital.  The above assessment, treatment plan, treatment alternatives and goals were discussed and mutually agreed upon: by patient  Lillia Abed. Ripa, PT, DPT 09/29/2013, 4:12 PM

## 2013-09-29 NOTE — Progress Notes (Signed)
Subjective/Complaints: 58 y.o. right-handed male with history of hypertension, schizophrenia, diabetes mellitus with peripheral neuropathy and right lower extremity cellulitis. Patient lives alone independent prior to admission and sedentary. Presented 09/19/2013 with right foot also and soft tissue infection. MRI of right foot showed abnormal diffuse subcutaneous and muscular edema consistent with cellulitis. Patient underwent extensive debridement 09/24/2013 per Dr. Percell Miller and placed on antibiotic therapy. 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 Marcelino Scot as well as repeat debridement of wound  Pt thirsty but no other c/os Bowel and bladder doing ok, appetite improving No Phantom pains, stump pain controlled  Objective: Vital Signs: Blood pressure 154/84, pulse 77, temperature 99.2 F (37.3 C), temperature source Oral, resp. rate 16, height 6' (1.829 m), weight 105.1 kg (231 lb 11.3 oz), SpO2 96.00%. Dg Tibia/fibula Right Port  09/27/2013   CLINICAL DATA:  58 year old male -right BKA.  EXAM: PORTABLE RIGHT TIBIA AND FIBULA - 2 VIEW  COMPARISON:  None.  FINDINGS: BKA changes noted.  No complicating features are identified.  Mild degenerative changes in the knee are present.  IMPRESSION: BKA without complicating features.   Electronically Signed   By: Hassan Rowan M.D.   On: 09/27/2013 16:51   Results for orders placed during the hospital encounter of 09/28/13 (from the past 72 hour(s))  GLUCOSE, CAPILLARY     Status: None   Collection Time    09/28/13  5:00 PM      Result Value Ref Range   Glucose-Capillary 79  70 - 99 mg/dL  CBC WITH DIFFERENTIAL     Status: Abnormal   Collection Time    09/29/13  5:45 AM      Result Value Ref Range   WBC 15.0 (*) 4.0 - 10.5 K/uL   RBC 3.35 (*) 4.22 - 5.81 MIL/uL   Hemoglobin 8.8 (*) 13.0 - 17.0 g/dL   HCT 27.9 (*) 39.0 - 52.0 %   MCV 83.3  78.0 - 100.0 fL   MCH 26.3  26.0 - 34.0 pg   MCHC 31.5   30.0 - 36.0 g/dL   RDW 12.8  11.5 - 15.5 %   Platelets 534 (*) 150 - 400 K/uL   Neutrophils Relative % 79 (*) 43 - 77 %   Neutro Abs 11.7 (*) 1.7 - 7.7 K/uL   Lymphocytes Relative 13  12 - 46 %   Lymphs Abs 2.0  0.7 - 4.0 K/uL   Monocytes Relative 8  3 - 12 %   Monocytes Absolute 1.2 (*) 0.1 - 1.0 K/uL   Eosinophils Relative 0  0 - 5 %   Eosinophils Absolute 0.1  0.0 - 0.7 K/uL   Basophils Relative 0  0 - 1 %   Basophils Absolute 0.0  0.0 - 0.1 K/uL     HEENT: normal Cardio: RRR and no murmur Resp: CTA B/L and unlabored GI: BS positive and NT,ND Extremity:  Edema distal stump Skin:   Other R BKA with stump shrinker Neuro: Alert/Oriented, Abnormal Sensory absent LT in Left foot and Abnormal Motor 4/5 in Left HF, KE ANkle DF Musc/Skel:  Normal and Other Right mid tib BKA Gen NAD   Assessment/Plan: 1. Functional deficits secondary to Right BKA which require 3+ hours per day of interdisciplinary therapy in a comprehensive inpatient rehab setting. Physiatrist is providing close team supervision and 24 hour management of active medical problems listed below. Physiatrist and rehab team continue to assess barriers to discharge/monitor patient progress toward functional and  medical goals. FIM:          FIM - Radio producer Devices: Bedside commode Toilet Transfers: 2-To toilet/BSC: Max A (lift and lower assist);2-From toilet/BSC: Max A (lift and lower assist)        Comprehension Comprehension Mode: Auditory Comprehension: 5-Follows basic conversation/direction: With extra time/assistive device  Expression Expression Mode: Verbal Expression: 4-Expresses basic 75 - 89% of the time/requires cueing 10 - 24% of the time. Needs helper to occlude trach/needs to repeat words.  Social Interaction Social Interaction: 4-Interacts appropriately 75 - 89% of the time - Needs redirection for appropriate language or to initiate interaction.  Problem  Solving Problem Solving: 4-Solves basic 75 - 89% of the time/requires cueing 10 - 24% of the time  Memory Memory: 4-Recognizes or recalls 75 - 89% of the time/requires cueing 10 - 24% of the time  Medical Problem List and Plan:  1. Functional deficits secondary to right BKA 09/26/2013 secondary to progressive ischemia. Stump shrinker ordered per advanced prosthetics.  2. DVT Prophylaxis/Anticoagulation: Subcutaneous heparin. Monitor platelet counts and any signs of bleeding  3. Pain Management: Neurontin 300 mg 3 times a day, oxycodone as needed. Monitor with increased mobility  4. Mood/schizophrenia: Patient appears at baseline  5. Neuropsych: This patient is capable of making decisions on his own behalf.  6. Skin/Wound Care: Routine skin care. Monitor right below-knee amputation site.  7. Acute blood loss anemia. Followup CBC  8. Hypertension. Lisinopril 20 mg daily. Monitor with increased mobility  9. Diabetes mellitus with peripheral neuropathy. Latest hemoglobin A1c of 14. Lantus insulin 30 units each bedtime. Check blood sugars a.c. and at bedtime. Provide diabetic teaching    LOS (Days) 1 A FACE TO FACE EVALUATION WAS PERFORMED  KIRSTEINS,ANDREW E 09/29/2013, 6:45 AM

## 2013-09-29 NOTE — Progress Notes (Signed)
Social Work Assessment and Plan Social Work Assessment and Plan  Patient Details  Name: [name] MRN: [id number] Date of Birth: Sep 29, 1955  Today's Date: 09/29/2013  Problem List:  Patient Active Problem List   Diagnosis Date Noted  . Hx of BKA 09/28/2013  . Cellulitis 09/19/2013  . Abdominal pain, unspecified site 09/06/2013  . Noncompliance with medications 07/13/2010  . OTHER SPEC TYPES SCHIZOPHRENIA UNSPEC CONDITION 05/24/2009  . Obesity, unspecified 06/09/2007  . DM (diabetes mellitus), type 2, uncontrolled 05/02/2006  . HYPERTENSION, BENIGN SYSTEMIC 05/02/2006   Past Medical History:  Past Medical History  Diagnosis Date  . Diabetes mellitus   . Hypertension    Past Surgical History:  Past Surgical History  Procedure Laterality Date  . I&d extremity Right 09/24/2013    Procedure: IRRIGATION AND DEBRIDEMENT RIGHT FOOT ULCER;  Surgeon: Renette Butters, MD;  Location: [facility];  Service: Orthopedics;  Laterality: Right;  . I&d extremity Right 09/26/2013    Procedure: Repeat IRRIGATION AND DEBRIDEMENT Right Foot Ulcer;  Surgeon: Rozanna Box, MD;  Location: [facility];  Service: Orthopedics;  Laterality: Right;  . Amputation Right 09/26/2013    Procedure: AMPUTATION BELOW KNEE;  Surgeon: Rozanna Box, MD;  Location: [facility];  Service: Orthopedics;  Laterality: Right;   Social History:  reports that he has never smoked. He does not have any smokeless tobacco history on file. He reports that he does not drink alcohol or use illicit drugs.  Family / Support Systems Marital Status: Single Patient Roles: Other (Comment) (Sibling) Other Supports: Alice Burton-sister  [phone] Anticipated Caregiver: Sister in [place] also Ability/Limitations of Caregiver: Both sister can only check in on pt Caregiver Availability: Intermittent Family Dynamics: Pt is close with his local sister, but she works and he does not want to bother her.  He has always been on his own and wants  to remain so.  He feels he will get independent once he recovers from this surgery.  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   Abuse/Neglect Physical Abuse: Denies Verbal Abuse: Denies Sexual Abuse: Denies Exploitation of patient/patient's resources: Denies Self-Neglect: Denies  Emotional Status Pt's affect, behavior adn adjustment status: Pt is motivated to improve and regain his independence, he has always been independent he prides himself on this.  He feels also he has to since he has a sister here but doesn't want to burden her.  He is a quiet man who keeps to himself, he tlaks when spoken to but is otherwise quiet. Recent Psychosocial Issues: Other health issues Pyschiatric History: History of schizophrenia-he currently is not on any meds for this.  He doesn;t see anyone either for this.  When asked if he wanted to see someone regarding he replied no.  Will monitor and ask for input from MD and team. Substance Abuse History: No issues  Patient / Family Perceptions, Expectations & Goals Pt/Family understanding of illness & functional limitations: Pt is able to explain his surgery and treatment.  He is still getting used to the fact he lost his leg and the challenges it presents.  He is learning to ambulate and use his other limbs.  He wants to recover so he can prepare for a prothesis also, so when he is healed he can get one. Premorbid pt/family roles/activities:  Brother, Retiree, Friend, etc Anticipated changes in roles/activities/participation: resume Pt/family expectations/goals: Pt states: " I want to be able to take care of myself before I leave here."  Sister states: " I can only check on him at home."  [employer]: None Premorbid Home Care/DME Agencies: None Transportation available at discharge: Uses bus system Resource referrals recommended: Support group (specify) ([employer]put[facility] SU[facility] group)  Discharge Planning Living Arrangements: Alone Support Systems: Other relatives;Friends/neighbors Type of Residence: Private residence Insurance Resources: Medicaid (specify county) ([place]) Museum/gallery curator Resources: SSI Financial Screen Referred: Previously completed Living Expenses: Education officer, community Management: Patient Does the patient have any problems obtaining your medications?: No Home Management: Patient Patient/Family Preliminary Plans: Return home with his sister checking on him intermittently.  He needs to be mod/i before going home from here.  Will connect him with any eligable community resources.   Social Work Anticipated Follow Up Needs: HH/OP;Support Group  Clinical Impression Pleasant quiet gentleman who is willing to work hard and regain his independence before returning home.  His sister can check on him but this is all.  He needs to be mod/i before being discharged from here. He feels he does not need follow up for his schizophrenia and feels he is managing well.  Will await team conference tomorrow and come up with a safe discharge plan.  Elease Hashimoto 09/29/2013, 11:51 AM

## 2013-09-30 ENCOUNTER — Encounter (HOSPITAL_COMMUNITY): Payer: Medicaid Other | Admitting: Occupational Therapy

## 2013-09-30 ENCOUNTER — Inpatient Hospital Stay (HOSPITAL_COMMUNITY): Payer: Medicaid Other | Admitting: Occupational Therapy

## 2013-09-30 ENCOUNTER — Inpatient Hospital Stay (HOSPITAL_COMMUNITY): Payer: Medicaid Other | Admitting: Physical Therapy

## 2013-09-30 LAB — GLUCOSE, CAPILLARY
Glucose-Capillary: 119 mg/dL — ABNORMAL HIGH (ref 70–99)
Glucose-Capillary: 190 mg/dL — ABNORMAL HIGH (ref 70–99)
Glucose-Capillary: 115 mg/dL — ABNORMAL HIGH (ref 70–99)
Glucose-Capillary: 199 mg/dL — ABNORMAL HIGH (ref 70–99)

## 2013-09-30 NOTE — Progress Notes (Signed)
Subjective/Complaints: 58 y.o. right-handed male with history of hypertension, schizophrenia, diabetes mellitus with peripheral neuropathy and right lower extremity cellulitis. Patient lives alone independent prior to admission and sedentary. Presented 09/19/2013 with right foot also and soft tissue infection. MRI of right foot showed abnormal diffuse subcutaneous and muscular edema consistent with cellulitis. Patient underwent extensive debridement 09/24/2013 per Dr. Percell Miller and placed on antibiotic therapy. 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 Marcelino Scot as well as repeat debridement of wound  Pt states he slept ok Bowel last moved 24 h ago, appetite improving No Phantom pains, stump pain controlled  Objective: Vital Signs: Blood pressure 155/90, pulse 62, temperature 98.5 F (36.9 C), temperature source Oral, resp. rate 18, height 6' (1.829 m), weight 105.1 kg (231 lb 11.3 oz), SpO2 93.00%. No results found. Results for orders placed during the hospital encounter of 09/28/13 (from the past 72 hour(s))  GLUCOSE, CAPILLARY     Status: None   Collection Time    09/28/13  5:00 PM      Result Value Ref Range   Glucose-Capillary 79  70 - 99 mg/dL  CBC WITH DIFFERENTIAL     Status: Abnormal   Collection Time    09/29/13  5:45 AM      Result Value Ref Range   WBC 15.0 (*) 4.0 - 10.5 K/uL   RBC 3.35 (*) 4.22 - 5.81 MIL/uL   Hemoglobin 8.8 (*) 13.0 - 17.0 g/dL   HCT 27.9 (*) 39.0 - 52.0 %   MCV 83.3  78.0 - 100.0 fL   MCH 26.3  26.0 - 34.0 pg   MCHC 31.5  30.0 - 36.0 g/dL   RDW 12.8  11.5 - 15.5 %   Platelets 534 (*) 150 - 400 K/uL   Neutrophils Relative % 79 (*) 43 - 77 %   Neutro Abs 11.7 (*) 1.7 - 7.7 K/uL   Lymphocytes Relative 13  12 - 46 %   Lymphs Abs 2.0  0.7 - 4.0 K/uL   Monocytes Relative 8  3 - 12 %   Monocytes Absolute 1.2 (*) 0.1 - 1.0 K/uL   Eosinophils Relative 0  0 - 5 %   Eosinophils Absolute 0.1  0.0 - 0.7 K/uL    Basophils Relative 0  0 - 1 %   Basophils Absolute 0.0  0.0 - 0.1 K/uL  COMPREHENSIVE METABOLIC PANEL     Status: Abnormal   Collection Time    09/29/13  5:45 AM      Result Value Ref Range   Sodium 141  137 - 147 mEq/L   Potassium 4.4  3.7 - 5.3 mEq/L   Chloride 102  96 - 112 mEq/L   CO2 28  19 - 32 mEq/L   Glucose, Bld 97  70 - 99 mg/dL   BUN 10  6 - 23 mg/dL   Creatinine, Ser 1.06  0.50 - 1.35 mg/dL   Calcium 8.7  8.4 - 10.5 mg/dL   Total Protein 7.2  6.0 - 8.3 g/dL   Albumin 2.1 (*) 3.5 - 5.2 g/dL   AST 85 (*) 0 - 37 U/L   ALT 65 (*) 0 - 53 U/L   Alkaline Phosphatase 103  39 - 117 U/L   Total Bilirubin 0.3  0.3 - 1.2 mg/dL   GFR calc non Af Amer 77 (*) >90 mL/min   GFR calc Af Amer 89 (*) >90 mL/min   Comment: (NOTE)     The  eGFR has been calculated using the CKD EPI equation.     This calculation has not been validated in all clinical situations.     eGFR's persistently <90 mL/min signify possible Chronic Kidney     Disease.   Anion gap 11  5 - 15  GLUCOSE, CAPILLARY     Status: None   Collection Time    09/29/13  7:09 AM      Result Value Ref Range   Glucose-Capillary 88  70 - 99 mg/dL   Comment 1 Notify RN    GLUCOSE, CAPILLARY     Status: Abnormal   Collection Time    09/29/13 11:06 AM      Result Value Ref Range   Glucose-Capillary 107 (*) 70 - 99 mg/dL   Comment 1 Notify RN    GLUCOSE, CAPILLARY     Status: Abnormal   Collection Time    09/29/13  4:39 PM      Result Value Ref Range   Glucose-Capillary 137 (*) 70 - 99 mg/dL   Comment 1 Notify RN     Comment 2 Documented in Chart    GLUCOSE, CAPILLARY     Status: Abnormal   Collection Time    09/29/13  9:06 PM      Result Value Ref Range   Glucose-Capillary 140 (*) 70 - 99 mg/dL     HEENT: normal Cardio: RRR and no murmur Resp: CTA B/L and unlabored GI: BS positive and NT,ND Extremity:  Edema distal stump Skin:   Other R BKA with stump shrinker Neuro: Alert/Oriented, Abnormal Sensory absent LT in  Left foot and Abnormal Motor 4/5 in Left HF, KE ANkle DF Musc/Skel:  Normal and Other Right mid tib BKA Gen NAD   Assessment/Plan: 1. Functional deficits secondary to Right BKA which require 3+ hours per day of interdisciplinary therapy in a comprehensive inpatient rehab setting. Physiatrist is providing close team supervision and 24 hour management of active medical problems listed below. Physiatrist and rehab team continue to assess barriers to discharge/monitor patient progress toward functional and medical goals. FIM: FIM - Bathing Bathing Steps Patient Completed: Chest;Right Arm;Left Arm;Abdomen;Front perineal area;Buttocks;Right upper leg;Left upper leg Bathing: 4: Min-Patient completes 8-9 20f10 parts or 75+ percent  FIM - Upper Body Dressing/Undressing Upper body dressing/undressing steps patient completed: Thread/unthread right sleeve of pullover shirt/dresss;Thread/unthread left sleeve of pullover shirt/dress;Put head through opening of pull over shirt/dress;Pull shirt over trunk Upper body dressing/undressing: 5: Set-up assist to: Obtain clothing/put away FIM - Lower Body Dressing/Undressing Lower body dressing/undressing steps patient completed: Thread/unthread right underwear leg;Thread/unthread left underwear leg;Pull underwear up/down;Thread/unthread right pants leg;Thread/unthread left pants leg;Pull pants up/down Lower body dressing/undressing: 4: Min-Patient completed 75 plus % of tasks     FIM - TRadio producerDevices: Bedside commode Toilet Transfers: 2-To toilet/BSC: Max A (lift and lower assist);2-From toilet/BSC: Max A (lift and lower assist)  FIM - BEngineer, siteAssistive Devices: Arm rests Bed/Chair Transfer: 6: Supine > Sit: No assist;6: Sit > Supine: No assist;5: Bed > Chair or W/C: Supervision (verbal cues/safety issues);5: Chair or W/C > Bed: Supervision (verbal cues/safety issues)  FIM - Locomotion:  Wheelchair Distance: 200 Locomotion: Wheelchair: 5: Travels 150 ft or more: maneuvers on rugs and over door sills with supervision, cueing or coaxing FIM - Locomotion: Ambulation Ambulation/Gait Assistance: 4: Min guard;5: Supervision Locomotion: Ambulation: 1: Travels less than 50 ft with minimal assistance (Pt.>75%)  Comprehension Comprehension Mode: Auditory Comprehension: 5-Follows basic conversation/direction: With  no assist  Expression Expression Mode: Verbal Expression: 4-Expresses basic 75 - 89% of the time/requires cueing 10 - 24% of the time. Needs helper to occlude trach/needs to repeat words.  Social Interaction Social Interaction: 4-Interacts appropriately 75 - 89% of the time - Needs redirection for appropriate language or to initiate interaction.  Problem Solving Problem Solving: 4-Solves basic 75 - 89% of the time/requires cueing 10 - 24% of the time  Memory Memory: 4-Recognizes or recalls 75 - 89% of the time/requires cueing 10 - 24% of the time  Medical Problem List and Plan:  1. Functional deficits secondary to right BKA 09/26/2013 secondary to progressive ischemia. Stump shrinker ordered per advanced prosthetics.  2. DVT Prophylaxis/Anticoagulation: Subcutaneous heparin. Monitor platelet counts and any signs of bleeding  3. Pain Management: Neurontin 300 mg 3 times a day, oxycodone as needed. Monitor with increased mobility  4. Mood/schizophrenia: Patient appears at baseline  5. Neuropsych: This patient is capable of making decisions on his own behalf.  6. Skin/Wound Care: Routine skin care. Monitor right below-knee amputation site.  7. Acute blood loss anemia. Followup CBC  8. Hypertension. Lisinopril 20 mg daily. Monitor with increased mobility  9. Diabetes mellitus with peripheral neuropathy. Latest hemoglobin A1c of 14. Lantus insulin 30 units each bedtime. Check blood sugars a.c. and at bedtime. Provide diabetic teaching    LOS (Days) 2 A FACE TO FACE  EVALUATION WAS PERFORMED  KIRSTEINS,ANDREW E 09/30/2013, 7:26 AM

## 2013-09-30 NOTE — Patient Care Conference (Signed)
Inpatient RehabilitationTeam Conference and Plan of Care Update Date: 09/30/2013   Time: 10;53 AM    Patient Name: [name]      Medical Record Number: [id number]  Date of Birth: 27-Jul-1955 Sex: Male         Room/Bed: 4M07C/4M07C-01 Payor Info: Payor: MEDICAID [place] / Plan: MEDICAID [place] ACCESS / Product Type: *No Product type* /    Admitting Diagnosis: R BKA  Admit Date/Time:  09/28/2013  4:34 PM Admission Comments: No comment available   Primary Diagnosis:  <principal problem not specified> Principal Problem: <principal problem not specified>  Patient Active Problem List   Diagnosis Date Noted  . Hx of BKA 09/28/2013  . Cellulitis 09/19/2013  . Abdominal pain, unspecified site 09/06/2013  . Noncompliance with medications 07/13/2010  . OTHER SPEC TYPES SCHIZOPHRENIA UNSPEC CONDITION 05/24/2009  . Obesity, unspecified 06/09/2007  . DM (diabetes mellitus), type 2, uncontrolled 05/02/2006  . HYPERTENSION, BENIGN SYSTEMIC 05/02/2006    Expected Discharge Date: Expected Discharge Date: 10/03/13  Team Members Present: Physician leading conference: Dr. Alysia Penna Social Worker Present: Ovidio Kin, LCSW Nurse Present: Rayetta Humphrey, RN PT Present: Raylene Everts, PT;Blair Hobble, PT OT Present: Clyda Greener, OT SLP Present: Windell Moulding, SLP PPS Coordinator present : Ileana Ladd, Lelan Pons, RN, CRRN     Current Status/Progress Goal Weekly Team Focus  Medical   no new medical issues, pain controlled, wound looks great  Mod I mobility  D/C planning   Bowel/Bladder   Continent of bowel and bladder. LBM 09/28/13  Pt to remain continent of bowel and bladder  Monitor   Swallow/Nutrition/ Hydration     WFL        ADL's   min assist bathing and dressing, min assist squat pivot transfer  mod I overall  limb care, standing balance with self-care tasks, functional transfers   Mobility   supervision-intermittent min  mod I  safety, functional mobility, balance,  education, activity tolerance   Communication     [facility]        Safety/Cognition/ Behavioral Observations    no unsafe behaviors        Pain   Oxy IR 10mg  prn q 4hrs  <5  Offer pain medication 1 hr prior to therapy session   Skin   R BKA with incisional sutures approximated. Old drainage to gauze, Stump shrinker intact  No additional skin breakdown  Monitor for s/s of infection      *See Care Plan and progress notes for long and short-term goals.  Barriers to Discharge: Needs equip    Possible Resolutions to Barriers:  ID equipment needs    Discharge Planning/Teaching Needs:  Home alone with intermittent assist from sister.  Pt needs to be mod/i level before going home.      Team Discussion:  Making good progress, will be ready Sat at mod/i level.  Wound looks good.  Has stump shrinker already  Revisions to Treatment Plan:  None   Continued Need for Acute Rehabilitation Level of Care: The patient requires daily medical management by a physician with specialized training in physical medicine and rehabilitation for the following conditions: Daily direction of a multidisciplinary physical rehabilitation program to ensure safe treatment while eliciting the highest outcome that is of practical value to the patient.: Yes Daily medical management of patient stability for increased activity during participation in an intensive rehabilitation regime.: Yes Daily analysis of laboratory values and/or radiology reports with any subsequent need for medication adjustment of medical intervention  for : Other;Neurological problems;Post surgical problems  Dupree, Gardiner Rhyme 10/01/2013, 2:46 PM

## 2013-09-30 NOTE — Discharge Summary (Signed)
Family Medicine Teaching Service  Discharge Note : Attending Jeff Walden MD Pager [phone] Inpatient Team Pager:  [phone]  I have reviewed this patient and the patient's chart and have discussed discharge planning with the resident at the time of discharge. I agree with the discharge plan as above.    

## 2013-09-30 NOTE — Progress Notes (Signed)
Social Work Patient ID: [name], male   DOB: 12-05-1955, 58 y.o.   MRN: [id number] Met with pt to inform of team conference goals mod/i level and discharge 8/1.  He is very pleased with and glad to be making the progress he is making. He feels he will be ready for Sat.  Will get DME and follow up needs form therapists.  He is agreeable to this.  Work toward discharge Sat.

## 2013-09-30 NOTE — Progress Notes (Signed)
Occupational Therapy Session Note  Patient Details  Name: [name] MRN: [id number] Date of Birth: 1955/10/02  Today's Date: 09/30/2013 Time: 0730-0830 and 3810-1751 Time Calculation (min): 60 min and 30 min  Short Term Goals: Week 1:  OT Short Term Goal 1 (Week 1): STG = LTGs due to ELOS  Skilled Therapeutic Interventions/Progress Updates:    1) Engaged in ADL retraining with focus on safety with transfers, sit > stand, and care of residual limb.  Pt in bed upon arrival with shrinker removed, pt reports unable to don shrinker independently.  Educated on proper donning and plan to further focus on pt increasing independence with donning shrinker.  Completed squat pivot bed > w/c with supervision, verbal cues to remove arm rest prior to transfer.  Bathing and dressing completed at sit > stand level at sink with min/steady assist while pulling pants over hips due to having only 1 UE support.  Pt propelled w/c to laundry room and stood to load washing machine with close supervision.  2) Focus on functional mobility and toilet transfers with RW.  Pt's bathroom is not accessible via w/c and will most likely have to sidestep into bathroom with RW.  Ambulated to bathroom x2 with focus on sidestepping with pt able to ambulate forward with supervision and requiring min assist/min guard with sidestepping especially over threshold.  Recommend pt have BSC placed over toilet to allow for hand hold and elevate toilet seat to increase safety and independence.  Performed toilet transfer again with BSC.  Discussed care of residual limb, with pt reporting that is his biggest concern regarding d/c.  Informed RN to begin training and plan to further incorporate limb care into treatment sessions.  Therapy Documentation Precautions:  Precautions Precautions: Fall Precaution Comments: educated on precautions, s/p BKA Restrictions Weight Bearing Restrictions: Yes RLE Weight Bearing: Non weight bearing Other  Position/Activity Restrictions: elevate operated leg, do not prop knee with pillows,  General:   Vital Signs: Therapy Vitals Temp: 98.5 F (36.9 C) Temp src: Oral Pulse Rate: 62 Resp: 18 BP: 155/90 mmHg Patient Position (if appropriate): Lying Oxygen Therapy SpO2: 93 % O2 Device: None (Room air) Pain: Pain Assessment Pain Assessment: No/denies pain Pain Score: 7   See FIM for current functional status  Therapy/Group: Individual Therapy  Simonne Come 09/30/2013, 9:54 AM

## 2013-09-30 NOTE — Progress Notes (Signed)
I participated in the care of this patient and agree with the above history, physical and evaluation. I performed a review of the history and a physical exam as detailed   Timothy Daniel Murphy MD  

## 2013-09-30 NOTE — Progress Notes (Signed)
Physical Therapy Session Note  Patient Details  Name: JEHAD BISONO MRN: [id number] Date of Birth: 20-Jan-1956  Today's Date: 09/30/2013 Time: 1118-1207 and 2197-5883 Time Calculation (min): 49 min and 43 min  Short Term Goals: Week 1:  PT Short Term Goal 1 (Week 1): STGs=LTGs due to ELOS  Skilled Therapeutic Interventions/Progress Updates:    Treatment Session 1: Pt received seated in w/c; agreeable to therapy. Session focused on side stepping and curb negotiation. Pt performed w/c mobility >200' in controlled environment with supervision; min cueing for w/c propulsion technique for technique with effective within-session carryover. Performed gait x75' in controlled environment with rolling walker and supervision to min guard. Performed sidestepping with rolling walker to enable to pt get into/out of bathroom at home. Pt initially required demonstration cueing and verbal cueing for technique with sidestepping. Pt gave effective return demonstration during sidestepping for length of mat x2. Remainder of session focused on curb negotiation. Pt inquisitive as to why he will need to be able to negotiate standard curb. Explained that pt must be able to negotiate community obstacles as pt reports sister is physically unable to assist. Pt verbalized understanding. Pt performed multiple trials of ascending/descending step (standard curb height) with bilat UE support at parallel bars. Transitioned to negotiation of standard curb with rolling walker and +2A secondary to pt fear of falling. Pt ascended curb backwards, descended forwards with mod A for stability and manual stabilization of rolling walker, verbal cueing to prevent pt pushing walker forward while ascending. Final trial of curb negotiation with rolling walker and min A, manual stabilization of rolling walker, verbal cueing for technique. Explained pt goals, functional expectations, and D/C plan. Pt verbalized understanding and was in full agreement.  Pt left seated in w/c in pt room with all needs within reach.  Treatment Session 2: Pt received seated in w/c; agreeable to therapy. Session focused on community w/c mobility and furniture and car transfers. Pt performed w/c mobility >200' in controlled, home, and community environments including negotiation of standard ramp outside hospital lobby with mod I. Squat pivot from w/c<>simulated car with supervision, cueing for w/c position, arm/leg rest management. In rehab apartment, pt performed squat pivot transfer from w/c<>couch with supervision, cueing for leg rest management with effective within-session carryover of w/c position and arm rest management. Negotiated standard curb (ascending backwards and descending forward) with rolling walker and min A, cueing for slow, controlled movement of rolling walker. Session ended in pt room, where pt was left seated in w/c with all needs within reach.  Therapy Documentation Precautions:  Precautions Precautions: Fall Precaution Comments: educated on precautions, s/p BKA Restrictions Weight Bearing Restrictions: Yes RLE Weight Bearing: Non weight bearing Other Position/Activity Restrictions: elevate operated leg, do not prop knee with pillows,  Pain: Pain Assessment Pain Assessment: No/denies pain Locomotion : Ambulation Ambulation/Gait Assistance: 5: Supervision;4: Min guard Wheelchair Mobility Distance: 200   See FIM for current functional status  Therapy/Group: Individual Therapy  Hobble, Malva Cogan 09/30/2013, 12:28 PM

## 2013-10-01 ENCOUNTER — Inpatient Hospital Stay (HOSPITAL_COMMUNITY): Payer: Medicaid Other | Admitting: Occupational Therapy

## 2013-10-01 ENCOUNTER — Encounter (HOSPITAL_COMMUNITY): Payer: Medicaid Other | Admitting: Occupational Therapy

## 2013-10-01 ENCOUNTER — Inpatient Hospital Stay (HOSPITAL_COMMUNITY): Payer: Medicaid Other | Admitting: *Deleted

## 2013-10-01 DIAGNOSIS — I10 Essential (primary) hypertension: Secondary | ICD-10-CM

## 2013-10-01 DIAGNOSIS — IMO0001 Reserved for inherently not codable concepts without codable children: Secondary | ICD-10-CM

## 2013-10-01 DIAGNOSIS — E1165 Type 2 diabetes mellitus with hyperglycemia: Secondary | ICD-10-CM

## 2013-10-01 DIAGNOSIS — S88119A Complete traumatic amputation at level between knee and ankle, unspecified lower leg, initial encounter: Secondary | ICD-10-CM

## 2013-10-01 LAB — GLUCOSE, CAPILLARY
Glucose-Capillary: 144 mg/dL — ABNORMAL HIGH (ref 70–99)
Glucose-Capillary: 141 mg/dL — ABNORMAL HIGH (ref 70–99)
Glucose-Capillary: 128 mg/dL — ABNORMAL HIGH (ref 70–99)
Glucose-Capillary: 183 mg/dL — ABNORMAL HIGH (ref 70–99)

## 2013-10-01 MED ORDER — INSULIN GLARGINE 100 UNIT/ML ~~LOC~~ SOLN
20.0000 [IU] | Freq: Every day | SUBCUTANEOUS | Status: DC
  Administered 2013-10-01 – 2013-10-02 (×2): 20 [IU] via SUBCUTANEOUS
  Filled 2013-10-01 (×3): qty 0.2

## 2013-10-01 MED ORDER — LISINOPRIL 20 MG PO TABS
20.0000 mg | ORAL_TABLET | Freq: Every day | ORAL | Status: DC
  Administered 2013-10-01 – 2013-10-03 (×3): 20 mg via ORAL
  Filled 2013-10-01 (×4): qty 1

## 2013-10-01 MED ORDER — BISACODYL 10 MG RE SUPP: 10.0000 mg | Freq: Once | RECTAL | Status: DC

## 2013-10-01 MED ORDER — SENNOSIDES-DOCUSATE SODIUM 8.6-50 MG PO TABS
2.0000 | ORAL_TABLET | Freq: Two times a day (BID) | ORAL | Status: DC
  Administered 2013-10-01 – 2013-10-03 (×4): 2 via ORAL
  Filled 2013-10-01 (×7): qty 2

## 2013-10-01 NOTE — Progress Notes (Signed)
Occupational Therapy Session Note  Patient Details  Name: [name] MRN: [id number] Date of Birth: 11-09-1955  Today's Date: 10/01/2013 Time: 0730-0830 and 1430-1530 Time Calculation (min): 60 min and 60 min  Short Term Goals: Week 1:  OT Short Term Goal 1 (Week 1): STG = LTGs due to ELOS  Skilled Therapeutic Interventions/Progress Updates:    1)Engaged in ADL retraining with focus on safety with transfers, sit > stand, and care of residual limb. Completed squat pivot bed > w/c with supervision, verbal cue to remove arm rest prior to transfer. Bathing and dressing completed at sit > stand level at sink with improved standing balance during LB dressing, close supervision for dynamic standing balance this session. Pt propelled w/c to ADL apt mod I, supervision ambulation into bathroom except min guard when sidestepping to Rt out of bathroom door.  Pt completed toilet and tub/shower transfer supervision.  Engaged in [place] with 3 sets of 10 tricep/w/c pushups.  Pt returned to room via w/c mod I.  Educated on w/c parts management with pt having difficulty applying leg rests.  2) Engaged in education regarding care of residual limb, discussing and having pt return demonstrate tapping, massage, desensitization, and inspection of residual limb.  Pt had just received DME in preparation for discharge and had questions regarding follow up RN and therapies, informed SW.  Pt doffed and donned shrinker with education on care of shrinker.  Propelled w/c to ADL apartment, educated on safety with simple meal prep and accessing items in kitchen from w/c level.  Pt with increased problem solving of w/c placement and parts management for sit > stand to obtain items from cabinets.  UBE for five minutes forward and 5 mins backward with focus on UE endurance as needed for w/c propulsion.    Therapy Documentation Precautions:  Precautions Precautions: Fall Precaution Comments: educated on precautions,  s/p BKA Restrictions Weight Bearing Restrictions: Yes RLE Weight Bearing: Non weight bearing Other Position/Activity Restrictions: elevate operated leg, do not prop knee with pillows,  Pain: Pain Assessment Pain Assessment: 0-10 Pain Score: 5  Pain Type: Acute pain Pain Location: Abdomen Pain Orientation: Anterior;Mid Pain Descriptors / Indicators: Pressure Pain Onset: On-going Pain Intervention(s): RN made aware;Repositioned;Ambulation/increased activity Multiple Pain Sites: No  See FIM for current functional status  Therapy/Group: Individual Therapy  HOXIE, Salmon 10/01/2013, 10:00 AM

## 2013-10-01 NOTE — Progress Notes (Signed)
Physical Therapy Session Note  Patient Details  Name: [name] MRN: [id number] Date of Birth: 12-23-1955  Today's Date: 10/01/2013 Time: 0900-1000 Time Calculation (min): 60 min  Short Term Goals: Week 1:  PT Short Term Goal 1 (Week 1): STGs=LTGs due to ELOS  Skilled Therapeutic Interventions/Progress Updates:    Patient received sitting in wheelchair. Session focused on functional and furniture transfers, wheelchair mobility, and functional ambulation in various environments. Patient performed wheelchair mobility 160' x1 in controlled environment, 52' x1 in home environment (on carpet in ADL apartment), and various short distances 40-75' between gyms with mod I. Furniture transfer with supervision, cues for proper positioning of wheelchair close enough to sofa. Ramp negotiation x3 trials in wheelchair with mod I, ascends backwards, descends forwards. First trial ascends forwards and descends backwards, but requires minA for safety.   Functional ambulation in controlled environment 90' x1, cues for appropriate pacing, and 35' x1 in home environment (on carpet in ADL apartment), and side stepping along mat in gym 8' in each direction, and side/backwards stepping in ADL apartment with RW and supervision. Curb negotiation x2 trials with minA, ascends backwards, descends forwards, cues for appropriate sequencing with RW. Wheelchair push ups 2x10.  Discussion/education with patient about primarily using wheelchair for mobility and ambulation for accessing bathroom; Additionally, discussion about energy conservation and use of transportation in the community ([employer], etc.). Patient returned to room and left sitting in wheelchair with all needs within reach.  Therapy Documentation Precautions:  Precautions Precautions: Fall Precaution Comments: educated on precautions, s/p BKA Restrictions Weight Bearing Restrictions: Yes RLE Weight Bearing: Non weight bearing Other Position/Activity  Restrictions: elevate operated leg, do not prop knee with pillows,  Pain: Pain Assessment Pain Assessment: 0-10 Pain Score: 5  Pain Type: Acute pain Pain Location: Abdomen Pain Orientation: Anterior;Mid Pain Descriptors / Indicators: Pressure Pain Onset: On-going Pain Intervention(s): RN made aware;Repositioned;Ambulation/increased activity Multiple Pain Sites: No Locomotion : Ambulation Ambulation/Gait Assistance: 5: Supervision Wheelchair Mobility Distance: 200   See FIM for current functional status  Therapy/Group: Individual Therapy  Lillia Abed. Ripa, PT, DPT 10/01/2013, 12:04 PM

## 2013-10-01 NOTE — IPOC Note (Signed)
Overall Plan of Care [facility]) Patient Details Name: [name] MRN: [id number] DOB: Dec 02, 1955  Admitting Diagnosis: R BKA  Hospital Problems: Active Problems:   Hx of BKA     Functional Problem List: Nursing Pain;Skin Integrity  PT Balance;Edema;Endurance;Motor;Pain;Safety;Sensory;Skin Integrity  OT Balance;Endurance;Pain;Skin Integrity  SLP    TR         Basic ADL's: OT Grooming;Bathing;Dressing;Toileting     Advanced  ADL's: OT Simple Meal Preparation     Transfers: PT Bed Mobility;Bed to Chair;Car;Furniture  OT Toilet;Tub/Shower     Locomotion: PT Ambulation;Wheelchair Mobility;Stairs     Additional Impairments: OT None  SLP        TR      Anticipated Outcomes Item Anticipated Outcome  Self Feeding independent  Swallowing      Basic self-care  mod I  Toileting  mod I   Bathroom Transfers mod I  Bowel/Bladder  Mod I  Transfers  mod I  Locomotion  mod I  Communication     Cognition     Pain  <4  Safety/Judgment  Supervision   Therapy Plan: PT Intensity: Minimum of 1-2 x/day ,45 to 90 minutes PT Frequency: 5 out of 7 days PT Duration Estimated Length of Stay: 5-7 days OT Intensity: Minimum of 1-2 x/day, 45 to 90 minutes OT Frequency: 5 out of 7 days OT Duration/Estimated Length of Stay: 5-7 days         Team Interventions: Nursing Interventions Patient/Family Education;Disease Management/Prevention;Pain Management;Skin Care/Wound Management  PT interventions Ambulation/gait training;Disease management/prevention;Pain management;Stair training;Wheelchair propulsion/positioning;Therapeutic Activities;Patient/family education;DME/adaptive equipment instruction;Balance/vestibular training;Cognitive remediation/compensation;Psychosocial support;Therapeutic Exercise;UE/LE Strength taining/ROM;Community reintegration;Functional mobility training;Skin care/wound management;UE/LE Coordination activities;Splinting/orthotics;Neuromuscular  re-education;Discharge planning  OT Interventions Balance/vestibular training;Discharge planning;Disease mangement/prevention;DME/adaptive equipment instruction;Functional mobility training;Pain management;Patient/family education;Psychosocial support;Self Care/advanced ADL retraining;Skin care/wound managment;Therapeutic Activities;Therapeutic Exercise;UE/LE Strength taining/ROM  SLP Interventions    TR Interventions    SW/CM Interventions Discharge Planning;Psychosocial Support;Patient/Family Education    Team Discharge Planning: Destination: PT-Home ,OT- Home , SLP-  Projected Follow-up: PT-Home health PT;Outpatient PT, OT-  None, SLP-  Projected Equipment Needs: PT-To be determined;Wheelchair (measurements);Wheelchair cushion (measurements);Rolling walker with 5" wheels, OT- Tub/shower bench, SLP-  Equipment Details: PT-Patient does not own any DME; recommendations TBD upon discharge, OT-  Patient/family involved in discharge planning: PT- Patient,  OT-Patient, SLP-   MD ELOS: 5-7 d Medical Rehab Prognosis:  Excellent Assessment: 58 y.o. right-handed male with history of hypertension, schizophrenia, diabetes mellitus with peripheral neuropathy and right lower extremity cellulitis. Patient lives alone independent prior to admission and sedentary. Presented 09/19/2013 with right foot also and soft tissue infection. MRI of right foot showed abnormal diffuse subcutaneous and muscular edema consistent with cellulitis. Patient underwent extensive debridement 09/24/2013  Now requiring 24/7 Rehab RN,MD, as well as CIR level PT, OT and SLP.  Treatment team will focus on ADLs and mobility with goals set at [place]    See Team Conference Notes for weekly updates to the plan of care

## 2013-10-01 NOTE — Progress Notes (Signed)
Subjective/Complaints: 58 y.o. right-handed male with history of hypertension, schizophrenia, diabetes mellitus with peripheral neuropathy and right lower extremity cellulitis. Patient lives alone independent prior to admission and sedentary. Presented 09/19/2013 with right foot also and soft tissue infection. MRI of right foot showed abnormal diffuse subcutaneous and muscular edema consistent with cellulitis. Patient underwent extensive debridement 09/24/2013 per Dr. Percell Miller and placed on antibiotic therapy. 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 Marcelino Scot as well as repeat debridement of wound   No Phantom pains, stump pain controlled Discussed D/C date  +constipation , neg abd pain, - N/V  Objective: Vital Signs: Blood pressure 155/76, pulse 70, temperature 98.2 F (36.8 C), temperature source Oral, resp. rate 18, height 6' (1.829 m), weight 94.2 kg (207 lb 10.8 oz), SpO2 97.00%. No results found. Results for orders placed during the hospital encounter of 09/28/13 (from the past 72 hour(s))  GLUCOSE, CAPILLARY     Status: None   Collection Time    09/28/13  5:00 PM      Result Value Ref Range   Glucose-Capillary 79  70 - 99 mg/dL  CBC WITH DIFFERENTIAL     Status: Abnormal   Collection Time    09/29/13  5:45 AM      Result Value Ref Range   WBC 15.0 (*) 4.0 - 10.5 K/uL   RBC 3.35 (*) 4.22 - 5.81 MIL/uL   Hemoglobin 8.8 (*) 13.0 - 17.0 g/dL   HCT 27.9 (*) 39.0 - 52.0 %   MCV 83.3  78.0 - 100.0 fL   MCH 26.3  26.0 - 34.0 pg   MCHC 31.5  30.0 - 36.0 g/dL   RDW 12.8  11.5 - 15.5 %   Platelets 534 (*) 150 - 400 K/uL   Neutrophils Relative % 79 (*) 43 - 77 %   Neutro Abs 11.7 (*) 1.7 - 7.7 K/uL   Lymphocytes Relative 13  12 - 46 %   Lymphs Abs 2.0  0.7 - 4.0 K/uL   Monocytes Relative 8  3 - 12 %   Monocytes Absolute 1.2 (*) 0.1 - 1.0 K/uL   Eosinophils Relative 0  0 - 5 %   Eosinophils Absolute 0.1  0.0 - 0.7 K/uL   Basophils Relative 0  0 - 1 %   Basophils Absolute 0.0  0.0 - 0.1 K/uL  COMPREHENSIVE METABOLIC PANEL     Status: Abnormal   Collection Time    09/29/13  5:45 AM      Result Value Ref Range   Sodium 141  137 - 147 mEq/L   Potassium 4.4  3.7 - 5.3 mEq/L   Chloride 102  96 - 112 mEq/L   CO2 28  19 - 32 mEq/L   Glucose, Bld 97  70 - 99 mg/dL   BUN 10  6 - 23 mg/dL   Creatinine, Ser 1.06  0.50 - 1.35 mg/dL   Calcium 8.7  8.4 - 10.5 mg/dL   Total Protein 7.2  6.0 - 8.3 g/dL   Albumin 2.1 (*) 3.5 - 5.2 g/dL   AST 85 (*) 0 - 37 U/L   ALT 65 (*) 0 - 53 U/L   Alkaline Phosphatase 103  39 - 117 U/L   Total Bilirubin 0.3  0.3 - 1.2 mg/dL   GFR calc non Af Amer 77 (*) >90 mL/min   GFR calc Af Amer 89 (*) >90 mL/min   Comment: (NOTE)     The eGFR has  been calculated using the CKD EPI equation.     This calculation has not been validated in all clinical situations.     eGFR's persistently <90 mL/min signify possible Chronic Kidney     Disease.   Anion gap 11  5 - 15  GLUCOSE, CAPILLARY     Status: None   Collection Time    09/29/13  7:09 AM      Result Value Ref Range   Glucose-Capillary 88  70 - 99 mg/dL   Comment 1 Notify RN    GLUCOSE, CAPILLARY     Status: Abnormal   Collection Time    09/29/13 11:06 AM      Result Value Ref Range   Glucose-Capillary 107 (*) 70 - 99 mg/dL   Comment 1 Notify RN    GLUCOSE, CAPILLARY     Status: Abnormal   Collection Time    09/29/13  4:39 PM      Result Value Ref Range   Glucose-Capillary 137 (*) 70 - 99 mg/dL   Comment 1 Notify RN     Comment 2 Documented in Chart    GLUCOSE, CAPILLARY     Status: Abnormal   Collection Time    09/29/13  9:06 PM      Result Value Ref Range   Glucose-Capillary 140 (*) 70 - 99 mg/dL  GLUCOSE, CAPILLARY     Status: Abnormal   Collection Time    09/30/13  7:18 AM      Result Value Ref Range   Glucose-Capillary 119 (*) 70 - 99 mg/dL   Comment 1 Notify RN    GLUCOSE, CAPILLARY     Status: Abnormal    Collection Time    09/30/13 11:12 AM      Result Value Ref Range   Glucose-Capillary 190 (*) 70 - 99 mg/dL   Comment 1 Notify RN    GLUCOSE, CAPILLARY     Status: Abnormal   Collection Time    09/30/13  4:38 PM      Result Value Ref Range   Glucose-Capillary 115 (*) 70 - 99 mg/dL  GLUCOSE, CAPILLARY     Status: Abnormal   Collection Time    09/30/13  8:57 PM      Result Value Ref Range   Glucose-Capillary 199 (*) 70 - 99 mg/dL     HEENT: normal Cardio: RRR and no murmur Resp: CTA B/L and unlabored GI: BS positive and NT,ND Extremity:  Edema distal stump Skin:   Other R BKA with stump shrinker Neuro: Alert/Oriented, Abnormal Sensory absent LT in Left foot and Abnormal Motor 4/5 in Left HF, KE ANkle DF Musc/Skel:  Normal and Other Right mid tib BKA Gen NAD   Assessment/Plan: 1. Functional deficits secondary to Right BKA which require 3+ hours per day of interdisciplinary therapy in a comprehensive inpatient rehab setting. Physiatrist is providing close team supervision and 24 hour management of active medical problems listed below. Physiatrist and rehab team continue to assess barriers to discharge/monitor patient progress toward functional and medical goals. FIM: FIM - Bathing Bathing Steps Patient Completed: Chest;Right Arm;Left Arm;Abdomen;Front perineal area;Buttocks;Right upper leg;Left upper leg Bathing: 4: Min-Patient completes 8-9 70f10 parts or 75+ percent (sit > stand)  FIM - Upper Body Dressing/Undressing Upper body dressing/undressing steps patient completed: Thread/unthread right sleeve of pullover shirt/dresss;Thread/unthread left sleeve of pullover shirt/dress;Put head through opening of pull over shirt/dress;Pull shirt over trunk Upper body dressing/undressing: 7: Complete Independence: No helper FIM - Lower Body Dressing/Undressing Lower  body dressing/undressing steps patient completed: Thread/unthread right underwear leg;Thread/unthread left underwear leg;Pull  underwear up/down;Thread/unthread right pants leg;Thread/unthread left pants leg;Pull pants up/down;Don/Doff left shoe Lower body dressing/undressing: 4: Steadying Assist (sit > stand)     FIM - Radio producer Devices: Bedside commode Toilet Transfers: 2-To toilet/BSC: Max A (lift and lower assist);2-From toilet/BSC: Max A (lift and lower assist)  FIM - Engineer, site Assistive Devices: Arm rests;Walker Bed/Chair Transfer: 5: Bed > Chair or W/C: Supervision (verbal cues/safety issues);5: Chair or W/C > Bed: Supervision (verbal cues/safety issues)  FIM - Locomotion: Wheelchair Distance: 200 Locomotion: Wheelchair: 6: Travels 150 ft or more, turns around, maneuvers to table, bed or toilet, negotiates 3% grade: maneuvers on rugs and over door sills independently FIM - Locomotion: Ambulation Ambulation/Gait Assistance: 5: Supervision;4: Min guard Locomotion: Ambulation: 0: Activity did not occur  Comprehension Comprehension Mode: Auditory Comprehension: 5-Follows basic conversation/direction: With no assist  Expression Expression Mode: Verbal Expression: 4-Expresses basic 75 - 89% of the time/requires cueing 10 - 24% of the time. Needs helper to occlude trach/needs to repeat words.  Social Interaction Social Interaction: 4-Interacts appropriately 75 - 89% of the time - Needs redirection for appropriate language or to initiate interaction.  Problem Solving Problem Solving: 4-Solves basic 75 - 89% of the time/requires cueing 10 - 24% of the time  Memory Memory: 5-Recognizes or recalls 90% of the time/requires cueing < 10% of the time  Medical Problem List and Plan:  1. Functional deficits secondary to right BKA 09/26/2013 secondary to progressive ischemia. Stump shrinker ordered per [facility].  2. DVT Prophylaxis/Anticoagulation: Subcutaneous heparin. Monitor platelet counts and any signs of bleeding  3. Pain Management:  Neurontin 300 mg 3 times a day, oxycodone as needed. Monitor with increased mobility  4. Mood/schizophrenia: Patient appears at baseline  5. Neuropsych: This patient is capable of making decisions on his own behalf.  6. Skin/Wound Care: Routine skin care. Monitor right below-knee amputation site.  7. Acute blood loss anemia. Followup CBC  8. Hypertension. Lisinopril 20 mg daily. Monitor with increased mobility  9. Diabetes mellitus with peripheral neuropathy. Latest hemoglobin A1c of 14. Lantus insulin 30 units each bedtime. Check blood sugars a.c. and at bedtime. Provide diabetic teaching  10.  Constipation adjust laxatives  LOS (Days) 3 A FACE TO FACE EVALUATION WAS PERFORMED  KIRSTEINS,ANDREW E 10/01/2013, 6:41 AM

## 2013-10-01 NOTE — Discharge Summary (Signed)
NAMEMarland Tucker  KAIPO, [name]:  [id number]  MEDICAL RECORD NO.:  [id number]  LOCATION:  4M07C                        FACILITY:  [facility]  PHYSICIAN:  Lauraine Rinne, P.A.  DATE OF BIRTH:  10-14-1955  DATE OF ADMISSION:  09/28/2013 DATE OF DISCHARGE:  10/03/2013                              DISCHARGE SUMMARY   DISCHARGE DIAGNOSES: 1. Functional deficits secondary to right below-knee amputation     secondary to progressive ischemia on September 26, 2013. 2. Subcutaneous heparin for DVT prophylaxis. 3. Pain management. 4. Mood with schizophrenia. 5. Acute blood loss anemia. 6. Hypertension. 7. Diabetes mellitus with peripheral neuropathy.  HISTORY OF PRESENT ILLNESS:  This is a 58 year old right-handed male with history of hypertension, schizophrenia, as well as diabetes mellitus, peripheral neuropathy, and right lower extremity cellulitis. The patient lives alone independent prior to admission.  Presented on September 19, 2013, with right foot soft tissue infection.  MRI of the foot showed abnormal diffuse subcutaneous and muscular edema consistent with cellulitis.  Underwent extensive debridement on September 24, 2013, per Dr. Percell Miller and placed on antibiotic therapy.  Wound with progressive ischemic changes, underwent right below-knee amputation on September 26, 2013, per Dr. Marcelino Scot as well as repeat debridement of wound.  Hospital course, pain management.  Stump shrinker ordered per [facility].  Subcutaneous heparin for DVT prophylaxis.  Acute blood loss anemia 9.3 and monitored.  Physical and occupational therapy ongoing.  The patient was admitted for comprehensive rehab program.  PAST MEDICAL HISTORY:  See discharge diagnoses.  SOCIAL HISTORY:  Lives alone.  FUNCTIONAL HISTORY:  Prior to admission independent.  Functional status upon admission to rehab services was minimal assist for stand and pivot transfers, modified independent overall bed mobility, min to mod  assist activities of daily living.  PHYSICAL EXAMINATION:  VITAL SIGNS:  Blood pressure 147/91, pulse 73, temperature 99 respirations 14. GENERAL:  This was an alert male, no acute distress, oriented x3. Cooperative during exam. LUNGS:  Clear to auscultation. CARDIAC:  Regular rate and rhythm. ABDOMEN:  Soft, nontender.  Good bowel sounds. EXTREMITIES:  Right below-knee amputation site was dressed, appropriately tender.  There was a mild amount of drainage from the incision that was nonodorous.  REHABILITATION HOSPITAL COURSE:  Patient was admitted to inpatient rehab services with therapies initiated on a 3-hour daily basis consisting of physical therapy, occupational therapy, and rehabilitation nursing.  The following issues were addressed during the patient's rehabilitation stay.  Pertaining to [name] right below-knee amputation on September 26, 2013, secondary to progressive ischemic changes, continued to heal nicely.  A stump shrinker had been ordered per [facility].  He would follow up with [facility], Dr. Marcelino Scot.  Subcutaneous heparin throughout his rehab course for DVT prophylaxis.  No bleeding episodes.  Pain management with the use of Neurontin 300 mg t.i.d. as well as oxycodone for breakthrough pain and good results.  The patient did have a history of schizophrenia.  He was at his baseline.  He was cooperative with staff, resting well at night.  Acute blood loss anemia. No bleeding episodes noted.  Latest hemoglobin of 9.0.  No chest pain or shortness of breath noted in relation to  his anemia.  Blood pressures remained well controlled.  He would follow up with his primary MD.  The patient had been on lisinopril prior to hospital admission.  Diabetes mellitus and peripheral neuropathy, poorly controlled.  Hemoglobin A1c of 14.  Full diabetic teaching completed.  Blood sugars had normalized nicely.  The patient received weekly collaborative  interdisciplinary team conferences to discuss estimated length of stay, family teaching, and any barriers to discharge.  Sessions focused on side stepping and curb negotiations.  Performed wheelchair mobility in a controlled environment with supervision.  Ambulating 75 feet controlled environment with rolling walker and supervision to minimal guard.  Performed side stepping with rolling walker, to enable patient to get into and out of bathroom at home.  Sessions focused on curb negotiations.  The patient performed multiple trials of ascending and descending steps, activities of daily living.  Educated patient on proper donning and plan for ongoing needs for stump shrinker.  Completed squat pivot, bed-to- wheelchair transfer supervision.  Bathing, dressing completed with sit to stand level with minimal steady assist.  Full family teaching was completed and plan was to be discharged to home with ongoing home health physical and occupational therapy.  DISCHARGE MEDICATIONS:  At the time of dictation included Alphagan ophthalmic solution 1 drop left eye b.i.d., Cosopt 1 drop left eye b.i.d., Neurontin 300 mg p.o. t.i.d., oxycodone immediate release 5-20 mg every 4 hours as needed moderate pain and dispense of #90 tablets, Lantus insulin 20 units at bedtime, lisinopril 20 mg daily Protonix 40 mg p.o. daily.  DIET:  Diabetic diet.  SPECIAL INSTRUCTIONS:  The patient would follow up Dr. Alysia Penna at the outpatient rehab center as directed; Dr. Altamese [facility] 2 weeks call for appointment.  Patient would follow up with family medicine [facility].  Home health physical and occupational therapies arranged.     Lauraine Rinne, P.A.     DA/MEDQ  D:  10/01/2013  T:  10/01/2013  Job:  [id number]  cc:   Astrid Divine. Marcelino Scot, M.D. Charlett Blake, M.D. Edmonia Lynch, MD Tawanna Sat, MD

## 2013-10-01 NOTE — Discharge Summary (Signed)
Discharge summary job [id number]

## 2013-10-02 ENCOUNTER — Inpatient Hospital Stay (HOSPITAL_COMMUNITY): Payer: Medicaid Other | Admitting: Physical Therapy

## 2013-10-02 ENCOUNTER — Encounter (HOSPITAL_COMMUNITY): Payer: Medicaid Other | Admitting: Occupational Therapy

## 2013-10-02 DIAGNOSIS — S88119A Complete traumatic amputation at level between knee and ankle, unspecified lower leg, initial encounter: Secondary | ICD-10-CM

## 2013-10-02 DIAGNOSIS — E1165 Type 2 diabetes mellitus with hyperglycemia: Secondary | ICD-10-CM

## 2013-10-02 DIAGNOSIS — I10 Essential (primary) hypertension: Secondary | ICD-10-CM

## 2013-10-02 DIAGNOSIS — IMO0001 Reserved for inherently not codable concepts without codable children: Secondary | ICD-10-CM

## 2013-10-02 LAB — GLUCOSE, CAPILLARY
Glucose-Capillary: 122 mg/dL — ABNORMAL HIGH (ref 70–99)
Glucose-Capillary: 114 mg/dL — ABNORMAL HIGH (ref 70–99)
Glucose-Capillary: 189 mg/dL — ABNORMAL HIGH (ref 70–99)
Glucose-Capillary: 181 mg/dL — ABNORMAL HIGH (ref 70–99)

## 2013-10-02 MED ORDER — SENNOSIDES-DOCUSATE SODIUM 8.6-50 MG PO TABS: 2.0000 | ORAL_TABLET | Freq: Two times a day (BID) | ORAL | Status: DC

## 2013-10-02 MED ORDER — OXYCODONE HCL 5 MG PO TABS: 5.0000 mg | ORAL_TABLET | Freq: Four times a day (QID) | ORAL | Status: DC | PRN

## 2013-10-02 MED ORDER — LISINOPRIL 40 MG PO TABS: 20.0000 mg | ORAL_TABLET | Freq: Every day | ORAL | Status: DC

## 2013-10-02 MED ORDER — GABAPENTIN 300 MG PO CAPS: 300.0000 mg | ORAL_CAPSULE | Freq: Three times a day (TID) | ORAL | Status: DC

## 2013-10-02 NOTE — Progress Notes (Signed)
Occupational Therapy Discharge Summary  Patient Details  Name: [name] MRN: [id number] Date of Birth: 10/29/1955  Today's Date: 10/02/2013  Patient has met 10 of 10 long term goals due to improved activity tolerance, improved balance, postural control and ability to compensate for deficits.  Patient to discharge at overall Modified Independent level, primarily w/c level with exception of RW in bathroom as it is not accessible via w/c.  Educated pt on alternating UE support during dynamic standing balance as pt is not mod I when he attempts tasks without UE support, pt able to return demonstrate understanding of use of UE support during all standing tasks. Patient's care partner is independent to provide the necessary assistance at discharge, pt's sister is able to provide intermittent assist.    Reasons goals not met: N/A  Recommendation:  Patient will not require follow up OT at this time.  Equipment: tub bench and BSC  Reasons for discharge: treatment goals met and discharge from hospital  Patient/family agrees with progress made and goals achieved: Yes  OT Discharge Precautions/Restrictions  Precautions Precautions: Fall Precaution Comments: educated on precautions, s/p BKA Restrictions Weight Bearing Restrictions: Yes RLE Weight Bearing: Non weight bearing Other Position/Activity Restrictions: elevate operated leg, do not prop knee with pillows,  Pain Pain Assessment Pain Assessment: No/denies pain ADL  See FIM Vision/Perception  Vision- History Baseline Vision/History: Wears glasses (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 Pt describes hypersensitivity on posterolateral aspect of R residual llimb (distal to R knee) Coordination Gross Motor Movements are Fluid and Coordinated: Yes Fine Motor Movements are Fluid and Coordinated: Yes Motor  Motor Motor: Within Functional Limits Mobility  Bed Mobility Bed Mobility: Supine to Sit;Sit to Supine;Rolling Right;Right Sidelying to Sit Rolling Right: 7: Independent Right Sidelying to Sit: 7: Independent;HOB flat Supine to Sit: 7: Independent;HOB flat Sit to Supine: HOB flat Transfers Sit to Stand: 6: Modified independent (Device/Increase time);From chair/3-in-1;With armrests;Other (comment) (using rolling walker) Stand to Sit: 6: Modified independent (Device/Increase time);To chair/3-in-1;To bed;With armrests;Other (comment) (using rolling walker)  Trunk/Postural Assessment  Cervical Assessment Cervical Assessment: Within Functional Limits Thoracic Assessment Thoracic Assessment: Within Functional Limits Lumbar Assessment Lumbar Assessment: Within Functional Limits Postural Control Postural Control: Within Functional Limits  Balance Balance Balance Assessed: Yes Static Sitting Balance Static Sitting - Balance Support: No upper extremity supported;Feet supported Static Sitting - Level of Assistance: 7: Independent Static Standing Balance Static Standing - Balance Support: Bilateral upper extremity supported;During functional activity Dynamic Standing Balance Dynamic Standing - Balance Support: During functional activity;Bilateral upper extremity supported Dynamic Standing - Level of Assistance: 6: Modified independent (Device/Increase time) Dynamic Standing - Comments: Pt Mod I with dynamic stannding balance during  functional mobility activities (side stepping) with single or bilat UE support. Extremity/Trunk Assessment RUE Assessment RUE Assessment: Within Functional Limits (grossly 5/5) LUE Assessment LUE Assessment: Within Functional Limits (grossly 5/5)  See FIM for current functional status  HOXIE, [facility] 10/02/2013, 3:44 PM

## 2013-10-02 NOTE — Plan of Care (Signed)
Problem: RH Balance Goal: LTG Patient will maintain dynamic standing balance (PT) LTG: Patient will maintain dynamic standing balance with assistance during mobility activities (PT)  Outcome: Completed/Met Date Met:  10/02/13 Pt mod I during functional mobility activities with single or bilat UE support. Pt verbalized understanding that he will require supervision of home health PT when performing dynamic standing balance without UE support.

## 2013-10-02 NOTE — Discharge Instructions (Signed)
Inpatient Rehab Discharge Instructions  KERRIGAN GLENDENING Discharge date and time: 10/03/13   Activities/Precautions/ Functional Status: Activity: activity as tolerated Diet: diabetic diet Wound Care: keep wound clean and dry Functional status:  ___ No restrictions     ___ Walk up steps independently ___ 24/7 supervision/assistance   ___ Walk up steps with assistance _X__ Intermittent supervision/assistance  ___ Bathe/dress independently ___ Walk with walker     ___ Bathe/dress with assistance ___ Walk Independently    ___ Shower independently ___ Walk with assistance    ___ Shower with assistance _X__ No alcohol     ___ Return to work/school ________  Special Instructions: 1.Followup 2 weeks Dr. Marcelino Scot for removal of sutures   COMMUNITY REFERRALS UPON DISCHARGE:    Home Health:   PT, Holliday SPQZR:[phone] Date of last service:10/03/2013  Medical Equipment/Items Ordered:WHEELCHAIR, [place], [place], [place]    [phone] Other:SCAT APPLICATION  GENERAL COMMUNITY RESOURCES FOR PATIENT/FAMILY: Support [place]  My questions have been answered and I understand these instructions. I will adhere to these goals and the provided educational materials after my discharge from the hospital.  Patient/Caregiver Signature _______________________________ Date __________  Clinician Signature _______________________________________ Date __________  Please bring this form and your medication list with you to all your follow-up doctor's appointments.

## 2013-10-02 NOTE — Plan of Care (Signed)
Problem: RH Balance Goal: LTG Patient will maintain dynamic standing with ADLs (OT) LTG: Patient will maintain dynamic standing balance with assist during activities of daily living (OT)  Outcome: Completed/Met Date Met:  10/02/13 Pt requires 1 UE support to remain mod I during self-care tasks.  Educated on alternating UE support while completing hygiene and pulling up pants to ensure mod I with dynamic standing balance during ADLs.

## 2013-10-02 NOTE — Plan of Care (Signed)
Problem: RH Bathing Goal: LTG Patient will bathe with assist, cues/equipment (OT) LTG: Patient will bathe specified number of body parts with assist with/without cues using equipment (position) (OT)  Outcome: Completed/Met Date Met:  10/02/13 Pt is mod I with lateral leans for bathing at shower level (when approved by MD), not recommending standing in shower at this time, however pt is mod I with bathing at sit > stand level at sink.

## 2013-10-02 NOTE — Progress Notes (Signed)
Physical Therapy Discharge Summary  Patient Details  Name: [name] MRN: [id number] Date of Birth: 26-Apr-1955  Date: 10/02/2013 Time: 1105-1205 and 3149-7026 Time Calculation (min): 60 min and 63 min  Date: 10/03/2013 Time: 1000-1008 Time Calculation (min): 8 min  Patient has met 11 of 12 long term goals due to improved balance, improved postural control, decreased pain and ability to compensate for deficits.  Patient to discharge at a wheelchair level and ambulatory for household distances Modified Independent.   Patient's care partner (sister) will only need to provide assistance with curb negotiation and supervision with car transfer at discharge, as patient is mod I with all other aspects of functional mobility. Caregiver is independent to provide the necessary physical assistance at discharge.  Reasons goals not met: Long term goal addressing negotiation of single curb  Recommendation:  Patient will benefit from ongoing skilled PT services in home health setting to continue to advance safe functional mobility, address ongoing impairments in stability/independence with functional mobility and to minimize fall risk.  Equipment: wheelchair [phone] and cushion; rolling walker  Reasons for discharge: treatment goals met and discharge from hospital  Patient/family agrees with progress made and goals achieved: Yes  Skilled Therapeutic Interventions/Progress Updates: 10/02/13; Treatment Session 1: Pt received seated in w/c; agreeable to therapy. Session focused on assessing/addressing functional mobility/transfers n home environment. See discharge evaluation below for detailed description of bed mobility, w/c mobility, functional transfers, and gait. Pt performed w/c mobility >300' in controlled and home environments with bilat UE's and mod I. In rehab apartment, pt performed squat pivot transfers from w/c<>bed<>w/c<>couch with mod I, increased time. Pt safely/effectively managed w/c  parts and set up all transfers without assist/cueing. Pt performed gait x80' in controlled and home environments with rolling walker and mod I, increased time. Per pt questions, education addressed positioning of R residual limb in seated and therapeutic exercises to maintain R knee extension ROM. Pt verbalized understanding and gave effective return demonstration. Session ended in pt room, where pt was left seated in w/c with all needs within reach.  10/02/13; Treatment Session 2: Pt received seated in w/c; agreeable to therapy. Session focused on sidestepping, curb negotiation, and education on w/c breakdown. Pt performed L sidestepping with rolling walker and mod I. Recommended that pt sidestep to L side to enter home bathroom, as pt does require supervision at times to sidestep to R side. Pt verbalized understanding.   Negotiated standard curb x2 trials with rolling walker, ascending backwards and descending forward with min A to stabilize walker when transitioning from floor to/from curb. Therapist explained, demonstrated x2 trials how a second person (sister) would bump pt's w/c up/down a curb; initial demonstration performed with pt seated in w/c and subsequent trial with pt seated on mat table. Pt then asked to explain sequence of bumping w/c up/down curb while this PT performed each step. Pt able to recall and correctly sequence ~50% of steps correctly during initial attempt and ~75% during second attempt. Verbally recommended that pt's sister/family member be trained in curb negotiation prior to D/C home tomorrow. Pt verbalized understanding and was in agreement. RN notified. Paper handout provided for curb negotiation in w/c. Explained, demonstrated breakdown of w/c. Pt then effectively verbalized all steps to break down w/c. Session ended in pt room, where pt was left seated in w/c with all needs within reach. Pt made modified independent in room. RN notified.   10/03/13: Session focused on hands-on  family training. Explained, demonstrated, and provided handout explaining  safe technique for bumping w/c up and down standard curb with Total A. Sister verbalized understanding and gave effective return demonstration. Pt and sister express feeling safe and comfortable providing curb negotiation at discharge. Pt left seated in w/c in pt room with sister present and all needs within reach.  PT Discharge Precautions/Restrictions Precautions Precautions: Fall Precaution Comments: educated on precautions, s/p BKA Restrictions Weight Bearing Restrictions: Yes RLE Weight Bearing: Non weight bearing Other Position/Activity Restrictions: elevate operated leg, do not prop knee with pillows,  Vital Signs Therapy Vitals Temp: 98.6 F (37 C) Temp src: Oral Pulse Rate: 58 Resp: 17 BP: 153/83 mmHg Patient Position (if appropriate): Sitting Oxygen Therapy SpO2: 97 % O2 Device: None (Room air) Pain Pain Assessment Pain Assessment: No/denies pain Cognition Overall Cognitive Status: History of cognitive impairments - at baseline Arousal/Alertness: Awake/alert Orientation Level: Oriented X4 Memory: Appears intact Problem Solving: Appears intact Safety/Judgment: Appears intact Sensation Sensation Light Touch: Impaired Detail Light Touch Impaired Details: Impaired RLE Proprioception: Appears Intact Additional Comments: Impaired sensation R LE on lateral aspect of residual limb. Pt describes hypersensitivity on posterolateral aspect of R residual llimb (distal to R knee) Coordination Gross Motor Movements are Fluid and Coordinated: Yes Fine Motor Movements are Fluid and Coordinated: Yes Motor  Motor Motor: Within Functional Limits  Mobility Bed Mobility Bed Mobility: Supine to Sit;Sit to Supine;Rolling Right;Right Sidelying to Sit Rolling Right: 7: Independent Right Sidelying to Sit: 7: Independent;HOB flat Supine to Sit: 7: Independent;HOB flat Sit to Supine: HOB  flat Transfers Transfers: Yes Sit to Stand: 6: Modified independent (Device/Increase time);From chair/3-in-1;With armrests;Other (comment) (using rolling walker) Stand to Sit: 6: Modified independent (Device/Increase time);To chair/3-in-1;To bed;With armrests;Other (comment) (using rolling walker) Stand Pivot Transfers: With armrests;6: Modified independent (Device/Increase time);Other (comment) (using rolling walker) Squat Pivot Transfers: 6: Modified independent (Device/Increase time) Locomotion  Ambulation Ambulation: Yes Assistive device: Rolling walker Gait Gait Pattern: Impaired Gait Pattern: Step-to pattern;Decreased step length - left;Trunk flexed Gait velocity: Self-selected gait speed= .26 m/s. High Level Ambulation High Level Ambulation: Side stepping Side Stepping: Mod I using rolling walker when side stepping to L side (to simulate home bathroom) Stairs / Additional Locomotion Stairs: Yes Stairs Assistance: 4: Min assist;4: Min guard Stairs Assistance Details: Verbal cues for precautions/safety;Verbal cues for technique Stairs Assistance Details (indicate cue type and reason): Pt ascends backwards, descends forward with rolling walker requiring min guard-min A, verbal cueing for technique, safety with lifting rolling walker onto and off of curb. Stair Management Technique: Step to pattern;Forwards;Backwards;With walker Number of Stairs: 1 Ramp: 6: Modified independent (Device);Not tested (comment) (using wheelchair) Curb: 4: Min Chemical engineer: Yes Wheelchair Assistance: 6: Modified independent (Device/Increase time) Environmental health practitioner: Both upper extremities;Left lower extremity Wheelchair Parts Management: Independent  Trunk/Postural Assessment  Cervical Assessment Cervical Assessment: Within Functional Limits Thoracic Assessment Thoracic Assessment: Within Functional Limits Lumbar Assessment Lumbar Assessment: Within Functional  Limits Postural Control Postural Control: Within Functional Limits  Balance Balance Balance Assessed: Yes Static Sitting Balance Static Sitting - Balance Support: No upper extremity supported;Feet supported Static Sitting - Level of Assistance: 7: Independent Static Standing Balance Static Standing - Balance Support: Bilateral upper extremity supported;During functional activity Dynamic Standing Balance Dynamic Standing - Balance Support: During functional activity;Bilateral upper extremity supported Dynamic Standing - Level of Assistance: 6: Modified independent (Device/Increase time) Dynamic Standing - Comments: Pt Mod UI with dynamic stannding balance during functional mobility activities (side stepping) when bilat UE support on rolling walker. Extremity Assessment  RUE Assessment RUE  Assessment: Within Functional Limits (grossly 5/5) LUE Assessment LUE Assessment: Within Functional Limits (grossly 5/5) RLE Assessment RLE Assessment: Exceptions to [facility] RLE AROM (degrees) Overall AROM Right Lower Extremity: Deficits;Due to pain RLE Overall AROM Comments: Active R knee flexion limited (by pain) to approximately 90 degrees; active R hip extension limited to approximately neutral RLE Strength RLE Overall Strength: Deficits;Due to precautions Right Hip Flexion: 4/5 Right Hip Extension: 3+/5 Right Knee Flexion: 3+/5 Right Knee Extension: 4/5 LLE Assessment LLE Assessment: Within Functional Limits  See FIM for current functional status  Hobble, Malva Cogan 10/02/2013, 4:58 PM

## 2013-10-02 NOTE — Progress Notes (Signed)
Social Work Discharge Note Discharge Note  The overall goal for the admission was met for:   Discharge location: [place]  Length of Stay: Yes-5 DAYS  Discharge activity level: Yes-MOD/I LEVEL  Home/community participation: Yes  Services provided included: MD, RD, PT, OT, RN, CM, TR, Pharmacy and SW  Financial Services: Medicaid  Follow-up services arranged: Home Health: [facility]-PT,RN, DME: [facility]-WHEELCHAIR, ROLLING WALKER, TUB BENCH and Patient/Family has no preference for HH/DME agencies  Comments (or additional information):PT REACHED MOD/I LEVEL GOALS-[facility] CAN PROVIDE INTERMITTENT ASSIST. MEDICAID TRANSPORT NUMBER GIVEN TO PT  Patient/Family verbalized understanding of follow-up arrangements: Yes  Individual responsible for coordination of the follow-up plan: SELF  Confirmed correct DME delivered: Elease Hashimoto 10/02/2013    Elease Hashimoto

## 2013-10-02 NOTE — Progress Notes (Signed)
Subjective/Complaints: 58 y.o. right-handed male with history of hypertension, schizophrenia, diabetes mellitus with peripheral neuropathy and right lower extremity cellulitis. Patient lives alone independent prior to admission and sedentary. Presented 09/19/2013 with right foot also and soft tissue infection. MRI of right foot showed abnormal diffuse subcutaneous and muscular edema consistent with cellulitis. Patient underwent extensive debridement 09/24/2013 per Dr. Percell Miller and placed on antibiotic therapy. 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 Marcelino Scot as well as repeat debridement of wound   Occ pain R Stump, no phantom pain  +constipation , neg abd pain, - N/V  Objective: Vital Signs: Blood pressure 157/92, pulse 58, temperature 98.3 F (36.8 C), temperature source Oral, resp. rate 16, height 6' (1.829 m), weight 94.2 kg (207 lb 10.8 oz), SpO2 98.00%. No results found. Results for orders placed during the hospital encounter of 09/28/13 (from the past 72 hour(s))  GLUCOSE, CAPILLARY     Status: None   Collection Time    09/29/13  7:09 AM      Result Value Ref Range   Glucose-Capillary 88  70 - 99 mg/dL   Comment 1 Notify RN    GLUCOSE, CAPILLARY     Status: Abnormal   Collection Time    09/29/13 11:06 AM      Result Value Ref Range   Glucose-Capillary 107 (*) 70 - 99 mg/dL   Comment 1 Notify RN    GLUCOSE, CAPILLARY     Status: Abnormal   Collection Time    09/29/13  4:39 PM      Result Value Ref Range   Glucose-Capillary 137 (*) 70 - 99 mg/dL   Comment 1 Notify RN     Comment 2 Documented in Chart    GLUCOSE, CAPILLARY     Status: Abnormal   Collection Time    09/29/13  9:06 PM      Result Value Ref Range   Glucose-Capillary 140 (*) 70 - 99 mg/dL  GLUCOSE, CAPILLARY     Status: Abnormal   Collection Time    09/30/13  7:18 AM      Result Value Ref Range   Glucose-Capillary 119 (*) 70 - 99 mg/dL   Comment 1 Notify  RN    GLUCOSE, CAPILLARY     Status: Abnormal   Collection Time    09/30/13 11:12 AM      Result Value Ref Range   Glucose-Capillary 190 (*) 70 - 99 mg/dL   Comment 1 Notify RN    GLUCOSE, CAPILLARY     Status: Abnormal   Collection Time    09/30/13  4:38 PM      Result Value Ref Range   Glucose-Capillary 115 (*) 70 - 99 mg/dL  GLUCOSE, CAPILLARY     Status: Abnormal   Collection Time    09/30/13  8:57 PM      Result Value Ref Range   Glucose-Capillary 199 (*) 70 - 99 mg/dL  GLUCOSE, CAPILLARY     Status: Abnormal   Collection Time    10/01/13  6:51 AM      Result Value Ref Range   Glucose-Capillary 144 (*) 70 - 99 mg/dL  GLUCOSE, CAPILLARY     Status: Abnormal   Collection Time    10/01/13 11:19 AM      Result Value Ref Range   Glucose-Capillary 141 (*) 70 - 99 mg/dL   Comment 1 Notify RN    GLUCOSE, CAPILLARY     Status: Abnormal  Collection Time    10/01/13  4:25 PM      Result Value Ref Range   Glucose-Capillary 128 (*) 70 - 99 mg/dL  GLUCOSE, CAPILLARY     Status: Abnormal   Collection Time    10/01/13  8:45 PM      Result Value Ref Range   Glucose-Capillary 183 (*) 70 - 99 mg/dL     HEENT: normal Cardio: RRR and no murmur Resp: CTA B/L and unlabored GI: BS positive and NT,ND Extremity:  Edema distal stump Skin:   Other R BKA with stump shrinker Neuro: Alert/Oriented, Abnormal Sensory absent LT in Left foot and Abnormal Motor 4/5 in Left HF, KE ANkle DF Musc/Skel:  Normal and Other Right mid tib BKA Gen NAD   Assessment/Plan: 1. Functional deficits secondary to Right BKA which require 3+ hours per day of interdisciplinary therapy in a comprehensive inpatient rehab setting. Physiatrist is providing close team supervision and 24 hour management of active medical problems listed below. Physiatrist and rehab team continue to assess barriers to discharge/monitor patient progress toward functional and medical goals. FIM: FIM - Bathing Bathing Steps Patient  Completed: Chest;Right Arm;Left Arm;Abdomen;Front perineal area;Buttocks;Right upper leg;Left upper leg Bathing: 4: Min-Patient completes 8-9 49f 10 parts or 75+ percent (sit > stand)  FIM - Upper Body Dressing/Undressing Upper body dressing/undressing steps patient completed: Thread/unthread right sleeve of pullover shirt/dresss;Thread/unthread left sleeve of pullover shirt/dress;Put head through opening of pull over shirt/dress;Pull shirt over trunk Upper body dressing/undressing: 7: Complete Independence: No helper FIM - Lower Body Dressing/Undressing Lower body dressing/undressing steps patient completed: Thread/unthread right underwear leg;Thread/unthread left underwear leg;Pull underwear up/down;Thread/unthread right pants leg;Thread/unthread left pants leg;Pull pants up/down;Don/Doff left shoe Lower body dressing/undressing: 5: Supervision: Safety issues/verbal cues  FIM - Toileting Toileting steps completed by patient: Adjust clothing prior to toileting;Performs perineal hygiene;Adjust clothing after toileting Toileting: 4: Steadying assist (per Dorian Heckle, NT)  FIM - Toilet Transfers Toilet Transfers Assistive Devices: Bedside commode [facility] over toilet) Toilet Transfers: 5-To toilet/BSC: Supervision (verbal cues/safety issues);5-From toilet/BSC: Supervision (verbal cues/safety issues)  FIM - Control and instrumentation engineer Devices: Copy: 6: Sit > Supine: No assist;5: Chair or W/C > Bed: Supervision (verbal cues/safety issues);5: Bed > Chair or W/C: Supervision (verbal cues/safety issues);6: Supine > Sit: No assist  FIM - Locomotion: Wheelchair Distance: 200 Locomotion: Wheelchair: 6: Travels 150 ft or more, turns around, maneuvers to table, bed or toilet, negotiates 3% grade: maneuvers on rugs and over door sills independently FIM - Locomotion: Ambulation Locomotion: Ambulation Assistive Devices: Administrator Ambulation/Gait Assistance: 5:  Supervision Locomotion: Ambulation: 2: Travels 50 - 149 ft with supervision/safety issues  Comprehension Comprehension Mode: Auditory Comprehension: 6-Follows complex conversation/direction: With extra time/assistive device  Expression Expression Mode: Verbal Expression: 4-Expresses basic 75 - 89% of the time/requires cueing 10 - 24% of the time. Needs helper to occlude trach/needs to repeat words.  Social Interaction Social Interaction: 4-Interacts appropriately 75 - 89% of the time - Needs redirection for appropriate language or to initiate interaction.  Problem Solving Problem Solving: 4-Solves basic 75 - 89% of the time/requires cueing 10 - 24% of the time  Memory Memory: 5-Recognizes or recalls 90% of the time/requires cueing < 10% of the time  Medical Problem List and Plan:  1. Functional deficits secondary to right BKA 09/26/2013 secondary to progressive ischemia. Stump shrinker ordered per advanced prosthetics.  2. DVT Prophylaxis/Anticoagulation: Subcutaneous heparin. Monitor platelet counts and any signs of bleeding  3. Pain Management: Neurontin  300 mg 3 times a day, oxycodone as needed. Monitor with increased mobility  4. Mood/schizophrenia: Patient appears at baseline  5. Neuropsych: This patient is capable of making decisions on his own behalf.  6. Skin/Wound Care: Routine skin care. Monitor right below-knee amputation site.  7. Acute blood loss anemia. Followup CBC  8. Hypertension. Lisinopril 20 mg daily. Monitor with increased mobility  9. Diabetes mellitus with peripheral neuropathy.Controlled Latest hemoglobin A1c of 14. Lantus insulin 30 units each bedtime. Check blood sugars a.c. and at bedtime. Provide diabetic teaching  10.  Constipation adjust laxatives  LOS (Days) 4 A FACE TO FACE EVALUATION WAS PERFORMED  KIRSTEINS,ANDREW E 10/02/2013, 6:58 AM

## 2013-10-02 NOTE — Progress Notes (Signed)
Occupational Therapy Session Note  Patient Details  Name: [name] MRN: [id number] Date of Birth: 01/21/1956  Today's Date: 10/02/2013 Time: 7614-7092 Time Calculation (min): 60 min  Short Term Goals: Week 1:  OT Short Term Goal 1 (Week 1): STG = LTGs due to ELOS  Skilled Therapeutic Interventions/Progress Updates:    Completed ADL retraining at overall modified independent level.  Pt able to complete bathing and dressing at sit > stand level mod I with improved standing tolerance continuing to alternate UE support while pulling up pants.  Performed w/c mobility in home environment in ADL apt while having pt open and close doors from w/c level.  Ambulated into bathroom with RW with sidestepping through doorway and accessing tub/shower and toilet.  Pt utilized DME appropriately and was mod I with all transfers.  Performed additional sidestepping in gym with pt with 1 LOB when sidestepping to Rt but was able to recover.  Therapeutic activity with Wii bowling with focus on sit <> stand and standing balance with 1 UE.  Pt reports no further questions and ready for d/c tomorrow.  Therapy Documentation Precautions:  Precautions Precautions: Fall Precaution Comments: educated on precautions, s/p BKA Restrictions Weight Bearing Restrictions: Yes RLE Weight Bearing: Non weight bearing Other Position/Activity Restrictions: elevate operated leg, do not prop knee with pillows,  Pain:  pt with no c/o pain  See FIM for current functional status  Therapy/Group: Individual Therapy  Simonne Come 10/02/2013, 9:55 AM

## 2013-10-02 NOTE — Progress Notes (Signed)
Social Work Patient ID: [name], male   DOB: 08-03-1955, 58 y.o.   MRN: [id number] Pt feels ready for discharge tomorrow.  He has met the goals he set for himself and feels he will succeed at home. His sister is coming to transport him home and will make sure he has food at home.

## 2013-10-03 DIAGNOSIS — E1165 Type 2 diabetes mellitus with hyperglycemia: Secondary | ICD-10-CM

## 2013-10-03 DIAGNOSIS — I10 Essential (primary) hypertension: Secondary | ICD-10-CM

## 2013-10-03 DIAGNOSIS — IMO0001 Reserved for inherently not codable concepts without codable children: Secondary | ICD-10-CM

## 2013-10-03 DIAGNOSIS — S88119A Complete traumatic amputation at level between knee and ankle, unspecified lower leg, initial encounter: Secondary | ICD-10-CM

## 2013-10-03 LAB — GLUCOSE, CAPILLARY: Glucose-Capillary: 144 mg/dL — ABNORMAL HIGH (ref 70–99)

## 2013-10-03 NOTE — Progress Notes (Signed)
Patient taken out of hospital via wheelchair. Patient discharging home.

## 2013-10-03 NOTE — Plan of Care (Signed)
Problem: RH Stairs Goal: LTG Patient will ambulate up and down stairs w/assist (PT) LTG: Patient will ambulate up and down # of stairs with assistance (PT)  Outcome: Not Met (add Reason) Pt required min A for curb negotiation. However, sister gave effective return demonstration of bumping wheelchair up/down standard curb.

## 2013-10-03 NOTE — Progress Notes (Signed)
Home discharge instructions and d/c med papers discussed with patient and sister who is at bedide. PT has spoken with patient and sister. Diet, activity and dollow up appointments discussed. Patient and sister verbally understand instructions. Patient walker sent home with him.

## 2013-10-03 NOTE — Progress Notes (Signed)
Subjective/Complaints: 58 y.o. right-handed male with history of hypertension, schizophrenia, diabetes mellitus with peripheral neuropathy and right lower extremity cellulitis. Patient lives alone independent prior to admission and sedentary. Presented 09/19/2013 with right foot also and soft tissue infection. MRI of right foot showed abnormal diffuse subcutaneous and muscular edema consistent with cellulitis. Patient underwent extensive debridement 09/24/2013 per Dr. Percell Miller and placed on antibiotic therapy. 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 Marcelino Scot as well as repeat debridement of wound   In good spirits. Denies pain. Ready to go home    Objective: Vital Signs: Blood pressure 165/94, pulse 63, temperature 98.4 F (36.9 C), temperature source Oral, resp. rate 16, height 6' (1.829 m), weight 94.2 kg (207 lb 10.8 oz), SpO2 96.00%. No results found. Results for orders placed during the hospital encounter of 09/28/13 (from the past 72 hour(s))  GLUCOSE, CAPILLARY     Status: Abnormal   Collection Time    09/30/13 11:12 AM      Result Value Ref Range   Glucose-Capillary 190 (*) 70 - 99 mg/dL   Comment 1 Notify RN    GLUCOSE, CAPILLARY     Status: Abnormal   Collection Time    09/30/13  4:38 PM      Result Value Ref Range   Glucose-Capillary 115 (*) 70 - 99 mg/dL  GLUCOSE, CAPILLARY     Status: Abnormal   Collection Time    09/30/13  8:57 PM      Result Value Ref Range   Glucose-Capillary 199 (*) 70 - 99 mg/dL  GLUCOSE, CAPILLARY     Status: Abnormal   Collection Time    10/01/13  6:51 AM      Result Value Ref Range   Glucose-Capillary 144 (*) 70 - 99 mg/dL  GLUCOSE, CAPILLARY     Status: Abnormal   Collection Time    10/01/13 11:19 AM      Result Value Ref Range   Glucose-Capillary 141 (*) 70 - 99 mg/dL   Comment 1 Notify RN    GLUCOSE, CAPILLARY     Status: Abnormal   Collection Time    10/01/13  4:25 PM   Result Value Ref Range   Glucose-Capillary 128 (*) 70 - 99 mg/dL  GLUCOSE, CAPILLARY     Status: Abnormal   Collection Time    10/01/13  8:45 PM      Result Value Ref Range   Glucose-Capillary 183 (*) 70 - 99 mg/dL  GLUCOSE, CAPILLARY     Status: Abnormal   Collection Time    10/02/13  7:15 AM      Result Value Ref Range   Glucose-Capillary 122 (*) 70 - 99 mg/dL  GLUCOSE, CAPILLARY     Status: Abnormal   Collection Time    10/02/13 11:48 AM      Result Value Ref Range   Glucose-Capillary 114 (*) 70 - 99 mg/dL   Comment 1 Notify RN    GLUCOSE, CAPILLARY     Status: Abnormal   Collection Time    10/02/13  4:23 PM      Result Value Ref Range   Glucose-Capillary 189 (*) 70 - 99 mg/dL  GLUCOSE, CAPILLARY     Status: Abnormal   Collection Time    10/02/13  8:51 PM      Result Value Ref Range   Glucose-Capillary 181 (*) 70 - 99 mg/dL  GLUCOSE, CAPILLARY     Status: Abnormal   Collection Time  10/03/13  7:14 AM      Result Value Ref Range   Glucose-Capillary 144 (*) 70 - 99 mg/dL   Comment 1 Notify RN       HEENT: normal Cardio: RRR and no murmur Resp: CTA B/L and unlabored GI: BS positive and NT,ND Extremity:  Edema distal stump Skin:   Other R BKA with stump shrinker Neuro: Alert/Oriented, Abnormal Sensory absent LT in Left foot and Abnormal Motor 4/5 in Left HF, KE ANkle DF Musc/Skel:  Normal and Other Right mid tib BKA Gen NAD   Assessment/Plan: 1. Functional deficits secondary to Right BKA which require 3+ hours per day of interdisciplinary therapy in a comprehensive inpatient rehab setting. Physiatrist is providing close team supervision and 24 hour management of active medical problems listed below. Physiatrist and rehab team continue to assess barriers to discharge/monitor patient progress toward functional and medical goals.  Dc home. Reapplied shrinker. F/u arranged   FIM: FIM - Bathing Bathing Steps Patient Completed: Chest;Right Arm;Left  Arm;Abdomen;Front perineal area;Buttocks;Right upper leg;Left upper leg;Left lower leg (including foot) Bathing: 6: More than reasonable amount of time  FIM - Upper Body Dressing/Undressing Upper body dressing/undressing steps patient completed: Thread/unthread right sleeve of pullover shirt/dresss;Thread/unthread left sleeve of pullover shirt/dress;Put head through opening of pull over shirt/dress;Pull shirt over trunk Upper body dressing/undressing: 7: Complete Independence: No helper FIM - Lower Body Dressing/Undressing Lower body dressing/undressing steps patient completed: Thread/unthread right underwear leg;Thread/unthread left underwear leg;Pull underwear up/down;Thread/unthread right pants leg;Thread/unthread left pants leg;Pull pants up/down;Don/Doff left shoe Lower body dressing/undressing: 6: More than reasonable amount of time  FIM - Toileting Toileting steps completed by patient: Adjust clothing prior to toileting;Performs perineal hygiene;Adjust clothing after toileting Toileting Assistive Devices: Grab bar or rail for support Toileting: 6: Assistive device: No helper  FIM - Radio producer Devices: Bedside commode;Walker (BSC over toilet) Toilet Transfers: 6-Assistive device: No helper;6-To toilet/ BSC;6-From toilet/BSC  FIM - Control and instrumentation engineer Devices: Environmental consultant;Arm rests Bed/Chair Transfer: 7: Supine > Sit: No assist;7: Sit > Supine: No assist;6: Bed > Chair or W/C: No assist;6: Chair or W/C > Bed: No assist  FIM - Locomotion: Wheelchair Distance: 300 Locomotion: Wheelchair: 6: Travels 150 ft or more, turns around, maneuvers to table, bed or toilet, negotiates 3% grade: maneuvers on rugs and over door sills independently FIM - Locomotion: Ambulation Locomotion: Ambulation Assistive Devices: Administrator Ambulation/Gait Assistance: 6: Modified independent (Device/Increase time) Locomotion: Ambulation: 6: Travels  150 ft or more with assistive device/no helper  Comprehension Comprehension Mode: Auditory Comprehension: 5-Follows basic conversation/direction: With no assist  Expression Expression Mode: Verbal Expression: 5-Expresses basic 90% of the time/requires cueing < 10% of the time.  Social Interaction Social Interaction: 4-Interacts appropriately 75 - 89% of the time - Needs redirection for appropriate language or to initiate interaction.  Problem Solving Problem Solving: 4-Solves basic 75 - 89% of the time/requires cueing 10 - 24% of the time  Memory Memory: 5-Recognizes or recalls 90% of the time/requires cueing < 10% of the time  Medical Problem List and Plan:  1. Functional deficits secondary to right BKA 09/26/2013 secondary to progressive ischemia. Stump shrinker ordered per [facility].  2. DVT Prophylaxis/Anticoagulation: Subcutaneous heparin. Monitor platelet counts and any signs of bleeding  3. Pain Management: Neurontin 300 mg 3 times a day, oxycodone as needed. Monitor with increased mobility  4. Mood/schizophrenia: Patient appears at baseline  5. Neuropsych: This patient is capable of making decisions on his own behalf.  6. Skin/Wound Care: Routine skin care. Monitor right below-knee amputation site.  7. Acute blood loss anemia. Followup CBC  8. Hypertension. Lisinopril 20 mg daily. Monitor with increased mobility  9. Diabetes mellitus with peripheral neuropathy.Controlled Latest hemoglobin A1c of 14. Lantus insulin 30 units each bedtime. Check blood sugars a.c. and at bedtime. Provide diabetic teaching  10.  Constipation resolved  LOS (Days) 5 A FACE TO FACE EVALUATION WAS PERFORMED  SWARTZ,ZACHARY T 10/03/2013, 8:18 AM

## 2013-10-05 ENCOUNTER — Ambulatory Visit: Payer: Medicaid Other | Admitting: Family Medicine

## 2013-10-06 ENCOUNTER — Telehealth: Payer: Self-pay | Admitting: Family Medicine

## 2013-10-06 NOTE — Telephone Encounter (Signed)
[name] called and wanted the team to know several things. The pt is recovering from a hospital stay he has below the knee amputation. They have been working with him for PT safety and would like verbal orders for a Education officer, museum for pt. The pt is not aware that he has been diagnoses with schizophrenia and he is also not taking any meds for this. Please call [name] at [phone] with any questions jw

## 2013-10-07 NOTE — Telephone Encounter (Signed)
I called Jim back (PT with [facility]) and left VM for verbal order for social worker. If he has any other questions asked him to call the clinic back. -Dr [name]

## 2013-10-23 DIAGNOSIS — Z4801 Encounter for change or removal of surgical wound dressing: Secondary | ICD-10-CM

## 2013-10-23 DIAGNOSIS — Z1329 Encounter for screening for other suspected endocrine disorder: Secondary | ICD-10-CM

## 2013-10-23 DIAGNOSIS — Z48812 Encounter for surgical aftercare following surgery on the circulatory system: Secondary | ICD-10-CM

## 2013-10-23 DIAGNOSIS — E1149 Type 2 diabetes mellitus with other diabetic neurological complication: Secondary | ICD-10-CM

## 2013-10-23 DIAGNOSIS — I10 Essential (primary) hypertension: Secondary | ICD-10-CM

## 2013-10-23 DIAGNOSIS — H5440 Blindness, one eye, unspecified eye: Secondary | ICD-10-CM

## 2013-10-23 DIAGNOSIS — I999 Unspecified disorder of circulatory system: Secondary | ICD-10-CM

## 2013-10-23 DIAGNOSIS — E1142 Type 2 diabetes mellitus with diabetic polyneuropathy: Secondary | ICD-10-CM

## 2013-10-26 ENCOUNTER — Encounter: Payer: Self-pay | Admitting: Physical Medicine & Rehabilitation

## 2013-10-30 ENCOUNTER — Other Ambulatory Visit (HOSPITAL_COMMUNITY): Payer: Self-pay | Admitting: Family Medicine

## 2013-11-20 ENCOUNTER — Inpatient Hospital Stay: Payer: Medicaid Other | Admitting: Physical Medicine & Rehabilitation

## 2013-11-26 ENCOUNTER — Other Ambulatory Visit: Payer: Self-pay | Admitting: Family Medicine

## 2013-12-01 ENCOUNTER — Encounter: Payer: Medicaid Other | Attending: Physical Medicine & Rehabilitation

## 2013-12-01 ENCOUNTER — Encounter: Payer: Self-pay | Admitting: Physical Medicine & Rehabilitation

## 2013-12-01 ENCOUNTER — Ambulatory Visit (HOSPITAL_BASED_OUTPATIENT_CLINIC_OR_DEPARTMENT_OTHER): Payer: Medicaid Other | Admitting: Physical Medicine & Rehabilitation

## 2013-12-01 VITALS — BP 158/103 | HR 86 | Resp 14

## 2013-12-01 DIAGNOSIS — Z4789 Encounter for other orthopedic aftercare: Secondary | ICD-10-CM | POA: Diagnosis not present

## 2013-12-01 DIAGNOSIS — E1142 Type 2 diabetes mellitus with diabetic polyneuropathy: Secondary | ICD-10-CM | POA: Diagnosis not present

## 2013-12-01 DIAGNOSIS — Z89511 Acquired absence of right leg below knee: Secondary | ICD-10-CM

## 2013-12-01 DIAGNOSIS — E1149 Type 2 diabetes mellitus with other diabetic neurological complication: Secondary | ICD-10-CM | POA: Diagnosis not present

## 2013-12-01 DIAGNOSIS — S88119A Complete traumatic amputation at level between knee and ankle, unspecified lower leg, initial encounter: Secondary | ICD-10-CM | POA: Insufficient documentation

## 2013-12-01 DIAGNOSIS — E1140 Type 2 diabetes mellitus with diabetic neuropathy, unspecified: Secondary | ICD-10-CM | POA: Insufficient documentation

## 2013-12-01 NOTE — Progress Notes (Signed)
[name] August 11, 1955 Hospital followup following right below knee amputation. Date of surgery 09/26/2013 Following discharge from inpatient rehabilitation, patient had home health therapy with PT OT and nursing. Patient is now independent with dressing and bathing and mobility at a wheelchair level. He has been measured for his prosthesis and is waiting for this to be made. Seen by orthopedic surgery on 3 occasions, prosthetic was ordered through that office. Patient still needs assistance with shopping sister takes into the store. Review of systems problems with depression as well as tingling in the distal stump area. Also with constipation and high blood sugars. Current medications include Neurontin 300 mg 3 times per day Lantus insulin 20 units per day Lisinopril 40 mg per day   Blood pressure 158/103, pulse 86, respirations 14, O2 sat 99% on room air Right distal stump has 1.5 cm open area along the incision line. Granulation tissue also smaller 1 cm areas along the medial joint line dry without drainage. Stump has minimal edema. Good knee motion. Left foot without evidence of skin lesion callus at the first MTP plantar surface. No swelling. Decreased sensation to light touch below the ankle of the left foot. Upper extremity strength is normal Left lower extremity strength is normal  Impression #1. Right BKA some skin areas still remained to be closed. Minimal edema. Awaiting prosthetic fabrication. Orthopedics will order PT. Continue dry sterile dressing change daily may use this foreign to open areas to keep moist.  Discussed right hip exercise prior to restarting physical therapy for prosthetic training. Doing well from a pain management standpoint no narcotic medications needed 2. Diabetic neuropathy continue foot inspection, keep "clean with white sock every day. Continue gabapentin 300 mg 3 times a day, primary care to refill as needed Continue primary care followup for diabetic  management as well as hypertension management F/u Physical medicine rehabilitation followup when necessary

## 2013-12-01 NOTE — Progress Notes (Signed)
   Subjective:    Patient ID: [name], male    DOB: Dec 23, 1955, 58 y.o.   MRN: [id number]  HPI  Pain Inventory Average Pain 4 Pain Right Now 3 My pain is tingling  In the last 24 hours, has pain interfered with the following? General activity 1 Relation with others 1 Enjoyment of life 1 What TIME of day is your pain at its worst? daytime Sleep (in general) Fair  Pain is worse with: unsure Pain improves with: rest Relief from Meds: 2  Mobility walk with assistance use a walker how many minutes can you walk? 2 ability to climb steps?  no do you drive?  no use a wheelchair  Function disabled: date disabled 2005 I need assistance with the following:  shopping  Neuro/Psych tingling trouble walking depression  Prior Studies Any changes since last visit?  no  Physicians involved in your care Any changes since last visit?  no   History reviewed. No pertinent family history. History   Social History  . Marital Status: Married    Spouse Name: N/A    Number of Children: N/A  . Years of Education: N/A   Social History Main Topics  . Smoking status: Never Smoker   . Smokeless tobacco: None  . Alcohol Use: No  . Drug Use: No  . Sexual Activity: None   Other Topics Concern  . None   Social History Narrative   ** Merged History Encounter **       Lives alone.    Past Surgical History  Procedure Laterality Date  . I&d extremity Right 09/24/2013    Procedure: IRRIGATION AND DEBRIDEMENT RIGHT FOOT ULCER;  Surgeon: Renette Butters, MD;  Location: [place];  Service: Orthopedics;  Laterality: Right;  . I&d extremity Right 09/26/2013    Procedure: Repeat IRRIGATION AND DEBRIDEMENT Right Foot Ulcer;  Surgeon: Rozanna Box, MD;  Location: [place];  Service: Orthopedics;  Laterality: Right;  . Amputation Right 09/26/2013    Procedure: AMPUTATION BELOW KNEE;  Surgeon: Rozanna Box, MD;  Location: [place];  Service: Orthopedics;  Laterality: Right;   Past  Medical History  Diagnosis Date  . Diabetes mellitus   . Hypertension    BP 158/103  Pulse 86  Resp 14  SpO2 99%  Opioid Risk Score:   Fall Risk Score: Moderate Fall Risk (6-13 points) (educated and given handout on fall preveniton)   Review of Systems  Endocrine:       High blood sugars  Musculoskeletal: Positive for gait problem.  Neurological:       Tingling  Psychiatric/Behavioral: Positive for dysphoric mood.  All other systems reviewed and are negative.      Objective:   Physical Exam        Assessment & Plan:

## 2014-03-11 ENCOUNTER — Ambulatory Visit: Payer: Medicaid Other | Attending: Orthopedic Surgery | Admitting: Physical Therapy

## 2014-03-11 ENCOUNTER — Encounter: Payer: Self-pay | Admitting: Physical Therapy

## 2014-03-11 DIAGNOSIS — Z89511 Acquired absence of right leg below knee: Secondary | ICD-10-CM | POA: Diagnosis not present

## 2014-03-11 DIAGNOSIS — R6889 Other general symptoms and signs: Secondary | ICD-10-CM | POA: Diagnosis not present

## 2014-03-11 DIAGNOSIS — R269 Unspecified abnormalities of gait and mobility: Secondary | ICD-10-CM | POA: Diagnosis not present

## 2014-03-11 NOTE — Therapy (Signed)
[facility] [address] [place], [place], [address] Phone: [phone]   Fax:  [phone]  Physical Therapy Evaluation  Patient Details  Name: [name] MRN: [id number] Date of Birth: Feb 12, 1956 Referring Provider:  Leone Brand, MD  Encounter Date: 03/11/2014      PT End of Session - 03/11/14 1514    Visit Number 1   Number of Visits 9   Authorization Type Medicaid   PT Start Time 1400   PT Stop Time 1445   PT Time Calculation (min) 45 min   Equipment Utilized During Treatment Gait belt   Activity Tolerance Patient tolerated treatment well   Behavior During Therapy [facility] for tasks assessed/performed      Past Medical History  Diagnosis Date  . Diabetes mellitus   . Hypertension     Past Surgical History  Procedure Laterality Date  . I&d extremity Right 09/24/2013    Procedure: IRRIGATION AND DEBRIDEMENT RIGHT FOOT ULCER;  Surgeon: Renette Butters, MD;  Location: [facility];  Service: Orthopedics;  Laterality: Right;  . I&d extremity Right 09/26/2013    Procedure: Repeat IRRIGATION AND DEBRIDEMENT Right Foot Ulcer;  Surgeon: Rozanna Box, MD;  Location: [facility];  Service: Orthopedics;  Laterality: Right;  . Amputation Right 09/26/2013    Procedure: AMPUTATION BELOW KNEE;  Surgeon: Rozanna Box, MD;  Location: [place] [facility];  Service: Orthopedics;  Laterality: Right;    There were no vitals taken for this visit.  Visit Diagnosis:  Abnormality of gait - Plan: PT plan of care cert/re-cert  Activity intolerance - Plan: PT plan of care cert/re-cert  Status post below knee amputation of right lower extremity - Plan: PT plan of care cert/re-cert      Subjective Assessment - 03/11/14 1510    Symptoms This 59yo male underwent a right Transtibial Amputation on 09/26/13 due to infection. He recieved his first prosthesis 02/10/14. He is dependent in use & care. He presents for PT evaluation & prosthetic training.           [facility] PT Assessment - 03/11/14 1400    Assessment   Medical Diagnosis Right Transtibial Amputation   Onset Date 02/10/14   Precautions   Precautions Fall   Restrictions   Weight Bearing Restrictions No   Balance Screen   Has the patient fallen in the past 6 months Yes   How many times? 1  fell during transfer & opened right limb   Has the patient had a decrease in activity level because of a fear of falling?  Yes   Is the patient reluctant to leave their home because of a fear of falling?  Yes   [place] Private residence   Living Arrangements Alone   Type of [place] Access Other (comment)  curb at parking lot only   Prior Function   Level of Independence Independent with basic ADLs;Independent with homemaking with ambulation;Independent with gait;Independent with transfers  Walked miles in community & took city bus   Vocation On disability   Observation/Other Assessments   Focus on Therapeutic Outcomes (FOTO)  47   Posture/Postural Control   Posture/Postural Control Postural limitations   Postural Limitations Rounded Shoulders;Forward head;Flexed trunk;Weight shift left   Strength   Overall Strength Within functional limits for tasks performed   Flexibility   Soft Tissue Assessment /Muscle Lenght yes   Hamstrings seated with hip 90: knee extension right -40  Ambulation/Gait   Ambulation/Gait Yes   Ambulation/Gait Assistance 4: Min assist   Ambulation/Gait Assistance Details Patient arrived with RW & assessed without device   Ambulation Distance (Feet) 100 Feet   Assistive device None  prosthesis only   Gait Pattern Step-through pattern;Decreased step length - left;Decreased stance time - right;Decreased stride length;Decreased weight shift to right;Decreased hip/knee flexion - right;Right hip hike;Abducted- right;Poor foot clearance - right   Gait velocity 2.79 ft/sec   Berg Balance Test   Sit to Stand Able to stand   independently using hands   Standing Unsupported Able to stand safely 2 minutes   Sitting with Back Unsupported but Feet Supported on Floor or Stool Able to sit safely and securely 2 minutes   Stand to Sit Controls descent by using hands   Transfers Able to transfer safely, definite need of hands   Standing Unsupported with Eyes Closed Able to stand 10 seconds with supervision   Standing Ubsupported with Feet Together Able to place feet together independently but unable to hold for 30 seconds   From Standing, Reach Forward with Outstretched Arm Can reach forward >5 cm safely (2")   From Standing Position, Pick up Object from Floor Able to pick up shoe, needs supervision   From Standing Position, Turn to Look Behind Over each Shoulder Turn sideways only but maintains balance   Turn 360 Degrees Needs close supervision or verbal cueing   Standing Unsupported, Alternately Place Feet on Step/Stool Needs assistance to keep from falling or unable to try   Standing Unsupported, One Foot in Front Able to take small step independently and hold 30 seconds   Standing on One Leg Tries to lift leg/unable to hold 3 seconds but remains standing independently   Total Score 33   Timed Up and Go Test   Normal TUG (seconds) 17.47         Prosthetics Assessment - 03/11/14 1400    Prosthetic Care Dependent with Skin check;Residual limb care;Care of non-amputated limb;Prosthetic cleaning;Ply sock cleaning;Correct ply sock adjustment;Proper wear schedule/adjustment;Proper weight-bearing schedule/adjustment   Donning prosthesis  Supervision   Doffing prosthesis  Modified independent (Device/Increase time)   Current prosthetic wear tolerance (days/week)  7 days/wk   Current prosthetic wear tolerance (#hours/day)  wearing all awake hours but has wound & skin is too moist from sweat with increasing wear too rapidly.   Current prosthetic weight-bearing tolerance (hours/day)  Patient tolerated 10 minutes of standing  with partial wt on prosthesis without c/o pain or discomfort.   Edema mild, non-pitting   K code/activity level with prosthetic use  3-4                 [facility] Adult PT Treatment/Exercise - 03/11/14 1400    Prosthetics   Residual limb condition  wound on medical incision 79mm X 69mm   Education Provided Skin check;Residual limb care;Prosthetic cleaning;Ply sock cleaning;Correct ply sock adjustment;Proper wear schedule/adjustment;Proper weight-bearing schedule/adjustment  Wear prosthesis 4 hrs on/ 1 hr off rotating thru day, use RW   Person(s) Educated Patient   Education Method Explanation;Demonstration;Tactile cues;Verbal cues   Education Method Verbalized understanding   Donning Prosthesis Supervision  cues on technique   Doffing Prosthesis Modified independent (device/increased time)                PT Education - 03/11/14 1400    Education provided Yes   Education Details see prosthetic care   Person(s) Educated Patient   Methods Explanation;Demonstration;Tactile cues;Verbal cues   Comprehension Verbalized  understanding          PT Short Term Goals - 03/11/14 1400    PT SHORT TERM GOAL #1   Title verbalizes proper prosthetic care except cues how to adjust ply socks. (Target Date: 5th visit)   Baseline Dependent in prosthetic care & has wound on residual limb.   Time 4   Period Weeks   Status New   PT SHORT TERM GOAL #2   Title tolerates wear of prosthesis >80% of awake hours & wound is healing. (Target Date: 5th visit)   Baseline Patient is wearing all awake hours but skin is too moist from sweat & wounds present with signs of over moisture. He increased wear too rapidly.   Time 4   Period Weeks   Status New   PT SHORT TERM GOAL #3   Title ambulates 500' with prosthesis only with cues for deviations. (Target Date: 5th visit)   Baseline ambulates 100' with prosthesis only with minimal assist.    Time 4   Period Weeks   Status New   PT SHORT TERM GOAL  #4   Title negotiate ramps & curbs with prosthesis only with supervision. (Target Date: 5th visit)   Baseline patient is dependent in ambulating with prosthesis on barriers.   Time 4   Period Weeks   Status New           PT Long Term Goals - 03/11/14 1400    PT LONG TERM GOAL #1   Title Patient verbalizes proper prosthetic care. (Target Date: 9th visit)   Baseline Patient is dependent in prosthetic care & has wound on residual limb.   Time 8   Period Weeks   Status New   PT LONG TERM GOAL #2   Title Patient tolerates wear of prosthesis >90% of all awake hours with skin issues or pain /discomfort. (9th visit)   Baseline Patient progressed wear too rapidly with skin too moist from sweat and wounds on residual limb.   Time 8   Period Weeks   Status New   PT LONG TERM GOAL #3   Title ambulates >2000' with prosthesis only independently. (9th visit)   Baseline ambulates 100' with prosthesis only with minimal assist.   Time 8   Period Weeks   Status New   PT LONG TERM GOAL #4   Title Patient negotiates ramp, curb, stairs & uneven surfaces like grass with prosthesis only independent. (Target Date 9th visit)   Baseline Patient is dependent in negotiating barriers with a prosthesis.   Time 8   Period Weeks   Status New   PT LONG TERM GOAL #5   Title Berg Balance >/= 45/56 (Target Date 9th visit)   Baseline Berg Balance 37/56 (<45/56 indicates Fall Risk)   Time 8   Period Weeks   Status New   Additional Long Term Goals   Additional Long Term Goals --   PT LONG TERM GOAL #6   Title Timed Up & Go with prosthesis only <13.5 seconds (Target Date 9th visit)   Baseline Timed Up & Go with prosthesis only 17.47 seconds (>13.5 seconds indicates fall risk)   Time 8   Period Weeks   Status New   PT LONG TERM GOAL #7   Title lifts 20# and carries >200' with prosthesis only independently. (Target Date 9th visit)   Baseline Patient is unable to lift or carry items with prosthesis.   Time  8   Period Weeks   Status New  PT LONG TERM GOAL #8   Title Functional Status score on FOTO (self-reported mobility) improves by >10 points. (Target Date 9th visit)   Baseline Patient functional status score of 47   Time 8   Period Weeks   Status New               Plan - 03/11/14 1514    Clinical Impression Statement This 59yo male has excellent potential to use prosthesis to return to prior level of function ambulating long distances for full community access with variable cadence (K4 level). He recieved his first prosthesis 02/10/14 and is dependent in care /use. He has wound on his residual limb requiring skilled care to progress wear /function with prosthesis safely. His Berg Balance score of 37/56 and Timed Up-Go 17.47sec indicate fall risk.   Pt will benefit from skilled therapeutic intervention in order to improve on the following deficits Abnormal gait;Decreased activity tolerance;Decreased balance;Decreased endurance;Decreased mobility;Decreased skin integrity;Other (comment)  prosthetic dependency   Rehab Potential Excellent   PT Frequency 1x / week   PT Duration 8 weeks   PT Treatment/Interventions ADLs/Self Care Home Management;Gait training;Stair training;Functional mobility training;Therapeutic activities;Therapeutic exercise;Balance training;Neuromuscular re-education;Patient/family education;Other (comment)  prosthetic training   PT Next Visit Plan review prosthetic care, check wound, prosthetic gait training including barriers   Consulted and Agree with Plan of Care Patient         Problem List Patient Active Problem List   Diagnosis Date Noted  . Diabetic neuropathy associated with type 2 diabetes mellitus 12/01/2013  . Hx of BKA 09/28/2013  . Cellulitis 09/19/2013  . Abdominal pain, unspecified site 09/06/2013  . Noncompliance with medications 07/13/2010  . OTHER SPEC TYPES SCHIZOPHRENIA UNSPEC CONDITION 05/24/2009  . Obesity, unspecified 06/09/2007  .  DM (diabetes mellitus), type 2, uncontrolled 05/02/2006  . HYPERTENSION, BENIGN SYSTEMIC 05/02/2006    Jamey Reas 03/11/2014, 3:42 PM  [facility] [address] [place], [place], [address] Phone: [phone]   Fax:  [phone]    Jamey Reas, [place], DPT PT Specializing in [place] 03/11/2014 3:42 PM Phone:  [phone]  Fax:  [phone] [facility] [address] [place], [place] [address]

## 2014-03-25 ENCOUNTER — Ambulatory Visit: Payer: Medicaid Other | Admitting: Physical Therapy

## 2014-03-25 ENCOUNTER — Encounter: Payer: Self-pay | Admitting: Physical Therapy

## 2014-03-25 DIAGNOSIS — R6889 Other general symptoms and signs: Secondary | ICD-10-CM

## 2014-03-25 DIAGNOSIS — Z89511 Acquired absence of right leg below knee: Secondary | ICD-10-CM

## 2014-03-25 DIAGNOSIS — R269 Unspecified abnormalities of gait and mobility: Secondary | ICD-10-CM

## 2014-03-25 NOTE — Therapy (Signed)
[facility] [address] [place], [place], [address] Phone: [phone]   Fax:  [phone]  Physical Therapy Treatment  Patient Details  Name: [name] MRN: [id number] Date of Birth: 1956/02/22 Referring Provider:  Leone Brand, MD  Encounter Date: 03/25/2014      PT End of Session - 03/25/14 1015    Visit Number 2   Number of Visits 9   Authorization Type Medicaid   Authorization Time Period 1/11-05/09/14    Authorization - Visit Number 1   Authorization - Number of Visits 8   PT Start Time 1015   PT Stop Time 1100   PT Time Calculation (min) 45 min   Equipment Utilized During Treatment Gait belt   Activity Tolerance Patient tolerated treatment well   Behavior During Therapy [facility] for tasks assessed/performed      Past Medical History  Diagnosis Date  . Diabetes mellitus   . Hypertension     Past Surgical History  Procedure Laterality Date  . I&d extremity Right 09/24/2013    Procedure: IRRIGATION AND DEBRIDEMENT RIGHT FOOT ULCER;  Surgeon: Renette Butters, MD;  Location: [facility];  Service: Orthopedics;  Laterality: Right;  . I&d extremity Right 09/26/2013    Procedure: Repeat IRRIGATION AND DEBRIDEMENT Right Foot Ulcer;  Surgeon: Rozanna Box, MD;  Location: [facility];  Service: Orthopedics;  Laterality: Right;  . Amputation Right 09/26/2013    Procedure: AMPUTATION BELOW KNEE;  Surgeon: Rozanna Box, MD;  Location: [facility];  Service: Orthopedics;  Laterality: Right;    There were no vitals taken for this visit.  Visit Diagnosis:  Abnormality of gait  Activity intolerance  Status post below knee amputation of right lower extremity      Subjective Assessment - 03/25/14 1030    Symptoms He reports wearing prosthesis from time he gets up to getting ready for bed except off ~1  hour mid-day. No falls.   Currently in Pain? No/denies                    [facility] Adult PT  Treatment/Exercise - 03/25/14 1015    Ambulation/Gait   Ambulation/Gait Yes   Ambulation/Gait Assistance 4: Min assist;4: Min guard   Ambulation/Gait Assistance Details demo, verbal cues on sequence & posture with cane   Ambulation Distance (Feet) 250 Feet  3x   Assistive device Straight cane;Prosthesis   Gait Pattern Step-through pattern;Decreased stance time - right;Decreased step length - left   Ambulation Surface Level;Indoor   Stairs Yes   Stairs Assistance 5: Supervision   Stairs Assistance Details (indicate cue type and reason) verbal & demo sequence & technique   Stair Management Technique One rail Right;With cane  prosthesis   Number of Stairs 4   Ramp 4: Min assist  single point cane & prosthesis   Ramp Details (indicate cue type and reason) PT demo /instructed technique & wt shift/posture   Curb 4: Min assist  single point cane & prosthesis   Curb Details (indicate cue type and reason) PT demo/instructed technique /step length/sequence   Dynamic Standing Balance   Dynamic Standing - Balance Support No upper extremity supported  PT giving min A /tactile cues at pelvis   Dynamic Standing - Level of Assistance 4: Min assist;5: Stand by assistance   Dynamic Standing - Balance Activities Lateral lean/weight shifting;Forward lean/weight shifting   Dynamic Standing - Comments Cues on using socket interface to give proprioception to floor  Prosthetics   Current prosthetic wear tolerance (days/week)  7 days/wk   Current prosthetic wear tolerance (#hours/day)  wearing all awake hours but has wound & skin is too moist from sweat with increasing wear too rapidly.   Edema mild, non-pitting   Residual limb condition  small (87mm X 51mm) dry scab on medial incision   Education Provided Skin check;Residual limb care;Proper wear schedule/adjustment  drying limb /liner q4hours   Person(s) Educated Patient   Education Method Explanation   Education Method Verbalized understanding    Donning Prosthesis Modified independent (device/increased time)   Doffing Prosthesis Modified independent (device/increased time)                PT Education - 03/25/14 1015    Education provided Yes   Education Details see prosthetic instruction, midline standing   Person(s) Educated Patient   Methods Explanation;Demonstration;Tactile cues   Comprehension Verbalized understanding;Returned demonstration          PT Short Term Goals - 03/11/14 1400    PT SHORT TERM GOAL #1   Title verbalizes proper prosthetic care except cues how to adjust ply socks. (Target Date: 5th visit)   Baseline Dependent in prosthetic care & has wound on residual limb.   Time 4   Period Weeks   Status New   PT SHORT TERM GOAL #2   Title tolerates wear of prosthesis >80% of awake hours & wound is healing. (Target Date: 5th visit)   Baseline Patient is wearing all awake hours but skin is too moist from sweat & wounds present with signs of over moisture. He increased wear too rapidly.   Time 4   Period Weeks   Status New   PT SHORT TERM GOAL #3   Title ambulates 500' with prosthesis only with cues for deviations. (Target Date: 5th visit)   Baseline ambulates 100' with prosthesis only with minimal assist.    Time 4   Period Weeks   Status New   PT SHORT TERM GOAL #4   Title negotiate ramps & curbs with prosthesis only with supervision. (Target Date: 5th visit)   Baseline patient is dependent in ambulating with prosthesis on barriers.   Time 4   Period Weeks   Status New           PT Long Term Goals - 03/11/14 1400    PT LONG TERM GOAL #1   Title Patient verbalizes proper prosthetic care. (Target Date: 9th visit)   Baseline Patient is dependent in prosthetic care & has wound on residual limb.   Time 8   Period Weeks   Status New   PT LONG TERM GOAL #2   Title Patient tolerates wear of prosthesis >90% of all awake hours with skin issues or pain /discomfort. (9th visit)   Baseline  Patient progressed wear too rapidly with skin too moist from sweat and wounds on residual limb.   Time 8   Period Weeks   Status New   PT LONG TERM GOAL #3   Title ambulates >2000' with prosthesis only independently. (9th visit)   Baseline ambulates 100' with prosthesis only with minimal assist.   Time 8   Period Weeks   Status New   PT LONG TERM GOAL #4   Title Patient negotiates ramp, curb, stairs & uneven surfaces like grass with prosthesis only independent. (Target Date 9th visit)   Baseline Patient is dependent in negotiating barriers with a prosthesis.   Time 8   Period Weeks  Status New   PT LONG TERM GOAL #5   Title Berg Balance >/= 45/56 (Target Date 9th visit)   Baseline Berg Balance 37/56 (<45/56 indicates Fall Risk)   Time 8   Period Weeks   Status New   Additional Long Term Goals   Additional Long Term Goals --   PT LONG TERM GOAL #6   Title Timed Up & Go with prosthesis only <13.5 seconds (Target Date 9th visit)   Baseline Timed Up & Go with prosthesis only 17.47 seconds (>13.5 seconds indicates fall risk)   Time 8   Period Weeks   Status New   PT LONG TERM GOAL #7   Title lifts 20# and carries >200' with prosthesis only independently. (Target Date 9th visit)   Baseline Patient is unable to lift or carry items with prosthesis.   Time 8   Period Weeks   Status New   PT LONG TERM GOAL #8   Title Functional Status score on FOTO (self-reported mobility) improves by >10 points. (Target Date 9th visit)   Baseline Patient functional status score of 47   Time 8   Period Weeks   Status New               Plan - 03/25/14 1015    Clinical Impression Statement Patient appeared safe with single point cane and prosthesis for gait with no balance issues or pain noted.   Pt will benefit from skilled therapeutic intervention in order to improve on the following deficits Abnormal gait;Decreased activity tolerance;Decreased balance;Decreased endurance;Decreased  mobility;Decreased skin integrity;Other (comment)  prosthetic dependency   Rehab Potential Excellent   PT Frequency 1x / week   PT Duration 8 weeks   PT Treatment/Interventions ADLs/Self Care Home Management;Gait training;Stair training;Functional mobility training;Therapeutic activities;Therapeutic exercise;Balance training;Neuromuscular re-education;Patient/family education;Other (comment)  prosthetic training   PT Next Visit Plan review prosthetic care, check wound, prosthetic gait training including barriers   Consulted and Agree with Plan of Care Patient        Problem List Patient Active Problem List   Diagnosis Date Noted  . Diabetic neuropathy associated with type 2 diabetes mellitus 12/01/2013  . Hx of BKA 09/28/2013  . Cellulitis 09/19/2013  . Abdominal pain, unspecified site 09/06/2013  . Noncompliance with medications 07/13/2010  . OTHER SPEC TYPES SCHIZOPHRENIA UNSPEC CONDITION 05/24/2009  . Obesity, unspecified 06/09/2007  . DM (diabetes mellitus), type 2, uncontrolled 05/02/2006  . HYPERTENSION, BENIGN SYSTEMIC 05/02/2006    Jamey Reas PT, DPT PT Specializing in Prosthetics 03/25/2014, 7:19 PM  [facility] [address] [place], [place], [address] Phone: [phone]   Fax:  [phone]

## 2014-03-31 ENCOUNTER — Ambulatory Visit: Payer: Medicaid Other | Admitting: Physical Therapy

## 2014-03-31 ENCOUNTER — Encounter: Payer: Self-pay | Admitting: Physical Therapy

## 2014-03-31 DIAGNOSIS — R269 Unspecified abnormalities of gait and mobility: Secondary | ICD-10-CM

## 2014-03-31 DIAGNOSIS — R6889 Other general symptoms and signs: Secondary | ICD-10-CM

## 2014-03-31 DIAGNOSIS — Z89511 Acquired absence of right leg below knee: Secondary | ICD-10-CM

## 2014-03-31 NOTE — Therapy (Signed)
[facility] [address] [place], [place], [address] Phone: [phone]   Fax:  [phone]  Physical Therapy Treatment  Patient Details  Name: [name] MRN: [id number] Date of Birth: 1955-03-31 Referring Provider:  Leone Brand, MD  Encounter Date: 03/31/2014      PT End of Session - 03/31/14 1400    Visit Number 3   Number of Visits 9   Authorization Type Medicaid   Authorization Time Period 1/11-05/09/14    Authorization - Visit Number 2   Authorization - Number of Visits 8   PT Start Time 1400   PT Stop Time 1445   PT Time Calculation (min) 45 min   Equipment Utilized During Treatment Gait belt   Activity Tolerance Patient tolerated treatment well   Behavior During Therapy [facility] for tasks assessed/performed      Past Medical History  Diagnosis Date  . Diabetes mellitus   . Hypertension     Past Surgical History  Procedure Laterality Date  . I&d extremity Right 09/24/2013    Procedure: IRRIGATION AND DEBRIDEMENT RIGHT FOOT ULCER;  Surgeon: Renette Butters, MD;  Location: [facility];  Service: Orthopedics;  Laterality: Right;  . I&d extremity Right 09/26/2013    Procedure: Repeat IRRIGATION AND DEBRIDEMENT Right Foot Ulcer;  Surgeon: Rozanna Box, MD;  Location: [facility];  Service: Orthopedics;  Laterality: Right;  . Amputation Right 09/26/2013    Procedure: AMPUTATION BELOW KNEE;  Surgeon: Rozanna Box, MD;  Location: [facility];  Service: Orthopedics;  Laterality: Right;    There were no vitals taken for this visit.  Visit Diagnosis:  Abnormality of gait  Activity intolerance  Status post below knee amputation of right lower extremity      Subjective Assessment - 03/31/14 1411    Symptoms wearing prosthesis all awake hours without issues. Drying limb /liner 1x/day. Using coco butter in morning.   Currently in Pain? No/denies                    [facility] Adult PT Treatment/Exercise -  03/31/14 1400    Ambulation/Gait   Ambulation/Gait Yes   Ambulation/Gait Assistance 4: Min assist;4: Min guard   Ambulation Distance (Feet) 500 Feet  1x, 100' on grass,    Assistive device Straight cane;Prosthesis   Gait Pattern Step-through pattern;Decreased stance time - right;Decreased step length - left   Ambulation Surface Level;Unlevel;Indoor;Outdoor;Grass;Gravel;Paved   Stairs Yes   Stairs Assistance 5: Supervision   Stairs Assistance Details (indicate cue type and reason) demo & verbal cues on technique   Stair Management Technique Alternating pattern;Two rails  prosthesis   Number of Stairs 4  3 reps   Ramp 5: Supervision  single point cane & prosthesis   Curb 5: Supervision  single point cane & prosthesis   Dynamic Standing Balance   Dynamic Standing - Balance Support --   Dynamic Standing - Level of Assistance --   Dynamic Standing - Balance Activities --   High Level Balance   High Level Balance Activities Side stepping;Braiding;Backward walking;Tandem walking;Marching forwards;Marching backwards  at counter with support, PT cueing & demo   Prosthetics   Current prosthetic wear tolerance (days/week)  7 days/wk   Current prosthetic wear tolerance (#hours/day)  wearing all awake hours no skin issues   Edema mild, non-pitting   Residual limb condition  no wounds now, completely healed   Education Provided Residual limb care;Correct ply sock adjustment  use of lotion at  night only   Person(s) Educated Patient   Education Method Explanation;Demonstration   Education Method Verbalized understanding;Tactile cues required;Verbal cues required   Donning Prosthesis Supervision  cues on pin alignment   Doffing Prosthesis Supervision     PT demonstrated proper lifting with prosthesis: Patient able to lift computer mouse from floor and 15# box from upper & lower handles with verbal cues.           PT Education - 03/31/14 1400    Education Details see prosthetic  instructions, lifting   Person(s) Educated Patient   Methods Explanation;Demonstration;Tactile cues;Verbal cues   Comprehension Verbalized understanding;Returned demonstration          PT Short Term Goals - 03/11/14 1400    PT SHORT TERM GOAL #1   Title verbalizes proper prosthetic care except cues how to adjust ply socks. (Target Date: 5th visit)   Baseline Dependent in prosthetic care & has wound on residual limb.   Time 4   Period Weeks   Status New   PT SHORT TERM GOAL #2   Title tolerates wear of prosthesis >80% of awake hours & wound is healing. (Target Date: 5th visit)   Baseline Patient is wearing all awake hours but skin is too moist from sweat & wounds present with signs of over moisture. He increased wear too rapidly.   Time 4   Period Weeks   Status New   PT SHORT TERM GOAL #3   Title ambulates 500' with prosthesis only with cues for deviations. (Target Date: 5th visit)   Baseline ambulates 100' with prosthesis only with minimal assist.    Time 4   Period Weeks   Status New   PT SHORT TERM GOAL #4   Title negotiate ramps & curbs with prosthesis only with supervision. (Target Date: 5th visit)   Baseline patient is dependent in ambulating with prosthesis on barriers.   Time 4   Period Weeks   Status New           PT Long Term Goals - 03/11/14 1400    PT LONG TERM GOAL #1   Title Patient verbalizes proper prosthetic care. (Target Date: 9th visit)   Baseline Patient is dependent in prosthetic care & has wound on residual limb.   Time 8   Period Weeks   Status New   PT LONG TERM GOAL #2   Title Patient tolerates wear of prosthesis >90% of all awake hours with skin issues or pain /discomfort. (9th visit)   Baseline Patient progressed wear too rapidly with skin too moist from sweat and wounds on residual limb.   Time 8   Period Weeks   Status New   PT LONG TERM GOAL #3   Title ambulates >2000' with prosthesis only independently. (9th visit)   Baseline  ambulates 100' with prosthesis only with minimal assist.   Time 8   Period Weeks   Status New   PT LONG TERM GOAL #4   Title Patient negotiates ramp, curb, stairs & uneven surfaces like grass with prosthesis only independent. (Target Date 9th visit)   Baseline Patient is dependent in negotiating barriers with a prosthesis.   Time 8   Period Weeks   Status New   PT LONG TERM GOAL #5   Title Berg Balance >/= 45/56 (Target Date 9th visit)   Baseline Berg Balance 37/56 (<45/56 indicates Fall Risk)   Time 8   Period Weeks   Status New   Additional Long Term Goals  Additional Long Term Goals --   PT LONG TERM GOAL #6   Title Timed Up & Go with prosthesis only <13.5 seconds (Target Date 9th visit)   Baseline Timed Up & Go with prosthesis only 17.47 seconds (>13.5 seconds indicates fall risk)   Time 8   Period Weeks   Status New   PT LONG TERM GOAL #7   Title lifts 20# and carries >200' with prosthesis only independently. (Target Date 9th visit)   Baseline Patient is unable to lift or carry items with prosthesis.   Time 8   Period Weeks   Status New   PT LONG TERM GOAL #8   Title Functional Status score on FOTO (self-reported mobility) improves by >10 points. (Target Date 9th visit)   Baseline Patient functional status score of 47   Time 8   Period Weeks   Status New               Plan - 03/31/14 1400    Clinical Impression Statement He appears to understand better proper use of lotions and adjusting ply socks. Patient improved lifting items.   Pt will benefit from skilled therapeutic intervention in order to improve on the following deficits Abnormal gait;Decreased activity tolerance;Decreased balance;Decreased endurance;Decreased mobility;Decreased skin integrity;Other (comment)  prosthetic dependency   Rehab Potential Excellent   PT Frequency 1x / week   PT Duration 8 weeks   PT Treatment/Interventions ADLs/Self Care Home Management;Gait training;Stair  training;Functional mobility training;Therapeutic activities;Therapeutic exercise;Balance training;Neuromuscular re-education;Patient/family education;Other (comment)  prosthetic training   PT Next Visit Plan review prosthetic care, prosthetic gait training including barriers, balance, lifting & carrying   Consulted and Agree with Plan of Care Patient        Problem List Patient Active Problem List   Diagnosis Date Noted  . Diabetic neuropathy associated with type 2 diabetes mellitus 12/01/2013  . Hx of BKA 09/28/2013  . Cellulitis 09/19/2013  . Abdominal pain, unspecified site 09/06/2013  . Noncompliance with medications 07/13/2010  . OTHER SPEC TYPES SCHIZOPHRENIA UNSPEC CONDITION 05/24/2009  . Obesity, unspecified 06/09/2007  . DM (diabetes mellitus), type 2, uncontrolled 05/02/2006  . HYPERTENSION, BENIGN SYSTEMIC 05/02/2006    Jamey Reas PT, DPT PT Specializing in Prosthetics 03/31/2014, 4:03 PM  [facility] [address] [place], [place], [address] Phone: [phone]   Fax:  [phone]

## 2014-04-07 ENCOUNTER — Encounter: Payer: Self-pay | Admitting: Physical Therapy

## 2014-04-07 ENCOUNTER — Ambulatory Visit: Payer: Medicaid Other | Attending: Orthopedic Surgery | Admitting: Physical Therapy

## 2014-04-07 DIAGNOSIS — R6889 Other general symptoms and signs: Secondary | ICD-10-CM | POA: Insufficient documentation

## 2014-04-07 DIAGNOSIS — R269 Unspecified abnormalities of gait and mobility: Secondary | ICD-10-CM | POA: Diagnosis present

## 2014-04-07 DIAGNOSIS — Z89511 Acquired absence of right leg below knee: Secondary | ICD-10-CM

## 2014-04-09 NOTE — Therapy (Signed)
[facility] [address] [place], [place], [address] Phone: [phone]   Fax:  [phone]  Physical Therapy Treatment  Patient Details  Name: [name] MRN: [id number] Date of Birth: Jul 16, 1955 Referring Provider:  Leone Brand, MD  Encounter Date: 04/07/2014    Past Medical History  Diagnosis Date  . Diabetes mellitus   . Hypertension     Past Surgical History  Procedure Laterality Date  . I&d extremity Right 09/24/2013    Procedure: IRRIGATION AND DEBRIDEMENT RIGHT FOOT ULCER;  Surgeon: Renette Butters, MD;  Location: [facility];  Service: Orthopedics;  Laterality: Right;  . I&d extremity Right 09/26/2013    Procedure: Repeat IRRIGATION AND DEBRIDEMENT Right Foot Ulcer;  Surgeon: Rozanna Box, MD;  Location: [facility];  Service: Orthopedics;  Laterality: Right;  . Amputation Right 09/26/2013    Procedure: AMPUTATION BELOW KNEE;  Surgeon: Rozanna Box, MD;  Location: [facility];  Service: Orthopedics;  Laterality: Right;    There were no vitals taken for this visit.  Visit Diagnosis:  Abnormality of gait  Activity intolerance  Status post below knee amputation of right lower extremity     04/07/14 0856  PT Visits / Re-Eval  Visit Number 4  Number of Visits 9  Authorization  Authorization Type Medicaid  Authorization Time Period 1/11-05/09/14   Authorization - Visit Number 3  Authorization - Number of Visits 8  PT Time Calculation  PT Start Time 1316  PT Stop Time 1400  PT Time Calculation (min) 44 min  PT - End of Session  Equipment Utilized During Treatment Gait belt  Activity Tolerance Patient tolerated treatment well  Behavior During Therapy WFL for tasks assessed/performed      04/07/14 1322  Ambulation/Gait  Ambulation/Gait Assistance 5: Supervision  Ambulation/Gait Assistance Details cues on equal step length with use of cane, cues on posture and to slow down for safety with gait without  AD  Ambulation Distance (Feet) 430 Feet (x1 with cane, 120 ft none)  Assistive device Straight cane;Prosthesis;None  Gait Pattern Step-through pattern  Ambulation Surface Level;Indoor  Stairs Assistance 5: Supervision  Stairs Assistance Details (indicate cue type and reason) cues on foot placement for reciprocal pattern descending stairs and posture on stairs            Stair Management Technique One rail Right;With cane;Alternating pattern;Forwards (prosthesis, cane)  Ramp 5: Supervision (prosthesis, cane)  Ramp Details (indicate cue type and reason) cues on posture and techique  Curb 5: Supervision (prosthesis, cane)  Curb Details (indicate cue type and reason) cues on sequence  High Level Balance  High Level Balance Activities Side stepping;Backward walking;Tandem walking;Marching forwards (tandem both forward/backwards)  High Level Balance Comments with no to 1 UE support on counter top and min assist. cues on posture and technique.                      Lumbar Exercises: Aerobic  Stationary Bike Scifit x4 extremities level 3.0 x 8 minutes with goal RPM >/= 60 for strengthening and activity tolerance  Prosthetics  Current prosthetic wear tolerance (days/week)  7 days/wk  Current prosthetic wear tolerance (#hours/day)  wearing all awake hours (drying 1x mid day)  Edema none  Residual limb condition  skin checked, no wounds. some reddness at tibial area.               Education Provided Correct ply sock adjustment;Proper wear schedule/adjustment;Residual limb care  Person(s)  Educated Patient  Education Method Explanation;Demonstration  Education Method Verbalized understanding;Verbal cues required;Needs further instruction  Donning Prosthesis 5  Doffing Prosthesis 6         PT Short Term Goals - 03/11/14 1400    PT SHORT TERM GOAL #1   Title verbalizes proper prosthetic care except cues how to adjust ply socks. (Target Date: 5th visit)   Baseline Dependent in prosthetic  care & has wound on residual limb.   Time 4   Period Weeks   Status New   PT SHORT TERM GOAL #2   Title tolerates wear of prosthesis >80% of awake hours & wound is healing. (Target Date: 5th visit)   Baseline Patient is wearing all awake hours but skin is too moist from sweat & wounds present with signs of over moisture. He increased wear too rapidly.   Time 4   Period Weeks   Status New   PT SHORT TERM GOAL #3   Title ambulates 500' with prosthesis only with cues for deviations. (Target Date: 5th visit)   Baseline ambulates 100' with prosthesis only with minimal assist.    Time 4   Period Weeks   Status New   PT SHORT TERM GOAL #4   Title negotiate ramps & curbs with prosthesis only with supervision. (Target Date: 5th visit)   Baseline patient is dependent in ambulating with prosthesis on barriers.   Time 4   Period Weeks   Status New           PT Long Term Goals - 03/11/14 1400    PT LONG TERM GOAL #1   Title Patient verbalizes proper prosthetic care. (Target Date: 9th visit)   Baseline Patient is dependent in prosthetic care & has wound on residual limb.   Time 8   Period Weeks   Status New   PT LONG TERM GOAL #2   Title Patient tolerates wear of prosthesis >90% of all awake hours with skin issues or pain /discomfort. (9th visit)   Baseline Patient progressed wear too rapidly with skin too moist from sweat and wounds on residual limb.   Time 8   Period Weeks   Status New   PT LONG TERM GOAL #3   Title ambulates >2000' with prosthesis only independently. (9th visit)   Baseline ambulates 100' with prosthesis only with minimal assist.   Time 8   Period Weeks   Status New   PT LONG TERM GOAL #4   Title Patient negotiates ramp, curb, stairs & uneven surfaces like grass with prosthesis only independent. (Target Date 9th visit)   Baseline Patient is dependent in negotiating barriers with a prosthesis.   Time 8   Period Weeks   Status New   PT LONG TERM GOAL #5    Title Berg Balance >/= 45/56 (Target Date 9th visit)   Baseline Berg Balance 37/56 (<45/56 indicates Fall Risk)   Time 8   Period Weeks   Status New   Additional Long Term Goals   Additional Long Term Goals --   PT LONG TERM GOAL #6   Title Timed Up & Go with prosthesis only <13.5 seconds (Target Date 9th visit)   Baseline Timed Up & Go with prosthesis only 17.47 seconds (>13.5 seconds indicates fall risk)   Time 8   Period Weeks   Status New   PT LONG TERM GOAL #7   Title lifts 20# and carries >200' with prosthesis only independently. (Target Date 9th visit)   Baseline Patient  is unable to lift or carry items with prosthesis.   Time 8   Period Weeks   Status New   PT LONG TERM GOAL #8   Title Functional Status score on FOTO (self-reported mobility) improves by >10 points. (Target Date 9th visit)   Baseline Patient functional status score of 47   Time 8   Period Weeks   Status New           Plan - 04/09/14 0857    Clinical Impression Statement Pt making great progress with mobility and balance. Progressing toward goals.   Pt will benefit from skilled therapeutic intervention in order to improve on the following deficits Abnormal gait;Decreased activity tolerance;Decreased balance;Decreased endurance;Decreased mobility;Decreased skin integrity;Other (comment)  prosthetic dependency   Rehab Potential Excellent   PT Frequency 1x / week   PT Duration 8 weeks   PT Treatment/Interventions ADLs/Self Care Home Management;Gait training;Stair training;Functional mobility training;Therapeutic activities;Therapeutic exercise;Balance training;Neuromuscular re-education;Patient/family education;Other (comment)  prosthetic training   PT Next Visit Plan review prosthetic care, prosthetic gait training including barriers, balance, lifting & carrying. Assess STG's next session.   Consulted and Agree with Plan of Care Patient        Problem List Patient Active Problem List   Diagnosis  Date Noted  . Diabetic neuropathy associated with type 2 diabetes mellitus 12/01/2013  . Hx of BKA 09/28/2013  . Cellulitis 09/19/2013  . Abdominal pain, unspecified site 09/06/2013  . Noncompliance with medications 07/13/2010  . OTHER SPEC TYPES SCHIZOPHRENIA UNSPEC CONDITION 05/24/2009  . Obesity, unspecified 06/09/2007  . DM (diabetes mellitus), type 2, uncontrolled 05/02/2006  . HYPERTENSION, BENIGN SYSTEMIC 05/02/2006    Willow Ora 04/09/2014, 8:58 AM  Willow Ora, PTA, [facility] [address], [place], [place] [address] [phone] 04/09/2014, 8:59 AM

## 2014-04-14 ENCOUNTER — Encounter: Payer: Self-pay | Admitting: Physical Therapy

## 2014-04-14 ENCOUNTER — Ambulatory Visit: Payer: Medicaid Other | Admitting: Physical Therapy

## 2014-04-14 DIAGNOSIS — R269 Unspecified abnormalities of gait and mobility: Secondary | ICD-10-CM

## 2014-04-14 DIAGNOSIS — R6889 Other general symptoms and signs: Secondary | ICD-10-CM

## 2014-04-14 DIAGNOSIS — Z89511 Acquired absence of right leg below knee: Secondary | ICD-10-CM

## 2014-04-14 NOTE — Therapy (Signed)
[facility] [address] [place], [place], [address] Phone: [phone]   Fax:  [phone]  Physical Therapy Treatment  Patient Details  Name: [name] MRN: [id number] Date of Birth: 01-16-1956 Referring Provider:  Leone Brand, MD  Encounter Date: 04/14/2014      PT End of Session - 04/14/14 1408    Visit Number 5   Number of Visits 9   Authorization Type Medicaid   Authorization Time Period 1/11-05/09/14    Authorization - Visit Number 4   Authorization - Number of Visits 8   PT Start Time 2841   PT Stop Time 1355   PT Time Calculation (min) 40 min   Equipment Utilized During Treatment Gait belt   Activity Tolerance Patient tolerated treatment well   Behavior During Therapy [facility] for tasks assessed/performed      Past Medical History  Diagnosis Date  . Diabetes mellitus   . Hypertension     Past Surgical History  Procedure Laterality Date  . I&d extremity Right 09/24/2013    Procedure: IRRIGATION AND DEBRIDEMENT RIGHT FOOT ULCER;  Surgeon: Renette Butters, MD;  Location: [facility];  Service: Orthopedics;  Laterality: Right;  . I&d extremity Right 09/26/2013    Procedure: Repeat IRRIGATION AND DEBRIDEMENT Right Foot Ulcer;  Surgeon: Rozanna Box, MD;  Location: [facility];  Service: Orthopedics;  Laterality: Right;  . Amputation Right 09/26/2013    Procedure: AMPUTATION BELOW KNEE;  Surgeon: Rozanna Box, MD;  Location: [facility] [facility];  Service: Orthopedics;  Laterality: Right;    There were no vitals taken for this visit.  Visit Diagnosis:  Abnormality of gait  Activity intolerance  Status post below knee amputation of right lower extremity      Subjective Assessment - 04/14/14 1323    Symptoms No issues. no falls. He reports wearing "all day" takes it off for 57min Get up ~9am donnes prosthesis, takes shower ~11 and redonnes prosthesis, removes ~1am when going to bed.   Currently in Pain? No/denies      Prosthetic Training:  PT instructed in drying limb/liner q 4hours &prn. PT instructed in signs of sweating with need to dry. Patient verbalized understanding. Incision has 71mm X 15 mm granulated healing blister. Color changes indicating too wet.  Patient verbalized understanding. Patient ambulated 500' with prosthesis only working on scanning right /left, up/down, diagonals with tactile cues to maintain path & pace. Patient negotiated stairs with 2 rails reciprocally with cues on technique and ramp/curb with prosthesis only with cues. Patient lifted /carried 15# box with PT demo/ instructing with verbal cues on techniques. Patient ambulated with prosthesis only increasing velocity with cues on longer stride length along with faster pace. PT demo /instructed stepping over obstacles: forward facing over 5" high X 4" wide beam with contact assist /verbal cues and sideways over 8" high X 4" wide with contact assist/verbal cues.  Patient's prosthesis appears to be 1/2" short. PT contacted prosthetist who will see patient to correct.                        PT Education - 04/14/14 1407    Education provided Yes   Education Details scanning while walking, increasing pace, lifting, stepping over obstacles, sweating & drying limb/liner   Person(s) Educated Patient   Methods Explanation;Demonstration;Tactile cues;Verbal cues   Comprehension Verbalized understanding;Returned demonstration;Verbal cues required;Tactile cues required;Need further instruction  PT Short Term Goals - 04/14/14 1409    PT SHORT TERM GOAL #1   Title verbalizes proper prosthetic care except cues how to adjust ply socks. (Target Date: 5th visit)   Baseline 04/14/14 MET    Time 4   Period Weeks   Status Achieved   PT SHORT TERM GOAL #2   Title tolerates wear of prosthesis >80% of awake hours & wound is healing. (Target Date: 5th visit)   Baseline MET 04/14/14 wound is healing with wear >80% of  awake hours.   Time 4   Period Weeks   Status Achieved   PT SHORT TERM GOAL #3   Title ambulates 500' with prosthesis only with cues for deviations. (Target Date: 5th visit)   Baseline MET 04/14/14    Time 4   Period Weeks   Status Achieved   PT SHORT TERM GOAL #4   Title negotiate ramps & curbs with prosthesis only with supervision. (Target Date: 5th visit)   Baseline MET 04/14/14   Time 4   Period Weeks   Status New           PT Long Term Goals - 03/11/14 1400    PT LONG TERM GOAL #1   Title Patient verbalizes proper prosthetic care. (Target Date: 9th visit)   Baseline Patient is dependent in prosthetic care & has wound on residual limb.   Time 8   Period Weeks   Status New   PT LONG TERM GOAL #2   Title Patient tolerates wear of prosthesis >90% of all awake hours with skin issues or pain /discomfort. (9th visit)   Baseline Patient progressed wear too rapidly with skin too moist from sweat and wounds on residual limb.   Time 8   Period Weeks   Status New   PT LONG TERM GOAL #3   Title ambulates >2000' with prosthesis only independently. (9th visit)   Baseline ambulates 100' with prosthesis only with minimal assist.   Time 8   Period Weeks   Status New   PT LONG TERM GOAL #4   Title Patient negotiates ramp, curb, stairs & uneven surfaces like grass with prosthesis only independent. (Target Date 9th visit)   Baseline Patient is dependent in negotiating barriers with a prosthesis.   Time 8   Period Weeks   Status New   PT LONG TERM GOAL #5   Title Berg Balance >/= 45/56 (Target Date 9th visit)   Baseline Berg Balance 37/56 (<45/56 indicates Fall Risk)   Time 8   Period Weeks   Status New   Additional Long Term Goals   Additional Long Term Goals --   PT LONG TERM GOAL #6   Title Timed Up & Go with prosthesis only <13.5 seconds (Target Date 9th visit)   Baseline Timed Up & Go with prosthesis only 17.47 seconds (>13.5 seconds indicates fall risk)   Time 8   Period  Weeks   Status New   PT LONG TERM GOAL #7   Title lifts 20# and carries >200' with prosthesis only independently. (Target Date 9th visit)   Baseline Patient is unable to lift or carry items with prosthesis.   Time 8   Period Weeks   Status New   PT LONG TERM GOAL #8   Title Functional Status score on FOTO (self-reported mobility) improves by >10 points. (Target Date 9th visit)   Baseline Patient functional status score of 47   Time 8   Period Weeks   Status  New               Plan - 04/14/14 1400    Clinical Impression Statement Patient met 4/4 STGs. His wound is healing. He appears to understand better issues with not drying limb/liner if sweating. Patient improved ability to scan while walking, increase speed, step over obstacles, and lift /carry with instruction & practice.   Pt will benefit from skilled therapeutic intervention in order to improve on the following deficits Abnormal gait;Decreased activity tolerance;Decreased balance;Decreased endurance;Decreased mobility;Decreased skin integrity;Other (comment)  prosthetic dependency   Rehab Potential Excellent   PT Frequency 1x / week   PT Duration 8 weeks   PT Treatment/Interventions ADLs/Self Care Home Management;Gait training;Stair training;Functional mobility training;Therapeutic activities;Therapeutic exercise;Balance training;Neuromuscular re-education;Patient/family education;Other (comment)  prosthetic training   PT Next Visit Plan work towards [place].   Consulted and Agree with Plan of Care Patient        Problem List Patient Active Problem List   Diagnosis Date Noted  . Diabetic neuropathy associated with type 2 diabetes mellitus 12/01/2013  . Hx of BKA 09/28/2013  . Cellulitis 09/19/2013  . Abdominal pain, unspecified site 09/06/2013  . Noncompliance with medications 07/13/2010  . OTHER SPEC TYPES SCHIZOPHRENIA UNSPEC CONDITION 05/24/2009  . Obesity, unspecified 06/09/2007  . DM (diabetes mellitus), type  2, uncontrolled 05/02/2006  . HYPERTENSION, BENIGN SYSTEMIC 05/02/2006    Jamey Reas PT, DPT 04/14/2014, 2:14 PM  [facility] [address] [place], [place], [address] Phone: [phone]   Fax:  [phone]

## 2014-04-21 ENCOUNTER — Ambulatory Visit: Payer: Medicaid Other | Admitting: Physical Therapy

## 2014-04-21 ENCOUNTER — Encounter: Payer: Self-pay | Admitting: Physical Therapy

## 2014-04-21 DIAGNOSIS — Z89511 Acquired absence of right leg below knee: Secondary | ICD-10-CM

## 2014-04-21 DIAGNOSIS — R6889 Other general symptoms and signs: Secondary | ICD-10-CM

## 2014-04-21 DIAGNOSIS — R269 Unspecified abnormalities of gait and mobility: Secondary | ICD-10-CM | POA: Diagnosis not present

## 2014-04-22 NOTE — Therapy (Signed)
[facility] [address] [place], [place], [address] Phone: [phone]   Fax:  [phone]  Physical Therapy Treatment  Patient Details  Name: [name] MRN: [id number] Date of Birth: 01-Oct-1955 Referring Provider:  Leone Brand, MD  Encounter Date: 04/21/2014      PT End of Session - 04/21/14 1323    Visit Number 6   Number of Visits 9   Authorization Type Medicaid   Authorization Time Period 1/11-05/09/14    Authorization - Visit Number 5   Authorization - Number of Visits 8   PT Start Time 1320   PT Stop Time 1400   PT Time Calculation (min) 40 min   Equipment Utilized During Treatment Gait belt   Activity Tolerance Patient tolerated treatment well   Behavior During Therapy [facility] for tasks assessed/performed      Past Medical History  Diagnosis Date  . Diabetes mellitus   . Hypertension     Past Surgical History  Procedure Laterality Date  . I&d extremity Right 09/24/2013    Procedure: IRRIGATION AND DEBRIDEMENT RIGHT FOOT ULCER;  Surgeon: Renette Butters, MD;  Location: [facility];  Service: Orthopedics;  Laterality: Right;  . I&d extremity Right 09/26/2013    Procedure: Repeat IRRIGATION AND DEBRIDEMENT Right Foot Ulcer;  Surgeon: Rozanna Box, MD;  Location: [place];  Service: Orthopedics;  Laterality: Right;  . Amputation Right 09/26/2013    Procedure: AMPUTATION BELOW KNEE;  Surgeon: Rozanna Box, MD;  Location: [facility];  Service: Orthopedics;  Laterality: Right;    There were no vitals taken for this visit.  Visit Diagnosis:  Abnormality of gait  Activity intolerance  Status post below knee amputation of right lower extremity      Subjective Assessment - 04/21/14 1323    Symptoms No new complaints. No falls or pain to report.   Currently in Pain? No/denies            [facility] Adult PT Treatment/Exercise - 04/21/14 1324    Ambulation/Gait   Ambulation/Gait Assistance 5: Supervision    Ambulation/Gait Assistance Details cues on maintaing path/pace while scanning enviroment (up/down, left/right). cues on posture and stride length with gait on uneven paved surfaces                     Ambulation Distance (Feet) 630 Feet  x1, 600 x1 outside/inside   Assistive device Prosthesis   Gait Pattern Step-through pattern   Ambulation Surface Level;Indoor;Outdoor;Paved   Stairs Assistance 5: Supervision   Stairs Assistance Details (indicate cue type and reason) cues on prosthetic foot placement with descending stairs   Stair Management Technique Two rails;Alternating pattern;Forwards   Number of Stairs 4  x 3 reps   Ramp 5: Supervision  prosthesis only   Ramp Details (indicate cue type and reason) cues on step/stride length   Curb 5: Supervision  prosthesis only   Curb Details (indicate cue type and reason) cues on posture and prosthetic placement   Dynamic Standing Balance   Dynamic Standing - Balance Support No upper extremity supported;During functional activity   Dynamic Standing - Level of Assistance 4: Min assist   Dynamic Standing - Balance Activities Compliant surfaces;Alternating  foot traps   Dynamic Standing - Comments standing by mat without UE support: alternating toe forward taps, cross taps to both 4 and 6 inch boxes x 10 each bil feet; seated with feet across blue foam beam sit/stands x 10 reps with minimal  UE assist and cues on full standing posture. cues on weight shift and stance stability with both activities.                                Lumbar Exercises: Aerobic   Stationary Bike Scifit x4 extremities level 3.4 x 8 minutes with goal RPM >/= 70 for strengthening and activity tolerance   Prosthetics   Current prosthetic wear tolerance (days/week)  7 days/wk   Current prosthetic wear tolerance (#hours/day)  wearing all awake hours  drying 1 x half way   Edema none   Residual limb condition  Skin checked, blister area is healing. Saw Dr Sharol Given yesterday who  said it looked good as well.   Education Provided Correct ply sock adjustment;Residual limb care   Person(s) Educated Patient   Education Method Explanation   Education Method Verbalized understanding   Donning Prosthesis Modified independent (device/increased time)   Doffing Prosthesis Modified independent (device/increased time)            PT Short Term Goals - 04/14/14 1409    PT SHORT TERM GOAL #1   Title verbalizes proper prosthetic care except cues how to adjust ply socks. (Target Date: 5th visit)   Baseline 04/14/14 MET    Time 4   Period Weeks   Status Achieved   PT SHORT TERM GOAL #2   Title tolerates wear of prosthesis >80% of awake hours & wound is healing. (Target Date: 5th visit)   Baseline MET 04/14/14 wound is healing with wear >80% of awake hours.   Time 4   Period Weeks   Status Achieved   PT SHORT TERM GOAL #3   Title ambulates 500' with prosthesis only with cues for deviations. (Target Date: 5th visit)   Baseline MET 04/14/14    Time 4   Period Weeks   Status Achieved   PT SHORT TERM GOAL #4   Title negotiate ramps & curbs with prosthesis only with supervision. (Target Date: 5th visit)   Baseline MET 04/14/14   Time 4   Period Weeks   Status New           PT Long Term Goals - 03/11/14 1400    PT LONG TERM GOAL #1   Title Patient verbalizes proper prosthetic care. (Target Date: 9th visit)   Baseline Patient is dependent in prosthetic care & has wound on residual limb.   Time 8   Period Weeks   Status New   PT LONG TERM GOAL #2   Title Patient tolerates wear of prosthesis >90% of all awake hours with skin issues or pain /discomfort. (9th visit)   Baseline Patient progressed wear too rapidly with skin too moist from sweat and wounds on residual limb.   Time 8   Period Weeks   Status New   PT LONG TERM GOAL #3   Title ambulates >2000' with prosthesis only independently. (9th visit)   Baseline ambulates 100' with prosthesis only with minimal  assist.   Time 8   Period Weeks   Status New   PT LONG TERM GOAL #4   Title Patient negotiates ramp, curb, stairs & uneven surfaces like grass with prosthesis only independent. (Target Date 9th visit)   Baseline Patient is dependent in negotiating barriers with a prosthesis.   Time 8   Period Weeks   Status New   PT LONG TERM GOAL #5   Title Oceanographer >/=  45/56 (Target Date 9th visit)   Baseline Berg Balance 37/56 (<45/56 indicates Fall Risk)   Time 8   Period Weeks   Status New   Additional Long Term Goals   Additional Long Term Goals --   PT LONG TERM GOAL #6   Title Timed Up & Go with prosthesis only <13.5 seconds (Target Date 9th visit)   Baseline Timed Up & Go with prosthesis only 17.47 seconds (>13.5 seconds indicates fall risk)   Time 8   Period Weeks   Status New   PT LONG TERM GOAL #7   Title lifts 20# and carries >200' with prosthesis only independently. (Target Date 9th visit)   Baseline Patient is unable to lift or carry items with prosthesis.   Time 8   Period Weeks   Status New   PT LONG TERM GOAL #8   Title Functional Status score on FOTO (self-reported mobility) improves by >10 points. (Target Date 9th visit)   Baseline Patient functional status score of 47   Time 8   Period Weeks   Status New           Plan - 04/21/14 1323    Clinical Impression Statement Pt making great progress toward goals.   Pt will benefit from skilled therapeutic intervention in order to improve on the following deficits Abnormal gait;Decreased activity tolerance;Decreased balance;Decreased endurance;Decreased mobility;Decreased skin integrity;Other (comment)  prosthetic dependency   Rehab Potential Excellent   PT Frequency 1x / week   PT Duration 8 weeks   PT Treatment/Interventions ADLs/Self Care Home Management;Gait training;Stair training;Functional mobility training;Therapeutic activities;Therapeutic exercise;Balance training;Neuromuscular re-education;Patient/family  education;Other (comment)  prosthetic training   PT Next Visit Plan work towards [place].   Consulted and Agree with Plan of Care Patient     Problem List Patient Active Problem List   Diagnosis Date Noted  . Diabetic neuropathy associated with type 2 diabetes mellitus 12/01/2013  . Hx of BKA 09/28/2013  . Cellulitis 09/19/2013  . Abdominal pain, unspecified site 09/06/2013  . Noncompliance with medications 07/13/2010  . OTHER SPEC TYPES SCHIZOPHRENIA UNSPEC CONDITION 05/24/2009  . Obesity, unspecified 06/09/2007  . DM (diabetes mellitus), type 2, uncontrolled 05/02/2006  . HYPERTENSION, BENIGN SYSTEMIC 05/02/2006    Willow Ora 04/22/2014, 1:10 PM  Willow Ora, PTA, [facility] [address] [place], [place] [address] [phone] 04/22/2014, 1:10 PM

## 2014-04-28 ENCOUNTER — Encounter: Payer: Self-pay | Admitting: Physical Therapy

## 2014-04-28 ENCOUNTER — Ambulatory Visit: Payer: Medicaid Other | Admitting: Physical Therapy

## 2014-04-28 DIAGNOSIS — R269 Unspecified abnormalities of gait and mobility: Secondary | ICD-10-CM | POA: Diagnosis not present

## 2014-04-28 DIAGNOSIS — R6889 Other general symptoms and signs: Secondary | ICD-10-CM

## 2014-04-28 DIAGNOSIS — Z89511 Acquired absence of right leg below knee: Secondary | ICD-10-CM

## 2014-04-28 NOTE — Therapy (Signed)
[facility] [address] [place], [place], [address] Phone: [phone]   Fax:  [phone]  Physical Therapy Treatment  Patient Details  Name: [name] MRN: [id number] Date of Birth: 29-Jan-1956 Referring Provider:  Leone Brand, MD  Encounter Date: 04/28/2014      PT End of Session - 04/28/14 1408    Visit Number 7   Number of Visits 9   Authorization Type Medicaid   Authorization Time Period 1/11-05/09/14    Authorization - Visit Number 6   Authorization - Number of Visits 8   PT Start Time 0947   PT Stop Time 1358   PT Time Calculation (min) 43 min   Equipment Utilized During Treatment Gait belt   Activity Tolerance Patient tolerated treatment well   Behavior During Therapy [facility] for tasks assessed/performed      Past Medical History  Diagnosis Date  . Diabetes mellitus   . Hypertension     Past Surgical History  Procedure Laterality Date  . I&d extremity Right 09/24/2013    Procedure: IRRIGATION AND DEBRIDEMENT RIGHT FOOT ULCER;  Surgeon: Renette Butters, MD;  Location: [facility];  Service: Orthopedics;  Laterality: Right;  . I&d extremity Right 09/26/2013    Procedure: Repeat IRRIGATION AND DEBRIDEMENT Right Foot Ulcer;  Surgeon: Rozanna Box, MD;  Location: [facility];  Service: Orthopedics;  Laterality: Right;  . Amputation Right 09/26/2013    Procedure: AMPUTATION BELOW KNEE;  Surgeon: Rozanna Box, MD;  Location: [facility];  Service: Orthopedics;  Laterality: Right;    There were no vitals taken for this visit.  Visit Diagnosis:  No diagnosis found.      Subjective Assessment - 04/28/14 1324    Symptoms wearing prosthesis all awake hours and drying as needed. No falls.   Currently in Pain? No/denies     Prosthetic Training: PT reviewed lotion use & instructed in fall risk when prosthesis is off. Patient's limb has healed but incision line looks fragile with recently healed area. Patient  donned prosthesis correctly. Patient ambulated 5300' with prosthesis only including curbs and ramps with wind, negotiated obstacles and turned head to scan with supervision. PT instructed with demo drying feet / balance. Patient return demonstrated understanding.  See patient education                        PT Education - 04/28/14 1406    Education Details increasing activity level working back towards PFS but being careful to make sure he can get back with distances walked away from home   Person(s) Educated Patient   Methods Explanation   Comprehension Verbalized understanding          PT Short Term Goals - 04/14/14 1409    PT SHORT TERM GOAL #1   Title verbalizes proper prosthetic care except cues how to adjust ply socks. (Target Date: 5th visit)   Baseline 04/14/14 MET    Time 4   Period Weeks   Status Achieved   PT SHORT TERM GOAL #2   Title tolerates wear of prosthesis >80% of awake hours & wound is healing. (Target Date: 5th visit)   Baseline MET 04/14/14 wound is healing with wear >80% of awake hours.   Time 4   Period Weeks   Status Achieved   PT SHORT TERM GOAL #3   Title ambulates 500' with prosthesis only with cues for deviations. (Target Date: 5th visit)   Baseline  MET 04/14/14    Time 4   Period Weeks   Status Achieved   PT SHORT TERM GOAL #4   Title negotiate ramps & curbs with prosthesis only with supervision. (Target Date: 5th visit)   Baseline MET 04/14/14   Time 4   Period Weeks   Status New           PT Long Term Goals - 04/28/14 1410    PT LONG TERM GOAL #1   Title Patient verbalizes proper prosthetic care. (Target Date: 9th visit)   Baseline Patient is dependent in prosthetic care & has wound on residual limb.   Time 8   Period Weeks   Status On-going   PT LONG TERM GOAL #2   Title Patient tolerates wear of prosthesis >90% of all awake hours with skin issues or pain /discomfort. (9th visit)   Baseline Patient progressed wear  too rapidly with skin too moist from sweat and wounds on residual limb.   Time 8   Period Weeks   Status On-going   PT LONG TERM GOAL #3   Title ambulates >1 mile with prosthesis only independently. (9th visit)   Baseline ambulates 100' with prosthesis only with minimal assist.   Time 8   Period Weeks   Status Revised   PT LONG TERM GOAL #4   Title Patient negotiates ramp, curb, stairs & uneven surfaces like grass with prosthesis only independent. (Target Date 9th visit)   Baseline Patient is dependent in negotiating barriers with a prosthesis.   Time 8   Period Weeks   Status On-going   PT LONG TERM GOAL #5   Title Oceanographer >/= 52/56 (Target Date 9th visit)   Baseline Berg Balance 37/56 (<45/56 indicates Fall Risk)   Time 8   Period Weeks   Status Revised   PT LONG TERM GOAL #6   Title Timed Up & Go with prosthesis only <13.5 seconds (Target Date 9th visit)   Baseline Timed Up & Go with prosthesis only 17.47 seconds (>13.5 seconds indicates fall risk)   Time 8   Period Weeks   Status On-going   PT LONG TERM GOAL #7   Title lifts 20# and carries >200' with prosthesis only independently. (Target Date 9th visit)   Baseline Patient is unable to lift or carry items with prosthesis.   Time 8   Period Weeks   Status On-going   PT LONG TERM GOAL #8   Title Functional Status score on FOTO (self-reported mobility) improves by >10 points. (Target Date 9th visit)   Baseline Patient functional status score of 47   Time 8   Period Weeks   Status On-going               Plan - 04/28/14 1409    Clinical Impression Statement patient was able to increase distance ambulating without pain. He reports he walked 1-2 miles to go places and took bus only if it was further than that.   Pt will benefit from skilled therapeutic intervention in order to improve on the following deficits Abnormal gait;Decreased activity tolerance;Decreased balance;Decreased endurance;Decreased  mobility;Decreased skin integrity;Other (comment)  prosthetic dependency   Rehab Potential Excellent   PT Frequency 1x / week   PT Duration 8 weeks   PT Treatment/Interventions ADLs/Self Care Home Management;Gait training;Stair training;Functional mobility training;Therapeutic activities;Therapeutic exercise;Balance training;Neuromuscular re-education;Patient/family education;Other (comment)  prosthetic training   PT Next Visit Plan work towards [place].   Consulted and Agree with Plan of Care Patient  Problem List Patient Active Problem List   Diagnosis Date Noted  . Diabetic neuropathy associated with type 2 diabetes mellitus 12/01/2013  . Hx of BKA 09/28/2013  . Cellulitis 09/19/2013  . Abdominal pain, unspecified site 09/06/2013  . Noncompliance with medications 07/13/2010  . OTHER SPEC TYPES SCHIZOPHRENIA UNSPEC CONDITION 05/24/2009  . Obesity, unspecified 06/09/2007  . DM (diabetes mellitus), type 2, uncontrolled 05/02/2006  . HYPERTENSION, BENIGN SYSTEMIC 05/02/2006    Jamey Reas 04/28/2014, 2:13 PM  [facility] [address] [place], [place], [address] Phone: [phone]   Fax:  [phone]

## 2014-05-05 ENCOUNTER — Encounter: Payer: Self-pay | Admitting: Physical Therapy

## 2014-05-05 ENCOUNTER — Ambulatory Visit: Payer: Medicaid Other | Attending: Orthopedic Surgery | Admitting: Physical Therapy

## 2014-05-05 DIAGNOSIS — Z89511 Acquired absence of right leg below knee: Secondary | ICD-10-CM | POA: Insufficient documentation

## 2014-05-05 DIAGNOSIS — R6889 Other general symptoms and signs: Secondary | ICD-10-CM | POA: Insufficient documentation

## 2014-05-05 DIAGNOSIS — R269 Unspecified abnormalities of gait and mobility: Secondary | ICD-10-CM | POA: Diagnosis present

## 2014-05-05 NOTE — Therapy (Signed)
[facility] [address] [place], [place], [address] Phone: [phone]   Fax:  [phone]  Physical Therapy Treatment  Patient Details  Name: [name] MRN: [id number] Date of Birth: [birth year] Referring Provider:  Leone Brand, MD  Encounter Date: 05/05/2014      PT End of Session - 05/05/14 1359    Visit Number 8   Number of Visits 9   Authorization Type Medicaid   Authorization Time Period 1/11-05/09/14    Authorization - Visit Number 7   Authorization - Number of Visits 8   PT Start Time 0712   PT Stop Time 1355   PT Time Calculation (min) 40 min   Equipment Utilized During Treatment Gait belt   Activity Tolerance Patient tolerated treatment well   Behavior During Therapy [facility] for tasks assessed/performed      Past Medical History  Diagnosis Date  . Diabetes mellitus   . Hypertension     Past Surgical History  Procedure Laterality Date  . I&d extremity Right 09/24/2013    Procedure: IRRIGATION AND DEBRIDEMENT RIGHT FOOT ULCER;  Surgeon: Renette Butters, MD;  Location: [place];  Service: Orthopedics;  Laterality: Right;  . I&d extremity Right 09/26/2013    Procedure: Repeat IRRIGATION AND DEBRIDEMENT Right Foot Ulcer;  Surgeon: Rozanna Box, MD;  Location: [place];  Service: Orthopedics;  Laterality: Right;  . Amputation Right 09/26/2013    Procedure: AMPUTATION BELOW KNEE;  Surgeon: Rozanna Box, MD;  Location: [facility];  Service: Orthopedics;  Laterality: Right;    There were no vitals taken for this visit.  Visit Diagnosis:  Abnormality of gait  Activity intolerance  Status post below knee amputation of right lower extremity      Subjective Assessment - 05/05/14 1320    Symptoms has increased his walking with no issues.   Currently in Pain? No/denies     Prosthetic Training: No skin changes. PT reviewed sweating & need to dry prn permanently. PT scheduled alignment check with  prosthetist on Mon, 3/7 at 11:00. Patient ambulated 1000' including scanning with prosthesis only. Patient negotiated stairs with 1 rail reciprocally, ramp & curb with prosthesis only with supervision. Patient lifted & carried 15# box with cues. PT demon /instructed in climbing A-frame ladder. Patient return demo understanding. Balance Activities: sidestepping, crossovers,braiding, tandem, marching,  Rocker Board- lateral & ant/post stabilization with head movements and wt shifts, crossways on foam beam- head movements, alt. Stepping forward & backward. PT demo stepping over obstacles. Patient performed forward facing over 5" beam and sideways over 8" beam with SBA.                       PT Education - 05/05/14 1358    Education provided Yes   Education Details climbing an A-frame ladder & stepping over obstacles.   Person(s) Educated Patient   Methods Explanation;Demonstration   Comprehension Verbalized understanding;Returned demonstration          PT Short Term Goals - 04/14/14 1409    PT SHORT TERM GOAL #1   Title verbalizes proper prosthetic care except cues how to adjust ply socks. (Target Date: 5th visit)   Baseline 04/14/14 MET    Time 4   Period Weeks   Status Achieved   PT SHORT TERM GOAL #2   Title tolerates wear of prosthesis >80% of awake hours & wound is healing. (Target Date: 5th visit)   Baseline MET 04/14/14 wound  is healing with wear >80% of awake hours.   Time 4   Period Weeks   Status Achieved   PT SHORT TERM GOAL #3   Title ambulates 500' with prosthesis only with cues for deviations. (Target Date: 5th visit)   Baseline MET 04/14/14    Time 4   Period Weeks   Status Achieved   PT SHORT TERM GOAL #4   Title negotiate ramps & curbs with prosthesis only with supervision. (Target Date: 5th visit)   Baseline MET 04/14/14   Time 4   Period Weeks   Status New           PT Long Term Goals - 04/28/14 1410    PT LONG TERM GOAL #1   Title  Patient verbalizes proper prosthetic care. (Target Date: 9th visit)   Baseline Patient is dependent in prosthetic care & has wound on residual limb.   Time 8   Period Weeks   Status On-going   PT LONG TERM GOAL #2   Title Patient tolerates wear of prosthesis >90% of all awake hours with skin issues or pain /discomfort. (9th visit)   Baseline Patient progressed wear too rapidly with skin too moist from sweat and wounds on residual limb.   Time 8   Period Weeks   Status On-going   PT LONG TERM GOAL #3   Title ambulates >1 mile with prosthesis only independently. (9th visit)   Baseline ambulates 100' with prosthesis only with minimal assist.   Time 8   Period Weeks   Status Revised   PT LONG TERM GOAL #4   Title Patient negotiates ramp, curb, stairs & uneven surfaces like grass with prosthesis only independent. (Target Date 9th visit)   Baseline Patient is dependent in negotiating barriers with a prosthesis.   Time 8   Period Weeks   Status On-going   PT LONG TERM GOAL #5   Title Oceanographer >/= 52/56 (Target Date 9th visit)   Baseline Berg Balance 37/56 (<45/56 indicates Fall Risk)   Time 8   Period Weeks   Status Revised   PT LONG TERM GOAL #6   Title Timed Up & Go with prosthesis only <13.5 seconds (Target Date 9th visit)   Baseline Timed Up & Go with prosthesis only 17.47 seconds (>13.5 seconds indicates fall risk)   Time 8   Period Weeks   Status On-going   PT LONG TERM GOAL #7   Title lifts 20# and carries >200' with prosthesis only independently. (Target Date 9th visit)   Baseline Patient is unable to lift or carry items with prosthesis.   Time 8   Period Weeks   Status On-going   PT LONG TERM GOAL #8   Title Functional Status score on FOTO (self-reported mobility) improves by >10 points. (Target Date 9th visit)   Baseline Patient functional status score of 47   Time 8   Period Weeks   Status On-going               Plan - 05/05/14 1400    Clinical  Impression Statement Patient seems ready for discharge next week. Patient seems to understand climb ladder & stepping over obstacles.   Pt will benefit from skilled therapeutic intervention in order to improve on the following deficits Abnormal gait;Decreased activity tolerance;Decreased balance;Decreased endurance;Decreased mobility;Decreased skin integrity;Other (comment)  prosthetic dependency   Rehab Potential Excellent   PT Frequency 1x / week   PT Duration 8 weeks   PT Treatment/Interventions ADLs/Self  Care Home Management;Gait training;Stair training;Functional mobility training;Therapeutic activities;Therapeutic exercise;Balance training;Neuromuscular re-education;Patient/family education;Other (comment)  prosthetic training   PT Next Visit Plan Check LTGs. Discharge   Consulted and Agree with Plan of Care Patient        Problem List Patient Active Problem List   Diagnosis Date Noted  . Diabetic neuropathy associated with type 2 diabetes mellitus 12/01/2013  . Hx of BKA 09/28/2013  . Cellulitis 09/19/2013  . Abdominal pain, unspecified site 09/06/2013  . Noncompliance with medications 07/13/2010  . OTHER SPEC TYPES SCHIZOPHRENIA UNSPEC CONDITION 05/24/2009  . Obesity, unspecified 06/09/2007  . DM (diabetes mellitus), type 2, uncontrolled 05/02/2006  . HYPERTENSION, BENIGN SYSTEMIC 05/02/2006    Jamey Reas PT, DPT 05/05/2014, 2:46 PM  [facility] [address] [place], [place], [address] Phone: [phone]   Fax:  [phone]

## 2014-05-12 ENCOUNTER — Ambulatory Visit: Payer: Medicaid Other | Admitting: Physical Therapy

## 2014-05-12 ENCOUNTER — Encounter: Payer: Self-pay | Admitting: Physical Therapy

## 2014-05-12 DIAGNOSIS — R269 Unspecified abnormalities of gait and mobility: Secondary | ICD-10-CM | POA: Diagnosis not present

## 2014-05-12 DIAGNOSIS — R6889 Other general symptoms and signs: Secondary | ICD-10-CM

## 2014-05-12 DIAGNOSIS — Z89511 Acquired absence of right leg below knee: Secondary | ICD-10-CM

## 2014-05-12 NOTE — Therapy (Signed)
[facility] [address] [place], [place], [address] Phone: [phone]   Fax:  [phone]  Patient Details  Name: [name] MRN: [id number] Date of Birth: 05-18-1955 Referring Provider:  No ref. provider found  Encounter Date: 05/12/2014 PHYSICAL THERAPY DISCHARGE SUMMARY  Visits from Start of Care: 9  Current functional level related to goals / functional outcomes:     PT Long Term Goals - 05/12/14 1341    PT LONG TERM GOAL #1   Title Patient verbalizes proper prosthetic care. (Target Date: 9th visit)   Baseline MET 05/12/14   Time 8   Period Weeks   Status Achieved   PT LONG TERM GOAL #2   Title Patient tolerates wear of prosthesis >90% of all awake hours with skin issues or pain /discomfort. (9th visit)   Baseline MET 05/12/14   Time 8   Period Weeks   Status Achieved   PT LONG TERM GOAL #3   Title ambulates >1 mile with prosthesis only independently. (9th visit)   Baseline MET 05/12/14   Time 8   Period Weeks   Status Achieved   PT LONG TERM GOAL #4   Title Patient negotiates ramp, curb, stairs & uneven surfaces like grass with prosthesis only independent. (Target Date 9th visit)   Baseline MET 05/12/14   Time 8   Period Weeks   Status Achieved   PT LONG TERM GOAL #5   Title Berg Balance >/= 52/56 (Target Date 9th visit)   Baseline MET 05/12/14 Merrilee Jansky 52/56   Time 8   Period Weeks   Status Achieved   PT LONG TERM GOAL #6   Title Timed Up & Go with prosthesis only <13.5 seconds (Target Date 9th visit)   Baseline MET 05/12/14 TUG 11.38sec   Time 8   Period Weeks   Status Achieved   PT LONG TERM GOAL #7   Title lifts 20# and carries >200' with prosthesis only independently. (Target Date 9th visit)   Baseline MET 05/12/14   Time 8   Period Weeks   Status Achieved   PT LONG TERM GOAL #8   Title Functional Status score on FOTO (self-reported mobility) improves by >10 points. (Target Date 9th visit)   Baseline MET FOTO increase 18 points   Time 8   Period Weeks   Status Achieved       Remaining deficits: Patient functioning at full community level with variable cadence with prosthesis only independently.   Education / Equipment: Prosthetic care. Plan: Patient agrees to discharge.  Patient goals were met. Patient is being discharged due to meeting the stated rehab goals.  ?????       WALDRON,ROBIN PT, DPT 05/12/2014, 2:10 PM  [facility] [address] [place], [place], [address] Phone: [phone]   Fax:  [phone]

## 2014-05-12 NOTE — Therapy (Signed)
[facility] [address] [place], [place], [address] Phone: [phone]   Fax:  [phone]  Physical Therapy Treatment  Patient Details  Name: [name] MRN: [id number] Date of Birth: 17-Aug-1955 Referring Provider:  Leone Brand, MD  Encounter Date: 05/12/2014      PT End of Session - 05/12/14 1340    Visit Number 8   Number of Visits 9   Authorization Type Medicaid   Authorization Time Period 9   Authorization - Visit Number 8   Authorization - Number of Visits 8   PT Start Time 7262   PT Stop Time 1354   PT Time Calculation (min) 39 min   Equipment Utilized During Treatment Gait belt   Activity Tolerance Patient tolerated treatment well   Behavior During Therapy WFL for tasks assessed/performed      Past Medical History  Diagnosis Date  . Diabetes mellitus   . Hypertension     Past Surgical History  Procedure Laterality Date  . I&d extremity Right 09/24/2013    Procedure: IRRIGATION AND DEBRIDEMENT RIGHT FOOT ULCER;  Surgeon: Renette Butters, MD;  Location: [facility];  Service: Orthopedics;  Laterality: Right;  . I&d extremity Right 09/26/2013    Procedure: Repeat IRRIGATION AND DEBRIDEMENT Right Foot Ulcer;  Surgeon: Rozanna Box, MD;  Location: [facility] [place];  Service: Orthopedics;  Laterality: Right;  . Amputation Right 09/26/2013    Procedure: AMPUTATION BELOW KNEE;  Surgeon: Rozanna Box, MD;  Location: [place] [facility];  Service: Orthopedics;  Laterality: Right;    There were no vitals taken for this visit.  Visit Diagnosis:  Abnormality of gait  Activity intolerance  Status post below knee amputation of right lower extremity      Subjective Assessment - 05/12/14 1317    Symptoms saw prosthetist yesterday and changes make the prosthesis feel more comfortable. No issues or falls. Wearing prosthesis all awake hours.   Currently in Pain? No/denies          [facility] PT Assessment - 05/12/14 1315     Observation/Other Assessments   Focus on Therapeutic Outcomes (FOTO)  65.5   65.5 functional status at dc, initial was 47.2   Berg Balance Test   Sit to Stand Able to stand without using hands and stabilize independently   Standing Unsupported Able to stand safely 2 minutes   Sitting with Back Unsupported but Feet Supported on Floor or Stool Able to sit safely and securely 2 minutes   Stand to Sit Sits safely with minimal use of hands   Transfers Able to transfer safely, minor use of hands   Standing Unsupported with Eyes Closed Able to stand 10 seconds safely   Standing Ubsupported with Feet Together Able to place feet together independently and stand 1 minute safely   From Standing, Reach Forward with Outstretched Arm Can reach confidently >25 cm (10")   From Standing Position, Pick up Object from Floor Able to pick up shoe safely and easily   From Standing Position, Turn to Look Behind Over each Shoulder Looks behind from both sides and weight shifts well   Turn 360 Degrees Able to turn 360 degrees safely in 4 seconds or less   Standing Unsupported, Alternately Place Feet on Step/Stool Able to stand independently and safely and complete 8 steps in 20 seconds   Standing Unsupported, One Foot in Front Able to take small step independently and hold 30 seconds   Standing on One Leg Able  to lift leg independently and hold equal to or more than 3 seconds   Total Score 52   Functional Gait  Assessment   Gait Level Surface Walks 20 ft in less than 5.5 sec, no assistive devices, good speed, no evidence for imbalance, normal gait pattern, deviates no more than 6 in outside of the 12 in walkway width.   Change in Gait Speed Able to smoothly change walking speed without loss of balance or gait deviation. Deviate no more than 6 in outside of the 12 in walkway width.   Gait with Horizontal Head Turns Performs head turns smoothly with no change in gait. Deviates no more than 6 in outside 12 in walkway  width   Gait with Vertical Head Turns Performs head turns with no change in gait. Deviates no more than 6 in outside 12 in walkway width.   Gait and Pivot Turn Pivot turns safely within 3 sec and stops quickly with no loss of balance.   Step Over Obstacle Is able to step over 2 stacked shoe boxes taped together (9 in total height) without changing gait speed. No evidence of imbalance.   Gait with Narrow Base of Support Is able to ambulate for 10 steps heel to toe with no staggering.   Gait with Eyes Closed Walks 20 ft, no assistive devices, good speed, no evidence of imbalance, normal gait pattern, deviates no more than 6 in outside 12 in walkway width. Ambulates 20 ft in less than 7 sec.   Ambulating Backwards Walks 20 ft, no assistive devices, good speed, no evidence for imbalance, normal gait   Steps Alternating feet, must use rail.   Total Score 29     Prosthetic Training: Patient able to lift and carry 125' a box with 20# independently with prosthesis only. Gait Velocity  4.34 ft/sec comfortable pace & 4.86 ft/sec at fast pace.  Patient ambulates 1 mile at ~ 22.5 min-mile pace. Patient reports he has returned to walking ~1 mile to grocery store.                      PT Education - 05/12/14 1352    Education provided Yes   Education Details follow-up routinely and if gets any issues with prosthesis or skin with prosthetist   Person(s) Educated Patient   Methods Explanation   Comprehension Verbalized understanding          PT Short Term Goals - 04/14/14 1409    PT SHORT TERM GOAL #1   Title verbalizes proper prosthetic care except cues how to adjust ply socks. (Target Date: 5th visit)   Baseline 04/14/14 MET    Time 4   Period Weeks   Status Achieved   PT SHORT TERM GOAL #2   Title tolerates wear of prosthesis >80% of awake hours & wound is healing. (Target Date: 5th visit)   Baseline MET 04/14/14 wound is healing with wear >80% of awake hours.   Time 4    Period Weeks   Status Achieved   PT SHORT TERM GOAL #3   Title ambulates 500' with prosthesis only with cues for deviations. (Target Date: 5th visit)   Baseline MET 04/14/14    Time 4   Period Weeks   Status Achieved   PT SHORT TERM GOAL #4   Title negotiate ramps & curbs with prosthesis only with supervision. (Target Date: 5th visit)   Baseline MET 04/14/14   Time 4   Period Weeks   Status  New           PT Long Term Goals - 05/12/14 1341    PT LONG TERM GOAL #1   Title Patient verbalizes proper prosthetic care. (Target Date: 9th visit)   Baseline MET 05/12/14   Time 8   Period Weeks   Status Achieved   PT LONG TERM GOAL #2   Title Patient tolerates wear of prosthesis >90% of all awake hours with skin issues or pain /discomfort. (9th visit)   Baseline MET 05/12/14   Time 8   Period Weeks   Status Achieved   PT LONG TERM GOAL #3   Title ambulates >1 mile with prosthesis only independently. (9th visit)   Baseline MET 05/12/14   Time 8   Period Weeks   Status Achieved   PT LONG TERM GOAL #4   Title Patient negotiates ramp, curb, stairs & uneven surfaces like grass with prosthesis only independent. (Target Date 9th visit)   Baseline MET 05/12/14   Time 8   Period Weeks   Status Achieved   PT LONG TERM GOAL #5   Title Berg Balance >/= 52/56 (Target Date 9th visit)   Baseline MET 05/12/14 Merrilee Jansky 52/56   Time 8   Period Weeks   Status Achieved   PT LONG TERM GOAL #6   Title Timed Up & Go with prosthesis only <13.5 seconds (Target Date 9th visit)   Baseline MET 05/12/14 TUG 11.38sec   Time 8   Period Weeks   Status Achieved   PT LONG TERM GOAL #7   Title lifts 20# and carries >200' with prosthesis only independently. (Target Date 9th visit)   Baseline MET 05/12/14   Time 8   Period Weeks   Status Achieved   PT LONG TERM GOAL #8   Title Functional Status score on FOTO (self-reported mobility) improves by >10 points. (Target Date 9th visit)   Baseline MET FOTO increase 18 points    Time 8   Period Weeks   Status Achieved               Plan - 05/12/14 1354    Clinical Impression Statement patient met all LTGs established at evaluation. He is functioning with prosthesis only at full community level with variable cadence. He can walk >1 mile with prosthesis only.   Pt will benefit from skilled therapeutic intervention in order to improve on the following deficits Abnormal gait;Decreased activity tolerance;Decreased balance;Decreased endurance;Decreased mobility;Decreased skin integrity;Other (comment)  prosthetic dependency   Rehab Potential Excellent   PT Frequency 1x / week   PT Duration 8 weeks   PT Treatment/Interventions ADLs/Self Care Home Management;Gait training;Stair training;Functional mobility training;Therapeutic activities;Therapeutic exercise;Balance training;Neuromuscular re-education;Patient/family education;Other (comment)  prosthetic training   PT Next Visit Plan  Discharge   Consulted and Agree with Plan of Care Patient        Problem List Patient Active Problem List   Diagnosis Date Noted  . Diabetic neuropathy associated with type 2 diabetes mellitus 12/01/2013  . Hx of BKA 09/28/2013  . Cellulitis 09/19/2013  . Abdominal pain, unspecified site 09/06/2013  . Noncompliance with medications 07/13/2010  . OTHER SPEC TYPES SCHIZOPHRENIA UNSPEC CONDITION 05/24/2009  . Obesity, unspecified 06/09/2007  . DM (diabetes mellitus), type 2, uncontrolled 05/02/2006  . HYPERTENSION, BENIGN SYSTEMIC 05/02/2006    Jamey Reas PT, DPT 05/12/2014, 2:08 PM  [facility] [address] [place], [place], [address] Phone: [phone]   Fax:  [phone]

## 2014-07-17 ENCOUNTER — Encounter (HOSPITAL_COMMUNITY): Payer: Self-pay | Admitting: Nurse Practitioner

## 2014-07-17 ENCOUNTER — Emergency Department (HOSPITAL_COMMUNITY)
Admission: EM | Admit: 2014-07-17 | Discharge: 2014-07-18 | Disposition: A | Discharge status: Home / Self Care | Payer: Medicaid Other | Attending: Emergency Medicine | Admitting: Emergency Medicine

## 2014-07-17 DIAGNOSIS — M96830 Postprocedural hemorrhage and hematoma of a musculoskeletal structure following a musculoskeletal system procedure: Secondary | ICD-10-CM | POA: Insufficient documentation

## 2014-07-17 DIAGNOSIS — I10 Essential (primary) hypertension: Secondary | ICD-10-CM | POA: Diagnosis not present

## 2014-07-17 DIAGNOSIS — E1165 Type 2 diabetes mellitus with hyperglycemia: Secondary | ICD-10-CM | POA: Diagnosis present

## 2014-07-17 DIAGNOSIS — R739 Hyperglycemia, unspecified: Secondary | ICD-10-CM

## 2014-07-17 DIAGNOSIS — T148XXA Other injury of unspecified body region, initial encounter: Secondary | ICD-10-CM

## 2014-07-17 DIAGNOSIS — Z794 Long term (current) use of insulin: Secondary | ICD-10-CM | POA: Diagnosis not present

## 2014-07-17 DIAGNOSIS — Z89511 Acquired absence of right leg below knee: Secondary | ICD-10-CM | POA: Diagnosis not present

## 2014-07-17 DIAGNOSIS — Z79899 Other long term (current) drug therapy: Secondary | ICD-10-CM | POA: Insufficient documentation

## 2014-07-17 LAB — URINALYSIS, ROUTINE W REFLEX MICROSCOPIC
Bilirubin Urine: NEGATIVE
Glucose, UA: >1000 mg/dL — AB
Hgb urine dipstick: NEGATIVE
Ketones, ur: NEGATIVE mg/dL
Leukocytes, UA: NEGATIVE
Nitrite: NEGATIVE
Protein, ur: NEGATIVE mg/dL
Specific Gravity, Urine: 1.026 (ref 1.005–1.030)
Urobilinogen, UA: 0.2 mg/dL (ref 0.0–1.0)
pH: 6 (ref 5.0–8.0)

## 2014-07-17 LAB — COMPREHENSIVE METABOLIC PANEL
ALT: 16 U/L — ABNORMAL LOW (ref 17–63)
AST: 20 U/L (ref 15–41)
Albumin: 3.7 g/dL (ref 3.5–5.0)
Alkaline Phosphatase: 133 U/L — ABNORMAL HIGH (ref 38–126)
Anion gap: 12 (ref 5–15)
BUN: 17 mg/dL (ref 6–20)
CO2: 27 mmol/L (ref 22–32)
Calcium: 9.1 mg/dL (ref 8.9–10.3)
Chloride: 90 mmol/L — ABNORMAL LOW (ref 101–111)
Creatinine, Ser: 1.17 mg/dL (ref 0.61–1.24)
GFR calc Af Amer: >60 mL/min (ref >60)
GFR calc non Af Amer: >60 mL/min (ref >60)
Glucose, Bld: 687 mg/dL (ref 65–99)
Potassium: 5.1 mmol/L (ref 3.5–5.1)
Sodium: 129 mmol/L — ABNORMAL LOW (ref 135–145)
Total Bilirubin: 0.7 mg/dL (ref 0.3–1.2)
Total Protein: 6.7 g/dL (ref 6.5–8.1)

## 2014-07-17 LAB — CBC
HCT: 39.5 % (ref 39.0–52.0)
Hemoglobin: 13.8 g/dL (ref 13.0–17.0)
MCH: 27 pg (ref 26.0–34.0)
MCHC: 34.9 g/dL (ref 30.0–36.0)
MCV: 77.1 fL — ABNORMAL LOW (ref 78.0–100.0)
Platelets: 289 10*3/uL (ref 150–400)
RBC: 5.12 MIL/uL (ref 4.22–5.81)
RDW: 12.3 % (ref 11.5–15.5)
WBC: 8 10*3/uL (ref 4.0–10.5)

## 2014-07-17 LAB — CBG MONITORING, ED: Glucose-Capillary: 598 mg/dL (ref 65–99)

## 2014-07-17 LAB — URINE MICROSCOPIC-ADD ON

## 2014-07-17 MED ORDER — INSULIN ASPART 100 UNIT/ML ~~LOC~~ SOLN
10.0000 [IU] | Freq: Once | SUBCUTANEOUS | Status: AC
Start: 2014-07-17 — End: 2014-07-18
  Administered 2014-07-18: 10 [IU] via SUBCUTANEOUS
  Filled 2014-07-17: qty 1

## 2014-07-17 MED ORDER — SODIUM CHLORIDE 0.9 % IV BOLUS (SEPSIS)
1000.0000 mL | Freq: Once | INTRAVENOUS | Status: AC
Start: 2014-07-17 — End: 2014-07-18
  Administered 2014-07-17: 1000 mL via INTRAVENOUS

## 2014-07-17 NOTE — ED Provider Notes (Signed)
CSN: [id number]     Arrival date & time 07/17/14  2205 History  This chart was scribed for Merryl Hacker, MD by Evelene Croon, ED Scribe. This patient was seen in room D35C/D35C and the patient's care was started 11:25 PM.    Chief Complaint  Patient presents with  . Hyperglycemia  . Bleeding/Bruising    The history is provided by the patient. No language interpreter was used.     HPI Comments:  HASHEM GOYNES is a 59 y.o. 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 Pt states he was picking at a scab and it began bleeding profusely. Pt states he was unable to control the bleeding PTA. He denies regular use of blood thinners but states he has been taking ASA. He denies CP, SOB and recent fevers.  Pt has a h/o IDDM and admits he has not been taking his insulin for about 1 week; states he does not like the way it makes him feel. He has been checking his blood sugar at home and they have been in the 400-500 range. He states his normal dose of insulin is 10 units at night.    Past Medical History  Diagnosis Date  . Diabetes mellitus   . Hypertension    Past Surgical History  Procedure Laterality Date  . I&d extremity Right 09/24/2013    Procedure: IRRIGATION AND DEBRIDEMENT RIGHT FOOT ULCER;  Surgeon: Renette Butters, MD;  Location: [place];  Service: Orthopedics;  Laterality: Right;  . I&d extremity Right 09/26/2013    Procedure: Repeat IRRIGATION AND DEBRIDEMENT Right Foot Ulcer;  Surgeon: Rozanna Box, MD;  Location: [place];  Service: Orthopedics;  Laterality: Right;  . Amputation Right 09/26/2013    Procedure: AMPUTATION BELOW KNEE;  Surgeon: Rozanna Box, MD;  Location: [place];  Service: Orthopedics;  Laterality: Right;   No family history on file. History  Substance Use Topics  . Smoking status: Never Smoker   . Smokeless tobacco: Not on file  . Alcohol Use: No    Review  of Systems  Constitutional: Negative.  Negative for fever.  Respiratory: Negative.  Negative for chest tightness and shortness of breath.   Cardiovascular: Negative.  Negative for chest pain.  Gastrointestinal: Negative.  Negative for abdominal pain.  Genitourinary: Negative.  Negative for dysuria.  Skin: Positive for wound. Negative for color change and rash.  Neurological: Negative for headaches.  All other systems reviewed and are negative.     Allergies  Review of patient's allergies indicates no known allergies.  Home Medications   Prior to Admission medications   Medication Sig Start Date End Date Taking? Authorizing Provider  ACCU-CHEK AVIVA PLUS test strip USE AS INSTRUCTED 11/26/13   Leone Brand, MD  brimonidine [facility]) 0.2 % ophthalmic solution Place 1 drop into the left eye 2 (two) times daily. 09/28/13   Virginia Crews, MD  dorzolamide-timolol (COSOPT) 22.3-6.8 MG/ML ophthalmic solution Place 1 drop into the left eye 2 (two) times daily. 09/28/13   Virginia Crews, MD  gabapentin (NEURONTIN) 300 MG capsule TAKE 1 CAPSULE (300 MG TOTAL) BY MOUTH 3 (THREE) TIMES DAILY. Patient not taking: Reported on 03/11/2014 10/30/13   Leone Brand, MD  guaiFENesin-dextromethorphan [facility] DM) 100-10 MG/5ML syrup Take 15 mLs by mouth every 4 (four) hours as needed for cough. Patient not taking: Reported on 03/11/2014  09/28/13   Virginia Crews, MD  insulin glargine (LANTUS) 100 UNIT/ML injection Inject 0.2 mLs (20 Units total) into the skin at bedtime. 09/28/13   Virginia Crews, MD  lisinopril (PRINIVIL,ZESTRIL) 40 MG tablet Take 0.5 tablets (20 mg total) by mouth daily. Patient not taking: Reported on 03/11/2014 10/02/13   Ivan Anchors Love, PA-C  phenol (CHLORASEPTIC) 1.4 % LIQD Use as directed 1 spray in the mouth or throat as needed for throat irritation / pain. Patient not taking: Reported on 03/11/2014 09/28/13   Virginia Crews, MD  senna-docusate (SENOKOT-S) 8.6-50  MG per tablet Take 2 tablets by mouth 2 (two) times daily. Patient not taking: Reported on 03/11/2014 10/02/13   Ivan Anchors Love, PA-C  Tetrahydrozoline HCl (VISINE OP) Apply 1 drop to eye 2 (two) times daily. Only uses in left eye.    Historical Provider, MD   BP 169/95 mmHg  Pulse 73  Temp(Src) 98.1 F (36.7 C) (Oral)  Resp 16  Ht 6' (1.829 m)  Wt 220 lb (99.791 kg)  BMI 29.83 kg/m2  SpO2 98% Physical Exam  Constitutional: He is oriented to person, place, and time. He appears well-developed and well-nourished. No distress.  HENT:  Head: Normocephalic and atraumatic.  Cardiovascular: Normal rate, regular rhythm and normal heart sounds.   No murmur heard. Pulmonary/Chest: Effort normal and breath sounds normal. No respiratory distress. He has no wheezes.  Abdominal: Soft. Bowel sounds are normal. There is no tenderness. There is no rebound.  Musculoskeletal:  Right BKA, stump with a less than 1 cm superficial ulceration, no active bleeding noted  Neurological: He is alert and oriented to person, place, and time.  Skin: Skin is warm and dry.  Psychiatric: He has a normal mood and affect.  Nursing note and vitals reviewed.   ED Course  Procedures   DIAGNOSTIC STUDIES:  Oxygen Saturation is 95% on RA, adequate by my interpretation.    COORDINATION OF CARE:  11:31 PM Discussed treatment plan with pt at bedside and pt agreed to plan.  Labs Review Labs Reviewed  COMPREHENSIVE METABOLIC PANEL - Abnormal; Notable for the following:    Sodium 129 (*)    Chloride 90 (*)    Glucose, Bld 687 (*)    ALT 16 (*)    Alkaline Phosphatase 133 (*)    All other components within normal limits  CBC - Abnormal; Notable for the following:    MCV 77.1 (*)    All other components within normal limits  URINALYSIS, ROUTINE W REFLEX MICROSCOPIC - Abnormal; Notable for the following:    Color, Urine STRAW (*)    Glucose, UA >1000 (*)    All other components within normal limits  CBG MONITORING,  ED - Abnormal; Notable for the following:    Glucose-Capillary 598 (*)    All other components within normal limits  CBG MONITORING, ED - Abnormal; Notable for the following:    Glucose-Capillary 570 (*)    All other components within normal limits  CBG MONITORING, ED - Abnormal; Notable for the following:    Glucose-Capillary 497 (*)    All other components within normal limits  CBG MONITORING, ED - Abnormal; Notable for the following:    Glucose-Capillary 259 (*)    All other components within normal limits  URINE MICROSCOPIC-ADD ON    Imaging Review No results found.   EKG Interpretation None      MDM   Final diagnoses:  Hyperglycemia  Bleeding from wound  Patient presents with bleeding from his BKA stump. On my evaluation, small ulceration noted with no active bleeding. Patient was incidentally noted to be hyperglycemic by EMS. Lab work obtained. Blood sugar is 687. He has not been compliant with his insulin for over a week. Patient was given fluids and 10 units of subcutaneous insulin. We'll repeat CBG.  Patient given a total of 2 L of fluid and 10 units of insulin. Repeat CBG 259. Patient instructed to resume insulin. He is to follow-up with his primary care physician. He was given wound care instructions.  After history, exam, and medical workup I feel the patient has been appropriately medically screened and is safe for discharge home. Pertinent diagnoses were discussed with the patient. Patient was given return precautions.   I personally performed the services described in this documentation, which was scribed in my presence. The recorded information has been reviewed and is accurate.    Merryl Hacker, MD 07/18/14 [phone]

## 2014-07-17 NOTE — ED Notes (Signed)
Per PTAR pt from home to be evaluated for bleeding at amputation site due to patient picking at scab. PTAR sts pt had profuse bleeding which is now controlled by gauze and abd pad.  PTAR incidentally found patient to be hyperglycemic.

## 2014-07-18 LAB — CBG MONITORING, ED
Glucose-Capillary: 259 mg/dL — ABNORMAL HIGH (ref 65–99)
Glucose-Capillary: 497 mg/dL — ABNORMAL HIGH (ref 65–99)
Glucose-Capillary: 570 mg/dL (ref 65–99)

## 2014-07-18 MED ORDER — SODIUM CHLORIDE 0.9 % IV BOLUS (SEPSIS)
1000.0000 mL | Freq: Once | INTRAVENOUS | Status: AC
  Administered 2014-07-18: 1000 mL via INTRAVENOUS

## 2014-07-18 NOTE — ED Notes (Signed)
Pt voiding in  Large amounts

## 2014-07-18 NOTE — ED Notes (Signed)
CBG CHECKED Blyn

## 2014-07-18 NOTE — ED Notes (Signed)
Pt comfortable  

## 2014-07-18 NOTE — ED Notes (Signed)
Pt refuses wheelchair.

## 2014-07-18 NOTE — ED Notes (Signed)
CHECKED CBG 497 RN CHRIS INFORMED

## 2014-07-18 NOTE — ED Notes (Addendum)
CBG CHECKED [name]

## 2014-07-18 NOTE — Discharge Instructions (Signed)
You were seen today for a wound over your stump site. The wound needs to be dressed with bacitracin daily and covered with gauze to prevent further bleeding. You were also noted to be hyperglycemic. You need restart taking your insulin and follow-up with her primary physician.  Hyperglycemia Hyperglycemia occurs when the glucose (sugar) in your blood is too high. Hyperglycemia can happen for many reasons, but it most often happens to people who do not know they have diabetes or are not managing their diabetes properly.  CAUSES  Whether you have diabetes or not, there are other causes of hyperglycemia. Hyperglycemia can occur when you have diabetes, but it can also occur in other situations that you might not be as aware of, such as: Diabetes  If you have diabetes and are having problems controlling your blood glucose, hyperglycemia could occur because of some of the following reasons:  Not following your meal plan.  Not taking your diabetes medications or not taking it properly.  Exercising less or doing less activity than you normally do.  Being sick. Pre-diabetes  This cannot be ignored. Before people develop Type 2 diabetes, they almost always have "pre-diabetes." This is when your blood glucose levels are higher than normal, but not yet high enough to be diagnosed as diabetes. Research has shown that some long-term damage to the body, especially the heart and circulatory system, may already be occurring during pre-diabetes. If you take action to manage your blood glucose when you have pre-diabetes, you may delay or prevent Type 2 diabetes from developing. Stress  If you have diabetes, you may be "diet" controlled or on oral medications or insulin to control your diabetes. However, you may find that your blood glucose is higher than usual in the hospital whether you have diabetes or not. This is often referred to as "stress hyperglycemia." Stress can elevate your blood glucose. This happens  because of hormones put out by the body during times of stress. If stress has been the cause of your high blood glucose, it can be followed regularly by your caregiver. That way he/she can make sure your hyperglycemia does not continue to get worse or progress to diabetes. Steroids  Steroids are medications that act on the infection fighting system (immune system) to block inflammation or infection. One side effect can be a rise in blood glucose. Most people can produce enough extra insulin to allow for this rise, but for those who cannot, steroids make blood glucose levels go even higher. It is not unusual for steroid treatments to "uncover" diabetes that is developing. It is not always possible to determine if the hyperglycemia will go away after the steroids are stopped. A special blood test called an A1c is sometimes done to determine if your blood glucose was elevated before the steroids were started. SYMPTOMS  Thirsty.  Frequent urination.  Dry mouth.  Blurred vision.  Tired or fatigue.  Weakness.  Sleepy.  Tingling in feet or leg. DIAGNOSIS  Diagnosis is made by monitoring blood glucose in one or all of the following ways:  A1c test. This is a chemical found in your blood.  Fingerstick blood glucose monitoring.  Laboratory results. TREATMENT  First, knowing the cause of the hyperglycemia is important before the hyperglycemia can be treated. Treatment may include, but is not be limited to:  Education.  Change or adjustment in medications.  Change or adjustment in meal plan.  Treatment for an illness, infection, etc.  More frequent blood glucose monitoring.  Change  in exercise plan.  Decreasing or stopping steroids.  Lifestyle changes. HOME CARE INSTRUCTIONS   Test your blood glucose as directed.  Exercise regularly. Your caregiver will give you instructions about exercise. Pre-diabetes or diabetes which comes on with stress is helped by exercising.  Eat  wholesome, balanced meals. Eat often and at regular, fixed times. Your caregiver or nutritionist will give you a meal plan to guide your sugar intake.  Being at an ideal weight is important. If needed, losing as little as 10 to 15 pounds may help improve blood glucose levels. SEEK MEDICAL CARE IF:   You have questions about medicine, activity, or diet.  You continue to have symptoms (problems such as increased thirst, urination, or weight gain). SEEK IMMEDIATE MEDICAL CARE IF:   You are vomiting or have diarrhea.  Your breath smells fruity.  You are breathing faster or slower.  You are very sleepy or incoherent.  You have numbness, tingling, or pain in your feet or hands.  You have chest pain.  Your symptoms get worse even though you have been following your caregiver's orders.  If you have any other questions or concerns. Document Released: 08/15/2000 Document Revised: 05/14/2011 Document Reviewed: 06/18/2011 [facility] Patient Information 2015 [place], [place]. This information is not intended to replace advice given to you by your health care provider. Make sure you discuss any questions you have with your health care provider.

## 2014-08-06 ENCOUNTER — Ambulatory Visit (INDEPENDENT_AMBULATORY_CARE_PROVIDER_SITE_OTHER): Payer: Medicaid Other | Admitting: Family Medicine

## 2014-08-06 ENCOUNTER — Encounter: Payer: Self-pay | Admitting: Family Medicine

## 2014-08-06 VITALS — BP 215/115 | Ht 72.0 in | Wt 210.5 lb

## 2014-08-06 DIAGNOSIS — T879 Unspecified complications of amputation stump: Secondary | ICD-10-CM | POA: Diagnosis not present

## 2014-08-06 DIAGNOSIS — R739 Hyperglycemia, unspecified: Secondary | ICD-10-CM | POA: Diagnosis not present

## 2014-08-06 DIAGNOSIS — I160 Hypertensive urgency: Secondary | ICD-10-CM

## 2014-08-06 DIAGNOSIS — E1165 Type 2 diabetes mellitus with hyperglycemia: Secondary | ICD-10-CM | POA: Diagnosis not present

## 2014-08-06 DIAGNOSIS — IMO0002 Reserved for concepts with insufficient information to code with codable children: Secondary | ICD-10-CM

## 2014-08-06 DIAGNOSIS — I10 Essential (primary) hypertension: Secondary | ICD-10-CM | POA: Diagnosis not present

## 2014-08-06 LAB — GLUCOSE, CAPILLARY: Glucose-Capillary: 469 mg/dL — ABNORMAL HIGH (ref 65–99)

## 2014-08-06 LAB — POCT GLYCOSYLATED HEMOGLOBIN (HGB A1C): Hemoglobin A1C: >15

## 2014-08-06 MED ORDER — LISINOPRIL 40 MG PO TABS: 20.0000 mg | ORAL_TABLET | Freq: Every day | ORAL | Status: DC

## 2014-08-06 MED ORDER — INSULIN ASPART 100 UNIT/ML ~~LOC~~ SOLN: 5.0000 [IU] | Freq: Three times a day (TID) | SUBCUTANEOUS | Status: DC

## 2014-08-06 MED ORDER — CLONIDINE HCL 0.1 MG PO TABS
0.1000 mg | ORAL_TABLET | Freq: Once | ORAL | Status: AC
  Administered 2014-08-06: 0.1 mg via ORAL

## 2014-08-06 NOTE — Assessment & Plan Note (Addendum)
After very long discussion in conjunction with Ms. Timmons of [facility], he has agreed to restart BP medications. While I hope this is a permanent change of heart for him, I fear it is just a concession to people who care more about his life than he can right now. Represcribed lisinopril, will need follow up for K, Cr in 2 - 4 weeks. He will need more agents but, like his blood sugar, I don't want to lower so quickly that orthostatic symptoms are felt which would certainly lead to nonadherence.

## 2014-08-06 NOTE — Assessment & Plan Note (Signed)
Asymptomatic, almost certainly chronic, elevation into severe range BP. Urged to be directly admitted to [facility] today, but declined. Given clonidine 0.1mg  tab po x1 and left prior to recheck.

## 2014-08-06 NOTE — Patient Instructions (Signed)
Please take your lantus as directed starting this evening. Also pick up lisinopril, your blood pressure medicine, and take that starting tomorrow morning. You need to go see Dr. Sharol Given as soon as possible. please call his office to schedule this. He can give you the prescription for any supplies you might need.   Please make a follow up appointment in the next 1 - 2 weeks to check your blood pressure and blood sugar.

## 2014-08-06 NOTE — Assessment & Plan Note (Signed)
Hb A1c > 15% today and CBG POC is > 450mg /dl, all related to noncompliance (both with medications and diet) which is related to underlying depression/prolonged adjustment reaction to R leg BKA. Given novolog 10u to prevent ketosis and he has confirmed that he will begin lantus as originally prescribed this evening and will record blood sugars to present at follow up. Will need significant up-titration of current dose, but any neuroglycopenic or autonomic responses would (as they have in the past) lead to discontinuation.

## 2014-08-06 NOTE — Progress Notes (Signed)
S: [name] is a 59 y.o. male with a history of IDDM causing neuropathy and R BKA 09/2013 as well as HTN and noncompliance with medical therapies.   He is accompanied by Ms. 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 He is taking no insulin and has not had any other medications even in his house since that time. The 1 or 2 times a week he checks his blood sugars they are > 350mg /dl. Denies polyuria, polydipsia, weight loss, hypoglycemic symptoms. + Blurry vision. He doesn't check his BP but denies HA, SOB, CP, palpitations.   He has perseverative negativity about the inpatient setting and states that he will not go to the hospital because that's where they cut his leg off, despite, he says, him taking all his medicines. He denies thoughts of harming himself and refutes my proposition that he seems not to care about living. He takes no medications for depression and declines having any therapist speak with him. He dislikes crowds of people and will not go to a support group. His favorite hobbies were running, playing basketball: activities he is unable to continue.   ROS: As above PMH: Schizophrenia vs. other mood disorder. No treatments.   O: BP 215/115 mmHg  Ht 6' (1.829 m)  Wt 210 lb 8 oz (95.482 kg)  BMI 28.54 kg/m2 Gen: Apathetic gentleman sitting in no distress CV: Reg rate, no murmur. cap refill < 2 sec. No edema or JVD Pulm: CTAB, normal effort Ext: RLE BKA stump with open wound within surgical scar without drainage/purulence or significant surrounding erythema Psych: Neatly groomed and appropriately dressed. Maintains good eye contact and is cooperative and attentive. Speech is low volume and slow rate. Mood is markedly depressed with a significantly restricted affect. Thought process is goal directed, though judgment is poor. No suicidal or homicidal  ideation. Does not appear to be responding to any internal stimuli. Cognitive ability may be at or above average.    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 See problem list.

## 2014-08-06 NOTE — Assessment & Plan Note (Addendum)
Referral back to Dr. Sharol Given and wound care center for further management. No signs of infection at this time. Sleeve seems to be fine but scar line dehiscence related to a fall without prosthesis has not healed in many months (related to hyperglycemia). Final common pathway of further amputations was discussed as a certain result of continued non-treatment of his significant comorbidities.

## 2014-08-07 NOTE — Progress Notes (Signed)
One of the assigned preceptor. 

## 2014-08-13 ENCOUNTER — Telehealth: Payer: Self-pay | Admitting: Family Medicine

## 2014-08-13 NOTE — Telephone Encounter (Signed)
Dorothy from [facility] is calling to inform that at the last appt with Dr. Bonner Puna, the patient agreed to start taking his insulin and BP medication. Currently his sugars are running 300 plus and he is only taking his novolog and not taking his lantus. She encouraged him to take his medication and patient agreed. She will be will following up with the patient and let the provider know. Also, the patient is declining mental health services and considering his condition she will continue to encourage him to do so. Dorothy's mobile number is provided as well as her office number. Thank you, Fonda Kinder, ASA

## 2014-08-13 NOTE — Telephone Encounter (Signed)
Will forward to pcp and Dr. Bonner Puna to make him aware and see what he would like patient to do. Jazmin Hartsell,CMA

## 2014-08-18 ENCOUNTER — Ambulatory Visit (HOSPITAL_COMMUNITY)
Admission: RE | Admit: 2014-08-18 | Discharge: 2014-08-18 | Disposition: A | Discharge status: Home / Self Care | Payer: Medicaid Other | Source: Ambulatory Visit | Attending: Surgery | Admitting: Surgery

## 2014-08-18 ENCOUNTER — Other Ambulatory Visit: Payer: Self-pay | Admitting: Surgery

## 2014-08-18 ENCOUNTER — Encounter (HOSPITAL_BASED_OUTPATIENT_CLINIC_OR_DEPARTMENT_OTHER): Payer: Medicaid Other | Attending: Surgery

## 2014-08-18 DIAGNOSIS — L97812 Non-pressure chronic ulcer of other part of right lower leg with fat layer exposed: Secondary | ICD-10-CM | POA: Insufficient documentation

## 2014-08-18 DIAGNOSIS — Z87891 Personal history of nicotine dependence: Secondary | ICD-10-CM | POA: Insufficient documentation

## 2014-08-18 DIAGNOSIS — F329 Major depressive disorder, single episode, unspecified: Secondary | ICD-10-CM | POA: Diagnosis not present

## 2014-08-18 DIAGNOSIS — E1165 Type 2 diabetes mellitus with hyperglycemia: Secondary | ICD-10-CM | POA: Diagnosis not present

## 2014-08-18 DIAGNOSIS — Z89511 Acquired absence of right leg below knee: Secondary | ICD-10-CM | POA: Insufficient documentation

## 2014-08-18 DIAGNOSIS — Z794 Long term (current) use of insulin: Secondary | ICD-10-CM | POA: Insufficient documentation

## 2014-08-18 DIAGNOSIS — M869 Osteomyelitis, unspecified: Secondary | ICD-10-CM | POA: Diagnosis not present

## 2014-08-18 DIAGNOSIS — T879 Unspecified complications of amputation stump: Secondary | ICD-10-CM | POA: Diagnosis not present

## 2014-08-18 DIAGNOSIS — I10 Essential (primary) hypertension: Secondary | ICD-10-CM | POA: Diagnosis not present

## 2014-08-18 DIAGNOSIS — E1140 Type 2 diabetes mellitus with diabetic neuropathy, unspecified: Secondary | ICD-10-CM | POA: Insufficient documentation

## 2014-08-18 DIAGNOSIS — E11622 Type 2 diabetes mellitus with other skin ulcer: Secondary | ICD-10-CM | POA: Diagnosis not present

## 2014-08-18 DIAGNOSIS — X58XXXA Exposure to other specified factors, initial encounter: Secondary | ICD-10-CM | POA: Insufficient documentation

## 2014-08-18 NOTE — Telephone Encounter (Signed)
Unfortunately for [name], he is completely competent to make his own medical decisions, however detrimental to his health they are. He has been made aware of the likely outcomes of uncontrolled diabetes and hypertension. He is not an immediate threat to himself or others and so no involuntary mental health treatments can be pursued.   I will defer further discussions to his PCP.

## 2014-08-25 ENCOUNTER — Other Ambulatory Visit: Payer: Self-pay | Admitting: Surgery

## 2014-08-25 DIAGNOSIS — R52 Pain, unspecified: Secondary | ICD-10-CM

## 2014-08-25 DIAGNOSIS — L97812 Non-pressure chronic ulcer of other part of right lower leg with fat layer exposed: Secondary | ICD-10-CM | POA: Diagnosis not present

## 2014-09-01 ENCOUNTER — Ambulatory Visit (HOSPITAL_COMMUNITY): Admission: RE | Admit: 2014-09-01 | Payer: Medicaid Other | Source: Ambulatory Visit

## 2014-09-01 DIAGNOSIS — L97812 Non-pressure chronic ulcer of other part of right lower leg with fat layer exposed: Secondary | ICD-10-CM | POA: Diagnosis not present

## 2014-09-06 ENCOUNTER — Other Ambulatory Visit: Payer: Self-pay | Admitting: Family Medicine

## 2014-09-08 ENCOUNTER — Encounter (HOSPITAL_BASED_OUTPATIENT_CLINIC_OR_DEPARTMENT_OTHER): Payer: Medicaid Other | Attending: Surgery

## 2014-09-08 DIAGNOSIS — Z9119 Patient's noncompliance with other medical treatment and regimen: Secondary | ICD-10-CM | POA: Insufficient documentation

## 2014-09-08 DIAGNOSIS — T8781 Dehiscence of amputation stump: Secondary | ICD-10-CM | POA: Diagnosis not present

## 2014-09-08 DIAGNOSIS — E1040 Type 1 diabetes mellitus with diabetic neuropathy, unspecified: Secondary | ICD-10-CM | POA: Diagnosis present

## 2014-09-08 DIAGNOSIS — I10 Essential (primary) hypertension: Secondary | ICD-10-CM | POA: Diagnosis not present

## 2014-09-09 ENCOUNTER — Ambulatory Visit (HOSPITAL_COMMUNITY)
Admission: RE | Admit: 2014-09-09 | Discharge: 2014-09-09 | Disposition: A | Discharge status: Home / Self Care | Payer: Medicaid Other | Source: Ambulatory Visit | Attending: Surgery | Admitting: Surgery

## 2014-09-09 DIAGNOSIS — R52 Pain, unspecified: Secondary | ICD-10-CM

## 2014-09-09 DIAGNOSIS — L97819 Non-pressure chronic ulcer of other part of right lower leg with unspecified severity: Secondary | ICD-10-CM | POA: Insufficient documentation

## 2014-09-09 DIAGNOSIS — Z89511 Acquired absence of right leg below knee: Secondary | ICD-10-CM | POA: Insufficient documentation

## 2014-09-15 DIAGNOSIS — T8781 Dehiscence of amputation stump: Secondary | ICD-10-CM | POA: Diagnosis not present

## 2014-09-22 DIAGNOSIS — T8781 Dehiscence of amputation stump: Secondary | ICD-10-CM | POA: Diagnosis not present

## 2014-09-29 DIAGNOSIS — T8781 Dehiscence of amputation stump: Secondary | ICD-10-CM | POA: Diagnosis not present

## 2014-10-06 ENCOUNTER — Encounter (HOSPITAL_BASED_OUTPATIENT_CLINIC_OR_DEPARTMENT_OTHER): Payer: Medicaid Other | Attending: Surgery

## 2014-10-06 DIAGNOSIS — E1140 Type 2 diabetes mellitus with diabetic neuropathy, unspecified: Secondary | ICD-10-CM | POA: Diagnosis not present

## 2014-10-06 DIAGNOSIS — Z89511 Acquired absence of right leg below knee: Secondary | ICD-10-CM | POA: Diagnosis not present

## 2014-10-06 DIAGNOSIS — E1165 Type 2 diabetes mellitus with hyperglycemia: Secondary | ICD-10-CM | POA: Diagnosis not present

## 2014-10-06 DIAGNOSIS — Z87891 Personal history of nicotine dependence: Secondary | ICD-10-CM | POA: Diagnosis not present

## 2014-10-06 DIAGNOSIS — E11622 Type 2 diabetes mellitus with other skin ulcer: Secondary | ICD-10-CM | POA: Insufficient documentation

## 2014-10-06 DIAGNOSIS — I10 Essential (primary) hypertension: Secondary | ICD-10-CM | POA: Diagnosis not present

## 2014-10-06 DIAGNOSIS — L97811 Non-pressure chronic ulcer of other part of right lower leg limited to breakdown of skin: Secondary | ICD-10-CM | POA: Diagnosis not present

## 2014-10-13 DIAGNOSIS — E11622 Type 2 diabetes mellitus with other skin ulcer: Secondary | ICD-10-CM | POA: Diagnosis not present

## 2014-10-20 ENCOUNTER — Other Ambulatory Visit: Payer: Self-pay | Admitting: Family Medicine

## 2014-10-20 DIAGNOSIS — E11622 Type 2 diabetes mellitus with other skin ulcer: Secondary | ICD-10-CM | POA: Diagnosis not present

## 2014-10-20 NOTE — Telephone Encounter (Signed)
Refill request from pharmacy. Will forward to PCP for review. Adams,Latoya, CMA. 

## 2014-10-27 DIAGNOSIS — E11622 Type 2 diabetes mellitus with other skin ulcer: Secondary | ICD-10-CM | POA: Diagnosis not present

## 2014-11-03 DIAGNOSIS — E11622 Type 2 diabetes mellitus with other skin ulcer: Secondary | ICD-10-CM | POA: Diagnosis not present

## 2014-11-09 ENCOUNTER — Encounter: Payer: Medicaid Other | Attending: Surgery | Admitting: *Deleted

## 2014-11-09 ENCOUNTER — Other Ambulatory Visit: Payer: Self-pay | Admitting: Family Medicine

## 2014-11-09 ENCOUNTER — Encounter: Payer: Self-pay | Admitting: *Deleted

## 2014-11-09 VITALS — Ht 72.0 in | Wt 223.6 lb

## 2014-11-09 DIAGNOSIS — E1165 Type 2 diabetes mellitus with hyperglycemia: Secondary | ICD-10-CM | POA: Diagnosis not present

## 2014-11-09 DIAGNOSIS — IMO0002 Reserved for concepts with insufficient information to code with codable children: Secondary | ICD-10-CM

## 2014-11-09 DIAGNOSIS — Z794 Long term (current) use of insulin: Secondary | ICD-10-CM | POA: Diagnosis not present

## 2014-11-09 DIAGNOSIS — Z713 Dietary counseling and surveillance: Secondary | ICD-10-CM | POA: Insufficient documentation

## 2014-11-09 NOTE — Telephone Encounter (Signed)
[name] with the [facility] Diabetes department called to ask that [name] have new refill on his Lantus.  Never picked up the rx given last July.  [facility] to [facility] [address].

## 2014-11-10 ENCOUNTER — Encounter (HOSPITAL_BASED_OUTPATIENT_CLINIC_OR_DEPARTMENT_OTHER): Payer: Medicaid Other | Attending: Surgery

## 2014-11-10 ENCOUNTER — Other Ambulatory Visit: Payer: Self-pay | Admitting: Family Medicine

## 2014-11-10 DIAGNOSIS — L97811 Non-pressure chronic ulcer of other part of right lower leg limited to breakdown of skin: Secondary | ICD-10-CM | POA: Insufficient documentation

## 2014-11-10 DIAGNOSIS — E1140 Type 2 diabetes mellitus with diabetic neuropathy, unspecified: Secondary | ICD-10-CM | POA: Insufficient documentation

## 2014-11-10 DIAGNOSIS — E11622 Type 2 diabetes mellitus with other skin ulcer: Secondary | ICD-10-CM | POA: Insufficient documentation

## 2014-11-10 DIAGNOSIS — Z89511 Acquired absence of right leg below knee: Secondary | ICD-10-CM | POA: Diagnosis not present

## 2014-11-10 DIAGNOSIS — I10 Essential (primary) hypertension: Secondary | ICD-10-CM | POA: Insufficient documentation

## 2014-11-10 DIAGNOSIS — Z87891 Personal history of nicotine dependence: Secondary | ICD-10-CM | POA: Diagnosis not present

## 2014-11-10 DIAGNOSIS — Z794 Long term (current) use of insulin: Secondary | ICD-10-CM | POA: Insufficient documentation

## 2014-11-10 NOTE — Telephone Encounter (Signed)
Refilled

## 2014-11-11 NOTE — Patient Instructions (Signed)
Have your sister [facility] to [facility] [address] on [facility]10/14 (tomorrow) to pick up your Lantus insulin. Resume taking your lantus insulin tomorrow. Wednesday evening take 10units of Lantus. Take this dose for three days. Saturday evening take 13 units of Lantus. Take this dose for three days. Tuesday evening take 16 units of Lantus. Take this dose for three day. Friday evening take 19 unit of Lantus. Take this dose for three days. Monday evening take 20 units of Lantus. Take this dose for three days.  Continue to take your Novolog insulin with your meals.  It is recommended that you take both the Lantus and the Novolog insulin. However, If you are only going to take one insulin take the Lantus every day.  Continue to make good food choices. Try to decrease the intake of sweets.

## 2014-11-11 NOTE — Progress Notes (Signed)
Diabetes Self-Management Education  Visit Type: First/Initial  Appt. Start Time: 1430 Appt. End Time: 1600  11/11/2014  [name], identified by name and date of birth, is a 59 y.o. male with a diagnosis of Diabetes: Type 2. [name] presents alone. His sister dropped him off and returned to pick him up. [name] has a significant history of mental health concerns. This present a challenge in communication. He is a poor historian with questionable accuracy. He initially noted that he had not taken his Lantus for awhile because he did not have any refills on his prescription. He then noted that he stopped taking it because he didn't want to.  [facility] called [facility] [address] and found that he had a prescription of Lantis in 2015 that he did not pick up. It is now expired. He has picked up his Novolog. I contacted [facility] and requested a new prescription for Lantus. In review of documentation from [facility] it is noted that [name] lives alone and is considered capable of making his own decisions. He is presently refusing mental health assistance. At the end of the appointment I walked outside with [name] and provided his sister a copy of the insulin schedule I make for him to follow and asked her to pick up his Lantus. She only shook her head in affirmation. Although I strongly encouraged [name] to restart his Lantus as previously prescribed, I am doubtful that he will engage.  ASSESSMENT  Height 6' (1.829 m), weight 223 lb 9.6 oz (101.424 kg). Body mass index is 30.32 kg/(m^2).      Diabetes Self-Management Education - 11/09/14 1522    Visit Information   Visit Type First/Initial   Initial Visit   Diabetes Type Type 2   Are you currently following a meal plan? No   Are you taking your medications as prescribed? No  Has not taken Lantus for over one year   Psychosocial Assessment   Patient Belief/Attitude about Diabetes Other (comment)  psychiatric diagnosis produces  challenge in personal healthcare   Self-care barriers Lack of transportation;Other (comment)  psychiatric diagnoses   Self-management support Doctor's office;Family;CDE visits   Other persons present Patient   Patient Concerns Other (comment)  Patient had no concerns or interest in being here. He was sent by PCP and agreed to come.   Special Needs Other (comment)  Patient with long statnding mental health concerns and severe depression. Is non compliant with dietary intake and medication    Preferred Learning Style No preference indicated   Learning Readiness Other (comment)  I question patients ability to comprehend and comply with needed changes in behavior   Complications   Last HgB A1C per patient/outside source 15 %   How often do you check your blood sugar? 0 times/day (not testing)  patient states he tests 2 times daily   Fasting Blood glucose range (mg/dL) >200   Postprandial Blood glucose range (mg/dL) >200   Number of hyperglycemic episodes per week 21  patient glucose readings all greater than 200   Dietary Intake   Breakfast cereal or grits, eggs, sausage, Coke Zero or KoolAid with no sugar   Lunch 2 cheese burgers, oatmeal cookies   Snack (afternoon) potato chips   Dinner chicken, rice or pinto beans, green beans   Exercise   Exercise Type Light (walking / raking leaves)  walks to the [place], lives across the street   Patient Education   Nutrition management  Role of diet in  the treatment of diabetes and the relationship between the three main macronutrients and blood glucose level;Information on hints to eating out and maintain blood glucose control.;Meal options for control of blood glucose level and chronic complications.   Physical activity and exercise  Role of exercise on diabetes management, blood pressure control and cardiac health.   Medications Reviewed patients medication for diabetes, action, purpose, timing of dose and side effects.   Monitoring Purpose and  frequency of SMBG.   Chronic complications Relationship between chronic complications and blood glucose control   Individualized Goals (developed by patient)   Nutrition General guidelines for healthy choices and portions discussed   Medications take my medication as prescribed   Monitoring  test my blood glucose as discussed   Outcomes   Expected Outcomes Demonstrated limited interest in learning.  Expect minimal changes   Future DMSE PRN   Program Status Completed      Individualized Plan for Diabetes Self-Management Training:   Learning Objective:  Patient will have a greater understanding of diabetes self-management. Patient education plan is to attend individual and/or group sessions per assessed needs and concerns.   Plan:   There are no Patient Instructions on file for this visit.  Expected Outcomes:  Demonstrated limited interest in learning.  Expect minimal changes  Education material provided: Meal plan card and My Plate  If problems or questions, patient to contact team via:  Phone  Future DSME appointment: PRN

## 2014-11-17 DIAGNOSIS — L97811 Non-pressure chronic ulcer of other part of right lower leg limited to breakdown of skin: Secondary | ICD-10-CM | POA: Diagnosis not present

## 2014-11-24 DIAGNOSIS — L97811 Non-pressure chronic ulcer of other part of right lower leg limited to breakdown of skin: Secondary | ICD-10-CM | POA: Diagnosis not present

## 2014-12-01 DIAGNOSIS — L97811 Non-pressure chronic ulcer of other part of right lower leg limited to breakdown of skin: Secondary | ICD-10-CM | POA: Diagnosis not present

## 2014-12-08 ENCOUNTER — Encounter (HOSPITAL_BASED_OUTPATIENT_CLINIC_OR_DEPARTMENT_OTHER): Payer: Medicaid Other | Attending: Surgery

## 2014-12-08 DIAGNOSIS — L97212 Non-pressure chronic ulcer of right calf with fat layer exposed: Secondary | ICD-10-CM | POA: Diagnosis present

## 2014-12-08 DIAGNOSIS — T879 Unspecified complications of amputation stump: Secondary | ICD-10-CM | POA: Insufficient documentation

## 2014-12-08 DIAGNOSIS — E11622 Type 2 diabetes mellitus with other skin ulcer: Secondary | ICD-10-CM | POA: Insufficient documentation

## 2014-12-08 DIAGNOSIS — Z89511 Acquired absence of right leg below knee: Secondary | ICD-10-CM | POA: Insufficient documentation

## 2014-12-08 DIAGNOSIS — E1165 Type 2 diabetes mellitus with hyperglycemia: Secondary | ICD-10-CM | POA: Diagnosis not present

## 2014-12-08 DIAGNOSIS — X58XXXD Exposure to other specified factors, subsequent encounter: Secondary | ICD-10-CM | POA: Diagnosis not present

## 2014-12-08 DIAGNOSIS — I10 Essential (primary) hypertension: Secondary | ICD-10-CM | POA: Insufficient documentation

## 2014-12-15 DIAGNOSIS — L97212 Non-pressure chronic ulcer of right calf with fat layer exposed: Secondary | ICD-10-CM | POA: Diagnosis not present

## 2014-12-22 DIAGNOSIS — L97212 Non-pressure chronic ulcer of right calf with fat layer exposed: Secondary | ICD-10-CM | POA: Diagnosis not present

## 2014-12-29 DIAGNOSIS — L97212 Non-pressure chronic ulcer of right calf with fat layer exposed: Secondary | ICD-10-CM | POA: Diagnosis not present

## 2015-01-03 ENCOUNTER — Ambulatory Visit (INDEPENDENT_AMBULATORY_CARE_PROVIDER_SITE_OTHER): Payer: Medicaid Other | Admitting: Family Medicine

## 2015-01-03 VITALS — BP 185/108 | HR 127 | Temp 98.2°F | Ht 72.0 in | Wt 230.0 lb

## 2015-01-03 DIAGNOSIS — I10 Essential (primary) hypertension: Secondary | ICD-10-CM

## 2015-01-03 DIAGNOSIS — H409 Unspecified glaucoma: Secondary | ICD-10-CM

## 2015-01-03 DIAGNOSIS — E1165 Type 2 diabetes mellitus with hyperglycemia: Secondary | ICD-10-CM

## 2015-01-03 DIAGNOSIS — Z23 Encounter for immunization: Secondary | ICD-10-CM | POA: Diagnosis not present

## 2015-01-03 DIAGNOSIS — T879 Unspecified complications of amputation stump: Secondary | ICD-10-CM

## 2015-01-03 DIAGNOSIS — H5440 Blindness, one eye, unspecified eye: Secondary | ICD-10-CM

## 2015-01-03 DIAGNOSIS — H5442 Blindness, left eye, normal vision right eye: Secondary | ICD-10-CM | POA: Diagnosis not present

## 2015-01-03 DIAGNOSIS — IMO0001 Reserved for inherently not codable concepts without codable children: Secondary | ICD-10-CM

## 2015-01-03 LAB — BASIC METABOLIC PANEL WITH GFR
BUN: 11 mg/dL (ref 7–25)
CO2: 32 mmol/L — ABNORMAL HIGH (ref 20–31)
Calcium: 9.1 mg/dL (ref 8.6–10.3)
Chloride: 97 mmol/L — ABNORMAL LOW (ref 98–110)
Creat: 0.9 mg/dL (ref 0.70–1.33)
GFR, Est African American: >89 mL/min (ref >=60)
GFR, Est Non African American: >89 mL/min (ref >=60)
Glucose, Bld: 293 mg/dL — ABNORMAL HIGH (ref 65–99)
Potassium: 3.7 mmol/L (ref 3.5–5.3)
Sodium: 139 mmol/L (ref 135–146)

## 2015-01-03 LAB — POCT GLYCOSYLATED HEMOGLOBIN (HGB A1C): Hemoglobin A1C: 11.7

## 2015-01-03 MED ORDER — LOSARTAN POTASSIUM-HCTZ 50-12.5 MG PO TABS: 1.0000 | ORAL_TABLET | Freq: Every day | ORAL | Status: DC

## 2015-01-03 NOTE — Progress Notes (Signed)
   Subjective:    Patient ID: [name], male    DOB: Dec 04, 1955, 59 y.o.   MRN: [id number]  HPI  CC: follow up  # Diabetes:  Lantus giving 10 units of lantus. Novolog 5 units twice a day with meals.  Has not changed his diet at all  CBG recall is around 250, checking in morning and 6pm in evening. Lowest is 79 around 6pm, didn't eat much, felt like his sugar was low. Review of CBG meter shows highest level in past 2 months in 400s, low 73. ROS: no polyuria, no polydipsia  # Hypertension  Hasn't been taking blood pressure medicine. Doesn't like how it makes him feel.  Feels like lisinopril causes pain in his legs and doesn't want to take it anymore ROS: no CP, no SOB  # Blindness of left eye  Hasn't seen opthalmologist in >2 years  Not sure what he was diagnosed in his left eye (has been on cosopt drops so presumably glaucoma)  Has not been using eye drops  # Right BKA wound  Followed by wound care  Told he was on his prosthesis too much which caused the wound ROS: no fevers  Social Hx: never smoker  Review of Systems   See HPI for ROS.   Past medical history, surgical, family, and social history reviewed and updated in the EMR as appropriate. Objective:  BP 185/108 mmHg  Pulse 127  Temp(Src) 98.2 F (36.8 C) (Oral)  Ht 6' (1.829 m)  Wt 230 lb (104.327 kg)  BMI 31.19 kg/m2 Vitals and nursing note reviewed  General: NAD Eyes: left eye cloudy appearing cornea with what looks like epithelialization of the cornea CV: Tachycardic, regular rhythm, normal heart sounds, no murmur appreciated. 2+ radial pulses bilaterally Resp: ctab, nl effort Ext: right BKA, no edema Skin: large ulcerative lesion right BKA stump with bandage covering  Assessment & Plan:  DM (diabetes mellitus), type 2, uncontrolled Despite not following prescribed insulin regimen he actually has the lowest A1c he has had since 2010, 11.7. He has already exhibited multiple complications  from his DM. CBG meter reviewed and show can likely increase his lantus dosing, recommended he take the 20 units as prescribed (though in the past he has shown that he basically chooses his own dosing regardless of instructions). Follow up 3 months.  Blindness of left eye Suspected glaucoma. Stopped seeing eye doctor and eye drops. Was originally seeing a doctor in [place]. New referral made to opthalmology.  BKA stump complication Continues to follow with wound care center. Does not appear infected currently however with his persistently uncontrolled sugars I am very doubtful that this will ever heal. Counseled him regarding possibility of higher amputation if wound does not heal.  HYPERTENSION, BENIGN SYSTEMIC Not at goal, not taking medicine. He reports lisinopril causing him leg pain (unusual adverse effect). Check BMP today. Discussed trying new medication (choosing hyzaar to hopefully help with compliance with 2 agents once daily dosing in a single pill). Fu in 1 month.

## 2015-01-03 NOTE — Patient Instructions (Signed)
Bring your blood sugar meter with you to each visit.  Stop the lisinopril. Start taking the new medicine losartan-hydrochlorothiazide once a day

## 2015-01-04 DIAGNOSIS — H5440 Blindness, one eye, unspecified eye: Secondary | ICD-10-CM | POA: Insufficient documentation

## 2015-01-04 DIAGNOSIS — H547 Unspecified visual loss: Secondary | ICD-10-CM | POA: Insufficient documentation

## 2015-01-04 NOTE — Assessment & Plan Note (Signed)
Despite not following prescribed insulin regimen he actually has the lowest A1c he has had since 2010, 11.7. He has already exhibited multiple complications from his DM. CBG meter reviewed and show can likely increase his lantus dosing, recommended he take the 20 units as prescribed (though in the past he has shown that he basically chooses his own dosing regardless of instructions). Follow up 3 months.

## 2015-01-05 ENCOUNTER — Encounter (HOSPITAL_BASED_OUTPATIENT_CLINIC_OR_DEPARTMENT_OTHER): Payer: Medicaid Other | Attending: Surgery

## 2015-01-05 DIAGNOSIS — Z9114 Patient's other noncompliance with medication regimen: Secondary | ICD-10-CM | POA: Diagnosis not present

## 2015-01-05 DIAGNOSIS — Z87891 Personal history of nicotine dependence: Secondary | ICD-10-CM | POA: Insufficient documentation

## 2015-01-05 DIAGNOSIS — Z89511 Acquired absence of right leg below knee: Secondary | ICD-10-CM | POA: Insufficient documentation

## 2015-01-05 DIAGNOSIS — E11622 Type 2 diabetes mellitus with other skin ulcer: Secondary | ICD-10-CM | POA: Diagnosis not present

## 2015-01-05 DIAGNOSIS — E1165 Type 2 diabetes mellitus with hyperglycemia: Secondary | ICD-10-CM | POA: Diagnosis not present

## 2015-01-05 DIAGNOSIS — T8789 Other complications of amputation stump: Secondary | ICD-10-CM | POA: Insufficient documentation

## 2015-01-05 DIAGNOSIS — Z794 Long term (current) use of insulin: Secondary | ICD-10-CM | POA: Insufficient documentation

## 2015-01-05 DIAGNOSIS — I10 Essential (primary) hypertension: Secondary | ICD-10-CM | POA: Diagnosis not present

## 2015-01-05 DIAGNOSIS — Y835 Amputation of limb(s) as the cause of abnormal reaction of the patient, or of later complication, without mention of misadventure at the time of the procedure: Secondary | ICD-10-CM | POA: Diagnosis not present

## 2015-01-05 DIAGNOSIS — L97812 Non-pressure chronic ulcer of other part of right lower leg with fat layer exposed: Secondary | ICD-10-CM | POA: Diagnosis not present

## 2015-01-05 DIAGNOSIS — E1140 Type 2 diabetes mellitus with diabetic neuropathy, unspecified: Secondary | ICD-10-CM | POA: Insufficient documentation

## 2015-01-05 NOTE — Assessment & Plan Note (Signed)
Continues to follow with wound care center. Does not appear infected currently however with his persistently uncontrolled sugars I am very doubtful that this will ever heal. Counseled him regarding possibility of higher amputation if wound does not heal.

## 2015-01-05 NOTE — Assessment & Plan Note (Signed)
Not at goal, not taking medicine. He reports lisinopril causing him leg pain (unusual adverse effect). Check BMP today. Discussed trying new medication (choosing hyzaar to hopefully help with compliance with 2 agents once daily dosing in a single pill). Fu in 1 month.

## 2015-01-05 NOTE — Assessment & Plan Note (Signed)
Suspected glaucoma. Stopped seeing eye doctor and eye drops. Was originally seeing a doctor in [place]. New referral made to opthalmology.

## 2015-01-06 ENCOUNTER — Encounter: Payer: Self-pay | Admitting: Family Medicine

## 2015-01-08 ENCOUNTER — Other Ambulatory Visit: Payer: Self-pay | Admitting: Family Medicine

## 2015-01-10 MED ORDER — BRIMONIDINE TARTRATE 0.2 % OP SOLN: OPHTHALMIC | Status: DC

## 2015-01-10 NOTE — Addendum Note (Signed)
Addended by: Valerie Roys on: 01/10/2015 01:38 PM   Modules accepted: Orders

## 2015-01-10 NOTE — Telephone Encounter (Signed)
Medication failed first time and resent. Jazmin Hartsell,CMA

## 2015-01-12 DIAGNOSIS — E11622 Type 2 diabetes mellitus with other skin ulcer: Secondary | ICD-10-CM | POA: Diagnosis not present

## 2015-01-19 DIAGNOSIS — E11622 Type 2 diabetes mellitus with other skin ulcer: Secondary | ICD-10-CM | POA: Diagnosis not present

## 2015-01-26 DIAGNOSIS — E11622 Type 2 diabetes mellitus with other skin ulcer: Secondary | ICD-10-CM | POA: Diagnosis not present

## 2015-02-02 DIAGNOSIS — E11622 Type 2 diabetes mellitus with other skin ulcer: Secondary | ICD-10-CM | POA: Diagnosis not present

## 2015-02-09 ENCOUNTER — Encounter (HOSPITAL_BASED_OUTPATIENT_CLINIC_OR_DEPARTMENT_OTHER): Payer: Medicaid Other | Attending: Surgery

## 2015-02-09 DIAGNOSIS — Z9114 Patient's other noncompliance with medication regimen: Secondary | ICD-10-CM | POA: Diagnosis not present

## 2015-02-09 DIAGNOSIS — E1165 Type 2 diabetes mellitus with hyperglycemia: Secondary | ICD-10-CM | POA: Insufficient documentation

## 2015-02-09 DIAGNOSIS — T8789 Other complications of amputation stump: Secondary | ICD-10-CM | POA: Insufficient documentation

## 2015-02-09 DIAGNOSIS — L97812 Non-pressure chronic ulcer of other part of right lower leg with fat layer exposed: Secondary | ICD-10-CM | POA: Diagnosis not present

## 2015-02-09 DIAGNOSIS — Z89511 Acquired absence of right leg below knee: Secondary | ICD-10-CM | POA: Insufficient documentation

## 2015-02-09 DIAGNOSIS — Z87891 Personal history of nicotine dependence: Secondary | ICD-10-CM | POA: Diagnosis not present

## 2015-02-09 DIAGNOSIS — E1140 Type 2 diabetes mellitus with diabetic neuropathy, unspecified: Secondary | ICD-10-CM | POA: Diagnosis not present

## 2015-02-09 DIAGNOSIS — I10 Essential (primary) hypertension: Secondary | ICD-10-CM | POA: Insufficient documentation

## 2015-02-09 DIAGNOSIS — F329 Major depressive disorder, single episode, unspecified: Secondary | ICD-10-CM | POA: Diagnosis not present

## 2015-02-09 DIAGNOSIS — Y835 Amputation of limb(s) as the cause of abnormal reaction of the patient, or of later complication, without mention of misadventure at the time of the procedure: Secondary | ICD-10-CM | POA: Insufficient documentation

## 2015-02-09 DIAGNOSIS — E11622 Type 2 diabetes mellitus with other skin ulcer: Secondary | ICD-10-CM | POA: Insufficient documentation

## 2015-02-16 ENCOUNTER — Encounter: Payer: Self-pay | Admitting: Vascular Surgery

## 2015-02-22 ENCOUNTER — Encounter: Payer: Self-pay | Admitting: Vascular Surgery

## 2015-02-22 ENCOUNTER — Ambulatory Visit (INDEPENDENT_AMBULATORY_CARE_PROVIDER_SITE_OTHER): Payer: Medicaid Other | Admitting: Vascular Surgery

## 2015-02-22 VITALS — BP 181/110 | HR 81 | Temp 97.0°F | Resp 16 | Ht 72.0 in | Wt 230.0 lb

## 2015-02-22 DIAGNOSIS — Z89511 Acquired absence of right leg below knee: Secondary | ICD-10-CM | POA: Diagnosis not present

## 2015-02-22 DIAGNOSIS — I739 Peripheral vascular disease, unspecified: Secondary | ICD-10-CM

## 2015-02-22 NOTE — Progress Notes (Signed)
Filed Vitals:   02/22/15 1516 02/22/15 1518  BP: 173/106 181/110  Pulse: 81 81  Temp: 97 F (36.1 C)   Resp: 16   Height: 6' (1.829 m)   Weight: 230 lb (104.327 kg)   SpO2: 96%

## 2015-02-22 NOTE — Progress Notes (Signed)
Subjective:     Patient ID: [name], male   DOB: 1955/08/16, 59 y.o.   MRN: [id number]  HPI This 59 year old male was referred from the wound center for a non-healed right below-knee amputation stump. This patient who has diabetes mellitus developed a foot ulcer and in July 2015 underwent right below-knee  Amputation by Dr. Altamese [place]. He did follow up initially with Dr. Marcelino Scot but has not seen him in about a year or so. He does have a prosthesis which she wears periodically. He has no history of chills and fever. He has been going to the wound center for several months. He quit smoking 10 years ago.   Past Medical History  Diagnosis Date  . Diabetes mellitus   . Hypertension     Social History  Substance Use Topics  . Smoking status: Former Smoker    Quit date: 05/10/2000  . Smokeless tobacco: Former Systems developer    Quit date: 02/22/2000  . Alcohol Use: No    Family History  Problem Relation Age of Onset  . Cancer Mother   . Peripheral vascular disease Father     No Known Allergies   Current outpatient prescriptions:  .  brimonidine (ALPHAGAN) 0.2 % ophthalmic solution, PLACE 1 DROP INTO THE LEFT EYE 2 (TWO) TIMES DAILY., Disp: 5 mL, Rfl: 0 .  dorzolamide-timolol (COSOPT) 22.3-6.8 MG/ML ophthalmic solution, Place 1 drop into the left eye 2 (two) times daily., Disp: 10 mL, Rfl: 12 .  glucose blood (ACCU-CHEK AVIVA PLUS) test strip, Check sugar 3 times daily, Disp: 100 each, Rfl: 3 .  LANTUS 100 UNIT/ML injection, INJECT 0.2 MLS (20 UNITS TOTAL) INTO THE SKIN AT BEDTIME., Disp: 10 mL, Rfl: 3 .  losartan-hydrochlorothiazide (HYZAAR) 50-12.5 MG tablet, Take 1 tablet by mouth daily., Disp: 30 tablet, Rfl: 0 .  NOVOLOG 100 UNIT/ML injection, INJECT 5 UNITS INTO THE SKIN 3 (THREE) TIMES DAILY BEFORE MEALS. DO NOT TAKE IF SUGAR IS <150, Disp: 30 mL, Rfl: 2 .  Tetrahydrozoline HCl (VISINE OP), Apply 1 drop to eye 2 (two) times daily. Only uses in left eye., Disp: , Rfl:   Filed Vitals:    02/22/15 1516 02/22/15 1518  BP: 173/106 181/110  Pulse: 81 81  Temp: 97 F (36.1 C)   Resp: 16   Height: 6' (1.829 m)   Weight: 230 lb (104.327 kg)   SpO2: 96%     Body mass index is 31.19 kg/(m^2).          Review of Systems Denies chest pain but does have occasional orthopnea. Has temporary loss of vision in one eye, diabetes mellitus,      Objective:   Physical Exam BP 181/110 mmHg  Pulse 81  Temp(Src) 97 F (36.1 C)  Resp 16  Ht 6' (1.829 m)  Wt 230 lb (104.327 kg)  BMI 31.19 kg/m2  SpO2 96%  Gen.-alert and oriented x3 in no apparent distress HEENT normal for age Lungs no rhonchi or wheezing Cardiovascular regular rhythm no murmurs carotid pulses 3+ palpable no bruits audible Abdomen soft nontender no palpable masses Musculoskeletal free of  major deformities Skin clear -no rashes Neurologic normal Lower extremities 3+ femoral and  And popliteal pulse palpable right leg. Mid tibial below-knee amputation on the right with 2.5 x 1 cm granulating area over the distal tibia with dark hypertrophic skin extending anteriorly about 6-7 cm. No fluctuance or drainage.   left leg with 3+ femoral and 2+ popliteal pulse palpable. No gangrene or  infection left foot.       Assessment:      breakdown of right below-knee amputation stump with possible pressure sore. Hypertrophic skin surrounding chronic nonhealing ulcer. Amputation performed by Dr. Altamese Alba in July 2015   patient appears to have adequate circulation below the knee for revision to a more proximal below knee level     Plan:      we'll make patient appointment with Dr. Altamese [place] since he performed the amputation 18 months ago 4 revision of this right BKA  Discussed this plan with the patient and he is in agreement

## 2015-02-23 DIAGNOSIS — E11622 Type 2 diabetes mellitus with other skin ulcer: Secondary | ICD-10-CM | POA: Diagnosis not present

## 2015-03-02 DIAGNOSIS — L97812 Non-pressure chronic ulcer of other part of right lower leg with fat layer exposed: Secondary | ICD-10-CM | POA: Diagnosis not present

## 2015-03-02 DIAGNOSIS — E1165 Type 2 diabetes mellitus with hyperglycemia: Secondary | ICD-10-CM | POA: Diagnosis not present

## 2015-03-02 DIAGNOSIS — E11622 Type 2 diabetes mellitus with other skin ulcer: Secondary | ICD-10-CM | POA: Diagnosis not present

## 2015-03-02 DIAGNOSIS — T8789 Other complications of amputation stump: Secondary | ICD-10-CM | POA: Diagnosis not present

## 2015-03-09 ENCOUNTER — Encounter (HOSPITAL_BASED_OUTPATIENT_CLINIC_OR_DEPARTMENT_OTHER): Payer: Medicaid Other | Attending: Surgery

## 2015-03-09 DIAGNOSIS — E1140 Type 2 diabetes mellitus with diabetic neuropathy, unspecified: Secondary | ICD-10-CM | POA: Insufficient documentation

## 2015-03-09 DIAGNOSIS — E1165 Type 2 diabetes mellitus with hyperglycemia: Secondary | ICD-10-CM | POA: Diagnosis not present

## 2015-03-09 DIAGNOSIS — L97812 Non-pressure chronic ulcer of other part of right lower leg with fat layer exposed: Secondary | ICD-10-CM | POA: Insufficient documentation

## 2015-03-09 DIAGNOSIS — T8789 Other complications of amputation stump: Secondary | ICD-10-CM | POA: Diagnosis not present

## 2015-03-09 DIAGNOSIS — E11622 Type 2 diabetes mellitus with other skin ulcer: Secondary | ICD-10-CM | POA: Diagnosis present

## 2015-03-09 DIAGNOSIS — I10 Essential (primary) hypertension: Secondary | ICD-10-CM | POA: Diagnosis not present

## 2015-03-09 DIAGNOSIS — Z87891 Personal history of nicotine dependence: Secondary | ICD-10-CM | POA: Insufficient documentation

## 2015-03-09 DIAGNOSIS — Z89511 Acquired absence of right leg below knee: Secondary | ICD-10-CM | POA: Insufficient documentation

## 2015-03-09 DIAGNOSIS — L89899 Pressure ulcer of other site, unspecified stage: Secondary | ICD-10-CM | POA: Diagnosis not present

## 2015-03-09 DIAGNOSIS — Y835 Amputation of limb(s) as the cause of abnormal reaction of the patient, or of later complication, without mention of misadventure at the time of the procedure: Secondary | ICD-10-CM | POA: Diagnosis not present

## 2015-03-23 DIAGNOSIS — E11622 Type 2 diabetes mellitus with other skin ulcer: Secondary | ICD-10-CM | POA: Diagnosis not present

## 2015-03-30 DIAGNOSIS — E11622 Type 2 diabetes mellitus with other skin ulcer: Secondary | ICD-10-CM | POA: Diagnosis not present

## 2015-04-13 ENCOUNTER — Encounter (HOSPITAL_BASED_OUTPATIENT_CLINIC_OR_DEPARTMENT_OTHER): Payer: Medicaid Other | Attending: Surgery

## 2015-04-13 ENCOUNTER — Other Ambulatory Visit (HOSPITAL_COMMUNITY): Payer: Self-pay | Admitting: Orthopedic Surgery

## 2015-04-13 DIAGNOSIS — E11622 Type 2 diabetes mellitus with other skin ulcer: Secondary | ICD-10-CM | POA: Insufficient documentation

## 2015-04-13 DIAGNOSIS — E1165 Type 2 diabetes mellitus with hyperglycemia: Secondary | ICD-10-CM | POA: Diagnosis not present

## 2015-04-13 DIAGNOSIS — Y835 Amputation of limb(s) as the cause of abnormal reaction of the patient, or of later complication, without mention of misadventure at the time of the procedure: Secondary | ICD-10-CM | POA: Insufficient documentation

## 2015-04-13 DIAGNOSIS — Z89511 Acquired absence of right leg below knee: Secondary | ICD-10-CM | POA: Diagnosis not present

## 2015-04-13 DIAGNOSIS — T8789 Other complications of amputation stump: Secondary | ICD-10-CM | POA: Diagnosis not present

## 2015-04-13 DIAGNOSIS — L97811 Non-pressure chronic ulcer of other part of right lower leg limited to breakdown of skin: Secondary | ICD-10-CM | POA: Diagnosis not present

## 2015-04-19 ENCOUNTER — Encounter (HOSPITAL_COMMUNITY)
Admission: RE | Admit: 2015-04-19 | Discharge: 2015-04-19 | Disposition: A | Discharge status: Home / Self Care | Payer: Medicaid Other | Source: Ambulatory Visit | Attending: Orthopedic Surgery | Admitting: Orthopedic Surgery

## 2015-04-19 ENCOUNTER — Encounter (HOSPITAL_COMMUNITY): Payer: Self-pay

## 2015-04-19 DIAGNOSIS — Z89511 Acquired absence of right leg below knee: Secondary | ICD-10-CM | POA: Diagnosis not present

## 2015-04-19 DIAGNOSIS — Z87891 Personal history of nicotine dependence: Secondary | ICD-10-CM | POA: Insufficient documentation

## 2015-04-19 DIAGNOSIS — I10 Essential (primary) hypertension: Secondary | ICD-10-CM | POA: Diagnosis not present

## 2015-04-19 DIAGNOSIS — Z01812 Encounter for preprocedural laboratory examination: Secondary | ICD-10-CM | POA: Diagnosis not present

## 2015-04-19 DIAGNOSIS — Z01818 Encounter for other preprocedural examination: Secondary | ICD-10-CM | POA: Insufficient documentation

## 2015-04-19 DIAGNOSIS — E119 Type 2 diabetes mellitus without complications: Secondary | ICD-10-CM | POA: Diagnosis not present

## 2015-04-19 LAB — COMPREHENSIVE METABOLIC PANEL
ALT: 29 U/L (ref 17–63)
AST: 22 U/L (ref 15–41)
Albumin: 3.3 g/dL — ABNORMAL LOW (ref 3.5–5.0)
Alkaline Phosphatase: 89 U/L (ref 38–126)
Anion gap: 14 (ref 5–15)
BUN: 13 mg/dL (ref 6–20)
CO2: 24 mmol/L (ref 22–32)
Calcium: 9.6 mg/dL (ref 8.9–10.3)
Chloride: 100 mmol/L — ABNORMAL LOW (ref 101–111)
Creatinine, Ser: 1.06 mg/dL (ref 0.61–1.24)
GFR calc Af Amer: >60 mL/min (ref >60)
GFR calc non Af Amer: >60 mL/min (ref >60)
Glucose, Bld: 333 mg/dL — ABNORMAL HIGH (ref 65–99)
Potassium: 5 mmol/L (ref 3.5–5.1)
Sodium: 138 mmol/L (ref 135–145)
Total Bilirubin: 1.4 mg/dL — ABNORMAL HIGH (ref 0.3–1.2)
Total Protein: 6.9 g/dL (ref 6.5–8.1)

## 2015-04-19 LAB — CBC
HCT: 45.7 % (ref 39.0–52.0)
Hemoglobin: 15 g/dL (ref 13.0–17.0)
MCH: 25.8 pg — ABNORMAL LOW (ref 26.0–34.0)
MCHC: 32.8 g/dL (ref 30.0–36.0)
MCV: 78.7 fL (ref 78.0–100.0)
Platelets: 258 10*3/uL (ref 150–400)
RBC: 5.81 MIL/uL (ref 4.22–5.81)
RDW: 14.6 % (ref 11.5–15.5)
WBC: 7.5 10*3/uL (ref 4.0–10.5)

## 2015-04-19 LAB — PROTIME-INR
INR: 1.18 (ref 0.00–1.49)
Prothrombin Time: 15.2 seconds (ref 11.6–15.2)

## 2015-04-19 LAB — APTT: aPTT: 33 seconds (ref 24–37)

## 2015-04-19 LAB — GLUCOSE, CAPILLARY: Glucose-Capillary: 255 mg/dL — ABNORMAL HIGH (ref 65–99)

## 2015-04-19 MED ORDER — CEFAZOLIN SODIUM-DEXTROSE 2-3 GM-% IV SOLR
2.0000 g | INTRAVENOUS | Status: AC
  Administered 2015-04-20: 2 g via INTRAVENOUS
  Filled 2015-04-19: qty 50

## 2015-04-19 MED ORDER — CHLORHEXIDINE GLUCONATE 4 % EX LIQD: 60.0000 mL | Freq: Once | CUTANEOUS | Status: DC

## 2015-04-19 NOTE — Progress Notes (Signed)
Anesthesia Chart Review:  Pt is a 60 year old male scheduled for revision R BKA, apply wound vac on 04/20/2015 with Dr. Sharol Given.   PMH includes:  HTN, DM. Former smoker. BMI 29. S/p R BKA 2015.   Preoperative labs reviewed.  Glucose 333.   EKG 04/19/15: NSR. Possible LAE. LAFB. LVH. Nonspecific T wave abnormality. Prolonged QT  PCP is Dr. Tawanna Sat at [facility]. By notes, pt's DM is consistently poorly controlled. If glucose acceptable DOS, I anticipate pt can proceed as scheduled.   Willeen Cass, FNP-BC [facility]/Anesthesiology Phone: [phone] 04/19/2015 4:37 PM

## 2015-04-19 NOTE — Pre-Procedure Instructions (Addendum)
[name]  04/19/2015      [id number] [place], [place] - [id number] [place] [id number] Phone: [phone] Fax: [phone]    Your procedure is scheduled on Wednesday Apr 20 2015.  Report to [facility] Admitting at 630 A.M.  Call this number if you have problems the morning of surgery:  [phone]   Remember:  Do not eat food or drink liquids after midnight tonight.  Take these medicines the morning of surgery with A SIP OF WATER none STOP: ALL Vitamins, Supplements, Effient and Herbal Medications, Fish Oils, Aspirins, NSAIDs (Nonsteroidal Anti-inflammatories such as Ibuprofen, Aleve, or Advil), and Goody's/BC Powders 7 days prior to surgery, until after surgery as directed by your physician.    Do not wear jewelry.  Do not wear lotions, powders, or perfumes.  You may wear deodorant.  Men may shave face and neck.  Do not bring valuables to the hospital.  [facility] is not responsible for any belongings or valuables.  How to Manage Your Diabetes Before Surgery   Why is it important to control my blood sugar before and after surgery?   Improving blood sugar levels before and after surgery helps healing and can limit problems.  A way of improving blood sugar control is eating a healthy diet by:  - Eating less sugar and carbohydrates  - Increasing activity/exercise  - Talk with your doctor about reaching your blood sugar goals  High blood sugars (greater than 180 mg/dL) can raise your risk of infections and slow down your recovery so you will need to focus on controlling your diabetes during the weeks before surgery.  Make sure that the doctor who takes care of your diabetes knows about your planned surgery including the date and location.  How do I manage my blood sugars before surgery?   Check your blood sugar at least 4 times a day, 2 days before surgery to make  sure that they are not too high or low.   Check your blood sugar the morning of your surgery when you wake up and every 2               hours until you get to the Short-Stay unit.  If your blood sugar is less than 70 mg/dL, you will need to treat for low blood sugar by:  Treat a low blood sugar (less than 70 mg/dL) with 1/2 cup of clear juice (cranberry or apple), 4 glucose tablets, OR glucose gel.  Recheck blood sugar in 15 minutes after treatment (to make sure it is greater than 70 mg/dL).  If blood sugar is not greater than 70 mg/dL on re-check, call [phone] for further instructions.   Report your blood sugar to the Short-Stay nurse when you get to Short-Stay.  References:  [facility], 2007 "How to Manage your Diabetes Before and After Surgery".  What do I do about my diabetes medications?   Do not take oral diabetes medicines (pills) the morning of surgery.  THE NIGHT BEFORE SURGERY, take 16 units of lantus glargine Insulin.    THE MORNING OF SURGERY, take 10 units of lantus glargine Insulin.    Do not take other diabetes injectables the day of surgery including Byetta, Victoza, Bydureon, and Trulicity.    If your CBG is greater than 220 mg/dL, you may take 1/2 of your sliding scale (correction) dose of insulin.  For patients with "Insulin Pumps":  Contact your diabetes doctor for specific instructions before surgery.   Decrease basal insulin rates by 20% at midnight the night before surgery.  Note that if your surgery is planned to be longer than 2 hours, your insulin pump will be removed and intravenous (IV) insulin will be started and managed by the nurses and anesthesiologist.  You will be able to restart your insulin pump once you are awake and able to manage it.  Make sure to bring insulin pump supplies to the hospital with you in case your site needs to be changed.        Contacts, dentures or bridgework may not be worn  into surgery.  Leave your suitcase in the car.  After surgery it may be brought to your room.  For patients admitted to the hospital, discharge time will be determined by your treatment team.  Patients discharged the day of surgery will not be allowed to drive home.        Preparing for Surgery at [facility]  Before surgery, you can play an important role.  Because skin is not sterile, your skin needs to be as free of germs as possible.  You can reduce the number of germs on your skin by washing with CHG (chlorahexidine gluconate) Soap before surgery.  CHG is an antiseptic cleaner with kills germs and bonds with the skin to continue killing germs even after washing.   Please do not use if you have an allergy to CHG or antibacterial soaps.  If your skin becomes reddened/irritated stop using the CHG.  Do not shave (including legs and underarms) for at least 48 hours prior to first CHG shower.  It is okay to shave your face.  Please follow these instructions carefully:  1. Shower with CHG Soap the night before surgery and the morning of Surgery. 2. If you choose to wash your hair, wash your hair first as usual with your normal shampoo. 3. After you shampoo, rinse your hair and body thoroughly to remove the Shampoo. 4. Use CHG as you would any other liquid soap. You can apply chg directly to the skin and wash gently with scrungie or a clean washcloth. 5. Apply the CHG Soap to your body ONLY FROM THE NECK DOWN. Do not use on open wounds or open sores. Avoid contact with your eyes, ears, mouth and genitals (private parts). Wash genitals (private parts) with your normal soap. 6. Wash thoroughly, paying special attention to the area where your surgery will be performed. 7. Thoroughly rinse your body with warm water from the neck down. 8. DO NOT shower/wash with your normal soap after using and  rinsing off the CHG Soap. 9. Pat yourself dry with a clean towel.  10. Wear clean pajamas.  11. Place clean sheets on your bed the night of your first shower and do not sleep with pets.  Day of Surgery  Do not apply any lotions/deodorants the morning of surgery. Please wear clean clothes to the hospital/surgery center.   Please read over the following fact sheets that you were given. Pain Booklet, Coughing and Deep Breathing, MRSA Information and Surgical Site Infection Prevention

## 2015-04-19 NOTE — Progress Notes (Signed)
   04/19/15 1433  OBSTRUCTIVE SLEEP APNEA  Have you ever been diagnosed with sleep apnea through a sleep study? No  Do you snore loudly (loud enough to be heard through closed doors)?  0  Do you often feel tired, fatigued, or sleepy during the daytime (such as falling asleep during driving or talking to someone)? 1  Has anyone observed you stop breathing during your sleep? 0  Do you have, or are you being treated for high blood pressure? 1  BMI more than 35 kg/m2? 0  Age > 50 (1-yes) 1  Neck circumference greater than:Male 16 inches or larger, Male 17inches or larger? 1 (73.5)  Male Gender (Yes=1) 1  Obstructive Sleep Apnea Score 5  Score 5 or greater  Results sent to PCP

## 2015-04-20 ENCOUNTER — Emergency Department (HOSPITAL_COMMUNITY)
Admission: EM | Admit: 2015-04-20 | Discharge: 2015-04-20 | Disposition: A | Discharge status: Home / Self Care | Payer: Medicaid Other | Attending: Emergency Medicine | Admitting: Emergency Medicine

## 2015-04-20 ENCOUNTER — Encounter (HOSPITAL_COMMUNITY)
Admission: RE | Disposition: A | Discharge status: Home / Self Care | Payer: Self-pay | Source: Ambulatory Visit | Attending: Orthopedic Surgery

## 2015-04-20 ENCOUNTER — Ambulatory Visit (HOSPITAL_COMMUNITY): Payer: Medicaid Other | Admitting: Emergency Medicine

## 2015-04-20 ENCOUNTER — Encounter (HOSPITAL_COMMUNITY): Payer: Self-pay | Admitting: Emergency Medicine

## 2015-04-20 ENCOUNTER — Encounter (HOSPITAL_COMMUNITY): Payer: Self-pay | Admitting: Certified Registered Nurse Anesthetist

## 2015-04-20 ENCOUNTER — Ambulatory Visit (HOSPITAL_COMMUNITY): Payer: Medicaid Other | Admitting: Certified Registered Nurse Anesthetist

## 2015-04-20 ENCOUNTER — Ambulatory Visit (HOSPITAL_COMMUNITY)
Admission: RE | Admit: 2015-04-20 | Discharge: 2015-04-20 | Disposition: A | Discharge status: Home / Self Care | Payer: Medicaid Other | Source: Ambulatory Visit | Attending: Orthopedic Surgery | Admitting: Orthopedic Surgery

## 2015-04-20 DIAGNOSIS — E119 Type 2 diabetes mellitus without complications: Secondary | ICD-10-CM | POA: Insufficient documentation

## 2015-04-20 DIAGNOSIS — Z87891 Personal history of nicotine dependence: Secondary | ICD-10-CM | POA: Insufficient documentation

## 2015-04-20 DIAGNOSIS — S81801A Unspecified open wound, right lower leg, initial encounter: Secondary | ICD-10-CM

## 2015-04-20 DIAGNOSIS — E1140 Type 2 diabetes mellitus with diabetic neuropathy, unspecified: Secondary | ICD-10-CM | POA: Diagnosis not present

## 2015-04-20 DIAGNOSIS — M868X6 Other osteomyelitis, lower leg: Secondary | ICD-10-CM | POA: Insufficient documentation

## 2015-04-20 DIAGNOSIS — Z79899 Other long term (current) drug therapy: Secondary | ICD-10-CM | POA: Insufficient documentation

## 2015-04-20 DIAGNOSIS — I10 Essential (primary) hypertension: Secondary | ICD-10-CM | POA: Diagnosis not present

## 2015-04-20 DIAGNOSIS — M96830 Postprocedural hemorrhage and hematoma of a musculoskeletal structure following a musculoskeletal system procedure: Secondary | ICD-10-CM | POA: Insufficient documentation

## 2015-04-20 DIAGNOSIS — Z794 Long term (current) use of insulin: Secondary | ICD-10-CM | POA: Diagnosis not present

## 2015-04-20 HISTORY — PX: STUMP REVISION: SHX6102

## 2015-04-20 LAB — GLUCOSE, CAPILLARY
Glucose-Capillary: 195 mg/dL — ABNORMAL HIGH (ref 65–99)
Glucose-Capillary: 176 mg/dL — ABNORMAL HIGH (ref 65–99)

## 2015-04-20 SURGERY — REVISION, AMPUTATION SITE
Anesthesia: General | Laterality: Right

## 2015-04-20 MED ORDER — LIDOCAINE HCL (CARDIAC) 20 MG/ML IV SOLN
INTRAVENOUS | Status: DC | PRN
  Administered 2015-04-20: 60 mg via INTRATRACHEAL

## 2015-04-20 MED ORDER — PROPOFOL 10 MG/ML IV BOLUS
INTRAVENOUS | Status: AC
  Filled 2015-04-20: qty 20

## 2015-04-20 MED ORDER — SODIUM CHLORIDE 0.9 % IV SOLN
INTRAVENOUS | Status: DC | PRN
  Administered 2015-04-20 (×2): via INTRAVENOUS

## 2015-04-20 MED ORDER — ONDANSETRON HCL 4 MG/2ML IJ SOLN
INTRAMUSCULAR | Status: AC
  Filled 2015-04-20: qty 2

## 2015-04-20 MED ORDER — OXYCODONE HCL 5 MG PO TABS: 5.0000 mg | ORAL_TABLET | Freq: Once | ORAL | Status: DC | PRN

## 2015-04-20 MED ORDER — FENTANYL CITRATE (PF) 250 MCG/5ML IJ SOLN
INTRAMUSCULAR | Status: DC | PRN
  Administered 2015-04-20: 25 ug via INTRAVENOUS

## 2015-04-20 MED ORDER — PROPOFOL 10 MG/ML IV BOLUS
INTRAVENOUS | Status: DC | PRN
  Administered 2015-04-20: 200 mg via INTRAVENOUS

## 2015-04-20 MED ORDER — HYDROMORPHONE HCL 1 MG/ML IJ SOLN: 0.2500 mg | INTRAMUSCULAR | Status: DC | PRN

## 2015-04-20 MED ORDER — ACETAMINOPHEN 325 MG PO TABS: 325.0000 mg | ORAL_TABLET | ORAL | Status: DC | PRN

## 2015-04-20 MED ORDER — 0.9 % SODIUM CHLORIDE (POUR BTL) OPTIME
TOPICAL | Status: DC | PRN
  Administered 2015-04-20: 1000 mL

## 2015-04-20 MED ORDER — OXYCODONE HCL 5 MG/5ML PO SOLN: 5.0000 mg | Freq: Once | ORAL | Status: DC | PRN

## 2015-04-20 MED ORDER — ONDANSETRON HCL 4 MG/2ML IJ SOLN
INTRAMUSCULAR | Status: DC | PRN
  Administered 2015-04-20: 4 mg via INTRAVENOUS

## 2015-04-20 MED ORDER — FENTANYL CITRATE (PF) 250 MCG/5ML IJ SOLN
INTRAMUSCULAR | Status: AC
  Filled 2015-04-20: qty 5

## 2015-04-20 MED ORDER — EPHEDRINE SULFATE 50 MG/ML IJ SOLN
INTRAMUSCULAR | Status: AC
  Filled 2015-04-20: qty 1

## 2015-04-20 MED ORDER — EPHEDRINE SULFATE 50 MG/ML IJ SOLN
INTRAMUSCULAR | Status: DC | PRN
  Administered 2015-04-20 (×2): 15 mg via INTRAVENOUS

## 2015-04-20 MED ORDER — HYDROCODONE-ACETAMINOPHEN 5-325 MG PO TABS: 1.0000 | ORAL_TABLET | Freq: Four times a day (QID) | ORAL | Status: DC | PRN

## 2015-04-20 MED ORDER — GLYCOPYRROLATE 0.2 MG/ML IJ SOLN
INTRAMUSCULAR | Status: AC
  Filled 2015-04-20: qty 1

## 2015-04-20 MED ORDER — LIDOCAINE HCL (CARDIAC) 20 MG/ML IV SOLN
INTRAVENOUS | Status: AC
  Filled 2015-04-20: qty 5

## 2015-04-20 MED ORDER — ACETAMINOPHEN 160 MG/5ML PO SOLN
325.0000 mg | ORAL | Status: DC | PRN
  Filled 2015-04-20: qty 20.3

## 2015-04-20 MED ORDER — GLYCOPYRROLATE 0.2 MG/ML IJ SOLN
INTRAMUSCULAR | Status: DC | PRN
  Administered 2015-04-20: 0.1 mg via INTRAVENOUS

## 2015-04-20 SURGICAL SUPPLY — 56 items
BANDAGE ESMARK 6X9 LF (GAUZE/BANDAGES/DRESSINGS) ×1 IMPLANT
BLADE SAW RECIP 87.9 MT (BLADE) IMPLANT
BNDG CMPR 9X6 STRL LF SNTH (GAUZE/BANDAGES/DRESSINGS) ×1
BNDG COHESIVE 6X5 TAN STRL LF (GAUZE/BANDAGES/DRESSINGS) ×4 IMPLANT
BNDG ESMARK 6X9 LF (GAUZE/BANDAGES/DRESSINGS) ×3
BNDG GAUZE STRTCH 6 (GAUZE/BANDAGES/DRESSINGS) IMPLANT
COVER SURGICAL LIGHT HANDLE (MISCELLANEOUS) ×6 IMPLANT
CUFF TOURNIQUET SINGLE 34IN LL (TOURNIQUET CUFF) IMPLANT
DRAIN PENROSE 1/2X12 LTX STRL (WOUND CARE) IMPLANT
DRAPE EXTREMITY T 121X128X90 (DRAPE) ×3 IMPLANT
DRAPE INCISE IOBAN 66X45 STRL (DRAPES) ×2 IMPLANT
DRAPE PROXIMA HALF (DRAPES) ×6 IMPLANT
DRAPE U-SHAPE 47X51 STRL (DRAPES) ×6 IMPLANT
DRSG ADAPTIC 3X8 NADH LF (GAUZE/BANDAGES/DRESSINGS) ×3 IMPLANT
DRSG KUZMA FLUFF (GAUZE/BANDAGES/DRESSINGS) ×4 IMPLANT
DRSG PAD ABDOMINAL 8X10 ST (GAUZE/BANDAGES/DRESSINGS) ×4 IMPLANT
DURAPREP 26ML APPLICATOR (WOUND CARE) ×3 IMPLANT
ELECT CAUTERY BLADE 6.4 (BLADE) IMPLANT
ELECT REM PT RETURN 9FT ADLT (ELECTROSURGICAL) ×3
ELECTRODE REM PT RTRN 9FT ADLT (ELECTROSURGICAL) ×1 IMPLANT
EVACUATOR 1/8 PVC DRAIN (DRAIN) IMPLANT
GAUZE SPONGE 4X4 12PLY STRL (GAUZE/BANDAGES/DRESSINGS) ×3 IMPLANT
GLOVE BIOGEL PI IND STRL 6.5 (GLOVE) IMPLANT
GLOVE BIOGEL PI IND STRL 7.0 (GLOVE) IMPLANT
GLOVE BIOGEL PI IND STRL 9 (GLOVE) ×1 IMPLANT
GLOVE BIOGEL PI INDICATOR 6.5 (GLOVE) ×6
GLOVE BIOGEL PI INDICATOR 7.0 (GLOVE) ×4
GLOVE BIOGEL PI INDICATOR 9 (GLOVE) ×2
GLOVE SURG ORTHO 9.0 STRL STRW (GLOVE) ×3 IMPLANT
GOWN STRL REUS W/ TWL XL LVL3 (GOWN DISPOSABLE) ×2 IMPLANT
GOWN STRL REUS W/TWL XL LVL3 (GOWN DISPOSABLE) ×6
KIT BASIN OR (CUSTOM PROCEDURE TRAY) ×3 IMPLANT
KIT ROOM TURNOVER OR (KITS) ×3 IMPLANT
MANIFOLD NEPTUNE II (INSTRUMENTS) ×3 IMPLANT
NS IRRIG 1000ML POUR BTL (IV SOLUTION) ×3 IMPLANT
PACK GENERAL/GYN (CUSTOM PROCEDURE TRAY) ×3 IMPLANT
PAD ARMBOARD 7.5X6 YLW CONV (MISCELLANEOUS) ×6 IMPLANT
PAD CAST 4YDX4 CTTN HI CHSV (CAST SUPPLIES) ×1 IMPLANT
PADDING CAST COTTON 4X4 STRL (CAST SUPPLIES) ×3
PADDING CAST COTTON 6X4 STRL (CAST SUPPLIES) ×3 IMPLANT
PREVENA INCISION MGT 90 150 (MISCELLANEOUS) ×2 IMPLANT
SPONGE GAUZE 4X4 12PLY STER LF (GAUZE/BANDAGES/DRESSINGS) ×2 IMPLANT
SPONGE LAP 18X18 X RAY DECT (DISPOSABLE) IMPLANT
STAPLER VISISTAT 35W (STAPLE) IMPLANT
STOCKINETTE IMPERVIOUS LG (DRAPES) IMPLANT
SUT ETHILON 2 0 PSLX (SUTURE) ×4 IMPLANT
SUT PDS AB 1 CT  36 (SUTURE)
SUT PDS AB 1 CT 36 (SUTURE) IMPLANT
SUT SILK 2 0 (SUTURE) ×3
SUT SILK 2-0 18XBRD TIE 12 (SUTURE) ×1 IMPLANT
SUT VIC AB 1 CTX 36 (SUTURE)
SUT VIC AB 1 CTX36XBRD ANBCTR (SUTURE) IMPLANT
TOWEL OR 17X24 6PK STRL BLUE (TOWEL DISPOSABLE) ×3 IMPLANT
TOWEL OR 17X26 10 PK STRL BLUE (TOWEL DISPOSABLE) ×3 IMPLANT
TUBE ANAEROBIC SPECIMEN COL (MISCELLANEOUS) IMPLANT
WATER STERILE IRR 1000ML POUR (IV SOLUTION) ×3 IMPLANT

## 2015-04-20 NOTE — Anesthesia Procedure Notes (Signed)
Procedure Name: LMA Insertion Date/Time: 04/20/2015 9:05 AM Performed by: Collier Bullock Pre-anesthesia Checklist: Patient identified, Emergency Drugs available, Suction available and Patient being monitored Patient Re-evaluated:Patient Re-evaluated prior to inductionOxygen Delivery Method: Circle system utilized Preoxygenation: Pre-oxygenation with 100% oxygen Intubation Type: IV induction LMA: LMA inserted LMA Size: 5.0 Number of attempts: 1 Dental Injury: Teeth and Oropharynx as per pre-operative assessment

## 2015-04-20 NOTE — Op Note (Signed)
04/20/2015  9:14 AM  PATIENT:  [name]    PRE-OPERATIVE DIAGNOSIS:  Osteomyelitis Right Below Knee Amputation  POST-OPERATIVE DIAGNOSIS:  Same  PROCEDURE:  Revision Right Below Knee Amputation, Apply Wound VAC  SURGEON:  DUDA,MARCUS V, MD  PHYSICIAN ASSISTANT:None ANESTHESIA:   General  PREOPERATIVE INDICATIONS:  [name] is a  60 y.o. male with a diagnosis of Osteomyelitis Right Below Knee Amputation who failed conservative measures and elected for surgical management.    The risks benefits and alternatives were discussed with the patient preoperatively including but not limited to the risks of infection, bleeding, nerve injury, cardiopulmonary complications, the need for revision surgery, among others, and the patient was willing to proceed.  OPERATIVE IMPLANTS: Prevena wound VAC plus  OPERATIVE FINDINGS: good bleeding no abscess.  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 The ulcer communicates with bone and patient presents at this time for revision amputation. Risk and benefits were discussed including infection neurovascular injury nonhealing the wound need for additional surgery. Patient states he understands and wishes to proceed at this time.  Patient was brought to the operating room and underwent a general anesthetic. After adequate levels anesthesia obtained patient's right lower extremity was prepped using DuraPrep draped into a sterile field. A timeout was called. Elliptical incision was made around the ulcer. This was carried sharply down to bone. The distal 2 cm of the tibia was resected in the anterior aspect was beveled. Electrocautery was used for hemostasis vascular bundles were suture ligated with 2-0 silk. Hemostasis was obtained. The wound was closed using 2-0 nylon. A Prevena plus wound VAC was applied this had a  good suction fit patient was extubated taken to the PACU in stable condition plan for discharge to home follow-up in the office in 1 week

## 2015-04-20 NOTE — H&P (Signed)
[name] is an 60 y.o. male.   Chief Complaint: Dehiscence right transtibial amputation. HPI: Patient is a 60 year old gentleman with diabetic insensate neuropathy who is undergone limb salvage with transtibial amputation. Patient has had progressive ulceration and breakdown of the residual limb and presents at this time for revision of the amputation.  Past Medical History  Diagnosis Date  . Diabetes mellitus   . Hypertension     Past Surgical History  Procedure Laterality Date  . I&d extremity Right 09/24/2013    Procedure: IRRIGATION AND DEBRIDEMENT RIGHT FOOT ULCER;  Surgeon: Renette Butters, MD;  Location: [place];  Service: Orthopedics;  Laterality: Right;  . I&d extremity Right 09/26/2013    Procedure: Repeat IRRIGATION AND DEBRIDEMENT Right Foot Ulcer;  Surgeon: Rozanna Box, MD;  Location: [place];  Service: Orthopedics;  Laterality: Right;  . Amputation Right 09/26/2013    Procedure: AMPUTATION BELOW KNEE;  Surgeon: Rozanna Box, MD;  Location: [place];  Service: Orthopedics;  Laterality: Right;  . Tonsillectomy    . Colonoscopy    . Multiple tooth extractions      Family History  Problem Relation Age of Onset  . Cancer Mother   . Peripheral vascular disease Father    Social History:  reports that he quit smoking about 14 years ago. He quit smokeless tobacco use about 15 years ago. He reports that he does not drink alcohol or use illicit drugs.  Allergies: No Known Allergies  Medications Prior to Admission  Medication Sig Dispense Refill  . brimonidine (ALPHAGAN) 0.2 % ophthalmic solution PLACE 1 DROP INTO THE LEFT EYE 2 (TWO) TIMES DAILY. 5 mL 0  . dorzolamide-timolol (COSOPT) 22.3-6.8 MG/ML ophthalmic solution Place 1 drop into the left eye 2 (two) times daily. 10 mL 12  . glucose blood (ACCU-CHEK AVIVA PLUS) test strip Check sugar 3 times daily 100 each 3  . LANTUS 100 UNIT/ML injection INJECT 0.2 MLS (20 UNITS TOTAL) INTO THE SKIN AT BEDTIME. 10 mL 3  .  losartan-hydrochlorothiazide (HYZAAR) 50-12.5 MG tablet Take 1 tablet by mouth daily. 30 tablet 0  . NOVOLOG 100 UNIT/ML injection INJECT 5 UNITS INTO THE SKIN 3 (THREE) TIMES DAILY BEFORE MEALS. DO NOT TAKE IF SUGAR IS <150 30 mL 2  . Tetrahydrozoline HCl (VISINE OP) Apply 1 drop to eye 2 (two) times daily. Only uses in left eye.    . vitamin C (ASCORBIC ACID) 500 MG tablet Take 500 mg by mouth daily.      Results for orders placed or performed during the hospital encounter of 04/19/15 (from the past 48 hour(s))  Glucose, capillary     Status: Abnormal   Collection Time: 04/19/15  2:22 PM  Result Value Ref Range   Glucose-Capillary 255 (H) 65 - 99 mg/dL  APTT     Status: None   Collection Time: 04/19/15  2:26 PM  Result Value Ref Range   aPTT 33 24 - 37 seconds  CBC     Status: Abnormal   Collection Time: 04/19/15  2:26 PM  Result Value Ref Range   WBC 7.5 4.0 - 10.5 K/uL   RBC 5.81 4.22 - 5.81 MIL/uL   Hemoglobin 15.0 13.0 - 17.0 g/dL   HCT 45.7 39.0 - 52.0 %   MCV 78.7 78.0 - 100.0 fL   MCH 25.8 (L) 26.0 - 34.0 pg   MCHC 32.8 30.0 - 36.0 g/dL   RDW 14.6 11.5 - 15.5 %   Platelets 258 150 -  400 K/uL  Comprehensive metabolic panel     Status: Abnormal   Collection Time: 04/19/15  2:26 PM  Result Value Ref Range   Sodium 138 135 - 145 mmol/L   Potassium 5.0 3.5 - 5.1 mmol/L   Chloride 100 (L) 101 - 111 mmol/L   CO2 24 22 - 32 mmol/L   Glucose, Bld 333 (H) 65 - 99 mg/dL   BUN 13 6 - 20 mg/dL   Creatinine, Ser 1.06 0.61 - 1.24 mg/dL   Calcium 9.6 8.9 - 10.3 mg/dL   Total Protein 6.9 6.5 - 8.1 g/dL   Albumin 3.3 (L) 3.5 - 5.0 g/dL   AST 22 15 - 41 U/L   ALT 29 17 - 63 U/L   Alkaline Phosphatase 89 38 - 126 U/L   Total Bilirubin 1.4 (H) 0.3 - 1.2 mg/dL   GFR calc non Af Amer >60 >60 mL/min   GFR calc Af Amer >60 >60 mL/min    Comment: (NOTE) The eGFR has been calculated using the CKD EPI equation. This calculation has not been validated in all clinical situations. eGFR's  persistently <60 mL/min signify possible Chronic Kidney Disease.    Anion gap 14 5 - 15  Protime-INR     Status: None   Collection Time: 04/19/15  2:26 PM  Result Value Ref Range   Prothrombin Time 15.2 11.6 - 15.2 seconds   INR 1.18 0.00 - 1.49   No results found.  Review of Systems  All other systems reviewed and are negative.   There were no vitals taken for this visit. Physical Exam  On examination patient has ulceration and painful breakdown of the soft tissue envelope of the residual limb right transtibial amputation Assessment/Plan Assessment: Dehiscence breakdown of the soft tissue envelope of the residual limb right transtibial amputation.  Plan: We'll plan for revision of the transtibial amputation. Risk and benefits were discussed including infection neurovascular injury nonhealing of the wound need for additional surgery. Patient states he understands and wishes to proceed at this time.  Newt Minion, MD 04/20/2015, 6:33 AM

## 2015-04-20 NOTE — ED Notes (Signed)
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 Pt rolled over drain tube with his wheelchair. Dressing applied by EMS. CBG 364. BP 182/130. Pt has not taken his at home meds for this.

## 2015-04-20 NOTE — Progress Notes (Signed)
Portable wound vac to right stump at 125 on arrival to pacu

## 2015-04-20 NOTE — Transfer of Care (Signed)
Immediate Anesthesia Transfer of Care Note  Patient: [name]  Procedure(s) Performed: Procedure(s): Revision Right Below Knee Amputation, Apply Wound VAC (Right)  Patient Location: PACU  Anesthesia Type:General  Level of Consciousness: awake, alert , oriented and patient cooperative  Airway & Oxygen Therapy: Patient Spontanous Breathing and Patient connected to face mask oxygen  Post-op Assessment: Report given to RN, Post -op Vital signs reviewed and stable, Patient moving all extremities X 4 and Patient able to stick tongue midline  Post vital signs: Reviewed and stable  Last Vitals:  Filed Vitals:   04/20/15 0652  BP: 175/120  Pulse: 85  Temp: [id number] C    Complications: No apparent anesthesia complications

## 2015-04-20 NOTE — Discharge Instructions (Signed)
Leave wound VAC in place until follow-up in the office.

## 2015-04-20 NOTE — Anesthesia Preprocedure Evaluation (Signed)
Anesthesia Evaluation  Patient identified by MRN, date of birth, ID band Patient awake    Reviewed: Allergy & Precautions, NPO status , Patient's Chart, lab work & pertinent test results  History of Anesthesia Complications Negative for: history of anesthetic complications  Airway Mallampati: III  TM Distance: >3 FB Neck ROM: Full    Dental  (+) Edentulous Upper, Teeth Intact   Pulmonary neg shortness of breath, neg sleep apnea, neg COPD, neg recent URI, former smoker,    breath sounds clear to auscultation       Cardiovascular hypertension, Pt. on medications (-) angina+ Peripheral Vascular Disease  (-) CAD, (-) Past MI and (-) CHF  Rhythm:Regular     Neuro/Psych PSYCHIATRIC DISORDERS Schizophrenia negative neurological ROS     GI/Hepatic negative GI ROS, Neg liver ROS,   Endo/Other  diabetes, Type 2, Insulin Dependent  Renal/GU negative Renal ROS     Musculoskeletal negative musculoskeletal ROS (+)   Abdominal   Peds  Hematology negative hematology ROS (+)   Anesthesia Other Findings   Reproductive/Obstetrics                             Anesthesia Physical Anesthesia Plan  ASA: III  Anesthesia Plan: General   Post-op Pain Management:    Induction: Intravenous  Airway Management Planned: LMA  Additional Equipment: None  Intra-op Plan:   Post-operative Plan: Extubation in OR  Informed Consent: I have reviewed the patients History and Physical, chart, labs and discussed the procedure including the risks, benefits and alternatives for the proposed anesthesia with the patient or authorized representative who has indicated his/her understanding and acceptance.   Dental advisory given  Plan Discussed with: CRNA and Surgeon  Anesthesia Plan Comments:         Anesthesia Quick Evaluation

## 2015-04-20 NOTE — Discharge Instructions (Signed)
Follow-up Monday with Dr. Sharol Given.  Call his office tomorrow for an appointment

## 2015-04-20 NOTE — ED Provider Notes (Signed)
CSN: [id number]     Arrival date & time 04/20/15  1818 History   First MD Initiated Contact with Patient 04/20/15 1830     Chief Complaint  Patient presents with  . Wound Check     (Consider location/radiation/quality/duration/timing/severity/associated sxs/prior Treatment) HPI Patient presents to the emergency department with.  Wound and that was observed today by Dr. Sharol Given who placed a wound VAC on the area and the patient rolled over the tubing with his wheelchair and pulled the draining tube loose.  The patient states that the areas been bleeding since that time.  Patient denies any nausea, vomiting, fever, weakness, or syncope Past Medical History  Diagnosis Date  . Diabetes mellitus   . Hypertension    Past Surgical History  Procedure Laterality Date  . I&d extremity Right 09/24/2013    Procedure: IRRIGATION AND DEBRIDEMENT RIGHT FOOT ULCER;  Surgeon: Renette Butters, MD;  Location: [place];  Service: Orthopedics;  Laterality: Right;  . I&d extremity Right 09/26/2013    Procedure: Repeat IRRIGATION AND DEBRIDEMENT Right Foot Ulcer;  Surgeon: Rozanna Box, MD;  Location: [place];  Service: Orthopedics;  Laterality: Right;  . Amputation Right 09/26/2013    Procedure: AMPUTATION BELOW KNEE;  Surgeon: Rozanna Box, MD;  Location: [place];  Service: Orthopedics;  Laterality: Right;  . Tonsillectomy    . Colonoscopy    . Multiple tooth extractions     Family History  Problem Relation Age of Onset  . Cancer Mother   . Peripheral vascular disease Father    Social History  Substance Use Topics  . Smoking status: Former Smoker    Quit date: 05/10/2000  . Smokeless tobacco: Former Systems developer    Quit date: 02/22/2000  . Alcohol Use: No    Review of Systems   All other systems negative except as documented in the HPI. All pertinent positives and negatives as reviewed in the HPI. Allergies  Review of patient's allergies indicates no known allergies.  Home Medications   Prior to  Admission medications   Medication Sig Start Date End Date Taking? Authorizing Provider  brimonidine (ALPHAGAN) 0.2 % ophthalmic solution PLACE 1 DROP INTO THE LEFT EYE 2 (TWO) TIMES DAILY. 01/10/15  Yes Virginia Crews, MD  dorzolamide-timolol (COSOPT) 22.3-6.8 MG/ML ophthalmic solution Place 1 drop into the left eye 2 (two) times daily. 09/28/13  Yes Virginia Crews, MD  HYDROcodone-acetaminophen (NORCO) 5-325 MG tablet Take 1 tablet by mouth every 6 (six) hours as needed. Patient taking differently: Take 1 tablet by mouth every 6 (six) hours as needed for moderate pain.  04/20/15  Yes Newt Minion, MD  LANTUS 100 UNIT/ML injection INJECT 0.2 MLS (20 UNITS TOTAL) INTO THE SKIN AT BEDTIME. 11/10/14  Yes Leone Brand, MD  losartan-hydrochlorothiazide [facility]) 50-12.5 MG tablet Take 1 tablet by mouth daily. 01/03/15  Yes Leone Brand, MD  NOVOLOG 100 UNIT/ML injection INJECT 5 UNITS INTO THE SKIN 3 (THREE) TIMES DAILY BEFORE MEALS. DO NOT TAKE IF SUGAR IS <150 09/07/14  Yes Leone Brand, MD  Tetrahydrozoline HCl (VISINE OP) Place 1 drop into the left eye 2 (two) times daily. Only uses in left eye.   Yes Historical Provider, MD  vitamin C (ASCORBIC ACID) 500 MG tablet Take 500 mg by mouth daily.   Yes Historical Provider, MD  glucose blood (ACCU-CHEK AVIVA PLUS) test strip Check sugar 3 times daily 10/20/14   Leone Brand, MD   BP 155/97 mmHg  Pulse 84  SpO2 95% Physical Exam  Constitutional: He is oriented to person, place, and time. He appears well-developed and well-nourished. No distress.  HENT:  Head: Normocephalic and atraumatic.  Eyes: Pupils are equal, round, and reactive to light.  Neck: Normal range of motion. Neck supple.  Pulmonary/Chest: Effort normal.  Musculoskeletal: He exhibits no edema.       Legs: Neurological: He is alert and oriented to person, place, and time. He exhibits normal muscle tone. Coordination normal.  Skin: Skin is warm and dry.    ED Course    Procedures (including critical care time) Labs Review Labs Reviewed - No data to display  Imaging Review No results found. I have personally reviewed and evaluated these images and lab results as part of my medical decision-making.  I spoke with Dr. Sharol Given who will see the patient on Monday vascular rapid and 4 x 4's and ABD pads along with Ace wraps.  Patient is advised return here as needed.  Patient agrees the plan and all questions were answered.   Dalia Heading, PA-C 04/20/15 2023  Medical screening examination/treatment/procedure(s) were conducted as a shared visit with non-physician practitioner(s) and myself.  I personally evaluated the patient during the encounter.   EKG Interpretation None     Status post revision of right BKA earlier today by Dr. Sharol Given with application of wound VAC. Patient accidentally pulled out his VAC.  Minimal bleeding from wound. Discussed with orthopedic surgeon. Pressure dressing only.  Nat Christen, MD 04/20/15 [phone]

## 2015-04-21 ENCOUNTER — Encounter (HOSPITAL_COMMUNITY): Payer: Self-pay | Admitting: Orthopedic Surgery

## 2015-04-22 NOTE — Anesthesia Postprocedure Evaluation (Signed)
Anesthesia Post Note  Patient: [name]  Procedure(s) Performed: Procedure(s) (LRB): Revision Right Below Knee Amputation, Apply Wound VAC (Right)  Patient location during evaluation: PACU Anesthesia Type: General Level of consciousness: awake Pain management: pain level controlled Vital Signs Assessment: post-procedure vital signs reviewed and stable Respiratory status: spontaneous breathing Cardiovascular status: stable Postop Assessment: no signs of nausea or vomiting Anesthetic complications: no    Last Vitals:  Filed Vitals:   04/20/15 0945 04/20/15 0951  BP: 147/98 154/106  Pulse:  90  Temp: 36.4 C   Resp:  18    Last Pain:  Filed Vitals:   04/20/15 0956  PainSc: 0-No pain                 CHRISTOPHER MOSER

## 2015-04-29 ENCOUNTER — Inpatient Hospital Stay (HOSPITAL_COMMUNITY)
Admission: EM | Admit: 2015-04-29 | Discharge: 2015-05-02 | DRG: 476 | Disposition: A | Discharge status: Home / Self Care | Payer: Medicaid Other | Attending: Family Medicine | Admitting: Family Medicine

## 2015-04-29 ENCOUNTER — Emergency Department (HOSPITAL_COMMUNITY)
Admission: EM | Admit: 2015-04-29 | Discharge: 2015-04-29 | Disposition: A | Discharge status: Home / Self Care | Payer: No Typology Code available for payment source

## 2015-04-29 ENCOUNTER — Encounter (HOSPITAL_COMMUNITY): Payer: Self-pay | Admitting: Nurse Practitioner

## 2015-04-29 DIAGNOSIS — W010XXA Fall on same level from slipping, tripping and stumbling without subsequent striking against object, initial encounter: Secondary | ICD-10-CM | POA: Diagnosis present

## 2015-04-29 DIAGNOSIS — E1151 Type 2 diabetes mellitus with diabetic peripheral angiopathy without gangrene: Secondary | ICD-10-CM | POA: Diagnosis present

## 2015-04-29 DIAGNOSIS — I10 Essential (primary) hypertension: Secondary | ICD-10-CM | POA: Diagnosis present

## 2015-04-29 DIAGNOSIS — Z87891 Personal history of nicotine dependence: Secondary | ICD-10-CM

## 2015-04-29 DIAGNOSIS — D509 Iron deficiency anemia, unspecified: Secondary | ICD-10-CM | POA: Diagnosis present

## 2015-04-29 DIAGNOSIS — G8918 Other acute postprocedural pain: Secondary | ICD-10-CM | POA: Insufficient documentation

## 2015-04-29 DIAGNOSIS — Z8249 Family history of ischemic heart disease and other diseases of the circulatory system: Secondary | ICD-10-CM

## 2015-04-29 DIAGNOSIS — E1142 Type 2 diabetes mellitus with diabetic polyneuropathy: Secondary | ICD-10-CM | POA: Diagnosis present

## 2015-04-29 DIAGNOSIS — E1165 Type 2 diabetes mellitus with hyperglycemia: Secondary | ICD-10-CM | POA: Diagnosis present

## 2015-04-29 DIAGNOSIS — T879 Unspecified complications of amputation stump: Secondary | ICD-10-CM

## 2015-04-29 DIAGNOSIS — N179 Acute kidney failure, unspecified: Secondary | ICD-10-CM | POA: Insufficient documentation

## 2015-04-29 DIAGNOSIS — IMO0002 Reserved for concepts with insufficient information to code with codable children: Secondary | ICD-10-CM | POA: Diagnosis present

## 2015-04-29 DIAGNOSIS — D62 Acute posthemorrhagic anemia: Secondary | ICD-10-CM | POA: Diagnosis not present

## 2015-04-29 DIAGNOSIS — Z794 Long term (current) use of insulin: Secondary | ICD-10-CM

## 2015-04-29 DIAGNOSIS — Y92008 Other place in unspecified non-institutional (private) residence as the place of occurrence of the external cause: Secondary | ICD-10-CM | POA: Diagnosis not present

## 2015-04-29 DIAGNOSIS — I739 Peripheral vascular disease, unspecified: Secondary | ICD-10-CM | POA: Diagnosis present

## 2015-04-29 DIAGNOSIS — Y835 Amputation of limb(s) as the cause of abnormal reaction of the patient, or of later complication, without mention of misadventure at the time of the procedure: Secondary | ICD-10-CM | POA: Diagnosis present

## 2015-04-29 DIAGNOSIS — E119 Type 2 diabetes mellitus without complications: Secondary | ICD-10-CM | POA: Diagnosis present

## 2015-04-29 DIAGNOSIS — H5442 Blindness, left eye, normal vision right eye: Secondary | ICD-10-CM | POA: Diagnosis present

## 2015-04-29 DIAGNOSIS — T8781 Dehiscence of amputation stump: Principal | ICD-10-CM | POA: Diagnosis present

## 2015-04-29 DIAGNOSIS — E1140 Type 2 diabetes mellitus with diabetic neuropathy, unspecified: Secondary | ICD-10-CM | POA: Diagnosis present

## 2015-04-29 DIAGNOSIS — Z9114 Patient's other noncompliance with medication regimen: Secondary | ICD-10-CM | POA: Insufficient documentation

## 2015-04-29 LAB — BASIC METABOLIC PANEL
Anion gap: 9 (ref 5–15)
BUN: 16 mg/dL (ref 6–20)
CO2: 27 mmol/L (ref 22–32)
Calcium: 9 mg/dL (ref 8.9–10.3)
Chloride: 100 mmol/L — ABNORMAL LOW (ref 101–111)
Creatinine, Ser: 1.06 mg/dL (ref 0.61–1.24)
GFR calc Af Amer: >60 mL/min (ref >60)
GFR calc non Af Amer: >60 mL/min (ref >60)
Glucose, Bld: 414 mg/dL — ABNORMAL HIGH (ref 65–99)
Potassium: 3.8 mmol/L (ref 3.5–5.1)
Sodium: 136 mmol/L (ref 135–145)

## 2015-04-29 LAB — CBC WITH DIFFERENTIAL/PLATELET
Basophils Absolute: 0 10*3/uL (ref 0.0–0.1)
Basophils Relative: 0 %
Eosinophils Absolute: 0.1 10*3/uL (ref 0.0–0.7)
Eosinophils Relative: 1 %
HCT: 37.3 % — ABNORMAL LOW (ref 39.0–52.0)
Hemoglobin: 11.9 g/dL — ABNORMAL LOW (ref 13.0–17.0)
Lymphocytes Relative: 15 %
Lymphs Abs: 1.2 10*3/uL (ref 0.7–4.0)
MCH: 24.8 pg — ABNORMAL LOW (ref 26.0–34.0)
MCHC: 31.9 g/dL (ref 30.0–36.0)
MCV: 77.9 fL — ABNORMAL LOW (ref 78.0–100.0)
Monocytes Absolute: 0.7 10*3/uL (ref 0.1–1.0)
Monocytes Relative: 9 %
Neutro Abs: 6 10*3/uL (ref 1.7–7.7)
Neutrophils Relative %: 75 %
Platelets: 329 10*3/uL (ref 150–400)
RBC: 4.79 MIL/uL (ref 4.22–5.81)
RDW: 14.2 % (ref 11.5–15.5)
WBC: 7.9 10*3/uL (ref 4.0–10.5)

## 2015-04-29 LAB — CBG MONITORING, ED: Glucose-Capillary: 319 mg/dL — ABNORMAL HIGH (ref 65–99)

## 2015-04-29 MED ORDER — INSULIN ASPART 100 UNIT/ML ~~LOC~~ SOLN
8.0000 [IU] | Freq: Once | SUBCUTANEOUS | Status: AC
  Administered 2015-04-29: 8 [IU] via SUBCUTANEOUS
  Filled 2015-04-29: qty 1

## 2015-04-29 MED ORDER — SODIUM CHLORIDE 0.9 % IV SOLN
Freq: Once | INTRAVENOUS | Status: AC
  Administered 2015-04-29: 22:00:00 via INTRAVENOUS

## 2015-04-29 MED ORDER — SODIUM CHLORIDE 0.9 % IV BOLUS (SEPSIS)
1000.0000 mL | Freq: Once | INTRAVENOUS | Status: AC
  Administered 2015-04-29: 1000 mL via INTRAVENOUS

## 2015-04-29 NOTE — ED Notes (Signed)
Pt is presented from home, recent BKA secondary to uncontrolled diabetes, reportedly had a fall from his wheelchair landing on the stump. Initial inspection reveals controlled bleeding with EMS dressing, pt is denying pain.

## 2015-04-29 NOTE — ED Notes (Signed)
[facility] called--- report on pt was given. 

## 2015-04-29 NOTE — ED Provider Notes (Signed)
CSN: [id number]     Arrival date & time 04/29/15  1749 History   First MD Initiated Contact with Patient 04/29/15 1803     Chief Complaint  Patient presents with  . BKA Stump Complication     R/t a fall     (Consider location/radiation/quality/duration/timing/severity/associated sxs/prior Treatment) HPI [name] is a 60 y.o. male with history of hypertension and diabetes, presents to emergency department complaining of bleeding and wound dehiscence to the right stump after a fall. Patient states he had a stump revision performed by Dr. Sharol Given 9 days ago. Her vision was due to nonhealing ulcer. Patient's wound was repaired with sutures. Patient states today he was getting out of his wheelchair and states he fell landing on the stump. Patient states "I pop some stitches out." Bleeding controlled with pressure. He denies any other injuries. Denies any pain to his knee. He is not on any blood thinners. No other complaints. No head injury.  Past Medical History  Diagnosis Date  . Diabetes mellitus   . Hypertension    Past Surgical History  Procedure Laterality Date  . I&d extremity Right 09/24/2013    Procedure: IRRIGATION AND DEBRIDEMENT RIGHT FOOT ULCER;  Surgeon: Renette Butters, MD;  Location: [place];  Service: Orthopedics;  Laterality: Right;  . I&d extremity Right 09/26/2013    Procedure: Repeat IRRIGATION AND DEBRIDEMENT Right Foot Ulcer;  Surgeon: Rozanna Box, MD;  Location: [place];  Service: Orthopedics;  Laterality: Right;  . Amputation Right 09/26/2013    Procedure: AMPUTATION BELOW KNEE;  Surgeon: Rozanna Box, MD;  Location: [place];  Service: Orthopedics;  Laterality: Right;  . Tonsillectomy    . Colonoscopy    . Multiple tooth extractions    . Stump revision Right 04/20/2015    Procedure: Revision Right Below Knee Amputation, Apply Wound VAC;  Surgeon: Newt Minion, MD;  Location: [place];  Service: Orthopedics;  Laterality: Right;   Family History  Problem Relation  Age of Onset  . Cancer Mother   . Peripheral vascular disease Father    Social History  Substance Use Topics  . Smoking status: Former Smoker    Quit date: 05/10/2000  . Smokeless tobacco: Former Systems developer    Quit date: 02/22/2000  . Alcohol Use: No    Review of Systems  Constitutional: Negative for fever and chills.  Respiratory: Negative for cough, chest tightness and shortness of breath.   Cardiovascular: Negative for chest pain, palpitations and leg swelling.  Gastrointestinal: Negative for nausea, vomiting, abdominal pain, diarrhea and abdominal distention.  Musculoskeletal: Positive for arthralgias. Negative for myalgias, neck pain and neck stiffness.  Skin: Positive for wound. Negative for rash.  Allergic/Immunologic: Negative for immunocompromised state.  Neurological: Negative for dizziness, weakness, light-headedness, numbness and headaches.      Allergies  Review of patient's allergies indicates no known allergies.  Home Medications   Prior to Admission medications   Medication Sig Start Date End Date Taking? Authorizing Provider  brimonidine (ALPHAGAN) 0.2 % ophthalmic solution PLACE 1 DROP INTO THE LEFT EYE 2 (TWO) TIMES DAILY. 01/10/15  Yes Virginia Crews, MD  dorzolamide-timolol (COSOPT) 22.3-6.8 MG/ML ophthalmic solution Place 1 drop into the left eye 2 (two) times daily. 09/28/13  Yes Virginia Crews, MD  glucose blood (ACCU-CHEK AVIVA PLUS) test strip Check sugar 3 times daily 10/20/14  Yes Leone Brand, MD  HYDROcodone-acetaminophen [facility]) 5-325 MG tablet Take 1 tablet by mouth every 6 (six) hours as  needed. Patient taking differently: Take 1 tablet by mouth every 6 (six) hours as needed for moderate pain.  04/20/15  Yes Newt Minion, MD  LANTUS 100 UNIT/ML injection INJECT 0.2 MLS (20 UNITS TOTAL) INTO THE SKIN AT BEDTIME. 11/10/14  Yes Leone Brand, MD  losartan-hydrochlorothiazide [facility]) 50-12.5 MG tablet Take 1 tablet by mouth daily. 01/03/15  Yes  Leone Brand, MD  NOVOLOG 100 UNIT/ML injection INJECT 5 UNITS INTO THE SKIN 3 (THREE) TIMES DAILY BEFORE MEALS. DO NOT TAKE IF SUGAR IS <150 09/07/14  Yes Leone Brand, MD  Tetrahydrozoline HCl (VISINE OP) Place 1 drop into the left eye 2 (two) times daily. Only uses in left eye.   Yes Historical Provider, MD  vitamin C (ASCORBIC ACID) 500 MG tablet Take 500 mg by mouth daily.   Yes Historical Provider, MD   BP 167/122 mmHg  Pulse 88  Temp(Src) 98.7 F (37.1 C) (Oral)  Resp 18  SpO2 97% Physical Exam  Constitutional: He is oriented to person, place, and time. He appears well-developed and well-nourished. No distress.  HENT:  Head: Normocephalic and atraumatic.  Eyes: Conjunctivae are normal.  Neck: Neck supple.  Cardiovascular: Normal rate, regular rhythm and normal heart sounds.   Pulmonary/Chest: Effort normal. No respiratory distress. He has no wheezes. He has no rales.  Musculoskeletal: He exhibits no edema.  Dehiscence of the right stump incision, incision is gaping. Hemostatic at this time. Knee joint is nontender, full range of motion of the knee.  Neurological: He is alert and oriented to person, place, and time.  Skin: Skin is warm and dry.  Nursing note and vitals reviewed.      ED Course  Procedures (including critical care time) Labs Review Labs Reviewed  CBC WITH DIFFERENTIAL/PLATELET - Abnormal; Notable for the following:    Hemoglobin 11.9 (*)    HCT 37.3 (*)    MCV 77.9 (*)    MCH 24.8 (*)    All other components within normal limits  BASIC METABOLIC PANEL - Abnormal; Notable for the following:    Chloride 100 (*)    Glucose, Bld 414 (*)    All other components within normal limits  BASIC METABOLIC PANEL - Abnormal; Notable for the following:    Glucose, Bld 206 (*)    Calcium 8.8 (*)    All other components within normal limits  CBC - Abnormal; Notable for the following:    Hemoglobin 11.1 (*)    HCT 33.6 (*)    MCV 77.6 (*)    MCH 25.6 (*)     All other components within normal limits  GLUCOSE, CAPILLARY - Abnormal; Notable for the following:    Glucose-Capillary 108 (*)    All other components within normal limits  GLUCOSE, CAPILLARY - Abnormal; Notable for the following:    Glucose-Capillary 226 (*)    All other components within normal limits  IRON AND TIBC - Abnormal; Notable for the following:    Iron 25 (*)    Saturation Ratios 8 (*)    All other components within normal limits  GLUCOSE, CAPILLARY - Abnormal; Notable for the following:    Glucose-Capillary 243 (*)    All other components within normal limits  GLUCOSE, CAPILLARY - Abnormal; Notable for the following:    Glucose-Capillary 204 (*)    All other components within normal limits  GLUCOSE, CAPILLARY - Abnormal; Notable for the following:    Glucose-Capillary 125 (*)    All other components  within normal limits  GLUCOSE, CAPILLARY - Abnormal; Notable for the following:    Glucose-Capillary 231 (*)    All other components within normal limits  CBG MONITORING, ED - Abnormal; Notable for the following:    Glucose-Capillary 319 (*)    All other components within normal limits  CBG MONITORING, ED - Abnormal; Notable for the following:    Glucose-Capillary 191 (*)    All other components within normal limits  SURGICAL PCR SCREEN  GLUCOSE, CAPILLARY  FERRITIN    Imaging Review No results found. I have personally reviewed and evaluated these images and lab results as part of my medical decision-making.   EKG Interpretation None      MDM   Final diagnoses:  Complication of amputated stump [facility])    Patient emergency room with stump dehiscence after a fall. Spoke with Dr. Sharol Given, advised admission to [facility] to medicine and he will go to surgery tomorrow morning. Please make nothing by mouth past midnight.  Blood sugar is 414, will recheck after fluids and administer insulin if needed. Pt's wound was wrapped in saline dressing.   9:11 PM Spoke with  family practice and will admit patient in except for transfer.      Jeannett Senior, PA-C 05/01/15 0142  Varney Biles, MD 05/02/15 [phone]

## 2015-04-29 NOTE — ED Notes (Signed)
Notified RN,Allan pt. CBG 319.

## 2015-04-29 NOTE — ED Notes (Signed)
Bed: WA09 Expected date:  Expected time:  Means of arrival:  Comments: EMS- recent BKA/Fall/stump pain

## 2015-04-29 NOTE — ED Notes (Signed)
[facility] was notified of pt's transfer to [facility]. 

## 2015-04-30 ENCOUNTER — Encounter (HOSPITAL_COMMUNITY): Payer: Self-pay

## 2015-04-30 DIAGNOSIS — E1165 Type 2 diabetes mellitus with hyperglycemia: Secondary | ICD-10-CM

## 2015-04-30 LAB — BASIC METABOLIC PANEL
Anion gap: 10 (ref 5–15)
BUN: 10 mg/dL (ref 6–20)
CO2: 27 mmol/L (ref 22–32)
Calcium: 8.8 mg/dL — ABNORMAL LOW (ref 8.9–10.3)
Chloride: 106 mmol/L (ref 101–111)
Creatinine, Ser: 0.99 mg/dL (ref 0.61–1.24)
GFR calc Af Amer: >60 mL/min (ref >60)
GFR calc non Af Amer: >60 mL/min (ref >60)
Glucose, Bld: 206 mg/dL — ABNORMAL HIGH (ref 65–99)
Potassium: 4.2 mmol/L (ref 3.5–5.1)
Sodium: 143 mmol/L (ref 135–145)

## 2015-04-30 LAB — GLUCOSE, CAPILLARY
Glucose-Capillary: 95 mg/dL (ref 65–99)
Glucose-Capillary: 108 mg/dL — ABNORMAL HIGH (ref 65–99)
Glucose-Capillary: 226 mg/dL — ABNORMAL HIGH (ref 65–99)
Glucose-Capillary: 243 mg/dL — ABNORMAL HIGH (ref 65–99)
Glucose-Capillary: 204 mg/dL — ABNORMAL HIGH (ref 65–99)
Glucose-Capillary: 125 mg/dL — ABNORMAL HIGH (ref 65–99)

## 2015-04-30 LAB — CBC
HCT: 33.6 % — ABNORMAL LOW (ref 39.0–52.0)
Hemoglobin: 11.1 g/dL — ABNORMAL LOW (ref 13.0–17.0)
MCH: 25.6 pg — ABNORMAL LOW (ref 26.0–34.0)
MCHC: 33 g/dL (ref 30.0–36.0)
MCV: 77.6 fL — ABNORMAL LOW (ref 78.0–100.0)
Platelets: 304 10*3/uL (ref 150–400)
RBC: 4.33 MIL/uL (ref 4.22–5.81)
RDW: 14.3 % (ref 11.5–15.5)
WBC: 8.6 10*3/uL (ref 4.0–10.5)

## 2015-04-30 LAB — IRON AND TIBC
Iron: 25 ug/dL — ABNORMAL LOW (ref 45–182)
Saturation Ratios: 8 % — ABNORMAL LOW (ref 17.9–39.5)
TIBC: 319 ug/dL (ref 250–450)
UIBC: 294 ug/dL

## 2015-04-30 LAB — FERRITIN: Ferritin: 90 ng/mL (ref 24–336)

## 2015-04-30 LAB — CBG MONITORING, ED: Glucose-Capillary: 191 mg/dL — ABNORMAL HIGH (ref 65–99)

## 2015-04-30 LAB — SURGICAL PCR SCREEN
MRSA, PCR: NEGATIVE
Staphylococcus aureus: NEGATIVE

## 2015-04-30 MED ORDER — KCL IN DEXTROSE-NACL 20-5-0.9 MEQ/L-%-% IV SOLN
INTRAVENOUS | Status: DC
  Administered 2015-04-30: 13:00:00 via INTRAVENOUS
  Administered 2015-04-30: 1000 mL via INTRAVENOUS
  Filled 2015-04-30 (×5): qty 1000

## 2015-04-30 MED ORDER — SODIUM CHLORIDE 0.9 % IV SOLN
INTRAVENOUS | Status: DC
  Administered 2015-04-30: 1000 mL via INTRAVENOUS

## 2015-04-30 MED ORDER — INSULIN ASPART 100 UNIT/ML ~~LOC~~ SOLN
0.0000 [IU] | SUBCUTANEOUS | Status: DC
  Administered 2015-04-30 (×2): 3 [IU] via SUBCUTANEOUS
  Administered 2015-05-01: 2 [IU] via SUBCUTANEOUS
  Administered 2015-05-01: 1 [IU] via SUBCUTANEOUS
  Administered 2015-05-01: 2 [IU] via SUBCUTANEOUS
  Administered 2015-05-01: 3 [IU] via SUBCUTANEOUS
  Administered 2015-05-02 (×2): 2 [IU] via SUBCUTANEOUS

## 2015-04-30 MED ORDER — MORPHINE SULFATE (PF) 2 MG/ML IV SOLN: 1.0000 mg | INTRAVENOUS | Status: DC | PRN

## 2015-04-30 MED ORDER — ACETAMINOPHEN 325 MG PO TABS: 650.0000 mg | ORAL_TABLET | Freq: Four times a day (QID) | ORAL | Status: DC | PRN

## 2015-04-30 MED ORDER — INSULIN GLARGINE 100 UNIT/ML ~~LOC~~ SOLN
10.0000 [IU] | Freq: Every day | SUBCUTANEOUS | Status: DC
  Administered 2015-04-30 – 2015-05-02 (×3): 10 [IU] via SUBCUTANEOUS
  Filled 2015-04-30 (×3): qty 0.1

## 2015-04-30 MED ORDER — ACETAMINOPHEN 650 MG RE SUPP
650.0000 mg | Freq: Four times a day (QID) | RECTAL | Status: DC | PRN
Start: 2015-04-30 — End: 2015-05-02

## 2015-04-30 MED ORDER — DEXTROSE-NACL 5-0.9 % IV SOLN
INTRAVENOUS | Status: DC
  Administered 2015-04-30 – 2015-05-01 (×2): via INTRAVENOUS

## 2015-04-30 NOTE — H&P (Signed)
[facility] Admission History and Physical Service Pager: [phone]  Patient name: [name] Medical record number: [id number] Date of birth: [birth year] Age: 60 y.o. Gender: male  Primary Care Provider: Tawanna Sat, MD Consultants: Ortho Code Status: FULL  Chief Complaint: BKA stump wound dehiscence after a fall   Assessment and Plan: [name] is a 60 y.o. male presenting with BKA stump wound dehiscence after a fall. PMH is significant for HTN and poorly controlled DM.   Right BKA Stump wound dehiscence after fall: Per history not a syncopal event. Stump with ace bandage, no pain. - admit to teaching service, attending Dr. Ardelia Mems - Dr. Sharol Given to do surgery in AM (notified at [facility] ED) - NPO for surgery - IV Morphine PRN pain - Maintenance IVF - will consult PT/OT after surgery  DM2: A1c 11.7 (12/2014). Home meds: Lantus 20, Novolog 5 units TID with meals. Elevated CBG on presentation to [facility], given Novolog. CBG 95 - NPO - D5 NS with K to @ 15ml/hr - monitor CBG q 4 hours with sensitive SSI q 4 hrs  HTN: SBP in 150s-160s. Patient did not take med yesterday. Home Med: losartan-hctz 50-12.5mg  - will hold for now since NPO - will monitor closely   FEN/GI: NPO for surgery; D5NS with K  @125ml /hr Prophylaxis: SCD on right leg (no pharmacologic prophylaxis as patient will likely have surgery in the AM, and is currently bleeding)  Disposition: admit to teaching service   History of Present Illness:  [name] is a 60 y.o. male presenting from [facility] ED with Right BKA stump wound dehiscence after a fall. Patient had a stump revision performed by Dr. Sharol Given about 10 days ago.  Patient states that he was trying to get something from his trash, he tried to get up from his wheelchair and he slipped and fell on his stump; "some of the stiches came out". He did not hit his head. Upon questioning, he states that he may noticed palpitations and  lightheadedness during the event.  He did not loose consciousness. Is not in pain.  He had no other complaints. Denies lightheadedness, dizziness, SOB, chest pain currently. He initially presented to [facility] ED who spoke with Dr. Sharol Given. Dr. Sharol Given recommended admission to [facility] for surgery on 2/25. Patient's CBG was also noted to be 414 on admission. Patient was given 1L bolus and 8 units of Novolog. Most recent CBG 191.    Review Of Systems: Per HPI Otherwise the remainder of the systems were negative.  Patient Active Problem List   Diagnosis Date Noted  . Complication of amputated stump ([place]) 04/29/2015  . History of right below knee amputation ([place]) 02/22/2015  . PAD (peripheral artery disease) ([place]) 02/22/2015  . Blindness of left eye 01/04/2015  . Hypertensive urgency 08/06/2014  . BKA stump complication ([place]) 0000000  . Diabetic neuropathy associated with type 2 diabetes mellitus ([place]) 12/01/2013  . Hx of BKA 09/28/2013  . Cellulitis 09/19/2013  . Abdominal pain, unspecified site 09/06/2013  . Noncompliance with medications 07/13/2010  . OTHER SPEC TYPES SCHIZOPHRENIA UNSPEC CONDITION 05/24/2009  . Obesity, unspecified 06/09/2007  . DM (diabetes mellitus), type 2, uncontrolled ([place]) 05/02/2006  . HYPERTENSION, BENIGN SYSTEMIC 05/02/2006    Past Medical History: Past Medical History  Diagnosis Date  . Diabetes mellitus   . Hypertension     Past Surgical History: Past Surgical History  Procedure Laterality Date  . I&d extremity Right 09/24/2013    Procedure: IRRIGATION  AND DEBRIDEMENT RIGHT FOOT ULCER;  Surgeon: Renette Butters, MD;  Location: [place];  Service: Orthopedics;  Laterality: Right;  . I&d extremity Right 09/26/2013    Procedure: Repeat IRRIGATION AND DEBRIDEMENT Right Foot Ulcer;  Surgeon: Rozanna Box, MD;  Location: [place];  Service: Orthopedics;  Laterality: Right;  . Amputation Right 09/26/2013    Procedure: AMPUTATION BELOW KNEE;  Surgeon: Rozanna Box, MD;   Location: [place];  Service: Orthopedics;  Laterality: Right;  . Tonsillectomy    . Colonoscopy    . Multiple tooth extractions    . Stump revision Right 04/20/2015    Procedure: Revision Right Below Knee Amputation, Apply Wound VAC;  Surgeon: Newt Minion, MD;  Location: [place];  Service: Orthopedics;  Laterality: Right;    Social History: Social History  Substance Use Topics  . Smoking status: Former Smoker    Quit date: 05/10/2000  . Smokeless tobacco: Former Systems developer    Quit date: 02/22/2000  . Alcohol Use: No    Please also refer to relevant sections of EMR.  Family History: Family History  Problem Relation Age of Onset  . Cancer Mother   . Peripheral vascular disease Father     Allergies and Medications: No Known Allergies No current facility-administered medications on file prior to encounter.   Current Outpatient Prescriptions on File Prior to Encounter  Medication Sig Dispense Refill  . brimonidine (ALPHAGAN) 0.2 % ophthalmic solution PLACE 1 DROP INTO THE LEFT EYE 2 (TWO) TIMES DAILY. 5 mL 0  . dorzolamide-timolol (COSOPT) 22.3-6.8 MG/ML ophthalmic solution Place 1 drop into the left eye 2 (two) times daily. 10 mL 12  . glucose blood (ACCU-CHEK AVIVA PLUS) test strip Check sugar 3 times daily 100 each 3  . HYDROcodone-acetaminophen (NORCO) 5-325 MG tablet Take 1 tablet by mouth every 6 (six) hours as needed. (Patient taking differently: Take 1 tablet by mouth every 6 (six) hours as needed for moderate pain. ) 30 tablet 0  . LANTUS 100 UNIT/ML injection INJECT 0.2 MLS (20 UNITS TOTAL) INTO THE SKIN AT BEDTIME. 10 mL 3  . losartan-hydrochlorothiazide (HYZAAR) 50-12.5 MG tablet Take 1 tablet by mouth daily. 30 tablet 0  . NOVOLOG 100 UNIT/ML injection INJECT 5 UNITS INTO THE SKIN 3 (THREE) TIMES DAILY BEFORE MEALS. DO NOT TAKE IF SUGAR IS <150 30 mL 2  . Tetrahydrozoline HCl (VISINE OP) Place 1 drop into the left eye 2 (two) times daily. Only uses in left eye.    . vitamin C  (ASCORBIC ACID) 500 MG tablet Take 500 mg by mouth daily.      Objective: BP 161/95 mmHg  Pulse 77  Temp(Src) 98.4 F (36.9 C) (Oral)  Resp 18  Wt 98.113 kg (216 lb 4.8 oz)  SpO2 98% Exam: General: NAD Eyes: L eye cataract noted; Right eye reactive to light. EOMI ENTM: oropharynx wnl Neck: normal ROM  CV: RRR, loud S2, no murmurs, rubs, or gallops PULM: CTAB, normal effort ABD: Soft, nontender, nondistended, NABS, no organomegaly SKIN: No rash or cyanosis; warm and well-perfused EXTR: No lower extremity edema or calf tenderness; Right BKA: wrapped in ace bandage; no drainage or blood noted on bandage. Normal range of motion of right knee.  PSYCH: Mood and affect euthymic, normal rate and volume of speech NEURO: Awake, alert, no focal deficits grossly, normal speech  Labs and Imaging: CBC BMET   Recent Labs Lab 04/29/15 1852  WBC 7.9  HGB 11.9*  HCT 37.3*  PLT  329    Recent Labs Lab 04/29/15 1852  NA 136  K 3.8  CL 100*  CO2 27  BUN 16  CREATININE 1.06  GLUCOSE 414*  CALCIUM 9.0      Recent Labs Lab 04/29/15 2138 04/30/15 0028 04/30/15 0207 04/30/15 0348 04/30/15 0744  GLUCAP 319* 191* 95 108* 226*     Smiley Houseman, MD 04/30/2015, 1:39 AM PGY-1, [place] Intern pager: [phone], text pages welcome  I have read and agree with the amended note as above.  Phill Myron, MD, PGY-3 9:03 AM

## 2015-04-30 NOTE — Progress Notes (Signed)
Family Medicine Teaching Service Daily Progress Note Intern Pager: [phone]  Patient name: [name] Medical record number: [id number] Date of birth: [birth year] Age: 60 y.o. Gender: male  Primary Care Provider: Tawanna Sat, MD Consultants: Ortho Code Status: Full  Pt Overview and Major Events to Date:  2/24: Fall - R BKA wound dehiscence - stable  Assessment and Plan: [name] is a 60 y.o. male presenting with BKA stump wound dehiscence after a fall. PMH is significant for HTN and poorly controlled DM.   Right BKA Stump wound dehiscence after fall: Per history not a syncopal event. Stump with ace bandage, no pain. - admit to teaching service, attending Dr. Ardelia Mems - Dr. Annye English - NPO for surgery - IV Morphine PRN pain - Maintenance IVF - Consult PT/OT   DM2: A1c 11.7 (12/2014). Home meds: Lantus 20, Novolog 5 units TID with meals. Elevated CBG on presentation to [facility], given Novolog. CBG 95 - NPO - D5 NS with K to @ 169ml/hr - Reduced Lantus to 10u qam while NPO - monitor CBG q 4 hours with sensitive SSI q 4 hrs  HTN: BP wnl while holding home Med: losartan-hctz 50-12.5mg  - will hold for now since NPO - will monitor closely and restart as needed  Anemia: Microcytic  - Check Iron panel  FEN/GI: NPO for surgery; D5NS with K @125ml /hr Prophylaxis: SCD on right leg (no pharmacologic prophylaxis as patient will likely have surgery in the AM, and is currently bleeding)  Disposition: Home pending PT/OT eval and surgery  Subjective:  Doing well; no questions / concerns. Denies pain.   Objective: Temp:  [98 F (36.7 C)-98.9 F (37.2 C)] 98.9 F (37.2 C) (02/25 0700) Pulse Rate:  [72-88] 74 (02/25 0700) Resp:  [18] 18 (02/25 0700) BP: (149-167)/(89-122) 149/89 mmHg (02/25 0700) SpO2:  [95 %-100 %] 100 % (02/25 0700) Weight:  [216 lb 4.8 oz (98.113 kg)] 216 lb 4.8 oz (98.113 kg) (02/25 0056) Physical Exam: General: NAD Eyes: L eye cataract noted; Right  eye reactive to light. EOMI CV: RRR, loud S2, no murmurs, rubs, or gallops PULM: CTAB, normal effort SKIN: No rash or cyanosis; warm and well-perfused EXTR: No lower extremity edema or calf tenderness; Right BKA: wrapped in ace bandage; no drainage or blood noted on bandage. Normal range of motion of right knee.  PSYCH: Mood and affect euthymic, normal rate and volume of speech NEURO: Awake, alert, no focal deficits grossly, normal speech  Laboratory:  Recent Labs Lab 04/29/15 1852 04/30/15 0639  WBC 7.9 8.6  HGB 11.9* 11.1*  HCT 37.3* 33.6*  PLT 329 304    Recent Labs Lab 04/29/15 1852 04/30/15 0639  NA 136 143  K 3.8 4.2  CL 100* 106  CO2 27 27  BUN 16 10  CREATININE 1.06 0.99  CALCIUM 9.0 8.8*  GLUCOSE 414* 206*     Recent Labs Lab 04/29/15 2138 04/30/15 0028 04/30/15 0207 04/30/15 0348 04/30/15 0744  GLUCAP 319* Crossett Joyner, MD 04/30/2015, 9:06 AM PGY-3, [place] Intern pager: [phone], text pages welcome

## 2015-04-30 NOTE — Progress Notes (Signed)
PT Cancellation Note  Patient Details Name: ALIF KLAPHAKE MRN: [id number] DOB: [birth year]   Cancelled Treatment:    Reason Eval/Treat Not Completed: Medical issues which prohibited therapy. Pt scheduled for surgery. Will check back post op.   SMITH,KAREN LUBECK 04/30/2015, 3:09 PM

## 2015-04-30 NOTE — Progress Notes (Signed)
Family medicine notified of patient arrival to room [phone].

## 2015-04-30 NOTE — ED Notes (Signed)
[facility] here to transfer pt to [facility]. 

## 2015-05-01 ENCOUNTER — Encounter (HOSPITAL_COMMUNITY)
Admission: EM | Disposition: A | Discharge status: Home / Self Care | Payer: Self-pay | Source: Home / Self Care | Attending: Family Medicine

## 2015-05-01 ENCOUNTER — Inpatient Hospital Stay (HOSPITAL_COMMUNITY): Payer: Medicaid Other | Admitting: Certified Registered Nurse Anesthetist

## 2015-05-01 HISTORY — PX: AMPUTATION: SHX166

## 2015-05-01 LAB — POCT I-STAT 4, (NA,K, GLUC, HGB,HCT)
Glucose, Bld: 154 mg/dL — ABNORMAL HIGH (ref 65–99)
HCT: 38 % — ABNORMAL LOW (ref 39.0–52.0)
Hemoglobin: 12.9 g/dL — ABNORMAL LOW (ref 13.0–17.0)
Potassium: 3.5 mmol/L (ref 3.5–5.1)
Sodium: 143 mmol/L (ref 135–145)

## 2015-05-01 LAB — SURGICAL PCR SCREEN
MRSA, PCR: NEGATIVE
Staphylococcus aureus: POSITIVE — AB

## 2015-05-01 LAB — GLUCOSE, CAPILLARY
Glucose-Capillary: 119 mg/dL — ABNORMAL HIGH (ref 65–99)
Glucose-Capillary: 138 mg/dL — ABNORMAL HIGH (ref 65–99)
Glucose-Capillary: 168 mg/dL — ABNORMAL HIGH (ref 65–99)
Glucose-Capillary: 185 mg/dL — ABNORMAL HIGH (ref 65–99)
Glucose-Capillary: 192 mg/dL — ABNORMAL HIGH (ref 65–99)
Glucose-Capillary: 198 mg/dL — ABNORMAL HIGH (ref 65–99)
Glucose-Capillary: 231 mg/dL — ABNORMAL HIGH (ref 65–99)

## 2015-05-01 SURGERY — AMPUTATION BELOW KNEE
Anesthesia: General | Site: Leg Lower | Laterality: Right

## 2015-05-01 MED ORDER — ONDANSETRON HCL 4 MG PO TABS: 4.0000 mg | ORAL_TABLET | Freq: Four times a day (QID) | ORAL | Status: DC | PRN

## 2015-05-01 MED ORDER — LIDOCAINE HCL (CARDIAC) 20 MG/ML IV SOLN
INTRAVENOUS | Status: DC | PRN
  Administered 2015-05-01: 100 mg via INTRATRACHEAL

## 2015-05-01 MED ORDER — METOCLOPRAMIDE HCL 10 MG PO TABS: 5.0000 mg | ORAL_TABLET | Freq: Three times a day (TID) | ORAL | Status: DC | PRN

## 2015-05-01 MED ORDER — ONDANSETRON HCL 4 MG/2ML IJ SOLN
INTRAMUSCULAR | Status: AC
  Filled 2015-05-01: qty 2

## 2015-05-01 MED ORDER — ONDANSETRON HCL 4 MG/2ML IJ SOLN: 4.0000 mg | Freq: Four times a day (QID) | INTRAMUSCULAR | Status: DC | PRN

## 2015-05-01 MED ORDER — HYDRALAZINE HCL 20 MG/ML IJ SOLN
INTRAMUSCULAR | Status: AC
  Filled 2015-05-01: qty 1

## 2015-05-01 MED ORDER — ROCURONIUM BROMIDE 50 MG/5ML IV SOLN
INTRAVENOUS | Status: AC
  Filled 2015-05-01: qty 1

## 2015-05-01 MED ORDER — SODIUM CHLORIDE 0.9 % IV SOLN
INTRAVENOUS | Status: DC | PRN
  Administered 2015-05-01 (×2): via INTRAVENOUS

## 2015-05-01 MED ORDER — MUPIROCIN 2 % EX OINT
1.0000 "application " | TOPICAL_OINTMENT | Freq: Two times a day (BID) | CUTANEOUS | Status: DC
  Administered 2015-05-01 – 2015-05-02 (×3): 1 via NASAL
  Filled 2015-05-01: qty 22

## 2015-05-01 MED ORDER — FENTANYL CITRATE (PF) 250 MCG/5ML IJ SOLN
INTRAMUSCULAR | Status: DC | PRN
  Administered 2015-05-01: 50 ug via INTRAVENOUS
  Administered 2015-05-01: 100 ug via INTRAVENOUS
  Administered 2015-05-01 (×2): 50 ug via INTRAVENOUS

## 2015-05-01 MED ORDER — ACETAMINOPHEN 650 MG RE SUPP: 650.0000 mg | Freq: Four times a day (QID) | RECTAL | Status: DC | PRN

## 2015-05-01 MED ORDER — FENTANYL CITRATE (PF) 250 MCG/5ML IJ SOLN
INTRAMUSCULAR | Status: AC
  Filled 2015-05-01: qty 5

## 2015-05-01 MED ORDER — ONDANSETRON HCL 4 MG/2ML IJ SOLN
INTRAMUSCULAR | Status: DC | PRN
  Administered 2015-05-01: 4 mg via INTRAVENOUS

## 2015-05-01 MED ORDER — CHLORHEXIDINE GLUCONATE CLOTH 2 % EX PADS
6.0000 | MEDICATED_PAD | Freq: Every day | CUTANEOUS | Status: DC
  Administered 2015-05-01 – 2015-05-02 (×2): 6 via TOPICAL

## 2015-05-01 MED ORDER — 0.9 % SODIUM CHLORIDE (POUR BTL) OPTIME
TOPICAL | Status: DC | PRN
  Administered 2015-05-01: 1000 mL

## 2015-05-01 MED ORDER — LIDOCAINE HCL (CARDIAC) 20 MG/ML IV SOLN
INTRAVENOUS | Status: AC
  Filled 2015-05-01: qty 5

## 2015-05-01 MED ORDER — SODIUM CHLORIDE 0.9 % IJ SOLN
INTRAMUSCULAR | Status: AC
  Filled 2015-05-01: qty 10

## 2015-05-01 MED ORDER — CEFAZOLIN SODIUM-DEXTROSE 2-3 GM-% IV SOLR
2.0000 g | Freq: Four times a day (QID) | INTRAVENOUS | Status: AC
  Administered 2015-05-01 – 2015-05-02 (×3): 2 g via INTRAVENOUS
  Filled 2015-05-01 (×3): qty 50

## 2015-05-01 MED ORDER — OXYCODONE HCL 5 MG PO TABS
5.0000 mg | ORAL_TABLET | ORAL | Status: DC | PRN
  Administered 2015-05-01: 5 mg via ORAL
  Administered 2015-05-01: 10 mg via ORAL
  Administered 2015-05-01 – 2015-05-02 (×2): 5 mg via ORAL
  Filled 2015-05-01 (×2): qty 2
  Filled 2015-05-01 (×2): qty 1

## 2015-05-01 MED ORDER — SODIUM CHLORIDE 0.9 % IV SOLN
INTRAVENOUS | Status: DC
  Administered 2015-05-01: 14:00:00 via INTRAVENOUS

## 2015-05-01 MED ORDER — METOCLOPRAMIDE HCL 5 MG/ML IJ SOLN: 5.0000 mg | Freq: Three times a day (TID) | INTRAMUSCULAR | Status: DC | PRN

## 2015-05-01 MED ORDER — PROPOFOL 10 MG/ML IV BOLUS
INTRAVENOUS | Status: DC | PRN
  Administered 2015-05-01: 170 mg via INTRAVENOUS

## 2015-05-01 MED ORDER — ACETAMINOPHEN 325 MG PO TABS: 650.0000 mg | ORAL_TABLET | Freq: Four times a day (QID) | ORAL | Status: DC | PRN

## 2015-05-01 MED ORDER — METHOCARBAMOL 1000 MG/10ML IJ SOLN
500.0000 mg | Freq: Four times a day (QID) | INTRAMUSCULAR | Status: DC | PRN
  Filled 2015-05-01: qty 5

## 2015-05-01 MED ORDER — METHOCARBAMOL 500 MG PO TABS
500.0000 mg | ORAL_TABLET | Freq: Four times a day (QID) | ORAL | Status: DC | PRN
  Administered 2015-05-01: 500 mg via ORAL
  Filled 2015-05-01: qty 1

## 2015-05-01 MED ORDER — PROPOFOL 10 MG/ML IV BOLUS
INTRAVENOUS | Status: AC
  Filled 2015-05-01: qty 20

## 2015-05-01 MED ORDER — HYDRALAZINE HCL 20 MG/ML IJ SOLN: 5.0000 mg | Freq: Four times a day (QID) | INTRAMUSCULAR | Status: DC | PRN

## 2015-05-01 MED ORDER — EPHEDRINE SULFATE 50 MG/ML IJ SOLN
INTRAMUSCULAR | Status: DC | PRN
  Administered 2015-05-01: 5 mg via INTRAVENOUS

## 2015-05-01 MED ORDER — CEFAZOLIN SODIUM-DEXTROSE 2-3 GM-% IV SOLR
INTRAVENOUS | Status: DC | PRN
  Administered 2015-05-01: 2 g via INTRAVENOUS

## 2015-05-01 MED ORDER — CEFAZOLIN SODIUM-DEXTROSE 2-3 GM-% IV SOLR
INTRAVENOUS | Status: AC
  Filled 2015-05-01: qty 50

## 2015-05-01 MED ORDER — HYDRALAZINE HCL 20 MG/ML IJ SOLN
INTRAMUSCULAR | Status: DC | PRN
  Administered 2015-05-01 (×3): 5 mg via INTRAVENOUS

## 2015-05-01 SURGICAL SUPPLY — 39 items
BLADE SAW RECIP 87.9 MT (BLADE) ×2 IMPLANT
BLADE SAW SGTL MED 73X18.5 STR (BLADE) ×1 IMPLANT
BLADE SURG 21 STRL SS (BLADE) ×2 IMPLANT
BNDG COHESIVE 6X5 TAN STRL LF (GAUZE/BANDAGES/DRESSINGS) ×2 IMPLANT
BNDG GAUZE ELAST 4 BULKY (GAUZE/BANDAGES/DRESSINGS) ×4 IMPLANT
CANISTER WOUND CARE 500ML ATS (WOUND CARE) ×1 IMPLANT
COVER SURGICAL LIGHT HANDLE (MISCELLANEOUS) ×4 IMPLANT
CUFF TOURNIQUET SINGLE 34IN LL (TOURNIQUET CUFF) IMPLANT
CUFF TOURNIQUET SINGLE 44IN (TOURNIQUET CUFF) IMPLANT
DRAPE EXTREMITY T 121X128X90 (DRAPE) ×2 IMPLANT
DRAPE INCISE IOBAN 66X45 STRL (DRAPES) IMPLANT
DRAPE PROXIMA HALF (DRAPES) ×2 IMPLANT
DRAPE U-SHAPE 47X51 STRL (DRAPES) ×2 IMPLANT
DRSG ADAPTIC 3X8 NADH LF (GAUZE/BANDAGES/DRESSINGS) ×1 IMPLANT
DRSG PAD ABDOMINAL 8X10 ST (GAUZE/BANDAGES/DRESSINGS) ×1 IMPLANT
DURAPREP 26ML APPLICATOR (WOUND CARE) ×2 IMPLANT
ELECT REM PT RETURN 9FT ADLT (ELECTROSURGICAL) ×2
ELECTRODE REM PT RTRN 9FT ADLT (ELECTROSURGICAL) ×1 IMPLANT
GAUZE SPONGE 4X4 12PLY STRL (GAUZE/BANDAGES/DRESSINGS) ×1 IMPLANT
GLOVE BIOGEL PI IND STRL 9 (GLOVE) ×1 IMPLANT
GLOVE BIOGEL PI INDICATOR 9 (GLOVE) ×1
GLOVE SURG ORTHO 9.0 STRL STRW (GLOVE) ×2 IMPLANT
GOWN STRL REUS W/ TWL XL LVL3 (GOWN DISPOSABLE) ×2 IMPLANT
GOWN STRL REUS W/TWL XL LVL3 (GOWN DISPOSABLE) ×4
KIT BASIN OR (CUSTOM PROCEDURE TRAY) ×2 IMPLANT
KIT PREVENA INCISION MGT20CM45 (CANNISTER) ×1 IMPLANT
KIT ROOM TURNOVER OR (KITS) ×2 IMPLANT
MANIFOLD NEPTUNE II (INSTRUMENTS) ×1 IMPLANT
NS IRRIG 1000ML POUR BTL (IV SOLUTION) ×2 IMPLANT
PACK GENERAL/GYN (CUSTOM PROCEDURE TRAY) ×2 IMPLANT
PAD ARMBOARD 7.5X6 YLW CONV (MISCELLANEOUS) ×2 IMPLANT
SPONGE LAP 18X18 X RAY DECT (DISPOSABLE) IMPLANT
STAPLER VISISTAT 35W (STAPLE) IMPLANT
STOCKINETTE IMPERVIOUS LG (DRAPES) ×2 IMPLANT
SUT ETHILON 2 0 PSLX (SUTURE) ×3 IMPLANT
SUT SILK 2 0 (SUTURE) ×2
SUT SILK 2-0 18XBRD TIE 12 (SUTURE) ×1 IMPLANT
SUT VIC AB 1 CTX 27 (SUTURE) IMPLANT
TOWEL OR 17X26 10 PK STRL BLUE (TOWEL DISPOSABLE) ×2 IMPLANT

## 2015-05-01 NOTE — Progress Notes (Signed)
PT order received.  Pt still not with room assignment secondary to surgery this am.  Will check on pt tomorrow at earliest to determine readiness for therapy. [name], [place]

## 2015-05-01 NOTE — Consult Note (Signed)
ORTHOPAEDIC CONSULTATION  REQUESTING PHYSICIAN: Leeanne Rio, MD  Chief Complaint: Patient fell at home sustaining a dehiscence of his right transtibial amputation.  HPI: [name] is a 59 y.o. male who presents with with a dehiscence of the transtibial amputation. Patient fell at home sustaining a dehiscence. Patient denies any other trauma. Patient is 2 weeks out from his revision transtibial amputation.  Past Medical History  Diagnosis Date  . Diabetes mellitus   . Hypertension    Past Surgical History  Procedure Laterality Date  . I&d extremity Right 09/24/2013    Procedure: IRRIGATION AND DEBRIDEMENT RIGHT FOOT ULCER;  Surgeon: Renette Butters, MD;  Location: [place];  Service: Orthopedics;  Laterality: Right;  . I&d extremity Right 09/26/2013    Procedure: Repeat IRRIGATION AND DEBRIDEMENT Right Foot Ulcer;  Surgeon: Rozanna Box, MD;  Location: [place];  Service: Orthopedics;  Laterality: Right;  . Amputation Right 09/26/2013    Procedure: AMPUTATION BELOW KNEE;  Surgeon: Rozanna Box, MD;  Location: [place];  Service: Orthopedics;  Laterality: Right;  . Tonsillectomy    . Colonoscopy    . Multiple tooth extractions    . Stump revision Right 04/20/2015    Procedure: Revision Right Below Knee Amputation, Apply Wound VAC;  Surgeon: Newt Minion, MD;  Location: [place];  Service: Orthopedics;  Laterality: Right;   Social History   Social History  . Marital Status: Married    Spouse Name: N/A  . Number of Children: N/A  . Years of Education: N/A   Social History Main Topics  . Smoking status: Former Smoker    Quit date: 05/10/2000  . Smokeless tobacco: Former Systems developer    Quit date: 02/22/2000  . Alcohol Use: No  . Drug Use: No  . Sexual Activity: Not Asked   Other Topics Concern  . None   Social History Narrative   ** Merged History Encounter **       Lives alone.    Family History  Problem Relation Age of Onset  . Cancer Mother   . Peripheral  vascular disease Father    - negative except otherwise stated in the family history section No Known Allergies Prior to Admission medications   Medication Sig Start Date End Date Taking? Authorizing Provider  brimonidine (ALPHAGAN) 0.2 % ophthalmic solution PLACE 1 DROP INTO THE LEFT EYE 2 (TWO) TIMES DAILY. 01/10/15  Yes Virginia Crews, MD  dorzolamide-timolol (COSOPT) 22.3-6.8 MG/ML ophthalmic solution Place 1 drop into the left eye 2 (two) times daily. 09/28/13  Yes Virginia Crews, MD  glucose blood (ACCU-CHEK AVIVA PLUS) test strip Check sugar 3 times daily 10/20/14  Yes Leone Brand, MD  HYDROcodone-acetaminophen [facility]) 5-325 MG tablet Take 1 tablet by mouth every 6 (six) hours as needed. Patient taking differently: Take 1 tablet by mouth every 6 (six) hours as needed for moderate pain.  04/20/15  Yes Newt Minion, MD  LANTUS 100 UNIT/ML injection INJECT 0.2 MLS (20 UNITS TOTAL) INTO THE SKIN AT BEDTIME. 11/10/14  Yes Leone Brand, MD  losartan-hydrochlorothiazide [facility]) 50-12.5 MG tablet Take 1 tablet by mouth daily. 01/03/15  Yes Leone Brand, MD  NOVOLOG 100 UNIT/ML injection INJECT 5 UNITS INTO THE SKIN 3 (THREE) TIMES DAILY BEFORE MEALS. DO NOT TAKE IF SUGAR IS <150 09/07/14  Yes Leone Brand, MD  Tetrahydrozoline HCl (VISINE OP) Place 1 drop into the left eye 2 (two) times daily. Only uses in  left eye.   Yes Historical Provider, MD  vitamin C (ASCORBIC ACID) 500 MG tablet Take 500 mg by mouth daily.   Yes Historical Provider, MD   No results found. - pertinent xrays, CT, MRI studies were reviewed and independently interpreted  Positive ROS: All other systems have been reviewed and were otherwise negative with the exception of those mentioned in the HPI and as above.  Physical Exam: General: Alert, no acute distress Cardiovascular: No pedal edema Respiratory: No cyanosis, no use of accessory musculature GI: No organomegaly, abdomen is soft and non-tender Skin:  Dehiscence right transtibial amputation Neurologic: Patient does not have protective sensation Psychiatric: Patient is competent for consent with normal mood and affect Lymphatic: No axillary or cervical lymphadenopathy  MUSCULOSKELETAL:  On examination there is dehiscence of the transtibial amputation there is exposed bone there is no cellulitis no purulence no signs of infection.  Assessment: Assessment: Dehiscence right transtibial amputation  Plan: We'll plan for a revision of the transtibial amputation this morning. Risk and benefits were discussed including risk of the wound not healing. Patient states he understands wishes to proceed at this time.  Thank you for the consult and the opportunity to see [name], [name] [phone] 6:50 AM

## 2015-05-01 NOTE — Progress Notes (Signed)
Family Medicine Teaching Service Daily Progress Note Intern Pager: [phone]  Patient name: [name] Medical record number: [id number] Date of birth: 08/05/1955 Age: 60 y.o. Gender: male  Primary Care Provider: Tawanna Sat, MD Consultants: Ortho Code Status: Full  Pt Overview and Major Events to Date:  2/24: Fall - R BKA stump dehiscence - stable  Assessment and Plan: ELCHANAN DALESANDRO is a 60 y.o. male presenting with BKA stump dehiscence after a fall. PMH is significant for HTN and poorly controlled DM.   Right BKA Stump dehiscence after fall: Per history not a syncopal event. Stump with dry ace bandage, no pain. - Dr. Sharol Given consulted, appreciate reccs: NPO at midnight, likely OR 2/26 - IV Morphine PRN pain - Maintenance IVF - Consult PT/OT   DM2: A1c 11.7 (12/2014). CBGs 168-204, AM 168.  Home meds: Lantus 20, Novolog 5 units TID with meals.  - NPO - D5 NS @ 166ml/hr - Reduced Lantus 10u qam while NPO - monitor CBG q 4 hours with sensitive SSI q 4 hrs  HTN: BP mildly elevated while holding home Med: losartan-hctz 50-12.5mg  - will hold for now since NPO - will monitor closely and restart as needed - Hydralazine PRN   Microcytic Anemia: Iron Deficiency Anemia Low Fe at 25, TIBC wnl, 8% saturation ratio; Ferritin 90 - Likely start supplement post surgery   FEN/GI: NPO for surgery; D5NS  @125ml /hr Prophylaxis: SCD on right leg (no pharmacologic prophylaxis as patient will likely have surgery in the AM, and is currently bleeding)  Disposition: Home pending PT/OT eval and surgery.   Subjective:  Doing well; no questions / concerns. Denies pain.   Objective: Temp:  [97.8 F (36.6 C)-98.3 F (36.8 C)] 97.8 F (36.6 C) (02/26 0841) Pulse Rate:  [73-80] 78 (02/26 0842) Resp:  [16-20] 18 (02/26 0841) BP: (138-172)/(62-114) 166/114 mmHg (02/26 0842) SpO2:  [97 %-98 %] 97 % (02/26 0841) Physical Exam: General: NAD CV: RRR, loud S2, no murmurs, rubs, or gallops PULM:  CTAB, normal effort SKIN: No rash or cyanosis; warm and well-perfused EXTR: No lower extremity edema or calf tenderness; Right BKA: wrapped in ace bandage; ormal range of motion of right knee.   Laboratory:  Recent Labs Lab 04/29/15 1852 04/30/15 0639  WBC 7.9 8.6  HGB 11.9* 11.1*  HCT 37.3* 33.6*  PLT 329 304    Recent Labs Lab 04/29/15 1852 04/30/15 0639  NA 136 143  K 3.8 4.2  CL 100* 106  CO2 27 27  BUN 16 10  CREATININE 1.06 0.99  CALCIUM 9.0 8.8*  GLUCOSE 414* 206*     Recent Labs Lab 04/30/15 1542 04/30/15 2013 04/30/15 2358 05/01/15 0414 05/01/15 0816  GLUCAP 204* 125* 231* 185* 168*   Smiley Houseman, MD 05/01/2015, 9:36 AM PGY-1, [place] Intern pager: [phone], text pages welcome

## 2015-05-01 NOTE — Anesthesia Procedure Notes (Signed)
Procedure Name: LMA Insertion Date/Time: 05/01/2015 10:31 AM Performed by: Marinda Elk A Pre-anesthesia Checklist: Patient identified, Timeout performed, Emergency Drugs available, Suction available and Patient being monitored Patient Re-evaluated:Patient Re-evaluated prior to inductionOxygen Delivery Method: Circle system utilized Preoxygenation: Pre-oxygenation with 100% oxygen Intubation Type: IV induction LMA: LMA inserted LMA Size: 5.0 Number of attempts: 1 Placement Confirmation: positive ETCO2 and breath sounds checked- equal and bilateral Tube secured with: Tape Dental Injury: Teeth and Oropharynx as per pre-operative assessment

## 2015-05-01 NOTE — Op Note (Signed)
04/29/2015 - 05/01/2015  11:06 AM  PATIENT:  [name]    PRE-OPERATIVE DIAGNOSIS:  Dehiscence right transtibial amputation  POST-OPERATIVE DIAGNOSIS:  Same  PROCEDURE:  REVISION OF RIGHT TRANSTIBIAL AMPUTATION  Application wound VAC  SURGEON:  DUDA,MARCUS V, MD  PHYSICIAN ASSISTANT:None ANESTHESIA:   General  PREOPERATIVE INDICATIONS:  [name] is a  60 y.o. male with a diagnosis of open wound to right stump who failed conservative measures and elected for surgical management.    The risks benefits and alternatives were discussed with the patient preoperatively including but not limited to the risks of infection, bleeding, nerve injury, cardiopulmonary complications, the need for revision surgery, among others, and the patient was willing to proceed.  OPERATIVE IMPLANTS: Wound VAC  OPERATIVE FINDINGS: Good bleeding healthy tissue at resection margins  OPERATIVE PROCEDURE: Patient brought to the operating room and underwent a general anesthetic. After adequate levels anesthesia obtained patient's right lower extremity was prepped using DuraPrep draped into a sterile field. A timeout was called. Elliptical incision was made approximately 2 cm proximal to the traumatic dehiscence. This was carried sharply down to bone. The distal 2 cm of tibia and fibula were resected and beveled anteriorly. Electrocautery was used for hemostasis. There is good bleeding there was no ischemic muscle at the resection margins. The wound was closed with deep and superficial fascial layers and skin closure with 2-0 nylon. A wound VAC was applied. Patient was extubated taken to PACU in stable condition.  Anticipate patient will need discharge to skilled nursing or inpatient rehabilitation.

## 2015-05-01 NOTE — Discharge Summary (Signed)
[facility] Discharge Summary  Patient name: [name] Medical record number: [id number] Date of birth: 07-May-1955 Age: 60 y.o. Gender: male Date of Admission: 04/29/2015  Date of Discharge: 05/02/15 Admitting Physician: Leeanne Rio, MD  Primary Care Provider: Tawanna Sat, MD Consultants: Orthopedics  Indication for Hospitalization: R BKA stump dehiscence after a mechanical fall  Discharge Diagnoses/Problem List:  R BKA stump dehiscence DM2 HTN Microcytic Anemia  Disposition: home  Discharge Condition: stable s/p repair for stump dehiscence   Discharge Exam:  Please refer to progress note from day of discharge  Brief Hospital Course:   Right BKA Stump Dehiscence after fall, s/p Revision 05/01/15:  Patient presented with Right BKA stump wound dehiscence after a fall. Patient had a stump revision performed by Dr. Sharol Given about 10 days prior to presentation. Patient states that he was trying to get something from his mailbox, he tried to get up from his wheelchair and he slipped and fell on his stump; "some of the stiches came out". He did not hit his head. Patient had revision by Dr. Sharol Given and a wound vac was place. Patient is to make a follow up appointment with Dr. Sharol Given in 1 week after discharge. PT/OT evaluated patient, and patient did not require additional therapy or equipment.   DM2: Patient's home medications included Lantus 20 units, Novolog 5 units with meals.  Patient was started on Lantus 10 units while in the hospital and Sensitive SSI. His CBGs were stable with this regimen, therefore he was discharged on Lantus 10 units in the morning, and he was asked to resume his usual Novolog dose.   HTN: Pateint's blood pressure was stable without his home Losartan-HCTZ. This medication was held at discharge due to his stable BP and his mildly elevated Creatinine.  Microcytic Anemia:   Patient noted to have the following iron studies: Low Fe  at 25, TIBC wnl, 8% saturation ratio; Ferritin 90. Per chart review, patient has not had colonoscopy.   Issues for Follow Up:  - ensure patient has made a follow up appointment with Dr. Sharol Given  - consider repeat A1c as last one was done on 12/2014 - follow up BP; consider restarting home med - consider BMP to follow up Creatinine - consider starting iron supplement  - consider referral to GI for screening colonoscopy  Significant Procedures: Stump Revision by Dr. Sharol Given  Significant Labs and Imaging:   Recent Labs Lab 04/29/15 1852 04/30/15 0639 05/01/15 1007  WBC 7.9 8.6  --   HGB 11.9* 11.1* 12.9*  HCT 37.3* 33.6* 38.0*  PLT 329 304  --     Recent Labs Lab 04/29/15 1852 04/30/15 0639 05/01/15 1007 05/02/15 1014  NA 136 143 143 142  K 3.8 4.2 3.5 4.0  CL 100* 106  --  107  CO2 27 27  --  27  GLUCOSE 414* 206* 154* 164*  BUN 16 10  --  15  CREATININE 1.06 0.99  --  1.26*  CALCIUM 9.0 8.8*  --  8.6*    Results/Tests Pending at Time of Discharge: none  Discharge Medications:    Medication List    STOP taking these medications        losartan-hydrochlorothiazide 50-12.5 MG tablet  Commonly known as:  HYZAAR      TAKE these medications        brimonidine 0.2 % ophthalmic solution  Commonly known as:  ALPHAGAN  PLACE 1 DROP INTO THE LEFT EYE 2 (TWO)  TIMES DAILY.     dorzolamide-timolol 22.3-6.8 MG/ML ophthalmic solution  Commonly known as:  COSOPT  Place 1 drop into the left eye 2 (two) times daily.     glucose blood test strip  Commonly known as:  ACCU-CHEK AVIVA PLUS  Check sugar 3 times daily     HYDROcodone-acetaminophen 5-325 MG tablet  Commonly known as:  NORCO  Take 1 tablet by mouth every 6 (six) hours as needed.     insulin glargine 100 UNIT/ML injection  Commonly known as:  LANTUS  Inject 0.1 mLs (10 Units total) into the skin every morning.  Start taking on:  05/03/2015     NOVOLOG 100 UNIT/ML injection  Generic drug:  insulin aspart   INJECT 5 UNITS INTO THE SKIN 3 (THREE) TIMES DAILY BEFORE MEALS. DO NOT TAKE IF SUGAR IS <150     VISINE OP  Place 1 drop into the left eye 2 (two) times daily. Only uses in left eye.     vitamin C 500 MG tablet  Commonly known as:  ASCORBIC ACID  Take 500 mg by mouth daily.        Discharge Instructions: Please refer to Patient Instructions section of EMR for full details.  Patient was counseled important signs and symptoms that should prompt return to medical care, changes in medications, dietary instructions, activity restrictions, and follow up appointments.   Follow-Up Appointments: Follow-up Information    Follow up with DUDA,MARCUS V, MD In 1 week.   Specialty:  Orthopedic Surgery   Contact information:   [place] [address] [phone]       Follow up with Lavon Paganini, MD On 05/06/2015.   Specialty:  Family Medicine   Why:  at 8:30AM for hospital follow up visit    Contact information:   [place] [address] [phone]       Smiley Houseman, MD 05/02/2015, 4:52 PM PGY-1, [place]

## 2015-05-01 NOTE — Anesthesia Preprocedure Evaluation (Addendum)
Anesthesia Evaluation  Patient identified by MRN, date of birth, ID band Patient awake    Reviewed: Allergy & Precautions, NPO status , Patient's Chart, lab work & pertinent test results  History of Anesthesia Complications Negative for: history of anesthetic complications  Airway Mallampati: III  TM Distance: >3 FB Neck ROM: Full    Dental  (+) Edentulous Upper, Teeth Intact, Dental Advisory Given   Pulmonary neg shortness of breath, neg sleep apnea, neg COPD, neg recent URI, former smoker,    breath sounds clear to auscultation       Cardiovascular hypertension, Pt. on medications (-) angina+ Peripheral Vascular Disease  (-) CAD, (-) Past MI and (-) CHF  Rhythm:Regular Rate:Normal  Was complaining of chest pain but this went away. No SOB. EKG was unchanged from before   Neuro/Psych PSYCHIATRIC DISORDERS Schizophrenia negative neurological ROS     GI/Hepatic negative GI ROS, Neg liver ROS,   Endo/Other  diabetes, Type 2, Insulin Dependent  Renal/GU negative Renal ROS     Musculoskeletal negative musculoskeletal ROS (+)   Abdominal   Peds  Hematology negative hematology ROS (+)   Anesthesia Other Findings   Reproductive/Obstetrics                           Anesthesia Physical Anesthesia Plan  ASA: III  Anesthesia Plan: General   Post-op Pain Management:    Induction:   Airway Management Planned: LMA  Additional Equipment:   Intra-op Plan:   Post-operative Plan: Extubation in OR  Informed Consent: I have reviewed the patients History and Physical, chart, labs and discussed the procedure including the risks, benefits and alternatives for the proposed anesthesia with the patient or authorized representative who has indicated his/her understanding and acceptance.   Dental advisory given  Plan Discussed with: CRNA and Surgeon  Anesthesia Plan Comments: (Plan treat HTN c  hydralazine)       Anesthesia Quick Evaluation

## 2015-05-01 NOTE — Anesthesia Postprocedure Evaluation (Signed)
Anesthesia Post Note  Patient: [name]  Procedure(s) Performed: Procedure(s) (LRB): REVISION OF RIGHT TRANSTIBIAL AMPUTATION  (Right)  Patient location during evaluation: PACU Anesthesia Type: General Level of consciousness: awake and alert Pain management: pain level controlled Vital Signs Assessment: post-procedure vital signs reviewed and stable Respiratory status: spontaneous breathing, nonlabored ventilation, respiratory function stable and patient connected to nasal cannula oxygen Cardiovascular status: blood pressure returned to baseline and stable Postop Assessment: no signs of nausea or vomiting Anesthetic complications: no    Last Vitals:  Filed Vitals:   05/01/15 1230 05/01/15 1245  BP: 151/98 147/100  Pulse: 71 73  Temp:    Resp: 15 20    Last Pain:  Filed Vitals:   05/01/15 1251  PainSc: 5                  MASSAGEE,JAMES TERRILL

## 2015-05-01 NOTE — Transfer of Care (Signed)
Immediate Anesthesia Transfer of Care Note  Patient: [name]  Procedure(s) Performed: Procedure(s): REVISION OF RIGHT TRANSTIBIAL AMPUTATION  (Right)  Patient Location: PACU  Anesthesia Type:General  Level of Consciousness: awake  Airway & Oxygen Therapy: Patient Spontanous Breathing and Patient connected to nasal cannula oxygen  Post-op Assessment: Report given to RN and Post -op Vital signs reviewed and stable  Post vital signs: Reviewed and stable  Last Vitals:  Filed Vitals:   05/01/15 0841 05/01/15 0842  BP: 172/103 166/114  Pulse: 80 78  Temp: 36.6 C   Resp: 18     Complications: No apparent anesthesia complications

## 2015-05-01 NOTE — Progress Notes (Signed)
UR COMPLETED  

## 2015-05-02 ENCOUNTER — Encounter (HOSPITAL_COMMUNITY): Payer: Self-pay | Admitting: Orthopedic Surgery

## 2015-05-02 DIAGNOSIS — T879 Unspecified complications of amputation stump: Secondary | ICD-10-CM

## 2015-05-02 DIAGNOSIS — Z91148 Patient's other noncompliance with medication regimen for other reason: Secondary | ICD-10-CM | POA: Insufficient documentation

## 2015-05-02 DIAGNOSIS — D62 Acute posthemorrhagic anemia: Secondary | ICD-10-CM | POA: Insufficient documentation

## 2015-05-02 DIAGNOSIS — I739 Peripheral vascular disease, unspecified: Secondary | ICD-10-CM

## 2015-05-02 DIAGNOSIS — E1142 Type 2 diabetes mellitus with diabetic polyneuropathy: Secondary | ICD-10-CM

## 2015-05-02 DIAGNOSIS — G8918 Other acute postprocedural pain: Secondary | ICD-10-CM | POA: Insufficient documentation

## 2015-05-02 DIAGNOSIS — Z9114 Patient's other noncompliance with medication regimen: Secondary | ICD-10-CM | POA: Insufficient documentation

## 2015-05-02 DIAGNOSIS — N179 Acute kidney failure, unspecified: Secondary | ICD-10-CM | POA: Insufficient documentation

## 2015-05-02 DIAGNOSIS — I10 Essential (primary) hypertension: Secondary | ICD-10-CM

## 2015-05-02 LAB — GLUCOSE, CAPILLARY
Glucose-Capillary: 158 mg/dL — ABNORMAL HIGH (ref 65–99)
Glucose-Capillary: 159 mg/dL — ABNORMAL HIGH (ref 65–99)
Glucose-Capillary: 164 mg/dL — ABNORMAL HIGH (ref 65–99)
Glucose-Capillary: 177 mg/dL — ABNORMAL HIGH (ref 65–99)
Glucose-Capillary: 98 mg/dL (ref 65–99)

## 2015-05-02 LAB — BASIC METABOLIC PANEL
Anion gap: 8 (ref 5–15)
BUN: 15 mg/dL (ref 6–20)
CO2: 27 mmol/L (ref 22–32)
Calcium: 8.6 mg/dL — ABNORMAL LOW (ref 8.9–10.3)
Chloride: 107 mmol/L (ref 101–111)
Creatinine, Ser: 1.26 mg/dL — ABNORMAL HIGH (ref 0.61–1.24)
GFR calc Af Amer: >60 mL/min (ref >60)
GFR calc non Af Amer: >60 mL/min (ref >60)
Glucose, Bld: 164 mg/dL — ABNORMAL HIGH (ref 65–99)
Potassium: 4 mmol/L (ref 3.5–5.1)
Sodium: 142 mmol/L (ref 135–145)

## 2015-05-02 MED ORDER — INSULIN ASPART 100 UNIT/ML ~~LOC~~ SOLN
0.0000 [IU] | Freq: Three times a day (TID) | SUBCUTANEOUS | Status: DC
  Administered 2015-05-02: 2 [IU] via SUBCUTANEOUS

## 2015-05-02 MED ORDER — INSULIN GLARGINE 100 UNIT/ML ~~LOC~~ SOLN: 10.0000 [IU] | Freq: Every morning | SUBCUTANEOUS | Status: DC

## 2015-05-02 MED ORDER — INSULIN ASPART 100 UNIT/ML ~~LOC~~ SOLN: 0.0000 [IU] | Freq: Every day | SUBCUTANEOUS | Status: DC

## 2015-05-02 NOTE — Evaluation (Signed)
Physical Therapy Evaluation Patient Details Name: [name] MRN: [id number] DOB: 1955/03/11 Today's Date: 05/02/2015   History of Present Illness  Pt adm with dehiscence of the transtibial amputation after fall at home. Pt had revision on 2/26. Pt had earlier revision on 2/15. PMH - rt BKA 7/15, DM, HTN, schizophremia  Clinical Impression  Pt admitted with above diagnosis and presents to PT with functional limitations due to deficits listed below (See PT problem list). Pt needs skilled PT to maximize independence and safety to allow discharge to home. Pt should be at or very close to baseline at DC. Pt has been transferring/amb without prosthesis at times for the past 4-5 months. Recent fall due to pt attempting to stand and maneuver trash cans from w/c on uneven surface. Will follow acutely.     Follow Up Recommendations Supervision - Intermittent    Equipment Recommendations  None recommended by PT    Recommendations for Other Services       Precautions / Restrictions Precautions Precautions: Fall      Mobility  Bed Mobility Overal bed mobility: Modified Independent                Transfers Overall transfer level: Needs assistance Equipment used: Rolling walker (2 wheeled);None Transfers: Sit to/from [employer] to Stand: Supervision Stand pivot transfers: Supervision       General transfer comment: Assist for safety and balance and lines  Ambulation/Gait Ambulation/Gait assistance: Min guard Ambulation Distance (Feet): 5 Feet (forward and backwards) Assistive device: Rolling walker (2 wheeled) Gait Pattern/deviations: Step-to pattern Gait velocity: decr Gait velocity interpretation: Below normal speed for age/gender General Gait Details: Hop to gait with walker  Stairs            Wheelchair Mobility    Modified Rankin (Stroke Patients Only)       Balance Overall balance assessment: Needs assistance Sitting-balance  support: No upper extremity supported;Feet supported Sitting balance-Leahy Scale: Good     Standing balance support: Bilateral upper extremity supported Standing balance-Leahy Scale: Poor Standing balance comment: support of walker due to bka                             Pertinent Vitals/Pain Pain Assessment: Faces Faces Pain Scale: Hurts little more Pain Location: rt residual limb Pain Descriptors / Indicators: Throbbing;Tender Pain Intervention(s): Limited activity within patient's tolerance;Monitored during session;Repositioned    Home Living Family/patient expects to be discharged to:: Private residence Living Arrangements: Alone Available Help at Discharge: Family;Available PRN/intermittently Type of Home: Apartment Home Access: Level entry     Home Layout: One level Home Equipment: Walker - 2 wheels;Bedside commode;Wheelchair - manual      Prior Function Level of Independence: Independent with assistive device(s)         Comments: Pt has been modified independent at w/c and walker level. Had only been using prosthetic intermittently for 4-5 months prior to surgery in mid February. Used walker without prosthesis. Only amb short distances with walker and no prosthetic.      Hand Dominance   Dominant Hand: Right    Extremity/Trunk Assessment   Upper Extremity Assessment: Defer to OT evaluation           Lower Extremity Assessment: Overall WFL for tasks assessed         Communication   Communication: No difficulties  Cognition Arousal/Alertness: Awake/alert Behavior During Therapy: WFL for tasks assessed/performed Overall Cognitive Status: Within  Functional Limits for tasks assessed                      General Comments      Exercises        Assessment/Plan    PT Assessment Patient needs continued PT services  PT Diagnosis Difficulty walking   PT Problem List Decreased mobility  PT Treatment Interventions DME  instruction;Gait training;Functional mobility training;Patient/family education   PT Goals (Current goals can be found in the Care Plan section) Acute Rehab PT Goals Patient Stated Goal: Return home PT Goal Formulation: With patient Time For Goal Achievement: 05/09/15 Potential to Achieve Goals: Good    Frequency Min 4X/week   Barriers to discharge        Co-evaluation               End of Session   Activity Tolerance: Patient tolerated treatment well Patient left: in chair;with call bell/phone within reach;with chair alarm set           Time: 1208-1230 PT Time Calculation (min) (ACUTE ONLY): 22 min   Charges:   PT Evaluation $PT Eval Moderate Complexity: 1 Procedure     PT G Codes:        MAYCOCK,CARY 05/12/2015, 2:00 PM [facility] PT [phone]

## 2015-05-02 NOTE — Progress Notes (Signed)
Patient ID: [name], male   DOB: 1955-07-11, 60 y.o.   MRN: [id number] Postoperative day 1 right transtibial amputation revision. Patient disrupted the largest VAC suction canister he was changed to the smaller portable wound VAC however the wound VAC was not turned on. The wound VAC was turned on this morning and patient had a good suction fit. We will request evaluation for possible inpatient versus outpatient rehabilitation.

## 2015-05-02 NOTE — Consult Note (Signed)
Physical Medicine and Rehabilitation Consult   Reason for Consult: Revision of R-BKA in patient with DMT2 with insensate neuropathy Referring Physician: Dr. Sharol Given    HPI: [name] is a 60 y.o. 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 Patient was wearing  prosthesis intermittently till 3 weeks ago and mobility limited to stand pivot transfers recently.   He was admitted on 04/30/15 after mechanical fall at home with stump dehiscence. He denied dizziness, did have LOC and BS 414 at admission.  He was taken to with  OR for revision of wound with placement of VAC by Dr. Sharol Given. Therapy evaluations pending and patient reports that he has not been out of bed yet. Pt states his VAC is not draining and there are plans to potentially d/c the wound vac soon. CIR recommended by MD for follow up therapy.   Review of Systems  HENT: Positive for hearing loss.   Eyes: Negative for blurred vision and double vision.       Blind left eye  Respiratory: Positive for shortness of breath (at times).   Cardiovascular: Positive for chest pain (due to reflux?). Negative for palpitations.  Gastrointestinal: Positive for constipation. Negative for heartburn, nausea and abdominal pain.  Genitourinary: Negative for dysuria and urgency.  Musculoskeletal: Positive for myalgias. Negative for back pain.  Skin: Negative for rash.  Neurological: Positive for headaches.  Psychiatric/Behavioral: Negative for depression. The patient is not nervous/anxious and does not have insomnia.   All other systems reviewed and are negative.   Past Medical History  Diagnosis Date  . Diabetes mellitus   . Hypertension     Past Surgical History  Procedure Laterality Date  . I&d extremity Right 09/24/2013    Procedure: IRRIGATION AND  DEBRIDEMENT RIGHT FOOT ULCER;  Surgeon: Renette Butters, MD;  Location: [place];  Service: Orthopedics;  Laterality: Right;  . I&d extremity Right 09/26/2013    Procedure: Repeat IRRIGATION AND DEBRIDEMENT Right Foot Ulcer;  Surgeon: Rozanna Box, MD;  Location: [place];  Service: Orthopedics;  Laterality: Right;  . Amputation Right 09/26/2013    Procedure: AMPUTATION BELOW KNEE;  Surgeon: Rozanna Box, MD;  Location: [place];  Service: Orthopedics;  Laterality: Right;  . Tonsillectomy    . Colonoscopy    . Multiple tooth extractions    . Stump revision Right 04/20/2015    Procedure: Revision Right Below Knee Amputation, Apply Wound VAC;  Surgeon: Newt Minion, MD;  Location: [place];  Service: Orthopedics;  Laterality: Right;    Family History  Problem Relation Age of Onset  . Cancer Mother   . Peripheral vascular disease Father     Social History:  Lives alone and independent PTA. Disabled since 2005. He reports that he quit smoking about 10 years ago. He quit smokeless tobacco use about 15 years ago. He reports that he does not drink alcohol or use illicit drugs.    Allergies: No Known Allergies    Medications Prior to Admission  Medication Sig Dispense Refill  . brimonidine (ALPHAGAN) 0.2 % ophthalmic solution PLACE 1 DROP INTO THE LEFT EYE 2 (TWO) TIMES DAILY. 5 mL 0  . dorzolamide-timolol (COSOPT) 22.3-6.8 MG/ML ophthalmic solution Place 1 drop into the left eye 2 (two) times daily. 10 mL 12  . glucose blood (ACCU-CHEK AVIVA PLUS) test strip  Check sugar 3 times daily 100 each 3  . HYDROcodone-acetaminophen (NORCO) 5-325 MG tablet Take 1 tablet by mouth every 6 (six) hours as needed. (Patient taking differently: Take 1 tablet by mouth every 6 (six) hours as needed for moderate pain. ) 30 tablet 0  . LANTUS 100 UNIT/ML injection INJECT 0.2 MLS (20 UNITS TOTAL) INTO THE SKIN AT BEDTIME. 10 mL 3  . losartan-hydrochlorothiazide (HYZAAR) 50-12.5 MG tablet Take 1 tablet by mouth daily. 30  tablet 0  . NOVOLOG 100 UNIT/ML injection INJECT 5 UNITS INTO THE SKIN 3 (THREE) TIMES DAILY BEFORE MEALS. DO NOT TAKE IF SUGAR IS <150 30 mL 2  . Tetrahydrozoline HCl (VISINE OP) Place 1 drop into the left eye 2 (two) times daily. Only uses in left eye.    . vitamin C (ASCORBIC ACID) 500 MG tablet Take 500 mg by mouth daily.      Home: Home Living Family/patient expects to be discharged to:: Private residence Living Arrangements: Alone  Functional History:   Functional Status:  Mobility:          ADL:    Cognition: Cognition Orientation Level: Oriented X4    Blood pressure 134/85, pulse 75, temperature 98.2 F (36.8 C), temperature source Oral, resp. rate 18, weight 98.113 kg (216 lb 4.8 oz), SpO2 95 %. Physical Exam  Vitals reviewed. Constitutional: He appears well-developed and well-nourished.  HENT:  Head: Normocephalic and atraumatic.  Eyes: Conjunctivae are normal. Right eye exhibits no discharge. Left eye exhibits no discharge. No scleral icterus.  Neck: Normal range of motion. Neck supple.  Cardiovascular: Normal rate and regular rhythm.   Distal LLE pulses difficult to palpate  Respiratory: Effort normal and breath sounds normal.  GI: Soft. Bowel sounds are normal.  Musculoskeletal: He exhibits tenderness. He exhibits no edema.  Neurological: He is alert. He exhibits normal muscle tone.  Motor: b/l UE 5/5 proximal to distal LLE 5/5 proximal to distal  Skin: Skin is warm and dry.  BKA with dressing c/d/i  Psychiatric: He has a normal mood and affect. His behavior is normal.    Results for orders placed or performed during the hospital encounter of 04/29/15 (from the past 24 hour(s))  I-STAT 4, (NA,K, GLUC, HGB,HCT)     Status: Abnormal   Collection Time: 05/01/15 10:07 AM  Result Value Ref Range   Sodium 143 135 - 145 mmol/L   Potassium 3.5 3.5 - 5.1 mmol/L   Glucose, Bld 154 (H) 65 - 99 mg/dL   HCT 38.0 (L) 39.0 - 52.0 %   Hemoglobin 12.9 (L) 13.0 -  17.0 g/dL  Glucose, capillary     Status: Abnormal   Collection Time: 05/01/15 11:35 AM  Result Value Ref Range   Glucose-Capillary 119 (H) 65 - 99 mg/dL  Glucose, capillary     Status: Abnormal   Collection Time: 05/01/15  1:33 PM  Result Value Ref Range   Glucose-Capillary 138 (H) 65 - 99 mg/dL  Glucose, capillary     Status: Abnormal   Collection Time: 05/01/15  5:10 PM  Result Value Ref Range   Glucose-Capillary 198 (H) 65 - 99 mg/dL  Glucose, capillary     Status: Abnormal   Collection Time: 05/01/15  8:29 PM  Result Value Ref Range   Glucose-Capillary 192 (H) 65 - 99 mg/dL   Comment 1 Notify RN    Comment 2 Document in Chart   Glucose, capillary     Status: Abnormal   Collection Time: 05/02/15 12:14  AM  Result Value Ref Range   Glucose-Capillary 177 (H) 65 - 99 mg/dL   Comment 1 Notify RN    Comment 2 Document in Chart   Glucose, capillary     Status: Abnormal   Collection Time: 05/02/15  4:23 AM  Result Value Ref Range   Glucose-Capillary 164 (H) 65 - 99 mg/dL   Comment 1 Notify RN    Comment 2 Document in Chart   Glucose, capillary     Status: None   Collection Time: 05/02/15  7:55 AM  Result Value Ref Range   Glucose-Capillary 98 65 - 99 mg/dL   No results found.  Assessment/Plan: Diagnosis: Revision of R-BKA in patient with DMT2 with insensate neuropathy Labs and images independently reviewed.  Records reviewed and summated above. PT/OT for mobility, ADL's, strengthening,and wheelchair training Clean amputation daily with soap and water Monitor incision site for signs of infection or impending skin breakdown. Staples to remain in place for 3-4 weeks Stump shrinker, for edema control  Scar mobilization massaging to prevent soft tissue adherence Stump protector during therapies Prevent flexion contractures by implementing the following:   Encourage prone lying for 20-30 mins per day BID to avoid hip flexion  Contractures if medically appropriate;  Avoid  pillow under knees when patient is lying in bed in order to prevent both  knee and hip flexion contractures;  Avoid prolonged sitting Post surgical pain control with oral medication Phantom limb pain control with physical modalities including desensitization techniques (gentle self massage to the residual stump,hot packs if sensation iintact, [place]) and mirror therapy, TENS. If ineffective, consider pharmacological treatment for neuropathic pain (e.g gabapentin, pregabalin, amytriptalyine, duloxetine).  When using wheelchair, patient should have knee on amputated side fully extended with board under the seat cushion. Avoid injury to contralateral side  1. Does the need for close, 24 hr/day medical supervision in concert with the patient's rehab needs make it unreasonable for this patient to be served in a less intensive setting? Potentially  2. 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 Due to safety, skin/wound care, disease management, pain management and patient education, does the patient require 24 hr/day rehab nursing? Potentially 4. Does the patient require coordinated care of a physician, rehab nurse, PT (1-2 hrs/day, 5 days/week) and OT (1-2 hrs/day, 5 days/week) to address physical and functional deficits in the context of the above medical diagnosis(es)? Yes Addressing deficits in the following areas: balance, endurance, locomotion, strength, transferring, bathing, dressing, toileting and psychosocial support 5. Can the patient actively participate in an intensive therapy program of at least 3 hrs of therapy per day at least 5 days  per week? Yes 6. The potential for patient to make measurable gains while on inpatient rehab is good 7. Anticipated functional outcomes upon discharge from inpatient rehab are TBD  with PT, TBD with OT, n/a with SLP. 8. Estimated rehab length of stay to reach the above functional goals is: TBD. 9. Does the patient have adequate social supports and living environment to accommodate these discharge functional goals? Potentially 10. Anticipated D/C setting: Home 11. Anticipated post D/C treatments: HH therapy and Home excercise program 12. Overall Rehab/Functional Prognosis: excellent  RECOMMENDATIONS: This patient's condition is appropriate for continued rehabilitative care in  the following setting: Possibly CIR.  Will await formal therapy consults to determine level of functioning relative to baseline. Patient has agreed to participate in recommended program. Potentially Note that insurance prior authorization may be required for reimbursement for recommended care.  Comment: Rehab Admissions Coordinator to follow up.  Delice Lesch, MD 05/02/2015

## 2015-05-02 NOTE — Progress Notes (Signed)
NURSING PROGRESS NOTE  [name] [id number] Discharge Data: 05/02/2015 4:01 PM Attending Provider: Leeanne Rio, MD [id number] [name], MD     [name] to be D/C'd Home per MD order.  Discussed with the patient the After Visit Summary and all questions fully answered. All IV's discontinued with no bleeding noted. All belongings returned to patient for patient to take home.   Last Vital Signs:  Blood pressure 134/85, pulse 75, temperature 98.2 F (36.8 C), temperature source Oral, resp. rate 18, weight 98.113 kg (216 lb 4.8 oz), SpO2 95 %.  Discharge Medication List   Medication List    STOP taking these medications        losartan-hydrochlorothiazide 50-12.5 MG tablet  Commonly known as:  HYZAAR      TAKE these medications        brimonidine 0.2 % ophthalmic solution  Commonly known as:  ALPHAGAN  PLACE 1 DROP INTO THE LEFT EYE 2 (TWO) TIMES DAILY.     dorzolamide-timolol 22.3-6.8 MG/ML ophthalmic solution  Commonly known as:  COSOPT  Place 1 drop into the left eye 2 (two) times daily.     glucose blood test strip  Commonly known as:  ACCU-CHEK AVIVA PLUS  Check sugar 3 times daily     HYDROcodone-acetaminophen 5-325 MG tablet  Commonly known as:  NORCO  Take 1 tablet by mouth every 6 (six) hours as needed.     insulin glargine 100 UNIT/ML injection  Commonly known as:  LANTUS  Inject 0.1 mLs (10 Units total) into the skin every morning.  Start taking on:  05/03/2015     NOVOLOG 100 UNIT/ML injection  Generic drug:  insulin aspart  INJECT 5 UNITS INTO THE SKIN 3 (THREE) TIMES DAILY BEFORE MEALS. DO NOT TAKE IF SUGAR IS <150     VISINE OP  Place 1 drop into the left eye 2 (two) times daily. Only uses in left eye.     vitamin C 500 MG tablet  Commonly known as:  ASCORBIC ACID  Take 500 mg by mouth daily.         Charolette Child, RN

## 2015-05-02 NOTE — Evaluation (Signed)
Occupational Therapy Evaluation Patient Details Name: [name] MRN: [id number] DOB: 03-11-1955 Today's Date: 05/02/2015    History of Present Illness Pt adm with dehiscence of the transtibial amputation after fall at home. Pt had revision on 2/26. Pt had earlier revision on 2/15. PMH - rt BKA 7/15, DM, HTN, schizophremia   Clinical Impression   Patient evaluated by Occupational Therapy with no further acute OT needs identified. All education has been completed and the patient has no further questions. See below for any follow-up Occupational Therapy or equipment needs. OT to sign off. Thank you for referral.      Follow Up Recommendations  No OT follow up;Supervision - Intermittent    Equipment Recommendations  None recommended by OT    Recommendations for Other Services       Precautions / Restrictions Precautions Precautions: Fall      Mobility Bed Mobility Overal bed mobility: Modified Independent             General bed mobility comments: in chair on arrival  Transfers Overall transfer level: Needs assistance Equipment used: Rolling walker (2 wheeled) Transfers: Sit to/from Stand Sit to Stand: Supervision Stand pivot transfers: Supervision       General transfer comment: Assist for safety and balance and lines    Balance Overall balance assessment: History of Falls Sitting-balance support: No upper extremity supported;Feet supported Sitting balance-Leahy Scale: Good     Standing balance support: Bilateral upper extremity supported Standing balance-Leahy Scale: Poor Standing balance comment: support of walker due to bka                            ADL Overall ADL's : At baseline                                       General ADL Comments: Pt demonstrates bathroom transfer at mod I level with RW and management of wound vac. pt currently able to don doff sock shoe on the L LE. Pt verbalizes no concern with d/ chome      Vision     Perception     Praxis      Pertinent Vitals/Pain Pain Assessment: No/denies pain Faces Pain Scale: Hurts little more Pain Location: rt residual limb Pain Descriptors / Indicators: Throbbing;Tender Pain Intervention(s): Limited activity within patient's tolerance;Monitored during session;Repositioned     Hand Dominance Right   Extremity/Trunk Assessment Upper Extremity Assessment Upper Extremity Assessment: Overall WFL for tasks assessed   Lower Extremity Assessment Lower Extremity Assessment: Defer to PT evaluation   Cervical / Trunk Assessment Cervical / Trunk Assessment: Normal   Communication Communication Communication: No difficulties   Cognition Arousal/Alertness: Awake/alert Behavior During Therapy: WFL for tasks assessed/performed Overall Cognitive Status: Within Functional Limits for tasks assessed                     General Comments       Exercises       Shoulder Instructions      Home Living Family/patient expects to be discharged to:: Private residence Living Arrangements: Alone Available Help at Discharge: Family;Available PRN/intermittently Type of Home: Apartment Home Access: Level entry     Home Layout: One level     Bathroom Shower/Tub: Teacher, early years/pre: Standard Bathroom Accessibility:  (via walker not w/c)   Home Equipment: Gilford Rile -  2 wheels;Bedside commode;Wheelchair - manual;Shower seat   Additional Comments: sister helps drive him to appointments and to the grocery store      Prior Functioning/Environment Level of Independence: Independent with assistive device(s)        Comments: Pt has been modified independent at w/c and walker level. Had only been using prosthetic intermittently for 4-5 months prior to surgery in mid February. Used walker without prosthesis. Only amb short distances with walker and no prosthetic.     OT Diagnosis: Generalized weakness   OT Problem List:      OT Treatment/Interventions:      OT Goals(Current goals can be found in the care plan section) Acute Rehab OT Goals Patient Stated Goal: Return home  OT Frequency:     Barriers to D/C:            Co-evaluation              End of Session Equipment Utilized During Treatment: Gait belt;Rolling walker;Other (comment) (wound vac) Nurse Communication: Mobility status;Precautions  Activity Tolerance: Patient tolerated treatment well Patient left: in chair;with call bell/phone within reach;with chair alarm set   Time: 1354-1411 OT Time Calculation (min): 17 min Charges:  OT General Charges $OT Visit: 1 Procedure OT Evaluation $OT Eval Moderate Complexity: 1 Procedure G-Codes:    Peri Maris May 15, 2015, 2:42 PM   Jeri Modena   OTR/L Pager: [phone] Office: [phone] .

## 2015-05-02 NOTE — Discharge Instructions (Signed)
You were seen in the hospital because your stump stitches came out after your fall. Dr. Sharol Given placed a wound vac on your stump. He would like you to make an appointment with him in 1 week.   Your Insulin dose was changed while you were at the hospital. Please take Lantus 10 units in the morning daily. You can take Novolog 3 units before your meals like you usually do.  Please do not take your blood pressure medicine until you are seen at the hospital follow up visit on Friday. We held your blood pressure medicine because your blood pressures were normal while you were in the hospital without the medicine.

## 2015-05-02 NOTE — Progress Notes (Signed)
CSW received consult regarding PT recommendation of SNF at discharge.  Patient is refusing SNF and feels comfortable going home with his sister around to help.  CSW signing off.   [name] LCSWA [phone]

## 2015-05-02 NOTE — Care Management Note (Signed)
Case Management Note  Patient Details  Name: LEANORD DEITRICH MRN: [id number] Date of Birth: Aug 14, 1955  Subjective/Objective:                 From home alone. Revision to R stump wound, wound VAC placed on 05/01/15.   Action/Plan: Pt states will return to home when medically stable. CM to f/u with disposition needs.  Expected Discharge Date:                  Expected Discharge Plan:  Home/Self Care  In-House Referral:     Discharge planning Services  CM Consult  Post Acute Care Choice:    Choice offered to:     DME Arranged:    DME Agency:     HH Arranged:    HH Agency:     Status of Service:  In process, will continue to follow  Medicare Important Message Given:    Date Medicare IM Given:    Medicare IM give by:    Date Additional Medicare IM Given:    Additional Medicare Important Message give by:     If discussed at [place] of Stay Meetings, dates discussed:    Additional Comments:  Sharin Mons, [place] [phone] 05/02/2015, 1:08 PM

## 2015-05-02 NOTE — Progress Notes (Signed)
Inpatient Rehabilitation  Following up after IP Rehab consult earlier today.  PT and OT evaluations now complete.  Note OT is recommending no skilled follow up needs and PT anticipates that patient will discharge at or near baseline.   Met with patient to discuss who states that he feels comfortable discharging home.  Will sign off.    Carmelia Roller., CCC/SLP Admission Coordinator  [place]  Cell [phone]

## 2015-05-02 NOTE — Progress Notes (Signed)
Pt knocked down his wound vacc machine from the bed to the floor and it stopped working, a nurse from the [facility] came over to the floor to check it but could not get it fixed, i end up connecting it to the wound vacc that suppose to be taking home by the pt, the charge nurse Lexine Baton was made aware and the nurse taking over from me Jenny Reichmann is also made aware to contact the wound care nurse to come over to check it

## 2015-05-02 NOTE — Progress Notes (Signed)
Family Medicine Teaching Service Daily Progress Note Intern Pager: [phone]  Patient name: [name] Medical record number: [id number] Date of birth: [birth year] Age: 60 y.o. Gender: male  Primary Care Provider: Tawanna Sat, MD Consultants: Ortho Code Status: Full  Pt Overview and Major Events to Date:  2/24: Fall - R BKA stump dehiscence - stable 2/26: revision of stump dehiscence   Assessment and Plan: [name] is a 60 y.o. male presenting with BKA stump dehiscence after a fall. PMH is significant for HTN and poorly controlled DM.   Right BKA Stump dehiscence after fall, sp revision 2/26: Per history not a syncopal event.  - IV Morphine PRN pain - Maintenance IVF - PM&R consulted, appreciate recs   DM2: A1c 11.7 (12/2014). CBGs 98-198, AM 98.  Home meds: Lantus 20, Novolog 5 units TID with meals.  - Lantus 10u qam  - monitor CBG;sensitive SSI with meals and bedtime   HTN: stable  - held home med due to NPO, since BP is stable will hold off restarting med  - Hydralazine PRN   Microcytic Anemia: Iron Deficiency Anemia Low Fe at 25, TIBC wnl, 8% saturation ratio; Ferritin 90 - Likely start supplement as outpatient or on discharge  FEN/GI: carb modified Prophylaxis: SCD on right leg (no pharmacologic prophylaxis as patient will likely have surgery in the AM, and is currently bleeding)  Disposition: Home pending PT/OT eval and surgery.   Subjective:  Doing well; no questions / concerns. Denies pain.   Objective: Temp:  [97.8 F (36.6 C)-100 F (37.8 C)] 98.2 F (36.8 C) (02/27 0424) Pulse Rate:  [71-84] 75 (02/27 0424) Resp:  [8-23] 18 (02/27 0424) BP: (126-172)/(79-114) 134/85 mmHg (02/27 0424) SpO2:  [93 %-99 %] 95 % (02/27 0424) Physical Exam: General: NAD CV: RRR, loud S2, no murmurs, rubs, or gallops PULM: CTAB, normal effort SKIN: No rash or cyanosis; warm and well-perfused EXTR: No lower extremity edema or calf tenderness; Right BKA with wound  vac; no drainage noted in collection bin. ormal range of motion of right knee.   Laboratory:  Recent Labs Lab 04/29/15 1852 04/30/15 0639 05/01/15 1007  WBC 7.9 8.6  --   HGB 11.9* 11.1* 12.9*  HCT 37.3* 33.6* 38.0*  PLT 329 304  --     Recent Labs Lab 04/29/15 1852 04/30/15 0639 05/01/15 1007  NA 136 143 143  K 3.8 4.2 3.5  CL 100* 106  --   CO2 27 27  --   BUN 16 10  --   CREATININE 1.06 0.99  --   CALCIUM 9.0 8.8*  --   GLUCOSE 414* 206* 154*     Recent Labs Lab 05/01/15 1710 05/01/15 2029 05/02/15 0014 05/02/15 0423 05/02/15 [place]   Smiley Houseman, MD 05/02/2015, 8:16 AM PGY-1, [place] Intern pager: [phone], text pages welcome

## 2015-05-04 ENCOUNTER — Encounter (HOSPITAL_BASED_OUTPATIENT_CLINIC_OR_DEPARTMENT_OTHER): Payer: Medicaid Other

## 2015-05-06 ENCOUNTER — Inpatient Hospital Stay: Payer: Medicaid Other | Admitting: Family Medicine

## 2015-05-18 ENCOUNTER — Other Ambulatory Visit: Payer: Self-pay | Admitting: Family Medicine

## 2015-06-15 ENCOUNTER — Other Ambulatory Visit: Payer: Self-pay | Admitting: *Deleted

## 2015-06-15 MED ORDER — BRIMONIDINE TARTRATE 0.2 % OP SOLN: OPHTHALMIC | Status: DC

## 2015-07-06 ENCOUNTER — Ambulatory Visit (INDEPENDENT_AMBULATORY_CARE_PROVIDER_SITE_OTHER): Payer: Medicaid Other | Admitting: Family Medicine

## 2015-07-06 VITALS — BP 160/120 | HR 122 | Temp 97.9°F

## 2015-07-06 DIAGNOSIS — E1142 Type 2 diabetes mellitus with diabetic polyneuropathy: Secondary | ICD-10-CM

## 2015-07-06 DIAGNOSIS — M7989 Other specified soft tissue disorders: Secondary | ICD-10-CM

## 2015-07-06 DIAGNOSIS — R0601 Orthopnea: Secondary | ICD-10-CM | POA: Diagnosis present

## 2015-07-06 DIAGNOSIS — T879 Unspecified complications of amputation stump: Secondary | ICD-10-CM

## 2015-07-06 DIAGNOSIS — S81009A Unspecified open wound, unspecified knee, initial encounter: Secondary | ICD-10-CM

## 2015-07-06 LAB — CBC
HCT: 39.6 % (ref 38.5–50.0)
Hemoglobin: 11.9 g/dL — ABNORMAL LOW (ref 13.2–17.1)
MCH: 23.5 pg — ABNORMAL LOW (ref 27.0–33.0)
MCHC: 30.1 g/dL — ABNORMAL LOW (ref 32.0–36.0)
MCV: 78.1 fL — ABNORMAL LOW (ref 80.0–100.0)
MPV: 9.5 fL (ref 7.5–12.5)
Platelets: 391 10*3/uL (ref 140–400)
RBC: 5.07 MIL/uL (ref 4.20–5.80)
RDW: 16.2 % — ABNORMAL HIGH (ref 11.0–15.0)
WBC: 9.4 10*3/uL (ref 3.8–10.8)

## 2015-07-06 LAB — BASIC METABOLIC PANEL WITH GFR
BUN: 19 mg/dL (ref 7–25)
CO2: 31 mmol/L (ref 20–31)
Calcium: 8.6 mg/dL (ref 8.6–10.3)
Chloride: 100 mmol/L (ref 98–110)
Creat: 1.03 mg/dL (ref 0.70–1.33)
GFR, Est African American: >89 mL/min (ref >=60)
GFR, Est Non African American: 80 mL/min (ref >=60)
Glucose, Bld: 313 mg/dL — ABNORMAL HIGH (ref 65–99)
Potassium: 4.2 mmol/L (ref 3.5–5.3)
Sodium: 138 mmol/L (ref 135–146)

## 2015-07-06 LAB — POCT GLYCOSYLATED HEMOGLOBIN (HGB A1C): Hemoglobin A1C: 12.3

## 2015-07-06 NOTE — Assessment & Plan Note (Signed)
Home health nursing and PT as above. Follow up 1 week.

## 2015-07-06 NOTE — Progress Notes (Signed)
   Subjective:    Patient ID: [name], male    DOB: 07/28/1955, 60 y.o.   MRN: [id number]  HPI  CC: Cough/SOB  # Cough:  Multiple complaints  Present for "months", at least 2 months  Happens throughout the day, occasionally it doesn't happen for a day or so but otherwise very consistent  Has tried tylenol PM to help him sleep ROS: doesn't snore, has had heartburn in the past but actually better more recently.  # Short of breath  Started 1 month ago  This is primarily at night when he is laying down. He is having difficulty falling asleep. He then needs to sit up for 10-15 minutes, but even then when he lays down he still feels short of breath  He has additionally noted swelling of both his legs that has been present over around the same time frame or possibly since he last had surgery in February  No diagnosis of heart failure in the past ROS: no fevers, no chills, not really SOB during the day  # Knee wounds  Says he has been crawling around on his knees at home to get around  Developed wounds on both knees over the past several weeks  Denies fevers, drainage, doesn't feel like they are getting much worse  # HTN  Antihypertensives stopped while in hospital 2 months ago  Has not been checking at home ROS: denies CP, HA  Social Hx: former smoker  Review of Systems   See HPI for ROS.   Past medical history, surgical, family, and social history reviewed and updated in the EMR as appropriate. Objective:  BP 160/120 mmHg  Pulse 122  Temp(Src) 97.9 F (36.6 C) (Oral)  Wt   SpO2 97% Vitals and nursing note reviewed  General: no apparent distress, in wheel chair CV: normal rate, regular rhythm, NOT tachycardic, no murmur appreciated Resp: there are mild bibasilar crackles but improve with subsequent breaths, no wheezes, rhonchi or crackles, normal effort Extremities: right BKA. There is 2+ pitting edema of right stump and left leg Skin: right knee  inferior to patella anterior wound approx 3cm diameter, no pus, swelling, erythema. Left knee anterior wound more linear approx 1.5cm long, no pus, swelling, erythema.  Assessment & Plan:  Complication of amputated stump ([place]) Has developed a wound on right knee above stump due to walking around on his knees. Discussed that he needs to STOP this in order to get the skin to heal. Home health order placed for nursing to assess wound and physical therapy to see if there is any equipment/adaptation that needs to be done to prevent him from ambulating this way. Follow up 1 week.  Open knee wound Home health nursing and PT as above. Follow up 1 week.  Orthopnea / Leg Swelling / Cough I am favoring diagnosis of new onset HF in the context of these 3 symptoms for at least one month. He does not appear to have pneumonia or respiratory infection, no fevers and cough is non-productive. Work up as below and will have him follow up 1 week. - Echocardiogram; Future - BASIC METABOLIC PANEL WITH GFR - CBC - Brain natriuretic peptide  Diabetic polyneuropathy associated with type 2 diabetes mellitus ([place]) Above issues dominated this office visit, got A1c along with other labs and will discuss at follow up. - POCT glycosylated hemoglobin (Hb A1C) - Ambulatory referral to [facility]

## 2015-07-06 NOTE — Assessment & Plan Note (Signed)
Has developed a wound on right knee above stump due to walking around on his knees. Discussed that he needs to STOP this in order to get the skin to heal. Home health order placed for nursing to assess wound and physical therapy to see if there is any equipment/adaptation that needs to be done to prevent him from ambulating this way. Follow up 1 week.

## 2015-07-06 NOTE — Patient Instructions (Signed)
Echocardiogram: imaging of the heart to evaluate its ability to pump  In the meantime when you sleep, try to sleep with your head elevated (pillows, in a reclining chair, etc)  STOP walking around on your knees. You need to let your wounds heal. We will have the home health nurses and physical therapists come to see what things can change to make sure you are able to get around the house.   Come back in 1 week.

## 2015-07-07 LAB — BRAIN NATRIURETIC PEPTIDE: Brain Natriuretic Peptide: 1617 pg/mL — ABNORMAL HIGH (ref <100)

## 2015-07-08 ENCOUNTER — Emergency Department (HOSPITAL_COMMUNITY): Payer: Medicaid Other

## 2015-07-08 ENCOUNTER — Encounter (HOSPITAL_COMMUNITY): Payer: Self-pay

## 2015-07-08 ENCOUNTER — Other Ambulatory Visit: Payer: Self-pay

## 2015-07-08 ENCOUNTER — Inpatient Hospital Stay (HOSPITAL_COMMUNITY)
Admission: EM | Admit: 2015-07-08 | Discharge: 2015-07-20 | DRG: 287 | Disposition: A | Discharge status: Home Health | Payer: Medicaid Other | Attending: Family Medicine | Admitting: Family Medicine

## 2015-07-08 DIAGNOSIS — E11649 Type 2 diabetes mellitus with hypoglycemia without coma: Secondary | ICD-10-CM | POA: Diagnosis not present

## 2015-07-08 DIAGNOSIS — I5043 Acute on chronic combined systolic (congestive) and diastolic (congestive) heart failure: Secondary | ICD-10-CM | POA: Diagnosis present

## 2015-07-08 DIAGNOSIS — S80912A Unspecified superficial injury of left knee, initial encounter: Secondary | ICD-10-CM | POA: Diagnosis present

## 2015-07-08 DIAGNOSIS — Z9111 Patient's noncompliance with dietary regimen: Secondary | ICD-10-CM | POA: Diagnosis not present

## 2015-07-08 DIAGNOSIS — E785 Hyperlipidemia, unspecified: Secondary | ICD-10-CM | POA: Diagnosis present

## 2015-07-08 DIAGNOSIS — S80911A Unspecified superficial injury of right knee, initial encounter: Secondary | ICD-10-CM | POA: Diagnosis present

## 2015-07-08 DIAGNOSIS — E669 Obesity, unspecified: Secondary | ICD-10-CM | POA: Diagnosis present

## 2015-07-08 DIAGNOSIS — I5021 Acute systolic (congestive) heart failure: Secondary | ICD-10-CM | POA: Diagnosis present

## 2015-07-08 DIAGNOSIS — X58XXXA Exposure to other specified factors, initial encounter: Secondary | ICD-10-CM | POA: Diagnosis present

## 2015-07-08 DIAGNOSIS — E46 Unspecified protein-calorie malnutrition: Secondary | ICD-10-CM

## 2015-07-08 DIAGNOSIS — E1165 Type 2 diabetes mellitus with hyperglycemia: Secondary | ICD-10-CM | POA: Diagnosis present

## 2015-07-08 DIAGNOSIS — I110 Hypertensive heart disease with heart failure: Principal | ICD-10-CM | POA: Diagnosis present

## 2015-07-08 DIAGNOSIS — I2510 Atherosclerotic heart disease of native coronary artery without angina pectoris: Secondary | ICD-10-CM | POA: Diagnosis present

## 2015-07-08 DIAGNOSIS — Z87891 Personal history of nicotine dependence: Secondary | ICD-10-CM | POA: Diagnosis not present

## 2015-07-08 DIAGNOSIS — I509 Heart failure, unspecified: Secondary | ICD-10-CM

## 2015-07-08 DIAGNOSIS — Z9114 Patient's other noncompliance with medication regimen: Secondary | ICD-10-CM | POA: Diagnosis not present

## 2015-07-08 DIAGNOSIS — E1151 Type 2 diabetes mellitus with diabetic peripheral angiopathy without gangrene: Secondary | ICD-10-CM | POA: Diagnosis present

## 2015-07-08 DIAGNOSIS — Z794 Long term (current) use of insulin: Secondary | ICD-10-CM

## 2015-07-08 DIAGNOSIS — T465X6A Underdosing of other antihypertensive drugs, initial encounter: Secondary | ICD-10-CM | POA: Diagnosis present

## 2015-07-08 DIAGNOSIS — H5442 Blindness, left eye, normal vision right eye: Secondary | ICD-10-CM | POA: Diagnosis present

## 2015-07-08 DIAGNOSIS — I420 Dilated cardiomyopathy: Secondary | ICD-10-CM | POA: Diagnosis present

## 2015-07-08 DIAGNOSIS — N401 Enlarged prostate with lower urinary tract symptoms: Secondary | ICD-10-CM | POA: Diagnosis present

## 2015-07-08 DIAGNOSIS — R0600 Dyspnea, unspecified: Secondary | ICD-10-CM

## 2015-07-08 DIAGNOSIS — R778 Other specified abnormalities of plasma proteins: Secondary | ICD-10-CM

## 2015-07-08 DIAGNOSIS — N5089 Other specified disorders of the male genital organs: Secondary | ICD-10-CM | POA: Diagnosis not present

## 2015-07-08 DIAGNOSIS — I10 Essential (primary) hypertension: Secondary | ICD-10-CM | POA: Diagnosis not present

## 2015-07-08 DIAGNOSIS — E1121 Type 2 diabetes mellitus with diabetic nephropathy: Secondary | ICD-10-CM | POA: Diagnosis present

## 2015-07-08 DIAGNOSIS — Z89511 Acquired absence of right leg below knee: Secondary | ICD-10-CM | POA: Diagnosis not present

## 2015-07-08 DIAGNOSIS — Z8249 Family history of ischemic heart disease and other diseases of the circulatory system: Secondary | ICD-10-CM | POA: Diagnosis not present

## 2015-07-08 DIAGNOSIS — R350 Frequency of micturition: Secondary | ICD-10-CM | POA: Diagnosis not present

## 2015-07-08 DIAGNOSIS — Z6825 Body mass index (BMI) 25.0-25.9, adult: Secondary | ICD-10-CM | POA: Diagnosis not present

## 2015-07-08 DIAGNOSIS — R3912 Poor urinary stream: Secondary | ICD-10-CM | POA: Diagnosis not present

## 2015-07-08 DIAGNOSIS — I272 Other secondary pulmonary hypertension: Secondary | ICD-10-CM | POA: Diagnosis present

## 2015-07-08 DIAGNOSIS — K5900 Constipation, unspecified: Secondary | ICD-10-CM | POA: Diagnosis not present

## 2015-07-08 DIAGNOSIS — E1142 Type 2 diabetes mellitus with diabetic polyneuropathy: Secondary | ICD-10-CM | POA: Diagnosis present

## 2015-07-08 DIAGNOSIS — L8990 Pressure ulcer of unspecified site, unspecified stage: Secondary | ICD-10-CM | POA: Diagnosis present

## 2015-07-08 DIAGNOSIS — T383X6A Underdosing of insulin and oral hypoglycemic [antidiabetic] drugs, initial encounter: Secondary | ICD-10-CM | POA: Diagnosis present

## 2015-07-08 DIAGNOSIS — F209 Schizophrenia, unspecified: Secondary | ICD-10-CM | POA: Diagnosis present

## 2015-07-08 DIAGNOSIS — E440 Moderate protein-calorie malnutrition: Secondary | ICD-10-CM | POA: Diagnosis present

## 2015-07-08 DIAGNOSIS — I428 Other cardiomyopathies: Secondary | ICD-10-CM | POA: Diagnosis present

## 2015-07-08 DIAGNOSIS — M7989 Other specified soft tissue disorders: Secondary | ICD-10-CM | POA: Diagnosis present

## 2015-07-08 DIAGNOSIS — N179 Acute kidney failure, unspecified: Secondary | ICD-10-CM | POA: Diagnosis not present

## 2015-07-08 DIAGNOSIS — I5041 Acute combined systolic (congestive) and diastolic (congestive) heart failure: Secondary | ICD-10-CM | POA: Diagnosis not present

## 2015-07-08 DIAGNOSIS — I5042 Chronic combined systolic (congestive) and diastolic (congestive) heart failure: Secondary | ICD-10-CM | POA: Insufficient documentation

## 2015-07-08 DIAGNOSIS — R7989 Other specified abnormal findings of blood chemistry: Secondary | ICD-10-CM | POA: Diagnosis not present

## 2015-07-08 HISTORY — DX: Dilated cardiomyopathy: I42.0

## 2015-07-08 LAB — COMPREHENSIVE METABOLIC PANEL
ALT: 45 U/L (ref 17–63)
AST: 36 U/L (ref 15–41)
Albumin: 2.7 g/dL — ABNORMAL LOW (ref 3.5–5.0)
Alkaline Phosphatase: 137 U/L — ABNORMAL HIGH (ref 38–126)
Anion gap: 9 (ref 5–15)
BUN: 19 mg/dL (ref 6–20)
CO2: 28 mmol/L (ref 22–32)
Calcium: 9 mg/dL (ref 8.9–10.3)
Chloride: 101 mmol/L (ref 101–111)
Creatinine, Ser: 1.15 mg/dL (ref 0.61–1.24)
GFR calc Af Amer: >60 mL/min (ref >60)
GFR calc non Af Amer: >60 mL/min (ref >60)
Glucose, Bld: 264 mg/dL — ABNORMAL HIGH (ref 65–99)
Potassium: 4.3 mmol/L (ref 3.5–5.1)
Sodium: 138 mmol/L (ref 135–145)
Total Bilirubin: 1 mg/dL (ref 0.3–1.2)
Total Protein: 6.3 g/dL — ABNORMAL LOW (ref 6.5–8.1)

## 2015-07-08 LAB — CBC WITH DIFFERENTIAL/PLATELET
Basophils Absolute: 0 10*3/uL (ref 0.0–0.1)
Basophils Relative: 0 %
Eosinophils Absolute: 0 10*3/uL (ref 0.0–0.7)
Eosinophils Relative: 0 %
HCT: 39.1 % (ref 39.0–52.0)
Hemoglobin: 12.1 g/dL — ABNORMAL LOW (ref 13.0–17.0)
Lymphocytes Relative: 11 %
Lymphs Abs: 1.1 10*3/uL (ref 0.7–4.0)
MCH: 23.5 pg — ABNORMAL LOW (ref 26.0–34.0)
MCHC: 30.9 g/dL (ref 30.0–36.0)
MCV: 76.1 fL — ABNORMAL LOW (ref 78.0–100.0)
Monocytes Absolute: 0.9 10*3/uL (ref 0.1–1.0)
Monocytes Relative: 9 %
Neutro Abs: 8.1 10*3/uL — ABNORMAL HIGH (ref 1.7–7.7)
Neutrophils Relative %: 80 %
Platelets: 386 10*3/uL (ref 150–400)
RBC: 5.14 MIL/uL (ref 4.22–5.81)
RDW: 15.3 % (ref 11.5–15.5)
WBC: 10.2 10*3/uL (ref 4.0–10.5)

## 2015-07-08 LAB — I-STAT TROPONIN, ED: Troponin i, poc: 0.21 ng/mL (ref 0.00–0.08)

## 2015-07-08 LAB — BRAIN NATRIURETIC PEPTIDE: B Natriuretic Peptide: 1764.5 pg/mL — ABNORMAL HIGH (ref 0.0–100.0)

## 2015-07-08 MED ORDER — FUROSEMIDE 10 MG/ML IJ SOLN
40.0000 mg | INTRAMUSCULAR | Status: AC
  Administered 2015-07-08: 40 mg via INTRAVENOUS
  Filled 2015-07-08: qty 4

## 2015-07-08 NOTE — H&P (Signed)
[facility] Admission History and Physical Service Pager: [phone]  Patient name: [name] Medical record number: [id number] Date of birth: [birth year] Age: 60 y.o. Gender: male  Primary Care Provider: Tawanna Sat, MD Consultants: None Code Status: Full (per discussion on admission)  Chief Complaint: swelling  Assessment and Plan: [name] is a 60 y.o. male presenting with new onset CHF . PMH is significant for hypertension, schizophrenia, diabetes mellitus with peripheral neuropathy.   Cough/orthopnea/edema: most likely he has a new onset of heart failure with symptoms include edematous abdomen, scrotum, and legs, increased cough and orthopnea. EKG: sinus rhythm, Qtc prolonged (501), Istat troponin: 0.21, CXR: cardiomegaly and right sided pleural effusion. BNP 1764.5.  No signs of hepatic failure as LFTS WNL. Marland Kitchen   - Admit to [facility], attending Dr. Nori Riis - Vital signs per floor protocol - Echocardiogram - s/p 40 mg Lasix in ED, patient still has not voided after 1 hour. Will obtain bladder scan and In/Out cath. Will add more Lasix if able to spontaneously void - U/S scrotum - Cards consult in AM - AM BMP - AM EKG - trend troponins - Begin ASA 81 mg daily  Scrotal/penile swelling: most likely it is associated with new onset heart failure. [place] ruled out testicular torsion and having no pain. No suggestion of infection. Nothing on exam to suggest hernia.  - may have to periodically in/out cath to avoid any obstruction.   Knee wounds and S/p R BKA. Patient has been crawling around on his knees at home to get around. Has a wheelchair and prosthetic leg but does not use regularly. Developed wounds on both knees.Denies fevers, drainage, doesn't feel like they are getting much worse - Wound care consult - PT/OT  T2DM, uncontrolled: Last A1c 12.3 on 07/06/15. Home meds: Lantus 10 U every morning, Novolog 5 U TID before meals.  - Continue home meds -  Moderate SSI - Continue to monitor CBGs.  HTN: Elevated BP of 162/121 on admission, no 147/101. 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 Pupil non-reactive and dilated. Followed by ophtho as an outpatient. - Continue home Brimodine, Cosopt, and visine drops in left eye.   FEN/GI: Heart healthy/carb modified diet, KVO Prophylaxis: SQ Heparin  Disposition: Admit to telemetry, attending Dr. Nori Riis  History of Present Illness:  [name] is a 60 y.o. male presenting with swelling in his legs and groin since this past Monday.   He saw his primary doctor on 5/3. Labs were drawn during this visit for suspect heart failure.  No prior history of similar symptoms. No current dyspnea or shortness of breath. He had a revision in his BKA in Feb and the swelling has never gone down. He has a prosthetic and doesn't wear it due to an ulcer. Historically sleeps flat but has had to increase the number of pillows behind his head. Has had a dry cough for about two months. Cough has stayed the same and worse at night.  Doesn't wear any O2. Reports he hasn't taken any blood pressure medications since this February.  He trasports in his wheel chair or walks on his knees. Denies any palpitations.  He doesn't have any vision in his left eye.   Review Of Systems: Per HPI with the following additions: See HPI  Otherwise the remainder of the systems were  negative.  Patient Active Problem List   Diagnosis Date Noted  . CHF (congestive heart failure) ([place]) 07/08/2015  . Open knee wound 07/06/2015  . Post-operative pain   . Non compliance w medication regimen   . Acute blood loss anemia   . AKI (acute kidney injury) ([place])   . Complication of amputated stump ([place]) 04/29/2015  . History of right below knee amputation ([place]) 02/22/2015  . PAD (peripheral  artery disease) ([place]) 02/22/2015  . Blindness of left eye 01/04/2015  . Hypertensive urgency 08/06/2014  . BKA stump complication ([place]) 0000000  . Diabetic neuropathy associated with type 2 diabetes mellitus ([place]) 12/01/2013  . Hx of BKA 09/28/2013  . Cellulitis 09/19/2013  . Abdominal pain, unspecified site 09/06/2013  . Noncompliance with medications 07/13/2010  . OTHER SPEC TYPES SCHIZOPHRENIA UNSPEC CONDITION 05/24/2009  . Obesity, unspecified 06/09/2007  . DM (diabetes mellitus), type 2, uncontrolled ([place]) 05/02/2006  . HYPERTENSION, BENIGN SYSTEMIC 05/02/2006    Past Medical History: Past Medical History  Diagnosis Date  . Diabetes mellitus   . Hypertension     Past Surgical History: Past Surgical History  Procedure Laterality Date  . I&d extremity Right 09/24/2013    Procedure: IRRIGATION AND DEBRIDEMENT RIGHT FOOT ULCER;  Surgeon: Renette Butters, MD;  Location: [facility];  Service: Orthopedics;  Laterality: Right;  . I&d extremity Right 09/26/2013    Procedure: Repeat IRRIGATION AND DEBRIDEMENT Right Foot Ulcer;  Surgeon: Rozanna Box, MD;  Location: [facility];  Service: Orthopedics;  Laterality: Right;  . Amputation Right 09/26/2013    Procedure: AMPUTATION BELOW KNEE;  Surgeon: Rozanna Box, MD;  Location: [facility];  Service: Orthopedics;  Laterality: Right;  . Tonsillectomy    . Colonoscopy    . Multiple tooth extractions    . Stump revision Right 04/20/2015    Procedure: Revision Right Below Knee Amputation, Apply Wound VAC;  Surgeon: Newt Minion, MD;  Location: [facility];  Service: Orthopedics;  Laterality: Right;  . Amputation Right 05/01/2015    Procedure: REVISION OF RIGHT TRANSTIBIAL AMPUTATION ;  Surgeon: Newt Minion, MD;  Location: [facility];  Service: Orthopedics;  Laterality: Right;    Social History: Social History  Substance Use Topics  . Smoking status: Former Smoker    Quit date: 05/10/2000  . Smokeless tobacco: Former Systems developer    Quit date: 02/22/2000  .  Alcohol Use: No   Additional social history: former tobacco user  Please also refer to relevant sections of EMR.  Family History: Family History  Problem Relation Age of Onset  . Cancer Mother   . Peripheral vascular disease Father     Allergies and Medications: No Known Allergies No current facility-administered medications on file prior to encounter.   Current Outpatient Prescriptions on File Prior to Encounter  Medication Sig Dispense Refill  . brimonidine (ALPHAGAN) 0.2 % ophthalmic solution PLACE 1 DROP INTO THE LEFT EYE 2 (TWO) TIMES DAILY. 5 mL 0  . dorzolamide-timolol (COSOPT) 22.3-6.8 MG/ML ophthalmic solution Place 1 drop into the left eye 2 (two) times daily. 10 mL 12  . HYDROcodone-acetaminophen (NORCO) 5-325 MG tablet Take 1 tablet by mouth every 6 (six) hours as needed. (Patient taking differently: Take 1 tablet by mouth every 6 (six) hours as needed for moderate pain. ) 30 tablet 0  . insulin glargine (LANTUS) 100 UNIT/ML injection Inject 0.1 mLs (10 Units total) into the skin every morning. 10 mL 3  . NOVOLOG 100 UNIT/ML injection INJECT 5  UNITS INTO THE SKIN 3 (THREE) TIMES DAILY BEFORE MEALS. DO NOT TAKE IF SUGAR IS <150 30 mL 2  . Tetrahydrozoline HCl (VISINE OP) Place 1 drop into the left eye 2 (two) times daily. Only uses in left eye.    . vitamin C (ASCORBIC ACID) 500 MG tablet Take 500 mg by mouth daily.    Marland Kitchen ACCU-CHEK AVIVA PLUS test strip CHECK SUGAR 3 TIMES DAILY 100 each 11    Objective: BP 146/120 mmHg  Pulse 88  Temp(Src) 98.3 F (36.8 C) (Oral)  Resp 15  SpO2 95% Exam: General: In NAD, sitting up in bed, well developed Eyes: Left pupil is nonworking ENTM: L eye cataract noted; Right pupil miotic. EOMI Neck: supple, normal ROM Cardiovascular: Irregular rhythm, loud S2, no murmurs, rubs, or gallops. 3+ edema in left lower extremity from foot to mid calf. 2+ edema in right stump. 2+ dorsalis pedis pulse in left Respiratory: CTAB, normal  effort Abdomen: distended, non tender, normal bowel sounds, possible positive fluid wave MSK: Right BKA Genitals: severely edematous scrotum and penis  Skin:  Healing ulcer on bottom of right stump and 1.5 cm x 1.5 circular lesion on the anterior to right stump. Healing ulcer on the anterior left knee. No sacral ulcers.  Neuro: Awake, alert, no focal deficits grossly, normal speech Psych: Mood and affect euthymic, normal rate and volume of speech.   Labs and Imaging: CBC BMET   Recent Labs Lab 07/08/15 1825  WBC 10.2  HGB 12.1*  HCT 39.1  PLT 386    Recent Labs Lab 07/08/15 1825  NA 138  K 4.3  CL 101  CO2 28  BUN 19  CREATININE 1.15  GLUCOSE 264*  CALCIUM 9.0     Dg Chest 2 View  07/08/2015  CLINICAL DATA:  Swelling in the bilateral legs. Shortness of breath. EXAM: CHEST  2 VIEW COMPARISON:  August 11, 2004 FINDINGS: Marked cardiomegaly is identified. No pneumothorax. No overt edema. No pulmonary nodules, masses, or infiltrates. A layering effusion, probably on the right, is best appreciated on the lateral view. No other acute abnormalities. IMPRESSION: Cardiomegaly and right-sided pleural effusion.  No overt edema. Electronically Signed   By: Dorise Bullion III M.D   On: 07/08/2015 21:10   US Scrotum  07/08/2015  CLINICAL DATA:  60 year old male with scrotal pain and swelling. EXAM: SCROTAL ULTRASOUND DOPPLER ULTRASOUND OF THE TESTICLES TECHNIQUE: Complete ultrasound examination of the testicles, epididymis, and other scrotal structures was performed. Color and spectral Doppler ultrasound were also utilized to evaluate blood flow to the testicles. COMPARISON:  None. FINDINGS: Right testicle Measurements: 4.4 x 3 x 3 cm.  No mass or microlithiasis visualized. Left testicle Measurements: 4 x 2.8 x 2.9 cm. No mass or microlithiasis visualized. Right epididymis:  A 5 mm cyst/spermatocele is noted. Left epididymis:  Normal in size and appearance. Hydrocele:  None visualized. Varicocele:   None visualized. Pulsed Doppler interrogation of both testes demonstrates normal low resistance arterial and venous waveforms bilaterally. Diffuse scrotal wall edema and thickening noted. IMPRESSION: Diffuse scrotal wall edema. Normal testicles.  No evidence of testicular mass or torsion. Electronically Signed   By: Margarette Canada M.D.   On: 07/08/2015 20:00   [place] Art/ven Flow Abd Pelv Doppler  07/08/2015  CLINICAL DATA:  60 year old male with scrotal pain and swelling. EXAM: SCROTAL ULTRASOUND DOPPLER ULTRASOUND OF THE TESTICLES TECHNIQUE: Complete ultrasound examination of the testicles, epididymis, and other scrotal structures was performed. Color and spectral Doppler ultrasound were  also utilized to evaluate blood flow to the testicles. COMPARISON:  None. FINDINGS: Right testicle Measurements: 4.4 x 3 x 3 cm.  No mass or microlithiasis visualized. Left testicle Measurements: 4 x 2.8 x 2.9 cm. No mass or microlithiasis visualized. Right epididymis:  A 5 mm cyst/spermatocele is noted. Left epididymis:  Normal in size and appearance. Hydrocele:  None visualized. Varicocele:  None visualized. Pulsed Doppler interrogation of both testes demonstrates normal low resistance arterial and venous waveforms bilaterally. Diffuse scrotal wall edema and thickening noted. IMPRESSION: Diffuse scrotal wall edema. Normal testicles.  No evidence of testicular mass or torsion. Electronically Signed   By: Margarette Canada M.D.   On: 07/08/2015 20:00     Carlyle Dolly, MD 07/08/2015, 11:22 PM PGY-1, [facility] pager: [phone], text pages welcome  Upper Level Addendum:  I have seen and evaluated this patient along with Dr. Juanito Doom and reviewed the above note, making necessary revisions in [facility].   Clearance Coots, MD Family Medicine PGY-3

## 2015-07-08 NOTE — ED Notes (Signed)
Pt back from US

## 2015-07-08 NOTE — ED Provider Notes (Signed)
CSN: [id number]     Arrival date & time 07/08/15  1821 History   First MD Initiated Contact with Patient 07/08/15 2003     Chief Complaint  Patient presents with  . Groin Swelling     (Consider location/radiation/quality/duration/timing/severity/associated sxs/prior Treatment) HPI Comments: Patient presents to the emergency department with chief complaint of testicular swelling.  He states that he first noticed the swelling on Monday. He states that it is progressively worsened. He states that it is not painful. He denies any associated fevers or chills. He does report some associated shortness of breath, but denies any chest pain. He also reports lower extreme extremity swelling in his thighs. There are no modifying factors. He has not taken anything for her symptoms.  The history is provided by the patient. No language interpreter was used.    Past Medical History  Diagnosis Date  . Diabetes mellitus   . Hypertension    Past Surgical History  Procedure Laterality Date  . I&d extremity Right 09/24/2013    Procedure: IRRIGATION AND DEBRIDEMENT RIGHT FOOT ULCER;  Surgeon: Renette Butters, MD;  Location: [place];  Service: Orthopedics;  Laterality: Right;  . I&d extremity Right 09/26/2013    Procedure: Repeat IRRIGATION AND DEBRIDEMENT Right Foot Ulcer;  Surgeon: Rozanna Box, MD;  Location: [place];  Service: Orthopedics;  Laterality: Right;  . Amputation Right 09/26/2013    Procedure: AMPUTATION BELOW KNEE;  Surgeon: Rozanna Box, MD;  Location: [place];  Service: Orthopedics;  Laterality: Right;  . Tonsillectomy    . Colonoscopy    . Multiple tooth extractions    . Stump revision Right 04/20/2015    Procedure: Revision Right Below Knee Amputation, Apply Wound VAC;  Surgeon: Newt Minion, MD;  Location: [place];  Service: Orthopedics;  Laterality: Right;  . Amputation Right 05/01/2015    Procedure: REVISION OF RIGHT TRANSTIBIAL AMPUTATION ;  Surgeon: Newt Minion, MD;  Location: [place];   Service: Orthopedics;  Laterality: Right;   Family History  Problem Relation Age of Onset  . Cancer Mother   . Peripheral vascular disease Father    Social History  Substance Use Topics  . Smoking status: Former Smoker    Quit date: 05/10/2000  . Smokeless tobacco: Former Systems developer    Quit date: 02/22/2000  . Alcohol Use: No    Review of Systems  Constitutional: Negative for fever and chills.  Respiratory: Negative for shortness of breath.   Cardiovascular: Positive for leg swelling. Negative for chest pain.  Gastrointestinal: Negative for nausea, vomiting, diarrhea and constipation.  Genitourinary: Negative for dysuria.       Scrotal swelling  All other systems reviewed and are negative.     Allergies  Review of patient's allergies indicates no known allergies.  Home Medications   Prior to Admission medications   Medication Sig Start Date End Date Taking? Authorizing Provider  ACCU-CHEK AVIVA PLUS test strip CHECK SUGAR 3 TIMES DAILY 05/18/15   Leone Brand, MD  brimonidine (ALPHAGAN) 0.2 % ophthalmic solution PLACE 1 DROP INTO THE LEFT EYE 2 (TWO) TIMES DAILY. 06/15/15   Mariel Aloe, MD  dorzolamide-timolol (COSOPT) 22.3-6.8 MG/ML ophthalmic solution Place 1 drop into the left eye 2 (two) times daily. 09/28/13   Virginia Crews, MD  HYDROcodone-acetaminophen (NORCO) 5-325 MG tablet Take 1 tablet by mouth every 6 (six) hours as needed. Patient taking differently: Take 1 tablet by mouth every 6 (six) hours as needed for moderate pain.  04/20/15   Newt Minion, MD  insulin glargine (LANTUS) 100 UNIT/ML injection Inject 0.1 mLs (10 Units total) into the skin every morning. 05/03/15   Smiley Houseman, MD  NOVOLOG 100 UNIT/ML injection INJECT 5 UNITS INTO THE SKIN 3 (THREE) TIMES DAILY BEFORE MEALS. DO NOT TAKE IF SUGAR IS <150 09/07/14   Leone Brand, MD  Tetrahydrozoline HCl (VISINE OP) Place 1 drop into the left eye 2 (two) times daily. Only uses in left eye.     Historical Provider, MD  vitamin C (ASCORBIC ACID) 500 MG tablet Take 500 mg by mouth daily.    Historical Provider, MD   BP 156/114 mmHg  Pulse 92  Temp(Src) 98.3 F (36.8 C) (Oral)  Resp 18  SpO2 96% Physical Exam  Constitutional: He is oriented to person, place, and time. He appears well-developed and well-nourished.  HENT:  Head: Normocephalic and atraumatic.  Eyes: Conjunctivae and EOM are normal. Pupils are equal, round, and reactive to light. Right eye exhibits no discharge. Left eye exhibits no discharge. No scleral icterus.  Neck: Normal range of motion. Neck supple. No JVD present.  Cardiovascular: Normal rate, regular rhythm and normal heart sounds.  Exam reveals no gallop and no friction rub.   No murmur heard. Pulmonary/Chest: Effort normal and breath sounds normal. No respiratory distress. He has no wheezes. He has no rales. He exhibits no tenderness.  Abdominal: Soft. He exhibits no distension and no mass. There is no tenderness. There is no rebound and no guarding.  Genitourinary:  Very large fluid-filled scrotum, nontender to palpation, no evidence of cellulitis or abscess, perineum is unremarkable, no evidence of infection  Musculoskeletal: Normal range of motion. He exhibits edema. He exhibits no tenderness.  2+ pitting edema in bilateral thighs  Neurological: He is alert and oriented to person, place, and time.  Skin: Skin is warm and dry.  Psychiatric: He has a normal mood and affect. His behavior is normal. Judgment and thought content normal.  Nursing note and vitals reviewed.   ED Course  Procedures (including critical care time) Labs Review Labs Reviewed  CBC WITH DIFFERENTIAL/PLATELET - Abnormal; Notable for the following:    Hemoglobin 12.1 (*)    MCV 76.1 (*)    MCH 23.5 (*)    Neutro Abs 8.1 (*)    All other components within normal limits  COMPREHENSIVE METABOLIC PANEL - Abnormal; Notable for the following:    Glucose, Bld 264 (*)    Total  Protein 6.3 (*)    Albumin 2.7 (*)    Alkaline Phosphatase 137 (*)    All other components within normal limits  URINALYSIS, ROUTINE W REFLEX MICROSCOPIC (NOT AT [facility])  BRAIN NATRIURETIC PEPTIDE  I-STAT TROPOININ, ED    Imaging Review US Scrotum  07/08/2015  CLINICAL DATA:  60 year old male with scrotal pain and swelling. EXAM: SCROTAL ULTRASOUND DOPPLER ULTRASOUND OF THE TESTICLES TECHNIQUE: Complete ultrasound examination of the testicles, epididymis, and other scrotal structures was performed. Color and spectral Doppler ultrasound were also utilized to evaluate blood flow to the testicles. COMPARISON:  None. FINDINGS: Right testicle Measurements: 4.4 x 3 x 3 cm.  No mass or microlithiasis visualized. Left testicle Measurements: 4 x 2.8 x 2.9 cm. No mass or microlithiasis visualized. Right epididymis:  A 5 mm cyst/spermatocele is noted. Left epididymis:  Normal in size and appearance. Hydrocele:  None visualized. Varicocele:  None visualized. Pulsed Doppler interrogation of both testes demonstrates normal low resistance arterial and venous waveforms bilaterally.  Diffuse scrotal wall edema and thickening noted. IMPRESSION: Diffuse scrotal wall edema. Normal testicles.  No evidence of testicular mass or torsion. Electronically Signed   By: Margarette Canada M.D.   On: 07/08/2015 20:00   [place] Art/ven Flow Abd Pelv Doppler  07/08/2015  CLINICAL DATA:  60 year old male with scrotal pain and swelling. EXAM: SCROTAL ULTRASOUND DOPPLER ULTRASOUND OF THE TESTICLES TECHNIQUE: Complete ultrasound examination of the testicles, epididymis, and other scrotal structures was performed. Color and spectral Doppler ultrasound were also utilized to evaluate blood flow to the testicles. COMPARISON:  None. FINDINGS: Right testicle Measurements: 4.4 x 3 x 3 cm.  No mass or microlithiasis visualized. Left testicle Measurements: 4 x 2.8 x 2.9 cm. No mass or microlithiasis visualized. Right epididymis:  A 5 mm cyst/spermatocele is  noted. Left epididymis:  Normal in size and appearance. Hydrocele:  None visualized. Varicocele:  None visualized. Pulsed Doppler interrogation of both testes demonstrates normal low resistance arterial and venous waveforms bilaterally. Diffuse scrotal wall edema and thickening noted. IMPRESSION: Diffuse scrotal wall edema. Normal testicles.  No evidence of testicular mass or torsion. Electronically Signed   By: Margarette Canada M.D.   On: 07/08/2015 20:00   I have personally reviewed and evaluated these images and lab results as part of my medical decision-making.   EKG Interpretation   Date/Time:  Friday Jul 08 2015 20:38:49 EDT Ventricular Rate:  91 PR Interval:  168 QRS Duration: 90 QT Interval:  407 QTC Calculation: 501 R Axis:   -39 Text Interpretation:  Sinus rhythm Atrial premature complex Probable left  atrial enlargement Left axis deviation Nonspecific T abnormalities,  lateral leads Borderline prolonged QT interval Confirmed by Jeneen Rinks  MD,  Er[place] ([id number]) on 07/08/2015 10:36:44 PM      MDM   Final diagnoses:  Congestive heart failure, unspecified congestive heart failure chronicity, unspecified congestive heart failure type (HCC)  Elevated troponin    Patient with scrotal swelling, thought to be secondary to probable CHF. Patient has had some shortness of breath. Will check BNP, and chest x-ray. Patient has bilateral lower extremity edema in his thighs. There is no evidence of abscess or cellulitis to the scrotum or perineum. Patient is diabetic, but highly doubt Fournier's gangrene, vital signs are normal, and again there is no evidence of infection. Testicular ultrasound is negative for torsion.  BNP is 1764, mild troponin elevation 0.21, chest x-ray remarkable for cardiomegaly and right-sided pleural effusion. Patient thought to have CHF exacerbation. Will give Lasix. Patient seen by and discussed with Dr. Jeneen Rinks, who agrees with plan for admission.  Appreciate family practice  residents for admitting the patient.  No ischemic EKG changes.    Montine Circle, PA-C 07/08/15 [place], MD 07/11/15 [phone]

## 2015-07-08 NOTE — ED Notes (Addendum)
Pt here with c/o groin swelling and testicles appear very enlarged, estimating to be side of a cantaloupe. His penis is also swelling. HHe reports he urinated about 3 hours ago but it was not much. He denies pain. Right leg BKA.

## 2015-07-09 ENCOUNTER — Other Ambulatory Visit: Payer: Self-pay

## 2015-07-09 ENCOUNTER — Encounter (HOSPITAL_COMMUNITY): Payer: Self-pay

## 2015-07-09 DIAGNOSIS — R7989 Other specified abnormal findings of blood chemistry: Secondary | ICD-10-CM | POA: Insufficient documentation

## 2015-07-09 DIAGNOSIS — L8990 Pressure ulcer of unspecified site, unspecified stage: Secondary | ICD-10-CM | POA: Insufficient documentation

## 2015-07-09 DIAGNOSIS — R778 Other specified abnormalities of plasma proteins: Secondary | ICD-10-CM | POA: Insufficient documentation

## 2015-07-09 DIAGNOSIS — N5089 Other specified disorders of the male genital organs: Secondary | ICD-10-CM | POA: Insufficient documentation

## 2015-07-09 DIAGNOSIS — I5021 Acute systolic (congestive) heart failure: Secondary | ICD-10-CM

## 2015-07-09 DIAGNOSIS — R0600 Dyspnea, unspecified: Secondary | ICD-10-CM | POA: Diagnosis present

## 2015-07-09 LAB — TROPONIN I
Troponin I: 0.32 ng/mL — ABNORMAL HIGH (ref <0.031)
Troponin I: 0.31 ng/mL — ABNORMAL HIGH (ref <0.031)
Troponin I: 0.26 ng/mL — ABNORMAL HIGH (ref <0.031)

## 2015-07-09 LAB — GLUCOSE, CAPILLARY
Glucose-Capillary: 199 mg/dL — ABNORMAL HIGH (ref 65–99)
Glucose-Capillary: 171 mg/dL — ABNORMAL HIGH (ref 65–99)
Glucose-Capillary: 43 mg/dL — CL (ref 65–99)
Glucose-Capillary: 89 mg/dL (ref 65–99)

## 2015-07-09 LAB — BASIC METABOLIC PANEL
Anion gap: 10 (ref 5–15)
BUN: 17 mg/dL (ref 6–20)
CO2: 28 mmol/L (ref 22–32)
Calcium: 8.9 mg/dL (ref 8.9–10.3)
Chloride: 102 mmol/L (ref 101–111)
Creatinine, Ser: 0.99 mg/dL (ref 0.61–1.24)
GFR calc Af Amer: >60 mL/min (ref >60)
GFR calc non Af Amer: >60 mL/min (ref >60)
Glucose, Bld: 221 mg/dL — ABNORMAL HIGH (ref 65–99)
Potassium: 4 mmol/L (ref 3.5–5.1)
Sodium: 140 mmol/L (ref 135–145)

## 2015-07-09 LAB — URINE MICROSCOPIC-ADD ON

## 2015-07-09 LAB — URINALYSIS, ROUTINE W REFLEX MICROSCOPIC
Bilirubin Urine: NEGATIVE
Glucose, UA: NEGATIVE mg/dL
Ketones, ur: NEGATIVE mg/dL
Leukocytes, UA: NEGATIVE
Nitrite: NEGATIVE
Protein, ur: 100 mg/dL — AB
Specific Gravity, Urine: 1.019 (ref 1.005–1.030)
pH: 5.5 (ref 5.0–8.0)

## 2015-07-09 MED ORDER — POLYETHYLENE GLYCOL 3350 17 G PO PACK: 17.0000 g | PACK | Freq: Every day | ORAL | Status: DC | PRN

## 2015-07-09 MED ORDER — INSULIN ASPART 100 UNIT/ML ~~LOC~~ SOLN: 0.0000 [IU] | Freq: Three times a day (TID) | SUBCUTANEOUS | Status: DC

## 2015-07-09 MED ORDER — TETRAHYDROZOLINE HCL 0.05 % OP SOLN
1.0000 [drp] | Freq: Two times a day (BID) | OPHTHALMIC | Status: DC
  Administered 2015-07-09 – 2015-07-20 (×22): 1 [drp] via OPHTHALMIC
  Filled 2015-07-09 (×2): qty 15

## 2015-07-09 MED ORDER — BRIMONIDINE TARTRATE 0.2 % OP SOLN
1.0000 [drp] | Freq: Two times a day (BID) | OPHTHALMIC | Status: DC
  Administered 2015-07-09 – 2015-07-20 (×22): 1 [drp] via OPHTHALMIC
  Filled 2015-07-09: qty 5

## 2015-07-09 MED ORDER — INSULIN ASPART 100 UNIT/ML ~~LOC~~ SOLN
0.0000 [IU] | Freq: Three times a day (TID) | SUBCUTANEOUS | Status: DC
  Administered 2015-07-09 (×2): 3 [IU] via SUBCUTANEOUS

## 2015-07-09 MED ORDER — INSULIN ASPART 100 UNIT/ML ~~LOC~~ SOLN
5.0000 [IU] | Freq: Three times a day (TID) | SUBCUTANEOUS | Status: DC
  Administered 2015-07-09 – 2015-07-11 (×3): 5 [IU] via SUBCUTANEOUS

## 2015-07-09 MED ORDER — ACETAMINOPHEN 325 MG PO TABS: 650.0000 mg | ORAL_TABLET | Freq: Four times a day (QID) | ORAL | Status: DC | PRN

## 2015-07-09 MED ORDER — SODIUM CHLORIDE 0.9% FLUSH
3.0000 mL | Freq: Two times a day (BID) | INTRAVENOUS | Status: DC
  Administered 2015-07-09 – 2015-07-19 (×15): 3 mL via INTRAVENOUS

## 2015-07-09 MED ORDER — FUROSEMIDE 20 MG PO TABS
20.0000 mg | ORAL_TABLET | Freq: Every day | ORAL | Status: DC
  Administered 2015-07-09: 20 mg via ORAL
  Filled 2015-07-09: qty 1

## 2015-07-09 MED ORDER — SODIUM CHLORIDE 0.9% FLUSH: 3.0000 mL | INTRAVENOUS | Status: DC | PRN

## 2015-07-09 MED ORDER — HYDROCODONE-ACETAMINOPHEN 5-325 MG PO TABS
1.0000 | ORAL_TABLET | Freq: Four times a day (QID) | ORAL | Status: DC | PRN
  Administered 2015-07-11 (×2): 1 via ORAL
  Filled 2015-07-09 (×2): qty 1

## 2015-07-09 MED ORDER — INSULIN GLARGINE 100 UNIT/ML ~~LOC~~ SOLN
10.0000 [IU] | Freq: Every morning | SUBCUTANEOUS | Status: DC
  Administered 2015-07-09 – 2015-07-12 (×4): 10 [IU] via SUBCUTANEOUS
  Filled 2015-07-09 (×5): qty 0.1

## 2015-07-09 MED ORDER — DORZOLAMIDE HCL-TIMOLOL MAL 2-0.5 % OP SOLN
1.0000 [drp] | Freq: Two times a day (BID) | OPHTHALMIC | Status: DC
  Administered 2015-07-09 – 2015-07-20 (×22): 1 [drp] via OPHTHALMIC
  Filled 2015-07-09: qty 10

## 2015-07-09 MED ORDER — ENOXAPARIN SODIUM 40 MG/0.4ML ~~LOC~~ SOLN
40.0000 mg | SUBCUTANEOUS | Status: DC
  Administered 2015-07-09 – 2015-07-11 (×3): 40 mg via SUBCUTANEOUS
  Filled 2015-07-09 (×3): qty 0.4

## 2015-07-09 MED ORDER — ASPIRIN EC 81 MG PO TBEC
81.0000 mg | DELAYED_RELEASE_TABLET | Freq: Every day | ORAL | Status: DC
  Administered 2015-07-09 – 2015-07-20 (×12): 81 mg via ORAL
  Filled 2015-07-09 (×12): qty 1

## 2015-07-09 MED ORDER — ACETAMINOPHEN 650 MG RE SUPP: 650.0000 mg | Freq: Four times a day (QID) | RECTAL | Status: DC | PRN

## 2015-07-09 NOTE — Progress Notes (Signed)
Family Medicine Teaching Service Daily Progress Note Intern Pager: [phone]  Patient name: [name] Medical record number: [id number] Date of birth: 1955/10/25 Age: 60 y.o. Gender: male  Primary Care Provider: Tawanna Sat, MD Consultants: none Code Status: Full   Pt Overview and Major Events to Date:  5/5: admitted for new onset HF   Assessment and Plan: [name] is a 60 y.o. male presenting with new onset CHF . PMH is significant for hypertension, schizophrenia, diabetes mellitus with peripheral neuropathy.   Cough/orthopnea/edema:  He's had 1300 output - start lasix 20 mg PO today   - Cards rec's  - Echocardiogram pending  - EKG am: sinus rhythm  - troponin's mildly elevated  - Begin ASA 81 mg daily  Scrotal/penile swelling: has improved slightly  - will continue to in/out cath and bladder scan as needed until he is able to void freely  Knee wounds and S/p R BKA. Patient has been crawling around on his knees at home to get around.  - Wound care consult - PT/OT  T2DM, uncontrolled: Last A1c 12.3 on 07/06/15. Home meds: Lantus 10 U every morning, Novolog 5 U TID before meals.  - Continue home meds - Moderate SSI - Continue to monitor CBGs.  HTN: Elevated BP of 162/121 on admission, no 147/101. 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 Pupil non-reactive and dilated. Followed by ophtho as an outpatient. - Continue home Brimodine, Cosopt, and visine drops in left eye.   FEN/GI: Heart health/carb modified, KVO  PPx: Sub Q heparin  Disposition: pending improvement   Subjective:  Feels improved with no complaints since admission   Objective: Temp:  [97.9 F (36.6 C)-98.3 F (36.8 C)] 98.2 F (36.8 C) (05/06 0550) Pulse Rate:  [72-103] 72 (05/06 0550) Resp:  [14-25] 20 (05/06 0550) BP:  (129-162)/(99-121) 129/99 mmHg (05/06 0550) SpO2:  [94 %-100 %] 94 % (05/06 0550) Weight:  [244 lb 7.8 oz (110.9 kg)] 244 lb 7.8 oz (110.9 kg) (05/06 0136) Physical Exam: General: NAD, sitting in bed  Cardiovascular: regular rhythm, S1S2, no murmurs  Respiratory: CTAB, no crackles or wheezes  Abdomen: soft, NT ND  Extremities: right BKA   Laboratory:  Recent Labs Lab 07/06/15 1056 07/08/15 1825  WBC 9.4 10.2  HGB 11.9* 12.1*  HCT 39.6 39.1  PLT 391 386    Recent Labs Lab 07/06/15 1056 07/08/15 1825 07/09/15 0407  NA 138 138 140  K 4.2 4.3 4.0  CL 100 101 102  CO2 31 28 28   BUN 19 19 17   CREATININE 1.03 1.15 0.99  CALCIUM 8.6 9.0 8.9  PROT  --  6.3*  --   BILITOT  --  1.0  --   ALKPHOS  --  137*  --   ALT  --  45  --   AST  --  36  --   GLUCOSE 313* 264* 221*    Imaging/Diagnostic Tests:   Rosemarie Ax, MD 07/09/2015, 9:00 AM PGY-3, [place] Intern pager: [phone], text pages welcome

## 2015-07-09 NOTE — Consult Note (Signed)
CARDIOLOGY CONSULT NOTE       Patient ID: [name] MRN: [id number] DOB/AGE: January 14, 1956 60 y.o.  Admit date: 07/08/2015 Referring Physician:  Nori Riis Primary Physician: Tawanna Sat, MD Primary Cardiologist:  New Reason for Consultation: CHF  Active Problems:   CHF (congestive heart failure) (HCC)   Dyspnea   Pressure ulcer   HPI:  60 y.o. HTN DM.  S/P right BKA.  Not clear what home situation is like but was crawling around on floor.  Has wheel chair but hard to use and prosthetic leg does not fit Due to edema.  Has not done well since BKA revision  in February with progressive fluid gain and abdominal, scrotal edema. No history of CAD, DCM No chest pain Urine has not been Checked for nephrosis. Rx with iv lasix started. Wound care seeing patient  Echo is pending.  Troponin mildly elevated at .31 with no acute ECG changes   ROS All other systems reviewed and negative except as noted above  Past Medical History  Diagnosis Date  . Diabetes mellitus   . Hypertension     Family History  Problem Relation Age of Onset  . Cancer Mother   . Peripheral vascular disease Father     Social History   Social History  . Marital Status: Married    Spouse Name: N/A  . Number of Children: N/A  . Years of Education: N/A   Occupational History  . Not on file.   Social History Main Topics  . Smoking status: Former Smoker    Quit date: 05/10/2000  . Smokeless tobacco: Former Systems developer    Quit date: 02/22/2000  . Alcohol Use: No  . Drug Use: No  . Sexual Activity: Not on file   Other Topics Concern  . Not on file   Social History Narrative   ** Merged History Encounter **       Lives alone.     Past Surgical History  Procedure Laterality Date  . I&d extremity Right 09/24/2013    Procedure: IRRIGATION AND DEBRIDEMENT RIGHT FOOT ULCER;  Surgeon: Renette Butters, MD;  Location: [place];  Service: Orthopedics;  Laterality: Right;  . I&d extremity Right 09/26/2013    Procedure:  Repeat IRRIGATION AND DEBRIDEMENT Right Foot Ulcer;  Surgeon: Rozanna Box, MD;  Location: [place];  Service: Orthopedics;  Laterality: Right;  . Amputation Right 09/26/2013    Procedure: AMPUTATION BELOW KNEE;  Surgeon: Rozanna Box, MD;  Location: [place];  Service: Orthopedics;  Laterality: Right;  . Tonsillectomy    . Colonoscopy    . Multiple tooth extractions    . Stump revision Right 04/20/2015    Procedure: Revision Right Below Knee Amputation, Apply Wound VAC;  Surgeon: Newt Minion, MD;  Location: [place];  Service: Orthopedics;  Laterality: Right;  . Amputation Right 05/01/2015    Procedure: REVISION OF RIGHT TRANSTIBIAL AMPUTATION ;  Surgeon: Newt Minion, MD;  Location: [place];  Service: Orthopedics;  Laterality: Right;     . aspirin EC  81 mg Oral Daily  . brimonidine  1 drop Left Eye BID  . dorzolamide-timolol  1 drop Left Eye BID  . enoxaparin (LOVENOX) injection  40 mg Subcutaneous Q24H  . insulin aspart  0-15 Units Subcutaneous TID WC  . insulin aspart  5 Units Subcutaneous TID WC  . insulin glargine  10 Units Subcutaneous q morning - 10a  . sodium chloride flush  3 mL Intravenous Q12H  .  tetrahydrozoline  1 drop Left Eye BID      Physical Exam: Blood pressure 129/99, pulse 72, temperature 98.2 F (36.8 C), temperature source Oral, resp. rate 20, height 6' (1.829 m), weight 110.9 kg (244 lb 7.8 oz), SpO2 94 %.   Affect appropriate Chronically ill black male  HEENT: normal Neck supple with no adenopathy JVP normal no bruits no thyromegaly Lungs clear with no wheezing and good diaphragmatic motion Heart:  S1/S2 no murmur, no rub, gallop or click PMI normal Abdomen: obese with anasarca and scrotal edema no bruit.  No HSM or HJR Right BKA with ulcers on both knees  Neuro non-focal Skin warm and dry No muscular weakness   Labs:   Lab Results  Component Value Date   WBC 10.2 07/08/2015   HGB 12.1* 07/08/2015   HCT 39.1 07/08/2015   MCV 76.1* 07/08/2015    PLT 386 07/08/2015    Recent Labs Lab 07/08/15 1825 07/09/15 0407  NA 138 140  K 4.3 4.0  CL 101 102  CO2 28 28  BUN 19 17  CREATININE 1.15 0.99  CALCIUM 9.0 8.9  PROT 6.3*  --   BILITOT 1.0  --   ALKPHOS 137*  --   ALT 45  --   AST 36  --   GLUCOSE 264* 221*   Lab Results  Component Value Date   TROPONINI 0.31* 07/09/2015    Lab Results  Component Value Date   CHOL 166 05/15/2013   CHOL 148 12/01/2008   CHOL 161 07/08/2007   Lab Results  Component Value Date   HDL 48 05/15/2013   HDL 34* 12/01/2008   HDL 43 07/08/2007   Lab Results  Component Value Date   LDLCALC 97 05/15/2013   LDLCALC 71 12/01/2008   LDLCALC 88 07/08/2007   Lab Results  Component Value Date   TRIG 106 05/15/2013   TRIG 215* 12/01/2008   TRIG 150* 07/08/2007   Lab Results  Component Value Date   CHOLHDL 3.5 05/15/2013   CHOLHDL 4.4 Ratio 12/01/2008   CHOLHDL 3.7 Ratio 07/08/2007   Lab Results  Component Value Date   LDLDIRECT 100* 04/07/2007   LDLDIRECT 78 01/07/2007      Radiology: Dg Chest 2 View  07/08/2015  CLINICAL DATA:  Swelling in the bilateral legs. Shortness of breath. EXAM: CHEST  2 VIEW COMPARISON:  August 11, 2004 FINDINGS: Marked cardiomegaly is identified. No pneumothorax. No overt edema. No pulmonary nodules, masses, or infiltrates. A layering effusion, probably on the right, is best appreciated on the lateral view. No other acute abnormalities. IMPRESSION: Cardiomegaly and right-sided pleural effusion.  No overt edema. Electronically Signed   By: Dorise Bullion III M.D   On: 07/08/2015 21:10   US Scrotum  07/08/2015  CLINICAL DATA:  60 year old male with scrotal pain and swelling. EXAM: SCROTAL ULTRASOUND DOPPLER ULTRASOUND OF THE TESTICLES TECHNIQUE: Complete ultrasound examination of the testicles, epididymis, and other scrotal structures was performed. Color and spectral Doppler ultrasound were also utilized to evaluate blood flow to the testicles. COMPARISON:  None.  FINDINGS: Right testicle Measurements: 4.4 x 3 x 3 cm.  No mass or microlithiasis visualized. Left testicle Measurements: 4 x 2.8 x 2.9 cm. No mass or microlithiasis visualized. Right epididymis:  A 5 mm cyst/spermatocele is noted. Left epididymis:  Normal in size and appearance. Hydrocele:  None visualized. Varicocele:  None visualized. Pulsed Doppler interrogation of both testes demonstrates normal low resistance arterial and venous waveforms bilaterally. Diffuse scrotal wall  edema and thickening noted. IMPRESSION: Diffuse scrotal wall edema. Normal testicles.  No evidence of testicular mass or torsion. Electronically Signed   By: Margarette Canada M.D.   On: 07/08/2015 20:00   [place] Art/ven Flow Abd Pelv Doppler  07/08/2015  CLINICAL DATA:  60 year old male with scrotal pain and swelling. EXAM: SCROTAL ULTRASOUND DOPPLER ULTRASOUND OF THE TESTICLES TECHNIQUE: Complete ultrasound examination of the testicles, epididymis, and other scrotal structures was performed. Color and spectral Doppler ultrasound were also utilized to evaluate blood flow to the testicles. COMPARISON:  None. FINDINGS: Right testicle Measurements: 4.4 x 3 x 3 cm.  No mass or microlithiasis visualized. Left testicle Measurements: 4 x 2.8 x 2.9 cm. No mass or microlithiasis visualized. Right epididymis:  A 5 mm cyst/spermatocele is noted. Left epididymis:  Normal in size and appearance. Hydrocele:  None visualized. Varicocele:  None visualized. Pulsed Doppler interrogation of both testes demonstrates normal low resistance arterial and venous waveforms bilaterally. Diffuse scrotal wall edema and thickening noted. IMPRESSION: Diffuse scrotal wall edema. Normal testicles.  No evidence of testicular mass or torsion. Electronically Signed   By: Margarette Canada M.D.   On: 07/08/2015 20:00    EKG: SR LAD lateral T wave changes    ASSESSMENT AND PLAN:  CHF:  Volume overload. Echo pending will see if systolic or diastolic failure continue iv lasix  Check  urine protein  Troponin : mildly elevated no chest pain lateral T wave changes.  If EF down by echo would consider myovue Wound Care:  Given diabetes consider at least oral antibiotic coverage will need social service consult  DM:  Discussed low carb diet.  Target hemoglobin A1c is 6.5 or less.  Continue current medications.   SignedJenkins Rouge 07/09/2015, 10:30 AM

## 2015-07-09 NOTE — Evaluation (Signed)
Physical Therapy Evaluation Patient Details Name: [name] MRN: [id number] DOB: 1955-04-02 Today's Date: 07/09/2015   History of Present Illness  Patient is a 60 yo male admitted 07/08/15 with swelling - new onset CHF, and wounds bil knees.  PMH:  HTN, schizophrenia, DM, peripheral neuropathy, Rt BKA, blind Lt eye    Clinical Impression  Patient presents with problems listed below.  Will benefit from acute PT to maximize functional mobility prior to discharge.  Patient was independent at w/c level pta.  Was also walking on knees for mobility - discouraged due to wounds on knees.  May need to use RW in bathroom - w/c does not fit.  Recommend HHPT at d/c for continued therapy.    Follow Up Recommendations Home health PT;Supervision - Intermittent    Equipment Recommendations  None recommended by PT    Recommendations for Other Services       Precautions / Restrictions Precautions Precautions: Fall Precaution Comments: Has Rt prosthesis that doesn't fit. Restrictions Weight Bearing Restrictions: No      Mobility  Bed Mobility Overal bed mobility: Modified Independent             General bed mobility comments: Increased time.  Patient repositioned himself at end of session for comfort.  Transfers Overall transfer level: Needs assistance Equipment used: None Transfers: Lateral/Scoot Transfers          Lateral/Scoot Transfers: Min guard General transfer comment: Patient able to demonstrate scooting transfers bed <> w/c with min guard assist for safety.  Difficulty with LE positioning and during transfer due to genital swelling.  Ambulation/Gait             General Gait Details: NT;  patient reports minimal use of RW.  Walks on knees.  Stairs            Wheelchair Mobility    Modified Rankin (Stroke Patients Only)       Balance   Sitting-balance support: No upper extremity supported Sitting balance-Leahy Scale: Good Sitting balance - Comments:  Good balance seated and during transfers                                     Pertinent Vitals/Pain Pain Assessment: 0-10 Pain Score: 5  Pain Location: Scrotum (During transfers to/from w/c) Pain Descriptors / Indicators: Sore;Tender Pain Intervention(s): Limited activity within patient's tolerance;Monitored during session;Repositioned    Home Living Family/patient expects to be discharged to:: Private residence Living Arrangements: Alone Available Help at Discharge: Family;Available PRN/intermittently (2 sisters) Type of Home: Apartment Home Access: Level entry     Home Layout: One level Home Equipment: Walker - 2 wheels;Bedside commode;Wheelchair - manual;Shower seat Additional Comments: sister helps drive him to appointments and to the grocery store    Prior Function Level of Independence: Independent with assistive device(s)         Comments: Patient unable to use prosthesis - doesn't fit.  Uses w/c, or walks on his knees for mobility.     Hand Dominance   Dominant Hand: Right    Extremity/Trunk Assessment   Upper Extremity Assessment: Overall WFL for tasks assessed           Lower Extremity Assessment: RLE deficits/detail RLE Deficits / Details: BKA       Communication   Communication: No difficulties  Cognition Arousal/Alertness: Awake/alert Behavior During Therapy: WFL for tasks assessed/performed Overall Cognitive Status: Within Functional Limits for  tasks assessed                      General Comments      Exercises        Assessment/Plan    PT Assessment Patient needs continued PT services  PT Diagnosis Difficulty walking;Acute pain   PT Problem List Decreased activity tolerance;Decreased mobility;Decreased knowledge of use of DME;Obesity;Pain;Decreased skin integrity  PT Treatment Interventions DME instruction;Gait training;Functional mobility training;Therapeutic activities;Patient/family education;Wheelchair  mobility training   PT Goals (Current goals can be found in the Care Plan section) Acute Rehab PT Goals Patient Stated Goal: To return home PT Goal Formulation: With patient Time For Goal Achievement: 07/16/15 Potential to Achieve Goals: Good    Frequency Min 3X/week   Barriers to discharge Decreased caregiver support Patient lives alone    Co-evaluation               End of Session   Activity Tolerance: Patient limited by pain Patient left: in bed;with call bell/phone within reach Nurse Communication: Mobility status         Time: [id number] PT Time Calculation (min) (ACUTE ONLY): 17 min   Charges:   PT Evaluation $PT Eval Moderate Complexity: 1 Procedure     PT G CodesDespina Pole Jul 21, 2015, 5:14 PM [name]. [name], [place] Pager [phone]

## 2015-07-10 ENCOUNTER — Inpatient Hospital Stay (HOSPITAL_COMMUNITY): Payer: Medicaid Other

## 2015-07-10 DIAGNOSIS — R0600 Dyspnea, unspecified: Secondary | ICD-10-CM

## 2015-07-10 LAB — BASIC METABOLIC PANEL
Anion gap: 8 (ref 5–15)
BUN: 21 mg/dL — ABNORMAL HIGH (ref 6–20)
CO2: 30 mmol/L (ref 22–32)
Calcium: 8.6 mg/dL — ABNORMAL LOW (ref 8.9–10.3)
Chloride: 103 mmol/L (ref 101–111)
Creatinine, Ser: 1.18 mg/dL (ref 0.61–1.24)
GFR calc Af Amer: >60 mL/min (ref >60)
GFR calc non Af Amer: >60 mL/min (ref >60)
Glucose, Bld: 95 mg/dL (ref 65–99)
Potassium: 3.8 mmol/L (ref 3.5–5.1)
Sodium: 141 mmol/L (ref 135–145)

## 2015-07-10 LAB — GLUCOSE, CAPILLARY
Glucose-Capillary: 91 mg/dL (ref 65–99)
Glucose-Capillary: 89 mg/dL (ref 65–99)
Glucose-Capillary: 107 mg/dL — ABNORMAL HIGH (ref 65–99)
Glucose-Capillary: 153 mg/dL — ABNORMAL HIGH (ref 65–99)

## 2015-07-10 LAB — ECHOCARDIOGRAM COMPLETE
Height: 72 in
Weight: 3806.02 oz

## 2015-07-10 LAB — LIPID PANEL
Cholesterol: 96 mg/dL (ref 0–200)
HDL: 32 mg/dL — ABNORMAL LOW (ref >40)
LDL Cholesterol: 55 mg/dL (ref 0–99)
Total CHOL/HDL Ratio: 3 RATIO
Triglycerides: 47 mg/dL (ref <150)
VLDL: 9 mg/dL (ref 0–40)

## 2015-07-10 LAB — MAGNESIUM: Magnesium: 1.7 mg/dL (ref 1.7–2.4)

## 2015-07-10 LAB — TSH: TSH: 0.952 u[IU]/mL (ref 0.350–4.500)

## 2015-07-10 MED ORDER — POTASSIUM CHLORIDE CRYS ER 20 MEQ PO TBCR
20.0000 meq | EXTENDED_RELEASE_TABLET | Freq: Once | ORAL | Status: AC
  Administered 2015-07-10: 20 meq via ORAL
  Filled 2015-07-10: qty 1

## 2015-07-10 MED ORDER — FUROSEMIDE 10 MG/ML IJ SOLN
40.0000 mg | Freq: Two times a day (BID) | INTRAMUSCULAR | Status: DC
  Administered 2015-07-10 – 2015-07-11 (×2): 40 mg via INTRAVENOUS
  Filled 2015-07-10 (×2): qty 4

## 2015-07-10 MED ORDER — LISINOPRIL 5 MG PO TABS
5.0000 mg | ORAL_TABLET | Freq: Every day | ORAL | Status: DC
  Administered 2015-07-10 – 2015-07-11 (×2): 5 mg via ORAL
  Filled 2015-07-10 (×3): qty 1

## 2015-07-10 MED ORDER — POTASSIUM CHLORIDE CRYS ER 20 MEQ PO TBCR: 20.0000 meq | EXTENDED_RELEASE_TABLET | Freq: Two times a day (BID) | ORAL | Status: DC

## 2015-07-10 MED ORDER — FUROSEMIDE 10 MG/ML IJ SOLN: 80.0000 mg | Freq: Two times a day (BID) | INTRAMUSCULAR | Status: DC

## 2015-07-10 MED ORDER — FUROSEMIDE 10 MG/ML IJ SOLN: 40.0000 mg | Freq: Two times a day (BID) | INTRAMUSCULAR | Status: DC

## 2015-07-10 MED ORDER — INSULIN ASPART 100 UNIT/ML ~~LOC~~ SOLN
0.0000 [IU] | Freq: Three times a day (TID) | SUBCUTANEOUS | Status: DC
  Administered 2015-07-12 – 2015-07-14 (×3): 3 [IU] via SUBCUTANEOUS
  Administered 2015-07-14: 1 [IU] via SUBCUTANEOUS
  Administered 2015-07-14: 2 [IU] via SUBCUTANEOUS
  Administered 2015-07-15: 5 [IU] via SUBCUTANEOUS
  Administered 2015-07-15: 1 [IU] via SUBCUTANEOUS

## 2015-07-10 NOTE — Progress Notes (Signed)
Family Medicine Teaching Service Daily Progress Note Intern Pager: [phone]  Patient name: [name] Medical record number: [id number] Date of birth: January 12, 1956 Age: 60 y.o. Gender: male  Primary Care Provider: Tawanna Sat, MD Consultants: none Code Status: Full   Pt Overview and Major Events to Date:  5/5: admitted for new onset HF   Assessment and Plan: MARISA LATHE is a 60 y.o. male presenting with new onset CHF . PMH is significant for hypertension, schizophrenia, diabetes mellitus with peripheral neuropathy.   CHF:  650cc out. Anasarca. Pending echo for cardiac function Diastolic vs. Systolic. High risk for CAD.  - Cards rec's appreciated - IV Lasix. 40 BID today.  - Echocardiogram pending  - Begin ASA 81 mg daily - Risk Strat with Lipids, TSH, A1C 12.3  - ASCVD 10 year risk likely > 7.5% , would start Statin.   Scrotal/penile swelling: little improvement. Difficulty urinating.  - Bladder scanning. No discomfort though.  - I/O cath if needed.  - If urinary retention with high output 2/2 lasix low threshold for foley.   Knee wounds and S/p R BKA. Patient has been crawling around on his knees at home to get around. Not infected.  Not painful.  - Wound care consult pending.  - Amputation and previous surgical care with ortho.  - HH/ Care Management at d/c to make sure he has appropriate help and ambulatory devices at home.   T2DM, uncontrolled: Last A1c 12.3 on 07/06/15. Home meds: Lantus 10 U every morning, Novolog 5 U TID before meals. Hypoglycemic overnight.  - Continue home meds - Sensitive SSI changed from Moderate due to hypoglycemia.  - Continue to monitor CBGs.  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 Mild.  - ACE I started.  - Follow as outpatient. / Workup as indicated.   HTN: Elevated BP of 162/121 on admission, no 147/101. Antihypertensives stopped while in hospital 2 months ago (Losartan-HCTZ.) - Needs ACEi with DMII.  - BP's mildly elevated.    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 Pupil non-reactive and dilated. Followed by ophtho as an outpatient. - Continue home Brimodine, Cosopt, and visine drops in left eye.   FEN/GI: Heart health/carb modified, KVO  PPx: Sub Q heparin  Disposition: pending improvement   Subjective:  Feels that he is about the same. Continues with significant scrotal edema. No other complaints. No fever, chills, palpitations, SOB. Denies symptomatic hypoglycemia overnight.   Objective: Temp:  [98.2 F (36.8 C)-98.4 F (36.9 C)] 98.3 F (36.8 C) (05/07 0445) Pulse Rate:  [79-98] 98 (05/07 0445) Resp:  [18-20] 18 (05/07 0445) BP: (131-146)/(86-108) 146/93 mmHg (05/07 0445) SpO2:  [94 %-97 %] 97 % (05/07 0445) Weight:  [237 lb 14 oz (107.9 kg)] 237 lb 14 oz (107.9 kg) (05/07 0445) Physical Exam: General: NAD, sitting in bed watching tv.  Cardiovascular: regular rhythm, S1S2, no murmurs  Respiratory: CTAB, crackles in BL bases.  Abdomen: soft, NT ND, + abdominal edema.  Extremities: right BKA, 3+ edema of both extremities and in his stump. Ulcerations noted on both knees without erythema, and base covered with granulation tissue. No visible bone. Stage 2.  G/U: Scrotal and Penile edema with significant swelling. Penile head / meatus not visible.   Laboratory:  Recent Labs Lab 07/06/15 1056 07/08/15 1825  WBC 9.4 10.2  HGB 11.9* 12.1*  HCT 39.6 39.1  PLT 391 386    Recent Labs Lab 07/08/15 1825 07/09/15 0407 07/10/15 0352  NA  138 140 141  K 4.3 4.0 3.8  CL 101 102 103  CO2 28 28 30   BUN 19 17 21*  CREATININE 1.15 0.99 1.18  CALCIUM 9.0 8.9 8.6*  PROT 6.3*  --   --   BILITOT 1.0  --   --   ALKPHOS 137*  --   --   ALT 45  --   --   AST 36  --   --   GLUCOSE 264* 221* 95    Imaging/Diagnostic Tests:   Aquilla Hacker, MD 07/10/2015, 9:22 AM PGY-2 [place] Intern pager: [phone], text pages welcome

## 2015-07-10 NOTE — Progress Notes (Signed)
Patient ID: [name], male   DOB: 21-Feb-1956, 60 y.o.   MRN: [id number]    Subjective:  Some dyspnea no real diuresis on oral lasix   Objective:  Filed Vitals:   07/09/15 1200 07/09/15 1417 07/09/15 2029 07/10/15 0445  BP: 135/108 133/94 131/86 146/93  Pulse: 79 84 87 98  Temp: 98.4 F (36.9 C)  98.2 F (36.8 C) 98.3 F (36.8 C)  TempSrc: Oral  Oral Oral  Resp: 20 20 18 18   Height:      Weight:    107.9 kg (237 lb 14 oz)  SpO2: 94%  94% 97%    Intake/Output from previous day:  Intake/Output Summary (Last 24 hours) at 07/10/15 [id number] Last data filed at 07/10/15 0600  Gross per 24 hour  Intake   1200 ml  Output    652 ml  Net    548 ml    Physical Exam: Affect appropriate Chronically ill black male  HEENT: normal Neck supple with no adenopathy JVP normal no bruits no thyromegaly Lungs clear with no wheezing and good diaphragmatic motion Heart: S1/S2 no murmur, no rub, gallop or click PMI normal Abdomen: obese with anasarca and scrotal edema no bruit. No HSM or HJR Right BKA with ulcers on both knees  Neuro non-focal Skin warm and dry No muscular weakness  Lab Results: Basic Metabolic Panel:  Recent Labs  07/09/15 0407 07/10/15 0352  NA 140 141  K 4.0 3.8  CL 102 103  CO2 28 30  GLUCOSE 221* 95  BUN 17 21*  CREATININE 0.99 1.18  CALCIUM 8.9 8.6*   Liver Function Tests:  Recent Labs  07/08/15 1825  AST 36  ALT 45  ALKPHOS 137*  BILITOT 1.0  PROT 6.3*  ALBUMIN 2.7*   CBC:  Recent Labs  07/08/15 1825  WBC 10.2  NEUTROABS 8.1*  HGB 12.1*  HCT 39.1  MCV 76.1*  PLT 386   Cardiac Enzymes:  Recent Labs  07/09/15 0402 07/09/15 0753 07/09/15 1243  TROPONINI 0.32* 0.31* 0.26*    Imaging: Dg Chest 2 View  07/08/2015  CLINICAL DATA:  Swelling in the bilateral legs. Shortness of breath. EXAM: CHEST  2 VIEW COMPARISON:  August 11, 2004 FINDINGS: Marked cardiomegaly is identified. No pneumothorax. No overt edema. No pulmonary  nodules, masses, or infiltrates. A layering effusion, probably on the right, is best appreciated on the lateral view. No other acute abnormalities. IMPRESSION: Cardiomegaly and right-sided pleural effusion.  No overt edema. Electronically Signed   By: [name] M.D   On: 07/08/2015 21:10   US Scrotum  07/08/2015  CLINICAL DATA:  60 year old male with scrotal pain and swelling. EXAM: SCROTAL ULTRASOUND DOPPLER ULTRASOUND OF THE TESTICLES TECHNIQUE: Complete ultrasound examination of the testicles, epididymis, and other scrotal structures was performed. Color and spectral Doppler ultrasound were also utilized to evaluate blood flow to the testicles. COMPARISON:  None. FINDINGS: Right testicle Measurements: 4.4 x 3 x 3 cm.  No mass or microlithiasis visualized. Left testicle Measurements: 4 x 2.8 x 2.9 cm. No mass or microlithiasis visualized. Right epididymis:  A 5 mm cyst/spermatocele is noted. Left epididymis:  Normal in size and appearance. Hydrocele:  None visualized. Varicocele:  None visualized. Pulsed Doppler interrogation of both testes demonstrates normal low resistance arterial and venous waveforms bilaterally. Diffuse scrotal wall edema and thickening noted. IMPRESSION: Diffuse scrotal wall edema. Normal testicles.  No evidence of testicular mass or torsion. Electronically Signed   By: [name].D.  On: 07/08/2015 20:00   [place] Art/ven Flow Abd Pelv Doppler  07/08/2015  CLINICAL DATA:  60 year old male with scrotal pain and swelling. EXAM: SCROTAL ULTRASOUND DOPPLER ULTRASOUND OF THE TESTICLES TECHNIQUE: Complete ultrasound examination of the testicles, epididymis, and other scrotal structures was performed. Color and spectral Doppler ultrasound were also utilized to evaluate blood flow to the testicles. COMPARISON:  None. FINDINGS: Right testicle Measurements: 4.4 x 3 x 3 cm.  No mass or microlithiasis visualized. Left testicle Measurements: 4 x 2.8 x 2.9 cm. No mass or microlithiasis  visualized. Right epididymis:  A 5 mm cyst/spermatocele is noted. Left epididymis:  Normal in size and appearance. Hydrocele:  None visualized. Varicocele:  None visualized. Pulsed Doppler interrogation of both testes demonstrates normal low resistance arterial and venous waveforms bilaterally. Diffuse scrotal wall edema and thickening noted. IMPRESSION: Diffuse scrotal wall edema. Normal testicles.  No evidence of testicular mass or torsion. Electronically Signed   By: [name] M.D.   On: 07/08/2015 20:00    Cardiac Studies:  ECG:  SR LAD lateral T wave changes    Telemetry:  NSR no arrhythmia 07/10/2015   Echo: pending   Medications:   . aspirin EC  81 mg Oral Daily  . brimonidine  1 drop Left Eye BID  . dorzolamide-timolol  1 drop Left Eye BID  . enoxaparin (LOVENOX) injection  40 mg Subcutaneous Q24H  . furosemide  20 mg Oral Daily  . insulin aspart  0-9 Units Subcutaneous TID WC  . insulin aspart  5 Units Subcutaneous TID WC  . insulin glargine  10 Units Subcutaneous q morning - 10a  . sodium chloride flush  3 mL Intravenous Q12H  . tetrahydrozoline  1 drop Left Eye BID       Assessment/Plan:  CHF: Volume overload. Echo pending will see if systolic or diastolic failure Change to bid iv lasix  Check urine protein  Troponin : mildly elevated no chest pain lateral T wave changes. If EF down by echo would consider myovue Wound Care: Given diabetes consider at least oral antibiotic coverage will need social service consult  DM: Discussed low carb diet. Target hemoglobin A1c is 6.5 or less. Continue current medications.  [name] 07/10/2015, 8:12 AM

## 2015-07-10 NOTE — Progress Notes (Signed)
OT Cancellation Note  Patient Details Name: BRYENT COLLMAN MRN: [id number] DOB: Aug 18, 1955   Cancelled Treatment:    Reason Eval/Treat Not Completed: Patient at procedure or test/ unavailable.Will reattempt OT eval as schedule permits.   Hortencia Pilar 07/10/2015, 12:00 PM

## 2015-07-10 NOTE — Progress Notes (Signed)
  Echocardiogram 2D Echocardiogram has been performed.  Johny Chess 07/10/2015, 12:14 PM

## 2015-07-10 NOTE — Progress Notes (Signed)
Occupational Therapy Evaluation Patient Details Name: [name] MRN: [id number] DOB: [birth year] Today's Date: 07/10/2015    History of Present Illness Patient is a 60 yo male admitted 07/08/15 with swelling - new onset CHF, and wounds bil knees.  PMH:  HTN, schizophrenia, DM, peripheral neuropathy, Rt BKA, blind Lt eye   Clinical Impression   Pt admitted with the above diagnoses and presents with below problem list. Pt will benefit from continued acute OT to address the below listed deficits and maximize independence with BADLs prior to d/c to venue below. PTA pt was mod I with ADLs. Of note, he was crawling to mobilize at home with skin wounds to both knees observed. Pt is currently min guard for lateral scoot transfers, min to mod A with SPT. Increased scrotal pain with transfers. Will follow.     Follow Up Recommendations  Home health OT;Supervision - Intermittent    Equipment Recommendations  None recommended by OT    Recommendations for Other Services       Precautions / Restrictions Precautions Precautions: Fall Precaution Comments: Has Rt prosthesis that doesn't fit. Restrictions Weight Bearing Restrictions: No      Mobility Bed Mobility Overal bed mobility: Modified Independent             General bed mobility comments: Increased time.  Patient repositioned himself at end of session for comfort.  Transfers Overall transfer level: Needs assistance Equipment used: Rolling walker (2 wheeled);None Transfers: Sit to/from [facility];Lateral/Scoot Transfers Sit to Stand: Min assist Stand pivot transfers: Mod assist      Lateral/Scoot Transfers: Min guard General transfer comment: Completed lateral scoot and SPT transfers during session. Scrotal pain and swelling impacting transfer.    Balance Overall balance assessment: Needs assistance Sitting-balance support: No upper extremity supported Sitting balance-Leahy Scale: Good     Standing  balance support: Bilateral upper extremity supported Standing balance-Leahy Scale: Poor                              ADL Overall ADL's : Needs assistance/impaired Eating/Feeding: Set up;Sitting   Grooming: Set up;Sitting   Upper Body Bathing: Set up;Sitting   Lower Body Bathing: Sitting/lateral leans;Min guard   Upper Body Dressing : Set up;Sitting   Lower Body Dressing: Min guard;Sitting/lateral leans   Toilet Transfer: Minimal assistance;Min guard Toilet Transfer Details (indicate cue type and reason): completed as lateral scoot from w/c to commode with heavy use of grab bars Toileting- Clothing Manipulation and Hygiene: Supervision/safety;Sitting/lateral lean;Set up   Tub/ Shower Transfer: Minimal assistance;Moderate assistance;Stand-pivot;Rolling walker;Shower Scientist, research (medical) Details (indicate cue type and reason): simulated with SPT from w/c to EOB; min guard if completed as lateral scoot.    General ADL Comments: Pt completed SPT EOB<>w/c min to mod A with rw used. Pt reports occasional use of rw at home. Lateral scoot transfer w/c <> commode, heavy use of grab bars. Increased scrotal pain with transfers.      Vision     Perception     Praxis      Pertinent Vitals/Pain Pain Assessment: 0-10 Pain Score: 5  Pain Location: Scrotom (during transfers) Pain Descriptors / Indicators: Grimacing;Moaning;Sore;Tender Pain Intervention(s): Limited activity within patient's tolerance;Monitored during session;Repositioned     Hand Dominance Right   Extremity/Trunk Assessment Upper Extremity Assessment Upper Extremity Assessment: Overall WFL for tasks assessed   Lower Extremity Assessment Lower Extremity Assessment: Defer to PT evaluation;RLE deficits/detail RLE Deficits / Details:  BKA       Communication Communication Communication: No difficulties   Cognition Arousal/Alertness: Awake/alert Behavior During Therapy: WFL for tasks  assessed/performed Overall Cognitive Status: Within Functional Limits for tasks assessed                     General Comments       Exercises       Shoulder Instructions      Home Living Family/patient expects to be discharged to:: Private residence Living Arrangements: Alone Available Help at Discharge: Family;Available PRN/intermittently (2 sisters) Type of Home: Apartment Home Access: Level entry     Home Layout: One level     Bathroom Shower/Tub: Teacher, early years/pre: Standard     Home Equipment: Environmental consultant - 2 wheels;Bedside commode;Wheelchair - manual;Shower seat   Additional Comments: sister helps drive him to appointments and to the grocery store      Prior Functioning/Environment Level of Independence: Independent with assistive device(s)        Comments: Patient unable to use prosthesis - doesn't fit.  Uses w/c, or walks on his knees for mobility.    OT Diagnosis: Acute pain   OT Problem List: Impaired balance (sitting and/or standing);Decreased knowledge of use of DME or AE;Decreased knowledge of precautions;Decreased safety awareness;Pain;Increased edema;Other (comment) (scrotal edema)   OT Treatment/Interventions: Self-care/ADL training;DME and/or AE instruction;Energy conservation;Therapeutic activities;Patient/family education;Balance training    OT Goals(Current goals can be found in the care plan section) Acute Rehab OT Goals Patient Stated Goal: To return home OT Goal Formulation: With patient Time For Goal Achievement: 07/24/15 Potential to Achieve Goals: Good ADL Goals Pt Will Perform Lower Body Bathing: with modified independence;sitting/lateral leans;with adaptive equipment Pt Will Perform Lower Body Dressing: with modified independence;with adaptive equipment;sitting/lateral leans Pt Will Transfer to Toilet: with modified independence (lateral ) Pt Will Perform Toileting - Clothing Manipulation and hygiene:  sitting/lateral leans;with modified independence Pt Will Perform Tub/Shower Transfer: with modified independence;shower seat (lateral scoot)  OT Frequency: Min 2X/week   Barriers to D/C: Decreased caregiver support  lives alone       Co-evaluation              End of Session Equipment Utilized During Treatment: Gait belt;Rolling walker;Other (comment) (w/c) Nurse Communication: Other (comment);Mobility status (NT present for most of session.)  Activity Tolerance: Patient tolerated treatment well;Patient limited by pain Patient left: in bed;with call bell/phone within reach;with chair alarm set   Time: 1353-1419 OT Time Calculation (min): 26 min Charges:  OT General Charges $OT Visit: 1 Procedure OT Evaluation $OT Eval Low Complexity: 1 Procedure OT Treatments $Self Care/Home Management : 8-22 mins G-Codes:    Hortencia Pilar 2015-07-30, 2:39 PM

## 2015-07-11 ENCOUNTER — Encounter (HOSPITAL_COMMUNITY): Payer: Self-pay | Admitting: Cardiology

## 2015-07-11 DIAGNOSIS — I420 Dilated cardiomyopathy: Secondary | ICD-10-CM

## 2015-07-11 DIAGNOSIS — I5021 Acute systolic (congestive) heart failure: Secondary | ICD-10-CM | POA: Diagnosis present

## 2015-07-11 DIAGNOSIS — I10 Essential (primary) hypertension: Secondary | ICD-10-CM

## 2015-07-11 HISTORY — DX: Dilated cardiomyopathy: I42.0

## 2015-07-11 LAB — CBC
HCT: 38.2 % — ABNORMAL LOW (ref 39.0–52.0)
Hemoglobin: 12.1 g/dL — ABNORMAL LOW (ref 13.0–17.0)
MCH: 23.5 pg — ABNORMAL LOW (ref 26.0–34.0)
MCHC: 31.7 g/dL (ref 30.0–36.0)
MCV: 74.3 fL — ABNORMAL LOW (ref 78.0–100.0)
Platelets: 353 10*3/uL (ref 150–400)
RBC: 5.14 MIL/uL (ref 4.22–5.81)
RDW: 15.8 % — ABNORMAL HIGH (ref 11.5–15.5)
WBC: 10.3 10*3/uL (ref 4.0–10.5)

## 2015-07-11 LAB — BASIC METABOLIC PANEL
Anion gap: 11 (ref 5–15)
BUN: 20 mg/dL (ref 6–20)
CO2: 30 mmol/L (ref 22–32)
Calcium: 8.7 mg/dL — ABNORMAL LOW (ref 8.9–10.3)
Chloride: 101 mmol/L (ref 101–111)
Creatinine, Ser: 1.2 mg/dL (ref 0.61–1.24)
GFR calc Af Amer: >60 mL/min (ref >60)
GFR calc non Af Amer: >60 mL/min (ref >60)
Glucose, Bld: 112 mg/dL — ABNORMAL HIGH (ref 65–99)
Potassium: 3.7 mmol/L (ref 3.5–5.1)
Sodium: 142 mmol/L (ref 135–145)

## 2015-07-11 LAB — GLUCOSE, CAPILLARY
Glucose-Capillary: 222 mg/dL — ABNORMAL HIGH (ref 65–99)
Glucose-Capillary: 113 mg/dL — ABNORMAL HIGH (ref 65–99)
Glucose-Capillary: 65 mg/dL (ref 65–99)
Glucose-Capillary: 94 mg/dL (ref 65–99)
Glucose-Capillary: 154 mg/dL — ABNORMAL HIGH (ref 65–99)

## 2015-07-11 LAB — MAGNESIUM: Magnesium: 1.6 mg/dL — ABNORMAL LOW (ref 1.7–2.4)

## 2015-07-11 MED ORDER — MAGNESIUM CITRATE PO SOLN
1.0000 | Freq: Once | ORAL | Status: AC
  Administered 2015-07-11: 1 via ORAL
  Filled 2015-07-11: qty 296

## 2015-07-11 MED ORDER — ATORVASTATIN CALCIUM 20 MG PO TABS
20.0000 mg | ORAL_TABLET | Freq: Every day | ORAL | Status: DC
  Administered 2015-07-11: 20 mg via ORAL
  Filled 2015-07-11: qty 1

## 2015-07-11 MED ORDER — POLYETHYLENE GLYCOL 3350 17 G PO PACK
17.0000 g | PACK | Freq: Once | ORAL | Status: AC
  Administered 2015-07-11: 17 g via ORAL
  Filled 2015-07-11: qty 1

## 2015-07-11 MED ORDER — FUROSEMIDE 10 MG/ML IJ SOLN
80.0000 mg | Freq: Two times a day (BID) | INTRAMUSCULAR | Status: DC
  Administered 2015-07-11 – 2015-07-19 (×16): 80 mg via INTRAVENOUS
  Filled 2015-07-11 (×16): qty 8

## 2015-07-11 MED ORDER — SPIRONOLACTONE 25 MG PO TABS
12.5000 mg | ORAL_TABLET | Freq: Every day | ORAL | Status: DC
  Administered 2015-07-11 – 2015-07-18 (×8): 12.5 mg via ORAL
  Filled 2015-07-11 (×8): qty 1

## 2015-07-11 MED ORDER — CARVEDILOL 12.5 MG PO TABS
12.5000 mg | ORAL_TABLET | Freq: Two times a day (BID) | ORAL | Status: DC
  Administered 2015-07-11 – 2015-07-12 (×2): 12.5 mg via ORAL
  Filled 2015-07-11 (×2): qty 1

## 2015-07-11 MED ORDER — CARVEDILOL 3.125 MG PO TABS: 3.1250 mg | ORAL_TABLET | Freq: Two times a day (BID) | ORAL | Status: DC

## 2015-07-11 MED ORDER — HEPARIN SODIUM (PORCINE) 5000 UNIT/ML IJ SOLN
5000.0000 [IU] | Freq: Three times a day (TID) | INTRAMUSCULAR | Status: DC
  Administered 2015-07-12 – 2015-07-20 (×22): 5000 [IU] via SUBCUTANEOUS
  Filled 2015-07-11 (×24): qty 1

## 2015-07-11 NOTE — Progress Notes (Signed)
Physical Therapy Treatment Patient Details Name: [name] MRN: [id number] DOB: December 19, 1955 Today's Date: 07/11/2015    History of Present Illness Patient is a 60 yo male admitted 07/08/15 with swelling - new onset CHF, and wounds bil knees.  PMH:  HTN, schizophrenia, DM, peripheral neuropathy, Rt BKA, blind Lt eye    PT Comments    [name] demonstrated understanding of proper use of sliding board w/ min guard assist for safety.  Pt will benefit from continued skilled PT services to increase functional independence and safety.  Follow Up Recommendations  Home health PT;Supervision - Intermittent     Equipment Recommendations  Other (comment) (sliding board)    Recommendations for Other Services       Precautions / Restrictions Precautions Precautions: Fall Precaution Comments: Has Rt prosthesis that doesn't fit. Restrictions Weight Bearing Restrictions: No    Mobility  Bed Mobility Overal bed mobility: Modified Independent             General bed mobility comments: Increased time.  Patient repositioned himself at end of session for comfort.  Transfers Overall transfer level: Needs assistance Equipment used:  (sliding board) Transfers: Lateral/Scoot Transfers          Lateral/Scoot Transfers: Min guard General transfer comment: Demonstration and cues for proper placement and use of sliding board.  Pt takes short breaks as needed due to pain from scrotal edema.  Pt transfered to and from [name], demonstrating understanding of use of sliding board.  Ambulation/Gait             General Gait Details: NT;  patient reports minimal use of RW.  Walks on knees.   Stairs            Wheelchair Mobility    Modified Rankin (Stroke Patients Only)       Balance Overall balance assessment: Needs assistance Sitting-balance support: Feet supported;Single extremity supported Sitting balance-Leahy Scale: Good Sitting balance - Comments: Good balance seated  and during transfers                            Cognition Arousal/Alertness: Awake/alert Behavior During Therapy: WFL for tasks assessed/performed Overall Cognitive Status: Within Functional Limits for tasks assessed                      Exercises      General Comments        Pertinent Vitals/Pain Pain Assessment: 0-10 Pain Score: 6  Pain Location: scrotum Pain Descriptors / Indicators: Discomfort;Grimacing;Moaning Pain Intervention(s): Limited activity within patient's tolerance;Monitored during session    Home Living                      Prior Function            PT Goals (current goals can now be found in the care plan section) Acute Rehab PT Goals Patient Stated Goal: none stated PT Goal Formulation: With patient Time For Goal Achievement: 07/16/15 Potential to Achieve Goals: Good Progress towards PT goals: Progressing toward goals    Frequency  Min 3X/week    PT Plan Equipment recommendations need to be updated    Co-evaluation             End of Session   Activity Tolerance: Patient limited by pain Patient left: in bed;with call bell/phone within reach;with bed alarm set     Time: [id number] PT Time Calculation (min) (ACUTE ONLY):  20 min  Charges:  $Therapeutic Activity: 8-22 mins                    G Codes:      Collie Siad PT, DPT  Pager: [phone] Phone: [phone] 07/11/2015, 3:29 PM

## 2015-07-11 NOTE — Progress Notes (Signed)
Inpatient Diabetes Program Recommendations  AACE/ADA: New Consensus Statement on Inpatient Glycemic Control (2015)  Target Ranges:  Prepandial:   less than 140 mg/dL      Peak postprandial:   less than 180 mg/dL (1-2 hours)      Critically ill patients:  140 - 180 mg/dL   Review of Glycemic Control  Inpatient Diabetes Program Recommendations:  Insulin - Meal Coverage: consider decreasing Novolog 5 units with meals to 3 units per the Glycemic Control order-set Hypoglycemia after meal coverage was given.  Parameters are included in the order-set ie. Do not give if eats <50% of meals.   Thank you  Raoul Pitch BSN, RN,CDE Inpatient Diabetes Coordinator [phone] (team pager)

## 2015-07-11 NOTE — Consult Note (Signed)
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 Sharol Given.  This incision has healed, however now this patient has areas of trauma to both knees.  Patient reports he "crawls" on his knees in his home.   Wound type:trauma  Pressure Ulcer POA:No, feel that these are related to trauma not pressure  Measurement:  left knee: 1cm x 1cm x 0.2cm; right knee 3cm x 4cm x 0.2cm  Wound [id number] knee, dry stable, not eschar; right knee 100% eschar, dirty with hair/lint embedded in the wound, not fluctuant  Drainage (amount, consistency, odor) none, no dressings in place at the time of my assessment Periwound: darkened tissue, some hardness but most likely due to chronic trauma to the area Dressing procedure/placement/frequency: Contacted Dr. Sharol Given, since he as most recently followed patient for amputation.  We are both in agreement that since this patient has a history of non healing wounds and he will continue to walk/crawl on his knees that leaving the eschar intact unless this changes, becomes fluctuant or draining.  Explained rationale to the patient and suggested per Dr. Sharol Given that patient obtain some knee pads to protect the areas.   Explained to patient if the areas change and start to be open and draining he would need to follow up with Dr. Sharol Given.  Discussed POC with patient and bedside nurse.  Re consult if needed, will not follow at this time. Thanks  Melody Kellogg, [place] [phone])

## 2015-07-11 NOTE — Progress Notes (Addendum)
Patient Name: [name] Date of Encounter: 07/11/2015  Active Problems:   CHF (congestive heart failure) (HCC)   Dyspnea   Pressure ulcer   Elevated troponin   Scrotal swelling   Primary Cardiologist: New Patient Profile: [name] is a 60 year old male with a past medical history of HTN, DM. S/p right BKA. Presented to the ED with scrotal swelling and lower extremity edema. BNP was 1764, troponin was 0.21. Echo shows EF of 15%.   SUBJECTIVE: Feels ok, still having frequent cough.    OBJECTIVE Filed Vitals:   07/10/15 1300 07/10/15 2105 07/11/15 0416 07/11/15 1013  BP: 150/96 155/99 149/105 136/96  Pulse: 84 86 88 89  Temp: 98.1 F (36.7 C) 97.3 F (36.3 C) 98.5 F (36.9 C)   TempSrc: Oral Oral Oral   Resp: 20 18 20 18   Height:      Weight:   225 lb 5 oz (102.2 kg)   SpO2: 92% 93% 97% 99%    Intake/Output Summary (Last 24 hours) at 07/11/15 1205 Last data filed at 07/11/15 1003  Gross per 24 hour  Intake    340 ml  Output   2630 ml  Net  -2290 ml   Filed Weights   07/09/15 0136 07/10/15 0445 07/11/15 0416  Weight: 244 lb 7.8 oz (110.9 kg) 237 lb 14 oz (107.9 kg) 225 lb 5 oz (102.2 kg)    PHYSICAL EXAM General: Well developed, well nourished, male in no acute distress. Head: Normocephalic, atraumatic.  Neck: Supple without bruits, ear JVD. Lungs:  Resp regular and unlabored, crackles in BLL.  Heart: RRR, S1, S2, no S3, S4, or murmur; no rub. Abdomen: Soft, non-tender, distended, BS + x 4.  Extremities: No clubbing, cyanosis, +3 LLE edema.  Neuro: Alert and oriented X 3. Moves all extremities spontaneously. Psych: Normal affect.  LABS: CBC: Recent Labs  07/08/15 1825 07/11/15 0533  WBC 10.2 10.3  NEUTROABS 8.1*  --   HGB 12.1* 12.1*  HCT 39.1 38.2*  MCV 76.1* 74.3*  PLT 386 0000000   Basic Metabolic Panel: Recent Labs  07/10/15 0352 07/10/15 1600 07/11/15 0335  NA 141  --  142  K 3.8  --  3.7  CL 103  --  101  CO2 30  --  30    GLUCOSE 95  --  112*  BUN 21*  --  20  CREATININE 1.18  --  1.20  CALCIUM 8.6*  --  8.7*  MG  --  1.7 1.6*   Liver Function Tests: Recent Labs  07/08/15 1825  AST 36  ALT 45  ALKPHOS 137*  BILITOT 1.0  PROT 6.3*  ALBUMIN 2.7*   Cardiac Enzymes: Recent Labs  07/09/15 0402 07/09/15 0753 07/09/15 1243  TROPONINI 0.32* 0.31* 0.26*    Recent Labs  07/08/15 2203  TROPIPOC 0.21*   BNP:  B NATRIURETIC PEPTIDE  Date/Time Value Ref Range Status  07/08/2015 06:25 PM 1764.5* 0.0 - 100.0 pg/mL Final    Fasting Lipid Panel: Recent Labs  07/10/15 1025  CHOL 96  HDL 32*  LDLCALC 55  TRIG 47  CHOLHDL 3.0   Thyroid Function Tests: Recent Labs  07/10/15 1025  TSH 0.952    Current facility-administered medications:  .  acetaminophen (TYLENOL) tablet 650 mg, 650 mg, Oral, Q6H PRN **OR** acetaminophen (TYLENOL) suppository 650 mg, 650 mg, Rectal, Q6H PRN, Carlyle Dolly, MD .  aspirin EC tablet 81 mg, 81 mg, Oral, Daily, Margreta Journey  Lia Foyer, MD, 81 mg at 07/11/15 1003 .  atorvastatin (LIPITOR) tablet 20 mg, 20 mg, Oral, q1800, Caleb G Melancon, MD .  brimonidine (ALPHAGAN) 0.2 % ophthalmic solution 1 drop, 1 drop, Left Eye, BID, Carlyle Dolly, MD, 1 drop at 07/11/15 1004 .  carvedilol (COREG) tablet 3.125 mg, 3.125 mg, Oral, BID WC, Carlyle Dolly, MD .  dorzolamide-timolol (COSOPT) 22.3-6.8 MG/ML ophthalmic solution 1 drop, 1 drop, Left Eye, BID, Carlyle Dolly, MD, 1 drop at 07/11/15 1005 .  enoxaparin (LOVENOX) injection 40 mg, 40 mg, Subcutaneous, Q24H, Carlyle Dolly, MD, 40 mg at 07/11/15 1001 .  furosemide (LASIX) injection 40 mg, 40 mg, Intravenous, BID, Aquilla Hacker, MD, 40 mg at 07/11/15 0821 .  HYDROcodone-acetaminophen (NORCO/VICODIN) 5-325 MG per tablet 1 tablet, 1 tablet, Oral, Q6H PRN, Carlyle Dolly, MD, 1 tablet at 07/11/15 0434 .  insulin aspart (novoLOG) injection 0-9 Units, 0-9 Units, Subcutaneous, TID WC, Janora Norlander, DO, Stopped at 07/10/15 [phone] .  insulin aspart (novoLOG) injection 5 Units, 5 Units, Subcutaneous, TID WC, Carlyle Dolly, MD, 5 Units at 07/11/15 [phone] .  insulin glargine (LANTUS) injection 10 Units, 10 Units, Subcutaneous, q morning - 10a, Carlyle Dolly, MD, 10 Units at 07/11/15 1001 .  lisinopril (PRINIVIL,ZESTRIL) tablet 5 mg, 5 mg, Oral, Daily, Aquilla Hacker, MD, 5 mg at 07/11/15 1001 .  magnesium citrate solution 1 Bottle, 1 Bottle, Oral, Once, Carlyle Dolly, MD .  polyethylene glycol (MIRALAX / GLYCOLAX) packet 17 g, 17 g, Oral, Once, Carlyle Dolly, MD .  sodium chloride flush (NS) 0.9 % injection 3 mL, 3 mL, Intravenous, Q12H, Dickie La, MD, 3 mL at 07/11/15 1002 .  sodium chloride flush (NS) 0.9 % injection 3 mL, 3 mL, Intravenous, PRN, Dickie La, MD .  tetrahydrozoline 0.05 % ophthalmic solution 1 drop, 1 drop, Left Eye, BID, Carlyle Dolly, MD, 1 drop at 07/11/15 1004   TELE: NSR with frequent PAC's, with bursts of Stach       ECG: NSR, t wave inversion in lateral leads.   Echo: 07/10/15 - Left ventricle: The cavity size was mildly dilated. Wall  thickness was increased in a pattern of mild LVH. Systolic  function was severely reduced. The estimated ejection fraction  was 15%. Diffuse hypokinesis. Doppler parameters are consistent  with restrictive physiology, indicative of decreased left  ventricular diastolic compliance and/or increased left atrial  pressure. Doppler parameters are consistent with high ventricular  filling pressure. - Mitral valve: There was mild regurgitation. - Left atrium: The atrium was mildly dilated. - Right ventricle: The cavity size was mildly dilated. Systolic  function was severely reduced. - Right atrium: The atrium was mildly dilated. - Tricuspid valve: There was moderate-severe regurgitation. - Pulmonary arteries: Systolic pressure was severely increased. PA  peak pressure: 67 mm Hg (S). -  Pericardium, extracardiac: A small pericardial effusion was  identified.  Impressions:  - Severe global reduction in LV function; restrictive filling; 4  chamber enlargement; severely reduced RV function; mild MR;  moderate to severe TR; severely elevated pulmonary pressue.  Current Medications:  . aspirin EC  81 mg Oral Daily  . atorvastatin  20 mg Oral q1800  . brimonidine  1 drop Left Eye BID  . carvedilol  3.125 mg Oral BID WC  . dorzolamide-timolol  1 drop Left Eye BID  . enoxaparin (LOVENOX) injection  40 mg Subcutaneous Q24H  . furosemide  40 mg  Intravenous BID  . insulin aspart  0-9 Units Subcutaneous TID WC  . insulin aspart  5 Units Subcutaneous TID WC  . insulin glargine  10 Units Subcutaneous q morning - 10a  . lisinopril  5 mg Oral Daily  . magnesium citrate  1 Bottle Oral Once  . polyethylene glycol  17 g Oral Once  . sodium chloride flush  3 mL Intravenous Q12H  . tetrahydrozoline  1 drop Left Eye BID      ASSESSMENT AND PLAN: Active Problems:   CHF (congestive heart failure) (HCC)   Dyspnea   Pressure ulcer   Elevated troponin   Scrotal swelling  1. Acute on chronic systolic and diastolic CHF: Patient presented with cough for 2 months, SOB, scrotal and lower extremity edema. BNP elevated on admission at 1764.5.  Patient's echo from yesterday shows reduced EF of 15% and diffuse hypokinesis.  Patient has received 2 doses of IV lasix, 2.7 L out so far. Still volume overloaded on exam with ear JVD., his creatinine is stable, would increase IV diuresis to 80mg  BID. He will need ischemic evaluation by cath, can do right heart cath as well, MD to advise. Will change Lovenox to SQ heparin in anticipation for cath. He will need more diuresis before we can cath.   He reports eating hamburgers and canned fish at home, educated about sodium restrictions.   Patient is on ACE, Coreg. Increase coreg to 12.5mg  BID for better BP control as well as for LV dysfunction.   Will add Aldactone 12.5mg  daily and follow BMET.    2. HTN: Patient with SBP of 140-150's.  See above re: addition of hydralazine.   3.  Dilated cardiomyopathy with diffuse hypokinesis on echo EF 15%.  Suspect this may be due to hypertensive CM since he stopped all BP meds a few months ago and diastolic BP has been markedly elevated since admission here.  Needs aggressive BP control and counseling on medication and restricted sodium diet compliance.  He does have DM, dyslipidemia and HTN as well as PVD and a history of tobacco use so need to rule ischemia as possible etiology of severe LV dysfunction. Will plan right and left heart cath once CHF has improved.  4.  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  Will plan to continue diuresis and once he appears near euvolemia plan right heart cath to further assess.  5.  DM - per TRH  Signed, Arbutus Leas , NP 12:05 PM   Patient seen and independently examined with Jettie Booze, NP. We discussed all aspects of the encounter. I agree with the assessment and plan as stated above with some modifications to the assessment and plan.  Patient admitted with acute CHF in setting of stopping all BP meds a few months ago and was markedly hypertensive.  2D echo showed severe LV dysfunction with EF 15% and diffuse HK most likely related to hypertensive DCM but also need to consider ischemia with his history of remote tobacco use, HTN, dyslipidemia and PVD. He still appears volume overloaded.  Will titrate coreg as BP tolerates.  Hold Lisinopril at 5mg  daily for now and after cath can increase as renal function and BP allow.  Will increase Lasix to 80mg  IV BID and add aldactone 12.5mg  daily and follow BMET closely.  Needs counseling on being compliant  with his meds and diet.  Will plan right and left heart cath when he appears to be nearing  euvolemia.  I have spent 40 minutes with patient care including reading prior consult note and admission note, reviewing echo report, labs, telemetry and examining and interviewing patient as well as developing and assessment and plan.  > 50% of time was spent in direct patient care.      Signed: Fransico Him, MD [facility] 07/11/2015 07/11/2015 Pager [phone]

## 2015-07-11 NOTE — Progress Notes (Signed)
Family Medicine Teaching Service Daily Progress Note Intern Pager: [phone]  Patient name: [name] Medical record number: [id number] Date of birth: [birth year] Age: 60 y.o. Gender: male  Primary Care Provider: Tawanna Sat, MD Consultants: none Code Status: Full   Pt Overview and Major Events to Date:  5/5: admitted for new onset HF   Assessment and Plan: [name] is a 60 y.o. male presenting with new onset CHF . PMH is significant for hypertension, schizophrenia, diabetes mellitus with peripheral neuropathy.   CHF:  New onset. 4 chamber enlargement with global hypokinesia. EF 15%. 2530cc out. Anasarca.  - Cards rec's appreciated - Per cards recommendation:  - Increase IV Lasix to 80 BID. Awaiting on more diuresing before cath  - Needs ischemic evaluation by cath with potential right heart cath as well.    - Lovenox switched to SQ heparin in anticipation for cath.  - Echocardiogram 5/7:  Severe global reduction in LV function; restrictive filling; 4 chamber enlargement; severely reduced RV function; mild MR;moderate to severe TR; severely elevated pulmonary pressue. - Risk Strat with Lipids, TSH 0.952, A1C 12.3  - ASCVD 10 year risk likely > 7.5%. Started Lipitor 20 QD - Started ASA 81 mg daily  HTN: Elevated BP of 162/121 on admission, now 149/105. Antihypertensives stopped while in hospital 2 months ago (Losartan-HCTZ.) - Needs ACEi with DMII. Lisinopril started as above.  - Per cards, consider addition of hydralazine 25mg  TID. Would hold to see how BPs respond to more diurese before adding hydralazine TID.   Scrotal/penile swelling: some improvement. - Bladder scanning. No discomfort.  - Foley catheter in place  Hypomagnesemia -- Magnesium of 1.6 today. Replete and use Magnesium citrate for BM regimen.   T2DM, uncontrolled: Last A1c 12.3 on 07/06/15. Home meds: Lantus 10 U every morning, Novolog 5 U TID before meals. Patient admits to taking Lantus 1-2/week and  Novolog 1-2x/week.  - CBG113 this morning - Sensitive SSI changed from Moderate due to hypoglycemia - CBG of 65 at 1145  followed by a CBG of 222 at 1245 today  - Diabetes educator recs: decrease Novolog to 3 units TID before meals per the Glycemic Control order-set - Continue home meds - Continue to monitor CBGs  Constipation. Distended abdomen with discomfort.  - Last BM 2 days ago.  - Magnesium citrate to stimulate BM with additional benefit of maintaining magnesium level through some absorption.   Knee wounds and S/p R BKA. Patient has been crawling around on his knees at home to get around. Not infected.  Not painful.  - Wound care communicated with Dr. Sharol Given and noted that they would dress wounds but do not need to follow during this hospitalization - Amputation and previous surgical care with ortho.  - HH/ Care Management at d/c to make sure he has appropriate help and ambulatory devices at home.   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 Mild.  - ACE I started.  - Follow as outpatient. / Workup as indicated.   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 Pupil non-reactive and dilated. Followed by ophtho as an outpatient. - Continue home Brimodine, Cosopt, and visine drops in left eye.   FEN/GI: Heart health/carb modified, KVO  PPx: Sub Q heparin  Disposition: pending improvement   Subjective:  Feels better  today than yesterday. Also feels that his scrotal swelling is improved from yesterday but still uncomfortable.  Endorses SOB, Orthopnea, nausea and cough- all of which he states are improved from yesterday (/ last month) No other complaints. No CP, fever, chills, palpitations, dizziness or vomiting.   Objective: Temp:  [97.3 F (36.3 C)-98.5 F  (36.9 C)] 98.5 F (36.9 C) (05/08 0416) Pulse Rate:  [86-89] 89 (05/08 1013) Resp:  [18-20] 18 (05/08 1013) BP: (136-155)/(96-105) 136/96 mmHg (05/08 1013) SpO2:  [93 %-99 %] 99 % (05/08 1013) Weight:  [102.2 kg (225 lb 5 oz)] 102.2 kg (225 lb 5 oz) (05/08 0416)  Physical Exam: General: NAD, sitting in bed watching tv.  Cardiovascular: irregular rhythm- not on a-fib, S1S2 and intermittent S3, systolic murmurs on sternal borders. Bibasilar crackles. JVD at jaw/ear line. Intermittent cardiac wheezing.  Respiratory: Bilateral breath sounds, no increased WOB, crackles in BL bases.  Abdomen: soft, distended, slightly tender to palpation, + abdominal edema. Negative hepatojugular reflex. Negative abdominal fluid wave.  Extremities: right BKA, 3+ edema of both extremities and in his stump and up his thighs. Ulcerations noted on both knees without erythema, and base covered with granulation tissue. No visible bone. Stage 2.  G/U: Scrotal and Penile edema with significant swelling, but decreased from yesterday. Tender to palpation. Penile head barely visible.   Laboratory:  Recent Labs Lab 07/06/15 1056 07/08/15 1825 07/11/15 0533  WBC 9.4 10.2 10.3  HGB 11.9* 12.1* 12.1*  HCT 39.6 39.1 38.2*  PLT 391 386 353    Recent Labs Lab 07/08/15 1825 07/09/15 0407 07/10/15 0352 07/11/15 0335  NA 138 140 141 142  K 4.3 4.0 3.8 3.7  CL 101 102 103 101  CO2 28 28 30 30   BUN 19 17 21* 20  CREATININE 1.15 0.99 1.18 1.20  CALCIUM 9.0 8.9 8.6* 8.7*  PROT 6.3*  --   --   --   BILITOT 1.0  --   --   --   ALKPHOS 137*  --   --   --   ALT 45  --   --   --   AST 36  --   --   --   GLUCOSE 264* 221* 95 112*    Imaging/Diagnostic Tests: TTE 07/10/15: Severe global reduction in LV function; restrictive filling; 4 chamber enlargement; severely reduced RV function; mild MR; moderate to severe TR; severely elevated pulmonary pressue.   [name], Med Student 07/11/2015, 3:09 PM MS4, [place] Intern pager: [phone], text pages welcome  I agree with the above evaluation, assessment, and plan. Any correctional changes can be noted in [facility].   Aquilla Hacker, MD Family Medicine Resident - PGY 2  Agree with the above medical student evaluation assessment and plan. See below for my own.   S: doing better, feels better overall.   O:  Vitals reviewed and appropriate. Mildly hypertensive.  GEn: NAD, resting comfortably.  CV: RRR, no MGR, normal S1/S2, JVD to ear. Some HJR.  REsp: CTA BL Abd: mild distension with 1+ abd wall edema.  G/U: Scrotal / penile swelling improving, but still significant, foley in place.  Ext: WWP, 3+ edema in entire BL LE.   Assessment: 60 y/o M with New on set CHFrEF 15%, and Anasarca.  HFrEF: Maximize medical mgmt with lipitor, Coreg, Aldactone, Lasix, ACEi, ASA. Appreciate cards recs. Cath likely once overload reduced.  DMII: CBG's well controlled, and on the low side. Reducing mealtime coverage.  Electrolytes: goal Mg > 2, K > 4.  HTN: Monitor with addition of recent Coreg, Lisinopril, Aldactone, Lasix.

## 2015-07-12 DIAGNOSIS — E46 Unspecified protein-calorie malnutrition: Secondary | ICD-10-CM

## 2015-07-12 DIAGNOSIS — N5089 Other specified disorders of the male genital organs: Secondary | ICD-10-CM

## 2015-07-12 LAB — RENAL FUNCTION PANEL
Albumin: 2.3 g/dL — ABNORMAL LOW (ref 3.5–5.0)
Anion gap: 14 (ref 5–15)
BUN: 18 mg/dL (ref 6–20)
CO2: 30 mmol/L (ref 22–32)
Calcium: 8.6 mg/dL — ABNORMAL LOW (ref 8.9–10.3)
Chloride: 97 mmol/L — ABNORMAL LOW (ref 101–111)
Creatinine, Ser: 1.22 mg/dL (ref 0.61–1.24)
GFR calc Af Amer: >60 mL/min (ref >60)
GFR calc non Af Amer: >60 mL/min (ref >60)
Glucose, Bld: 87 mg/dL (ref 65–99)
Phosphorus: 5.1 mg/dL — ABNORMAL HIGH (ref 2.5–4.6)
Potassium: 3.8 mmol/L (ref 3.5–5.1)
Sodium: 141 mmol/L (ref 135–145)

## 2015-07-12 LAB — GLUCOSE, CAPILLARY
Glucose-Capillary: 93 mg/dL (ref 65–99)
Glucose-Capillary: 110 mg/dL — ABNORMAL HIGH (ref 65–99)
Glucose-Capillary: 210 mg/dL — ABNORMAL HIGH (ref 65–99)
Glucose-Capillary: 187 mg/dL — ABNORMAL HIGH (ref 65–99)

## 2015-07-12 LAB — MAGNESIUM: Magnesium: 1.6 mg/dL — ABNORMAL LOW (ref 1.7–2.4)

## 2015-07-12 MED ORDER — TAMSULOSIN HCL 0.4 MG PO CAPS
0.4000 mg | ORAL_CAPSULE | Freq: Every day | ORAL | Status: DC
  Administered 2015-07-12 – 2015-07-14 (×3): 0.4 mg via ORAL
  Filled 2015-07-12 (×3): qty 1

## 2015-07-12 MED ORDER — SODIUM CHLORIDE 0.9 % IV SOLN
INTRAVENOUS | Status: DC
  Administered 2015-07-13: 09:00:00 via INTRAVENOUS

## 2015-07-12 MED ORDER — INSULIN ASPART 100 UNIT/ML ~~LOC~~ SOLN
3.0000 [IU] | Freq: Three times a day (TID) | SUBCUTANEOUS | Status: DC
  Administered 2015-07-12 (×2): 3 [IU] via SUBCUTANEOUS

## 2015-07-12 MED ORDER — ATORVASTATIN CALCIUM 40 MG PO TABS
40.0000 mg | ORAL_TABLET | Freq: Every day | ORAL | Status: DC
  Administered 2015-07-12 – 2015-07-19 (×8): 40 mg via ORAL
  Filled 2015-07-12 (×8): qty 1

## 2015-07-12 MED ORDER — ASPIRIN 81 MG PO CHEW
81.0000 mg | CHEWABLE_TABLET | ORAL | Status: AC
  Administered 2015-07-13: 81 mg via ORAL
  Filled 2015-07-12: qty 1

## 2015-07-12 MED ORDER — HYDRALAZINE HCL 25 MG PO TABS
25.0000 mg | ORAL_TABLET | Freq: Three times a day (TID) | ORAL | Status: DC
  Administered 2015-07-12 – 2015-07-20 (×24): 25 mg via ORAL
  Filled 2015-07-12 (×26): qty 1

## 2015-07-12 MED ORDER — CARVEDILOL 25 MG PO TABS
25.0000 mg | ORAL_TABLET | Freq: Two times a day (BID) | ORAL | Status: DC
  Administered 2015-07-12 – 2015-07-15 (×7): 25 mg via ORAL
  Filled 2015-07-12 (×7): qty 1

## 2015-07-12 MED ORDER — MAGNESIUM SULFATE 2 GM/50ML IV SOLN
2.0000 g | Freq: Once | INTRAVENOUS | Status: AC
  Administered 2015-07-12: 2 g via INTRAVENOUS
  Filled 2015-07-12: qty 50

## 2015-07-12 NOTE — Progress Notes (Signed)
Patient Name: [name] Date of Encounter: 07/12/2015  Active Problems:   HYPERTENSION, BENIGN SYSTEMIC   CHF (congestive heart failure) (HCC)   Dyspnea   Pressure ulcer   Elevated troponin   Scrotal swelling   Acute systolic CHF (congestive heart failure) ([place])   Congestive dilated cardiomyopathy [facility])   Primary Cardiologist: New Patient Profile: Mr. Gephart is a 60 year old male with a past medical history of HTN, DM. S/p right BKA. Presented to the ED with scrotal swelling and lower extremity edema. BNP was 1764, troponin was 0.21. Echo shows EF of 15%.   SUBJECTIVE: Breathing better, says his cough is gone.   OBJECTIVE Filed Vitals:   07/11/15 1850 07/11/15 2051 07/12/15 0100 07/12/15 0638  BP: 152/59 140/95 140/97 138/94  Pulse: 92 88 70 75  Temp:  98.4 F (36.9 C) 98.4 F (36.9 C) 98.9 F (37.2 C)  TempSrc:  Oral Oral Oral  Resp: 20 20 18 18   Height:      Weight:    223 lb 8.7 oz (101.4 kg)  SpO2: 92% 93% 92% 95%    Intake/Output Summary (Last 24 hours) at 07/12/15 1006 Last data filed at 07/12/15 0940  Gross per 24 hour  Intake    840 ml  Output   3625 ml  Net  -2785 ml   Filed Weights   07/10/15 0445 07/11/15 0416 07/12/15 [id number]  Weight: 237 lb 14 oz (107.9 kg) 225 lb 5 oz (102.2 kg) 223 lb 8.7 oz (101.4 kg)    PHYSICAL EXAM General: Well developed, well nourished, male in no acute distress. Head: Normocephalic, atraumatic.  Neck: Supple without bruits, jaw JVD. Lungs:  Resp regular and unlabored, crackles in bilateral lower lobes.  Heart: RRR, S1, S2, no S3, S4, or murmur; no rub. Abdomen: Soft, non-tender, non-distended, BS + x 4.  Extremities: No clubbing, cyanosis, +3 left lower extremity edema.  Neuro: Alert and oriented X 3. Moves all extremities spontaneously. Psych: Normal affect.  LABS: CBC: Recent Labs  07/11/15 0533  WBC 10.3  HGB 12.1*  HCT 38.2*  MCV 74.3*  PLT 0000000   Basic Metabolic Panel: Recent Labs   07/10/15 1600 07/11/15 0335 07/12/15 0525  NA  --  142 141  K  --  3.7 3.8  CL  --  101 97*  CO2  --  30 30  GLUCOSE  --  112* 87  BUN  --  20 18  CREATININE  --  1.20 1.22  CALCIUM  --  8.7* 8.6*  MG 1.7 1.6*  --   PHOS  --   --  5.1*   Liver Function Tests: Recent Labs  07/12/15 0525  ALBUMIN 2.3*   Cardiac Enzymes: Recent Labs  07/09/15 1243  TROPONINI 0.26*   BNP:  B NATRIURETIC PEPTIDE  Date/Time Value Ref Range Status  07/08/2015 06:25 PM 1764.5* 0.0 - 100.0 pg/mL Final   Fasting Lipid Panel: Recent Labs  07/10/15 1025  CHOL 96  HDL 32*  LDLCALC 55  TRIG 47  CHOLHDL 3.0   Thyroid Function Tests: Recent Labs  07/10/15 1025  TSH 0.952    Current facility-administered medications:  .  acetaminophen (TYLENOL) tablet 650 mg, 650 mg, Oral, Q6H PRN **OR** acetaminophen (TYLENOL) suppository 650 mg, 650 mg, Rectal, Q6H PRN, Carlyle Dolly, MD .  aspirin EC tablet 81 mg, 81 mg, Oral, Daily, Carlyle Dolly, MD, 81 mg at 07/11/15 1003 .  atorvastatin (LIPITOR) tablet 40  mg, 40 mg, Oral, q1800, Caleb G Melancon, MD .  brimonidine (ALPHAGAN) 0.2 % ophthalmic solution 1 drop, 1 drop, Left Eye, BID, Carlyle Dolly, MD, 1 drop at 07/11/15 2126 .  carvedilol (COREG) tablet 12.5 mg, 12.5 mg, Oral, BID WC, Arbutus Leas, NP, 12.5 mg at 07/12/15 [id number] .  dorzolamide-timolol (COSOPT) 22.3-6.8 MG/ML ophthalmic solution 1 drop, 1 drop, Left Eye, BID, Carlyle Dolly, MD, 1 drop at 07/11/15 2126 .  furosemide (LASIX) injection 80 mg, 80 mg, Intravenous, BID, Arbutus Leas, NP, 80 mg at 07/12/15 0837 .  heparin injection 5,000 Units, 5,000 Units, Subcutaneous, Q8H, Arbutus Leas, NP .  hydrALAZINE (APRESOLINE) tablet 25 mg, 25 mg, Oral, Q8H, Caleb G Melancon, MD .  HYDROcodone-acetaminophen (NORCO/VICODIN) 5-325 MG per tablet 1 tablet, 1 tablet, Oral, Q6H PRN, Carlyle Dolly, MD, 1 tablet at 07/11/15 2306 .  insulin aspart (novoLOG) injection 0-9 Units,  0-9 Units, Subcutaneous, TID WC, Janora Norlander, DO, Stopped at 07/10/15 [phone] .  insulin aspart (novoLOG) injection 3 Units, 3 Units, Subcutaneous, TID WC, Caleb G Melancon, MD .  insulin glargine (LANTUS) injection 10 Units, 10 Units, Subcutaneous, q morning - 10a, Carlyle Dolly, MD, 10 Units at 07/11/15 1001 .  magnesium sulfate IVPB 2 g 50 mL, 2 g, Intravenous, Once, Caleb G Melancon, MD .  sodium chloride flush (NS) 0.9 % injection 3 mL, 3 mL, Intravenous, Q12H, Dickie La, MD, 3 mL at 07/11/15 2129 .  sodium chloride flush (NS) 0.9 % injection 3 mL, 3 mL, Intravenous, PRN, Dickie La, MD .  spironolactone (ALDACTONE) tablet 12.5 mg, 12.5 mg, Oral, Daily, Sueanne Margarita, MD, 12.5 mg at 07/11/15 1837 .  tetrahydrozoline 0.05 % ophthalmic solution 1 drop, 1 drop, Left Eye, BID, Carlyle Dolly, MD, 1 drop at 07/11/15 2126   TELE: NSR    ECG: NSR  Echo 07/10/15: - Left ventricle: The cavity size was mildly dilated. Wall  thickness was increased in a pattern of mild LVH. Systolic  function was severely reduced. The estimated ejection fraction  was 15%. Diffuse hypokinesis. Doppler parameters are consistent  with restrictive physiology, indicative of decreased left  ventricular diastolic compliance and/or increased left atrial  pressure. Doppler parameters are consistent with high ventricular  filling pressure. - Mitral valve: There was mild regurgitation. - Left atrium: The atrium was mildly dilated. - Right ventricle: The cavity size was mildly dilated. Systolic  function was severely reduced. - Right atrium: The atrium was mildly dilated. - Tricuspid valve: There was moderate-severe regurgitation. - Pulmonary arteries: Systolic pressure was severely increased. PA  peak pressure: 67 mm Hg (S). - Pericardium, extracardiac: A small pericardial effusion was  identified.  Impressions:  - Severe global reduction in LV function; restrictive filling; 4   chamber enlargement; severely reduced RV function; mild MR;  moderate to severe TR; severely elevated pulmonary pressue  Current Medications:  . aspirin EC  81 mg Oral Daily  . atorvastatin  40 mg Oral q1800  . brimonidine  1 drop Left Eye BID  . carvedilol  12.5 mg Oral BID WC  . dorzolamide-timolol  1 drop Left Eye BID  . furosemide  80 mg Intravenous BID  . heparin subcutaneous  5,000 Units Subcutaneous Q8H  . hydrALAZINE  25 mg Oral Q8H  . insulin aspart  0-9 Units Subcutaneous TID WC  . insulin aspart  3 Units Subcutaneous TID WC  . insulin glargine  10 Units Subcutaneous q  morning - 10a  . magnesium sulfate 1 - 4 g bolus IVPB  2 g Intravenous Once  . sodium chloride flush  3 mL Intravenous Q12H  . spironolactone  12.5 mg Oral Daily  . tetrahydrozoline  1 drop Left Eye BID      ASSESSMENT AND PLAN: Active Problems:   HYPERTENSION, BENIGN SYSTEMIC   CHF (congestive heart failure) (HCC)   Dyspnea   Pressure ulcer   Elevated troponin   Scrotal swelling   Acute systolic CHF (congestive heart failure) (HCC)   Congestive dilated cardiomyopathy ([place])  1. Acute on chronic systolic and diastolic CHF: Patient presented with cough for 2 months, SOB, scrotal and lower extremity edema. BNP elevated on admission at 1764.5. Patient's echo from yesterday shows reduced EF of 15% and diffuse hypokinesis. PA pressure elevated at 67.    Getting IV furosemide 80mg  BID.  Arlyce Harman added yesterday. Good response to diuresis, with almost 2 L out, still only -6.4 for admission. Still has jaw JVD, although he says he is breathing better and his cough has resolved. Creatinine is stable from yesterday after increase in Lasix, continue same dosing today. He might be ready for cath tomorrow.   He reports eating hamburgers and canned fish at home, educated about sodium restrictions.   Patient is on Hydralazine and Coreg. Increase coreg to 25mg  BID for better BP control as well as for LV dysfunction.    2. HTN: Better today but still elevated, Coreg increased yesterday and will increase again today.   3. Dilated cardiomyopathy with diffuse hypokinesis on echo EF 15%. Suspect this may be due to hypertensive CM since he stopped all BP meds a few months ago and diastolic BP has been markedly elevated since admission here. Needs aggressive BP control and counseling on medication and restricted sodium diet compliance. He does have DM, dyslipidemia and HTN as well as PVD and a history of tobacco use so need to rule ischemia as possible etiology of severe LV dysfunction. Will plan right and left heart cath once CHF has improved.  4. 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 Will plan to continue diuresis and once he appears near euvolemia plan right heart cath to further assess.  5. DM - per TRH  Signed, Arbutus Leas , NP 10:06 AM 07/12/2015 Pager [phone]  Patient seen and independently examined with Jettie Booze, NP. We discussed all aspects of the encounter. I agree with the assessment and plan as stated above. Patient diuresing well with increased lasix and Aldactone, Still volume overloaded. Renal function stable and potassium stable.  Continue IV diuretics and spiro.  Continue BB/ACE I for LV dysfunction.  BP still poorly controlled so will increase Coreg to 25mg  BID.  I think he will be ready for right and left heart cath tomorrow.  Cardiac catheterization was discussed with the patient fully. The patient understands that risks include but are not limited to stroke (1 in 1000), death (1 in 79), kidney failure [usually temporary] (1 in 500), bleeding (1 in 200), allergic reaction [possibly serious] (1 in 200).  The patient understands and is willing to proceed.  I have spent 35 minutes with patient reviewing labs, Vital signs, echo and developing assessment  and plan and discussing risks and benefits of cath in detail.  > 50% of time was spent in direct patient  care.  Signed: Fransico Him, MD [facility] 07/12/2015

## 2015-07-12 NOTE — Progress Notes (Signed)
Family Medicine Teaching Service Daily Progress Note Intern Pager: [phone]  Patient name: [name] Medical record number: [id number] Date of birth: [birth year] Age: 60 y.o. Gender: male  Primary Care Provider: Tawanna Sat, MD Consultants: none Code Status: Full   Pt Overview and Major Events to Date:  5/5: admitted for new onset HF   Assessment and Plan: KHRIZ BAILIE is a 60 y.o. male presenting with new onset CHF . PMH is significant for hypertension, schizophrenia, diabetes mellitus with peripheral neuropathy.   CHF:  New onset. 4 chamber enlargement with global hypokinesia. HFrEF15%. 4050cc out. Weight 101.4 kg. Anasarca.  -Continue diuresing - Electrolytes goal: Mg > 2, K > 4.  - K 3.8. Will not replete K at this point given recent addition of Aldactone. - Cards recs appreciated  - Coreg  increased to 12.5mg  BID for better BP control and  LV dysfunction.Aldactone  12.5mg  daily added. Follow  BMET.  - holding lisinopril.   - IV Lasix to 80 BID. Once nearly euvolemic will proceed to cath    - Lovenox switched to SQ heparin  on 5/8 in anticipation for cath - Risk Strat:  - Lipids wnl LDL 55 aside from HDL slightly low at 35  - TSH 0.952  - A1C 12.3  - started lipitor 40.  - Started ASA 81 mg daily  HTN: Elevated BP of 162/121 on admission, now 138/94. Antihypertensives stopped while in hospital 2 months ago (Losartan-HCTZ.) - ACEi with DMII. Lisinopril 5 mg QD.  - Add Hydralazine 25mg  TID   Scrotal/penile swelling: Improving. Able to visualize glans now without exam.  - some discomfort. Getting better.  - Foley catheter in place  Hypomagnesemia: Goal Mg>2 - Mg 1.6 yesterday.  - Repleting this am, and checking mag level.   T2DM, uncontrolled: Last A1c 12.3 on 07/06/15. Home meds: Lantus 10 U every morning, Novolog 5 U TID before meals. Patient admits to taking Lantus 1-2/week and Novolog 1-2x/week.  - CBG93 this morning - CBG of 65 at 1145  followed by a CBG  of 222 at 1245 today  - Mealtime novolog decreased to 3u with meals.  - Continue to monitor CBGs  Schizophrenia - Patient alert, oriented and no signs of hallucinations or delusions on exam  - Consider psych consult for potential maximization of hospital care and medication compliance  Constipation. Resolved - If becomes a problem, consider magnesium citrate to stimulate BM with additional benefit of increasing magnesium level through some absorption..   Knee wounds and S/p R BKA. Patient has been crawling around on his knees at home to get around. Not infected.  Not painful.  - Wound care communicated with Dr. Sharol Given and noted that they would dress wounds but do not need to follow during this hospitalization - Amputation and previous surgical care with ortho.  - HH/ Care Management at d/c to make sure he has appropriate help and ambulatory devices at home.   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 Mild.  - ACE I - holding for now.  - Follow as outpatient. / Workup as indicated.   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 Pupil non-reactive and dilated. Followed by ophtho as an outpatient. - Continue home Brimodine, Cosopt, and visine drops in left eye.   FEN/GI: Heart health/carb modified, KVO  PPx: Sub Q heparin  Disposition: pending improvement   Subjective:  Feels better today than yesterday. Also feels that his scrotal swelling is improved from yesterday  but still uncomfortable.  Endorses SOB, Orthopnea, nausea and cough- all of which he states are improved from yesterday (/ last month) No other complaints. No CP, fever, chills, palpitations, dizziness or vomiting.   Objective: Temp:  [98.4 F (36.9 C)-98.9 F (37.2 C)] 98.9 F (37.2 C) (05/09 [id number]) Pulse Rate:  [70-92] 75 (05/09 0638) Resp:  [18-20] 18 (05/09 0638) BP: (136-152)/(59-97) 138/94 mmHg (05/09 0638) SpO2:  [92 %-99 %] 95 % (05/09 [id number]) Weight:  [101.4 kg (223 lb 8.7 oz)]  101.4 kg (223 lb 8.7 oz) (05/09 [id number])  Physical Exam: General: NAD, sitting in bed watching tv.  Cardiovascular: irregular rhythm- not on a-fib, S1S2 and intermittent S3, systolic murmurs on sternal borders.  JVD at jaw/ear line.   Respiratory: Bilateral breath sounds, no increased WOB,  No bibasilar crackles appreciated today. Abdomen: soft, distended, nontender to palpation, + abdominal edema. Negative hepatojugular reflex. Negative abdominal fluid wave.  Extremities: right BKA, 3+ edema of both extremities and in his stump and up his thighs. Ulcerations noted on both knees without erythema, and base covered with granulation tissue. No visible bone. Stage 2.  G/U: Scrotal and Penile edema with significant swelling, not decreased from yesterday. Tender to palpation. Penile head barely visible.   Laboratory:  Recent Labs Lab 07/06/15 1056 07/08/15 1825 07/11/15 0533  WBC 9.4 10.2 10.3  HGB 11.9* 12.1* 12.1*  HCT 39.6 39.1 38.2*  PLT 391 386 353    Recent Labs Lab 07/08/15 1825  07/10/15 0352 07/11/15 0335 07/12/15 0525  NA 138  < > 141 142 141  K 4.3  < > 3.8 3.7 3.8  CL 101  < > 103 101 97*  CO2 28  < > 30 30 30   BUN 19  < > 21* 20 18  CREATININE 1.15  < > 1.18 1.20 1.22  CALCIUM 9.0  < > 8.6* 8.7* 8.6*  PROT 6.3*  --   --   --   --   BILITOT 1.0  --   --   --   --   ALKPHOS 137*  --   --   --   --   ALT 45  --   --   --   --   AST 36  --   --   --   --   GLUCOSE 264*  < > 95 112* 87  < > = values in this interval not displayed.  Imaging/Diagnostic Tests: TTE 07/10/15: Severe global reduction in LV function; restrictive filling; 4 chamber enlargement; severely reduced RV function; mild MR; moderate to severe TR; severely elevated pulmonary pressue.   [name], Med Student 07/12/2015, 7:11 AM MS4, [place] Intern pager: [phone], text pages welcome  I agree with the above evaluation, assessment, and plan. Any correctional changes can be  noted in [facility].   Aquilla Hacker, MD Family Medicine Resident - PGY 2  S: Feeling better than when he came in. Good urine output. 4L overnight. Scrotal swelling improving.   O:  Filed Vitals:   07/12/15 0100 07/12/15 0638  BP: 140/97 138/94  Pulse: 70 75  Temp: 98.4 F (36.9 C) 98.9 F (37.2 C)  Resp: 18 18   Gen: NAD, REsting comfortably.  CV: RRR, no MGR, normal S1/S2, 2+ distal pulses.  Resp: CTA BL Abd: slight abdominal wall edema G/U: scrotal edema improved with glans visible without exam at this time.  Ext: 3+ edema to the abdomen. Improving.   A/P: 60  y/o M with HFrEF 15%, Diuresing well.  - Cards planning to cath R/L HC.  - Holding lisinopril for now.  - Continue coreg, spironolactone, added Hydralazine 25mg  TID - Continue Lasix 80mg  IV BID.  - Continue foley, likely d/c tomorrow with further improvement of scrotal edema.  - Started flomax to reduce incidence of urinary retention post-foley d/c - UOB as able.  - Replete electrolytes as needed.

## 2015-07-12 NOTE — Progress Notes (Signed)
Occupational Therapy Treatment Patient Details Name: TAYLEN ADCOCK MRN: [id number] DOB: 1955-09-15 Today's Date: 07/12/2015    History of present illness Patient is a 60 yo male admitted 07/08/15 with swelling - new onset CHF, and wounds bil knees.  PMH:  HTN, schizophrenia, DM, peripheral neuropathy, Rt BKA, blind Lt eye   OT comments  Pt instructed how to maneuver RW through narrow spaces safely, and safety with LB ADLs.  Performed bil. UE strengthening   Follow Up Recommendations  Home health OT;Supervision - Intermittent    Equipment Recommendations  None recommended by OT    Recommendations for Other Services      Precautions / Restrictions Precautions Precautions: Fall Precaution Comments: Has Rt prosthesis that doesn't fit.       Mobility Bed Mobility                  Transfers Overall transfer level: Needs assistance Equipment used: Rolling walker (2 wheeled) Transfers: Sit to/from [facility] Sit to Stand: Min assist              Balance                                   ADL                                         General ADL Comments: Pt had just finished with PT and c/o scrotal pain due to edema.  Pt instructed how to maneuver RW through narrow spaces (he reports that this is why he started crawiling on his knees in the first place - walker and w/c don't fit through bathroom door).  Problem solved through options.  Pt also insructed in safe technique for pulling pants over hips - will need reinforcement       Vision                     Perception     Praxis      Cognition   Behavior During Therapy: WFL for tasks assessed/performed Overall Cognitive Status: Within Functional Limits for tasks assessed                       Extremity/Trunk Assessment               Exercises General Exercises - Upper Extremity Chair Push Up: Strengthening;Right;Left;20 reps (x 3 sets  )   Shoulder Instructions       General Comments      Pertinent Vitals/ Pain       Pain Assessment: Faces Faces Pain Scale: Hurts even more Pain Location: scrotum  Pain Descriptors / Indicators: Grimacing;Guarding Pain Intervention(s): Limited activity within patient's tolerance;Repositioned  Home Living                                          Prior Functioning/Environment              Frequency Min 2X/week     Progress Toward Goals  OT Goals(current goals can now be found in the care plan section)  Progress towards OT goals: Progressing toward goals     Plan Discharge plan remains appropriate  Co-evaluation                 End of Session Equipment Utilized During Treatment: Gait belt;Rolling walker   Activity Tolerance Patient limited by pain   Patient Left in chair;with call bell/phone within reach;with chair alarm set   Nurse Communication Mobility status        Time: [id number] OT Time Calculation (min): 21 min  Charges: OT General Charges $OT Visit: 1 Procedure OT Treatments $Self Care/Home Management : 8-22 mins  Conarpe, Wendi M 07/12/2015, 8:13 PM

## 2015-07-12 NOTE — Progress Notes (Signed)
Physical Therapy Treatment Patient Details Name: [name] MRN: [id number] DOB: 08-22-1955 Today's Date: 07/12/2015    History of Present Illness Patient is a 60 yo male admitted 07/08/15 with swelling - new onset CHF, and wounds bil knees.  PMH:  HTN, schizophrenia, DM, peripheral neuropathy, Rt BKA, blind Lt eye    PT Comments    [name] made good progress today, ambulating in room w/ min assist to steady.  He declined ambulating in hallway multiple times.  Pt will benefit from continued skilled PT services to increase functional independence and safety.   Follow Up Recommendations  Home health PT;Supervision - Intermittent     Equipment Recommendations  Other (comment) (sliding board)    Recommendations for Other Services       Precautions / Restrictions Precautions Precautions: Fall Precaution Comments: Has Rt prosthesis that doesn't fit. Restrictions Weight Bearing Restrictions: No    Mobility  Bed Mobility Overal bed mobility: Modified Independent             General bed mobility comments: Increased time due to pain.  Transfers Overall transfer level: Needs assistance Equipment used: Rolling walker (2 wheeled) (sliding board) Transfers: Sit to/from Stand Sit to Stand: Min assist         General transfer comment: Assist to boost from bed and to steady.  Cues for proper hand placement.  Ambulation/Gait Ambulation/Gait assistance: Min assist Ambulation Distance (Feet): 40 Feet Assistive device: Rolling walker (2 wheeled) Gait Pattern/deviations:  (hop on Lt LE)   Gait velocity interpretation: Below normal speed for age/gender General Gait Details: Min assist to steady, no overt LOB.  Pt declined ambulating in hallway mutliple times.   Stairs            Wheelchair Mobility    Modified Rankin (Stroke Patients Only)       Balance Overall balance assessment: Needs assistance Sitting-balance support: Feet supported;Single extremity  supported Sitting balance-Leahy Scale: Good Sitting balance - Comments: Good balance seated and during transfers   Standing balance support: Bilateral upper extremity supported;During functional activity Standing balance-Leahy Scale: Poor Standing balance comment: RW and outside assist to steady                    Cognition Arousal/Alertness: Awake/alert Behavior During Therapy: WFL for tasks assessed/performed Overall Cognitive Status: Within Functional Limits for tasks assessed                      Exercises      General Comments        Pertinent Vitals/Pain Pain Assessment: 0-10 Pain Score: 4  Faces Pain Scale: Hurts even more Pain Location: scrotum Pain Descriptors / Indicators: Grimacing;Moaning Pain Intervention(s): Limited activity within patient's tolerance;Monitored during session;Repositioned    Home Living                      Prior Function            PT Goals (current goals can now be found in the care plan section) Acute Rehab PT Goals Patient Stated Goal: none stated PT Goal Formulation: With patient Time For Goal Achievement: 07/16/15 Potential to Achieve Goals: Good Progress towards PT goals: Progressing toward goals    Frequency  Min 3X/week    PT Plan Current plan remains appropriate    Co-evaluation             End of Session Equipment Utilized During Treatment: Gait belt Activity Tolerance:  Patient limited by pain;Patient tolerated treatment well Patient left: in chair;Other (comment) (w/ OT beginning treatment session)     Time: [id number] PT Time Calculation (min) (ACUTE ONLY): 17 min  Charges:  $Gait Training: 8-22 mins                    G Codes:      Collie Siad PT, DPT  Pager: [phone] Phone: [phone] 07/12/2015, 1:25 PM

## 2015-07-13 ENCOUNTER — Encounter (HOSPITAL_COMMUNITY)
Admission: EM | Disposition: A | Discharge status: Home Health | Payer: Self-pay | Source: Home / Self Care | Attending: Family Medicine

## 2015-07-13 ENCOUNTER — Encounter (HOSPITAL_COMMUNITY): Payer: Self-pay | Admitting: Internal Medicine

## 2015-07-13 DIAGNOSIS — I5041 Acute combined systolic (congestive) and diastolic (congestive) heart failure: Secondary | ICD-10-CM

## 2015-07-13 HISTORY — PX: CARDIAC CATHETERIZATION: SHX172

## 2015-07-13 LAB — RENAL FUNCTION PANEL
Albumin: 2.2 g/dL — ABNORMAL LOW (ref 3.5–5.0)
Anion gap: 11 (ref 5–15)
BUN: 19 mg/dL (ref 6–20)
CO2: 33 mmol/L — ABNORMAL HIGH (ref 22–32)
Calcium: 8.6 mg/dL — ABNORMAL LOW (ref 8.9–10.3)
Chloride: 99 mmol/L — ABNORMAL LOW (ref 101–111)
Creatinine, Ser: 1.24 mg/dL (ref 0.61–1.24)
GFR calc Af Amer: >60 mL/min (ref >60)
GFR calc non Af Amer: >60 mL/min (ref >60)
Glucose, Bld: 45 mg/dL — ABNORMAL LOW (ref 65–99)
Phosphorus: 5.3 mg/dL — ABNORMAL HIGH (ref 2.5–4.6)
Potassium: 3.6 mmol/L (ref 3.5–5.1)
Sodium: 143 mmol/L (ref 135–145)

## 2015-07-13 LAB — POCT I-STAT 3, ART BLOOD GAS (G3+)
Acid-Base Excess: 10 mmol/L — ABNORMAL HIGH (ref 0.0–2.0)
Bicarbonate: 34.9 mEq/L — ABNORMAL HIGH (ref 20.0–24.0)
O2 Saturation: 88 %
TCO2: 36 mmol/L (ref 0–100)
pCO2 arterial: 46.4 mmHg — ABNORMAL HIGH (ref 35.0–45.0)
pH, Arterial: 7.485 — ABNORMAL HIGH (ref 7.350–7.450)
pO2, Arterial: 51 mmHg — ABNORMAL LOW (ref 80.0–100.0)

## 2015-07-13 LAB — UIFE/LIGHT CHAINS/TP QN, 24-HR UR
% BETA, Urine: 14.6 %
ALPHA 1 URINE: 3.1 %
Albumin, U: 62.1 %
Alpha 2, Urine: 6.7 %
Free Kappa/Lambda Ratio: 16.23 — ABNORMAL HIGH (ref 2.04–10.37)
Free Lambda Lt Chains,Ur: 3.45 mg/L (ref 0.24–6.66)
Free Lt Chn Excr Rate: 56 mg/L — ABNORMAL HIGH (ref 1.35–24.19)
GAMMA GLOBULIN URINE: 13.5 %
Time: 24 hours
Total Protein, Urine-Ur/day: 884 mg/24 hr — ABNORMAL HIGH (ref 30–150)
Total Protein, Urine: 28.5 mg/dL
Volume, Urine: 3100 mL

## 2015-07-13 LAB — CREATININE, SERUM
Creatinine, Ser: 1.19 mg/dL (ref 0.61–1.24)
GFR calc Af Amer: >60 mL/min (ref >60)
GFR calc non Af Amer: >60 mL/min (ref >60)

## 2015-07-13 LAB — POCT I-STAT 3, VENOUS BLOOD GAS (G3P V)
Acid-Base Excess: 12 mmol/L — ABNORMAL HIGH (ref 0.0–2.0)
Acid-Base Excess: 13 mmol/L — ABNORMAL HIGH (ref 0.0–2.0)
Acid-Base Excess: 11 mmol/L — ABNORMAL HIGH (ref 0.0–2.0)
Bicarbonate: 37.5 mEq/L — ABNORMAL HIGH (ref 20.0–24.0)
Bicarbonate: 38.2 mEq/L — ABNORMAL HIGH (ref 20.0–24.0)
Bicarbonate: 36.6 mEq/L — ABNORMAL HIGH (ref 20.0–24.0)
O2 Saturation: 50 %
O2 Saturation: 57 %
O2 Saturation: 58 %
TCO2: 39 mmol/L (ref 0–100)
TCO2: 40 mmol/L (ref 0–100)
TCO2: 38 mmol/L (ref 0–100)
pCO2, Ven: 48.9 mmHg (ref 45.0–50.0)
pCO2, Ven: 49.7 mmHg (ref 45.0–50.0)
pCO2, Ven: 52.5 mmHg — ABNORMAL HIGH (ref 45.0–50.0)
pH, Ven: 7.492 — ABNORMAL HIGH (ref 7.250–7.300)
pH, Ven: 7.494 — ABNORMAL HIGH (ref 7.250–7.300)
pH, Ven: 7.451 — ABNORMAL HIGH (ref 7.250–7.300)
pO2, Ven: 25 mmHg — ABNORMAL LOW (ref 31.0–45.0)
pO2, Ven: 28 mmHg — ABNORMAL LOW (ref 31.0–45.0)
pO2, Ven: 30 mmHg — ABNORMAL LOW (ref 31.0–45.0)

## 2015-07-13 LAB — PROTIME-INR
INR: 1.42 (ref 0.00–1.49)
Prothrombin Time: 17.4 seconds — ABNORMAL HIGH (ref 11.6–15.2)

## 2015-07-13 LAB — CBC
HCT: 38.2 % — ABNORMAL LOW (ref 39.0–52.0)
Hemoglobin: 11.6 g/dL — ABNORMAL LOW (ref 13.0–17.0)
MCH: 23.2 pg — ABNORMAL LOW (ref 26.0–34.0)
MCHC: 30.4 g/dL (ref 30.0–36.0)
MCV: 76.2 fL — ABNORMAL LOW (ref 78.0–100.0)
Platelets: 326 10*3/uL (ref 150–400)
RBC: 5.01 MIL/uL (ref 4.22–5.81)
RDW: 15.8 % — ABNORMAL HIGH (ref 11.5–15.5)
WBC: 9.4 10*3/uL (ref 4.0–10.5)

## 2015-07-13 LAB — SURGICAL PCR SCREEN
MRSA, PCR: NEGATIVE
Staphylococcus aureus: POSITIVE — AB

## 2015-07-13 LAB — GLUCOSE, CAPILLARY
Glucose-Capillary: 60 mg/dL — ABNORMAL LOW (ref 65–99)
Glucose-Capillary: 119 mg/dL — ABNORMAL HIGH (ref 65–99)
Glucose-Capillary: 85 mg/dL (ref 65–99)
Glucose-Capillary: 69 mg/dL (ref 65–99)
Glucose-Capillary: 225 mg/dL — ABNORMAL HIGH (ref 65–99)
Glucose-Capillary: 138 mg/dL — ABNORMAL HIGH (ref 65–99)

## 2015-07-13 LAB — MAGNESIUM: Magnesium: 1.7 mg/dL (ref 1.7–2.4)

## 2015-07-13 SURGERY — RIGHT/LEFT HEART CATH AND CORONARY ANGIOGRAPHY

## 2015-07-13 MED ORDER — SODIUM CHLORIDE 0.9% FLUSH
3.0000 mL | Freq: Two times a day (BID) | INTRAVENOUS | Status: DC
  Administered 2015-07-13 – 2015-07-20 (×12): 3 mL via INTRAVENOUS

## 2015-07-13 MED ORDER — HEPARIN SODIUM (PORCINE) 1000 UNIT/ML IJ SOLN
INTRAMUSCULAR | Status: DC | PRN
  Administered 2015-07-13: 5000 [IU] via INTRAVENOUS

## 2015-07-13 MED ORDER — DEXTROSE 50 % IV SOLN
INTRAVENOUS | Status: AC
  Administered 2015-07-13: 25 mL
  Filled 2015-07-13: qty 50

## 2015-07-13 MED ORDER — LIDOCAINE HCL (PF) 1 % IJ SOLN
INTRAMUSCULAR | Status: DC | PRN
  Administered 2015-07-13 (×2): 2 mL

## 2015-07-13 MED ORDER — HEPARIN (PORCINE) IN NACL 2-0.9 UNIT/ML-% IJ SOLN
INTRAMUSCULAR | Status: DC | PRN
  Administered 2015-07-13: 13:00:00

## 2015-07-13 MED ORDER — SODIUM CHLORIDE 0.9% FLUSH
3.0000 mL | Freq: Two times a day (BID) | INTRAVENOUS | Status: DC
  Administered 2015-07-14 – 2015-07-17 (×5): 3 mL via INTRAVENOUS

## 2015-07-13 MED ORDER — ACETAMINOPHEN 325 MG PO TABS: 650.0000 mg | ORAL_TABLET | ORAL | Status: DC | PRN

## 2015-07-13 MED ORDER — INSULIN GLARGINE 100 UNIT/ML ~~LOC~~ SOLN
7.0000 [IU] | Freq: Every morning | SUBCUTANEOUS | Status: DC
  Filled 2015-07-13: qty 0.07

## 2015-07-13 MED ORDER — SODIUM CHLORIDE 0.9% FLUSH: 3.0000 mL | INTRAVENOUS | Status: DC | PRN

## 2015-07-13 MED ORDER — SODIUM CHLORIDE 0.9 % IV SOLN: 250.0000 mL | INTRAVENOUS | Status: DC | PRN

## 2015-07-13 MED ORDER — ONDANSETRON HCL 4 MG/2ML IJ SOLN: 4.0000 mg | Freq: Four times a day (QID) | INTRAMUSCULAR | Status: DC | PRN

## 2015-07-13 MED ORDER — PRO-STAT SUGAR FREE PO LIQD
30.0000 mL | Freq: Every day | ORAL | Status: DC
  Administered 2015-07-13 – 2015-07-19 (×7): 30 mL via ORAL
  Filled 2015-07-13 (×8): qty 30

## 2015-07-13 MED ORDER — JUVEN PO PACK
1.0000 | PACK | Freq: Two times a day (BID) | ORAL | Status: DC
  Administered 2015-07-13 – 2015-07-15 (×4): 1 via ORAL
  Filled 2015-07-13 (×7): qty 1

## 2015-07-13 MED ORDER — HEPARIN (PORCINE) IN NACL 2-0.9 UNIT/ML-% IJ SOLN
INTRAMUSCULAR | Status: DC | PRN
  Administered 2015-07-13: 1000 mL

## 2015-07-13 MED ORDER — ADULT MULTIVITAMIN W/MINERALS CH
1.0000 | ORAL_TABLET | Freq: Every day | ORAL | Status: DC
  Administered 2015-07-13 – 2015-07-20 (×8): 1 via ORAL
  Filled 2015-07-13 (×8): qty 1

## 2015-07-13 MED ORDER — VERAPAMIL HCL 2.5 MG/ML IV SOLN
INTRAVENOUS | Status: DC | PRN
  Administered 2015-07-13: 10 mL via INTRA_ARTERIAL

## 2015-07-13 MED ORDER — LIDOCAINE HCL (PF) 1 % IJ SOLN
INTRAMUSCULAR | Status: AC
  Filled 2015-07-13: qty 30

## 2015-07-13 MED ORDER — IOPAMIDOL (ISOVUE-370) INJECTION 76%
INTRAVENOUS | Status: DC | PRN
  Administered 2015-07-13: 40 mL via INTRA_ARTERIAL

## 2015-07-13 MED ORDER — IOPAMIDOL (ISOVUE-370) INJECTION 76%
INTRAVENOUS | Status: AC
  Filled 2015-07-13: qty 100

## 2015-07-13 MED ORDER — HEPARIN SODIUM (PORCINE) 1000 UNIT/ML IJ SOLN
INTRAMUSCULAR | Status: AC
  Filled 2015-07-13: qty 1

## 2015-07-13 MED ORDER — HEPARIN SODIUM (PORCINE) 5000 UNIT/ML IJ SOLN: 5000.0000 [IU] | Freq: Three times a day (TID) | INTRAMUSCULAR | Status: DC

## 2015-07-13 MED ORDER — ISOSORBIDE MONONITRATE ER 30 MG PO TB24
30.0000 mg | ORAL_TABLET | Freq: Every day | ORAL | Status: DC
  Administered 2015-07-14 – 2015-07-20 (×7): 30 mg via ORAL
  Filled 2015-07-13 (×7): qty 1

## 2015-07-13 MED ORDER — HEPARIN (PORCINE) IN NACL 2-0.9 UNIT/ML-% IJ SOLN
INTRAMUSCULAR | Status: AC
  Filled 2015-07-13: qty 1000

## 2015-07-13 MED ORDER — VERAPAMIL HCL 2.5 MG/ML IV SOLN
INTRAVENOUS | Status: AC
  Filled 2015-07-13: qty 2

## 2015-07-13 MED ORDER — MAGNESIUM SULFATE 2 GM/50ML IV SOLN
2.0000 g | Freq: Once | INTRAVENOUS | Status: AC
  Administered 2015-07-13: 2 g via INTRAVENOUS
  Filled 2015-07-13: qty 50

## 2015-07-13 SURGICAL SUPPLY — 13 items
CATH BALLN WEDGE 5F 110CM (CATHETERS) ×1 IMPLANT
CATH INFINITI 5 FR JL3.5 (CATHETERS) ×1 IMPLANT
CATH INFINITI 5FR ANG PIGTAIL (CATHETERS) ×1 IMPLANT
CATH INFINITI JR4 5F (CATHETERS) ×1 IMPLANT
GLIDESHEATH SLEND SS 6F .021 (SHEATH) ×1 IMPLANT
KIT HEART LEFT (KITS) ×2 IMPLANT
PACK CARDIAC CATHETERIZATION (CUSTOM PROCEDURE TRAY) ×2 IMPLANT
SHEATH FAST CATH BRACH 5F 5CM (SHEATH) ×1 IMPLANT
TRANSDUCER W/STOPCOCK (MISCELLANEOUS) ×3 IMPLANT
TUBING CIL FLEX 10 FLL-RA (TUBING) ×2 IMPLANT
WIRE EMERALD 3MM-J .025X260CM (WIRE) ×1 IMPLANT
WIRE HI TORQ VERSACORE-J 145CM (WIRE) ×1 IMPLANT
WIRE SAFE-T 1.5MM-J .035X260CM (WIRE) ×1 IMPLANT

## 2015-07-13 NOTE — Interval H&P Note (Signed)
History and Physical Interval Note:  07/13/2015 11:56 AM  [name]  has presented today for surgery, with the diagnosis of chf  The various methods of treatment have been discussed with the patient and family. After consideration of risks, benefits and other options for treatment, the patient has consented to  Procedure(s): Right/Left Heart Cath and Coronary Angiography (N/A) and possible angioplasty as a surgical intervention .  The patient's history has been reviewed, patient examined, no change in status, stable for surgery.  I have reviewed the patient's chart and labs.  Questions were answered to the patient's satisfaction.     Bensimhon, Quillian Quince

## 2015-07-13 NOTE — H&P (View-Only) (Signed)
Patient Name: [name] Date of Encounter: 07/13/2015    Patient Profile: [name] is a 60 year old male with a past medical history of HTN, DM. S/p right BKA. Presented to the ED with scrotal swelling and lower extremity edema. BNP was 1764, troponin was 0.21. Echo on 07/10/15 shows EF of 15%, diffuse hypokinesis, PA pressure elevated at 21mmHg.   SUBJECTIVE: Feels well today, breathing better. No chest pain, denies orthopnea.   OBJECTIVE Filed Vitals:   07/12/15 0100 07/12/15 0638 07/12/15 2049 07/13/15 0448  BP: 140/97 138/94 126/96 124/88  Pulse: 70 75 63 59  Temp: 98.4 F (36.9 C) 98.9 F (37.2 C) 98.2 F (36.8 C) 98 F (36.7 C)  TempSrc: Oral Oral Oral Oral  Resp: 18 18 18 18   Height:      Weight:  223 lb 8.7 oz (101.4 kg)  220 lb 14.4 oz (100.2 kg)  SpO2: 92% 95% 97% 97%    Intake/Output Summary (Last 24 hours) at 07/13/15 0815 Last data filed at 07/13/15 0600  Gross per 24 hour  Intake    720 ml  Output   3575 ml  Net  -2855 ml   Filed Weights   07/11/15 0416 07/12/15 0638 07/13/15 0448  Weight: 225 lb 5 oz (102.2 kg) 223 lb 8.7 oz (101.4 kg) 220 lb 14.4 oz (100.2 kg)    PHYSICAL EXAM General: Well developed, well nourished, male in no acute distress. Head: Normocephalic, atraumatic.  Neck: Supple without bruits, jaw JVD. Lungs:  Resp regular and unlabored, diminished in bases, CTA in upper lobes  Heart: RRR, S1, S2, no S3, S4, or murmur; no rub. Abdomen: Soft, non-tender, non-distended, BS + x 4.  Extremities: No clubbing, cyanosis, +2 RLE edema.  Neuro: Alert and oriented X 3. R BKA.  Psych: Normal affect.  LABS: CBC: Recent Labs  07/11/15 0533  WBC 10.3  HGB 12.1*  HCT 38.2*  MCV 74.3*  PLT 353   INR: Recent Labs  07/13/15 0454  INR 123XX123   Basic Metabolic Panel: Recent Labs  07/11/15 0335 07/12/15 0525 07/12/15 1030 07/13/15 0332  NA 142 141  --  143  K 3.7 3.8  --  3.6  CL 101 97*  --  99*  CO2 30 30  --  33*    GLUCOSE 112* 87  --  45*  BUN 20 18  --  19  CREATININE 1.20 1.22  --  1.24  CALCIUM 8.7* 8.6*  --  8.6*  MG 1.6*  --  1.6*  --   PHOS  --  5.1*  --  5.3*   Liver Function Tests: Recent Labs  07/12/15 0525 07/13/15 0332  ALBUMIN 2.3* 2.2*   BNP:  B NATRIURETIC PEPTIDE  Date/Time Value Ref Range Status  07/08/2015 06:25 PM 1764.5* 0.0 - 100.0 pg/mL Final   Fasting Lipid Panel: Recent Labs  07/10/15 1025  CHOL 96  HDL 32*  LDLCALC 55  TRIG 47  CHOLHDL 3.0   Thyroid Function Tests: Recent Labs  07/10/15 1025  TSH 0.952     Current facility-administered medications:  .  0.9 %  sodium chloride infusion, , Intravenous, Continuous, Arbutus Leas, NP .  acetaminophen (TYLENOL) tablet 650 mg, 650 mg, Oral, Q6H PRN **OR** acetaminophen (TYLENOL) suppository 650 mg, 650 mg, Rectal, Q6H PRN, Carlyle Dolly, MD .  aspirin EC tablet 81 mg, 81 mg, Oral, Daily, Carlyle Dolly, MD, 81 mg at 07/12/15 1110 .  atorvastatin (LIPITOR) tablet 40 mg, 40 mg, Oral, q1800, Aquilla Hacker, MD, 40 mg at 07/12/15 1804 .  brimonidine (ALPHAGAN) 0.2 % ophthalmic solution 1 drop, 1 drop, Left Eye, BID, Carlyle Dolly, MD, 1 drop at 07/12/15 2144 .  carvedilol (COREG) tablet 25 mg, 25 mg, Oral, BID WC, Sueanne Margarita, MD, 25 mg at 07/13/15 0618 .  dorzolamide-timolol (COSOPT) 22.3-6.8 MG/ML ophthalmic solution 1 drop, 1 drop, Left Eye, BID, Carlyle Dolly, MD, 1 drop at 07/12/15 2144 .  furosemide (LASIX) injection 80 mg, 80 mg, Intravenous, BID, Arbutus Leas, NP, 80 mg at 07/13/15 0811 .  heparin injection 5,000 Units, 5,000 Units, Subcutaneous, Q8H, Arbutus Leas, NP, 5,000 Units at 07/12/15 2144 .  hydrALAZINE (APRESOLINE) tablet 25 mg, 25 mg, Oral, Q8H, Aquilla Hacker, MD, 25 mg at 07/13/15 0618 .  HYDROcodone-acetaminophen (NORCO/VICODIN) 5-325 MG per tablet 1 tablet, 1 tablet, Oral, Q6H PRN, Carlyle Dolly, MD, 1 tablet at 07/11/15 2306 .  insulin aspart (novoLOG)  injection 0-9 Units, 0-9 Units, Subcutaneous, TID WC, Janora Norlander, DO, 3 Units at 07/12/15 1806 .  insulin aspart (novoLOG) injection 3 Units, 3 Units, Subcutaneous, TID WC, Aquilla Hacker, MD, 3 Units at 07/12/15 1805 .  insulin glargine (LANTUS) injection 10 Units, 10 Units, Subcutaneous, q morning - 10a, Carlyle Dolly, MD, 10 Units at 07/12/15 1108 .  sodium chloride flush (NS) 0.9 % injection 3 mL, 3 mL, Intravenous, Q12H, Dickie La, MD, 3 mL at 07/12/15 2145 .  sodium chloride flush (NS) 0.9 % injection 3 mL, 3 mL, Intravenous, PRN, Dickie La, MD .  spironolactone (ALDACTONE) tablet 12.5 mg, 12.5 mg, Oral, Daily, Sueanne Margarita, MD, 12.5 mg at 07/12/15 1205 .  tamsulosin (FLOMAX) capsule 0.4 mg, 0.4 mg, Oral, QPC supper, Aquilla Hacker, MD, 0.4 mg at 07/12/15 1804 .  tetrahydrozoline 0.05 % ophthalmic solution 1 drop, 1 drop, Left Eye, BID, Carlyle Dolly, MD, 1 drop at 07/12/15 2143 . sodium chloride     TELE:NSR  ECG: NSR, T wave inversion in lateral leads.   Echo 07/10/15 - Left ventricle: The cavity size was mildly dilated. Wall  thickness was increased in a pattern of mild LVH. Systolic  function was severely reduced. The estimated ejection fraction  was 15%. Diffuse hypokinesis. Doppler parameters are consistent  with restrictive physiology, indicative of decreased left  ventricular diastolic compliance and/or increased left atrial  pressure. Doppler parameters are consistent with high ventricular  filling pressure. - Mitral valve: There was mild regurgitation. - Left atrium: The atrium was mildly dilated. - Right ventricle: The cavity size was mildly dilated. Systolic  function was severely reduced. - Right atrium: The atrium was mildly dilated. - Tricuspid valve: There was moderate-severe regurgitation. - Pulmonary arteries: Systolic pressure was severely increased. PA  peak pressure: 67 mm Hg (S). - Pericardium, extracardiac: A small  pericardial effusion was  identified.  Impressions:  - Severe global reduction in LV function; restrictive filling; 4  chamber enlargement; severely reduced RV function; mild MR;  moderate to severe TR; severely elevated pulmonary pressue.   Current Medications:  . aspirin EC  81 mg Oral Daily  . atorvastatin  40 mg Oral q1800  . brimonidine  1 drop Left Eye BID  . carvedilol  25 mg Oral BID WC  . dorzolamide-timolol  1 drop Left Eye BID  . furosemide  80 mg Intravenous BID  . heparin subcutaneous  5,000 Units  Subcutaneous Q8H  . hydrALAZINE  25 mg Oral Q8H  . insulin aspart  0-9 Units Subcutaneous TID WC  . insulin aspart  3 Units Subcutaneous TID WC  . insulin glargine  10 Units Subcutaneous q morning - 10a  . sodium chloride flush  3 mL Intravenous Q12H  . spironolactone  12.5 mg Oral Daily  . tamsulosin  0.4 mg Oral QPC supper  . tetrahydrozoline  1 drop Left Eye BID   . sodium chloride      ASSESSMENT AND PLAN: Principal Problem:   Acute systolic CHF (congestive heart failure) (HCC) Active Problems:   HYPERTENSION, BENIGN SYSTEMIC   CHF (congestive heart failure) (HCC)   Dyspnea   Pressure ulcer   Elevated troponin   Scrotal swelling   Congestive dilated cardiomyopathy (HCC)   Protein calorie malnutrition ([place])  1. Acute systolic CHF: Patient presented with cough for 2 months, SOB, scrotal and lower extremity edema. BNP elevated on admission at 1764.5. Patient's echo shows reduced EF of 15% and diffuse hypokinesis. PA pressure elevated at 67. Started on 80mg  IV Lasix BID on 07/11/15. Spiro added on 07/11/15.   Has diuresed well, net neg 10.7 L since admission 5 days ago. Weight down 3 pounds from yesterday. His cough has resolved, and is breathing much better. Creatinine stable at 1.24. BUN 19.   He reports eating hamburgers and canned fish at home, educated about sodium restrictions. Suspect LV dysfunction secondary to hypertensive CM as he had stopped his BP  meds some time ago.   Patient is on Hydralazine 25mg  TID and optimal dose of Coreg as well as aldactone.  Will consider adding ACE I post cath if renal function stable.   Add Imdur 30mg  daily for preload reduction and consider changing to Bidil at discharge.  2. HTN: patient was hypertensive at admission, he stopped taking all of his BP meds after his R BKA he says at the advice of his MD.  SBP's 150's on admission, improved with increased Coreg and addition of hydralazine.    3. Dilated cardiomyopathy: Patient has diffuse hypokinesis on echo with EF 15%. Suspect this may be due to hypertensive CM since he stopped all BP meds a few months ago and diastolic BP was elevated in 100's on admission.Needs aggressive BP control and counseling on medication.  Educated about restricted sodium diet compliance. He does have DM, dyslipidemia and HTN as well as PVD and a history of tobacco use so need to rule ischemia as possible etiology of severe LV dysfunction. Will plan right and left heart cath today.   4. 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 Will plan to continue diuresis. Right heart cath today to assess.   5. DM: Management per [facility].   Signed, Arbutus Leas , NP 8:15 AM 07/13/2015 Pager [phone]  Patient seen and independently examined with Jettie Booze, NP. We discussed all aspects of the encounter. I agree with the assessment and plan as stated above with some modifications.  Patient has had excellent diuresis on current dose of IV lasix and aldactone.  He now on max dose Coreg and Hydralazine added for both BP control as well as afterload reduction.  Will add imdur today for preload reduction and consider adding ACE I post cath if renal function remains stable. May consider changing to Bidil at  time of discharge.  Will plan right and left  heart cath today to assess coronary anatomy and pulmonary pressure after aggressive diuresis.  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  Hopefully this will have improved from echo with aggressive diuresis.    Signed: Fransico Him, MD  [facility] 07/13/2015

## 2015-07-13 NOTE — Progress Notes (Signed)
Cardiac Cath Findings:  Ao = 135/87 (107) LV = 141/20/30 RA = 17 RV = 67/11/20 PA = 62/29 (41) PCW = 35 (v wave 45-50) Fick cardiac output/index = 6.0/2.7 PVR = 1.0 WU SVR = 1198  FA sat = 88% PA sat = 58%, 57%  Assessment: 1. 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 Severe NICM with EF 15% by echo 3. Markedly elevated filling pressures with normal PVR 4. Normal cardiac output  Plan/Discussion:  He has severe NICM. Still markedly volume overload. Continue diuresis. Pulmonary HTN is all left-sided. PA pressures should normalize with diuresis.   Bensimhon, Daniel,MD 12:38 PM

## 2015-07-13 NOTE — Progress Notes (Addendum)
Initial Nutrition Assessment  INTERVENTION:  Provide Multivitamin with minerals daily Recommend providing 500 mg of Vitamin C BID for 10 days Provide 30 ml of pro-stat daily, each dose provides 15 grams of protein and 100 kcal Provide Juven BID   NUTRITION DIAGNOSIS:   Increased nutrient needs related to wound healing as evidenced by estimated needs.   GOAL:   Patient will meet greater than or equal to 90% of their needs   MONITOR:   PO intake, Supplement acceptance, Labs, Weight trends, Skin, I & O's  REASON FOR ASSESSMENT:   Consult Assessment of nutrition requirement/status  ASSESSMENT:   60 year old male with a past medical history of HTN, DM. S/p right BKA. Presented to the ED with scrotal swelling and lower extremity edema.  Pt reports eating well PTA with 3 meals daily, with intake of protein at every meal and fruits and vegetables daily. He states that for the past week he has had a poor appetite, eating 50% less than usual, and reports losing from 230 lbs to 220 lbs in the past week. Per nursing notes, pt is eating 75-100% of meals. Per weight history, pt has been weighing around 215 lbs- no evidence of recent weight loss. Pt has pressure ulcers on legs that he states have been present for a few weeks. He has some mild muscle wasting, but otherwise appears well-nourished.  Emphasized the importance of adequate nutrition intake. Pt agreeable to receiving snacks, protein supplements, and multivitamin.   Labs: low calcium, elevated phosphorus, glucose ranging 60-210 mg/dL  Diet Order:  Diet clear liquid Room service appropriate?: Yes; Fluid consistency:: Thin  Skin:  Wound (see comment) (unstageable pressure ulcers on right and left knee)  Last BM:  5/10  Height:   Ht Readings from Last 1 Encounters:  07/09/15 6' (1.829 m)    Weight:   Wt Readings from Last 1 Encounters:  07/13/15 220 lb 14.4 oz (100.2 kg)    Ideal Body Weight:  75.6 kg  BMI:  Body mass  index is 29.95 kg/(m^2).  Estimated Nutritional Needs:   Kcal:  2200-2500  Protein:  120-130 grams  Fluid:  2.2-2.5 L/day  EDUCATION NEEDS:   No education needs identified at this time  [place], LDN Inpatient Clinical Dietitian Pager: [phone] After Hours Pager: [phone]

## 2015-07-13 NOTE — Progress Notes (Signed)
CBG 69.  8oz cranberry juice given.

## 2015-07-13 NOTE — Progress Notes (Signed)
Inpatient Diabetes Program Recommendations  AACE/ADA: New Consensus Statement on Inpatient Glycemic Control (2015)  Target Ranges:  Prepandial:   less than 140 mg/dL      Peak postprandial:   less than 180 mg/dL (1-2 hours)      Critically ill patients:  140 - 180 mg/dL   Review of Glycemic Control  Inpatient Diabetes Program Recommendations:  Insulin - Basal: May not need basal insulin at this time Insulin - Meal Coverage: noted discontinued Thank you  Raoul Pitch BSN, RN,CDE Inpatient Diabetes Coordinator [phone] (team pager)

## 2015-07-13 NOTE — Progress Notes (Signed)
Patient Name: [name] Date of Encounter: 07/13/2015    Patient Profile: [name] is a 60 year old male with a past medical history of HTN, DM. S/p right BKA. Presented to the ED with scrotal swelling and lower extremity edema. BNP was 1764, troponin was 0.21. Echo on 07/10/15 shows EF of 15%, diffuse hypokinesis, PA pressure elevated at 65mmHg.   SUBJECTIVE: Feels well today, breathing better. No chest pain, denies orthopnea.   OBJECTIVE Filed Vitals:   07/12/15 0100 07/12/15 0638 07/12/15 2049 07/13/15 0448  BP: 140/97 138/94 126/96 124/88  Pulse: 70 75 63 59  Temp: 98.4 F (36.9 C) 98.9 F (37.2 C) 98.2 F (36.8 C) 98 F (36.7 C)  TempSrc: Oral Oral Oral Oral  Resp: 18 18 18 18   Height:      Weight:  223 lb 8.7 oz (101.4 kg)  220 lb 14.4 oz (100.2 kg)  SpO2: 92% 95% 97% 97%    Intake/Output Summary (Last 24 hours) at 07/13/15 0815 Last data filed at 07/13/15 0600  Gross per 24 hour  Intake    720 ml  Output   3575 ml  Net  -2855 ml   Filed Weights   07/11/15 0416 07/12/15 0638 07/13/15 0448  Weight: 225 lb 5 oz (102.2 kg) 223 lb 8.7 oz (101.4 kg) 220 lb 14.4 oz (100.2 kg)    PHYSICAL EXAM General: Well developed, well nourished, male in no acute distress. Head: Normocephalic, atraumatic.  Neck: Supple without bruits, jaw JVD. Lungs:  Resp regular and unlabored, diminished in bases, CTA in upper lobes  Heart: RRR, S1, S2, no S3, S4, or murmur; no rub. Abdomen: Soft, non-tender, non-distended, BS + x 4.  Extremities: No clubbing, cyanosis, +2 RLE edema.  Neuro: Alert and oriented X 3. R BKA.  Psych: Normal affect.  LABS: CBC: Recent Labs  07/11/15 0533  WBC 10.3  HGB 12.1*  HCT 38.2*  MCV 74.3*  PLT 353   INR: Recent Labs  07/13/15 0454  INR 123XX123   Basic Metabolic Panel: Recent Labs  07/11/15 0335 07/12/15 0525 07/12/15 1030 07/13/15 0332  NA 142 141  --  143  K 3.7 3.8  --  3.6  CL 101 97*  --  99*  CO2 30 30  --  33*    GLUCOSE 112* 87  --  45*  BUN 20 18  --  19  CREATININE 1.20 1.22  --  1.24  CALCIUM 8.7* 8.6*  --  8.6*  MG 1.6*  --  1.6*  --   PHOS  --  5.1*  --  5.3*   Liver Function Tests: Recent Labs  07/12/15 0525 07/13/15 0332  ALBUMIN 2.3* 2.2*   BNP:  B NATRIURETIC PEPTIDE  Date/Time Value Ref Range Status  07/08/2015 06:25 PM 1764.5* 0.0 - 100.0 pg/mL Final   Fasting Lipid Panel: Recent Labs  07/10/15 1025  CHOL 96  HDL 32*  LDLCALC 55  TRIG 47  CHOLHDL 3.0   Thyroid Function Tests: Recent Labs  07/10/15 1025  TSH 0.952     Current facility-administered medications:  .  0.9 %  sodium chloride infusion, , Intravenous, Continuous, Arbutus Leas, NP .  acetaminophen (TYLENOL) tablet 650 mg, 650 mg, Oral, Q6H PRN **OR** acetaminophen (TYLENOL) suppository 650 mg, 650 mg, Rectal, Q6H PRN, Carlyle Dolly, MD .  aspirin EC tablet 81 mg, 81 mg, Oral, Daily, Carlyle Dolly, MD, 81 mg at 07/12/15 1110 .  atorvastatin (LIPITOR) tablet 40 mg, 40 mg, Oral, q1800, Aquilla Hacker, MD, 40 mg at 07/12/15 1804 .  brimonidine (ALPHAGAN) 0.2 % ophthalmic solution 1 drop, 1 drop, Left Eye, BID, Carlyle Dolly, MD, 1 drop at 07/12/15 2144 .  carvedilol (COREG) tablet 25 mg, 25 mg, Oral, BID WC, Sueanne Margarita, MD, 25 mg at 07/13/15 0618 .  dorzolamide-timolol (COSOPT) 22.3-6.8 MG/ML ophthalmic solution 1 drop, 1 drop, Left Eye, BID, Carlyle Dolly, MD, 1 drop at 07/12/15 2144 .  furosemide (LASIX) injection 80 mg, 80 mg, Intravenous, BID, Arbutus Leas, NP, 80 mg at 07/13/15 0811 .  heparin injection 5,000 Units, 5,000 Units, Subcutaneous, Q8H, Arbutus Leas, NP, 5,000 Units at 07/12/15 2144 .  hydrALAZINE (APRESOLINE) tablet 25 mg, 25 mg, Oral, Q8H, Aquilla Hacker, MD, 25 mg at 07/13/15 0618 .  HYDROcodone-acetaminophen (NORCO/VICODIN) 5-325 MG per tablet 1 tablet, 1 tablet, Oral, Q6H PRN, Carlyle Dolly, MD, 1 tablet at 07/11/15 2306 .  insulin aspart (novoLOG)  injection 0-9 Units, 0-9 Units, Subcutaneous, TID WC, Janora Norlander, DO, 3 Units at 07/12/15 1806 .  insulin aspart (novoLOG) injection 3 Units, 3 Units, Subcutaneous, TID WC, Aquilla Hacker, MD, 3 Units at 07/12/15 1805 .  insulin glargine (LANTUS) injection 10 Units, 10 Units, Subcutaneous, q morning - 10a, Carlyle Dolly, MD, 10 Units at 07/12/15 1108 .  sodium chloride flush (NS) 0.9 % injection 3 mL, 3 mL, Intravenous, Q12H, Dickie La, MD, 3 mL at 07/12/15 2145 .  sodium chloride flush (NS) 0.9 % injection 3 mL, 3 mL, Intravenous, PRN, Dickie La, MD .  spironolactone (ALDACTONE) tablet 12.5 mg, 12.5 mg, Oral, Daily, Sueanne Margarita, MD, 12.5 mg at 07/12/15 1205 .  tamsulosin (FLOMAX) capsule 0.4 mg, 0.4 mg, Oral, QPC supper, Aquilla Hacker, MD, 0.4 mg at 07/12/15 1804 .  tetrahydrozoline 0.05 % ophthalmic solution 1 drop, 1 drop, Left Eye, BID, Carlyle Dolly, MD, 1 drop at 07/12/15 2143 . sodium chloride     TELE:NSR  ECG: NSR, T wave inversion in lateral leads.   Echo 07/10/15 - Left ventricle: The cavity size was mildly dilated. Wall  thickness was increased in a pattern of mild LVH. Systolic  function was severely reduced. The estimated ejection fraction  was 15%. Diffuse hypokinesis. Doppler parameters are consistent  with restrictive physiology, indicative of decreased left  ventricular diastolic compliance and/or increased left atrial  pressure. Doppler parameters are consistent with high ventricular  filling pressure. - Mitral valve: There was mild regurgitation. - Left atrium: The atrium was mildly dilated. - Right ventricle: The cavity size was mildly dilated. Systolic  function was severely reduced. - Right atrium: The atrium was mildly dilated. - Tricuspid valve: There was moderate-severe regurgitation. - Pulmonary arteries: Systolic pressure was severely increased. PA  peak pressure: 67 mm Hg (S). - Pericardium, extracardiac: A small  pericardial effusion was  identified.  Impressions:  - Severe global reduction in LV function; restrictive filling; 4  chamber enlargement; severely reduced RV function; mild MR;  moderate to severe TR; severely elevated pulmonary pressue.   Current Medications:  . aspirin EC  81 mg Oral Daily  . atorvastatin  40 mg Oral q1800  . brimonidine  1 drop Left Eye BID  . carvedilol  25 mg Oral BID WC  . dorzolamide-timolol  1 drop Left Eye BID  . furosemide  80 mg Intravenous BID  . heparin subcutaneous  5,000 Units  Subcutaneous Q8H  . hydrALAZINE  25 mg Oral Q8H  . insulin aspart  0-9 Units Subcutaneous TID WC  . insulin aspart  3 Units Subcutaneous TID WC  . insulin glargine  10 Units Subcutaneous q morning - 10a  . sodium chloride flush  3 mL Intravenous Q12H  . spironolactone  12.5 mg Oral Daily  . tamsulosin  0.4 mg Oral QPC supper  . tetrahydrozoline  1 drop Left Eye BID   . sodium chloride      ASSESSMENT AND PLAN: Principal Problem:   Acute systolic CHF (congestive heart failure) (HCC) Active Problems:   HYPERTENSION, BENIGN SYSTEMIC   CHF (congestive heart failure) (HCC)   Dyspnea   Pressure ulcer   Elevated troponin   Scrotal swelling   Congestive dilated cardiomyopathy (HCC)   Protein calorie malnutrition ([place])  1. Acute systolic CHF: Patient presented with cough for 2 months, SOB, scrotal and lower extremity edema. BNP elevated on admission at 1764.5. Patient's echo shows reduced EF of 15% and diffuse hypokinesis. PA pressure elevated at 67. Started on 80mg  IV Lasix BID on 07/11/15. Spiro added on 07/11/15.   Has diuresed well, net neg 10.7 L since admission 5 days ago. Weight down 3 pounds from yesterday. His cough has resolved, and is breathing much better. Creatinine stable at 1.24. BUN 19.   He reports eating hamburgers and canned fish at home, educated about sodium restrictions. Suspect LV dysfunction secondary to hypertensive CM as he had stopped his BP  meds some time ago.   Patient is on Hydralazine 25mg  TID and optimal dose of Coreg as well as aldactone.  Will consider adding ACE I post cath if renal function stable.   Add Imdur 30mg  daily for preload reduction and consider changing to Bidil at discharge.  2. HTN: patient was hypertensive at admission, he stopped taking all of his BP meds after his R BKA he says at the advice of his MD.  SBP's 150's on admission, improved with increased Coreg and addition of hydralazine.    3. Dilated cardiomyopathy: Patient has diffuse hypokinesis on echo with EF 15%. Suspect this may be due to hypertensive CM since he stopped all BP meds a few months ago and diastolic BP was elevated in 100's on admission.Needs aggressive BP control and counseling on medication.  Educated about restricted sodium diet compliance. He does have DM, dyslipidemia and HTN as well as PVD and a history of tobacco use so need to rule ischemia as possible etiology of severe LV dysfunction. Will plan right and left heart cath today.   4. 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 Will plan to continue diuresis. Right heart cath today to assess.   5. DM: Management per [facility].   Signed, Arbutus Leas , NP 8:15 AM 07/13/2015 Pager [phone]  Patient seen and independently examined with Jettie Booze, NP. We discussed all aspects of the encounter. I agree with the assessment and plan as stated above with some modifications.  Patient has had excellent diuresis on current dose of IV lasix and aldactone.  He now on max dose Coreg and Hydralazine added for both BP control as well as afterload reduction.  Will add imdur today for preload reduction and consider adding ACE I post cath if renal function remains stable. May consider changing to Bidil at  time of discharge.  Will plan right and left  heart cath today to assess coronary anatomy and pulmonary pressure after aggressive diuresis.  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  Hopefully this will have improved from echo with aggressive diuresis.    Signed: Fransico Him, MD  [facility] 07/13/2015

## 2015-07-13 NOTE — Progress Notes (Signed)
Family Medicine Teaching Service Daily Progress Note Intern Pager: [phone]  Patient name: [name] Medical record number: [id number] Date of birth: Nov 05, 1955 Age: 60 y.o. Gender: male  Primary Care Provider: Tawanna Sat, MD Consultants: none Code Status: Full   Pt Overview and Major Events to Date:  5/5: admitted for new onset HF   Assessment and Plan: DAEKWON FOUGHT is a 60 y.o. male presenting with new onset CHF . PMH is significant for hypertension, schizophrenia, diabetes mellitus with peripheral neuropathy.   CHF:  New onset. 4 chamber enlargement with global hypokinesia. HFrEF15%. 4050cc out. Weight downtrending. Anasarca improving. -May proceed to cath today, awaiting results - Cards recs appreciated:  - Continue coreg 25 mg BID, spironolactone 12.5mg  daily, Hydralazine 25mg  TID  - Continue Lasix 80mg  IV BID. Follow  BMET   - Holding lisinopril restart tomorrow.  - Risk Strat:  - Lipids, LDL 55 HDL slightly low at 35, otherwise wnl   - TSH 0.952  - A1C 12.3  - Lipitor 40.  - ASA 81 mg daily - d/c foley after cath  HTN: Improved with Hydralazine and Coreg. Will monitor with restarting lisinopril.  - Lisinopril 5 mg QD held as above.  - Hydralazine 25mg  TID  - lisinopril tomorrow.   Scrotal/penile swelling: Slight improvement. Able to visualize glans now without exam.  -Scrotal support ordered - Foley catheter in place. Attempt to remove today after cath.   T2DM, uncontrolled: Last A1c 12.3 on 07/06/15. Patient admits to taking Lantus 1-2/week and Novolog 1-2x/week.  - CBG 45-->60-->119 this morning -Hypoglycemia overnight after 3 U novolog w/ sxs including blurry vision: d/c mealtime insulin. Reduce Lantus to 7 units.  - Continue to monitor CBGs  Protein Calorie Malnutrition -Albumin 2.2 -RD consulted -Ensure patient dietary education prior to d/c   Hypomagnesemia: Goal Mg>2 - Repleting this am, and checking mag level.   Schizophrenia - Patient  alert, oriented and no signs of hallucinations or delusions on exam  - Consider psych consult for potential maximization of hospital care and medication compliance  Constipation. Resolved - If recurrs, consider magnesium citrate to stimulate BM with additional benefit of increasing magnesium level through some absorption.   Knee wounds and S/p R BKA. Patient has been crawling around on his knees at home to get around. Not infected.  Not painful.  - Amputation and previous surgical care with ortho.  - HH/ Care Management at d/c to make sure he has appropriate help and ambulatory devices at home.   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 Mild.  - ACE I - holding for now.  - Follow as outpatient. / Workup as indicated.   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 Pupil non-reactive and dilated. Followed by ophtho as an outpatient. - Continue home Brimodine, Cosopt, and visine drops in left eye.   Protein Calorie Malnutrition - Moderate.  - Nutrition consulted.   FEN/GI: Heart health/carb modified, KVO  PPx: Sub Q heparin  Disposition: pending improvement   Subjective:  Feels better today than yesterday.  He feels that his scrotal swelling is minimally improved from yesterday; still uncomfortable. Asked if there is a quick fix for it and if we could do that. Only thing that bothers him.  Endorses some leg pain above the knees. Specifically denies SOB, Orthopnea, and cough (improved from yest), and CP.  Of note, he asked about his R leg stump- it has been too swollen to put on his prosthetic leg  on. Asked what he can do to bring swelling down, including adding a "wrap".   Objective: Temp:  [98 F (36.7 C)-98.2 F (36.8 C)] 98 F (36.7 C) (05/10 0448) Pulse Rate:  [59-63] 59 (05/10 0448) Resp:  [18] 18 (05/10 0448) BP: (124-126)/(88-96) 124/88 mmHg (05/10 0448) SpO2:  [97 %] 97 % (05/10 0448) Weight:  [100.2 kg (220 lb 14.4 oz)] 100.2 kg (220 lb  14.4 oz) (05/10 0448)  Physical Exam: General: NAD, sitting in bed watching tv.  Cardiovascular: irregular rhythm- not on a-fib, S1S2 and intermittent S3, systolic murmurs on sternal borders.  JVD at jaw/ear line.   Respiratory: Bilateral breath sounds, no increased WOB,  No bibasilar crackles appreciated today. Abdomen: soft, distended, nontender to palpation, + abdominal edema. Negative hepatojugular reflex. Negative abdominal fluid wave.  Extremities: right BKA, 3+ edema of both extremities and in his stump and up his thighs. Ulcerations noted on both knees without erythema, and base covered with granulation tissue. No visible bone. Stage 2.  G/U: Scrotal and Penile edema with significant swelling, not decreased from yesterday. Tender to palpation. Penile head barely visible.   Laboratory:  Recent Labs Lab 07/06/15 1056 07/08/15 1825 07/11/15 0533  WBC 9.4 10.2 10.3  HGB 11.9* 12.1* 12.1*  HCT 39.6 39.1 38.2*  PLT 391 386 353    Recent Labs Lab 07/08/15 1825  07/11/15 0335 07/12/15 0525 07/13/15 0332  NA 138  < > 142 141 143  K 4.3  < > 3.7 3.8 3.6  CL 101  < > 101 97* 99*  CO2 28  < > 30 30 33*  BUN 19  < > 20 18 19   CREATININE 1.15  < > 1.20 1.22 1.24  CALCIUM 9.0  < > 8.7* 8.6* 8.6*  PROT 6.3*  --   --   --   --   BILITOT 1.0  --   --   --   --   ALKPHOS 137*  --   --   --   --   ALT 45  --   --   --   --   AST 36  --   --   --   --   GLUCOSE 264*  < > 112* 87 45*  < > = values in this interval not displayed.  Imaging/Diagnostic Tests: None   [name], Med Student 07/13/2015, 7:22 AM MS4, [place] Intern pager: [phone], text pages welcome   I agree with the above evaluation, assessment, and plan. Any correctional changes can be noted in [facility].   Aquilla Hacker, MD Family Medicine Resident - PGY 2  S: Doing better. Swelling is improving. Scrotum less swollen.   O:  Filed Vitals:   07/13/15 1250 07/13/15 1305  BP:  146/106 143/96  Pulse: 60 59  Temp:    Resp: 11 16   Gen: NAD, AAOx3 HEENT: NCAT, PERRLA CV: RRR infrequent pvc's, S1/S2, 2+ distal pulses.  Resp: CTA Bilaterally, appropriate rate, unlabored.  Abd: S, NT, ND, No abdominal wall edema.  G/U: Scrotal swelling improved. Foley in place.  Ext: 3+ edema.   A/P: 60 y/o M with HFrEF NICM 15% EF. Diuresing and improving.  - R/L HC today.  - Diuresing - D/C foley - Continue BB, Aldactone, Hydralazine, Add Acei after Cath - Monitor Cr - Appreciate cards recs.  - Need to discuss life vest.  - More diuresis to go.

## 2015-07-14 DIAGNOSIS — L8990 Pressure ulcer of unspecified site, unspecified stage: Secondary | ICD-10-CM

## 2015-07-14 DIAGNOSIS — N179 Acute kidney failure, unspecified: Secondary | ICD-10-CM

## 2015-07-14 DIAGNOSIS — I509 Heart failure, unspecified: Secondary | ICD-10-CM | POA: Insufficient documentation

## 2015-07-14 DIAGNOSIS — E46 Unspecified protein-calorie malnutrition: Secondary | ICD-10-CM

## 2015-07-14 LAB — GLUCOSE, CAPILLARY
Glucose-Capillary: 126 mg/dL — ABNORMAL HIGH (ref 65–99)
Glucose-Capillary: 244 mg/dL — ABNORMAL HIGH (ref 65–99)
Glucose-Capillary: 185 mg/dL — ABNORMAL HIGH (ref 65–99)
Glucose-Capillary: 111 mg/dL — ABNORMAL HIGH (ref 65–99)

## 2015-07-14 LAB — RENAL FUNCTION PANEL
Albumin: 2.3 g/dL — ABNORMAL LOW (ref 3.5–5.0)
Anion gap: 10 (ref 5–15)
BUN: 18 mg/dL (ref 6–20)
CO2: 36 mmol/L — ABNORMAL HIGH (ref 22–32)
Calcium: 8.5 mg/dL — ABNORMAL LOW (ref 8.9–10.3)
Chloride: 95 mmol/L — ABNORMAL LOW (ref 101–111)
Creatinine, Ser: 1.37 mg/dL — ABNORMAL HIGH (ref 0.61–1.24)
GFR calc Af Amer: >60 mL/min (ref >60)
GFR calc non Af Amer: 55 mL/min — ABNORMAL LOW (ref >60)
Glucose, Bld: 127 mg/dL — ABNORMAL HIGH (ref 65–99)
Phosphorus: 4.6 mg/dL (ref 2.5–4.6)
Potassium: 3.7 mmol/L (ref 3.5–5.1)
Sodium: 141 mmol/L (ref 135–145)

## 2015-07-14 LAB — MAGNESIUM: Magnesium: 1.9 mg/dL (ref 1.7–2.4)

## 2015-07-14 MED ORDER — INSULIN GLARGINE 100 UNIT/ML ~~LOC~~ SOLN
10.0000 [IU] | Freq: Every morning | SUBCUTANEOUS | Status: DC
  Administered 2015-07-14: 10 [IU] via SUBCUTANEOUS
  Filled 2015-07-14: qty 0.1

## 2015-07-14 MED ORDER — POTASSIUM CHLORIDE CRYS ER 20 MEQ PO TBCR
20.0000 meq | EXTENDED_RELEASE_TABLET | Freq: Once | ORAL | Status: AC
  Administered 2015-07-14: 20 meq via ORAL
  Filled 2015-07-14: qty 1

## 2015-07-14 MED ORDER — INSULIN GLARGINE 100 UNIT/ML ~~LOC~~ SOLN
8.0000 [IU] | Freq: Every morning | SUBCUTANEOUS | Status: DC
  Administered 2015-07-15 – 2015-07-18 (×4): 8 [IU] via SUBCUTANEOUS
  Filled 2015-07-14 (×4): qty 0.08

## 2015-07-14 NOTE — Progress Notes (Signed)
Patient Name: [name] Date of Encounter: 07/14/2015     Principal Problem:   Acute systolic CHF (congestive heart failure) ([place]) Active Problems:   HYPERTENSION, BENIGN SYSTEMIC   AKI (acute kidney injury) ([place])   CHF (congestive heart failure) ([facility])   Dyspnea   Pressure ulcer   Elevated troponin   Scrotal swelling   Congestive dilated cardiomyopathy ([facility])   Protein calorie malnutrition ([facility])   Congestive heart failure ([facility])    SUBJECTIVE  Breathing much better. Still with some orthopnea.   CURRENT MEDS . aspirin EC  81 mg Oral Daily  . atorvastatin  40 mg Oral q1800  . brimonidine  1 drop Left Eye BID  . carvedilol  25 mg Oral BID WC  . dorzolamide-timolol  1 drop Left Eye BID  . feeding supplement (PRO-STAT SUGAR FREE 64)  30 mL Oral Daily  . furosemide  80 mg Intravenous BID  . heparin subcutaneous  5,000 Units Subcutaneous Q8H  . hydrALAZINE  25 mg Oral Q8H  . insulin aspart  0-9 Units Subcutaneous TID WC  . insulin glargine  10 Units Subcutaneous q morning - 10a  . isosorbide mononitrate  30 mg Oral Daily  . multivitamin with minerals  1 tablet Oral Daily  . nutrition supplement (JUVEN)  1 packet Oral BID WC  . sodium chloride flush  3 mL Intravenous Q12H  . sodium chloride flush  3 mL Intravenous Q12H  . sodium chloride flush  3 mL Intravenous Q12H  . spironolactone  12.5 mg Oral Daily  . tamsulosin  0.4 mg Oral QPC supper  . tetrahydrozoline  1 drop Left Eye BID    OBJECTIVE  Filed Vitals:   07/13/15 1530 07/13/15 1712 07/13/15 2214 07/14/15 0552  BP: 132/80 138/85 116/76   Pulse:   62   Temp:   98.7 F (37.1 C)   TempSrc:   Oral   Resp:   20   Height:      Weight:    206 lb 12.7 oz (93.8 kg)  SpO2:   94%     Intake/Output Summary (Last 24 hours) at 07/14/15 0925 Last data filed at 07/14/15 0900  Gross per 24 hour  Intake   1105 ml  Output   4200 ml  Net  -3095 ml   Filed Weights   07/12/15 0638 07/13/15 0448 07/14/15 0552    Weight: 223 lb 8.7 oz (101.4 kg) 220 lb 14.4 oz (100.2 kg) 206 lb 12.7 oz (93.8 kg)    PHYSICAL EXAM  General: Pleasant, NAD. Neuro: Alert and oriented X 3. Moves all extremities spontaneously. Psych: Normal affect. HEENT:  Normal  Neck: Supple without bruits or + JVD. Lungs:  Resp regular and unlabored, CTA. Heart: RRR no s3, s4, or murmurs. Abdomen: Soft, non-tender, non-distended, BS + x 4.  Extremities: No clubbing, cyanosis or 1+ LLE, R BKA, with wound on knee. DP/PT/Radials 2+ and equal bilaterally.  Accessory Clinical Findings  CBC  Recent Labs  07/13/15 1428  WBC 9.4  HGB 11.6*  HCT 38.2*  MCV 76.2*  PLT [id number]   Basic Metabolic Panel  Recent Labs  07/12/15 1030 07/13/15 0332 07/13/15 0841 07/13/15 1428 07/14/15 0307  NA  --  143  --   --  141  K  --  3.6  --   --  3.7  CL  --  99*  --   --  95*  CO2  --  33*  --   --  36*  GLUCOSE  --  45*  --   --  127*  BUN  --  19  --   --  18  CREATININE  --  1.24  --  1.19 1.37*  CALCIUM  --  8.6*  --   --  8.5*  MG 1.6*  --  1.7  --   --   PHOS  --  5.3*  --   --  4.6   Liver Function Tests  Recent Labs  07/13/15 0332 07/14/15 0307  ALBUMIN 2.2* 2.3*    TELE  NSR with PVCs and PACs  Radiology/Studies  Dg Chest 2 View  07/08/2015  CLINICAL DATA:  Swelling in the bilateral legs. Shortness of breath. EXAM: CHEST  2 VIEW COMPARISON:  August 11, 2004 FINDINGS: Marked cardiomegaly is identified. No pneumothorax. No overt edema. No pulmonary nodules, masses, or infiltrates. A layering effusion, probably on the right, is best appreciated on the lateral view. No other acute abnormalities. IMPRESSION: Cardiomegaly and right-sided pleural effusion.  No overt edema. Electronically Signed   By: Dorise Bullion III M.D   On: 07/08/2015 21:10   US Scrotum  07/08/2015  CLINICAL DATA:  60 year old male with scrotal pain and swelling. EXAM: SCROTAL ULTRASOUND DOPPLER ULTRASOUND OF THE TESTICLES TECHNIQUE: Complete ultrasound  examination of the testicles, epididymis, and other scrotal structures was performed. Color and spectral Doppler ultrasound were also utilized to evaluate blood flow to the testicles. COMPARISON:  None. FINDINGS: Right testicle Measurements: 4.4 x 3 x 3 cm.  No mass or microlithiasis visualized. Left testicle Measurements: 4 x 2.8 x 2.9 cm. No mass or microlithiasis visualized. Right epididymis:  A 5 mm cyst/spermatocele is noted. Left epididymis:  Normal in size and appearance. Hydrocele:  None visualized. Varicocele:  None visualized. Pulsed Doppler interrogation of both testes demonstrates normal low resistance arterial and venous waveforms bilaterally. Diffuse scrotal wall edema and thickening noted. IMPRESSION: Diffuse scrotal wall edema. Normal testicles.  No evidence of testicular mass or torsion. Electronically Signed   By: Margarette Canada M.D.   On: 07/08/2015 20:00   [place] Art/ven Flow Abd Pelv Doppler  07/08/2015  CLINICAL DATA:  60 year old male with scrotal pain and swelling. EXAM: SCROTAL ULTRASOUND DOPPLER ULTRASOUND OF THE TESTICLES TECHNIQUE: Complete ultrasound examination of the testicles, epididymis, and other scrotal structures was performed. Color and spectral Doppler ultrasound were also utilized to evaluate blood flow to the testicles. COMPARISON:  None. FINDINGS: Right testicle Measurements: 4.4 x 3 x 3 cm.  No mass or microlithiasis visualized. Left testicle Measurements: 4 x 2.8 x 2.9 cm. No mass or microlithiasis visualized. Right epididymis:  A 5 mm cyst/spermatocele is noted. Left epididymis:  Normal in size and appearance. Hydrocele:  None visualized. Varicocele:  None visualized. Pulsed Doppler interrogation of both testes demonstrates normal low resistance arterial and venous waveforms bilaterally. Diffuse scrotal wall edema and thickening noted. IMPRESSION: Diffuse scrotal wall edema. Normal testicles.  No evidence of testicular mass or torsion. Electronically Signed   By: Margarette Canada  M.D.   On: 07/08/2015 20:00   Cardiac Cath Findings:  Ao = 135/87 (107) LV = 141/20/30 RA = 17 RV = 67/11/20 PA = 62/29 (41) PCW = 35 (v wave 45-50) Fick cardiac output/index = 6.0/2.7 PVR = 1.0 WU SVR = 1198  FA sat = 88% PA sat = 58%, 57%  Assessment: 1. 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 Severe NICM with EF 15% by echo 3. Markedly elevated filling pressures with normal  PVR 4. Normal cardiac output  Plan/Discussion:  He has severe NICM. Still markedly volume overload. Continue diuresis. Pulmonary HTN is all left-sided. PA pressures should normalize with diuresis.     ASSESSMENT AND PLAN  Mr. Blandin is a 60 year old male with a past medical history of HTN, DM. S/p right BKA. Presented to the ED with scrotal swelling and lower extremity edema. BNP was 1764, troponin was 0.21. Echo on 07/10/15 showed EF of 15%, diffuse hypokinesis, PA pressure elevated at 76mmHg.   1. Acute systolic CHF: Patient presented with cough for 2 months, SOB, scrotal and lower extremity edema. BNP elevated on admission at 1764.5. Patient's echo shows reduced EF of 15% and diffuse hypokinesis. PA pressure elevated at 67. Started on 80mg  IV Lasix BID on 07/11/15. Spiro added on 07/11/15.   Has diuresed well, net neg 12.3 L since admission. Weight down 38 pounds (244--> 206). His cough has resolved, and is breathing much better. However, right heart cath yesterday with markedly elevated filling pressures and recommendations for continued diuresis. Continue 80mg  IV BID Creatinine stable at 1.37. BUN 18.   Patient is on Hydralazine 25mg  TID and optimal dose of Coreg as well as aldactone. Will consider adding ACE I post cath if renal function stable. Imdur 30mg  daily added for preload reduction.   2. HTN: patient was hypertensive at admission, he stopped taking all of his BP meds after his R BKA he says at the advice of his MD. SBP's 150's on admission. Now well controlled on increased Coreg and addition  of hydralazine.   3. Dilated cardiomyopathy/NICM: Patient has diffuse hypokinesis on echo with EF 15%.Suspect this may be due to hypertensive CM since he stopped all BP meds a few months ago and diastolic BP was elevated in 100's on admission.Cath yesterday with minimal non obst CAD. Needs aggressive BP control and counseling on medication. Educated about restricted sodium diet compliance.  4. Severe pulmonary HTN: s/p RHC 5/10 with all left sided pulm HTN. Pulmonary pressures should normalize with diuresis.   5. DM: Management per primary team  6. R BKA with wound: patient notices odor.  Per family medicine service  Signed, Eileen Stanford PA-C  Pager [phone]

## 2015-07-14 NOTE — Progress Notes (Signed)
Family Medicine Teaching Service Daily Progress Note Intern Pager: [phone]  Patient name: [name] Medical record number: [id number] Date of birth: January 29, 1956 Age: 60 y.o. Gender: male  Primary Care Provider: Tawanna Sat, MD Consultants: none Code Status: Full   Pt Overview and Major Events to Date:  5/5: admitted for new onset HF   Assessment and Plan: [name] is a 60 y.o. male presenting with new onset CHF. PMH is significant for hypertension, schizophrenia, diabetes mellitus with peripheral neuropathy.   CHF:  New onset. NICM 15% EF. 12419cc out. Weight downtrending. Diuresing anasarca improving. - Cath: He has severe NICM. Still markedly volume overload. Continue diuresis. Pulmonary HTN is Group II.  Has lost between 30-40lb. - Creatinine up-trending Cr 1.37 today. Likely 2/2 dye for cath. Will continue diurese @ same rate - Per cards, keep Lasix 80mg  IV BID. Follow BMET. Imdur added.  - Continue coreg 25 mg BID, spironolactone 12.5mg  daily, Hydralazine 25mg  TID - Risk Strat: Lipids, LDL 55 HDL slightly low at 35, otherwise wnl; TSH 0.952; A1C 12.3  - Lipitor 40 mg QD - ASA 81 mg daily - Lisinopril hold for now with Cr.   HTN: Improved with Hydralazine and Coreg.   - start lisinopril tomorrow. - Hydralazine 25mg  TID    Scrotal/penile swelling: Considerable improvement.   -Scrotal support  -Foley removed  T2DM, uncontrolled: Last A1c 12.3 on 07/06/15. Patient admits to taking Lantus 1-2/week and Novolog 1-2x/week.  -CBG 126 this morning.  -Reduced Lantus. Novolog  D/c'ed.  -Continue to monitor CBGs  Protein Calorie Malnutrition- Moderate -Albumin 2.3 -Appreciate RD recs -Ensure patient dietary education prior to d/c   Hypomagnesemia: Goal Mg>2 - Mg 1.9  Schizophrenia - Patient alert, oriented and no signs of hallucinations or delusions on exam  - Consider psych consult for potential maximization of hospital care and medication  compliance  Constipation. Resolved - Monitor  Knee wounds and S/p R BKA. Patient has been crawling around on his knees at home to get around. Not infected.  Not painful. Worsened by protein malnutrition, now addressed.  - Amputation and previous surgical care with ortho.  - HH/ Care Management at d/c to make sure he has appropriate help and ambulatory devices at home.  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 Mild.  - ACE I as above  - Follow as outpatient. / Workup as indicated.   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 Pupil non-reactive and dilated. Followed by ophtho as an outpatient. - Continue home Brimodine, Cosopt, and visine drops in left eye.   FEN/GI: Heart health/carb modified, KVO  PPx: Sub Q heparin  Disposition: pending further diuresis.   Subjective:  Feels better today than yesterday.  He feels that his scrotal swelling is much improved from yesterday .Able to void w/o problems. Denies feeling that bladder is still full/distente post-voiding, but does endorse weak/delayed stream. Had some cough.  Specifically denies SOB, Orthopnea, and CP.   Objective: Temp:  [98.7 F (37.1 C)] 98.7 F (37.1 C) (05/10 2214) Pulse Rate:  [59-63] 62 (05/10 2214) Resp:  [6-23] 20 (05/10 2214) BP: (116-161)/(76-113) 116/76 mmHg (05/10 2214) SpO2:  [0 %-100 %] 94 % (05/10 2214) Weight:  [93.8 kg (206 lb 12.7 oz)] 93.8 kg (206 lb 12.7 oz) (05/11 0552)  Physical Exam: General: NAD, sitting in bed watching tv.  Cardiovascular: rrr  S1S2 and intermittent S3.  Respiratory: Bilateral breath sounds, no increased WOB,  No bibasilar crackles appreciated today. Abdomen: soft, distended, nontender to palpation,. Negative hepatojugular reflex. Negative abdominal fluid wave.  Extremities: right BKA, 3+ edema of both extremities to below his knee. Ulcerations noted on both knees without  erythema, and base covered with granulation tissue. No visible bone. Stage 2.  G/U: Scrotal and Penile edema with improved swelling. Nontender to palpation.    Laboratory:  Recent Labs Lab 07/08/15 1825 07/11/15 0533 07/13/15 1428  WBC 10.2 10.3 9.4  HGB 12.1* 12.1* 11.6*  HCT 39.1 38.2* 38.2*  PLT 386 353 326    Recent Labs Lab 07/08/15 1825  07/12/15 0525 07/13/15 0332 07/13/15 1428 07/14/15 0307  NA 138  < > 141 143  --  141  K 4.3  < > 3.8 3.6  --  3.7  CL 101  < > 97* 99*  --  95*  CO2 28  < > 30 33*  --  36*  BUN 19  < > 18 19  --  18  CREATININE 1.15  < > 1.22 1.24 1.19 1.37*  CALCIUM 9.0  < > 8.6* 8.6*  --  8.5*  PROT 6.3*  --   --   --   --   --   BILITOT 1.0  --   --   --   --   --   ALKPHOS 137*  --   --   --   --   --   ALT 45  --   --   --   --   --   AST 36  --   --   --   --   --   GLUCOSE 264*  < > 87 45*  --  127*  < > = values in this interval not displayed.   Imaging/Diagnostic Tests: Cath:  1. 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 Severe NICM with EF 15% by echo 3. Markedly elevated filling pressures with normal PVR 4. Normal cardiac output   [name], Med Student 07/14/2015, 7:58 AM MS4, [place] Intern pager: [phone], text pages welcome  I agree with the above evaluation, assessment, and plan. Any correctional changes can be noted in [facility].   Aquilla Hacker, MD Family Medicine Resident - PGY 2  S: Better overnight. Eating breakfast this morning. Feels better.   ODanley Danker Vitals:   07/13/15 2214 07/14/15 1046  BP: 116/76 132/90  Pulse: 62 82  Temp: 98.7 F (37.1 C)   Resp: 20   Gen: NAD, Eating CV: RRR, S1/S2, 2+ distal pulses.  Resp: CTA B, appropriate rate, unlabored.  Abd: S, ND, NT, Little bowel wall edema today.  Ext: R BKA, 2-3+ edema, Ulcerations noted chronic and stable.  G/U: Scrotal swelling improved.   A/P: 60 y/o M with HFrEF 15% NICM, Group II Pulm HTN, SHTN, DMII, improving  with diuresis. Cr slightly up today post cath.  - Continue IV diuresis, remains volume overloaded.  - Hold on Lisinopril with bump in Cr. Will start when able.  - Imdur added per cards for preload reduction.  - Cont Hydralazine, Aldactone, Coreg. - Glucose much better with reduction in insulin - Wounds not infected. Needs compression for BKA. Place TED hose for now.  - Up to chair as able.

## 2015-07-14 NOTE — Progress Notes (Signed)
Inpatient Diabetes Program Recommendations  AACE/ADA: New Consensus Statement on Inpatient Glycemic Control (2015)  Target Ranges:  Prepandial:   less than 140 mg/dL      Peak postprandial:   less than 180 mg/dL (1-2 hours)      Critically ill patients:  140 - 180 mg/dL  Results for [name], [name] (MRN [id number]) as of 07/14/2015 11:47  Ref. Range 07/13/2015 06:28 07/13/2015 06:49 07/13/2015 11:08 07/13/2015 12:51 07/13/2015 16:22 07/13/2015 21:10 07/14/2015 06:01 07/14/2015 11:17  Glucose-Capillary Latest Ref Range: 65-99 mg/dL 60 (L) 119 (H) 85 69 225 (H) 138 (H) 126 (H) 244 (H)   Review of Glycemic Control  Current orders for Inpatient glycemic control: Lantus 10 units QAM, Novolog 0-9 units TID with meals  Inpatient Diabetes Program Recommendations: Insulin - Basal: Fasting glucose 60 mg/dl on 07/13/15 and patient did NOT receive any basal insuln yesterday and fasting glucose 126 mg/dl today. May want to consider discontinuing Lantus at this time.  Thanks, Barnie Alderman, RN, MSN, CDE Diabetes Coordinator Inpatient Diabetes Program [phone] (Team Pager from [place] to [place]) [phone] ([facility] office) [phone] [facility] office) [phone] [facility] office)

## 2015-07-14 NOTE — Progress Notes (Signed)
1800 remained out of bed on recliner chair. Safety precauition maintained

## 2015-07-14 NOTE — Progress Notes (Signed)
Physical Therapy Treatment Patient Details Name: [name] MRN: [id number] DOB: Apr 11, 1955 Today's Date: 07/14/2015    History of Present Illness Patient is a 60 yo male admitted 07/08/15 with swelling - new onset CHF (including scrotal edema and inability to don prosthesis); wounds bil knees from walking on his knees; 5/10 Rt and Lt heart catheterizaiton via Rt radial artery  PMH:  HTN, schizophrenia, DM, peripheral neuropathy, Rt BKA, blind Lt eye    PT Comments    Noted patient s/p cath 5/10 via Rt radial artery. Until clarified by MD, encouraged pt to perform squat-pivot transfers (vs stand-pivot with RW and full weight through extended wrist). Patient able to demonstrate without difficulty. Discussed why he was walking on his knees at home and he states because it was easier/faster than using the wheelchair (for some tasks). He verbalized understanding twice that he should not continue to walk on his knees to allow them to heal. He has an appt in 2 weeks to see his prosthetist (along with Dr. Sharol Given). Discussed home layout and benefit of HHPT for safety evaluation to eliminate any need for him to walk on his knees. Pt agreeable if insurance will pay for it.   Follow Up Recommendations  Home health PT;Supervision - Intermittent (home safety evaluation--cannot use prosthesis due to edema from CHF)     Equipment Recommendations  Other (comment) (sliding board)    Recommendations for Other Services       Precautions / Restrictions Precautions Precautions: Fall Precaution Comments: Has Rt prosthesis that doesn't fit. States he is going to see Dr. Sharol Given and [facility] at appt in 2 weeks Restrictions Weight Bearing Restrictions: No    Mobility  Bed Mobility Overal bed mobility: Modified Independent             General bed mobility comments: Increased time due to pain.  Transfers Overall transfer level: Needs assistance Equipment used: None Transfers: Squat Pivot  Transfers     Squat pivot transfers: Min guard     General transfer comment: Unclear if pt is allowed to put full weight through his Rt wrist in extension due to recent cath. Educated pt to perform squat-pivot transfer until clarified. Pt able to direct me how to set up the chair for him to be able to reach it and performed transfer without assist.  Ambulation/Gait             General Gait Details: deferred due to ?rt wrist constraints   Stairs            Wheelchair Mobility    Modified Rankin (Stroke Patients Only)       Balance   Sitting-balance support: Feet unsupported;No upper extremity supported Sitting balance-Leahy Scale: Normal Sitting balance - Comments: donned shoe sitting EOB                            Cognition Arousal/Alertness: Awake/alert Behavior During Therapy: WFL for tasks assessed/performed Overall Cognitive Status: Within Functional Limits for tasks assessed                      Exercises      General Comments General comments (skin integrity, edema, etc.): Patient inquired re: possible use of crutches. Explained pro's and con's of crutches and he decided he did not want to try them. He asked aabout building UE strength and showed him chair pushups, but again have to clear with MD when [facility]  to weightbear with wrist extended      Pertinent Vitals/Pain Pain Assessment: Faces Faces Pain Scale: Hurts little more Pain Location: scrotum Pain Descriptors / Indicators: Grimacing Pain Intervention(s): Limited activity within patient's tolerance;Monitored during session;Repositioned    Home Living                      Prior Function            PT Goals (current goals can now be found in the care plan section) Acute Rehab PT Goals Patient Stated Goal: improve his strength Time For Goal Achievement: 07/16/15 Progress towards PT goals: Progressing toward goals    Frequency  Min 3X/week    PT Plan Current  plan remains appropriate    Co-evaluation             End of Session   Activity Tolerance: Patient tolerated treatment well Patient left: in chair;with call bell/phone within reach;with chair alarm set     Time: 1647-1710 PT Time Calculation (min) (ACUTE ONLY): 23 min  Charges:  $Therapeutic Activity: 23-37 mins                    G Codes:      SASSER,LYNN 07/31/2015, 5:31 PM Pager [phone]

## 2015-07-15 ENCOUNTER — Ambulatory Visit: Payer: Medicaid Other | Admitting: Family Medicine

## 2015-07-15 LAB — URINALYSIS, ROUTINE W REFLEX MICROSCOPIC
Bilirubin Urine: NEGATIVE
Glucose, UA: NEGATIVE mg/dL
Hgb urine dipstick: NEGATIVE
Ketones, ur: NEGATIVE mg/dL
Leukocytes, UA: NEGATIVE
Nitrite: NEGATIVE
Protein, ur: NEGATIVE mg/dL
Specific Gravity, Urine: 1.008 (ref 1.005–1.030)
pH: 7.5 (ref 5.0–8.0)

## 2015-07-15 LAB — RENAL FUNCTION PANEL
Albumin: 2.2 g/dL — ABNORMAL LOW (ref 3.5–5.0)
Anion gap: 11 (ref 5–15)
BUN: 17 mg/dL (ref 6–20)
CO2: 35 mmol/L — ABNORMAL HIGH (ref 22–32)
Calcium: 8.5 mg/dL — ABNORMAL LOW (ref 8.9–10.3)
Chloride: 96 mmol/L — ABNORMAL LOW (ref 101–111)
Creatinine, Ser: 1.22 mg/dL (ref 0.61–1.24)
GFR calc Af Amer: >60 mL/min (ref >60)
GFR calc non Af Amer: >60 mL/min (ref >60)
Glucose, Bld: 101 mg/dL — ABNORMAL HIGH (ref 65–99)
Phosphorus: 4.1 mg/dL (ref 2.5–4.6)
Potassium: 3.8 mmol/L (ref 3.5–5.1)
Sodium: 142 mmol/L (ref 135–145)

## 2015-07-15 LAB — GLUCOSE, CAPILLARY
Glucose-Capillary: 105 mg/dL — ABNORMAL HIGH (ref 65–99)
Glucose-Capillary: 265 mg/dL — ABNORMAL HIGH (ref 65–99)
Glucose-Capillary: 143 mg/dL — ABNORMAL HIGH (ref 65–99)
Glucose-Capillary: 108 mg/dL — ABNORMAL HIGH (ref 65–99)

## 2015-07-15 MED ORDER — LISINOPRIL 2.5 MG PO TABS
2.5000 mg | ORAL_TABLET | Freq: Every day | ORAL | Status: DC
  Administered 2015-07-15 – 2015-07-17 (×3): 2.5 mg via ORAL
  Filled 2015-07-15 (×3): qty 1

## 2015-07-15 MED ORDER — TAMSULOSIN HCL 0.4 MG PO CAPS
0.8000 mg | ORAL_CAPSULE | Freq: Every day | ORAL | Status: DC
  Administered 2015-07-15 – 2015-07-19 (×6): 0.8 mg via ORAL
  Filled 2015-07-15 (×6): qty 2

## 2015-07-15 NOTE — Care Management Note (Signed)
Case Management Note  Patient Details  Name: [name] MRN: [id number] Date of Birth: [birth year]  Subjective/Objective:         Admitted with CHF           Action/Plan: Patient lives alone, his sister takes him to his appointments; he states that he does his cooking and cleaning. He has his wheelchair in the room, walker, 3:1 at home. CM talked to the patient about "crawling on the floor." Patient stated that he throught that it was easier for him to get around because he could not get his wheelchair in the bathroom. CM talked to the patient about having his walker placed in the bathroom so that he could wheel himself to the bathroom and then use the walker in the bathroom. Patient stated that he would try not to crawl anymore.  PCP Dr Dema Severin, has private insurance with Medicaid with prescription drug coverage; pharmacy of choice [facility], patient reports no problem getting his medication. Patient could benefit from a Disease Management Program for CHF; [place] choice offered, pt chose [facility]. Mary with [facility] called for arrangements. [place] - HHRN/ nurses aide/ PT if possible ( pt has Medicaid and pt/ot is not always approved per their guidelines). Attending MD at discharge please enter the face to face document in Epic for [facility] services.  Expected Discharge Date:  Possibly 07/17/2015              Expected Discharge Plan:  [place]  Discharge planning Services  CM Consult  Choice offered to:  Patient  HH Arranged:  RN, Disease Management, PT, Nurse's Aide [place] Agency:  [facility]  Status of Service:  In process, will continue to follow  Royston [place] ,[place] [phone]  07/15/2015, 3:21 PM

## 2015-07-15 NOTE — Discharge Summary (Signed)
**Note Jake-Identified via Obfuscation** [name] Discharge Summary  Patient name: [name] Medical record number: [id number] Date of birth: May 04, 1955 Age: 60 y.o. Gender: male Date of Admission: 07/08/2015  Date of Discharge: 07/20/15 Admitting Physician: Jake La, MD  Primary Care Provider: Tawanna Sat, MD Consultants: Cardiology  Indication for Hospitalization: SOB in the setting of new onset CHF  Discharge Diagnoses/Problem List:  Acute systolic CHF (congestive heart failure)  Active Problems:  HYPERTENSION, BENIGN SYSTEMIC  AKI (acute kidney injury)   CHF (congestive heart failure) Reduced EF 15%, Nonischemic Cardiomyopathy  Dyspnea  Pressure ulcer  Elevated troponin  Scrotal swelling  Protein calorie malnutrition   Disposition: Home  Discharge Condition: Stable  Discharge Exam:  General: NAD, sitting up on bed  Cardiovascular: RRR, no S3 or S4. Systolic murmur not appreciated today. No JVD.  Respiratory: No wheezes, CTA, clear BS bilaterally Abd: S, NT, ND Extremities: right BKA, no edema on L foot or right leg. Ulcerations with eschar of BL LE stable and dressing applied.  Brief Tucker Course:  DELJUAN Tucker is a 60 y.o. male admitted on 5/5 with swelling in his legs and groin for 4 days (since 07/04/2015). His PMHx is significant for HTN, DMII, R BKA, schizophrenia, left eye vision loss.   CHFrEF: Patient admitted for 4 days of marked leg edema and scrotal swelling, sleeping with multiple pillows, dry cough that is worst at night for 2 months, but no dyspnea or shortness of breath. Patient was not taking his BP medications, Losartan-HCTZ, for 2 months prior which was thought to be a contributing factor in his decompensated heart failure.  His pertinent admission labs were: BNP 1764.5 and troponin 0.26, Cr 1.15, glucose 264, A1c 12.3. Admission weight: 110.9 kg.  Cards was consulted and [name] was started on aggressive diuresis with IV Lasix. R/L Heat  cath showed: 1. 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 Severe NICM with EF 15% by echo; 3. Markedly elevated filling pressures with normal PVR; 4. Normal cardiac output. He continued to be diuresed on IV Lasix 5/17 at which time he was transitioned to PO Lasix. Lasix at d/c 40 QD.  Diuresis resulted in total 30.8 L output and weight change of 26.7 kg and marked symptom improvement.. Dry weight is 84.188 kg. Medications were optimized, and he was d/c'd on Bidil, Aldactone, Lisinopril, Coreg, Lipitor, ASA in addition to lasix noted above.   At the time of discharge he was not symptomatic, including denying SOB, orthopnea. His anasarca was resolved.  Dry Weight > 185lb.   Pulmonary HTN: Almost certainly Group II. Could consider follow-up PFTs and sleep study as outpatient if repeat echo reveals persistently elevated PA pressures   DM2: A1c 12.3 on admission. Home meds: Lantus 10 units and Novolog 5 units TID. He admits to taking Lantus and Novolog only 1-2x/week. His med non-compliance re DM is 2/2 hypoglycemic episodes and complicated regimen with sliding scale in addition to eating habits. [name]'s DM was challenging to control with high BGs and symptomatic hypoglycemic episodes that were easily corrected with juice. After some change with his Lantus and Novolog and thoughtful discussion he was d/c on Lantus 10 units, Novolog 3 units TID with meals for ease and increased medication compliance. Consider adding metformin with goal of 1000 BID outpatient once Cr normalizes.  HTN: Improved with Hydralazine 25 mgTID, Spironolactone 25 QD, and Lisinopril 5 mg BID, which were added on this admission. BP on d/c was 111/78.   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 Mild.D/c on lisinopril 5 mg BID.   Knee wounds and S/p R BKA. Patient crawls on his knees at home and had non-infected wounds that are worsened by protein malnutrition on admission. Of note, he transports in his wheel chair or  walks on his knees at home because unable to fit his leg into prosthesis. Hopeful that without the swelling his leg will fit into prosthesis. F/U with [name] as o/p planned.   Protein Calorie Malnutrition: Albumin of 2.2 received RD education and instructions.   Schizophrenia. Stable without medication. Patient alert, oriented and no signs of hallucinations or delusions throughout admission.  He received DM, CV and sodium intake education, which he would great benefit from. Patient is motivated to remain out of the Tucker and avoid "this swelling" again, which we are hopeful can be achieved with appropriate medication adherence and close f/u. He was discharged on the medications below with the understanding that controlling his HTN, DM, and CHF is imperative in helping to prevent swelling and re-admission.   Issues for Follow Up:  1. 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  2. Group II pulm HTN: f/u on Echo in 3-5 months, PFTs & sleep study if indicated 3. T2DM- A1c of 12.3 > f/u home CBG's and Insulin compliance.  4. R BKA w/ wound - F/U with [name]   Significant Procedures: [place] cath  Significant Labs and Imaging:   Recent Labs Lab 07/13/15 1428  WBC 9.4  HGB 11.6*  HCT 38.2*  PLT 326    Recent Labs Lab 07/14/15 0307 07/14/15 1015 07/15/15 0647 07/16/15 0502 07/17/15 0448 07/18/15 0337 07/19/15 0444 07/20/15 0454  NA 141  --  142 141 140 138 138 139  K 3.7  --  3.8 3.7 3.8 3.7 4.1 4.9  CL 95*  --  96* 95* 96* 95* 93* 93*  CO2 36*  --  35* 32 33* 35* 33* 35*  GLUCOSE 127*  --  101* 71 184* 217* 151* 212*  BUN 18  --  17 16 18 16  23* 31*  CREATININE 1.37*  --  1.22 1.15 1.10 1.20 1.16 1.44*  CALCIUM 8.5*  --  8.5* 8.8* 8.7* 8.8* 9.1 9.4  MG  --  1.9  --  1.8  --  1.8 2.0  --   PHOS 4.6  --  4.1 3.6 3.3  --   --   --   ALBUMIN 2.3*  --  2.2* 2.4* 2.6*  --   --   --     Dg Chest 2 View  07/08/2015  CLINICAL DATA:  Swelling in the bilateral legs.  Shortness of breath. EXAM: CHEST  2 VIEW COMPARISON:  August 11, 2004 FINDINGS: Marked cardiomegaly is identified. No pneumothorax. No overt edema. No pulmonary nodules, masses, or infiltrates. A layering effusion, probably on the right, is best appreciated on the lateral view. No other acute abnormalities. IMPRESSION: Cardiomegaly and right-sided pleural effusion.  No overt edema. Electronically Signed   By: Dorise Bullion III M.D   On: 07/08/2015 21:10   US Scrotum  07/08/2015  CLINICAL DATA:  60 year old male with scrotal pain and swelling. EXAM: SCROTAL ULTRASOUND DOPPLER ULTRASOUND OF THE TESTICLES TECHNIQUE: Complete ultrasound examination of the testicles, epididymis, and other scrotal structures was performed. Color and spectral Doppler ultrasound were also utilized to evaluate blood flow to the testicles. COMPARISON:  None. FINDINGS: Right testicle Measurements: 4.4 x 3 x 3 cm.  No mass or  microlithiasis visualized. Left testicle Measurements: 4 x 2.8 x 2.9 cm. No mass or microlithiasis visualized. Right epididymis:  A 5 mm cyst/spermatocele is noted. Left epididymis:  Normal in size and appearance. Hydrocele:  None visualized. Varicocele:  None visualized. Pulsed Doppler interrogation of both testes demonstrates normal low resistance arterial and venous waveforms bilaterally. Diffuse scrotal wall edema and thickening noted. IMPRESSION: Diffuse scrotal wall edema. Normal testicles.  No evidence of testicular mass or torsion. Electronically Signed   By: Margarette Canada M.D.   On: 07/08/2015 20:00   [place] Art/ven Flow Abd Pelv Doppler  07/08/2015  CLINICAL DATA:  60 year old male with scrotal pain and swelling. EXAM: SCROTAL ULTRASOUND DOPPLER ULTRASOUND OF THE TESTICLES TECHNIQUE: Complete ultrasound examination of the testicles, epididymis, and other scrotal structures was performed. Color and spectral Doppler ultrasound were also utilized to evaluate blood flow to the testicles. COMPARISON:  None. FINDINGS:  Right testicle Measurements: 4.4 x 3 x 3 cm.  No mass or microlithiasis visualized. Left testicle Measurements: 4 x 2.8 x 2.9 cm. No mass or microlithiasis visualized. Right epididymis:  A 5 mm cyst/spermatocele is noted. Left epididymis:  Normal in size and appearance. Hydrocele:  None visualized. Varicocele:  None visualized. Pulsed Doppler interrogation of both testes demonstrates normal low resistance arterial and venous waveforms bilaterally. Diffuse scrotal wall edema and thickening noted. IMPRESSION: Diffuse scrotal wall edema. Normal testicles.  No evidence of testicular mass or torsion. Electronically Signed   By: Margarette Canada M.D.   On: 07/08/2015 20:00    Results/Tests Pending at Time of Discharge: None  Discharge Medications:    Medication List    TAKE these medications        ACCU-CHEK AVIVA PLUS test strip  Generic drug:  glucose blood  CHECK SUGAR 3 TIMES DAILY     acetaminophen 500 MG tablet  Commonly known as:  TYLENOL  Take 1,000 mg by mouth every 6 (six) hours as needed for moderate pain.     aspirin 81 MG EC tablet  Take 1 tablet (81 mg total) by mouth daily.     atorvastatin 40 MG tablet  Commonly known as:  LIPITOR  Take 1 tablet (40 mg total) by mouth daily at 6 PM.     brimonidine 0.2 % ophthalmic solution  Commonly known as:  ALPHAGAN  PLACE 1 DROP INTO THE LEFT EYE 2 (TWO) TIMES DAILY.     carvedilol 25 MG tablet  Commonly known as:  COREG  Take 1 tablet (25 mg total) by mouth 2 (two) times daily with a meal.     dorzolamide-timolol 22.3-6.8 MG/ML ophthalmic solution  Commonly known as:  COSOPT  Place 1 drop into the left eye 2 (two) times daily.     furosemide 40 MG tablet  Commonly known as:  LASIX  Take 1 tablet (40 mg total) by mouth daily.  Start taking on:  07/21/2015     HYDROcodone-acetaminophen 5-325 MG tablet  Commonly known as:  NORCO  Take 1 tablet by mouth every 6 (six) hours as needed.     insulin glargine 100 UNIT/ML injection   Commonly known as:  LANTUS  Inject 0.1 mLs (10 Units total) into the skin every morning.     isosorbide-hydrALAZINE 20-37.5 MG tablet  Commonly known as:  BIDIL  Take 1 tablet by mouth 3 (three) times daily.     lisinopril 5 MG tablet  Commonly known as:  PRINIVIL,ZESTRIL  Take 1 tablet (5 mg total) by mouth daily.  NOVOLOG 100 UNIT/ML injection  Generic drug:  insulin aspart  INJECT 5 UNITS INTO THE SKIN 3 (THREE) TIMES DAILY BEFORE MEALS. DO NOT TAKE IF SUGAR IS <150     spironolactone 25 MG tablet  Commonly known as:  ALDACTONE  Take 1 tablet (25 mg total) by mouth daily.     tamsulosin 0.4 MG Caps capsule  Commonly known as:  FLOMAX  Take 2 capsules (0.8 mg total) by mouth daily after supper.     VISINE OP  Place 1 drop into the left eye 2 (two) times daily. Only uses in left eye.     vitamin C 500 MG tablet  Commonly known as:  ASCORBIC ACID  Take 500 mg by mouth daily.        Discharge Instructions: Please refer to Patient Instructions section of EMR for full details.  Patient was counseled important signs and symptoms that should prompt return to medical care, changes in medications, dietary instructions, activity restrictions, and follow up appointments.   Follow-Up Appointments: Follow-up Information    Follow up with [facility].   Specialty:  [place]   Why:  They will do your home health care at your home   Contact information:   [place] [address] [phone]       Follow up with Jake Sat, MD. Go on 07/29/2015.   Specialty:  Family Medicine   Why:  @1 :45pm   Contact information:   [place] [address] [phone]       Follow up with Glori Bickers, MD On 08/04/2015.   Specialty:  Cardiology   Why:  10:20am for Tucker follow up.   Contact information:   [address] [place] [address] [place] [facility]   Aquilla Hacker, MD 07/20/2015, 2:15 PM PGY 2, [place]

## 2015-07-15 NOTE — Progress Notes (Signed)
OT Note:  Issued elastic shoelaces.  Placed in shoe for increased support, but if pt prefers his standard lace, it is in his pt belonging's bag in the closet.  [place], OTR/L [id number] 07/15/2015

## 2015-07-15 NOTE — Progress Notes (Signed)
Family Medicine Teaching Service Daily Progress Note Intern Pager: [phone]  Patient name: [name] Medical record number: [id number] Date of birth: 1955-07-10 Age: 60 y.o. Gender: male  Primary Care Provider: Tawanna Sat, MD Consultants: none Code Status: Full   Pt Overview and Major Events to Date:  5/5: admitted for new onset HF   Assessment and Plan: MONTANA OTTMAR is a 60 y.o. male presenting with new onset CHF. PMH is significant for hypertension, schizophrenia, diabetes mellitus with peripheral neuropathy.   CHF:  New onset. 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 14.4 L out since admission. Wt 19kg down , Anasarca improving. - appreciate cards recs - Creatinine normalizing Cr 1.22 today. Started lisinopril low dose today. Will continue diurese @ same rate. May be able to change to PO diuretics tomorrow - Follow BMET - Continue coreg 25 mg BID, spironolactone 12.5mg  daily, Hydralazine 25mg  TID, Lipitor 40 mg QD. ASA 81 mg daily - may try to get REDS vest for dry weight prior to d/c - PT/OT consult for potential d/c on Sunday  - Talked with patient about salt intake: would benefit from RD & education of salt intake and diabetic diet. RD c/s.   SOB: CHF vs. Restrictive lung disease vs. Chronic PE, CHF.  -restrictive inspiration since BKA in 2015 -Consider O/P spirometry    Urinary symptoms: Frequency, urgency, delayed and weak stream, though feels that voids bladder. Possible BPD.  - on Flomax. Monitor symptoms.   HTN: Improved with Hydralazine and Coreg.   - Cr 1.22 today. Start lisinopril at 2.5 mg. - Hydralazine 25mg  TID, Coreg, Aldactone.     Scrotal/penile swelling: Considerably improved.   - improved.   T2DM, uncontrolled: Last A1c 12.3 on 07/06/15. Patient reports non-adherence to medication is because of how it makes him feel ("all over the place"): Simplify diabetic O/P regimen for greater compliance. Patient is motivated to  comply.  -CBG 101 this morning.  -Reduced Lantus. Novolog  D/c'ed  -Continue to monitor CBGs -D/c home on Lantus without mealtime insulin for greater compliance 2/2 concern for hypoglycemia  Protein Calorie Malnutrition- Moderate -Albumin 2.2 -Appreciate RD recs -Ensure patient dietary education prior to d/c   Hypomagnesemia: Goal Mg>2 - Mg 1.9  Schizophrenia - Patient alert, oriented and no signs of hallucinations or delusions on exam  - Consider psych consult for potential maximization of hospital care and medication compliance  Constipation. Resolved - Monitor  Knee wounds and S/p R BKA. Patient has been crawling around on his knees at home to get around. Not infected.  Not painful. Worsened by protein malnutrition. Seen by [place] who discussed with Dr. Sharol Given. Also felt they were not infected, and dressing with use of knee pads at home for now. F/U with Dr. Sharol Given as o/p planned.  - Amputation and previous surgical care with ortho.  - HH/ Care Management at d/c to make sure he has appropriate help and ambulatory devices at home.  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 Mild.  - ACE I as above  - Follow as outpatient. / Workup as indicated.   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 Pupil non-reactive and dilated. Followed by ophtho as an outpatient. - Continue home Brimodine, Cosopt, and visine drops in left eye.   FEN/GI: Heart health/carb modified, KVO  PPx: Sub Q heparin  Disposition: pending further diuresis.  Subjective:  Feels better today than yesterday.  He feels that his scrotal swelling is much improved from yesterday .Able to void w/o problems. Denies feeling that bladder is still full/distente post-voiding, but does endorse weak/delayed stream. Had some cough.  Specifically denies SOB, Orthopnea, and CP.   Of note, patient is interested in "what to do to  prevent this"  Objective: Temp:  [98.3 F (36.8 C)] 98.3 F (36.8 C) (05/12 0600) Pulse Rate:  [63-82] 63 (05/12 0600) Resp:  [20] 20 (05/12 0600) BP: (126-135)/(85-92) 126/85 mmHg (05/12 0600) SpO2:  [95 %-100 %] 95 % (05/12 0600) Weight:  [91.944 kg (202 lb 11.2 oz)] 91.944 kg (202 lb 11.2 oz) (05/12 [id number])  Physical Exam:   General: NAD, sitting up in chair watching tv.  Cardiovascular: rrr  S1S2 and intermittent PVC.   Respiratory: Bilateral breath sounds, no increased WOB,  No bibasilar crackles appreciated today. Extremities: right BKA, 3+ edema of both extremities to below his knee. Ulcerations noted on both knees without erythema, and base covered with granulation tissue. No visible bone. Stage 2.  G/U: Scrotal and Penile edema with improved swelling. Nontender to palpation.    Laboratory:  Recent Labs Lab 07/08/15 1825 07/11/15 0533 07/13/15 1428  WBC 10.2 10.3 9.4  HGB 12.1* 12.1* 11.6*  HCT 39.1 38.2* 38.2*  PLT 386 353 326    Recent Labs Lab 07/08/15 1825  07/12/15 0525 07/13/15 0332 07/13/15 1428 07/14/15 0307  NA 138  < > 141 143  --  141  K 4.3  < > 3.8 3.6  --  3.7  CL 101  < > 97* 99*  --  95*  CO2 28  < > 30 33*  --  36*  BUN 19  < > 18 19  --  18  CREATININE 1.15  < > 1.22 1.24 1.19 1.37*  CALCIUM 9.0  < > 8.6* 8.6*  --  8.5*  PROT 6.3*  --   --   --   --   --   BILITOT 1.0  --   --   --   --   --   ALKPHOS 137*  --   --   --   --   --   ALT 45  --   --   --   --   --   AST 36  --   --   --   --   --   GLUCOSE 264*  < > 87 45*  --  127*  < > = values in this interval not displayed.   Imaging/Diagnostic Tests: None  [name], Med Student 07/15/2015, 7:30 AM MS4, [place] Intern pager: [phone], text pages welcome   I agree with the above evaluation, assessment, and plan. Any correctional changes can be noted in [facility].   Aquilla Hacker, MD Family Medicine Resident - PGY 2  S: Doing better. Diuresing  well.   O:  Filed Vitals:   07/15/15 1106 07/15/15 1200  BP: 113/89 130/84  Pulse:  64  Temp:  98.3 F (36.8 C)  Resp:  20  Gen: NAD CV: RRR, no MGR, S1/S2, 2+ distal pulses.  Abd: ND, NT, S, less wall edema.  G/U: scrotal swelling improving. Ext: MAEW, R BKA with ulceration and L knee with Ulceration eschar over both. Some poor smell. Edema improving  A/P: 60 y/o M with HFrEF 15% NICM, PHTN, SHTN, Knee Ulcerations - Added low dose ACEI - Continue  IV lasix today and PO tomorrow per cards. Appreciate their recs - Increased Flomax today - PT/OT / HH needs prepping for d/c in the next couple of days.  - WOC evaluated knees and discussed with Ortho. No intervention at this time. Not infected.  - Will consult REDS vest prior to D/C.  - ? Will need plan regarding defibrillation prior to D/C. Life vest / long term plan.

## 2015-07-15 NOTE — Progress Notes (Signed)
Nutrition Education Note  RD consulted for low sodium and carb modified nutrition education regarding CHF and diabetes.   RD provided "Low Sodium Nutrition Therapy" handout from the Academy of [employer] and Dietetics. Reviewed patient's dietary recall. Meals appear to be balance in carbohydrates. Pt snacks on cookies and hard candies throughout the day. Provided examples on ways to decrease sodium intake in diet. Discouraged intake of processed foods and use of salt shaker. Encouraged fresh fruits and vegetables as well as whole grain sources of carbohydrates to maximize fiber intake. Pt reports eating whole grains and not adding any salt to his food PTA. Provide low carb snack ideas and encouraged pt to buy sugar-free hard candies instead of regular.   RD discussed why it is important for patient to adhere to diet recommendations and emphasized foods to avoid. Teach back method used. Pt states that he can eliminate sausage from his breakfast and bologna from his lunch; he can start eating swiss cheese instead do [place]; will decrease intake of cookies and candy.   Expect good compliance.  RD is following pt due to unintentional weight loss and increased nutrient needs for wound healing. Pt states that his appetite has improved; he is eating 100% of most meals. He is taking vitamin and protein supplements daily. RD will continue to monitor.  Labs and medications reviewed. RD contact information provided.  Scarlette Ar RD, LDN Inpatient Clinical Dietitian Pager: [phone] After Hours Pager: [phone]

## 2015-07-15 NOTE — Progress Notes (Signed)
Occupational Therapy Treatment Patient Details Name: JALIEN JANSKY MRN: [id number] DOB: Apr 29, 1955 Today's Date: 07/15/2015    History of present illness Patient is a 60 yo male admitted 07/08/15 with swelling - new onset CHF (including scrotal edema and inability to don prosthesis); wounds bil knees from walking on his knees; 5/10 Rt and Lt heart catheterizaiton via Rt radial artery  PMH:  HTN, schizophrenia, DM, peripheral neuropathy, Rt BKA, blind Lt eye   OT comments  Pt progressing well with OT.  Would benefit from elastic shoelace as he slip shoe on and doesn't tie it.  Follow Up Recommendations  Home health OT;Supervision - Intermittent    Equipment Recommendations  None recommended by OT    Recommendations for Other Services      Precautions / Restrictions Precautions Precautions: Fall Precaution Comments: Has Rt prosthesis that doesn't fit. States he is going to see Dr. Sharol Given and [employer] [facility] at appt in 2 weeks Restrictions Weight Bearing Restrictions: No       Mobility Bed Mobility Overal bed mobility: Modified Independent                Transfers     Transfers: Sit to/from Stand;Stand Pivot Transfers Sit to Stand: Min guard Stand pivot transfers: Min guard       General transfer comment: for safety    Balance                                   ADL               Lower Body Bathing: Set up;Sitting/lateral leans       Lower Body Dressing: Min guard;Sit to/from stand                 General ADL Comments: Pt performed LB adls and got up into chair.  Had noted PT's note about clarifying WB on RUE.  Checked with RN to see if clarified and she stated no precautions as procedure was done a couple of days ago.  NT in room and stated pt has been using RW for transfers.  No LOB but min guard for safety.  Pt leaves shoe untied.  He would benefit from elastic shoelaces      Vision                     Perception     Praxis      Cognition   Behavior During Therapy: WFL for tasks assessed/performed Overall Cognitive Status: Within Functional Limits for tasks assessed                       Extremity/Trunk Assessment               Exercises     Shoulder Instructions       General Comments      Pertinent Vitals/ Pain       Pain Assessment: No/denies pain  Home Living                                          Prior Functioning/Environment              Frequency Min 2X/week     Progress Toward Goals  OT Goals(current goals can now be found in the  care plan section)  Progress towards OT goals: Progressing toward goals  Acute Rehab OT Goals Patient Stated Goal: improve his strength  Plan      Co-evaluation                 End of Session Equipment Utilized During Treatment: Gait belt;Rolling walker   Activity Tolerance Patient tolerated treatment well   Patient Left in chair;with call bell/phone within reach;with chair alarm set   Nurse Communication          Time: [phone] OT Time Calculation (min): 14 min  Charges: OT General Charges $OT Visit: 1 Procedure OT Treatments $Self Care/Home Management : 8-22 mins  [name],[name] 07/15/2015, 9:29 AM  Lesle Chris, OTR/L [phone] 07/15/2015

## 2015-07-15 NOTE — Progress Notes (Signed)
Patient Name: [name] Date of Encounter: 07/15/2015  Principal Problem:   Acute systolic CHF (congestive heart failure) ([place]) Active Problems:   HYPERTENSION, BENIGN SYSTEMIC   AKI (acute kidney injury) ([place])   CHF (congestive heart failure) ([facility])   Dyspnea   Pressure ulcer   Elevated troponin   Scrotal swelling   Congestive dilated cardiomyopathy ([place])   Protein calorie malnutrition ([place])   Congestive heart failure [facility])   Primary Cardiologist: [place], Dr Johnsie Cancel  Patient Profile: 60 yo male w/ hx HTN, DM, R-BKA. Admitted 05/05 w/ SOB, edema. CHF dx w/ EF 15%, cath w/ PAH 2nd CHF, no sig CAD, results below  SUBJECTIVE: No complaints - feeling much better.  Still complains of scrotal edema but improving  OBJECTIVE Filed Vitals:   07/15/15 0600 07/15/15 0648 07/15/15 1106 07/15/15 1200  BP: 126/85  113/89 130/84  Pulse: 63   64  Temp: 98.3 F (36.8 C)   98.3 F (36.8 C)  TempSrc: Oral   Oral  Resp: 20   20  Height:      Weight:  202 lb 11.2 oz (91.944 kg)    SpO2: 95%   96%    Intake/Output Summary (Last 24 hours) at 07/15/15 1229 Last data filed at 07/15/15 1158  Gross per 24 hour  Intake   1465 ml  Output   2075 ml  Net   -610 ml   Filed Weights   07/13/15 0448 07/14/15 0552 07/15/15 0648  Weight: 220 lb 14.4 oz (100.2 kg) 206 lb 12.7 oz (93.8 kg) 202 lb 11.2 oz (91.944 kg)    PHYSICAL EXAM General: Well developed, well nourished, male in no acute distress. Head: Normocephalic, atraumatic.  Neck: Supple without bruits, JVD. Lungs:  Resp regular and unlabored, CTA. Heart: RRR, S1, S2, no S3, S4, or murmur; no rub. Abdomen: Soft, non-tender, non-distended, BS + x 4.  Extremities: right BKA with ulcer on stump with foul smell.  Ulcer on LLE at knee as well.  1+ edema of LLE Neuro: Alert and oriented X 3. Moves all extremities spontaneously. Psych: Normal affect.  LABS: CBC: Recent Labs  07/13/15 1428  WBC 9.4  HGB 11.6*  HCT 38.2*  MCV  76.2*  PLT 326   INR: Recent Labs  07/13/15 0454  INR [id number]   Basic Metabolic Panel: Recent Labs  07/13/15 0841  07/14/15 0307 07/14/15 1015 07/15/15 0647  NA  --   --  141  --  142  K  --   --  3.7  --  3.8  CL  --   --  95*  --  96*  CO2  --   --  36*  --  35*  GLUCOSE  --   --  127*  --  101*  BUN  --   --  18  --  17  CREATININE  --   < > 1.37*  --  1.22  CALCIUM  --   --  8.5*  --  8.5*  MG 1.7  --   --  1.9  --   PHOS  --   --  4.6  --  4.1  < > = values in this interval not displayed.  Liver Function Tests: Recent Labs  07/14/15 0307 07/15/15 0647  ALBUMIN 2.3* 2.2*   BNP:  B NATRIURETIC PEPTIDE  Date/Time Value Ref Range Status  07/08/2015 06:25 PM 1764.5* 0.0 - 100.0 pg/mL Final   TELE:  Cath: 05/10  Mid LAD lesion, 20% stenosed. Findings: Ao = 135/87 (107) LV = 141/20/30 RA = 17 RV = 67/11/20 PA = 62/29 (41) PCW = 35 (v wave 45-50) Fick cardiac output/index = 6.0/2.7 PVR = 1.0 WU SVR = 1198  FA sat = 88% PA sat = 58%, 57% Assessment: 1. Minimal non-obstructive CAD 2. Severe NICM with EF 15% by echo 3. Markedly elevated filling pressures with normal PVR 4. Normal cardiac output Plan/Discussion: He has severe NICM. Still markedly volume overload. Continue diuresis. Pulmonary HTN is all left-sided. PA pressures should normalize with diuresis.   Radiology/Studies: No results found.   Current Medications:  . aspirin EC  81 mg Oral Daily  . atorvastatin  40 mg Oral q1800  . brimonidine  1 drop Left Eye BID  . carvedilol  25 mg Oral BID WC  . dorzolamide-timolol  1 drop Left Eye BID  . feeding supplement (PRO-STAT SUGAR FREE 64)  30 mL Oral Daily  . furosemide  80 mg Intravenous BID  . heparin subcutaneous  5,000 Units Subcutaneous Q8H  . hydrALAZINE  25 mg Oral Q8H  . insulin aspart  0-9 Units Subcutaneous TID WC  . insulin glargine  8 Units Subcutaneous q morning - 10a  . isosorbide mononitrate  30 mg Oral Daily  .  lisinopril  2.5 mg Oral Daily  . multivitamin with minerals  1 tablet Oral Daily  . nutrition supplement (JUVEN)  1 packet Oral BID WC  . sodium chloride flush  3 mL Intravenous Q12H  . sodium chloride flush  3 mL Intravenous Q12H  . sodium chloride flush  3 mL Intravenous Q12H  . spironolactone  12.5 mg Oral Daily  . tamsulosin  0.8 mg Oral QPC supper  . tetrahydrozoline  1 drop Left Eye BID     ASSESSMENT AND PLAN  Mr. Blanke is a 60 year old male with a past medical history of HTN, DM. S/p right BKA. Presented to the ED with scrotal swelling and lower extremity edema. BNP was 1764, troponin was 0.21. Echo on 07/10/15 showed EF of 15%, diffuse hypokinesis, PA pressure elevated at 37mmHg.   1. Acute systolic CHF: Patient presented with cough for 2 months, SOB, scrotal and lower extremity edema. BNP elevated on admission at 1764.5. Patient's echo shows reduced EF of 15% and diffuse hypokinesis. PA pressure elevated at 67. Started on 80mg  IV Lasix BID on 07/11/15. Spiro added on 07/11/15.   Has diuresed well, net neg 14.5 L since admission. Weight down 42 pounds (244--> 202). His cough has resolved, and is breathing much better. However, right heart cath with markedly elevated filling pressures and recommendations for continued diuresis. Continue 80mg  IV BID since he continues to have LE edema and scrotal edema. Creatinine stable at 1.22. BUN 17. May be able to change to PO diuretics tomorrow.  Patient is on Hydralazine 25mg  TID and Imdur and optimal dose of Coreg as well as aldactone. Low dose ACE I added yesterday.    2. HTN: patient was hypertensive at admission, he stopped taking all of his BP meds after his R BKA he says at the advice of his MD. SBP's 150's on admission. Now well controlled on increased Coreg and addition of hydralazine, Imdur and ACE I.   3. Dilated cardiomyopathy/NICM: Patient has diffuse hypokinesis on echo with EF 15%.Suspect this may be due to hypertensive CM  since he stopped all BP meds a few months ago and diastolic BP was elevated in 100's  on admission.Cath with minimal non obst CAD. Needs aggressive BP control and counseling on medication. Educated about restricted sodium diet compliance.  4. Severe pulmonary HTN: s/p RHC 5/10 with all left sided pulm HTN. Pulmonary pressures should normalize with diuresis.   5. DM: Management per primary team  6. R BKA with wound:that has very foul order and concern for wound infection. Per family medicine service

## 2015-07-16 LAB — RENAL FUNCTION PANEL
Albumin: 2.4 g/dL — ABNORMAL LOW (ref 3.5–5.0)
Anion gap: 14 (ref 5–15)
BUN: 16 mg/dL (ref 6–20)
CO2: 32 mmol/L (ref 22–32)
Calcium: 8.8 mg/dL — ABNORMAL LOW (ref 8.9–10.3)
Chloride: 95 mmol/L — ABNORMAL LOW (ref 101–111)
Creatinine, Ser: 1.15 mg/dL (ref 0.61–1.24)
GFR calc Af Amer: >60 mL/min (ref >60)
GFR calc non Af Amer: >60 mL/min (ref >60)
Glucose, Bld: 71 mg/dL (ref 65–99)
Phosphorus: 3.6 mg/dL (ref 2.5–4.6)
Potassium: 3.7 mmol/L (ref 3.5–5.1)
Sodium: 141 mmol/L (ref 135–145)

## 2015-07-16 LAB — GLUCOSE, CAPILLARY
Glucose-Capillary: 85 mg/dL (ref 65–99)
Glucose-Capillary: 267 mg/dL — ABNORMAL HIGH (ref 65–99)
Glucose-Capillary: 276 mg/dL — ABNORMAL HIGH (ref 65–99)
Glucose-Capillary: 243 mg/dL — ABNORMAL HIGH (ref 65–99)

## 2015-07-16 LAB — MAGNESIUM: Magnesium: 1.8 mg/dL (ref 1.7–2.4)

## 2015-07-16 MED ORDER — CARVEDILOL 25 MG PO TABS
25.0000 mg | ORAL_TABLET | Freq: Two times a day (BID) | ORAL | Status: DC
  Administered 2015-07-16 – 2015-07-20 (×10): 25 mg via ORAL
  Filled 2015-07-16 (×11): qty 1

## 2015-07-16 MED ORDER — JUVEN PO PACK
1.0000 | PACK | Freq: Two times a day (BID) | ORAL | Status: DC
Start: 2015-07-16 — End: 2015-07-20
  Administered 2015-07-16 – 2015-07-19 (×7): 1 via ORAL
  Filled 2015-07-16 (×10): qty 1

## 2015-07-16 NOTE — Progress Notes (Signed)
Patient Name: [name]      SUBJECTIVE: Admitted 5/6*17 with acute heart failure and found to have severe LV dysfunction-15%. This occurs in the context of diabetes and hypertension prior lower extremity amputation  He has undergone diuresis. Catheterization demonstrated no obstructive disease with markedly elevated filling pressures and ongoing diuresis was recommended  Thus far he is diuresed about 35 pounds  Breathing much beter  Past Medical History  Diagnosis Date  . Diabetes mellitus   . Hypertension   . Congestive dilated cardiomyopathy ([place]) 07/11/2015    Scheduled Meds:  Scheduled Meds: . aspirin EC  81 mg Oral Daily  . atorvastatin  40 mg Oral q1800  . brimonidine  1 drop Left Eye BID  . carvedilol  25 mg Oral BID WC  . dorzolamide-timolol  1 drop Left Eye BID  . feeding supplement (PRO-STAT SUGAR FREE 64)  30 mL Oral Daily  . furosemide  80 mg Intravenous BID  . heparin subcutaneous  5,000 Units Subcutaneous Q8H  . hydrALAZINE  25 mg Oral Q8H  . insulin glargine  8 Units Subcutaneous q morning - 10a  . isosorbide mononitrate  30 mg Oral Daily  . lisinopril  2.5 mg Oral Daily  . multivitamin with minerals  1 tablet Oral Daily  . nutrition supplement (JUVEN)  1 packet Oral BID WC  . sodium chloride flush  3 mL Intravenous Q12H  . sodium chloride flush  3 mL Intravenous Q12H  . sodium chloride flush  3 mL Intravenous Q12H  . spironolactone  12.5 mg Oral Daily  . tamsulosin  0.8 mg Oral QPC supper  . tetrahydrozoline  1 drop Left Eye BID   Continuous Infusions:  sodium chloride, sodium chloride, acetaminophen **OR** acetaminophen, HYDROcodone-acetaminophen, ondansetron (ZOFRAN) IV, sodium chloride flush, sodium chloride flush, sodium chloride flush    PHYSICAL EXAM Filed Vitals:   07/15/15 2057 07/16/15 0515 07/16/15 0519 07/16/15 1225  BP: 136/77 130/81  147/92  Pulse: 60 64  63  Temp: 98.4 F (36.9 C) 98.6 F (37 C)  97.9 F (36.6 C)    TempSrc: Oral Oral  Oral  Resp: 18 18  20   Height:      Weight:  214 lb 12.8 oz (97.433 kg) 204 lb (92.534 kg)   SpO2: 95% 95%  100%    Well developed and nourished in no acute distress HENT normal Neck supple  Clear Regular rate and rhythm, no murmurs or gallops Abd-soft with active BS No Clubbing cyanosis 1-2+edema Knees wounded from walking on them Skin-warm and dry A & Oriented  Grossly normal sensory and motor function   TELEMETRY: Reviewed telemetry pt in nsr    Intake/Output Summary (Last 24 hours) at 07/16/15 1312 Last data filed at 07/16/15 1227  Gross per 24 hour  Intake    840 ml  Output   3950 ml  Net  -3110 ml    LABS: Basic Metabolic Panel:  Recent Labs Lab 07/10/15 0352  07/11/15 0335  07/12/15 0525  07/13/15 0332  07/13/15 1428 07/14/15 0307 07/14/15 1015 07/15/15 0647 07/16/15 0502  NA 141  --  142  --  141  --  143  --   --  141  --  142 141  K 3.8  --  3.7  --  3.8  --  3.6  --   --  3.7  --  3.8 3.7  CL 103  --  101  --  97*  --  99*  --   --  95*  --  96* 95*  CO2 30  --  30  --  30  --  33*  --   --  36*  --  35* 32  GLUCOSE 95  --  112*  --  87  --  45*  --   --  127*  --  101* 71  BUN 21*  --  20  --  18  --  19  --   --  18  --  17 16  CREATININE 1.18  --  1.20  --  1.22  --  1.24  --  1.19 1.37*  --  1.22 1.15  CALCIUM 8.6*  --  8.7*  --  8.6*  --  8.6*  --   --  8.5*  --  8.5* 8.8*  MG  --   < > 1.6*  --   --   < >  --   < >  --   --  1.9  --  1.8  PHOS  --   --   --   < > 5.1*  --  5.3*  --   --  4.6  --  4.1 3.6  < > = values in this interval not displayed. Cardiac Enzymes: No results for input(s): CKTOTAL, CKMB, CKMBINDEX, TROPONINI in the last 72 hours. CBC:  Recent Labs Lab 07/11/15 0533 07/13/15 1428  WBC 10.3 9.4  HGB 12.1* 11.6*  HCT 38.2* 38.2*  MCV 74.3* 76.2*  PLT 353 326   PROTIME: No results for input(s): LABPROT, INR in the last 72 hours. Liver Function Tests:  Recent Labs  07/15/15 0647  07/16/15 0502  ALBUMIN 2.2* 2.4*   No results for input(s): LIPASE, AMYLASE in the last 72 hours. BNP: BNP (last 3 results)  Recent Labs  07/08/15 1825  BNP 1764.5*      ASSESSMENT AND PLAN:  Principal Problem:   Acute systolic CHF (congestive heart failure) ([facility]) Active Problems:   HYPERTENSION, BENIGN SYSTEMIC   AKI (acute kidney injury) ([place])   CHF (congestive heart failure) ([facility])   Dyspnea   Pressure ulcer   Elevated troponin   Scrotal swelling   Congestive dilated cardiomyopathy ([facility])   Protein calorie malnutrition ([facility])   Congestive heart failure ([place]) Would continue IV diuresis until the BUN and creatinine start to follow We'll increase carvedilol gently with hypertension Will get PT consult for crutches    Signed, Virl Axe MD  07/16/2015

## 2015-07-16 NOTE — Progress Notes (Signed)
Family Medicine Teaching Service Daily Progress Note Intern Pager: [phone]  Patient name: [name] Medical record number: [id number] Date of birth: 01-21-1956 Age: 60 y.o. Gender: male  Primary Care Provider: Tawanna Sat, MD Consultants: none Code Status: Full   Pt Overview and Major Events to Date:  5/5: admitted for new onset HF   Assessment and Plan: [name] is a 60 y.o. male presenting with new onset CHF. PMH is significant for hypertension, schizophrenia, diabetes mellitus with peripheral neuropathy.    CHF:  New onset. HFrEF 15% NICM, Continuing to diurese. Still fluid overload with JVD above mandible.  - appreciate cards recs - Cr better.  - Cards recs regarding Life Vest /AICD - May need one more day of IV lasix - Follow BMET - Continue coreg 25 mg BID, spironolactone 12.5mg  daily, Hydralazine 25mg  TID, Lipitor 40 mg QD. ASA 81 mg daily, Lisinopril - PT/OT - Dietary consult   HTN: Improved  -  lisinopril at 2.5 mg. - Hydralazine 25mg  TID, Coreg, Aldactone.     Scrotal/penile swelling: Markedly improved.  T2DM, uncontrolled: Last A1c 12.3 on 07/06/15.  -CBG 85 this am -Reduced Lantus. Novolog was restarted, but will d/c again.  -Continue to monitor CBGs  Protein Calorie Malnutrition- Moderate -Albumin 2.2 -Appreciate RD recs -Ensure patient dietary education prior to d/c   Schizophrenia - stable  Constipation. Resolved - Monitor  Knee wounds and S/p R BKA. Patient has been crawling around on his knees at home to get around. Not infected.  Not painful. Worsened by protein malnutrition. Seen by [place] who discussed with Dr. Sharol Given. Also felt they were not infected, and dressing with use of knee pads at home for now. F/U with Dr. Sharol Given as o/p planned.  - Amputation and previous surgical care with ortho.  - HH/ Care Management at d/c to make sure he has appropriate help and ambulatory devices at home.  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 Mild.  - ACE I as above  - Follow as outpatient. / Workup as indicated.   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 Pupil non-reactive and dilated. Followed by ophtho as an outpatient. - Continue home Brimodine, Cosopt, and visine drops in left eye.   FEN/GI: Heart health/carb modified, KVO  PPx: Sub Q heparin  Disposition: pending further diuresis.   Subjective:  Much better, scrotal swelling better. Appetite better. Feels "lighter".  Objective: Temp:  [98.3 F (36.8 C)-98.6 F (37 C)] 98.6 F (37 C) (05/13 0515) Pulse Rate:  [60-64] 64 (05/13 0515) Resp:  [18-20] 18 (05/13 0515) BP: (113-136)/(77-89) 130/81 mmHg (05/13 0515) SpO2:  [95 %-96 %] 95 % (05/13 0515) Weight:  [204 lb (92.534 kg)-214 lb 12.8 oz (97.433 kg)] 204 lb (92.534 kg) (05/13 0519)  Physical Exam:   General: NAD, sitting up in chair eating breakfast Cardiovascular: RRR, No MGR , JVD to the mandible sitting upright. +HJR, Edema noted below.  Respiratory: CTA BL, appropriate rate, unlabored Extremities: right BKA, 3+ edema thighs, Ulcerations noted on both knees without erythema, and base covered with granulation tissue.Stage 2.  G/U: Scrotal and Penile edema with markedly improved swelling.   Laboratory:  Recent Labs Lab 07/11/15 0533 07/13/15 1428  WBC 10.3 9.4  HGB 12.1* 11.6*  HCT 38.2* 38.2*  PLT 353 326    Recent Labs Lab 07/14/15 0307 07/15/15 0647 07/16/15 0502  NA 141 142  141  K 3.7 3.8 3.7  CL 95* 96* 95*  CO2 36* 35* 32  BUN 18 17 16   CREATININE 1.37* 1.22 1.15  CALCIUM 8.5* 8.5* 8.8*  GLUCOSE 127* 101* 71     Imaging/Diagnostic Tests: None  Aquilla Hacker, MD 07/16/2015, 8:45 AM PGY 2 Family Medicine

## 2015-07-17 LAB — RENAL FUNCTION PANEL
Albumin: 2.6 g/dL — ABNORMAL LOW (ref 3.5–5.0)
Anion gap: 11 (ref 5–15)
BUN: 18 mg/dL (ref 6–20)
CO2: 33 mmol/L — ABNORMAL HIGH (ref 22–32)
Calcium: 8.7 mg/dL — ABNORMAL LOW (ref 8.9–10.3)
Chloride: 96 mmol/L — ABNORMAL LOW (ref 101–111)
Creatinine, Ser: 1.1 mg/dL (ref 0.61–1.24)
GFR calc Af Amer: >60 mL/min (ref >60)
GFR calc non Af Amer: >60 mL/min (ref >60)
Glucose, Bld: 184 mg/dL — ABNORMAL HIGH (ref 65–99)
Phosphorus: 3.3 mg/dL (ref 2.5–4.6)
Potassium: 3.8 mmol/L (ref 3.5–5.1)
Sodium: 140 mmol/L (ref 135–145)

## 2015-07-17 LAB — GLUCOSE, CAPILLARY
Glucose-Capillary: 144 mg/dL — ABNORMAL HIGH (ref 65–99)
Glucose-Capillary: 252 mg/dL — ABNORMAL HIGH (ref 65–99)
Glucose-Capillary: 219 mg/dL — ABNORMAL HIGH (ref 65–99)
Glucose-Capillary: 232 mg/dL — ABNORMAL HIGH (ref 65–99)

## 2015-07-17 MED ORDER — LISINOPRIL 5 MG PO TABS
5.0000 mg | ORAL_TABLET | Freq: Every day | ORAL | Status: DC
  Administered 2015-07-18 – 2015-07-20 (×3): 5 mg via ORAL
  Filled 2015-07-17 (×3): qty 1

## 2015-07-17 NOTE — Progress Notes (Signed)
Patient Name: [name]      SUBJECTIVE: Admitted 5/6*17 with acute heart failure and found to have severe LV dysfunction-15%. This occurs in the context of diabetes and hypertension prior lower extremity amputation  He has undergone diuresis. Catheterization demonstrated no obstructive disease with markedly elevated filling pressures and ongoing diuresis was recommended  Thus far he is diuresed about 35 pounds  Breathing much better  Past Medical History  Diagnosis Date  . Diabetes mellitus   . Hypertension   . Congestive dilated cardiomyopathy ([place]) 07/11/2015    Scheduled Meds:  Scheduled Meds: . aspirin EC  81 mg Oral Daily  . atorvastatin  40 mg Oral q1800  . brimonidine  1 drop Left Eye BID  . carvedilol  25 mg Oral BID WC  . dorzolamide-timolol  1 drop Left Eye BID  . feeding supplement (PRO-STAT SUGAR FREE 64)  30 mL Oral Daily  . furosemide  80 mg Intravenous BID  . heparin subcutaneous  5,000 Units Subcutaneous Q8H  . hydrALAZINE  25 mg Oral Q8H  . insulin glargine  8 Units Subcutaneous q morning - 10a  . isosorbide mononitrate  30 mg Oral Daily  . lisinopril  2.5 mg Oral Daily  . multivitamin with minerals  1 tablet Oral Daily  . nutrition supplement (JUVEN)  1 packet Oral BID WC  . sodium chloride flush  3 mL Intravenous Q12H  . sodium chloride flush  3 mL Intravenous Q12H  . sodium chloride flush  3 mL Intravenous Q12H  . spironolactone  12.5 mg Oral Daily  . tamsulosin  0.8 mg Oral QPC supper  . tetrahydrozoline  1 drop Left Eye BID   Continuous Infusions:  sodium chloride, sodium chloride, acetaminophen **OR** acetaminophen, HYDROcodone-acetaminophen, ondansetron (ZOFRAN) IV, sodium chloride flush, sodium chloride flush, sodium chloride flush    PHYSICAL EXAM Filed Vitals:   07/16/15 2017 07/17/15 0443 07/17/15 0500 07/17/15 1214  BP: 127/78 155/97  127/63  Pulse: 66 68  63  Temp: 98.6 F (37 C) 98.4 F (36.9 C)  98.4 F (36.9 C)    TempSrc: Oral Oral  Oral  Resp: 16 18  20   Height:      Weight:   196 lb 8 oz (89.132 kg)   SpO2: 96% 95%  94%    Well developed and nourished in no acute distress HENT normal Neck supple  Clear Regular rate and rhythm, no murmurs or gallops Abd-soft with active BS No Clubbing cyanosis 1-2+edema Knees wounded from walking on them Skin-warm and dry A & Oriented  Grossly normal sensory and motor function   TELEMETRY: Reviewed telemetry pt in nsr    Intake/Output Summary (Last 24 hours) at 07/17/15 1300 Last data filed at 07/17/15 1259  Gross per 24 hour  Intake    960 ml  Output   6075 ml  Net  -5115 ml    LABS: Basic Metabolic Panel:  Recent Labs Lab 07/11/15 0335  07/12/15 0525  07/13/15 0332  07/13/15 1428 07/14/15 0307 07/14/15 1015 07/15/15 0647 07/16/15 0502 07/17/15 0448  NA 142  --  141  --  143  --   --  141  --  142 141 140  K 3.7  --  3.8  --  3.6  --   --  3.7  --  3.8 3.7 3.8  CL 101  --  97*  --  99*  --   --  95*  --  96* 95* 96*  CO2 30  --  30  --  33*  --   --  36*  --  35* 32 33*  GLUCOSE 112*  --  87  --  45*  --   --  127*  --  101* 71 184*  BUN 20  --  18  --  19  --   --  18  --  17 16 18   CREATININE 1.20  --  1.22  --  1.24  --  1.19 1.37*  --  1.22 1.15 1.10  CALCIUM 8.7*  --  8.6*  --  8.6*  --   --  8.5*  --  8.5* 8.8* 8.7*  MG 1.6*  --   --   < >  --   < >  --   --  1.9  --  1.8  --   PHOS  --   < > 5.1*  --  5.3*  --   --  4.6  --  4.1 3.6 3.3  < > = values in this interval not displayed. Cardiac Enzymes: No results for input(s): CKTOTAL, CKMB, CKMBINDEX, TROPONINI in the last 72 hours. CBC:  Recent Labs Lab 07/11/15 0533 07/13/15 1428  WBC 10.3 9.4  HGB 12.1* 11.6*  HCT 38.2* 38.2*  MCV 74.3* 76.2*  PLT 353 326   PROTIME: No results for input(s): LABPROT, INR in the last 72 hours. Liver Function Tests:  Recent Labs  07/16/15 0502 07/17/15 0448  ALBUMIN 2.4* 2.6*   No results for input(s): LIPASE, AMYLASE in  the last 72 hours. BNP: BNP (last 3 results)  Recent Labs  07/08/15 1825  BNP 1764.5*      ASSESSMENT AND PLAN:  Principal Problem:   Acute systolic CHF (congestive heart failure) (HCC) Active Problems:   HYPERTENSION, BENIGN SYSTEMIC   AKI (acute kidney injury) ([place])   CHF (congestive heart failure) (HCC)   Dyspnea   Pressure ulcer   Elevated troponin   Scrotal swelling   Congestive dilated cardiomyopathy (HCC)   Protein calorie malnutrition (HCC)   Congestive heart failure ([place]) Would continue IV diuresis until the BUN and creatinine start to follow We'll increase lisinopril gently with hypertension Will get PT consult for crutches  Continue current recs  Signed, Virl Axe MD  07/17/2015

## 2015-07-17 NOTE — Progress Notes (Signed)
Family Medicine Teaching Service Daily Progress Note Intern Pager: [phone]  Patient name: [name] Medical record number: [id number] Date of birth: [birth year] Age: 60 y.o. Gender: male  Primary Care Provider: Tawanna Sat, MD Consultants: none Code Status: Full   Pt Overview and Major Events to Date:  5/5: admitted for new onset HF  5/10: Heart cath  Assessment and Plan: [name] is a 60 y.o. male presenting with new onset CHF. PMH is significant for hypertension, schizophrenia, diabetes mellitus with peripheral neuropathy.    CHF:  New onset. HFrEF 15%, cath with NICM and pulmonary HTN (left sideded), Continuing to diurese. Still fluid overload pitting edema, however JVD appears to have improved.  - appreciate cards recs - Cr improved to 1.1.  - Cards recs regarding Life Vest /AICD - May need one more day of IV lasix, then consider transition to PO on 5/15.  - Follow BMET - Continue coreg 25 mg BID, spironolactone 12.5mg  daily, Hydralazine 25mg  TID, Lipitor 40 mg QD. ASA 81 mg daily, Lisinopril 2.5mg , Imdur 30mg . - daily wts: 244>>>204>196 - strict I&Os - PT/OT - Dietary consult   HTN: Improved, 130-150s/80-90s -  lisinopril at 2.5 mg; consider increasing to 5mg  if continues to remain elevated.  - Hydralazine 25mg  TID, Coreg, Aldactone.     Scrotal/penile swelling: Markedly improved.  T2DM, uncontrolled: Last A1c 12.3 on 07/06/15.  -CBG 144 this am -Lantus 8 units qAM. Not on SSI.   -Continue to monitor CBGs  Protein Calorie Malnutrition- Moderate -Albumin 2.6 -Appreciate RD recs -Ensure patient dietary education prior to d/c   Schizophrenia - stable off meds  Constipation. Resolved - Monitor  Knee wounds and S/p R BKA. Patient has been crawling around on his knees at home to get around.  Worsened by protein malnutrition. Seen by [place] who discussed with Dr. Sharol Given. Felt they were not infected, and dressing with use of knee pads at home for now. F/U with  Dr. Sharol Given as o/p planned.  - Amputation and previous surgical care with ortho.  - HH/ Care Management at d/c to make sure he has appropriate help and ambulatory devices at home.  -Ortho techs c/s for compression sock for R leg -Crutches for home.   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 Mild.  - ACE-I as above  - Follow as outpatient. / Workup as indicated.   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 Pupil non-reactive and dilated. Followed by ophtho as an outpatient. - Continue home Brimodine, Cosopt, and visine drops in left eye.   FEN/GI: Heart health/carb modified, KVO  PPx: Sub Q heparin  Disposition: pending further diuresis.   Subjective:  Patient doing well. No complaints. Breathing comfortably, swelling improved.   Objective: Temp:  [97.9 F (36.6 C)-98.6 F (37 C)] 98.4 F (36.9 C) (05/14 0443) Pulse Rate:  [63-68] 68 (05/14 0443) Resp:  [16-20] 18 (05/14 0443) BP: (127-155)/(78-97) 155/97 mmHg (05/14 0443) SpO2:  [95 %-100 %] 95 % (05/14 0443) Weight:  [196 lb 8 oz (89.132 kg)] 196 lb 8 oz (89.132 kg) (05/14 0500)  Physical Exam:   General: NAD, sitting up on side of bed. Cardiovascular: RRR, No MGR , JVD ~7, no HJR.  Respiratory: CTAB, appropriate rate, no increased WOB.  Extremities: right BKA, 1+ pitting edema of thighs, Ulcerations noted on both knees without erythema, or drainage and base covered with granulation tissue. Stage 2.  Intake/Output Summary (Last 24 hours) at 07/17/15 1022 Last data filed at 07/17/15 0929  Gross per 24 hour  Intake    960 ml  Output   4600 ml  Net  -3640 ml     Laboratory:  Recent Labs Lab 07/11/15 0533 07/13/15 1428  WBC 10.3 9.4  HGB 12.1* 11.6*  HCT 38.2* 38.2*  PLT 353 326    Recent Labs Lab 07/15/15 0647 07/16/15 0502 07/17/15 0448  NA 142 141 140  K 3.8 3.7 3.8  CL 96* 95* 96*  CO2 35* 32 33*  BUN 17  16 18   CREATININE 1.22 1.15 1.10  CALCIUM 8.5* 8.8* 8.7*  GLUCOSE 101* 71 184*     Imaging/Diagnostic Tests: None  Archie Patten, MD 07/17/2015, 7:26 AM PGY- 2, Family Medicine Resident

## 2015-07-18 DIAGNOSIS — I5042 Chronic combined systolic (congestive) and diastolic (congestive) heart failure: Secondary | ICD-10-CM | POA: Insufficient documentation

## 2015-07-18 LAB — BASIC METABOLIC PANEL
Anion gap: 8 (ref 5–15)
BUN: 16 mg/dL (ref 6–20)
CO2: 35 mmol/L — ABNORMAL HIGH (ref 22–32)
Calcium: 8.8 mg/dL — ABNORMAL LOW (ref 8.9–10.3)
Chloride: 95 mmol/L — ABNORMAL LOW (ref 101–111)
Creatinine, Ser: 1.2 mg/dL (ref 0.61–1.24)
GFR calc Af Amer: >60 mL/min (ref >60)
GFR calc non Af Amer: >60 mL/min (ref >60)
Glucose, Bld: 217 mg/dL — ABNORMAL HIGH (ref 65–99)
Potassium: 3.7 mmol/L (ref 3.5–5.1)
Sodium: 138 mmol/L (ref 135–145)

## 2015-07-18 LAB — GLUCOSE, CAPILLARY
Glucose-Capillary: 213 mg/dL — ABNORMAL HIGH (ref 65–99)
Glucose-Capillary: 264 mg/dL — ABNORMAL HIGH (ref 65–99)
Glucose-Capillary: 305 mg/dL — ABNORMAL HIGH (ref 65–99)
Glucose-Capillary: 176 mg/dL — ABNORMAL HIGH (ref 65–99)

## 2015-07-18 LAB — MAGNESIUM: Magnesium: 1.8 mg/dL (ref 1.7–2.4)

## 2015-07-18 MED ORDER — INSULIN ASPART 100 UNIT/ML ~~LOC~~ SOLN
0.0000 [IU] | Freq: Three times a day (TID) | SUBCUTANEOUS | Status: DC
  Administered 2015-07-19 (×2): 3 [IU] via SUBCUTANEOUS
  Administered 2015-07-19 – 2015-07-20 (×2): 2 [IU] via SUBCUTANEOUS
  Administered 2015-07-20: 3 [IU] via SUBCUTANEOUS

## 2015-07-18 MED ORDER — INSULIN GLARGINE 100 UNIT/ML ~~LOC~~ SOLN
10.0000 [IU] | Freq: Every morning | SUBCUTANEOUS | Status: DC
  Administered 2015-07-19: 10 [IU] via SUBCUTANEOUS
  Filled 2015-07-18: qty 0.1

## 2015-07-18 MED ORDER — MAGNESIUM SULFATE 2 GM/50ML IV SOLN
2.0000 g | Freq: Once | INTRAVENOUS | Status: AC
  Administered 2015-07-18: 2 g via INTRAVENOUS
  Filled 2015-07-18: qty 50

## 2015-07-18 MED ORDER — POLYETHYLENE GLYCOL 3350 17 G PO PACK
17.0000 g | PACK | Freq: Every day | ORAL | Status: DC
  Administered 2015-07-18 – 2015-07-19 (×2): 17 g via ORAL
  Filled 2015-07-18 (×3): qty 1

## 2015-07-18 MED ORDER — POTASSIUM CHLORIDE CRYS ER 20 MEQ PO TBCR
40.0000 meq | EXTENDED_RELEASE_TABLET | Freq: Once | ORAL | Status: AC
  Administered 2015-07-18: 40 meq via ORAL
  Filled 2015-07-18: qty 2

## 2015-07-18 MED ORDER — SPIRONOLACTONE 25 MG PO TABS
25.0000 mg | ORAL_TABLET | Freq: Every day | ORAL | Status: DC
  Administered 2015-07-19 – 2015-07-20 (×2): 25 mg via ORAL
  Filled 2015-07-18 (×2): qty 1

## 2015-07-18 NOTE — Progress Notes (Signed)
Family Medicine Teaching Service Daily Progress Note Intern Pager: [phone]  Patient name: [name] Medical record number: [id number] Date of birth: [birth year] Age: 60 y.o. Gender: male  Primary Care Provider: Tawanna Sat, MD Consultants: none Code Status: Full   Pt Overview and Major Events to Date:  5/5: admitted for new onset HF  5/10: Heart cath  Assessment and Plan: MOISHY ROMM is a 60 y.o. male presenting with new onset CHF. PMH is significant for hypertension, schizophrenia, diabetes mellitus with peripheral neuropathy.    CHF:  New onset. HFrEF 15%, cath with NICM and pulmonary HTN Group II, Continuing to diurese. JVD improved to 3 fingers above clavicle. Yesterday had Uo net 6.54 L.  - Cr 1.2. - Appreciate cards recs; Awaiting recs regarding Life Vest /AICD - Continue IV lasix, until BUN & Cr bump, per cards; Following BMET - Continue coreg 25 mg BID, spironolactone 12.5mg  daily, Hydralazine 25mg  TID, Lipitor 40 mg QD. ASA 81 mg daily, Lisinopril 5mg , Imdur 30mg . - daily wts: 244>>>204>196>>186.56 lbs (55 lbs down from admission on 5/5) - strict I&Os  Electrolytes: Goal Mg> 2; K >4 - Mg 1.8; 2 g Magnesium ordered - K 3.7; KDUR 40 mEq ordered  T2DM, uncontrolled: Last A1c 12.3 on 07/06/15.  -CBG 217 this am -Lantus 8 units qAM. Not on SSI.  Increased to 9 units qAM given increased morning blood sugars.  -Check 2 hrs pp sugars and assess need for mealtime insulin -Continue to monitor CBGs  Pulmonary HTN: Patient endorses difficulty taking breath that is not 2/2 pain. Etiology: Left HF vs. Intrinsic lung disease (restrictive lung disease) vs. Sleep apnea -CXR did not show increased opacity indicative of restrictive lung disease, less likely but possible -Follow-up PFTs and Sleep study as outpatient   HTN: Improved, 130-150s/80-90s - lisinopril at 5 mg  - Hydralazine 25mg  TID, Coreg, Aldactone.     Scrotal/penile swelling: Almost resolved.   Protein Calorie  Malnutrition- Moderate -Albumin 2.6 -Appreciate RD recs -Ensure patient dietary education prior to d/c   Schizophrenia - stable off meds  Constipation. Last BM 2 days ago. - Miralax ordered.   Knee wounds and S/p R BKA. Patient crawls on his knees at home. Worsened by protein malnutrition. Seen by [place] who discussed with Dr. Sharol Given. Not infected, and dressing with use of knee pads at home for now. F/U with Dr. Sharol Given as o/p planned.  - Amputation and previous surgical care with ortho.  - HH/ Care Management at d/c to make sure he has appropriate help and ambulatory devices at home.  -Ortho techs c/s for compression sock for R leg -Crutches for home.   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 Mild.  - ACE-I as above  - Follow as outpatient. / Workup as indicated.   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 Pupil non-reactive and dilated. Followed by ophtho as an outpatient. - Continue home Brimodine, Cosopt, and visine drops in left eye.   FEN/GI: Heart health/carb modified, KVO  PPx: Sub Q heparin  Disposition: pending further diuresis.   Subjective:  Patient doing well. No complaints. Breathing comfortably, swelling improved. No more scrotal pain even with movement.  Objective: Temp:  [98 F (36.7 C)-98.4 F (36.9 C)] 98.2 F (36.8 C) (05/15 0618) Pulse Rate:  [63-66] 66 (05/15 0618) Resp:  [19-20] 19 (05/15 0618) BP: (127-137)/(63-95) 137/95 mmHg (05/15 0618) SpO2:  [94 %-96 %]  94 % (05/15 0618) Weight:  [84.777 kg (186 lb 14.4 oz)] 84.777 kg (186 lb 14.4 oz) (05/15 0618)  Physical Exam:   General: NAD, sitting up on side of bed. Cardiovascular: RRR, Systolic murmur appreciated. JVD 3 fingers above clavicle.  Respiratory: CTAB, appropriate rate, no increased WOB but shallow inspiration.  Extremities: right BKA, 1+ pitting edema of L foot up to half-way through calf.  Ulcerations noted on both knees without erythema, or drainage and base covered with granulation tissue. Stage 2.    Intake/Output Summary (Last 24 hours) at 07/18/15 0808 Last data filed at 07/18/15 0742  Gross per 24 hour  Intake    960 ml  Output   7500 ml  Net  -6540 ml     Laboratory:  Recent Labs Lab 07/13/15 1428  WBC 9.4  HGB 11.6*  HCT 38.2*  PLT 326    Recent Labs Lab 07/16/15 0502 07/17/15 0448 07/18/15 0337  NA 141 140 138  K 3.7 3.8 3.7  CL 95* 96* 95*  CO2 32 33* 35*  BUN 16 18 16   CREATININE 1.15 1.10 1.20  CALCIUM 8.8* 8.7* 8.8*  GLUCOSE 71 184* 217*     Imaging/Diagnostic Tests: EKG: Pending read  [name], Med Student 07/18/2015, 8:08 AM MS4, Family Medicine Resident   I agree with the above evaluation, assessment, and plan. Any correctional changes can be noted in [facility]   Aquilla Hacker, MD Family Medicine Resident - PGY 2  S: Continues to feel better. Diuresing well. CBG's up a bit  O:  Filed Vitals:   07/18/15 0618 07/18/15 1112  BP: 137/95 125/74  Pulse: 66 67  Temp: 98.2 F (36.8 C) 98.2 F (36.8 C)  Resp: 19 18  Gen: Resting in bed comfortably CV: RRR, intermittent pvc's, No MGR, 2+ distal pulses, JVD 6-7cm  Pulm: CTA B, appropriate rate, good air movement Abd: S, NT, ND, no bowel wall edema G/U: Scrotal edema markedly improved Ext: 1+ peripheral edema, much less than before, However, 2+ thigh edema.  Skin: Ulcerations with eschar of BL LE stable and dressing applied.   A/P: 60 y/o M with NICM HFrEF 15% Diuresing well - Increased dose of Lisinopril with first dose of 5mg  this am.  - Continue IV diuresis - F/U with Cards re AICD/Life vest - Ace wrap to BKA

## 2015-07-18 NOTE — Progress Notes (Signed)
Physical Therapy Treatment Patient Details Name: [name] MRN: [id number] DOB: 01/26/1956 Today's Date: 07/18/2015    History of Present Illness Patient is a 60 yo male admitted 07/08/15 with swelling - new onset CHF (including scrotal edema and inability to don prosthesis); wounds bil knees from walking on his knees; 5/10 Rt and Lt heart catheterizaiton via Rt radial artery  PMH:  HTN, schizophrenia, DM, peripheral neuropathy, Rt BKA, blind Lt eye    PT Comments    [name] made good progress today, ambulating 90 ft in hallway w/ min guard assist.  He required 2 standing rest breaks due to Bil UE fatigue.  Pt remains aware that he should not ambulate on his knees when he returns home.  Discussed again w/ pt the benefits and disadvantages to using crutches and pt reported that he was not interested in using crutches, feels that RW will be more steady.  Pt will benefit from continued skilled PT services to increase functional independence and safety.   Follow Up Recommendations  Home health PT;Supervision - Intermittent (home safety evaluation--cannot use prosthesis)     Equipment Recommendations  Other (comment) (sliding board)    Recommendations for Other Services       Precautions / Restrictions Precautions Precautions: Fall Precaution Comments: Has Rt prosthesis that doesn't fit. States he is going to see [name] and [name] prosthetics at appt in 2 weeks Restrictions Weight Bearing Restrictions: No    Mobility  Bed Mobility Overal bed mobility: Modified Independent             General bed mobility comments: Increased time w/ HOB elevated and use of bed rail.  Transfers Overall transfer level: Needs assistance Equipment used: Rolling walker (2 wheeled) Transfers: Sit to/from Stand Sit to Stand: Min assist         General transfer comment: Assist to steady, pt w/ safe technique.  Ambulation/Gait Ambulation/Gait assistance: Min guard Ambulation Distance  (Feet): 90 Feet Assistive device: Rolling walker (2 wheeled) Gait Pattern/deviations:  (hop on Lt LE)   Gait velocity interpretation: Below normal speed for age/gender General Gait Details: 2 standing rest breaks due to UE fatigue.  Cues to take smaller step as his Lt LE was moving forward past front rail of RW, placing him at risk for posterior fall.     Stairs            Wheelchair Mobility    Modified Rankin (Stroke Patients Only)       Balance Overall balance assessment: Needs assistance Sitting-balance support: No upper extremity supported;Feet supported Sitting balance-Leahy Scale: Normal     Standing balance support: Bilateral upper extremity supported;During functional activity Standing balance-Leahy Scale: Poor Standing balance comment: Relies on RW for support                    Cognition Arousal/Alertness: Awake/alert Behavior During Therapy: WFL for tasks assessed/performed Overall Cognitive Status: Within Functional Limits for tasks assessed                      Exercises General Exercises - Lower Extremity Quad Sets: Strengthening;Both;10 reps;Seated Long Arc Quad: AROM;Both;10 reps;Seated    General Comments General comments (skin integrity, edema, etc.): Discussed again w/ pt the benefits and disadvantages to using crutches and pt reported that he was not interested in using crutches, feels that RW will be more steady.      Pertinent Vitals/Pain Pain Assessment: 0-10 Pain Score: 3  Pain Location:  scrotum, groin Pain Descriptors / Indicators: Discomfort Pain Intervention(s): Limited activity within patient's tolerance;Monitored during session;Repositioned    Home Living                      Prior Function            PT Goals (current goals can now be found in the care plan section) Acute Rehab PT Goals Patient Stated Goal: improve his strength Progress towards PT goals: Progressing toward goals    Frequency   Min 3X/week    PT Plan Current plan remains appropriate    Co-evaluation             End of Session Equipment Utilized During Treatment: Gait belt Activity Tolerance: Patient tolerated treatment well;Patient limited by fatigue Patient left: in chair;with call bell/phone within reach;with chair alarm set     Time: [id number] PT Time Calculation (min) (ACUTE ONLY): 20 min  Charges:  $Gait Training: 8-22 mins                    G Codes:      Collie Siad PT, DPT  Pager: [phone] Phone: [phone] 07/18/2015, 10:27 AM

## 2015-07-18 NOTE — Progress Notes (Signed)
Occupational Therapy Treatment Patient Details Name: [name] MRN: [id number] DOB: [birth year] Today's Date: 07/18/2015    History of present illness Patient is a 60 yo male admitted 07/08/15 with swelling - new onset CHF (including scrotal edema and inability to don prosthesis); wounds bil knees from walking on his knees; 5/10 Rt and Lt heart catheterizaiton via Rt radial artery  PMH:  HTN, schizophrenia, DM, peripheral neuropathy, Rt BKA, blind Lt eye   OT comments  Pt. Seen for LB ADL and energy conservation/compensatory techniques and strategies.  Will continue to follow acutely.    Follow Up Recommendations  Home health OT;Supervision - Intermittent    Equipment Recommendations  None recommended by OT    Recommendations for Other Services      Precautions / Restrictions Precautions Precautions: Fall Precaution Comments: Has Rt prosthesis that doesn't fit. States he is going to see Dr. Sharol Given and [facility] at appt in 2 weeks Restrictions Weight Bearing Restrictions: No       Mobility Bed Mobility Overal bed mobility: Modified Independent             General bed mobility comments: pt. up in recliner upon arrival  Transfers Overall transfer level: Needs assistance Equipment used: Rolling walker (2 wheeled) Transfers: Sit to/from Stand Sit to Stand: Min assist         General transfer comment: Assist to steady, pt w/ safe technique.    Balance Overall balance assessment: Needs assistance Sitting-balance support: No upper extremity supported;Feet supported Sitting balance-Leahy Scale: Normal     Standing balance support: Bilateral upper extremity supported;During functional activity Standing balance-Leahy Scale: Poor Standing balance comment: Relies on RW for support                   ADL Overall ADL's : Needs assistance/impaired             Lower Body Bathing: Set up;Sitting/lateral leans                          General ADL Comments: pt. performed LB pericare in sitting, with set up.  noted strong smell of urine upon arrival.  pt. denies any spillage of urinal or incontinence.  educated on importance of maintaining skin integrity.  reviewed energy conservation techniques ie: bathing sititng vs standing if not able.        Vision                     Perception     Praxis      Cognition   Behavior During Therapy: WFL for tasks assessed/performed Overall Cognitive Status: Within Functional Limits for tasks assessed                       Extremity/Trunk Assessment               Exercises General Exercises - Lower Extremity Quad Sets: Strengthening;Both;10 reps;Seated Long Arc Quad: AROM;Both;10 reps;Seated   Shoulder Instructions       General Comments      Pertinent Vitals/ Pain       Pain Assessment: No/denies pain Pain Score: 3  Pain Location: scrotum, groin Pain Descriptors / Indicators: Discomfort Pain Intervention(s): Limited activity within patient's tolerance;Monitored during session;Repositioned  Home Living  Prior Functioning/Environment              Frequency Min 2X/week     Progress Toward Goals  OT Goals(current goals can now be found in the care plan section)  Progress towards OT goals: Progressing toward goals  Acute Rehab OT Goals Patient Stated Goal: improve his strength  Plan Discharge plan remains appropriate    Co-evaluation                 End of Session     Activity Tolerance Patient tolerated treatment well   Patient Left in chair;with call bell/phone within reach   Nurse Communication          Time: [id number] OT Time Calculation (min): 11 min  Charges: OT General Charges $OT Visit: 1 Procedure OT Treatments $Self Care/Home Management : 8-22 mins  Janice Coffin, COTA/L 07/18/2015, 11:12 AM

## 2015-07-18 NOTE — Progress Notes (Signed)
Inpatient Diabetes Program Recommendations  AACE/ADA: New Consensus Statement on Inpatient Glycemic Control (2015)  Target Ranges:  Prepandial:   less than 140 mg/dL      Peak postprandial:   less than 180 mg/dL (1-2 hours)      Critically ill patients:  140 - 180 mg/dL   Review of Glycemic Control  Please consider adding Novolog sensitive scale TID.   Thank you  Raoul Pitch BSN, RN,CDE Inpatient Diabetes Coordinator [phone] (team pager)

## 2015-07-18 NOTE — Progress Notes (Signed)
Patient Name: [name] Date of Encounter: 07/18/2015  Hospital Problem List     Principal Problem:   Acute systolic CHF (congestive heart failure) (HCC) Active Problems:   HYPERTENSION, BENIGN SYSTEMIC   AKI (acute kidney injury) ([place])   CHF (congestive heart failure) (HCC)   Dyspnea   Pressure ulcer   Elevated troponin   Scrotal swelling   Congestive dilated cardiomyopathy (HCC)   Protein calorie malnutrition (HCC)   Congestive heart failure (HCC)    Subjective   Feeling well this morning. No chest pain, breathing has greatly improved over the past couple of days. OOB to chair.   Inpatient Medications    . aspirin EC  81 mg Oral Daily  . atorvastatin  40 mg Oral q1800  . brimonidine  1 drop Left Eye BID  . carvedilol  25 mg Oral BID WC  . dorzolamide-timolol  1 drop Left Eye BID  . feeding supplement (PRO-STAT SUGAR FREE 64)  30 mL Oral Daily  . furosemide  80 mg Intravenous BID  . heparin subcutaneous  5,000 Units Subcutaneous Q8H  . hydrALAZINE  25 mg Oral Q8H  . [START ON 07/19/2015] insulin glargine  10 Units Subcutaneous q morning - 10a  . isosorbide mononitrate  30 mg Oral Daily  . lisinopril  5 mg Oral Daily  . magnesium sulfate 1 - 4 g bolus IVPB  2 g Intravenous Once  . multivitamin with minerals  1 tablet Oral Daily  . nutrition supplement (JUVEN)  1 packet Oral BID WC  . polyethylene glycol  17 g Oral Daily  . sodium chloride flush  3 mL Intravenous Q12H  . sodium chloride flush  3 mL Intravenous Q12H  . sodium chloride flush  3 mL Intravenous Q12H  . spironolactone  12.5 mg Oral Daily  . tamsulosin  0.8 mg Oral QPC supper  . tetrahydrozoline  1 drop Left Eye BID    Vital Signs    Filed Vitals:   07/17/15 0500 07/17/15 1214 07/17/15 1948 07/18/15 0618  BP:  127/63 129/66 137/95  Pulse:  63 66 66  Temp:  98.4 F (36.9 C) 98 F (36.7 C) 98.2 F (36.8 C)  TempSrc:  Oral Oral Oral  Resp:  20 20 19   Height:      Weight: 196 lb 8 oz  (89.132 kg)   186 lb 14.4 oz (84.777 kg)  SpO2:  94% 96% 94%    Intake/Output Summary (Last 24 hours) at 07/18/15 1016 Last data filed at 07/18/15 1003  Gross per 24 hour  Intake   1080 ml  Output   8300 ml  Net  -7220 ml   Filed Weights   07/16/15 0519 07/17/15 0500 07/18/15 0618  Weight: 204 lb (92.534 kg) 196 lb 8 oz (89.132 kg) 186 lb 14.4 oz (84.777 kg)    Physical Exam    General: Pleasant AA male, NAD. Neuro: Alert and oriented X 3. Moves all extremities spontaneously. Psych: Normal affect. HEENT:  Normal  Neck: Supple without bruits or JVD. Lungs:  Resp regular and unlabored, CTA. Heart: RRR no s3, s4, or murmurs. Abdomen: Soft, non-tender, non-distended, BS + x 4.  Extremities: No clubbing, cyanosis, 1+edema. DP/PT/Radials 2+. Right BKA  Labs    Basic Metabolic Panel  Recent Labs  07/16/15 0502 07/17/15 0448 07/18/15 0337  NA 141 140 138  K 3.7 3.8 3.7  CL 95* 96* 95*  CO2 32 33* 35*  GLUCOSE 71 184* 217*  BUN  16 18 16   CREATININE 1.15 1.10 1.20  CALCIUM 8.8* 8.7* 8.8*  MG 1.8  --  1.8  PHOS 3.6 3.3  --    Liver Function Tests  Recent Labs  07/16/15 0502 07/17/15 0448  ALBUMIN 2.4* 2.6*    Telemetry    SR Rate-60s  ECG    SR Rate-60s ST/Twave changes noted as similar to previous EKGs  Radiology     Assessment & Plan    60 yo male admitted 07/09/15 with acute heart failure and found to have severe LV dysfunction-15%. This occurs in the context of diabetes and hypertension prior lower extremity amputation He has undergone diuresis. Catheterization demonstrated no obstructive disease with markedly elevated filling pressures and ongoing diuresis was recommended  1. Acute Combined systolic and diastolic CHF: He is net -[id number] since admission and weight is down from 244>>186lbs. Cr remains stable with current dose of lasix at 80mg  IV BID. --Would continue with diuresis at this time. --On BB, hydralazine, ACE  2: HTN: Better controlled noted  this morning with current medications.   3. Dilated cardiomyopathy/NICM: Patient has diffuse hypokinesis on echo with EF 15%.Suspect this may be due to hypertensive CM since he stopped all BP meds a few months ago and diastolic BP was elevated in 100's on admission.Cath with minimal non obst CAD. Aggressive control during this admission.    4. DM: Management per primary team  5. Right BKA wound: Follow-up planned with Dr. Sharol Given  Signed, Reino Bellis NP-C Pager [phone]   I saw examined the patient along with Reino Bellis NP,-see this morning on rounds. After discussion the patient I reviewed the patient's chart and all available data. I agree with her findings, examination as well as impression recommendations. 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 I think probably this stage we can consider switching him to oral Lasix tomorrow. - He is net out close to 28 L He is already on a good dose of carvedilol and hydralazine/Imdur  (consider switching to BiDil = which is hydralazine 37.5/isosorbide dinitrate 20 mg).  Would anticipate that from a cardiac standpoint he may be ready to discharge in another day or so once we see how he does on by mouth diuretics.   Glenetta Hew, M.D., M.S. Interventional Cardiologist   Pager # [phone] Phone # [phone] [address] [place], [place] [address]

## 2015-07-19 LAB — GLUCOSE, CAPILLARY
Glucose-Capillary: 151 mg/dL — ABNORMAL HIGH (ref 65–99)
Glucose-Capillary: 221 mg/dL — ABNORMAL HIGH (ref 65–99)
Glucose-Capillary: 237 mg/dL — ABNORMAL HIGH (ref 65–99)
Glucose-Capillary: 138 mg/dL — ABNORMAL HIGH (ref 65–99)

## 2015-07-19 LAB — BASIC METABOLIC PANEL
Anion gap: 12 (ref 5–15)
BUN: 23 mg/dL — ABNORMAL HIGH (ref 6–20)
CO2: 33 mmol/L — ABNORMAL HIGH (ref 22–32)
Calcium: 9.1 mg/dL (ref 8.9–10.3)
Chloride: 93 mmol/L — ABNORMAL LOW (ref 101–111)
Creatinine, Ser: 1.16 mg/dL (ref 0.61–1.24)
GFR calc Af Amer: >60 mL/min (ref >60)
GFR calc non Af Amer: >60 mL/min (ref >60)
Glucose, Bld: 151 mg/dL — ABNORMAL HIGH (ref 65–99)
Potassium: 4.1 mmol/L (ref 3.5–5.1)
Sodium: 138 mmol/L (ref 135–145)

## 2015-07-19 LAB — MAGNESIUM: Magnesium: 2 mg/dL (ref 1.7–2.4)

## 2015-07-19 MED ORDER — FUROSEMIDE 20 MG PO TABS
60.0000 mg | ORAL_TABLET | Freq: Two times a day (BID) | ORAL | Status: DC
  Administered 2015-07-19 – 2015-07-20 (×2): 60 mg via ORAL
  Filled 2015-07-19 (×2): qty 3

## 2015-07-19 MED ORDER — INSULIN ASPART 100 UNIT/ML ~~LOC~~ SOLN
4.0000 [IU] | Freq: Three times a day (TID) | SUBCUTANEOUS | Status: DC
  Administered 2015-07-19 – 2015-07-20 (×3): 4 [IU] via SUBCUTANEOUS

## 2015-07-19 MED ORDER — FUROSEMIDE 40 MG PO TABS: 60.0000 mg | ORAL_TABLET | Freq: Two times a day (BID) | ORAL | Status: DC

## 2015-07-19 MED ORDER — GLUCERNA SHAKE PO LIQD
237.0000 mL | ORAL | Status: DC
  Administered 2015-07-19: 237 mL via ORAL

## 2015-07-19 MED ORDER — INSULIN GLARGINE 100 UNIT/ML ~~LOC~~ SOLN
8.0000 [IU] | Freq: Every morning | SUBCUTANEOUS | Status: DC
  Filled 2015-07-19: qty 0.08

## 2015-07-19 MED ORDER — FUROSEMIDE 40 MG PO TABS: 60.0000 mg | ORAL_TABLET | Freq: Every day | ORAL | Status: DC

## 2015-07-19 NOTE — Progress Notes (Signed)
Physical Therapy Treatment Patient Details Name: [name] MRN: [id number] DOB: 1955-04-16 Today's Date: 07/19/2015    History of Present Illness Patient is a 60 yo male admitted 07/08/15 with swelling - new onset CHF (including scrotal edema and inability to don prosthesis); wounds bil knees from walking on his knees; 5/10 Rt and Lt heart catheterizaiton via Rt radial artery  PMH:  HTN, schizophrenia, DM, peripheral neuropathy, Rt BKA, blind Lt eye    PT Comments    Received order for imminent discharge. Re-assessed patient's mobility and discussed home layout and safest method of mobilizing when he is unable to don his prosthesis (due to fluid shifts/status). Patient demonstrated safe use of RW for short distances--he must use it to get into his bathroom as his wheelchair will not fit into bathroom. He primarily will use wheelchair for all other locomotion until his upcoming appointment with prosthetist. Patient with no concerns re: mobility status and planned discharge home tomorrow. All questions answered. OK to discharge home from a PT perspective.   Follow Up Recommendations  Home health PT (may not qualify due to Medicaid; home safety evaluation recommended); Supervision - Intermittent (family checks on him as PTA)     Equipment Recommendations  None recommended by PT    Recommendations for Other Services       Precautions / Restrictions Precautions Precautions: Fall Precaution Comments: Has Rt prosthesis that doesn't fit. States he is going to see Dr. Sharol Given and [facility] at appt in 2 weeks Restrictions Weight Bearing Restrictions: No    Mobility  Bed Mobility                  Transfers Overall transfer level: Modified independent Equipment used: Rolling walker (2 wheeled) Transfers: Sit to/from Stand Sit to Stand: Modified independent (Device/Increase time)         General transfer comment: pt with safe technique and no cues  needed  Ambulation/Gait Ambulation/Gait assistance: Modified independent (Device/Increase time);Supervision Ambulation Distance (Feet): 15 Feet (sideways (simulated bathroom entrance); 90) Assistive device: Rolling walker (2 wheeled) Gait Pattern/deviations:  (hop on Lt LE)   Gait velocity interpretation: Below normal speed for age/gender General Gait Details: ; in room pt modified independent for short distances and stepping sideways with RW (which he has to do to enter his bathroom); on longer walk, 3 standing rest breaks due to UE fatigue.  Cues to take smaller step as his Lt LE was moving forward past front rail of RW, placing him at risk for posterior fall.     Stairs            Information systems manager mobility:  (Pt reports modified independent with no concerns)  Modified Rankin (Stroke Patients Only)       Balance                                    Cognition Arousal/Alertness: Awake/alert Behavior During Therapy: WFL for tasks assessed/performed Overall Cognitive Status: Within Functional Limits for tasks assessed                      Exercises      General Comments General comments (skin integrity, edema, etc.): MD placed order for imminent discharge. Pt reports hopeful home tomorrrow. Discussed mobility issues he encounters at home and practiced entrance to his bathroom (which he states is the "trickiest" thing he does. He primarily  uses w/c when unable to don prosthesis      Pertinent Vitals/Pain Pain Assessment: No/denies pain    Home Living                      Prior Function            PT Goals (current goals can now be found in the care plan section) Acute Rehab PT Goals Patient Stated Goal: improve his strength Progress towards PT goals:  (goals met; if discharge delayed, will update goals)    Frequency  Min 3X/week    PT Plan Current plan remains appropriate    Co-evaluation              End of Session Equipment Utilized During Treatment: Gait belt Activity Tolerance: Patient tolerated treatment well Patient left: in chair;with call bell/phone within reach;with chair alarm set     Time: 3762-8315 PT Time Calculation (min) (ACUTE ONLY): 19 min  Charges:  $Gait Training: 8-22 mins                    G Codes:      SASSER,LYNN 08/16/15, 5:27 PM Pager [phone]

## 2015-07-19 NOTE — Progress Notes (Signed)
Family Medicine Teaching Service Daily Progress Note Intern Pager: [phone]  Patient name: [name] Medical record number: [id number] Date of birth: 1956/01/19 Age: 60 y.o. Gender: male  Primary Care Provider: Tawanna Sat, MD Consultants: none Code Status: Full   Pt Overview and Major Events to Date:  5/5: admitted for new onset HF  5/10: Heart cath  Assessment and Plan: COTY KACZMARCZYK is a 60 y.o. male admitted new onset CHF. PMH is significant for hypertension, schizophrenia, diabetes mellitus with peripheral neuropathy.    CHF:  New onse. 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 Diuresing well. JVD not appreciated on exam today. Yesterday had Uo net 3.02 L.  - Cr 1.16. - Appreciate cards recs; Awaiting recs regarding Life Vest /AICD - Likely switch to PO Lasix today, awaiting cards rec. - Anticipate d/c tomorrow (5/17): PT/OT for d/c c/s - Continue coreg 25 mg BID, spironolactone 12.5mg  daily, Hydralazine 25mg  TID, Lipitor 40 mg QD. ASA 81 mg daily, Lisinopril 5mg , Imdur 30mg .  Electrolytes: Goal Mg> 2; K >4 - Mg pending; will replete if needed - K 4.1   T2DM, uncontrolled: Last A1c 12.3 on 07/06/15.  -CBG 151 this am - Increase Lantus to 12 units qAM.   - Add Novolog 3 units TID WC based on pp sugars on 5/15. Keep regimen simple for greter adherence  -Continue to monitor CBGs  Pulmonary HTN: Patient endorses difficulty taking breath that is not 2/2 pain. Etiology: Left HF vs. Intrinsic lung disease (restrictive lung disease) vs. Sleep apnea -CXR did not show increased opacity indicative of restrictive lung disease, less likely but possible -Follow-up PFTs and sleep study as outpatient   HTN: Improved, 130-150s/80-90s; 115/75 today - Continue lisinopril 5 mg.  - Hydralazine 25mg  TID, Coreg, Aldactone.     Scrotal/penile swelling: Resolved.   Protein Calorie Malnutrition- Moderate -Albumin 2.6 -Appreciate RD recs -Ensure patient dietary education prior to d/c    Schizophrenia - stable off meds  Constipation. Resolved   Knee wounds and S/p R BKA. Patient crawls on his knees at home. Worsened by protein malnutrition. Seen by [facility] who discussed with Dr. Sharol Given. Not infected, and dressing with use of knee pads at home for now. F/U with Dr. Sharol Given as o/p planned.  - Amputation and previous surgical care with ortho.  - HH/Care Management at d/c to make sure he has appropriate help and ambulatory devices at home.  -Ortho techs c/s for compression sock for R leg -Crutches for home.   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 Mild.  - ACE-I as above  - Follow as outpatient. / Workup as indicated.   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 Pupil non-reactive and dilated. Followed by ophtho as an outpatient. - Continue home Brimodine, Cosopt, and visine drops in left eye.   FEN/GI: Heart health/carb modified, KVO  PPx: Sub Q heparin  Disposition: pending further diuresis.   Subjective:  Patient feeling "much better" well. No complaints. Breathing comfortably, swelling improved.   Objective: Temp:  [97.9 F (36.6 C)-98.2 F (36.8 C)] 97.9 F (36.6 C) (05/16 0554) Pulse Rate:  [67-72] 72 (05/16 0554) Resp:  [17-18] 18 (05/16 0554) BP: (111-140)/(64-87) 115/73 mmHg (05/16 0554) SpO2:  [93 %-97 %] 95 % (05/16 0554) Weight:  [84.188 kg (185 lb 9.6 oz)] 84.188 kg (185 lb 9.6 oz) (05/16 0554)  Physical Exam:   General: NAD, sitting  up on side of bed having breakfast Cardiovascular: RRR, Systolic murmur not appreciated today. No JVD.  Respiratory: Expiratory wheezes L>R, posteriorly and anterior.  Extremities: right BKA, 1+ pitting edema of L foot. Ulcerations with eschar of BL LE stable and dressing applied.   Intake/Output Summary (Last 24 hours) at 07/19/15 0721 Last data filed at 07/19/15 0332  Gross per 24 hour  Intake   1185 ml  Output    4201 ml  Net  -3016 ml     Laboratory:   Recent Labs Lab 07/17/15 0448 07/18/15 0337 07/19/15 0444  NA 140 138 138  K 3.8 3.7 4.1  CL 96* 95* 93*  CO2 33* 35* 33*  BUN 18 16 23*  CREATININE 1.10 1.20 1.16  CALCIUM 8.7* 8.8* 9.1  GLUCOSE 184* 217* 151*   CBG (last 3)   Recent Labs  07/18/15 1714 07/18/15 2103 07/19/15 0542  GLUCAP 305* 176* 151*    Imaging/Diagnostic Tests: None  [name], Med Student 07/19/2015, 7:21 AM MS4, Family Medicine Resident   I agree with the above evaluation, assessment, and plan. Any correctional changes can be noted in [facility].   Aquilla Hacker, MD Family Medicine Resident - PGY 2  S: Stable, and continues to improve.   ODanley Danker Vitals:   07/18/15 2131 07/19/15 0554  BP: 111/64 115/73  Pulse: 70 72  Temp: 98.2 F (36.8 C) 97.9 F (36.6 C)  Resp: 17 18  CV: RRR, No MGR, normal S1/S2 2+ distal pulses. JVD 3-5cm Resp: CTAB Abd: S, NT, ND G/U: Scrotal edema is gone Ext: WWP, Trace to no edema Skin: Ulcerations covered in bandages, no changes otherwise.   A/P;: 60 y/o with CHFrEF 15% NICM with resolved anasarca now near his dry weight.  - Switched to po lasix today - Monitor Cr - Home health orders placed - CBG's on the rise. Adjusted insulin.  - Home soon.

## 2015-07-19 NOTE — Progress Notes (Signed)
Nutrition Follow-up  DOCUMENTATION CODES:   Non-severe (moderate) malnutrition in context of chronic illness  INTERVENTION:  Provide Glucerna Shake once daily  Provide Multivitamin with minerals daily Recommend providing 500 mg of Vitamin C BID for 10 days Provide 30 ml of pro-stat daily, each dose provides 15 grams of protein and 100 kcal Provide Juven BID   NUTRITION DIAGNOSIS:   Increased nutrient needs related to wound healing as evidenced by estimated needs.  Ongoing  GOAL:   Patient will meet greater than or equal to 90% of their needs  Being met  MONITOR:   PO intake, Supplement acceptance, Labs, Weight trends, Skin, I & O's  REASON FOR ASSESSMENT:   Consult Assessment of nutrition requirement/status  ASSESSMENT:   60 year old male with a past medical history of HTN, DM. S/p right BKA. Presented to the ED with scrotal swelling and lower extremity edema.  Pt states that his appetite is good and he is eating 100% of most meals. Pt's weight has dropped significantly since admission, losing from 244 lbs to 185 lbs. Pt has lost 14% of his usual dry weight from 3 months ago and at 80% of pt's reported UBW of 230 lbs. Moderate muscle wasting is more obvious now that fluid/edema has resolved.   Low sodium and carb modified diet education done on 5/12. Pt denies any further questions at this time.   Labs: low chloride, glucose ranging 151 to 306 mg/dL  Diet Order:  Diet Heart Room service appropriate?: Yes; Fluid consistency:: Thin  Skin:  Wound (see comment) (unstageable pressure ulcers on right knee and left knee)  Last BM:  5/15  Height:   Ht Readings from Last 1 Encounters:  07/09/15 6' (1.829 m)    Weight:   Wt Readings from Last 1 Encounters:  07/19/15 185 lb 9.6 oz (84.188 kg)    Ideal Body Weight:  75.6 kg  BMI:  Body mass index is 25.17 kg/(m^2).  Estimated Nutritional Needs:   Kcal:  2200-2500  Protein:  120-130 grams  Fluid:  2.2-2.5  L/day  EDUCATION NEEDS:   No education needs identified at this time  [place], LDN Inpatient Clinical Dietitian Pager: [phone] After Hours Pager: [phone]

## 2015-07-19 NOTE — Progress Notes (Signed)
Patient Name: [name] Date of Encounter: 07/19/2015  Hospital Problem List     Principal Problem:   Acute systolic CHF (congestive heart failure) (HCC) Active Problems:   HYPERTENSION, BENIGN SYSTEMIC   AKI (acute kidney injury) ([place])   CHF (congestive heart failure) (HCC)   Dyspnea   Pressure ulcer   Elevated troponin   Scrotal swelling   Congestive dilated cardiomyopathy (HCC)   Protein calorie malnutrition (HCC)   Congestive heart failure (HCC)   Acute combined systolic and diastolic heart failure (HCC)    Subjective   No complaints. Feels well  Inpatient Medications    . aspirin EC  81 mg Oral Daily  . atorvastatin  40 mg Oral q1800  . brimonidine  1 drop Left Eye BID  . carvedilol  25 mg Oral BID WC  . dorzolamide-timolol  1 drop Left Eye BID  . feeding supplement (PRO-STAT SUGAR FREE 64)  30 mL Oral Daily  . furosemide  80 mg Intravenous BID  . heparin subcutaneous  5,000 Units Subcutaneous Q8H  . hydrALAZINE  25 mg Oral Q8H  . insulin aspart  0-9 Units Subcutaneous TID WC  . insulin glargine  10 Units Subcutaneous q morning - 10a  . isosorbide mononitrate  30 mg Oral Daily  . lisinopril  5 mg Oral Daily  . multivitamin with minerals  1 tablet Oral Daily  . nutrition supplement (JUVEN)  1 packet Oral BID WC  . polyethylene glycol  17 g Oral Daily  . sodium chloride flush  3 mL Intravenous Q12H  . sodium chloride flush  3 mL Intravenous Q12H  . sodium chloride flush  3 mL Intravenous Q12H  . spironolactone  25 mg Oral Daily  . tamsulosin  0.8 mg Oral QPC supper  . tetrahydrozoline  1 drop Left Eye BID    Vital Signs    Filed Vitals:   07/18/15 1112 07/18/15 1450 07/18/15 2131 07/19/15 0554  BP: 125/74 140/87 111/64 115/73  Pulse: 67  70 72  Temp: 98.2 F (36.8 C)  98.2 F (36.8 C) 97.9 F (36.6 C)  TempSrc: Oral  Oral Oral  Resp: 18  17 18   Height:      Weight:    185 lb 9.6 oz (84.188 kg)  SpO2: 93%  97% 95%    Intake/Output Summary  (Last 24 hours) at 07/19/15 0848 Last data filed at 07/19/15 0332  Gross per 24 hour  Intake   1185 ml  Output   4001 ml  Net  -2816 ml   Filed Weights   07/17/15 0500 07/18/15 0618 07/19/15 0554  Weight: 196 lb 8 oz (89.132 kg) 186 lb 14.4 oz (84.777 kg) 185 lb 9.6 oz (84.188 kg)    Physical Exam    General: Pleasant AA male, NAD. Neuro: Alert and oriented X 3. Moves all extremities spontaneously. Psych: Normal affect. HEENT:  Normal  Neck: Supple without bruits, minimal JVD. Lungs:  Resp regular and unlabored, CTA. Heart: RRR no s3, s4, or murmurs. Abdomen: Soft, non-tender, non-distended, BS + x 4.  Extremities: No clubbing, cyanosis, trace edema to left lower leg.Marland Kitchen DP/PT/Radials 2+. Right BKA  Labs    Basic Metabolic Panel  Recent Labs  07/17/15 0448 07/18/15 0337 07/19/15 0444  NA 140 138 138  K 3.8 3.7 4.1  CL 96* 95* 93*  CO2 33* 35* 33*  GLUCOSE 184* 217* 151*  BUN 18 16 23*  CREATININE 1.10 1.20 1.16  CALCIUM 8.7* 8.8* 9.1  MG  --  1.8  --   PHOS 3.3  --   --    Liver Function Tests  Recent Labs  07/17/15 0448  ALBUMIN 2.6*    Telemetry    SR Rate-70s, no PVCs or NSVT on telemetry  ECG    No recent EKG  Radiology      Assessment & Plan    60 yo male admitted 07/09/15 with acute heart failure and found to have severe LV dysfunction-15%. This occurs in the context of diabetes and hypertension prior lower extremity amputation He has undergone diuresis. Catheterization demonstrated no obstructive disease with markedly elevated filling pressures and ongoing diuresis was recommended  1. Acute Combined systolic and diastolic CHF: He is net -[id number] since admission and weight is down from 244>>185lbs. Cr remains stable with current dose of lasix at 80mg  IV BID. --Will change to PO lasix today --On BB, hydralazine, ACE, ASA, Imdur and diuretics  --Suggest changing carvedilol and hydralazine/Imdur to BiDil = which is hydralazine 37.5/isosorbide  dinitrate 20 mg.   2: HTN: Better controlled noted this morning with current medications.   3. Dilated cardiomyopathy/NICM: Patient has diffuse hypokinesis on echo with EF 15%.Suspect this is due to hypertensive CM since he stopped all BP meds a few months ago and diastolic BP was elevated in 100's on admission.Cath with minimal non obst CAD. Aggressive control during this admission. --Discussed with Dr. Ellyn Hack yesterday about the mention of a Life Vest in previous primary team notes, he advised that this would probably not be beneficial given this is nonischemic cardiomyopathy. Recommendations to continue with appropriate medical therapy.   4. DM: Management per primary team  5. Right BKA wound: Follow-up planned with Dr. Sharol Given as o/p  --Will make follow up appt with HF clinic.   Signed, Reino Bellis NP-C Pager [phone]

## 2015-07-20 ENCOUNTER — Ambulatory Visit (HOSPITAL_COMMUNITY): Payer: Medicaid Other

## 2015-07-20 DIAGNOSIS — E440 Moderate protein-calorie malnutrition: Secondary | ICD-10-CM | POA: Insufficient documentation

## 2015-07-20 LAB — GLUCOSE, CAPILLARY
Glucose-Capillary: 247 mg/dL — ABNORMAL HIGH (ref 65–99)
Glucose-Capillary: 183 mg/dL — ABNORMAL HIGH (ref 65–99)

## 2015-07-20 LAB — BASIC METABOLIC PANEL
Anion gap: 11 (ref 5–15)
BUN: 31 mg/dL — ABNORMAL HIGH (ref 6–20)
CO2: 35 mmol/L — ABNORMAL HIGH (ref 22–32)
Calcium: 9.4 mg/dL (ref 8.9–10.3)
Chloride: 93 mmol/L — ABNORMAL LOW (ref 101–111)
Creatinine, Ser: 1.44 mg/dL — ABNORMAL HIGH (ref 0.61–1.24)
GFR calc Af Amer: >60 mL/min (ref >60)
GFR calc non Af Amer: 52 mL/min — ABNORMAL LOW (ref >60)
Glucose, Bld: 212 mg/dL — ABNORMAL HIGH (ref 65–99)
Potassium: 4.9 mmol/L (ref 3.5–5.1)
Sodium: 139 mmol/L (ref 135–145)

## 2015-07-20 MED ORDER — FUROSEMIDE 40 MG PO TABS: 40.0000 mg | ORAL_TABLET | Freq: Every day | ORAL | Status: DC

## 2015-07-20 MED ORDER — INSULIN GLARGINE 100 UNIT/ML ~~LOC~~ SOLN
10.0000 [IU] | Freq: Every morning | SUBCUTANEOUS | Status: DC
  Administered 2015-07-20: 10 [IU] via SUBCUTANEOUS
  Filled 2015-07-20: qty 0.1

## 2015-07-20 MED ORDER — SPIRONOLACTONE 25 MG PO TABS: 25.0000 mg | ORAL_TABLET | Freq: Every day | ORAL | Status: DC

## 2015-07-20 MED ORDER — ISOSORB DINITRATE-HYDRALAZINE 20-37.5 MG PO TABS: 1.0000 | ORAL_TABLET | Freq: Three times a day (TID) | ORAL | Status: DC

## 2015-07-20 MED ORDER — LIVING WELL WITH DIABETES BOOK
Freq: Once | Status: AC
  Administered 2015-07-20: 16:00:00
  Filled 2015-07-20: qty 1

## 2015-07-20 MED ORDER — ATORVASTATIN CALCIUM 40 MG PO TABS: 40.0000 mg | ORAL_TABLET | Freq: Every day | ORAL | Status: DC

## 2015-07-20 MED ORDER — ASPIRIN 81 MG PO TBEC: 81.0000 mg | DELAYED_RELEASE_TABLET | Freq: Every day | ORAL | Status: DC

## 2015-07-20 MED ORDER — CARVEDILOL 25 MG PO TABS: 25.0000 mg | ORAL_TABLET | Freq: Two times a day (BID) | ORAL | Status: DC

## 2015-07-20 MED ORDER — TAMSULOSIN HCL 0.4 MG PO CAPS: 0.8000 mg | ORAL_CAPSULE | Freq: Every day | ORAL | Status: DC

## 2015-07-20 MED ORDER — LISINOPRIL 5 MG PO TABS: 5.0000 mg | ORAL_TABLET | Freq: Every day | ORAL | Status: DC

## 2015-07-20 NOTE — Discharge Instructions (Signed)
We are glad that you are feeling better.   It is VERY important that you avoid salt intake whenever you go home. See the diet instructions below.   You were started on new medicines you must take these EVERY day as prescribed.   Coreg - take twice a day Lisinopril - take once a day BiDil - Take three times a day Aspirin - Take one per day Lipitor - Take one each day Spironolactone - Take one each day  Lasix - Take 40mg  once a day. If you gain more than 3 pounds in one day or 5 pounds in one week, then take an extra dose of Lasix and make an appointment to see your doctor.   If you gain 3 pounds in one day or 5 pounds in one week, then call our office for an appointment.   Please get a scale so that you can weigh yourself at home each day. Write the weights down and bring them to your appointments.   Additionally, you will take 10 units of Lantus in the morning.   You will also take 3 units of Novolog with each meal during the day.   You have a follow up  Appointment noted on your discharge paperwork.   See the diet instructions below for your diet recommendations in heart failure.   Low-Sodium Eating Plan Sodium raises blood pressure and causes water to be held in the body. Getting less sodium from food will help lower your blood pressure, reduce any swelling, and protect your heart, liver, and kidneys. We get sodium by adding salt (sodium chloride) to food. Most of our sodium comes from canned, boxed, and frozen foods. Restaurant foods, fast foods, and pizza are also very high in sodium. Even if you take medicine to lower your blood pressure or to reduce fluid in your body, getting less sodium from your food is important. WHAT IS MY PLAN? Most people should limit their sodium intake to 2,300 mg a day. Your health care provider recommends that you limit your sodium intake to 2000mg  a day.  WHAT DO I NEED TO KNOW ABOUT THIS EATING PLAN? For the low-sodium eating plan, you will follow  these general guidelines:  Choose foods with a % Daily Value for sodium of less than 5% (as listed on the food label).   Use salt-free seasonings or herbs instead of table salt or sea salt.   Check with your health care provider or pharmacist before using salt substitutes.   Eat fresh foods.  Eat more vegetables and fruits.  Limit canned vegetables. If you do use them, rinse them well to decrease the sodium.   Limit cheese to 1 oz (28 g) per day.   Eat lower-sodium products, often labeled as "lower sodium" or "no salt added."  Avoid foods that contain monosodium glutamate (MSG). MSG is sometimes added to [place] food and some canned foods.  Check food labels (Nutrition Facts labels) on foods to learn how much sodium is in one serving.  Eat more home-cooked food and less restaurant, buffet, and fast food.  When eating at a restaurant, ask that your food be prepared with less salt, or no salt if possible.  HOW DO I READ FOOD LABELS FOR SODIUM INFORMATION? The Nutrition Facts label lists the amount of sodium in one serving of the food. If you eat more than one serving, you must multiply the listed amount of sodium by the number of servings. Food labels may also identify foods as:  Sodium free--Less than 5 mg in a serving.  Very low sodium--35 mg or less in a serving.  Low sodium--140 mg or less in a serving.  Light in sodium--50% less sodium in a serving. For example, if a food that usually has 300 mg of sodium is changed to become light in sodium, it will have 150 mg of sodium.  Reduced sodium--25% less sodium in a serving. For example, if a food that usually has 400 mg of sodium is changed to reduced sodium, it will have 300 mg of sodium. WHAT FOODS CAN I EAT? Grains Low-sodium cereals, including oats, puffed wheat and rice, and shredded wheat cereals. Low-sodium crackers. Unsalted rice and pasta. Lower-sodium bread.  Vegetables Frozen or fresh vegetables.  Low-sodium or reduced-sodium canned vegetables. Low-sodium or reduced-sodium tomato sauce and paste. Low-sodium or reduced-sodium tomato and vegetable juices.  Fruits Fresh, frozen, and canned fruit. Fruit juice.  Meat and Other Protein Products Low-sodium canned tuna and salmon. Fresh or frozen meat, poultry, seafood, and fish. Lamb. Unsalted nuts. Dried beans, peas, and lentils without added salt. Unsalted canned beans. Homemade soups without salt. Eggs.  Dairy Milk. Soy milk. Ricotta cheese. Low-sodium or reduced-sodium cheeses. Yogurt.  Condiments Fresh and dried herbs and spices. Salt-free seasonings. Onion and garlic powders. Low-sodium varieties of mustard and ketchup. Fresh or refrigerated horseradish. Lemon juice.  Fats and Oils Reduced-sodium salad dressings. Unsalted butter.  Other Unsalted popcorn and pretzels.  The items listed above may not be a complete list of recommended foods or beverages. Contact your dietitian for more options. WHAT FOODS ARE NOT RECOMMENDED? Grains Instant hot cereals. Bread stuffing, pancake, and biscuit mixes. Croutons. Seasoned rice or pasta mixes. Noodle soup cups. Boxed or frozen macaroni and cheese. Self-rising flour. Regular salted crackers. Vegetables Regular canned vegetables. Regular canned tomato sauce and paste. Regular tomato and vegetable juices. Frozen vegetables in sauces. Salted [place] fries. Olives. Angie Fava. Relishes. Sauerkraut. Salsa. Meat and Other Protein Products Salted, canned, smoked, spiced, or pickled meats, seafood, or fish. Bacon, ham, sausage, hot dogs, corned beef, chipped beef, and packaged luncheon meats. Salt pork. Jerky. Pickled herring. Anchovies, regular canned tuna, and sardines. Salted nuts. Dairy Processed cheese and cheese spreads. Cheese curds. Blue cheese and cottage cheese. Buttermilk.  Condiments Onion and garlic salt, seasoned salt, table salt, and sea salt. Canned and packaged gravies.  Worcestershire sauce. Tartar sauce. Barbecue sauce. Teriyaki sauce. Soy sauce, including reduced sodium. Steak sauce. Fish sauce. Oyster sauce. Cocktail sauce. Horseradish that you find on the shelf. Regular ketchup and mustard. Meat flavorings and tenderizers. Bouillon cubes. Hot sauce. Tabasco sauce. Marinades. Taco seasonings. Relishes. Fats and Oils Regular salad dressings. Salted butter. Margarine. Ghee. Bacon fat.  Other Potato and tortilla chips. Corn chips and puffs. Salted popcorn and pretzels. Canned or dried soups. Pizza. Frozen entrees and pot pies.  The items listed above may not be a complete list of foods and beverages to avoid. Contact your dietitian for more information.   This information is not intended to replace advice given to you by your health care provider. Make sure you discuss any questions you have with your health care provider.   Document Released: 08/11/2001 Document Revised: 03/12/2014 Document Reviewed: 12/24/2012 Elsevier Interactive Patient Education [employer].

## 2015-07-20 NOTE — Progress Notes (Signed)
Occupational Therapy Treatment Patient Details Name: [name] MRN: [id number] DOB: [birth year] Today's Date: 07/20/2015    History of present illness Patient is a 60 yo male admitted 07/08/15 with swelling - new onset CHF (including scrotal edema and inability to don prosthesis); wounds bil knees from walking on his knees; 5/10 Rt and Lt heart catheterizaiton via Rt radial artery  PMH:  HTN, schizophrenia, DM, peripheral neuropathy, Rt BKA, blind Lt eye   OT comments  Patient progressing well towards OT goals. Reviewed energy conservation techniques with patient. Patient reports he is being discharged home today.   Follow Up Recommendations  Home health OT;Supervision - Intermittent    Equipment Recommendations  None recommended by OT    Recommendations for Other Services      Precautions / Restrictions Precautions Precautions: Fall Precaution Comments: Has Rt prosthesis that doesn't fit. States he is going to see Dr. Sharol Given and [employer] [facility] at appt in 2 weeks       Mobility Bed Mobility                  Transfers                      Balance                                   ADL                                         General ADL Comments: Patient reports he is going home today. Reviewed energy conservation techniques with patient and he verbalized understanding. Patient plans to use w/c as primary means of mobility except when he goes to bathroom where he will have to use RW since w/c does not fit into bathroom. Patient feels comfortable with this plan.      Vision                     Perception     Praxis      Cognition   Behavior During Therapy: WFL for tasks assessed/performed Overall Cognitive Status: Within Functional Limits for tasks assessed                       Extremity/Trunk Assessment               Exercises     Shoulder Instructions       General Comments       Pertinent Vitals/ Pain       Pain Assessment: No/denies pain  Home Living                                          Prior Functioning/Environment              Frequency Min 2X/week     Progress Toward Goals  OT Goals(current goals can now be found in the care plan section)  Progress towards OT goals: Progressing toward goals  Acute Rehab OT Goals Patient Stated Goal: improve his strength  Plan Discharge plan remains appropriate    Co-evaluation                 End  of Session     Activity Tolerance Patient tolerated treatment well   Patient Left in bed;with call bell/phone within reach   Nurse Communication          Time: 1300-1311 OT Time Calculation (min): 11 min  Charges: OT General Charges $OT Visit: 1 Procedure OT Treatments $Self Care/Home Management : 8-22 mins  Early, Leila A 07/20/2015, 1:16 PM

## 2015-07-20 NOTE — Progress Notes (Signed)
Family Medicine Teaching Service Daily Progress Note Intern Pager: [phone]  Patient name: [name] Medical record number: [id number] Date of birth: February 26, 1956 Age: 60 y.o. Gender: male  Primary Care Provider: Tawanna Sat, MD Consultants: Cards, PT/OT Code Status: Full   Pt Overview and Major Events to Date:  5/5: admitted for new onset HF  5/10: Heart cath  Assessment and Plan: [name] is a 60 y.o. male admitted new onset CHF. PMH is significant for hypertension, schizophrenia, diabetes mellitus with peripheral neuropathy.    CHF:  New onse. CHFrEF 15% NICM with resolved anasarca now near his dry weight. Euvolemic. Yesterday had Uo net 1.6 L. Overall weight loss of 58.8 lbs. - Anticipate d/c today - Bump in Cr 1.44. - Appreciate cards recs:   - Change Carvedilol and hydralazine/Imdur to BiDil=hydralazine 37.5/isosorbide dinitrate 20 mg  - Per cards, does not need LifeVest. F/u HF clinic as O/P - Decrease PO Lasix to 40 mg BID w. close O/P BMT f/u - Continue spironolactone 12.5mg  daily, Lipitor 40 mg QD. ASA 81 mg daily, Lisinopril 5mg   Electrolytes: Goal Mg> 2; K >4 - Mg pending; will replete if needed - K 4.9   T2DM, uncontrolled: A1c 12.3 on 07/06/15. Goal is no hypoglycemia for increased med adherence.  - CBG 247 this am - Lantus 10 units qAM.   - Novolog 3 units TID WC. Keep regimen simple for greter adherence  - Add metformin as O/P once Cr is stable  Pulmonary HTN: Patient endorses difficulty taking breath that is not 2/2 pain. Left HF vs. Intrinsic lung disease (restrictive lung disease) vs. Sleep apnea -CXR did not show increased opacity indicative of restrictive lung disease, less likely but possible -Follow-up PFTs and sleep study as outpatient   HTN: Improved, 130-150s/80-90s; 148/80 today - Continue lisinopril 5 mg, Aldactone.  - Switch to BiDil as above  Protein Calorie Malnutrition- Moderate -Albumin 2.6 -Appreciate RD recs -Ensure patient  dietary education prior to d/c   Schizophrenia - stable off meds  Knee wounds and S/p R BKA. Crawls on his knees at home. Worsened by protein malnutrition. Seen by [name] who discussed with [name]. Not infected, and dressing with use of knee pads at home for now. F/U with [name] as o/p planned.  -Amputation and previous surgical care with ortho.  -OK to discharge home from a PT perspective (5/16).  -Home health PT (may not qualify due to Medicaid; home safety evaluation recommended) -Supervision: Intermittent (family checks on him as PTA)      -Ortho techs c/s for compression sock for R leg -Crutches for home.   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 Mild.  - ACE-I as above  - Follow as outpatient. / Workup as indicated.   Potential BPH: no evidence of urinary retention  - continue Flomax  Constipation. 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 Pupil non-reactive and dilated. Followed by ophtho as an outpatient. - Continue home Brimodine, Cosopt, and visine drops in left eye.   FEN/GI: Heart health/carb modified, KVO  PPx: Sub Q heparin  Disposition: home. Home safety evaluation recommended   Subjective:  Feels well. Ready to go home. No complaints. Breathing comfortably, swelling resolved.   Objective: Temp:  [97.9 F (36.6 C)-98.6 F (37 C)] 97.9 F (36.6 C) (05/17 [id number]) Pulse Rate:  [69-75] 75 (05/17 0633) Resp:  [17-18] 17 (05/17 0633) BP: (108-148)/(66-80) 148/80 mmHg (05/17 0633) SpO2:  [92 %-98 %]  92 % (05/17 [id number])  Physical Exam:   General: NAD, sitting up on bed  Cardiovascular: RRR, no S3 or S4. Systolic murmur not appreciated today. No JVD.  Respiratory: No wheezes,  CTA, clear BS bilaterally Extremities: right BKA, no edema on L foot or right leg. Ulcerations with eschar of BL LE stable and dressing applied.   Intake/Output Summary (Last 24 hours) at 07/20/15 0717 Last data filed at 07/20/15 [id number]   Gross per 24 hour  Intake    600 ml  Output   2220 ml  Net  -1620 ml     Laboratory:   Recent Labs Lab 07/18/15 0337 07/19/15 0444 07/20/15 0454  NA 138 138 139  K 3.7 4.1 4.9  CL 95* 93* 93*  CO2 35* 33* 35*  BUN 16 23* 31*  CREATININE 1.20 1.16 1.44*  CALCIUM 8.8* 9.1 9.4  GLUCOSE 217* 151* 212*   CBG (last 3)   Recent Labs  07/19/15 1737 07/19/15 2125 07/20/15 0630  GLUCAP 237* 138* 247*    Imaging/Diagnostic Tests: None  [name], Med Student 07/20/2015, 7:17 AM MS4, Family Medicine Resident

## 2015-07-20 NOTE — Progress Notes (Signed)
Patient discharging home today; [place] arranged as requested ( see CM note 07/15/2015) Stanton Kidney with [facility] made aware of discharge; Aneta Mins [phone]

## 2015-07-20 NOTE — Progress Notes (Signed)
Patient is discharge to home accompanied by patient's sister and NT via wheelchair. Discharge instructions given . Patient verbalizes understanding. All personal belongings given..Living well diabetes book given. Patient watch diabetes video. Telemetry box and IV removed prior to discharge and site in good condition.

## 2015-07-20 NOTE — Progress Notes (Signed)
     Reviewed patient chart this morning, no new findings noted. Continue with current recommendations. Talked with patient who reports he feels well and is ready to go home today. F/u appt has been made with the Heart Failure Clinic. Needs repeat Echo in 3-4 months to reassess EF at that time.   Reino Bellis NP-C Pager [phone]  Pt seen briefly = looks stable from cardiac standpoint.   Would d/c on current regimen - but would allow for PRN dosing of 40 mg Lasix for edema, wgt gain > 3Lb in 1d or 5 Lb in 5 days.  OP f/u arranged.  Will sign off. Call for further ?s.   Glenetta Hew, MD Glenetta Hew, M.D., M.S. Interventional Cardiologist   Pager # [phone] Phone # [phone] [address] [place], [place] [address]

## 2015-07-29 ENCOUNTER — Encounter: Payer: Self-pay | Admitting: Family Medicine

## 2015-07-29 ENCOUNTER — Ambulatory Visit (INDEPENDENT_AMBULATORY_CARE_PROVIDER_SITE_OTHER): Payer: Medicaid Other | Admitting: Family Medicine

## 2015-07-29 VITALS — BP 168/111 | HR 68 | Temp 98.2°F

## 2015-07-29 DIAGNOSIS — S81009A Unspecified open wound, unspecified knee, initial encounter: Secondary | ICD-10-CM

## 2015-07-29 DIAGNOSIS — E1142 Type 2 diabetes mellitus with diabetic polyneuropathy: Secondary | ICD-10-CM | POA: Diagnosis not present

## 2015-07-29 DIAGNOSIS — I10 Essential (primary) hypertension: Secondary | ICD-10-CM

## 2015-07-29 DIAGNOSIS — G4733 Obstructive sleep apnea (adult) (pediatric): Secondary | ICD-10-CM

## 2015-07-29 DIAGNOSIS — Z794 Long term (current) use of insulin: Secondary | ICD-10-CM

## 2015-07-29 DIAGNOSIS — E1165 Type 2 diabetes mellitus with hyperglycemia: Secondary | ICD-10-CM | POA: Diagnosis not present

## 2015-07-29 DIAGNOSIS — IMO0002 Reserved for concepts with insufficient information to code with codable children: Secondary | ICD-10-CM

## 2015-07-29 DIAGNOSIS — I5041 Acute combined systolic (congestive) and diastolic (congestive) heart failure: Secondary | ICD-10-CM

## 2015-07-29 NOTE — Assessment & Plan Note (Signed)
He is above goal today in clinic but also only taking one of his BP medicines. Discussed importance of controlling BP to prevent worsening of cardiac function, kidney damage.

## 2015-07-29 NOTE — Assessment & Plan Note (Signed)
Reporting elevated CBGs; admits to eating a lot of fruit though also seems to have cut down on other sweets. Stressed importance of dietary adherence. He is also not taking the lantus, which he was able to tell me the difference between the novolog and lantus, again discussed lantus importance.

## 2015-07-29 NOTE — Progress Notes (Signed)
Subjective:    Patient ID: [name], male    DOB: 12-Jul-1955, 60 y.o.   MRN: [id number]  HPI  CC: follow up hospital  "DC summary recommendations 1. 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  2. Group II pulm HTN: f/u on Echo in 3-5 months, PFTs & sleep study if indicated 3. T2DM- A1c of 12.3 > f/u home CBG's and Insulin compliance.  4. R BKA w/ wound - F/U with Dr. Sharol Given"  # New onset HFrEF with acute exacerbation:  Has not been taking all of his medicines. Hasn't been taking his BP medicines, only taking the coreg -- says the reason is he doesn't want to be taking 3 blood pressure medicines. Took coreg this morning.   Voiding well with the lasix  Says his breathing is "alright" - doesn't specifically say anything is wrong when asked, no shortness of breath, no coughing  No significant leg swelling, no scrotal swelling  # Diabetes  CBGs have been "up" in the 300s -- says this is because of what he is eating, watermelon.  Says he is eating a lot of fruits and vegetables. Not eating cookies, but is eating 3-4 pieces of hard candy a day.   Not taking the lantus, but still using the novolog 5 units 3 times a day. He is not sure why he isn't taking the lantus.   # R BKA wound  Says he is no longer crawling around, using wheelchair  Wound ulcer is improving  Has not yet called back home health  Social Hx: former smoker  Review of Systems   See HPI for ROS.   Past medical history, surgical, family, and social history reviewed and updated in the EMR as appropriate. Objective:  BP 168/111 mmHg  Pulse 68  Temp(Src) 98.2 F (36.8 C) (Oral)  SpO2 98% Vitals and nursing note reviewed  General: no apparent distress, in wheel chair CV: normal rate, regular rhythm, no murmur appreciated Resp: mild crackles at right lung base, normal effort, normal rate MSK: ulcer right anterior knee appears to be improving, no necrosis or drainage. Ulcer on left knee  also improving. Neuro: DM foot exam done on left foot, decreased sensation to monofilament all spots.  Assessment & Plan:  Acute combined systolic and diastolic heart failure (HCC) Recent diagnosis earlier this month. Reporting improved symptoms and no significant swelling. He seems to be compliant with his lasix which he reports providing brisk voiding at 40mg  daily. However, he admits to not taking his spironolactone, lisinopril, bidil; currently only taking coreg. I discussed with him the importance of taking these medicines and stressed their importance for helping to prevent exacerbations. He has follow up with HF clinic/Dr. Bensimhon next week.  DM (diabetes mellitus), type 2, uncontrolled ([place]) Reporting elevated CBGs; admits to eating a lot of fruit though also seems to have cut down on other sweets. Stressed importance of dietary adherence. He is also not taking the lantus, which he was able to tell me the difference between the novolog and lantus, again discussed lantus importance.   Open knee wound Improving. He reports staying off his knees to get around the house. I asked him to call back home health agency to get services started. Keep wounds clean and follow up if developing any changes or enlarging.  Essential hypertension, benign He is above goal today in clinic but also only taking one of his BP medicines. Discussed importance of controlling BP to prevent worsening of cardiac function,  kidney damage.  Possible OSA Referral to sleep study. He has several risk factors and with significant CHF needs to be evaluated and treated.  Return in about 1 month (around 08/29/2015).

## 2015-07-29 NOTE — Patient Instructions (Addendum)
Blood pressure and heart failure:  START taking all of the medicines as they were prescribed:  Coreg 25mg  twice a day Lisinopril 5mg  once a day Spironolactone 25mg  once a day Bidil take 3 times a day Lasix 40mg  once a day --- if you are not peeing well after taking this you need to call the clinic  You have your cardiology appointment on 6/1, next Thursday  Go to your [facility] for your Diabetic eye exam -- call them to see if this is due.   Make sure you are taking the lantus every day unless your blood sugar is very low (less than 90)  Keep the knee wounds clean and dry, don't crawl around on your knees.

## 2015-07-29 NOTE — Assessment & Plan Note (Signed)
Recent diagnosis earlier this month. Reporting improved symptoms and no significant swelling. He seems to be compliant with his lasix which he reports providing brisk voiding at 40mg  daily. However, he admits to not taking his spironolactone, lisinopril, bidil; currently only taking coreg. I discussed with him the importance of taking these medicines and stressed their importance for helping to prevent exacerbations. He has follow up with HF clinic/Dr. Bensimhon next week.

## 2015-07-29 NOTE — Assessment & Plan Note (Signed)
Improving. He reports staying off his knees to get around the house. I asked him to call back home health agency to get services started. Keep wounds clean and follow up if developing any changes or enlarging.

## 2015-08-04 ENCOUNTER — Inpatient Hospital Stay (HOSPITAL_COMMUNITY): Payer: Medicaid Other | Admitting: Internal Medicine

## 2015-08-19 ENCOUNTER — Other Ambulatory Visit (HOSPITAL_COMMUNITY): Payer: Medicaid Other

## 2015-08-25 ENCOUNTER — Encounter: Payer: Self-pay | Admitting: Family Medicine

## 2015-08-25 ENCOUNTER — Ambulatory Visit (INDEPENDENT_AMBULATORY_CARE_PROVIDER_SITE_OTHER): Payer: Medicaid Other | Admitting: Family Medicine

## 2015-08-25 VITALS — BP 142/99 | HR 80 | Temp 97.9°F | Ht 72.0 in | Wt 206.0 lb

## 2015-08-25 DIAGNOSIS — S81009A Unspecified open wound, unspecified knee, initial encounter: Secondary | ICD-10-CM

## 2015-08-25 DIAGNOSIS — I5042 Chronic combined systolic (congestive) and diastolic (congestive) heart failure: Secondary | ICD-10-CM | POA: Diagnosis present

## 2015-08-25 MED ORDER — SPIRONOLACTONE 25 MG PO TABS: 25.0000 mg | ORAL_TABLET | Freq: Every day | ORAL | Status: DC

## 2015-08-25 MED ORDER — CARVEDILOL 12.5 MG PO TABS: 12.5000 mg | ORAL_TABLET | Freq: Two times a day (BID) | ORAL | Status: DC

## 2015-08-25 MED ORDER — LISINOPRIL 5 MG PO TABS: 5.0000 mg | ORAL_TABLET | Freq: Every day | ORAL | Status: DC

## 2015-08-25 MED ORDER — ISOSORB DINITRATE-HYDRALAZINE 20-37.5 MG PO TABS: 1.0000 | ORAL_TABLET | Freq: Three times a day (TID) | ORAL | Status: DC

## 2015-08-25 MED ORDER — FUROSEMIDE 40 MG PO TABS
40.0000 mg | ORAL_TABLET | Freq: Every day | ORAL | Status: DC
Start: 2015-08-25 — End: 2015-09-01

## 2015-08-25 MED ORDER — ATORVASTATIN CALCIUM 40 MG PO TABS: 40.0000 mg | ORAL_TABLET | Freq: Every day | ORAL | Status: DC

## 2015-08-25 NOTE — Patient Instructions (Addendum)
If the Coreg (carvedilol) tablets are scored/have a notch in them you can break them in half and take one half tablet twice a day. If they are not, you can pickup the new prescription. Take this for the next week and talk to Dr. Haroldine Laws about it.  [facility] for your diabetes eye exam! Call them to schedule an appointment  Address: [address] [place], [place], [place] [address]  Phone: [phone]

## 2015-08-25 NOTE — Progress Notes (Signed)
   Subjective:    Patient ID: [name], male    DOB: 01-29-1956, 60 y.o.   MRN: [id number]  HPI  CC:   # CHF:  Was called and told that he did not need to go in to see advanced heart failure clinic  Not taking coreg, taking lasix, bidil, aldactone  Denies any pains, changing in weight, edema ROS: no current SOB, no CP, no leg swelling  # Knee wounds  Says they are improving  Not walking on knees  Not painful, not bleeding, not draining ROS: no fevers or chills  Social Hx: former smoker  Review of Systems   See HPI for ROS.   Past medical history, surgical, family, and social history reviewed and updated in the EMR as appropriate. Objective:  BP 142/99 mmHg  Pulse 80  Temp(Src) 97.9 F (36.6 C) (Oral)  Ht 6' (1.829 m)  Wt 206 lb (93.441 kg)  BMI 27.93 kg/m2 Vitals and nursing note reviewed  General: no apparent distress  CV: normal rate, regular rhythm, no murmur Resp: clear to auscultation bilaterally, normal effort Extremities: right BKA, no edema bilaterally  Skin: healing superficial ulcers over both knees, not bleeding, no fluctuance, no tenderness  Assessment & Plan:  Chronic combined systolic and diastolic congestive heart failure (HCC) Currently stable. Has upcoming cardiology appointment. Not taking the coreg. Discussed decreasing coreg dose in half and discuss with cardiology.  Open knee wound Improving. Instructed to keep clean but does not appear to be needing additional wound care right now.    Return in about 2 months (around 10/25/2015). '

## 2015-08-25 NOTE — Progress Notes (Signed)
Patient left before BP could be recheck.  [place] per PCP. Jazmin Hartsell,CMA

## 2015-08-31 NOTE — Assessment & Plan Note (Signed)
Improving. Instructed to keep clean but does not appear to be needing additional wound care right now.

## 2015-08-31 NOTE — Assessment & Plan Note (Signed)
Currently stable. Has upcoming cardiology appointment. Not taking the coreg. Discussed decreasing coreg dose in half and discuss with cardiology.

## 2015-09-01 ENCOUNTER — Ambulatory Visit (HOSPITAL_COMMUNITY)
Admission: RE | Admit: 2015-09-01 | Discharge: 2015-09-01 | Disposition: A | Discharge status: Home / Self Care | Payer: Medicaid Other | Source: Ambulatory Visit | Attending: Internal Medicine | Admitting: Internal Medicine

## 2015-09-01 ENCOUNTER — Encounter (HOSPITAL_COMMUNITY): Payer: Self-pay | Admitting: Internal Medicine

## 2015-09-01 VITALS — BP 160/94 | HR 88 | Resp 18 | Wt 186.8 lb

## 2015-09-01 DIAGNOSIS — I10 Essential (primary) hypertension: Secondary | ICD-10-CM | POA: Diagnosis not present

## 2015-09-01 DIAGNOSIS — I420 Dilated cardiomyopathy: Secondary | ICD-10-CM | POA: Insufficient documentation

## 2015-09-01 DIAGNOSIS — Z794 Long term (current) use of insulin: Secondary | ICD-10-CM | POA: Insufficient documentation

## 2015-09-01 DIAGNOSIS — E1122 Type 2 diabetes mellitus with diabetic chronic kidney disease: Secondary | ICD-10-CM | POA: Diagnosis not present

## 2015-09-01 DIAGNOSIS — Z87891 Personal history of nicotine dependence: Secondary | ICD-10-CM | POA: Insufficient documentation

## 2015-09-01 DIAGNOSIS — N183 Chronic kidney disease, stage 3 (moderate): Secondary | ICD-10-CM | POA: Diagnosis not present

## 2015-09-01 DIAGNOSIS — I130 Hypertensive heart and chronic kidney disease with heart failure and stage 1 through stage 4 chronic kidney disease, or unspecified chronic kidney disease: Secondary | ICD-10-CM | POA: Diagnosis not present

## 2015-09-01 DIAGNOSIS — Z89511 Acquired absence of right leg below knee: Secondary | ICD-10-CM | POA: Insufficient documentation

## 2015-09-01 DIAGNOSIS — I5022 Chronic systolic (congestive) heart failure: Secondary | ICD-10-CM | POA: Insufficient documentation

## 2015-09-01 DIAGNOSIS — I5042 Chronic combined systolic (congestive) and diastolic (congestive) heart failure: Secondary | ICD-10-CM

## 2015-09-01 DIAGNOSIS — Z7982 Long term (current) use of aspirin: Secondary | ICD-10-CM | POA: Insufficient documentation

## 2015-09-01 LAB — BASIC METABOLIC PANEL
Anion gap: 7 (ref 5–15)
BUN: 10 mg/dL (ref 6–20)
CO2: 29 mmol/L (ref 22–32)
Calcium: 9.4 mg/dL (ref 8.9–10.3)
Chloride: 101 mmol/L (ref 101–111)
Creatinine, Ser: 0.97 mg/dL (ref 0.61–1.24)
GFR calc Af Amer: >60 mL/min (ref >60)
GFR calc non Af Amer: >60 mL/min (ref >60)
Glucose, Bld: 244 mg/dL — ABNORMAL HIGH (ref 65–99)
Potassium: 3.6 mmol/L (ref 3.5–5.1)
Sodium: 137 mmol/L (ref 135–145)

## 2015-09-01 MED ORDER — FUROSEMIDE 40 MG PO TABS: 40.0000 mg | ORAL_TABLET | Freq: Two times a day (BID) | ORAL | Status: DC

## 2015-09-01 MED ORDER — ISOSORB DINITRATE-HYDRALAZINE 20-37.5 MG PO TABS: 2.0000 | ORAL_TABLET | Freq: Three times a day (TID) | ORAL | Status: DC

## 2015-09-01 NOTE — Progress Notes (Signed)
Patient ID: [name], male   DOB: 06-10-1955, 60 y.o.   MRN: [id number]   ADVANCED HF CLINIC CONSULT NOTE   HPI:  Mr. Castiglioni is a 60 year old male with a past medical history of HTN, DM, :PAD S/p right BKA with non-haling wound, admitted for acute HF in 5/17// Echo on 07/10/15 showed EF of 15%, diffuse hypokinesis, PA pressure elevated at 56mmHg.   Was hospitalized 5/5 - 07/20/15  Here for post hospital f/u. Diuresed 42 pounds 244->202  Here for post hospital f/u: Now back home. Lives alone but gets around with wheelchair and walker. His sister helps as needed. Not wearing prosthesis given LE wound. Unable to weigh. Legs beginning to swell again. No  SOB. Occasional orthopnea. No PND. Sugars still high.   Cath 07/13/15   Cardiac Cath Findings:  Ao = 135/87 (107) LV = 141/20/30 RA = 17 RV = 67/11/20 PA = 62/29 (41) PCW = 35 (v wave 45-50) Fick cardiac output/index = 6.0/2.7 PVR = 1.0 WU SVR = 1198  FA sat = 88% PA sat = 58%, 57%  Labs (5/17): K 4.9 Cr 1.44     Past Medical History  Diagnosis Date  . Diabetes mellitus   . Hypertension   . Congestive dilated cardiomyopathy ([place]) 07/11/2015    Current Outpatient Prescriptions  Medication Sig Dispense Refill  . ACCU-CHEK AVIVA PLUS test strip CHECK SUGAR 3 TIMES DAILY 100 each 11  . acetaminophen (TYLENOL) 500 MG tablet Take 1,000 mg by mouth every 6 (six) hours as needed for moderate pain.    Marland Kitchen aspirin EC 81 MG EC tablet Take 1 tablet (81 mg total) by mouth daily. 30 tablet 1  . atorvastatin (LIPITOR) 40 MG tablet Take 1 tablet (40 mg total) by mouth daily. 90 tablet 1  . brimonidine (ALPHAGAN) 0.2 % ophthalmic solution PLACE 1 DROP INTO THE LEFT EYE 2 (TWO) TIMES DAILY. 5 mL 0  . carvedilol (COREG) 12.5 MG tablet Take 1 tablet (12.5 mg total) by mouth 2 (two) times daily with a meal. 60 tablet 1  . dorzolamide-timolol (COSOPT) 22.3-6.8 MG/ML ophthalmic solution Place 1 drop into the left eye 2 (two) times daily. 10 mL  12  . furosemide (LASIX) 40 MG tablet Take 1 tablet (40 mg total) by mouth daily. 30 tablet 5  . HYDROcodone-acetaminophen (NORCO) 5-325 MG tablet Take 1 tablet by mouth every 6 (six) hours as needed. (Patient taking differently: Take 1 tablet by mouth every 6 (six) hours as needed for moderate pain. ) 30 tablet 0  . insulin glargine (LANTUS) 100 UNIT/ML injection Inject 0.1 mLs (10 Units total) into the skin every morning. 10 mL 3  . isosorbide-hydrALAZINE (BIDIL) 20-37.5 MG tablet Take 1 tablet by mouth 3 (three) times daily. 270 tablet 1  . lisinopril (PRINIVIL,ZESTRIL) 5 MG tablet Take 1 tablet (5 mg total) by mouth daily. 90 tablet 1  . NOVOLOG 100 UNIT/ML injection INJECT 5 UNITS INTO THE SKIN 3 (THREE) TIMES DAILY BEFORE MEALS. DO NOT TAKE IF SUGAR IS <150 30 mL 2  . spironolactone (ALDACTONE) 25 MG tablet Take 1 tablet (25 mg total) by mouth daily. 90 tablet 1  . tamsulosin (FLOMAX) 0.4 MG CAPS capsule Take 2 capsules (0.8 mg total) by mouth daily after supper. 60 capsule 1  . Tetrahydrozoline HCl (VISINE OP) Place 1 drop into the left eye 2 (two) times daily. Only uses in left eye.    . vitamin C (ASCORBIC ACID) 500 MG tablet  Take 500 mg by mouth daily.     No current facility-administered medications for this encounter.    No Known Allergies    Social History   Social History  . Marital Status: Married    Spouse Name: N/A  . Number of Children: N/A  . Years of Education: N/A   Occupational History  . Not on file.   Social History Main Topics  . Smoking status: Former Smoker    Quit date: 05/10/2000  . Smokeless tobacco: Former Systems developer    Quit date: 02/22/2000  . Alcohol Use: No  . Drug Use: No  . Sexual Activity: Not on file   Other Topics Concern  . Not on file   Social History Narrative   ** Merged History Encounter **       Lives alone.       Family History  Problem Relation Age of Onset  . Cancer Mother   . Peripheral vascular disease Father     Danley Danker  Vitals:   09/01/15 1510  BP: 160/94  Pulse: 88  Resp: 18  Weight: 186 lb 12 oz (84.709 kg)  SpO2: 98%    PHYSICAL EXAM: General:  Sitting in [place] appearing. No respiratory difficulty HEENT: normal Neck: supple. no JVD. Carotids 2+ bilat; no bruits. No lymphadenopathy or thryomegaly appreciated. Cor: PMI nondisplaced. Regular rate & rhythm. No rubs, gallops or murmurs. Lungs: clear Abdomen: soft, nontender, nondistended. No hepatosplenomegaly. No bruits or masses. Good bowel sounds. Extremities: no cyanosis, clubbing, rash, 2+ edema on left s/p RBA with anterior wound on stump Neuro: alert & oriented x 3, cranial nerves grossly intact. moves all 4 extremities w/o difficulty. Affect pleasant.   ASSESSMENT & PLAN: 1. Chronic systolic HF due to NICM 2. HTN 3. DM2, poorly controlled 4. PAD s/p BKA 5. CKD stage 3   Suspect he has severe HTN cardiomyopathy. Improved but edema but coming back. Increase lasix 40 bid. Also increase Bidil to 2 tab TID. Will check BMET if K and creatinine improved can switch lisinopril to [facility] soon F/u 2-3 weeks. Will enroll in Paramedicine.   Bensimhon, Daniel,MD 3:27 PM

## 2015-09-01 NOTE — Patient Instructions (Signed)
INCREASE Lasix to 40 mg twice daily.  INCREASE Bidil to 2 tabs three times daily.  Routine lab work today. Will notify you of abnormal results, otherwise no news is good news!  Return for appointment with CHF clinical pharmacist in 2-3 weeks.  Follow up with Dr. Haroldine Laws in 2 months.  Do the following things EVERYDAY: 1) Weigh yourself in the morning before breakfast. Write it down and keep it in a log. 2) Take your medicines as prescribed 3) Eat low salt foods-Limit salt (sodium) to 2000 mg per day.  4) Stay as active as you can everyday 5) Limit all fluids for the day to less than 2 liters

## 2015-09-13 ENCOUNTER — Telehealth (HOSPITAL_COMMUNITY): Payer: Self-pay | Admitting: Surgery

## 2015-09-13 NOTE — Telephone Encounter (Signed)
Received message that patient has order to be referred to Heart Failure Paramedicine Program.  Referral completed and sent to [facility].

## 2015-09-19 ENCOUNTER — Ambulatory Visit (HOSPITAL_COMMUNITY)
Admission: RE | Admit: 2015-09-19 | Discharge: 2015-09-19 | Disposition: A | Discharge status: Home / Self Care | Payer: Medicaid Other | Source: Ambulatory Visit | Attending: Cardiology | Admitting: Cardiology

## 2015-09-19 VITALS — BP 158/108 | HR 90 | Ht 72.0 in | Wt 218.0 lb

## 2015-09-19 DIAGNOSIS — I130 Hypertensive heart and chronic kidney disease with heart failure and stage 1 through stage 4 chronic kidney disease, or unspecified chronic kidney disease: Secondary | ICD-10-CM | POA: Diagnosis present

## 2015-09-19 DIAGNOSIS — R0602 Shortness of breath: Secondary | ICD-10-CM | POA: Diagnosis not present

## 2015-09-19 DIAGNOSIS — I4581 Long QT syndrome: Secondary | ICD-10-CM | POA: Diagnosis not present

## 2015-09-19 DIAGNOSIS — E1122 Type 2 diabetes mellitus with diabetic chronic kidney disease: Secondary | ICD-10-CM | POA: Insufficient documentation

## 2015-09-19 DIAGNOSIS — E1165 Type 2 diabetes mellitus with hyperglycemia: Secondary | ICD-10-CM | POA: Insufficient documentation

## 2015-09-19 DIAGNOSIS — Z89511 Acquired absence of right leg below knee: Secondary | ICD-10-CM | POA: Insufficient documentation

## 2015-09-19 DIAGNOSIS — N183 Chronic kidney disease, stage 3 (moderate): Secondary | ICD-10-CM | POA: Insufficient documentation

## 2015-09-19 DIAGNOSIS — I5042 Chronic combined systolic (congestive) and diastolic (congestive) heart failure: Secondary | ICD-10-CM

## 2015-09-19 DIAGNOSIS — I5022 Chronic systolic (congestive) heart failure: Secondary | ICD-10-CM | POA: Diagnosis present

## 2015-09-19 DIAGNOSIS — I5023 Acute on chronic systolic (congestive) heart failure: Secondary | ICD-10-CM

## 2015-09-19 LAB — BASIC METABOLIC PANEL
Anion gap: 5 (ref 5–15)
BUN: 14 mg/dL (ref 6–20)
CO2: 30 mmol/L (ref 22–32)
Calcium: 8.8 mg/dL — ABNORMAL LOW (ref 8.9–10.3)
Chloride: 101 mmol/L (ref 101–111)
Creatinine, Ser: 1.06 mg/dL (ref 0.61–1.24)
GFR calc Af Amer: >60 mL/min (ref >60)
GFR calc non Af Amer: >60 mL/min (ref >60)
Glucose, Bld: 360 mg/dL — ABNORMAL HIGH (ref 65–99)
Potassium: 3.9 mmol/L (ref 3.5–5.1)
Sodium: 136 mmol/L (ref 135–145)

## 2015-09-19 MED ORDER — CARVEDILOL 3.125 MG PO TABS: 3.1250 mg | ORAL_TABLET | Freq: Two times a day (BID) | ORAL | Status: DC

## 2015-09-19 MED ORDER — FUROSEMIDE 40 MG PO TABS: 40.0000 mg | ORAL_TABLET | Freq: Two times a day (BID) | ORAL | Status: DC

## 2015-09-19 NOTE — Patient Instructions (Signed)
Increase Lasix to 3.125mg  twice daily.  Restart Carvedilol at 3.1225mg  twice daily.  Follow up in 2 weeks.

## 2015-09-19 NOTE — Progress Notes (Signed)
HPI:  [name] is a 60 year old AA male with a past medical history of HTN, DM, PAD s/p right BKA with non-healing wound (treated by Dr. Sharol Given) admitted for acute HF in 07/20/15. Diuresed 42 pounds 244 -->202. Echo on 07/10/15 showed EF of 15%, diffuse hypokinesis, PA pressure elevated at 73mmHg.   Presents today for pharmacist-led HF medication titration. At last HF clinic visit on 6/29, his Bidil was increased from 1 tab TID to 2 tabs TID and his furosemide was to be increased from 40 mg daily to BID. He did not increase his furosemide as directed because he stated that he did not remember that he was told to do so and he cannot read his labels well. Did not take any medications this am. Lives alone but gets around with wheelchair and walker. His sister helps as needed. Not wearing prosthesis given LE wound. Unable to weigh. [name] with paramedicine will start home visits with patient and assist with medication management.     . Shortness of breath/dyspnea on exertion? Yes  . Orthopnea/PND? Yes - 3 pillows, wakes up 2x/week . Edema? Yes - 2+ pitting edema in left leg, abdominal tightness . Lightheadedness/dizziness? No . Daily weights at home? No . Blood pressure/heart rate monitoring at home? Yes - usually around 160/120 mmHg . Following low-sodium/fluid-restricted diet? No - eats frozen dinners and drinks > 2 L of water, koolaid, coke zero   HF Medications: Furosemide 40 mg PO daily Bidil 2 tablets PO TID Lisinopril 5 mg PO daily Spironolactone 25 mg PO daily  *Has not taken carvedilol in over a month 2/2 running out of refills   Has the patient been experiencing any side effects to the medications prescribed?  Yes - gets nauseous taking his medications, have recommended taking with food to help with this  Does the patient have any problems obtaining medications due to transportation or finances?   No - has [place] Medicaid  Understanding of regimen: poor Understanding of indications:  poor Potential of compliance: poor Patient understands to avoid NSAIDs. Patient understands to avoid decongestants.    Pertinent Lab Values: . 09/19/15: Serum creatinine 1.06 (BL ~1), BUN 14, Potassium 3.9, Sodium 136  Vital Signs: . Weight: 218 lb (inaccurate 2/2 not wearing prosthesis today) . Blood pressure: 158/108 mmHg  . Heart rate: 90 bpm   REDS VEST READING= 47 CHEST RULER=13  VEST FITTING TASKS: POSTURE=Sitting (cannot stand with BKA and no prosthesis) HEIGHT MARKER=Tall CENTER STRIP=Aligned   Assessment: 1. Chronic systolic heart failure due to NICM (EF 15%). NYHA class III symptoms.  - Volume overload on exam including elevated REDS vest reading of 47 (wt inaccurate since did not have prosthesis on today) - Increase furosemide to 40 mg PO BID  - Restart carvedilol 3.125 mg PO BID - Continue Bidil 2 tabs TID, lisinopril 5 mg daily, spironolactone 25 mg daily - Basic disease state pathophysiology, medication indication, mechanism and side effects reviewed at length with patient and he verbalized understanding although literacy low  - [name] will plan to see patient at home this Thursday to assist with medication management and assess volume status 2. HTN - Continue Bidil, lisinopril and spironolactone as above - Add carvedilol today  3. DMII - poorly controlled 4. PAD s/p BKA 5. CKD stage 3    Plan: 1) Medication changes: Based on clinical presentation, vital signs and recent labs along with discussion with Oda Kilts, PA will increase furosemide to 40 mg PO BID and restart carvedilol  3.125 mg PO BID 2) Labs: BMET today  3) Follow-up: Amy in 2 weeks    Erika K. Velva Harman, PharmD, BCPS, CPP Clinical Pharmacist Pager: [phone] Phone: [phone] 09/19/2015 1:54 PM   He remains noncompliant. Volume status up. Agree with PharmD note as above. Continue to titrate HF meds as tolerated.   Bensimhon, Daniel,MD 10:39 PM

## 2015-10-04 DIAGNOSIS — S81009A Unspecified open wound, unspecified knee, initial encounter: Secondary | ICD-10-CM

## 2015-10-04 HISTORY — DX: Unspecified open wound, unspecified knee, initial encounter: S81.009A

## 2015-10-06 ENCOUNTER — Encounter (HOSPITAL_COMMUNITY): Payer: Self-pay

## 2015-10-06 ENCOUNTER — Encounter: Payer: Self-pay | Admitting: Licensed Clinical Social Worker

## 2015-10-06 ENCOUNTER — Encounter (HOSPITAL_COMMUNITY): Payer: Self-pay | Admitting: General Practice

## 2015-10-06 ENCOUNTER — Telehealth (HOSPITAL_COMMUNITY): Payer: Self-pay | Admitting: Cardiology

## 2015-10-06 ENCOUNTER — Inpatient Hospital Stay (HOSPITAL_COMMUNITY)
Admission: AD | Admit: 2015-10-06 | Discharge: 2015-10-10 | DRG: 291 | Disposition: A | Discharge status: Home Health | Payer: Medicaid Other | Source: Ambulatory Visit | Attending: Internal Medicine | Admitting: Internal Medicine

## 2015-10-06 ENCOUNTER — Ambulatory Visit (HOSPITAL_BASED_OUTPATIENT_CLINIC_OR_DEPARTMENT_OTHER)
Admission: RE | Admit: 2015-10-06 | Discharge: 2015-10-06 | Disposition: A | Discharge status: Home / Self Care | Payer: Medicaid Other | Source: Ambulatory Visit | Attending: Internal Medicine | Admitting: Internal Medicine

## 2015-10-06 VITALS — BP 168/108 | HR 87 | Wt 236.8 lb

## 2015-10-06 DIAGNOSIS — E1165 Type 2 diabetes mellitus with hyperglycemia: Secondary | ICD-10-CM | POA: Diagnosis present

## 2015-10-06 DIAGNOSIS — R0600 Dyspnea, unspecified: Secondary | ICD-10-CM

## 2015-10-06 DIAGNOSIS — I5023 Acute on chronic systolic (congestive) heart failure: Secondary | ICD-10-CM | POA: Diagnosis present

## 2015-10-06 DIAGNOSIS — Y835 Amputation of limb(s) as the cause of abnormal reaction of the patient, or of later complication, without mention of misadventure at the time of the procedure: Secondary | ICD-10-CM | POA: Diagnosis present

## 2015-10-06 DIAGNOSIS — R809 Proteinuria, unspecified: Secondary | ICD-10-CM | POA: Diagnosis not present

## 2015-10-06 DIAGNOSIS — I130 Hypertensive heart and chronic kidney disease with heart failure and stage 1 through stage 4 chronic kidney disease, or unspecified chronic kidney disease: Principal | ICD-10-CM | POA: Diagnosis present

## 2015-10-06 DIAGNOSIS — E876 Hypokalemia: Secondary | ICD-10-CM | POA: Diagnosis present

## 2015-10-06 DIAGNOSIS — T879 Unspecified complications of amputation stump: Secondary | ICD-10-CM

## 2015-10-06 DIAGNOSIS — E1159 Type 2 diabetes mellitus with other circulatory complications: Secondary | ICD-10-CM | POA: Diagnosis not present

## 2015-10-06 DIAGNOSIS — Z9114 Patient's other noncompliance with medication regimen: Secondary | ICD-10-CM

## 2015-10-06 DIAGNOSIS — E1122 Type 2 diabetes mellitus with diabetic chronic kidney disease: Secondary | ICD-10-CM | POA: Diagnosis present

## 2015-10-06 DIAGNOSIS — Z79899 Other long term (current) drug therapy: Secondary | ICD-10-CM | POA: Diagnosis not present

## 2015-10-06 DIAGNOSIS — T8789 Other complications of amputation stump: Secondary | ICD-10-CM | POA: Diagnosis present

## 2015-10-06 DIAGNOSIS — I420 Dilated cardiomyopathy: Secondary | ICD-10-CM | POA: Diagnosis present

## 2015-10-06 DIAGNOSIS — N183 Chronic kidney disease, stage 3 (moderate): Secondary | ICD-10-CM | POA: Diagnosis present

## 2015-10-06 DIAGNOSIS — Z87891 Personal history of nicotine dependence: Secondary | ICD-10-CM

## 2015-10-06 DIAGNOSIS — R0602 Shortness of breath: Secondary | ICD-10-CM | POA: Diagnosis present

## 2015-10-06 DIAGNOSIS — I158 Other secondary hypertension: Secondary | ICD-10-CM

## 2015-10-06 DIAGNOSIS — I10 Essential (primary) hypertension: Secondary | ICD-10-CM | POA: Insufficient documentation

## 2015-10-06 DIAGNOSIS — I5042 Chronic combined systolic (congestive) and diastolic (congestive) heart failure: Secondary | ICD-10-CM

## 2015-10-06 DIAGNOSIS — Z9111 Patient's noncompliance with dietary regimen: Secondary | ICD-10-CM | POA: Diagnosis not present

## 2015-10-06 DIAGNOSIS — Z794 Long term (current) use of insulin: Secondary | ICD-10-CM

## 2015-10-06 HISTORY — DX: Unspecified combined systolic (congestive) and diastolic (congestive) heart failure: I50.40

## 2015-10-06 HISTORY — DX: Unspecified open wound, unspecified knee, initial encounter: S81.009A

## 2015-10-06 HISTORY — DX: Acute kidney failure, unspecified: N17.9

## 2015-10-06 HISTORY — DX: Patient's other noncompliance with medication regimen for other reason: Z91.148

## 2015-10-06 HISTORY — DX: Blindness, one eye, unspecified eye: H54.40

## 2015-10-06 HISTORY — DX: Unspecified protein-calorie malnutrition: E46

## 2015-10-06 HISTORY — DX: Peripheral vascular disease, unspecified: I73.9

## 2015-10-06 HISTORY — DX: Patient's other noncompliance with medication regimen: Z91.14

## 2015-10-06 HISTORY — DX: Reserved for inherently not codable concepts without codable children: IMO0001

## 2015-10-06 HISTORY — DX: Type 2 diabetes mellitus with diabetic neuropathy, unspecified: E11.40

## 2015-10-06 HISTORY — DX: Anemia, unspecified: D64.9

## 2015-10-06 HISTORY — DX: Acute on chronic systolic (congestive) heart failure: I50.23

## 2015-10-06 HISTORY — DX: Dilated cardiomyopathy: I42.0

## 2015-10-06 HISTORY — DX: Unspecified complications of amputation stump: T87.9

## 2015-10-06 LAB — CBC WITH DIFFERENTIAL/PLATELET
Basophils Absolute: 0 10*3/uL (ref 0.0–0.1)
Basophils Relative: 0 %
Eosinophils Absolute: 0 10*3/uL (ref 0.0–0.7)
Eosinophils Relative: 0 %
HCT: 40.6 % (ref 39.0–52.0)
Hemoglobin: 12.5 g/dL — ABNORMAL LOW (ref 13.0–17.0)
Lymphocytes Relative: 16 %
Lymphs Abs: 1.2 10*3/uL (ref 0.7–4.0)
MCH: 24.2 pg — ABNORMAL LOW (ref 26.0–34.0)
MCHC: 30.8 g/dL (ref 30.0–36.0)
MCV: 78.7 fL (ref 78.0–100.0)
Monocytes Absolute: 0.5 10*3/uL (ref 0.1–1.0)
Monocytes Relative: 6 %
Neutro Abs: 5.9 10*3/uL (ref 1.7–7.7)
Neutrophils Relative %: 78 %
Platelets: 263 10*3/uL (ref 150–400)
RBC: 5.16 MIL/uL (ref 4.22–5.81)
RDW: 17.9 % — ABNORMAL HIGH (ref 11.5–15.5)
WBC: 7.6 10*3/uL (ref 4.0–10.5)

## 2015-10-06 LAB — GLUCOSE, CAPILLARY
Glucose-Capillary: 320 mg/dL — ABNORMAL HIGH (ref 65–99)
Glucose-Capillary: 232 mg/dL — ABNORMAL HIGH (ref 65–99)

## 2015-10-06 LAB — COMPREHENSIVE METABOLIC PANEL
ALT: 23 U/L (ref 17–63)
AST: 20 U/L (ref 15–41)
Albumin: 2.5 g/dL — ABNORMAL LOW (ref 3.5–5.0)
Alkaline Phosphatase: 124 U/L (ref 38–126)
Anion gap: 9 (ref 5–15)
BUN: 17 mg/dL (ref 6–20)
CO2: 26 mmol/L (ref 22–32)
Calcium: 7.8 mg/dL — ABNORMAL LOW (ref 8.9–10.3)
Chloride: 104 mmol/L (ref 101–111)
Creatinine, Ser: 1.11 mg/dL (ref 0.61–1.24)
GFR calc Af Amer: >60 mL/min (ref >60)
GFR calc non Af Amer: >60 mL/min (ref >60)
Glucose, Bld: 316 mg/dL — ABNORMAL HIGH (ref 65–99)
Potassium: 3.2 mmol/L — ABNORMAL LOW (ref 3.5–5.1)
Sodium: 139 mmol/L (ref 135–145)
Total Bilirubin: 1.1 mg/dL (ref 0.3–1.2)
Total Protein: 5.7 g/dL — ABNORMAL LOW (ref 6.5–8.1)

## 2015-10-06 LAB — MAGNESIUM: Magnesium: 1.5 mg/dL — ABNORMAL LOW (ref 1.7–2.4)

## 2015-10-06 LAB — BRAIN NATRIURETIC PEPTIDE: B Natriuretic Peptide: 1672.8 pg/mL — ABNORMAL HIGH (ref 0.0–100.0)

## 2015-10-06 LAB — TSH: TSH: 0.578 u[IU]/mL (ref 0.350–4.500)

## 2015-10-06 MED ORDER — SODIUM CHLORIDE 0.9% FLUSH: 3.0000 mL | INTRAVENOUS | Status: DC | PRN

## 2015-10-06 MED ORDER — ACETAMINOPHEN 325 MG PO TABS
650.0000 mg | ORAL_TABLET | ORAL | Status: DC | PRN
  Filled 2015-10-06: qty 2

## 2015-10-06 MED ORDER — INSULIN ASPART 100 UNIT/ML ~~LOC~~ SOLN
0.0000 [IU] | Freq: Three times a day (TID) | SUBCUTANEOUS | Status: DC
Start: 2015-10-06 — End: 2015-10-10
  Administered 2015-10-06: 7 [IU] via SUBCUTANEOUS
  Administered 2015-10-07: 2 [IU] via SUBCUTANEOUS
  Administered 2015-10-07: 1 [IU] via SUBCUTANEOUS
  Administered 2015-10-08: 9 [IU] via SUBCUTANEOUS
  Administered 2015-10-08: 1 [IU] via SUBCUTANEOUS
  Administered 2015-10-08: 2 [IU] via SUBCUTANEOUS
  Administered 2015-10-09: 5 [IU] via SUBCUTANEOUS
  Administered 2015-10-09 (×2): 2 [IU] via SUBCUTANEOUS
  Administered 2015-10-10: 3 [IU] via SUBCUTANEOUS
  Administered 2015-10-10: 1 [IU] via SUBCUTANEOUS

## 2015-10-06 MED ORDER — POTASSIUM CHLORIDE CRYS ER 20 MEQ PO TBCR
40.0000 meq | EXTENDED_RELEASE_TABLET | Freq: Once | ORAL | Status: AC
  Administered 2015-10-06: 40 meq via ORAL
  Filled 2015-10-06: qty 2

## 2015-10-06 MED ORDER — SODIUM CHLORIDE 0.9 % IV SOLN: 250.0000 mL | INTRAVENOUS | Status: DC | PRN

## 2015-10-06 MED ORDER — ONDANSETRON HCL 4 MG/2ML IJ SOLN: 4.0000 mg | Freq: Four times a day (QID) | INTRAMUSCULAR | Status: DC | PRN

## 2015-10-06 MED ORDER — FUROSEMIDE 10 MG/ML IJ SOLN
80.0000 mg | Freq: Two times a day (BID) | INTRAMUSCULAR | Status: DC
  Administered 2015-10-06 – 2015-10-08 (×4): 80 mg via INTRAVENOUS
  Filled 2015-10-06 (×4): qty 8

## 2015-10-06 MED ORDER — ENOXAPARIN SODIUM 40 MG/0.4ML ~~LOC~~ SOLN
40.0000 mg | SUBCUTANEOUS | Status: DC
  Administered 2015-10-06 – 2015-10-09 (×4): 40 mg via SUBCUTANEOUS
  Filled 2015-10-06 (×5): qty 0.4

## 2015-10-06 MED ORDER — INSULIN ASPART 100 UNIT/ML ~~LOC~~ SOLN
0.0000 [IU] | Freq: Every day | SUBCUTANEOUS | Status: DC
  Administered 2015-10-06: 2 [IU] via SUBCUTANEOUS

## 2015-10-06 MED ORDER — ATORVASTATIN CALCIUM 40 MG PO TABS
40.0000 mg | ORAL_TABLET | Freq: Every day | ORAL | Status: DC
  Administered 2015-10-06 – 2015-10-09 (×4): 40 mg via ORAL
  Filled 2015-10-06 (×4): qty 1

## 2015-10-06 MED ORDER — SODIUM CHLORIDE 0.9% FLUSH
3.0000 mL | Freq: Two times a day (BID) | INTRAVENOUS | Status: DC
  Administered 2015-10-06 – 2015-10-10 (×9): 3 mL via INTRAVENOUS

## 2015-10-06 NOTE — Progress Notes (Signed)
CSW met with patient in the clinic as he is known to paramedicine program. Patient states he has not been taking his medications as prescribed as "they make me feel bad". CSW discussed medication compliance and means to assist patient. CSW discussed paramedcine program and assistance provided through the services. Patient to be admitted today for volume overload and agreeable to follow up post discharge. [name], LCSW [phone]

## 2015-10-06 NOTE — Telephone Encounter (Signed)
Patient to be directly admitted from CHF clinic today  Per Dr Haroldine Laws Cpt [phone] With patients current insurance- [place] [employer] No pre cert/notifacation  is required

## 2015-10-06 NOTE — Addendum Note (Signed)
Encounter addended by: Effie Berkshire, RN on: 10/06/2015  3:21 PM<BR>    Actions taken: Visit diagnoses modified, Order Entry activity accessed, Diagnosis association updated

## 2015-10-06 NOTE — Progress Notes (Addendum)
Advanced Heart Failure Medication Review by a Pharmacist  Does the patient  feel that his/her medications are working for him/her?  yes  Has the patient been experiencing any side effects to the medications prescribed?  yes  Does the patient measure his/her own blood pressure or blood glucose at home?  no   Does the patient have any problems obtaining medications due to transportation or finances?   no  Understanding of regimen: poor Understanding of indications: poor Potential of compliance: poor Patient understands to avoid NSAIDs. Patient understands to avoid decongestants.  Issues to address at subsequent visits: Compliance   Pharmacist comments:  [name] is a 60 yo M presenting with his medication bottles and [name] (paramedicine). He reports not taking any of his HF medications since this past Monday 2/2 stomach upset and headache. We discussed extensively the importance of continued consistent use of his medications and recommended taking his medications with some food to prevent the stomach upset. We may need to be more cautious with Bidil since this is likely causing the headache.   Ruta Hinds. Velva Harman, PharmD, BCPS, CPP Clinical Pharmacist Pager: [phone] Phone: [phone] 10/06/2015 11:30 AM     Time with patient: 20 minutes Preparation and documentation time: 4 minutes Total time: 24 minutes  ------  REDS VEST READING= 44 CHEST RULER=10  VEST FITTING TASKS: POSTURE=Sitting (BKA) HEIGHT MARKER=Tall CENTER STRIP=Shifted slightly   COMMENTS:BKA so can't stand, large frame

## 2015-10-06 NOTE — Progress Notes (Signed)
Patient ID: [name], male   DOB: 02-12-1956, 60 y.o.   MRN: [id number]   [facility]   HPI: [name] is a 60 year old male with a past medical history of HTN, DM, :PAD S/p right BKA with non-haling wound, admitted for acute HF in 5/17// Echo on 07/10/15 showed EF of 15%, diffuse hypokinesis, PA pressure elevated at 54mmHg.   Was hospitalized 5/5 - 07/20/15  Here for post hospital f/u. Diuresed 42 pounds 244->202  Today he returns for follow up. Has not been taking his medications over the last few weeks. SOB with exertion. + Orthopnea. Not able to wear prosthesis due wound. He has been seen by Paramedicine. Lives alone. Having trouble getting his food. Requires assistance with transportation.   Cath 07/13/15  Cardiac Cath Findings: Ao = 135/87 (107) LV = 141/20/30 RA = 17 RV = 67/11/20 PA = 62/29 (41) PCW = 35 (v wave 45-50) Fick cardiac output/index = 6.0/2.7 PVR = 1.0 WU SVR = 1198  FA sat = 88% PA sat = 58%, 57%  Labs (5/17): K 4.9 Cr 1.44 Labs (09/19/2015) : K 3.9 Creatinine 1.06     Past Medical History:  Diagnosis Date  . Congestive dilated cardiomyopathy ([place]) 07/11/2015  . Diabetes mellitus   . Hypertension     Current Outpatient Prescriptions  Medication Sig Dispense Refill  . brimonidine (ALPHAGAN) 0.2 % ophthalmic solution PLACE 1 DROP INTO THE LEFT EYE 2 (TWO) TIMES DAILY. 5 mL 0  . dorzolamide-timolol (COSOPT) 22.3-6.8 MG/ML ophthalmic solution Place 1 drop into the left eye 2 (two) times daily. 10 mL 12  . insulin glargine (LANTUS) 100 UNIT/ML injection Inject 0.1 mLs (10 Units total) into the skin every morning. 10 mL 3  . NOVOLOG 100 UNIT/ML injection INJECT 5 UNITS INTO THE SKIN 3 (THREE) TIMES DAILY BEFORE MEALS. DO NOT TAKE IF SUGAR IS <150 30 mL 2  . Tetrahydrozoline HCl (VISINE OP) Place 1 drop into the left eye 2 (two) times daily. Only uses in left eye.    Marland Kitchen atorvastatin (LIPITOR) 40 MG tablet Take 1 tablet (40 mg total) by mouth daily.  (Patient not taking: Reported on 10/06/2015) 90 tablet 1  . vitamin C (ASCORBIC ACID) 500 MG tablet Take 500 mg by mouth daily.     No current facility-administered medications for this encounter.     No Known Allergies    Social History   Social History  . Marital status: Married    Spouse name: N/A  . Number of children: N/A  . Years of education: N/A   Occupational History  . Not on file.   Social History Main Topics  . Smoking status: Former Smoker    Quit date: 05/10/2000  . Smokeless tobacco: Former Systems developer    Quit date: 02/22/2000  . Alcohol use No  . Drug use: No  . Sexual activity: Not on file   Other Topics Concern  . Not on file   Social History Narrative   ** Merged History Encounter **       Lives alone.       Family History  Problem Relation Age of Onset  . Cancer Mother   . Peripheral vascular disease Father     Vitals:   10/06/15 1055  BP: (!) 168/108  Pulse: 87  SpO2: 93%  Weight: 236 lb 12.8 oz (107.4 kg)    PHYSICAL EXAM: General:  Sitting in WC. Mild dyspnea talking.  HEENT: normal Neck: supple.  JVD to jaw. Carotids 2+ bilat; no bruits. No lymphadenopathy or thryomegaly appreciated. Cor: PMI nondisplaced. Regular rate & rhythm. No rubs, or murmurs.+ S3 Lungs: clear Abdomen: soft, nontender, nondistended. No hepatosplenomegaly. No bruits or masses. Good bowel sounds. Extremities: no cyanosis, clubbing, rash, 2+ edema on left s/p RBA with anterior wound on stump LLE 3+ edema R BKA edema 2+  Neuro: alert & oriented x 3, cranial nerves grossly intact. moves all 4 extremities w/o difficulty. Affect pleasant.   ASSESSMENT & PLAN: 1. Acute/Chronic systolic HF due to NICM NYHA III-IV. Volume status elevated in the setting of medication noncompliance and increased fluid intake. Has not been taking medications.  Admit for IV diuresis. Marked volume overload.  2. HTN- Elevated.  3. DM2, poorly controlled-  4. PAD s/p BKA 5. CKD stage 3  6. R  BKA wound- Consult WOC  Admit to telemetry for IV diuresis. Will need HH once discharged. May need SNF once fully diuresed.    Amy Clegg,NP-C  11:26 AM

## 2015-10-06 NOTE — H&P (Addendum)
ADVANCED HF H & P    HPI: [name] is a 60 year old male with a past medical history of HTN, DM, :PAD S/p right BKA with non-haling wound, admitted for acute HF in 5/17// Echo on 07/10/15 showed EF of 15%, diffuse hypokinesis, PA pressure elevated at 28mmHg.   Was hospitalized 5/5 - 07/20/15  Here for post hospital f/u. Diuresed 42 pounds 244->202  Today he returns for follow up. Has not been taking his medications over the last few weeks. SOB with exertion. + Orthopnea. Not able to wear prosthesis due wound. He has been seen by Paramedicine. Lives alone. Having trouble getting his food. Requires assistance with transportation.   Cath 07/13/15  Cardiac Cath Findings: Ao = 135/87 (107) LV = 141/20/30 RA = 17 RV = 67/11/20 PA = 62/29 (41) PCW = 35 (v wave 45-50) Fick cardiac output/index = 6.0/2.7 PVR = 1.0 WU SVR = 1198  FA sat = 88% PA sat = 58%, 57%  Labs (5/17): K 4.9 Cr 1.44 Labs (09/19/2015) : K 3.9 Creatinine 1.06         Past Medical History:  Diagnosis Date  . Congestive dilated cardiomyopathy ([place]) 07/11/2015  . Diabetes mellitus   . Hypertension           Current Outpatient Prescriptions  Medication Sig Dispense Refill  . brimonidine (ALPHAGAN) 0.2 % ophthalmic solution PLACE 1 DROP INTO THE LEFT EYE 2 (TWO) TIMES DAILY. 5 mL 0  . dorzolamide-timolol (COSOPT) 22.3-6.8 MG/ML ophthalmic solution Place 1 drop into the left eye 2 (two) times daily. 10 mL 12  . insulin glargine (LANTUS) 100 UNIT/ML injection Inject 0.1 mLs (10 Units total) into the skin every morning. 10 mL 3  . NOVOLOG 100 UNIT/ML injection INJECT 5 UNITS INTO THE SKIN 3 (THREE) TIMES DAILY BEFORE MEALS. DO NOT TAKE IF SUGAR IS <150 30 mL 2  . Tetrahydrozoline HCl (VISINE OP) Place 1 drop into the left eye 2 (two) times daily. Only uses in left eye.    Marland Kitchen atorvastatin (LIPITOR) 40 MG tablet Take 1 tablet (40 mg total) by mouth daily. (Patient not taking: Reported on 10/06/2015) 90  tablet 1  . vitamin C (ASCORBIC ACID) 500 MG tablet Take 500 mg by mouth daily.     No current facility-administered medications for this encounter.     No Known Allergies    Social History        Social History  . Marital status: Married    Spouse name: N/A  . Number of children: N/A  . Years of education: N/A      Occupational History  . Not on file.        Social History Main Topics  . Smoking status: Former Smoker    Quit date: 05/10/2000  . Smokeless tobacco: Former Systems developer    Quit date: 02/22/2000  . Alcohol use No  . Drug use: No  . Sexual activity: Not on file       Other Topics Concern  . Not on file      Social History Narrative   ** Merged History Encounter **       Lives alone.            Family History  Problem Relation Age of Onset  . Cancer Mother   . Peripheral vascular disease Father        Vitals:   10/06/15 1055  BP: (!) 168/108  Pulse: 87  SpO2: 93%  Weight: 236 lb 12.8 oz (107.4 kg)    PHYSICAL EXAM: General:  Sitting in [place]. Mild dyspnea talking.  HEENT: normal Neck: supple. JVD to jaw. Carotids 2+ bilat; no bruits. No lymphadenopathy or thryomegaly appreciated. Cor: PMI nondisplaced. Regular rate & rhythm. No rubs, or murmurs.+ S3 Lungs: basilar crackles,  Abdomen: soft, nontender, nondistended. No hepatosplenomegaly. No bruits or masses. Good bowel sounds. Extremities: no cyanosis, clubbing, rash, 2+ edema on left s/p RBA with anterior wound on stump LLE 3+ edema R BKA edema 2-3+  Neuro: alert & oriented x 3, cranial nerves grossly intact. moves all 4 extremities w/o difficulty. Affect pleasant.   ASSESSMENT & PLAN: 1. Acute/Chronic systolic HF due to NICM NYHA III-IV. Volume status elevated in the setting of medication noncompliance and increased fluid intake. Has not been taking medications.  Admit for IV diuresis. Marked volume overload.  2. HTN- Elevated.  3. DM2, poorly controlled-  4.  PAD s/p BKA 5. CKD stage 3  6. R BKA wound- Consult WOC 7. Hypokalemia/hypomagnesemia  Admit to telemetry for IV diuresis. Will need HH once discharged. May need SNF once fully diuresed.    Amy Clegg,NP-C  11:26 AM  Patient seen and examined with Darrick Grinder, NP. We discussed all aspects of the encounter. I agree with the assessment and plan as stated above.   Volume status markedly elevated in setting of medication non-compliance. Long discussion with him and family about need for medication compliance or risk of death and recurrent readmissions. Admit for diuresis and reinitiation of HF meds. Watch renal function and electrolytes. Supp K and MAg

## 2015-10-07 ENCOUNTER — Inpatient Hospital Stay (HOSPITAL_COMMUNITY): Payer: Medicaid Other

## 2015-10-07 LAB — HEMOGLOBIN A1C
Hgb A1c MFr Bld: 13.1 % — ABNORMAL HIGH (ref 4.8–5.6)
Mean Plasma Glucose: 329 mg/dL

## 2015-10-07 LAB — GLUCOSE, CAPILLARY
Glucose-Capillary: 308 mg/dL — ABNORMAL HIGH (ref 65–99)
Glucose-Capillary: 116 mg/dL — ABNORMAL HIGH (ref 65–99)
Glucose-Capillary: 141 mg/dL — ABNORMAL HIGH (ref 65–99)
Glucose-Capillary: 166 mg/dL — ABNORMAL HIGH (ref 65–99)
Glucose-Capillary: 164 mg/dL — ABNORMAL HIGH (ref 65–99)

## 2015-10-07 LAB — BASIC METABOLIC PANEL
Anion gap: 7 (ref 5–15)
BUN: 19 mg/dL (ref 6–20)
CO2: 31 mmol/L (ref 22–32)
Calcium: 8.7 mg/dL — ABNORMAL LOW (ref 8.9–10.3)
Chloride: 102 mmol/L (ref 101–111)
Creatinine, Ser: 1.11 mg/dL (ref 0.61–1.24)
GFR calc Af Amer: >60 mL/min (ref >60)
GFR calc non Af Amer: >60 mL/min (ref >60)
Glucose, Bld: 111 mg/dL — ABNORMAL HIGH (ref 65–99)
Potassium: 3.5 mmol/L (ref 3.5–5.1)
Sodium: 140 mmol/L (ref 135–145)

## 2015-10-07 LAB — MAGNESIUM: Magnesium: 1.5 mg/dL — ABNORMAL LOW (ref 1.7–2.4)

## 2015-10-07 MED ORDER — POTASSIUM CHLORIDE CRYS ER 20 MEQ PO TBCR
40.0000 meq | EXTENDED_RELEASE_TABLET | Freq: Once | ORAL | Status: AC
  Administered 2015-10-07: 40 meq via ORAL
  Filled 2015-10-07: qty 2

## 2015-10-07 MED ORDER — SACUBITRIL-VALSARTAN 24-26 MG PO TABS
1.0000 | ORAL_TABLET | Freq: Two times a day (BID) | ORAL | Status: DC
  Administered 2015-10-07 – 2015-10-08 (×2): 1 via ORAL
  Filled 2015-10-07 (×5): qty 1

## 2015-10-07 MED ORDER — POTASSIUM CHLORIDE CRYS ER 20 MEQ PO TBCR
40.0000 meq | EXTENDED_RELEASE_TABLET | Freq: Two times a day (BID) | ORAL | Status: DC
  Administered 2015-10-07 – 2015-10-10 (×7): 40 meq via ORAL
  Filled 2015-10-07 (×7): qty 2

## 2015-10-07 MED ORDER — MAGNESIUM SULFATE 4 GM/100ML IV SOLN
4.0000 g | Freq: Once | INTRAVENOUS | Status: AC
  Administered 2015-10-07: 4 g via INTRAVENOUS
  Filled 2015-10-07: qty 100

## 2015-10-07 MED ORDER — METOLAZONE 2.5 MG PO TABS
2.5000 mg | ORAL_TABLET | Freq: Once | ORAL | Status: AC
  Administered 2015-10-07: 2.5 mg via ORAL
  Filled 2015-10-07: qty 1

## 2015-10-07 NOTE — Progress Notes (Signed)
Heart Failure Navigator Consult Note  Presentation: [name] is a 60 year old male with a past medical history of HTN, DM, :PAD S/p right BKA with non-healing wound, admitted for acute HF in 5/17// Echo on 07/10/15 showed EF of 15%, diffuse hypokinesis, PA pressure elevated at 50mmHg.   Was hospitalized 5/5 - 07/20/15 .  Admitted from [facility] on 10/06/15.  Past Medical History:  Diagnosis Date  . Acute on chronic systolic heart failure, NYHA class 3 ([place])   . AKI (acute kidney injury) ([place])   . Anemia   . BKA stump complication ([place])   . Blind left eye   . Cardiomyopathy, dilated ([place])   . Combined systolic and diastolic congestive heart failure ([place])   . Congestive dilated cardiomyopathy ([place]) 07/11/2015  . Diabetes mellitus   . Diabetic neuropathy associated with type 2 diabetes mellitus ([place])   . Hypertension   . Noncompliance with medications   . Open knee wound 10/2015   on rt bka  . PAD (peripheral artery disease) ([place])   . Protein calorie malnutrition ([place])   . Shortness of breath dyspnea     Social History   Social History  . Marital status: Married    Spouse name: N/A  . Number of children: N/A  . Years of education: N/A   Social History Main Topics  . Smoking status: Former Smoker    Quit date: 05/10/2000  . Smokeless tobacco: Former Systems developer    Quit date: 02/22/2000  . Alcohol use No  . Drug use: No  . Sexual activity: Not Asked   Other Topics Concern  . None   Social History Narrative   ** Merged History Encounter **       Lives alone.     ECHO:Study Conclusions-07/10/15  - Left ventricle: The cavity size was mildly dilated. Wall   thickness was increased in a pattern of mild LVH. Systolic   function was severely reduced. The estimated ejection fraction was 15%. Diffuse hypokinesis. Doppler parameters are consistent   with restrictive physiology, indicative of decreased left   ventricular diastolic compliance and/or increased left atrial  pressure. Doppler parameters are consistent with high ventricular   filling pressure. - Mitral valve: There was mild regurgitation. - Left atrium: The atrium was mildly dilated. - Right ventricle: The cavity size was mildly dilated. Systolic   function was severely reduced. - Right atrium: The atrium was mildly dilated. - Tricuspid valve: There was moderate-severe regurgitation. - Pulmonary arteries: Systolic pressure was severely increased. PA   peak pressure: 67 mm Hg (S). - Pericardium, extracardiac: A small pericardial effusion was   identified.  Impressions:  - Severe global reduction in LV function; restrictive filling; 4   chamber enlargement; severely reduced RV function; mild MR;   moderate to severe TR; severely elevated pulmonary pressue.  Transthoracic echocardiography.  M-mode, complete 2D, spectral Doppler, and color Doppler.  Birthdate:  Patient birthdate: [birth year].  Age:  Patient is 60 yr old.  Sex:  Gender: male. BMI: 32.3 kg/m^2.  Blood pressure:     154/106  Patient status: Inpatient.  Study date:  Study date: 07/10/2015. Study time: 11:42 AM.  Location:  Bedside.  BNP    Component Value Date/Time   BNP 1,672.8 (H) 10/06/2015 1509   BNP 1,617.0 (H) 07/06/2015 1056    ProBNP No results found for: PROBNP   Education Assessment and Provision:  Detailed education and instructions provided on heart failure disease management including the following:  Signs  and symptoms of Heart Failure When to call the physician Importance of daily weights Low sodium diet Fluid restriction Medication management Anticipated future follow-up appointments  Patient education given on each of the above topics.  Patient acknowledges understanding and acceptance of all instructions.  I spoke with [name] regarding his current hospitalization and HF diagnosis.  He tells me that he has not been taking medications because they were making him"feel bad".  I encouraged that  the medications were extremely important and he needs to take all prescribed meds.  He does have a scale and has been weighing.  I reminded him of the importance of daily weights and how they relate to the signs and symptoms of HF.  He tells me that he does his own meal prep and mostly eats canned foods.  He lives alone in [place] and has sisters that can help if needed.  He seems to lack insight into his health and have limited health literacy.  He was referred to the [facility] during last admission due to high risk for readmission.    Education Materials:  "Living Better With Heart Failure" Booklet, Daily Weight Tracker Tool    High Risk Criteria for Readmission and/or Poor Patient Outcomes:  (Recommend Follow-up with Advanced Heart Failure Clinic)--yes   EF <30%-  2 or more admissions in 6 months- yes 3/6 mo  Difficult social situation- Yes -lives alone   Demonstrates medication noncompliance- Yes--because they "made him "feel bad"   Barriers of Care:  Health Literacy--? understanding Knowledge and compliance   Discharge Planning:   Plans to return to home alone.  He will remain with [employer] and has been referred for [facility] as well.

## 2015-10-07 NOTE — Progress Notes (Signed)
Advanced Heart Failure Rounding Note   Subjective:    Admitted with volume overload. Diuresing with IV lasix. Negative 1.6 liters.  Feeling ok. Denies SOB.     Objective:   Weight Range:  Vital Signs:   Temp:  [97.9 F (36.6 C)-98.4 F (36.9 C)] 97.9 F (36.6 C) (08/04 0646) Pulse Rate:  [76-85] 76 (08/04 0646) Resp:  [17-20] 17 (08/03 2250) BP: (138-159)/(98-105) 142/100 (08/04 0646) SpO2:  [94 %-97 %] 97 % (08/04 0646) Weight:  [232 lb 4.8 oz (105.4 kg)-236 lb 8.9 oz (107.3 kg)] 232 lb 4.8 oz (105.4 kg) (08/04 0646) Last BM Date: 10/06/15  Weight change: Filed Weights   10/06/15 1530 10/07/15 0646  Weight: 236 lb 8.9 oz (107.3 kg) 232 lb 4.8 oz (105.4 kg)    Intake/Output:   Intake/Output Summary (Last 24 hours) at 10/07/15 0826 Last data filed at 10/07/15 0648  Gross per 24 hour  Intake              243 ml  Output             1900 ml  Net            -1657 ml     Physical Exam: General:  Well appearing. No resp difficulty. In bed.  HEENT: normal Neck: supple. JVP to jaw.  Carotids 2+ bilat; no bruits. No lymphadenopathy or thryomegaly appreciated. Cor: PMI nondisplaced. Regular rate & rhythm. No rubs, gallops or murmurs. Lungs: clear Abdomen: soft, nontender, nondistended. No hepatosplenomegaly. No bruits or masses. Good bowel sounds. Extremities: no cyanosis, clubbing, rash, R BKA wound. LLE 3+ edema. edema Neuro: alert & orientedx3, cranial nerves grossly intact. moves all 4 extremities w/o difficulty. Affect pleasant  Telemetry: NSR 80s   Labs: Basic Metabolic Panel:  Recent Labs Lab 10/06/15 1508 10/07/15 0355  NA 139 140  K 3.2* 3.5  CL 104 102  CO2 26 31  GLUCOSE 316* 111*  BUN 17 19  CREATININE 1.11 1.11  CALCIUM 7.8* 8.7*  MG 1.5* 1.5*    Liver Function Tests:  Recent Labs Lab 10/06/15 1508  AST 20  ALT 23  ALKPHOS 124  BILITOT 1.1  PROT 5.7*  ALBUMIN 2.5*   No results for input(s): LIPASE, AMYLASE in the last 168  hours. No results for input(s): AMMONIA in the last 168 hours.  CBC:  Recent Labs Lab 10/06/15 1508  WBC 7.6  NEUTROABS 5.9  HGB 12.5*  HCT 40.6  MCV 78.7  PLT 263    Cardiac Enzymes: No results for input(s): CKTOTAL, CKMB, CKMBINDEX, TROPONINI in the last 168 hours.  BNP: BNP (last 3 results)  Recent Labs  07/06/15 1056 07/08/15 1825 10/06/15 1509  BNP 1,617.0* 1,764.5* 1,672.8*    ProBNP (last 3 results) No results for input(s): PROBNP in the last 8760 hours.    Other results:  Imaging: Dg Chest Port 1 View  Result Date: 10/07/2015 CLINICAL DATA:  Dyspnea. EXAM: PORTABLE CHEST 1 VIEW COMPARISON:  Radiographs of Jul 08, 2015. FINDINGS: Stable cardiomegaly. No pneumothorax or pleural effusion is noted. Bony thorax is unremarkable. No acute pulmonary disease is noted. IMPRESSION: No acute cardiopulmonary abnormality seen. Electronically Signed   By: Marijo Conception, M.D.   On: 10/07/2015 07:29      Medications:     Scheduled Medications: . atorvastatin  40 mg Oral q1800  . enoxaparin (LOVENOX) injection  40 mg Subcutaneous Q24H  . furosemide  80 mg Intravenous BID  . insulin  aspart  0-5 Units Subcutaneous QHS  . insulin aspart  0-9 Units Subcutaneous TID WC  . sodium chloride flush  3 mL Intravenous Q12H     Infusions:     PRN Medications:  sodium chloride, acetaminophen, ondansetron (ZOFRAN) IV, sodium chloride flush   Assessment:  1. Acute/Chronic systolic HF due to NICM. ECHO 07/2015 EF 15%. NYHA III-IV. Volume status elevated in the setting of medication noncompliance and increased fluid intake. Has not been taking medications in weeks. BNP on admit 1672.  Continue IV lasix 80 mg twice a day. Give 40 meq potassium twice daily Hold off on carvedilol for now  Add entresto 24-26 mg twice a day.  Would not use spiro or dig due to noncompliance.   Not going to add bidil for now because I doubt he will take 3 times a day.  2. HTN- Elevated. Add  entresto as above.  3. DM2, poorly controlled- Add sliding scale . Consult diabetes coordinator.  4. PAD s/p BKA 5. CKD stage 3 - Renal function stable today.  6. R BKA wound- Consult WOC  I have ordered [facility] for d/c. Will need to make medication regimen as easy as possible. He is at high risk for readmit due to poor insight. Plan to continue Paramedicine.   Length of Stay: 1  Amy Clegg NP-C  10/07/2015, 8:26 AM  Advanced Heart Failure Team Pager [phone] (M-F; 7a - 4p)  Please contact [facility] for night-coverage after hours (4p -7a ) and weekends on [email or web address]  Patient seen and examined with Darrick Grinder, NP. We discussed all aspects of the encounter. I agree with the assessment and plan as stated above.   Remains volume overloaded. Continue diuresis. Will give dose of metolazone today. Agree with Delene Loll although he has not been compliant with meds previously. On d/c will need to narrow his medicines down to just 3 or 4 of most important meds. Continue Paramedicine. Wound Care consulted.    Bensimhon, Daniel,MD 8:56 AM

## 2015-10-07 NOTE — Progress Notes (Signed)
Pt slept on and off overnight, vitals stable, denies any pain, SOB and distress, in RA, oxygen sat is 94%, using urinal, will continue to monitor the patient.

## 2015-10-07 NOTE — Care Management Note (Signed)
Case Management Note  Patient Details  Name: [name] MRN: [id number] Date of Birth: 05/10/1955  Subjective/Objective:      Admitted with CHF              Action/Plan: Patient known to me from previous admission; PCP is Dr white with [facility]; [facility] Insurance with prescription drug coverage;  Pharmacy of choice is [facility], pt reports not problem getting his medication; Lives at home alone; his sister takes him to his apts; DME - wheelchair, walker,cane 3;1; CM talked to him about his diet, pt states that he cooks all of his food/ low sodium; he has scales at home but does not weigh himself daily, CM encouraged him to do so, Patient could benefit greatly from a [facility] service; [place] choice offered, pt chose [facility]; Butch Penny with [facility] called for arrangements. Also pt has [employer] which qualifies him for a personal care service that would allow a nurses aide in the home for 2-6 hrs 5-7 days a week to assist him with bathing, cooking and light housekeeping but pt refused, stated " I don't want that." CM talked to patient about not taking his medication, he stated " I didn't like the way it made me feel, CM instructed him to talk to his physician before stopping any medication. CM will continue to follow for DCP.  Expected Discharge Date:     Possibly 10/11/2015             Expected Discharge Plan:  [place]  Discharge planning Services  CM Consult    Choice offered to:  Patient  HH Arranged:  RN, Disease Management [place] Agency:  [place]  Status of Service:  In process, will continue to follow  Sherrilyn Rist [id number] 10/07/2015, 10:06 AM

## 2015-10-07 NOTE — Plan of Care (Signed)
Problem: Food- and Nutrition-Related Knowledge Deficit (NB-1.1) Goal: Nutrition education Formal process to instruct or train a patient/client in a skill or to impart knowledge to help patients/clients voluntarily manage or modify food choices and eating behavior to maintain or improve health. Outcome: Completed/Met Date Met: 10/07/15  RD consulted for nutrition education regarding diabetes.   Lab Results  Component Value Date   HGBA1C 13.1 (H) 10/06/2015    RD provided "Carbohydrate Counting for People with Diabetes" handout from the Academy of Nutrition and Dietetics. Discussed different food groups and their effects on blood sugar, emphasizing carbohydrate-containing foods. Provided list of carbohydrates and recommended serving sizes of common foods.  Discussed importance of controlled and consistent carbohydrate intake throughout the day. Recommended ~75 grams of carbohydrates at meals. Provided examples of ways to balance meals/snacks and encouraged intake of high-fiber, whole grain complex carbohydrates. Discussed diabetic friendly drink options.Teach back method used.  Expect good compliance.  Body mass index is 31.51 kg/m. Pt meets criteria for class I obesity based on current BMI.  Current diet order is 2 gram NA/carbohydrate modified diet with 2L fluids, patient is consuming approximately 100% of meals at this time. Pt reports having a good appetite currently and PTA with usual consumption of at least 3 meals a day with snacks in between. Weight fluctuating due to fluids. Labs and medications reviewed. No further nutrition interventions warranted at this time. RD contact information provided. If additional nutrition issues arise, please re-consult RD.  Corrin Parker, MS, RD, LDN Pager # [phone] After hours/ weekend pager # [phone]

## 2015-10-07 NOTE — Progress Notes (Signed)
Inpatient Diabetes Program Recommendations  AACE/ADA: New Consensus Statement on Inpatient Glycemic Control (2015)  Target Ranges:  Prepandial:   less than 140 mg/dL      Peak postprandial:   less than 180 mg/dL (1-2 hours)      Critically ill patients:  140 - 180 mg/dL  Results for MARTISE, DEBNAM (MRN [id number]) as of 10/07/2015 12:10  Ref. Range 10/06/2015 16:46 10/06/2015 21:37 10/06/2015 22:48 10/07/2015 05:55  Glucose-Capillary Latest Ref Range: 65 - 99 mg/dL 320 (H) 232 (H) 308 (H) 116 (H)   Results for VEGAS, TRINGALI (MRN [id number]) as of 10/07/2015 12:10  Ref. Range 10/06/2015 15:08 10/06/2015 15:10 10/07/2015 03:55  Hemoglobin A1C Latest Ref Range: 4.8 - 5.6 %  13.1 (H)   Glucose Latest Ref Range: 65 - 99 mg/dL 316 (H)  111 (H)   Review of Glycemic Control  Diabetes history: DM2 Outpatient Diabetes medications: Lantus 10 units QHS, Novolog 5 units TID with meals Current orders for Inpatient glycemic control: Novolog 0-9 units TID with meals, Novolog 0-5 units QHS.  Inpatient Diabetes Program Recommendations:  Insulin - Basal: If glucose is elevated with Novolog correction scale, may want to consider ordering low dose basal insulin. HgbA1C: A1C 13.1% on 10/06/15 indicating and average glucose of 329 mg/dl over the past 2-3 months. Encourage patient to follow up with PCP regarding glycemic control. Diet: Ordered RD consult for further diet education on Carb Modified diet.  NOTE: Spoke with patient about diabetes and home regimen for diabetes control. Patient reports that he is followed by PCP for diabetes management. Patient is prescribed:  Lantus 10 units QHS and Novolog 5 units TID with mealsas an outpatient for diabetes control. Patient reports that he is NOT taking insulin as prescribed. Patient reports that over the past week he may have taken Lantus 1-2 times and Novolog 5 units 2 times. Inquired about why he hasn't taken as prescribed and he stated that if his "glucose was under 300 mg/dl or  so I don't take the insulin.  Discussed Lantus and Novolog and explained how they both work to keep diabetes controlled. Explained that if Lantus is taken and glucose is better the next day it is because the insulin is doing its job and needs to be taken again to keep glucose controlled. Patient reports that he saw his PCP in June and he reports that he was told his A1C was high but no changes were made with his insulin at his last office visit. Patient states that he checks his glucose 1 times per day (firs thing in the morning) and that his glucose is usually in the upper 200's -mid 300's mg/dl.  Discussed A1C results (13.1% on 10/06/15) and explained that his current A1C indicates an average glucose of 329 mg/dl over the past 2-3 months. Discussed glucose and A1C goals. Discussed importance of checking CBGs and maintaining good CBG control to prevent long-term and short-term complications. Explained how hyperglycemia leads to damage within blood vessels which lead to the common complications seen with uncontrolled diabetes. Stressed to the patient the importance of improving glycemic control to prevent further complications from uncontrolled diabetes. Discussed impact of nutrition, exercise, stress, sickness, and medications on diabetes control. Patient states that he tries to follow a diabetic diet but does admit that he snacks a lot during the day on cookies and snack cakes. Discussed carbohydrates, carbohydrate goals per day and meal, along with portion sizes. Informed patient I would order RD consult for further diet education  and for hand outs with sample meal plans. Encouraged patient to check his glucose 2-4 times per day, keep a log book of glucose readings, take insulin as prescribed, and take log book with him to follow up appointments. Stressed importance of improving diabetes control. Patient verbalized understanding of information discussed and he states that he has no further questions at this time  related to diabetes.  Thanks, Barnie Alderman, RN, MSN, CDE Diabetes Coordinator Inpatient Diabetes Program [phone] (Team Pager) [phone] ([facility] office) [phone] [facility] office) [phone] [facility] office)

## 2015-10-08 LAB — BASIC METABOLIC PANEL
Anion gap: 8 (ref 5–15)
BUN: 20 mg/dL (ref 6–20)
CO2: 32 mmol/L (ref 22–32)
Calcium: 8.7 mg/dL — ABNORMAL LOW (ref 8.9–10.3)
Chloride: 99 mmol/L — ABNORMAL LOW (ref 101–111)
Creatinine, Ser: 1.19 mg/dL (ref 0.61–1.24)
GFR calc Af Amer: >60 mL/min (ref >60)
GFR calc non Af Amer: >60 mL/min (ref >60)
Glucose, Bld: 262 mg/dL — ABNORMAL HIGH (ref 65–99)
Potassium: 3.7 mmol/L (ref 3.5–5.1)
Sodium: 139 mmol/L (ref 135–145)

## 2015-10-08 LAB — GLUCOSE, CAPILLARY
Glucose-Capillary: 383 mg/dL — ABNORMAL HIGH (ref 65–99)
Glucose-Capillary: 139 mg/dL — ABNORMAL HIGH (ref 65–99)
Glucose-Capillary: 178 mg/dL — ABNORMAL HIGH (ref 65–99)
Glucose-Capillary: 188 mg/dL — ABNORMAL HIGH (ref 65–99)

## 2015-10-08 MED ORDER — SACUBITRIL-VALSARTAN 49-51 MG PO TABS
1.0000 | ORAL_TABLET | Freq: Two times a day (BID) | ORAL | Status: DC
  Administered 2015-10-08: 1 via ORAL
  Filled 2015-10-08 (×3): qty 1

## 2015-10-08 MED ORDER — FUROSEMIDE 80 MG PO TABS
80.0000 mg | ORAL_TABLET | Freq: Every day | ORAL | Status: DC
  Administered 2015-10-09 – 2015-10-10 (×2): 80 mg via ORAL
  Filled 2015-10-08 (×2): qty 1

## 2015-10-08 NOTE — Progress Notes (Signed)
No apparent distress noted >no complaint presented.Resting in bed

## 2015-10-08 NOTE — Progress Notes (Addendum)
Advanced Heart Failure Rounding Note   Subjective:     Confused last night. Now better.   Diuresing well. Denies dyspnea. Weight way down today. Not sure if accurate. Urine getting dark. Renal function stable. Entresto added yesterday     Objective:   Weight Range:  Vital Signs:   Temp:  [97.7 F (36.5 C)-98.6 F (37 C)] 98.6 F (37 C) (08/05 0430) Pulse Rate:  [76-86] 76 (08/05 0430) Resp:  [18] 18 (08/05 0430) BP: (134-152)/(91-114) 134/91 (08/05 0430) SpO2:  [94 %-100 %] 94 % (08/05 0430) Weight:  [99.3 kg (219 lb)] 99.3 kg (219 lb) (08/05 0430) Last BM Date: 10/07/15  Weight change: Filed Weights   10/06/15 1530 10/07/15 0646 10/08/15 0430  Weight: 107.3 kg (236 lb 8.9 oz) 105.4 kg (232 lb 4.8 oz) 99.3 kg (219 lb)    Intake/Output:   Intake/Output Summary (Last 24 hours) at 10/08/15 0939 Last data filed at 10/08/15 0934  Gross per 24 hour  Intake             2510 ml  Output             4926 ml  Net            -2416 ml     Physical Exam: General:  Well appearing. No resp difficulty. In bed.  HEENT: normal Neck: supple. JVP flat  Carotids 2+ bilat; no bruits. No lymphadenopathy or thryomegaly appreciated. Cor: PMI nondisplaced. Regular rate & rhythm. No rubs, gallops or murmurs. Lungs: clear Abdomen: soft, nontender, nondistended. No hepatosplenomegaly. No bruits or masses. Good bowel sounds. Extremities: no cyanosis, clubbing, rash, R BKA wound. LLE trace edema.  Neuro: alert & orientedx3, cranial nerves grossly intact. moves all 4 extremities w/o difficulty. Affect pleasant  Telemetry: NSR 70-80s   Labs: Basic Metabolic Panel:  Recent Labs Lab 10/06/15 1508 10/07/15 0355 10/08/15 0335  NA 139 140 139  K 3.2* 3.5 3.7  CL 104 102 99*  CO2 26 31 32  GLUCOSE 316* 111* 262*  BUN 17 19 20   CREATININE 1.11 1.11 1.19  CALCIUM 7.8* 8.7* 8.7*  MG 1.5* 1.5*  --     Liver Function Tests:  Recent Labs Lab 10/06/15 1508  AST 20  ALT 23    ALKPHOS 124  BILITOT 1.1  PROT 5.7*  ALBUMIN 2.5*   No results for input(s): LIPASE, AMYLASE in the last 168 hours. No results for input(s): AMMONIA in the last 168 hours.  CBC:  Recent Labs Lab 10/06/15 1508  WBC 7.6  NEUTROABS 5.9  HGB 12.5*  HCT 40.6  MCV 78.7  PLT 263    Cardiac Enzymes: No results for input(s): CKTOTAL, CKMB, CKMBINDEX, TROPONINI in the last 168 hours.  BNP: BNP (last 3 results)  Recent Labs  07/06/15 1056 07/08/15 1825 10/06/15 1509  BNP 1,617.0* 1,764.5* 1,672.8*    ProBNP (last 3 results) No results for input(s): PROBNP in the last 8760 hours.    Other results:  Imaging: Dg Chest Port 1 View  Result Date: 10/07/2015 CLINICAL DATA:  Dyspnea. EXAM: PORTABLE CHEST 1 VIEW COMPARISON:  Radiographs of Jul 08, 2015. FINDINGS: Stable cardiomegaly. No pneumothorax or pleural effusion is noted. Bony thorax is unremarkable. No acute pulmonary disease is noted. IMPRESSION: No acute cardiopulmonary abnormality seen. Electronically Signed   By: Marijo Conception, M.D.   On: 10/07/2015 07:29     Medications:     Scheduled Medications: . atorvastatin  40 mg Oral q1800  .  enoxaparin (LOVENOX) injection  40 mg Subcutaneous Q24H  . furosemide  80 mg Intravenous BID  . insulin aspart  0-5 Units Subcutaneous QHS  . insulin aspart  0-9 Units Subcutaneous TID WC  . potassium chloride  40 mEq Oral BID  . sacubitril-valsartan  1 tablet Oral BID  . sodium chloride flush  3 mL Intravenous Q12H    Infusions:    PRN Medications: sodium chloride, acetaminophen, ondansetron (ZOFRAN) IV, sodium chloride flush   Assessment:  1. Acute/Chronic systolic HF due to NICM. ECHO 07/2015 EF 15%. NYHA III-IV. Volume status elevated in the setting of medication noncompliance and increased fluid intake. Has not been taking medications in weeks. BNP on admit 1672.  Now looks euvolemic. Will switch to po diuretics.  Increase entresto 59-51mg  twice a day.  Would not  use spiro or dig due to noncompliance.   Not going to add bidil for now because I doubt he will take 3 times a day.  2. HTN- Elevated. Increasing  entresto as above.  3. DM2, poorly controlled- Add sliding scale . Consult diabetes coordinator.  4. PAD s/p BKA 5. CKD stage 3 - Renal function stable today.  6. R BKA wound- Consult WOC  Volume status looks good. Switch to po diuretics. We have ordered [facility] for d/c. On d/c will need to narrow his medicines down to just 3 or 4 of most important meds to facilitate compliance. Possible d/c tomorrow or Monday. Will ask CM to see to help obtain Entresto.   Will hevt PT see to assess need for SNF.   Length of Stay: 2  Glori Bickers MD 10/08/2015, 9:39 AM  Advanced Heart Failure Team Pager [phone] (M-F; 7a - 4p)  Please contact [facility] for night-coverage after hours (4p -7a ) and weekends on [email or web address]

## 2015-10-09 DIAGNOSIS — R809 Proteinuria, unspecified: Secondary | ICD-10-CM

## 2015-10-09 DIAGNOSIS — E1165 Type 2 diabetes mellitus with hyperglycemia: Secondary | ICD-10-CM

## 2015-10-09 LAB — BASIC METABOLIC PANEL
Anion gap: 11 (ref 5–15)
BUN: 15 mg/dL (ref 6–20)
CO2: 34 mmol/L — ABNORMAL HIGH (ref 22–32)
Calcium: 8.8 mg/dL — ABNORMAL LOW (ref 8.9–10.3)
Chloride: 95 mmol/L — ABNORMAL LOW (ref 101–111)
Creatinine, Ser: 1.12 mg/dL (ref 0.61–1.24)
GFR calc Af Amer: >60 mL/min (ref >60)
GFR calc non Af Amer: >60 mL/min (ref >60)
Glucose, Bld: 166 mg/dL — ABNORMAL HIGH (ref 65–99)
Potassium: 3.9 mmol/L (ref 3.5–5.1)
Sodium: 140 mmol/L (ref 135–145)

## 2015-10-09 LAB — GLUCOSE, CAPILLARY
Glucose-Capillary: 175 mg/dL — ABNORMAL HIGH (ref 65–99)
Glucose-Capillary: 188 mg/dL — ABNORMAL HIGH (ref 65–99)
Glucose-Capillary: 262 mg/dL — ABNORMAL HIGH (ref 65–99)
Glucose-Capillary: 126 mg/dL — ABNORMAL HIGH (ref 65–99)

## 2015-10-09 MED ORDER — INSULIN GLARGINE 100 UNIT/ML ~~LOC~~ SOLN
5.0000 [IU] | Freq: Every day | SUBCUTANEOUS | Status: DC
  Administered 2015-10-09 – 2015-10-10 (×2): 5 [IU] via SUBCUTANEOUS
  Filled 2015-10-09 (×2): qty 0.05

## 2015-10-09 MED ORDER — SACUBITRIL-VALSARTAN 97-103 MG PO TABS
1.0000 | ORAL_TABLET | Freq: Two times a day (BID) | ORAL | Status: DC
  Administered 2015-10-09 – 2015-10-10 (×3): 1 via ORAL
  Filled 2015-10-09 (×3): qty 1

## 2015-10-09 NOTE — Progress Notes (Signed)
Advanced Heart Failure Rounding Note   Subjective:     Confused at times per nurse tech.   Switch to po lasix yesterday.  Continues to diurese very well. On Entresto. Weight down 29 pounds by report. BP still high. Denies dyspnea. Renal functions table.    Objective:   Weight Range:  Vital Signs:   Temp:  [97.6 F (36.4 C)-98.1 F (36.7 C)] 98 F (36.7 C) (08/06 0522) Pulse Rate:  [75-95] 80 (08/06 0522) Resp:  [18-20] 18 (08/06 0522) BP: (125-148)/(96-98) 148/98 (08/06 0522) SpO2:  [93 %-97 %] 97 % (08/06 0522) Weight:  [94.1 kg (207 lb 8 oz)] 94.1 kg (207 lb 8 oz) (08/06 0522) Last BM Date: 10/08/15  Weight change: Filed Weights   10/07/15 0646 10/08/15 0430 10/09/15 0522  Weight: 105.4 kg (232 lb 4.8 oz) 99.3 kg (219 lb) 94.1 kg (207 lb 8 oz)    Intake/Output:   Intake/Output Summary (Last 24 hours) at 10/09/15 0840 Last data filed at 10/09/15 0700  Gross per 24 hour  Intake              840 ml  Output             5775 ml  Net            -4935 ml     Physical Exam: General:  Well appearing. No resp difficulty. In bed.  HEENT: normal Neck: supple. JVP flat  Carotids 2+ bilat; no bruits. No lymphadenopathy or thryomegaly appreciated. Cor: PMI nondisplaced. Regular rate & rhythm. No rubs, gallops or murmurs. Lungs: clear Abdomen: soft, nontender, nondistended. No hepatosplenomegaly. No bruits or masses. Good bowel sounds. Extremities: no cyanosis, clubbing, rash, R BKA wound. LLE trace edema.  Neuro: alert & orientedx3, cranial nerves grossly intact. moves all 4 extremities w/o difficulty. Affect pleasant  Telemetry: NSR 80s   Labs: Basic Metabolic Panel:  Recent Labs Lab 10/06/15 1508 10/07/15 0355 10/08/15 0335 10/09/15 0359  NA 139 140 139 140  K 3.2* 3.5 3.7 3.9  CL 104 102 99* 95*  CO2 26 31 32 34*  GLUCOSE 316* 111* 262* 166*  BUN 17 19 20 15   CREATININE 1.11 1.11 1.19 1.12  CALCIUM 7.8* 8.7* 8.7* 8.8*  MG 1.5* 1.5*  --   --      Liver Function Tests:  Recent Labs Lab 10/06/15 1508  AST 20  ALT 23  ALKPHOS 124  BILITOT 1.1  PROT 5.7*  ALBUMIN 2.5*   No results for input(s): LIPASE, AMYLASE in the last 168 hours. No results for input(s): AMMONIA in the last 168 hours.  CBC:  Recent Labs Lab 10/06/15 1508  WBC 7.6  NEUTROABS 5.9  HGB 12.5*  HCT 40.6  MCV 78.7  PLT 263    Cardiac Enzymes: No results for input(s): CKTOTAL, CKMB, CKMBINDEX, TROPONINI in the last 168 hours.  BNP: BNP (last 3 results)  Recent Labs  07/06/15 1056 07/08/15 1825 10/06/15 1509  BNP 1,617.0* 1,764.5* 1,672.8*    ProBNP (last 3 results) No results for input(s): PROBNP in the last 8760 hours.    Other results:  Imaging: No results found.   Medications:     Scheduled Medications: . atorvastatin  40 mg Oral q1800  . enoxaparin (LOVENOX) injection  40 mg Subcutaneous Q24H  . furosemide  80 mg Oral Daily  . insulin aspart  0-5 Units Subcutaneous QHS  . insulin aspart  0-9 Units Subcutaneous TID WC  . potassium chloride  40 mEq Oral BID  . sacubitril-valsartan  1 tablet Oral BID  . sodium chloride flush  3 mL Intravenous Q12H    Infusions:    PRN Medications: sodium chloride, acetaminophen, ondansetron (ZOFRAN) IV, sodium chloride flush   Assessment:   1. Acute/Chronic systolic HF due to NICM. ECHO 07/2015 EF 15%. --admitted with NYHA IV symptoms. Volume status elevated in the setting of medication noncompliance and increased fluid intake. Has not been taking medications in weeks. BNP on admit 1672.  -- Now looks euvolemic. Continue to diurese well on po diuretics.  --Increase entresto 97-103 mg twice a day.  --Would not use spiro or dig due to noncompliance.   --Not going to add bidil for now because I doubt he will take 3 times a day.  2. HTN- Elevated. Increasing  entresto as above.  3. DM2, poorly controlled- HGBa1c 13.1% Seen by diabetes coordinator. I have started lantus 5 4. PAD s/p  BKA 5. CKD stage 3 - Renal function stable today.  6. R BKA wound- Consult WOC  Volume status looks good on po diuretics and Entresto. Will watch one more day to make sure weight stable on po diuretics and not overdiuresed. Will also have PT and CM see. HF Navigator to see in am to help coordinate transition to outpatient.   On d/c will need to narrow his medicines down to just 3 or 4 of most important meds to facilitate compliance. Will ask CM to see to help obtain Entresto.   Length of Stay: 3  Bensimhon, Daniel MD 10/09/2015, 8:40 AM  Advanced Heart Failure Team Pager [phone] (M-F; [place])  Please contact [facility] for night-coverage after hours (4p -7a ) and weekends on [email or web address]

## 2015-10-09 NOTE — Progress Notes (Signed)
CM met with pt in room and gave pt free 30 day trial card for [facility].  Pt verbalized understanding this card will pay for his discharge prescription and give insurance enough time to authorize for refills.  No other CM needs were communicated.

## 2015-10-09 NOTE — Evaluation (Signed)
Physical Therapy Evaluation Patient Details Name: [name] MRN: [id number] DOB: [birth year] Today's Date: 10/09/2015   History of Present Illness   60 yo M with PMHx HTN, DM, :PAD S/p right BKA with non-haling wound, admitted for acute HF in 5/17// Echo on 07/10/15 showed EF of 15% admitted with acute HF in setting of medication noncompliance.    Clinical Impression  Pt presents at/near functional baseline, uses RW safely for short distance ambulation without prosthesis. No acute PT needs, however pt at risk for decline based on history of noncompliance and poor insight into situation.  Unfortunately his insurance will not provide easy access to rehab at d/c.  Recommend CSW to assist with maximizing access to community resources and support.  No further recommendations at this time.     Follow Up Recommendations No PT follow up    Equipment Recommendations  None recommended by PT    Recommendations for Other Services       Precautions / Restrictions Precautions Precautions: Fall Precaution Comments: bka, use RW, can ambulate to bathroom Restrictions RLE Weight Bearing: Non weight bearing      Mobility  Bed Mobility Overal bed mobility: Modified Independent                Transfers Overall transfer level: Modified independent Equipment used: Rolling walker (2 wheeled)                Ambulation/Gait Ambulation/Gait assistance: Modified independent (Device/Increase time) Ambulation Distance (Feet): 50 Feet Assistive device: Rolling walker (2 wheeled) Gait Pattern/deviations:  (single leg)     General Gait Details: uses RW, steady with no indication of balance or strenght limitations  Stairs            Wheelchair Mobility    Modified Rankin (Stroke Patients Only)       Balance Overall balance assessment: Modified Independent                                           Pertinent Vitals/Pain Pain Assessment: No/denies pain     Home Living Family/patient expects to be discharged to:: Private residence Living Arrangements: Alone Available Help at Discharge: Family;Available PRN/intermittently Type of Home: Apartment Home Access: Level entry     Home Layout: One level Home Equipment: Walker - 2 wheels;Bedside commode;Wheelchair - Sport and exercise psychologist Comments: sister helps drive him to appointments and to the grocery store    Prior Function Level of Independence: Independent with assistive device(s)         Comments: pressure ulcer ant knee prevents use of prosthesis     Hand Dominance   Dominant Hand: Right    Extremity/Trunk Assessment   Upper Extremity Assessment: Overall WFL for tasks assessed           Lower Extremity Assessment: Overall WFL for tasks assessed;RLE deficits/detail RLE Deficits / Details: BKA, ant knee ulcer, unable to wear prosthesis    Cervical / Trunk Assessment: Normal  Communication   Communication: No difficulties  Cognition Arousal/Alertness: Awake/alert Behavior During Therapy: WFL for tasks assessed/performed Overall Cognitive Status: Within Functional Limits for tasks assessed (unsure if pt has pre-existing mild cog impairments)                      General Comments General comments (skin integrity, edema, etc.): as noted, ulcer to R ant knee    Exercises  Assessment/Plan    PT Assessment Patent does not need any further PT services  PT Diagnosis     PT Problem List    PT Treatment Interventions     PT Goals (Current goals can be found in the Care Plan section) Acute Rehab PT Goals Patient Stated Goal: home PT Goal Formulation: All assessment and education complete, DC therapy    Frequency     Barriers to discharge        Co-evaluation               End of Session Equipment Utilized During Treatment: Gait belt Activity Tolerance: Patient tolerated treatment well Patient left: in bed;with call bell/phone  within reach Nurse Communication: Mobility status         Time: [id number] PT Time Calculation (min) (ACUTE ONLY): 18 min   Charges:   PT Evaluation $PT Eval Moderate Complexity: 1 Procedure     PT G Codes:        [name] 10/09/2015, 3:08 PM

## 2015-10-10 DIAGNOSIS — Z9111 Patient's noncompliance with dietary regimen: Secondary | ICD-10-CM

## 2015-10-10 DIAGNOSIS — E1159 Type 2 diabetes mellitus with other circulatory complications: Secondary | ICD-10-CM

## 2015-10-10 LAB — BASIC METABOLIC PANEL
Anion gap: 4 — ABNORMAL LOW (ref 5–15)
BUN: 11 mg/dL (ref 6–20)
CO2: 35 mmol/L — ABNORMAL HIGH (ref 22–32)
Calcium: 8.6 mg/dL — ABNORMAL LOW (ref 8.9–10.3)
Chloride: 97 mmol/L — ABNORMAL LOW (ref 101–111)
Creatinine, Ser: 1.06 mg/dL (ref 0.61–1.24)
GFR calc Af Amer: >60 mL/min (ref >60)
GFR calc non Af Amer: >60 mL/min (ref >60)
Glucose, Bld: 116 mg/dL — ABNORMAL HIGH (ref 65–99)
Potassium: 4 mmol/L (ref 3.5–5.1)
Sodium: 136 mmol/L (ref 135–145)

## 2015-10-10 LAB — GLUCOSE, CAPILLARY
Glucose-Capillary: 123 mg/dL — ABNORMAL HIGH (ref 65–99)
Glucose-Capillary: 233 mg/dL — ABNORMAL HIGH (ref 65–99)

## 2015-10-10 MED ORDER — FUROSEMIDE 40 MG PO TABS: 80.0000 mg | ORAL_TABLET | Freq: Every day | ORAL | 6 refills | Status: DC

## 2015-10-10 MED ORDER — SACUBITRIL-VALSARTAN 97-103 MG PO TABS: 1.0000 | ORAL_TABLET | Freq: Two times a day (BID) | ORAL | 6 refills | Status: DC

## 2015-10-10 MED ORDER — METFORMIN HCL 500 MG PO TABS: 500.0000 mg | ORAL_TABLET | Freq: Two times a day (BID) | ORAL | 6 refills | Status: DC

## 2015-10-10 MED ORDER — POTASSIUM CHLORIDE CRYS ER 20 MEQ PO TBCR: 40.0000 meq | EXTENDED_RELEASE_TABLET | Freq: Every day | ORAL | 6 refills | Status: DC

## 2015-10-10 MED ORDER — INSULIN GLARGINE 100 UNIT/ML ~~LOC~~ SOLN: 5.0000 [IU] | Freq: Every morning | SUBCUTANEOUS | 3 refills | Status: DC

## 2015-10-10 NOTE — Discharge Summary (Signed)
Advanced Heart Failure Team  Discharge Summary   Patient ID: [name] MRN: [id number], DOB/AGE: [birth year] 59 y.o. Admit date: 10/06/2015 D/C date:     10/10/2015   Primary Discharge Diagnoses:  1. A/C Systolic Heart Failure- NICM ECHO 07/2015 EF 15%.  2. HTN 3. Uncontrolled DMII- Hgb A1C 13% 4. PAD H/O R BKA 5. CKD Stage III 6. R BKA wound  Hospital Course:  [name] is a 60 year old male with a past medical history of HTN, DM, :PAD S/p right BKA with non-haling wound, admitted for acute HF in 5/17// Echo on 07/10/15 showed EF of 15%, diffuse hypokinesis, PA pressure elevated at 40mmHg.   Admitted from the HF clinic with marked volume overload in the setting of medication noncompliance. He had not taken any medications or insulin in several weeks. He was diuresed with  IV lasix and transitioned to 80 mg po lasix daily. Over all he diuresed 36 pounds. Medication regimen has been simplified. He will not be placed on dig, spiro, or bidil due to noncompliance.  He will be followed by [facility] and Paramedicine. He is at high risk for readmits due to poor insight.   1. Acute/Chronic systolic HF due to NICM. ECHO 07/2015 EF 15%. --admitted with NYHA IV symptoms. Volume status elevated in the setting of medication noncompliance and increased fluid intake. Has not been taking medications in weeks. BNP on admit 1672.  Diuresed with IV lasix and transitioned to lasix 80 mg daily.  Resume carvedilol 3.125 mg twice a day.  --Continue entresto 97-103 mg twice a day.  --Would not use spiro or dig due to noncompliance.   --Not going to add bidil for now because I doubt he will take 3 times a day.  2. HTN- Improved through out hospitalization. Follow as an outpatient.    3. DM2, poorly controlled- HGBa1c 13.1%.  Seen by diabetes coordinator. Was not taking any insulin for several weeks. Says he is going to try and take insulin but doesn't like to take it. For this reason I am going to add metformin 500 mg  twice a day. Plan to continue  lantus at a reduced dose of 5 units daily. On the reduced dose his glucose has been much more controlled however I am not convinced he will take insulin. Needs follow up PCP.   4. PAD s/p BKA 5. CKD stage 3 - Followed closely and remained stable throughout his hospitalization. .    6. R BKA wound - [facility] to address with wound care protocol.    Discharge Weight: 200 pounds.  Discharge Vitals: Blood pressure (!) 150/99, pulse 85, temperature 97.8 F (36.6 C), temperature source Oral, resp. rate 18, height 6' (1.829 m), weight 200 lb 6.4 oz (90.9 kg), SpO2 95 %.  Labs: Lab Results  Component Value Date   WBC 7.6 10/06/2015   HGB 12.5 (L) 10/06/2015   HCT 40.6 10/06/2015   MCV 78.7 10/06/2015   PLT 263 10/06/2015    Recent Labs Lab 10/06/15 1508  10/10/15 0327  NA 139  < > 136  K 3.2*  < > 4.0  CL 104  < > 97*  CO2 26  < > 35*  BUN 17  < > 11  CREATININE 1.11  < > 1.06  CALCIUM 7.8*  < > 8.6*  PROT 5.7*  --   --   BILITOT 1.1  --   --   ALKPHOS 124  --   --   ALT  23  --   --   AST 20  --   --   GLUCOSE 316*  < > 116*  < > = values in this interval not displayed. Lab Results  Component Value Date   CHOL 96 07/10/2015   HDL 32 (L) 07/10/2015   LDLCALC 55 07/10/2015   TRIG 47 07/10/2015   BNP (last 3 results)  Recent Labs  07/06/15 1056 07/08/15 1825 10/06/15 1509  BNP 1,617.0* 1,764.5* 1,672.8*    ProBNP (last 3 results) No results for input(s): PROBNP in the last 8760 hours.   Diagnostic Studies/Procedures   No results found.  Discharge Medications     Medication List    STOP taking these medications   BIDIL 20-37.5 MG tablet Generic drug:  isosorbide-hydrALAZINE   NOVOLOG 100 UNIT/ML injection Generic drug:  insulin aspart   VISINE OP     TAKE these medications   atorvastatin 40 MG tablet Commonly known as:  LIPITOR Take 1 tablet (40 mg total) by mouth daily.   brimonidine 0.2 % ophthalmic solution Commonly  known as:  ALPHAGAN PLACE 1 DROP INTO THE LEFT EYE 2 (TWO) TIMES DAILY.   carvedilol 3.125 MG tablet Commonly known as:  COREG Take 3.125 mg by mouth 2 (two) times daily with a meal.   dorzolamide-timolol 22.3-6.8 MG/ML ophthalmic solution Commonly known as:  COSOPT Place 1 drop into the left eye 2 (two) times daily.   furosemide 40 MG tablet Commonly known as:  LASIX Take 2 tablets (80 mg total) by mouth daily. What changed:  how much to take  when to take this   insulin glargine 100 UNIT/ML injection Commonly known as:  LANTUS Inject 0.05 mLs (5 Units total) into the skin every morning. What changed:  how much to take   metFORMIN 500 MG tablet Commonly known as:  GLUCOPHAGE Take 1 tablet (500 mg total) by mouth 2 (two) times daily with a meal.   potassium chloride SA 20 MEQ tablet Commonly known as:  K-DUR,KLOR-CON Take 2 tablets (40 mEq total) by mouth daily.   sacubitril-valsartan 97-103 MG Commonly known as:  ENTRESTO Take 1 tablet by mouth 2 (two) times daily.   vitamin C 500 MG tablet Commonly known as:  ASCORBIC ACID Take 500 mg by mouth daily.       Disposition   The patient will be discharged in stable condition to home. Discharge Instructions    (HEART FAILURE PATIENTS) Call MD:  Anytime you have any of the following symptoms: 1) 3 pound weight gain in 24 hours or 5 pounds in 1 week 2) shortness of breath, with or without a dry hacking cough 3) swelling in the hands, feet or stomach 4) if you have to sleep on extra pillows at night in order to breathe.    Complete by:  As directed   Diet - low sodium heart healthy    Complete by:  As directed   Heart Failure patients record your daily weight using the same scale at the same time of day    Complete by:  As directed   Increase activity slowly    Complete by:  As directed     Follow-up Information    [facility] .   Why:  They will do your home health care at your home Contact  information: [address] [place] [address] [phone]        Amy Clegg, NP Follow up on 10/19/2015.   Specialty:  Cardiology Why:  Heart Failure Clinic on the first floor at [facility] at [place] information: [address] [address] [phone]        Steve Rattler, DO. Schedule an appointment as soon as possible for a visit on 10/10/2015.   Contact information: [place] [address] [phone]             Duration of Discharge Encounter: Greater than 35 minutes   Signed, Amy Clegg NP-C  10/10/2015, 12:19 PM  Patient seen and examined with Darrick Grinder, NP. We discussed all aspects of the encounter. I agree with the assessment and plan as stated above.   He is much improved. Volume status looks very good. He lives alone and has been very noncompliant with HF and DM2 meds. We did our best to simplify his regimen and stressed need to take meds as best as possible. We will continue Paramedicine and HHRN.  Bensimhon, Daniel,MD 10:35 PM

## 2015-10-10 NOTE — Progress Notes (Signed)
Pt has orders to be discharged. Discharge instructions given and pt has no additional questions at this time. Medication regimen reviewed and pt educated. Pt verbalized understanding and has no additional questions. Telemetry box removed. IV removed and site in good condition. Pt stable and waiting for transportation.  Jessica Elmore RN 

## 2015-10-10 NOTE — Progress Notes (Signed)
Advanced Heart Failure Rounding Note   Subjective:    Feeling better wants to go home. Denies dyspnea.   Objective:   Weight Range:  Vital Signs:   Temp:  [97.8 F (36.6 C)-98.1 F (36.7 C)] 97.8 F (36.6 C) (08/07 0450) Pulse Rate:  [81-85] 85 (08/07 0450) Resp:  [18-20] 18 (08/07 0450) BP: (127-150)/(92-99) 150/99 (08/07 0450) SpO2:  [92 %-95 %] 95 % (08/07 0450) Weight:  [200 lb 6.4 oz (90.9 kg)] 200 lb 6.4 oz (90.9 kg) (08/07 0450) Last BM Date: 10/10/15  Weight change: Filed Weights   10/08/15 0430 10/09/15 0522 10/10/15 0450  Weight: 219 lb (99.3 kg) 207 lb 8 oz (94.1 kg) 200 lb 6.4 oz (90.9 kg)    Intake/Output:   Intake/Output Summary (Last 24 hours) at 10/10/15 1008 Last data filed at 10/10/15 0942  Gross per 24 hour  Intake              720 ml  Output             2375 ml  Net            -1655 ml     Physical Exam: General:  Well appearing. No resp difficulty. In the chair  HEENT: normal Neck: supple. JVP flat  Carotids 2+ bilat; no bruits. No lymphadenopathy or thryomegaly appreciated. Cor: PMI nondisplaced. Regular rate & rhythm. No rubs, gallops or murmurs. Lungs: clear  Abdomen: soft, nontender, nondistended. No hepatosplenomegaly. No bruits or masses. Good bowel sounds. Extremities: no cyanosis, clubbing, rash, R BKA wound. LLE trace edema.  Neuro: alert & orientedx3, cranial nerves grossly intact. moves all 4 extremities w/o difficulty. Affect pleasant  Telemetry: NSR 90s   Labs: Basic Metabolic Panel:  Recent Labs Lab 10/06/15 1508 10/07/15 0355 10/08/15 0335 10/09/15 0359 10/10/15 0327  NA 139 140 139 140 136  K 3.2* 3.5 3.7 3.9 4.0  CL 104 102 99* 95* 97*  CO2 26 31 32 34* 35*  GLUCOSE 316* 111* 262* 166* 116*  BUN 17 19 20 15 11   CREATININE 1.11 1.11 1.19 1.12 1.06  CALCIUM 7.8* 8.7* 8.7* 8.8* 8.6*  MG 1.5* 1.5*  --   --   --     Liver Function Tests:  Recent Labs Lab 10/06/15 1508  AST 20  ALT 23  ALKPHOS 124    BILITOT 1.1  PROT 5.7*  ALBUMIN 2.5*   No results for input(s): LIPASE, AMYLASE in the last 168 hours. No results for input(s): AMMONIA in the last 168 hours.  CBC:  Recent Labs Lab 10/06/15 1508  WBC 7.6  NEUTROABS 5.9  HGB 12.5*  HCT 40.6  MCV 78.7  PLT 263    Cardiac Enzymes: No results for input(s): CKTOTAL, CKMB, CKMBINDEX, TROPONINI in the last 168 hours.  BNP: BNP (last 3 results)  Recent Labs  07/06/15 1056 07/08/15 1825 10/06/15 1509  BNP 1,617.0* 1,764.5* 1,672.8*    ProBNP (last 3 results) No results for input(s): PROBNP in the last 8760 hours.    Other results:  Imaging: No results found.   Medications:     Scheduled Medications: . atorvastatin  40 mg Oral q1800  . enoxaparin (LOVENOX) injection  40 mg Subcutaneous Q24H  . furosemide  80 mg Oral Daily  . insulin aspart  0-5 Units Subcutaneous QHS  . insulin aspart  0-9 Units Subcutaneous TID WC  . insulin glargine  5 Units Subcutaneous Daily  . potassium chloride  40 mEq Oral BID  .  sacubitril-valsartan  1 tablet Oral BID  . sodium chloride flush  3 mL Intravenous Q12H    Infusions:    PRN Medications: sodium chloride, acetaminophen, ondansetron (ZOFRAN) IV, sodium chloride flush   Assessment:   1. Acute/Chronic systolic HF due to NICM. ECHO 07/2015 EF 15%. --admitted with NYHA IV symptoms. Volume status elevated in the setting of medication noncompliance and increased fluid intake. Has not been taking medications in weeks. BNP on admit 1672.  --Volume status stable. Continue lasix 80 mg daily.  --Continue entresto 97-103 mg twice a day.  --Would not use spiro or dig due to noncompliance.   --Not going to add bidil for now because I doubt he will take 3 times a day.  2. HTN- Add 2.5 mg amlodipine daily.   3. DM2, poorly controlled- HGBa1c 13.1% Seen by diabetes coordinator. Was not taking any insulin for several weeks. Says he is going to try and take insulin but doesn't like to  take it.  For this reason I am going to add metformin 500 mg twice a day. Need follow up PCP.   4. PAD s/p BKA 5. CKD stage 3 - Renal function stable.   6. R BKA wound-   Home with [facility]. He will also be followed by HF Paramedicine.  Length of Stay: [place] NP-C  10/10/2015, 10:08 AM  Advanced Heart Failure Team Pager [phone] (M-F; 7a - 4p)  Please contact [facility] for night-coverage after hours (4p -7a ) and weekends on [email or web address]  Patient seen and examined with Darrick Grinder, NP. We discussed all aspects of the encounter. I agree with the assessment and plan as stated above.   He is much improved. Volume status looks very good. He lives alone and has been very noncompliant with HF and DM2 meds. We did our best to simplify his regimen and stressed need to take meds as best as possible. We will continue Paramedicine and HHRN.  Bensimhon, Daniel,MD 10:35 PM

## 2015-10-12 ENCOUNTER — Telehealth (HOSPITAL_COMMUNITY): Payer: Self-pay | Admitting: Cardiology

## 2015-10-12 NOTE — Telephone Encounter (Signed)
[name] with para medicine called to report patient is only taking entresto post discharge/ bp 146/110 weight 209.8 lb with prosthetic. Patient did c/o cough. [name] did fill pill box and advise to begin all medications as prescribed by providers

## 2015-10-17 ENCOUNTER — Encounter: Payer: Self-pay | Admitting: Family Medicine

## 2015-10-17 ENCOUNTER — Telehealth: Payer: Self-pay | Admitting: Licensed Clinical Social Worker

## 2015-10-17 ENCOUNTER — Ambulatory Visit (INDEPENDENT_AMBULATORY_CARE_PROVIDER_SITE_OTHER): Payer: Medicaid Other | Admitting: Family Medicine

## 2015-10-17 VITALS — BP 155/109 | HR 84 | Temp 97.5°F | Wt 208.0 lb

## 2015-10-17 DIAGNOSIS — Z794 Long term (current) use of insulin: Secondary | ICD-10-CM

## 2015-10-17 DIAGNOSIS — L8990 Pressure ulcer of unspecified site, unspecified stage: Secondary | ICD-10-CM | POA: Diagnosis not present

## 2015-10-17 DIAGNOSIS — E1142 Type 2 diabetes mellitus with diabetic polyneuropathy: Secondary | ICD-10-CM | POA: Diagnosis not present

## 2015-10-17 DIAGNOSIS — I5042 Chronic combined systolic (congestive) and diastolic (congestive) heart failure: Secondary | ICD-10-CM | POA: Diagnosis present

## 2015-10-17 DIAGNOSIS — IMO0002 Reserved for concepts with insufficient information to code with codable children: Secondary | ICD-10-CM

## 2015-10-17 DIAGNOSIS — E1165 Type 2 diabetes mellitus with hyperglycemia: Secondary | ICD-10-CM | POA: Diagnosis not present

## 2015-10-17 NOTE — Assessment & Plan Note (Addendum)
  Without signs of infection, likely colonized, non-healing likely due to pressure from prosthetic use daily and history of walking on knees  -no need for antibiotics at this time -advised him to keep all pressure off of it to aid in healing otherwise this is unlikely to heal -contacting [facility] to see what happened with wound care at home as no one has seen him, will follow up and re-refer if needed -applied dressing before he put prosthetic back on -advised him to cover wound before allowing it to come into contact with ground/prosthesis  -continue washing with soap and water -will follow up in 1 month

## 2015-10-17 NOTE — Progress Notes (Signed)
Subjective:    Patient ID: RAYMIR BENDIK, male    DOB: 11-Aug-1955, 60 y.o.   MRN: [id number]   Mr. Zeidman is here today for hospital follow up. He was discharged on 10/06/15 after CHF exacerbation and 36 pound diuresis.   CHF He has been doing well since discharge and has been taking his medications that a pharmacy aid sets up for him every Wednesday. Weight today is 208 with prosthetic on. He feels well. Feels the swelling has gone down a lot and has stayed down. He denies shortness of breath, chest pain, edema. He does report orthopnea, has been sleeping with 2 pillows for the past several months. Has not needed more elevation lately. He does have a persistent cough but it has not worsened since discharge. It is non-productive.  T2DM He is taking Lantus 10 units every morning. Checks his blood glucose fasting every morning and it runs in the 200's. He eats 3 meals a day, does not get a lot of vegetables, only likes green beans. Eats a lot of rice, potatoes, grits, bread.   Wound right shin He is concerned about his non-healing wound on his right upper shin that has been present for months. Home health has not come to see him. He was told by Dr. Sharol Given, ortho, to keep off of it. He has been trying to keep off of it because he now has a walker and does not need to crawl on his knees to get around. He does use his prothesis every day but tries to limit it to an hour or less and only wears it to take out trash/get mail. He does not cover it. He washes it in shower with soap and water. Denies fevers or chills nausea and vomiting. He will see Ortho in 1 month for follow up.  He sees cardiology on 8/16 and ophthalmology on 8/17.   Smoking status reviewed- former smoker quit 2002.  Review of Systems- see HPI   Objective:  BP (!) 155/109   Pulse 84   Temp 97.5 F (36.4 C) (Oral)   Wt 208 lb (94.3 kg)   SpO2 97%   BMI 28.21 kg/m  Vitals and nursing note reviewed  General: well nourished,  in NAD HEENT: normocephalic, moist mucous membranes, left eye with cloudy lens Cardiac: RRR, clear S1 and S2, no murmurs, rubs, or gallops Respiratory: clear to auscultation bilaterally, no crackles auscultated, no increased work of breathing Abdomen: soft, nontender, nondistended, no masses Extremities: right BKA, prosthesis in use today, mild pitting edema of left leg up to knee Skin: warm and dry, no rashes noted. Non-healing wound on right anterior shin without erythema or increased warmth, mild odor, no discharge noted. Skin around wound is hyperkeratotic and darkened. Left knee with signs of healed wound. Neuro: alert and oriented, no focal deficits   Assessment & Plan:    Chronic combined systolic and diastolic congestive heart failure ([place])  Doing well since discharge, weight is up 8 pounds but he is wearing prosthesis that weighs about 4-5 pounds. No shortness of breath, minimal swelling  -Follow up with cards on 8/16 -Continue current medication regimen, follow cardiology recommendations  DM (diabetes mellitus), type 2, uncontrolled (HCC)  A1C 13.1 in hospital, added metformin at discharge, has been taking 10U lantus every morning instead of 5 units per d/c instructions  -continue with 10 units as his sugars are still 200's fasting -tolerating metformin well will continue with 500mg  bid for now -follow up in  1 month, will consider increasing metformin at that time -advised him to decrease carb intake and replace with vegetables, he agreed to try this  Pressure ulcer  Without signs of infection, likely colonized, non-healing likely due to pressure from prosthetic use daily and history of walking on knees  -no need for antibiotics at this time -advised him to keep all pressure off of it to aid in healing otherwise this is unlikely to heal -contacting [facility] to see what happened with wound care at home as no one has seen him, will follow up and re-refer if needed -applied  dressing before he put prosthetic back on -advised him to cover wound before allowing it to come into contact with ground/prosthesis  -continue washing with soap and water -will follow up in 1 month    Lucila Maine, DO Family Medicine Resident PGY-1

## 2015-10-17 NOTE — Assessment & Plan Note (Addendum)
  A1C 13.1 in hospital, added metformin at discharge, has been taking 10U lantus every morning instead of 5 units per d/c instructions  -continue with 10 units as his sugars are still 200's fasting -tolerating metformin well will continue with 500mg  bid for now -follow up in 1 month, will consider increasing metformin at that time -advised him to decrease carb intake and replace with vegetables, he agreed to try this

## 2015-10-17 NOTE — Progress Notes (Signed)
follo

## 2015-10-17 NOTE — Assessment & Plan Note (Signed)
  Doing well since discharge, weight is up 8 pounds but he is wearing prosthesis that weighs about 4-5 pounds. No shortness of breath, minimal swelling  -Follow up with cards on 8/16 -Continue current medication regimen, follow cardiology recommendations

## 2015-10-17 NOTE — Patient Instructions (Addendum)
Continue taking your medicines. Continue 10 units Lantus in the morning  See your cardiologist on Wednesday and eye doctor Thursday  Follow up with me in 1 month for diabetes/medication check in

## 2015-10-17 NOTE — Telephone Encounter (Signed)
CSW discussed with paramedicine [name] today who shared concerns about patient continuing to not take his necessary medications. She shared concerns that patient does not appear to comprehend the consecquences of not taking his medications. CSW attempted to reach patient's sister to follow up and get some feedback from family. Patient's sisters number is not in service. CSW will follow up with patient this week during his clinic visit. [name], LCSW [phone]

## 2015-10-19 ENCOUNTER — Ambulatory Visit (HOSPITAL_COMMUNITY)
Admit: 2015-10-19 | Discharge: 2015-10-19 | Disposition: A | Discharge status: Home / Self Care | Payer: Medicaid Other | Source: Ambulatory Visit | Attending: Cardiology | Admitting: Cardiology

## 2015-10-19 VITALS — BP 140/82 | HR 74 | Resp 18 | Wt 206.0 lb

## 2015-10-19 DIAGNOSIS — I10 Essential (primary) hypertension: Secondary | ICD-10-CM

## 2015-10-19 DIAGNOSIS — I420 Dilated cardiomyopathy: Secondary | ICD-10-CM | POA: Diagnosis present

## 2015-10-19 DIAGNOSIS — E1122 Type 2 diabetes mellitus with diabetic chronic kidney disease: Secondary | ICD-10-CM | POA: Diagnosis not present

## 2015-10-19 DIAGNOSIS — E1140 Type 2 diabetes mellitus with diabetic neuropathy, unspecified: Secondary | ICD-10-CM | POA: Diagnosis not present

## 2015-10-19 DIAGNOSIS — H5442 Blindness, left eye, normal vision right eye: Secondary | ICD-10-CM | POA: Insufficient documentation

## 2015-10-19 DIAGNOSIS — Z9114 Patient's other noncompliance with medication regimen: Secondary | ICD-10-CM | POA: Diagnosis not present

## 2015-10-19 DIAGNOSIS — I130 Hypertensive heart and chronic kidney disease with heart failure and stage 1 through stage 4 chronic kidney disease, or unspecified chronic kidney disease: Secondary | ICD-10-CM | POA: Insufficient documentation

## 2015-10-19 DIAGNOSIS — I739 Peripheral vascular disease, unspecified: Secondary | ICD-10-CM | POA: Diagnosis not present

## 2015-10-19 DIAGNOSIS — Z79899 Other long term (current) drug therapy: Secondary | ICD-10-CM | POA: Diagnosis not present

## 2015-10-19 DIAGNOSIS — Z794 Long term (current) use of insulin: Secondary | ICD-10-CM | POA: Diagnosis not present

## 2015-10-19 DIAGNOSIS — Z87891 Personal history of nicotine dependence: Secondary | ICD-10-CM | POA: Insufficient documentation

## 2015-10-19 DIAGNOSIS — N183 Chronic kidney disease, stage 3 unspecified: Secondary | ICD-10-CM

## 2015-10-19 DIAGNOSIS — I5022 Chronic systolic (congestive) heart failure: Secondary | ICD-10-CM | POA: Insufficient documentation

## 2015-10-19 DIAGNOSIS — I5042 Chronic combined systolic (congestive) and diastolic (congestive) heart failure: Secondary | ICD-10-CM

## 2015-10-19 DIAGNOSIS — I428 Other cardiomyopathies: Secondary | ICD-10-CM | POA: Insufficient documentation

## 2015-10-19 DIAGNOSIS — Z89511 Acquired absence of right leg below knee: Secondary | ICD-10-CM | POA: Insufficient documentation

## 2015-10-19 DIAGNOSIS — E1165 Type 2 diabetes mellitus with hyperglycemia: Secondary | ICD-10-CM | POA: Insufficient documentation

## 2015-10-19 LAB — BASIC METABOLIC PANEL
Anion gap: 10 (ref 5–15)
BUN: 19 mg/dL (ref 6–20)
CO2: 28 mmol/L (ref 22–32)
Calcium: 9.1 mg/dL (ref 8.9–10.3)
Chloride: 101 mmol/L (ref 101–111)
Creatinine, Ser: 1.15 mg/dL (ref 0.61–1.24)
GFR calc Af Amer: >60 mL/min (ref >60)
GFR calc non Af Amer: >60 mL/min (ref >60)
Glucose, Bld: 244 mg/dL — ABNORMAL HIGH (ref 65–99)
Potassium: 3.8 mmol/L (ref 3.5–5.1)
Sodium: 139 mmol/L (ref 135–145)

## 2015-10-19 MED ORDER — CARVEDILOL 3.125 MG PO TABS: 3.1250 mg | ORAL_TABLET | Freq: Two times a day (BID) | ORAL | 3 refills | Status: DC

## 2015-10-19 MED ORDER — CARVEDILOL 6.25 MG PO TABS: 6.2500 mg | ORAL_TABLET | Freq: Two times a day (BID) | ORAL | 6 refills | Status: DC

## 2015-10-19 NOTE — Progress Notes (Signed)
Patient ID: [name], male   DOB: 05-13-1955, 60 y.o.   MRN: [id number]   ADVANCED HF CLINIC   HPI: [name] is a 60 year old male with a past medical history of HTN, DM, :PAD S/p right BKA with non-haling wound, admitted for acute HF in 5/17// Echo on 07/10/15 showed EF of 15%, diffuse hypokinesis, PA pressure elevated at 50mmHg.   Was hospitalized 5/5 - 07/20/15  Here for post hospital f/u. Diuresed 42 pounds 244->202  Admitted 8/3 through 10/10/2015 with marked volume overload in the setting of medication noncompliance. Medication regimen was simplified. Diuresed with IV lasix and transitioned to 80 mg lasix daily. Discharge weight 200 pounds.   Today he returns for post hospital follow up. Over the last week he has been taking all his medications.  Weight at home 206-209 pounds. Wearing prosthesis to walk. Able to do more around the house. He has been seen by Paramedicine. Lives alone. Has difficulty getting food.  Requires assistance with transportation.   Cath 07/13/15  Cardiac Cath Findings: Ao = 135/87 (107) LV = 141/20/30 RA = 17 RV = 67/11/20 PA = 62/29 (41) PCW = 35 (v wave 45-50) Fick cardiac output/index = 6.0/2.7 PVR = 1.0 WU SVR = 1198  FA sat = 88% PA sat = 58%, 57%  Labs (5/17): K 4.9 Cr 1.44 Labs (09/19/2015) : K 3.9 Creatinine 1.06 Labs 10/2015: Hgb A1C 13     Past Medical History:  Diagnosis Date  . Acute on chronic systolic heart failure, NYHA class 3 ([place])   . AKI (acute kidney injury) ([place])   . Anemia   . BKA stump complication ([place])   . Blind left eye   . 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 Combined systolic and diastolic congestive heart failure ([place])   . Congestive dilated cardiomyopathy ([place]) 07/11/2015  . Diabetes mellitus   . Diabetic neuropathy associated with type 2 diabetes mellitus ([place])   . Hypertension   . Noncompliance with medications   . Open knee wound 10/2015   on rt bka  . PAD (peripheral artery disease) ([place])   . Protein  calorie malnutrition ([place])   . Shortness of breath dyspnea     Current Outpatient Prescriptions  Medication Sig Dispense Refill  . atorvastatin (LIPITOR) 40 MG tablet Take 1 tablet (40 mg total) by mouth daily. 90 tablet 1  . brimonidine (ALPHAGAN) 0.2 % ophthalmic solution PLACE 1 DROP INTO THE LEFT EYE 2 (TWO) TIMES DAILY. 5 mL 0  . carvedilol (COREG) 3.125 MG tablet Take 3.125 mg by mouth 2 (two) times daily with a meal.  3  . dorzolamide-timolol (COSOPT) 22.3-6.8 MG/ML ophthalmic solution Place 1 drop into the left eye 2 (two) times daily. 10 mL 12  . furosemide (LASIX) 40 MG tablet Take 2 tablets (80 mg total) by mouth daily. 60 tablet 6  . insulin glargine (LANTUS) 100 UNIT/ML injection Inject 0.05 mLs (5 Units total) into the skin every morning. 10 mL 3  . metFORMIN (GLUCOPHAGE) 500 MG tablet Take 1 tablet (500 mg total) by mouth 2 (two) times daily with a meal. 60 tablet 6  . Multiple Vitamins-Minerals (MULTIVITAMIN WITH MINERALS) tablet Take 1 tablet by mouth daily.    . potassium chloride SA (K-DUR,KLOR-CON) 20 MEQ tablet Take 2 tablets (40 mEq total) by mouth daily. 30 tablet 6  . sacubitril-valsartan (ENTRESTO) 97-103 MG Take 1 tablet by mouth 2 (two) times daily. 60 tablet 6   No current facility-administered medications  for this encounter.     No Known Allergies    Social History   Social History  . Marital status: Married    Spouse name: N/A  . Number of children: N/A  . Years of education: N/A   Occupational History  . Not on file.   Social History Main Topics  . Smoking status: Former Smoker    Quit date: 05/10/2000  . Smokeless tobacco: Former Systems developer    Quit date: 02/22/2000  . Alcohol use No  . Drug use: No  . Sexual activity: Not on file   Other Topics Concern  . Not on file   Social History Narrative   ** Merged History Encounter **       Lives alone.       Family History  Problem Relation Age of Onset  . Cancer Mother   . Peripheral vascular  disease Father     Vitals:   10/19/15 1408  BP: 140/82  Pulse: 74  Resp: 18  SpO2: 100%  Weight: 206 lb (93.4 kg)    PHYSICAL EXAM: General:  Ambulated in the clinic with prosthesis on with a walker  HEENT: normal Neck: supple. JVD 5-6. Carotids 2+ bilat; no bruits. No lymphadenopathy or thryomegaly appreciated. Cor: PMI nondisplaced. Regular rate & rhythm. No rubs, or murmurs.+ S3 Lungs: clear Abdomen: soft, nontender, nondistended. No hepatosplenomegaly. No bruits or masses. Good bowel sounds. Extremities: no cyanosis, clubbing, rash, edema. RLE prosthesis  Neuro: alert & oriented x 3, cranial nerves grossly intact. moves all 4 extremities w/o difficulty. Affect pleasant.   ASSESSMENT & PLAN: 1. Chronic systolic HF due to NICM. 07/2015 ECHo EF 15%. Plan to repeat ECHO after HF meds optimized. NYHA II-III. Functional improvement.  . Volume status stable. Continue 80 mg lasix daily + 40 meq potassium daily.  -Increase carvedilol to 6.25 mg twice a day.  -Continue entresto 97-103 mg twice a day.   Reinforced daily weights, low salt food choices, and medication compliance.   2. HTN- better controlled. Continue current regimen and increase carvedilol .   3. DM2, poorly controlled- Hgb A1C 13. Needs follow up with PCP 4. PAD s/p BKA 5. CKD stage 3 - BMEt today.  6. Social- Geophysicist/field seismologist. HF SW evaluated today.    BMET today. Follow up un 2 weeks for medication titration. Amy Clegg NP-C  3:47 PM

## 2015-10-19 NOTE — Progress Notes (Signed)
Advanced Heart Failure Medication Review by a Pharmacist  Does the patient  feel that his/her medications are working for him/her?  yes  Has the patient been experiencing any side effects to the medications prescribed?  no  Does the patient measure his/her own blood pressure or blood glucose at home?  no   Does the patient have any problems obtaining medications due to transportation or finances?   no  Understanding of regimen: poor Understanding of indications: poor Potential of compliance: fair Patient understands to avoid NSAIDs. Patient understands to avoid decongestants.  Issues to address at subsequent visits: Compliance   Pharmacist comments:  [name] is a pleasant 60 yo M presenting with his medication bottles and [name] (paramedicine). He reports fair compliance over the past week stating he has only missed 2 doses of his medications. He states that he is not having the upset stomach he was having before with his medications and is trying to take them with a little bit of food. He did not have any other medication-related questions or concerns for me at this time.   Ruta Hinds. Velva Harman, PharmD, BCPS, CPP Clinical Pharmacist Pager: [phone] Phone: [phone] 10/19/2015 2:17 PM      Time with patient: 10 minutes Preparation and documentation time: 2 minutes Total time: 12 minutes

## 2015-10-19 NOTE — Patient Instructions (Signed)
INCREASE Carvedilol (Coreg) to 6.25 mg twice daily.  Routine lab work today. Will notify you of abnormal results, otherwise no news is good news!  Follow up 2 weeks with Amy Clegg NP-C.  Do the following things EVERYDAY: 1) Weigh yourself in the morning before breakfast. Write it down and keep it in a log. 2) Take your medicines as prescribed 3) Eat low salt foods-Limit salt (sodium) to 2000 mg per day.  4) Stay as active as you can everyday 5) Limit all fluids for the day to less than 2 liters

## 2015-10-20 NOTE — Progress Notes (Signed)
Paramedicine Multidisciplinary Team Update  [name] is enrolled in the [employer] Paramedicine Program through [facility].  The patient presents today in association with an [facility] Appointment.  Patient living/home environment and social support-- Patient lives alone at home. His sister escorted him today to the HF clinic. [name]-[phone]   Does the patient have a scale and weigh each day?yes  Does the patient follow a low salt diet? Patient reports he is attempting to maintain diet. He prepares his own meal and his sister assists with grocery shopping.  Is patient compliant with medications? Patient was previously non compliant although with [name] paramedic prefilling his med box he was successful this past week with all his medications.  Does the patient have transportation for physician appointments? Sister will transport as needed  Does the patient contact the HF Clinic appropriately with worsening symptoms or weight increases? He verbalizes understanding and has weekly visits with the paramedicine program to reinforce education.   Are there any identified obstacles / challenges for adherence to current treatment plan?  Patient appears to lack knowledge of importance of medications and knowing HF signs and symptoms although today he presents to the clinic with improved support (sister) and understanding of importance of meds, diet and symptoms. CSW will continue to follow and coordinate care with paramedicine program. [name], LCSW [phone]

## 2015-10-20 NOTE — Addendum Note (Signed)
Encounter addended by: Louann Liv, LCSW on: 10/20/2015  4:15 PM<BR>    Actions taken: Sign clinical note

## 2015-10-27 ENCOUNTER — Telehealth: Payer: Self-pay | Admitting: Family Medicine

## 2015-10-27 NOTE — Telephone Encounter (Signed)
Spoke with cheryl and informed her that form needs to be placed in medical records folder.  Jazmin Hartsell,CMA

## 2015-10-27 NOTE — Telephone Encounter (Signed)
Delbert Phenix brought Medical release form to office requesting medical records for [name] for the Disability office. Please let her know when they may be picked up [phone].  Form placed in blue folder at front desk.

## 2015-11-02 ENCOUNTER — Ambulatory Visit (HOSPITAL_COMMUNITY)
Admission: RE | Admit: 2015-11-02 | Discharge: 2015-11-02 | Disposition: A | Discharge status: Home / Self Care | Payer: Medicaid Other | Source: Ambulatory Visit | Attending: Cardiology | Admitting: Cardiology

## 2015-11-02 ENCOUNTER — Encounter (HOSPITAL_COMMUNITY): Payer: Self-pay

## 2015-11-02 VITALS — BP 172/110 | HR 84 | Wt 202.8 lb

## 2015-11-02 DIAGNOSIS — Z89511 Acquired absence of right leg below knee: Secondary | ICD-10-CM | POA: Diagnosis not present

## 2015-11-02 DIAGNOSIS — E1165 Type 2 diabetes mellitus with hyperglycemia: Secondary | ICD-10-CM | POA: Diagnosis not present

## 2015-11-02 DIAGNOSIS — E1122 Type 2 diabetes mellitus with diabetic chronic kidney disease: Secondary | ICD-10-CM | POA: Diagnosis not present

## 2015-11-02 DIAGNOSIS — I5042 Chronic combined systolic (congestive) and diastolic (congestive) heart failure: Secondary | ICD-10-CM | POA: Diagnosis not present

## 2015-11-02 DIAGNOSIS — I428 Other cardiomyopathies: Secondary | ICD-10-CM | POA: Insufficient documentation

## 2015-11-02 DIAGNOSIS — I5022 Chronic systolic (congestive) heart failure: Secondary | ICD-10-CM | POA: Diagnosis present

## 2015-11-02 DIAGNOSIS — Z794 Long term (current) use of insulin: Secondary | ICD-10-CM | POA: Insufficient documentation

## 2015-11-02 DIAGNOSIS — N183 Chronic kidney disease, stage 3 unspecified: Secondary | ICD-10-CM

## 2015-11-02 DIAGNOSIS — E1140 Type 2 diabetes mellitus with diabetic neuropathy, unspecified: Secondary | ICD-10-CM | POA: Insufficient documentation

## 2015-11-02 DIAGNOSIS — Z87891 Personal history of nicotine dependence: Secondary | ICD-10-CM | POA: Insufficient documentation

## 2015-11-02 DIAGNOSIS — I158 Other secondary hypertension: Secondary | ICD-10-CM

## 2015-11-02 DIAGNOSIS — I420 Dilated cardiomyopathy: Secondary | ICD-10-CM

## 2015-11-02 DIAGNOSIS — Z79899 Other long term (current) drug therapy: Secondary | ICD-10-CM | POA: Diagnosis not present

## 2015-11-02 DIAGNOSIS — Z9114 Patient's other noncompliance with medication regimen: Secondary | ICD-10-CM | POA: Diagnosis not present

## 2015-11-02 DIAGNOSIS — I130 Hypertensive heart and chronic kidney disease with heart failure and stage 1 through stage 4 chronic kidney disease, or unspecified chronic kidney disease: Secondary | ICD-10-CM | POA: Insufficient documentation

## 2015-11-02 MED ORDER — CARVEDILOL 6.25 MG PO TABS: 9.3750 mg | ORAL_TABLET | Freq: Two times a day (BID) | ORAL | 6 refills | Status: DC

## 2015-11-02 NOTE — Progress Notes (Signed)
Patient ID: [name], male   DOB: Apr 20, 1955, 60 y.o.   MRN: [id number]   [facility]IC   HPI: [name] is a 60 year old male with a past medical history of HTN, DM, :PAD S/p right BKA with non-haling wound, admitted for acute HF in 5/17// Echo on 07/10/15 showed EF of 15%, diffuse hypokinesis, PA pressure elevated at 29mmHg.   Was hospitalized 5/5 - 07/20/15  Here for post hospital f/u. Diuresed 42 pounds 244->202  Admitted 8/3 through 10/10/2015 with marked volume overload in the setting of medication noncompliance. Medication regimen was simplified. Diuresed with IV lasix and transitioned to 80 mg lasix daily. Discharge weight 200 pounds.   Today he returns for  follow up with paramedicine and his sister. Overall feeling good. Denies SOB/PND/Orthopnea. Does admit to mild dyspnea after walking to mail box which is about a block. Weight at home 206-208 pounds.Eating lots of water melon.  Misses medications a few times a week. Wearing prosthesis to walk. Able to do more around the house. He has been seen by Paramedicine. Lives alone. Has difficulty getting food.  Requires assistance with transportation.   Cath 07/13/15  Cardiac Cath Findings: Ao = 135/87 (107) LV = 141/20/30 RA = 17 RV = 67/11/20 PA = 62/29 (41) PCW = 35 (v wave 45-50) Fick cardiac output/index = 6.0/2.7 PVR = 1.0 WU SVR = 1198  FA sat = 88% PA sat = 58%, 57%  Labs (5/17): K 4.9 Cr 1.44 Labs (09/19/2015) : K 3.9 Creatinine 1.06 Labs 10/2015: Hgb A1C 13     Past Medical History:  Diagnosis Date  . Acute on chronic systolic heart failure, NYHA class 3 ([place])   . AKI (acute kidney injury) ([place])   . Anemia   . BKA stump complication ([place])   . Blind left eye   . Cardiomyopathy, dilated ([place])   . Combined systolic and diastolic congestive heart failure ([place])   . Congestive dilated cardiomyopathy ([place]) 07/11/2015  . Diabetes mellitus   . Diabetic neuropathy associated with type 2 diabetes mellitus ([place])   .  Hypertension   . Noncompliance with medications   . Open knee wound 10/2015   on rt bka  . PAD (peripheral artery disease) ([place])   . Protein calorie malnutrition ([place])   . Shortness of breath dyspnea     Current Outpatient Prescriptions  Medication Sig Dispense Refill  . atorvastatin (LIPITOR) 40 MG tablet Take 1 tablet (40 mg total) by mouth daily. 90 tablet 1  . brimonidine (ALPHAGAN) 0.2 % ophthalmic solution PLACE 1 DROP INTO THE LEFT EYE 2 (TWO) TIMES DAILY. 5 mL 0  . carvedilol (COREG) 6.25 MG tablet Take 1 tablet (6.25 mg total) by mouth 2 (two) times daily with a meal. 60 tablet 6  . dorzolamide-timolol (COSOPT) 22.3-6.8 MG/ML ophthalmic solution Place 1 drop into the left eye 2 (two) times daily. 10 mL 12  . furosemide (LASIX) 40 MG tablet Take 2 tablets (80 mg total) by mouth daily. 60 tablet 6  . insulin glargine (LANTUS) 100 UNIT/ML injection Inject 0.05 mLs (5 Units total) into the skin every morning. 10 mL 3  . metFORMIN (GLUCOPHAGE) 500 MG tablet Take 1 tablet (500 mg total) by mouth 2 (two) times daily with a meal. 60 tablet 6  . Multiple Vitamins-Minerals (MULTIVITAMIN WITH MINERALS) tablet Take 1 tablet by mouth daily.    . potassium chloride SA (K-DUR,KLOR-CON) 20 MEQ tablet Take 2 tablets (40 mEq total) by mouth  daily. 30 tablet 6  . sacubitril-valsartan (ENTRESTO) 97-103 MG Take 1 tablet by mouth 2 (two) times daily. 60 tablet 6   No current facility-administered medications for this encounter.     No Known Allergies    Social History   Social History  . Marital status: Married    Spouse name: N/A  . Number of children: N/A  . Years of education: N/A   Occupational History  . Not on file.   Social History Main Topics  . Smoking status: Former Smoker    Quit date: 05/10/2000  . Smokeless tobacco: Former Systems developer    Quit date: 02/22/2000  . Alcohol use No  . Drug use: No  . Sexual activity: Not on file   Other Topics Concern  . Not on file   Social  History Narrative   ** Merged History Encounter **       Lives alone.       Family History  Problem Relation Age of Onset  . Cancer Mother   . Peripheral vascular disease Father     Vitals:   11/02/15 1507  BP: (!) 172/110  Pulse: 84  SpO2: 94%  Weight: 202 lb 12.8 oz (92 kg)    PHYSICAL EXAM: General:  Ambulated in the clinic with prosthesis on with a walker Sister present.  HEENT: normal Neck: supple. JVD 5-6. Carotids 2+ bilat; no bruits. No lymphadenopathy or thryomegaly appreciated. Cor: PMI nondisplaced. Regular rate & rhythm. No rubs, or murmurs.+ S3 Lungs: clear Abdomen: soft, nontender, nondistended. No hepatosplenomegaly. No bruits or masses. Good bowel sounds. Extremities: no cyanosis, clubbing, rash, edema. RLE prosthesis  Neuro: alert & oriented x 3, cranial nerves grossly intact. moves all 4 extremities w/o difficulty. Affect pleasant.   ASSESSMENT & PLAN: 1. Chronic systolic HF due to NICM. 07/2015 ECHo EF 15%. Plan to repeat ECHO after HF meds optimized.  NYHA II-III. Marland Kitchen Volume status stable. Continue 80 mg lasix daily + 40 meq potassium daily.  -Increase carvedilol to 9.375 mg twice a day.   -Continue entresto 97-103 mg twice a day - Not on dig or spiro due to compliance. .   Reinforced daily weights, low salt food choices, and medication compliance.   2. HTN- Elevated. Continue current regimen and increase carvedilol today. May need to add amlodipine if SBP remains elevated.    3. DM2, poorly controlled- Hgb A1C 13. Has follow up with PCP in September.   4. PAD s/p BKA 5. CKD stage 3 - Reviewed BMET from 10/19/2015 6. Social- Geophysicist/field seismologist.   We are intentionally trying to keep his medication regimen simple for this reason his in not on bidil. I have told him we may need to amlodipine if SBP remains high. Plan to repeat ECHO in a few months. Follow up un 2 weeks for medication titration. Jake Clegg NP-C  3:12 PM

## 2015-11-02 NOTE — Patient Instructions (Signed)
Increase Coreg to 9.375 mg, (one and one half tab) twice a day  Your physician recommends that you schedule a follow-up appointment in: 2 weeks In the [place] following things EVERYDAY: 1) Weigh yourself in the morning before breakfast. Write it down and keep it in a log. 2) Take your medicines as prescribed 3) Eat low salt foods-Limit salt (sodium) to 2000 mg per day.  4) Stay as active as you can everyday 5) Limit all fluids for the day to less than 2 liters .

## 2015-11-03 ENCOUNTER — Encounter (HOSPITAL_COMMUNITY): Payer: Medicaid Other | Admitting: Internal Medicine

## 2015-11-03 NOTE — Addendum Note (Signed)
Encounter addended by: Louann Liv, LCSW on: 11/03/2015  2:05 PM<BR>    Actions taken: Sign clinical note

## 2015-11-03 NOTE — Progress Notes (Signed)
CSW met with patient and sister in the clinic. Patient states he is doing well at home and denies any concerns. Patient has medicaid although currently has no services other than paramedicine in the home. Patient and sister are open to explore CAP program. Patient's sister started the application alhtough requested to return to meet with CSW for further assistance. CSW will continue to follow and coordinate care with paramedicine. [name], LCSW [phone]

## 2015-11-08 ENCOUNTER — Telehealth (HOSPITAL_COMMUNITY): Payer: Self-pay

## 2015-11-08 MED ORDER — POTASSIUM CHLORIDE CRYS ER 20 MEQ PO TBCR: 40.0000 meq | EXTENDED_RELEASE_TABLET | Freq: Every day | ORAL | 6 refills | Status: DC

## 2015-11-08 NOTE — Telephone Encounter (Signed)
[name] with CHF Paramedicine Program called with update on patient during routine visit today. States patient's BP up at 150/110, however, patient's pill box has coreg in them, and patient thinks that when he turns over pill box into his hand the "tiny white pill" isn't sliding out into his hand.  [name] educated on making sure pill box is completely empty at each med admin. Also asking for refill on potassium as he was given a quantity of 15 days on his most recent fill.  30 day supply sent to preferred pharmacy electronically. Otherwise, patient endorses feeling fine with no issues or concerns. No changes/new orders per CHF providers, will continue to monitor.  Renee Pain, RN

## 2015-11-09 ENCOUNTER — Other Ambulatory Visit (HOSPITAL_COMMUNITY): Payer: Self-pay | Admitting: Pharmacist

## 2015-11-15 ENCOUNTER — Encounter: Payer: Self-pay | Admitting: Family Medicine

## 2015-11-15 ENCOUNTER — Ambulatory Visit (INDEPENDENT_AMBULATORY_CARE_PROVIDER_SITE_OTHER): Payer: Medicaid Other | Admitting: Family Medicine

## 2015-11-15 VITALS — BP 141/99 | HR 75 | Temp 97.7°F | Wt 204.0 lb

## 2015-11-15 DIAGNOSIS — L8990 Pressure ulcer of unspecified site, unspecified stage: Secondary | ICD-10-CM

## 2015-11-15 DIAGNOSIS — E1142 Type 2 diabetes mellitus with diabetic polyneuropathy: Secondary | ICD-10-CM

## 2015-11-15 DIAGNOSIS — E1165 Type 2 diabetes mellitus with hyperglycemia: Secondary | ICD-10-CM

## 2015-11-15 DIAGNOSIS — IMO0002 Reserved for concepts with insufficient information to code with codable children: Secondary | ICD-10-CM

## 2015-11-15 DIAGNOSIS — Z794 Long term (current) use of insulin: Secondary | ICD-10-CM | POA: Diagnosis not present

## 2015-11-15 MED ORDER — GLUCOSE BLOOD VI STRP: ORAL_STRIP | 12 refills | Status: DC

## 2015-11-15 NOTE — Progress Notes (Signed)
    Subjective:    Patient ID: [name], male    DOB: 01-11-1956, 60 y.o.   MRN: [id number]   [name] is here to follow up on diabetes. Other concerns include increasing blurry vision.  He reports not taking his Lantus 10 units every day as directed. Stopped doing this about 2 weeks ago. Cannot give a good reason why, just stopped taking it. Fasting sugars have been in the 300's. Prior to stopping they were in low 200's. He agrees that the 300's are too high. He said he saw one reading in the 400's, which scared him, so he took 10 units of Novolog. He then felt ill and checked his sugar again, at which point it was 76. He drank soda and felt better. He denies feeling sick after using Lantus. He feels he can continue to take it and that doing so at night would be easier. He takes 500 mg Metformin every day and does not want to increase this medicine at this time if he is going to start to take the Lantus again.   Blurry vision has been occurring since last hospitalization. He feels he has a hard time seeing tiny print, cannot read newspaper anymore. Is aware that high sugars can affect his vision. Already has poor vision in left eye. He has an appointment with eye doctor in 2 weeks.  Pressure ulcer on right leg stump has been improving. He has been avoiding crawling on his knees to get around and has avoided using prosthesis, only puts it on if he needs to leave the house. He is happy with how it has been healing and sees Dr. Sharol Given tomorrow.   Smoking status reviewed- former smoker   Review of Systems- denies weight changes, increased thirst, increased hunger, increased urination.    Objective:  BP (!) 141/99   Pulse 75   Temp 97.7 F (36.5 C) (Oral)   Wt 204 lb (92.5 kg)   SpO2 99%   BMI 27.67 kg/m  Vitals and nursing note reviewed  General: well nourished, in no acute distress, using walker in addition to prosthesis HEENT: normocephalic, no scleral icterus or conjunctival  pallor, no nasal discharge, moist mucous membranes, Cardiac: RRR, clear S1 and S2, no murmurs, rubs, or gallops Respiratory: clear to auscultation bilaterally, no increased work of breathing Extremities: trace edema on left lower leg, no cyanosis. Right BKA.  Skin: warm and dry, no rashes noted. Pressure ulcer on right anterior knee without erythema, exudate. No odor.  Neuro: alert and oriented, no focal deficits   Assessment & Plan:    DM (diabetes mellitus), type 2, uncontrolled ([place])  Has not been taking Lantus. Fasting sugars in 300's. Additionally has had blurry vision, is seeing eye doctor in 2 weeks.   -patient agreed to take Lantus 10 units at night -check fasting sugars as he has been doing -return in 2 weeks to assess blood glucose control -refilled test strips  Pressure ulcer  Improving, sees Dr. Sharol Given tomorrow  -continue to avoid pressure on this area -explained better sugar control will aid wound healing, patient verbalized understanding -will reassess in 2 weeks    Return in about 2 weeks (around 11/29/2015).   Lucila Maine, DO Family Medicine Resident PGY-1

## 2015-11-15 NOTE — Assessment & Plan Note (Addendum)
  Has not been taking Lantus. Fasting sugars in 300's. Additionally has had blurry vision, is seeing eye doctor in 2 weeks.   -patient agreed to take Lantus 10 units at night -check fasting sugars as he has been doing -return in 2 weeks to assess blood glucose control -refilled test strips

## 2015-11-15 NOTE — Patient Instructions (Signed)
  Please continue taking your Lantus, 10 units every night.  Check your blood sugars in the morning  Please come back to see me in 2 weeks so we can see how your sugars are being controlled.  Call if you have any questions or need to be seen sooner!

## 2015-11-15 NOTE — Assessment & Plan Note (Signed)
  Improving, sees Dr. Sharol Given tomorrow  -continue to avoid pressure on this area -explained better sugar control will aid wound healing, patient verbalized understanding -will reassess in 2 weeks

## 2015-11-17 ENCOUNTER — Encounter (HOSPITAL_COMMUNITY): Payer: Self-pay

## 2015-11-17 ENCOUNTER — Ambulatory Visit (HOSPITAL_COMMUNITY)
Admission: RE | Admit: 2015-11-17 | Discharge: 2015-11-17 | Disposition: A | Discharge status: Home / Self Care | Payer: Medicaid Other | Source: Ambulatory Visit | Attending: Internal Medicine | Admitting: Internal Medicine

## 2015-11-17 ENCOUNTER — Other Ambulatory Visit: Payer: Self-pay | Admitting: Family Medicine

## 2015-11-17 VITALS — BP 153/92 | HR 70 | Wt 206.0 lb

## 2015-11-17 DIAGNOSIS — T879 Unspecified complications of amputation stump: Secondary | ICD-10-CM

## 2015-11-17 DIAGNOSIS — E1165 Type 2 diabetes mellitus with hyperglycemia: Secondary | ICD-10-CM | POA: Diagnosis not present

## 2015-11-17 DIAGNOSIS — I5022 Chronic systolic (congestive) heart failure: Secondary | ICD-10-CM | POA: Insufficient documentation

## 2015-11-17 DIAGNOSIS — I130 Hypertensive heart and chronic kidney disease with heart failure and stage 1 through stage 4 chronic kidney disease, or unspecified chronic kidney disease: Secondary | ICD-10-CM | POA: Diagnosis not present

## 2015-11-17 DIAGNOSIS — Z89511 Acquired absence of right leg below knee: Secondary | ICD-10-CM | POA: Diagnosis not present

## 2015-11-17 DIAGNOSIS — Z8249 Family history of ischemic heart disease and other diseases of the circulatory system: Secondary | ICD-10-CM | POA: Insufficient documentation

## 2015-11-17 DIAGNOSIS — Z79899 Other long term (current) drug therapy: Secondary | ICD-10-CM | POA: Diagnosis not present

## 2015-11-17 DIAGNOSIS — Z794 Long term (current) use of insulin: Secondary | ICD-10-CM | POA: Insufficient documentation

## 2015-11-17 DIAGNOSIS — I739 Peripheral vascular disease, unspecified: Secondary | ICD-10-CM | POA: Diagnosis not present

## 2015-11-17 DIAGNOSIS — Z87891 Personal history of nicotine dependence: Secondary | ICD-10-CM | POA: Diagnosis not present

## 2015-11-17 DIAGNOSIS — I10 Essential (primary) hypertension: Secondary | ICD-10-CM

## 2015-11-17 DIAGNOSIS — I5042 Chronic combined systolic (congestive) and diastolic (congestive) heart failure: Secondary | ICD-10-CM

## 2015-11-17 DIAGNOSIS — I428 Other cardiomyopathies: Secondary | ICD-10-CM | POA: Insufficient documentation

## 2015-11-17 DIAGNOSIS — Z9114 Patient's other noncompliance with medication regimen: Secondary | ICD-10-CM

## 2015-11-17 DIAGNOSIS — N183 Chronic kidney disease, stage 3 unspecified: Secondary | ICD-10-CM

## 2015-11-17 DIAGNOSIS — E1122 Type 2 diabetes mellitus with diabetic chronic kidney disease: Secondary | ICD-10-CM | POA: Diagnosis not present

## 2015-11-17 DIAGNOSIS — E1140 Type 2 diabetes mellitus with diabetic neuropathy, unspecified: Secondary | ICD-10-CM | POA: Diagnosis not present

## 2015-11-17 MED ORDER — CARVEDILOL 12.5 MG PO TABS: 12.5000 mg | ORAL_TABLET | Freq: Two times a day (BID) | ORAL | 6 refills | Status: DC

## 2015-11-17 NOTE — Addendum Note (Signed)
Encounter addended by: Kerry Dory, CMA on: 11/17/2015  3:16 PM<BR>    Actions taken: Order Entry activity accessed, Sign clinical note

## 2015-11-17 NOTE — Progress Notes (Signed)
Patient ID: ARSH GRAUEL, male   DOB: 1955-04-28, 60 y.o.   MRN: [id number]   [facility]ED [facility]IN[facility]   HPI: Mr. Milardo is a 60 year old male with a past medical history of HTN, DM, :PAD S/p right BKA with non-haling wound, admitted for acute HF in 5/17// Echo on 07/10/15 showed EF of 15%, diffuse hypokinesis, PA pressure elevated at 7mmHg.   Was hospitalized 5/5 - 07/20/15  Here for post hospital f/u. Diuresed 42 pounds 244->202  Admitted 8/3 through 10/10/2015 with marked volume overload in the setting of medication noncompliance. Medication regimen was simplified. Diuresed with IV lasix and transitioned to 80 mg lasix daily. Discharge weight 200 pounds.   Today he returns for  follow up with paramedicine and his sister. Last visit carvedilol was increased to 9.375 mg twice a day. Has been taking all medications. Misses a dose or two of carvedilol  Due to the size of the pills. Weight at home 202 pounds. BP has been high 150/100s. He has been seen by Paramedicine. Lives alone. Has difficulty getting food.  Requires assistance with transportation.   Cath 07/13/15  Cardiac Cath Findings: Ao = 135/87 (107) LV = 141/20/30 RA = 17 RV = 67/11/20 PA = 62/29 (41) PCW = 35 (v wave 45-50) Fick cardiac output/index = 6.0/2.7 PVR = 1.0 WU SVR = 1198  FA sat = 88% PA sat = 58%, 57%  Labs (5/17): K 4.9 Cr 1.44 Labs (09/19/2015) : K 3.9 Creatinine 1.06 Labs 10/2015: Hgb A1C 13     Past Medical History:  Diagnosis Date  . Acute on chronic systolic heart failure, NYHA class 3 ([place])   . AKI (acute kidney injury) ([place])   . Anemia   . BKA stump complication ([place])   . Blind left eye   . Cardiomyopathy, dilated ([place])   . Combined systolic and diastolic congestive heart failure ([place])   . Congestive dilated cardiomyopathy ([place]) 07/11/2015  . Diabetes mellitus   . Diabetic neuropathy associated with type 2 diabetes mellitus ([place])   . Hypertension   . Noncompliance with medications   . Open knee wound  10/2015   on rt bka  . PAD (peripheral artery disease) ([place])   . Protein calorie malnutrition ([place])   . Shortness of breath dyspnea     Current Outpatient Prescriptions  Medication Sig Dispense Refill  . ACCU-CHEK AVIVA PLUS test strip CHECK SUGAR 3 TIMES DAILY 100 each 3  . atorvastatin (LIPITOR) 40 MG tablet Take 1 tablet (40 mg total) by mouth daily. 90 tablet 1  . brimonidine (ALPHAGAN) 0.2 % ophthalmic solution PLACE 1 DROP INTO THE LEFT EYE 2 (TWO) TIMES DAILY. 5 mL 0  . carvedilol (COREG) 6.25 MG tablet Take 1.5 tablets (9.375 mg total) by mouth 2 (two) times daily with a meal. 90 tablet 6  . dorzolamide-timolol (COSOPT) 22.3-6.8 MG/ML ophthalmic solution Place 1 drop into the left eye 2 (two) times daily. 10 mL 12  . furosemide (LASIX) 40 MG tablet Take 2 tablets (80 mg total) by mouth daily. 60 tablet 6  . glucose blood (ACCU-CHEK ACTIVE STRIPS) test strip Use as instructed 100 each 12  . insulin glargine (LANTUS) 100 UNIT/ML injection Inject 10 Units into the skin at bedtime.    . metFORMIN (GLUCOPHAGE) 500 MG tablet Take 1 tablet (500 mg total) by mouth 2 (two) times daily with a meal. 60 tablet 6  . Multiple Vitamins-Minerals (MULTIVITAMIN WITH MINERALS) tablet Take 1 tablet by mouth daily.    Marland Kitchen  potassium chloride SA (K-DUR,KLOR-CON) 20 MEQ tablet Take 2 tablets (40 mEq total) by mouth daily. 60 tablet 6  . sacubitril-valsartan (ENTRESTO) 97-103 MG Take 1 tablet by mouth 2 (two) times daily. 60 tablet 6   No current facility-administered medications for this encounter.     No Known Allergies    Social History   Social History  . Marital status: Married    Spouse name: N/A  . Number of children: N/A  . Years of education: N/A   Occupational History  . Not on file.   Social History Main Topics  . Smoking status: Former Smoker    Quit date: 05/10/2000  . Smokeless tobacco: Former Systems developer    Quit date: 02/22/2000  . Alcohol use No  . Drug use: No  . Sexual activity:  Not on file   Other Topics Concern  . Not on file   Social History Narrative   ** Merged History Encounter **       Lives alone.       Family History  Problem Relation Age of Onset  . Cancer Mother   . Peripheral vascular disease Father     Vitals:   11/17/15 1432  BP: (!) 153/92  Pulse: 70  SpO2: 98%  Weight: 206 lb (93.4 kg)    PHYSICAL EXAM: General:  Ambulated in the clinic with prosthesis on with a walker Sister present.  HEENT: normal Neck: supple. JVD 5-6. Carotids 2+ bilat; no bruits. No lymphadenopathy or thryomegaly appreciated. Cor: PMI nondisplaced. Regular rate & rhythm. No rubs, or murmurs.+ S3 Lungs: clear Abdomen: soft, nontender, nondistended. No hepatosplenomegaly. No bruits or masses. Good bowel sounds. Extremities: no cyanosis, clubbing, rash, edema. RLE prosthesis  Neuro: alert & oriented x 3, cranial nerves grossly intact. moves all 4 extremities w/o difficulty. Affect pleasant.   ASSESSMENT & PLAN: 1. Chronic systolic HF due to NICM. 07/2015 ECHo EF 15%. Plan to repeat ECHO after HF meds optimized.  NYHA II-III. Marland Kitchen Volume status stable. Continue 80 mg lasix daily + 40 meq potassium daily.  -Increase  Coreg to 12.5 mg twice a day.  -Continue entresto 97-103 mg twice a day Not on spiro or dig due to compliance. Consider spiro at next visit. He is doing better taking all medications.  Reinforced daily weights, low salt food choices, and medication compliance.   2. HTN- Elevated. Continue current regimen and increase carvedilol today. May need to add amlodipine if SBP remains elevated.    3. DM2, poorly controlled- Hgb A1C 13. Saw PCP earlier this week with adjustments made.    4. PAD s/p R BKA- Followed by Dr Sharol Given.  5. CKD stage 3 - Reviewed BMET from 10/19/2015 6. Social- Geophysicist/field seismologist.   Follow up in 2 weeks. Today he was seen HFSW and Paramedicine. High risk for re admit due to poor insight. He seems to be doing better with close follow up  in the HF clinic and Paramedicine.   Emiley Digiacomo NP-C  2:54 PM

## 2015-11-17 NOTE — Patient Instructions (Signed)
INCREASE Coreg to 12.5 mg, one tab twice a day  Your physician recommends that you schedule a follow-up appointment in: 2 weeks  Do the following things EVERYDAY: 1) Weigh yourself in the morning before breakfast. Write it down and keep it in a log. 2) Take your medicines as prescribed 3) Eat low salt foods-Limit salt (sodium) to 2000 mg per day.  4) Stay as active as you can everyday 5) Limit all fluids for the day to less than 2 liters

## 2015-11-17 NOTE — Progress Notes (Signed)
Advanced Heart Failure Medication Review by a Pharmacist  Does the patient  feel that his/her medications are working for him/her?  yes  Has the patient been experiencing any side effects to the medications prescribed?  no  Does the patient measure his/her own blood pressure or blood glucose at home?  yes   Does the patient have any problems obtaining medications due to transportation or finances?   no  Understanding of regimen: fair Understanding of indications: fair Potential of compliance: good Patient understands to avoid NSAIDs. Patient understands to avoid decongestants.  Issues to address at subsequent visits: None   Pharmacist comments:  [name] is a pleasant 60 yo M presenting with [name] (paramedicine) and his medication bottles. He reports good compliance with his regimen since [name] has been filling his pillbox for him. He did not have any other medication-related questions or concerns for me at this time.   Ruta Hinds. Velva Harman, PharmD, BCPS, CPP Clinical Pharmacist Pager: [phone] Phone: [phone] 11/17/2015 2:37 PM      Time with patient: 10 minutes Preparation and documentation time: 6 minutes Total time: 16 minutes

## 2015-11-24 ENCOUNTER — Other Ambulatory Visit (HOSPITAL_COMMUNITY): Payer: Self-pay | Admitting: Adult Health

## 2015-11-29 NOTE — Progress Notes (Signed)
Subjective:     Patient ID: [name], male   DOB: 1955/04/03, 60 y.o.   MRN: [id number]  HPI [name] is a 60yo male presenting today for diabetes followup. - Last office visit for diabetes follow up 11/15/15. Reported not taking his Lantus 10 units every day as directed. Ophthalmology follow up scheduled in 2 weeks.  - Has been checking his blood sugars fasting daily. Usually in upper 200s to low 300s. Denies any low readings or feelings of hypoglycemia. Does not have log or glucometer today. - Taking Lantus as prescribed. Not interested in titrating up Metformin - Reports he followed up with Ophthalmology. Was told he has Glaucoma and Diabetic Retinopathy. Was given a shot in his eye and is supposed to follow up. - Follows with Orthopedics, Dr. Sharol Given, for management of ulcers on his left lower extremity and right stump. - Denies chest pain, numbness - Blindness of left eye noted - Last A1C 13.1 in 10/2011 - 24hr urine protein elevated on 07/2015. Currently taking Sacubitril-Valsartan.  Review of Systems Per HPI    Objective:   Physical Exam  Constitutional: He appears well-developed and well-nourished. No distress.  HENT:  Head: Normocephalic and atraumatic.  Cardiovascular: Normal rate and regular rhythm.   No murmur heard. Pulmonary/Chest: Effort normal. No respiratory distress.  Abdominal: He exhibits no distension. There is no tenderness.  Musculoskeletal: He exhibits edema.  Healing ulcers noted on shins of left lower extremity and right stump  Psychiatric: He has a normal mood and affect. His behavior is normal.      Assessment and Plan:     DM (diabetes mellitus), type 2, uncontrolled ([name]) - No blood sugar logs, but reports only checking fasting with levels in upper 200s, lower 300s. - Increase Lantus to 15units daily - Continue Metformin. Does not wish to titrate up, but will continue to consider at future visits. - Recommend checking blood sugar fasting and one  hour after the largest meal of the day instead of only fasting. To write down readings and bring the log as well as the glucometer to the next office visit - Follow up with Ophthalmology as scheduled. Follow up with Dr. Sharol Given for diabetic ulcer management as scheduled - Follow up at [facility] in one month

## 2015-11-30 ENCOUNTER — Ambulatory Visit (INDEPENDENT_AMBULATORY_CARE_PROVIDER_SITE_OTHER): Payer: Medicaid Other | Admitting: Family Medicine

## 2015-11-30 DIAGNOSIS — E1142 Type 2 diabetes mellitus with diabetic polyneuropathy: Secondary | ICD-10-CM

## 2015-11-30 DIAGNOSIS — E1165 Type 2 diabetes mellitus with hyperglycemia: Secondary | ICD-10-CM

## 2015-11-30 DIAGNOSIS — IMO0002 Reserved for concepts with insufficient information to code with codable children: Secondary | ICD-10-CM

## 2015-11-30 DIAGNOSIS — Z794 Long term (current) use of insulin: Secondary | ICD-10-CM | POA: Diagnosis not present

## 2015-11-30 MED ORDER — INSULIN GLARGINE 100 UNIT/ML ~~LOC~~ SOLN: 15.0000 [IU] | Freq: Every day | SUBCUTANEOUS | 11 refills | Status: DC

## 2015-11-30 MED ORDER — INSULIN GLARGINE 100 UNIT/ML SOLOSTAR PEN: 10.0000 [IU] | PEN_INJECTOR | Freq: Every day | SUBCUTANEOUS | 11 refills | Status: DC

## 2015-11-30 NOTE — Patient Instructions (Signed)
Thank you so much for coming to visit today! We will increase your Lantus to 15units at night. Please continue your Metformin. Please check your blood sugar once a day and write down your results. Please alternate days of checking it in the morning before eating and checking an hour after your largest meal of the day.  Please return in 1 month. Please bring the results you write down and your glucometer to your next office visit.  Dr. Gerlean Ren

## 2015-12-01 NOTE — Assessment & Plan Note (Addendum)
-   No blood sugar logs, but reports only checking fasting with levels in upper 200s, lower 300s. - Increase Lantus to 15units daily - Continue Metformin. Does not wish to titrate up, but will continue to consider at future visits. - Recommend checking blood sugar fasting and one hour after the largest meal of the day instead of only fasting. To write down readings and bring the log as well as the glucometer to the next office visit - Follow up with Ophthalmology as scheduled. Follow up with Dr. Sharol Given for diabetic ulcer management as scheduled - Follow up at [facility] in one month

## 2015-12-06 ENCOUNTER — Encounter (HOSPITAL_COMMUNITY): Payer: Self-pay

## 2015-12-06 ENCOUNTER — Ambulatory Visit (HOSPITAL_COMMUNITY)
Admission: RE | Admit: 2015-12-06 | Discharge: 2015-12-06 | Disposition: A | Discharge status: Home / Self Care | Payer: Medicaid Other | Source: Ambulatory Visit | Attending: Cardiology | Admitting: Cardiology

## 2015-12-06 VITALS — BP 167/104 | HR 76 | Resp 18 | Wt 190.4 lb

## 2015-12-06 DIAGNOSIS — Z794 Long term (current) use of insulin: Secondary | ICD-10-CM

## 2015-12-06 DIAGNOSIS — I739 Peripheral vascular disease, unspecified: Secondary | ICD-10-CM

## 2015-12-06 DIAGNOSIS — I10 Essential (primary) hypertension: Secondary | ICD-10-CM

## 2015-12-06 DIAGNOSIS — I428 Other cardiomyopathies: Secondary | ICD-10-CM | POA: Diagnosis not present

## 2015-12-06 DIAGNOSIS — Z89511 Acquired absence of right leg below knee: Secondary | ICD-10-CM | POA: Insufficient documentation

## 2015-12-06 DIAGNOSIS — E1142 Type 2 diabetes mellitus with diabetic polyneuropathy: Secondary | ICD-10-CM | POA: Diagnosis not present

## 2015-12-06 DIAGNOSIS — E1151 Type 2 diabetes mellitus with diabetic peripheral angiopathy without gangrene: Secondary | ICD-10-CM | POA: Insufficient documentation

## 2015-12-06 DIAGNOSIS — E1165 Type 2 diabetes mellitus with hyperglycemia: Secondary | ICD-10-CM | POA: Diagnosis not present

## 2015-12-06 DIAGNOSIS — I130 Hypertensive heart and chronic kidney disease with heart failure and stage 1 through stage 4 chronic kidney disease, or unspecified chronic kidney disease: Secondary | ICD-10-CM | POA: Diagnosis not present

## 2015-12-06 DIAGNOSIS — E1140 Type 2 diabetes mellitus with diabetic neuropathy, unspecified: Secondary | ICD-10-CM | POA: Diagnosis not present

## 2015-12-06 DIAGNOSIS — N183 Chronic kidney disease, stage 3 (moderate): Secondary | ICD-10-CM | POA: Diagnosis not present

## 2015-12-06 DIAGNOSIS — IMO0002 Reserved for concepts with insufficient information to code with codable children: Secondary | ICD-10-CM

## 2015-12-06 DIAGNOSIS — E1122 Type 2 diabetes mellitus with diabetic chronic kidney disease: Secondary | ICD-10-CM | POA: Insufficient documentation

## 2015-12-06 DIAGNOSIS — I5022 Chronic systolic (congestive) heart failure: Secondary | ICD-10-CM | POA: Diagnosis present

## 2015-12-06 DIAGNOSIS — I5042 Chronic combined systolic (congestive) and diastolic (congestive) heart failure: Secondary | ICD-10-CM

## 2015-12-06 LAB — BASIC METABOLIC PANEL
Anion gap: 11 (ref 5–15)
BUN: 30 mg/dL — ABNORMAL HIGH (ref 6–20)
CO2: 24 mmol/L (ref 22–32)
Calcium: 9 mg/dL (ref 8.9–10.3)
Chloride: 101 mmol/L (ref 101–111)
Creatinine, Ser: 1.27 mg/dL — ABNORMAL HIGH (ref 0.61–1.24)
GFR calc Af Amer: >60 mL/min (ref >60)
GFR calc non Af Amer: >60 mL/min (ref >60)
Glucose, Bld: 221 mg/dL — ABNORMAL HIGH (ref 65–99)
Potassium: 4 mmol/L (ref 3.5–5.1)
Sodium: 136 mmol/L (ref 135–145)

## 2015-12-06 MED ORDER — SPIRONOLACTONE 25 MG PO TABS
12.5000 mg | ORAL_TABLET | Freq: Every day | ORAL | 3 refills | Status: DC
Start: 2015-12-06 — End: 2016-01-10

## 2015-12-06 NOTE — Progress Notes (Signed)
[name] indicated he has an upcoming visit with  paramedicine tomorrow he has also had a med change within clinic today. CSW contacted paramedicine to inform of med change for visit tomorrow. Pt also indicated that he is having difficulties with food at the end of the month and his sister is looking into food pantry services as a resource to use. Pt denies any other concerns and will continue with paramedicine program. CSW will continue to provide services as needed. [name], LCSW [phone]

## 2015-12-06 NOTE — Patient Instructions (Signed)
Routine lab work today. Will notify you of abnormal results, otherwise no news is good news!  Return on Friday morning 12/16/2015 for repeat labs.  START Spironolactone 12.5 mg (1/2 tab) once daily.  Follow up 3 weeks with Darrick Grinder, Certified Nurse Practitioner.  Do the following things EVERYDAY: 1) Weigh yourself in the morning before breakfast. Write it down and keep it in a log. 2) Take your medicines as prescribed 3) Eat low salt foods-Limit salt (sodium) to 2000 mg per day.  4) Stay as active as you can everyday 5) Limit all fluids for the day to less than 2 liters

## 2015-12-06 NOTE — Progress Notes (Signed)
Patient ID: [name], male   DOB: Sep 21, 1955, 60 y.o.   MRN: [id number]   Advanced Heart Failure Clinic Note   PCP:  Ethelda Chick, DO Vascular: Dr. Sharol Given HF: Dr. Haroldine Laws   HPI: [name] is a 60 year old male with a past medical history of HTN, DM, :PAD S/p right BKA with non-haling wound, admitted for acute HF in 5/17// Echo on 07/10/15 showed EF of 15%, diffuse hypokinesis, PA pressure elevated at 54mmHg.   Was hospitalized 5/5 - 07/20/15  Here for post hospital f/u. Diuresed 42 pounds 244->202  Admitted 8/3 through 10/10/2015 with marked volume overload in the setting of medication noncompliance. Medication regimen was simplified. Diuresed with IV lasix and transitioned to 80 mg lasix daily. Discharge weight 200 pounds.   He returns regular follow up.  Weight difference of 16 lbs from last visit, but today he is not wearing his prosthesis. Weight at home stable ~ 200 lbs.  Says he forgets his medications one to two times a week.  Denies SOB with ADLs.  Not SOB walking out to mailbox. Hasn't been checking pressures at home. Paramedicine. Lives alone.  Has trouble getting food towards the end of the month. Sisters drives him, otherwise has no transportation.   Cath 07/13/15  Cardiac Cath Findings: Ao = 135/87 (107) LV = 141/20/30 RA = 17 RV = 67/11/20 PA = 62/29 (41) PCW = 35 (v wave 45-50) Fick cardiac output/index = 6.0/2.7 PVR = 1.0 WU SVR = 1198  FA sat = 88% PA sat = 58%, 57%  Labs (5/17): K 4.9 Cr 1.44 Labs (09/19/2015) : K 3.9 Creatinine 1.06 Labs 10/2015: Hgb A1C 13   Past Medical History:  Diagnosis Date  . Acute on chronic systolic heart failure, NYHA class 3 ([place])   . AKI (acute kidney injury) ([place])   . Anemia   . BKA stump complication ([place])   . Blind left eye   . Cardiomyopathy, dilated ([place])   . Combined systolic and diastolic congestive heart failure ([place])   . Congestive dilated cardiomyopathy ([place]) 07/11/2015  . Diabetes mellitus   . Diabetic neuropathy  associated with type 2 diabetes mellitus ([place])   . Hypertension   . Noncompliance with medications   . Open knee wound 10/2015   on rt bka  . PAD (peripheral artery disease) ([place])   . Protein calorie malnutrition ([place])   . Shortness of breath dyspnea     Current Outpatient Prescriptions  Medication Sig Dispense Refill  . ACCU-CHEK AVIVA PLUS test strip CHECK SUGAR 3 TIMES DAILY 100 each 3  . atorvastatin (LIPITOR) 40 MG tablet Take 1 tablet (40 mg total) by mouth daily. 90 tablet 1  . brimonidine (ALPHAGAN) 0.2 % ophthalmic solution PLACE 1 DROP INTO THE LEFT EYE 2 (TWO) TIMES DAILY. 5 mL 0  . carvedilol (COREG) 12.5 MG tablet Take 1 tablet (12.5 mg total) by mouth 2 (two) times daily with a meal. 60 tablet 6  . dorzolamide-timolol (COSOPT) 22.3-6.8 MG/ML ophthalmic solution Place 1 drop into the left eye 2 (two) times daily. 10 mL 12  . furosemide (LASIX) 40 MG tablet Take 2 tablets (80 mg total) by mouth daily. 60 tablet 6  . glucose blood (ACCU-CHEK ACTIVE STRIPS) test strip Use as instructed 100 each 12  . Insulin Glargine (LANTUS) 100 UNIT/ML Solostar Pen Inject 10 Units into the skin daily at 10 pm. 15 mL 11  . metFORMIN (GLUCOPHAGE) 500 MG tablet Take 1 tablet (500 mg  total) by mouth 2 (two) times daily with a meal. 60 tablet 6  . Multiple Vitamins-Minerals (MULTIVITAMIN WITH MINERALS) tablet Take 1 tablet by mouth daily.    . potassium chloride SA (K-DUR,KLOR-CON) 20 MEQ tablet Take 2 tablets (40 mEq total) by mouth daily. 60 tablet 6  . sacubitril-valsartan (ENTRESTO) 97-103 MG Take 1 tablet by mouth 2 (two) times daily. 60 tablet 6   No current facility-administered medications for this encounter.     No Known Allergies    Social History   Social History  . Marital status: Married    Spouse name: N/A  . Number of children: N/A  . Years of education: N/A   Occupational History  . Not on file.   Social History Main Topics  . Smoking status: Former Smoker    Quit  date: 05/10/2000  . Smokeless tobacco: Former Systems developer    Quit date: 02/22/2000  . Alcohol use No  . Drug use: No  . Sexual activity: Not on file   Other Topics Concern  . Not on file   Social History Narrative   ** Merged History Encounter **       Lives alone.       Family History  Problem Relation Age of Onset  . Cancer Mother   . Peripheral vascular disease Father     Vitals:   12/06/15 1357  BP: (!) 167/104  Pulse: 76  Resp: 18  SpO2: 97%  Weight: 190 lb 6 oz (86.4 kg)   Wt Readings from Last 3 Encounters:  12/06/15 190 lb 6 oz (86.4 kg)  11/30/15 199 lb 12.8 oz (90.6 kg)  11/17/15 206 lb (93.4 kg)     PHYSICAL EXAM: General: Rolled into clinic on rollator (didn't bring prosthesis) HEENT: normal Neck: supple. JVD not elevated. Carotids 2+ bilat; no bruits. No thyromegaly or nodule noted.  Cor: PMI nondisplaced. RRR. No rubs, or murmurs.+ S3 Lungs: CTAB, normal effort Abdomen: soft, NT, ND, no HSM. No bruits or masses. +BS  Extremities: no cyanosis, clubbing, rash, edema. RLE BKA.  Neuro: alert & oriented x 3, cranial nerves grossly intact. moves all 4 extremities w/o difficulty. Affect pleasant.   ASSESSMENT & PLAN: 1. Chronic systolic HF due to NICM. 07/2015 ECHo EF 15%. Plan to repeat ECHO after HF meds optimized.  NYHA II-III.  -Volume status stable. Continue 80 mg lasix daily + 40 meq potassium daily.  - Continue Coreg 12.5 mg BID - Continue entresto 97-103 mg BID. - Add spiro 12.5 mg. Will check BMET next week and Bring back for close follow up in 2-3 weeks. Reinforced daily weights, low salt food choices, and medication compliance.   2. HTN - Elevated having not taken his morning meds.  - Adding spiro as above.  - Can eventually consider amlodipine if not improved.  3. DM2, poorly controlled- Hgb A1C 13.  - PCP following. Sees again 12/28/15.    4. PAD s/p R BKA- Followed by Dr Sharol Given.  5. CKD stage 3 - Reviewed BMET from 10/19/2015 6. Social- Tree surgeon.   Add spiro as above, doing somewhat better with compliance with paramedicine.  Though has some gaps in his weekly med box.   Satira Mccallum Tillery PA-C  2:16 PM  Total time spent > 25 minutes. Over half that spent discussing the above.

## 2015-12-09 ENCOUNTER — Telehealth (HOSPITAL_COMMUNITY): Payer: Self-pay | Admitting: Surgery

## 2015-12-09 ENCOUNTER — Telehealth (HOSPITAL_COMMUNITY): Payer: Self-pay

## 2015-12-09 NOTE — Telephone Encounter (Signed)
[name] with paramedicine left VM on mutual patient after hours on CHF clinic triage line last night stating patient had what he described as worsening left sided weakness.  [name] states patient was covered in drink that patient had spilled on himself, and patient reported dropping things more frequently. [name] advised patient to go to ED for evaluation and treatment, but patient adamantly refused. [name] also reports patient has forgotten to take medications for the past 3 days. Attempted to reach out to the patient this morning, no answer. Per Oda Kilts PA-C, will continue to try to reach patient to see if he will be agreeable to go to ED and see what barriers are keeping patient from taking his medications.  Renee Pain, RN

## 2015-12-09 NOTE — Telephone Encounter (Signed)
Thought call was from this morning. If from last night, pt needs well check today. If unable to reach by phone, may need to call Non-Emergency EMS number or contact Bradd Canary over the Paramedicine program to see if he can arrange.   Legrand Como [id number]" [place], PA-C 12/09/2015 10:47 AM

## 2015-12-09 NOTE — Telephone Encounter (Signed)
I called Charlesetta Shanks as [name] is off today concerning Mr. Peeters.  He is going to go check on Mr Baltazar at his home considering the phone call we received last night regarding his increase in weakness and the fact that he has been unreachable by phone this AM.

## 2015-12-14 ENCOUNTER — Ambulatory Visit (INDEPENDENT_AMBULATORY_CARE_PROVIDER_SITE_OTHER): Payer: Medicaid Other | Admitting: Orthopedic Surgery

## 2015-12-14 DIAGNOSIS — Z89511 Acquired absence of right leg below knee: Secondary | ICD-10-CM | POA: Diagnosis not present

## 2015-12-14 DIAGNOSIS — E1142 Type 2 diabetes mellitus with diabetic polyneuropathy: Secondary | ICD-10-CM | POA: Diagnosis not present

## 2015-12-16 ENCOUNTER — Ambulatory Visit (HOSPITAL_COMMUNITY)
Admission: RE | Admit: 2015-12-16 | Discharge: 2015-12-16 | Disposition: A | Discharge status: Home / Self Care | Payer: Medicaid Other | Source: Ambulatory Visit | Attending: Cardiology | Admitting: Cardiology

## 2015-12-16 DIAGNOSIS — I5023 Acute on chronic systolic (congestive) heart failure: Secondary | ICD-10-CM

## 2015-12-16 DIAGNOSIS — I5042 Chronic combined systolic (congestive) and diastolic (congestive) heart failure: Secondary | ICD-10-CM | POA: Diagnosis not present

## 2015-12-16 LAB — BASIC METABOLIC PANEL
Anion gap: 9 (ref 5–15)
BUN: 24 mg/dL — ABNORMAL HIGH (ref 6–20)
CO2: 25 mmol/L (ref 22–32)
Calcium: 9.4 mg/dL (ref 8.9–10.3)
Chloride: 104 mmol/L (ref 101–111)
Creatinine, Ser: 1.27 mg/dL — ABNORMAL HIGH (ref 0.61–1.24)
GFR calc Af Amer: >60 mL/min (ref >60)
GFR calc non Af Amer: >60 mL/min (ref >60)
Glucose, Bld: 202 mg/dL — ABNORMAL HIGH (ref 65–99)
Potassium: 4.3 mmol/L (ref 3.5–5.1)
Sodium: 138 mmol/L (ref 135–145)

## 2015-12-27 ENCOUNTER — Ambulatory Visit (HOSPITAL_COMMUNITY)
Admission: RE | Admit: 2015-12-27 | Discharge: 2015-12-27 | Disposition: A | Discharge status: Home / Self Care | Payer: Medicaid Other | Source: Ambulatory Visit | Attending: Cardiology | Admitting: Cardiology

## 2015-12-27 VITALS — BP 132/90 | HR 71 | Wt 190.0 lb

## 2015-12-27 DIAGNOSIS — Z9114 Patient's other noncompliance with medication regimen: Secondary | ICD-10-CM | POA: Diagnosis not present

## 2015-12-27 DIAGNOSIS — I130 Hypertensive heart and chronic kidney disease with heart failure and stage 1 through stage 4 chronic kidney disease, or unspecified chronic kidney disease: Secondary | ICD-10-CM | POA: Insufficient documentation

## 2015-12-27 DIAGNOSIS — I5042 Chronic combined systolic (congestive) and diastolic (congestive) heart failure: Secondary | ICD-10-CM | POA: Diagnosis present

## 2015-12-27 DIAGNOSIS — I10 Essential (primary) hypertension: Secondary | ICD-10-CM

## 2015-12-27 DIAGNOSIS — I517 Cardiomegaly: Secondary | ICD-10-CM | POA: Diagnosis not present

## 2015-12-27 DIAGNOSIS — E1122 Type 2 diabetes mellitus with diabetic chronic kidney disease: Secondary | ICD-10-CM | POA: Diagnosis not present

## 2015-12-27 DIAGNOSIS — R0602 Shortness of breath: Secondary | ICD-10-CM | POA: Diagnosis not present

## 2015-12-27 DIAGNOSIS — I444 Left anterior fascicular block: Secondary | ICD-10-CM | POA: Insufficient documentation

## 2015-12-27 DIAGNOSIS — E1142 Type 2 diabetes mellitus with diabetic polyneuropathy: Secondary | ICD-10-CM

## 2015-12-27 DIAGNOSIS — E1165 Type 2 diabetes mellitus with hyperglycemia: Secondary | ICD-10-CM | POA: Diagnosis not present

## 2015-12-27 DIAGNOSIS — Z794 Long term (current) use of insulin: Secondary | ICD-10-CM

## 2015-12-27 DIAGNOSIS — Z89511 Acquired absence of right leg below knee: Secondary | ICD-10-CM | POA: Insufficient documentation

## 2015-12-27 DIAGNOSIS — N183 Chronic kidney disease, stage 3 unspecified: Secondary | ICD-10-CM

## 2015-12-27 DIAGNOSIS — Z87891 Personal history of nicotine dependence: Secondary | ICD-10-CM | POA: Insufficient documentation

## 2015-12-27 DIAGNOSIS — I739 Peripheral vascular disease, unspecified: Secondary | ICD-10-CM | POA: Diagnosis not present

## 2015-12-27 DIAGNOSIS — E1140 Type 2 diabetes mellitus with diabetic neuropathy, unspecified: Secondary | ICD-10-CM | POA: Insufficient documentation

## 2015-12-27 DIAGNOSIS — IMO0002 Reserved for concepts with insufficient information to code with codable children: Secondary | ICD-10-CM

## 2015-12-27 DIAGNOSIS — I493 Ventricular premature depolarization: Secondary | ICD-10-CM | POA: Diagnosis not present

## 2015-12-27 NOTE — Patient Instructions (Signed)
Your physician recommends that you schedule a follow-up appointment in: 2 weeks with Amy Clegg,NP  Do the following things EVERYDAY: 1) Weigh yourself in the morning before breakfast. Write it down and keep it in a log. 2) Take your medicines as prescribed 3) Eat low salt foods-Limit salt (sodium) to 2000 mg per day.  4) Stay as active as you can everyday 5) Limit all fluids for the day to less than 2 liters

## 2015-12-27 NOTE — Addendum Note (Signed)
Encounter addended by: Louann Liv, LCSW on: 12/27/2015  3:21 PM<BR>    Actions taken: Sign clinical note

## 2015-12-27 NOTE — Progress Notes (Signed)
CSW met with patient in the clinic. Patient is known to paramedicine program. Patient admitted to non compliance with medication management. Patient states he is fearful of returning symptoms of arm weakness and tingling pain .and states he is convinced it is caused by his medications. Patient states he takes "the big pills one day and then the small pills the next". Patient admitted to feeling well taking his pills every other day and CSW through supportive interventions encouraged him to try taking all the pills everyday. Patient reluctant but states he is willing to give it a try.  Patient's family member present and very supportive and states she will encourage as well. CSW will continue to follow and coordinate care with paramedicine program. [name], LCSW [phone]

## 2015-12-27 NOTE — Progress Notes (Signed)
Patient ID: [name], male   DOB: 03/21/1955, 60 y.o.   MRN: [id number]   Advanced Heart Failure Clinic Note   PCP:  Ethelda Chick, DO Vascular: Dr. Sharol Given HF: Dr. Haroldine Laws   HPI: Mr. [name] is a 60 year old male with a past medical history of HTN, DM, :PAD S/p right BKA with non-haling wound, admitted for acute HF in 5/17// Echo on 07/10/15 showed EF of 15%, diffuse hypokinesis, PA pressure elevated at 102mHg.   Was hospitalized 5/5 - 07/20/15  Here for post hospital f/u. Diuresed 42 pounds 244->202  Admitted 8/3 through 10/10/2015 with marked volume overload in the setting of medication noncompliance. Medication regimen was simplified. Diuresed with IV lasix and transitioned to 80 mg lasix daily. Discharge weight 200 pounds.   He returns for HF follow up. He frequently misses medications multiple days in a row. Says he is taking big pills one day and little pills the next day. Not using insulin as scheduled. Does not have many groceries at home. Denies SOB /PND/Orthopnea. Paramedicine following. Weight at home 195-200 pounds. . Lives alone.  Has trouble getting food towards the end of the month. Sister drives him to appointments.    Cath 07/13/15  Cardiac Cath Findings: Ao = 135/87 (107) LV = 141/20/30 RA = 17 RV = 67/11/20 PA = 62/29 (41) PCW = 35 (v wave 45-50) Fick cardiac output/index = 6.0/2.7 PVR = 1.0 WU SVR = 1198  FA sat = 88% PA sat = 58%, 57%  Labs (5/17): K 4.9 Cr 1.44 Labs (09/19/2015) : K 3.9 Creatinine 1.06 Labs 10/2015: Hgb A1C 13 Labs 12/16/2015: K 4.3 Creatinine 1.27    Past Medical History:  Diagnosis Date  . Acute on chronic systolic heart failure, NYHA class 3 ([place]   . AKI (acute kidney injury) ([place]   . Anemia   . BKA stump complication ([place]   . Blind left eye   . Cardiomyopathy, dilated ([place]   . Combined systolic and diastolic congestive heart failure ([place]   . Congestive dilated cardiomyopathy ([place] 07/11/2015  . Diabetes mellitus   . Diabetic  neuropathy associated with type 2 diabetes mellitus ([place]   . Hypertension   . Noncompliance with medications   . Open knee wound 10/2015   on rt bka  . PAD (peripheral artery disease) ([facility]   . Protein calorie malnutrition ([place]   . Shortness of breath dyspnea     Current Outpatient Prescriptions  Medication Sig Dispense Refill  . ACCU-CHEK AVIVA PLUS test strip CHECK SUGAR 3 TIMES DAILY 100 each 3  . atorvastatin (LIPITOR) 40 MG tablet Take 1 tablet (40 mg total) by mouth daily. 90 tablet 1  . brimonidine (ALPHAGAN) 0.2 % ophthalmic solution PLACE 1 DROP INTO THE LEFT EYE 2 (TWO) TIMES DAILY. 5 mL 0  . carvedilol (COREG) 12.5 MG tablet Take 1 tablet (12.5 mg total) by mouth 2 (two) times daily with a meal. 60 tablet 6  . dorzolamide-timolol (COSOPT) 22.3-6.8 MG/ML ophthalmic solution Place 1 drop into the left eye 2 (two) times daily. 10 mL 12  . furosemide (LASIX) 40 MG tablet Take 2 tablets (80 mg total) by mouth daily. 60 tablet 6  . glucose blood (ACCU-CHEK ACTIVE STRIPS) test strip Use as instructed 100 each 12  . Insulin Glargine (LANTUS) 100 UNIT/ML Solostar Pen Inject 10 Units into the skin daily at 10 pm. 15 mL 11  . metFORMIN (GLUCOPHAGE) 500 MG tablet Take 1 tablet (500 mg total) by  mouth 2 (two) times daily with a meal. 60 tablet 6  . Multiple Vitamins-Minerals (MULTIVITAMIN WITH MINERALS) tablet Take 1 tablet by mouth daily.    . potassium chloride SA (K-DUR,KLOR-CON) 20 MEQ tablet Take 2 tablets (40 mEq total) by mouth daily. 60 tablet 6  . sacubitril-valsartan (ENTRESTO) 97-103 MG Take 1 tablet by mouth 2 (two) times daily. 60 tablet 6  . spironolactone (ALDACTONE) 25 MG tablet Take 0.5 tablets (12.5 mg total) by mouth daily. 15 tablet 3   No current facility-administered medications for this encounter.     No Known Allergies    Social History   Social History  . Marital status: Married    Spouse name: N/A  . Number of children: N/A  . Years of education: N/A    Occupational History  . Not on file.   Social History Main Topics  . Smoking status: Former Smoker    Quit date: 05/10/2000  . Smokeless tobacco: Former Systems developer    Quit date: 02/22/2000  . Alcohol use No  . Drug use: No  . Sexual activity: Not on file   Other Topics Concern  . Not on file   Social History Narrative   ** Merged History Encounter **       Lives alone.       Family History  Problem Relation Age of Onset  . Cancer Mother   . Peripheral vascular disease Father     Vitals:   12/27/15 1437  BP: 132/90  Pulse: 71  SpO2: 98%  Weight: 190 lb (86.2 kg)   Wt Readings from Last 3 Encounters:  12/06/15 190 lb 6 oz (86.4 kg)  11/30/15 199 lb 12.8 oz (90.6 kg)  11/17/15 206 lb (93.4 kg)     PHYSICAL EXAM: General: Arrived in wheelchair.  HEENT: normal Neck: supple. JVD flat. Carotids 2+ bilat; no bruits. No thyromegaly or nodule noted.  Cor: PMI nondisplaced. RRR. No rubs, or murmurs.+ S3 Lungs: CTAB, normal effort Abdomen: soft, NT, ND, no HSM. No bruits or masses. +BS  Extremities: no cyanosis, clubbing, rash, edema. RLE BKA.  Neuro: alert & oriented x 3, cranial nerves grossly intact. moves all 4 extremities w/o difficulty. Affect pleasant.  EKG: NSR with PVCs 70 bpm   ASSESSMENT & PLAN: 1. Chronic systolic HF due to NICM. 07/2015 ECHo EF 15%. Plan to repeat ECHO after HF meds optimized.  NYHA II-III. -Volume status stable. Continue 80 mg lasix daily + 40 meq potassium daily.  - Continue Coreg 12.5 mg BID - Continue entresto 97-103 mg BID. -Continue spiro 12.5 mg.  Reinforced daily weights, low salt food choices, and medication compliance.   2. HTN - Elevated but has not been taking medications consistently.   3. DM2, poorly controlled- Hgb A1C 13.  - PCP following. Sees again 12/28/15.   4. PAD s/p R BKA- Followed by Dr Sharol Given.  5. CKD stage 3 - Reviewed BMET from 12/16/2015 6. Social- Geophysicist/field seismologist.   Lengthy discussion about medications  compliance. I am not going to up titrate HF meds because I am not sure what he is taking. He intermittently takes his medications.HFSW and Paramedicine met with him today.  He will need close follow up for HF. He also need better diabetes compliance.  Follow up in 2 weeks .   Amy Clegg NP-C  2:27 PM

## 2015-12-27 NOTE — Progress Notes (Signed)
Advanced Heart Failure Medication Review by a Pharmacist  Does the patient  feel that his/her medications are working for him/her?  no  Has the patient been experiencing any side effects to the medications prescribed?  no  Does the patient measure his/her own blood pressure or blood glucose at home?  yes   Does the patient have any problems obtaining medications due to transportation or finances?   no  Understanding of regimen: poor Understanding of indications: poor Potential of compliance: poor Patient understands to avoid NSAIDs. Patient understands to avoid decongestants.  Issues to address at subsequent visits: Compliance   Pharmacist comments:  [name] is a pleasant 60 yo M presenting with his sister and [name] Journalist, newspaper). He admits to poor compliance with his regimen 2/2 not feeling like the medicines are working for him and thinking that they may be making him feel worse. We had a thorough discussion about the importance of taking his medications as prescribed and he verbalized understanding but still didn't seem very convinced. Will continue to work with him on this and simplify his regimen as much as possible.   Ruta Hinds. Velva Harman, PharmD, BCPS, CPP Clinical Pharmacist Pager: [phone] Phone: [phone] 12/27/2015 2:32 PM      Time with patient: 10 minutes Preparation and documentation time: 2 minutes Total time: 12 minutes

## 2015-12-28 ENCOUNTER — Ambulatory Visit: Payer: Medicaid Other | Admitting: Internal Medicine

## 2016-01-10 ENCOUNTER — Ambulatory Visit (HOSPITAL_COMMUNITY)
Admission: RE | Admit: 2016-01-10 | Discharge: 2016-01-10 | Disposition: A | Discharge status: Home / Self Care | Payer: Medicaid Other | Source: Ambulatory Visit | Attending: Internal Medicine | Admitting: Internal Medicine

## 2016-01-10 VITALS — BP 142/90 | HR 78 | Wt 193.4 lb

## 2016-01-10 DIAGNOSIS — N183 Chronic kidney disease, stage 3 unspecified: Secondary | ICD-10-CM

## 2016-01-10 DIAGNOSIS — I420 Dilated cardiomyopathy: Secondary | ICD-10-CM | POA: Insufficient documentation

## 2016-01-10 DIAGNOSIS — I10 Essential (primary) hypertension: Secondary | ICD-10-CM

## 2016-01-10 DIAGNOSIS — Z794 Long term (current) use of insulin: Secondary | ICD-10-CM | POA: Diagnosis not present

## 2016-01-10 DIAGNOSIS — IMO0002 Reserved for concepts with insufficient information to code with codable children: Secondary | ICD-10-CM

## 2016-01-10 DIAGNOSIS — I1310 Hypertensive heart and chronic kidney disease without heart failure, with stage 1 through stage 4 chronic kidney disease, or unspecified chronic kidney disease: Secondary | ICD-10-CM | POA: Diagnosis present

## 2016-01-10 DIAGNOSIS — I739 Peripheral vascular disease, unspecified: Secondary | ICD-10-CM | POA: Insufficient documentation

## 2016-01-10 DIAGNOSIS — R0602 Shortness of breath: Secondary | ICD-10-CM | POA: Insufficient documentation

## 2016-01-10 DIAGNOSIS — Z87891 Personal history of nicotine dependence: Secondary | ICD-10-CM | POA: Diagnosis not present

## 2016-01-10 DIAGNOSIS — E1165 Type 2 diabetes mellitus with hyperglycemia: Secondary | ICD-10-CM

## 2016-01-10 DIAGNOSIS — E1142 Type 2 diabetes mellitus with diabetic polyneuropathy: Secondary | ICD-10-CM | POA: Diagnosis not present

## 2016-01-10 DIAGNOSIS — I5022 Chronic systolic (congestive) heart failure: Secondary | ICD-10-CM | POA: Diagnosis present

## 2016-01-10 DIAGNOSIS — E1122 Type 2 diabetes mellitus with diabetic chronic kidney disease: Secondary | ICD-10-CM | POA: Diagnosis not present

## 2016-01-10 DIAGNOSIS — E1140 Type 2 diabetes mellitus with diabetic neuropathy, unspecified: Secondary | ICD-10-CM | POA: Insufficient documentation

## 2016-01-10 DIAGNOSIS — Z9114 Patient's other noncompliance with medication regimen: Secondary | ICD-10-CM | POA: Insufficient documentation

## 2016-01-10 DIAGNOSIS — I5042 Chronic combined systolic (congestive) and diastolic (congestive) heart failure: Secondary | ICD-10-CM | POA: Diagnosis not present

## 2016-01-10 DIAGNOSIS — Z89511 Acquired absence of right leg below knee: Secondary | ICD-10-CM | POA: Diagnosis present

## 2016-01-10 MED ORDER — SPIRONOLACTONE 25 MG PO TABS: 25.0000 mg | ORAL_TABLET | Freq: Every day | ORAL | 3 refills | Status: DC

## 2016-01-10 NOTE — Patient Instructions (Signed)
INCREASE Spironolactone to 25 mg (1 whole tablet) once daily.  Follow up 2 weeks with Darrick Grinder NP-C with lab work.  Do the following things EVERYDAY: 1) Weigh yourself in the morning before breakfast. Write it down and keep it in a log. 2) Take your medicines as prescribed 3) Eat low salt foods-Limit salt (sodium) to 2000 mg per day.  4) Stay as active as you can everyday 5) Limit all fluids for the day to less than 2 liters

## 2016-01-10 NOTE — Progress Notes (Signed)
Patient ID: [name], male   DOB: 02-Aug-1955, 60 y.o.   MRN: [id number]   Advanced Heart Failure Clinic Note   PCP:  Ethelda Chick, DO Vascular: Dr. Sharol Given HF: Dr. Haroldine Laws   HPI: [name] is a 60 year old male with a past medical history of HTN, DM, :PAD S/p right BKA with non-haling wound, admitted for acute HF in 5/17// Echo on 07/10/15 showed EF of 15%, diffuse hypokinesis, PA pressure elevated at 63mmHg.   Was hospitalized 5/5 - 07/20/15  Here for post hospital f/u. Diuresed 42 pounds 244->202  Admitted 8/3 through 10/10/2015 with marked volume overload in the setting of medication noncompliance. Medication regimen was simplified. Diuresed with IV lasix and transitioned to 80 mg lasix daily. Discharge weight 200 pounds.   He returns for HF follow up. Last visit no medication changes were made due to medication noncompliance. Overall feeling ok. Weight at home down to 193 pounds. Has difficulty taking insulin because he cant see the numbers on the syringe. Taking medications most often. Receives food from [employer].  Paramedicine following. Weight at home 195-200 pounds. . Lives alone.  Has trouble getting food towards the end of the month. Sister drives him to appointments.  His sister pays his bills.   Cath 07/13/15  Cardiac Cath Findings: Ao = 135/87 (107) LV = 141/20/30 RA = 17 RV = 67/11/20 PA = 62/29 (41) PCW = 35 (v wave 45-50) Fick cardiac output/index = 6.0/2.7 PVR = 1.0 WU SVR = 1198  FA sat = 88% PA sat = 58%, 57%  Labs (5/17): K 4.9 Cr 1.44 Labs (09/19/2015) : K 3.9 Creatinine 1.06 Labs 10/2015: Hgb A1C 13 Labs 12/16/2015: K 4.3 Creatinine 1.27    Past Medical History:  Diagnosis Date  . Acute on chronic systolic heart failure, NYHA class 3 ([place])   . AKI (acute kidney injury) ([place])   . Anemia   . BKA stump complication ([place])   . Blind left eye   . Cardiomyopathy, dilated ([place])   . Combined systolic and diastolic congestive heart failure ([place])   .  Congestive dilated cardiomyopathy ([place]) 07/11/2015  . Diabetes mellitus   . Diabetic neuropathy associated with type 2 diabetes mellitus ([place])   . Hypertension   . Noncompliance with medications   . Open knee wound 10/2015   on rt bka  . PAD (peripheral artery disease) ([place])   . Protein calorie malnutrition ([place])   . Shortness of breath dyspnea     Current Outpatient Prescriptions  Medication Sig Dispense Refill  . ACCU-CHEK AVIVA PLUS test strip CHECK SUGAR 3 TIMES DAILY 100 each 3  . atorvastatin (LIPITOR) 40 MG tablet Take 1 tablet (40 mg total) by mouth daily. 90 tablet 1  . brimonidine (ALPHAGAN) 0.2 % ophthalmic solution PLACE 1 DROP INTO THE LEFT EYE 2 (TWO) TIMES DAILY. 5 mL 0  . carvedilol (COREG) 12.5 MG tablet Take 1 tablet (12.5 mg total) by mouth 2 (two) times daily with a meal. 60 tablet 6  . dorzolamide-timolol (COSOPT) 22.3-6.8 MG/ML ophthalmic solution Place 1 drop into the left eye 2 (two) times daily. 10 mL 12  . furosemide (LASIX) 40 MG tablet Take 2 tablets (80 mg total) by mouth daily. 60 tablet 6  . glucose blood (ACCU-CHEK ACTIVE STRIPS) test strip Use as instructed 100 each 12  . Insulin Glargine (LANTUS) 100 UNIT/ML Solostar Pen Inject 10 Units into the skin daily at 10 pm. 15 mL 11  . metFORMIN (  GLUCOPHAGE) 500 MG tablet Take 1 tablet (500 mg total) by mouth 2 (two) times daily with a meal. 60 tablet 6  . potassium chloride SA (K-DUR,KLOR-CON) 20 MEQ tablet Take 2 tablets (40 mEq total) by mouth daily. 60 tablet 6  . sacubitril-valsartan (ENTRESTO) 97-103 MG Take 1 tablet by mouth 2 (two) times daily. 60 tablet 6  . spironolactone (ALDACTONE) 25 MG tablet Take 0.5 tablets (12.5 mg total) by mouth daily. 15 tablet 3  . Multiple Vitamins-Minerals (MULTIVITAMIN WITH MINERALS) tablet Take 1 tablet by mouth daily.     No current facility-administered medications for this encounter.     No Known Allergies    Social History   Social History  . Marital status:  Married    Spouse name: N/A  . Number of children: N/A  . Years of education: N/A   Occupational History  . Not on file.   Social History Main Topics  . Smoking status: Former Smoker    Quit date: 05/10/2000  . Smokeless tobacco: Former Systems developer    Quit date: 02/22/2000  . Alcohol use No  . Drug use: No  . Sexual activity: Not on file   Other Topics Concern  . Not on file   Social History Narrative   ** Merged History Encounter **       Lives alone.       Family History  Problem Relation Age of Onset  . Cancer Mother   . Peripheral vascular disease Father     Vitals:   01/10/16 1421  BP: (!) 142/90  Pulse: 78  SpO2: 96%  Weight: 193 lb 6.4 oz (87.7 kg)   Wt Readings from Last 3 Encounters:  01/10/16 193 lb 6.4 oz (87.7 kg)  12/27/15 190 lb (86.2 kg)  12/06/15 190 lb 6 oz (86.4 kg)     PHYSICAL EXAM: General: Arrived in wheelchair. Sister present.  HEENT: normal Neck: supple. JVD flat. Carotids 2+ bilat; no bruits. No thyromegaly or nodule noted.  Cor: PMI nondisplaced. RRR. No rubs, or murmurs.+ S3 Lungs: CTAB, normal effort Abdomen: soft, NT, ND, no HSM. No bruits or masses. +BS  Extremities: no cyanosis, clubbing, rash, edema. RLE BKA.  Neuro: alert & oriented x 3, cranial nerves grossly intact. moves all 4 extremities w/o difficulty. Affect pleasant.   ASSESSMENT & PLAN: 1. Chronic systolic HF due to NICM. 07/2015 ECHo EF 15%. Plan to repeat ECHO after HF meds optimized.  NYHA II-III. -Seems to be doing a little better. Volume status stable. Continue 80 mg lasix daily + 40 meq potassium daily.  - Continue Coreg 12.5 mg BID. Continue entresto 97-103 mg BID. -Increase spiro to 25 mg daily Reinforced daily weights, low salt food choices, and medication compliance.   2. HTN - Elevated but has not been taking medications consistently.   3. DM2, poorly controlled- Hgb A1C 13. Having difficulty taking insulin due to impaired vision.  - PCP following.   4. PAD  s/p R BKA- Followed by Dr Sharol Given.  5. CKD stage 3 - Reviewed BMET from 12/16/2015  6. Social- Geophysicist/field seismologist.    Follow up in 2 weeks . Plan to check BMET at that time.   Marithza Malachi NP-C  2:43 PM

## 2016-01-10 NOTE — Progress Notes (Signed)
CSW met with patient and sister in the clinic. Paramedic involved with patient and stated possible need for additional support at home and meal planning. CSW discussed various options for further assistance in the home although patient reports he does not want any aides or supportive services in the home. Patient reports his sister takes him food shopping and occasionally picks up bags of food from the local food shelters. Patient states he either boils or microwaves his meals and denies any need for help. CSW discussed possible application for CAP program which has a long waiting list and would possibly benefit patient in the future should he require additional services. Patient agreeable to pursue application over the next few visits. CSW will continue to follow and coordinate with paramedicine. [name], LCSW [phone]

## 2016-01-10 NOTE — Addendum Note (Signed)
Encounter addended by: Louann Liv, LCSW on: 01/10/2016  4:48 PM<BR>    Actions taken: Sign clinical note

## 2016-01-11 ENCOUNTER — Encounter (INDEPENDENT_AMBULATORY_CARE_PROVIDER_SITE_OTHER): Payer: Self-pay | Admitting: Orthopedic Surgery

## 2016-01-11 ENCOUNTER — Ambulatory Visit (INDEPENDENT_AMBULATORY_CARE_PROVIDER_SITE_OTHER): Payer: Medicaid Other | Admitting: Orthopedic Surgery

## 2016-01-11 VITALS — Ht 72.0 in | Wt 193.0 lb

## 2016-01-11 DIAGNOSIS — E1142 Type 2 diabetes mellitus with diabetic polyneuropathy: Secondary | ICD-10-CM | POA: Diagnosis not present

## 2016-01-11 DIAGNOSIS — Z89511 Acquired absence of right leg below knee: Secondary | ICD-10-CM

## 2016-01-11 DIAGNOSIS — L89892 Pressure ulcer of other site, stage 2: Secondary | ICD-10-CM | POA: Diagnosis not present

## 2016-01-11 NOTE — Progress Notes (Signed)
Wound Care Note   Patient: [name]           Date of Birth: 08-Jan-1956           MRN: [id number]             PCP: Steve Rattler, DO Visit Date: 01/11/2016   Assessment & Plan: Visit Diagnoses:  1. Decubitus ulcer of right knee, stage 2   2. Diabetic polyneuropathy associated with type 2 diabetes mellitus ([place])   3. History of right below knee amputation ([place])     Plan: [place] honey dressing applied today. We will get him set up with home health to have the same dressing changed 3 times weekly. Does currently have home health aides once weekly. But is unsure which agency. Continue minimizing weightbearing in prosthetic.  Follow-Up Instructions: Return in about 2 weeks (around 01/25/2016).  Orders:  No orders of the defined types were placed in this encounter.  No orders of the defined types were placed in this encounter.     Procedures: No notes on file   Clinical Data: No additional findings.   No images are attached to the encounter.   Subjective: Chief Complaint  Patient presents with  . Right Leg - Follow-up    Revision right transtibial amputation 05/01/15    Patient is 9 months s/p a right transtibial amputation. The incision is well healed but the tibial tuberosity has a quarter sized open area that is red and dry and swollen. No complaints of pain. The area is being covered with a silver sock strip and then a Vive shrinker. No questions or concersn today.     Review of Systems  Constitutional: Negative for chills and fever.  Skin: Positive for wound.     Objective: Vital Signs: Ht 6' (1.829 m)   Wt 193 lb (87.5 kg)   BMI 26.18 kg/m   Physical Exam: Ulcer to the right tibial tuberosity is stable. The wound is 2 cm in diameter and 2 mm deep. Wound bed flat and red. It is dry. Little granulation tissue. No drainage or sign of infection. Edges of wound bed are calloused these were hard with a #10 blade knife today.  Specialty Comments: No  specialty comments available.   PMFS History: Patient Active Problem List   Diagnosis Date Noted  . Chronic combined systolic and diastolic congestive heart failure ([place])   . Protein calorie malnutrition ([place]) 07/12/2015  . Congestive dilated cardiomyopathy ([place]) 07/11/2015  . Pressure ulcer 07/09/2015  . Open knee wound 07/06/2015  . Post-operative pain   . Complication of amputated stump ([place]) 04/29/2015  . History of right below knee amputation ([place]) 02/22/2015  . PAD (peripheral artery disease) ([place]) 02/22/2015  . Blindness of left eye 01/04/2015  . BKA stump complication ([place]) 0000000  . Diabetic neuropathy associated with type 2 diabetes mellitus ([place]) 12/01/2013  . Noncompliance with medications 07/13/2010  . OTHER SPEC TYPES SCHIZOPHRENIA UNSPEC CONDITION 05/24/2009  . Obesity, unspecified 06/09/2007  . DM (diabetes mellitus), type 2, uncontrolled ([place]) 05/02/2006  . Essential hypertension, benign 05/02/2006   Past Medical History:  Diagnosis Date  . Acute on chronic systolic heart failure, NYHA class 3 ([place])   . AKI (acute kidney injury) ([place])   . Anemia   . BKA stump complication ([place])   . Blind left eye   . Cardiomyopathy, dilated ([place])   . Combined systolic and diastolic congestive heart failure ([place])   . Congestive dilated cardiomyopathy ([place]) 07/11/2015  .  Diabetes mellitus   . Diabetic neuropathy associated with type 2 diabetes mellitus ([place])   . Hypertension   . Noncompliance with medications   . Open knee wound 10/2015   on rt bka  . PAD (peripheral artery disease) ([place])   . Protein calorie malnutrition ([place])   . Shortness of breath dyspnea     Family History  Problem Relation Age of Onset  . Cancer Mother   . Peripheral vascular disease Father    Past Surgical History:  Procedure Laterality Date  . AMPUTATION Right 09/26/2013   Procedure: AMPUTATION BELOW KNEE;  Surgeon: Rozanna Box, MD;  Location: [facility];  Service: Orthopedics;  Laterality:  Right;  . AMPUTATION Right 05/01/2015   Procedure: REVISION OF RIGHT TRANSTIBIAL AMPUTATION ;  Surgeon: Newt Minion, MD;  Location: [facility];  Service: Orthopedics;  Laterality: Right;  . CARDIAC CATHETERIZATION N/A 07/13/2015   Procedure: Right/Left Heart Cath and Coronary Angiography;  Surgeon: Jolaine Artist, MD;  Location: [facility];  Service: Cardiovascular;  Laterality: N/A;  . COLONOSCOPY    . I&D EXTREMITY Right 09/24/2013   Procedure: IRRIGATION AND DEBRIDEMENT RIGHT FOOT ULCER;  Surgeon: Renette Butters, MD;  Location: [facility];  Service: Orthopedics;  Laterality: Right;  . I&D EXTREMITY Right 09/26/2013   Procedure: Repeat IRRIGATION AND DEBRIDEMENT Right Foot Ulcer;  Surgeon: Rozanna Box, MD;  Location: [facility];  Service: Orthopedics;  Laterality: Right;  . MULTIPLE TOOTH EXTRACTIONS    . STUMP REVISION Right 04/20/2015   Procedure: Revision Right Below Knee Amputation, Apply Wound VAC;  Surgeon: Newt Minion, MD;  Location: [facility];  Service: Orthopedics;  Laterality: Right;  . TONSILLECTOMY     Social History   Occupational History  . Not on file.   Social History Main Topics  . Smoking status: Former Smoker    Quit date: 05/10/2000  . Smokeless tobacco: Former Systems developer    Quit date: 02/22/2000  . Alcohol use No  . Drug use: No  . Sexual activity: Not on file

## 2016-01-23 ENCOUNTER — Telehealth (INDEPENDENT_AMBULATORY_CARE_PROVIDER_SITE_OTHER): Payer: Self-pay | Admitting: Radiology

## 2016-01-23 NOTE — Telephone Encounter (Signed)
[name] with [facility] states that she was just with the patient and his blood pressure in left arm was 164/110 and 164/112 in right.  She states patient has no visual changes, headache, etc. He is not taking his blood pressure medication because he does not like it. She was just calling to inform you of pressure.

## 2016-01-23 NOTE — Telephone Encounter (Signed)
You call informed patient to critical nature of taking his blood pressure medication. We can help set him up with his primary care physician for an appointment.

## 2016-01-24 ENCOUNTER — Ambulatory Visit (HOSPITAL_COMMUNITY)
Admission: RE | Admit: 2016-01-24 | Discharge: 2016-01-24 | Disposition: A | Discharge status: Home / Self Care | Payer: Medicaid Other | Source: Ambulatory Visit | Attending: Cardiology | Admitting: Cardiology

## 2016-01-24 ENCOUNTER — Telehealth: Payer: Self-pay | Admitting: Family Medicine

## 2016-01-24 VITALS — BP 156/92 | HR 71 | Wt 193.0 lb

## 2016-01-24 DIAGNOSIS — E46 Unspecified protein-calorie malnutrition: Secondary | ICD-10-CM | POA: Insufficient documentation

## 2016-01-24 DIAGNOSIS — E1140 Type 2 diabetes mellitus with diabetic neuropathy, unspecified: Secondary | ICD-10-CM | POA: Insufficient documentation

## 2016-01-24 DIAGNOSIS — N183 Chronic kidney disease, stage 3 unspecified: Secondary | ICD-10-CM

## 2016-01-24 DIAGNOSIS — I739 Peripheral vascular disease, unspecified: Secondary | ICD-10-CM | POA: Diagnosis not present

## 2016-01-24 DIAGNOSIS — I5022 Chronic systolic (congestive) heart failure: Secondary | ICD-10-CM | POA: Insufficient documentation

## 2016-01-24 DIAGNOSIS — E1122 Type 2 diabetes mellitus with diabetic chronic kidney disease: Secondary | ICD-10-CM | POA: Diagnosis not present

## 2016-01-24 DIAGNOSIS — E1165 Type 2 diabetes mellitus with hyperglycemia: Secondary | ICD-10-CM | POA: Insufficient documentation

## 2016-01-24 DIAGNOSIS — I130 Hypertensive heart and chronic kidney disease with heart failure and stage 1 through stage 4 chronic kidney disease, or unspecified chronic kidney disease: Secondary | ICD-10-CM | POA: Diagnosis present

## 2016-01-24 DIAGNOSIS — Z87891 Personal history of nicotine dependence: Secondary | ICD-10-CM | POA: Insufficient documentation

## 2016-01-24 DIAGNOSIS — Z9114 Patient's other noncompliance with medication regimen: Secondary | ICD-10-CM | POA: Diagnosis not present

## 2016-01-24 DIAGNOSIS — H5462 Unqualified visual loss, left eye, normal vision right eye: Secondary | ICD-10-CM | POA: Diagnosis not present

## 2016-01-24 DIAGNOSIS — R0602 Shortness of breath: Secondary | ICD-10-CM | POA: Insufficient documentation

## 2016-01-24 DIAGNOSIS — I10 Essential (primary) hypertension: Secondary | ICD-10-CM | POA: Diagnosis not present

## 2016-01-24 DIAGNOSIS — N179 Acute kidney failure, unspecified: Secondary | ICD-10-CM | POA: Insufficient documentation

## 2016-01-24 DIAGNOSIS — I5042 Chronic combined systolic (congestive) and diastolic (congestive) heart failure: Secondary | ICD-10-CM | POA: Diagnosis not present

## 2016-01-24 DIAGNOSIS — T879 Unspecified complications of amputation stump: Secondary | ICD-10-CM

## 2016-01-24 DIAGNOSIS — I420 Dilated cardiomyopathy: Secondary | ICD-10-CM | POA: Diagnosis not present

## 2016-01-24 DIAGNOSIS — H547 Unspecified visual loss: Secondary | ICD-10-CM | POA: Diagnosis not present

## 2016-01-24 DIAGNOSIS — Z89511 Acquired absence of right leg below knee: Secondary | ICD-10-CM | POA: Diagnosis not present

## 2016-01-24 DIAGNOSIS — Z794 Long term (current) use of insulin: Secondary | ICD-10-CM | POA: Diagnosis not present

## 2016-01-24 MED ORDER — POTASSIUM CHLORIDE CRYS ER 20 MEQ PO TBCR: 40.0000 meq | EXTENDED_RELEASE_TABLET | Freq: Every day | ORAL | 6 refills | Status: DC

## 2016-01-24 MED ORDER — CARVEDILOL 12.5 MG PO TABS: 18.7500 mg | ORAL_TABLET | Freq: Two times a day (BID) | ORAL | 6 refills | Status: DC

## 2016-01-24 MED ORDER — ATORVASTATIN CALCIUM 40 MG PO TABS: 40.0000 mg | ORAL_TABLET | Freq: Every day | ORAL | 1 refills | Status: DC

## 2016-01-24 NOTE — Telephone Encounter (Signed)
Pt called and needs a prescription for his pen needles sent in jw

## 2016-01-24 NOTE — Progress Notes (Signed)
Patient ID: [name], male   DOB: [birth year], 60 y.o.   MRN: [id number]   Advanced Heart Failure Clinic Note   PCP:  Jake Chick, DO Vascular: Dr. Sharol Tucker HF: Dr. Haroldine Tucker   HPI: [name] is a 60 year old male with a past medical history of HTN, DM, :PAD S/p right BKA with non-haling wound, admitted for acute HF in 5/17// Echo on 07/10/15 showed EF of 15%, diffuse hypokinesis, PA pressure elevated at 81mmHg.   Was hospitalized 5/5 - 07/20/15  Here for post hospital f/u. Diuresed 42 pounds 244->202  Admitted 8/3 through 10/10/2015 with marked volume overload in the setting of medication noncompliance. Medication regimen was simplified. Diuresed with IV lasix and transitioned to 80 mg lasix daily. Discharge weight 200 pounds.   He returns for HF follow up. He continues to miss medications 3-4 doses a week.  Overall feeling ok. Denies SOB/PND/Orthopnea. Receives food from [employer].  Paramedicine following. Weight at home 190-195 pounds. Lives alone.  Has trouble getting food towards the end of the month. [name] drives him to appointments.  His [name] pays his bills.   Cath 07/13/15  Cardiac Cath Findings: Ao = 135/87 (107) LV = 141/20/30 RA = 17 RV = 67/11/20 PA = 62/29 (41) PCW = 35 (v wave 45-50) Fick cardiac output/index = 6.0/2.7 PVR = 1.0 WU SVR = 1198  FA sat = 88% PA sat = 58%, 57%  Labs (5/17): K 4.9 Cr 1.44 Labs (09/19/2015) : K 3.9 Creatinine 1.06 Labs 10/2015: Hgb A1C 13 Labs 12/16/2015: K 4.3 Creatinine 1.27    Past Medical History:  Diagnosis Date  . Acute on chronic systolic heart failure, NYHA class 3 ([place])   . AKI (acute kidney injury) ([place])   . Anemia   . BKA stump complication ([place])   . Blind left eye   . Cardiomyopathy, dilated ([place])   . Combined systolic and diastolic congestive heart failure ([place])   . Congestive dilated cardiomyopathy ([place]) 07/11/2015  . Diabetes mellitus   . Diabetic neuropathy associated with type 2 diabetes mellitus ([place])   .  Hypertension   . Noncompliance with medications   . Open knee wound 10/2015   on rt bka  . PAD (peripheral artery disease) ([place])   . Protein calorie malnutrition ([place])   . Shortness of breath dyspnea     Current Outpatient Prescriptions  Medication Sig Dispense Refill  . ACCU-CHEK AVIVA PLUS test strip CHECK SUGAR 3 TIMES DAILY 100 each 3  . atorvastatin (LIPITOR) 40 MG tablet Take 1 tablet (40 mg total) by mouth daily. 90 tablet 1  . brimonidine (ALPHAGAN) 0.2 % ophthalmic solution PLACE 1 DROP INTO THE LEFT EYE 2 (TWO) TIMES DAILY. 5 mL 0  . carvedilol (COREG) 12.5 MG tablet Take 1 tablet (12.5 mg total) by mouth 2 (two) times daily with a meal. 60 tablet 6  . dorzolamide-timolol (COSOPT) 22.3-6.8 MG/ML ophthalmic solution Place 1 drop into the left eye 2 (two) times daily. 10 mL 12  . furosemide (LASIX) 40 MG tablet Take 2 tablets (80 mg total) by mouth daily. 60 tablet 6  . Insulin Glargine (LANTUS) 100 UNIT/ML Solostar Pen Inject 10 Units into the skin daily at 10 pm. 15 mL 11  . metFORMIN (GLUCOPHAGE) 500 MG tablet Take 1 tablet (500 mg total) by mouth 2 (two) times daily with a meal. 60 tablet 6  . Multiple Vitamins-Minerals (MULTIVITAMIN WITH MINERALS) tablet Take 1 tablet by mouth daily.    Marland Kitchen  potassium chloride SA (K-DUR,KLOR-CON) 20 MEQ tablet Take 2 tablets (40 mEq total) by mouth daily. 60 tablet 6  . sacubitril-valsartan (ENTRESTO) 97-103 MG Take 1 tablet by mouth 2 (two) times daily. 60 tablet 6  . spironolactone (ALDACTONE) 25 MG tablet Take 1 tablet (25 mg total) by mouth daily. 30 tablet 3  . glucose blood (ACCU-CHEK ACTIVE STRIPS) test strip Use as instructed 100 each 12   No current facility-administered medications for this encounter.     No Known Allergies    Social History   Social History  . Marital status: Married    Spouse name: N/A  . Number of children: N/A  . Years of education: N/A   Occupational History  . Not on file.   Social History Main  Topics  . Smoking status: Former Smoker    Quit date: 05/10/2000  . Smokeless tobacco: Former Systems developer    Quit date: 02/22/2000  . Alcohol use No  . Drug use: No  . Sexual activity: Not on file   Other Topics Concern  . Not on file   Social History Narrative   ** Merged History Encounter **       Lives alone.       Family History  Problem Relation Age of Onset  . Cancer Mother   . Peripheral vascular disease Father     Vitals:   01/24/16 1441  BP: (!) 156/92  Pulse: 71  SpO2: 99%  Weight: 193 lb (87.5 kg)   Wt Readings from Last 3 Encounters:  01/24/16 193 lb (87.5 kg)  01/11/16 193 lb (87.5 kg)  01/10/16 193 lb 6.4 oz (87.7 kg)     PHYSICAL EXAM: General: Arrived in wheelchair. [name] present.  HEENT: normal Neck: supple. JVD flat. Carotids 2+ bilat; no bruits. No thyromegaly or nodule noted.  Cor: PMI nondisplaced. RRR. No rubs, or murmurs.+ S3 Lungs: CTAB, normal effort Abdomen: soft, NT, ND, no HSM. No bruits or masses. +BS  Extremities: no cyanosis, clubbing, rash, edema. RLE BKA.  Neuro: alert & oriented x 3, cranial nerves grossly intact. moves all 4 extremities w/o difficulty. Affect pleasant.   ASSESSMENT & PLAN: 1. Chronic systolic HF due to NICM. 07/2015 ECHo EF 15%. Plan to repeat ECHO after HF meds optimized.  NYHA II. Volume status stable. Continue 80 mg lasix daily + 40 meq potassium daily.  - Increase coreg to 18.75 mg twice a day. Continue entresto 97-103 mg BID. -Continue spiro to 25 mg daily Reinforced daily weights, low salt food choices, and medication compliance.   2. HTN - Elevated but has not been taking medications consistently.   3. DM2, poorly controlled- Hgb A1C 13. Having difficulty taking insulin due to impaired vision.  PCP following.   4. PAD s/p R BKA- Followed by Dr [name].  5. CKD stage 3 - Reviewed BMET from 12/16/2015  6. Social- Geophysicist/field seismologist.   Today Jake prepared pill box for the week. Follow up in 2 weeks.  Anticipate increasing coreg to 25 mg twice a day. He will need ECHO set up at that time.   Jake Clegg NP-C  2:52 PM

## 2016-01-24 NOTE — Telephone Encounter (Signed)
I called and lm on vm to advise of message below. Advised to call if he needs help making an appt with PCP to let us know.

## 2016-01-24 NOTE — Progress Notes (Signed)
Advanced Heart Failure Medication Review by a Pharmacist  Does the patient  feel that his/her medications are working for him/her?  yes  Has the patient been experiencing any side effects to the medications prescribed?  no  Does the patient measure his/her own blood pressure or blood glucose at home?  no   Does the patient have any problems obtaining medications due to transportation or finances?   no  Understanding of regimen: poor Understanding of indications: poor Potential of compliance: fair Patient understands to avoid NSAIDs. Patient understands to avoid decongestants.  Issues to address at subsequent visits: Compliance   Pharmacist comments:  [name] is a 60 yo M presenting with his medication bottles and [name] (paramedicine). He admits to missing multiple doses a week of his medications 2/2 "just forgetting". We discussed again the importance of compliance.   Ruta Hinds. Velva Harman, PharmD, BCPS, CPP Clinical Pharmacist Pager: [phone] Phone: [phone] 01/24/2016 2:52 PM      Time with patient: 10 minutes Preparation and documentation time: 2 minutes Total time: 12 minutes

## 2016-01-24 NOTE — Patient Instructions (Signed)
INCREASE Carvedilol (Coreg) to 18.75 mg (1.5 tabs) twice daily.  Follow up 2 weeks with Amy Clegg NP-C.  Do the following things EVERYDAY: 1) Weigh yourself in the morning before breakfast. Write it down and keep it in a log. 2) Take your medicines as prescribed 3) Eat low salt foods-Limit salt (sodium) to 2000 mg per day.  4) Stay as active as you can everyday 5) Limit all fluids for the day to less than 2 liters

## 2016-01-25 ENCOUNTER — Encounter (INDEPENDENT_AMBULATORY_CARE_PROVIDER_SITE_OTHER): Payer: Self-pay | Admitting: Orthopedic Surgery

## 2016-01-25 ENCOUNTER — Encounter (INDEPENDENT_AMBULATORY_CARE_PROVIDER_SITE_OTHER): Payer: Self-pay

## 2016-01-25 ENCOUNTER — Ambulatory Visit (INDEPENDENT_AMBULATORY_CARE_PROVIDER_SITE_OTHER): Payer: Medicaid Other | Admitting: Orthopedic Surgery

## 2016-01-25 VITALS — Ht 72.0 in | Wt 193.0 lb

## 2016-01-25 DIAGNOSIS — S81001S Unspecified open wound, right knee, sequela: Secondary | ICD-10-CM | POA: Diagnosis not present

## 2016-01-25 DIAGNOSIS — T879 Unspecified complications of amputation stump: Secondary | ICD-10-CM | POA: Diagnosis not present

## 2016-01-25 MED ORDER — INSULIN PEN NEEDLE 29G X 5MM MISC: 1.0000 | Freq: Once | 2 refills | Status: DC

## 2016-01-25 NOTE — Progress Notes (Signed)
Wound Care Note   Patient: [name]           Date of Birth: 06/01/1955           MRN: [id number]             PCP: Steve Rattler, DO Visit Date: 01/25/2016   Assessment & Plan: Visit Diagnoses:  1. Open wound of right knee, sequela   2. BKA stump complication ([place])     Plan: Continue dressing changes daily. Patient's wound has a good granulation base but he has a significant amount callus from where he has been kneeling on his leg to get around. Recommend that he go to [employer] or [employer] to get some kneeling pads to protect this area for weightbearing   Follow-Up Instructions: Return in about 3 weeks (around 02/15/2016).  Orders:  No orders of the defined types were placed in this encounter.  No orders of the defined types were placed in this encounter.     Procedures: No notes on file   Clinical Data: No additional findings.   No images are attached to the encounter.   Subjective: Chief Complaint  Patient presents with  . Right Knee - Wound Check    Right BKA revision 05/01/15 9 months post op.    Patients presents for two week follow up wound check, right anterior transtibial amputation. Right BKA revision 05/01/15 9 months post op. There is quarter size wound with granulation tissue. There is no odor, no redness, no swelling. He is wearing Vive shrinker, and doing manuka honey dressing changes three times weekly with home health agency.    Wound Check     Review of Systems  Miscellaneous:     Objective: Vital Signs: Ht 6' (1.829 m)   Wt 193 lb (87.5 kg)   BMI 26.18 kg/m   Physical Exam: On exam examination patient is alert oriented on The well-dressed normal affect or [place] he ambulates in a wheelchair. Examination he has significant medical callus around the tibial tubercle from patient getting around by crawling. The wound bed has 100% beefy granulation tissue which is 25 mm in diameter 2 mm deep. There is no saline was no drainage no  signs of infection. Patient has been crawling on his knee.  Specialty Comments: No specialty comments available.   PMFS History: Patient Active Problem List   Diagnosis Date Noted  . Chronic combined systolic and diastolic congestive heart failure ([place])   . Protein calorie malnutrition ([place]) 07/12/2015  . Congestive dilated cardiomyopathy ([place]) 07/11/2015  . Pressure ulcer 07/09/2015  . Open knee wound 07/06/2015  . Post-operative pain   . Complication of amputated stump ([place]) 04/29/2015  . History of right below knee amputation ([place]) 02/22/2015  . PAD (peripheral artery disease) ([place]) 02/22/2015  . Blindness of left eye 01/04/2015  . BKA stump complication ([place]) 0000000  . Diabetic neuropathy associated with type 2 diabetes mellitus ([place]) 12/01/2013  . Noncompliance with medications 07/13/2010  . OTHER SPEC TYPES SCHIZOPHRENIA UNSPEC CONDITION 05/24/2009  . Obesity, unspecified 06/09/2007  . DM (diabetes mellitus), type 2, uncontrolled ([place]) 05/02/2006  . Essential hypertension, benign 05/02/2006   Past Medical History:  Diagnosis Date  . Acute on chronic systolic heart failure, NYHA class 3 ([place])   . AKI (acute kidney injury) ([place])   . Anemia   . BKA stump complication ([place])   . Blind left eye   . Cardiomyopathy, dilated ([place])   . Combined systolic and diastolic  congestive heart failure ([place])   . Congestive dilated cardiomyopathy ([place]) 07/11/2015  . Diabetes mellitus   . Diabetic neuropathy associated with type 2 diabetes mellitus ([place])   . Hypertension   . Noncompliance with medications   . Open knee wound 10/2015   on rt bka  . PAD (peripheral artery disease) ([place])   . Protein calorie malnutrition ([place])   . Shortness of breath dyspnea     Family History  Problem Relation Age of Onset  . Cancer Mother   . Peripheral vascular disease Father    Past Surgical History:  Procedure Laterality Date  . AMPUTATION Right 09/26/2013   Procedure: AMPUTATION BELOW KNEE;   Surgeon: Rozanna Box, MD;  Location: [facility];  Service: Orthopedics;  Laterality: Right;  . AMPUTATION Right 05/01/2015   Procedure: REVISION OF RIGHT TRANSTIBIAL AMPUTATION ;  Surgeon: Newt Minion, MD;  Location: [facility];  Service: Orthopedics;  Laterality: Right;  . CARDIAC CATHETERIZATION N/A 07/13/2015   Procedure: Right/Left Heart Cath and Coronary Angiography;  Surgeon: Jolaine Artist, MD;  Location: [facility];  Service: Cardiovascular;  Laterality: N/A;  . COLONOSCOPY    . I&D EXTREMITY Right 09/24/2013   Procedure: IRRIGATION AND DEBRIDEMENT RIGHT FOOT ULCER;  Surgeon: Renette Butters, MD;  Location: [facility];  Service: Orthopedics;  Laterality: Right;  . I&D EXTREMITY Right 09/26/2013   Procedure: Repeat IRRIGATION AND DEBRIDEMENT Right Foot Ulcer;  Surgeon: Rozanna Box, MD;  Location: [facility];  Service: Orthopedics;  Laterality: Right;  . MULTIPLE TOOTH EXTRACTIONS    . STUMP REVISION Right 04/20/2015   Procedure: Revision Right Below Knee Amputation, Apply Wound VAC;  Surgeon: Newt Minion, MD;  Location: [facility];  Service: Orthopedics;  Laterality: Right;  . TONSILLECTOMY     Social History   Occupational History  . Not on file.   Social History Main Topics  . Smoking status: Former Smoker    Quit date: 05/10/2000  . Smokeless tobacco: Former Systems developer    Quit date: 02/22/2000  . Alcohol use No  . Drug use: No  . Sexual activity: Not on file

## 2016-02-01 ENCOUNTER — Telehealth (HOSPITAL_COMMUNITY): Payer: Self-pay | Admitting: *Deleted

## 2016-02-01 NOTE — Telephone Encounter (Signed)
[name] with paramedicine called pts bp 150/104. Patients weight is up from 193lbs it is now 197lbs.  Besides his abdomen being a "little tighter" no worsening symptoms.  Currently on Lasix 80mg  daily with 38meq of potassium.  Please advise.  Message routed to Amy

## 2016-02-01 NOTE — Telephone Encounter (Signed)
Entered in error

## 2016-02-01 NOTE — Telephone Encounter (Signed)
Spoke with [name] again patient has not been taking his medications as directed. Advised [name] to have patient take medications as prescribed and to let us know how his weight is Friday. Will call her if Amy has any changes   Routed to [facility]

## 2016-02-03 ENCOUNTER — Telehealth (HOSPITAL_COMMUNITY): Payer: Self-pay | Admitting: Adult Health

## 2016-02-03 ENCOUNTER — Telehealth (HOSPITAL_COMMUNITY): Payer: Self-pay

## 2016-02-03 NOTE — Telephone Encounter (Signed)
HH RN called to report patient continues to refuse taking "heart medications" as he does not like the way they make him feel. BP continues to stay in 160/100s RN went over education as to why medications were important, how high BP was dangerous for him, and how long side effects usually last until body adjusts to them. Patient continues to refuse medications. Will make CHF team aware.  Renee Pain, RN

## 2016-02-06 ENCOUNTER — Telehealth (HOSPITAL_COMMUNITY): Payer: Self-pay | Admitting: *Deleted

## 2016-02-06 NOTE — Telephone Encounter (Signed)
[name] called to let us know pt's BP was 158/98 today, pt still refuses to take meds.  He is sch w/f/u with [place] tomorrow and she just wanted [place] to be aware.

## 2016-02-07 ENCOUNTER — Encounter (HOSPITAL_COMMUNITY): Payer: Self-pay

## 2016-02-07 ENCOUNTER — Ambulatory Visit (HOSPITAL_COMMUNITY)
Admission: RE | Admit: 2016-02-07 | Discharge: 2016-02-07 | Disposition: A | Discharge status: Home / Self Care | Payer: Medicaid Other | Source: Ambulatory Visit | Attending: Internal Medicine | Admitting: Internal Medicine

## 2016-02-07 VITALS — BP 168/98 | HR 68 | Wt 194.6 lb

## 2016-02-07 DIAGNOSIS — I10 Essential (primary) hypertension: Secondary | ICD-10-CM | POA: Diagnosis not present

## 2016-02-07 DIAGNOSIS — I5042 Chronic combined systolic (congestive) and diastolic (congestive) heart failure: Secondary | ICD-10-CM | POA: Insufficient documentation

## 2016-02-07 DIAGNOSIS — D649 Anemia, unspecified: Secondary | ICD-10-CM | POA: Insufficient documentation

## 2016-02-07 DIAGNOSIS — I130 Hypertensive heart and chronic kidney disease with heart failure and stage 1 through stage 4 chronic kidney disease, or unspecified chronic kidney disease: Secondary | ICD-10-CM | POA: Diagnosis not present

## 2016-02-07 DIAGNOSIS — Z87891 Personal history of nicotine dependence: Secondary | ICD-10-CM | POA: Insufficient documentation

## 2016-02-07 DIAGNOSIS — E1165 Type 2 diabetes mellitus with hyperglycemia: Secondary | ICD-10-CM | POA: Insufficient documentation

## 2016-02-07 DIAGNOSIS — Z9114 Patient's other noncompliance with medication regimen: Secondary | ICD-10-CM | POA: Diagnosis not present

## 2016-02-07 DIAGNOSIS — H5462 Unqualified visual loss, left eye, normal vision right eye: Secondary | ICD-10-CM | POA: Insufficient documentation

## 2016-02-07 DIAGNOSIS — E1151 Type 2 diabetes mellitus with diabetic peripheral angiopathy without gangrene: Secondary | ICD-10-CM | POA: Insufficient documentation

## 2016-02-07 DIAGNOSIS — N183 Chronic kidney disease, stage 3 unspecified: Secondary | ICD-10-CM

## 2016-02-07 DIAGNOSIS — Z89511 Acquired absence of right leg below knee: Secondary | ICD-10-CM | POA: Diagnosis not present

## 2016-02-07 DIAGNOSIS — I420 Dilated cardiomyopathy: Secondary | ICD-10-CM | POA: Diagnosis not present

## 2016-02-07 DIAGNOSIS — E1122 Type 2 diabetes mellitus with diabetic chronic kidney disease: Secondary | ICD-10-CM | POA: Insufficient documentation

## 2016-02-07 DIAGNOSIS — E1140 Type 2 diabetes mellitus with diabetic neuropathy, unspecified: Secondary | ICD-10-CM | POA: Diagnosis not present

## 2016-02-07 DIAGNOSIS — Z794 Long term (current) use of insulin: Secondary | ICD-10-CM | POA: Insufficient documentation

## 2016-02-07 MED ORDER — FUROSEMIDE 40 MG PO TABS: 80.0000 mg | ORAL_TABLET | Freq: Every day | ORAL | 6 refills | Status: DC

## 2016-02-07 MED ORDER — FUROSEMIDE 80 MG PO TABS: 80.0000 mg | ORAL_TABLET | Freq: Every day | ORAL | 6 refills | Status: DC

## 2016-02-07 MED ORDER — CARVEDILOL 12.5 MG PO TABS: 12.5000 mg | ORAL_TABLET | Freq: Two times a day (BID) | ORAL | 6 refills | Status: DC

## 2016-02-07 NOTE — Patient Instructions (Addendum)
STOP Spironolactone.  DECREASE Carvedilol (Coreg) to 12.5 mg (1 tablet) twice daily.  Continue taking lasix 80 mg once daily. 80mg  tablet has been sent in to your pharmacy.  Will refer you to Palliative Care for additional support with comfort needs.  Follow up with Amy Clegg NP-C in 3 months.  Do the following things EVERYDAY: 1) Weigh yourself in the morning before breakfast. Write it down and keep it in a log. 2) Take your medicines as prescribed 3) Eat low salt foods-Limit salt (sodium) to 2000 mg per day.  4) Stay as active as you can everyday 5) Limit all fluids for the day to less than 2 liters

## 2016-02-07 NOTE — Progress Notes (Signed)
[facility] referral faxed with supporting/requested documentation per Amy Clegg NP-C. Copy of referral scanned into patient's electronic medical record and placed in pending referrals folder of CHF clinic.  Renee Pain, RN

## 2016-02-07 NOTE — Progress Notes (Signed)
Advanced Heart Failure Medication Review by a Pharmacist  Does the patient  feel that his/her medications are working for him/her?  yes  Has the patient been experiencing any side effects to the medications prescribed?  yes  Does the patient measure his/her own blood pressure or blood glucose at home?  no   Does the patient have any problems obtaining medications due to transportation or finances?   no  Understanding of regimen: poor Understanding of indications: poor Potential of compliance: poor Patient understands to avoid NSAIDs. Patient understands to avoid decongestants.  Issues to address at subsequent visits: Compliance   Pharmacist comments:  Mr. Rutman is a pleasant 60 yo M presenting with his medication bottles and [name] (paramedicine). He reports poor compliance for the past few weeks 2/2 having an upset stomach and headache. I have again reinforced the importance of compliance with him and offered to help simplify his regimen.   Ruta Hinds. Velva Harman, PharmD, BCPS, CPP Clinical Pharmacist Pager: [phone] Phone: [phone] 02/07/2016 2:50 PM      Time with patient: 16 minutes Preparation and documentation time: 6 minutes Total time:  22 minutes

## 2016-02-07 NOTE — Progress Notes (Signed)
Patient ID: [name], male   DOB: [birth year], 60 y.o.   MRN: [id number]   Advanced Heart Failure Clinic Note   PCP:  Jake Chick, DO Vascular: Dr. Sharol Tucker HF: Dr. Haroldine Tucker   HPI: [name] is a 60 year old male with a past medical history of HTN, DM, :PAD S/p right BKA with non-haling wound, admitted for acute HF in 5/17// Echo on 07/10/15 showed EF of 15%, diffuse hypokinesis, PA pressure elevated at 69mHg.   Was hospitalized 5/5 - 07/20/15  Here for post hospital f/u. Diuresed 42 pounds 244->202  Admitted 8/3 through 10/10/2015 with marked volume overload in the setting of medication noncompliance. Medication regimen was simplified. Diuresed with IV lasix and transitioned to 80 mg lasix daily. Discharge weight 200 pounds.   He returns for HF follow up. Over the last few weeks he has been missing several days of medications. He says he doesn't feel good taking the medications. Poor appetite. Increased fluid intake.  Overall feeling ok. Denies SOB/PND/Orthopnea. Receives food from fBuilding surveyor  Paramedicine following. Not weighing. Lives alone.  Has trouble getting food towards the end of the month. Sister drives him to appointments.  His sister pays his bills.   Cath 07/13/15  Cardiac Cath Findings: Ao = 135/87 (107) LV = 141/20/30 RA = 17 RV = 67/11/20 PA = 62/29 (41) PCW = 35 (v wave 45-50) Fick cardiac output/index = 6.0/2.7 PVR = 1.0 WU SVR = 1198  FA sat = 88% PA sat = 58%, 57%  Labs (5/17): K 4.9 Cr 1.44 Labs (09/19/2015) : K 3.9 Creatinine 1.06 Labs 10/2015: Hgb A1C 13 Labs 12/16/2015: K 4.3 Creatinine 1.27    Past Medical History:  Diagnosis Date  . Acute on chronic systolic heart failure, NYHA class 3 ([place]   . AKI (acute kidney injury) ([facility]   . Anemia   . BKA stump complication ([facility]   . Blind left eye   . Cardiomyopathy, dilated ([place]   . Combined systolic and diastolic congestive heart failure ([facility] Land[facility]   . Congestive dilated cardiomyopathy ([facility] 07/11/2015  .  Diabetes mellitus   . Diabetic neuropathy associated with type 2 diabetes mellitus ([facility]   . Hypertension   . Noncompliance with medications   . Open knee wound 10/2015   on rt bka  . PAD (peripheral artery disease) ([facility]   . Protein calorie malnutrition ([facility]   . Shortness of breath dyspnea     Current Outpatient Prescriptions  Medication Sig Dispense Refill  . ACCU-CHEK AVIVA PLUS test strip CHECK SUGAR 3 TIMES DAILY 100 each 3  . brimonidine (ALPHAGAN) 0.2 % ophthalmic solution PLACE 1 DROP INTO THE LEFT EYE 2 (TWO) TIMES DAILY. 5 mL 0  . dorzolamide-timolol (COSOPT) 22.3-6.8 MG/ML ophthalmic solution Place 1 drop into the left eye 2 (two) times daily. 10 mL 12  . glucose blood (ACCU-CHEK ACTIVE STRIPS) test strip Use as instructed 100 each 12  . atorvastatin (LIPITOR) 40 MG tablet Take 1 tablet (40 mg total) by mouth daily. (Patient not taking: Reported on 02/07/2016) 90 tablet 1  . carvedilol (COREG) 12.5 MG tablet Take 1.5 tablets (18.75 mg total) by mouth 2 (two) times daily with a meal. (Patient not taking: Reported on 02/07/2016) 90 tablet 6  . furosemide (LASIX) 40 MG tablet Take 2 tablets (80 mg total) by mouth daily. (Patient not taking: Reported on 02/07/2016) 60 tablet 6  . Insulin Glargine (LANTUS) 100 UNIT/ML Solostar Pen Inject 10 Units into the skin daily  at 10 pm. (Patient not taking: Reported on 02/07/2016) 15 mL 11  . metFORMIN (GLUCOPHAGE) 500 MG tablet Take 1 tablet (500 mg total) by mouth 2 (two) times daily with a meal. (Patient not taking: Reported on 02/07/2016) 60 tablet 6  . Multiple Vitamins-Minerals (MULTIVITAMIN WITH MINERALS) tablet Take 1 tablet by mouth daily.    . potassium chloride SA (K-DUR,KLOR-CON) 20 MEQ tablet Take 2 tablets (40 mEq total) by mouth daily. (Patient not taking: Reported on 02/07/2016) 60 tablet 6  . sacubitril-valsartan (ENTRESTO) 97-103 MG Take 1 tablet by mouth 2 (two) times daily. (Patient not taking: Reported on 02/07/2016) 60 tablet 6  .  spironolactone (ALDACTONE) 25 MG tablet Take 1 tablet (25 mg total) by mouth daily. (Patient not taking: Reported on 02/07/2016) 30 tablet 3   No current facility-administered medications for this encounter.     No Known Allergies    Social History   Social History  . Marital status: Married    Spouse name: N/A  . Number of children: N/A  . Years of education: N/A   Occupational History  . Not on file.   Social History Main Topics  . Smoking status: Former Smoker    Quit date: 05/10/2000  . Smokeless tobacco: Former Systems developer    Quit date: 02/22/2000  . Alcohol use No  . Drug use: No  . Sexual activity: Not on file   Other Topics Concern  . Not on file   Social History Narrative   ** Merged History Encounter **       Lives alone.       Family History  Problem Relation Age of Onset  . Cancer Mother   . Peripheral vascular disease Father     Vitals:   02/07/16 1425  BP: (!) 168/98  Pulse: 68  SpO2: 98%  Weight: 194 lb 9.6 oz (88.3 kg)   Wt Readings from Last 3 Encounters:  02/07/16 194 lb 9.6 oz (88.3 kg)  01/25/16 193 lb (87.5 kg)  01/24/16 193 lb (87.5 kg)     PHYSICAL EXAM: General: Arrived in wheelchair. [name] Paramedic present.  HEENT: normal Neck: supple. JVD flat. Carotids 2+ bilat; no bruits. No thyromegaly or nodule noted.  Cor: PMI nondisplaced. RRR. No rubs, or murmurs.+ S3 Lungs: CTAB, normal effort Abdomen: soft, NT, ND, no HSM. No bruits or masses. +BS  Extremities: no cyanosis, clubbing, rash, edema. RLE BKA.  Neuro: alert & oriented x 3, cranial nerves grossly intact. moves all 4 extremities w/o difficulty. Affect pleasant.   ASSESSMENT & PLAN: 1. Chronic systolic HF due to NICM. 07/2015 ECHo EF 15%. Plan to repeat ECHO after HF meds optimized.  NYHA II. Unfortunately he continues to take his medications intermittently. I am not sure what else can be done to encourage compliance. Paramedicine and [facility] following.  I am not able to up titrate  medications at all due to ongoing medication compliance. I wonder if he is content with his current condition. For these reasons I am going to refer him to [place] to discuss [place].  Volume status stable. Continue 80 mg lasix daily + 40 meq potassium daily.  -Cut back coreg to 12.5 mg twice a day.  Continue entresto 97-103 mg BID. 2. HTN - Elevated but does not take his medications.    3. DM2, poorly controlled- Hgb A1C 13. Having difficulty taking insulin due to impaired vision.  PCP following.   4. PAD s/p R BKA- Followed by Dr [name].  5. CKD stage 3 - Reviewed BMET from 12/16/2015  6. Social- Geophysicist/field seismologist.   Today [name] prepared his pill box for the week. What we are doing does not seem to be helping with HF medication optimization. He remains hypertensive and I have little to offer because he is not taking his medications. Refer to [place]. He verbalized understanding and was agreeable to referral.   HFSW and Paramedicine met with him today.   Follow up in 3 months.  Amy Clegg NP-C  2:39 PM

## 2016-02-15 ENCOUNTER — Ambulatory Visit (INDEPENDENT_AMBULATORY_CARE_PROVIDER_SITE_OTHER): Payer: Medicaid Other | Admitting: Family

## 2016-02-15 ENCOUNTER — Encounter (INDEPENDENT_AMBULATORY_CARE_PROVIDER_SITE_OTHER): Payer: Self-pay | Admitting: Orthopedic Surgery

## 2016-02-15 VITALS — Ht 72.0 in | Wt 194.0 lb

## 2016-02-15 DIAGNOSIS — S81001S Unspecified open wound, right knee, sequela: Secondary | ICD-10-CM | POA: Diagnosis not present

## 2016-02-15 DIAGNOSIS — Z89511 Acquired absence of right leg below knee: Secondary | ICD-10-CM

## 2016-02-15 NOTE — Progress Notes (Signed)
Office Visit Note   Patient: [name]           Date of Birth: 07/21/1955           MRN: [id number] Visit Date: 02/15/2016              Requested by: Steve Rattler, DO [address] [place], [place] [address] PCP: Steve Rattler, DO   Assessment & Plan: Visit Diagnoses:  1. History of right below knee amputation ([place])   2. Open wound of right knee, sequela     Plan: Continue with Manuka honey dressing changes with home health 3 times weekly. Recommended that he continue minimizing his weightbearing in his prosthetic until this is healed. However would rather that he wear the prosthetic to get around rather than crawling on his knees.  Follow-Up Instructions: Return in about 4 weeks (around 03/14/2016).   Orders:  No orders of the defined types were placed in this encounter.  No orders of the defined types were placed in this encounter.     Procedures: No procedures performed   Clinical Data: No additional findings.   Subjective: Chief Complaint  Patient presents with  . Right Leg - Follow-up     Revision right transtibial amputation 05/01/15      Patient is a 60 year old gentleman who is seen in follow up for persistent ulceration to his right tibial tubercle. Is a below the knee amputee on the right. Today is non weight bearing in a wheelchair. He is still using the manuka honey dressing but this dressing is not present today. He is wearing a shrinker directly over wound.   States does crawl around his apartment on his knees in an attempt to minimize weight bearing in prosthetic.    Review of Systems  Constitutional: Negative for chills and fever.     Objective: Vital Signs: Ht 6' (1.829 m)   Wt 194 lb (88 kg)   BMI 26.31 kg/m   Physical Exam  Constitutional: He is oriented to person, place, and time. He appears well-developed and well-nourished.  Pulmonary/Chest: Effort normal.  Musculoskeletal:  Right tibial tubercle with 2 cm in diameter  ulceration. There is no callus build up today. This is filled in with flat red tissue. No drainage. No surrounding erythema or sign of infection.   Neurological: He is alert and oriented to person, place, and time.  Psychiatric: He has a normal mood and affect.  Nursing note reviewed.   Ortho Exam  Specialty Comments:  No specialty comments available.  Imaging: No results found.   PMFS History: Patient Active Problem List   Diagnosis Date Noted  . Chronic combined systolic and diastolic congestive heart failure ([place])   . Protein calorie malnutrition ([place]) 07/12/2015  . Congestive dilated cardiomyopathy ([place]) 07/11/2015  . Pressure ulcer 07/09/2015  . Open knee wound 07/06/2015  . Post-operative pain   . Complication of amputated stump ([place]) 04/29/2015  . History of right below knee amputation ([place]) 02/22/2015  . PAD (peripheral artery disease) ([place]) 02/22/2015  . Blindness of left eye 01/04/2015  . BKA stump complication ([place]) 0000000  . Diabetic neuropathy associated with type 2 diabetes mellitus ([place]) 12/01/2013  . Noncompliance with medications 07/13/2010  . OTHER SPEC TYPES SCHIZOPHRENIA UNSPEC CONDITION 05/24/2009  . Obesity, unspecified 06/09/2007  . DM (diabetes mellitus), type 2, uncontrolled ([place]) 05/02/2006  . Essential hypertension, benign 05/02/2006   Past Medical History:  Diagnosis Date  . Acute on chronic  systolic heart failure, NYHA class 3 ([place])   . AKI (acute kidney injury) ([place])   . Anemia   . BKA stump complication ([place])   . Blind left eye   . Cardiomyopathy, dilated ([place])   . Combined systolic and diastolic congestive heart failure ([place])   . Congestive dilated cardiomyopathy ([place]) 07/11/2015  . Diabetes mellitus   . Diabetic neuropathy associated with type 2 diabetes mellitus ([place])   . Hypertension   . Noncompliance with medications   . Open knee wound 10/2015   on rt bka  . PAD (peripheral artery disease) ([place])   . Protein calorie  malnutrition ([place])   . Shortness of breath dyspnea     Family History  Problem Relation Age of Onset  . Cancer Mother   . Peripheral vascular disease Father     Past Surgical History:  Procedure Laterality Date  . AMPUTATION Right 09/26/2013   Procedure: AMPUTATION BELOW KNEE;  Surgeon: Rozanna Box, MD;  Location: [facility];  Service: Orthopedics;  Laterality: Right;  . AMPUTATION Right 05/01/2015   Procedure: REVISION OF RIGHT TRANSTIBIAL AMPUTATION ;  Surgeon: Newt Minion, MD;  Location: [facility];  Service: Orthopedics;  Laterality: Right;  . CARDIAC CATHETERIZATION N/A 07/13/2015   Procedure: Right/Left Heart Cath and Coronary Angiography;  Surgeon: Jolaine Artist, MD;  Location: [facility];  Service: Cardiovascular;  Laterality: N/A;  . COLONOSCOPY    . I&D EXTREMITY Right 09/24/2013   Procedure: IRRIGATION AND DEBRIDEMENT RIGHT FOOT ULCER;  Surgeon: Renette Butters, MD;  Location: [facility];  Service: Orthopedics;  Laterality: Right;  . I&D EXTREMITY Right 09/26/2013   Procedure: Repeat IRRIGATION AND DEBRIDEMENT Right Foot Ulcer;  Surgeon: Rozanna Box, MD;  Location: [facility];  Service: Orthopedics;  Laterality: Right;  . MULTIPLE TOOTH EXTRACTIONS    . STUMP REVISION Right 04/20/2015   Procedure: Revision Right Below Knee Amputation, Apply Wound VAC;  Surgeon: Newt Minion, MD;  Location: [facility];  Service: Orthopedics;  Laterality: Right;  . TONSILLECTOMY     Social History   Occupational History  . Not on file.   Social History Main Topics  . Smoking status: Former Smoker    Quit date: 05/10/2000  . Smokeless tobacco: Former Systems developer    Quit date: 02/22/2000  . Alcohol use No  . Drug use: No  . Sexual activity: Not on file

## 2016-02-17 ENCOUNTER — Other Ambulatory Visit: Payer: Self-pay | Admitting: Family Medicine

## 2016-02-17 MED ORDER — INSULIN GLARGINE 100 UNIT/ML SOLOSTAR PEN: 10.0000 [IU] | PEN_INJECTOR | Freq: Every day | SUBCUTANEOUS | 11 refills | Status: DC

## 2016-02-17 MED ORDER — INSULIN PEN NEEDLE 29G X 5MM MISC: 1.0000 | Freq: Once | 2 refills | Status: AC

## 2016-02-17 NOTE — Telephone Encounter (Signed)
Pt is unable to draw up insulin, pt can not see to do so. Pt is not getting any insulin, sugars are running 200-300. Maudie Mercury would like to get the pen and needles prescribed for the pt, she would be able to set them for him. [facility] uses [facility] [address].   Pt is consistently having high BP, today was 170/116. Maudie Mercury is calling pt's cardiologist to let them know as well. Please advise. Thanks! ep

## 2016-02-21 ENCOUNTER — Telehealth (HOSPITAL_COMMUNITY): Payer: Self-pay

## 2016-02-21 NOTE — Telephone Encounter (Signed)
[name] with CHF paramed called to report patient continues to be noncompliant with diet, fluid, and medications. Has not taken meds in 2 days, weight 197 lbs (2 lbs down from yesterday), BP 164/106. [name] continues to educate patient on these topics. No new orders at this time.  Renee Pain, RN

## 2016-02-24 ENCOUNTER — Telehealth (HOSPITAL_COMMUNITY): Payer: Self-pay

## 2016-02-24 ENCOUNTER — Encounter: Payer: Self-pay | Admitting: Licensed Clinical Social Worker

## 2016-02-24 NOTE — Telephone Encounter (Signed)
[place] RN reports continued high BP 168/112, missing medications, noncompliance with diet and fluids. Patient denies any s/s and reports feeling well. No new orders at this time.  Continue to monitor.  Renee Pain, RN

## 2016-02-24 NOTE — Progress Notes (Signed)
LCSW received call from Jenny Reichmann, Education officer, museum with [employer].   She would like to get MD to complete application for [place], however patient was last seen at [facility] in September.  Per Jenny Reichmann will call patient's Orthopedic doctor to see if he will complete the application.  Patient was seen in Ortho office December and has another appointment in January.  Plan:  Jenny Reichmann will call back if additional assistance is needed.    Casimer Lanius, LCSW Licensed Clinical Social Worker [place]   [phone] 9:00 AM

## 2016-02-28 ENCOUNTER — Telehealth (HOSPITAL_COMMUNITY): Payer: Self-pay | Admitting: *Deleted

## 2016-02-28 NOTE — Telephone Encounter (Signed)
[name] called to report that pt's BP today was 146/100, she states he is doing well and took all of his meds over the weekend.  She states he did eat a lot of ham and she discussed low salt with him but praised him for taking all meds.

## 2016-03-02 ENCOUNTER — Telehealth (HOSPITAL_COMMUNITY): Payer: Self-pay

## 2016-03-02 NOTE — Telephone Encounter (Signed)
[place] RN called to report continued noncompliance of medications, diet, and fluids. Patient did not take any medications today because "he doesn't like how they make him feel and makes him go to the bathroom too much." BP 160s/110s Patient states he is feeling well, no complaints. Nurse reports no change sin assessment. CHF providers made aware.  No new orders/ changes at this time.  Continue to monitor and educate on compliance.  Renee Pain, RN

## 2016-03-09 ENCOUNTER — Telehealth (INDEPENDENT_AMBULATORY_CARE_PROVIDER_SITE_OTHER): Payer: Self-pay

## 2016-03-09 NOTE — Telephone Encounter (Signed)
I called and sw nurse to advise per last dictation to continue with medi honey three times a week and minimize weight bearing with prosthetic

## 2016-03-09 NOTE — Telephone Encounter (Signed)
Domenic Schwab would like a verbal order to continue skilled nursing for wound care?

## 2016-03-12 ENCOUNTER — Telehealth (HOSPITAL_COMMUNITY): Payer: Self-pay | Admitting: *Deleted

## 2016-03-12 NOTE — Telephone Encounter (Signed)
[name] called reporting patient's BP today was 170/130. Patient continues to be noncompliant with medications and hadn't taken any of his pills in his pill box.  [name] educated patient and he agreed to take all his medications for today.  Patient was not experiencing any symptoms from elevated BP and [name] will continue to follow up with patient.

## 2016-03-14 ENCOUNTER — Ambulatory Visit (INDEPENDENT_AMBULATORY_CARE_PROVIDER_SITE_OTHER): Payer: Medicaid Other | Admitting: Orthopedic Surgery

## 2016-03-14 ENCOUNTER — Ambulatory Visit (INDEPENDENT_AMBULATORY_CARE_PROVIDER_SITE_OTHER): Payer: Medicaid Other | Admitting: Family

## 2016-03-14 ENCOUNTER — Encounter (INDEPENDENT_AMBULATORY_CARE_PROVIDER_SITE_OTHER): Payer: Self-pay | Admitting: Family

## 2016-03-14 VITALS — Ht 72.0 in | Wt 194.0 lb

## 2016-03-14 DIAGNOSIS — T879 Unspecified complications of amputation stump: Secondary | ICD-10-CM

## 2016-03-14 DIAGNOSIS — Z89511 Acquired absence of right leg below knee: Secondary | ICD-10-CM | POA: Diagnosis not present

## 2016-03-14 NOTE — Progress Notes (Signed)
Office Visit Note   Patient: [name]           Date of Birth: 11-Dec-1955           MRN: [id number] Visit Date: 03/14/2016              Requested by: Steve Rattler, DO [place], [place] [address] PCP: Steve Rattler, DO  Chief Complaint  Patient presents with  . Right Knee - Wound Check    RBKA wound    HPI: Patient is a 61 year old gentleman who presents today for follow up for a anterior right residual limb serration. Home health is assisting with manuka honey dressing three times a week.There is slow progress. This ulceration has been persistent despite trials of Minooka honey, Silvadene, Bactroban offloading pressure with non-weightbearing in prosthetic Patient continues with his shrinker. A wheelchair today for ambulation.    Assessment & Plan: Visit Diagnoses:  1. Complication of amputated stump ([place])   2. History of right below knee amputation [facility])     Plan: Home health will continue with Minooka honey dressing changes and wound cleansing. Have recommended that he return to his prosthetist for modifications to his prosthetic. Clean dressing applied today. Follow up in 4 weeks.  Follow-Up Instructions: Return in about 4 weeks (around 04/11/2016).   Ortho Exam Patient is alert and oriented. No adenopathy. Well-dressed. Normal affect. Respirations easy. Ambulatory in a wheelchair. To the right tibial tuberosity there is a half-dollar size open ulceration this has 2 mm of depth. Is filled in with granulation tissue. There is no drainage is some surrounding scar callus which was part of the 10 blade knife back to viable tissue. There is no surrounding erythema or odor or sign of infection.   Imaging: No results found.  Orders:  No orders of the defined types were placed in this encounter.  No orders of the defined types were placed in this encounter.    Procedures: No procedures performed  Clinical Data: No additional  findings.  Subjective: Review of Systems  Objective: Vital Signs: Ht 6' (1.829 m)   Wt 194 lb (88 kg)   BMI 26.31 kg/m   Specialty Comments:  No specialty comments available.  PMFS History: Patient Active Problem List   Diagnosis Date Noted  . Chronic combined systolic and diastolic congestive heart failure ([place])   . Protein calorie malnutrition ([place]) 07/12/2015  . Congestive dilated cardiomyopathy ([place]) 07/11/2015  . Pressure ulcer 07/09/2015  . Open knee wound 07/06/2015  . Post-operative pain   . Complication of amputated stump ([place]) 04/29/2015  . History of right below knee amputation ([place]) 02/22/2015  . PAD (peripheral artery disease) ([place]) 02/22/2015  . Blindness of left eye 01/04/2015  . BKA stump complication ([place]) 0000000  . Diabetic neuropathy associated with type 2 diabetes mellitus ([place]) 12/01/2013  . Noncompliance with medications 07/13/2010  . OTHER SPEC TYPES SCHIZOPHRENIA UNSPEC CONDITION 05/24/2009  . Obesity, unspecified 06/09/2007  . DM (diabetes mellitus), type 2, uncontrolled ([place]) 05/02/2006  . Essential hypertension, benign 05/02/2006   Past Medical History:  Diagnosis Date  . Acute on chronic systolic heart failure, NYHA class 3 ([place])   . AKI (acute kidney injury) ([place])   . Anemia   . BKA stump complication ([place])   . Blind left eye   . Cardiomyopathy, dilated ([place])   . Combined systolic and diastolic congestive heart failure ([place])   . Congestive dilated cardiomyopathy ([place]) 07/11/2015  . Diabetes mellitus   .  Diabetic neuropathy associated with type 2 diabetes mellitus ([place])   . Hypertension   . Noncompliance with medications   . Open knee wound 10/2015   on rt bka  . PAD (peripheral artery disease) ([place])   . Protein calorie malnutrition ([place])   . Shortness of breath dyspnea     Family History  Problem Relation Age of Onset  . Cancer Mother   . Peripheral vascular disease Father     Past Surgical History:  Procedure Laterality Date   . AMPUTATION Right 09/26/2013   Procedure: AMPUTATION BELOW KNEE;  Surgeon: Rozanna Box, MD;  Location: [place];  Service: Orthopedics;  Laterality: Right;  . AMPUTATION Right 05/01/2015   Procedure: REVISION OF RIGHT TRANSTIBIAL AMPUTATION ;  Surgeon: Newt Minion, MD;  Location: [place];  Service: Orthopedics;  Laterality: Right;  . CARDIAC CATHETERIZATION N/A 07/13/2015   Procedure: Right/Left Heart Cath and Coronary Angiography;  Surgeon: Jolaine Artist, MD;  Location: [place] [facility];  Service: Cardiovascular;  Laterality: N/A;  . COLONOSCOPY    . I&D EXTREMITY Right 09/24/2013   Procedure: IRRIGATION AND DEBRIDEMENT RIGHT FOOT ULCER;  Surgeon: Renette Butters, MD;  Location: [place];  Service: Orthopedics;  Laterality: Right;  . I&D EXTREMITY Right 09/26/2013   Procedure: Repeat IRRIGATION AND DEBRIDEMENT Right Foot Ulcer;  Surgeon: Rozanna Box, MD;  Location: [place];  Service: Orthopedics;  Laterality: Right;  . MULTIPLE TOOTH EXTRACTIONS    . STUMP REVISION Right 04/20/2015   Procedure: Revision Right Below Knee Amputation, Apply Wound VAC;  Surgeon: Newt Minion, MD;  Location: [place];  Service: Orthopedics;  Laterality: Right;  . TONSILLECTOMY     Social History   Occupational History  . Not on file.   Social History Main Topics  . Smoking status: Former Smoker    Quit date: 05/10/2000  . Smokeless tobacco: Former Systems developer    Quit date: 02/22/2000  . Alcohol use No  . Drug use: No  . Sexual activity: Not on file

## 2016-03-23 ENCOUNTER — Telehealth (HOSPITAL_COMMUNITY): Payer: Self-pay

## 2016-03-23 NOTE — Telephone Encounter (Signed)
HHRN reports BP 172/118 this am. Reports patient did not take medicatiosn this am, however, did take all scheduled am medications while RN was there after talking and education. RN does report some improvement in compliance with medications (less missed pill boxes, less reluctant to refuse medications when asked to take them). HHRN to continue to monitor, educate, and encourage compliance.  Renee Pain, RN

## 2016-03-26 ENCOUNTER — Telehealth (HOSPITAL_COMMUNITY): Payer: Self-pay

## 2016-03-26 NOTE — Telephone Encounter (Signed)
168/112 BP. Patient denies s/s, refusing to be evaluated, RN states pill box is empty so it appears patient has taken all medications for the day (usually noncompliant, unsure if maybe he threw pills away as they are usually left in the pill box). Will forward to

## 2016-04-02 ENCOUNTER — Telehealth (HOSPITAL_COMMUNITY): Payer: Self-pay | Admitting: *Deleted

## 2016-04-02 NOTE — Telephone Encounter (Signed)
[name] called to report that pt's BP today is 170/114, pt has not been taking his meds and refuses to take them while RN is there.  Darrick Grinder, NP aware.  Advised pt needs to take meds

## 2016-04-09 ENCOUNTER — Telehealth (HOSPITAL_COMMUNITY): Payer: Self-pay | Admitting: *Deleted

## 2016-04-09 NOTE — Telephone Encounter (Signed)
RN with [facility] was with patient in home and BP was 180/118 and weight gain of 4 lbs since last week.  Patient still refusing all medications. RN got him to agree to take [facility] today but that was it.  I reported to Any Tillery, PA and again advises patient to take his medications.

## 2016-04-13 ENCOUNTER — Telehealth (HOSPITAL_COMMUNITY): Payer: Self-pay

## 2016-04-13 NOTE — Telephone Encounter (Signed)
Patients weight up 7lb in past week.  Has missed 8 of his 14 doses this past week in the pill box.  Left leg swelling, no shortness of breath, lungs clear, sats 95% RA.  BP 164/100, patient has no complaints.  Continues to be noncompliant with meds, diet, and fluids.  Dr. Aundra Dubin made aware, no new orders received. Will continue to monitor.  Renee Pain, RN

## 2016-04-16 ENCOUNTER — Telehealth (HOSPITAL_COMMUNITY): Payer: Self-pay | Admitting: *Deleted

## 2016-04-16 NOTE — Telephone Encounter (Signed)
[name] called to report pt's BP was 172/118 then 168/112 this AM.  Pt stated he had taken meds over the weekend but not yet today and he refuses to take them while she is there.  Advised her to tell pt we recommend he take meds ASAP and report to ER for stroke like symptoms, she will advised pt and review symptoms with him again.

## 2016-04-20 ENCOUNTER — Other Ambulatory Visit (HOSPITAL_COMMUNITY): Payer: Self-pay

## 2016-04-20 ENCOUNTER — Telehealth (HOSPITAL_COMMUNITY): Payer: Self-pay | Admitting: *Deleted

## 2016-04-20 NOTE — Telephone Encounter (Signed)
[name] called to report pt's BP today was 162/112 and wt is up to 207 lb, it was only 203 earlier this week.  She states pt told her he has not been taking meds this week but that he did take them this AM when [facility] w/paramedicine saw him.  She again advised pt of importance of taking meds on a regular basis and what symptoms to report to ER for.  Oda Kilts, PA aware, will discuss pt's non compliance at his next OV

## 2016-04-20 NOTE — Progress Notes (Signed)
Paramedicine Encounter    Patient ID: [name], male    DOB: December 05, 1955, 61 y.o.   MRN: [id number]   Patient Care Team: Steve Rattler, DO as PCP - General  Patient Active Problem List   Diagnosis Date Noted  . Chronic combined systolic and diastolic congestive heart failure ([place])   . Protein calorie malnutrition ([place]) 07/12/2015  . Congestive dilated cardiomyopathy ([place]) 07/11/2015  . Pressure ulcer 07/09/2015  . Open knee wound 07/06/2015  . Post-operative pain   . Complication of amputated stump ([place]) 04/29/2015  . History of right below knee amputation ([place]) 02/22/2015  . PAD (peripheral artery disease) ([place]) 02/22/2015  . Blindness of left eye 01/04/2015  . BKA stump complication ([place]) 0000000  . Diabetic neuropathy associated with type 2 diabetes mellitus ([place]) 12/01/2013  . Noncompliance with medications 07/13/2010  . OTHER SPEC TYPES SCHIZOPHRENIA UNSPEC CONDITION 05/24/2009  . Obesity, unspecified 06/09/2007  . DM (diabetes mellitus), type 2, uncontrolled ([place]) 05/02/2006  . Essential hypertension, benign 05/02/2006    Current Outpatient Prescriptions:  .  ACCU-CHEK AVIVA PLUS test strip, CHECK SUGAR 3 TIMES DAILY, Disp: 100 each, Rfl: 3 .  atorvastatin (LIPITOR) 40 MG tablet, Take 1 tablet (40 mg total) by mouth daily., Disp: 90 tablet, Rfl: 1 .  brimonidine (ALPHAGAN) 0.2 % ophthalmic solution, PLACE 1 DROP INTO THE LEFT EYE 2 (TWO) TIMES DAILY., Disp: 5 mL, Rfl: 0 .  carvedilol (COREG) 12.5 MG tablet, Take 1 tablet (12.5 mg total) by mouth 2 (two) times daily with a meal., Disp: 60 tablet, Rfl: 6 .  dorzolamide-timolol (COSOPT) 22.3-6.8 MG/ML ophthalmic solution, Place 1 drop into the left eye 2 (two) times daily., Disp: 10 mL, Rfl: 12 .  furosemide (LASIX) 80 MG tablet, Take 1 tablet (80 mg total) by mouth daily., Disp: 30 tablet, Rfl: 6 .  glucose blood (ACCU-CHEK ACTIVE STRIPS) test strip, Use as instructed, Disp: 100 each, Rfl: 12 .  Insulin Glargine  (LANTUS) 100 UNIT/ML Solostar Pen, Inject 10 Units into the skin daily at 10 pm., Disp: 15 mL, Rfl: 11 .  metFORMIN (GLUCOPHAGE) 500 MG tablet, Take 1 tablet (500 mg total) by mouth 2 (two) times daily with a meal., Disp: 60 tablet, Rfl: 6 .  Multiple Vitamins-Minerals (MULTIVITAMIN WITH MINERALS) tablet, Take 1 tablet by mouth daily., Disp: , Rfl:  .  potassium chloride SA (K-DUR,KLOR-CON) 20 MEQ tablet, Take 2 tablets (40 mEq total) by mouth daily., Disp: 60 tablet, Rfl: 6 .  sacubitril-valsartan (ENTRESTO) 97-103 MG, Take 1 tablet by mouth 2 (two) times daily., Disp: 60 tablet, Rfl: 6 No Known Allergies   Social History   Social History  . Marital status: Married    Spouse name: N/A  . Number of children: N/A  . Years of education: N/A   Occupational History  . Not on file.   Social History Main Topics  . Smoking status: Former Smoker    Quit date: 05/10/2000  . Smokeless tobacco: Former Systems developer    Quit date: 02/22/2000  . Alcohol use No  . Drug use: No  . Sexual activity: Not on file   Other Topics Concern  . Not on file   Social History Narrative   ** Merged History Encounter **       Lives alone.     Physical Exam      Future Appointments Date Time Provider [place]  04/25/2016 1:00 PM Newt Minion, MD [facility] None  05/08/2016 10:00 AM [facility] PA/NP  [facility] None    ATF pt [name] x4, stating that he thinks that he has the flu.  Pt stated that he has a non-productive cough and nausea.  Pt denies fever, body aches, and diarrhea.  Pt also denies dizziness, SOB, headache, and chest pain.  Pt stated that the lady from [facility] had the flu this week, therefore she re-scheduled her appointment with him until next Monday.  Pt has not eaten today but stated that he has a good appetite.  Pt plans on eating soup (chicken noodle soup), CHP advised pt against eating the soup and explained the sodium content in th can.  Pt has nothing else to eat. Pt continues to  skip medications dosages on purpose; pt skipped 5 doses within the past week.  Pt was advised to take his rx daily.  rx bottles and pill box verified.  cbg 198 BP (!) 142/90 (BP Location: Right Arm, Patient Position: Sitting, Cuff Size: Normal)   Pulse 71   Resp 16   Wt 207 lb (93.9 kg)   SpO2 95%   BMI 28.07 kg/m   Weight yesterday-205 Last visit weight-204    ACTION: Home visit completed Next visit planned for next friday

## 2016-04-25 ENCOUNTER — Ambulatory Visit (INDEPENDENT_AMBULATORY_CARE_PROVIDER_SITE_OTHER): Payer: Medicaid Other | Admitting: Orthopedic Surgery

## 2016-04-25 ENCOUNTER — Encounter (INDEPENDENT_AMBULATORY_CARE_PROVIDER_SITE_OTHER): Payer: Self-pay | Admitting: Orthopedic Surgery

## 2016-04-25 DIAGNOSIS — T879 Unspecified complications of amputation stump: Secondary | ICD-10-CM | POA: Diagnosis not present

## 2016-04-25 NOTE — Progress Notes (Signed)
Office Visit Note   Patient: [name]           Date of Birth: [birth year]           MRN: [id number] Visit Date: 04/25/2016              Requested by: Steve Rattler, DO [place], [place] [address] PCP: Steve Rattler, DO  Chief Complaint  Patient presents with  . Right Knee - Wound Check    RBKA wound    HPI: Patient is a 61 year old gentleman who presents today for follow up for a anterior right residual limb wound. Home health is assisting with manuka honey dressing three times a week. He ambulates with cane and prosthetic today. There is fibrinous exudate. There is no odor or redness. Maxcine Ham, RT  Patient had his prosthetic socket revised 3 weeks ago 20 oh pressure from the tibial tubercle.  Assessment & Plan: Visit Diagnoses:  1. BKA stump complication ([place])     Plan: Continue the Minooka honey continue with a modified socket follow-up in 4 weeks  Follow-Up Instructions: Return in about 4 weeks (around 05/23/2016).   Ortho Exam On examination patient is alert oriented no adenopathy well-dressed normal affect normal respiratory effort he does have an antalgic gait. Patient has a large area that was cut out of his socket to relieve pressure from the tibial tubercle. Examination there is no in bearing ulcer there is no cellulitis. The ulcer over the tibial tubercle has callus around it there is good granulation tissue the ulcer is 10 mm in diameter 0.1 mm deep with healthy viable granulation tissue. Iodosorb and a Band-Aid was applied patient will continue with his Minooka honey dressing changes  Imaging: No results found.  Orders:  No orders of the defined types were placed in this encounter.  No orders of the defined types were placed in this encounter.    Procedures: No procedures performed  Clinical Data: No additional findings.  Subjective: Review of Systems  Objective: Vital Signs: There were no vitals taken for this  visit.  Specialty Comments:  No specialty comments available.  PMFS History: Patient Active Problem List   Diagnosis Date Noted  . Chronic combined systolic and diastolic congestive heart failure ([place])   . Protein calorie malnutrition ([place]) 07/12/2015  . Congestive dilated cardiomyopathy ([place]) 07/11/2015  . Pressure ulcer 07/09/2015  . Open knee wound 07/06/2015  . Post-operative pain   . Complication of amputated stump ([place]) 04/29/2015  . History of right below knee amputation ([place]) 02/22/2015  . PAD (peripheral artery disease) ([place]) 02/22/2015  . Blindness of left eye 01/04/2015  . BKA stump complication ([place]) 0000000  . Diabetic neuropathy associated with type 2 diabetes mellitus ([place]) 12/01/2013  . Noncompliance with medications 07/13/2010  . OTHER SPEC TYPES SCHIZOPHRENIA UNSPEC CONDITION 05/24/2009  . Obesity, unspecified 06/09/2007  . DM (diabetes mellitus), type 2, uncontrolled ([place]) 05/02/2006  . Essential hypertension, benign 05/02/2006   Past Medical History:  Diagnosis Date  . Acute on chronic systolic heart failure, NYHA class 3 ([place])   . AKI (acute kidney injury) ([place])   . Anemia   . BKA stump complication ([place])   . Blind left eye   . Cardiomyopathy, dilated ([place])   . Combined systolic and diastolic congestive heart failure ([place])   . Congestive dilated cardiomyopathy ([place]) 07/11/2015  . Diabetes mellitus   . Diabetic neuropathy associated with type 2 diabetes mellitus ([place])   .  Hypertension   . Noncompliance with medications   . Open knee wound 10/2015   on rt bka  . PAD (peripheral artery disease) ([place])   . Protein calorie malnutrition ([place])   . Shortness of breath dyspnea     Family History  Problem Relation Age of Onset  . Cancer Mother   . Peripheral vascular disease Father     Past Surgical History:  Procedure Laterality Date  . AMPUTATION Right 09/26/2013   Procedure: AMPUTATION BELOW KNEE;  Surgeon: Rozanna Box, MD;  Location: [place];   Service: Orthopedics;  Laterality: Right;  . AMPUTATION Right 05/01/2015   Procedure: REVISION OF RIGHT TRANSTIBIAL AMPUTATION ;  Surgeon: Newt Minion, MD;  Location: [place];  Service: Orthopedics;  Laterality: Right;  . CARDIAC CATHETERIZATION N/A 07/13/2015   Procedure: Right/Left Heart Cath and Coronary Angiography;  Surgeon: Jolaine Artist, MD;  Location: [place] [facility];  Service: Cardiovascular;  Laterality: N/A;  . COLONOSCOPY    . I&D EXTREMITY Right 09/24/2013   Procedure: IRRIGATION AND DEBRIDEMENT RIGHT FOOT ULCER;  Surgeon: Renette Butters, MD;  Location: [place];  Service: Orthopedics;  Laterality: Right;  . I&D EXTREMITY Right 09/26/2013   Procedure: Repeat IRRIGATION AND DEBRIDEMENT Right Foot Ulcer;  Surgeon: Rozanna Box, MD;  Location: [facility];  Service: Orthopedics;  Laterality: Right;  . MULTIPLE TOOTH EXTRACTIONS    . STUMP REVISION Right 04/20/2015   Procedure: Revision Right Below Knee Amputation, Apply Wound VAC;  Surgeon: Newt Minion, MD;  Location: [place];  Service: Orthopedics;  Laterality: Right;  . TONSILLECTOMY     Social History   Occupational History  . Not on file.   Social History Main Topics  . Smoking status: Former Smoker    Quit date: 05/10/2000  . Smokeless tobacco: Former Systems developer    Quit date: 02/22/2000  . Alcohol use No  . Drug use: No  . Sexual activity: Not on file

## 2016-04-27 ENCOUNTER — Other Ambulatory Visit (HOSPITAL_COMMUNITY): Payer: Self-pay

## 2016-04-27 NOTE — Progress Notes (Signed)
Paramedicine Encounter    Patient ID: [name], male    DOB: 06-Dec-1955, 61 y.o.   MRN: [id number]   Patient Care Team: Steve Rattler, DO as PCP - General  Patient Active Problem List   Diagnosis Date Noted  . Chronic combined systolic and diastolic congestive heart failure ([place])   . Protein calorie malnutrition ([place]) 07/12/2015  . Congestive dilated cardiomyopathy ([place]) 07/11/2015  . Pressure ulcer 07/09/2015  . Open knee wound 07/06/2015  . Post-operative pain   . Complication of amputated stump ([place]) 04/29/2015  . History of right below knee amputation ([place]) 02/22/2015  . PAD (peripheral artery disease) ([place]) 02/22/2015  . Blindness of left eye 01/04/2015  . BKA stump complication ([place]) 0000000  . Diabetic neuropathy associated with type 2 diabetes mellitus ([place]) 12/01/2013  . Noncompliance with medications 07/13/2010  . OTHER SPEC TYPES SCHIZOPHRENIA UNSPEC CONDITION 05/24/2009  . Obesity, unspecified 06/09/2007  . DM (diabetes mellitus), type 2, uncontrolled ([place]) 05/02/2006  . Essential hypertension, benign 05/02/2006    Current Outpatient Prescriptions:  .  ACCU-CHEK AVIVA PLUS test strip, CHECK SUGAR 3 TIMES DAILY, Disp: 100 each, Rfl: 3 .  atorvastatin (LIPITOR) 40 MG tablet, Take 1 tablet (40 mg total) by mouth daily., Disp: 90 tablet, Rfl: 1 .  brimonidine (ALPHAGAN) 0.2 % ophthalmic solution, PLACE 1 DROP INTO THE LEFT EYE 2 (TWO) TIMES DAILY., Disp: 5 mL, Rfl: 0 .  carvedilol (COREG) 12.5 MG tablet, Take 1 tablet (12.5 mg total) by mouth 2 (two) times daily with a meal., Disp: 60 tablet, Rfl: 6 .  dorzolamide-timolol (COSOPT) 22.3-6.8 MG/ML ophthalmic solution, Place 1 drop into the left eye 2 (two) times daily., Disp: 10 mL, Rfl: 12 .  furosemide (LASIX) 80 MG tablet, Take 1 tablet (80 mg total) by mouth daily., Disp: 30 tablet, Rfl: 6 .  glucose blood (ACCU-CHEK ACTIVE STRIPS) test strip, Use as instructed, Disp: 100 each, Rfl: 12 .  Insulin Glargine  (LANTUS) 100 UNIT/ML Solostar Pen, Inject 10 Units into the skin daily at 10 pm., Disp: 15 mL, Rfl: 11 .  metFORMIN (GLUCOPHAGE) 500 MG tablet, Take 1 tablet (500 mg total) by mouth 2 (two) times daily with a meal., Disp: 60 tablet, Rfl: 6 .  Multiple Vitamins-Minerals (MULTIVITAMIN WITH MINERALS) tablet, Take 1 tablet by mouth daily., Disp: , Rfl:  .  potassium chloride SA (K-DUR,KLOR-CON) 20 MEQ tablet, Take 2 tablets (40 mEq total) by mouth daily., Disp: 60 tablet, Rfl: 6 .  sacubitril-valsartan (ENTRESTO) 97-103 MG, Take 1 tablet by mouth 2 (two) times daily., Disp: 60 tablet, Rfl: 6 No Known Allergies   Social History   Social History  . Marital status: Married    Spouse name: N/A  . Number of children: N/A  . Years of education: N/A   Occupational History  . Not on file.   Social History Main Topics  . Smoking status: Former Smoker    Quit date: 05/10/2000  . Smokeless tobacco: Former Systems developer    Quit date: 02/22/2000  . Alcohol use No  . Drug use: No  . Sexual activity: Not on file   Other Topics Concern  . Not on file   Social History Narrative   ** Merged History Encounter **       Lives alone.     Physical Exam      Future Appointments Date Time Provider [place]  05/08/2016 10:00 AM [facility] PA/NP [facility] None  05/23/2016 1:00 PM Newt Minion, MD  [facility] None   ATF pt [name] x4 with no complaints. Pt denies sob, headaches, dizziness, and chest pain. Pt stated that he has an appointment with jackie b.this coming Wednesday @ 12. Pt only took two doses of his medications since last Friday.  Pt reports that he didn't take his medications because he hasn't been feeling well.  Pt was advised to take his medications and advised of the consequences of not doing the same.  Pt stated that he has been waking up in the middle of the night and having issues with sleeping.  rx bottles and pill box verified.  Heart clinic called and advised of pt's weight increase, him not  taking his rx, and his vitals.    BP (!) 148/100 (BP Location: Right Arm, Patient Position: Sitting, Cuff Size: Normal)   Pulse 70   Wt 207 lb (93.9 kg)   SpO2 96%   BMI 28.07 kg/m   Weight yesterday-204 Last visit weight-207    ACTION: Home visit completed Next visit planned for next wed in clinic

## 2016-04-30 ENCOUNTER — Telehealth (HOSPITAL_COMMUNITY): Payer: Self-pay | Admitting: *Deleted

## 2016-04-30 NOTE — Telephone Encounter (Signed)
[name] called to report pt's today was 158/114, pt has not taken medications and refuses to take them while she is there.  She states pt is asymptomatic, she has advised pt to report to ER for symptoms.

## 2016-05-04 ENCOUNTER — Telehealth (HOSPITAL_COMMUNITY): Payer: Self-pay | Admitting: *Deleted

## 2016-05-04 ENCOUNTER — Other Ambulatory Visit (HOSPITAL_COMMUNITY): Payer: Self-pay

## 2016-05-04 NOTE — Progress Notes (Signed)
Paramedicine Encounter    Patient ID: [name], male    DOB: [birth year], 61 y.o.   MRN: [id number]   Patient Care Team: Steve Rattler, DO as PCP - General  Patient Active Problem List   Diagnosis Date Noted  . Chronic combined systolic and diastolic congestive heart failure ([place])   . Protein calorie malnutrition ([place]) 07/12/2015  . Congestive dilated cardiomyopathy ([place]) 07/11/2015  . Pressure ulcer 07/09/2015  . Open knee wound 07/06/2015  . Post-operative pain   . Complication of amputated stump ([place]) 04/29/2015  . History of right below knee amputation ([place]) 02/22/2015  . PAD (peripheral artery disease) ([place]) 02/22/2015  . Blindness of left eye 01/04/2015  . BKA stump complication ([place]) 0000000  . Diabetic neuropathy associated with type 2 diabetes mellitus ([place]) 12/01/2013  . Noncompliance with medications 07/13/2010  . OTHER SPEC TYPES SCHIZOPHRENIA UNSPEC CONDITION 05/24/2009  . Obesity, unspecified 06/09/2007  . DM (diabetes mellitus), type 2, uncontrolled ([place]) 05/02/2006  . Essential hypertension, benign 05/02/2006    Current Outpatient Prescriptions:  .  ACCU-CHEK AVIVA PLUS test strip, CHECK SUGAR 3 TIMES DAILY, Disp: 100 each, Rfl: 3 .  atorvastatin (LIPITOR) 40 MG tablet, Take 1 tablet (40 mg total) by mouth daily., Disp: 90 tablet, Rfl: 1 .  brimonidine (ALPHAGAN) 0.2 % ophthalmic solution, PLACE 1 DROP INTO THE LEFT EYE 2 (TWO) TIMES DAILY., Disp: 5 mL, Rfl: 0 .  carvedilol (COREG) 12.5 MG tablet, Take 1 tablet (12.5 mg total) by mouth 2 (two) times daily with a meal., Disp: 60 tablet, Rfl: 6 .  dorzolamide-timolol (COSOPT) 22.3-6.8 MG/ML ophthalmic solution, Place 1 drop into the left eye 2 (two) times daily., Disp: 10 mL, Rfl: 12 .  furosemide (LASIX) 80 MG tablet, Take 1 tablet (80 mg total) by mouth daily., Disp: 30 tablet, Rfl: 6 .  glucose blood (ACCU-CHEK ACTIVE STRIPS) test strip, Use as instructed, Disp: 100 each, Rfl: 12 .  Insulin Glargine  (LANTUS) 100 UNIT/ML Solostar Pen, Inject 10 Units into the skin daily at 10 pm., Disp: 15 mL, Rfl: 11 .  metFORMIN (GLUCOPHAGE) 500 MG tablet, Take 1 tablet (500 mg total) by mouth 2 (two) times daily with a meal., Disp: 60 tablet, Rfl: 6 .  Multiple Vitamins-Minerals (MULTIVITAMIN WITH MINERALS) tablet, Take 1 tablet by mouth daily., Disp: , Rfl:  .  potassium chloride SA (K-DUR,KLOR-CON) 20 MEQ tablet, Take 2 tablets (40 mEq total) by mouth daily., Disp: 60 tablet, Rfl: 6 .  sacubitril-valsartan (ENTRESTO) 97-103 MG, Take 1 tablet by mouth 2 (two) times daily., Disp: 60 tablet, Rfl: 6 No Known Allergies   Social History   Social History  . Marital status: Married    Spouse name: N/A  . Number of children: N/A  . Years of education: N/A   Occupational History  . Not on file.   Social History Main Topics  . Smoking status: Former Smoker    Quit date: 05/10/2000  . Smokeless tobacco: Former Systems developer    Quit date: 02/22/2000  . Alcohol use No  . Drug use: No  . Sexual activity: Not on file   Other Topics Concern  . Not on file   Social History Narrative   ** Merged History Encounter **       Lives alone.     Physical Exam  Eyes: Pupils are equal, round, and reactive to light.  Pulmonary/Chest: No respiratory distress. He has no wheezes. He has no rales.  Abdominal: He exhibits no  distension. There is no tenderness. There is no rebound and no guarding.  Skin: Skin is warm and dry. He is not diaphoretic.        Future Appointments Date Time Provider [place]  05/08/2016 10:00 AM [facility] PA/NP [facility] None  05/23/2016 1:00 PM Newt Minion, MD [facility] None   ATF pt [name] x4 complaining of headache this am.  Pt stated that his bp is elevated, pt's home health nurse took it@ 170/120.  Pt also having sob, his lung sounds are clear and sp02 noted @ 95%. Pt has no dizziness and no chest pain. Pt has only taken his medications for this am and 2 other times. Pt was advise that  his headache could be due to his bp being elevated.  Pt stated that " he just hasn't taken his medications".     BP (!) 180/118 (BP Location: Right Arm, Patient Position: Sitting, Cuff Size: Normal)   Pulse 75   Resp 14   Wt 205 lb (93 kg)   SpO2 95%   BMI 27.80 kg/m    Cbg: 200  Weight yesterday-204 Last visit weight-207    ACTION: Home visit completed Next visit planned for next week

## 2016-05-04 NOTE — Telephone Encounter (Signed)
Dee called to report patient BP of 178/118. [name] with home health also called earlier today regarding the same issue.  Dee said patient's weight is the same and lung sounds are normal.  Patient did feel that he was retaining some fluid but not having any shortness of breath.  Patient continues to be non-compliant with his medications but [name] was able to get him to take them while she was there.  She also re-educated him on his medications.  [name] will continue to follow and report any updates.

## 2016-05-08 ENCOUNTER — Ambulatory Visit (HOSPITAL_COMMUNITY)
Admission: RE | Admit: 2016-05-08 | Discharge: 2016-05-08 | Disposition: A | Discharge status: Home / Self Care | Payer: Medicaid Other | Source: Ambulatory Visit | Attending: Cardiology | Admitting: Cardiology

## 2016-05-08 ENCOUNTER — Other Ambulatory Visit (HOSPITAL_COMMUNITY): Payer: Self-pay

## 2016-05-08 ENCOUNTER — Encounter (HOSPITAL_COMMUNITY): Payer: Self-pay

## 2016-05-08 VITALS — BP 176/106 | HR 72 | Wt 209.4 lb

## 2016-05-08 DIAGNOSIS — I420 Dilated cardiomyopathy: Secondary | ICD-10-CM | POA: Insufficient documentation

## 2016-05-08 DIAGNOSIS — I10 Essential (primary) hypertension: Secondary | ICD-10-CM | POA: Diagnosis not present

## 2016-05-08 DIAGNOSIS — N183 Chronic kidney disease, stage 3 (moderate): Secondary | ICD-10-CM | POA: Insufficient documentation

## 2016-05-08 DIAGNOSIS — E1165 Type 2 diabetes mellitus with hyperglycemia: Secondary | ICD-10-CM | POA: Diagnosis not present

## 2016-05-08 DIAGNOSIS — I5042 Chronic combined systolic (congestive) and diastolic (congestive) heart failure: Secondary | ICD-10-CM | POA: Insufficient documentation

## 2016-05-08 DIAGNOSIS — Z89511 Acquired absence of right leg below knee: Secondary | ICD-10-CM | POA: Insufficient documentation

## 2016-05-08 DIAGNOSIS — I130 Hypertensive heart and chronic kidney disease with heart failure and stage 1 through stage 4 chronic kidney disease, or unspecified chronic kidney disease: Secondary | ICD-10-CM | POA: Insufficient documentation

## 2016-05-08 DIAGNOSIS — H5462 Unqualified visual loss, left eye, normal vision right eye: Secondary | ICD-10-CM | POA: Insufficient documentation

## 2016-05-08 DIAGNOSIS — Z9114 Patient's other noncompliance with medication regimen: Secondary | ICD-10-CM

## 2016-05-08 DIAGNOSIS — E1151 Type 2 diabetes mellitus with diabetic peripheral angiopathy without gangrene: Secondary | ICD-10-CM | POA: Insufficient documentation

## 2016-05-08 DIAGNOSIS — Z794 Long term (current) use of insulin: Secondary | ICD-10-CM | POA: Insufficient documentation

## 2016-05-08 DIAGNOSIS — Z9119 Patient's noncompliance with other medical treatment and regimen: Secondary | ICD-10-CM | POA: Diagnosis not present

## 2016-05-08 DIAGNOSIS — E46 Unspecified protein-calorie malnutrition: Secondary | ICD-10-CM | POA: Diagnosis not present

## 2016-05-08 DIAGNOSIS — E1122 Type 2 diabetes mellitus with diabetic chronic kidney disease: Secondary | ICD-10-CM | POA: Insufficient documentation

## 2016-05-08 DIAGNOSIS — E1142 Type 2 diabetes mellitus with diabetic polyneuropathy: Secondary | ICD-10-CM | POA: Diagnosis not present

## 2016-05-08 DIAGNOSIS — Z87891 Personal history of nicotine dependence: Secondary | ICD-10-CM | POA: Insufficient documentation

## 2016-05-08 DIAGNOSIS — I739 Peripheral vascular disease, unspecified: Secondary | ICD-10-CM

## 2016-05-08 DIAGNOSIS — IMO0002 Reserved for concepts with insufficient information to code with codable children: Secondary | ICD-10-CM

## 2016-05-08 LAB — BASIC METABOLIC PANEL
Anion gap: 7 (ref 5–15)
BUN: 29 mg/dL — ABNORMAL HIGH (ref 6–20)
CO2: 25 mmol/L (ref 22–32)
Calcium: 9.1 mg/dL (ref 8.9–10.3)
Chloride: 107 mmol/L (ref 101–111)
Creatinine, Ser: 1.43 mg/dL — ABNORMAL HIGH (ref 0.61–1.24)
GFR calc Af Amer: >60 mL/min (ref >60)
GFR calc non Af Amer: 52 mL/min — ABNORMAL LOW (ref >60)
Glucose, Bld: 126 mg/dL — ABNORMAL HIGH (ref 65–99)
Potassium: 4.2 mmol/L (ref 3.5–5.1)
Sodium: 139 mmol/L (ref 135–145)

## 2016-05-08 LAB — BRAIN NATRIURETIC PEPTIDE: B Natriuretic Peptide: 1643 pg/mL — ABNORMAL HIGH (ref 0.0–100.0)

## 2016-05-08 MED ORDER — SACUBITRIL-VALSARTAN 24-26 MG PO TABS: 1.0000 | ORAL_TABLET | Freq: Two times a day (BID) | ORAL | 3 refills | Status: DC

## 2016-05-08 NOTE — Patient Instructions (Signed)
Decrease Entresto to 24/26 mg Twice daily   Labs today  Your physician recommends that you schedule a follow-up appointment in: 2 weeks

## 2016-05-08 NOTE — Progress Notes (Signed)
Patient ID: [name], male   DOB: [birth year], 61 y.o.   MRN: [id number]   Advanced Heart Failure Clinic Note   PCP:  Jake Chick, DO Vascular: [name] HF: [name]   HPI: [name] is a 61 year old male with a past medical history of HTN, DM, :PAD S/p right BKA with non-haling wound, admitted for acute HF in 5/17// Echo on 07/10/15 showed EF of 15%, diffuse hypokinesis, PA pressure elevated at 63mmHg.   Was hospitalized 5/5 - 07/20/15  Here for post hospital f/u. Diuresed 42 pounds 244->202  Admitted 8/3 through 10/10/2015 with marked volume overload in the setting of medication noncompliance. Medication regimen was simplified. Diuresed with IV lasix and transitioned to 80 mg lasix daily. Discharge weight 200 pounds.   Pt returns today for regular follow up. Pt weight up 10 lbs since last visit. BP elevated. Took medications yesterday, prior to that hadn't taken any medications since Friday. States he is afraid his medications will hurt him.  Notices his vision is gradually getting worse over time.  Has occasional orthopnea. Is usually OK with ADLs when it comes to breathing.  Gets food from [place].  Paramedicine following. Pt lives alone. Weight at home has been around ~200-205. Sisters drives him to appointments and helps with bills.   Cath 07/13/15  Cardiac Cath Findings: Ao = 135/87 (107) LV = 141/20/30 RA = 17 RV = 67/11/20 PA = 62/29 (41) PCW = 35 (v wave 45-50) Fick cardiac output/index = 6.0/2.7 PVR = 1.0 WU SVR = 1198  FA sat = 88% PA sat = 58%, 57%  Labs (5/17): K 4.9 Cr 1.44 Labs (09/19/2015) : K 3.9 Creatinine 1.06 Labs 10/2015: Hgb A1C 13 Labs 12/16/2015: K 4.3 Creatinine 1.27    Past Medical History:  Diagnosis Date  . Acute on chronic systolic heart failure, NYHA class 3 ([place])   . AKI (acute kidney injury) ([place])   . Anemia   . BKA stump complication ([place])   . Blind left eye   . Cardiomyopathy, dilated ([place])   . Combined systolic and  diastolic congestive heart failure ([place])   . Congestive dilated cardiomyopathy ([place]) 07/11/2015  . Diabetes mellitus   . Diabetic neuropathy associated with type 2 diabetes mellitus ([place])   . Hypertension   . Noncompliance with medications   . Open knee wound 10/2015   on rt bka  . PAD (peripheral artery disease) ([place])   . Protein calorie malnutrition ([place])   . Shortness of breath dyspnea     Current Outpatient Prescriptions  Medication Sig Dispense Refill  . ACCU-CHEK AVIVA PLUS test strip CHECK SUGAR 3 TIMES DAILY 100 each 3  . atorvastatin (LIPITOR) 40 MG tablet Take 1 tablet (40 mg total) by mouth daily. 90 tablet 1  . brimonidine (ALPHAGAN) 0.2 % ophthalmic solution PLACE 1 DROP INTO THE LEFT EYE 2 (TWO) TIMES DAILY. 5 mL 0  . carvedilol (COREG) 12.5 MG tablet Take 1 tablet (12.5 mg total) by mouth 2 (two) times daily with a meal. 60 tablet 6  . cholecalciferol (VITAMIN D) 1000 units tablet Take 1,000 Units by mouth daily.    . dorzolamide-timolol (COSOPT) 22.3-6.8 MG/ML ophthalmic solution Place 1 drop into the left eye 2 (two) times daily. 10 mL 12  . furosemide (LASIX) 80 MG tablet Take 1 tablet (80 mg total) by mouth daily. 30 tablet 6  . glucose blood (ACCU-CHEK ACTIVE STRIPS) test strip Use as instructed 100 each 12  .  insulin glargine (LANTUS) 100 UNIT/ML injection Inject 15 Units into the skin at bedtime.    . metFORMIN (GLUCOPHAGE) 500 MG tablet Take 1 tablet (500 mg total) by mouth 2 (two) times daily with a meal. 60 tablet 6  . potassium chloride SA (K-DUR,KLOR-CON) 20 MEQ tablet Take 20 mEq by mouth 2 (two) times daily.    . sacubitril-valsartan (ENTRESTO) 97-103 MG Take 1 tablet by mouth 2 (two) times daily. 60 tablet 6  . vitamin A 10000 UNIT capsule Take 10,000 Units by mouth daily.     No current facility-administered medications for this encounter.     No Known Allergies    Social History   Social History  . Marital status: Married    Spouse name: N/A  .  Number of children: N/A  . Years of education: N/A   Occupational History  . Not on file.   Social History Main Topics  . Smoking status: Former Smoker    Quit date: 05/10/2000  . Smokeless tobacco: Former Systems developer    Quit date: 02/22/2000  . Alcohol use No  . Drug use: No  . Sexual activity: Not on file   Other Topics Concern  . Not on file   Social History Narrative   ** Merged History Encounter **       Lives alone.       Family History  Problem Relation Age of Onset  . Cancer Mother   . Peripheral vascular disease Father     Vitals:   05/08/16 1012  BP: (!) 176/106  Pulse: 72  SpO2: 92%  Weight: 209 lb 6 oz (95 kg)   Wt Readings from Last 3 Encounters:  05/08/16 209 lb 6 oz (95 kg)  05/04/16 205 lb (93 kg)  04/27/16 207 lb (93.9 kg)     PHYSICAL EXAM: General: Ambulated into clinic without difficulty. [name] with Paramedic present.  HEENT: Normal Neck: supple. JVD 8-9 cm. Carotids 2+ bilat; no bruits. No thyromegaly or nodule noted.  Cor: PMI nondisplaced. RRR. No murmurs noted. + S3 Lungs: Clear, normal effort Abdomen: soft, NT, ND, no HSM. No bruits or masses. +BS  Extremities: no cyanosis, clubbing, rash, edema. RLE BKA.  Neuro: alert & oriented x 3, cranial nerves grossly intact. moves all 4 extremities w/o difficulty. Affect flat .   ASSESSMENT & PLAN: 1. Chronic systolic HF due to NICM. 07/2015 ECHo EF 15%. Plan to repeat ECHO after HF meds optimized.  NYHA II. - Continue to struggle with non-compliance. Paramedicine following. Taking medications up to 2 times a week only.  - Long discussion about compliance today. Wants to try taking meds for 1-2 weeks daily to see how he feels.  To encouraged compliance, will cut back on his doses so he has less of a sudden effect as he is non-compliant for the most part.  - Volume status mildly elevated by JVP. Encouraged daily lasix compliance with 80 mg lasix daily and 40 meq potassium daily.  - Continue Coreg 12.5  mg twice a day.  - Decrease Entresto to 24/26 mg BID to encouraged compliance. - Reinforced fluid restriction to < 2 L daily, sodium restriction to less than 2000 mg daily, and the importance of daily weights.   2. HTN - Meds as above.  3. DM2, poorly controlled- Hgb A1C 13. Having difficulty taking insulin due to impaired vision.   - Needs to follow up with PCP.    4. PAD s/p R BKA-  - Sees [name].  5. CKD stage 3  - BMET today.   6. Social- Geophysicist/field seismologist.   Meds as above. Long talk about compliance.  Close follow up with [place] and paramedicine. Patient is aware that he is at risk of being discharged from clinic with continued non-compliance.   Jake Mccallum Tillery, PA-C  10:30 AM  Total time spent >: 25 minutes. Over half that spent discussing the above.

## 2016-05-08 NOTE — Progress Notes (Signed)
Medication Samples have been provided to the patient.  Drug name: Delene Loll       Strength: 24/26        Qty: 2  LOT: [id number]  Exp.Date: 11/18  Dosing instructions: 1 tab Twice daily   The patient has been instructed regarding the correct time, dose, and frequency of taking this medication, including desired effects and most common side effects.   Heather Schub 10:44 AM 05/08/2016

## 2016-05-08 NOTE — Progress Notes (Addendum)
Advanced Heart Failure Medication Review by a Pharmacist  Does the patient  feel that his/her medications are working for him/her?  Yes - concern that meds are harming him (upset stomach, HA)   Has the patient been experiencing any side effects to the medications prescribed?  no  Does the patient measure his/her own blood pressure or blood glucose at home?  yes - paramedicine checks  Does the patient have any problems obtaining medications due to transportation or finances?   no  Understanding of regimen: poor Understanding of indications: poor Potential of compliance: poor Patient understands to avoid NSAIDs. Patient understands to avoid decongestants.  Issues to address at subsequent visits: compliance, lab work (K, Scr)   Pharmacist comments: Mr. Fracasso is a pleasant 16 yoM presenting to advanced HF clinic today with [name], paramedic. He states that he's been feeling bad since his sugars are more controlled. He reports taking his medications ~2 times/week. According to his pill box filled by [name] on Friday, he has only taken Monday morning's tablets. We addressed the importance of his medications again today.   Patient's medications contained spironolactone that has been discontinued. We disposed of the tablets and called the pharmacy to discontinue the prescription.     Time with patient: 10 mins Preparation and documentation time: 2 mins Total time: 12 mins

## 2016-05-08 NOTE — Progress Notes (Signed)
Paramedicine Encounter   Patient ID: [name] , male,   DOB: 25-Dec-1955,60 y.o.,  MRN: [id number]   Met patient in clinic today with provider.  Pt did not take his meds this morning prior to appointment. He also states he did not take his evening meds yesterday either. He states that yesterday his CBG was 128 and he felt shaky and blurry vision-which I advised him that number is actually good but he isnt used to it being within normal limits. He has only taken 2 doses of meds since [facility] came out Friday to fill his pill box. Med changes at noted in provider notes. Pt agreeable to take his meds BID for 1 week to give them a chance to work with him and improve health. His entresto was swapped from the high dose to the lower dose in the office.  Time spent with patient 65mn  [name] Lynch, EMT-Paramedic 05/08/2016

## 2016-05-10 ENCOUNTER — Telehealth (HOSPITAL_COMMUNITY): Payer: Self-pay | Admitting: *Deleted

## 2016-05-10 NOTE — Telephone Encounter (Signed)
Maudie Mercury, RN with [facility] called to report patient's BP is 168/112 and another check was 166/110.  Patient's weight is also down and he has taken his lasix this morning.  She will continue to monitor.

## 2016-05-11 ENCOUNTER — Other Ambulatory Visit (HOSPITAL_COMMUNITY): Payer: Self-pay

## 2016-05-11 NOTE — Progress Notes (Signed)
Paramedicine Encounter    Patient ID: [name], male    DOB: 1955-08-26, 61 y.o.   MRN: [id number]  Patient Care Team: Steve Rattler, DO as PCP - General  Patient Active Problem List   Diagnosis Date Noted  . Chronic combined systolic and diastolic congestive heart failure ([place])   . Protein calorie malnutrition ([place]) 07/12/2015  . Congestive dilated cardiomyopathy ([place]) 07/11/2015  . Pressure ulcer 07/09/2015  . Open knee wound 07/06/2015  . Post-operative pain   . Complication of amputated stump ([place]) 04/29/2015  . History of right below knee amputation ([place]) 02/22/2015  . PAD (peripheral artery disease) ([place]) 02/22/2015  . Blindness of left eye 01/04/2015  . BKA stump complication ([place]) 45/80/9983  . Diabetic neuropathy associated with type 2 diabetes mellitus ([place]) 12/01/2013  . Noncompliance with medications 07/13/2010  . OTHER SPEC TYPES SCHIZOPHRENIA UNSPEC CONDITION 05/24/2009  . Obesity, unspecified 06/09/2007  . DM (diabetes mellitus), type 2, uncontrolled ([place]) 05/02/2006  . Essential hypertension, benign 05/02/2006    Current Outpatient Prescriptions:  .  atorvastatin (LIPITOR) 40 MG tablet, Take 1 tablet (40 mg total) by mouth daily., Disp: 90 tablet, Rfl: 1 .  carvedilol (COREG) 12.5 MG tablet, Take 1 tablet (12.5 mg total) by mouth 2 (two) times daily with a meal., Disp: 60 tablet, Rfl: 6 .  cholecalciferol (VITAMIN D) 1000 units tablet, Take 1,000 Units by mouth daily., Disp: , Rfl:  .  dorzolamide-timolol (COSOPT) 22.3-6.8 MG/ML ophthalmic solution, Place 1 drop into the left eye 2 (two) times daily., Disp: 10 mL, Rfl: 12 .  furosemide (LASIX) 80 MG tablet, Take 1 tablet (80 mg total) by mouth daily., Disp: 30 tablet, Rfl: 6 .  insulin glargine (LANTUS) 100 UNIT/ML injection, Inject 15 Units into the skin at bedtime., Disp: , Rfl:  .  metFORMIN (GLUCOPHAGE) 500 MG tablet, Take 1 tablet (500 mg total) by mouth 2 (two) times daily with a meal., Disp: 60  tablet, Rfl: 6 .  potassium chloride SA (K-DUR,KLOR-CON) 20 MEQ tablet, Take 20 mEq by mouth 2 (two) times daily., Disp: , Rfl:  .  sacubitril-valsartan (ENTRESTO) 24-26 MG, Take 1 tablet by mouth 2 (two) times daily., Disp: 60 tablet, Rfl: 3 .  vitamin A 10000 UNIT capsule, Take 10,000 Units by mouth daily., Disp: , Rfl:  .  ACCU-CHEK AVIVA PLUS test strip, CHECK SUGAR 3 TIMES DAILY, Disp: 100 each, Rfl: 3 .  brimonidine (ALPHAGAN) 0.2 % ophthalmic solution, PLACE 1 DROP INTO THE LEFT EYE 2 (TWO) TIMES DAILY., Disp: 5 mL, Rfl: 0 .  glucose blood (ACCU-CHEK ACTIVE STRIPS) test strip, Use as instructed, Disp: 100 each, Rfl: 12 No Known Allergies   Social History   Social History  . Marital status: Married    Spouse name: N/A  . Number of children: N/A  . Years of education: N/A   Occupational History  . Not on file.   Social History Main Topics  . Smoking status: Former Smoker    Quit date: 05/10/2000  . Smokeless tobacco: Former Systems developer    Quit date: 02/22/2000  . Alcohol use No  . Drug use: No  . Sexual activity: Not on file   Other Topics Concern  . Not on file   Social History Narrative   ** Merged History Encounter **       Lives alone.     Physical Exam  Eyes: Pupils are equal, round, and reactive to light.  Cardiovascular: Normal rate.   Pulmonary/Chest:  He has no wheezes. He has no rales.  Abdominal: He exhibits no distension. There is no tenderness.  Musculoskeletal: He exhibits edema.  Skin: Skin is warm and dry. He is not diaphoretic.        Future Appointments Date Time Provider [name]  05/23/2016 1:00 PM Newt Minion, MD [facility] None  05/24/2016 10:00 AM [facility] PA/NP [facility] None   ATF pt [name] x4 with no complaints today.  Pt called 911 yesterday because he had pulled on the dressing on his leg and he began to bleed.  Upon my arrival today the bleeding is controlled and pt denies pain.  Pts pill box full except for the medications from Tuesday  morning (prior to clinic app).  Pt has no explanation for not taking his medications.  Pt took this am medications during [facility] visit.  Pt denies dizziness, headaches, sob and chest pain. rx bottles and pill box verified.   * heart clinic made aware of pt's bp  BP (!) 168/110 (BP Location: Right Arm, Patient Position: Sitting, Cuff Size: Normal)   Pulse 77   Resp 16   Wt 204 lb (92.5 kg)   SpO2 98%   BMI 27.67 kg/m   cbg 269 Weight yesterday-204 Last visit weight-205    ACTION: Home visit completed

## 2016-05-14 ENCOUNTER — Telehealth (INDEPENDENT_AMBULATORY_CARE_PROVIDER_SITE_OTHER): Payer: Self-pay | Admitting: *Deleted

## 2016-05-14 ENCOUNTER — Telehealth (HOSPITAL_COMMUNITY): Payer: Self-pay | Admitting: *Deleted

## 2016-05-14 NOTE — Telephone Encounter (Signed)
[name] called to report pt's BP is very elevated today.  She states it was 174/124 then 176/122, pt refused to take his meds, she advised him to report to ER for any symptoms.    I called and spoke w/[name] w/paramedicine, she will have Dee see pt tomorrow to f/u with him.

## 2016-05-14 NOTE — Telephone Encounter (Signed)
[name] calling for verbal orders to recertify skilled nursing care, wound care [phone]

## 2016-05-14 NOTE — Telephone Encounter (Signed)
Called and lm on vm to advise vebal ok to recert pt.

## 2016-05-17 ENCOUNTER — Telehealth (HOSPITAL_COMMUNITY): Payer: Self-pay

## 2016-05-17 NOTE — Telephone Encounter (Signed)
HHRN called to report weight up 5 lbs, not SOB, lungs clear bilaterally, BP 168/118.  Reports seems to have taken medications today, pill boxes empty where appropriate and patient reports taking medications. Did have extra clothes on as he was on the way to another MD apt. Continue to monitor, no changes at this time.  Renee Pain, RN

## 2016-05-18 ENCOUNTER — Other Ambulatory Visit (HOSPITAL_COMMUNITY): Payer: Self-pay

## 2016-05-18 NOTE — Progress Notes (Signed)
Paramedicine Encounter    Patient ID: [name], male    DOB: 1955/09/30, 61 y.o.   MRN: [id number]  Patient Care Team: Steve Rattler, DO as PCP - General  Patient Active Problem List   Diagnosis Date Noted  . Chronic combined systolic and diastolic congestive heart failure ([place])   . Protein calorie malnutrition ([place]) 07/12/2015  . Congestive dilated cardiomyopathy ([place]) 07/11/2015  . Pressure ulcer 07/09/2015  . Open knee wound 07/06/2015  . Post-operative pain   . Complication of amputated stump ([place]) 04/29/2015  . History of right below knee amputation ([place]) 02/22/2015  . PAD (peripheral artery disease) ([place]) 02/22/2015  . Blindness of left eye 01/04/2015  . BKA stump complication ([place]) 99/83/3825  . Diabetic neuropathy associated with type 2 diabetes mellitus ([place]) 12/01/2013  . Noncompliance with medications 07/13/2010  . OTHER SPEC TYPES SCHIZOPHRENIA UNSPEC CONDITION 05/24/2009  . Obesity, unspecified 06/09/2007  . DM (diabetes mellitus), type 2, uncontrolled ([place]) 05/02/2006  . Essential hypertension, benign 05/02/2006    Current Outpatient Prescriptions:  .  atorvastatin (LIPITOR) 40 MG tablet, Take 1 tablet (40 mg total) by mouth daily., Disp: 90 tablet, Rfl: 1 .  carvedilol (COREG) 12.5 MG tablet, Take 1 tablet (12.5 mg total) by mouth 2 (two) times daily with a meal., Disp: 60 tablet, Rfl: 6 .  furosemide (LASIX) 80 MG tablet, Take 1 tablet (80 mg total) by mouth daily., Disp: 30 tablet, Rfl: 6 .  metFORMIN (GLUCOPHAGE) 500 MG tablet, Take 1 tablet (500 mg total) by mouth 2 (two) times daily with a meal., Disp: 60 tablet, Rfl: 6 .  ACCU-CHEK AVIVA PLUS test strip, CHECK SUGAR 3 TIMES DAILY, Disp: 100 each, Rfl: 3 .  brimonidine (ALPHAGAN) 0.2 % ophthalmic solution, PLACE 1 DROP INTO THE LEFT EYE 2 (TWO) TIMES DAILY., Disp: 5 mL, Rfl: 0 .  cholecalciferol (VITAMIN D) 1000 units tablet, Take 1,000 Units by mouth daily., Disp: , Rfl:  .  dorzolamide-timolol  (COSOPT) 22.3-6.8 MG/ML ophthalmic solution, Place 1 drop into the left eye 2 (two) times daily., Disp: 10 mL, Rfl: 12 .  glucose blood (ACCU-CHEK ACTIVE STRIPS) test strip, Use as instructed, Disp: 100 each, Rfl: 12 .  insulin glargine (LANTUS) 100 UNIT/ML injection, Inject 15 Units into the skin at bedtime., Disp: , Rfl:  .  potassium chloride SA (K-DUR,KLOR-CON) 20 MEQ tablet, Take 20 mEq by mouth 2 (two) times daily., Disp: , Rfl:  .  sacubitril-valsartan (ENTRESTO) 24-26 MG, Take 1 tablet by mouth 2 (two) times daily., Disp: 60 tablet, Rfl: 3 .  vitamin A 10000 UNIT capsule, Take 10,000 Units by mouth daily., Disp: , Rfl:  No Known Allergies   Social History   Social History  . Marital status: Married    Spouse name: N/A  . Number of children: N/A  . Years of education: N/A   Occupational History  . Not on file.   Social History Main Topics  . Smoking status: Former Smoker    Quit date: 05/10/2000  . Smokeless tobacco: Former Systems developer    Quit date: 02/22/2000  . Alcohol use No  . Drug use: No  . Sexual activity: Not on file   Other Topics Concern  . Not on file   Social History Narrative   ** Merged History Encounter **       Lives alone.     Physical Exam  Pulmonary/Chest: No respiratory distress. He has no wheezes. He has no rales.  Abdominal: He exhibits  no distension. There is no guarding.  Musculoskeletal: He exhibits no edema.  Skin: Skin is warm and dry. He is not diaphoretic.        Future Appointments Date Time Provider [place]  05/23/2016 1:00 PM Newt Minion, MD [facility] None  05/24/2016 10:00 AM [facility] PA/NP [facility] None   ATF pt [name] x4 with no complaints today.  Pt denies sob, dizziness, lightheadedness, headache and chest pain.  Pt took about 8 doses of his medications this week.  Pt had soup to eat for dinner.  I went over the nutritional facts and showed pt how to calculate his sodium intake. Pt choose the soup that was lowest in sodium and  he was advised on how to prepare it. Pt stated that he had a appointment with his case worker at [employer] yesterday. Pt stated that he will be looking for another apartment. rx bottles and pill box verified.     BP (!) 148/100 (BP Location: Right Arm, Patient Position: Sitting, Cuff Size: Normal)   Pulse 67   Resp 16   Wt 211 lb (95.7 kg)   SpO2 97%   BMI 28.62 kg/m   Weight yesterday-208 Last visit weight-204    ACTION: Home visit completed

## 2016-05-21 ENCOUNTER — Telehealth (HOSPITAL_COMMUNITY): Payer: Self-pay | Admitting: *Deleted

## 2016-05-21 NOTE — Telephone Encounter (Signed)
[name] called to report patient's BP is 156/112 and 158/116.  Patient continues to be non-compliant with medications.  [name] provided medication education.

## 2016-05-23 ENCOUNTER — Ambulatory Visit (INDEPENDENT_AMBULATORY_CARE_PROVIDER_SITE_OTHER): Payer: Medicaid Other | Admitting: Orthopedic Surgery

## 2016-05-23 DIAGNOSIS — L89892 Pressure ulcer of other site, stage 2: Secondary | ICD-10-CM

## 2016-05-23 DIAGNOSIS — Z89511 Acquired absence of right leg below knee: Secondary | ICD-10-CM

## 2016-05-23 NOTE — Progress Notes (Signed)
Office Visit Note   Patient: [name]           Date of Birth: May 14, 1955           MRN: [id number] Visit Date: 05/23/2016              Requested by: Steve Rattler, DO [place], [place] [address] PCP: Steve Rattler, DO  No chief complaint on file.   HPI: The patient is a 61 year old gentleman who presents today in follow-up for chronic ulceration to his tibial tubercle. He is status post right below the knee amputation. Has had modifications to his socket there is a cut out window over the tibial tubercle to offload pressure to the area. However he does not have any improvement in his wound.   Has been doing [place] honey dressing changes. States has an appointment with Gerald Stabs at [facility] to reevaluate socket.    Assessment & Plan: Visit Diagnoses:  1. History of right below knee amputation ([place])   2. Decubitus ulcer of right knee, stage 2     Plan: Iodosorb dressing applied today. We will apply a skin graft when he returns for his next office visit.  Follow-Up Instructions: Return in about 4 weeks (around 06/20/2016).   Ortho Exam Physical Exam  Constitutional: Appears well-developed.  Head: Normocephalic.  Eyes: EOM are normal.  Neck: Normal range of motion.  Cardiovascular: Normal rate.   Pulmonary/Chest: Effort normal.  Neurological: Is alert.  Skin: Skin is warm.  Psychiatric: Has a normal mood and affect. Right below the knee: Limb well consolidated. Continues to have ulceration over tibial tubercle. This is stable in size. There is surrounding maceration. Pink tissue with scant exudative tissue. No odor. No drainage. No surrounding erythema.   Imaging: No results found.  Labs: Lab Results  Component Value Date   HGBA1C 13.1 (H) 10/06/2015   HGBA1C 12.3 07/06/2015   HGBA1C 11.7 01/03/2015   ESRSEDRATE 114 (H) 09/23/2013   CRP 37.3 (H) 09/23/2013   REPTSTATUS 09/29/2013 FINAL 09/24/2013   REPTSTATUS 09/27/2013 FINAL 09/24/2013     GRAMSTAIN  09/24/2013    MODERATE WBC PRESENT,BOTH PMN AND MONONUCLEAR MODERATE GRAM POSITIVE COCCI IN PAIRS FEW GRAM VARIABLE ROD Performed at [facility]  09/24/2013    MODERATE WBC PRESENT,BOTH PMN AND MONONUCLEAR ABUNDANT GRAM POSITIVE COCCI IN PAIRS FEW GRAM VARIABLE ROD Performed at [facility]   CULT  09/24/2013    NO ANAEROBES ISOLATED Performed at [facility]  09/24/2013    MULTIPLE ORGANISMS PRESENT, NONE PREDOMINANT NO STAPHYLOCOCCUS AUREUS ISOLATED NO GROUP A STREP (S.PYOGENES) ISOLATED Performed at [facility]    Orders:  No orders of the defined types were placed in this encounter.  No orders of the defined types were placed in this encounter.    Procedures: No procedures performed  Clinical Data: No additional findings.  Subjective: Review of Systems  Constitutional: Negative for chills and fever.    Objective: Vital Signs: There were no vitals taken for this visit.  Specialty Comments:  No specialty comments available.  PMFS History: Patient Active Problem List   Diagnosis Date Noted  . Chronic combined systolic and diastolic congestive heart failure ([place])   . Protein calorie malnutrition ([place]) 07/12/2015  . Congestive dilated cardiomyopathy ([place]) 07/11/2015  . Pressure ulcer 07/09/2015  . Open knee wound 07/06/2015  . History of right below knee amputation ([place]) 02/22/2015  . PAD (  peripheral artery disease) ([place]) 02/22/2015  . Blindness of left eye 01/04/2015  . BKA stump complication ([place]) 38/18/2993  . Diabetic neuropathy associated with type 2 diabetes mellitus ([place]) 12/01/2013  . Noncompliance with medications 07/13/2010  . OTHER SPEC TYPES SCHIZOPHRENIA UNSPEC CONDITION 05/24/2009  . Obesity, unspecified 06/09/2007  . DM (diabetes mellitus), type 2, uncontrolled ([place]) 05/02/2006  . Essential hypertension, benign 05/02/2006   Past Medical History:  Diagnosis Date  . Acute on  chronic systolic heart failure, NYHA class 3 ([place])   . AKI (acute kidney injury) ([place])   . Anemia   . BKA stump complication ([place])   . Blind left eye   . Cardiomyopathy, dilated ([place])   . Combined systolic and diastolic congestive heart failure ([place])   . Congestive dilated cardiomyopathy ([place]) 07/11/2015  . Diabetes mellitus   . Diabetic neuropathy associated with type 2 diabetes mellitus ([place])   . Hypertension   . Noncompliance with medications   . Open knee wound 10/2015   on rt bka  . PAD (peripheral artery disease) ([place])   . Protein calorie malnutrition ([place])   . Shortness of breath dyspnea     Family History  Problem Relation Age of Onset  . Cancer Mother   . Peripheral vascular disease Father     Past Surgical History:  Procedure Laterality Date  . AMPUTATION Right 09/26/2013   Procedure: AMPUTATION BELOW KNEE;  Surgeon: Rozanna Box, MD;  Location: [facility];  Service: Orthopedics;  Laterality: Right;  . AMPUTATION Right 05/01/2015   Procedure: REVISION OF RIGHT TRANSTIBIAL AMPUTATION ;  Surgeon: Newt Minion, MD;  Location: [facility];  Service: Orthopedics;  Laterality: Right;  . CARDIAC CATHETERIZATION N/A 07/13/2015   Procedure: Right/Left Heart Cath and Coronary Angiography;  Surgeon: Jolaine Artist, MD;  Location: [facility];  Service: Cardiovascular;  Laterality: N/A;  . COLONOSCOPY    . I&D EXTREMITY Right 09/24/2013   Procedure: IRRIGATION AND DEBRIDEMENT RIGHT FOOT ULCER;  Surgeon: Renette Butters, MD;  Location: [facility];  Service: Orthopedics;  Laterality: Right;  . I&D EXTREMITY Right 09/26/2013   Procedure: Repeat IRRIGATION AND DEBRIDEMENT Right Foot Ulcer;  Surgeon: Rozanna Box, MD;  Location: [facility];  Service: Orthopedics;  Laterality: Right;  . MULTIPLE TOOTH EXTRACTIONS    . STUMP REVISION Right 04/20/2015   Procedure: Revision Right Below Knee Amputation, Apply Wound VAC;  Surgeon: Newt Minion, MD;  Location: [facility];  Service: Orthopedics;   Laterality: Right;  . TONSILLECTOMY     Social History   Occupational History  . Not on file.   Social History Main Topics  . Smoking status: Former Smoker    Quit date: 05/10/2000  . Smokeless tobacco: Former Systems developer    Quit date: 02/22/2000  . Alcohol use No  . Drug use: No  . Sexual activity: Not on file

## 2016-05-24 ENCOUNTER — Ambulatory Visit (HOSPITAL_COMMUNITY)
Admission: RE | Admit: 2016-05-24 | Discharge: 2016-05-24 | Disposition: A | Discharge status: Home / Self Care | Payer: Medicaid Other | Source: Ambulatory Visit | Attending: Internal Medicine | Admitting: Internal Medicine

## 2016-05-24 ENCOUNTER — Other Ambulatory Visit (HOSPITAL_COMMUNITY): Payer: Self-pay

## 2016-05-24 VITALS — BP 158/98 | HR 67 | Wt 218.6 lb

## 2016-05-24 DIAGNOSIS — R0602 Shortness of breath: Secondary | ICD-10-CM | POA: Insufficient documentation

## 2016-05-24 DIAGNOSIS — N183 Chronic kidney disease, stage 3 (moderate): Secondary | ICD-10-CM | POA: Insufficient documentation

## 2016-05-24 DIAGNOSIS — Z89511 Acquired absence of right leg below knee: Secondary | ICD-10-CM | POA: Diagnosis not present

## 2016-05-24 DIAGNOSIS — I420 Dilated cardiomyopathy: Secondary | ICD-10-CM | POA: Insufficient documentation

## 2016-05-24 DIAGNOSIS — Z794 Long term (current) use of insulin: Secondary | ICD-10-CM | POA: Diagnosis not present

## 2016-05-24 DIAGNOSIS — I130 Hypertensive heart and chronic kidney disease with heart failure and stage 1 through stage 4 chronic kidney disease, or unspecified chronic kidney disease: Secondary | ICD-10-CM | POA: Diagnosis not present

## 2016-05-24 DIAGNOSIS — I10 Essential (primary) hypertension: Secondary | ICD-10-CM | POA: Diagnosis not present

## 2016-05-24 DIAGNOSIS — I5042 Chronic combined systolic (congestive) and diastolic (congestive) heart failure: Secondary | ICD-10-CM | POA: Diagnosis not present

## 2016-05-24 DIAGNOSIS — Z87891 Personal history of nicotine dependence: Secondary | ICD-10-CM | POA: Diagnosis not present

## 2016-05-24 DIAGNOSIS — Z91148 Patient's other noncompliance with medication regimen for other reason: Secondary | ICD-10-CM

## 2016-05-24 DIAGNOSIS — H5462 Unqualified visual loss, left eye, normal vision right eye: Secondary | ICD-10-CM | POA: Diagnosis not present

## 2016-05-24 DIAGNOSIS — E1142 Type 2 diabetes mellitus with diabetic polyneuropathy: Secondary | ICD-10-CM

## 2016-05-24 DIAGNOSIS — E46 Unspecified protein-calorie malnutrition: Secondary | ICD-10-CM | POA: Insufficient documentation

## 2016-05-24 DIAGNOSIS — IMO0002 Reserved for concepts with insufficient information to code with codable children: Secondary | ICD-10-CM

## 2016-05-24 DIAGNOSIS — I739 Peripheral vascular disease, unspecified: Secondary | ICD-10-CM

## 2016-05-24 DIAGNOSIS — E1122 Type 2 diabetes mellitus with diabetic chronic kidney disease: Secondary | ICD-10-CM | POA: Insufficient documentation

## 2016-05-24 DIAGNOSIS — Z9114 Patient's other noncompliance with medication regimen: Secondary | ICD-10-CM | POA: Diagnosis not present

## 2016-05-24 DIAGNOSIS — E1165 Type 2 diabetes mellitus with hyperglycemia: Secondary | ICD-10-CM

## 2016-05-24 LAB — BASIC METABOLIC PANEL
Anion gap: 8 (ref 5–15)
BUN: 27 mg/dL — ABNORMAL HIGH (ref 6–20)
CO2: 27 mmol/L (ref 22–32)
Calcium: 8.6 mg/dL — ABNORMAL LOW (ref 8.9–10.3)
Chloride: 104 mmol/L (ref 101–111)
Creatinine, Ser: 1.44 mg/dL — ABNORMAL HIGH (ref 0.61–1.24)
GFR calc Af Amer: 60 mL/min — ABNORMAL LOW (ref >60)
GFR calc non Af Amer: 52 mL/min — ABNORMAL LOW (ref >60)
Glucose, Bld: 150 mg/dL — ABNORMAL HIGH (ref 65–99)
Potassium: 3.9 mmol/L (ref 3.5–5.1)
Sodium: 139 mmol/L (ref 135–145)

## 2016-05-24 LAB — BRAIN NATRIURETIC PEPTIDE: B Natriuretic Peptide: 2709.6 pg/mL — ABNORMAL HIGH (ref 0.0–100.0)

## 2016-05-24 LAB — HEMOGLOBIN A1C
Hgb A1c MFr Bld: 10.1 % — ABNORMAL HIGH (ref 4.8–5.6)
Mean Plasma Glucose: 243 mg/dL

## 2016-05-24 MED ORDER — FUROSEMIDE 80 MG PO TABS: ORAL_TABLET | ORAL | 6 refills | Status: DC

## 2016-05-24 NOTE — Progress Notes (Signed)
Paramedicine Encounter    Patient ID: [name], male    DOB: September 30, 1955, 61 y.o.   MRN: [id number]   Patient Care Team: Steve Rattler, DO as PCP - General  Patient Active Problem List   Diagnosis Date Noted  . Chronic combined systolic and diastolic congestive heart failure ([place])   . Protein calorie malnutrition ([place]) 07/12/2015  . Congestive dilated cardiomyopathy ([place]) 07/11/2015  . Pressure ulcer 07/09/2015  . Open knee wound 07/06/2015  . History of right below knee amputation ([place]) 02/22/2015  . PAD (peripheral artery disease) ([place]) 02/22/2015  . Blindness of left eye 01/04/2015  . BKA stump complication ([place]) 95/18/8416  . Diabetic neuropathy associated with type 2 diabetes mellitus ([place]) 12/01/2013  . Noncompliance with medications 07/13/2010  . OTHER SPEC TYPES SCHIZOPHRENIA UNSPEC CONDITION 05/24/2009  . Obesity, unspecified 06/09/2007  . DM (diabetes mellitus), type 2, uncontrolled ([place]) 05/02/2006  . Essential hypertension, benign 05/02/2006    Current Outpatient Prescriptions:  .  ACCU-CHEK AVIVA PLUS test strip, CHECK SUGAR 3 TIMES DAILY, Disp: 100 each, Rfl: 3 .  atorvastatin (LIPITOR) 40 MG tablet, Take 1 tablet (40 mg total) by mouth daily., Disp: 90 tablet, Rfl: 1 .  brimonidine (ALPHAGAN) 0.2 % ophthalmic solution, PLACE 1 DROP INTO THE LEFT EYE 2 (TWO) TIMES DAILY., Disp: 5 mL, Rfl: 0 .  carvedilol (COREG) 12.5 MG tablet, Take 1 tablet (12.5 mg total) by mouth 2 (two) times daily with a meal., Disp: 60 tablet, Rfl: 6 .  cholecalciferol (VITAMIN D) 1000 units tablet, Take 1,000 Units by mouth daily., Disp: , Rfl:  .  dorzolamide-timolol (COSOPT) 22.3-6.8 MG/ML ophthalmic solution, Place 1 drop into the left eye 2 (two) times daily., Disp: 10 mL, Rfl: 12 .  furosemide (LASIX) 80 MG tablet, Take 80 mg (1 tabs) in am and 40 mg (1/2 tab) in pm, Disp: 45 tablet, Rfl: 6 .  glucose blood (ACCU-CHEK ACTIVE STRIPS) test strip, Use as instructed, Disp: 100 each,  Rfl: 12 .  insulin glargine (LANTUS) 100 UNIT/ML injection, Inject 15 Units into the skin at bedtime., Disp: , Rfl:  .  metFORMIN (GLUCOPHAGE) 500 MG tablet, Take 1 tablet (500 mg total) by mouth 2 (two) times daily with a meal., Disp: 60 tablet, Rfl: 6 .  potassium chloride SA (K-DUR,KLOR-CON) 20 MEQ tablet, Take 20 mEq by mouth 2 (two) times daily., Disp: , Rfl:  .  sacubitril-valsartan (ENTRESTO) 24-26 MG, Take 1 tablet by mouth 2 (two) times daily., Disp: 60 tablet, Rfl: 3 .  vitamin A 10000 UNIT capsule, Take 10,000 Units by mouth daily., Disp: , Rfl:  No Known Allergies   Social History   Social History  . Marital status: Married    Spouse name: N/A  . Number of children: N/A  . Years of education: N/A   Occupational History  . Not on file.   Social History Main Topics  . Smoking status: Former Smoker    Quit date: 05/10/2000  . Smokeless tobacco: Former Systems developer    Quit date: 02/22/2000  . Alcohol use No  . Drug use: No  . Sexual activity: Not on file   Other Topics Concern  . Not on file   Social History Narrative   ** Merged History Encounter **       Lives alone.     Physical Exam  Pulmonary/Chest: No respiratory distress. He has no wheezes. He has no rales.  Musculoskeletal: He exhibits edema.  Future Appointments Date Time Provider [place]  06/07/2016 11:00 AM [facility] PA/NP [facility] None  06/25/2016 1:00 PM Newt Minion, MD [facility] None   CLINIC VISIT  ATF pt [name] x4 with no complaints today.  Pt took morning medications prior to coming to visit. Pt denies sob, lightheadiness, dizziness and chest pain.  I gave pt a list of houses and apartments that accept [address].  I also talked to pt about having a aide come by to help him out around the house.    Charise Carwin with pt about taking his medications on a regular basis. Pt's lasix increased. Pt's pill box filled accordingly.    BP (!) 158/98 (BP Location: Left Arm, Patient Position: Sitting)    Pulse 67   Resp 16   Wt 218 lb 9.6 oz (99.2 kg)   SpO2 96%   BMI 29.65 kg/m   Weight yesterday-214 Last visit weight-211    ACTION: Home visit completed

## 2016-05-24 NOTE — Patient Instructions (Signed)
Routine lab work today. Will notify you of abnormal results, otherwise no news is good news!  Take Lasix (Furosemide) 80 mg (1 tab) TWICE DAILY FOR TODAY AND TOMORROW. Starting on Saturday, Take 80 mg (1 tab) in am and 40 mg (1/2 tab) in pm every day.  Follow up 2 weeks with Oda Kilts PA-C.  Do the following things EVERYDAY: 1) Weigh yourself in the morning before breakfast. Write it down and keep it in a log. 2) Take your medicines as prescribed 3) Eat low salt foods-Limit salt (sodium) to 2000 mg per day.  4) Stay as active as you can everyday 5) Limit all fluids for the day to less than 2 liters

## 2016-05-24 NOTE — Progress Notes (Signed)
Advanced Heart Failure Medication Review by a Pharmacist  Does the patient  feel that his/her medications are working for him/her?  Not taking most of his medications for no particular reason  Has the patient been experiencing any side effects to the medications prescribed?  no  Does the patient measure his/her own blood pressure or blood glucose at home?  no   Does the patient have any problems obtaining medications due to transportation or finances?   no  Understanding of regimen: poor Understanding of indications: poor Potential of compliance: poor Patient understands to avoid NSAIDs. Patient understands to avoid decongestants.  Issues to address at subsequent visits: Non-compliance   Pharmacist comments: [name] is a pleasant 61 yo AA male presenting to advanced HF clinic with [name], paramedic. He presented with his medications and pill box. He only took his medications on Sun AM, Mon PM, Tues AM, Thurs AM. When asked about this, he didn't have a reason why he missed his medications.He had no questions or concerns.  Belia Heman, PharmD PGY1 Pharmacy Resident [phone] (Pager) 05/24/2016 10:19 AM   Time with patient: 10 min Preparation and documentation time: 2 min Total time: 12 min

## 2016-05-24 NOTE — Addendum Note (Signed)
Encounter addended by: Flossie Dibble, Student-Social Work on: 05/24/2016  1:58 PM<BR>    Actions taken: Sign clinical note

## 2016-05-24 NOTE — Progress Notes (Signed)
SW Intern met with paramedicine pt in clinic and discussed med compliance. Pt indicated he was not taking medications because he felt they were interfering with his eyesight. Staff assured him the medicines he was taking did not have any effect on his sight, but pt opinion remained unchanged. Paramedic asked pt if he would be interested in having an aid come to his home to assist with food prep and daily medications. Pt declined at first but after lengthy discussion with paramedic pt began to see the value in having in home assistance and agreed. SW Intern will complete referral information for pt and will remain in contact with paramedicine for follow up as needed. SW Intern [place], [place] [phone]

## 2016-05-24 NOTE — Progress Notes (Signed)
Patient ID: [name], male   DOB: 03-01-1956, 61 y.o.   MRN: [id number]   Advanced Heart Failure Clinic Note   PCP:  Jake Chick, DO Vascular: Dr. Sharol Tucker HF: Dr. Haroldine Tucker   HPI: [name] is a 61 year old male with a past medical history of HTN, DM, :PAD S/p right BKA with non-haling wound, admitted for acute HF in 5/17// Echo on 07/10/15 showed EF of 15%, diffuse hypokinesis, PA pressure elevated at 33mmHg.   Was hospitalized 5/5 - 07/20/15  Here for post hospital f/u. Diuresed 42 pounds 244->202  Admitted 8/3 through 10/10/2015 with marked volume overload in the setting of medication noncompliance. Medication regimen was simplified. Diuresed with IV lasix and transitioned to 80 mg lasix daily. Discharge weight 200 pounds.   Pt presents today for regular visit.  Weight up 9 lbs from last visit ( was up 10 lbs at previous visit). By his pill box he is missing multiple days a week.  (Missed 4 days last week, Only took medications 3 days). Thinks he can walk about 100 feet before he gets SOB. No SOB with daily activites such as changing clothes or taking baths.  States he has no particular reason that he misses his pills. Just doesn't take them.  Sister helps with bills and transportation.  Does have occasional orthopnea and bendopnea.   Cath 07/13/15  Cardiac Cath Findings: Ao = 135/87 (107) LV = 141/20/30 RA = 17 RV = 67/11/20 PA = 62/29 (41) PCW = 35 (v wave 45-50) Fick cardiac output/index = 6.0/2.7 PVR = 1.0 WU SVR = 1198  FA sat = 88% PA sat = 58%, 57%  Labs (5/17): K 4.9 Cr 1.44 Labs (09/19/2015) : K 3.9 Creatinine 1.06 Labs 10/2015: Hgb A1C 13 Labs 12/16/2015: K 4.3 Creatinine 1.27    Past Medical History:  Diagnosis Date  . Acute on chronic systolic heart failure, NYHA class 3 ([place])   . AKI (acute kidney injury) ([place])   . Anemia   . BKA stump complication ([place])   . Blind left eye   . Cardiomyopathy, dilated ([place])   . Combined systolic and diastolic congestive heart  failure ([place])   . Congestive dilated cardiomyopathy ([place]) 07/11/2015  . Diabetes mellitus   . Diabetic neuropathy associated with type 2 diabetes mellitus ([place])   . Hypertension   . Noncompliance with medications   . Open knee wound 10/2015   on rt bka  . PAD (peripheral artery disease) ([place])   . Protein calorie malnutrition ([place])   . Shortness of breath dyspnea     Current Outpatient Prescriptions  Medication Sig Dispense Refill  . ACCU-CHEK AVIVA PLUS test strip CHECK SUGAR 3 TIMES DAILY 100 each 3  . atorvastatin (LIPITOR) 40 MG tablet Take 1 tablet (40 mg total) by mouth daily. 90 tablet 1  . brimonidine (ALPHAGAN) 0.2 % ophthalmic solution PLACE 1 DROP INTO THE LEFT EYE 2 (TWO) TIMES DAILY. 5 mL 0  . carvedilol (COREG) 12.5 MG tablet Take 1 tablet (12.5 mg total) by mouth 2 (two) times daily with a meal. 60 tablet 6  . dorzolamide-timolol (COSOPT) 22.3-6.8 MG/ML ophthalmic solution Place 1 drop into the left eye 2 (two) times daily. 10 mL 12  . furosemide (LASIX) 80 MG tablet Take 1 tablet (80 mg total) by mouth daily. 30 tablet 6  . glucose blood (ACCU-CHEK ACTIVE STRIPS) test strip Use as instructed 100 each 12  . insulin glargine (LANTUS) 100 UNIT/ML injection Inject 15  Units into the skin at bedtime.    . metFORMIN (GLUCOPHAGE) 500 MG tablet Take 1 tablet (500 mg total) by mouth 2 (two) times daily with a meal. 60 tablet 6  . potassium chloride SA (K-DUR,KLOR-CON) 20 MEQ tablet Take 20 mEq by mouth 2 (two) times daily.    . sacubitril-valsartan (ENTRESTO) 24-26 MG Take 1 tablet by mouth 2 (two) times daily. 60 tablet 3  . cholecalciferol (VITAMIN D) 1000 units tablet Take 1,000 Units by mouth daily.    . vitamin A 10000 UNIT capsule Take 10,000 Units by mouth daily.     No current facility-administered medications for this encounter.     No Known Allergies    Social History   Social History  . Marital status: Married    Spouse name: N/A  . Number of children: N/A  .  Years of education: N/A   Occupational History  . Not on file.   Social History Main Topics  . Smoking status: Former Smoker    Quit date: 05/10/2000  . Smokeless tobacco: Former Systems developer    Quit date: 02/22/2000  . Alcohol use No  . Drug use: No  . Sexual activity: Not on file   Other Topics Concern  . Not on file   Social History Narrative   ** Merged History Encounter **       Lives alone.       Family History  Problem Relation Age of Onset  . Cancer Mother   . Peripheral vascular disease Father     Vitals:   05/24/16 1008  BP: (!) 158/98  Pulse: 67  SpO2: 96%  Weight: 218 lb 9.6 oz (99.2 kg)     Wt Readings from Last 3 Encounters:  05/24/16 218 lb 9.6 oz (99.2 kg)  05/24/16 218 lb 9.6 oz (99.2 kg)  05/18/16 211 lb (95.7 kg)     PHYSICAL EXAM: General: Elderly appearing. Dee with Paramedic present.  HEENT: Normal except for Blind in left eye.  Neck: supple. JVD 9-10 cm with mild HJR. Carotids 2+ bilat, no bruits. No thyromegaly or nodule noted.  Cor: PMI nondisplaced. RRR. No murmurs noted. + S3 Lungs: Mildly diminished basilar sounds.  Abdomen: soft, NT, ND, no HSM. No bruits or masses. +BS  Extremities: no cyanosis, clubbing, rash. RLE BKA  1-2+ edema up to L knee.  Neuro: Alert & oriented x 3. Cranial nerves grossly intact. Moves all 4 extremities w/o difficulty. Affect flat   ASSESSMENT & PLAN: 1. Chronic systolic HF due to NICM. 07/2015 ECHo EF 15%. Plan to repeat ECHO after HF meds optimized.  NYHA II. - Severely non-compliant.  Pt taking medications 3 times weekly, not daily.  - Volume status elevated on exam.   - Have him take lasix 80 mg BID x 2 days and then take lasix 80 mg q am and 40 mg q pm.   - Continue 40 meq potassium daily.  - No spiro or digoxin with marked non compliance.  - Continue Coreg 12.5 mg BID - Continue Entresto 24/26 mg BID. Stressed compliance.  - Reinforced fluid restriction to < 2 L daily, sodium restriction to less than  2000 mg daily, and the importance of daily weights.   2. HTN - Meds as above. Main problem is non-compliance.   3. DM2, poorly controlled- Hgb A1C 13. Having difficulty taking insulin due to impaired vision.   - Check Hgb A1C. Has not seen PCP in months. Encouraged to make follow up.  4. PAD s/p R BKA-  - Dr. Sharol Tucker saw yesterday. Stable.  5. CKD stage 3  - BMET today.  6. Social - HFSW to see today. Continue paramedicine.  7. Vision Loss - Gradual over past few months. Pt has poorly controlled diabetes and hypertension. Likely bother contributors.  I have encouraged him to follow up with optometrist.   Pt volume overloaded on exam in setting of marked non-compliance. Increase lasix as above. Pt initially open to having a Home Aide come in the house and help several times a week, but then refused to keep his privacy. Will continue to encourage him consider.   Will not uptitrate anti-hypertensives with marked non-compliance.   Shirley Friar, PA-C  10:27 AM  Greater than 50% of the 25 minute visit was spent in counseling/coordination of care regarding medication compliance and disease state education. Medications discussed with Pharm D on site.

## 2016-05-29 ENCOUNTER — Ambulatory Visit (INDEPENDENT_AMBULATORY_CARE_PROVIDER_SITE_OTHER): Payer: Medicaid Other | Admitting: Orthopedic Surgery

## 2016-05-29 ENCOUNTER — Telehealth (HOSPITAL_COMMUNITY): Payer: Self-pay | Admitting: *Deleted

## 2016-05-29 NOTE — Telephone Encounter (Signed)
[name] called to report pt's BP is a little better today, it ws 154/104 which is better than it has been, she states pt took his Lasix, Carvedilol and Entresto while she was there and he requested she leave the other pills on counter and he was going to take them today as well.

## 2016-05-31 ENCOUNTER — Ambulatory Visit (INDEPENDENT_AMBULATORY_CARE_PROVIDER_SITE_OTHER): Payer: Medicaid Other | Admitting: Orthopedic Surgery

## 2016-05-31 DIAGNOSIS — T879 Unspecified complications of amputation stump: Secondary | ICD-10-CM

## 2016-05-31 NOTE — Progress Notes (Signed)
Office Visit Note   Patient: [name]           Date of Birth: 10-Jun-1955           MRN: [id number] Visit Date: 05/31/2016              Requested by: Steve Rattler, DO [place], [place] [address] PCP: Steve Rattler, DO  No chief complaint on file.     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 He is status post below the knee amputation. He has had many modifications to his prosthetic to try to offload pressure from the area has been doing [place] honey dressing changes twice weekly with home health. Is here today to try a new wound care option. We will apply Cellarate collagen.  Assessment & Plan: Visit Diagnoses:  1. BKA stump complication ([place])     Plan: Wound cleansed today. Cellarate applied. Home health will continue with same wound care. Apply Cellarate. Do no wipe away previous cellarate. Apply nonadhesive dressing.   Follow-Up Instructions: Return for as scheduled.   Ortho Exam  Patient is alert, oriented, no adenopathy, well-dressed, normal affect, normal respiratory effort. Right below knee amputation is stable. Does have ulceration over tibial tuberosity. This is quarter sized. Is filled in with pink tissue. Surrounding nonviable tissue debrided iwht 10 blade back to viable tissue. No drainage. No odor. No surrounding erythema.  Imaging: No results found.  Labs: Lab Results  Component Value Date   HGBA1C 10.1 (H) 05/24/2016   HGBA1C 13.1 (H) 10/06/2015   HGBA1C 12.3 07/06/2015   ESRSEDRATE 114 (H) 09/23/2013   CRP 37.3 (H) 09/23/2013   REPTSTATUS 09/29/2013 FINAL 09/24/2013   REPTSTATUS 09/27/2013 FINAL 09/24/2013   GRAMSTAIN  09/24/2013    MODERATE WBC PRESENT,BOTH PMN AND MONONUCLEAR MODERATE GRAM POSITIVE COCCI IN PAIRS FEW GRAM VARIABLE ROD Performed at [facility]  09/24/2013    MODERATE WBC PRESENT,BOTH PMN AND MONONUCLEAR ABUNDANT GRAM POSITIVE  COCCI IN PAIRS FEW GRAM VARIABLE ROD Performed at [facility]   CULT  09/24/2013    NO ANAEROBES ISOLATED Performed at [facility]  09/24/2013    MULTIPLE ORGANISMS PRESENT, NONE PREDOMINANT NO STAPHYLOCOCCUS AUREUS ISOLATED NO GROUP A STREP (S.PYOGENES) ISOLATED Performed at [facility]    Orders:  No orders of the defined types were placed in this encounter.  No orders of the defined types were placed in this encounter.    Procedures: No procedures performed  Clinical Data: No additional findings.  ROS:  All other systems negative, except as noted in the HPI. Review of Systems  Constitutional: Negative for chills and fever.  Skin: Positive for wound. Negative for color change.    Objective: Vital Signs: There were no vitals taken for this visit.  Specialty Comments:  No specialty comments available.  PMFS History: Patient Active Problem List   Diagnosis Date Noted  . Chronic combined systolic and diastolic congestive heart failure ([place])   . Protein calorie malnutrition ([place]) 07/12/2015  . Congestive dilated cardiomyopathy ([place]) 07/11/2015  . Pressure ulcer 07/09/2015  . Open knee wound 07/06/2015  . History of right below knee amputation ([place]) 02/22/2015  . PAD (peripheral artery disease) ([place]) 02/22/2015  . Blindness of left eye 01/04/2015  . BKA stump complication ([place]) 15/17/6160  . Diabetic neuropathy associated with type 2 diabetes mellitus ([place]) 12/01/2013  .  Noncompliance with medications 07/13/2010  . OTHER SPEC TYPES SCHIZOPHRENIA UNSPEC CONDITION 05/24/2009  . Obesity, unspecified 06/09/2007  . DM (diabetes mellitus), type 2, uncontrolled ([place]) 05/02/2006  . Essential hypertension, benign 05/02/2006   Past Medical History:  Diagnosis Date  . Acute on chronic systolic heart failure, NYHA class 3 ([place])   . AKI (acute kidney injury) ([place])   . Anemia   . BKA stump complication ([place])   . Blind left eye   .  Cardiomyopathy, dilated ([place])   . Combined systolic and diastolic congestive heart failure ([place])   . Congestive dilated cardiomyopathy ([place]) 07/11/2015  . Diabetes mellitus   . Diabetic neuropathy associated with type 2 diabetes mellitus ([place])   . Hypertension   . Noncompliance with medications   . Open knee wound 10/2015   on rt bka  . PAD (peripheral artery disease) ([place])   . Protein calorie malnutrition ([place])   . Shortness of breath dyspnea     Family History  Problem Relation Age of Onset  . Cancer Mother   . Peripheral vascular disease Father     Past Surgical History:  Procedure Laterality Date  . AMPUTATION Right 09/26/2013   Procedure: AMPUTATION BELOW KNEE;  Surgeon: Rozanna Box, MD;  Location: [facility];  Service: Orthopedics;  Laterality: Right;  . AMPUTATION Right 05/01/2015   Procedure: REVISION OF RIGHT TRANSTIBIAL AMPUTATION ;  Surgeon: Newt Minion, MD;  Location: [facility];  Service: Orthopedics;  Laterality: Right;  . CARDIAC CATHETERIZATION N/A 07/13/2015   Procedure: Right/Left Heart Cath and Coronary Angiography;  Surgeon: Jolaine Artist, MD;  Location: [facility];  Service: Cardiovascular;  Laterality: N/A;  . COLONOSCOPY    . I&D EXTREMITY Right 09/24/2013   Procedure: IRRIGATION AND DEBRIDEMENT RIGHT FOOT ULCER;  Surgeon: Renette Butters, MD;  Location: [facility];  Service: Orthopedics;  Laterality: Right;  . I&D EXTREMITY Right 09/26/2013   Procedure: Repeat IRRIGATION AND DEBRIDEMENT Right Foot Ulcer;  Surgeon: Rozanna Box, MD;  Location: [facility];  Service: Orthopedics;  Laterality: Right;  . MULTIPLE TOOTH EXTRACTIONS    . STUMP REVISION Right 04/20/2015   Procedure: Revision Right Below Knee Amputation, Apply Wound VAC;  Surgeon: Newt Minion, MD;  Location: [facility];  Service: Orthopedics;  Laterality: Right;  . TONSILLECTOMY     Social History   Occupational History  . Not on file.   Social History Main Topics  . Smoking status: Former Smoker     Quit date: 05/10/2000  . Smokeless tobacco: Former Systems developer    Quit date: 02/22/2000  . Alcohol use No  . Drug use: No  . Sexual activity: Not on file

## 2016-06-01 ENCOUNTER — Other Ambulatory Visit (HOSPITAL_COMMUNITY): Payer: Self-pay

## 2016-06-01 ENCOUNTER — Telehealth (HOSPITAL_COMMUNITY): Payer: Self-pay | Admitting: *Deleted

## 2016-06-01 NOTE — Telephone Encounter (Signed)
[name] with paramed called to report patient's BP was 150/110 and increased edema in left lower leg.  Patient is asymptomatic with no complaints of SOB.  Patient missed several doses of his medications over the last week.  [name] educated patient again on medication compliance and if he starts having increased SOB or experiences other symptoms to call 911.  [name] will continue to follow patient.

## 2016-06-01 NOTE — Progress Notes (Signed)
Paramedicine Encounter    Patient ID: [name], male    DOB: 1956-02-06, 61 y.o.   MRN: [id number]  ATF pt [name] x  Patient Care Team: Steve Rattler, DO as PCP - General  Patient Active Problem List   Diagnosis Date Noted  . Chronic combined systolic and diastolic congestive heart failure ([place])   . Protein calorie malnutrition ([place]) 07/12/2015  . Congestive dilated cardiomyopathy ([place]) 07/11/2015  . Pressure ulcer 07/09/2015  . Open knee wound 07/06/2015  . History of right below knee amputation ([place]) 02/22/2015  . PAD (peripheral artery disease) ([place]) 02/22/2015  . Blindness of left eye 01/04/2015  . BKA stump complication ([place]) 40/12/2723  . Diabetic neuropathy associated with type 2 diabetes mellitus ([place]) 12/01/2013  . Noncompliance with medications 07/13/2010  . OTHER SPEC TYPES SCHIZOPHRENIA UNSPEC CONDITION 05/24/2009  . Obesity, unspecified 06/09/2007  . DM (diabetes mellitus), type 2, uncontrolled ([place]) 05/02/2006  . Essential hypertension, benign 05/02/2006    Current Outpatient Prescriptions:  .  ACCU-CHEK AVIVA PLUS test strip, CHECK SUGAR 3 TIMES DAILY, Disp: 100 each, Rfl: 3 .  atorvastatin (LIPITOR) 40 MG tablet, Take 1 tablet (40 mg total) by mouth daily., Disp: 90 tablet, Rfl: 1 .  brimonidine (ALPHAGAN) 0.2 % ophthalmic solution, PLACE 1 DROP INTO THE LEFT EYE 2 (TWO) TIMES DAILY., Disp: 5 mL, Rfl: 0 .  carvedilol (COREG) 12.5 MG tablet, Take 1 tablet (12.5 mg total) by mouth 2 (two) times daily with a meal., Disp: 60 tablet, Rfl: 6 .  cholecalciferol (VITAMIN D) 1000 units tablet, Take 1,000 Units by mouth daily., Disp: , Rfl:  .  dorzolamide-timolol (COSOPT) 22.3-6.8 MG/ML ophthalmic solution, Place 1 drop into the left eye 2 (two) times daily., Disp: 10 mL, Rfl: 12 .  furosemide (LASIX) 80 MG tablet, Take 80 mg (1 tabs) in am and 40 mg (1/2 tab) in pm, Disp: 45 tablet, Rfl: 6 .  glucose blood (ACCU-CHEK ACTIVE STRIPS) test strip, Use as instructed, Disp:  100 each, Rfl: 12 .  insulin glargine (LANTUS) 100 UNIT/ML injection, Inject 15 Units into the skin at bedtime., Disp: , Rfl:  .  metFORMIN (GLUCOPHAGE) 500 MG tablet, Take 1 tablet (500 mg total) by mouth 2 (two) times daily with a meal., Disp: 60 tablet, Rfl: 6 .  potassium chloride SA (K-DUR,KLOR-CON) 20 MEQ tablet, Take 20 mEq by mouth 2 (two) times daily., Disp: , Rfl:  .  sacubitril-valsartan (ENTRESTO) 24-26 MG, Take 1 tablet by mouth 2 (two) times daily., Disp: 60 tablet, Rfl: 3 .  vitamin A 10000 UNIT capsule, Take 10,000 Units by mouth daily., Disp: , Rfl:  No Known Allergies   Social History   Social History  . Marital status: Married    Spouse name: N/A  . Number of children: N/A  . Years of education: N/A   Occupational History  . Not on file.   Social History Main Topics  . Smoking status: Former Smoker    Quit date: 05/10/2000  . Smokeless tobacco: Former Systems developer    Quit date: 02/22/2000  . Alcohol use No  . Drug use: No  . Sexual activity: Not on file   Other Topics Concern  . Not on file   Social History Narrative   ** Merged History Encounter **       Lives alone.     Physical Exam  Abdominal: He exhibits no distension. There is no tenderness. There is no guarding.  Musculoskeletal: He exhibits edema.  Edema left leg  Skin: Skin is warm and dry. He is not diaphoretic.        Future Appointments Date Time Provider [place]  06/07/2016 11:00 AM [facility] PA/NP [facility] None  06/25/2016 1:00 PM Newt Minion, MD [facility] None   ATF pt [name] x4 walking around the apartment wearing his prostitic leg.  Pt has an appointment today with specialist today.  Pt denies dizziness, sob, headaches, and chest pain.  Pt has missed most of the medication doses throughout this week. rx bottles and pill box verified.   cbg 122  Ned more entresto  BP (!) 152/110 (BP Location: Right Arm, Patient Position: Sitting, Cuff Size: Normal)   Pulse 76   Resp 16   Wt 215 lb  (97.5 kg)   SpO2 97%   BMI 29.16 kg/m   Weight yesterday-214 Last visit weight-218    ACTION: Home visit completed

## 2016-06-07 ENCOUNTER — Ambulatory Visit (HOSPITAL_COMMUNITY)
Admission: RE | Admit: 2016-06-07 | Discharge: 2016-06-07 | Disposition: A | Discharge status: Home / Self Care | Payer: Medicaid Other | Source: Ambulatory Visit | Attending: Internal Medicine | Admitting: Internal Medicine

## 2016-06-07 ENCOUNTER — Other Ambulatory Visit (HOSPITAL_COMMUNITY): Payer: Self-pay

## 2016-06-07 VITALS — BP 152/98 | HR 62 | Wt 216.2 lb

## 2016-06-07 DIAGNOSIS — I5042 Chronic combined systolic (congestive) and diastolic (congestive) heart failure: Secondary | ICD-10-CM

## 2016-06-07 DIAGNOSIS — N183 Chronic kidney disease, stage 3 unspecified: Secondary | ICD-10-CM

## 2016-06-07 DIAGNOSIS — IMO0002 Reserved for concepts with insufficient information to code with codable children: Secondary | ICD-10-CM

## 2016-06-07 DIAGNOSIS — R05 Cough: Secondary | ICD-10-CM | POA: Insufficient documentation

## 2016-06-07 DIAGNOSIS — I10 Essential (primary) hypertension: Secondary | ICD-10-CM

## 2016-06-07 DIAGNOSIS — E46 Unspecified protein-calorie malnutrition: Secondary | ICD-10-CM | POA: Diagnosis not present

## 2016-06-07 DIAGNOSIS — E1122 Type 2 diabetes mellitus with diabetic chronic kidney disease: Secondary | ICD-10-CM | POA: Diagnosis not present

## 2016-06-07 DIAGNOSIS — H5462 Unqualified visual loss, left eye, normal vision right eye: Secondary | ICD-10-CM | POA: Diagnosis not present

## 2016-06-07 DIAGNOSIS — S88011A Complete traumatic amputation at knee level, right lower leg, initial encounter: Secondary | ICD-10-CM | POA: Insufficient documentation

## 2016-06-07 DIAGNOSIS — E1165 Type 2 diabetes mellitus with hyperglycemia: Secondary | ICD-10-CM

## 2016-06-07 DIAGNOSIS — Z87891 Personal history of nicotine dependence: Secondary | ICD-10-CM | POA: Diagnosis not present

## 2016-06-07 DIAGNOSIS — E1142 Type 2 diabetes mellitus with diabetic polyneuropathy: Secondary | ICD-10-CM | POA: Diagnosis not present

## 2016-06-07 DIAGNOSIS — I739 Peripheral vascular disease, unspecified: Secondary | ICD-10-CM | POA: Diagnosis not present

## 2016-06-07 DIAGNOSIS — H5440 Blindness, one eye, unspecified eye: Secondary | ICD-10-CM

## 2016-06-07 DIAGNOSIS — Z9114 Patient's other noncompliance with medication regimen: Secondary | ICD-10-CM | POA: Diagnosis not present

## 2016-06-07 DIAGNOSIS — N1831 Chronic kidney disease, stage 3a: Secondary | ICD-10-CM | POA: Insufficient documentation

## 2016-06-07 DIAGNOSIS — Z89511 Acquired absence of right leg below knee: Secondary | ICD-10-CM | POA: Diagnosis not present

## 2016-06-07 DIAGNOSIS — I420 Dilated cardiomyopathy: Secondary | ICD-10-CM | POA: Insufficient documentation

## 2016-06-07 DIAGNOSIS — Z794 Long term (current) use of insulin: Secondary | ICD-10-CM

## 2016-06-07 DIAGNOSIS — I130 Hypertensive heart and chronic kidney disease with heart failure and stage 1 through stage 4 chronic kidney disease, or unspecified chronic kidney disease: Secondary | ICD-10-CM | POA: Insufficient documentation

## 2016-06-07 MED ORDER — SACUBITRIL-VALSARTAN 24-26 MG PO TABS: 1.0000 | ORAL_TABLET | Freq: Two times a day (BID) | ORAL | 11 refills | Status: DC

## 2016-06-07 NOTE — Progress Notes (Signed)
Patient ID: [name], male   DOB: [birth year], 61 y.o.   MRN: [id number]   Advanced Heart Failure Clinic Note   PCP:  Ethelda Chick, DO Vascular: Dr. Sharol Given HF: Dr. Haroldine Laws   HPI: [name] is a 61 year old male with a past medical history of HTN, DM, :PAD S/p right BKA with non-haling wound, admitted for acute HF in 5/17// Echo on 07/10/15 showed EF of 15%, diffuse hypokinesis, PA pressure elevated at 79mmHg.   Was hospitalized 5/5 - 07/20/15  Here for post hospital f/u. Diuresed 42 pounds 244->202  Admitted 8/3 through 10/10/2015 with marked volume overload in the setting of medication noncompliance. Medication regimen was simplified. Diuresed with IV lasix and transitioned to 80 mg lasix daily. Discharge weight 200 pounds.   Pt presents today for regular follow up. Weight down 2 lbs from last visit where we had him take extra lasix.  He states he completed that course. Paramedicine following. Pt only takes his medications 2-3 times a week due to fear of the side effects. No SOB with ADLs. Denies lightheadedness or dizziness. Sister helps with bills and transportation. Denies orthopnea. Denies N/V, diarrhea, fever or chills. Does have a mild cough with the temperature changes.   Review of systems complete and found to be negative unless listed in HPI.    Cath 07/13/15  Cardiac Cath Findings: Ao = 135/87 (107) LV = 141/20/30 RA = 17 RV = 67/11/20 PA = 62/29 (41) PCW = 35 (v wave 45-50) Fick cardiac output/index = 6.0/2.7 PVR = 1.0 WU SVR = 1198  FA sat = 88% PA sat = 58%, 57%  Labs (5/17): K 4.9 Cr 1.44 Labs (09/19/2015) : K 3.9 Creatinine 1.06 Labs 10/2015: Hgb A1C 13 Labs 12/16/2015: K 4.3 Creatinine 1.27    Past Medical History:  Diagnosis Date  . Acute on chronic systolic heart failure, NYHA class 3 ([place])   . AKI (acute kidney injury) ([place])   . Anemia   . BKA stump complication ([place])   . Blind left eye   . Cardiomyopathy, dilated ([place])   . Combined systolic and  diastolic congestive heart failure ([place])   . Congestive dilated cardiomyopathy ([place]) 07/11/2015  . Diabetes mellitus   . Diabetic neuropathy associated with type 2 diabetes mellitus ([place])   . Hypertension   . Noncompliance with medications   . Open knee wound 10/2015   on rt bka  . PAD (peripheral artery disease) ([place])   . Protein calorie malnutrition ([place])   . Shortness of breath dyspnea     Current Outpatient Prescriptions  Medication Sig Dispense Refill  . ACCU-CHEK AVIVA PLUS test strip CHECK SUGAR 3 TIMES DAILY 100 each 3  . atorvastatin (LIPITOR) 40 MG tablet Take 1 tablet (40 mg total) by mouth daily. 90 tablet 1  . brimonidine (ALPHAGAN) 0.2 % ophthalmic solution PLACE 1 DROP INTO THE LEFT EYE 2 (TWO) TIMES DAILY. 5 mL 0  . carvedilol (COREG) 12.5 MG tablet Take 1 tablet (12.5 mg total) by mouth 2 (two) times daily with a meal. 60 tablet 6  . cholecalciferol (VITAMIN D) 1000 units tablet Take 1,000 Units by mouth daily.    . dorzolamide-timolol (COSOPT) 22.3-6.8 MG/ML ophthalmic solution Place 1 drop into the left eye 2 (two) times daily. 10 mL 12  . furosemide (LASIX) 80 MG tablet Take 80 mg (1 tabs) in am and 40 mg (1/2 tab) in pm 45 tablet 6  . glucose blood (ACCU-CHEK ACTIVE STRIPS) test  strip Use as instructed 100 each 12  . insulin glargine (LANTUS) 100 UNIT/ML injection Inject 15 Units into the skin at bedtime.    . metFORMIN (GLUCOPHAGE) 500 MG tablet Take 1 tablet (500 mg total) by mouth 2 (two) times daily with a meal. 60 tablet 6  . potassium chloride SA (K-DUR,KLOR-CON) 20 MEQ tablet Take 20 mEq by mouth 2 (two) times daily.    . sacubitril-valsartan (ENTRESTO) 24-26 MG Take 1 tablet by mouth 2 (two) times daily. 60 tablet 11  . vitamin A 10000 UNIT capsule Take 10,000 Units by mouth daily.     No current facility-administered medications for this encounter.     No Known Allergies    Social History   Social History  . Marital status: Married    Spouse name:  N/A  . Number of children: N/A  . Years of education: N/A   Occupational History  . Not on file.   Social History Main Topics  . Smoking status: Former Smoker    Quit date: 05/10/2000  . Smokeless tobacco: Former Systems developer    Quit date: 02/22/2000  . Alcohol use No  . Drug use: No  . Sexual activity: Not on file   Other Topics Concern  . Not on file   Social History Narrative   ** Merged History Encounter **       Lives alone.       Family History  Problem Relation Age of Onset  . Cancer Mother   . Peripheral vascular disease Father     Vitals:   06/07/16 1048  BP: (!) 152/98  Pulse: 62  SpO2: 99%  Weight: 216 lb 3.2 oz (98.1 kg)     Wt Readings from Last 3 Encounters:  06/07/16 216 lb 3.2 oz (98.1 kg)  06/01/16 215 lb (97.5 kg)  05/24/16 218 lb 9.6 oz (99.2 kg)     PHYSICAL EXAM: General: Elderly appearing AA male in NAD.   HEENT: Normal except for chronic L eye blindness.  Neck: Supple. JVP 8-9 cm with mild HJR. Carotids 2+ bilat. No thyromegaly or nodule noted.   Cor: PMI non-displaced. RRR. No murmurs noted. + S3.  Lungs: Decreased basilar sounds.  Abdomen: Soft, NT, ND, no HSM. No bruits or masses. +BS   Extremities: No cyanosis, clubbing, or rash. RLE x/p BKA. 1-2+ edema up to L knee.   Neuro: Alert & oriented x 3. Cranial nerves grossly intact. Moves all 4 extremities w/o difficulty. Affect flat  ASSESSMENT & PLAN: 1. Chronic systolic HF due to NICM. 07/2015 ECHo EF 15%. - Can eventually plan repeat Echo.  - NYHA II symptoms per patient.  - Remains markedly non-compliant, but of note hasn't had admission or ED visit in > 6 months.  Only takes medication 1-2 times weekly.  - Volume status mildly elevated. Have stressed importance of medication compliance - Continue lasix 80 mg q am and 40 mg q pm.   - Continue 40 meq potassium daily.  - No spiro or digoxin with marked non compliance.  - Continue Coreg 12.5 mg BID - Continue Entresto 24/26 mg BID.  Stressed compliance.  -  Reinforced fluid restriction to < 2 L daily, sodium restriction to less than 2000 mg daily, and the importance of daily weights.   - No up-titration of meds with marked non-compliance. Needs to stay on the medicines he has been.  2. HTN - Meds as above. Continues to struggle with non-compliance. Continue Meds as above. Main problem  is non-compliance.   3. DM2, poorly controlled - Hgb 10.1 on 05/24/16. Encouraged to follow up with PCP.   4. PAD s/p R BKA-  - Follows with Dr. Sharol Given. No change.  5. CKD stage 3  - recent labs stable. Will not check today with no changes.  6. Social - HFSW to speak with today. Continue paramedicine.  7. Vision Loss - Have again encouraged to follow up with optometrist. He has chronic vision problems. Will ask HFSW if she is aware of any optometrists that accept Medicaid.   Stressed importance of compliance. Unable to up-titrate meds due to marked non-compliance. Will continue paramedicine. RTC 2 months or sooner with worsening symptoms.   Shirley Friar, PA-C  11:54 AM

## 2016-06-07 NOTE — Progress Notes (Signed)
Paramedicine Encounter    Patient ID: [name], male    DOB: 03/17/1955, 61 y.o.   MRN: [id number]   Patient Care Team: Steve Rattler, DO as PCP - General  Patient Active Problem List   Diagnosis Date Noted  . Chronic kidney disease (CKD), stage III (moderate) 06/07/2016  . Chronic combined systolic and diastolic congestive heart failure ([place])   . Protein calorie malnutrition ([place]) 07/12/2015  . Congestive dilated cardiomyopathy ([place]) 07/11/2015  . Pressure ulcer 07/09/2015  . Open knee wound 07/06/2015  . History of right below knee amputation ([place]) 02/22/2015  . PAD (peripheral artery disease) ([place]) 02/22/2015  . Blindness of left eye 01/04/2015  . BKA stump complication ([place]) 09/98/3382  . Diabetic neuropathy associated with type 2 diabetes mellitus ([place]) 12/01/2013  . Noncompliance with medications 07/13/2010  . OTHER SPEC TYPES SCHIZOPHRENIA UNSPEC CONDITION 05/24/2009  . Obesity, unspecified 06/09/2007  . DM (diabetes mellitus), type 2, uncontrolled ([place]) 05/02/2006  . Essential hypertension, benign 05/02/2006    Current Outpatient Prescriptions:  .  ACCU-CHEK AVIVA PLUS test strip, CHECK SUGAR 3 TIMES DAILY, Disp: 100 each, Rfl: 3 .  atorvastatin (LIPITOR) 40 MG tablet, Take 1 tablet (40 mg total) by mouth daily., Disp: 90 tablet, Rfl: 1 .  brimonidine (ALPHAGAN) 0.2 % ophthalmic solution, PLACE 1 DROP INTO THE LEFT EYE 2 (TWO) TIMES DAILY., Disp: 5 mL, Rfl: 0 .  carvedilol (COREG) 12.5 MG tablet, Take 1 tablet (12.5 mg total) by mouth 2 (two) times daily with a meal., Disp: 60 tablet, Rfl: 6 .  cholecalciferol (VITAMIN D) 1000 units tablet, Take 1,000 Units by mouth daily., Disp: , Rfl:  .  dorzolamide-timolol (COSOPT) 22.3-6.8 MG/ML ophthalmic solution, Place 1 drop into the left eye 2 (two) times daily., Disp: 10 mL, Rfl: 12 .  furosemide (LASIX) 80 MG tablet, Take 80 mg (1 tabs) in am and 40 mg (1/2 tab) in pm, Disp: 45 tablet, Rfl: 6 .  glucose blood (ACCU-CHEK  ACTIVE STRIPS) test strip, Use as instructed, Disp: 100 each, Rfl: 12 .  insulin glargine (LANTUS) 100 UNIT/ML injection, Inject 15 Units into the skin at bedtime., Disp: , Rfl:  .  metFORMIN (GLUCOPHAGE) 500 MG tablet, Take 1 tablet (500 mg total) by mouth 2 (two) times daily with a meal., Disp: 60 tablet, Rfl: 6 .  potassium chloride SA (K-DUR,KLOR-CON) 20 MEQ tablet, Take 20 mEq by mouth 2 (two) times daily., Disp: , Rfl:  .  sacubitril-valsartan (ENTRESTO) 24-26 MG, Take 1 tablet by mouth 2 (two) times daily., Disp: 60 tablet, Rfl: 11 .  vitamin A 10000 UNIT capsule, Take 10,000 Units by mouth daily., Disp: , Rfl:  No Known Allergies   Social History   Social History  . Marital status: Married    Spouse name: N/A  . Number of children: N/A  . Years of education: N/A   Occupational History  . Not on file.   Social History Main Topics  . Smoking status: Former Smoker    Quit date: 05/10/2000  . Smokeless tobacco: Former Systems developer    Quit date: 02/22/2000  . Alcohol use No  . Drug use: No  . Sexual activity: Not on file   Other Topics Concern  . Not on file   Social History Narrative   ** Merged History Encounter **       Lives alone.     Physical Exam      Future Appointments Date Time Provider [place]  06/25/2016  1:00 PM Newt Minion, MD [facility] None  08/07/2016 11:00 AM [facility] PA/NP [facility] None   Clinic visit I met with pt in the heart failure clinic. Pt has missed several medication doses per his norm.  Pt has no complaints today.  rx bottles and pill box verified. cbg:166 last night **[facility] called about entresto dosage and Erika called in 24-26 mg  BP (!) 152/98 (BP Location: Right Arm, Patient Position: Sitting)   Pulse 62   Resp 16   Wt 216 lb (98 kg)   SpO2 99%   BMI 29.29 kg/m   Weight yesterday-cant remember Last visit weight-215    ACTION: Home visit completed

## 2016-06-07 NOTE — Patient Instructions (Signed)
No changes to medication today.  No labs today.  Follow up 2 months with Oda Kilts PA-C.  Do the following things EVERYDAY: 1) Weigh yourself in the morning before breakfast. Write it down and keep it in a log. 2) Take your medicines as prescribed 3) Eat low salt foods-Limit salt (sodium) to 2000 mg per day.  4) Stay as active as you can everyday 5) Limit all fluids for the day to less than 2 liters

## 2016-06-07 NOTE — Progress Notes (Signed)
SW Intern followed up with [facility] and received confirmation of PCA referral for in home services for pt. SW Intern saw pt for paramedicine follow-up in clinic with paramedic. Pt indicated he was not taking his medication and did not plan to do so. SW Intern stressed the importance of medication compliance and pt indicated understanding but still refused to follow medication regime stating he felt the medication "was making him go blind in his other eye". Pt appears worried about possible side effects of medication and states when he takes them he can feel them "making his eyes go bad" . SW Intern/Paramedic explained the risks of not following medication schedule and pt indicated there "was nothing anyone could do" to make him take medication regularly. Pt appeared content with his current health and indicated he did not like change. SW Intern will continue to be available and coordinate with Paramedicine as needed. [place] Intern [place], [place] [phone]

## 2016-06-07 NOTE — Addendum Note (Signed)
Encounter addended by: Flossie Dibble, Student-Social Work on: 06/07/2016  4:05 PM<BR>    Actions taken: Sign clinical note

## 2016-06-11 ENCOUNTER — Telehealth (HOSPITAL_COMMUNITY): Payer: Self-pay | Admitting: Pharmacist

## 2016-06-11 NOTE — Telephone Encounter (Signed)
Entresto PA approved by [place] Medicaid through 06/02/17.   Ruta Hinds. Velva Harman, PharmD, BCPS, CPP Clinical Pharmacist Pager: [phone] Phone: [phone] 06/11/2016 12:43 PM

## 2016-06-15 ENCOUNTER — Other Ambulatory Visit (HOSPITAL_COMMUNITY): Payer: Self-pay

## 2016-06-15 NOTE — Progress Notes (Signed)
Paramedicine Encounter    Patient ID: [name], male    DOB: [birth year], 61 y.o.   MRN: [id number]    Patient Care Team: Steve Rattler, DO as PCP - General  Patient Active Problem List   Diagnosis Date Noted  . Chronic kidney disease (CKD), stage III (moderate) 06/07/2016  . Chronic combined systolic and diastolic congestive heart failure ([place])   . Protein calorie malnutrition ([place]) 07/12/2015  . Congestive dilated cardiomyopathy ([place]) 07/11/2015  . Pressure ulcer 07/09/2015  . Open knee wound 07/06/2015  . History of right below knee amputation ([place]) 02/22/2015  . PAD (peripheral artery disease) ([place]) 02/22/2015  . Blindness of left eye 01/04/2015  . BKA stump complication ([place]) 78/29/5621  . Diabetic neuropathy associated with type 2 diabetes mellitus ([place]) 12/01/2013  . Noncompliance with medications 07/13/2010  . OTHER SPEC TYPES SCHIZOPHRENIA UNSPEC CONDITION 05/24/2009  . Obesity, unspecified 06/09/2007  . DM (diabetes mellitus), type 2, uncontrolled ([place]) 05/02/2006  . Essential hypertension, benign 05/02/2006    Current Outpatient Prescriptions:  .  ACCU-CHEK AVIVA PLUS test strip, CHECK SUGAR 3 TIMES DAILY, Disp: 100 each, Rfl: 3 .  atorvastatin (LIPITOR) 40 MG tablet, Take 1 tablet (40 mg total) by mouth daily., Disp: 90 tablet, Rfl: 1 .  brimonidine (ALPHAGAN) 0.2 % ophthalmic solution, PLACE 1 DROP INTO THE LEFT EYE 2 (TWO) TIMES DAILY., Disp: 5 mL, Rfl: 0 .  carvedilol (COREG) 12.5 MG tablet, Take 1 tablet (12.5 mg total) by mouth 2 (two) times daily with a meal., Disp: 60 tablet, Rfl: 6 .  cholecalciferol (VITAMIN D) 1000 units tablet, Take 1,000 Units by mouth daily., Disp: , Rfl:  .  dorzolamide-timolol (COSOPT) 22.3-6.8 MG/ML ophthalmic solution, Place 1 drop into the left eye 2 (two) times daily., Disp: 10 mL, Rfl: 12 .  furosemide (LASIX) 80 MG tablet, Take 80 mg (1 tabs) in am and 40 mg (1/2 tab) in pm, Disp: 45 tablet, Rfl: 6 .  glucose blood  (ACCU-CHEK ACTIVE STRIPS) test strip, Use as instructed, Disp: 100 each, Rfl: 12 .  insulin glargine (LANTUS) 100 UNIT/ML injection, Inject 15 Units into the skin at bedtime., Disp: , Rfl:  .  metFORMIN (GLUCOPHAGE) 500 MG tablet, Take 1 tablet (500 mg total) by mouth 2 (two) times daily with a meal., Disp: 60 tablet, Rfl: 6 .  potassium chloride SA (K-DUR,KLOR-CON) 20 MEQ tablet, Take 20 mEq by mouth 2 (two) times daily., Disp: , Rfl:  .  sacubitril-valsartan (ENTRESTO) 24-26 MG, Take 1 tablet by mouth 2 (two) times daily., Disp: 60 tablet, Rfl: 11 .  vitamin A 10000 UNIT capsule, Take 10,000 Units by mouth daily., Disp: , Rfl:  No Known Allergies   Social History   Social History  . Marital status: Married    Spouse name: N/A  . Number of children: N/A  . Years of education: N/A   Occupational History  . Not on file.   Social History Main Topics  . Smoking status: Former Smoker    Quit date: 05/10/2000  . Smokeless tobacco: Former Systems developer    Quit date: 02/22/2000  . Alcohol use No  . Drug use: No  . Sexual activity: Not on file   Other Topics Concern  . Not on file   Social History Narrative   ** Merged History Encounter **       Lives alone.     Physical Exam  Pulmonary/Chest: No respiratory distress. He has no wheezes. He has no rales.  Abdominal: There is no tenderness. There is no guarding.  Musculoskeletal: He exhibits edema.  Skin: Skin is warm and dry. He is not diaphoretic.        Future Appointments Date Time Provider [place]  06/25/2016 1:00 PM Newt Minion, MD [facility] None  08/07/2016 11:00 AM [facility] PA/NP [facility] None    ATF pt [name] x4 sitting in his wheel chair c/o right leg pain. Pt stated that he went to the physician yesterday who gave him cream to put on his leg and bandaged up. Pt is now stating that he isnt going to appply [facility] RX anymore because he could be harming him. Pt stated that his home nurse has been giving him extra lasix on  Monday's when she comes to visit.  rx bottles verified and pill box refilled.   BP (!) 158/110 (BP Location: Right Arm, Patient Position: Sitting, Cuff Size: Normal)   Pulse 88   Resp 16   Wt 216 lb (98 kg)   SpO2 96%   BMI 29.29 kg/m   Weight yesterday-didn't weigh Last visit weight-216    Demetria Copper, EMT Paramedic 06/19/2016    ACTION: Home visit completed

## 2016-06-19 ENCOUNTER — Other Ambulatory Visit (HOSPITAL_COMMUNITY): Payer: Self-pay

## 2016-06-19 ENCOUNTER — Telehealth (HOSPITAL_COMMUNITY): Payer: Self-pay

## 2016-06-19 NOTE — Progress Notes (Signed)
[facility] visit to do vest reading. Vest reading unsuccessful (low quality) due to pt being in his wheelchair and unwilling to sit in another chair.  I will bring another chair to pt's home later this week and try again.

## 2016-06-19 NOTE — Telephone Encounter (Signed)
Jana Half @ [facility] has been trying to contact Mr. Fullen about home health care but was unable to make contact.  I called pt to advise him of the same and he told me that he spoke with someone and they will be out to his home next week.

## 2016-06-22 ENCOUNTER — Other Ambulatory Visit (HOSPITAL_COMMUNITY): Payer: Self-pay

## 2016-06-22 NOTE — Progress Notes (Signed)
Paramedicine Encounter    Patient ID: [name], male    DOB: 1955-06-06, 61 y.o.   MRN: [id number]    Patient Care Team: Steve Rattler, DO as PCP - General  Patient Active Problem List   Diagnosis Date Noted  . Chronic kidney disease (CKD), stage III (moderate) 06/07/2016  . Chronic combined systolic and diastolic congestive heart failure ([place])   . Protein calorie malnutrition ([place]) 07/12/2015  . Congestive dilated cardiomyopathy ([place]) 07/11/2015  . Pressure ulcer 07/09/2015  . Open knee wound 07/06/2015  . History of right below knee amputation ([place]) 02/22/2015  . PAD (peripheral artery disease) ([place]) 02/22/2015  . Blindness of left eye 01/04/2015  . BKA stump complication ([place]) 87/56/4332  . Diabetic neuropathy associated with type 2 diabetes mellitus ([place]) 12/01/2013  . Noncompliance with medications 07/13/2010  . OTHER SPEC TYPES SCHIZOPHRENIA UNSPEC CONDITION 05/24/2009  . Obesity, unspecified 06/09/2007  . DM (diabetes mellitus), type 2, uncontrolled ([place]) 05/02/2006  . Essential hypertension, benign 05/02/2006    Current Outpatient Prescriptions:  .  atorvastatin (LIPITOR) 40 MG tablet, Take 1 tablet (40 mg total) by mouth daily., Disp: 90 tablet, Rfl: 1 .  carvedilol (COREG) 12.5 MG tablet, Take 1 tablet (12.5 mg total) by mouth 2 (two) times daily with a meal., Disp: 60 tablet, Rfl: 6 .  cholecalciferol (VITAMIN D) 1000 units tablet, Take 1,000 Units by mouth daily., Disp: , Rfl:  .  furosemide (LASIX) 80 MG tablet, Take 80 mg (1 tabs) in am and 40 mg (1/2 tab) in pm, Disp: 45 tablet, Rfl: 6 .  metFORMIN (GLUCOPHAGE) 500 MG tablet, Take 1 tablet (500 mg total) by mouth 2 (two) times daily with a meal., Disp: 60 tablet, Rfl: 6 .  potassium chloride SA (K-DUR,KLOR-CON) 20 MEQ tablet, Take 20 mEq by mouth 2 (two) times daily., Disp: , Rfl:  .  sacubitril-valsartan (ENTRESTO) 24-26 MG, Take 1 tablet by mouth 2 (two) times daily., Disp: 60 tablet, Rfl: 11 .  vitamin A  10000 UNIT capsule, Take 10,000 Units by mouth daily., Disp: , Rfl:  .  ACCU-CHEK AVIVA PLUS test strip, CHECK SUGAR 3 TIMES DAILY, Disp: 100 each, Rfl: 3 .  brimonidine (ALPHAGAN) 0.2 % ophthalmic solution, PLACE 1 DROP INTO THE LEFT EYE 2 (TWO) TIMES DAILY., Disp: 5 mL, Rfl: 0 .  dorzolamide-timolol (COSOPT) 22.3-6.8 MG/ML ophthalmic solution, Place 1 drop into the left eye 2 (two) times daily., Disp: 10 mL, Rfl: 12 .  glucose blood (ACCU-CHEK ACTIVE STRIPS) test strip, Use as instructed, Disp: 100 each, Rfl: 12 .  insulin glargine (LANTUS) 100 UNIT/ML injection, Inject 15 Units into the skin at bedtime., Disp: , Rfl:  No Known Allergies   Social History   Social History  . Marital status: Married    Spouse name: N/A  . Number of children: N/A  . Years of education: N/A   Occupational History  . Not on file.   Social History Main Topics  . Smoking status: Former Smoker    Quit date: 05/10/2000  . Smokeless tobacco: Former Systems developer    Quit date: 02/22/2000  . Alcohol use No  . Drug use: No  . Sexual activity: Not on file   Other Topics Concern  . Not on file   Social History Narrative   ** Merged History Encounter **       Lives alone.     Physical Exam  Pulmonary/Chest: No respiratory distress. He has no wheezes. He has no rales.  Musculoskeletal: He exhibits edema.  edmea left leg   Skin: Skin is warm and dry. He is not diaphoretic.        Future Appointments Date Time Provider [place]  06/25/2016 1:00 PM Newt Minion, MD [facility] None  08/07/2016 11:00 AM [facility] PA/NP [facility] None    ATF pt [name] x4 with no complaints.  Pt reported that he has a 4 lb increased within 3 days. Pt denies sob, dizziness, headaches and chest pain. Pt stated that he has eaten frozen sausage and cheese crossants on tues and wed for breakfast; chicken tenders last night. It appears that pt has been taking extra furosemide on his own beside there were more pills in the bottle on  Tuesday when I checked and called his prescription in.  rx bottles verified and pill box refilled.  Heart failure made aware of pt's weight gain.      *fursemide not picked up therefore pt has enough until tues night/none placed in tues evening   **rx called in (again): Furosemide enresto 24-26  BP (!) 158/88 (BP Location: Left Arm, Patient Position: Sitting, Cuff Size: Normal)   Pulse 66   Resp 16   Wt 213 lb (96.6 kg)   SpO2 96%   BMI 28.89 kg/m  cbg 190 Weight yesterday-213 Last visit weight-209    Demetria Copper, EMT Paramedic 06/22/2016    ACTION: Home visit completed

## 2016-06-25 ENCOUNTER — Encounter (INDEPENDENT_AMBULATORY_CARE_PROVIDER_SITE_OTHER): Payer: Self-pay | Admitting: Orthopedic Surgery

## 2016-06-25 ENCOUNTER — Ambulatory Visit (INDEPENDENT_AMBULATORY_CARE_PROVIDER_SITE_OTHER): Payer: Medicaid Other | Admitting: Orthopedic Surgery

## 2016-06-25 ENCOUNTER — Encounter (INDEPENDENT_AMBULATORY_CARE_PROVIDER_SITE_OTHER): Payer: Self-pay

## 2016-06-25 VITALS — Ht 72.0 in | Wt 213.0 lb

## 2016-06-25 DIAGNOSIS — L89892 Pressure ulcer of other site, stage 2: Secondary | ICD-10-CM | POA: Diagnosis not present

## 2016-06-25 DIAGNOSIS — T879 Unspecified complications of amputation stump: Secondary | ICD-10-CM | POA: Diagnosis not present

## 2016-06-25 NOTE — Progress Notes (Signed)
Office Visit Note   Patient: [name]           Date of Birth: 1955/05/13           MRN: [id number] Visit Date: 06/25/2016              Requested by: Steve Rattler, DO [place],  [id number] PCP: Steve Rattler, DO  Chief Complaint  Patient presents with  . Right Leg - Follow-up    Right below the knee amputation chronic ulcer tibial tubercle      HPI: The patient is a 61 year old gentleman seen in follow-up for ulceration to the tibial tubercle right knee. Has been doing Cellarate dressing changes three times weekly with home health. Has a new prosthesis to offload the BKA ulcer. The wound is larger.  Assessment & Plan: Visit Diagnoses:  1. Decubitus ulcer of right knee, stage 2   2. BKA stump complication ([place])     Plan: We will discontinue the sella rate. We will begin using Prisma dressings. Well-dressed today and send him home with supplies for 2 more dressing changes. Update orders with home health. See him back in office in 4 more weeks. He will continue as he is now only wearing the aesthetic Absolutely necessary an hour or less a day. Advised to wear his shrinker whenever he is not in his prosthetic.  Follow-Up Instructions: Return in about 4 weeks (around 07/23/2016).   Ortho Exam  Patient is alert, oriented, no adenopathy, well-dressed, normal affect, normal respiratory effort. Tibial tubercle ulceration is now 25 x 26 mm is 2 mm deep there is red granulation tissue in the wound bed. Was debrided of surrounding nonviable tissue back to bleeding. Silver nitrate used for hemostasis. There is epibole to the wound edges. Does hav emoderate swelling to residual limb.  Imaging: No results found.  Labs: Lab Results  Component Value Date   HGBA1C 10.1 (H) 05/24/2016   HGBA1C 13.1 (H) 10/06/2015   HGBA1C 12.3 07/06/2015   ESRSEDRATE 114 (H) 09/23/2013   CRP 37.3 (H) 09/23/2013   REPTSTATUS 09/29/2013 FINAL 09/24/2013   REPTSTATUS  09/27/2013 FINAL 09/24/2013   GRAMSTAIN  09/24/2013    MODERATE WBC PRESENT,BOTH PMN AND MONONUCLEAR MODERATE GRAM POSITIVE COCCI IN PAIRS FEW GRAM VARIABLE ROD Performed at [facility]  09/24/2013    MODERATE WBC PRESENT,BOTH PMN AND MONONUCLEAR ABUNDANT GRAM POSITIVE COCCI IN PAIRS FEW GRAM VARIABLE ROD Performed at [facility]   CULT  09/24/2013    NO ANAEROBES ISOLATED Performed at [facility]  09/24/2013    MULTIPLE ORGANISMS PRESENT, NONE PREDOMINANT NO STAPHYLOCOCCUS AUREUS ISOLATED NO GROUP A STREP (S.PYOGENES) ISOLATED Performed at [facility]    Orders:  No orders of the defined types were placed in this encounter.  No orders of the defined types were placed in this encounter.    Procedures: No procedures performed  Clinical Data: No additional findings.  ROS:  All other systems negative, except as noted in the HPI. Review of Systems  Constitutional: Negative for chills and fever.  Cardiovascular: Positive for leg swelling.  Skin: Positive for wound. Negative for color change.    Objective: Vital Signs: Ht 6' (1.829 m)   Wt 213 lb (96.6 kg)   BMI 28.89 kg/m   Specialty Comments:  No specialty comments available.  PMFS History: Patient Active Problem List   Diagnosis Date Noted  .  Chronic kidney disease (CKD), stage III (moderate) 06/07/2016  . Chronic combined systolic and diastolic congestive heart failure ([place])   . Protein calorie malnutrition ([place]) 07/12/2015  . Congestive dilated cardiomyopathy ([place]) 07/11/2015  . Pressure ulcer 07/09/2015  . Open knee wound 07/06/2015  . History of right below knee amputation ([place]) 02/22/2015  . PAD (peripheral artery disease) ([place]) 02/22/2015  . Blindness of left eye 01/04/2015  . BKA stump complication ([place]) 18/56/3149  . Diabetic neuropathy associated with type 2 diabetes mellitus ([place]) 12/01/2013  . Noncompliance with medications 07/13/2010  .  OTHER SPEC TYPES SCHIZOPHRENIA UNSPEC CONDITION 05/24/2009  . Obesity, unspecified 06/09/2007  . DM (diabetes mellitus), type 2, uncontrolled ([place]) 05/02/2006  . Essential hypertension, benign 05/02/2006   Past Medical History:  Diagnosis Date  . Acute on chronic systolic heart failure, NYHA class 3 ([place])   . AKI (acute kidney injury) ([place])   . Anemia   . BKA stump complication ([place])   . Blind left eye   . Cardiomyopathy, dilated ([place])   . Combined systolic and diastolic congestive heart failure ([place])   . Congestive dilated cardiomyopathy ([place]) 07/11/2015  . Diabetes mellitus   . Diabetic neuropathy associated with type 2 diabetes mellitus ([place])   . Hypertension   . Noncompliance with medications   . Open knee wound 10/2015   on rt bka  . PAD (peripheral artery disease) ([place])   . Protein calorie malnutrition ([place])   . Shortness of breath dyspnea     Family History  Problem Relation Age of Onset  . Cancer Mother   . Peripheral vascular disease Father     Past Surgical History:  Procedure Laterality Date  . AMPUTATION Right 09/26/2013   Procedure: AMPUTATION BELOW KNEE;  Surgeon: Rozanna Box, MD;  Location: [facility];  Service: Orthopedics;  Laterality: Right;  . AMPUTATION Right 05/01/2015   Procedure: REVISION OF RIGHT TRANSTIBIAL AMPUTATION ;  Surgeon: Newt Minion, MD;  Location: [facility];  Service: Orthopedics;  Laterality: Right;  . CARDIAC CATHETERIZATION N/A 07/13/2015   Procedure: Right/Left Heart Cath and Coronary Angiography;  Surgeon: Jolaine Artist, MD;  Location: [facility];  Service: Cardiovascular;  Laterality: N/A;  . COLONOSCOPY    . I&D EXTREMITY Right 09/24/2013   Procedure: IRRIGATION AND DEBRIDEMENT RIGHT FOOT ULCER;  Surgeon: Renette Butters, MD;  Location: [facility];  Service: Orthopedics;  Laterality: Right;  . I&D EXTREMITY Right 09/26/2013   Procedure: Repeat IRRIGATION AND DEBRIDEMENT Right Foot Ulcer;  Surgeon: Rozanna Box, MD;  Location: [facility];  Service: Orthopedics;  Laterality: Right;  . MULTIPLE TOOTH EXTRACTIONS    . STUMP REVISION Right 04/20/2015   Procedure: Revision Right Below Knee Amputation, Apply Wound VAC;  Surgeon: Newt Minion, MD;  Location: [facility];  Service: Orthopedics;  Laterality: Right;  . TONSILLECTOMY     Social History   Occupational History  . Not on file.   Social History Main Topics  . Smoking status: Former Smoker    Quit date: 05/10/2000  . Smokeless tobacco: Former Systems developer    Quit date: 02/22/2000  . Alcohol use No  . Drug use: No  . Sexual activity: Not on file

## 2016-06-26 ENCOUNTER — Telehealth (INDEPENDENT_AMBULATORY_CARE_PROVIDER_SITE_OTHER): Payer: Self-pay | Admitting: Radiology

## 2016-06-26 DIAGNOSIS — T879 Unspecified complications of amputation stump: Secondary | ICD-10-CM

## 2016-06-26 DIAGNOSIS — L89892 Pressure ulcer of other site, stage 2: Secondary | ICD-10-CM

## 2016-06-26 NOTE — Telephone Encounter (Signed)
Faxing updated orders to [facility] [phone]. They are currently assisting patient with wound care. Advised patient in office [facility] will providing him with wound care supplies. Apply prisma with gauze and hypafix tape. They will file with patients insurance and deliver supplies to patients home. Referral faxed today.

## 2016-06-28 ENCOUNTER — Other Ambulatory Visit (HOSPITAL_COMMUNITY): Payer: Self-pay

## 2016-06-28 NOTE — Progress Notes (Signed)
[facility] visit scheduled for today, there's no answer at pt's door or phone.

## 2016-07-04 ENCOUNTER — Other Ambulatory Visit (HOSPITAL_COMMUNITY): Payer: Self-pay

## 2016-07-04 NOTE — Progress Notes (Signed)
Paramedicine Encounter    Patient ID: [name], male    DOB: 10/26/1955, 61 y.o.   MRN: [id number]    Patient Care Team: Steve Rattler, DO as PCP - General  Patient Active Problem List   Diagnosis Date Noted  . Chronic kidney disease (CKD), stage III (moderate) 06/07/2016  . Chronic combined systolic and diastolic congestive heart failure ([place])   . Protein calorie malnutrition ([place]) 07/12/2015  . Congestive dilated cardiomyopathy ([place]) 07/11/2015  . Pressure ulcer 07/09/2015  . Open knee wound 07/06/2015  . History of right below knee amputation ([place]) 02/22/2015  . PAD (peripheral artery disease) ([place]) 02/22/2015  . Blindness of left eye 01/04/2015  . BKA stump complication ([place]) 67/34/1937  . Diabetic neuropathy associated with type 2 diabetes mellitus ([place]) 12/01/2013  . Noncompliance with medications 07/13/2010  . OTHER SPEC TYPES SCHIZOPHRENIA UNSPEC CONDITION 05/24/2009  . Obesity, unspecified 06/09/2007  . DM (diabetes mellitus), type 2, uncontrolled ([place]) 05/02/2006  . Essential hypertension, benign 05/02/2006    Current Outpatient Prescriptions:  .  atorvastatin (LIPITOR) 40 MG tablet, Take 1 tablet (40 mg total) by mouth daily., Disp: 90 tablet, Rfl: 1 .  carvedilol (COREG) 12.5 MG tablet, Take 1 tablet (12.5 mg total) by mouth 2 (two) times daily with a meal., Disp: 60 tablet, Rfl: 6 .  cholecalciferol (VITAMIN D) 1000 units tablet, Take 1,000 Units by mouth daily., Disp: , Rfl:  .  furosemide (LASIX) 80 MG tablet, Take 80 mg (1 tabs) in am and 40 mg (1/2 tab) in pm, Disp: 45 tablet, Rfl: 6 .  glucose blood (ACCU-CHEK ACTIVE STRIPS) test strip, Use as instructed, Disp: 100 each, Rfl: 12 .  insulin glargine (LANTUS) 100 UNIT/ML injection, Inject 15 Units into the skin at bedtime., Disp: , Rfl:  .  metFORMIN (GLUCOPHAGE) 500 MG tablet, Take 1 tablet (500 mg total) by mouth 2 (two) times daily with a meal., Disp: 60 tablet, Rfl: 6 .  potassium chloride SA  (K-DUR,KLOR-CON) 20 MEQ tablet, Take 20 mEq by mouth 2 (two) times daily., Disp: , Rfl:  .  sacubitril-valsartan (ENTRESTO) 24-26 MG, Take 1 tablet by mouth 2 (two) times daily., Disp: 60 tablet, Rfl: 11 .  vitamin A 10000 UNIT capsule, Take 10,000 Units by mouth daily., Disp: , Rfl:  .  ACCU-CHEK AVIVA PLUS test strip, CHECK SUGAR 3 TIMES DAILY, Disp: 100 each, Rfl: 3 .  brimonidine (ALPHAGAN) 0.2 % ophthalmic solution, PLACE 1 DROP INTO THE LEFT EYE 2 (TWO) TIMES DAILY., Disp: 5 mL, Rfl: 0 .  dorzolamide-timolol (COSOPT) 22.3-6.8 MG/ML ophthalmic solution, Place 1 drop into the left eye 2 (two) times daily., Disp: 10 mL, Rfl: 12 No Known Allergies   Social History   Social History  . Marital status: Married    Spouse name: N/A  . Number of children: N/A  . Years of education: N/A   Occupational History  . Not on file.   Social History Main Topics  . Smoking status: Former Smoker    Quit date: 05/10/2000  . Smokeless tobacco: Former Systems developer    Quit date: 02/22/2000  . Alcohol use No  . Drug use: No  . Sexual activity: Not on file   Other Topics Concern  . Not on file   Social History Narrative   ** Merged History Encounter **       Lives alone.     Physical Exam  Eyes: Pupils are equal, round, and reactive to light.  Pulmonary/Chest: No  respiratory distress. He has no wheezes. He has no rales.  Abdominal: He exhibits no distension. There is no tenderness. There is no guarding.  Musculoskeletal: He exhibits no edema.  Skin: Skin is warm and dry. He is not diaphoretic.        Future Appointments Date Time Provider [place]  07/23/2016 12:45 PM Newt Minion, MD [facility] None  08/07/2016 11:00 AM [facility] PA/NP [facility] None    ATF pt [name] x4 in his wheelchair, with no complaints.  Pt stated that a aide from [facility]" started today.  Pt has missed several doses of medications within the past week.  Pt stated that he has been taking extra furosemide out of  the bottle which I advised against (4).  Pt has not been recording his weights.  Pt denies sob, dizziness, headaches, and chest pain. Pt's left leg has open sores and has puss coming out. rx bottles verified and pill box filled. I advised heart clinic in person about pts bp and medication non-compliance.   Levada Dy artis with [facility] mon, wed, fri 10-12  Except Friday she leaves 11:30. Cooks and cleans, runs errands if needed.) [facility] will start coming to dress pt's leg. Pt's need assistance with insulin refills due to poor eye site .    BP (!) 160/104 (BP Location: Right Arm, Patient Position: Sitting, Cuff Size: Normal)   Pulse 80   Resp 16   Wt 217 lb (98.4 kg)   SpO2 98%   BMI 29.43 kg/m   Weight yesterday-215 Last visit weight-213    Demetria Copper, EMT Paramedic 07/04/2016    ACTION: Home visit completed

## 2016-07-09 ENCOUNTER — Telehealth (INDEPENDENT_AMBULATORY_CARE_PROVIDER_SITE_OTHER): Payer: Self-pay | Admitting: Orthopedic Surgery

## 2016-07-09 ENCOUNTER — Telehealth (HOSPITAL_COMMUNITY): Payer: Self-pay | Admitting: *Deleted

## 2016-07-09 NOTE — Telephone Encounter (Signed)
Kem with [facility] called reporting pts bp 158/112 and 160/118 weight up and not urinating as much. Patients said he is taking all medications as prescribed.  Message routed to [facility] for advice.

## 2016-07-09 NOTE — Telephone Encounter (Signed)
Domenic Schwab (Nurse) with [facility] called needing verbal orders to recertify for his WD care. The number to contact Maudie Mercury is [phone]

## 2016-07-11 NOTE — Telephone Encounter (Signed)
We can increase entresto to 49/51 if he is truly taking all medications as directed and willing. Has previously refused all up-titration.   Legrand Como [address] [place], [place]

## 2016-07-11 NOTE — Telephone Encounter (Signed)
I called and lm on vm to give verbal order for continued HHN wound care.

## 2016-07-12 ENCOUNTER — Telehealth: Payer: Self-pay | Admitting: Licensed Clinical Social Worker

## 2016-07-12 NOTE — Telephone Encounter (Signed)
Spoke with Icard with paramedicine she stated patient has not been taking all of his medications. She is going out to his home tomorrow and will recheck pill box/vitals and will call me with an update. No changes to meds a this time

## 2016-07-12 NOTE — Telephone Encounter (Signed)
CSW received referral from Paramedic to assist patient with services for the blind. CSW contacted B[facility] Kay [phone] to make referral. Message left and will await return call. [name], [place], [place]

## 2016-07-13 ENCOUNTER — Other Ambulatory Visit (HOSPITAL_COMMUNITY): Payer: Self-pay

## 2016-07-13 ENCOUNTER — Telehealth (HOSPITAL_COMMUNITY): Payer: Self-pay | Admitting: *Deleted

## 2016-07-13 NOTE — Telephone Encounter (Signed)
Dee called to report pt's wt is up about 7 lbs today from last week.  She states pt has not been compliant with his meds including his Lasix and has only been taking it when he felt he needed it.  She states she advised pt to take Lasix 80 mg in AM and 40 mg in PM over weekend and she will see him again on Tuesday.

## 2016-07-13 NOTE — Progress Notes (Signed)
Paramedicine Encounter    Patient ID: [name], male    DOB: Dec 14, 1955, 61 y.o.   MRN: [id number]    Patient Care Team: Steve Rattler, DO as PCP - General  Patient Active Problem List   Diagnosis Date Noted  . Chronic kidney disease (CKD), stage III (moderate) 06/07/2016  . Chronic combined systolic and diastolic congestive heart failure ([place])   . Protein calorie malnutrition ([place]) 07/12/2015  . Congestive dilated cardiomyopathy ([place]) 07/11/2015  . Pressure ulcer 07/09/2015  . Open knee wound 07/06/2015  . History of right below knee amputation ([place]) 02/22/2015  . PAD (peripheral artery disease) ([place]) 02/22/2015  . Blindness of left eye 01/04/2015  . BKA stump complication ([place]) 59/16/3846  . Diabetic neuropathy associated with type 2 diabetes mellitus ([place]) 12/01/2013  . Noncompliance with medications 07/13/2010  . OTHER SPEC TYPES SCHIZOPHRENIA UNSPEC CONDITION 05/24/2009  . Obesity, unspecified 06/09/2007  . DM (diabetes mellitus), type 2, uncontrolled ([place]) 05/02/2006  . Essential hypertension, benign 05/02/2006    Current Outpatient Prescriptions:  .  ACCU-CHEK AVIVA PLUS test strip, CHECK SUGAR 3 TIMES DAILY, Disp: 100 each, Rfl: 3 .  atorvastatin (LIPITOR) 40 MG tablet, Take 1 tablet (40 mg total) by mouth daily., Disp: 90 tablet, Rfl: 1 .  carvedilol (COREG) 12.5 MG tablet, Take 1 tablet (12.5 mg total) by mouth 2 (two) times daily with a meal., Disp: 60 tablet, Rfl: 6 .  furosemide (LASIX) 80 MG tablet, Take 80 mg (1 tabs) in am and 40 mg (1/2 tab) in pm, Disp: 45 tablet, Rfl: 6 .  metFORMIN (GLUCOPHAGE) 500 MG tablet, Take 1 tablet (500 mg total) by mouth 2 (two) times daily with a meal., Disp: 60 tablet, Rfl: 6 .  potassium chloride SA (K-DUR,KLOR-CON) 20 MEQ tablet, Take 20 mEq by mouth 2 (two) times daily., Disp: , Rfl:  .  sacubitril-valsartan (ENTRESTO) 24-26 MG, Take 1 tablet by mouth 2 (two) times daily., Disp: 60 tablet, Rfl: 11 .  vitamin A 10000 UNIT  capsule, Take 10,000 Units by mouth daily., Disp: , Rfl:  .  brimonidine (ALPHAGAN) 0.2 % ophthalmic solution, PLACE 1 DROP INTO THE LEFT EYE 2 (TWO) TIMES DAILY., Disp: 5 mL, Rfl: 0 .  cholecalciferol (VITAMIN D) 1000 units tablet, Take 1,000 Units by mouth daily., Disp: , Rfl:  .  dorzolamide-timolol (COSOPT) 22.3-6.8 MG/ML ophthalmic solution, Place 1 drop into the left eye 2 (two) times daily., Disp: 10 mL, Rfl: 12 .  glucose blood (ACCU-CHEK ACTIVE STRIPS) test strip, Use as instructed, Disp: 100 each, Rfl: 12 .  insulin glargine (LANTUS) 100 UNIT/ML injection, Inject 15 Units into the skin at bedtime., Disp: , Rfl:  No Known Allergies   Social History   Social History  . Marital status: Married    Spouse name: N/A  . Number of children: N/A  . Years of education: N/A   Occupational History  . Not on file.   Social History Main Topics  . Smoking status: Former Smoker    Quit date: 05/10/2000  . Smokeless tobacco: Former Systems developer    Quit date: 02/22/2000  . Alcohol use No  . Drug use: No  . Sexual activity: Not on file   Other Topics Concern  . Not on file   Social History Narrative   ** Merged History Encounter **       Lives alone.     Physical Exam  Pulmonary/Chest: No respiratory distress. He has no wheezes. He has no rales.  Abdominal: There is no tenderness. There is no guarding.  Musculoskeletal: He exhibits edema.  Skin: Skin is warm and dry. He is not diaphoretic.        Future Appointments Date Time Provider [place]  07/23/2016 12:45 PM Newt Minion, MD [facility] None  08/07/2016 11:00 AM [facility] PA/NP [facility] None    ATF pt [name] x4 in his home. Pt stated that he has gained weight and he doesn't know if its fat or fluid. Pt stated that he has been eating ice cream, ice, soda, and food that his home aide is cooking him. Pt is now eating 3 times a day since she is cooking for him. pt  has no headache, no dizziness, and no sob. Pt stated that his eye  sight is getting worse by the week. He said that the room seems to   be getting darker but he's worried about going back to the eye doctor because he said that surgery could leave him blind.  Pt also stated that he is not urinating much although he is not taking his medications as prescribed. Pt stated that he is taking a "extra water pill when needed" but I advised him that its not extra when he hasn't taken any for that day.  Pt was asked to take his medications for the next 3 days (at least) day and evening.  I advised him just taking furosemide is not advised due to him also need potassium and his other meds. Pt agreed. I will f/u with him Tuesday.  Heart clinic notified of pt's weight increase. rx bottles verified and pill box refilled.  Edema in left leg +3 upto knee  **rx called in: furosemide  BP (!) 144/98 (BP Location: Right Arm, Patient Position: Sitting, Cuff Size: Normal)   Pulse 67   Resp 16   Wt 224 lb (101.6 kg)   SpO2 97%   BMI 30.38 kg/m  cbg   187 Weight yesterday-221 Last visit weight-217    Demetria Copper, EMT Paramedic 07/13/2016    ACTION: Home visit completed

## 2016-07-17 ENCOUNTER — Telehealth: Payer: Self-pay | Admitting: Licensed Clinical Social Worker

## 2016-07-17 DIAGNOSIS — T8743 Infection of amputation stump, right lower extremity: Secondary | ICD-10-CM | POA: Diagnosis not present

## 2016-07-17 NOTE — Telephone Encounter (Signed)
Message left for [name] at [facility]. Awaiting return call. [name], [place], [place]

## 2016-07-19 ENCOUNTER — Telehealth: Payer: Self-pay | Admitting: Licensed Clinical Social Worker

## 2016-07-19 ENCOUNTER — Other Ambulatory Visit (HOSPITAL_COMMUNITY): Payer: Self-pay

## 2016-07-19 NOTE — Progress Notes (Signed)
Paramedicine Encounter    Patient ID: [name], male    DOB: 03/16/1955, 61 y.o.   MRN: [id number]    Patient Care Team: Steve Rattler, DO as PCP - General  Patient Active Problem List   Diagnosis Date Noted  . Chronic kidney disease (CKD), stage III (moderate) 06/07/2016  . Chronic combined systolic and diastolic congestive heart failure ([place])   . Protein calorie malnutrition ([place]) 07/12/2015  . Congestive dilated cardiomyopathy ([place]) 07/11/2015  . Pressure ulcer 07/09/2015  . Open knee wound 07/06/2015  . History of right below knee amputation ([place]) 02/22/2015  . PAD (peripheral artery disease) ([place]) 02/22/2015  . Blindness of left eye 01/04/2015  . BKA stump complication ([place]) 40/98/1191  . Diabetic neuropathy associated with type 2 diabetes mellitus ([place]) 12/01/2013  . Noncompliance with medications 07/13/2010  . OTHER SPEC TYPES SCHIZOPHRENIA UNSPEC CONDITION 05/24/2009  . Obesity, unspecified 06/09/2007  . DM (diabetes mellitus), type 2, uncontrolled ([place]) 05/02/2006  . Essential hypertension, benign 05/02/2006    Current Outpatient Prescriptions:  .  ACCU-CHEK AVIVA PLUS test strip, CHECK SUGAR 3 TIMES DAILY, Disp: 100 each, Rfl: 3 .  atorvastatin (LIPITOR) 40 MG tablet, Take 1 tablet (40 mg total) by mouth daily., Disp: 90 tablet, Rfl: 1 .  carvedilol (COREG) 12.5 MG tablet, Take 1 tablet (12.5 mg total) by mouth 2 (two) times daily with a meal., Disp: 60 tablet, Rfl: 6 .  furosemide (LASIX) 80 MG tablet, Take 80 mg (1 tabs) in am and 40 mg (1/2 tab) in pm, Disp: 45 tablet, Rfl: 6 .  metFORMIN (GLUCOPHAGE) 500 MG tablet, Take 1 tablet (500 mg total) by mouth 2 (two) times daily with a meal., Disp: 60 tablet, Rfl: 6 .  potassium chloride SA (K-DUR,KLOR-CON) 20 MEQ tablet, Take 20 mEq by mouth 2 (two) times daily., Disp: , Rfl:  .  sacubitril-valsartan (ENTRESTO) 24-26 MG, Take 1 tablet by mouth 2 (two) times daily., Disp: 60 tablet, Rfl: 11 .  vitamin A 10000 UNIT  capsule, Take 10,000 Units by mouth daily., Disp: , Rfl:  .  brimonidine (ALPHAGAN) 0.2 % ophthalmic solution, PLACE 1 DROP INTO THE LEFT EYE 2 (TWO) TIMES DAILY., Disp: 5 mL, Rfl: 0 .  cholecalciferol (VITAMIN D) 1000 units tablet, Take 1,000 Units by mouth daily., Disp: , Rfl:  .  dorzolamide-timolol (COSOPT) 22.3-6.8 MG/ML ophthalmic solution, Place 1 drop into the left eye 2 (two) times daily., Disp: 10 mL, Rfl: 12 .  glucose blood (ACCU-CHEK ACTIVE STRIPS) test strip, Use as instructed, Disp: 100 each, Rfl: 12 .  insulin glargine (LANTUS) 100 UNIT/ML injection, Inject 15 Units into the skin at bedtime., Disp: , Rfl:  No Known Allergies   Social History   Social History  . Marital status: Married    Spouse name: N/A  . Number of children: N/A  . Years of education: N/A   Occupational History  . Not on file.   Social History Main Topics  . Smoking status: Former Smoker    Quit date: 05/10/2000  . Smokeless tobacco: Former Systems developer    Quit date: 02/22/2000  . Alcohol use No  . Drug use: No  . Sexual activity: Not on file   Other Topics Concern  . Not on file   Social History Narrative   ** Merged History Encounter **       Lives alone.     Physical Exam  Pulmonary/Chest: No respiratory distress. He has no wheezes. He has no rales.  Abdominal: He exhibits no distension. There is no rebound and no guarding.  Musculoskeletal: He exhibits edema.  Edema in left leg and right upper leg   Skin: Skin is warm and dry. He is not diaphoretic.        Future Appointments Date Time Provider [place]  07/23/2016 12:45 PM Newt Minion, MD [facility] None  08/07/2016 11:00 AM [facility] PA/NP [facility] None    ATF pt [name] x4 sitting in his wheelchair.  [facility] from [facility] (kim)  is also on scene.  She reports that her Education officer, museum and team will be having a meeting about mr. Kolk tomorrow. She stated that they will be discussing pallative care for him.  She  stated that she will be continuing to care for him for another 2-3 weeks.  She also will continue to "fix" insulin pins.  I advised her that Kennyth Lose is working closely with him to get him additional assistance.  She advised me that she is aware of pt taking furosemide directly from the bottles but she is not encouraging him to take the other medications.  I explained to her the importance of all of his medications that he is currenlty prescribed.   Pt stated that he feels good today. Pt has a move out date of May 31, his new address is [address]  Pt still has a home aide that is coming over to cook and clean for pt 3 times a week. He stated that his wheelchair isn't rolling correctly, the wheels are moving against each other.  He's had this same wheelchair since  2015. Pt's cbg @ 68 therefore I warmed up his food that his care taker fixed for him yesterday.  He stated that he hadn't eaten today.  He was eating and drinking when I left.  Pt denies sob, dizziness, headache and chest pain.  rx bottles verified and pill box refilled.  rx called in: Furosemide potassium cbg 68  BP (!) 158/100 (BP Location: Right Arm, Patient Position: Sitting, Cuff Size: Normal)   Pulse 63   Resp 16   Wt 225 lb (102.1 kg)   SpO2 97%   BMI 30.52 kg/m   Weight yesterday-didn't weigh yesterday  Last visit weight-224    Demetria Copper, EMT Paramedic 07/19/2016    ACTION: Home visit completed Next visit planned for next thursday

## 2016-07-19 NOTE — Telephone Encounter (Signed)
CSW received return call from [employer] from [facility] [employer] [facility]. She confirmed patient already seen by her and has had home assessment. CSW shared concerns about ability to draw up insulin due to limited eyesight. Services for the [facility] unable to provide any medical interventions although did provide patient with a magnifying glass to assist with medications. CSW will follow up with paramedic to determine further course of support to patient. [name], [place], [place]

## 2016-07-23 ENCOUNTER — Encounter (INDEPENDENT_AMBULATORY_CARE_PROVIDER_SITE_OTHER): Payer: Self-pay | Admitting: Orthopedic Surgery

## 2016-07-23 ENCOUNTER — Ambulatory Visit (INDEPENDENT_AMBULATORY_CARE_PROVIDER_SITE_OTHER): Payer: Medicaid Other | Admitting: Orthopedic Surgery

## 2016-07-23 VITALS — Ht 72.0 in | Wt 225.0 lb

## 2016-07-23 DIAGNOSIS — T879 Unspecified complications of amputation stump: Secondary | ICD-10-CM | POA: Diagnosis not present

## 2016-07-23 NOTE — Progress Notes (Signed)
Office Visit Note   Patient: [name]           Date of Birth: [birth year]           MRN: [id number] Visit Date: 07/23/2016              Requested by: Steve Rattler, DO [place], [place] [id number] PCP: Steve Rattler, DO  Chief Complaint  Patient presents with  . Right Leg - Wound Check    BKA ulceration tib tubercle       HPI:  patient is a [age]-year-old gentleman right transtibial amputation who is been undergoing dressing changes for his ulcer over the tibial tubercle. Patient has not been able to be compliant with nonweightbearing or with dressing changes and presents with full weightbearing with no dressing over the ulcer.  Assessment & Plan: Visit Diagnoses:  1. Complications, amputation stump late [facility])     Plan:  We will plan for split-thickness skin graft and then admission to a rehabilitation facility. Patient does not have assistance at home he will need to get this healed prior to returning home. We will also need hanger orthotics to manufacture a new socket to unload pressure from this area.  Follow-Up Instructions: Return in about 2 weeks (around 08/06/2016).   Ortho Exam  Patient is alert, oriented, no adenopathy, well-dressed, normal affect, normal respiratory effort.  on examination patient has an antalgic gait. Examination the end of the residual limb has good consolidation with no ulcers. He has developed an ulcer over the tibial tubercle this measures 3 x 4 cm and is 3 mm deep there is no exposed tendon there is flat pink tissue. There is no cellulitis.  Imaging: No results found.  Labs: Lab Results  Component Value Date   HGBA1C 10.1 (H) 05/24/2016   HGBA1C 13.1 (H) 10/06/2015   HGBA1C 12.3 07/06/2015   ESRSEDRATE 114 (H) 09/23/2013   CRP 37.3 (H) 09/23/2013   REPTSTATUS 09/29/2013 FINAL 09/24/2013   REPTSTATUS 09/27/2013 FINAL 09/24/2013   GRAMSTAIN  09/24/2013    MODERATE WBC PRESENT,BOTH PMN AND MONONUCLEAR MODERATE GRAM  POSITIVE COCCI IN PAIRS FEW GRAM VARIABLE ROD Performed at [place]  09/24/2013    MODERATE WBC PRESENT,BOTH PMN AND MONONUCLEAR ABUNDANT GRAM POSITIVE COCCI IN PAIRS FEW GRAM VARIABLE ROD Performed at [facility]   CULT  09/24/2013    NO ANAEROBES ISOLATED Performed at [place]  09/24/2013    MULTIPLE ORGANISMS PRESENT, NONE PREDOMINANT NO STAPHYLOCOCCUS AUREUS ISOLATED NO GROUP A STREP (S.PYOGENES) ISOLATED Performed at [facility]    Orders:  No orders of the defined types were placed in this encounter.  No orders of the defined types were placed in this encounter.    Procedures: No procedures performed  Clinical Data: No additional findings.  ROS:  All other systems negative, except as noted in the HPI. Review of Systems  Objective: Vital Signs: Ht 6' (1.829 m)   Wt 225 lb (102.1 kg)   BMI 30.52 kg/m   Specialty Comments:  No specialty comments available.  PMFS History: Patient Active Problem List   Diagnosis Date Noted  . Chronic kidney disease (CKD), stage III (moderate) 06/07/2016  . Chronic combined systolic and diastolic congestive heart failure ([place])   . Protein calorie malnutrition ([place]) 07/12/2015  . Congestive dilated cardiomyopathy ([place]) 07/11/2015  . Pressure ulcer 07/09/2015  . Open knee wound 07/06/2015  .  History of right below knee amputation ([place]) 02/22/2015  . PAD (peripheral artery disease) ([place]) 02/22/2015  . Blindness of left eye 01/04/2015  . Complications, amputation stump late ([place]) 08/06/2014  . Diabetic neuropathy associated with type 2 diabetes mellitus ([place]) 12/01/2013  . Noncompliance with medications 07/13/2010  . OTHER SPEC TYPES SCHIZOPHRENIA UNSPEC CONDITION 05/24/2009  . Obesity, unspecified 06/09/2007  . DM (diabetes mellitus), type 2, uncontrolled ([place]) 05/02/2006  . Essential hypertension, benign 05/02/2006   Past Medical History:  Diagnosis Date  .  Acute on chronic systolic heart failure, NYHA class 3 ([place])   . AKI (acute kidney injury) ([place])   . Anemia   . BKA stump complication ([place])   . Blind left eye   . Cardiomyopathy, dilated ([place])   . Combined systolic and diastolic congestive heart failure ([place])   . Congestive dilated cardiomyopathy ([place]) 07/11/2015  . Diabetes mellitus   . Diabetic neuropathy associated with type 2 diabetes mellitus ([place])   . Hypertension   . Noncompliance with medications   . Open knee wound 10/2015   on rt bka  . PAD (peripheral artery disease) ([place])   . Protein calorie malnutrition ([place])   . Shortness of breath dyspnea     Family History  Problem Relation Age of Onset  . Cancer Mother   . Peripheral vascular disease Father     Past Surgical History:  Procedure Laterality Date  . AMPUTATION Right 09/26/2013   Procedure: AMPUTATION BELOW KNEE;  Surgeon: Rozanna Box, MD;  Location: [facility];  Service: Orthopedics;  Laterality: Right;  . AMPUTATION Right 05/01/2015   Procedure: REVISION OF RIGHT TRANSTIBIAL AMPUTATION ;  Surgeon: Newt Minion, MD;  Location: [facility];  Service: Orthopedics;  Laterality: Right;  . CARDIAC CATHETERIZATION N/A 07/13/2015   Procedure: Right/Left Heart Cath and Coronary Angiography;  Surgeon: Jolaine Artist, MD;  Location: [facility] CV LAB;  Service: Cardiovascular;  Laterality: N/A;  . COLONOSCOPY    . I&D EXTREMITY Right 09/24/2013   Procedure: IRRIGATION AND DEBRIDEMENT RIGHT FOOT ULCER;  Surgeon: Renette Butters, MD;  Location: [facility];  Service: Orthopedics;  Laterality: Right;  . I&D EXTREMITY Right 09/26/2013   Procedure: Repeat IRRIGATION AND DEBRIDEMENT Right Foot Ulcer;  Surgeon: Rozanna Box, MD;  Location: [facility];  Service: Orthopedics;  Laterality: Right;  . MULTIPLE TOOTH EXTRACTIONS    . STUMP REVISION Right 04/20/2015   Procedure: Revision Right Below Knee Amputation, Apply Wound VAC;  Surgeon: Newt Minion, MD;  Location: [facility];  Service: Orthopedics;   Laterality: Right;  . TONSILLECTOMY     Social History   Occupational History  . Not on file.   Social History Main Topics  . Smoking status: Former Smoker    Quit date: 05/10/2000  . Smokeless tobacco: Former Systems developer    Quit date: 02/22/2000  . Alcohol use No  . Drug use: No  . Sexual activity: Not on file

## 2016-07-27 ENCOUNTER — Other Ambulatory Visit (HOSPITAL_COMMUNITY): Payer: Self-pay

## 2016-07-27 ENCOUNTER — Telehealth (HOSPITAL_COMMUNITY): Payer: Self-pay

## 2016-07-27 ENCOUNTER — Other Ambulatory Visit (INDEPENDENT_AMBULATORY_CARE_PROVIDER_SITE_OTHER): Payer: Self-pay | Admitting: Family

## 2016-07-27 NOTE — Telephone Encounter (Signed)
HHRN with [facility] called to report patient with increased SOB, swelling, left leg weeping. Patient has had slow increase in weight since end of last year (190 lbs in November to 224 lbs now). VO from Dr. Haroldine Laws to wrap LLE in unna boot and give extra 80 mg PO lasix. Aimee also to refill pill box and educate on compliance with meds and diet and fluid. Patient reminded of upcoming apt in 2 weeks with CHF clinic.  Renee Pain, RN

## 2016-07-27 NOTE — Progress Notes (Signed)
Paramedicine Encounter    Patient ID: [name], male    DOB: 08-Oct-1955, 61 y.o.   MRN: [id number]    Patient Care Team: Steve Rattler, DO as PCP - General  Patient Active Problem List   Diagnosis Date Noted  . Chronic kidney disease (CKD), stage III (moderate) 06/07/2016  . Chronic combined systolic and diastolic congestive heart failure ([place])   . Protein calorie malnutrition ([place]) 07/12/2015  . Congestive dilated cardiomyopathy ([place]) 07/11/2015  . Pressure ulcer 07/09/2015  . Open knee wound 07/06/2015  . History of right below knee amputation ([place]) 02/22/2015  . PAD (peripheral artery disease) ([place]) 02/22/2015  . Blindness of left eye 01/04/2015  . Complications, amputation stump late ([place]) 08/06/2014  . Diabetic neuropathy associated with type 2 diabetes mellitus ([place]) 12/01/2013  . Noncompliance with medications 07/13/2010  . OTHER SPEC TYPES SCHIZOPHRENIA UNSPEC CONDITION 05/24/2009  . Obesity, unspecified 06/09/2007  . DM (diabetes mellitus), type 2, uncontrolled ([place]) 05/02/2006  . Essential hypertension, benign 05/02/2006    Current Outpatient Prescriptions:  .  atorvastatin (LIPITOR) 40 MG tablet, Take 1 tablet (40 mg total) by mouth daily., Disp: 90 tablet, Rfl: 1 .  carvedilol (COREG) 12.5 MG tablet, Take 1 tablet (12.5 mg total) by mouth 2 (two) times daily with a meal., Disp: 60 tablet, Rfl: 6 .  furosemide (LASIX) 80 MG tablet, Take 80 mg (1 tabs) in am and 40 mg (1/2 tab) in pm, Disp: 45 tablet, Rfl: 6 .  metFORMIN (GLUCOPHAGE) 500 MG tablet, Take 1 tablet (500 mg total) by mouth 2 (two) times daily with a meal., Disp: 60 tablet, Rfl: 6 .  potassium chloride SA (K-DUR,KLOR-CON) 20 MEQ tablet, Take 20 mEq by mouth 2 (two) times daily., Disp: , Rfl:  .  sacubitril-valsartan (ENTRESTO) 24-26 MG, Take 1 tablet by mouth 2 (two) times daily., Disp: 60 tablet, Rfl: 11 .  ACCU-CHEK AVIVA PLUS test strip, CHECK SUGAR 3 TIMES DAILY, Disp: 100 each, Rfl: 3 .   brimonidine (ALPHAGAN) 0.2 % ophthalmic solution, PLACE 1 DROP INTO THE LEFT EYE 2 (TWO) TIMES DAILY., Disp: 5 mL, Rfl: 0 .  cholecalciferol (VITAMIN D) 1000 units tablet, Take 1,000 Units by mouth daily., Disp: , Rfl:  .  dorzolamide-timolol (COSOPT) 22.3-6.8 MG/ML ophthalmic solution, Place 1 drop into the left eye 2 (two) times daily., Disp: 10 mL, Rfl: 12 .  glucose blood (ACCU-CHEK ACTIVE STRIPS) test strip, Use as instructed, Disp: 100 each, Rfl: 12 .  insulin glargine (LANTUS) 100 UNIT/ML injection, Inject 15 Units into the skin at bedtime., Disp: , Rfl:  .  vitamin A 10000 UNIT capsule, Take 10,000 Units by mouth daily., Disp: , Rfl:  No Known Allergies   Social History   Social History  . Marital status: Married    Spouse name: N/A  . Number of children: N/A  . Years of education: N/A   Occupational History  . Not on file.   Social History Main Topics  . Smoking status: Former Smoker    Quit date: 05/10/2000  . Smokeless tobacco: Former Systems developer    Quit date: 02/22/2000  . Alcohol use No  . Drug use: No  . Sexual activity: Not on file   Other Topics Concern  . Not on file   Social History Narrative   ** Merged History Encounter **       Lives alone.     Physical Exam  Eyes: Pupils are equal, round, and reactive to light.  Pulmonary/Chest:  No respiratory distress.  Abdominal: He exhibits no distension. There is no tenderness. There is no guarding.  Musculoskeletal: He exhibits edema.  Skin: Skin is warm and dry. He is not diaphoretic.        Future Appointments Date Time Provider [place]  08/07/2016 11:00 AM [facility] PA/NP [facility] None    ATF pt [name] x4 [facility].  She was wrapping his left leg which she stated was leaking badly. She also called heart failure clinic and got orders to draw blood due to pt's weight increase.  Which has increased throughout the past weeks.  Pt has missed several medication doses  this past week, he only took four day meds and left all of the evening.  He stated that he has been taking furosemide and potassium directly out of the bottle.  Pt denies sob, dizziness, headache and chest pain.  He is having issues sleeping throughout the night and wakes up several times. I will f/u with Aimee and the heart clinic.  rx bottles verified and pill box refilled.   BP 132/80 (BP Location: Right Arm, Patient Position: Sitting, Cuff Size: Normal)   Pulse 88   Temp 97.9 F (36.6 C)   Resp 16   Wt 224 lb (101.6 kg)   SpO2 98%   BMI 30.38 kg/m cbg 123  Weight yesterday-doesn't remember Last visit weight-225    Demetria Copper, EMT Paramedic 07/27/2016    ACTION: Home visit completed Next visit planned for next friday

## 2016-08-03 ENCOUNTER — Other Ambulatory Visit (HOSPITAL_COMMUNITY): Payer: Self-pay

## 2016-08-03 ENCOUNTER — Other Ambulatory Visit (HOSPITAL_COMMUNITY): Payer: Self-pay | Admitting: Internal Medicine

## 2016-08-03 NOTE — Progress Notes (Signed)
Paramedicine Encounter    Patient ID: [name], male    DOB: Jan 11, 1956, 61 y.o.   MRN: [id number]    Patient Care Team: Steve Rattler, DO as PCP - General  Patient Active Problem List   Diagnosis Date Noted  . Chronic kidney disease (CKD), stage III (moderate) 06/07/2016  . Chronic combined systolic and diastolic congestive heart failure ([place])   . Protein calorie malnutrition ([place]) 07/12/2015  . Congestive dilated cardiomyopathy ([place]) 07/11/2015  . Pressure ulcer 07/09/2015  . Open knee wound 07/06/2015  . History of right below knee amputation ([place]) 02/22/2015  . PAD (peripheral artery disease) ([place]) 02/22/2015  . Blindness of left eye 01/04/2015  . Complications, amputation stump late ([place]) 08/06/2014  . Diabetic neuropathy associated with type 2 diabetes mellitus ([place]) 12/01/2013  . Noncompliance with medications 07/13/2010  . OTHER SPEC TYPES SCHIZOPHRENIA UNSPEC CONDITION 05/24/2009  . Obesity, unspecified 06/09/2007  . DM (diabetes mellitus), type 2, uncontrolled ([place]) 05/02/2006  . Essential hypertension, benign 05/02/2006    Current Outpatient Prescriptions:  .  ACCU-CHEK AVIVA PLUS test strip, CHECK SUGAR 3 TIMES DAILY, Disp: 100 each, Rfl: 3 .  atorvastatin (LIPITOR) 40 MG tablet, Take 1 tablet (40 mg total) by mouth daily., Disp: 90 tablet, Rfl: 1 .  brimonidine (ALPHAGAN) 0.2 % ophthalmic solution, PLACE 1 DROP INTO THE LEFT EYE 2 (TWO) TIMES DAILY., Disp: 5 mL, Rfl: 0 .  carvedilol (COREG) 12.5 MG tablet, Take 1 tablet (12.5 mg total) by mouth 2 (two) times daily with a meal., Disp: 60 tablet, Rfl: 6 .  cholecalciferol (VITAMIN D) 1000 units tablet, Take 1,000 Units by mouth daily., Disp: , Rfl:  .  dorzolamide-timolol (COSOPT) 22.3-6.8 MG/ML ophthalmic solution, Place 1 drop into the left eye 2 (two) times daily., Disp: 10 mL, Rfl: 12 .  furosemide (LASIX) 80 MG tablet, Take 80 mg (1 tabs) in am and 40 mg (1/2 tab) in pm, Disp: 45 tablet, Rfl: 6 .  glucose  blood (ACCU-CHEK ACTIVE STRIPS) test strip, Use as instructed, Disp: 100 each, Rfl: 12 .  insulin glargine (LANTUS) 100 UNIT/ML injection, Inject 15 Units into the skin at bedtime., Disp: , Rfl:  .  metFORMIN (GLUCOPHAGE) 500 MG tablet, Take 1 tablet (500 mg total) by mouth 2 (two) times daily with a meal., Disp: 60 tablet, Rfl: 6 .  potassium chloride SA (K-DUR,KLOR-CON) 20 MEQ tablet, Take 20 mEq by mouth 2 (two) times daily., Disp: , Rfl:  .  sacubitril-valsartan (ENTRESTO) 24-26 MG, Take 1 tablet by mouth 2 (two) times daily., Disp: 60 tablet, Rfl: 11 .  vitamin A 10000 UNIT capsule, Take 10,000 Units by mouth daily., Disp: , Rfl:  No Known Allergies   Social History   Social History  . Marital status: Married    Spouse name: N/A  . Number of children: N/A  . Years of education: N/A   Occupational History  . Not on file.   Social History Main Topics  . Smoking status: Former Smoker    Quit date: 05/10/2000  . Smokeless tobacco: Former Systems developer    Quit date: 02/22/2000  . Alcohol use No  . Drug use: No  . Sexual activity: Not on file   Other Topics Concern  . Not on file   Social History Narrative   ** Merged History Encounter **       Lives alone.     Physical Exam  Pulmonary/Chest: No respiratory distress.  Abdominal: He exhibits no distension. There  is no tenderness. There is no guarding.  Musculoskeletal: He exhibits edema.  Skin: Skin is warm and dry. He is not diaphoretic.        Future Appointments Date Time Provider [place]  08/07/2016 11:00 AM [facility] PA/NP [facility] None    ATF pt [name] x4 with no complaints today.  Pt moved into his new apartment yesterday with is getting settled.  The home aide from [facility] is still coming over to cook and help clean for him.  he denies sob, dizziness, headache and chest pain.  He has not taken any medications since my last visit.  Pt has edema in both lower extremities.  He has a skin graft scheduled for next  Friday 6/8 and he will be in rehab for about 3 weeks. I will f/u with in on the 12th to find out further.  The shower seat that he has is unsteady and the wheelchair is still not working correctly.  cbg 118  BP (!) 142/100 (BP Location: Right Arm, Patient Position: Sitting, Cuff Size: Normal)   Pulse 82   Resp 16   Wt 224 lb (101.6 kg)   SpO2 96%   BMI 30.38 kg/m   Weight yesterday-didn't weigh Last visit weight-224    Demetria Copper, EMT Paramedic 08/03/2016    ACTION: Home visit completed

## 2016-08-07 ENCOUNTER — Other Ambulatory Visit (INDEPENDENT_AMBULATORY_CARE_PROVIDER_SITE_OTHER): Payer: Self-pay | Admitting: Orthopedic Surgery

## 2016-08-07 ENCOUNTER — Inpatient Hospital Stay (HOSPITAL_COMMUNITY): Admission: RE | Admit: 2016-08-07 | Payer: Medicaid Other | Source: Ambulatory Visit

## 2016-08-07 ENCOUNTER — Telehealth: Payer: Self-pay | Admitting: Licensed Clinical Social Worker

## 2016-08-07 NOTE — Telephone Encounter (Signed)
CSW received call from [facility] stating that patient is refusing to come to today's clinic appointment and she is concerned about his increased weight and upcoming surgery on Friday for a skin graft. Patient's sister attempted to convince him as well with no success. CSW attempted to contact patient by phone with no answer. Patient did cancel appointment for today. CSW will continue to coordinate care with Paramedicine Program and attempt to reschedule appointment if patient agrees. [name], [place], [place]

## 2016-08-09 ENCOUNTER — Encounter (HOSPITAL_COMMUNITY): Payer: Self-pay | Admitting: *Deleted

## 2016-08-09 MED ORDER — CEFAZOLIN SODIUM-DEXTROSE 2-4 GM/100ML-% IV SOLN
2.0000 g | INTRAVENOUS | Status: AC
  Administered 2016-08-10: 2 g via INTRAVENOUS
  Filled 2016-08-09: qty 100

## 2016-08-09 NOTE — Progress Notes (Addendum)
Mr Hilleary denies chest p[ain or shortness of breath.   Patient reports that CBGs run < 200 , never less than 100. I instructed patient to check CBG to check CBG and if it is less than 70 1/2 cup of clear juice like apple juice or cranberry juice I instructed patient to recheck CBG in 15 minutes and if CBG is not greater than 70, to  Call [phone] (pre- op). If it is before pre-op opens to retreat as before and recheck CBG in 15 minutes. I told patient to make note of time that liquid is taken and amount, that surgical time may have to be adjusted.   Dr Jenita Seashore informed of patient's history of Pul HTN, CHF- EF 15%, severe nonischemic cardiomyopathy from cath in 2017.

## 2016-08-10 ENCOUNTER — Emergency Department (HOSPITAL_COMMUNITY)
Admission: EM | Admit: 2016-08-10 | Discharge: 2016-08-10 | Disposition: A | Discharge status: Home / Self Care | Payer: Medicaid Other | Attending: Emergency Medicine | Admitting: Emergency Medicine

## 2016-08-10 ENCOUNTER — Inpatient Hospital Stay (HOSPITAL_COMMUNITY): Payer: Medicaid Other | Admitting: Anesthesiology

## 2016-08-10 ENCOUNTER — Encounter (HOSPITAL_COMMUNITY): Payer: Self-pay

## 2016-08-10 ENCOUNTER — Encounter (HOSPITAL_COMMUNITY)
Admission: RE | Disposition: A | Discharge status: Home / Self Care | Payer: Self-pay | Source: Ambulatory Visit | Attending: Orthopedic Surgery

## 2016-08-10 ENCOUNTER — Ambulatory Visit (HOSPITAL_COMMUNITY)
Admission: RE | Admit: 2016-08-10 | Discharge: 2016-08-10 | Disposition: A | Discharge status: Home / Self Care | Payer: Medicaid Other | Source: Ambulatory Visit | Attending: Orthopedic Surgery | Admitting: Orthopedic Surgery

## 2016-08-10 ENCOUNTER — Encounter (HOSPITAL_COMMUNITY): Payer: Self-pay | Admitting: Emergency Medicine

## 2016-08-10 DIAGNOSIS — H5440 Blindness, one eye, unspecified eye: Secondary | ICD-10-CM | POA: Diagnosis not present

## 2016-08-10 DIAGNOSIS — I739 Peripheral vascular disease, unspecified: Secondary | ICD-10-CM | POA: Insufficient documentation

## 2016-08-10 DIAGNOSIS — N183 Chronic kidney disease, stage 3 (moderate): Secondary | ICD-10-CM | POA: Insufficient documentation

## 2016-08-10 DIAGNOSIS — Z79899 Other long term (current) drug therapy: Secondary | ICD-10-CM | POA: Insufficient documentation

## 2016-08-10 DIAGNOSIS — Z8249 Family history of ischemic heart disease and other diseases of the circulatory system: Secondary | ICD-10-CM | POA: Insufficient documentation

## 2016-08-10 DIAGNOSIS — Z6830 Body mass index (BMI) 30.0-30.9, adult: Secondary | ICD-10-CM | POA: Diagnosis not present

## 2016-08-10 DIAGNOSIS — R0600 Dyspnea, unspecified: Secondary | ICD-10-CM | POA: Diagnosis not present

## 2016-08-10 DIAGNOSIS — E46 Unspecified protein-calorie malnutrition: Secondary | ICD-10-CM | POA: Insufficient documentation

## 2016-08-10 DIAGNOSIS — Z955 Presence of coronary angioplasty implant and graft: Secondary | ICD-10-CM | POA: Diagnosis not present

## 2016-08-10 DIAGNOSIS — I5042 Chronic combined systolic (congestive) and diastolic (congestive) heart failure: Secondary | ICD-10-CM | POA: Diagnosis not present

## 2016-08-10 DIAGNOSIS — Z4801 Encounter for change or removal of surgical wound dressing: Secondary | ICD-10-CM | POA: Diagnosis not present

## 2016-08-10 DIAGNOSIS — I429 Cardiomyopathy, unspecified: Secondary | ICD-10-CM | POA: Insufficient documentation

## 2016-08-10 DIAGNOSIS — I5043 Acute on chronic combined systolic (congestive) and diastolic (congestive) heart failure: Secondary | ICD-10-CM | POA: Diagnosis not present

## 2016-08-10 DIAGNOSIS — Z87891 Personal history of nicotine dependence: Secondary | ICD-10-CM | POA: Insufficient documentation

## 2016-08-10 DIAGNOSIS — E1140 Type 2 diabetes mellitus with diabetic neuropathy, unspecified: Secondary | ICD-10-CM | POA: Insufficient documentation

## 2016-08-10 DIAGNOSIS — E1122 Type 2 diabetes mellitus with diabetic chronic kidney disease: Secondary | ICD-10-CM | POA: Diagnosis not present

## 2016-08-10 DIAGNOSIS — T879 Unspecified complications of amputation stump: Secondary | ICD-10-CM

## 2016-08-10 DIAGNOSIS — I130 Hypertensive heart and chronic kidney disease with heart failure and stage 1 through stage 4 chronic kidney disease, or unspecified chronic kidney disease: Secondary | ICD-10-CM | POA: Insufficient documentation

## 2016-08-10 DIAGNOSIS — X58XXXA Exposure to other specified factors, initial encounter: Secondary | ICD-10-CM | POA: Diagnosis not present

## 2016-08-10 DIAGNOSIS — L97811 Non-pressure chronic ulcer of other part of right lower leg limited to breakdown of skin: Secondary | ICD-10-CM | POA: Diagnosis present

## 2016-08-10 DIAGNOSIS — Z794 Long term (current) use of insulin: Secondary | ICD-10-CM | POA: Diagnosis not present

## 2016-08-10 DIAGNOSIS — T8789 Other complications of amputation stump: Secondary | ICD-10-CM | POA: Diagnosis present

## 2016-08-10 DIAGNOSIS — Z4889 Encounter for other specified surgical aftercare: Secondary | ICD-10-CM

## 2016-08-10 DIAGNOSIS — I110 Hypertensive heart disease with heart failure: Secondary | ICD-10-CM | POA: Insufficient documentation

## 2016-08-10 DIAGNOSIS — Z9114 Patient's other noncompliance with medication regimen: Secondary | ICD-10-CM | POA: Diagnosis not present

## 2016-08-10 DIAGNOSIS — M9689 Other intraoperative and postprocedural complications and disorders of the musculoskeletal system: Secondary | ICD-10-CM | POA: Diagnosis not present

## 2016-08-10 DIAGNOSIS — Z809 Family history of malignant neoplasm, unspecified: Secondary | ICD-10-CM | POA: Diagnosis not present

## 2016-08-10 HISTORY — DX: Polyneuropathy, unspecified: G62.9

## 2016-08-10 HISTORY — PX: SKIN SPLIT GRAFT: SHX444

## 2016-08-10 HISTORY — PX: APPLICATION OF WOUND VAC: SHX5189

## 2016-08-10 HISTORY — DX: Heart failure, unspecified: I50.9

## 2016-08-10 LAB — BASIC METABOLIC PANEL
Anion gap: 9 (ref 5–15)
BUN: 23 mg/dL — ABNORMAL HIGH (ref 6–20)
CO2: 25 mmol/L (ref 22–32)
Calcium: 8.4 mg/dL — ABNORMAL LOW (ref 8.9–10.3)
Chloride: 101 mmol/L (ref 101–111)
Creatinine, Ser: 1.27 mg/dL — ABNORMAL HIGH (ref 0.61–1.24)
GFR calc Af Amer: >60 mL/min (ref >60)
GFR calc non Af Amer: >60 mL/min (ref >60)
Glucose, Bld: 94 mg/dL (ref 65–99)
Potassium: 4.8 mmol/L (ref 3.5–5.1)
Sodium: 135 mmol/L (ref 135–145)

## 2016-08-10 LAB — GLUCOSE, CAPILLARY
Glucose-Capillary: 92 mg/dL (ref 65–99)
Glucose-Capillary: 89 mg/dL (ref 65–99)
Glucose-Capillary: 95 mg/dL (ref 65–99)

## 2016-08-10 LAB — CBC
HCT: 43.7 % (ref 39.0–52.0)
Hemoglobin: 13.9 g/dL (ref 13.0–17.0)
MCH: 25.3 pg — ABNORMAL LOW (ref 26.0–34.0)
MCHC: 31.8 g/dL (ref 30.0–36.0)
MCV: 79.6 fL (ref 78.0–100.0)
Platelets: 294 10*3/uL (ref 150–400)
RBC: 5.49 MIL/uL (ref 4.22–5.81)
RDW: 17.1 % — ABNORMAL HIGH (ref 11.5–15.5)
WBC: 6.6 10*3/uL (ref 4.0–10.5)

## 2016-08-10 SURGERY — APPLICATION, GRAFT, SKIN, SPLIT-THICKNESS
Anesthesia: General | Site: Leg Lower | Laterality: Right

## 2016-08-10 MED ORDER — LIDOCAINE 2% (20 MG/ML) 5 ML SYRINGE
INTRAMUSCULAR | Status: AC
  Filled 2016-08-10: qty 5

## 2016-08-10 MED ORDER — ONDANSETRON HCL 4 MG/2ML IJ SOLN
INTRAMUSCULAR | Status: AC
  Filled 2016-08-10: qty 2

## 2016-08-10 MED ORDER — 0.9 % SODIUM CHLORIDE (POUR BTL) OPTIME
TOPICAL | Status: DC | PRN
  Administered 2016-08-10: 1000 mL

## 2016-08-10 MED ORDER — SUCCINYLCHOLINE CHLORIDE 20 MG/ML IJ SOLN
INTRAMUSCULAR | Status: DC | PRN
  Administered 2016-08-10: 120 mg via INTRAVENOUS

## 2016-08-10 MED ORDER — CARVEDILOL 12.5 MG PO TABS
12.5000 mg | ORAL_TABLET | Freq: Once | ORAL | Status: AC
  Administered 2016-08-10: 12.5 mg via ORAL

## 2016-08-10 MED ORDER — MIDAZOLAM HCL 5 MG/5ML IJ SOLN
INTRAMUSCULAR | Status: DC | PRN
  Administered 2016-08-10 (×2): 1 mg via INTRAVENOUS

## 2016-08-10 MED ORDER — OXYCODONE HCL 5 MG PO TABS: 5.0000 mg | ORAL_TABLET | Freq: Once | ORAL | Status: DC | PRN

## 2016-08-10 MED ORDER — DEXAMETHASONE SODIUM PHOSPHATE 10 MG/ML IJ SOLN
INTRAMUSCULAR | Status: AC
  Filled 2016-08-10: qty 1

## 2016-08-10 MED ORDER — LIDOCAINE HCL (CARDIAC) 20 MG/ML IV SOLN
INTRAVENOUS | Status: DC | PRN
  Administered 2016-08-10: 100 mg via INTRAVENOUS

## 2016-08-10 MED ORDER — ETOMIDATE 2 MG/ML IV SOLN
INTRAVENOUS | Status: AC
  Filled 2016-08-10: qty 10

## 2016-08-10 MED ORDER — LACTATED RINGERS IV SOLN
INTRAVENOUS | Status: DC
  Administered 2016-08-10: 10:00:00 via INTRAVENOUS

## 2016-08-10 MED ORDER — HYDROMORPHONE HCL 1 MG/ML IJ SOLN: 0.2500 mg | INTRAMUSCULAR | Status: DC | PRN

## 2016-08-10 MED ORDER — PROPOFOL 10 MG/ML IV BOLUS
INTRAVENOUS | Status: AC
  Filled 2016-08-10: qty 20

## 2016-08-10 MED ORDER — FENTANYL CITRATE (PF) 250 MCG/5ML IJ SOLN
INTRAMUSCULAR | Status: AC
  Filled 2016-08-10: qty 5

## 2016-08-10 MED ORDER — CHLORHEXIDINE GLUCONATE 4 % EX LIQD: 60.0000 mL | Freq: Once | CUTANEOUS | Status: DC

## 2016-08-10 MED ORDER — MIDAZOLAM HCL 2 MG/2ML IJ SOLN
INTRAMUSCULAR | Status: AC
  Filled 2016-08-10: qty 2

## 2016-08-10 MED ORDER — CARVEDILOL 12.5 MG PO TABS
ORAL_TABLET | ORAL | Status: AC
  Administered 2016-08-10: 12.5 mg via ORAL
  Filled 2016-08-10: qty 1

## 2016-08-10 MED ORDER — PROPOFOL 10 MG/ML IV BOLUS
INTRAVENOUS | Status: DC | PRN
  Administered 2016-08-10 (×3): 50 mg via INTRAVENOUS

## 2016-08-10 MED ORDER — ONDANSETRON HCL 4 MG/2ML IJ SOLN
INTRAMUSCULAR | Status: DC | PRN
  Administered 2016-08-10: 4 mg via INTRAVENOUS

## 2016-08-10 MED ORDER — FENTANYL CITRATE (PF) 100 MCG/2ML IJ SOLN
INTRAMUSCULAR | Status: DC | PRN
  Administered 2016-08-10: 50 ug via INTRAVENOUS

## 2016-08-10 MED ORDER — DEXAMETHASONE SODIUM PHOSPHATE 4 MG/ML IJ SOLN
INTRAMUSCULAR | Status: DC | PRN
  Administered 2016-08-10: 4 mg via INTRAVENOUS

## 2016-08-10 MED ORDER — PROMETHAZINE HCL 25 MG/ML IJ SOLN: 6.2500 mg | INTRAMUSCULAR | Status: DC | PRN

## 2016-08-10 MED ORDER — OXYCODONE HCL 5 MG/5ML PO SOLN: 5.0000 mg | Freq: Once | ORAL | Status: DC | PRN

## 2016-08-10 SURGICAL SUPPLY — 43 items
BNDG CMPR 9X4 STRL LF SNTH (GAUZE/BANDAGES/DRESSINGS)
BNDG COHESIVE 6X5 TAN STRL LF (GAUZE/BANDAGES/DRESSINGS) ×1 IMPLANT
BNDG ESMARK 4X9 LF (GAUZE/BANDAGES/DRESSINGS) ×2 IMPLANT
BNDG GAUZE STRTCH 6 (GAUZE/BANDAGES/DRESSINGS) IMPLANT
COVER SURGICAL LIGHT HANDLE (MISCELLANEOUS) ×6 IMPLANT
CUFF TOURNIQUET SINGLE 18IN (TOURNIQUET CUFF) IMPLANT
CUFF TOURNIQUET SINGLE 24IN (TOURNIQUET CUFF) IMPLANT
DERMACARRIERS GRAFT 1 TO 1.5 (DISPOSABLE)
DRAPE U-SHAPE 47X51 STRL (DRAPES) ×3 IMPLANT
DRSG ADAPTIC 3X8 NADH LF (GAUZE/BANDAGES/DRESSINGS) IMPLANT
DRSG MEPITEL 4X7.2 (GAUZE/BANDAGES/DRESSINGS) ×3 IMPLANT
DRSG PAD ABDOMINAL 8X10 ST (GAUZE/BANDAGES/DRESSINGS) ×1 IMPLANT
DURAPREP 26ML APPLICATOR (WOUND CARE) ×3 IMPLANT
ELECT REM PT RETURN 9FT ADLT (ELECTROSURGICAL) ×3
ELECTRODE REM PT RTRN 9FT ADLT (ELECTROSURGICAL) ×2 IMPLANT
GAUZE SPONGE 4X4 12PLY STRL (GAUZE/BANDAGES/DRESSINGS) IMPLANT
GLOVE BIOGEL PI IND STRL 9 (GLOVE) ×2 IMPLANT
GLOVE BIOGEL PI INDICATOR 9 (GLOVE) ×1
GLOVE SURG ORTHO 9.0 STRL STRW (GLOVE) ×3 IMPLANT
GOWN STRL REUS W/ TWL XL LVL3 (GOWN DISPOSABLE) ×4 IMPLANT
GOWN STRL REUS W/TWL XL LVL3 (GOWN DISPOSABLE) ×6
GRAFT DERMACARRIERS 1 TO 1.5 (DISPOSABLE) IMPLANT
GRAFT TISS THERASKIN 2X3 (Tissue) IMPLANT
KIT BASIN OR (CUSTOM PROCEDURE TRAY) ×3 IMPLANT
KIT ROOM TURNOVER OR (KITS) ×3 IMPLANT
MANIFOLD NEPTUNE II (INSTRUMENTS) ×3 IMPLANT
NDL HYPO 25GX1X1/2 BEV (NEEDLE) IMPLANT
NEEDLE HYPO 25GX1X1/2 BEV (NEEDLE) IMPLANT
NS IRRIG 1000ML POUR BTL (IV SOLUTION) ×3 IMPLANT
PACK ORTHO EXTREMITY (CUSTOM PROCEDURE TRAY) ×3 IMPLANT
PAD ARMBOARD 7.5X6 YLW CONV (MISCELLANEOUS) ×6 IMPLANT
PAD CAST 4YDX4 CTTN HI CHSV (CAST SUPPLIES) IMPLANT
PADDING CAST COTTON 4X4 STRL (CAST SUPPLIES)
PREVENA INCISION MGT 90 150 (MISCELLANEOUS) ×1 IMPLANT
SUCTION FRAZIER HANDLE 10FR (MISCELLANEOUS)
SUCTION TUBE FRAZIER 10FR DISP (MISCELLANEOUS) IMPLANT
SUT ETHILON 4 0 PS 2 18 (SUTURE) IMPLANT
SYR CONTROL 10ML LL (SYRINGE) IMPLANT
TISSUE THERASKIN 2X3 (Tissue) ×3 IMPLANT
TOWEL OR 17X24 6PK STRL BLUE (TOWEL DISPOSABLE) ×3 IMPLANT
TOWEL OR 17X26 10 PK STRL BLUE (TOWEL DISPOSABLE) ×3 IMPLANT
TUBE CONNECTING 12X1/4 (SUCTIONS) IMPLANT
WATER STERILE IRR 1000ML POUR (IV SOLUTION) ×2 IMPLANT

## 2016-08-10 NOTE — Care Management (Signed)
ED CM  contacted patient's SO [name] explaining that patient is medically clear for discharge. She states she is currently not at home and cannot come to pick him up. CM asked if someone else can pick him up, she states no.  Offered to get him home by [facility]. She is agreeable, and provided the building code [id number] Rm [id number] if she is not there. CM also discussed HHRN SO and patient agreed offered  choice [facility]. CM faxed referral to [facility]  via [facility] CM updated K Humes PA-C and Nurse, adult on [employer]. Patient being transported by [facility].

## 2016-08-10 NOTE — ED Notes (Signed)
Wound vac reapplied and restarted.

## 2016-08-10 NOTE — ED Notes (Signed)
Pt discharged with PTAR, verbalized understanding that home health will follow-up.

## 2016-08-10 NOTE — ED Triage Notes (Signed)
Per EMS, pt had wound surgery today with placement of wound vac. While pt was using the bathroom, wound vac fell out. VSS

## 2016-08-10 NOTE — Op Note (Signed)
08/10/2016  1:41 PM  PATIENT:  [name]    PRE-OPERATIVE DIAGNOSIS:  Ulcer Right Below Knee Amputation  POST-OPERATIVE DIAGNOSIS:  Same  PROCEDURE:  SKIN GRAFT SPLIT THICKNESS RIGHT LEG, APPLICATION OF [place]  WOUND VAC  SURGEON:  Newt Minion, MD  PHYSICIAN ASSISTANT:None ANESTHESIA:   General  PREOPERATIVE INDICATIONS:  [name] is a  61 y.o. male with a diagnosis of rUlcer Right Below Knee Amputation who failed conservative measures and elected for surgical management.    The risks benefits and alternatives were discussed with the patient preoperatively including but not limited to the risks of infection, bleeding, nerve injury, cardiopulmonary complications, the need for revision surgery, among others, and the patient was willing to proceed.  OPERATIVE IMPLANTS: Prevena wound VAC. TheraSkin 2 x 3" graft.  OPERATIVE FINDINGS: No abscess good granulation tissue after debridement.  OPERATIVE PROCEDURE: Patient was brought to the operating room and underwent a general anesthetic. After adequate levels of anesthesia were obtained patient's right lower extremity was prepped using DuraPrep draped into a sterile field a timeout was called. The nonviable tissue around the wound was excised with the wound tissue. This left a wound 6 cm in diameter and 3 mm deep. Electrocautery was used for hemostasis. The wound was irrigated with normal saline. There was no abscess no exposed bone or tendon. A split-thickness allograft skin graft was then applied to the wound. This was stapled in place. A Prevena wound VAC was applied this had a good suction fit patient was extubated taken to the PACU in stable condition.

## 2016-08-10 NOTE — H&P (Signed)
[name] is an 61 y.o. male.   Chief Complaint: Ulcer right tibial tubercle status post right transtibial amputation HPI: Patient is a 61 year old gentleman who is status post transtibial amputation. Due to progressive activities and inability to maintain nonweightbearing off the developing ulcer patient has developed a large ulcer over the tibial tubercle and has failed conservative wound care and presents at this time for skin graft.  Past Medical History:  Diagnosis Date  . Acute on chronic systolic heart failure, NYHA class 3 ([place])   . AKI (acute kidney injury) ([place])   . Anemia   . BKA stump complication ([place])   . Blind left eye   . Blind left eye   . Cardiomyopathy, dilated ([place])   . CHF (congestive heart failure) ([place])   . Combined systolic and diastolic congestive heart failure ([place])   . Congestive dilated cardiomyopathy ([place]) 07/11/2015  . Diabetes mellitus   . Diabetic neuropathy associated with type 2 diabetes mellitus ([place])   . Hypertension   . Neuropathy   . Noncompliance with medications   . Open knee wound 10/2015   on rt bka  . PAD (peripheral artery disease) ([place])   . Protein calorie malnutrition ([place])   . Shortness of breath dyspnea     Past Surgical History:  Procedure Laterality Date  . AMPUTATION Right 09/26/2013   Procedure: AMPUTATION BELOW KNEE;  Surgeon: Rozanna Box, MD;  Location: [facility];  Service: Orthopedics;  Laterality: Right;  . AMPUTATION Right 05/01/2015   Procedure: REVISION OF RIGHT TRANSTIBIAL AMPUTATION ;  Surgeon: Newt Minion, MD;  Location: [facility];  Service: Orthopedics;  Laterality: Right;  . CARDIAC CATHETERIZATION N/A 07/13/2015   Procedure: Right/Left Heart Cath and Coronary Angiography;  Surgeon: Jolaine Artist, MD;  Location: [facility];  Service: Cardiovascular;  Laterality: N/A;  . COLONOSCOPY    . I&D EXTREMITY Right 09/24/2013   Procedure: IRRIGATION AND DEBRIDEMENT RIGHT FOOT ULCER;  Surgeon: Renette Butters, MD;   Location: [facility];  Service: Orthopedics;  Laterality: Right;  . I&D EXTREMITY Right 09/26/2013   Procedure: Repeat IRRIGATION AND DEBRIDEMENT Right Foot Ulcer;  Surgeon: Rozanna Box, MD;  Location: [facility];  Service: Orthopedics;  Laterality: Right;  . MULTIPLE TOOTH EXTRACTIONS    . STUMP REVISION Right 04/20/2015   Procedure: Revision Right Below Knee Amputation, Apply Wound VAC;  Surgeon: Newt Minion, MD;  Location: [facility];  Service: Orthopedics;  Laterality: Right;  . TONSILLECTOMY      Family History  Problem Relation Age of Onset  . Cancer Mother   . Peripheral vascular disease Father    Social History:  reports that he quit smoking about 16 years ago. He quit after 20.00 years of use. He quit smokeless tobacco use about 16 years ago. He reports that he does not drink alcohol or use drugs.  Allergies:  Allergies  Allergen Reactions  . No Known Allergies     No prescriptions prior to admission.    No results found for this or any previous visit (from the past 48 hour(s)). No results found.  Review of Systems  All other systems reviewed and are negative.   There were no vitals taken for this visit. Physical Exam  On examination the residual limb has good healthy tissue no ulcers over the end of the residual limb. Patient has a ulcer over the tibial tubercle which is approximately 4 cm in diameter. This is about 1 mm deep  with healthy viable granulation tissue. Assessment/Plan Assessment: Right transtibial amputation with ulcer of the tibial tubercle.  Plan: We'll plan for split-thickness skin graft and application of a Prevena wound VAC. Risks and benefits were discussed including nonhealing of the skin graft need for additional surgery. Patient states he understands wishes to proceed at this time.  Newt Minion, MD 08/10/2016, 7:24 AM

## 2016-08-10 NOTE — Transfer of Care (Signed)
Immediate Anesthesia Transfer of Care Note  Patient: [name]  Procedure(s) Performed: Procedure(s): SKIN GRAFT SPLIT THICKNESS RIGHT LEG (Right) APPLICATION OFI NCISONAL  WOUND VAC  Patient Location: PACU  Anesthesia Type:General  Level of Consciousness: awake, oriented and patient cooperative  Airway & Oxygen Therapy: Patient Spontanous Breathing and Patient connected to face mask oxygen  Post-op Assessment: Report given to RN and Post -op Vital signs reviewed and stable  Post vital signs: Reviewed  Last Vitals:  Vitals:   08/10/16 1030 08/10/16 1032  BP: (!) 155/120 (!) 149/106  Pulse:    Resp:      Last Pain: There were no vitals filed for this visit.       Complications: No apparent anesthesia complications

## 2016-08-10 NOTE — ED Provider Notes (Signed)
[name] DEPT Provider Note   CSN: [id number] Arrival date & time: 08/10/16  1913    History   Chief Complaint Chief Complaint  Patient presents with  . Post-op Problem    HPI [name] is a 61 y.o. male.  61 year old male with history of congestive dilated CM, CHF, PAD, and DM presents to the ED for postoperative complication. He is 0 days s/p application of incisional wound vac for management of ulcer to R BKA stump which as failed conservative therapies. Patient states that he was using the bathroom this evening when his wound vac fell out. He denies any pain to the area. He is usually mobile with the assistance of a walker. No fevers post procedure.   The history is provided by the patient. No language interpreter was used.    Past Medical History:  Diagnosis Date  . Acute on chronic systolic heart failure, NYHA class 3 ([place])   . AKI (acute kidney injury) ([place])   . Anemia   . BKA stump complication ([place])   . Blind left eye   . Blind left eye   . Cardiomyopathy, dilated ([place])   . CHF (congestive heart failure) ([place])   . Combined systolic and diastolic congestive heart failure ([place])   . Congestive dilated cardiomyopathy ([place]) 07/11/2015  . Diabetes mellitus   . Diabetic neuropathy associated with type 2 diabetes mellitus ([place])   . Hypertension   . Neuropathy   . Noncompliance with medications   . Open knee wound 10/2015   on rt bka  . PAD (peripheral artery disease) ([place])   . Protein calorie malnutrition ([place])   . Shortness of breath dyspnea     Patient Active Problem List   Diagnosis Date Noted  . Chronic kidney disease (CKD), stage III (moderate) 06/07/2016  . Chronic combined systolic and diastolic congestive heart failure ([place])   . Protein calorie malnutrition ([place]) 07/12/2015  . Congestive dilated cardiomyopathy ([place]) 07/11/2015  . Pressure ulcer 07/09/2015  . Open knee wound 07/06/2015  . History of right below knee amputation ([place]) 02/22/2015    . PAD (peripheral artery disease) ([place]) 02/22/2015  . Blindness of left eye 01/04/2015  . Complications, amputation stump late ([place]) 08/06/2014  . Diabetic neuropathy associated with type 2 diabetes mellitus ([place]) 12/01/2013  . Noncompliance with medications 07/13/2010  . OTHER SPEC TYPES SCHIZOPHRENIA UNSPEC CONDITION 05/24/2009  . Obesity, unspecified 06/09/2007  . DM (diabetes mellitus), type 2, uncontrolled ([place]) 05/02/2006  . Essential hypertension, benign 05/02/2006    Past Surgical History:  Procedure Laterality Date  . AMPUTATION Right 09/26/2013   Procedure: AMPUTATION BELOW KNEE;  Surgeon: Rozanna Box, MD;  Location: [facility];  Service: Orthopedics;  Laterality: Right;  . AMPUTATION Right 05/01/2015   Procedure: REVISION OF RIGHT TRANSTIBIAL AMPUTATION ;  Surgeon: Newt Minion, MD;  Location: [facility];  Service: Orthopedics;  Laterality: Right;  . CARDIAC CATHETERIZATION N/A 07/13/2015   Procedure: Right/Left Heart Cath and Coronary Angiography;  Surgeon: Jolaine Artist, MD;  Location: [facility];  Service: Cardiovascular;  Laterality: N/A;  . COLONOSCOPY    . I&D EXTREMITY Right 09/24/2013   Procedure: IRRIGATION AND DEBRIDEMENT RIGHT FOOT ULCER;  Surgeon: Renette Butters, MD;  Location: [facility];  Service: Orthopedics;  Laterality: Right;  . I&D EXTREMITY Right 09/26/2013   Procedure: Repeat IRRIGATION AND DEBRIDEMENT Right Foot Ulcer;  Surgeon: Rozanna Box, MD;  Location: [facility];  Service: Orthopedics;  Laterality: Right;  .  MULTIPLE TOOTH EXTRACTIONS    . STUMP REVISION Right 04/20/2015   Procedure: Revision Right Below Knee Amputation, Apply Wound VAC;  Surgeon: Newt Minion, MD;  Location: [facility];  Service: Orthopedics;  Laterality: Right;  . TONSILLECTOMY         Home Medications    Prior to Admission medications   Medication Sig Start Date End Date Taking? Authorizing Provider  atorvastatin (LIPITOR) 40 MG tablet Take 1 tablet (40 mg total) by mouth  daily. 01/24/16   Clegg, Amy D, NP  brimonidine (ALPHAGAN) 0.2 % ophthalmic solution PLACE 1 DROP INTO THE LEFT EYE 2 (TWO) TIMES DAILY. 06/15/15   Mariel Aloe, MD  carvedilol (COREG) 12.5 MG tablet Take 1 tablet (12.5 mg total) by mouth 2 (two) times daily with a meal. 02/07/16   Clegg, Amy D, NP  cholecalciferol (VITAMIN D) 1000 units tablet Take 1,000 Units by mouth daily.    [provider]  dorzolamide-timolol (COSOPT) 22.3-6.8 MG/ML ophthalmic solution Place 1 drop into the left eye 2 (two) times daily. 09/28/13   Virginia Crews, MD  furosemide (LASIX) 80 MG tablet Take 80 mg (1 tabs) in am and 40 mg (1/2 tab) in pm 05/24/16   Shirley Friar, PA-C  insulin glargine (LANTUS) 100 UNIT/ML injection Inject 15 Units into the skin at bedtime.    [provider]  metFORMIN (GLUCOPHAGE) 500 MG tablet Take 1 tablet (500 mg total) by mouth 2 (two) times daily with a meal. 10/10/15   Clegg, Amy D, NP  potassium chloride SA (K-DUR,KLOR-CON) 20 MEQ tablet Take 20 mEq by mouth 2 (two) times daily.    [provider]  sacubitril-valsartan (ENTRESTO) 24-26 MG Take 1 tablet by mouth 2 (two) times daily. 06/07/16   Shirley Friar, PA-C  vitamin A 10000 UNIT capsule Take 10,000 Units by mouth daily.    [provider]    Family History Family History  Problem Relation Age of Onset  . Cancer Mother   . Peripheral vascular disease Father     Social History Social History  Substance Use Topics  . Smoking status: Former Smoker    Years: 20.00    Quit date: 05/10/2000  . Smokeless tobacco: Former Systems developer    Quit date: 02/22/2000  . Alcohol use No     Allergies   No known allergies   Review of Systems Review of Systems Ten systems reviewed and are negative for acute change, except as noted in the HPI.    Physical Exam Updated Vital Signs BP 139/68 (BP Location: Right Arm)   Pulse 73   Temp 98.1 F (36.7 C) (Oral)   Ht 6' (1.829 m)   SpO2  90%   BMI 30.38 kg/m   Physical Exam  Constitutional: He is oriented to person, place, and time. He appears well-developed and well-nourished. No distress.  Nontoxic appearing and in NAD  HENT:  Head: Normocephalic and atraumatic.  Eyes: Conjunctivae and EOM are normal. No scleral icterus.  Neck: Normal range of motion.  Cardiovascular: Normal rate.   Pulmonary/Chest: Effort normal. No respiratory distress.  Respirations even, unlabored.  Musculoskeletal: Normal range of motion.  Right BKA site wrapped as seen below. There is a collection of blood lateral to the wound vac opening. Normal ROM at knee. Patient denies pain. Compartments soft.   Neurological: He is alert and oriented to person, place, and time. He exhibits normal muscle tone. Coordination normal.  Moving all 4 extremities.  Skin:  Skin is warm and dry. No rash noted. He is not diaphoretic. No erythema. No pallor.  Psychiatric: He has a normal mood and affect. His behavior is normal.  Nursing note and vitals reviewed.        ED Treatments / Results  Labs (all labs ordered are listed, but only abnormal results are displayed) Labs Reviewed - No data to display  EKG  EKG Interpretation None       Radiology No results found.  Procedures Procedures (including critical care time)  Medications Ordered in ED Medications - No data to display   Initial Impression / Assessment and Plan / ED Course  I have reviewed the triage vital signs and the nursing notes.  Pertinent labs & imaging results that were available during my care of the patient were reviewed by me and considered in my medical decision making (see chart for details).     9:00 PM I spoke with the patient's friend, Danton Clap to inform her that the patient's wound VAC is now operational. She states that she is at work and unable to transport the patient home. She also states that he is unable to go home because he is unstable there. I inquired about the  long-term plan with the patient's care. Wife states that she is unaware of a care plan and any possibility for home health. I asked the wife why the plan was not already in place in my concerns for home safety were not previously addressed. She is unable to provide a reason for this. Plan to consult social work. Will also touch base with orthopedics, especially since the patient's surgeon, Dr. Sharol Given, is currently on call.  9:25 PM I spoke with Dr. Sharol Given regarding the patient and his care. He states that the skin grafts and wound VAC that were placed are often done as an outpatient procedure. Other than changing the battery for the wound VAC, there is no additional teaching or care required. If his wound VAC continues to fall off, Dr. Sharol Given recommends covering the area with gauze. Dr. Sharol Given states that nothing about the patient's procedure today would have caused him to become unsafe or unstable for continued care at home.   9:30 PM Case discussed with Care Manager, Mariann Laster. Mariann Laster to see patient and to call wife to discuss home health options.  10:15 PM Consult placed for face-to-face encounter as well as home health RN. The patient has been seen by care management. Care manager has also spoken with [facility] regarding the patient's situation. Will continue with discharge home via [facility]. VSS. SpO2 93% on RA.   Final Clinical Impressions(s) / ED Diagnoses   Final diagnoses:  Encounter for postoperative wound check    New Prescriptions New Prescriptions   No medications on file     Antonietta Breach, [name] 08/10/16 2216    Margette Fast, MD 08/11/16 [phone]

## 2016-08-10 NOTE — Anesthesia Preprocedure Evaluation (Addendum)
Anesthesia Evaluation  Patient identified by MRN, date of birth, ID band Patient awake    Reviewed: Allergy & Precautions, NPO status , Patient's Chart, lab work & pertinent test results, reviewed documented beta blocker date and time   History of Anesthesia Complications Negative for: history of anesthetic complications  Airway Mallampati: III  TM Distance: >3 FB Neck ROM: Full    Dental  (+) Edentulous Upper, Teeth Intact, Dental Advisory Given   Pulmonary neg pulmonary ROS, neg shortness of breath, neg sleep apnea, neg COPD, neg recent URI, former smoker,    breath sounds clear to auscultation       Cardiovascular hypertension, Pt. on home beta blockers and Pt. on medications (-) angina+ Peripheral Vascular Disease and +CHF  (-) CAD and (-) Past MI negative cardio ROS   Rhythm:Regular Rate:Normal  Was complaining of chest pain but this went away. No SOB. EKG was unchanged from before   Neuro/Psych PSYCHIATRIC DISORDERS Schizophrenia negative neurological ROS  negative psych ROS   GI/Hepatic negative GI ROS, Neg liver ROS,   Endo/Other  negative endocrine ROSdiabetes, Type 2, Insulin Dependent  Renal/GU Renal InsufficiencyRenal diseasenegative Renal ROS  negative genitourinary   Musculoskeletal negative musculoskeletal ROS (+)   Abdominal   Peds negative pediatric ROS (+)  Hematology negative hematology ROS (+)   Anesthesia Other Findings   Reproductive/Obstetrics negative OB ROS                             Anesthesia Physical  Anesthesia Plan  ASA: III  Anesthesia Plan: General   Post-op Pain Management:    Induction: Intravenous  PONV Risk Score and Plan: 2 and Ondansetron, Dexamethasone and Treatment may vary due to age  Airway Management Planned: Oral ETT  Additional Equipment: Arterial line  Intra-op Plan:   Post-operative Plan: Extubation in OR  Informed Consent:  I have reviewed the patients History and Physical, chart, labs and discussed the procedure including the risks, benefits and alternatives for the proposed anesthesia with the patient or authorized representative who has indicated his/her understanding and acceptance.   Dental advisory given  Plan Discussed with: CRNA and Surgeon  Anesthesia Plan Comments: (Plan treat HTN c hydralazine)       Anesthesia Quick Evaluation

## 2016-08-10 NOTE — Anesthesia Procedure Notes (Signed)
Procedure Name: Intubation Date/Time: 08/10/2016 1:18 PM Performed by: Jenne Campus Pre-anesthesia Checklist: Patient identified, Emergency Drugs available, Suction available and Patient being monitored Patient Re-evaluated:Patient Re-evaluated prior to inductionOxygen Delivery Method: Circle System Utilized Preoxygenation: Pre-oxygenation with 100% oxygen Intubation Type: IV induction Ventilation: Mask ventilation without difficulty Laryngoscope Size: Miller and 3 Grade View: Grade I Tube type: Oral Tube size: 7.5 mm Number of attempts: 1 Airway Equipment and Method: Stylet and Oral airway Placement Confirmation: ETT inserted through vocal cords under direct vision,  positive ETCO2 and breath sounds checked- equal and bilateral Secured at: 21 cm Tube secured with: Tape Dental Injury: Teeth and Oropharynx as per pre-operative assessment

## 2016-08-13 ENCOUNTER — Encounter (HOSPITAL_COMMUNITY): Payer: Self-pay | Admitting: Orthopedic Surgery

## 2016-08-13 NOTE — Anesthesia Postprocedure Evaluation (Signed)
Anesthesia Post Note  Patient: [name]  Procedure(s) Performed: Procedure(s) (LRB): SKIN GRAFT SPLIT THICKNESS RIGHT LEG (Right) APPLICATION OFI [place]     Patient location during evaluation: PACU Anesthesia Type: General Level of consciousness: awake and alert Pain management: pain level controlled Vital Signs Assessment: post-procedure vital signs reviewed and stable Respiratory status: spontaneous breathing, nonlabored ventilation and respiratory function stable Cardiovascular status: blood pressure returned to baseline and stable Postop Assessment: no signs of nausea or vomiting Anesthetic complications: no    Last Vitals:  Vitals:   08/10/16 1420 08/10/16 1435  BP: 122/88 (!) 129/97  Pulse: (!) 51 (!) 54  Resp: 11 (!) 21  Temp:  36.4 C    Last Pain: There were no vitals filed for this visit.               Lynda Rainwater

## 2016-08-15 ENCOUNTER — Ambulatory Visit (INDEPENDENT_AMBULATORY_CARE_PROVIDER_SITE_OTHER): Payer: Medicaid Other | Admitting: Family

## 2016-08-15 ENCOUNTER — Encounter (INDEPENDENT_AMBULATORY_CARE_PROVIDER_SITE_OTHER): Payer: Self-pay | Admitting: Family

## 2016-08-15 VITALS — Ht 72.0 in | Wt 224.0 lb

## 2016-08-15 DIAGNOSIS — E1142 Type 2 diabetes mellitus with diabetic polyneuropathy: Secondary | ICD-10-CM

## 2016-08-15 DIAGNOSIS — T879 Unspecified complications of amputation stump: Secondary | ICD-10-CM

## 2016-08-15 DIAGNOSIS — Z945 Skin transplant status: Secondary | ICD-10-CM

## 2016-08-15 DIAGNOSIS — Z9889 Other specified postprocedural states: Secondary | ICD-10-CM

## 2016-08-15 MED ORDER — SILVER SULFADIAZINE 1 % EX CREA: 1.0000 "application " | TOPICAL_CREAM | Freq: Every day | CUTANEOUS | 2 refills | Status: DC

## 2016-08-15 NOTE — Progress Notes (Signed)
Post-Op Visit Note   Patient: [name]           Date of Birth: [birth year]           MRN: [id number] Visit Date: 08/15/2016 PCP: Steve Rattler, DO  Chief Complaint:  Chief Complaint  Patient presents with  . Right Leg - Follow-up    08/10/16 SKIN GRAFT SPLIT THICKNESS RIGHT LEG - Right APPLICATION OFI NCISONAL WOUND VAC      HPI:  The patient is a 61 year old gentleman who is status post split-thickness skin grafting of an ulcer to his right knee. Prevena vac in place. States has been alarming, no working since Friday.     Ortho Exam Right below knee amputation, skin graft in place, 4 cm in diameter. Bleeding or drainage today. Does have surrounding maceration due to moisture. No erythema or sign of infection.  Visit Diagnoses:  1. H/O skin graft   2. Complications, amputation stump late ([place])   3. Diabetic polyneuropathy associated with type 2 diabetes mellitus ([place])     Plan: follow up in 1 week. Silvadene dressing changes daily following wound cleansing.   Follow-Up Instructions: Return in about 1 week (around 08/22/2016).   Imaging: No results found.  Orders:  No orders of the defined types were placed in this encounter.  No orders of the defined types were placed in this encounter.    PMFS History: Patient Active Problem List   Diagnosis Date Noted  . Chronic kidney disease (CKD), stage III (moderate) 06/07/2016  . Chronic combined systolic and diastolic congestive heart failure ([place])   . Protein calorie malnutrition ([place]) 07/12/2015  . Congestive dilated cardiomyopathy ([place]) 07/11/2015  . Pressure ulcer 07/09/2015  . Open knee wound 07/06/2015  . History of right below knee amputation ([place]) 02/22/2015  . PAD (peripheral artery disease) ([place]) 02/22/2015  . Blindness of left eye 01/04/2015  . Complications, amputation stump late ([place]) 08/06/2014  . Diabetic neuropathy associated with type 2 diabetes mellitus ([place]) 12/01/2013  . Noncompliance  with medications 07/13/2010  . OTHER SPEC TYPES SCHIZOPHRENIA UNSPEC CONDITION 05/24/2009  . Obesity, unspecified 06/09/2007  . DM (diabetes mellitus), type 2, uncontrolled ([place]) 05/02/2006  . Essential hypertension, benign 05/02/2006   Past Medical History:  Diagnosis Date  . Acute on chronic systolic heart failure, NYHA class 3 ([place])   . AKI (acute kidney injury) ([place])   . Anemia   . BKA stump complication ([place])   . Blind left eye   . Blind left eye   . Cardiomyopathy, dilated ([place])   . CHF (congestive heart failure) ([place])   . Combined systolic and diastolic congestive heart failure ([place])   . Congestive dilated cardiomyopathy ([place]) 07/11/2015  . Diabetes mellitus   . Diabetic neuropathy associated with type 2 diabetes mellitus ([place])   . Hypertension   . Neuropathy   . Noncompliance with medications   . Open knee wound 10/2015   on rt bka  . PAD (peripheral artery disease) ([place])   . Protein calorie malnutrition ([place])   . Shortness of breath dyspnea     Family History  Problem Relation Age of Onset  . Cancer Mother   . Peripheral vascular disease Father     Past Surgical History:  Procedure Laterality Date  . AMPUTATION Right 09/26/2013   Procedure: AMPUTATION BELOW KNEE;  Surgeon: Rozanna Box, MD;  Location: [facility];  Service: Orthopedics;  Laterality: Right;  . AMPUTATION Right 05/01/2015   Procedure: REVISION OF  RIGHT TRANSTIBIAL AMPUTATION ;  Surgeon: Newt Minion, MD;  Location: [facility];  Service: Orthopedics;  Laterality: Right;  . APPLICATION OF WOUND VAC  08/10/2016   Procedure: APPLICATION OFI NCISONAL  WOUND VAC;  Surgeon: Newt Minion, MD;  Location: [facility];  Service: Orthopedics;;  . CARDIAC CATHETERIZATION N/A 07/13/2015   Procedure: Right/Left Heart Cath and Coronary Angiography;  Surgeon: Jolaine Artist, MD;  Location: [facility];  Service: Cardiovascular;  Laterality: N/A;  . COLONOSCOPY    . I&D EXTREMITY Right 09/24/2013   Procedure: IRRIGATION  AND DEBRIDEMENT RIGHT FOOT ULCER;  Surgeon: Renette Butters, MD;  Location: [facility];  Service: Orthopedics;  Laterality: Right;  . I&D EXTREMITY Right 09/26/2013   Procedure: Repeat IRRIGATION AND DEBRIDEMENT Right Foot Ulcer;  Surgeon: Rozanna Box, MD;  Location: [facility];  Service: Orthopedics;  Laterality: Right;  . MULTIPLE TOOTH EXTRACTIONS    . SKIN SPLIT GRAFT Right 08/10/2016   Procedure: SKIN GRAFT SPLIT THICKNESS RIGHT LEG;  Surgeon: Newt Minion, MD;  Location: [facility];  Service: Orthopedics;  Laterality: Right;  . STUMP REVISION Right 04/20/2015   Procedure: Revision Right Below Knee Amputation, Apply Wound VAC;  Surgeon: Newt Minion, MD;  Location: [facility];  Service: Orthopedics;  Laterality: Right;  . TONSILLECTOMY     Social History   Occupational History  . Not on file.   Social History Main Topics  . Smoking status: Former Smoker    Years: 20.00    Quit date: 05/10/2000  . Smokeless tobacco: Former Systems developer    Quit date: 02/22/2000  . Alcohol use No  . Drug use: No  . Sexual activity: Not on file

## 2016-08-15 NOTE — Addendum Note (Signed)
Addended by: Maxcine Ham on: 08/15/2016 03:08 PM   Modules accepted: Orders

## 2016-08-16 ENCOUNTER — Other Ambulatory Visit (HOSPITAL_COMMUNITY): Payer: Self-pay

## 2016-08-16 NOTE — Progress Notes (Signed)
Paramedicine Encounter    Patient ID: [name], male    DOB: August 20, 1955, 61 y.o.   MRN: [id number]    Patient Care Team: Steve Rattler, DO as PCP - General  Patient Active Problem List   Diagnosis Date Noted  . Chronic kidney disease (CKD), stage III (moderate) 06/07/2016  . Chronic combined systolic and diastolic congestive heart failure ([place])   . Protein calorie malnutrition ([place]) 07/12/2015  . Congestive dilated cardiomyopathy ([place]) 07/11/2015  . Pressure ulcer 07/09/2015  . Open knee wound 07/06/2015  . History of right below knee amputation ([place]) 02/22/2015  . PAD (peripheral artery disease) ([place]) 02/22/2015  . Blindness of left eye 01/04/2015  . Complications, amputation stump late ([place]) 08/06/2014  . Diabetic neuropathy associated with type 2 diabetes mellitus ([place]) 12/01/2013  . Noncompliance with medications 07/13/2010  . OTHER SPEC TYPES SCHIZOPHRENIA UNSPEC CONDITION 05/24/2009  . Obesity, unspecified 06/09/2007  . DM (diabetes mellitus), type 2, uncontrolled ([place]) 05/02/2006  . Essential hypertension, benign 05/02/2006    Current Outpatient Prescriptions:  .  atorvastatin (LIPITOR) 40 MG tablet, Take 1 tablet (40 mg total) by mouth daily., Disp: 90 tablet, Rfl: 1 .  carvedilol (COREG) 12.5 MG tablet, Take 1 tablet (12.5 mg total) by mouth 2 (two) times daily with a meal., Disp: 60 tablet, Rfl: 6 .  furosemide (LASIX) 80 MG tablet, Take 80 mg (1 tabs) in am and 40 mg (1/2 tab) in pm, Disp: 45 tablet, Rfl: 6 .  metFORMIN (GLUCOPHAGE) 500 MG tablet, Take 1 tablet (500 mg total) by mouth 2 (two) times daily with a meal., Disp: 60 tablet, Rfl: 6 .  potassium chloride SA (K-DUR,KLOR-CON) 20 MEQ tablet, Take 20 mEq by mouth 2 (two) times daily., Disp: , Rfl:  .  sacubitril-valsartan (ENTRESTO) 24-26 MG, Take 1 tablet by mouth 2 (two) times daily., Disp: 60 tablet, Rfl: 11 .  brimonidine (ALPHAGAN) 0.2 % ophthalmic solution, PLACE 1 DROP INTO THE LEFT EYE 2 (TWO)  TIMES DAILY., Disp: 5 mL, Rfl: 0 .  cholecalciferol (VITAMIN D) 1000 units tablet, Take 1,000 Units by mouth daily., Disp: , Rfl:  .  dorzolamide-timolol (COSOPT) 22.3-6.8 MG/ML ophthalmic solution, Place 1 drop into the left eye 2 (two) times daily., Disp: 10 mL, Rfl: 12 .  insulin glargine (LANTUS) 100 UNIT/ML injection, Inject 15 Units into the skin at bedtime., Disp: , Rfl:  .  silver sulfADIAZINE (SILVADENE) 1 % cream, Apply 1 application topically daily., Disp: 50 g, Rfl: 2 .  vitamin A 10000 UNIT capsule, Take 10,000 Units by mouth daily., Disp: , Rfl:  Allergies  Allergen Reactions  . No Known Allergies      Social History   Social History  . Marital status: Married    Spouse name: N/A  . Number of children: N/A  . Years of education: N/A   Occupational History  . Not on file.   Social History Main Topics  . Smoking status: Former Smoker    Years: 20.00    Quit date: 05/10/2000  . Smokeless tobacco: Former Systems developer    Quit date: 02/22/2000  . Alcohol use No  . Drug use: No  . Sexual activity: Not on file   Other Topics Concern  . Not on file   Social History Narrative   ** Merged History Encounter **       Lives alone.     Physical Exam  Pulmonary/Chest: No respiratory distress. He has no wheezes. He has no rales.  Abdominal: He exhibits distension. There is no tenderness. There is no guarding.  Musculoskeletal: He exhibits edema.  Skin: Skin is warm and dry. He is not diaphoretic.        Future Appointments Date Time Provider [place]  08/23/2016 11:00 AM [facility] PA/NP [facility] None  08/27/2016 1:30 PM Suzan Slick, NP [facility] None    ATF pt [name] x4 being assessed by his homecare nurse aimee.  Pt is currently receiving homecare for the wounds on his legs and knees.  His sister reported that pt had been crawling on his knees. Theres blood spots on the floor where he had been crawling. He denies pain. He stated that he is very weak and doesn't feel  well but refuses to allow me to call ems.  Aimee and talked with pt about taking his medications and tried different ways to assist him.  His home aide still comes over to clean but she isn't helping pt with hygiene.  Pt has only taken 3 doses of medication in 2 weeks.  rx bottles verified and pill box refilled.  After today's visit I spoke with Danton Clap his sister about his current condition and she advised that she would like information on POA.  BP (!) 146/100 (BP Location: Left Arm, Patient Position: Sitting, Cuff Size: Normal)   Pulse 80   Resp 16   SpO2 97%  cbg 109  Weight yesterday-cant weigh due to his leg being swollen Last visit weight-220    Demetria Copper, EMT Paramedic 08/16/2016    ACTION: Home visit completed

## 2016-08-22 ENCOUNTER — Telehealth (INDEPENDENT_AMBULATORY_CARE_PROVIDER_SITE_OTHER): Payer: Self-pay | Admitting: Orthopedic Surgery

## 2016-08-22 NOTE — Telephone Encounter (Signed)
Amy Angrave with [facility] called needing verbal orders for WD care for Rt Stump. She advised patient is crawliing around on his stump. Amy said patient has wounds to his left leg and left second toe. She asked that Dr Sharol Given or Junie Panning look at that. The number to contact Amy is [phone]

## 2016-08-22 NOTE — Telephone Encounter (Signed)
I called patient is to be doing silvadene dressing changes daily. Home health to assist. Patients next appointment is on 08/27/16. I called and left vm for Amy stating if she believes patient needs to come in sooner to please call back and let us know so we can make him an appointment sooner.

## 2016-08-23 ENCOUNTER — Ambulatory Visit (HOSPITAL_COMMUNITY)
Admission: RE | Admit: 2016-08-23 | Discharge: 2016-08-23 | Disposition: A | Discharge status: Home / Self Care | Payer: Medicaid Other | Source: Ambulatory Visit | Attending: Internal Medicine | Admitting: Internal Medicine

## 2016-08-23 VITALS — BP 148/108 | HR 77 | Wt 211.0 lb

## 2016-08-23 DIAGNOSIS — N183 Chronic kidney disease, stage 3 unspecified: Secondary | ICD-10-CM

## 2016-08-23 DIAGNOSIS — Z89511 Acquired absence of right leg below knee: Secondary | ICD-10-CM | POA: Diagnosis not present

## 2016-08-23 DIAGNOSIS — Z9119 Patient's noncompliance with other medical treatment and regimen: Secondary | ICD-10-CM | POA: Diagnosis not present

## 2016-08-23 DIAGNOSIS — E1140 Type 2 diabetes mellitus with diabetic neuropathy, unspecified: Secondary | ICD-10-CM | POA: Diagnosis not present

## 2016-08-23 DIAGNOSIS — Z9114 Patient's other noncompliance with medication regimen: Secondary | ICD-10-CM | POA: Insufficient documentation

## 2016-08-23 DIAGNOSIS — E1122 Type 2 diabetes mellitus with diabetic chronic kidney disease: Secondary | ICD-10-CM | POA: Insufficient documentation

## 2016-08-23 DIAGNOSIS — I10 Essential (primary) hypertension: Secondary | ICD-10-CM | POA: Diagnosis not present

## 2016-08-23 DIAGNOSIS — Z79899 Other long term (current) drug therapy: Secondary | ICD-10-CM | POA: Insufficient documentation

## 2016-08-23 DIAGNOSIS — Z794 Long term (current) use of insulin: Secondary | ICD-10-CM | POA: Diagnosis not present

## 2016-08-23 DIAGNOSIS — H5462 Unqualified visual loss, left eye, normal vision right eye: Secondary | ICD-10-CM | POA: Insufficient documentation

## 2016-08-23 DIAGNOSIS — Z809 Family history of malignant neoplasm, unspecified: Secondary | ICD-10-CM | POA: Insufficient documentation

## 2016-08-23 DIAGNOSIS — I420 Dilated cardiomyopathy: Secondary | ICD-10-CM | POA: Insufficient documentation

## 2016-08-23 DIAGNOSIS — Z8249 Family history of ischemic heart disease and other diseases of the circulatory system: Secondary | ICD-10-CM | POA: Insufficient documentation

## 2016-08-23 DIAGNOSIS — I130 Hypertensive heart and chronic kidney disease with heart failure and stage 1 through stage 4 chronic kidney disease, or unspecified chronic kidney disease: Secondary | ICD-10-CM | POA: Insufficient documentation

## 2016-08-23 DIAGNOSIS — I739 Peripheral vascular disease, unspecified: Secondary | ICD-10-CM

## 2016-08-23 DIAGNOSIS — I5042 Chronic combined systolic (congestive) and diastolic (congestive) heart failure: Secondary | ICD-10-CM | POA: Insufficient documentation

## 2016-08-23 DIAGNOSIS — Z87891 Personal history of nicotine dependence: Secondary | ICD-10-CM | POA: Diagnosis not present

## 2016-08-23 MED ORDER — SACUBITRIL-VALSARTAN 49-51 MG PO TABS: 1.0000 | ORAL_TABLET | Freq: Two times a day (BID) | ORAL | 3 refills | Status: DC

## 2016-08-23 NOTE — Progress Notes (Signed)
Advanced Heart Failure Medication Review by a Pharmacist  Does the patient  feel that his/her medications are working for him/her?  yes  Has the patient been experiencing any side effects to the medications prescribed?  no  Does the patient measure his/her own blood pressure or blood glucose at home?  yes   Does the patient have any problems obtaining medications due to transportation or finances?   no  Understanding of regimen: fair Understanding of indications: fair Potential of compliance: poor Patient understands to avoid NSAIDs. Patient understands to avoid decongestants.  Issues to address at subsequent visits: Misses PM doses of meds because they make him feel bad.    Pharmacist comments: [name] is a 61 yo male presenting with medications and pill box. Patient has paramedicine fill pill boxes. He states that he does not take his evening doses because they make him "feel bad". Denies any side effects or issues with meds at this time.    Time with patient: 10 mins Preparation and documentation time: 5 mins Total time: 15 mins

## 2016-08-23 NOTE — Progress Notes (Signed)
Patient ID: [name], male   DOB: 11-01-1955, 61 y.o.   MRN: [id number]   Advanced Heart Failure Clinic Note   PCP:  Jake Chick, DO Vascular: Dr. Sharol Tucker HF: Dr. Haroldine Tucker   HPI: [name] is a 61 year old male with a past medical history of HTN, DM, :PAD S/p right BKA with non-haling wound, admitted for acute HF in 5/17// Echo on 07/10/15 showed EF of 15%, diffuse hypokinesis, PA pressure elevated at 51mmHg.   Was hospitalized 5/5 - 07/20/15. Diuresed 42 pounds 244->202  Admitted 8/3 through 10/10/2015 with marked volume overload in the setting of medication noncompliance. Medication regimen was simplified. Diuresed with IV lasix and transitioned to 80 mg lasix daily. Discharge weight 200 pounds.   He presents today for HF follow up. He has not been weighing himself, only taking medications once daily, refuses to take them more than once a day. Recently had a skin graft placed to his right transtibial amputation site. Not wearing his prothesis as a result, until this heals. Eating some high salt foods, drinking more than 2L a day. Denies SOB at rest, not SOB when transferring from wheelchair to bed. Paramedicine continues to follow him.  Review of systems complete and found to be negative unless listed in HPI.    Cath 07/13/15  Cardiac Cath Findings: Ao = 135/87 (107) LV = 141/20/30 RA = 17 RV = 67/11/20 PA = 62/29 (41) PCW = 35 (v wave 45-50) Fick cardiac output/index = 6.0/2.7 PVR = 1.0 WU SVR = 1198  FA sat = 88% PA sat = 58%, 57%  Labs (5/17): K 4.9 Cr 1.44 Labs (09/19/2015) : K 3.9 Creatinine 1.06 Labs 10/2015: Hgb A1C 13 Labs 12/16/2015: K 4.3 Creatinine 1.27    Past Medical History:  Diagnosis Date  . Acute on chronic systolic heart failure, NYHA class 3 ([place])   . AKI (acute kidney injury) ([place])   . Anemia   . BKA stump complication ([place])   . Blind left eye   . Blind left eye   . Cardiomyopathy, dilated ([place])   . CHF (congestive heart failure) ([place])   .  Combined systolic and diastolic congestive heart failure ([place])   . Congestive dilated cardiomyopathy ([place]) 07/11/2015  . Diabetes mellitus   . Diabetic neuropathy associated with type 2 diabetes mellitus ([place])   . Hypertension   . Neuropathy   . Noncompliance with medications   . Open knee wound 10/2015   on rt bka  . PAD (peripheral artery disease) ([place])   . Protein calorie malnutrition ([place])   . Shortness of breath dyspnea     Current Outpatient Prescriptions  Medication Sig Dispense Refill  . atorvastatin (LIPITOR) 40 MG tablet Take 1 tablet (40 mg total) by mouth daily. 90 tablet 1  . brimonidine (ALPHAGAN) 0.2 % ophthalmic solution PLACE 1 DROP INTO THE LEFT EYE 2 (TWO) TIMES DAILY. 5 mL 0  . carvedilol (COREG) 12.5 MG tablet Take 1 tablet (12.5 mg total) by mouth 2 (two) times daily with a meal. 60 tablet 6  . cholecalciferol (VITAMIN D) 1000 units tablet Take 1,000 Units by mouth daily.    . dorzolamide-timolol (COSOPT) 22.3-6.8 MG/ML ophthalmic solution Place 1 drop into the left eye 2 (two) times daily. 10 mL 12  . furosemide (LASIX) 80 MG tablet Take 80 mg (1 tabs) in am and 40 mg (1/2 tab) in pm 45 tablet 6  . insulin glargine (LANTUS) 100 UNIT/ML injection Inject 15 Units into  the skin at bedtime.    . metFORMIN (GLUCOPHAGE) 500 MG tablet Take 1 tablet (500 mg total) by mouth 2 (two) times daily with a meal. 60 tablet 6  . potassium chloride SA (K-DUR,KLOR-CON) 20 MEQ tablet Take 20 mEq by mouth 2 (two) times daily.    . sacubitril-valsartan (ENTRESTO) 24-26 MG Take 1 tablet by mouth 2 (two) times daily. 60 tablet 11  . silver sulfADIAZINE (SILVADENE) 1 % cream Apply 1 application topically daily. 50 g 2  . vitamin A 10000 UNIT capsule Take 10,000 Units by mouth daily.     No current facility-administered medications for this encounter.     Allergies  Allergen Reactions  . No Known Allergies       Social History   Social History  . Marital status: Married    Spouse  name: N/A  . Number of children: N/A  . Years of education: N/A   Occupational History  . Not on file.   Social History Main Topics  . Smoking status: Former Smoker    Years: 20.00    Quit date: 05/10/2000  . Smokeless tobacco: Former Systems developer    Quit date: 02/22/2000  . Alcohol use No  . Drug use: No  . Sexual activity: Not on file   Other Topics Concern  . Not on file   Social History Narrative   ** Merged History Encounter **       Lives alone.       Family History  Problem Relation Age of Onset  . Cancer Mother   . Peripheral vascular disease Father     Vitals:   08/23/16 1050  BP: (!) 148/108  Pulse: 77  SpO2: 99%  Weight: 211 lb (95.7 kg)     Wt Readings from Last 3 Encounters:  08/23/16 211 lb (95.7 kg)  08/15/16 224 lb (101.6 kg)  08/10/16 224 lb (101.6 kg)     PHYSICAL EXAM: General: Older than stated age appearing male.  HEENT: Blind in L eye Neck: Supple. JVP 7 cm. Carotids 2+ bilat. No thyromegaly or nodule noted.   Cor: PMI non-displaced. Regular rate and rhythm. +S3.  Lungs: Clear in upper lobes bilaterally, diminished in bases. Normal effort.  Abdomen: Soft, non tender, non distended, no HSM. No bruits or masses.   Extremities: No cyanosis, clubbing, or rash.  Neuro: Alert & oriented x 3. Cranial nerves grossly intact. Moves all 4 extremities w/o difficulty. Affect flat  ASSESSMENT & PLAN: 1. Chronic systolic HF due to NICM. 07/2015 ECHo EF 15%. - NYHA II, hard to assess functional capacity as he since he has been inactive due to inability to wear his prothesis.    - Continues to have intermittent compliance with medications. Only taking meds once daily. - Continue lasix 80mg  in the am, Rx says 40mg  in the pm. Will keep the same,but he usually only takes it once a day.  - Continue 40 meq potassium daily.  - No spiro or digoxin with marked non compliance.  - Continue Coreg 12.5 mg BID - Increase Entresto to 49/51mg  BID. He is hypertensive today.   -  Reinforced fluid restriction to < 2 L daily, sodium restriction to less than 2000 mg daily, and the importance of daily weights.   2. HTN - Medication adjustments as above.  3. DM2, poorly controlled - Hgb 10.1 on 05/24/16. Encouraged to follow up with PCP.   - Remains noncompliant.  4. PAD s/p R BKA - Follows with Dr. Sharol Tucker.  Had recent skin graft placed.  5. CKD stage 3  - BMET today.  6. Social - Geophysicist/field seismologist.   BMET today, follow up in 3 months. Continue paramedicine. Stressed the importance of compliance with medications.   Arbutus Leas, NP  10:51 AM

## 2016-08-23 NOTE — Patient Instructions (Signed)
INCREASE Entresto 49/51 mg, one tab twice a day   Your physician recommends that you schedule a follow-up appointment in: 3 months   Do the following things EVERYDAY: 1) Weigh yourself in the morning before breakfast. Write it down and keep it in a log. 2) Take your medicines as prescribed 3) Eat low salt foods-Limit salt (sodium) to 2000 mg per day.  4) Stay as active as you can everyday 5) Limit all fluids for the day to less than 2 liters

## 2016-08-27 ENCOUNTER — Ambulatory Visit (INDEPENDENT_AMBULATORY_CARE_PROVIDER_SITE_OTHER): Payer: Medicaid Other | Admitting: Family

## 2016-08-27 DIAGNOSIS — E1142 Type 2 diabetes mellitus with diabetic polyneuropathy: Secondary | ICD-10-CM

## 2016-08-27 DIAGNOSIS — E1165 Type 2 diabetes mellitus with hyperglycemia: Secondary | ICD-10-CM

## 2016-08-27 DIAGNOSIS — Z794 Long term (current) use of insulin: Secondary | ICD-10-CM

## 2016-08-27 DIAGNOSIS — IMO0002 Reserved for concepts with insufficient information to code with codable children: Secondary | ICD-10-CM

## 2016-08-27 DIAGNOSIS — L89892 Pressure ulcer of other site, stage 2: Secondary | ICD-10-CM

## 2016-08-27 DIAGNOSIS — T879 Unspecified complications of amputation stump: Secondary | ICD-10-CM

## 2016-08-27 NOTE — Progress Notes (Signed)
Post-Op Visit Note   Patient: [name]           Date of Birth: 01/19/1956           MRN: [id number] Visit Date: 08/27/2016 PCP: Steve Rattler, DO  Chief Complaint: No chief complaint on file.   HPI:  The patient is a 61 year old gentleman seen status post split thickness skin grafting right knee. Today presents with no dressing in place. States has been doing silvadene dressing changes. In wheelchair for ambulation.    Ortho Exam Right knee ulcer, skin graft absent. Staples remain in place. Wound now 2 mm deep. Pink tissue in wound bed. 20% superficial exudative tissue. No drainage, no surrounding erythema, no odor. No sign of infection. Does have pitting edema to residual limb.  Visit Diagnoses:  1. Decubitus ulcer of right knee, stage 2   2. Uncontrolled type 2 diabetes mellitus with diabetic polyneuropathy, with long-term current use of insulin ([place])   3. Complications, amputation stump late ([place])     Plan: staples harvested today. continue silvadene dressing changes. Have advised to resume shrinker daily. Follow up in 2 weeks.   Follow-Up Instructions: Return in about 2 weeks (around 09/10/2016).   Imaging: No results found.  Orders:  No orders of the defined types were placed in this encounter.  No orders of the defined types were placed in this encounter.    PMFS History: Patient Active Problem List   Diagnosis Date Noted  . Chronic kidney disease (CKD), stage III (moderate) 06/07/2016  . Chronic combined systolic and diastolic congestive heart failure ([place])   . Protein calorie malnutrition ([place]) 07/12/2015  . Congestive dilated cardiomyopathy ([place]) 07/11/2015  . Pressure ulcer 07/09/2015  . Open knee wound 07/06/2015  . History of right below knee amputation ([place]) 02/22/2015  . PAD (peripheral artery disease) ([place]) 02/22/2015  . Blindness of left eye 01/04/2015  . Complications, amputation stump late ([place]) 08/06/2014  . Diabetic neuropathy  associated with type 2 diabetes mellitus ([place]) 12/01/2013  . Noncompliance with medications 07/13/2010  . OTHER SPEC TYPES SCHIZOPHRENIA UNSPEC CONDITION 05/24/2009  . Obesity, unspecified 06/09/2007  . DM (diabetes mellitus), type 2, uncontrolled ([place]) 05/02/2006  . Essential hypertension, benign 05/02/2006   Past Medical History:  Diagnosis Date  . Acute on chronic systolic heart failure, NYHA class 3 ([place])   . AKI (acute kidney injury) ([place])   . Anemia   . BKA stump complication ([place])   . Blind left eye   . Blind left eye   . Cardiomyopathy, dilated ([place])   . CHF (congestive heart failure) ([place])   . Combined systolic and diastolic congestive heart failure ([place])   . Congestive dilated cardiomyopathy ([place]) 07/11/2015  . Diabetes mellitus   . Diabetic neuropathy associated with type 2 diabetes mellitus ([place])   . Hypertension   . Neuropathy   . Noncompliance with medications   . Open knee wound 10/2015   on rt bka  . PAD (peripheral artery disease) ([place])   . Protein calorie malnutrition ([place])   . Shortness of breath dyspnea     Family History  Problem Relation Age of Onset  . Cancer Mother   . Peripheral vascular disease Father     Past Surgical History:  Procedure Laterality Date  . AMPUTATION Right 09/26/2013   Procedure: AMPUTATION BELOW KNEE;  Surgeon: Rozanna Box, MD;  Location: [facility];  Service: Orthopedics;  Laterality: Right;  . AMPUTATION Right 05/01/2015  Procedure: REVISION OF RIGHT TRANSTIBIAL AMPUTATION ;  Surgeon: Newt Minion, MD;  Location: [facility];  Service: Orthopedics;  Laterality: Right;  . APPLICATION OF WOUND VAC  08/10/2016   Procedure: APPLICATION OFI NCISONAL  WOUND VAC;  Surgeon: Newt Minion, MD;  Location: [facility];  Service: Orthopedics;;  . CARDIAC CATHETERIZATION N/A 07/13/2015   Procedure: Right/Left Heart Cath and Coronary Angiography;  Surgeon: Jolaine Artist, MD;  Location: [facility];  Service: Cardiovascular;  Laterality: N/A;  .  COLONOSCOPY    . I&D EXTREMITY Right 09/24/2013   Procedure: IRRIGATION AND DEBRIDEMENT RIGHT FOOT ULCER;  Surgeon: Renette Butters, MD;  Location: [facility];  Service: Orthopedics;  Laterality: Right;  . I&D EXTREMITY Right 09/26/2013   Procedure: Repeat IRRIGATION AND DEBRIDEMENT Right Foot Ulcer;  Surgeon: Rozanna Box, MD;  Location: [facility];  Service: Orthopedics;  Laterality: Right;  . MULTIPLE TOOTH EXTRACTIONS    . SKIN SPLIT GRAFT Right 08/10/2016   Procedure: SKIN GRAFT SPLIT THICKNESS RIGHT LEG;  Surgeon: Newt Minion, MD;  Location: [facility];  Service: Orthopedics;  Laterality: Right;  . STUMP REVISION Right 04/20/2015   Procedure: Revision Right Below Knee Amputation, Apply Wound VAC;  Surgeon: Newt Minion, MD;  Location: [facility];  Service: Orthopedics;  Laterality: Right;  . TONSILLECTOMY     Social History   Occupational History  . Not on file.   Social History Main Topics  . Smoking status: Former Smoker    Years: 20.00    Quit date: 05/10/2000  . Smokeless tobacco: Former Systems developer    Quit date: 02/22/2000  . Alcohol use No  . Drug use: No  . Sexual activity: Not on file

## 2016-08-28 ENCOUNTER — Other Ambulatory Visit (HOSPITAL_COMMUNITY): Payer: Self-pay

## 2016-08-28 NOTE — Progress Notes (Signed)
Paramedicine Encounter    Patient ID: [name], male    DOB: [birth year], 61 y.o.   MRN: [id number]    Patient Care Team: Steve Rattler, DO as PCP - General  Patient Active Problem List   Diagnosis Date Noted  . Chronic kidney disease (CKD), stage III (moderate) 06/07/2016  . Chronic combined systolic and diastolic congestive heart failure ([place])   . Protein calorie malnutrition ([place]) 07/12/2015  . Congestive dilated cardiomyopathy ([place]) 07/11/2015  . Pressure ulcer 07/09/2015  . Open knee wound 07/06/2015  . History of right below knee amputation ([place]) 02/22/2015  . PAD (peripheral artery disease) ([place]) 02/22/2015  . Blindness of left eye 01/04/2015  . Complications, amputation stump late ([place]) 08/06/2014  . Diabetic neuropathy associated with type 2 diabetes mellitus ([place]) 12/01/2013  . Noncompliance with medications 07/13/2010  . OTHER SPEC TYPES SCHIZOPHRENIA UNSPEC CONDITION 05/24/2009  . Obesity, unspecified 06/09/2007  . DM (diabetes mellitus), type 2, uncontrolled ([place]) 05/02/2006  . Essential hypertension, benign 05/02/2006    Current Outpatient Prescriptions:  .  atorvastatin (LIPITOR) 40 MG tablet, Take 1 tablet (40 mg total) by mouth daily., Disp: 90 tablet, Rfl: 1 .  carvedilol (COREG) 12.5 MG tablet, Take 1 tablet (12.5 mg total) by mouth 2 (two) times daily with a meal., Disp: 60 tablet, Rfl: 6 .  cholecalciferol (VITAMIN D) 1000 units tablet, Take 1,000 Units by mouth daily., Disp: , Rfl:  .  furosemide (LASIX) 80 MG tablet, Take 80 mg (1 tabs) in am and 40 mg (1/2 tab) in pm, Disp: 45 tablet, Rfl: 6 .  metFORMIN (GLUCOPHAGE) 500 MG tablet, Take 1 tablet (500 mg total) by mouth 2 (two) times daily with a meal., Disp: 60 tablet, Rfl: 6 .  potassium chloride SA (K-DUR,KLOR-CON) 20 MEQ tablet, Take 20 mEq by mouth 2 (two) times daily., Disp: , Rfl:  .  brimonidine (ALPHAGAN) 0.2 % ophthalmic solution, PLACE 1 DROP INTO THE LEFT EYE 2 (TWO) TIMES DAILY., Disp: 5  mL, Rfl: 0 .  dorzolamide-timolol (COSOPT) 22.3-6.8 MG/ML ophthalmic solution, Place 1 drop into the left eye 2 (two) times daily., Disp: 10 mL, Rfl: 12 .  insulin glargine (LANTUS) 100 UNIT/ML injection, Inject 15 Units into the skin at bedtime., Disp: , Rfl:  .  sacubitril-valsartan (ENTRESTO) 49-51 MG, Take 1 tablet by mouth 2 (two) times daily., Disp: 60 tablet, Rfl: 3 .  silver sulfADIAZINE (SILVADENE) 1 % cream, Apply 1 application topically daily., Disp: 50 g, Rfl: 2 .  vitamin A 10000 UNIT capsule, Take 10,000 Units by mouth daily., Disp: , Rfl:  Allergies  Allergen Reactions  . No Known Allergies      Social History   Social History  . Marital status: Married    Spouse name: N/A  . Number of children: N/A  . Years of education: N/A   Occupational History  . Not on file.   Social History Main Topics  . Smoking status: Former Smoker    Years: 20.00    Quit date: 05/10/2000  . Smokeless tobacco: Former Systems developer    Quit date: 02/22/2000  . Alcohol use No  . Drug use: No  . Sexual activity: Not on file   Other Topics Concern  . Not on file   Social History Narrative   ** Merged History Encounter **       Lives alone.     Physical Exam  Pulmonary/Chest: No respiratory distress. He has no wheezes. He has no rales.  Abdominal: He exhibits no distension. There is no tenderness. There is no guarding.  Musculoskeletal: He exhibits edema.  Skin: Skin is warm and dry. He is not diaphoretic.        Future Appointments Date Time Provider [place]  09/10/2016 12:30 PM Suzan Slick, NP [facility] None  11/22/2016 11:00 AM [facility] PA/NP [facility] None    ATF pt [name] x4 sitting in the living room eating beef o roni that his aide had left for him.  Pt stated that he fell off of his bed "the other day" but did not hurt himself.  He denies sob, dizziness, headache and chest pain.  Pt had taken about 7 medication doses within the past week.  Pt stated that he weighed himself  without his leg.  rx bottles verified and pill box refilled.   BP (!) 142/100 (BP Location: Right Arm, Patient Position: Sitting, Cuff Size: Normal)   Pulse 64   Resp 16   Wt 194 lb (88 kg)   SpO2 96%   BMI 26.31 kg/m   cbg 109  **rx called in today: [id number] furosemide [id number] atorvastatin [id number] entresto (49-51)    Demetria Copper, EMT Paramedic 08/28/2016    ACTION: Home visit completed

## 2016-09-10 ENCOUNTER — Ambulatory Visit (INDEPENDENT_AMBULATORY_CARE_PROVIDER_SITE_OTHER): Payer: Medicaid Other | Admitting: Orthopedic Surgery

## 2016-09-10 ENCOUNTER — Encounter (INDEPENDENT_AMBULATORY_CARE_PROVIDER_SITE_OTHER): Payer: Self-pay | Admitting: Orthopedic Surgery

## 2016-09-10 VITALS — Ht 72.0 in | Wt 194.0 lb

## 2016-09-10 DIAGNOSIS — L89892 Pressure ulcer of other site, stage 2: Secondary | ICD-10-CM

## 2016-09-10 NOTE — Progress Notes (Signed)
Office Visit Note   Patient: [name]           Date of Birth: 05-06-1955           MRN: [id number] Visit Date: 09/10/2016              Requested by: Steve Rattler, DO [place], [place] [address] PCP: Steve Rattler, DO  Chief Complaint  Patient presents with  . Right Leg - Follow-up    08/10/16 Skin graft split thickness right leg with prevena wound vac. 31 days post op.       HPI: Patient presents follow-up status post skin graft to a large ulcer over the distal pole of the patella right knee with transtibial amputation. Patient is currently doing Silvadene dressing changes.  Assessment & Plan: Visit Diagnoses:  1. Decubitus ulcer of right knee, stage 2     Plan: We'll apply Iodosorb dressing today continue with daily soap and water dressing changes with Silvadene.  Follow-Up Instructions: Return in about 2 weeks (around 09/24/2016).   Ortho Exam  Patient is alert, oriented, no adenopathy, well-dressed, normal affect, normal respiratory effort. Examination the ulcer measures 5.5 x 6.4 cm approximately 2 mm deep. There is 100% beefy granulation tissue no abscess no drainage no exposed tendon no exposed bone.  Imaging: No results found.  Labs: Lab Results  Component Value Date   HGBA1C 10.1 (H) 05/24/2016   HGBA1C 13.1 (H) 10/06/2015   HGBA1C 12.3 07/06/2015   ESRSEDRATE 114 (H) 09/23/2013   CRP 37.3 (H) 09/23/2013   REPTSTATUS 09/29/2013 FINAL 09/24/2013   REPTSTATUS 09/27/2013 FINAL 09/24/2013   GRAMSTAIN  09/24/2013    MODERATE WBC PRESENT,BOTH PMN AND MONONUCLEAR MODERATE GRAM POSITIVE COCCI IN PAIRS FEW GRAM VARIABLE ROD Performed at [facility]  09/24/2013    MODERATE WBC PRESENT,BOTH PMN AND MONONUCLEAR ABUNDANT GRAM POSITIVE COCCI IN PAIRS FEW GRAM VARIABLE ROD Performed at [facility]   CULT  09/24/2013    NO ANAEROBES ISOLATED Performed at [facility]  09/24/2013    MULTIPLE  ORGANISMS PRESENT, NONE PREDOMINANT NO STAPHYLOCOCCUS AUREUS ISOLATED NO GROUP A STREP (S.PYOGENES) ISOLATED Performed at [facility]    Orders:  No orders of the defined types were placed in this encounter.  No orders of the defined types were placed in this encounter.    Procedures: No procedures performed  Clinical Data: No additional findings.  ROS:  All other systems negative, except as noted in the HPI. Review of Systems  Objective: Vital Signs: Ht 6' (1.829 m)   Wt 194 lb (88 kg)   BMI 26.31 kg/m   Specialty Comments:  No specialty comments available.  PMFS History: Patient Active Problem List   Diagnosis Date Noted  . Chronic kidney disease (CKD), stage III (moderate) 06/07/2016  . Chronic combined systolic and diastolic congestive heart failure ([place])   . Protein calorie malnutrition ([place]) 07/12/2015  . Congestive dilated cardiomyopathy ([place]) 07/11/2015  . Pressure ulcer 07/09/2015  . Open knee wound 07/06/2015  . History of right below knee amputation ([place]) 02/22/2015  . PAD (peripheral artery disease) ([place]) 02/22/2015  . Blindness of left eye 01/04/2015  . Complications, amputation stump late ([place]) 08/06/2014  . Diabetic neuropathy associated with type 2 diabetes mellitus ([place]) 12/01/2013  . Noncompliance with medications 07/13/2010  . OTHER SPEC TYPES SCHIZOPHRENIA UNSPEC CONDITION 05/24/2009  . Obesity, unspecified 06/09/2007  . DM (diabetes mellitus),  type 2, uncontrolled ([place]) 05/02/2006  . Essential hypertension, benign 05/02/2006   Past Medical History:  Diagnosis Date  . Acute on chronic systolic heart failure, NYHA class 3 ([place])   . AKI (acute kidney injury) ([place])   . Anemia   . BKA stump complication ([place])   . Blind left eye   . Blind left eye   . Cardiomyopathy, dilated ([place])   . CHF (congestive heart failure) ([place])   . Combined systolic and diastolic congestive heart failure ([place])   . Congestive dilated cardiomyopathy ([place])  07/11/2015  . Diabetes mellitus   . Diabetic neuropathy associated with type 2 diabetes mellitus ([place])   . Hypertension   . Neuropathy   . Noncompliance with medications   . Open knee wound 10/2015   on rt bka  . PAD (peripheral artery disease) ([place])   . Protein calorie malnutrition ([place])   . Shortness of breath dyspnea     Family History  Problem Relation Age of Onset  . Cancer Mother   . Peripheral vascular disease Father     Past Surgical History:  Procedure Laterality Date  . AMPUTATION Right 09/26/2013   Procedure: AMPUTATION BELOW KNEE;  Surgeon: Rozanna Box, MD;  Location: [facility];  Service: Orthopedics;  Laterality: Right;  . AMPUTATION Right 05/01/2015   Procedure: REVISION OF RIGHT TRANSTIBIAL AMPUTATION ;  Surgeon: Newt Minion, MD;  Location: [facility];  Service: Orthopedics;  Laterality: Right;  . APPLICATION OF WOUND VAC  08/10/2016   Procedure: APPLICATION OFI NCISONAL  WOUND VAC;  Surgeon: Newt Minion, MD;  Location: [facility];  Service: Orthopedics;;  . CARDIAC CATHETERIZATION N/A 07/13/2015   Procedure: Right/Left Heart Cath and Coronary Angiography;  Surgeon: Jolaine Artist, MD;  Location: [facility];  Service: Cardiovascular;  Laterality: N/A;  . COLONOSCOPY    . I&D EXTREMITY Right 09/24/2013   Procedure: IRRIGATION AND DEBRIDEMENT RIGHT FOOT ULCER;  Surgeon: Renette Butters, MD;  Location: [facility];  Service: Orthopedics;  Laterality: Right;  . I&D EXTREMITY Right 09/26/2013   Procedure: Repeat IRRIGATION AND DEBRIDEMENT Right Foot Ulcer;  Surgeon: Rozanna Box, MD;  Location: [facility];  Service: Orthopedics;  Laterality: Right;  . MULTIPLE TOOTH EXTRACTIONS    . SKIN SPLIT GRAFT Right 08/10/2016   Procedure: SKIN GRAFT SPLIT THICKNESS RIGHT LEG;  Surgeon: Newt Minion, MD;  Location: [facility];  Service: Orthopedics;  Laterality: Right;  . STUMP REVISION Right 04/20/2015   Procedure: Revision Right Below Knee Amputation, Apply Wound VAC;  Surgeon: Newt Minion,  MD;  Location: [facility];  Service: Orthopedics;  Laterality: Right;  . TONSILLECTOMY     Social History   Occupational History  . Not on file.   Social History Main Topics  . Smoking status: Former Smoker    Years: 20.00    Quit date: 05/10/2000  . Smokeless tobacco: Former Systems developer    Quit date: 02/22/2000  . Alcohol use No  . Drug use: No  . Sexual activity: Not on file

## 2016-09-11 ENCOUNTER — Other Ambulatory Visit (HOSPITAL_COMMUNITY): Payer: Self-pay

## 2016-09-11 NOTE — Progress Notes (Signed)
Paramedicine Encounter    Patient ID: [name], male    DOB: April 17, 1955, 61 y.o.   MRN: [id number]    Patient Care Team: Steve Rattler, DO as PCP - General  Patient Active Problem List   Diagnosis Date Noted  . Chronic kidney disease (CKD), stage III (moderate) 06/07/2016  . Chronic combined systolic and diastolic congestive heart failure ([place])   . Protein calorie malnutrition ([place]) 07/12/2015  . Congestive dilated cardiomyopathy ([place]) 07/11/2015  . Pressure ulcer 07/09/2015  . Open knee wound 07/06/2015  . History of right below knee amputation ([place]) 02/22/2015  . PAD (peripheral artery disease) ([place]) 02/22/2015  . Blindness of left eye 01/04/2015  . Complications, amputation stump late ([place]) 08/06/2014  . Diabetic neuropathy associated with type 2 diabetes mellitus ([place]) 12/01/2013  . Noncompliance with medications 07/13/2010  . OTHER SPEC TYPES SCHIZOPHRENIA UNSPEC CONDITION 05/24/2009  . Obesity, unspecified 06/09/2007  . DM (diabetes mellitus), type 2, uncontrolled ([place]) 05/02/2006  . Essential hypertension, benign 05/02/2006    Current Outpatient Prescriptions:  .  atorvastatin (LIPITOR) 40 MG tablet, Take 1 tablet (40 mg total) by mouth daily., Disp: 90 tablet, Rfl: 1 .  brimonidine (ALPHAGAN) 0.2 % ophthalmic solution, PLACE 1 DROP INTO THE LEFT EYE 2 (TWO) TIMES DAILY., Disp: 5 mL, Rfl: 0 .  carvedilol (COREG) 12.5 MG tablet, Take 1 tablet (12.5 mg total) by mouth 2 (two) times daily with a meal., Disp: 60 tablet, Rfl: 6 .  cholecalciferol (VITAMIN D) 1000 units tablet, Take 1,000 Units by mouth daily., Disp: , Rfl:  .  dorzolamide-timolol (COSOPT) 22.3-6.8 MG/ML ophthalmic solution, Place 1 drop into the left eye 2 (two) times daily., Disp: 10 mL, Rfl: 12 .  furosemide (LASIX) 80 MG tablet, Take 80 mg (1 tabs) in am and 40 mg (1/2 tab) in pm, Disp: 45 tablet, Rfl: 6 .  insulin glargine (LANTUS) 100 UNIT/ML injection, Inject 15 Units into the skin at bedtime.,  Disp: , Rfl:  .  metFORMIN (GLUCOPHAGE) 500 MG tablet, Take 1 tablet (500 mg total) by mouth 2 (two) times daily with a meal., Disp: 60 tablet, Rfl: 6 .  potassium chloride SA (K-DUR,KLOR-CON) 20 MEQ tablet, Take 20 mEq by mouth 2 (two) times daily., Disp: , Rfl:  .  sacubitril-valsartan (ENTRESTO) 49-51 MG, Take 1 tablet by mouth 2 (two) times daily., Disp: 60 tablet, Rfl: 3 .  silver sulfADIAZINE (SILVADENE) 1 % cream, Apply 1 application topically daily., Disp: 50 g, Rfl: 2 .  vitamin A 10000 UNIT capsule, Take 10,000 Units by mouth daily., Disp: , Rfl:  Allergies  Allergen Reactions  . No Known Allergies      Social History   Social History  . Marital status: Married    Spouse name: N/A  . Number of children: N/A  . Years of education: N/A   Occupational History  . Not on file.   Social History Main Topics  . Smoking status: Former Smoker    Years: 20.00    Quit date: 05/10/2000  . Smokeless tobacco: Former Systems developer    Quit date: 02/22/2000  . Alcohol use No  . Drug use: No  . Sexual activity: Not on file   Other Topics Concern  . Not on file   Social History Narrative   ** Merged History Encounter **       Lives alone.     Physical Exam      Future Appointments Date Time Provider [place]  09/24/2016  2:15 PM Newt Minion, MD [facility] None  10/09/2016 8:30 AM Steve Rattler, DO [facility]  11/22/2016 11:00 AM [facility] PA/NP [facility] None    ATF pt [name] x4 sitting in his wheelchair eating a bag of bar b que skins.  Pt also had soda bottles in the trash and a tall glass of coco cola on the table.  Pt hasn't taken any medications within the past 1.5 weeks.  He denies sob, dizziness, headache and chest pain.  Pt still isn't weighing himself .    BP (!) 148/50 (BP Location: Left Arm, Patient Position: Sitting, Cuff Size: Normal)   Pulse 67     Demetria Copper, EMT Paramedic 09/11/2016    ACTION: Home visit completed

## 2016-09-19 ENCOUNTER — Other Ambulatory Visit (HOSPITAL_COMMUNITY): Payer: Self-pay

## 2016-09-19 NOTE — Progress Notes (Signed)
Paramedicine Encounter    Patient ID: [name], male    DOB: 01/06/1956, 61 y.o.   MRN: [id number]    Patient Care Team: Steve Rattler, DO as PCP - General  Patient Active Problem List   Diagnosis Date Noted  . Chronic kidney disease (CKD), stage III (moderate) 06/07/2016  . Chronic combined systolic and diastolic congestive heart failure ([place])   . Protein calorie malnutrition ([place]) 07/12/2015  . Congestive dilated cardiomyopathy ([place]) 07/11/2015  . Pressure ulcer 07/09/2015  . Open knee wound 07/06/2015  . History of right below knee amputation ([place]) 02/22/2015  . PAD (peripheral artery disease) ([place]) 02/22/2015  . Blindness of left eye 01/04/2015  . Complications, amputation stump late ([place]) 08/06/2014  . Diabetic neuropathy associated with type 2 diabetes mellitus ([place]) 12/01/2013  . Noncompliance with medications 07/13/2010  . OTHER SPEC TYPES SCHIZOPHRENIA UNSPEC CONDITION 05/24/2009  . Obesity, unspecified 06/09/2007  . DM (diabetes mellitus), type 2, uncontrolled ([place]) 05/02/2006  . Essential hypertension, benign 05/02/2006    Current Outpatient Prescriptions:  .  atorvastatin (LIPITOR) 40 MG tablet, Take 1 tablet (40 mg total) by mouth daily., Disp: 90 tablet, Rfl: 1 .  carvedilol (COREG) 12.5 MG tablet, Take 1 tablet (12.5 mg total) by mouth 2 (two) times daily with a meal., Disp: 60 tablet, Rfl: 6 .  furosemide (LASIX) 80 MG tablet, Take 80 mg (1 tabs) in am and 40 mg (1/2 tab) in pm, Disp: 45 tablet, Rfl: 6 .  metFORMIN (GLUCOPHAGE) 500 MG tablet, Take 1 tablet (500 mg total) by mouth 2 (two) times daily with a meal., Disp: 60 tablet, Rfl: 6 .  potassium chloride SA (K-DUR,KLOR-CON) 20 MEQ tablet, Take 20 mEq by mouth 2 (two) times daily., Disp: , Rfl:  .  sacubitril-valsartan (ENTRESTO) 49-51 MG, Take 1 tablet by mouth 2 (two) times daily., Disp: 60 tablet, Rfl: 3 .  brimonidine (ALPHAGAN) 0.2 % ophthalmic solution, PLACE 1 DROP INTO THE LEFT EYE 2 (TWO) TIMES  DAILY., Disp: 5 mL, Rfl: 0 .  cholecalciferol (VITAMIN D) 1000 units tablet, Take 1,000 Units by mouth daily., Disp: , Rfl:  .  dorzolamide-timolol (COSOPT) 22.3-6.8 MG/ML ophthalmic solution, Place 1 drop into the left eye 2 (two) times daily., Disp: 10 mL, Rfl: 12 .  insulin glargine (LANTUS) 100 UNIT/ML injection, Inject 15 Units into the skin at bedtime., Disp: , Rfl:  .  silver sulfADIAZINE (SILVADENE) 1 % cream, Apply 1 application topically daily., Disp: 50 g, Rfl: 2 .  vitamin A 10000 UNIT capsule, Take 10,000 Units by mouth daily., Disp: , Rfl:  Allergies  Allergen Reactions  . No Known Allergies      Social History   Social History  . Marital status: Married    Spouse name: N/A  . Number of children: N/A  . Years of education: N/A   Occupational History  . Not on file.   Social History Main Topics  . Smoking status: Former Smoker    Years: 20.00    Quit date: 05/10/2000  . Smokeless tobacco: Former Systems developer    Quit date: 02/22/2000  . Alcohol use No  . Drug use: No  . Sexual activity: Not on file   Other Topics Concern  . Not on file   Social History Narrative   ** Merged History Encounter **       Lives alone.     Physical Exam  Pulmonary/Chest: No respiratory distress. He has no wheezes. He has no rales. He  exhibits no tenderness.  Abdominal: There is no tenderness. There is no guarding.  Musculoskeletal: He exhibits edema.  Skin: Skin is warm and dry. He is not diaphoretic.        Future Appointments Date Time Provider [place]  09/24/2016 2:15 PM Newt Minion, MD [facility] None  10/09/2016 8:30 AM Steve Rattler, DO [facility]  11/22/2016 11:00 AM [facility] PA/NP [facility] None    ATF pt [name] x4 c/o dry cough and not being able to urinate.  The lack of urination has been a complaint before, in which pt takes more fluid pills (which was not advised).  Pt's aid reported that the cough has gotten better.  Pt is also having more than normal issues  with sleeping within these past few weeks.  He hasn't been able to weigh himself because he can't wear his prostatic leg due to swelling from the procedure that he had a while ago.    Pt has old blood and rips in his dressing that was applied to his amputated leg.  Pt admits to crawling on his legs which was also not advised. He has a new sore on the anterior lower left leg, about 1 inch wide, circular and bright red. He stated that he "hurt" it while getting out of his bed.    Pt has only taken 3 doses of his medications since I last seen him.  rx bottles verified and pill box refilled.   *rx caled in: atrotastin (filled in sun morning/no pills in mon entresto 56- 51  I will try to transfer pt's prescriptions to [facility] which delivers and has pre-package options  BP 136/90 (BP Location: Right Arm, Patient Position: Sitting, Cuff Size: Normal)   Pulse 64   Resp 16   SpO2 97%   cbg 129  Demetria Copper, EMT Paramedic 09/19/2016    ACTION: Home visit completed Next visit planned for next wednesday

## 2016-09-24 ENCOUNTER — Encounter (HOSPITAL_COMMUNITY): Payer: Self-pay | Admitting: Emergency Medicine

## 2016-09-24 ENCOUNTER — Inpatient Hospital Stay (HOSPITAL_COMMUNITY): Payer: Medicaid Other

## 2016-09-24 ENCOUNTER — Emergency Department (HOSPITAL_COMMUNITY): Payer: Medicaid Other

## 2016-09-24 ENCOUNTER — Ambulatory Visit (INDEPENDENT_AMBULATORY_CARE_PROVIDER_SITE_OTHER): Payer: Medicaid Other | Admitting: Orthopedic Surgery

## 2016-09-24 ENCOUNTER — Inpatient Hospital Stay (HOSPITAL_COMMUNITY)
Admission: EM | Admit: 2016-09-24 | Discharge: 2016-09-30 | DRG: 291 | Disposition: A | Discharge status: Home Health | Payer: Medicaid Other | Attending: Family Medicine | Admitting: Family Medicine

## 2016-09-24 DIAGNOSIS — N433 Hydrocele, unspecified: Secondary | ICD-10-CM | POA: Diagnosis present

## 2016-09-24 DIAGNOSIS — L97919 Non-pressure chronic ulcer of unspecified part of right lower leg with unspecified severity: Secondary | ICD-10-CM | POA: Diagnosis present

## 2016-09-24 DIAGNOSIS — L97929 Non-pressure chronic ulcer of unspecified part of left lower leg with unspecified severity: Secondary | ICD-10-CM | POA: Diagnosis present

## 2016-09-24 DIAGNOSIS — S81809A Unspecified open wound, unspecified lower leg, initial encounter: Secondary | ICD-10-CM

## 2016-09-24 DIAGNOSIS — E1151 Type 2 diabetes mellitus with diabetic peripheral angiopathy without gangrene: Secondary | ICD-10-CM | POA: Diagnosis present

## 2016-09-24 DIAGNOSIS — E1140 Type 2 diabetes mellitus with diabetic neuropathy, unspecified: Secondary | ICD-10-CM | POA: Diagnosis present

## 2016-09-24 DIAGNOSIS — E669 Obesity, unspecified: Secondary | ICD-10-CM | POA: Diagnosis present

## 2016-09-24 DIAGNOSIS — R339 Retention of urine, unspecified: Secondary | ICD-10-CM | POA: Diagnosis present

## 2016-09-24 DIAGNOSIS — L89892 Pressure ulcer of other site, stage 2: Secondary | ICD-10-CM | POA: Diagnosis present

## 2016-09-24 DIAGNOSIS — N1831 Chronic kidney disease, stage 3a: Secondary | ICD-10-CM | POA: Diagnosis present

## 2016-09-24 DIAGNOSIS — I509 Heart failure, unspecified: Secondary | ICD-10-CM

## 2016-09-24 DIAGNOSIS — E11621 Type 2 diabetes mellitus with foot ulcer: Secondary | ICD-10-CM

## 2016-09-24 DIAGNOSIS — Z6831 Body mass index (BMI) 31.0-31.9, adult: Secondary | ICD-10-CM

## 2016-09-24 DIAGNOSIS — I5043 Acute on chronic combined systolic (congestive) and diastolic (congestive) heart failure: Secondary | ICD-10-CM

## 2016-09-24 DIAGNOSIS — I361 Nonrheumatic tricuspid (valve) insufficiency: Secondary | ICD-10-CM | POA: Diagnosis not present

## 2016-09-24 DIAGNOSIS — E1165 Type 2 diabetes mellitus with hyperglycemia: Secondary | ICD-10-CM | POA: Diagnosis present

## 2016-09-24 DIAGNOSIS — H5462 Unqualified visual loss, left eye, normal vision right eye: Secondary | ICD-10-CM | POA: Diagnosis present

## 2016-09-24 DIAGNOSIS — N183 Chronic kidney disease, stage 3 unspecified: Secondary | ICD-10-CM | POA: Diagnosis present

## 2016-09-24 DIAGNOSIS — I130 Hypertensive heart and chronic kidney disease with heart failure and stage 1 through stage 4 chronic kidney disease, or unspecified chronic kidney disease: Principal | ICD-10-CM | POA: Diagnosis present

## 2016-09-24 DIAGNOSIS — Z9119 Patient's noncompliance with other medical treatment and regimen: Secondary | ICD-10-CM

## 2016-09-24 DIAGNOSIS — N5089 Other specified disorders of the male genital organs: Secondary | ICD-10-CM

## 2016-09-24 DIAGNOSIS — Z993 Dependence on wheelchair: Secondary | ICD-10-CM | POA: Diagnosis not present

## 2016-09-24 DIAGNOSIS — I5021 Acute systolic (congestive) heart failure: Secondary | ICD-10-CM | POA: Diagnosis present

## 2016-09-24 DIAGNOSIS — I739 Peripheral vascular disease, unspecified: Secondary | ICD-10-CM | POA: Diagnosis not present

## 2016-09-24 DIAGNOSIS — E119 Type 2 diabetes mellitus without complications: Secondary | ICD-10-CM | POA: Diagnosis present

## 2016-09-24 DIAGNOSIS — E11622 Type 2 diabetes mellitus with other skin ulcer: Secondary | ICD-10-CM | POA: Diagnosis present

## 2016-09-24 DIAGNOSIS — I10 Essential (primary) hypertension: Secondary | ICD-10-CM | POA: Diagnosis not present

## 2016-09-24 DIAGNOSIS — E1142 Type 2 diabetes mellitus with diabetic polyneuropathy: Secondary | ICD-10-CM | POA: Diagnosis not present

## 2016-09-24 DIAGNOSIS — I420 Dilated cardiomyopathy: Secondary | ICD-10-CM | POA: Diagnosis present

## 2016-09-24 DIAGNOSIS — E11649 Type 2 diabetes mellitus with hypoglycemia without coma: Secondary | ICD-10-CM | POA: Diagnosis present

## 2016-09-24 DIAGNOSIS — Z87891 Personal history of nicotine dependence: Secondary | ICD-10-CM | POA: Diagnosis not present

## 2016-09-24 DIAGNOSIS — R338 Other retention of urine: Secondary | ICD-10-CM

## 2016-09-24 DIAGNOSIS — Z89511 Acquired absence of right leg below knee: Secondary | ICD-10-CM | POA: Diagnosis not present

## 2016-09-24 DIAGNOSIS — I5042 Chronic combined systolic (congestive) and diastolic (congestive) heart failure: Secondary | ICD-10-CM | POA: Diagnosis not present

## 2016-09-24 DIAGNOSIS — N19 Unspecified kidney failure: Secondary | ICD-10-CM | POA: Diagnosis not present

## 2016-09-24 DIAGNOSIS — Z794 Long term (current) use of insulin: Secondary | ICD-10-CM

## 2016-09-24 DIAGNOSIS — E1149 Type 2 diabetes mellitus with other diabetic neurological complication: Secondary | ICD-10-CM | POA: Diagnosis not present

## 2016-09-24 DIAGNOSIS — E1122 Type 2 diabetes mellitus with diabetic chronic kidney disease: Secondary | ICD-10-CM | POA: Diagnosis present

## 2016-09-24 DIAGNOSIS — H5440 Blindness, one eye, unspecified eye: Secondary | ICD-10-CM | POA: Diagnosis not present

## 2016-09-24 DIAGNOSIS — Z79899 Other long term (current) drug therapy: Secondary | ICD-10-CM

## 2016-09-24 DIAGNOSIS — I1310 Hypertensive heart and chronic kidney disease without heart failure, with stage 1 through stage 4 chronic kidney disease, or unspecified chronic kidney disease: Secondary | ICD-10-CM

## 2016-09-24 DIAGNOSIS — H547 Unspecified visual loss: Secondary | ICD-10-CM | POA: Diagnosis present

## 2016-09-24 DIAGNOSIS — B356 Tinea cruris: Secondary | ICD-10-CM | POA: Diagnosis present

## 2016-09-24 DIAGNOSIS — IMO0002 Reserved for concepts with insufficient information to code with codable children: Secondary | ICD-10-CM | POA: Diagnosis present

## 2016-09-24 LAB — CBC WITH DIFFERENTIAL/PLATELET
Basophils Absolute: 0 10*3/uL (ref 0.0–0.1)
Basophils Relative: 0 %
Eosinophils Absolute: 0 10*3/uL (ref 0.0–0.7)
Eosinophils Relative: 0 %
HCT: 38 % — ABNORMAL LOW (ref 39.0–52.0)
Hemoglobin: 12.7 g/dL — ABNORMAL LOW (ref 13.0–17.0)
Lymphocytes Relative: 8 %
Lymphs Abs: 0.7 10*3/uL (ref 0.7–4.0)
MCH: 25.8 pg — ABNORMAL LOW (ref 26.0–34.0)
MCHC: 33.4 g/dL (ref 30.0–36.0)
MCV: 77.2 fL — ABNORMAL LOW (ref 78.0–100.0)
Monocytes Absolute: 0.8 10*3/uL (ref 0.1–1.0)
Monocytes Relative: 10 %
Neutro Abs: 6.8 10*3/uL (ref 1.7–7.7)
Neutrophils Relative %: 82 %
Platelets: 312 10*3/uL (ref 150–400)
RBC: 4.92 MIL/uL (ref 4.22–5.81)
RDW: 19.3 % — ABNORMAL HIGH (ref 11.5–15.5)
WBC: 8.3 10*3/uL (ref 4.0–10.5)

## 2016-09-24 LAB — BASIC METABOLIC PANEL
Anion gap: 8 (ref 5–15)
BUN: 40 mg/dL — ABNORMAL HIGH (ref 6–20)
CO2: 25 mmol/L (ref 22–32)
Calcium: 8.4 mg/dL — ABNORMAL LOW (ref 8.9–10.3)
Chloride: 106 mmol/L (ref 101–111)
Creatinine, Ser: 1.09 mg/dL (ref 0.61–1.24)
GFR calc Af Amer: >60 mL/min (ref >60)
GFR calc non Af Amer: >60 mL/min (ref >60)
Glucose, Bld: 99 mg/dL (ref 65–99)
Potassium: 3.8 mmol/L (ref 3.5–5.1)
Sodium: 139 mmol/L (ref 135–145)

## 2016-09-24 LAB — URINALYSIS, ROUTINE W REFLEX MICROSCOPIC
Bilirubin Urine: NEGATIVE
Glucose, UA: NEGATIVE mg/dL
Ketones, ur: NEGATIVE mg/dL
Leukocytes, UA: NEGATIVE
Nitrite: NEGATIVE
Protein, ur: >=300 mg/dL — AB
Specific Gravity, Urine: 1.019 (ref 1.005–1.030)
pH: 5 (ref 5.0–8.0)

## 2016-09-24 LAB — I-STAT CG4 LACTIC ACID, ED: Lactic Acid, Venous: 1.3 mmol/L (ref 0.5–1.9)

## 2016-09-24 LAB — CREATININE, SERUM
Creatinine, Ser: 1.24 mg/dL (ref 0.61–1.24)
GFR calc Af Amer: >60 mL/min (ref >60)
GFR calc non Af Amer: >60 mL/min (ref >60)

## 2016-09-24 LAB — CBG MONITORING, ED: Glucose-Capillary: 98 mg/dL (ref 65–99)

## 2016-09-24 LAB — BRAIN NATRIURETIC PEPTIDE: B Natriuretic Peptide: 3276.9 pg/mL — ABNORMAL HIGH (ref 0.0–100.0)

## 2016-09-24 LAB — CBC
HCT: 39.5 % (ref 39.0–52.0)
Hemoglobin: 13.3 g/dL (ref 13.0–17.0)
MCH: 25.8 pg — ABNORMAL LOW (ref 26.0–34.0)
MCHC: 33.7 g/dL (ref 30.0–36.0)
MCV: 76.6 fL — ABNORMAL LOW (ref 78.0–100.0)
Platelets: 327 10*3/uL (ref 150–400)
RBC: 5.16 MIL/uL (ref 4.22–5.81)
RDW: 19.4 % — ABNORMAL HIGH (ref 11.5–15.5)
WBC: 8.3 10*3/uL (ref 4.0–10.5)

## 2016-09-24 LAB — ECHOCARDIOGRAM COMPLETE

## 2016-09-24 LAB — GLUCOSE, CAPILLARY
Glucose-Capillary: 78 mg/dL (ref 65–99)
Glucose-Capillary: 76 mg/dL (ref 65–99)

## 2016-09-24 LAB — TSH: TSH: 1.323 u[IU]/mL (ref 0.350–4.500)

## 2016-09-24 MED ORDER — SODIUM CHLORIDE 0.9% FLUSH
3.0000 mL | Freq: Two times a day (BID) | INTRAVENOUS | Status: DC
  Administered 2016-09-24 – 2016-09-29 (×11): 3 mL via INTRAVENOUS

## 2016-09-24 MED ORDER — PIPERACILLIN-TAZOBACTAM 3.375 G IVPB 30 MIN
3.3750 g | Freq: Once | INTRAVENOUS | Status: DC
  Filled 2016-09-24: qty 50

## 2016-09-24 MED ORDER — POTASSIUM CHLORIDE CRYS ER 20 MEQ PO TBCR
20.0000 meq | EXTENDED_RELEASE_TABLET | Freq: Two times a day (BID) | ORAL | Status: DC
  Administered 2016-09-24 – 2016-09-29 (×11): 20 meq via ORAL
  Filled 2016-09-24 (×12): qty 1

## 2016-09-24 MED ORDER — ATORVASTATIN CALCIUM 40 MG PO TABS
40.0000 mg | ORAL_TABLET | Freq: Every day | ORAL | Status: DC
  Administered 2016-09-25 – 2016-09-29 (×5): 40 mg via ORAL
  Filled 2016-09-24 (×6): qty 1

## 2016-09-24 MED ORDER — DORZOLAMIDE HCL-TIMOLOL MAL 2-0.5 % OP SOLN
1.0000 [drp] | Freq: Two times a day (BID) | OPHTHALMIC | Status: DC
  Administered 2016-09-24 – 2016-09-29 (×11): 1 [drp] via OPHTHALMIC
  Filled 2016-09-24: qty 10

## 2016-09-24 MED ORDER — VANCOMYCIN HCL IN DEXTROSE 1-5 GM/200ML-% IV SOLN
1000.0000 mg | Freq: Once | INTRAVENOUS | Status: DC
  Filled 2016-09-24: qty 200

## 2016-09-24 MED ORDER — INSULIN ASPART 100 UNIT/ML ~~LOC~~ SOLN: 0.0000 [IU] | Freq: Every day | SUBCUTANEOUS | Status: DC

## 2016-09-24 MED ORDER — INSULIN GLARGINE 100 UNIT/ML ~~LOC~~ SOLN
10.0000 [IU] | Freq: Every day | SUBCUTANEOUS | Status: DC
  Administered 2016-09-24 – 2016-09-25 (×2): 10 [IU] via SUBCUTANEOUS
  Filled 2016-09-24 (×2): qty 0.1

## 2016-09-24 MED ORDER — INSULIN ASPART 100 UNIT/ML ~~LOC~~ SOLN: 0.0000 [IU] | Freq: Three times a day (TID) | SUBCUTANEOUS | Status: DC

## 2016-09-24 MED ORDER — ACETAMINOPHEN 325 MG PO TABS
650.0000 mg | ORAL_TABLET | ORAL | Status: DC | PRN
  Administered 2016-09-27 (×2): 650 mg via ORAL
  Filled 2016-09-24 (×2): qty 2

## 2016-09-24 MED ORDER — SODIUM CHLORIDE 0.9 % IV SOLN: 250.0000 mL | INTRAVENOUS | Status: DC | PRN

## 2016-09-24 MED ORDER — CARVEDILOL 12.5 MG PO TABS
12.5000 mg | ORAL_TABLET | Freq: Two times a day (BID) | ORAL | Status: DC
  Administered 2016-09-25 – 2016-09-30 (×11): 12.5 mg via ORAL
  Filled 2016-09-24 (×12): qty 1

## 2016-09-24 MED ORDER — ONDANSETRON HCL 4 MG/2ML IJ SOLN: 4.0000 mg | Freq: Four times a day (QID) | INTRAMUSCULAR | Status: DC | PRN

## 2016-09-24 MED ORDER — LOPERAMIDE HCL 2 MG PO CAPS: 4.0000 mg | ORAL_CAPSULE | Freq: Once | ORAL | Status: DC

## 2016-09-24 MED ORDER — VITAMIN D3 25 MCG (1000 UNIT) PO TABS
1000.0000 [IU] | ORAL_TABLET | Freq: Every day | ORAL | Status: DC
  Administered 2016-09-25 – 2016-09-29 (×5): 1000 [IU] via ORAL
  Filled 2016-09-24 (×6): qty 1

## 2016-09-24 MED ORDER — BRIMONIDINE TARTRATE 0.2 % OP SOLN
1.0000 [drp] | Freq: Three times a day (TID) | OPHTHALMIC | Status: DC
  Administered 2016-09-24 – 2016-09-29 (×16): 1 [drp] via OPHTHALMIC
  Filled 2016-09-24 (×2): qty 5

## 2016-09-24 MED ORDER — FUROSEMIDE 10 MG/ML IJ SOLN
80.0000 mg | Freq: Two times a day (BID) | INTRAMUSCULAR | Status: DC
  Administered 2016-09-25 – 2016-09-28 (×7): 80 mg via INTRAVENOUS
  Filled 2016-09-24 (×8): qty 8

## 2016-09-24 MED ORDER — INSULIN ASPART 100 UNIT/ML ~~LOC~~ SOLN
6.0000 [IU] | Freq: Three times a day (TID) | SUBCUTANEOUS | Status: DC
  Administered 2016-09-25: 6 [IU] via SUBCUTANEOUS

## 2016-09-24 MED ORDER — NYSTATIN 100000 UNIT/GM EX POWD
Freq: Three times a day (TID) | CUTANEOUS | Status: DC
  Administered 2016-09-24 – 2016-09-30 (×16): via TOPICAL
  Filled 2016-09-24: qty 15

## 2016-09-24 MED ORDER — SACUBITRIL-VALSARTAN 49-51 MG PO TABS
1.0000 | ORAL_TABLET | Freq: Two times a day (BID) | ORAL | Status: DC
  Administered 2016-09-24 – 2016-09-29 (×11): 1 via ORAL
  Filled 2016-09-24 (×14): qty 1

## 2016-09-24 MED ORDER — SODIUM CHLORIDE 0.9% FLUSH: 3.0000 mL | INTRAVENOUS | Status: DC | PRN

## 2016-09-24 MED ORDER — HEPARIN SODIUM (PORCINE) 5000 UNIT/ML IJ SOLN
5000.0000 [IU] | Freq: Three times a day (TID) | INTRAMUSCULAR | Status: DC
  Administered 2016-09-24 – 2016-09-30 (×17): 5000 [IU] via SUBCUTANEOUS
  Filled 2016-09-24 (×12): qty 1

## 2016-09-24 NOTE — Progress Notes (Signed)
  Echocardiogram 2D Echocardiogram has been performed.  Chelsea L Androw 09/24/2016, 4:18 PM

## 2016-09-24 NOTE — ED Provider Notes (Signed)
Medical screening examination/treatment/procedure(s) were conducted as a shared visit with non-physician practitioner(s) and myself.  I personally evaluated the patient during the encounter.  61 year old male with multiple comorbidities to include diabetes, hyper cholesterolemia, CHF, hypertension presents emergency department today with swollen scrotum. States this is progressively worsening with erythema and tenderness as well. He has erythema on his inner thighs directly opposing the scrotum. On my exam is current on the size of a small basketball and significant edema around his penis as well. He is able to urinate. It is tender and warm and red. Suspect CHF exacerbation with edema versus cellulitis. Had initially planned to start treatment with antibodies but after consultation with the internal medicine team will hold on antibiotics and just treat for CHF for now. Plan for admission.     Merrily Pew, MD 09/25/16 [phone]

## 2016-09-24 NOTE — ED Notes (Signed)
ED Provider at bedside. Along with Admitting MD at bedside.

## 2016-09-24 NOTE — ED Notes (Signed)
Bed: WA21 Expected date:  Expected time:  Means of arrival:  Comments: EMS-scrotal swelling

## 2016-09-24 NOTE — H&P (Signed)
History and Physical    [name] CBJ:[id number] DOB: 09-29-1955 DOA: 09/24/2016  Referring MD/NP/PA: Rodell Perna, EDPA PCP: Steve Rattler, DO  Patient coming from: Home  Chief Complaint: Scrotal pain and swelling  HPI: [name] is a 61 y.o. male with multiple medical comorbidities including poorly controlled diabetes, chronic systolic heart failure with known ejection fraction of 15%, peripheral vascular disease status post right BKA with poor healing wound on right BKA stump followed by Dr. Sharol Given, obesity who presents to the hospital today with the above-mentioned complaints. He states that for the past 2 weeks he has noticed increasing scrotal edema and pain. He is mostly wheelchair bound. He has been unable to urinate for at least the past 24 hours, states he only has a little bit of "dribbling". He also has new left lower extremity wounds which are multiple and also started appearing about 2 weeks ago. He denies fever, chills. Admission has been requested.  Past Medical/Surgical History: Past Medical History:  Diagnosis Date  . Acute on chronic systolic heart failure, NYHA class 3 ([place])   . AKI (acute kidney injury) ([place])   . Anemia   . BKA stump complication ([place])   . Blind left eye   . Blind left eye   . Cardiomyopathy, dilated ([place])   . CHF (congestive heart failure) ([place])   . Combined systolic and diastolic congestive heart failure ([place])   . Congestive dilated cardiomyopathy ([place]) 07/11/2015  . Diabetes mellitus   . Diabetic neuropathy associated with type 2 diabetes mellitus ([place])   . Hypertension   . Neuropathy   . Noncompliance with medications   . Open knee wound 10/2015   on rt bka  . PAD (peripheral artery disease) ([place])   . Protein calorie malnutrition ([place])   . Shortness of breath dyspnea     Past Surgical History:  Procedure Laterality Date  . AMPUTATION Right 09/26/2013   Procedure: AMPUTATION BELOW KNEE;  Surgeon: Rozanna Box, MD;  Location:  [facility];  Service: Orthopedics;  Laterality: Right;  . AMPUTATION Right 05/01/2015   Procedure: REVISION OF RIGHT TRANSTIBIAL AMPUTATION ;  Surgeon: Newt Minion, MD;  Location: [facility];  Service: Orthopedics;  Laterality: Right;  . APPLICATION OF WOUND VAC  08/10/2016   Procedure: APPLICATION OFI NCISONAL  WOUND VAC;  Surgeon: Newt Minion, MD;  Location: [place] [facility] [place];  Service: Orthopedics;;  . CARDIAC CATHETERIZATION N/A 07/13/2015   Procedure: Right/Left Heart Cath and Coronary Angiography;  Surgeon: Jolaine Artist, MD;  Location: [facility];  Service: Cardiovascular;  Laterality: N/A;  . COLONOSCOPY    . I&D EXTREMITY Right 09/24/2013   Procedure: IRRIGATION AND DEBRIDEMENT RIGHT FOOT ULCER;  Surgeon: Renette Butters, MD;  Location: [facility];  Service: Orthopedics;  Laterality: Right;  . I&D EXTREMITY Right 09/26/2013   Procedure: Repeat IRRIGATION AND DEBRIDEMENT Right Foot Ulcer;  Surgeon: Rozanna Box, MD;  Location: [facility];  Service: Orthopedics;  Laterality: Right;  . MULTIPLE TOOTH EXTRACTIONS    . SKIN SPLIT GRAFT Right 08/10/2016   Procedure: SKIN GRAFT SPLIT THICKNESS RIGHT LEG;  Surgeon: Newt Minion, MD;  Location: [facility];  Service: Orthopedics;  Laterality: Right;  . STUMP REVISION Right 04/20/2015   Procedure: Revision Right Below Knee Amputation, Apply Wound VAC;  Surgeon: Newt Minion, MD;  Location: [facility];  Service: Orthopedics;  Laterality: Right;  . TONSILLECTOMY      Social History:  reports that he  quit smoking about 16 years ago. He quit after 20.00 years of use. He quit smokeless tobacco use about 16 years ago. He reports that he does not drink alcohol or use drugs.  Allergies: Allergies  Allergen Reactions  . No Known Allergies     Family History:  Family History  Problem Relation Age of Onset  . Cancer Mother   . Peripheral vascular disease Father     Prior to Admission medications   Medication Sig Start Date End Date Taking? Authorizing Provider    atorvastatin (LIPITOR) 40 MG tablet Take 1 tablet (40 mg total) by mouth daily. Patient not taking: Reported on 09/24/2016 01/24/16   Clegg, Amy D, NP  brimonidine (ALPHAGAN) 0.2 % ophthalmic solution PLACE 1 DROP INTO THE LEFT EYE 2 (TWO) TIMES DAILY. Patient not taking: Reported on 09/24/2016 06/15/15   Mariel Aloe, MD  carvedilol (COREG) 12.5 MG tablet Take 1 tablet (12.5 mg total) by mouth 2 (two) times daily with a meal. Patient not taking: Reported on 09/24/2016 02/07/16   Darrick Grinder D, NP  cholecalciferol (VITAMIN D) 1000 units tablet Take 1,000 Units by mouth daily.    [provider]  dorzolamide-timolol (COSOPT) 22.3-6.8 MG/ML ophthalmic solution Place 1 drop into the left eye 2 (two) times daily. Patient not taking: Reported on 09/24/2016 09/28/13   Virginia Crews, MD  furosemide (LASIX) 80 MG tablet Take 80 mg (1 tabs) in am and 40 mg (1/2 tab) in pm 05/24/16   Shirley Friar, PA-C  insulin glargine (LANTUS) 100 UNIT/ML injection Inject 10 Units into the skin at bedtime.     [provider]  metFORMIN (GLUCOPHAGE) 500 MG tablet Take 1 tablet (500 mg total) by mouth 2 (two) times daily with a meal. 10/10/15   Clegg, Amy D, NP  potassium chloride SA (K-DUR,KLOR-CON) 20 MEQ tablet Take 20 mEq by mouth 2 (two) times daily.    [provider]  sacubitril-valsartan (ENTRESTO) 49-51 MG Take 1 tablet by mouth 2 (two) times daily. 08/23/16   Arbutus Leas, NP  silver sulfADIAZINE (SILVADENE) 1 % cream Apply 1 application topically daily. 08/15/16   Suzan Slick, NP  vitamin A 10000 UNIT capsule Take 10,000 Units by mouth daily.    [provider]    Review of Systems:  Constitutional: Denies fever, chills, diaphoresis, appetite change and fatigue.  HEENT: Denies photophobia, eye pain, redness, hearing loss, ear pain, congestion, sore throat, rhinorrhea, sneezing, mouth sores, trouble swallowing, neck pain, neck stiffness and tinnitus.    Respiratory: Denies SOB, DOE, cough, chest tightness,  and wheezing.   Cardiovascular: Denies chest pain, palpitations and leg swelling.  Gastrointestinal: Denies nausea, vomiting, abdominal pain, diarrhea, constipation, blood in stool and abdominal distention.  Genitourinary: Denies dysuria, urgency, frequency, hematuria, flank pain and difficulty urinating.  Endocrine: Denies: hot or cold intolerance, sweats, changes in hair or nails, polyuria, polydipsia. Musculoskeletal: Denies myalgias, back pain, joint swelling, arthralgias and gait problem.  Skin: Denies pallor, rash and wound.  Neurological: Denies dizziness, seizures, syncope, weakness, light-headedness, numbness and headaches.  Hematological: Denies adenopathy. Easy bruising, personal or family bleeding history  Psychiatric/Behavioral: Denies suicidal ideation, mood changes, confusion, nervousness, sleep disturbance and agitation    Physical Exam: Vitals:   09/24/16 1034 09/24/16 1045 09/24/16 1409  BP:  (!) 146/108 (!) 151/133  Pulse:  91 82  Resp:  18 18  Temp:  (!) 97.4 F (36.3 C)   TempSrc:  Oral   SpO2: 93%  94% 94%     Constitutional: NAD, calm, comfortable Eyes: Left eye blindness ENMT: Mucous membranes are moist. Posterior pharynx clear of any exudate or lesions.Normal dentition.  Neck: normal, supple, no masses, no thyromegaly Respiratory: clear to auscultation bilaterally, no wheezing, no crackles. Normal respiratory effort. No accessory muscle use.  Cardiovascular: Regular rate and rhythm, no murmurs / rubs / gallops. 2-3+ left lower extremity edema extremity edema. 2+ pedal pulses. No carotid bruits.  Abdomen: no tenderness, no masses palpated. No hepatosplenomegaly. Bowel sounds positive.  Musculoskeletal: Status post right BKA. Please see pictures for description of wounds.         Genitourinary: Please see pictures for description of scrotal edema:      Skin: no rashes, lesions, ulcers. No  induration Neurologic: CN 2-12 grossly intact. Sensation intact, DTR normal. Strength 5/5 in all 4.  Psychiatric: Normal judgment and insight. Alert and oriented x 3. Normal mood.    Labs on Admission: I have personally reviewed the following labs and imaging studies  CBC:  Recent Labs Lab 09/24/16 1123  WBC 8.3  NEUTROABS 6.8  HGB 12.7*  HCT 38.0*  MCV 77.2*  PLT 440   Basic Metabolic Panel:  Recent Labs Lab 09/24/16 1123  NA 139  K 3.8  CL 106  CO2 25  GLUCOSE 99  BUN 40*  CREATININE 1.09  CALCIUM 8.4*   GFR: CrCl cannot be calculated (Unknown ideal weight.). Liver Function Tests: No results for input(s): AST, ALT, ALKPHOS, BILITOT, PROT, ALBUMIN in the last 168 hours. No results for input(s): LIPASE, AMYLASE in the last 168 hours. No results for input(s): AMMONIA in the last 168 hours. Coagulation Profile: No results for input(s): INR, PROTIME in the last 168 hours. Cardiac Enzymes: No results for input(s): CKTOTAL, CKMB, CKMBINDEX, TROPONINI in the last 168 hours. BNP (last 3 results) No results for input(s): PROBNP in the last 8760 hours. HbA1C: No results for input(s): HGBA1C in the last 72 hours. CBG:  Recent Labs Lab 09/24/16 1049  GLUCAP 98   Lipid Profile: No results for input(s): CHOL, HDL, LDLCALC, TRIG, CHOLHDL, LDLDIRECT in the last 72 hours. Thyroid Function Tests: No results for input(s): TSH, T4TOTAL, FREET4, T3FREE, THYROIDAB in the last 72 hours. Anemia Panel: No results for input(s): VITAMINB12, FOLATE, FERRITIN, TIBC, IRON, RETICCTPCT in the last 72 hours. Urine analysis:    Component Value Date/Time   COLORURINE YELLOW 07/15/2015 2109   APPEARANCEUR CLEAR 07/15/2015 2109   LABSPEC 1.008 07/15/2015 2109   PHURINE 7.5 07/15/2015 2109   GLUCOSEU NEGATIVE 07/15/2015 2109   HGBUR NEGATIVE 07/15/2015 2109   BILIRUBINUR NEGATIVE 07/15/2015 2109   [place] NEGATIVE 07/15/2015 2109   PROTEINUR NEGATIVE 07/15/2015 2109    UROBILINOGEN 0.2 07/17/2014 2259   NITRITE NEGATIVE 07/15/2015 2109   LEUKOCYTESUR NEGATIVE 07/15/2015 2109   Sepsis Labs: @LABRCNTIP (procalcitonin:4,lacticidven:4) )No results found for this or any previous visit (from the past 240 hour(s)).   Radiological Exams on Admission: Dg Chest 1 View  Result Date: 09/24/2016 CLINICAL DATA:  No chest c/o.  Smoker. Htn. Fluid overload EXAM: CHEST 1 VIEW COMPARISON:  10/07/2015 FINDINGS: The heart is enlarged and accentuated by the AP portable position of the patient. The lungs are free of focal consolidations and pleural effusions. No pulmonary edema. IMPRESSION: Stable cardiomegaly. Electronically Signed   By: Nolon Nations M.D.   On: 09/24/2016 14:22    EKG: Independently reviewed. None obtained in the ED  Assessment/Plan Principal Problem:   Acute systolic CHF (congestive heart  failure) ([place]) Active Problems:   DM (diabetes mellitus), type 2, uncontrolled ([place])   Essential hypertension, benign   Diabetic neuropathy associated with type 2 diabetes mellitus ([place])   Blindness of left eye   History of right below knee amputation ([place])   PAD (peripheral artery disease) ([facility])   Scrotal edema   Multiple open wounds of lower leg, initial encounter    Scrotal edema and pain -Suspect this is more related to acute CHF than cellulitis/infection. -Doubt testicular torsion as this has been ongoing for several weeks. -Scrotal ultrasound is pending.  Urinary retention -Suspect secondary to massive scrotal and penile edema. -Have asked RN to place Foley catheter.  Acute on chronic systolic CHF -2-D echo in May 2017: Shows an ejection fraction of 15% with diffuse hypokinesis and high ventricular filling pressures. -Will request repeat echocardiogram. -Continue Coreg, Entresto. -Start Lasix 80 mg IV twice a day. Strive for negative fluid balance. -Strict I's and O's, daily weights, daily basic metabolic profiles. -Chest x-ray and BNP are  requested.  Tinea of the groin -Nystatin powder to be applied 3 times a day.  New left lower extremity wounds -These appear to be vascular in origin. -Will request wound care consultation. -Will also request left ABIs.  Essential hypertension -Fair control, continue home medications.  Type 2 diabetes -Continue home dose of long-acting insulin, place on resistance sliding scale. -Hold metformin while in the hospital.       DVT prophylaxis: Subcutaneous heparin  Code Status: Full Code  Family Communication: Patient only  Disposition Plan: [place] home in 48-72 hours  Consults called: None  Admission status: Inpatient    Time Spent: 90 minutes  Lelon Frohlich MD [facility] Pager [phone]  If 7PM-7AM, please contact night-coverage [email or web address] Password [facility]  09/24/2016, 2:43 PM

## 2016-09-24 NOTE — ED Provider Notes (Signed)
[facility] DEPT Provider Note   CSN: [id number] Arrival date & time: 09/24/16  1028     History   Chief Complaint Chief Complaint  Patient presents with  . Testicle Pain    HPI [name] is a 61 y.o. male with PMHx DM type II, systolic and diastolic CHF, HTN, diabetic neuropathy, right BKA with open wound, blindness of the left eye with chief complaint acute onset, progressively worsening scrotal swelling and pain for 2 weeks. Patient states pain is in the entire scrotum, worsens with palpation, and is intermittently sharp. He denies genital sores, fevers, chills, penile pain or swelling or erythema, or penile discharge. He does endorse decreased urinary stream but denies frequency, urgency, dysuria, or hematuria. He denies abdominal pain, nausea, vomiting, chest pain, or shortness of breath.  Of note, he does have a chronic wound to the right lower extremity proximal to his BKA. This is being evaluated by Dr. Sharol Given, his orthopedist. He does have lesions to the left lower extremity which began one week ago for which Dr. Sharol Given has not seen and evaluated him.   The history is provided by the patient.    Past Medical History:  Diagnosis Date  . Acute on chronic systolic heart failure, NYHA class 3 ([place])   . AKI (acute kidney injury) ([place])   . Anemia   . BKA stump complication ([place])   . Blind left eye   . Blind left eye   . Cardiomyopathy, dilated ([place])   . CHF (congestive heart failure) ([place])   . Combined systolic and diastolic congestive heart failure ([place])   . Congestive dilated cardiomyopathy ([place]) 07/11/2015  . Diabetes mellitus   . Diabetic neuropathy associated with type 2 diabetes mellitus ([place])   . Hypertension   . Neuropathy   . Noncompliance with medications   . Open knee wound 10/2015   on rt bka  . PAD (peripheral artery disease) ([place])   . Protein calorie malnutrition ([place])   . Shortness of breath dyspnea     Patient Active Problem List   Diagnosis Date  Noted  . Acute systolic CHF (congestive heart failure) ([place]) 09/24/2016  . Scrotal edema 09/24/2016  . Multiple open wounds of lower leg, initial encounter 09/24/2016  . Tinea of groin 09/24/2016  . Chronic kidney disease (CKD), stage III (moderate) 06/07/2016  . Chronic combined systolic and diastolic congestive heart failure ([place])   . Protein calorie malnutrition ([place]) 07/12/2015  . Congestive dilated cardiomyopathy ([place]) 07/11/2015  . Pressure ulcer 07/09/2015  . Open knee wound 07/06/2015  . History of right below knee amputation ([place]) 02/22/2015  . PAD (peripheral artery disease) ([place]) 02/22/2015  . Blindness of left eye 01/04/2015  . Complications, amputation stump late ([place]) 08/06/2014  . Diabetic neuropathy associated with type 2 diabetes mellitus ([place]) 12/01/2013  . Noncompliance with medications 07/13/2010  . OTHER SPEC TYPES SCHIZOPHRENIA UNSPEC CONDITION 05/24/2009  . Obesity, unspecified 06/09/2007  . DM (diabetes mellitus), type 2, uncontrolled ([place]) 05/02/2006  . Essential hypertension, benign 05/02/2006    Past Surgical History:  Procedure Laterality Date  . AMPUTATION Right 09/26/2013   Procedure: AMPUTATION BELOW KNEE;  Surgeon: Rozanna Box, MD;  Location: [facility];  Service: Orthopedics;  Laterality: Right;  . AMPUTATION Right 05/01/2015   Procedure: REVISION OF RIGHT TRANSTIBIAL AMPUTATION ;  Surgeon: Newt Minion, MD;  Location: [facility];  Service: Orthopedics;  Laterality: Right;  . APPLICATION OF WOUND VAC  08/10/2016   Procedure: APPLICATION OFI NCISONAL  WOUND VAC;  Surgeon: Newt Minion, MD;  Location: [facility];  Service: Orthopedics;;  . CARDIAC CATHETERIZATION N/A 07/13/2015   Procedure: Right/Left Heart Cath and Coronary Angiography;  Surgeon: Jolaine Artist, MD;  Location: [facility];  Service: Cardiovascular;  Laterality: N/A;  . COLONOSCOPY    . I&D EXTREMITY Right 09/24/2013   Procedure: IRRIGATION AND DEBRIDEMENT RIGHT FOOT ULCER;  Surgeon:  Renette Butters, MD;  Location: [facility];  Service: Orthopedics;  Laterality: Right;  . I&D EXTREMITY Right 09/26/2013   Procedure: Repeat IRRIGATION AND DEBRIDEMENT Right Foot Ulcer;  Surgeon: Rozanna Box, MD;  Location: [facility];  Service: Orthopedics;  Laterality: Right;  . MULTIPLE TOOTH EXTRACTIONS    . SKIN SPLIT GRAFT Right 08/10/2016   Procedure: SKIN GRAFT SPLIT THICKNESS RIGHT LEG;  Surgeon: Newt Minion, MD;  Location: [facility];  Service: Orthopedics;  Laterality: Right;  . STUMP REVISION Right 04/20/2015   Procedure: Revision Right Below Knee Amputation, Apply Wound VAC;  Surgeon: Newt Minion, MD;  Location: [facility];  Service: Orthopedics;  Laterality: Right;  . TONSILLECTOMY         Home Medications    Prior to Admission medications   Medication Sig Start Date End Date Taking? Authorizing Provider  atorvastatin (LIPITOR) 40 MG tablet Take 1 tablet (40 mg total) by mouth daily. Patient not taking: Reported on 09/24/2016 01/24/16   Clegg, Amy D, NP  brimonidine (ALPHAGAN) 0.2 % ophthalmic solution PLACE 1 DROP INTO THE LEFT EYE 2 (TWO) TIMES DAILY. Patient not taking: Reported on 09/24/2016 06/15/15   Mariel Aloe, MD  carvedilol (COREG) 12.5 MG tablet Take 1 tablet (12.5 mg total) by mouth 2 (two) times daily with a meal. Patient not taking: Reported on 09/24/2016 02/07/16   Darrick Grinder D, NP  cholecalciferol (VITAMIN D) 1000 units tablet Take 1,000 Units by mouth daily.    [provider]  dorzolamide-timolol (COSOPT) 22.3-6.8 MG/ML ophthalmic solution Place 1 drop into the left eye 2 (two) times daily. Patient not taking: Reported on 09/24/2016 09/28/13   Virginia Crews, MD  furosemide (LASIX) 80 MG tablet Take 80 mg (1 tabs) in am and 40 mg (1/2 tab) in pm 05/24/16   Shirley Friar, PA-C  insulin glargine (LANTUS) 100 UNIT/ML injection Inject 10 Units into the skin at bedtime.     [provider]  metFORMIN (GLUCOPHAGE) 500 MG tablet Take 1 tablet (500  mg total) by mouth 2 (two) times daily with a meal. 10/10/15   Clegg, Amy D, NP  potassium chloride SA (K-DUR,KLOR-CON) 20 MEQ tablet Take 20 mEq by mouth 2 (two) times daily.    [provider]  sacubitril-valsartan (ENTRESTO) 49-51 MG Take 1 tablet by mouth 2 (two) times daily. 08/23/16   Arbutus Leas, NP  silver sulfADIAZINE (SILVADENE) 1 % cream Apply 1 application topically daily. 08/15/16   Suzan Slick, NP  vitamin A 10000 UNIT capsule Take 10,000 Units by mouth daily.    [provider]    Family History Family History  Problem Relation Age of Onset  . Cancer Mother   . Peripheral vascular disease Father     Social History Social History  Substance Use Topics  . Smoking status: Former Smoker    Years: 20.00    Quit date: 05/10/2000  . Smokeless tobacco: Former Systems developer    Quit date: 02/22/2000  . Alcohol use No     Allergies   No  known allergies   Review of Systems Review of Systems  Constitutional: Negative for chills and fever.  Respiratory: Negative for shortness of breath.   Cardiovascular: Negative for chest pain.  Gastrointestinal: Negative for abdominal pain, nausea and vomiting.  Genitourinary: Positive for difficulty urinating, scrotal swelling and testicular pain. Negative for discharge, hematuria and penile pain.  Skin: Positive for wound.  Allergic/Immunologic: Positive for immunocompromised state.  All other systems reviewed and are negative.    Physical Exam Updated Vital Signs BP (!) 151/133 (BP Location: Right Arm)   Pulse 82   Temp (!) 97.4 F (36.3 C) (Oral)   Resp 18   SpO2 94%   Physical Exam  Constitutional: No distress.  Resting comfortably in bed, appears older than stated age  HENT:  Head: Normocephalic and atraumatic.  Eyes: Right eye exhibits no discharge. Left eye exhibits no discharge.  Left eye with hazy cornea, this is chronic and unchanged per patient  Neck: Normal range of motion. Neck supple. No JVD  present. No tracheal deviation present.  Cardiovascular: Normal rate, regular rhythm and normal heart sounds.   2+ pitting edema in the left lower extremity, 2+ DP/PT pulses in the LLE  Pulmonary/Chest: Effort normal and breath sounds normal.  Abdominal: Soft. Bowel sounds are normal. He exhibits no distension. There is no tenderness.  Genitourinary:  Genitourinary Comments: Diffuse edema of the scrotum and penis. Unable to find the urethral meatus. Generalized tenderness to palpation of the scrotum and penis. Left groin with erythema and malodorous discharge. No fluctuance noted.  Musculoskeletal:  Right BKA with annular 6x7cm wound with granulation tissue centrally, nontender and not draining. No surrounding erythema. There are multiple superficial weeping ulcerations and blisters to the left lower extremity between the knee and ankle  Neurological: He is alert.  Skin: Skin is warm and dry. He is not diaphoretic.  Psychiatric: He has a normal mood and affect. His behavior is normal.  Nursing note and vitals reviewed.    ED Treatments / Results  Labs (all labs ordered are listed, but only abnormal results are displayed) Labs Reviewed  BASIC METABOLIC PANEL - Abnormal; Notable for the following:       Result Value   BUN 40 (*)    Calcium 8.4 (*)    All other components within normal limits  CBC WITH DIFFERENTIAL/PLATELET - Abnormal; Notable for the following:    Hemoglobin 12.7 (*)    HCT 38.0 (*)    MCV 77.2 (*)    MCH 25.8 (*)    RDW 19.3 (*)    All other components within normal limits  URINALYSIS, ROUTINE W REFLEX MICROSCOPIC  BRAIN NATRIURETIC PEPTIDE  CBG MONITORING, ED  I-STAT CG4 LACTIC ACID, ED    EKG  EKG Interpretation None       Radiology Dg Chest 1 View  Result Date: 09/24/2016 CLINICAL DATA:  No chest c/o.  Smoker. Htn. Fluid overload EXAM: CHEST 1 VIEW COMPARISON:  10/07/2015 FINDINGS: The heart is enlarged and accentuated by the AP portable position of  the patient. The lungs are free of focal consolidations and pleural effusions. No pulmonary edema. IMPRESSION: Stable cardiomegaly. Electronically Signed   By: Nolon Nations M.D.   On: 09/24/2016 14:22    Procedures Procedures (including critical care time)  Medications Ordered in ED Medications  loperamide (IMODIUM) capsule 4 mg (not administered)  nystatin (MYCOSTATIN/NYSTOP) topical powder (not administered)     Initial Impression / Assessment and Plan / ED Course  I have  reviewed the triage vital signs and the nursing notes.  Pertinent labs & imaging results that were available during my care of the patient were reviewed by me and considered in my medical decision making (see chart for details).     Patient with poorly controlled diabetes and multiple comorbidities with complaint of progressively worsening scrotal/penile swelling. No leukocytosis, normal lactate. Concern for cellulitis versus dependent edema and volume overload. New-onset weeping lesions to the left lower extremity concerning for edema. Low suspicion of testicular torsion or Fournier's gangrene given symptom duration. Awaiting scrotal ultrasound. Spoke with Dr. Jerilee Hoh hospitalist, who agrees to assume care of patient and will admit for further management.   Final Clinical Impressions(s) / ED Diagnoses   Final diagnoses:  Scrotal swelling    New Prescriptions New Prescriptions   No medications on file     Debroah Baller 09/25/16 1700    Mesner, Corene Cornea, MD 09/25/16 [phone]

## 2016-09-24 NOTE — Progress Notes (Signed)
VASCULAR LAB PRELIMINARY  ARTERIAL  ABI completed: Unable to accurately obtain bilateral ABI's due to right below the knee amputation and open wounds on the left calf. Left dorsalis pedis and posterior tibial waveforms appear triphasic and biphasic respectively. Left TBI of 1.11 is suggestive of arterial flow within normal limits at rest.   RIGHT    LEFT    PRESSURE WAVEFORM  PRESSURE WAVEFORM  BRACHIAL 141 Triphasic BRACHIAL 148 Triphasic  DP BKA  DP  Triphasic  PT BKA  PT  Biphasic  GREAT TOE BKA NA GREAT TOE 164 NA    RIGHT LEFT  ABI       Legrand Como, RVT 09/24/2016, 4:22 PM

## 2016-09-24 NOTE — ED Triage Notes (Signed)
PTAR w/ Scrotal pain and swelling x2 weeks. Urine dark orange. Denies fever.

## 2016-09-25 ENCOUNTER — Telehealth (INDEPENDENT_AMBULATORY_CARE_PROVIDER_SITE_OTHER): Payer: Self-pay | Admitting: Orthopedic Surgery

## 2016-09-25 ENCOUNTER — Inpatient Hospital Stay (HOSPITAL_COMMUNITY): Payer: Medicaid Other

## 2016-09-25 DIAGNOSIS — N5089 Other specified disorders of the male genital organs: Secondary | ICD-10-CM

## 2016-09-25 DIAGNOSIS — B356 Tinea cruris: Secondary | ICD-10-CM

## 2016-09-25 LAB — GLUCOSE, CAPILLARY
Glucose-Capillary: 35 mg/dL — CL (ref 65–99)
Glucose-Capillary: 48 mg/dL — ABNORMAL LOW (ref 65–99)
Glucose-Capillary: 104 mg/dL — ABNORMAL HIGH (ref 65–99)
Glucose-Capillary: 108 mg/dL — ABNORMAL HIGH (ref 65–99)
Glucose-Capillary: 152 mg/dL — ABNORMAL HIGH (ref 65–99)

## 2016-09-25 LAB — BASIC METABOLIC PANEL
Anion gap: 6 (ref 5–15)
BUN: 32 mg/dL — ABNORMAL HIGH (ref 6–20)
CO2: 30 mmol/L (ref 22–32)
Calcium: 8.4 mg/dL — ABNORMAL LOW (ref 8.9–10.3)
Chloride: 106 mmol/L (ref 101–111)
Creatinine, Ser: 1.28 mg/dL — ABNORMAL HIGH (ref 0.61–1.24)
GFR calc Af Amer: >60 mL/min (ref >60)
GFR calc non Af Amer: 60 mL/min — ABNORMAL LOW (ref >60)
Glucose, Bld: 54 mg/dL — ABNORMAL LOW (ref 65–99)
Potassium: 4.1 mmol/L (ref 3.5–5.1)
Sodium: 142 mmol/L (ref 135–145)

## 2016-09-25 LAB — HIV ANTIBODY (ROUTINE TESTING W REFLEX): HIV Screen 4th Generation wRfx: NONREACTIVE

## 2016-09-25 LAB — CBC WITH DIFFERENTIAL/PLATELET
Basophils Absolute: 0 10*3/uL (ref 0.0–0.1)
Basophils Relative: 0 %
Eosinophils Absolute: 0 10*3/uL (ref 0.0–0.7)
Eosinophils Relative: 0 %
HCT: 38.8 % — ABNORMAL LOW (ref 39.0–52.0)
Hemoglobin: 12.8 g/dL — ABNORMAL LOW (ref 13.0–17.0)
Lymphocytes Relative: 7 %
Lymphs Abs: 0.6 10*3/uL — ABNORMAL LOW (ref 0.7–4.0)
MCH: 25.3 pg — ABNORMAL LOW (ref 26.0–34.0)
MCHC: 33 g/dL (ref 30.0–36.0)
MCV: 76.8 fL — ABNORMAL LOW (ref 78.0–100.0)
Monocytes Absolute: 1.1 10*3/uL — ABNORMAL HIGH (ref 0.1–1.0)
Monocytes Relative: 12 %
Neutro Abs: 7 10*3/uL (ref 1.7–7.7)
Neutrophils Relative %: 81 %
Platelets: 314 10*3/uL (ref 150–400)
RBC: 5.05 MIL/uL (ref 4.22–5.81)
RDW: 19.4 % — ABNORMAL HIGH (ref 11.5–15.5)
WBC: 8.7 10*3/uL (ref 4.0–10.5)

## 2016-09-25 LAB — HEMOGLOBIN A1C
Hgb A1c MFr Bld: 7.9 % — ABNORMAL HIGH (ref 4.8–5.6)
Mean Plasma Glucose: 180 mg/dL

## 2016-09-25 MED ORDER — INSULIN ASPART 100 UNIT/ML ~~LOC~~ SOLN
0.0000 [IU] | Freq: Three times a day (TID) | SUBCUTANEOUS | Status: DC
  Administered 2016-09-28 – 2016-09-29 (×3): 2 [IU] via SUBCUTANEOUS

## 2016-09-25 MED ORDER — DOXYCYCLINE HYCLATE 100 MG PO TABS
100.0000 mg | ORAL_TABLET | Freq: Two times a day (BID) | ORAL | Status: DC
  Administered 2016-09-25 – 2016-09-29 (×10): 100 mg via ORAL
  Filled 2016-09-25 (×10): qty 1

## 2016-09-25 NOTE — Telephone Encounter (Signed)
FYI- PT WANTED TO INFORM THAT HE IS CURRENLY  HOSPITALIZED.

## 2016-09-25 NOTE — Progress Notes (Addendum)
PROGRESS NOTE    [name]  DVV:[id number] DOB: April 22, 1955 DOA: 09/24/2016 PCP: Steve Rattler, DO  Brief Narrative:  [name] is a 61 y.o. male with multiple medical comorbidities including poorly controlled diabetes, chronic systolic heart failure with hx of ejection fraction of 15% (Now 20-25%) peripheral vascular disease status post right BKA with poor healing wound on right BKA stump followed by Dr. Sharol Given, obesity who presents to the hospital today with scrotal pain and swelling. He states that for the past 2 weeks he has noticed increasing scrotal edema and pain. He is mostly wheelchair bound. He has been unable to urinate for at least the past 24 hours, states he only has a little bit of "dribbling". He also has new left lower extremity wounds which are multiple and also started appearing about 2 weeks ago. He denies fever, chills. He was admitted for an Acute Decompensation of Systolic CHF and WOC was consulted for bilateral lower extremity wounds.   Assessment & Plan:   Principal Problem:   Acute systolic CHF (congestive heart failure) (HCC) Active Problems:   DM (diabetes mellitus), type 2, uncontrolled ([place])   Essential hypertension, benign   Diabetic neuropathy associated with type 2 diabetes mellitus ([place])   Blindness of left eye   History of right below knee amputation ([place])   PAD (peripheral artery disease) (HCC)   Chronic kidney disease (CKD), stage III (moderate)   Scrotal edema   Multiple open wounds of lower leg, initial encounter   Tinea of groin  Acute Decompensation of Chronic Systolic CHF with EF of 62-69% but has a HX of Combined CHF -2-D echo in May 2017: Shows an ejection fraction of 15% with diffuse hypokinesis and high ventricular filling pressures. -Repeat ECHOCardiogram showed the estimated ejection fraction was in the   range of 20% to 25%. Diffuse hypokinesis. The study is not   technically sufficient to allow evaluation of LV diastolic  function. -Continue Carvedilol 12.5 mg po BID and Sacubitril-Valsartan 49-51 po BID and with Atorvastatin 40 mg po Daily  -C/w Lasix 80 mg IV twice a day. Strive for negative fluid balance. -Strict I's and O's, Daily weights, Fluid Restrict to 1500 mL -Patient is -1.720 and Weight is down 6 lbs -BNP was 3,276.9; Repeat BMP in AM -CXR showed The lungs are adequately inflated. There is no focal infiltrate. There is no pleural effusion. The cardiac silhouette remains mildly enlarged. The pulmonary vascularity is not engorged. There is mild tortuosity of the ascending and descending thoracic aorta. The trachea is midline. The bony thorax exhibits no acute abnormality. -Continue to Monitor Volume Status and repeat CMP in AM  Scrotal Edema and pain -Suspect this is more related to acute CHF than cellulitis/infection. -Doubt testicular torsion as this has been ongoing for several weeks. -Scrotal ultrasound showed normal appearance of the testes, large amount of scrotal edema, left greater than right with skin thickening and moderate bilateral hydroceles.  -Scrotal Support  Acute Urinary Retention -Suspect secondary to massive scrotal and penile edema. -Foley Catheter placed for Strict I's and O's  Tinea of the Groin -Nystatin powder to be applied 3 times a day.  New left lower Extremity Wounds and Lower Right Stump Wound -These appear to be vascular in origin. -WOC Nurse Consulted  -ABI's showed Unable to accurately obtain bilateral ABI's due to right below the knee amputation and open wounds on the left calf. Left dorsalis pedis and posterior tibial waveforms appear triphasic and biphasic respectively. Left TBI  of 1.11 is suggestive of arterial flow within normal limits at rest -Empirically started po Doxycycline; Low Threshold to stop as patient has no Leukocytosis (8.7); Temperature was 99.4 -WOC Nurse recommends: The first priority is to clean wounds and restore moisture balance; they  are heavy with exudate overload, drainage is with a musty odor consistent with wound care of inadequate frequency. Patient states he has had no topical wound care in a few days. I have provided Nursing with guidance via the orders for twice daily saline dressings for three (3) days starting today. Following that (beginningThursday), patient will have wound care provided daily using a silver hydrofiber (Aquacel Ag+) which will address exudate, support autolytic debridement and donate an antimicrobial property. Recommend HHRN for continued care and monitoring post discharge at home in addition to continuation of follow up by Dr. Sharol Given in his office at prescribed intervals.  Essential Hypertension -BP at Goal -C/w Carvediolol 12.5 mg po BID, Sacubitril-Valsatran 49-51 mg po BID, and IV Furosemide 80 mg BID -If Necessary add IV Hydralazine   Diabetes Mellitus Type 2 and associated Diabetic Neuropathy  -Hold Home Metformin  -C/w Lantus 10 units sq qHS;  -Decreased C/w Resistant Novolog SSI AC/HS to Moderate Novolog SSI AC -D/C'd Novolog 6 units sq TIDwm -CBG's ranging from 35-104; Continue to Monitor CBG's -HbA1c was 7.9 -C/w Heart Healthy/Carb Modified Diet   Hx of Renal Insuffiencey/CKD Stage 3 -Review of patient's Prior Cr shows baseline appears to be 1.1-1.3 -Patient's BUN/Cr went from 40/1.09 -> 32/1.28 -Continue to Monitor Renal Function Closely as patient is being diuresed -Repeat CMP in AM   DVT prophylaxis: Heparin 5,000 units sq q8h Code Status: FULL CODE Family Communication: No family present at bedside Disposition Plan: Remain Inpatient for continued Diuresis.   Consultants:   [facility] Nurse   Procedures:  ECHOCARDIOGRAM  Study Conclusions  - Left ventricle: The cavity size was normal. Wall thickness was   increased in a pattern of mild LVH. Systolic function was   severely reduced. The estimated ejection fraction was in the   range of 20% to 25%. Diffuse hypokinesis. The  study is not   technically sufficient to allow evaluation of LV diastolic   function. - Aorta: Mildly dilated. Aortic root dimension: 39 mm (ED). - Mitral valve: Mildly thickened leaflets . There was mild   regurgitation. - Right atrium: Moderately dilated. - Tricuspid valve: There was moderate to severe regurgitation. - Pulmonary arteries: PA peak pressure: 63 mm Hg (S). - Inferior vena cava: The vessel was dilated. The respirophasic   diameter changes were blunted (< 50%), consistent with elevated   central venous pressure.  Impressions:  - Compared to a prior study in 2017, the LVEF is slightly higher at   20-25%.   Antimicrobials:  Anti-infectives    Start     Dose/Rate Route Frequency Ordered Stop   09/25/16 1530  doxycycline (VIBRA-TABS) tablet 100 mg     100 mg Oral Every 12 hours 09/25/16 1506     09/24/16 1330  vancomycin (VANCOCIN) IVPB 1000 mg/200 mL premix  Status:  Discontinued     1,000 mg 200 mL/hr over 60 Minutes Intravenous  Once 09/24/16 1324 09/24/16 1407   09/24/16 1330  piperacillin-tazobactam (ZOSYN) IVPB 3.375 g  Status:  Discontinued     3.375 g 100 mL/hr over 30 Minutes Intravenous  Once 09/24/16 1324 09/24/16 1407     Subjective: Seen and examined and stated scrotum was swollen and tender. States he has not been  compliant with Fluid or Salt restriction. No nausea or vomiting. Denied any CP or SOB. No other concerns or complaints at this time.   Objective: Vitals:   09/24/16 1500 09/24/16 2110 09/25/16 0457 09/25/16 1317  BP: (!) 140/92 (!) 138/97 125/73 127/86  Pulse: 72 75 85 60  Resp: 16 18 18 18   Temp: 98.2 F (36.8 C) 98.9 F (37.2 C) 98.3 F (36.8 C) 99.4 F (37.4 C)  TempSrc: Oral Oral Oral Oral  SpO2: 94% 95% 96% 95%  Weight: 104.7 kg (230 lb 13.2 oz)  102 kg (224 lb 13.9 oz)   Height: 6' (1.829 m)       Intake/Output Summary (Last 24 hours) at 09/25/16 1532 Last data filed at 09/25/16 0900  Gross per 24 hour  Intake               480 ml  Output             2200 ml  Net            -1720 ml   Filed Weights   09/24/16 1500 09/25/16 0457  Weight: 104.7 kg (230 lb 13.2 oz) 102 kg (224 lb 13.9 oz)   Examination: Physical Exam:  Constitutional: WN/WD, NAD and appears calm and comfortable Eyes: Lids normal. Patient has Left eye blindness ENMT: External Ears, Nose appear normal. Grossly normal hearing. Mucous membranes are moist.  Neck: Appears normal, supple, no cervical masses, normal ROM, no appreciable thyromegaly, no JVD Respiratory: Diminished to auscultation bilaterally, no wheezing, rales, rhonchi or crackles. Normal respiratory effort and patient is not tachypenic. No accessory muscle use.  Cardiovascular: RRR, no murmurs / rubs / gallops. S1 and S2 auscultated. Patient has bilateral lower extremity wounds and Right BKA with 2+ LE edema Abdomen: Soft, non-tender, non-distended. No masses palpated. No appreciable hepatosplenomegaly. Bowel sounds positive.  GU: Massive Scrotal Swelling and foley catheter in place.  Musculoskeletal: No clubbing / cyanosis of digits/nails. Has Right BKA Skin: Has bilateral lower extremity wounds with purulence. Right BKA stump also has wound.   Neurologic: CN 2-12 grossly intact with no focal deficits.Romberg sign cerebellar reflexes not assessed.  Psychiatric: Normal judgment and insight. Alert and oriented x 3. Normal mood and appropriate affect.   Data Reviewed: I have personally reviewed following labs and imaging studies  CBC:  Recent Labs Lab 09/24/16 1123 09/24/16 1622 09/25/16 0812  WBC 8.3 8.3 8.7  NEUTROABS 6.8  --  7.0  HGB 12.7* 13.3 12.8*  HCT 38.0* 39.5 38.8*  MCV 77.2* 76.6* 76.8*  PLT 312 327 287   Basic Metabolic Panel:  Recent Labs Lab 09/24/16 1123 09/24/16 1622 09/25/16 0354  NA 139  --  142  K 3.8  --  4.1  CL 106  --  106  CO2 25  --  30  GLUCOSE 99  --  54*  BUN 40*  --  32*  CREATININE 1.09 1.24 1.28*  CALCIUM 8.4*  --  8.4*    GFR: Estimated Creatinine Clearance: 76.8 mL/min (A) (by C-G formula based on SCr of 1.28 mg/dL (H)). Liver Function Tests: No results for input(s): AST, ALT, ALKPHOS, BILITOT, PROT, ALBUMIN in the last 168 hours. No results for input(s): LIPASE, AMYLASE in the last 168 hours. No results for input(s): AMMONIA in the last 168 hours. Coagulation Profile: No results for input(s): INR, PROTIME in the last 168 hours. Cardiac Enzymes: No results for input(s): CKTOTAL, CKMB, CKMBINDEX, TROPONINI in the last 168 hours. BNP (last  3 results) No results for input(s): PROBNP in the last 8760 hours. HbA1C:  Recent Labs  09/24/16 1622  HGBA1C 7.9*   CBG:  Recent Labs Lab 09/24/16 1708 09/24/16 2106 09/25/16 0746 09/25/16 0831 09/25/16 1155  GLUCAP 78 76 35* 48* 104*   Lipid Profile: No results for input(s): CHOL, HDL, LDLCALC, TRIG, CHOLHDL, LDLDIRECT in the last 72 hours. Thyroid Function Tests:  Recent Labs  09/24/16 1622  TSH 1.323   Anemia Panel: No results for input(s): VITAMINB12, FOLATE, FERRITIN, TIBC, IRON, RETICCTPCT in the last 72 hours. Sepsis Labs:  Recent Labs Lab 09/24/16 1129  LATICACIDVEN 1.30    No results found for this or any previous visit (from the past 240 hour(s)).   Radiology Studies: Dg Chest 1 View  Result Date: 09/24/2016 CLINICAL DATA:  No chest c/o.  Smoker. Htn. Fluid overload EXAM: CHEST 1 VIEW COMPARISON:  10/07/2015 FINDINGS: The heart is enlarged and accentuated by the AP portable position of the patient. The lungs are free of focal consolidations and pleural effusions. No pulmonary edema. IMPRESSION: Stable cardiomegaly. Electronically Signed   By: Nolon Nations M.D.   On: 09/24/2016 14:22   US Scrotum  Result Date: 09/24/2016 CLINICAL DATA:  Swelling and pain EXAM: ULTRASOUND OF SCROTUM TECHNIQUE: Complete ultrasound examination of the testicles, epididymis, and other scrotal structures was performed. COMPARISON:  07/08/2015  FINDINGS: Right testicle Measurements: 4.3 x 2.7 x 2.4 cm. No mass or microlithiasis visualized. Left testicle Measurements: 3.2 x 3.5 x 2.9 cm. No mass or microlithiasis visualized. Right epididymis:  Normal in size and appearance. Left epididymis:  Normal in size and appearance. Hydrocele:  Moderate bilateral hydroceles. Varicocele: None visualized. Marked scrotal edema and skin thickening, skin thickness on the right measures 1.7 cm and on the left 4.3 cm. No focal fluid collections within the edematous soft tissues of the scrotal sac. IMPRESSION: 1. Normal appearance of the testes 2. Large amount of scrotal edema, left greater than right with skin thickening. 3. Moderate bilateral hydroceles Electronically Signed   By: Donavan Foil M.D.   On: 09/24/2016 19:04   Dg Chest Port 1 View  Result Date: 09/25/2016 CLINICAL DATA:  Follow-up CHF. EXAM: PORTABLE CHEST 1 VIEW COMPARISON:  Chest x-ray of September 24, 2016 FINDINGS: The lungs are adequately inflated. There is no focal infiltrate. There is no pleural effusion. The cardiac silhouette remains mildly enlarged. The pulmonary vascularity is not engorged. There is mild tortuosity of the ascending and descending thoracic aorta. The trachea is midline. The bony thorax exhibits no acute abnormality. IMPRESSION: Stable cardiomegaly.  No acute cardiopulmonary abnormality. Electronically Signed   By: David  Martinique M.D.   On: 09/25/2016 07:09   Scheduled Meds: . atorvastatin  40 mg Oral Daily  . brimonidine  1 drop Left Eye TID  . carvedilol  12.5 mg Oral BID WC  . cholecalciferol  1,000 Units Oral Daily  . dorzolamide-timolol  1 drop Left Eye BID  . doxycycline  100 mg Oral Q12H  . furosemide  80 mg Intravenous Q12H  . heparin  5,000 Units Subcutaneous Q8H  . insulin aspart  0-15 Units Subcutaneous TID WC  . insulin glargine  10 Units Subcutaneous QHS  . nystatin   Topical TID  . potassium chloride SA  20 mEq Oral BID  . sacubitril-valsartan  1 tablet Oral  BID  . sodium chloride flush  3 mL Intravenous Q12H   Continuous Infusions: . sodium chloride       LOS: 1  day   Kerney Elbe, DO [facility] Pager [phone]  If 7PM-7AM, please contact night-coverage [email or web address] Password [facility] 09/25/2016, 3:32 PM

## 2016-09-25 NOTE — Progress Notes (Signed)
Recheck CBG of 45 orange juice given for hypoglycemic. Patient is [name]/ X4

## 2016-09-25 NOTE — Progress Notes (Signed)
Pt was active with [facility], who will not take pt back. Searching for new [facility] agency.

## 2016-09-25 NOTE — Progress Notes (Addendum)
CBG 35 followed hypoglycemic protocol with orange juice, peanut butter and graham crackers. Patient is alert and oriented x4.

## 2016-09-25 NOTE — Consult Note (Signed)
[facility] Nurse wound consult note Reason for Consult: Bilateral LE full thickness wounds.  Followed by Dr. Sharol Given as an outpatient every 2 weeks.  Missed appointment yesterday according to patient.. Uncontrolled DM, right BKA. Wound type: Venous insufficiency Pressure Injury POA: N/A Measurement: Right LE:  6cm x 7.5cm x 1cm 40% red, 60% covered with thin, white non-adherent movable slough. Moderate amount of exudate consistent with autolytically debriding slough. Left LE with numerous ulcerations, full thickness. All are red, moist with small to moderate exudate.  Of note: Left lateral LE with 2cm x 9cm x 0.2cm Left medial LE: 8cm x 4.5cm area with grey central area measuring 3cm x 2cm. Left pretibial wounds in a 9cm x 5cm area, proximal wound measures 2.5 x 3.5cm x 0.2cm, distal wound measures 2cm x 2cm x 0.1cm. Wound bed: As described above Drainage (amount, consistency, odor): As described above, musty odor Periwound: dry, intact. Dressing procedure/placement/frequency: The first priority is to clean wounds and restore moisture balance; they are heavy with exudate overload, drainage is with a musty odor consistent with wound care of inadequate frequency. Patient states he has had no topical wound care in a few days. I have provided Nursing with guidance via the orders for twice daily saline dressings for three (3) days starting today. Following that (beginningThursday), patient will have wound care provided daily using a silver hydrofiber (Aquacel Ag+) which will address exudate, support autolytic debridement and donate an antimicrobial property. Recommend HHRN for continued care and monitoring post discharge at home in addition to continuation of follow up by Dr. Sharol Given in his office at prescribed intervals. If you agree, please order/arrange [facility] with Case Management.  [facility] nursing team will not follow, but will remain available to this patient, the nursing and medical teams.  Please re-consult if  needed. Thanks, Maudie Flakes, MSN, RN, [place], Ar[facility]  Pager# [phone]

## 2016-09-26 DIAGNOSIS — R338 Other retention of urine: Secondary | ICD-10-CM

## 2016-09-26 DIAGNOSIS — I5043 Acute on chronic combined systolic (congestive) and diastolic (congestive) heart failure: Secondary | ICD-10-CM

## 2016-09-26 LAB — GLUCOSE, CAPILLARY
Glucose-Capillary: 47 mg/dL — ABNORMAL LOW (ref 65–99)
Glucose-Capillary: 71 mg/dL (ref 65–99)
Glucose-Capillary: 75 mg/dL (ref 65–99)
Glucose-Capillary: 80 mg/dL (ref 65–99)
Glucose-Capillary: 117 mg/dL — ABNORMAL HIGH (ref 65–99)

## 2016-09-26 LAB — BASIC METABOLIC PANEL
Anion gap: 5 (ref 5–15)
BUN: 32 mg/dL — ABNORMAL HIGH (ref 6–20)
CO2: 30 mmol/L (ref 22–32)
Calcium: 8.3 mg/dL — ABNORMAL LOW (ref 8.9–10.3)
Chloride: 103 mmol/L (ref 101–111)
Creatinine, Ser: 1.29 mg/dL — ABNORMAL HIGH (ref 0.61–1.24)
GFR calc Af Amer: >60 mL/min (ref >60)
GFR calc non Af Amer: 59 mL/min — ABNORMAL LOW (ref >60)
Glucose, Bld: 67 mg/dL (ref 65–99)
Potassium: 3.7 mmol/L (ref 3.5–5.1)
Sodium: 138 mmol/L (ref 135–145)

## 2016-09-26 MED ORDER — INSULIN GLARGINE 100 UNIT/ML ~~LOC~~ SOLN
5.0000 [IU] | Freq: Every day | SUBCUTANEOUS | Status: DC
  Filled 2016-09-26: qty 0.05

## 2016-09-26 NOTE — Telephone Encounter (Signed)
Pictures show that patient is ambulating by crawling on his knees. Patient needs to start Silvadene dressing changes daily, for both lower extremity wounds  and not crawling on his knees. Patient most likely will require discharge to skilled nursing.

## 2016-09-26 NOTE — Progress Notes (Signed)
CBG of 47, hypoglycemic protocol initiated. Repeat CBG of 71. Patient A/O x4, eating breakfast at this time. NAD noted.

## 2016-09-26 NOTE — Progress Notes (Signed)
PROGRESS NOTE  [name] VOZ:[id number] DOB: 12-Dec-1955 DOA: 09/24/2016 PCP: Steve Rattler, DO  Brief Narrative: 61 year old man PMH combined systolic, diastolic congestive heart failure, peripheral vascular disease presenting with scrotal swelling and pain. Admitted for acute CHF with volume overload, urinary retention  Assessment/Plan #1 Acute on chronic systolic, diastolic congestive heart failure with significant volume overload and associated scrotal edema. Scrotal ultrasound showed edema, moderate bilateral hydroceles. -Excellent urine output, -4.7 L since admission. Renal function preserved. Continue aggressive IV diuresis.  #2 Urinary retention. Continue Foley catheter for now given aggressive diuresis. Voiding trial prior to discharge.  #3 Diabetes mellitus type 2 with diabetic neuropathy, peripheral vascular disease status post right BKA, chronic kidney disease stage III. Hemoglobin A1c 7.9. -Hypoglycemia each of the last 2 mornings. Decrease Lantus. Continue to follow.  #4 Left lower extremity wounds, decubitus ulcer right knee, stage II. Management per wound care.  DVT prophylaxis: heparin Code Status: full Family Communication: none Disposition Plan: Home health RN for continued care and monitoring post discharge    Murray Hodgkins, MD  [facility] Direct contact: [phone] --Via amion app OR  --[email or web address]; password [email or web address]  7PM-7AM contact night coverage as above 09/26/2016, 2:15 PM  LOS: 2 days   Consultants:    Procedures:  Echocardiogram Study Conclusions  - Left ventricle: The cavity size was normal. Wall thickness was   increased in a pattern of mild LVH. Systolic function was   severely reduced. The estimated ejection fraction was in the   range of 20% to 25%. Diffuse hypokinesis. The study is not   technically sufficient to allow evaluation of LV diastolic   function. - Aorta: Mildly dilated. Aortic root dimension: 39 mm (ED). -  Mitral valve: Mildly thickened leaflets . There was mild   regurgitation. - Right atrium: Moderately dilated. - Tricuspid valve: There was moderate to severe regurgitation. - Pulmonary arteries: PA peak pressure: 63 mm Hg (S). - Inferior vena cava: The vessel was dilated. The respirophasic   diameter changes were blunted (< 50%), consistent with elevated   central venous pressure.  Impressions:  - Compared to a prior study in 2017, the LVEF is slightly higher at   20-25%.  Antimicrobials:    Interval history/Subjective: Overall feels okay. Eating okay. Besides scrotal edema no other complaints. No significant improvement.  Objective: Vitals: Afebrile, 99.2, 18, 78, 111/85, 98% on room air  Exam:     Constitutional: Appears calm, comfortable  Eyes. Right pupil 2 mm, minimally reactive. Left pupil 4 mm, cloudy, nonreactive. Lids appear unremarkable.  Respiratory. Clear to auscultation bilaterally. No wheezes, rales or rhonchi. Normal respiratory effort.  Cardiovascular. Regular rate and rhythm. No murmur, rub or gallop. 3+ bilateral lower extremity edema, right BKA noted.  GU. Massive edema of the scrotum, nontender.  Psychiatric. Grossly normal mood and affect. Speech fluent and appropriate.   I have personally reviewed the following:  Urine output 3000 -4.7 L since admission Labs:  1 episode of hypoglycemia this morning, 47. 2 episodes of hypoglycemia yesterday morning.  BUN stable, 32. Creatinine stable, 1.29. Potassium within normal limits.  Imaging studies:  Scrotal ultrasound. Normal appearance of the testes. Large amount of scrotal edema. Moderate bilateral hydroceles.  Chest x-ray 7/24, no acute abnormalities  Medical tests:     Test discussed with performing physician:    Decision to obtain old records:    Review and summation of old records:    Scheduled Meds: . atorvastatin  40 mg Oral Daily  .  brimonidine  1 drop Left Eye TID  .  carvedilol  12.5 mg Oral BID WC  . cholecalciferol  1,000 Units Oral Daily  . dorzolamide-timolol  1 drop Left Eye BID  . doxycycline  100 mg Oral Q12H  . furosemide  80 mg Intravenous Q12H  . heparin  5,000 Units Subcutaneous Q8H  . insulin aspart  0-15 Units Subcutaneous TID WC  . insulin glargine  5 Units Subcutaneous QHS  . nystatin   Topical TID  . potassium chloride SA  20 mEq Oral BID  . sacubitril-valsartan  1 tablet Oral BID  . sodium chloride flush  3 mL Intravenous Q12H   Continuous Infusions: . sodium chloride      Principal Problem:   Acute on chronic combined systolic and diastolic CHF (congestive heart failure) (HCC) Active Problems:   DM (diabetes mellitus), type 2, uncontrolled (HCC)   Diabetic neuropathy associated with type 2 diabetes mellitus (HCC)   Blindness of left eye   History of right below knee amputation (HCC)   PAD (peripheral artery disease) (HCC)   Chronic combined systolic and diastolic congestive heart failure (HCC)   Chronic kidney disease (CKD), stage III (moderate)   Scrotal edema   Multiple open wounds of lower leg, initial encounter   Tinea of groin   Acute urinary retention   LOS: 2 days

## 2016-09-27 DIAGNOSIS — I5042 Chronic combined systolic (congestive) and diastolic (congestive) heart failure: Secondary | ICD-10-CM

## 2016-09-27 DIAGNOSIS — N183 Chronic kidney disease, stage 3 (moderate): Secondary | ICD-10-CM

## 2016-09-27 DIAGNOSIS — E1149 Type 2 diabetes mellitus with other diabetic neurological complication: Secondary | ICD-10-CM

## 2016-09-27 DIAGNOSIS — I5021 Acute systolic (congestive) heart failure: Secondary | ICD-10-CM

## 2016-09-27 DIAGNOSIS — I5043 Acute on chronic combined systolic (congestive) and diastolic (congestive) heart failure: Secondary | ICD-10-CM

## 2016-09-27 LAB — BASIC METABOLIC PANEL
Anion gap: 7 (ref 5–15)
BUN: 31 mg/dL — ABNORMAL HIGH (ref 6–20)
CO2: 30 mmol/L (ref 22–32)
Calcium: 8.1 mg/dL — ABNORMAL LOW (ref 8.9–10.3)
Chloride: 99 mmol/L — ABNORMAL LOW (ref 101–111)
Creatinine, Ser: 1.17 mg/dL (ref 0.61–1.24)
GFR calc Af Amer: >60 mL/min (ref >60)
GFR calc non Af Amer: >60 mL/min (ref >60)
Glucose, Bld: 81 mg/dL (ref 65–99)
Potassium: 4 mmol/L (ref 3.5–5.1)
Sodium: 136 mmol/L (ref 135–145)

## 2016-09-27 LAB — GLUCOSE, CAPILLARY
Glucose-Capillary: 86 mg/dL (ref 65–99)
Glucose-Capillary: 97 mg/dL (ref 65–99)
Glucose-Capillary: 97 mg/dL (ref 65–99)
Glucose-Capillary: 141 mg/dL — ABNORMAL HIGH (ref 65–99)

## 2016-09-27 MED ORDER — BISACODYL 5 MG PO TBEC
10.0000 mg | DELAYED_RELEASE_TABLET | Freq: Once | ORAL | Status: AC
  Administered 2016-09-27: 10 mg via ORAL
  Filled 2016-09-27: qty 2

## 2016-09-27 NOTE — Evaluation (Signed)
Occupational Therapy Evaluation Patient Details Name: [name] MRN: [id number] DOB: 07/05/1955 Today's Date: 09/27/2016    History of Present Illness 61 year old man PMH combined systolic, diastolic congestive heart failure, peripheral vascular disease, HTN, DM, peripheral neuropathy, Right BKA, left eye blindness and presented with scrotal swelling and pain. Admitted for acute CHF with volume overload, urinary retention   Clinical Impression   Pt was admitted for the above.  He was having difficulty with adls prior to admission and crawled on hands and knees per chart.  He will benefit from continued OT to increase safety and independence with adls.  Goals are for min A    Follow Up Recommendations  SNF    Equipment Recommendations   (may benefit from drop arm commode)    Recommendations for Other Services       Precautions / Restrictions Precautions Precautions: Fall Precaution Comments: significant scrotal edema Restrictions Other Position/Activity Restrictions: Right BKA, wounds on LEs, per chart review, pt has been crawling - pt reports unknown reason for wounds and states he has been having difficulty with w/c transfers      Mobility Bed Mobility Overal bed mobility: Needs Assistance Bed Mobility: Supine to Sit     Supine to sit: Min assist;HOB elevated     General bed mobility comments: OOB BY PT  Transfers Overall transfer level: Needs assistance Equipment used: None Transfers: Lateral/Scoot Transfers          Lateral/Scoot Transfers: From elevated surface;Min assist;+2 safety/equipment General transfer comment: OOB BY PT    Balance Overall balance assessment: Needs assistance Sitting-balance support: Bilateral upper extremity supported;Feet supported Sitting balance-Leahy Scale: Poor Sitting balance - Comments: difficult to assess as pt frequently performs posterior lean due to scrotal discomfort, able to sit upright briefly without trunk  support                                   ADL either performed or assessed with clinical judgement   ADL Overall ADL's : Needs assistance/impaired Eating/Feeding: Independent   Grooming: Set up;Sitting   Upper Body Bathing: Set up;Sitting   Lower Body Bathing: Moderate assistance;Sitting/lateral leans   Upper Body Dressing : Minimal assistance;Sitting   Lower Body Dressing: Moderate assistance;Sitting/lateral leans                 General ADL Comments: pt seen from chair.  Educated on leaning for ADLs, but he cannot lean far enough to clear hips.  Also educated on rolling while in bed. Pt can bend leg up in sitting to start pants     Vision         Perception     Praxis      Pertinent Vitals/Pain Pain Assessment: Faces Faces Pain Scale: Hurts whole lot Pain Location: scrotum Pain Descriptors / Indicators: Aching Pain Intervention(s): Limited activity within patient's tolerance;Monitored during session;Repositioned     Hand Dominance     Extremity/Trunk Assessment Upper Extremity Assessment Upper Extremity Assessment: Overall WFL for tasks assessed          Communication Communication Communication: No difficulties   Cognition Arousal/Alertness: Awake/alert Behavior During Therapy: WFL for tasks assessed/performed Overall Cognitive Status: Within Functional Limits for tasks assessed                                 General Comments: pt vague with some  answers about PLOF   General Comments       Exercises     Shoulder Instructions      Home Living Family/patient expects to be discharged to:: Private residence Living Arrangements: Alone Available Help at Discharge: Family;Available PRN/intermittently Type of Home: Independent living facility Home Access: Ramped entrance     Home Layout: One level         Bathroom Toilet: Standard     Home Equipment: Walker - 2 wheels;Bedside commode;Wheelchair - manual    Additional Comments: pt states he washes at sink and doesn't use 3:1 over toilet      Prior Functioning/Environment Level of Independence: Independent with assistive device(s)        Comments: pt with hx of pressure ulcers to anterior knees documentated admission of 10/2015, pt reports using w/c for mobility, has not ambulated in about a month        OT Problem List: Decreased strength;Pain;Decreased activity tolerance;Increased edema      OT Treatment/Interventions: Self-care/ADL training;Energy conservation;DME and/or AE instruction;Patient/family education;Balance training    OT Goals(Current goals can be found in the care plan section) Acute Rehab OT Goals Patient Stated Goal: get strength back OT Goal Formulation: With patient Time For Goal Achievement: 10/04/16 Potential to Achieve Goals: Good ADL Goals Pt Will Perform Lower Body Bathing: with min assist;sitting/lateral leans;bed level (vs) Pt Will Perform Lower Body Dressing: with min assist;sitting/lateral leans;bed level (vs) Pt Will Transfer to Toilet: with min assist;bedside commode (lateral transfer to drop arm commode)  OT Frequency: Min 2X/week   Barriers to D/C:            Co-evaluation              AM-PAC PT "6 Clicks" Daily Activity     Outcome Measure Help from another person eating meals?: None Help from another person taking care of personal grooming?: A Little Help from another person toileting, which includes using toliet, bedpan, or urinal?: A Lot Help from another person bathing (including washing, rinsing, drying)?: A Lot Help from another person to put on and taking off regular upper body clothing?: A Little Help from another person to put on and taking off regular lower body clothing?: A Lot 6 Click Score: 16   End of Session    Activity Tolerance: Patient tolerated treatment well Patient left: in chair;with call bell/phone within reach  OT Visit Diagnosis: Muscle weakness  (generalized) (M62.81)                Time: 8270-7867 OT Time Calculation (min): 14 min Charges:  OT General Charges $OT Visit: 1 Procedure OT Evaluation $OT Eval Low Complexity: 1 Procedure G-Codes:     [name]

## 2016-09-27 NOTE — Evaluation (Signed)
Physical Therapy Evaluation Patient Details Name: [name] MRN: [id number] DOB: 1955-11-06 Today's Date: 09/27/2016   History of Present Illness  61 year old man PMH combined systolic, diastolic congestive heart failure, peripheral vascular disease, HTN, DM, peripheral neuropathy, Right BKA, left eye blindness and presented with scrotal swelling and pain. Admitted for acute CHF with volume overload, urinary retention  Clinical Impression  Pt admitted with above diagnosis. Pt currently with functional limitations due to the deficits listed below (see PT Problem List).  Pt will benefit from skilled PT to increase their independence and safety with mobility to allow discharge to the venue listed below.  Pt reports increased difficulty with mobilizing at home stating he last ambulated about a month ago and has been having difficulty with transfers prior to admission.  Pt would benefit from d/c to SNF.     Follow Up Recommendations SNF    Equipment Recommendations  None recommended by PT    Recommendations for Other Services       Precautions / Restrictions Precautions Precautions: Fall Precaution Comments: significant scrotal edema Restrictions Other Position/Activity Restrictions: Right BKA, wounds on LEs, per chart review, pt has been crawling - pt reports unknown reason for wounds and states he has been having difficulty with w/c transfers      Mobility  Bed Mobility Overal bed mobility: Needs Assistance Bed Mobility: Supine to Sit     Supine to sit: Min assist;HOB elevated     General bed mobility comments: pt required assist for trunk, tends to rest trunk posteriorly and prop on elbows, darkened skin near bil elbows likely from pressure  Transfers Overall transfer level: Needs assistance Equipment used: None Transfers: Lateral/Scoot Transfers          Lateral/Scoot Transfers: From elevated surface;Min assist;+2 safety/equipment General transfer comment:  signficant scrotal edema limitng transfers so performed lateral scoot transfer from bed to drop arm recliner for safety, increased posterior lean likely due to scrotal discomfort  Ambulation/Gait                Stairs            Wheelchair Mobility    Modified Rankin (Stroke Patients Only)       Balance Overall balance assessment: Needs assistance Sitting-balance support: Bilateral upper extremity supported;Feet supported Sitting balance-Leahy Scale: Poor Sitting balance - Comments: difficult to assess as pt frequently performs posterior lean due to scrotal discomfort, able to sit upright briefly without trunk support                                     Pertinent Vitals/Pain Pain Assessment: Faces Faces Pain Scale: Hurts whole lot Pain Location: scrotum Pain Descriptors / Indicators: Aching;Tightness Pain Intervention(s): Limited activity within patient's tolerance;Repositioned;Monitored during session    Home Living Family/patient expects to be discharged to:: Private residence Living Arrangements: Alone Available Help at Discharge: Family;Available PRN/intermittently   Home Access: Ramped entrance     Home Layout: One level Home Equipment: Walker - 2 wheels;Bedside commode;Wheelchair - manual      Prior Function Level of Independence: Independent with assistive device(s)         Comments: pt with hx of pressure ulcers to anterior knees documentated admission of 10/2015, pt reports using w/c for mobility, has not ambulated in about a month     Hand Dominance        Extremity/Trunk Assessment  Lower Extremity Assessment Lower Extremity Assessment: Generalized weakness;RLE deficits/detail RLE Deficits / Details: R BKA, residual limb and knee wrapped in dressing RLE Sensation: history of peripheral neuropathy       Communication   Communication: No difficulties  Cognition Arousal/Alertness: Awake/alert Behavior During  Therapy: WFL for tasks assessed/performed Overall Cognitive Status: Within Functional Limits for tasks assessed                                        General Comments      Exercises     Assessment/Plan    PT Assessment Patient needs continued PT services  PT Problem List Decreased strength;Decreased mobility;Decreased knowledge of use of DME;Impaired sensation;Decreased activity tolerance;Decreased balance       PT Treatment Interventions DME instruction;Therapeutic activities;Therapeutic exercise;Functional mobility training;Patient/family education;Wheelchair mobility training;Balance training    PT Goals (Current goals can be found in the Care Plan section)  Acute Rehab PT Goals PT Goal Formulation: With patient Time For Goal Achievement: 10/04/16 Potential to Achieve Goals: Good    Frequency Min 3X/week   Barriers to discharge        Co-evaluation               AM-PAC PT "6 Clicks" Daily Activity  Outcome Measure Difficulty turning over in bed (including adjusting bedclothes, sheets and blankets)?: None Difficulty moving from lying on back to sitting on the side of the bed? : Total Difficulty sitting down on and standing up from a chair with arms (e.g., wheelchair, bedside commode, etc,.)?: Total Help needed moving to and from a bed to chair (including a wheelchair)?: A Lot Help needed walking in hospital room?: Total Help needed climbing 3-5 steps with a railing? : Total 6 Click Score: 10    End of Session   Activity Tolerance: Patient limited by pain Patient left: in chair;with call bell/phone within reach;with chair alarm set Nurse Communication: Mobility status (lateral scoot transfer, wide LE BOS due to scrotal edema) PT Visit Diagnosis: Muscle weakness (generalized) (M62.81);Other abnormalities of gait and mobility (R26.89)    Time: 5681-2751 PT Time Calculation (min) (ACUTE ONLY): 17 min   Charges:   PT Evaluation $PT Eval  Moderate Complexity: 1 Procedure     PT G Codes:        Carmelia Bake, PT, DPT 09/27/2016 Pager: [phone]  York Ram E 09/27/2016, 12:02 PM

## 2016-09-27 NOTE — Progress Notes (Signed)
PROGRESS NOTE  [name] ZOX:[id number] DOB: 1955-06-24 DOA: 09/24/2016 PCP: Steve Rattler, DO  Brief Narrative: 61 year old man PMH combined systolic, diastolic congestive heart failure, peripheral vascular disease presenting with scrotal swelling and pain. Admitted for acute CHF with volume overload, urinary retention  Assessment/Plan Acute on chronic systolic, diastolic congestive heart failure with significant volume overload and associated scrotal edema. Scrotal ultrasound showed edema, moderate bilateral hydroceles. - Continues to respond well to diuresis. SCr improving.  - Continue IV lasix, aiming for dry weight ~216lbs per outpatient records.    Urinary retention: - Continue Foley catheter for now given aggressive diuresis. Voiding trial prior to discharge.  Diabetes mellitus type 2 with diabetic neuropathy, peripheral vascular disease status post right BKA, chronic kidney disease stage III. Hemoglobin A1c 7.9. -At goal without recurrent hypoglycemia, continue decreased Lantus. Continue to follow.  Left lower extremity wounds, decubitus ulcer right knee, stage II: - Management per wound care. - Outpatient follow up with Dr. Sharol Given  DVT prophylaxis: heparin Code Status: full Family Communication: none Disposition Plan: Home health RN for continued care and monitoring post discharge    Vance Gather, MD  [facility] Pager [phone]  --Via amion app OR  --[email or web address]; password [email or web address]  7PM-7AM contact night coverage as above 09/27/2016, 3:08 PM  LOS: 3 days   Consultants:  None  Procedures:  Echocardiogram Study Conclusions  - Left ventricle: The cavity size was normal. Wall thickness was   increased in a pattern of mild LVH. Systolic function was   severely reduced. The estimated ejection fraction was in the   range of 20% to 25%. Diffuse hypokinesis. The study is not   technically sufficient to allow evaluation of LV diastolic   function. -  Aorta: Mildly dilated. Aortic root dimension: 39 mm (ED). - Mitral valve: Mildly thickened leaflets . There was mild   regurgitation. - Right atrium: Moderately dilated. - Tricuspid valve: There was moderate to severe regurgitation. - Pulmonary arteries: PA peak pressure: 63 mm Hg (S). - Inferior vena cava: The vessel was dilated. The respirophasic   diameter changes were blunted (< 50%), consistent with elevated   central venous pressure.  Impressions:  - Compared to a prior study in 2017, the LVEF is slightly higher at   20-25%.  Interval history/Subjective: Scrotal and LE swelling improving slowly. No fevers  Objective: BP 112/79 (BP Location: Left Arm)   Pulse 69   Temp (!) 97.4 F (36.3 C) (Oral)   Resp 18   Ht 6' (1.829 m)   Wt 103 kg (227 lb 1.2 oz)   SpO2 92%   BMI 30.80 kg/m   Gen: Chronically ill-appearing male in no distress Pulm: Clear and nonlabored on room air  CV: RRR, no murmur, no JVD, no edema GI: Soft, NT, ND, +BS  Neuro: Alert and oriented. No focal deficits. Ext: Right BKA, bilateral 3+ LE edema extending to nontender scrotum. Skin: LLE with multiple annular irregular shallow ulcerations without purulence.   Scheduled Meds: . atorvastatin  40 mg Oral Daily  . brimonidine  1 drop Left Eye TID  . carvedilol  12.5 mg Oral BID WC  . cholecalciferol  1,000 Units Oral Daily  . dorzolamide-timolol  1 drop Left Eye BID  . doxycycline  100 mg Oral Q12H  . furosemide  80 mg Intravenous Q12H  . heparin  5,000 Units Subcutaneous Q8H  . insulin aspart  0-15 Units Subcutaneous TID WC  . nystatin   Topical TID  . potassium  chloride SA  20 mEq Oral BID  . sacubitril-valsartan  1 tablet Oral BID  . sodium chloride flush  3 mL Intravenous Q12H   Continuous Infusions: . sodium chloride      Principal Problem:   Acute on chronic combined systolic and diastolic CHF (congestive heart failure) (HCC) Active Problems:   DM (diabetes mellitus), type 2,  uncontrolled (HCC)   Diabetic neuropathy associated with type 2 diabetes mellitus (HCC)   Blindness of left eye   History of right below knee amputation (HCC)   PAD (peripheral artery disease) (HCC)   Chronic combined systolic and diastolic congestive heart failure (HCC)   Chronic kidney disease (CKD), stage III (moderate)   Scrotal edema   Multiple open wounds of lower leg, initial encounter   Tinea of groin   Acute urinary retention   LOS: 3 days

## 2016-09-27 NOTE — Progress Notes (Signed)
Pt may go to SNF. Will continue to follow for dc.

## 2016-09-27 NOTE — Clinical Social Work Note (Signed)
Clinical Social Work Assessment  Patient Details  Name: [name] MRN: [id number] Date of Birth: 08-Nov-1955  Date of referral:  09/27/16               Reason for consult:  Facility Placement, Discharge Planning                Permission sought to share information with:  Case Manager Permission granted to share information::     Name::        Agency::     Relationship::  Delbert Phenix  sister [phone]  Contact Information:     Housing/Transportation Living arrangements for the past 2 months:  [place] of Information:  Patient, Medical Team, Case Manager Patient Interpreter Needed:  None Criminal Activity/Legal Involvement Pertinent to Current Situation/Hospitalization:  No - Comment as needed Significant Relationships:  Other Family Members, Siblings, Community Support Lives with:  Self Do you feel safe going back to the place where you live?  Yes Need for family participation in patient care:  No (Coment)  Care giving concerns:  Patient denies any care concerns at this time. He reports he does live alone and uses a walker or wheelchair for assistance.  Reports his sister Danton Clap will take him to doctor appointments and run errands and he has a home health agency come see him 3x a week.  Patient reports he wants to go back home at discharge vs going into rehab facility.  LCSW spoke with patient's sister Danton Clap who reports patient has not been managing his care consistently at home. She reports he has not been eating or sleeping well at home and some days he is great about taking his medication and others he refuses because he does not like how it makes him feel.  Overall sister provides the most supportive care and understands recommendations and patient refusal.  She reports he would probably do well in facility, but he is not willing to go to SNF nor give up his medicaid check.   Patient is his own guardian, sister does help with decisions of care.   Social  Worker assessment / plan:  Assessment completed regarding consult for placement.  At this time, patient chooses to go home.  He wants to resume services that are already in place. Education provided to patient and sister regarding recommendation and payor source regarding placement if willing. Patient not willing to give up his check at this time.  DC home.  Employment status:  Disabled (Comment on whether or not currently receiving Disability) Insurance information:  [place]icaid [place] PT Recommendations:  [place] / Referral to community resources:  [place]  Patient/Family's Response to care:  Understanding  Patient/Family's Understanding of and Emotional Response to Diagnosis, Current Treatment, and Prognosis:  Patient limited in conversation and was able to answer questions as they were asked in assessment, but guarded about other information/not forth coming.   Emotional Assessment Appearance:  Appears stated age Attitude/Demeanor/Rapport:    Affect (typically observed):  Accepting, Adaptable Orientation:  Oriented to Self, Oriented to Place, Oriented to  Time, Oriented to Situation Alcohol / Substance use:  Not Applicable Psych involvement (Current and /or in the community):  No (Comment)  Discharge Needs  Concerns to be addressed:  Denies Needs/Concerns at this time Readmission within the last 30 days:  No Current discharge risk:  None Barriers to Discharge:  No Barriers Identified   [place], LCSW 09/27/2016, 2:01 PM

## 2016-09-28 DIAGNOSIS — N19 Unspecified kidney failure: Secondary | ICD-10-CM

## 2016-09-28 DIAGNOSIS — I1310 Hypertensive heart and chronic kidney disease without heart failure, with stage 1 through stage 4 chronic kidney disease, or unspecified chronic kidney disease: Secondary | ICD-10-CM

## 2016-09-28 LAB — GLUCOSE, CAPILLARY
Glucose-Capillary: 97 mg/dL (ref 65–99)
Glucose-Capillary: 108 mg/dL — ABNORMAL HIGH (ref 65–99)
Glucose-Capillary: 143 mg/dL — ABNORMAL HIGH (ref 65–99)
Glucose-Capillary: 127 mg/dL — ABNORMAL HIGH (ref 65–99)

## 2016-09-28 LAB — BASIC METABOLIC PANEL
Anion gap: 6 (ref 5–15)
BUN: 32 mg/dL — ABNORMAL HIGH (ref 6–20)
CO2: 32 mmol/L (ref 22–32)
Calcium: 8.2 mg/dL — ABNORMAL LOW (ref 8.9–10.3)
Chloride: 100 mmol/L — ABNORMAL LOW (ref 101–111)
Creatinine, Ser: 1.12 mg/dL (ref 0.61–1.24)
GFR calc Af Amer: >60 mL/min (ref >60)
GFR calc non Af Amer: >60 mL/min (ref >60)
Glucose, Bld: 119 mg/dL — ABNORMAL HIGH (ref 65–99)
Potassium: 4.1 mmol/L (ref 3.5–5.1)
Sodium: 138 mmol/L (ref 135–145)

## 2016-09-28 MED ORDER — FUROSEMIDE 10 MG/ML IJ SOLN
60.0000 mg | Freq: Two times a day (BID) | INTRAMUSCULAR | Status: DC
  Administered 2016-09-28 – 2016-09-29 (×3): 60 mg via INTRAVENOUS
  Filled 2016-09-28 (×3): qty 6

## 2016-09-28 MED ORDER — TAMSULOSIN HCL 0.4 MG PO CAPS
0.4000 mg | ORAL_CAPSULE | Freq: Every day | ORAL | Status: DC
  Administered 2016-09-28 – 2016-09-29 (×2): 0.4 mg via ORAL
  Filled 2016-09-28 (×2): qty 1

## 2016-09-28 NOTE — Progress Notes (Signed)
Occupational Therapy Treatment Patient Details Name: [name] MRN: [id number] DOB: [birth year] Today's Date: 09/28/2016    History of present illness 61 year old man PMH combined systolic, diastolic congestive heart failure, peripheral vascular disease, HTN, DM, peripheral neuropathy, Right BKA, left eye blindness and presented with scrotal swelling and pain. Admitted for acute CHF with volume overload, urinary retention   OT comments  Performed ADL from bed level.  Pt is not careful cross contaminating clothes--see note below.  He is refusing SNF and he will have a lot of difficulty at home  Follow Up Recommendations  SNF;Supervision/Assistance - 24 hour (note: pt is refusing snf)    Equipment Recommendations   (drop arm commode (3:1)) possibly hospital bed   Recommendations for Other Services      Precautions / Restrictions Precautions Precautions: Fall       Mobility Bed Mobility     Rolling: Supervision (with bed rails)            Transfers                      Balance   Sitting-balance support: Bilateral upper extremity supported;Feet supported Sitting balance-Leahy Scale: Poor Sitting balance - Comments: pt uncomfortable due to scrotum edema; decreased balance--min A for safety at times until he got himself positioned more comfortably                                   ADL either performed or assessed with clinical judgement   ADL           Upper Body Bathing: Set up;Supervision/ safety;Bed level   Lower Body Bathing: Supervison/ safety;Set up;Bed level   Upper Body Dressing : Bed level;Minimal assistance;Moderate assistance                     General ADL Comments: Noted pt is refusing SNF.  Educated pt that he needs to complete LB adls from bed level, bringing a couple of washcloths back to bed when he is ready to return to bed. He was using bedrails to roll--he does not have these at home.  Cues for hygiene as pt  was washing other parts of body after his peri area and buttocks.  Pt has wounds and this is a concern.  Pt's RLE wound was exposed--dressing was off. Called for RN to replace, so I didn't assist him OOB this sessionj     Vision   Additional Comments: pt with poor vision.  Had difficulty seeing opening for arm in gown   Perception     Praxis      Cognition Arousal/Alertness: Awake/alert Behavior During Therapy: WFL for tasks assessed/performed Overall Cognitive Status: Within Functional Limits for tasks assessed                                          Exercises     Shoulder Instructions       General Comments      Pertinent Vitals/ Pain       Pain Assessment: Faces Faces Pain Scale: Hurts whole lot Pain Location: scrotum Pain Descriptors / Indicators: Aching Pain Intervention(s): Limited activity within patient's tolerance;Monitored during session;Repositioned  Home Living  Prior Functioning/Environment              Frequency  Min 2X/week        Progress Toward Goals  OT Goals(current goals can now be found in the care plan section)  Progress towards OT goals: Progressing toward goals  Acute Rehab OT Goals Time For Goal Achievement: 10/04/16  Plan      Co-evaluation                 AM-PAC PT "6 Clicks" Daily Activity     Outcome Measure   Help from another person eating meals?: None Help from another person taking care of personal grooming?: A Little Help from another person toileting, which includes using toliet, bedpan, or urinal?: A Lot Help from another person bathing (including washing, rinsing, drying)?: A Little Help from another person to put on and taking off regular upper body clothing?: A Little Help from another person to put on and taking off regular lower body clothing?: A Lot 6 Click Score: 17    End of Session    OT Visit Diagnosis: Muscle  weakness (generalized) (M62.81)   Activity Tolerance Patient tolerated treatment well   Patient Left in bed;with call bell/phone within reach;with bed alarm set   Nurse Communication          Time: 5391-2258 OT Time Calculation (min): 24 min  Charges: OT General Charges $OT Visit: 1 Procedure OT Treatments $Self Care/Home Management : 23-37 mins  Lesle Chris, OTR/L [phone] 09/28/2016   [place] 09/28/2016, 10:07 AM

## 2016-09-28 NOTE — Progress Notes (Signed)
[facility] PROGRESS NOTE    Progress Note  [name]  EHU:[id number] DOB: Jul 08, 1955 DOA: 09/24/2016 PCP: Steve Rattler, DO     Brief Narrative:   [name] is an 61 y.o. male past medical history of combined systolic and diastolic congestive heart failure with peripheral vascular disease presents with scrotal swelling. An acute decompensated heart failure.  Assessment/Plan:   Acute on chronic combined systolic and diastolic CHF (congestive heart failure) (HCC)With acute cardiorenal syndrome: Scrotal ultrasound showed edema and moderate bilateral hydrocele. He's had good diuresis with current dose of IV Lasix. Creatinine continues to improve nicely, close to baseline. Continue strict I's and O's daily weights. Estimated dry weight is around 90 kg back in a year. Today is 100 kg.  Urinary retention: Changed condom Foley catheter. Start flomax  DM (diabetes mellitus), type 2, uncontrolled (HCC)/Diabetic neuropathy associated with type 2 diabetes mellitus ; Last A1c was 7.9. Next Glucose fairly controlled continue long-acting insulin plus sliding scale.  Left lower Extremity wound/decubitus ulcer of the right knee stage II: Continue wound care will need to follow-up with Dr. Sharol Given is an outpatient.   DVT prophylaxis: lovenox Family Communication:none Disposition Plan/Barrier to D/C: unable to determine Code Status:     Code Status Orders        Start     Ordered   09/24/16 1527  Full code  Continuous     09/24/16 1526    Code Status History    Date Active Date Inactive Code Status Order ID Comments User Context   10/06/2015  3:40 PM 10/10/2015  4:56 PM Full Code [id number]  Conrad Mellette, NP Inpatient   07/09/2015  1:15 AM 07/20/2015  7:07 PM Full Code [id number]  Carlyle Dolly, MD ED   04/30/2015  1:39 AM 05/02/2015  8:38 PM Full Code [id number]  Smiley Houseman, MD Inpatient   09/28/2013  5:44 PM 10/03/2013  2:02 PM Full Code [id number]  Cathlyn Parsons, PA-C Inpatient   09/28/2013  5:44 PM 09/28/2013  5:44 PM Full Code [id number]  Cathlyn Parsons, PA-C Inpatient   09/26/2013  2:14 PM 09/28/2013  5:44 PM Full Code [id number]  Jari Pigg, PA-C Inpatient   09/24/2013  5:05 PM 09/26/2013  2:14 PM Full Code [id number]  Renette Butters, MD Inpatient   09/20/2013  1:04 AM 09/24/2013  5:05 PM Full Code [id number]  Leeanne Rio, MD Inpatient        IV Access:    Peripheral IV   Procedures and diagnostic studies:   No results found.   Medical Consultants:    None.  Anti-Infectives:   none  Subjective:    [name]   Objective:    Vitals:   09/27/16 0656 09/27/16 1358 09/27/16 2108 09/28/16 0539  BP: 129/88 112/79 (!) 129/91 (!) 124/96  Pulse: 60 69 (!) 55 60  Resp: 18 18 18 16   Temp: 98 F (36.7 C) (!) 97.4 F (36.3 C) 97.6 F (36.4 C) 97.6 F (36.4 C)  TempSrc: Oral Oral Oral Oral  SpO2: 97% 92% 96% 93%  Weight: 103 kg (227 lb 1.2 oz)   100 kg (220 lb 7.4 oz)  Height:        Intake/Output Summary (Last 24 hours) at 09/28/16 0830 Last data filed at 09/28/16 8466  Gross per 24 hour  Intake             1080 ml  Output  2700 ml  Net            -1620 ml   Filed Weights   09/26/16 0500 09/27/16 0656 09/28/16 0539  Weight: 103 kg (227 lb 1.6 oz) 103 kg (227 lb 1.2 oz) 100 kg (220 lb 7.4 oz)    Exam: General exam: In no acute distress. Respiratory system: Good air movement clear to auscultation. Cardiovascular system: Regular rate and rhythm with positive S1-S2 no JVD. Gastrointestinal system: Abdomen is soft and nontender nondistended. Central nervous system: Recurrent and this was a nonfocal. Extremities: Right knee AKA, 3+ edema, left leg wrapped Skin: Left lower extremity irregular station 2-3 ulcer without purulence Psychiatry: Judgment and insight appeared normal.   Data Reviewed:    Labs: Basic Metabolic Panel:  Recent Labs Lab 09/24/16 1123 09/24/16 1622  09/25/16 0354 09/26/16 0358 09/27/16 0429 09/28/16 0403  NA 139  --  142 138 136 138  K 3.8  --  4.1 3.7 4.0 4.1  CL 106  --  106 103 99* 100*  CO2 25  --  30 30 30  32  GLUCOSE 99  --  54* 67 81 119*  BUN 40*  --  32* 32* 31* 32*  CREATININE 1.09 1.24 1.28* 1.29* 1.17 1.12  CALCIUM 8.4*  --  8.4* 8.3* 8.1* 8.2*   GFR Estimated Creatinine Clearance: 87 mL/min (by C-G formula based on SCr of 1.12 mg/dL). Liver Function Tests: No results for input(s): AST, ALT, ALKPHOS, BILITOT, PROT, ALBUMIN in the last 168 hours. No results for input(s): LIPASE, AMYLASE in the last 168 hours. No results for input(s): AMMONIA in the last 168 hours. Coagulation profile No results for input(s): INR, PROTIME in the last 168 hours.  CBC:  Recent Labs Lab 09/24/16 1123 09/24/16 1622 09/25/16 0812  WBC 8.3 8.3 8.7  NEUTROABS 6.8  --  7.0  HGB 12.7* 13.3 12.8*  HCT 38.0* 39.5 38.8*  MCV 77.2* 76.6* 76.8*  PLT 312 327 314   Cardiac Enzymes: No results for input(s): CKTOTAL, CKMB, CKMBINDEX, TROPONINI in the last 168 hours. BNP (last 3 results) No results for input(s): PROBNP in the last 8760 hours. CBG:  Recent Labs Lab 09/27/16 0730 09/27/16 1154 09/27/16 1654 09/27/16 2110 09/28/16 0746  GLUCAP 86 97 97 141* 97   D-Dimer: No results for input(s): DDIMER in the last 72 hours. Hgb A1c: No results for input(s): HGBA1C in the last 72 hours. Lipid Profile: No results for input(s): CHOL, HDL, LDLCALC, TRIG, CHOLHDL, LDLDIRECT in the last 72 hours. Thyroid function studies: No results for input(s): TSH, T4TOTAL, T3FREE, THYROIDAB in the last 72 hours.  Invalid input(s): FREET3 Anemia work up: No results for input(s): VITAMINB12, FOLATE, FERRITIN, TIBC, IRON, RETICCTPCT in the last 72 hours. Sepsis Labs:  Recent Labs Lab 09/24/16 1123 09/24/16 1129 09/24/16 1622 09/25/16 0812  WBC 8.3  --  8.3 8.7  LATICACIDVEN  --  1.30  --   --    Microbiology Recent Results (from the  past 240 hour(s))  Culture, blood (routine x 2)     Status: None (Preliminary result)   Collection Time: 09/25/16  3:19 PM  Result Value Ref Range Status   Specimen Description LEFT ANTECUBITAL  Final   Special Requests   Final    BOTTLES DRAWN AEROBIC ONLY Blood Culture adequate volume   Culture   Final    NO GROWTH 2 DAYS Performed at [facility], [address] [place], [place] [address]  Report Status PENDING  Incomplete  Culture, blood (routine x 2)     Status: None (Preliminary result)   Collection Time: 09/25/16  3:19 PM  Result Value Ref Range Status   Specimen Description BLOOD RIGHT HAND  Final   Special Requests IN PEDIATRIC BOTTLE Blood Culture adequate volume  Final   Culture   Final    NO GROWTH 2 DAYS Performed at [facility], [address], [place], [place] [address]    Report Status PENDING  Incomplete     Medications:   . atorvastatin  40 mg Oral Daily  . brimonidine  1 drop Left Eye TID  . carvedilol  12.5 mg Oral BID WC  . cholecalciferol  1,000 Units Oral Daily  . dorzolamide-timolol  1 drop Left Eye BID  . doxycycline  100 mg Oral Q12H  . furosemide  80 mg Intravenous Q12H  . heparin  5,000 Units Subcutaneous Q8H  . insulin aspart  0-15 Units Subcutaneous TID WC  . nystatin   Topical TID  . potassium chloride SA  20 mEq Oral BID  . sacubitril-valsartan  1 tablet Oral BID  . sodium chloride flush  3 mL Intravenous Q12H   Continuous Infusions: . sodium chloride        LOS: 4 days   Charlynne Cousins  [facility] Pager [phone]  *Please refer to [email or web address], password [email or web address] to get updated schedule on who will round on this patient, as hospitalists switch teams weekly. If 7PM-7AM, please contact night-coverage at [email or web address], password [email or web address] for any overnight needs.  09/28/2016, 8:30 AM

## 2016-09-28 NOTE — Telephone Encounter (Signed)
Yes I will follow-up in the office. If the hospitalist does call for consultation at this time patient needs Silvadene dressing changes.

## 2016-09-28 NOTE — Progress Notes (Signed)
Pt is aware that [facility] will be the [facility] agency caring for him at discharge.  Will need HHRN/NA orders and face to face please.

## 2016-09-29 DIAGNOSIS — E1165 Type 2 diabetes mellitus with hyperglycemia: Secondary | ICD-10-CM

## 2016-09-29 DIAGNOSIS — I739 Peripheral vascular disease, unspecified: Secondary | ICD-10-CM

## 2016-09-29 DIAGNOSIS — Z89511 Acquired absence of right leg below knee: Secondary | ICD-10-CM

## 2016-09-29 DIAGNOSIS — Z794 Long term (current) use of insulin: Secondary | ICD-10-CM

## 2016-09-29 DIAGNOSIS — E1142 Type 2 diabetes mellitus with diabetic polyneuropathy: Secondary | ICD-10-CM

## 2016-09-29 DIAGNOSIS — R338 Other retention of urine: Secondary | ICD-10-CM

## 2016-09-29 LAB — BASIC METABOLIC PANEL
Anion gap: 7 (ref 5–15)
BUN: 29 mg/dL — ABNORMAL HIGH (ref 6–20)
CO2: 32 mmol/L (ref 22–32)
Calcium: 8.7 mg/dL — ABNORMAL LOW (ref 8.9–10.3)
Chloride: 100 mmol/L — ABNORMAL LOW (ref 101–111)
Creatinine, Ser: 1.04 mg/dL (ref 0.61–1.24)
GFR calc Af Amer: >60 mL/min (ref >60)
GFR calc non Af Amer: >60 mL/min (ref >60)
Glucose, Bld: 103 mg/dL — ABNORMAL HIGH (ref 65–99)
Potassium: 4.4 mmol/L (ref 3.5–5.1)
Sodium: 139 mmol/L (ref 135–145)

## 2016-09-29 LAB — GLUCOSE, CAPILLARY
Glucose-Capillary: 91 mg/dL (ref 65–99)
Glucose-Capillary: 144 mg/dL — ABNORMAL HIGH (ref 65–99)
Glucose-Capillary: 130 mg/dL — ABNORMAL HIGH (ref 65–99)
Glucose-Capillary: 126 mg/dL — ABNORMAL HIGH (ref 65–99)

## 2016-09-29 NOTE — Progress Notes (Signed)
  PROGRESS NOTE  [name] JOA:[id number] DOB: 1955/03/31 DOA: 09/24/2016 PCP: Steve Rattler, DO  Brief Narrative: 61 year old man PMH combined systolic, diastolic congestive heart failure, peripheral vascular disease presenting with scrotal swelling and pain. Admitted for acute CHF with volume overload, urinary retention.   Assessment/Plan Acute on chronic systolic, diastolic congestive heart failure with significant volume overload and associated scrotal edema. Scrotal ultrasound showed edema, moderate bilateral hydroceles. - Continues to respond well to diuresis.  - Daily weights, I/O - Continue IV lasix, creatinine continues to improve. EDW uncertain, possibly 60FU.   Acute urinary retention: - Condom catheter will not stay on due to swelling. Pt voiding, though, so will continue to monitor. Flomax started, consider discontinuing this at follow up.   Diabetes mellitus type 2 with diabetic neuropathy: Last HbA1c 7.9%. - At goal without recurrent hypoglycemia, continue decreased Lantus. Continue to follow.  Stage III CKD: Cr at/improved from baseline - Continue to monitor  Left lower extremity wounds, decubitus ulcer right knee, stage II: - Management per wound care. - Outpatient follow up with Dr. Sharol Given  DVT prophylaxis: Heparin Code Status: full Family Communication: none Disposition Plan: Would do best at SNF, but declining this presently. Would order HH-RN and aide with drop-arm 3-n-1 if DC to home.    Vance Gather, MD  [facility] Pager [phone]  --Via amion app OR  --[email or web address]; password [email or web address]  7PM-7AM contact night coverage as above 09/29/2016, 1:44 PM  LOS: 5 days   Consultants:  None  Procedures:  Echocardiogram - Left ventricle: The cavity size was normal. Wall thickness was   increased in a pattern of mild LVH. Systolic function was   severely reduced. The estimated ejection fraction was in the   range of 20% to 25%. Diffuse hypokinesis.  The study is not   technically sufficient to allow evaluation of LV diastolic   function. - Aorta: Mildly dilated. Aortic root dimension: 39 mm (ED). - Mitral valve: Mildly thickened leaflets . There was mild   regurgitation. - Right atrium: Moderately dilated. - Tricuspid valve: There was moderate to severe regurgitation. - Pulmonary arteries: PA peak pressure: 63 mm Hg (S). - Inferior vena cava: The vessel was dilated. The respirophasic   diameter changes were blunted (< 50%), consistent with elevated   central venous pressure.  Impressions: - Compared to a prior study in 2017, the LVEF is slightly higher at   20-25%.  Interval history/Subjective: Swelling improved "a little." Denies any other complaints including pain or fever.   Objective: BP (!) 143/103 (BP Location: Right Arm)   Pulse 62   Temp 97.7 F (36.5 C) (Oral)   Resp 18   Ht 6' (1.829 m)   Wt 92.4 kg (203 lb 11.3 oz)   SpO2 91%   BMI 27.63 kg/m   Gen: Chronically ill-appearing male in no distress sitting in chair, sleeping Pulm: Clear and nonlabored on room air  CV: RRR, no murmur, no JVD, no edema GI: Soft, NT, ND, +BS  Neuro: Alert and oriented. No focal deficits. Ext: Right BKA, bilateral 2+ LE edema extending to nontender scrotum. Mildly improved from previous exams Skin: LLE with multiple annular irregular shallow ulcerations without purulence. Dressings c/d/i

## 2016-09-29 NOTE — Progress Notes (Signed)
Occupational Therapy Treatment Patient Details Name: [name] MRN: [id number] DOB: 09-10-1955 Today's Date: 09/29/2016    History of present illness 61 year old man PMH combined systolic, diastolic congestive heart failure, peripheral vascular disease, HTN, DM, peripheral neuropathy, Right BKA, left eye blindness and presented with scrotal swelling and pain. Admitted for acute CHF with volume overload, urinary retention   OT comments  Improved mobility; cues for sequence  Follow Up Recommendations  SNF;Supervision/Assistance - 24 hour    Equipment Recommendations   (drop arm 3:1 commode)    Recommendations for Other Services      Precautions / Restrictions Precautions Precautions: Fall       Mobility Bed Mobility         Supine to sit: Supervision     General bed mobility comments: extra time; flat bed, no rails  Transfers                Lateral/Scoot Transfers: Min guard;+2 safety/equipment General transfer comment: to drop arm recliner    Balance                                           ADL either performed or assessed with clinical judgement   ADL                           Toilet Transfer: Min guard;+2 for safety/equipment (to drop arm chair (simulated))             General ADL Comments: practiced transfer to recliner via lateral scoot.+2 safety and cues for sequence.  Reinforced that pt needs to use w/c and not get onto hands and knees due to wounds.  He is still stating that he will go home alone     Vision       Perception     Praxis      Cognition Arousal/Alertness: Awake/alert Behavior During Therapy: WFL for tasks assessed/performed Overall Cognitive Status: Within Functional Limits for tasks assessed                                          Exercises     Shoulder Instructions       General Comments      Pertinent Vitals/ Pain       Pain Assessment: Faces Faces Pain  Scale: Hurts even more Pain Location: scrotum Pain Descriptors / Indicators: Aching Pain Intervention(s): Limited activity within patient's tolerance;Monitored during session  Home Living                                          Prior Functioning/Environment              Frequency  Min 2X/week        Progress Toward Goals  OT Goals(current goals can now be found in the care plan section)  Progress towards OT goals: Progressing toward goals  Acute Rehab OT Goals Time For Goal Achievement: 10/04/16  Plan      Co-evaluation                 AM-PAC PT "6 Clicks" Daily Activity     Outcome Measure  Help from another person eating meals?: None Help from another person taking care of personal grooming?: A Little Help from another person toileting, which includes using toliet, bedpan, or urinal?: A Little Help from another person bathing (including washing, rinsing, drying)?: A Little Help from another person to put on and taking off regular upper body clothing?: A Little Help from another person to put on and taking off regular lower body clothing?: A Lot 6 Click Score: 18    End of Session    OT Visit Diagnosis: Muscle weakness (generalized) (M62.81)   Activity Tolerance Patient tolerated treatment well   Patient Left in chair;with call bell/phone within reach;with chair alarm set   Nurse Communication  (NT stood by)        Time: 4585-9292 OT Time Calculation (min): 15 min  Charges: OT General Charges $OT Visit: 1 Procedure OT Treatments $Therapeutic Activity: 8-22 mins  Lesle Chris, OTR/L [phone] 09/29/2016   [place] 09/29/2016, 11:34 AM

## 2016-09-30 DIAGNOSIS — S81809A Unspecified open wound, unspecified lower leg, initial encounter: Secondary | ICD-10-CM

## 2016-09-30 LAB — CULTURE, BLOOD (ROUTINE X 2)
Culture: NO GROWTH
Culture: NO GROWTH
Special Requests: ADEQUATE
Special Requests: ADEQUATE

## 2016-09-30 LAB — BASIC METABOLIC PANEL
Anion gap: 9 (ref 5–15)
BUN: 30 mg/dL — ABNORMAL HIGH (ref 6–20)
CO2: 31 mmol/L (ref 22–32)
Calcium: 8.7 mg/dL — ABNORMAL LOW (ref 8.9–10.3)
Chloride: 102 mmol/L (ref 101–111)
Creatinine, Ser: 1.12 mg/dL (ref 0.61–1.24)
GFR calc Af Amer: >60 mL/min (ref >60)
GFR calc non Af Amer: >60 mL/min (ref >60)
Glucose, Bld: 62 mg/dL — ABNORMAL LOW (ref 65–99)
Potassium: 4.2 mmol/L (ref 3.5–5.1)
Sodium: 142 mmol/L (ref 135–145)

## 2016-09-30 LAB — GLUCOSE, CAPILLARY
Glucose-Capillary: 61 mg/dL — ABNORMAL LOW (ref 65–99)
Glucose-Capillary: 73 mg/dL (ref 65–99)
Glucose-Capillary: 98 mg/dL (ref 65–99)
Glucose-Capillary: 154 mg/dL — ABNORMAL HIGH (ref 65–99)

## 2016-09-30 MED ORDER — DOXYCYCLINE HYCLATE 100 MG PO TABS: 100.0000 mg | ORAL_TABLET | Freq: Two times a day (BID) | ORAL | 0 refills | Status: DC

## 2016-09-30 MED ORDER — FUROSEMIDE 10 MG/ML IJ SOLN
80.0000 mg | Freq: Two times a day (BID) | INTRAMUSCULAR | Status: DC
  Administered 2016-09-30: 80 mg via INTRAVENOUS
  Filled 2016-09-30: qty 8

## 2016-09-30 MED ORDER — TAMSULOSIN HCL 0.4 MG PO CAPS: 0.4000 mg | ORAL_CAPSULE | Freq: Every day | ORAL | 0 refills | Status: DC

## 2016-09-30 NOTE — Discharge Summary (Signed)
Physician Discharge Summary  [name] TOI:[id number] DOB: 1955/03/31 DOA: 09/24/2016  PCP: Steve Rattler, DO  Admit date: 09/24/2016 Discharge date: 09/30/2016  Admitted From: Home Disposition: Home (declined SNF)   Recommendations for Outpatient Follow-up:  1. Follow up with PCP 8/7. 2. Follow up with heart failure clinic. Discharged on home diuretics, urged to be compliant. DC weight 207lbs.  3. Follow up with Dr. Sharol Given as scheduled. 4. Please obtain BMP/CBC in one week.  Home Health: RN (dressing changes), aide, and CSW (SNF recommended but declined by patient) Equipment/Devices: drop-arm 3-n-1 Discharge Condition: Stable CODE STATUS: Full Diet recommendation: Heart healthy, carbohydrate-limited  Brief/Interim Summary: 61 year old man PMH combined systolic, diastolic congestive heart failure, peripheral vascular disease presenting with scrotal swelling and pain. Admitted for acute CHF with volume overload, urinary retention.   Discharge Diagnoses:  Principal Problem:   Acute on chronic combined systolic and diastolic CHF (congestive heart failure) (HCC) Active Problems:   DM (diabetes mellitus), type 2, uncontrolled (HCC)   Diabetic neuropathy associated with type 2 diabetes mellitus (HCC)   Blindness of left eye   History of right below knee amputation (HCC)   PAD (peripheral artery disease) (HCC)   Chronic combined systolic and diastolic congestive heart failure (HCC)   Chronic kidney disease (CKD), stage III (moderate)   Scrotal edema   Multiple open wounds of lower leg, initial encounter   Tinea of groin   Acute urinary retention   Cardiorenal syndrome with renal failure  Acute on chronic systolic, diastolic congestive heart failure with significant volume overload and associated scrotal edema. Scrotal ultrasound showed only edema, moderate bilateral hydroceles. Exacerbation due to noncompliance, a chronic problem for the patient. Responded briskly to diuresis,  weight trending from 230lbs > 207lbs.  - Creatinine mildly bumped on 7/29 and near previous dry weights (211lbs at HF clinic June 2018), so will restart home diuretic (lasix 80mg  qAM, 40mg  qPM) and potassium as well, entresto 49-51mg  BID, coreg 12.5mg  BID, and have follow up in 1 - 2 weeks.  - Continue paramedicine  Acute urinary retention: - Resolved.  - Flomax started, consider discontinuing this at follow up.   Diabetes mellitus type 2 with diabetic neuropathy: Last HbA1c 7.9%. - At goal without recurrent hypoglycemia while inpatient.  - Restart home medications at discharge: Metformin and lantus 10u qHS.   Stage III CKD: Cr at/improved from baseline.  - Continue to monitor  Left lower extremity wounds, decubitus ulcer right knee, stage II: - Management per wound care with silvadene dressing changes. - Outpatient follow up with Dr. Sharol Given - Complete 10-day course of doxycycline for purulent infection noted at admission.   Discharge Instructions Discharge Instructions    (HEART FAILURE PATIENTS) Call MD:  Anytime you have any of the following symptoms: 1) 3 pound weight gain in 24 hours or 5 pounds in 1 week 2) shortness of breath, with or without a dry hacking cough 3) swelling in the hands, feet or stomach 4) if you have to sleep on extra pillows at night in order to breathe.    Complete by:  As directed    Diet - low sodium heart healthy    Complete by:  As directed    Discharge instructions    Complete by:  As directed    You were admitted for volume overload due to not taking enough diuretics. You have improved significantly (over 20 pounds of fluid off!) with diuresis and may be discharged with the following recommendations:  - It  is recommended that you go to a skilled nursing facility to get stronger and close care, though you have declined this and will go home at discharge. If this situation does not work very well, a [facility] representative has been arranged to follow with  you in case you need to go to the facility.  - You will also receive home health aid and nursing for wound care assistance.  - Take medications as listed below. There were no changes to lasix, you just need to take this.  - Start taking flomax daily to help with urinary retention.  - Start taking doxycycline twice daily to treat the wound infection on the left lower leg. You will only need to take this for 4 more days.  - Follow up with your primary doctor as scheduled.  - Call the heart failure clinic to schedule a follow up appointment with them.  - If you start gaining weight again, start swelling again, or get short of breath, seek medical attention right away.     Allergies as of 09/30/2016      Reactions   No Known Allergies       Medication List    TAKE these medications   atorvastatin 40 MG tablet Commonly known as:  LIPITOR Take 1 tablet (40 mg total) by mouth daily.   brimonidine 0.2 % ophthalmic solution Commonly known as:  ALPHAGAN PLACE 1 DROP INTO THE LEFT EYE 2 (TWO) TIMES DAILY.   carvedilol 12.5 MG tablet Commonly known as:  COREG Take 1 tablet (12.5 mg total) by mouth 2 (two) times daily with a meal.   cholecalciferol 1000 units tablet Commonly known as:  VITAMIN D Take 1,000 Units by mouth daily.   dorzolamide-timolol 22.3-6.8 MG/ML ophthalmic solution Commonly known as:  COSOPT Place 1 drop into the left eye 2 (two) times daily.   doxycycline 100 MG tablet Commonly known as:  VIBRA-TABS Take 1 tablet (100 mg total) by mouth every 12 (twelve) hours.   furosemide 80 MG tablet Commonly known as:  LASIX Take 80 mg (1 tabs) in am and 40 mg (1/2 tab) in pm   insulin glargine 100 UNIT/ML injection Commonly known as:  LANTUS Inject 10 Units into the skin at bedtime.   metFORMIN 500 MG tablet Commonly known as:  GLUCOPHAGE Take 1 tablet (500 mg total) by mouth 2 (two) times daily with a meal.   potassium chloride SA 20 MEQ tablet Commonly known as:   K-DUR,KLOR-CON Take 20 mEq by mouth 2 (two) times daily.   sacubitril-valsartan 49-51 MG Commonly known as:  ENTRESTO Take 1 tablet by mouth 2 (two) times daily.   silver sulfADIAZINE 1 % cream Commonly known as:  SILVADENE Apply 1 application topically daily.   tamsulosin 0.4 MG Caps capsule Commonly known as:  FLOMAX Take 1 capsule (0.4 mg total) by mouth daily after supper.   vitamin A 10000 UNIT capsule Take 10,000 Units by mouth daily.            Durable Medical Equipment        Start     Ordered   09/30/16 1120  DME 3-in-1  Once     09/30/16 1125     Follow-up Information    Steve Rattler, DO Follow up on 10/09/2016.   Specialty:  Family Medicine Contact information: [place] [address] [phone]        Arbutus Leas, NP Follow up.   Specialty:  Cardiology  Contact information: [address] [place] [address] [phone]        Newt Minion, MD Follow up.   Specialty:  Orthopedic Surgery Contact information: [address] [place] [address] [phone]          Allergies  Allergen Reactions  . No Known Allergies     Consultations:  None  Procedures/Studies: Dg Chest 1 View  Result Date: 09/24/2016 CLINICAL DATA:  No chest c/o.  Smoker. Htn. Fluid overload EXAM: CHEST 1 VIEW COMPARISON:  10/07/2015 FINDINGS: The heart is enlarged and accentuated by the AP portable position of the patient. The lungs are free of focal consolidations and pleural effusions. No pulmonary edema. IMPRESSION: Stable cardiomegaly. Electronically Signed   By: Nolon Nations M.D.   On: 09/24/2016 14:22   US Scrotum  Result Date: 09/24/2016 CLINICAL DATA:  Swelling and pain EXAM: ULTRASOUND OF SCROTUM TECHNIQUE: Complete ultrasound examination of the testicles, epididymis, and other scrotal structures was performed. COMPARISON:  07/08/2015 FINDINGS: Right testicle Measurements: 4.3 x 2.7 x 2.4 cm. No mass or  microlithiasis visualized. Left testicle Measurements: 3.2 x 3.5 x 2.9 cm. No mass or microlithiasis visualized. Right epididymis:  Normal in size and appearance. Left epididymis:  Normal in size and appearance. Hydrocele:  Moderate bilateral hydroceles. Varicocele: None visualized. Marked scrotal edema and skin thickening, skin thickness on the right measures 1.7 cm and on the left 4.3 cm. No focal fluid collections within the edematous soft tissues of the scrotal sac. IMPRESSION: 1. Normal appearance of the testes 2. Large amount of scrotal edema, left greater than right with skin thickening. 3. Moderate bilateral hydroceles Electronically Signed   By: Donavan Foil M.D.   On: 09/24/2016 19:04   Dg Chest Port 1 View  Result Date: 09/25/2016 CLINICAL DATA:  Follow-up CHF. EXAM: PORTABLE CHEST 1 VIEW COMPARISON:  Chest x-ray of September 24, 2016 FINDINGS: The lungs are adequately inflated. There is no focal infiltrate. There is no pleural effusion. The cardiac silhouette remains mildly enlarged. The pulmonary vascularity is not engorged. There is mild tortuosity of the ascending and descending thoracic aorta. The trachea is midline. The bony thorax exhibits no acute abnormality. IMPRESSION: Stable cardiomegaly.  No acute cardiopulmonary abnormality. Electronically Signed   By: David  Martinique M.D.   On: 09/25/2016 07:09   Subjective: Pt without complaints, states swelling has continued to improve. No dyspnea, chest pain.   Discharge Exam: Vitals:   09/29/16 2121 09/30/16 0523  BP: 121/80 (!) 134/95  Pulse: 62 63  Resp: 20 (!) 21  Temp: 97.7 F (36.5 C) 98.4 F (36.9 C)   General: Chronically ill-appearing male in no distress Cardiovascular: RRR, no murmur, gallop, JVD.  Respiratory: Nonlabored, clear Abdominal: Soft, NT, ND, bowel sounds + Ext: Right BKA, bilateral trace LE edema extending to nontender scrotum which is only mildly edematous, much improved from admission. Skin: LLE with multiple  annular irregular shallow ulcerations without purulence. Dressings c/d/i   Labs: BNP (last 3 results)  Recent Labs  05/08/16 1046 05/24/16 1034 09/24/16 1407  BNP 1,643.0* 2,709.6* 6,294.7*   Basic Metabolic Panel:  Recent Labs Lab 09/26/16 0358 09/27/16 0429 09/28/16 0403 09/29/16 0430 09/30/16 0430  NA 138 136 138 139 142  K 3.7 4.0 4.1 4.4 4.2  CL 103 99* 100* 100* 102  CO2 30 30 32 32 31  GLUCOSE 67 81 119* 103* 62*  BUN 32* 31* 32* 29* 30*  CREATININE 1.29* 1.17 1.12 1.04 1.12  CALCIUM 8.3* 8.1* 8.2* 8.7* 8.7*   CBC:  Recent Labs Lab 09/24/16 1123 09/24/16 1622 09/25/16 0812  WBC 8.3 8.3 8.7  NEUTROABS 6.8  --  7.0  HGB 12.7* 13.3 12.8*  HCT 38.0* 39.5 38.8*  MCV 77.2* 76.6* 76.8*  PLT 312 327 314   Urinalysis    Component Value Date/Time   COLORURINE AMBER (A) 09/24/2016 1050   APPEARANCEUR HAZY (A) 09/24/2016 1050   LABSPEC 1.019 09/24/2016 1050   PHURINE 5.0 09/24/2016 1050   GLUCOSEU NEGATIVE 09/24/2016 1050   HGBUR MODERATE (A) 09/24/2016 1050   BILIRUBINUR NEGATIVE 09/24/2016 1050   KETONESUR NEGATIVE 09/24/2016 1050   PROTEINUR >=300 (A) 09/24/2016 1050   UROBILINOGEN 0.2 07/17/2014 2259   NITRITE NEGATIVE 09/24/2016 1050   LEUKOCYTESUR NEGATIVE 09/24/2016 1050    Microbiology Recent Results (from the past 240 hour(s))  Culture, blood (routine x 2)     Status: None (Preliminary result)   Collection Time: 09/25/16  3:19 PM  Result Value Ref Range Status   Specimen Description LEFT ANTECUBITAL  Final   Special Requests   Final    BOTTLES DRAWN AEROBIC ONLY Blood Culture adequate volume   Culture   Final    NO GROWTH 4 DAYS Performed at [facility], [address], [place], [place] [address]    Report Status PENDING  Incomplete  Culture, blood (routine x 2)     Status: None (Preliminary result)   Collection Time: 09/25/16  3:19 PM  Result Value Ref Range Status   Specimen Description BLOOD RIGHT HAND  Final   Special  Requests IN PEDIATRIC BOTTLE Blood Culture adequate volume  Final   Culture   Final    NO GROWTH 4 DAYS Performed at [facility], [place] [address], [place], [place] [address]    Report Status PENDING  Incomplete    Time coordinating discharge: Approximately 40 minutes  Vance Gather, MD  [facility] 09/30/2016, 11:25 AM Pager [phone]

## 2016-09-30 NOTE — Progress Notes (Signed)
NCM notified [facility] of scheduled dc home today with HH. Jonnie Finner RN CCM Case Mgmt phone [phone]

## 2016-10-02 ENCOUNTER — Other Ambulatory Visit (HOSPITAL_COMMUNITY): Payer: Self-pay

## 2016-10-02 NOTE — Progress Notes (Signed)
Paramedicine Encounter    Patient ID: [name], male    DOB: 03/18/1955, 61 y.o.   MRN: [id number]    Patient Care Team: Steve Rattler, DO as PCP - General  Patient Active Problem List   Diagnosis Date Noted  . Cardiorenal syndrome with renal failure 09/28/2016  . Acute urinary retention 09/26/2016  . Acute on chronic combined systolic and diastolic CHF (congestive heart failure) ([place]) 09/26/2016  . Scrotal edema 09/24/2016  . Multiple open wounds of lower leg, initial encounter 09/24/2016  . Tinea of groin 09/24/2016  . Chronic kidney disease (CKD), stage III (moderate) 06/07/2016  . Chronic combined systolic and diastolic congestive heart failure ([place])   . Protein calorie malnutrition ([place]) 07/12/2015  . Congestive dilated cardiomyopathy ([place]) 07/11/2015  . Pressure ulcer 07/09/2015  . Open knee wound 07/06/2015  . History of right below knee amputation ([place]) 02/22/2015  . PAD (peripheral artery disease) ([place]) 02/22/2015  . Blindness of left eye 01/04/2015  . Complications, amputation stump late ([place]) 08/06/2014  . Diabetic neuropathy associated with type 2 diabetes mellitus ([place]) 12/01/2013  . Noncompliance with medications 07/13/2010  . OTHER SPEC TYPES SCHIZOPHRENIA UNSPEC CONDITION 05/24/2009  . Obesity, unspecified 06/09/2007  . DM (diabetes mellitus), type 2, uncontrolled ([place]) 05/02/2006  . Essential hypertension, benign 05/02/2006    Current Outpatient Prescriptions:  .  atorvastatin (LIPITOR) 40 MG tablet, Take 1 tablet (40 mg total) by mouth daily. (Patient not taking: Reported on 09/24/2016), Disp: 90 tablet, Rfl: 1 .  brimonidine (ALPHAGAN) 0.2 % ophthalmic solution, PLACE 1 DROP INTO THE LEFT EYE 2 (TWO) TIMES DAILY. (Patient not taking: Reported on 09/24/2016), Disp: 5 mL, Rfl: 0 .  carvedilol (COREG) 12.5 MG tablet, Take 1 tablet (12.5 mg total) by mouth 2 (two) times daily with a meal. (Patient not taking: Reported on 09/24/2016), Disp: 60 tablet, Rfl:  6 .  cholecalciferol (VITAMIN D) 1000 units tablet, Take 1,000 Units by mouth daily., Disp: , Rfl:  .  dorzolamide-timolol (COSOPT) 22.3-6.8 MG/ML ophthalmic solution, Place 1 drop into the left eye 2 (two) times daily. (Patient not taking: Reported on 09/24/2016), Disp: 10 mL, Rfl: 12 .  doxycycline (VIBRA-TABS) 100 MG tablet, Take 1 tablet (100 mg total) by mouth every 12 (twelve) hours., Disp: 8 tablet, Rfl: 0 .  furosemide (LASIX) 80 MG tablet, Take 80 mg (1 tabs) in am and 40 mg (1/2 tab) in pm, Disp: 45 tablet, Rfl: 6 .  insulin glargine (LANTUS) 100 UNIT/ML injection, Inject 10 Units into the skin at bedtime. , Disp: , Rfl:  .  metFORMIN (GLUCOPHAGE) 500 MG tablet, Take 1 tablet (500 mg total) by mouth 2 (two) times daily with a meal., Disp: 60 tablet, Rfl: 6 .  potassium chloride SA (K-DUR,KLOR-CON) 20 MEQ tablet, Take 20 mEq by mouth 2 (two) times daily., Disp: , Rfl:  .  sacubitril-valsartan (ENTRESTO) 49-51 MG, Take 1 tablet by mouth 2 (two) times daily., Disp: 60 tablet, Rfl: 3 .  silver sulfADIAZINE (SILVADENE) 1 % cream, Apply 1 application topically daily., Disp: 50 g, Rfl: 2 .  tamsulosin (FLOMAX) 0.4 MG CAPS capsule, Take 1 capsule (0.4 mg total) by mouth daily after supper., Disp: 30 capsule, Rfl: 0 .  vitamin A 10000 UNIT capsule, Take 10,000 Units by mouth daily., Disp: , Rfl:  Allergies  Allergen Reactions  . No Known Allergies      Social History   Social History  . Marital status: Married    Spouse name:  N/A  . Number of children: N/A  . Years of education: N/A   Occupational History  . Not on file.   Social History Main Topics  . Smoking status: Former Smoker    Years: 20.00    Quit date: 05/10/2000  . Smokeless tobacco: Former Systems developer    Quit date: 02/22/2000  . Alcohol use No  . Drug use: No  . Sexual activity: Not on file   Other Topics Concern  . Not on file   Social History Narrative   ** Merged History Encounter **       Lives alone.      Physical Exam  Pulmonary/Chest: No respiratory distress. He has no wheezes. He has no rales.  Abdominal: He exhibits no distension. There is no rebound and no guarding.  Musculoskeletal: He exhibits edema.  Skin: Skin is warm and dry. He is not diaphoretic.        Future Appointments Date Time Provider [place]  10/09/2016 8:30 AM Steve Rattler, DO [facility]  11/22/2016 11:00 AM [facility] PA/NP [facility] None    ATF pt [name] x4 sitting in his wheelchair eating chicken from [employer].  Pt was released from [facility] on Sunday due to enlarged prostate and fluid retention.  Pt is unsure how much fluid was taken off but he does have the medications that were prescribed before he left the hospital.  He hasn't taken any medications from his pill box besides the ones that I gave him during our visit.  He denies sob, dizzness, headache and chest pain. I placed the new meds in the pill box.  Pt is still not weighing at home.                                                 BP (!) 146/96 (BP Location: Right Arm)   Pulse 70   Resp 16   SpO2 98%  cbg 120    Demetria Copper, EMT Paramedic 10/02/2016    ACTION: Home visit completed

## 2016-10-09 ENCOUNTER — Encounter (INDEPENDENT_AMBULATORY_CARE_PROVIDER_SITE_OTHER): Payer: Self-pay | Admitting: Orthopedic Surgery

## 2016-10-09 ENCOUNTER — Telehealth (INDEPENDENT_AMBULATORY_CARE_PROVIDER_SITE_OTHER): Payer: Self-pay | Admitting: *Deleted

## 2016-10-09 ENCOUNTER — Encounter: Payer: Medicaid Other | Admitting: Family Medicine

## 2016-10-09 ENCOUNTER — Ambulatory Visit (INDEPENDENT_AMBULATORY_CARE_PROVIDER_SITE_OTHER): Payer: Medicaid Other | Admitting: Orthopedic Surgery

## 2016-10-09 VITALS — Ht 72.0 in | Wt 207.0 lb

## 2016-10-09 DIAGNOSIS — E1142 Type 2 diabetes mellitus with diabetic polyneuropathy: Secondary | ICD-10-CM

## 2016-10-09 DIAGNOSIS — T879 Unspecified complications of amputation stump: Secondary | ICD-10-CM

## 2016-10-09 DIAGNOSIS — IMO0002 Reserved for concepts with insufficient information to code with codable children: Secondary | ICD-10-CM

## 2016-10-09 DIAGNOSIS — I87332 Chronic venous hypertension (idiopathic) with ulcer and inflammation of left lower extremity: Secondary | ICD-10-CM | POA: Insufficient documentation

## 2016-10-09 DIAGNOSIS — E1165 Type 2 diabetes mellitus with hyperglycemia: Secondary | ICD-10-CM

## 2016-10-09 DIAGNOSIS — L97929 Non-pressure chronic ulcer of unspecified part of left lower leg with unspecified severity: Secondary | ICD-10-CM

## 2016-10-09 DIAGNOSIS — L89892 Pressure ulcer of other site, stage 2: Secondary | ICD-10-CM | POA: Diagnosis not present

## 2016-10-09 DIAGNOSIS — Z794 Long term (current) use of insulin: Secondary | ICD-10-CM

## 2016-10-09 NOTE — Progress Notes (Signed)
Office Visit Note   Patient: [name]           Date of Birth: 1955/08/20           MRN: [id number] Visit Date: 10/09/2016              Requested by: Steve Rattler, DO [place], [place] [address] PCP: Steve Rattler, DO  Chief Complaint  Patient presents with  . Left Leg - Follow-up  . Right Leg - Follow-up    08/10/16 STSG to right BKA tibial tubercle ulceration        HPI: Patient presents in follow-up he is several months status post skin graft to right patella ulcer and has developed multiple ulcers on the left lower extremity. Patient states he gets around by crawling on his knees at home.  Assessment & Plan: Visit Diagnoses:  1. Decubitus ulcer of right knee, stage 2   2. Uncontrolled type 2 diabetes mellitus with diabetic polyneuropathy, with long-term current use of insulin ([place])   3. Complications, amputation stump late ([place])   4. 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 ([place])     Plan: We will apply an abdomen and Dynaflex wraps to the left lower extremity and plan for weekly dressing changes. We will apply Silvadene dressing change to the right knee ulcer patient will change this dressing daily we will change the venous stasis dressing weekly.  Follow-Up Instructions: Return in about 1 week (around 10/16/2016).   Ortho Exam  Patient is alert, oriented, no adenopathy, well-dressed, normal affect, normal respiratory effort. Examination patient has pitting edema in both lower extremities including pitting edema in the thighs with massive edema in the left lower extremity with weeping edema and multiple ulcers which are partial thickness. Some of the ulcers have dark eschar. The largest ulcer is 2 cm in diameter. Patient has a persistent ulcer over the tibial tubercle of the right knee secondary to his crawling on his knee he also has ulcers in the left knee from crawling on the knee as well the right  knee ulcer is 7 cm in diameter and has hyper-granulation tissue. There is no exposed muscle or tendon or joint. Patient has a well-healed transtibial amputation.  Imaging: No results found.  Labs: Lab Results  Component Value Date   HGBA1C 7.9 (H) 09/24/2016   HGBA1C 10.1 (H) 05/24/2016   HGBA1C 13.1 (H) 10/06/2015   ESRSEDRATE 114 (H) 09/23/2013   CRP 37.3 (H) 09/23/2013   REPTSTATUS 09/30/2016 FINAL 09/25/2016   REPTSTATUS 09/30/2016 FINAL 09/25/2016   GRAMSTAIN  09/24/2013    MODERATE WBC PRESENT,BOTH PMN AND MONONUCLEAR MODERATE GRAM POSITIVE COCCI IN PAIRS FEW GRAM VARIABLE ROD Performed at [facility]  09/24/2013    MODERATE WBC PRESENT,BOTH PMN AND MONONUCLEAR ABUNDANT GRAM POSITIVE COCCI IN PAIRS FEW GRAM VARIABLE ROD Performed at [facility]   CULT  09/25/2016    NO GROWTH 5 DAYS Performed at [facility], [address] [place], [place] [address]    CULT  09/25/2016    NO GROWTH 5 DAYS Performed at [facility] [address] [place], [place] [address]     Orders:  No orders of the defined types were placed in this encounter.  No orders of the defined types were placed in this encounter.    Procedures: No procedures performed  Clinical Data: No additional findings.  ROS:  All other systems negative, except as noted in the HPI. Review of Systems  Objective: Vital Signs: Ht 6' (1.829 m)   Wt 207 lb (93.9 kg)   BMI 28.07 kg/m   Specialty Comments:  No specialty comments available.  PMFS History: Patient Active Problem List   Diagnosis Date Noted  . 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 Cardiorenal syndrome with renal failure 09/28/2016  . Acute urinary retention 09/26/2016  . Acute on chronic combined systolic and diastolic CHF (congestive heart failure) ([place]) 09/26/2016  . Scrotal edema 09/24/2016  . Multiple open wounds of  lower leg, initial encounter 09/24/2016  . Tinea of groin 09/24/2016  . Chronic kidney disease (CKD), stage III (moderate) 06/07/2016  . Chronic combined systolic and diastolic congestive heart failure ([place])   . Protein calorie malnutrition ([place]) 07/12/2015  . Congestive dilated cardiomyopathy ([place]) 07/11/2015  . Pressure ulcer 07/09/2015  . Open knee wound 07/06/2015  . History of right below knee amputation ([place]) 02/22/2015  . PAD (peripheral artery disease) ([place]) 02/22/2015  . Blindness of left eye 01/04/2015  . Complications, amputation stump late ([place]) 08/06/2014  . Diabetic neuropathy associated with type 2 diabetes mellitus ([place]) 12/01/2013  . Noncompliance with medications 07/13/2010  . OTHER SPEC TYPES SCHIZOPHRENIA UNSPEC CONDITION 05/24/2009  . Obesity, unspecified 06/09/2007  . DM (diabetes mellitus), type 2, uncontrolled ([place]) 05/02/2006  . Essential hypertension, benign 05/02/2006   Past Medical History:  Diagnosis Date  . Acute on chronic systolic heart failure, NYHA class 3 ([place])   . AKI (acute kidney injury) ([place])   . Anemia   . BKA stump complication ([place])   . Blind left eye   . Blind left eye   . Cardiomyopathy, dilated ([place])   . CHF (congestive heart failure) ([place])   . Combined systolic and diastolic congestive heart failure ([place])   . Congestive dilated cardiomyopathy ([place]) 07/11/2015  . Diabetes mellitus   . Diabetic neuropathy associated with type 2 diabetes mellitus ([place])   . Hypertension   . Neuropathy   . Noncompliance with medications   . Open knee wound 10/2015   on rt bka  . PAD (peripheral artery disease) ([place])   . Protein calorie malnutrition ([place])   . Shortness of breath dyspnea     Family History  Problem Relation Age of Onset  . Cancer Mother   . Peripheral vascular disease Father     Past Surgical History:  Procedure Laterality Date  . AMPUTATION Right 09/26/2013   Procedure: AMPUTATION BELOW KNEE;  Surgeon: Rozanna Box, MD;   Location: [facility];  Service: Orthopedics;  Laterality: Right;  . AMPUTATION Right 05/01/2015   Procedure: REVISION OF RIGHT TRANSTIBIAL AMPUTATION ;  Surgeon: Newt Minion, MD;  Location: [facility];  Service: Orthopedics;  Laterality: Right;  . APPLICATION OF WOUND VAC  08/10/2016   Procedure: APPLICATION OFI NCISONAL  WOUND VAC;  Surgeon: Newt Minion, MD;  Location: [facility];  Service: Orthopedics;;  . CARDIAC CATHETERIZATION N/A 07/13/2015   Procedure: Right/Left Heart Cath and Coronary Angiography;  Surgeon: Jolaine Artist, MD;  Location: [facility];  Service: Cardiovascular;  Laterality: N/A;  . COLONOSCOPY    . I&D EXTREMITY Right 09/24/2013   Procedure: IRRIGATION AND DEBRIDEMENT RIGHT FOOT ULCER;  Surgeon: Renette Butters, MD;  Location: [facility];  Service: Orthopedics;  Laterality: Right;  . I&D EXTREMITY Right 09/26/2013   Procedure: Repeat IRRIGATION AND  DEBRIDEMENT Right Foot Ulcer;  Surgeon: Rozanna Box, MD;  Location: [facility];  Service: Orthopedics;  Laterality: Right;  . MULTIPLE TOOTH EXTRACTIONS    . SKIN SPLIT GRAFT Right 08/10/2016   Procedure: SKIN GRAFT SPLIT THICKNESS RIGHT LEG;  Surgeon: Newt Minion, MD;  Location: [facility];  Service: Orthopedics;  Laterality: Right;  . STUMP REVISION Right 04/20/2015   Procedure: Revision Right Below Knee Amputation, Apply Wound VAC;  Surgeon: Newt Minion, MD;  Location: [facility];  Service: Orthopedics;  Laterality: Right;  . TONSILLECTOMY     Social History   Occupational History  . Not on file.   Social History Main Topics  . Smoking status: Former Smoker    Years: 20.00    Quit date: 05/10/2000  . Smokeless tobacco: Former Systems developer    Quit date: 02/22/2000  . Alcohol use No  . Drug use: No  . Sexual activity: Not on file

## 2016-10-09 NOTE — Telephone Encounter (Signed)
Called pt to let him know Dr. Sharol Given would be late today, he is in emergency surgery. Called to ask if he wanted to reschedule.

## 2016-10-10 ENCOUNTER — Other Ambulatory Visit (HOSPITAL_COMMUNITY): Payer: Self-pay

## 2016-10-10 ENCOUNTER — Telehealth: Payer: Self-pay | Admitting: *Deleted

## 2016-10-10 NOTE — Telephone Encounter (Signed)
LM for patient ok per DPR in chart to please call our office and schedule a hospital follow up with PCP. Jazmin Hartsell,CMA

## 2016-10-10 NOTE — Telephone Encounter (Signed)
-----   Message from Steve Rattler, DO sent at 10/10/2016  2:00 PM EDT ----- Please have Mr. Gann follow up with me after his recent hospitalization as he missed his follow up appointment. Thank you

## 2016-10-10 NOTE — Progress Notes (Signed)
Paramedicine Encounter    Patient ID: [name], male    DOB: 1956/02/12, 61 y.o.   MRN: [id number]    Patient Care Team: Steve Rattler, DO as PCP - General  Patient Active Problem List   Diagnosis Date Noted  . 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 ([place]) 10/09/2016  . Cardiorenal syndrome with renal failure 09/28/2016  . Acute urinary retention 09/26/2016  . Acute on chronic combined systolic and diastolic CHF (congestive heart failure) ([place]) 09/26/2016  . Scrotal edema 09/24/2016  . Multiple open wounds of lower leg, initial encounter 09/24/2016  . Tinea of groin 09/24/2016  . Chronic kidney disease (CKD), stage III (moderate) 06/07/2016  . Chronic combined systolic and diastolic congestive heart failure ([place])   . Protein calorie malnutrition ([place]) 07/12/2015  . Congestive dilated cardiomyopathy ([place]) 07/11/2015  . Pressure ulcer 07/09/2015  . Open knee wound 07/06/2015  . History of right below knee amputation ([place]) 02/22/2015  . PAD (peripheral artery disease) ([place]) 02/22/2015  . Blindness of left eye 01/04/2015  . Complications, amputation stump late ([place]) 08/06/2014  . Diabetic neuropathy associated with type 2 diabetes mellitus ([place]) 12/01/2013  . Noncompliance with medications 07/13/2010  . OTHER SPEC TYPES SCHIZOPHRENIA UNSPEC CONDITION 05/24/2009  . Obesity, unspecified 06/09/2007  . DM (diabetes mellitus), type 2, uncontrolled ([place]) 05/02/2006  . Essential hypertension, benign 05/02/2006    Current Outpatient Prescriptions:  .  atorvastatin (LIPITOR) 40 MG tablet, Take 1 tablet (40 mg total) by mouth daily., Disp: 90 tablet, Rfl: 1 .  carvedilol (COREG) 12.5 MG tablet, Take 1 tablet (12.5 mg total) by mouth 2 (two) times daily with a meal., Disp: 60 tablet, Rfl: 6 .  doxycycline (VIBRA-TABS) 100 MG tablet, Take 1 tablet (100 mg total) by mouth every 12 (twelve) hours., Disp: 8 tablet, Rfl: 0 .  furosemide  (LASIX) 80 MG tablet, Take 80 mg (1 tabs) in am and 40 mg (1/2 tab) in pm, Disp: 45 tablet, Rfl: 6 .  insulin glargine (LANTUS) 100 UNIT/ML injection, Inject 10 Units into the skin at bedtime. , Disp: , Rfl:  .  metFORMIN (GLUCOPHAGE) 500 MG tablet, Take 1 tablet (500 mg total) by mouth 2 (two) times daily with a meal., Disp: 60 tablet, Rfl: 6 .  potassium chloride SA (K-DUR,KLOR-CON) 20 MEQ tablet, Take 20 mEq by mouth 2 (two) times daily., Disp: , Rfl:  .  brimonidine (ALPHAGAN) 0.2 % ophthalmic solution, PLACE 1 DROP INTO THE LEFT EYE 2 (TWO) TIMES DAILY., Disp: 5 mL, Rfl: 0 .  cholecalciferol (VITAMIN D) 1000 units tablet, Take 1,000 Units by mouth daily., Disp: , Rfl:  .  dorzolamide-timolol (COSOPT) 22.3-6.8 MG/ML ophthalmic solution, Place 1 drop into the left eye 2 (two) times daily., Disp: 10 mL, Rfl: 12 .  sacubitril-valsartan (ENTRESTO) 49-51 MG, Take 1 tablet by mouth 2 (two) times daily., Disp: 60 tablet, Rfl: 3 .  silver sulfADIAZINE (SILVADENE) 1 % cream, Apply 1 application topically daily., Disp: 50 g, Rfl: 2 .  tamsulosin (FLOMAX) 0.4 MG CAPS capsule, Take 1 capsule (0.4 mg total) by mouth daily after supper., Disp: 30 capsule, Rfl: 0 .  vitamin A 10000 UNIT capsule, Take 10,000 Units by mouth daily., Disp: , Rfl:  Allergies  Allergen Reactions  . No Known Allergies      Social History   Social History  . Marital status: Married    Spouse name: N/A  . Number of children: N/A  .  Years of education: N/A   Occupational History  . Not on file.   Social History Main Topics  . Smoking status: Former Smoker    Years: 20.00    Quit date: 05/10/2000  . Smokeless tobacco: Former Systems developer    Quit date: 02/22/2000  . Alcohol use No  . Drug use: No  . Sexual activity: Not on file   Other Topics Concern  . Not on file   Social History Narrative   ** Merged History Encounter **       Lives alone.     Physical Exam  Pulmonary/Chest: No respiratory distress. He has no  wheezes. He has no rales.  Abdominal: He exhibits no distension. There is no guarding.  Musculoskeletal: He exhibits edema.  Skin: Skin is warm and dry. He is not diaphoretic.        Future Appointments Date Time Provider [place]  10/16/2016 1:30 PM Newt Minion, MD [facility] None  11/22/2016 11:00 AM [facility] PA/NP [facility] None    ATF pt [name] x4 sitting in his wheelchair watching tv.  Pts home aide was cleaning and cooking.  Pt has only taken medications 3 times since last week. Pt has missed all night meds which includes tamsulosin.  He is now c/o of not being able to urinate.  He stated that theres only drops coming out when he urinates.  I discussed with him the importance of taking his meds and what they were for.  His sister came over prior to me leaving and I asked her to give him the evening meds when she comes over.  [facility] from [facility] also came over to wrap his legs.  Pt has an open sore on both knees; the right one is a little larger.  rx bottles verified and pill box refilled.  **pt's meds were sent to [facility] for delivery   Pt stated that he still cant weigh due to him not being able to wear his pros. Leg.   BP (!) 144/90 (BP Location: Left Arm)   Pulse 83   Resp 12   SpO2 97%   cbg 223 (pt hadn't taken insulin yet)      Demetria Copper, EMT Paramedic 10/10/2016    ACTION: Home visit completed

## 2016-10-11 ENCOUNTER — Telehealth (INDEPENDENT_AMBULATORY_CARE_PROVIDER_SITE_OTHER): Payer: Self-pay | Admitting: Orthopedic Surgery

## 2016-10-11 NOTE — Telephone Encounter (Signed)
Olivia Mackie from [facility] called asking for a clarification on wound care orders. CB # [phone]

## 2016-10-11 NOTE — Telephone Encounter (Signed)
Call to advise that patient will come to the office weekly for dressing changes so we can suspend services for wound care right now for LLE compression dressing being applied weekly in office. Can do silvadene dressing changes to BKA and will advise of any changes that are made to call with questions.

## 2016-10-14 ENCOUNTER — Inpatient Hospital Stay (HOSPITAL_COMMUNITY)
Admission: EM | Admit: 2016-10-14 | Discharge: 2016-10-18 | DRG: 291 | Disposition: A | Discharge status: Home Health | Payer: Medicaid Other | Attending: Internal Medicine | Admitting: Internal Medicine

## 2016-10-14 ENCOUNTER — Encounter (HOSPITAL_COMMUNITY): Payer: Self-pay | Admitting: Emergency Medicine

## 2016-10-14 ENCOUNTER — Emergency Department (HOSPITAL_COMMUNITY): Payer: Medicaid Other

## 2016-10-14 DIAGNOSIS — N183 Chronic kidney disease, stage 3 unspecified: Secondary | ICD-10-CM | POA: Diagnosis present

## 2016-10-14 DIAGNOSIS — E1122 Type 2 diabetes mellitus with diabetic chronic kidney disease: Secondary | ICD-10-CM | POA: Diagnosis present

## 2016-10-14 DIAGNOSIS — Z8249 Family history of ischemic heart disease and other diseases of the circulatory system: Secondary | ICD-10-CM

## 2016-10-14 DIAGNOSIS — K219 Gastro-esophageal reflux disease without esophagitis: Secondary | ICD-10-CM | POA: Diagnosis present

## 2016-10-14 DIAGNOSIS — E785 Hyperlipidemia, unspecified: Secondary | ICD-10-CM | POA: Diagnosis present

## 2016-10-14 DIAGNOSIS — I248 Other forms of acute ischemic heart disease: Secondary | ICD-10-CM | POA: Diagnosis present

## 2016-10-14 DIAGNOSIS — Z87891 Personal history of nicotine dependence: Secondary | ICD-10-CM | POA: Diagnosis not present

## 2016-10-14 DIAGNOSIS — Z9111 Patient's noncompliance with dietary regimen: Secondary | ICD-10-CM

## 2016-10-14 DIAGNOSIS — Z7983 Long term (current) use of bisphosphonates: Secondary | ICD-10-CM

## 2016-10-14 DIAGNOSIS — Z794 Long term (current) use of insulin: Secondary | ICD-10-CM | POA: Diagnosis not present

## 2016-10-14 DIAGNOSIS — Z9114 Patient's other noncompliance with medication regimen: Secondary | ICD-10-CM

## 2016-10-14 DIAGNOSIS — E8779 Other fluid overload: Secondary | ICD-10-CM

## 2016-10-14 DIAGNOSIS — N179 Acute kidney failure, unspecified: Secondary | ICD-10-CM | POA: Diagnosis not present

## 2016-10-14 DIAGNOSIS — E669 Obesity, unspecified: Secondary | ICD-10-CM | POA: Diagnosis present

## 2016-10-14 DIAGNOSIS — E1151 Type 2 diabetes mellitus with diabetic peripheral angiopathy without gangrene: Secondary | ICD-10-CM | POA: Diagnosis present

## 2016-10-14 DIAGNOSIS — N5089 Other specified disorders of the male genital organs: Secondary | ICD-10-CM | POA: Diagnosis present

## 2016-10-14 DIAGNOSIS — L8990 Pressure ulcer of unspecified site, unspecified stage: Secondary | ICD-10-CM | POA: Diagnosis present

## 2016-10-14 DIAGNOSIS — N1831 Chronic kidney disease, stage 3a: Secondary | ICD-10-CM | POA: Diagnosis present

## 2016-10-14 DIAGNOSIS — I87332 Chronic venous hypertension (idiopathic) with ulcer and inflammation of left lower extremity: Secondary | ICD-10-CM | POA: Diagnosis present

## 2016-10-14 DIAGNOSIS — R627 Adult failure to thrive: Secondary | ICD-10-CM | POA: Diagnosis present

## 2016-10-14 DIAGNOSIS — I130 Hypertensive heart and chronic kidney disease with heart failure and stage 1 through stage 4 chronic kidney disease, or unspecified chronic kidney disease: Secondary | ICD-10-CM | POA: Diagnosis present

## 2016-10-14 DIAGNOSIS — Z9119 Patient's noncompliance with other medical treatment and regimen: Secondary | ICD-10-CM

## 2016-10-14 DIAGNOSIS — E11649 Type 2 diabetes mellitus with hypoglycemia without coma: Secondary | ICD-10-CM | POA: Diagnosis not present

## 2016-10-14 DIAGNOSIS — N182 Chronic kidney disease, stage 2 (mild): Secondary | ICD-10-CM | POA: Diagnosis not present

## 2016-10-14 DIAGNOSIS — E8809 Other disorders of plasma-protein metabolism, not elsewhere classified: Secondary | ICD-10-CM | POA: Diagnosis present

## 2016-10-14 DIAGNOSIS — I739 Peripheral vascular disease, unspecified: Secondary | ICD-10-CM | POA: Diagnosis not present

## 2016-10-14 DIAGNOSIS — E8770 Fluid overload, unspecified: Secondary | ICD-10-CM | POA: Diagnosis present

## 2016-10-14 DIAGNOSIS — I509 Heart failure, unspecified: Secondary | ICD-10-CM

## 2016-10-14 DIAGNOSIS — E1142 Type 2 diabetes mellitus with diabetic polyneuropathy: Secondary | ICD-10-CM | POA: Diagnosis not present

## 2016-10-14 DIAGNOSIS — E1165 Type 2 diabetes mellitus with hyperglycemia: Secondary | ICD-10-CM | POA: Diagnosis present

## 2016-10-14 DIAGNOSIS — I5043 Acute on chronic combined systolic (congestive) and diastolic (congestive) heart failure: Secondary | ICD-10-CM | POA: Diagnosis present

## 2016-10-14 DIAGNOSIS — Z6830 Body mass index (BMI) 30.0-30.9, adult: Secondary | ICD-10-CM

## 2016-10-14 DIAGNOSIS — I428 Other cardiomyopathies: Secondary | ICD-10-CM | POA: Diagnosis not present

## 2016-10-14 DIAGNOSIS — R601 Generalized edema: Secondary | ICD-10-CM | POA: Diagnosis not present

## 2016-10-14 DIAGNOSIS — I10 Essential (primary) hypertension: Secondary | ICD-10-CM | POA: Diagnosis not present

## 2016-10-14 DIAGNOSIS — F2089 Other schizophrenia: Secondary | ICD-10-CM | POA: Diagnosis present

## 2016-10-14 DIAGNOSIS — D649 Anemia, unspecified: Secondary | ICD-10-CM | POA: Diagnosis present

## 2016-10-14 DIAGNOSIS — H5440 Blindness, one eye, unspecified eye: Secondary | ICD-10-CM | POA: Diagnosis not present

## 2016-10-14 DIAGNOSIS — H5462 Unqualified visual loss, left eye, normal vision right eye: Secondary | ICD-10-CM | POA: Diagnosis present

## 2016-10-14 DIAGNOSIS — Z993 Dependence on wheelchair: Secondary | ICD-10-CM

## 2016-10-14 DIAGNOSIS — E1149 Type 2 diabetes mellitus with other diabetic neurological complication: Secondary | ICD-10-CM | POA: Diagnosis not present

## 2016-10-14 DIAGNOSIS — I420 Dilated cardiomyopathy: Secondary | ICD-10-CM | POA: Diagnosis present

## 2016-10-14 DIAGNOSIS — IMO0002 Reserved for concepts with insufficient information to code with codable children: Secondary | ICD-10-CM | POA: Diagnosis present

## 2016-10-14 DIAGNOSIS — E119 Type 2 diabetes mellitus without complications: Secondary | ICD-10-CM | POA: Diagnosis present

## 2016-10-14 DIAGNOSIS — Z79899 Other long term (current) drug therapy: Secondary | ICD-10-CM | POA: Diagnosis not present

## 2016-10-14 DIAGNOSIS — E1140 Type 2 diabetes mellitus with diabetic neuropathy, unspecified: Secondary | ICD-10-CM | POA: Diagnosis present

## 2016-10-14 DIAGNOSIS — L97929 Non-pressure chronic ulcer of unspecified part of left lower leg with unspecified severity: Secondary | ICD-10-CM

## 2016-10-14 DIAGNOSIS — S81809A Unspecified open wound, unspecified lower leg, initial encounter: Secondary | ICD-10-CM | POA: Diagnosis present

## 2016-10-14 DIAGNOSIS — Z89511 Acquired absence of right leg below knee: Secondary | ICD-10-CM

## 2016-10-14 DIAGNOSIS — I5023 Acute on chronic systolic (congestive) heart failure: Secondary | ICD-10-CM | POA: Diagnosis not present

## 2016-10-14 DIAGNOSIS — H547 Unspecified visual loss: Secondary | ICD-10-CM | POA: Diagnosis present

## 2016-10-14 LAB — URINALYSIS, ROUTINE W REFLEX MICROSCOPIC
Bacteria, UA: NONE SEEN
Bilirubin Urine: NEGATIVE
Glucose, UA: NEGATIVE mg/dL
Ketones, ur: NEGATIVE mg/dL
Nitrite: NEGATIVE
Protein, ur: 100 mg/dL — AB
RBC / HPF: NONE SEEN RBC/hpf (ref 0–5)
Specific Gravity, Urine: 1.013 (ref 1.005–1.030)
Squamous Epithelial / LPF: NONE SEEN
WBC, UA: NONE SEEN WBC/hpf (ref 0–5)
pH: 5 (ref 5.0–8.0)

## 2016-10-14 LAB — CBC WITH DIFFERENTIAL/PLATELET
Basophils Absolute: 0 10*3/uL (ref 0.0–0.1)
Basophils Relative: 0 %
Eosinophils Absolute: 0 10*3/uL (ref 0.0–0.7)
Eosinophils Relative: 0 %
HCT: 37.5 % — ABNORMAL LOW (ref 39.0–52.0)
Hemoglobin: 12.1 g/dL — ABNORMAL LOW (ref 13.0–17.0)
Lymphocytes Relative: 13 %
Lymphs Abs: 1.2 10*3/uL (ref 0.7–4.0)
MCH: 25.3 pg — ABNORMAL LOW (ref 26.0–34.0)
MCHC: 32.3 g/dL (ref 30.0–36.0)
MCV: 78.3 fL (ref 78.0–100.0)
Monocytes Absolute: 0.4 10*3/uL (ref 0.1–1.0)
Monocytes Relative: 5 %
Neutro Abs: 7.1 10*3/uL (ref 1.7–7.7)
Neutrophils Relative %: 82 %
Platelets: 318 10*3/uL (ref 150–400)
RBC: 4.79 MIL/uL (ref 4.22–5.81)
RDW: 19.6 % — ABNORMAL HIGH (ref 11.5–15.5)
WBC: 8.7 10*3/uL (ref 4.0–10.5)

## 2016-10-14 LAB — BASIC METABOLIC PANEL
Anion gap: 8 (ref 5–15)
BUN: 28 mg/dL — ABNORMAL HIGH (ref 6–20)
CO2: 25 mmol/L (ref 22–32)
Calcium: 8.4 mg/dL — ABNORMAL LOW (ref 8.9–10.3)
Chloride: 107 mmol/L (ref 101–111)
Creatinine, Ser: 1.38 mg/dL — ABNORMAL HIGH (ref 0.61–1.24)
GFR calc Af Amer: >60 mL/min (ref >60)
GFR calc non Af Amer: 54 mL/min — ABNORMAL LOW (ref >60)
Glucose, Bld: 111 mg/dL — ABNORMAL HIGH (ref 65–99)
Potassium: 4.4 mmol/L (ref 3.5–5.1)
Sodium: 140 mmol/L (ref 135–145)

## 2016-10-14 LAB — GLUCOSE, CAPILLARY
Glucose-Capillary: 121 mg/dL — ABNORMAL HIGH (ref 65–99)
Glucose-Capillary: 121 mg/dL — ABNORMAL HIGH (ref 65–99)

## 2016-10-14 LAB — CBG MONITORING, ED: Glucose-Capillary: 88 mg/dL (ref 65–99)

## 2016-10-14 LAB — TROPONIN I
Troponin I: 0.04 ng/mL (ref <0.03)
Troponin I: 0.05 ng/mL (ref <0.03)
Troponin I: 0.04 ng/mL (ref <0.03)

## 2016-10-14 LAB — BRAIN NATRIURETIC PEPTIDE: B Natriuretic Peptide: >4500 pg/mL — ABNORMAL HIGH (ref 0.0–100.0)

## 2016-10-14 LAB — PROTIME-INR
INR: 1.39
Prothrombin Time: 17.2 seconds — ABNORMAL HIGH (ref 11.4–15.2)

## 2016-10-14 LAB — TSH: TSH: 1.216 u[IU]/mL (ref 0.350–4.500)

## 2016-10-14 MED ORDER — VITAMIN A 10000 UNITS PO CAPS
10000.0000 [IU] | ORAL_CAPSULE | Freq: Every day | ORAL | Status: DC
  Administered 2016-10-14 – 2016-10-18 (×5): 10000 [IU] via ORAL
  Filled 2016-10-14 (×5): qty 1

## 2016-10-14 MED ORDER — DORZOLAMIDE HCL-TIMOLOL MAL 2-0.5 % OP SOLN
1.0000 [drp] | Freq: Two times a day (BID) | OPHTHALMIC | Status: DC
  Administered 2016-10-14 – 2016-10-18 (×8): 1 [drp] via OPHTHALMIC
  Filled 2016-10-14: qty 10

## 2016-10-14 MED ORDER — ACETAMINOPHEN 650 MG RE SUPP: 650.0000 mg | Freq: Four times a day (QID) | RECTAL | Status: DC | PRN

## 2016-10-14 MED ORDER — BISACODYL 10 MG RE SUPP: 10.0000 mg | Freq: Every day | RECTAL | Status: DC | PRN

## 2016-10-14 MED ORDER — POTASSIUM CHLORIDE CRYS ER 20 MEQ PO TBCR
20.0000 meq | EXTENDED_RELEASE_TABLET | Freq: Two times a day (BID) | ORAL | Status: DC
  Administered 2016-10-14 – 2016-10-18 (×8): 20 meq via ORAL
  Filled 2016-10-14 (×8): qty 1

## 2016-10-14 MED ORDER — FUROSEMIDE 10 MG/ML IJ SOLN
60.0000 mg | Freq: Two times a day (BID) | INTRAMUSCULAR | Status: DC
  Administered 2016-10-14 – 2016-10-15 (×2): 60 mg via INTRAVENOUS
  Filled 2016-10-14 (×2): qty 6

## 2016-10-14 MED ORDER — ONDANSETRON HCL 4 MG PO TABS: 4.0000 mg | ORAL_TABLET | Freq: Four times a day (QID) | ORAL | Status: DC | PRN

## 2016-10-14 MED ORDER — CARVEDILOL 12.5 MG PO TABS
12.5000 mg | ORAL_TABLET | Freq: Two times a day (BID) | ORAL | Status: DC
  Administered 2016-10-14 – 2016-10-15 (×2): 12.5 mg via ORAL
  Filled 2016-10-14 (×2): qty 1

## 2016-10-14 MED ORDER — SACUBITRIL-VALSARTAN 49-51 MG PO TABS
1.0000 | ORAL_TABLET | Freq: Two times a day (BID) | ORAL | Status: DC
  Administered 2016-10-14 – 2016-10-16 (×6): 1 via ORAL
  Filled 2016-10-14 (×7): qty 1

## 2016-10-14 MED ORDER — FUROSEMIDE 10 MG/ML IJ SOLN
40.0000 mg | Freq: Once | INTRAMUSCULAR | Status: AC
  Administered 2016-10-14: 40 mg via INTRAVENOUS
  Filled 2016-10-14: qty 4

## 2016-10-14 MED ORDER — ATORVASTATIN CALCIUM 40 MG PO TABS
40.0000 mg | ORAL_TABLET | Freq: Every day | ORAL | Status: DC
  Administered 2016-10-14 – 2016-10-18 (×5): 40 mg via ORAL
  Filled 2016-10-14 (×5): qty 1

## 2016-10-14 MED ORDER — ACETAMINOPHEN 325 MG PO TABS: 650.0000 mg | ORAL_TABLET | Freq: Four times a day (QID) | ORAL | Status: DC | PRN

## 2016-10-14 MED ORDER — HYDRALAZINE HCL 20 MG/ML IJ SOLN: 5.0000 mg | Freq: Three times a day (TID) | INTRAMUSCULAR | Status: DC | PRN

## 2016-10-14 MED ORDER — DEXTROSE 5 % IV SOLN
1.0000 g | INTRAVENOUS | Status: DC
  Administered 2016-10-14: 1 g via INTRAVENOUS
  Filled 2016-10-14 (×2): qty 10

## 2016-10-14 MED ORDER — HEPARIN SODIUM (PORCINE) 5000 UNIT/ML IJ SOLN
5000.0000 [IU] | Freq: Three times a day (TID) | INTRAMUSCULAR | Status: DC
Start: 2016-10-14 — End: 2016-10-18
  Administered 2016-10-14 – 2016-10-18 (×12): 5000 [IU] via SUBCUTANEOUS
  Filled 2016-10-14 (×13): qty 1

## 2016-10-14 MED ORDER — MORPHINE SULFATE (PF) 4 MG/ML IV SOLN
6.0000 mg | Freq: Once | INTRAVENOUS | Status: AC
  Administered 2016-10-14: 6 mg via INTRAVENOUS
  Filled 2016-10-14: qty 2

## 2016-10-14 MED ORDER — SENNOSIDES-DOCUSATE SODIUM 8.6-50 MG PO TABS: 1.0000 | ORAL_TABLET | Freq: Every evening | ORAL | Status: DC | PRN

## 2016-10-14 MED ORDER — ONDANSETRON HCL 4 MG/2ML IJ SOLN: 4.0000 mg | Freq: Four times a day (QID) | INTRAMUSCULAR | Status: DC | PRN

## 2016-10-14 MED ORDER — FUROSEMIDE 20 MG PO TABS: 40.0000 mg | ORAL_TABLET | Freq: Two times a day (BID) | ORAL | Status: DC

## 2016-10-14 MED ORDER — INSULIN ASPART 100 UNIT/ML ~~LOC~~ SOLN
0.0000 [IU] | Freq: Three times a day (TID) | SUBCUTANEOUS | Status: DC
  Administered 2016-10-16: 2 [IU] via SUBCUTANEOUS
  Administered 2016-10-16 – 2016-10-17 (×2): 1 [IU] via SUBCUTANEOUS

## 2016-10-14 MED ORDER — INSULIN GLARGINE 100 UNIT/ML ~~LOC~~ SOLN
10.0000 [IU] | Freq: Every day | SUBCUTANEOUS | Status: DC
  Administered 2016-10-14: 10 [IU] via SUBCUTANEOUS
  Filled 2016-10-14: qty 0.1

## 2016-10-14 MED ORDER — OXYCODONE-ACETAMINOPHEN 5-325 MG PO TABS: 1.0000 | ORAL_TABLET | ORAL | Status: DC | PRN

## 2016-10-14 MED ORDER — BRIMONIDINE TARTRATE 0.2 % OP SOLN
1.0000 [drp] | Freq: Two times a day (BID) | OPHTHALMIC | Status: DC
  Administered 2016-10-14 – 2016-10-18 (×8): 1 [drp] via OPHTHALMIC
  Filled 2016-10-14: qty 5

## 2016-10-14 MED ORDER — TAMSULOSIN HCL 0.4 MG PO CAPS
0.4000 mg | ORAL_CAPSULE | Freq: Every day | ORAL | Status: DC
  Administered 2016-10-14 – 2016-10-17 (×4): 0.4 mg via ORAL
  Filled 2016-10-14 (×4): qty 1

## 2016-10-14 MED ORDER — HYDROMORPHONE HCL 1 MG/ML IJ SOLN: 1.0000 mg | INTRAMUSCULAR | Status: DC | PRN

## 2016-10-14 NOTE — H&P (Signed)
History and Physical    [name] HBZ:[id number] DOB: [birth year] DOA: 10/14/2016   PCP: Steve Rattler, DO   Patient coming from:  Home    Chief Complaint: Penile swelling and pain   HPI: [name] is a 61 y.o. male with multiple medical comorbid disease, including poorly controlled diabetes, chronic systolic heart failure with known EF of 20 to 25%, peripheral vascular disease, status post right BKA, with poor healing one on the right BKA stump, followed by Dr. Sharol Given, presenting today to the hospital, with increasing scrotal edema and pain. The patient is mostly wheelchair bound. He is able to urinate, although he does have decreased output in the setting of the swelling and chronic kidney disease. He does reports some dysuria. He denies any fever or chills. He denies any increasing shortness of breath, although these is mostly with exertion. He denies any productive cough. He denies any hemoptysis. He denies any chest pain or palpitations. He denies any abdominal pain, although he reports increasing abdominal swelling. He does have decreased appetite due to current symptoms. He denies any nausea or vomiting. Of note, the patient has not been compliant with his medications, the last time that he has taking his diuretics and the other home medicines was 2 days ago. Also, he has not been compliant with his dressing changes, last Don 1 week ago in the left lower extremity, by Dr. Sharol Given. He has multiple other known healing ones, which she has not been applying the proper creams. Of note, he is not aware of how much weight he has gained over the last 2 weeks regarding fluid. .  ED Course:  BP (!) 140/107 (BP Location: Right Arm)   Pulse 69   Temp (!) 97.3 F (36.3 C) (Oral)   Resp 17 Comment: Simultaneous filing. User may not have seen previous data.  SpO2 94%    BNP 4500 sodium 140 potassium 4.4  bicarb 25  glucose 111  BUN 28 creatinine 1.38, slightly higher from prior baseline  anywhere from 1-1,12 troponin 0.04 white count 8.7 hemoglobin 12.1 platelets 318 chest x-ray stable cardiomegaly, with no acute pulmonary process identified last 2D echo on 09/24/2016 shows EF 20 to 25% with severely review systolic function. Urine shows trace of leukocytes, small hemoglobin and negative nitrite EKG sinus rhythm, no ACS patient received IV Lasix 40 mgx1 at 830 in the morning. He also was given morphine sulfate IV 6 mgx.1  Review of Systems: As per HPI otherwise 10 point review of systems negative.   Past Medical History:  Diagnosis Date  . Acute on chronic systolic heart failure, NYHA class 3 ([place])   . AKI (acute kidney injury) ([place])   . Anemia   . BKA stump complication ([place])   . Blind left eye   . Blind left eye   . Cardiomyopathy, dilated ([place])   . CHF (congestive heart failure) ([place])   . Combined systolic and diastolic congestive heart failure ([place])   . Congestive dilated cardiomyopathy ([place]) 07/11/2015  . Diabetes mellitus   . Diabetic neuropathy associated with type 2 diabetes mellitus ([place])   . Hypertension   . Neuropathy   . Noncompliance with medications   . Open knee wound 10/2015   on rt bka  . PAD (peripheral artery disease) ([place])   . Protein calorie malnutrition ([place])   . Shortness of breath dyspnea     Past Surgical History:  Procedure Laterality Date  . AMPUTATION Right 09/26/2013   Procedure:  AMPUTATION BELOW KNEE;  Surgeon: Rozanna Box, MD;  Location: [facility];  Service: Orthopedics;  Laterality: Right;  . AMPUTATION Right 05/01/2015   Procedure: REVISION OF RIGHT TRANSTIBIAL AMPUTATION ;  Surgeon: Newt Minion, MD;  Location: [facility];  Service: Orthopedics;  Laterality: Right;  . APPLICATION OF WOUND VAC  08/10/2016   Procedure: APPLICATION OFI NCISONAL  WOUND VAC;  Surgeon: Newt Minion, MD;  Location: [facility];  Service: Orthopedics;;  . CARDIAC CATHETERIZATION N/A 07/13/2015   Procedure: Right/Left Heart Cath and Coronary Angiography;  Surgeon:  Jolaine Artist, MD;  Location: [facility];  Service: Cardiovascular;  Laterality: N/A;  . COLONOSCOPY    . I&D EXTREMITY Right 09/24/2013   Procedure: IRRIGATION AND DEBRIDEMENT RIGHT FOOT ULCER;  Surgeon: Renette Butters, MD;  Location: [facility];  Service: Orthopedics;  Laterality: Right;  . I&D EXTREMITY Right 09/26/2013   Procedure: Repeat IRRIGATION AND DEBRIDEMENT Right Foot Ulcer;  Surgeon: Rozanna Box, MD;  Location: [facility];  Service: Orthopedics;  Laterality: Right;  . MULTIPLE TOOTH EXTRACTIONS    . SKIN SPLIT GRAFT Right 08/10/2016   Procedure: SKIN GRAFT SPLIT THICKNESS RIGHT LEG;  Surgeon: Newt Minion, MD;  Location: [facility];  Service: Orthopedics;  Laterality: Right;  . STUMP REVISION Right 04/20/2015   Procedure: Revision Right Below Knee Amputation, Apply Wound VAC;  Surgeon: Newt Minion, MD;  Location: [facility];  Service: Orthopedics;  Laterality: Right;  . TONSILLECTOMY      Social History Social History   Social History  . Marital status: Married    Spouse name: N/A  . Number of children: N/A  . Years of education: N/A   Occupational History  . Not on file.   Social History Main Topics  . Smoking status: Former Smoker    Years: 20.00    Quit date: 05/10/2000  . Smokeless tobacco: Former Systems developer    Quit date: 02/22/2000  . Alcohol use No  . Drug use: No  . Sexual activity: Not on file   Other Topics Concern  . Not on file   Social History Narrative   ** Merged History Encounter **       Lives alone.      Allergies  Allergen Reactions  . No Known Allergies     Family History  Problem Relation Age of Onset  . Cancer Mother   . Peripheral vascular disease Father       Prior to Admission medications   Medication Sig Start Date End Date Taking? Authorizing Provider  atorvastatin (LIPITOR) 40 MG tablet Take 1 tablet (40 mg total) by mouth daily. 01/24/16   Clegg, Amy D, NP  brimonidine (ALPHAGAN) 0.2 % ophthalmic solution PLACE 1 DROP INTO  THE LEFT EYE 2 (TWO) TIMES DAILY. 06/15/15   Mariel Aloe, MD  carvedilol (COREG) 12.5 MG tablet Take 1 tablet (12.5 mg total) by mouth 2 (two) times daily with a meal. 02/07/16   Clegg, Amy D, NP  cholecalciferol (VITAMIN D) 1000 units tablet Take 1,000 Units by mouth daily.    [provider]  dorzolamide-timolol (COSOPT) 22.3-6.8 MG/ML ophthalmic solution Place 1 drop into the left eye 2 (two) times daily. 09/28/13   Virginia Crews, MD  doxycycline (VIBRA-TABS) 100 MG tablet Take 1 tablet (100 mg total) by mouth every 12 (twelve) hours. 09/30/16   Patrecia Pour, MD  furosemide (LASIX) 80 MG tablet Take 80 mg (1 tabs) in am and  40 mg (1/2 tab) in pm 05/24/16   Shirley Friar, PA-C  insulin glargine (LANTUS) 100 UNIT/ML injection Inject 10 Units into the skin at bedtime.     [provider]  metFORMIN (GLUCOPHAGE) 500 MG tablet Take 1 tablet (500 mg total) by mouth 2 (two) times daily with a meal. 10/10/15   Clegg, Amy D, NP  potassium chloride SA (K-DUR,KLOR-CON) 20 MEQ tablet Take 20 mEq by mouth 2 (two) times daily.    [provider]  sacubitril-valsartan (ENTRESTO) 49-51 MG Take 1 tablet by mouth 2 (two) times daily. 08/23/16   Arbutus Leas, NP  silver sulfADIAZINE (SILVADENE) 1 % cream Apply 1 application topically daily. 08/15/16   Suzan Slick, NP  tamsulosin (FLOMAX) 0.4 MG CAPS capsule Take 1 capsule (0.4 mg total) by mouth daily after supper. 09/30/16   Patrecia Pour, MD  vitamin A 10000 UNIT capsule Take 10,000 Units by mouth daily.    [provider]    Physical Exam:  Vitals:   10/14/16 0715 10/14/16 0815 10/14/16 0915 10/14/16 0930  BP: (!) 134/99 (!) 139/93 (!) 115/96 (!) 140/107  Pulse: 71 71 81 69  Resp: 14 14  17   Temp:      TempSrc:      SpO2: 94% 96% 94%    Constitutional: NAD, calm, Chronically ill appearing.  Eyes: PERRL, lids and conjunctivae normal ENMT: Mucous membranes are moist, without exudate or lesions  Neck:  normal, supple, no masses, no thyromegaly. No JVD Respiratory: clear to auscultation bilaterally, no wheezing, no crackles. Normal respiratory effort  Cardiovascular: Regular rate and rhythm, no murmurs, rubs or gallops. 2+ edema on the left lower extremity which extends up to the abdomen  . 2+ pedal pulses. No carotid bruits.  Abdomen: Soft, non tender, possible fluid wave noted.. Bowel sounds positive. These pelvic swelling, with massive scrotal swelling and penile swelling. No erythema is noted Musculoskeletal: no clubbing / cyanosis. Right BKA as described below Skin: no jaundice he has known left lower extremity ones, decubitus ulcer on the right knee. Dressings around the left lower extremity appear with increased 0 17 use fluid, but with all door. The dressing appears very dirty. Neurologic: Sensation intact  Strength equal in all extremities Psychiatric:   Alert and oriented x 3. Normal mood. Unclear if the patient has slow mentation, as he is able to answer simple questions.    Labs on Admission: I have personally reviewed following labs and imaging studies  CBC:  Recent Labs Lab 10/14/16 0556  WBC 8.7  NEUTROABS 7.1  HGB 12.1*  HCT 37.5*  MCV 78.3  PLT 244    Basic Metabolic Panel:  Recent Labs Lab 10/14/16 0556  NA 140  K 4.4  CL 107  CO2 25  GLUCOSE 111*  BUN 28*  CREATININE 1.38*  CALCIUM 8.4*    GFR: Estimated Creatinine Clearance: 68.6 mL/min (A) (by C-G formula based on SCr of 1.38 mg/dL (H)).  Liver Function Tests: No results for input(s): AST, ALT, ALKPHOS, BILITOT, PROT, ALBUMIN in the last 168 hours. No results for input(s): LIPASE, AMYLASE in the last 168 hours. No results for input(s): AMMONIA in the last 168 hours.  Coagulation Profile: No results for input(s): INR, PROTIME in the last 168 hours.  Cardiac Enzymes:  Recent Labs Lab 10/14/16 0556  TROPONINI 0.04*    BNP (last 3 results) No results for input(s): PROBNP in the last 8760  hours.  HbA1C: No results for input(s):  HGBA1C in the last 72 hours.  CBG: No results for input(s): GLUCAP in the last 168 hours.  Lipid Profile: No results for input(s): CHOL, HDL, LDLCALC, TRIG, CHOLHDL, LDLDIRECT in the last 72 hours.  Thyroid Function Tests: No results for input(s): TSH, T4TOTAL, FREET4, T3FREE, THYROIDAB in the last 72 hours.  Anemia Panel: No results for input(s): VITAMINB12, FOLATE, FERRITIN, TIBC, IRON, RETICCTPCT in the last 72 hours.  Urine analysis:    Component Value Date/Time   COLORURINE YELLOW 10/14/2016 [place] 10/14/2016 0943   LABSPEC 1.013 10/14/2016 0943   PHURINE 5.0 10/14/2016 0943   GLUCOSEU NEGATIVE 10/14/2016 0943   HGBUR SMALL (A) 10/14/2016 0943   BILIRUBINUR NEGATIVE 10/14/2016 0943   KETONESUR NEGATIVE 10/14/2016 0943   PROTEINUR 100 (A) 10/14/2016 0943   UROBILINOGEN 0.2 07/17/2014 2259   NITRITE NEGATIVE 10/14/2016 0943   LEUKOCYTESUR TRACE (A) 10/14/2016 0943    Sepsis Labs: @LABRCNTIP (procalcitonin:4,lacticidven:4) )No results found for this or any previous visit (from the past 240 hour(s)).   Radiological Exams on Admission: Dg Chest Portable 1 View  Result Date: 10/14/2016 CLINICAL DATA:  61 y/o  M; shortness of breath. EXAM: PORTABLE CHEST 1 VIEW COMPARISON:  09/25/2016 chest radiograph FINDINGS: Stable cardiomegaly given projection and technique. Clear lungs. No pleural effusion or pneumothorax. No acute osseous abnormality is evident. IMPRESSION: Stable cardiomegaly.  No acute pulmonary process identified. Electronically Signed   By: Kristine Garbe M.D.   On: 10/14/2016 06:19    EKG: Independently reviewed.  Assessment/Plan Active Problems:   Scrotal edema   DM (diabetes mellitus), type 2, uncontrolled (HCC)   Obesity, unspecified   Essential hypertension, benign   Noncompliance with medications   Diabetic neuropathy associated with type 2 diabetes mellitus ([place])   Blindness of left  eye   History of right below knee amputation ([place])   PAD (peripheral artery disease) (HCC)   Pressure ulcer   Congestive dilated cardiomyopathy (HCC)   Chronic kidney disease (CKD), stage III (moderate)   Multiple open wounds of lower leg, initial encounter   Acute on chronic combined systolic and diastolic CHF (congestive heart failure) (HCC)   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  CXR non revealing BNP 4500  Last 2 D echo last 2D echo on 09/24/2016 shows EF 20 to 25% with severely review systolic function. troponin 0.04. EKG sinus rhythm, no ACS Weight not tested yet  Received 40  IV Lasix in ED . Patient was not compliant with meds .   Telemetry Inpatient   TSH   Cycle troponins   Daily weights and strict I/O   ACEI in am, held today due to acute on chronic renal insufficiency  Continue Carvedilol    Lasix 60 mg bid IV    2 gm sodium diet with  1.8 LL fluid restriction  CXR in am  EKG as needed   Scrotal edema and pain, likely due to above. Doubt testicular torsion, as this has been going on for several weeks including last admission. Last ultrasound was negative for torsion. WBC normal. Afebrile  Will continue to provide IB diuretics, and monitor any improvements. UA  positive for trace leukocytes, will check cultures, as well as start Rocephin  IV per pharmacy Blood and urine culture  Pain control  Decreased Urine Output, suspect secondary to massive scrotal and penile edema, in the setting of CKD 3   Continue Lasix 60 mg IV bid  Continue FLomax  Will hold on placing catheter due to likely UTI and pain     Hypertension BP 140/107 Pulse 69  last dose 2 days ago. Denies headaches  Resume home anti-hypertensive medications  Hydralazine prn  160/90  Hyperlipidemia Continue home statins   GERD, no acute symptoms Continue PPI  Type II Diabetes Current blood sugar level is 111 Lab Results  Component Value Date   HGBA1C 7.9 (H) 09/24/2016  Hgb A1C Lantus , SSI  Right BKA/Deconditioning/Poor wound care  Consult for OT/PT Consult to wound care  Care management and social work consult for SNF placement versus HHN    DVT prophylaxis:Heparin  Code Status:   Full    Family Communication:  Discussed with patient Disposition Plan: Expect patient to be discharged to SNF  after condition improves Consults called:    Wound care, PT/OT, Social Work and Care management  Admission status:Tele Inpatient  Jake Jumbo, PA-C [facility]   10/14/2016, 10:43 AM

## 2016-10-14 NOTE — ED Triage Notes (Signed)
Patient from home, with scrotal pain and swelling.  Patient has been seen multiple times for same.  Patient having pain when moving in scrotal area.  Patient also with large wound on right stump/BKA.  Patient states he is seeing Dr Sharol Given for wound.

## 2016-10-14 NOTE — Progress Notes (Signed)
Pt would not allow yellow Fall Rick armband placement.

## 2016-10-14 NOTE — ED Notes (Signed)
Bladder scan result 120 mL

## 2016-10-14 NOTE — Progress Notes (Signed)
Patient noted to have multiple chronic wounds upon admission to [facility].  Right knee wound 5.5x7 cm hard eschar present, one small area of yellow exudate in the center, no drainage.  Left lower leg covered with multiple ulcerations largest left lower medial leg 4x3 cms, scant serous drainage, left lower lateral leg 4x2 cms scant serous drainage.  Left knee 3x1.5 cms open area that is dry, left upper leg noted to have two small scabs.  Left 2nd toe with ulcer that is dry .5x.5 cms.  Left lower leg covered with xeroform gauze and kerlix to keep moist and also prevent patient from picking at these as patient noted to be continuously picking at wounds.  Foam dressing placed on right knee again to keep patient from picking at area.   Order for [facility] consult has been made.  Patient states he was wearing an unna boot to left leg when he came to the emergency room.

## 2016-10-14 NOTE — Consult Note (Signed)
[facility] Nurse wound consult note Reason for Consult: Multiple wounds on the left LE.  Patient seen by this writer three weeks ago on 09/25/16 and there has been minor improvement. See note for that date. Biggest change is that wounds are cleaner and free of necrotic slough. Dr. Sharol Given sees as an outpatient on a regular basis. Management is by Unna's boot with [facility]) assisting with application on weeks patient is not seen by Dr. Sharol Given. Wound type:Venous insufficiency (knee wounds are traumatic as patient scratches and "picks" at them continuously) Pressure Injury POA: N/A Measurement: Right knee:5.5cm x 7cm dry eschar Left knee: 3cm x 1.5cm x 0.2cm full thickness areas with dry pink base. Left LE with numerous ulcerations (chronic, non-healing) the largest of which is medial and measures 6cm x 3cm with light yellow slough obscuring 50% of wound bed and another in the lateral LE measuring 2cm x 7cm with grey/white slough obscurring 50% of wound bed.   Left 2nd digit with dry eschar measuring 0.5cm round Wound bed:Red, dry. Drainage (amount, consistency, odor) None; wounds are moist, but not draining at the time of my assessment. Wound care provided earlier today by [facility], so wounds are clean today with less odor than my previous visit. Periwound: Intact, dry with evidence of previous wound healing. Dressing procedure/placement/frequency: We will cover wounds with xeroform, top with silicone foam and contact the [facility] to apply Unna's boot today with next change due on Thursday. Suggest these continue with twice weekly changes by [facility] until otherwise directed by Dr. Sharol Given in a follow up appointment. [facility] nursing team will not follow, but will remain available to this patient, the nursing and medical teams.  Please re-consult if needed. Thanks, Maudie Flakes, MSN, RN, [place], Arther Abbott  Pager# [phone]

## 2016-10-15 ENCOUNTER — Inpatient Hospital Stay (HOSPITAL_COMMUNITY): Payer: Medicaid Other

## 2016-10-15 ENCOUNTER — Ambulatory Visit: Payer: Medicaid Other | Admitting: Family Medicine

## 2016-10-15 DIAGNOSIS — I5043 Acute on chronic combined systolic (congestive) and diastolic (congestive) heart failure: Secondary | ICD-10-CM

## 2016-10-15 DIAGNOSIS — I10 Essential (primary) hypertension: Secondary | ICD-10-CM

## 2016-10-15 DIAGNOSIS — N183 Chronic kidney disease, stage 3 (moderate): Secondary | ICD-10-CM

## 2016-10-15 DIAGNOSIS — N182 Chronic kidney disease, stage 2 (mild): Secondary | ICD-10-CM

## 2016-10-15 DIAGNOSIS — R601 Generalized edema: Secondary | ICD-10-CM

## 2016-10-15 DIAGNOSIS — N179 Acute kidney failure, unspecified: Secondary | ICD-10-CM

## 2016-10-15 DIAGNOSIS — I428 Other cardiomyopathies: Secondary | ICD-10-CM

## 2016-10-15 DIAGNOSIS — Z9114 Patient's other noncompliance with medication regimen: Secondary | ICD-10-CM

## 2016-10-15 DIAGNOSIS — I5023 Acute on chronic systolic (congestive) heart failure: Secondary | ICD-10-CM

## 2016-10-15 LAB — COMPREHENSIVE METABOLIC PANEL
ALT: 19 U/L (ref 17–63)
AST: 18 U/L (ref 15–41)
Albumin: 2 g/dL — ABNORMAL LOW (ref 3.5–5.0)
Alkaline Phosphatase: 252 U/L — ABNORMAL HIGH (ref 38–126)
Anion gap: 8 (ref 5–15)
BUN: 28 mg/dL — ABNORMAL HIGH (ref 6–20)
CO2: 29 mmol/L (ref 22–32)
Calcium: 8.4 mg/dL — ABNORMAL LOW (ref 8.9–10.3)
Chloride: 104 mmol/L (ref 101–111)
Creatinine, Ser: 1.38 mg/dL — ABNORMAL HIGH (ref 0.61–1.24)
GFR calc Af Amer: >60 mL/min (ref >60)
GFR calc non Af Amer: 54 mL/min — ABNORMAL LOW (ref >60)
Glucose, Bld: 84 mg/dL (ref 65–99)
Potassium: 3.9 mmol/L (ref 3.5–5.1)
Sodium: 141 mmol/L (ref 135–145)
Total Bilirubin: 1.3 mg/dL — ABNORMAL HIGH (ref 0.3–1.2)
Total Protein: 5.7 g/dL — ABNORMAL LOW (ref 6.5–8.1)

## 2016-10-15 LAB — CBC
HCT: 35.1 % — ABNORMAL LOW (ref 39.0–52.0)
Hemoglobin: 11.4 g/dL — ABNORMAL LOW (ref 13.0–17.0)
MCH: 26.1 pg (ref 26.0–34.0)
MCHC: 32.5 g/dL (ref 30.0–36.0)
MCV: 80.5 fL (ref 78.0–100.0)
Platelets: 297 10*3/uL (ref 150–400)
RBC: 4.36 MIL/uL (ref 4.22–5.81)
RDW: 20.1 % — ABNORMAL HIGH (ref 11.5–15.5)
WBC: 7.8 10*3/uL (ref 4.0–10.5)

## 2016-10-15 LAB — GLUCOSE, CAPILLARY
Glucose-Capillary: 29 mg/dL — CL (ref 65–99)
Glucose-Capillary: 93 mg/dL (ref 65–99)
Glucose-Capillary: 76 mg/dL (ref 65–99)
Glucose-Capillary: 84 mg/dL (ref 65–99)
Glucose-Capillary: 107 mg/dL — ABNORMAL HIGH (ref 65–99)

## 2016-10-15 LAB — PROTIME-INR
INR: 1.39
Prothrombin Time: 17.2 seconds — ABNORMAL HIGH (ref 11.4–15.2)

## 2016-10-15 LAB — URINE CULTURE

## 2016-10-15 LAB — TROPONIN I: Troponin I: 0.04 ng/mL (ref <0.03)

## 2016-10-15 LAB — HEMOGLOBIN A1C
Hgb A1c MFr Bld: 7.2 % — ABNORMAL HIGH (ref 4.8–5.6)
Mean Plasma Glucose: 160 mg/dL

## 2016-10-15 MED ORDER — ACETAMINOPHEN 325 MG PO TABS: 650.0000 mg | ORAL_TABLET | Freq: Four times a day (QID) | ORAL | Status: DC | PRN

## 2016-10-15 MED ORDER — METOLAZONE 2.5 MG PO TABS
2.5000 mg | ORAL_TABLET | Freq: Once | ORAL | Status: AC
  Administered 2016-10-15: 2.5 mg via ORAL
  Filled 2016-10-15: qty 1

## 2016-10-15 MED ORDER — FUROSEMIDE 10 MG/ML IJ SOLN
80.0000 mg | Freq: Two times a day (BID) | INTRAMUSCULAR | Status: DC
  Administered 2016-10-15: 80 mg via INTRAVENOUS
  Filled 2016-10-15 (×2): qty 8

## 2016-10-15 MED ORDER — OXYCODONE-ACETAMINOPHEN 5-325 MG PO TABS: 1.0000 | ORAL_TABLET | Freq: Four times a day (QID) | ORAL | Status: DC | PRN

## 2016-10-15 MED ORDER — DEXTROSE 50 % IV SOLN
INTRAVENOUS | Status: AC
  Administered 2016-10-15: 50 mL
  Filled 2016-10-15: qty 50

## 2016-10-15 MED ORDER — BISOPROLOL FUMARATE 5 MG PO TABS
5.0000 mg | ORAL_TABLET | Freq: Every day | ORAL | Status: DC
  Administered 2016-10-15 – 2016-10-18 (×4): 5 mg via ORAL
  Filled 2016-10-15 (×4): qty 1

## 2016-10-15 NOTE — Evaluation (Signed)
Physical Therapy Evaluation Patient Details Name: [name] MRN: [id number] DOB: 04/29/1955 Today's Date: 10/15/2016   History of Present Illness   [name] is a 61 y.o. male with multiple medical comorbid disease, including poorly controlled diabetes, chronic systolic heart failure with known EF of 20 to 25%, peripheral vascular disease, status post right BKA, with poor healing one on the right BKA stump, followed by Dr. Sharol Given, presenting today to the hospital, with increasing scrotal edema and pain in scrotum.   Clinical Impression  Pt very debilitated and is w/c bound at home. Pt unable to use RW safely due to L LE Weakness and has to crawl on knees to enter bathroom resulting in bilat knee wounds. Pt also requires assist for meal prep, bathing, dressing and outside mobility. Pt unsafe to return home alone as he can not take care of himself safely or properly. Pt to benefit from SNF upon d/c to address mentioned deficits for safe transition home.    Follow Up Recommendations SNF    Equipment Recommendations  None recommended by PT    Recommendations for Other Services       Precautions / Restrictions Precautions Precautions: Fall Precaution Comments: significant scrotal edema Restrictions Other Position/Activity Restrictions: Right BKA, bilat knees and L foot wounds      Mobility  Bed Mobility Overal bed mobility: Needs Assistance Bed Mobility: Sit to Supine Rolling:  (bed rails)     Sit to supine: Supervision   General bed mobility comments: extra time; flat bed, no rails, difficulty with controlling descent into bed  Transfers Overall transfer level: Needs assistance Equipment used: None Transfers: Lateral/Scoot Transfers          Lateral/Scoot Transfers: Min guard General transfer comment: pt used UEs, limited L LE WBing, increased pain at scrotum, v/c's for safety and technique  Ambulation/Gait             General Gait Details:  unable  Stairs            Wheelchair Mobility    Modified Rankin (Stroke Patients Only)       Balance Overall balance assessment: Needs assistance Sitting-balance support: Feet supported;No upper extremity supported Sitting balance-Leahy Scale: Fair                                       Pertinent Vitals/Pain Pain Assessment: No/denies pain (at rest, pain with movement at scrotum)    Home Living Family/patient expects to be discharged to:: Private residence Living Arrangements: Alone Available Help at Discharge: Family;Available PRN/intermittently Type of Home: Independent living facility Home Access: Ramped entrance     Home Layout: One level Home Equipment: [place] - 2 wheels;Bedside commode;Wheelchair - manual Additional Comments: pt states he washes at sink and doesn't use 3:1 over toilet    Prior Function Level of Independence: Needs assistance   Gait / Transfers Assistance Needed: Primarily uses manual w/c for mobility; w/c does not fit into bathroom, so pt crawls on knees contributing to bilat knee wounds  ADL's / Homemaking Assistance Needed: Someone helps with cooking/household tasks 1x/wk; sister helps intermittently other days        Hand Dominance   Dominant Hand: Right    Extremity/Trunk Assessment   Upper Extremity Assessment Upper Extremity Assessment: Overall WFL for tasks assessed    Lower Extremity Assessment Lower Extremity Assessment: LLE deficits/detail RLE Deficits / Details: R BKA, residual  limb and knee wrapped in dressing RLE Sensation: history of peripheral neuropathy LLE Deficits / Details: generalized weakness and in dressing for multiple wounds    Cervical / Trunk Assessment Cervical / Trunk Assessment: Normal  Communication   Communication: No difficulties  Cognition Arousal/Alertness: Awake/alert Behavior During Therapy: WFL for tasks assessed/performed Overall Cognitive Status: Within Functional Limits  for tasks assessed                                        General Comments General comments (skin integrity, edema, etc.): L eye legally blind; acuity deficits in R eye    Exercises     Assessment/Plan    PT Assessment Patient needs continued PT services  PT Problem List Decreased strength;Decreased mobility;Decreased knowledge of use of DME;Impaired sensation;Decreased activity tolerance;Decreased balance       PT Treatment Interventions DME instruction;Therapeutic activities;Therapeutic exercise;Functional mobility training;Patient/family education;Wheelchair mobility training;Balance training    PT Goals (Current goals can be found in the Care Plan section)  Acute Rehab PT Goals Patient Stated Goal: Return home PT Goal Formulation: With patient Time For Goal Achievement: 10/29/16 Potential to Achieve Goals: Good    Frequency Min 3X/week   Barriers to discharge Inaccessible home environment;Decreased caregiver support pt only has support 3x/wk for a couple of house and sister who stops in to drop off meals. pt unable to access bathroom with w/c and pt crawls on his knees which has resulted in bilat knee wounds. pt unable to cook for self, bath self, or demo safe mobility    Co-evaluation               AM-PAC PT "6 Clicks" Daily Activity  Outcome Measure Difficulty turning over in bed (including adjusting bedclothes, sheets and blankets)?: None Difficulty moving from lying on back to sitting on the side of the bed? : Total Difficulty sitting down on and standing up from a chair with arms (e.g., wheelchair, bedside commode, etc,.)?: Total Help needed moving to and from a bed to chair (including a wheelchair)?: A Lot Help needed walking in hospital room?: Total Help needed climbing 3-5 steps with a railing? : Total 6 Click Score: 10    End of Session   Activity Tolerance: Patient tolerated treatment well Patient left: with call bell/phone within  reach;in bed;with chair alarm set Nurse Communication: Mobility status PT Visit Diagnosis: Muscle weakness (generalized) (M62.81);Other abnormalities of gait and mobility (R26.89)    Time: 5974-1638 PT Time Calculation (min) (ACUTE ONLY): 19 min   Charges:   PT Evaluation $PT Eval Moderate Complexity: 1 Mod     PT G Codes:        Kittie Plater, PT, DPT Pager #: [phone] Office #: [phone]   [name] 10/15/2016, 3:55 PM

## 2016-10-15 NOTE — Evaluation (Signed)
Occupational Therapy Evaluation Patient Details Name: [name] MRN: [id number] DOB: 1955-09-16 Today's Date: 10/15/2016    History of Present Illness  [name] is a 61 y.o. male with multiple medical comorbid disease, including poorly controlled diabetes, chronic systolic heart failure with known EF of 20 to 25%, peripheral vascular disease, status post right BKA, with poor healing one on the right BKA stump, followed by Dr. Sharol Given, presenting today to the hospital, with increasing scrotal edema and pain in scrotum.    Clinical Impression   Pt admitted with above. He demonstrates the below listed deficits and will benefit from continued OT to maximize safety and independence with BADLs.  Pt presents to OT with generalized weakness, decreased activity tolerance, and pain.  He was living alone, but had an aide and sister who assisted with IADLs.  He reports bathroom is not fully accessible, so he lowers himself out of w/c and crawls to and from toilet.   Discussed recommendation for SNF with him, but he reports he is unable to afford SNF, and therefore, plans to return home.  Feel home is an unsafe option and ultimately recommend SNF with transition to ALF, however, am unsure that pt will be agreeable to this plan.  If he refuses, recommend HHOT.       Follow Up Recommendations  SNF - if pt refuses, recommend Home health OT    Equipment Recommendations  None recommended by OT    Recommendations for Other Services       Precautions / Restrictions Precautions Precautions: Fall Precaution Comments: significant scrotal edema Restrictions Other Position/Activity Restrictions: Right BKA, bilat knees and L foot wounds      Mobility Bed Mobility Overal bed mobility: Needs Assistance Bed Mobility: Supine to Sit     Supine to sit: Supervision        Transfers Overall transfer level: Needs assistance Equipment used: None Transfers: Lateral/Scoot Transfers          Lateral/Scoot Transfers: Min guard General transfer comment: Pt able to scoot from bed to drop arm recliner     Balance Overall balance assessment: Needs assistance Sitting-balance support: Feet supported;No upper extremity supported Sitting balance-Leahy Scale: Fair                                     ADL either performed or assessed with clinical judgement   ADL Overall ADL's : Needs assistance/impaired Eating/Feeding: Modified independent Eating/Feeding Details (indicate cue type and reason): frequently drops items due to low vision  Grooming: Wash/dry hands;Wash/dry face;Oral care;Brushing hair;Set up;Sitting   Upper Body Bathing: Set up;Sitting   Lower Body Bathing: Min guard;Sitting/lateral leans;Bed level   Upper Body Dressing : Min guard;Sitting   Lower Body Dressing: Moderate assistance;Sitting/lateral leans;Bed level   Toilet Transfer: Minimal assistance;Anterior/posterior;BSC   Toileting- Water quality scientist and Hygiene: Moderate assistance;Sitting/lateral lean       Functional mobility during ADLs: Min guard;Minimal assistance General ADL Comments: Pt is min guard assist for lateral transfer.      Vision Baseline Vision/History:  (blind lt eye, and decreased vision Rt ) Patient Visual Report: No change from baseline       Perception     Praxis      Pertinent Vitals/Pain Pain Assessment: Faces Faces Pain Scale: Hurts even more Pain Location: scrotum - during mobililty  Pain Descriptors / Indicators: Moaning Pain Intervention(s): Limited activity within patient's tolerance;Monitored during session  Hand Dominance Right   Extremity/Trunk Assessment Upper Extremity Assessment Upper Extremity Assessment: Generalized weakness   Lower Extremity Assessment Lower Extremity Assessment: Defer to PT evaluation   Cervical / Trunk Assessment Cervical / Trunk Assessment: Normal   Communication Communication Communication: No  difficulties   Cognition Arousal/Alertness: Awake/alert Behavior During Therapy: WFL for tasks assessed/performed Overall Cognitive Status: Within Functional Limits for tasks assessed                                     General Comments       Exercises     Shoulder Instructions      Home Living Family/patient expects to be discharged to:: Private residence Living Arrangements: Alone Available Help at Discharge: Family;Available PRN/intermittently Type of Home: Independent living facility Home Access: Ramped entrance     Home Layout: One level         Bathroom Toilet: Standard     Home Equipment: Walker - 2 wheels;Bedside commode;Wheelchair - manual   Additional Comments: pt states he washes at sink.  bathroom is not fully accessible to w/c, and he reports he lowers himself to the floor, and crawls to the toilet, then climbs up on it.       Prior Functioning/Environment Level of Independence: Needs assistance  Gait / Transfers Assistance Needed: Primarily uses manual w/c for mobility; w/c does not fit into bathroom, so pt crawls on knees contributing to bilat knee wounds ADL's / Homemaking Assistance Needed: aide assists with cooking/household tasks 1x/wk; sister helps intermittently other days            OT Problem List: Decreased strength;Decreased activity tolerance;Impaired balance (sitting and/or standing);Decreased safety awareness;Decreased knowledge of use of DME or AE;Pain      OT Treatment/Interventions: Self-care/ADL training;Therapeutic exercise;DME and/or AE instruction;Therapeutic activities;Patient/family education;Balance training    OT Goals(Current goals can be found in the care plan section) Acute Rehab OT Goals Patient Stated Goal: Return home OT Goal Formulation: With patient Time For Goal Achievement: 10/29/16 Potential to Achieve Goals: Good ADL Goals Pt Will Perform Lower Body Bathing: with modified independence;sit  to/from stand;sitting/lateral leans;bed level Pt Will Perform Lower Body Dressing: with modified independence;sit to/from stand;sitting/lateral leans;bed level Pt Will Transfer to Toilet: with modified independence;anterior/posterior transfer;bedside commode Pt Will Perform Toileting - Clothing Manipulation and hygiene: with modified independence;sit to/from stand  OT Frequency: Min 2X/week   Barriers to D/C:            Co-evaluation              AM-PAC PT "6 Clicks" Daily Activity     Outcome Measure Help from another person eating meals?: None Help from another person taking care of personal grooming?: A Little Help from another person toileting, which includes using toliet, bedpan, or urinal?: A Little Help from another person bathing (including washing, rinsing, drying)?: A Little Help from another person to put on and taking off regular upper body clothing?: A Little Help from another person to put on and taking off regular lower body clothing?: A Lot 6 Click Score: 18   End of Session Nurse Communication: Mobility status  Activity Tolerance: Patient tolerated treatment well Patient left: in chair;with call bell/phone within reach;with chair alarm set  OT Visit Diagnosis: Muscle weakness (generalized) (M62.81)                Time: 0109-3235 OT Time Calculation (min): 30 min  Charges:  OT General Charges $OT Visit: 1 Procedure OT Evaluation $OT Eval Moderate Complexity: 1 Procedure OT Treatments $Therapeutic Activity: 8-22 mins G-Codes:     [facility], OTR/L [phone]   Lucille Passy M 10/15/2016, 7:23 PM

## 2016-10-15 NOTE — Progress Notes (Signed)
Orthopedic Tech Progress Note Patient Details:  [name] 1955/09/29 [id number]  Ortho Devices Type of Ortho Device: Louretta Parma boot Ortho Device/Splint Interventions: Application   [name] 10/15/2016, 11:46 AM

## 2016-10-15 NOTE — Progress Notes (Signed)
Patient known to me through involvement in the [facility].   He has been refusing to take prescribed medications even after continued reinforcement and explanation.  He tells the Paramedic that he does not feel that he "needs" all the medications.  He also has been unable to perform daily weights at home due to an ill fitting prosthesis.  He will remain enrolled in the [facility] and they will continue to reinforce medication compliance.

## 2016-10-15 NOTE — Progress Notes (Signed)
PROGRESS NOTE   [name]  WYO:[id number]    DOB: 1956-03-03    DOA: 10/14/2016  PCP: Steve Rattler, DO   I have briefly reviewed patients previous medical records in [facility].  Brief Narrative:  61 year old male, wheelchair mobile, with PMH of HTN, DM, PAD status post right BKA, chronic systolic CHF due to nonischemic cardiomyopathy, 2-D echo 07/2015: LVEF 15%, stage III chronic kidney disease, blindness, multiple wounds, severe noncompliance with medications and diet (also noted by home health RN during visits), presented to the ED on 10/14/16 with increasing lower extremity and scrotal swelling, difficulty urinating, dyspnea on exertion without cough, chest pain or palpitations. Increased abdominal swelling area did decrease appetite. Admitted for decompensated CHF. Cardiology consulted 10/15/16.    Assessment & Plan:   Active Problems:   DM (diabetes mellitus), type 2, uncontrolled (HCC)   Obesity, unspecified   Essential hypertension, benign   Noncompliance with medications   Diabetic neuropathy associated with type 2 diabetes mellitus ([place])   Blindness of left eye   History of right below knee amputation ([place])   PAD (peripheral artery disease) (HCC)   Pressure ulcer   Congestive dilated cardiomyopathy (HCC)   Chronic kidney disease (CKD), stage III (moderate)   Scrotal edema   Multiple open wounds of lower leg, initial encounter   Acute on chronic combined systolic and diastolic CHF (congestive heart failure) (HCC)   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 Acute on chronic systolic CHF/NICM: 2-D echo 09/24/16: LVEF 20-25 percent, diffuse hypokinesis. As per home EMT visits in chart review, non-compliant with diet and medications. BMP on admission >4500. He started IV Lasix 60 mg twice a day. -61 ML yesterday. Still has significant volume overload. Continue current Lasix. Continue  carvedilol, Sacubitril-Valsartan. Consulted cardiology for further assistance. TSH: 1.216. 2. Acute on stage II chronic kidney disease: Baseline creatinine in the 1-1.1 and drainage. Presented with creatinine of 1.38 likely due to poor perfusion/cardiorenal syndrome and medications. Stable compared to yesterday. Follow creatinine closely while being diuresed. 3. Anasarca: Secondary to decompensated CHF, renal insufficiency and hypoalbuminemia. Management as above. Dietitian consultation. 4. Essential hypertension: Mildly uncontrolled. Continue current management and follow. 5. Hyperlipidemia: Statins. 6. Elevated troponin: Mild and flat trend. No chest pain. Likely secondary to demand ischemia from decompensated CHF and renal insufficiency. 7. Normocytic anemia: No bleeding reported. Follow CBCs. 8. Type II DM/hypoglycemia: DC Lantus. As per nursing, refusing to consistently eat hospital food which she does not like. Increased oral intake. Continue SSI. Monitor closely. 9. Multiple wounds: WOC RN input 8/12 appreciated and management per their recommendations. Follow-up with Dr. Lillie Fragmin as outpatient. 10. Noncompliance: Counseled extensively regarding importance of compliance with all aspects of medical care. 11. PAD status post right BKA 12. Blindness: Completely blinded left eye and significantly impaired visual acuity in the right eye. 13. Adult failure to thrive, multifactorial.   DVT prophylaxis:  Subcutaneous heparin Code Status:  Full Family Communication:  None at bedside Disposition:  To be determined pending medical improvement and PT evaluation.   Consultants:  Cardiology   Procedures:  None  Antimicrobials:  None    Subjective:  Hypoglycemic episode this morning which resolved after eating/drinking something. As per RN, patient refusing to eat hospital food. Patient states that he is feeling better with improvement in breathing, decreased swelling. Denies pain.  ROS:  No  dizziness, lightheadedness, cough. No further difficulty urinating or dysuria.  Objective:  Vitals:   10/15/16 0012 10/15/16 0443 10/15/16 0809 10/15/16 0810  BP: 124/81 123/84 (!) 144/101 (!) 142/101  Pulse: 67 61  73  Resp: 18 18  18   Temp: 97.7 F (36.5 C) (!) 97.4 F (36.3 C)  97.9 F (36.6 C)  TempSrc: Oral Oral  Oral  SpO2: 95% 96%  95%  Weight:  103.1 kg (227 lb 4.7 oz)    Height:        Examination:  General exam: Pleasant middle-aged male, moderately built and nourished, sitting up comfortably in bed. Has just finished eating breakfast. Eyes: Corneal opacity in left eye with total visual loss in that eye. Right eye with perception of hand movements only. Respiratory system:  Reduced breath sounds in the bases with occasional basal crackles. Rest of lung fields clear to auscultation. Respiratory effort normal. Cardiovascular system: S1 & S2 heard, RRR. No JVD, murmurs, rubs, gallops or clicks.  2+ pitting leg edema on left and right thigh/AKA stump. Telemetry sinus rhythm. Gastrointestinal system: Abdomen is nondistended, soft and nontender. No organomegaly or masses felt. Normal bowel sounds heard. Central nervous system: Alert and oriented. No focal neurological deficits. Extremities: Symmetric 5 x 5 power. Skin:  Multiple skin wounds, evaluation as per [place] RN note on 8/12 and these have been dressed.  Psychiatry: Judgement and insight appear normal. Mood & affect appropriate.     Data Reviewed: I have personally reviewed following labs and imaging studies  CBC:  Recent Labs Lab 10/14/16 0556 10/15/16 0336  WBC 8.7 7.8  NEUTROABS 7.1  --   HGB 12.1* 11.4*  HCT 37.5* 35.1*  MCV 78.3 80.5  PLT 318 409   Basic Metabolic Panel:  Recent Labs Lab 10/14/16 0556 10/15/16 0336  NA 140 141  K 4.4 3.9  CL 107 104  CO2 25 29  GLUCOSE 111* 84  BUN 28* 28*  CREATININE 1.38* 1.38*  CALCIUM 8.4* 8.4*   Liver Function Tests:  Recent Labs Lab 10/15/16 0336    AST 18  ALT 19  ALKPHOS 252*  BILITOT 1.3*  PROT 5.7*  ALBUMIN 2.0*   Coagulation Profile:  Recent Labs Lab 10/14/16 1522 10/15/16 0336  INR 1.39 1.39   Cardiac Enzymes:  Recent Labs Lab 10/14/16 0556 10/14/16 1522 10/14/16 2115 10/15/16 0336  TROPONINI 0.04* 0.05* 0.04* 0.04*   HbA1C: No results for input(s): HGBA1C in the last 72 hours. CBG:  Recent Labs Lab 10/14/16 1130 10/14/16 1623 10/14/16 2100 10/15/16 0733 10/15/16 0803  GLUCAP 88 121* 121* 29* 93    Recent Results (from the past 240 hour(s))  Urine culture     Status: Abnormal   Collection Time: 10/14/16 11:33 AM  Result Value Ref Range Status   Specimen Description URINE, CLEAN CATCH  Final   Special Requests NONE  Final   Culture MULTIPLE SPECIES PRESENT, SUGGEST RECOLLECTION (A)  Final   Report Status 10/15/2016 FINAL  Final         Radiology Studies: Dg Chest 2 View  Result Date: 10/15/2016 CLINICAL DATA:  CHF EXAM: CHEST  2 VIEW COMPARISON:  Chest radiograph from one day prior. FINDINGS: Stable cardiomediastinal silhouette with mild enlargement of the cardiopericardial silhouette. No pneumothorax. Trace bilateral pleural effusions. Cephalization of the pulmonary vasculature without overt pulmonary edema. IMPRESSION: Stable enlargement of the cardiopericardial silhouette without overt pulmonary edema. Trace bilateral pleural effusions. Electronically Signed   By: Ilona Sorrel M.D.   On: 10/15/2016 07:40   Dg Chest Portable 1 View  Result Date: 10/14/2016 CLINICAL DATA:  61 y/o  M; shortness of breath. EXAM: PORTABLE CHEST 1 VIEW COMPARISON:  09/25/2016 chest radiograph FINDINGS: Stable cardiomegaly given projection and technique. Clear lungs. No pleural effusion or pneumothorax. No acute osseous abnormality is evident. IMPRESSION: Stable cardiomegaly.  No acute pulmonary process identified. Electronically Signed   By: Kristine Garbe M.D.   On: 10/14/2016 06:19         Scheduled Meds: . atorvastatin  40 mg Oral Daily  . brimonidine  1 drop Left Eye BID  . carvedilol  12.5 mg Oral BID WC  . dorzolamide-timolol  1 drop Left Eye BID  . furosemide  60 mg Intravenous BID  . heparin  5,000 Units Subcutaneous Q8H  . insulin aspart  0-9 Units Subcutaneous TID WC  . potassium chloride SA  20 mEq Oral BID  . sacubitril-valsartan  1 tablet Oral BID  . tamsulosin  0.4 mg Oral QPC supper  . vitamin A  10,000 Units Oral Daily   Continuous Infusions: . cefTRIAXone (ROCEPHIN)  IV Stopped (10/14/16 1519)     LOS: 1 day     HONGALGI,ANAND, MD, FACP, FHM. [facility] Pager [phone]  If 7PM-7AM, please contact night-coverage [email or web address] Password [facility] 10/15/2016, 10:28 AM

## 2016-10-15 NOTE — Consult Note (Signed)
Advanced Heart Failure Team Consult Note   Primary Physician: Dr. Vanetta Shawl Primary Cardiologist:  Dr. Haroldine Laws   Reason for Consultation: Acute on chronic systolic CHF  HPI:    [name] is seen today for evaluation of acute on chronic systolic CHF at the request of Dr Algis Liming.   [name] is a 61 y.o. male with history of Chronic systolic CHF due to NICM, Echo 09/24/16 with LVEF 20-25%, HTN, DM2, PAD s/p R BKA with poorly healing wounds, CKD stage 3, and history of medical non-compliance.   Last seen in HF clinic 08/23/2016. Medications adjusted due to non-compliance. (was taking all medications once daily regardless of dosing).    Seen by Paramedicine 10/10/16. Had only taken his medications 3 times that week, and no evening doses.   Admitted 10/14/16 from ED with scrotal edema and pain along with worsening peripheral edema and SOB. Pertinent labs on admission include Cr 1.38, BUN 28, K 3.9, BNP > 4500, Hgb 11.4, and troponin flat. CXR this am with cardiomegaly and trace bilateral pleural effusions.   Has not been taking medications regularly at home. Poor income so gets whatever the can, doesn't always watch salt or fluid.  He feels like his fluid has been getting worse over the past 2-3 weeks. Weight up ~ 20 lbs by his home scales.  He doesn't like to take all of his medications as he is afraid this will "hurt him". Poorly healing wounds on R stump and now left foot.   Review of Systems: [y] = yes, [ ]  = no   General: Weight gain [y]; Weight loss [ ] ; Anorexia [ ] ; Fatigue [y]; Fever [ ] ; Chills [ ] ; Weakness [ ]   Cardiac: Chest pain/pressure [ ] ; Resting SOB [ ] ; Exertional SOB [y]; Orthopnea [y]; Pedal Edema [y]; Palpitations [ ] ; Syncope [ ] ; Presyncope [ ] ; Paroxysmal nocturnal dyspnea[ ]   Pulmonary: Cough [ ] ; Wheezing[ ] ; Hemoptysis[ ] ; Sputum [ ] ; Snoring [ ]   GI: Vomiting[ ] ; Dysphagia[ ] ; Melena[ ] ; Hematochezia [ ] ; Heartburn[ ] ; Abdominal pain [ ] ; Constipation [  ]; Diarrhea [ ] ; BRBPR [ ]   GU: Hematuria[ ] ; Dysuria [ ] ; Nocturia[ ]   Vascular: Pain in legs with walking [ ] ; Pain in feet with lying flat [ ] ; Non-healing sores [ ] ; Stroke [ ] ; TIA [ ] ; Slurred speech [ ] ;  Neuro: Headaches[ ] ; Vertigo[ ] ; Seizures[ ] ; Paresthesias[ ] ;Blurred vision [ ] ; Diplopia [ ] ; Vision changes [ ]   Ortho/Skin: Arthritis [y]; Joint pain [y]; Muscle pain [ ] ; Joint swelling [ ] ; Back Pain [ ] ; Rash [ ]   Psych: Depression[ ] ; Anxiety[ ]   Heme: Bleeding problems [ ] ; Clotting disorders [ ] ; Anemia [ ]   Endocrine: Diabetes [y]; Thyroid dysfunction[ ]   Home Medications Prior to Admission medications   Medication Sig Start Date End Date Taking? Authorizing Provider  furosemide (LASIX) 80 MG tablet Take 80 mg (1 tabs) in am and 40 mg (1/2 tab) in pm Patient taking differently: Take 80 mg by mouth daily.  05/24/16  Yes Shirley Friar, PA-C  insulin glargine (LANTUS) 100 UNIT/ML injection Inject 10 Units into the skin at bedtime.    Yes [provider]  metFORMIN (GLUCOPHAGE) 500 MG tablet Take 1 tablet (500 mg total) by mouth 2 (two) times daily with a meal. 10/10/15  Yes Clegg, Amy D, NP  potassium chloride SA (K-DUR,KLOR-CON) 20 MEQ tablet Take 40 mEq by mouth daily.    Yes  [provider]  sacubitril-valsartan (ENTRESTO) 49-51 MG Take 1 tablet by mouth 2 (two) times daily. 08/23/16  Yes Arbutus Leas, NP  silver sulfADIAZINE (SILVADENE) 1 % cream Apply 1 application topically daily. 08/15/16  Yes Suzan Slick, NP  atorvastatin (LIPITOR) 40 MG tablet Take 1 tablet (40 mg total) by mouth daily. 01/24/16   Clegg, Amy D, NP  brimonidine (ALPHAGAN) 0.2 % ophthalmic solution PLACE 1 DROP INTO THE LEFT EYE 2 (TWO) TIMES DAILY. 06/15/15   Mariel Aloe, MD  carvedilol (COREG) 12.5 MG tablet Take 1 tablet (12.5 mg total) by mouth 2 (two) times daily with a meal. 02/07/16   Clegg, Amy D, NP  cholecalciferol (VITAMIN D) 1000 units tablet Take 1,000 Units by  mouth daily.    [provider]  dorzolamide-timolol (COSOPT) 22.3-6.8 MG/ML ophthalmic solution Place 1 drop into the left eye 2 (two) times daily. 09/28/13   Virginia Crews, MD  doxycycline (VIBRA-TABS) 100 MG tablet Take 1 tablet (100 mg total) by mouth every 12 (twelve) hours. 09/30/16   Patrecia Pour, MD  tamsulosin (FLOMAX) 0.4 MG CAPS capsule Take 1 capsule (0.4 mg total) by mouth daily after supper. 09/30/16   Patrecia Pour, MD  vitamin A 10000 UNIT capsule Take 10,000 Units by mouth daily.    [provider]    Past Medical History: Past Medical History:  Diagnosis Date  . Acute on chronic systolic heart failure, NYHA class 3 ([place])   . AKI (acute kidney injury) ([place])   . Anemia   . BKA stump complication ([place])   . Blind left eye   . Blind left eye   . Cardiomyopathy, dilated ([place])   . CHF (congestive heart failure) ([place])   . Combined systolic and diastolic congestive heart failure ([place])   . Congestive dilated cardiomyopathy ([place]) 07/11/2015  . Diabetes mellitus   . Diabetic neuropathy associated with type 2 diabetes mellitus ([place])   . Hypertension   . Neuropathy   . Noncompliance with medications   . Open knee wound 10/2015   on rt bka  . PAD (peripheral artery disease) ([place])   . Protein calorie malnutrition ([place])   . Shortness of breath dyspnea     Past Surgical History: Past Surgical History:  Procedure Laterality Date  . AMPUTATION Right 09/26/2013   Procedure: AMPUTATION BELOW KNEE;  Surgeon: Rozanna Box, MD;  Location: [facility];  Service: Orthopedics;  Laterality: Right;  . AMPUTATION Right 05/01/2015   Procedure: REVISION OF RIGHT TRANSTIBIAL AMPUTATION ;  Surgeon: Newt Minion, MD;  Location: [facility];  Service: Orthopedics;  Laterality: Right;  . APPLICATION OF WOUND VAC  08/10/2016   Procedure: APPLICATION OFI NCISONAL  WOUND VAC;  Surgeon: Newt Minion, MD;  Location: [facility];  Service: Orthopedics;;  . CARDIAC CATHETERIZATION N/A 07/13/2015    Procedure: Right/Left Heart Cath and Coronary Angiography;  Surgeon: Jolaine Artist, MD;  Location: [facility];  Service: Cardiovascular;  Laterality: N/A;  . COLONOSCOPY    . I&D EXTREMITY Right 09/24/2013   Procedure: IRRIGATION AND DEBRIDEMENT RIGHT FOOT ULCER;  Surgeon: Renette Butters, MD;  Location: [facility];  Service: Orthopedics;  Laterality: Right;  . I&D EXTREMITY Right 09/26/2013   Procedure: Repeat IRRIGATION AND DEBRIDEMENT Right Foot Ulcer;  Surgeon: Rozanna Box, MD;  Location: [facility];  Service: Orthopedics;  Laterality: Right;  . MULTIPLE TOOTH EXTRACTIONS    . SKIN SPLIT GRAFT Right 08/10/2016  Procedure: SKIN GRAFT SPLIT THICKNESS RIGHT LEG;  Surgeon: Newt Minion, MD;  Location: [facility];  Service: Orthopedics;  Laterality: Right;  . STUMP REVISION Right 04/20/2015   Procedure: Revision Right Below Knee Amputation, Apply Wound VAC;  Surgeon: Newt Minion, MD;  Location: [facility];  Service: Orthopedics;  Laterality: Right;  . TONSILLECTOMY      Family History: Family History  Problem Relation Age of Onset  . Cancer Mother   . Peripheral vascular disease Father     Social History: Social History   Social History  . Marital status: Married    Spouse name: N/A  . Number of children: N/A  . Years of education: N/A   Social History Main Topics  . Smoking status: Former Smoker    Years: 20.00    Quit date: 05/10/2000  . Smokeless tobacco: Former Systems developer    Quit date: 02/22/2000  . Alcohol use No  . Drug use: No  . Sexual activity: Not Asked   Other Topics Concern  . None   Social History Narrative   ** Merged History Encounter **       Lives alone.     Allergies:  Allergies  Allergen Reactions  . No Known Allergies     Objective:    Vital Signs:   Temp:  [97.4 F (36.3 C)-98.3 F (36.8 C)] 97.9 F (36.6 C) (08/13 0810) Pulse Rate:  [61-76] 73 (08/13 0810) Resp:  [18-20] 18 (08/13 0810) BP: (118-144)/(81-101) 142/101 (08/13 0810) SpO2:  [95  %-97 %] 95 % (08/13 0810) Weight:  [227 lb 4.7 oz (103.1 kg)-228 lb 9.9 oz (103.7 kg)] 227 lb 4.7 oz (103.1 kg) (08/13 0443) Last BM Date: 10/14/16  Weight change: Filed Weights   10/14/16 1220 10/15/16 0443  Weight: 228 lb 9.9 oz (103.7 kg) 227 lb 4.7 oz (103.1 kg)    Intake/Output:   Intake/Output Summary (Last 24 hours) at 10/15/16 1205 Last data filed at 10/15/16 1037  Gross per 24 hour  Intake             1360 ml  Output             1350 ml  Net               10 ml      Physical Exam    General:  Chronically ill appearing. Appears older than stated age.  HEENT: Normal except for L eye blindness Neck: supple. JVP elevated to 12-14 cm. Carotids 2+ bilat; no bruits. No lymphadenopathy or thyromegaly appreciated. Cor: PMI nondisplaced. Regular rate & rhythm. +S3.  Lungs: Diminished basilar sounds. Abdomen: soft, nontender, nondistended. No hepatosplenomegaly. No bruits or masses. Good bowel sounds. Extremities: no cyanosis or clubbing. S/p R BKA.  Neuro: alert & orientedx3, cranial nerves grossly intact. moves all 4 extremities w/o difficulty. Affect flat  Telemetry   Personally reviewed, NSR with occasional PVCs  EKG    Personally reviewed, NSR 62 bpm with PVCs.   Labs   Basic Metabolic Panel:  Recent Labs Lab 10/14/16 0556 10/15/16 0336  NA 140 141  K 4.4 3.9  CL 107 104  CO2 25 29  GLUCOSE 111* 84  BUN 28* 28*  CREATININE 1.38* 1.38*  CALCIUM 8.4* 8.4*    Liver Function Tests:  Recent Labs Lab 10/15/16 0336  AST 18  ALT 19  ALKPHOS 252*  BILITOT 1.3*  PROT 5.7*  ALBUMIN 2.0*   No results for input(s): LIPASE, AMYLASE in the  last 168 hours. No results for input(s): AMMONIA in the last 168 hours.  CBC:  Recent Labs Lab 10/14/16 0556 10/15/16 0336  WBC 8.7 7.8  NEUTROABS 7.1  --   HGB 12.1* 11.4*  HCT 37.5* 35.1*  MCV 78.3 80.5  PLT 318 297    Cardiac Enzymes:  Recent Labs Lab 10/14/16 0556 10/14/16 1522 10/14/16 2115  10/15/16 0336  TROPONINI 0.04* 0.05* 0.04* 0.04*    BNP: BNP (last 3 results)  Recent Labs  05/24/16 1034 09/24/16 1407 10/14/16 0556  BNP 2,709.6* 3,276.9* >4,500.0*    ProBNP (last 3 results) No results for input(s): PROBNP in the last 8760 hours.   CBG:  Recent Labs Lab 10/14/16 1623 10/14/16 2100 10/15/16 0733 10/15/16 0803 10/15/16 1144  GLUCAP 121* 121* 29* 93 76    Coagulation Studies:  Recent Labs  10/14/16 1522 10/15/16 0336  LABPROT 17.2* 17.2*  INR 1.39 1.39     Imaging   Dg Chest 2 View  Result Date: 10/15/2016 CLINICAL DATA:  CHF EXAM: CHEST  2 VIEW COMPARISON:  Chest radiograph from one day prior. FINDINGS: Stable cardiomediastinal silhouette with mild enlargement of the cardiopericardial silhouette. No pneumothorax. Trace bilateral pleural effusions. Cephalization of the pulmonary vasculature without overt pulmonary edema. IMPRESSION: Stable enlargement of the cardiopericardial silhouette without overt pulmonary edema. Trace bilateral pleural effusions. Electronically Signed   By: Ilona Sorrel M.D.   On: 10/15/2016 07:40      Medications:     Current Medications: . atorvastatin  40 mg Oral Daily  . brimonidine  1 drop Left Eye BID  . carvedilol  12.5 mg Oral BID WC  . dorzolamide-timolol  1 drop Left Eye BID  . furosemide  80 mg Intravenous BID  . heparin  5,000 Units Subcutaneous Q8H  . insulin aspart  0-9 Units Subcutaneous TID WC  . potassium chloride SA  20 mEq Oral BID  . sacubitril-valsartan  1 tablet Oral BID  . tamsulosin  0.4 mg Oral QPC supper  . vitamin A  10,000 Units Oral Daily     Infusions:   Patient Profile   [name] is a 61 y.o. male with history of Chronic systolic CHF due to NICM, Echo 09/24/16 with LVEF 20-25%, HTN, DM2, PAD s/p R BKA, CKD stage 3, and history of medical non-compliance.   Admitted 10/14/16 from ED with scrotal edema and pain along with worsening peripheral edema and SOB.    Assessment/Plan   1. Acute on chronic systolic CHF.  - Echo 6/78/93 with LVEF 20-25%, Mod RAE, Mod/Sev TR, PA peak pressure 63 mmg Hg - NYHA III likely, but difficult to assess due to inability to wear his prothesis with poorly healing wound.  - Historically very non-compliant with his medications, and remains so per recent paramedicine visit.  - He is markedly volume overloaded on exam. Increase lasix to 80 mg IV BID. Add 2.5 mg metolazone.  - Continue 40 meq potassium daily.  - No spiro or digoxin with marked non compliance.  - Continue Coreg 12.5 mg BID for now.  - Continue Entresto 49/51 mg BID.   - He is NOT a candidate for advanced therapies with his co morbidities and gross non-compliance.  2. HTN - Meds as above.  3. DM2, poorly controlled - Per primary. Remains noncompliant with his meds.  4. PAD s/p R BKA - Follows with Dr. Sharol Given. Had recent skin graft placed.Remains poorly healing.  - Now multiple wounds on LLE as well. Concerning  for his overall prognosis.   5. CKD stage 3  - Follow closely with diuresis.  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   Markedly volume overloaded in the setting of marked non-compliance. Increase lasix as above. With bilateral limb wounds, he has a poor prognosis overall and is not candidate for advanced therapies.  He told pharmacy tech on admit that he has not taken any medications x 2 weeks.   Length of Stay: 1  Annamaria Helling  10/15/2016, 12:05 PM  Advanced Heart Failure Team Pager [phone] (M-F; 7a - 4p)  Please contact [facility] for night-coverage after hours (4p -7a ) and weekends on [email or web address]  Patient seen with PA, agree with the above note.  He has been noncompliant with home meds and was admitted with dyspnea and peripheral/scrotal edema.   On exam, he is volume overloaded with 1+ edema to thigh on left (s/p right BKA) and JVP 16 cm.    We will increase Lasix to 80 mg IV bid and  will give a dose of metolazone today.  Given poor compliance, will try to limit bid meds => stop Coreg, start bisoprolol 5 mg daily.  Suspect he will need 3 days or so of IV diuresis.   Loralie Champagne 10/15/2016 2:53 PM

## 2016-10-16 ENCOUNTER — Ambulatory Visit (INDEPENDENT_AMBULATORY_CARE_PROVIDER_SITE_OTHER): Payer: Medicaid Other | Admitting: Orthopedic Surgery

## 2016-10-16 DIAGNOSIS — E1142 Type 2 diabetes mellitus with diabetic polyneuropathy: Secondary | ICD-10-CM

## 2016-10-16 DIAGNOSIS — H5440 Blindness, one eye, unspecified eye: Secondary | ICD-10-CM

## 2016-10-16 DIAGNOSIS — E1165 Type 2 diabetes mellitus with hyperglycemia: Secondary | ICD-10-CM

## 2016-10-16 DIAGNOSIS — Z794 Long term (current) use of insulin: Secondary | ICD-10-CM

## 2016-10-16 LAB — BASIC METABOLIC PANEL
Anion gap: 8 (ref 5–15)
BUN: 31 mg/dL — ABNORMAL HIGH (ref 6–20)
CO2: 28 mmol/L (ref 22–32)
Calcium: 8.2 mg/dL — ABNORMAL LOW (ref 8.9–10.3)
Chloride: 103 mmol/L (ref 101–111)
Creatinine, Ser: 1.29 mg/dL — ABNORMAL HIGH (ref 0.61–1.24)
GFR calc Af Amer: >60 mL/min (ref >60)
GFR calc non Af Amer: 59 mL/min — ABNORMAL LOW (ref >60)
Glucose, Bld: 83 mg/dL (ref 65–99)
Potassium: 4.1 mmol/L (ref 3.5–5.1)
Sodium: 139 mmol/L (ref 135–145)

## 2016-10-16 LAB — GLUCOSE, CAPILLARY
Glucose-Capillary: 88 mg/dL (ref 65–99)
Glucose-Capillary: 130 mg/dL — ABNORMAL HIGH (ref 65–99)
Glucose-Capillary: 168 mg/dL — ABNORMAL HIGH (ref 65–99)
Glucose-Capillary: 123 mg/dL — ABNORMAL HIGH (ref 65–99)

## 2016-10-16 LAB — MAGNESIUM: Magnesium: 1.6 mg/dL — ABNORMAL LOW (ref 1.7–2.4)

## 2016-10-16 LAB — CBC
HCT: 36.7 % — ABNORMAL LOW (ref 39.0–52.0)
Hemoglobin: 11.6 g/dL — ABNORMAL LOW (ref 13.0–17.0)
MCH: 25.6 pg — ABNORMAL LOW (ref 26.0–34.0)
MCHC: 31.6 g/dL (ref 30.0–36.0)
MCV: 81 fL (ref 78.0–100.0)
Platelets: 322 10*3/uL (ref 150–400)
RBC: 4.53 MIL/uL (ref 4.22–5.81)
RDW: 20.2 % — ABNORMAL HIGH (ref 11.5–15.5)
WBC: 5.9 10*3/uL (ref 4.0–10.5)

## 2016-10-16 MED ORDER — METOLAZONE 2.5 MG PO TABS
2.5000 mg | ORAL_TABLET | Freq: Every day | ORAL | Status: DC
  Administered 2016-10-16 – 2016-10-18 (×3): 2.5 mg via ORAL
  Filled 2016-10-16 (×3): qty 1

## 2016-10-16 MED ORDER — MAGNESIUM SULFATE 4 GM/100ML IV SOLN
4.0000 g | Freq: Once | INTRAVENOUS | Status: AC
  Administered 2016-10-16: 4 g via INTRAVENOUS
  Filled 2016-10-16: qty 100

## 2016-10-16 MED ORDER — FUROSEMIDE 10 MG/ML IJ SOLN
80.0000 mg | Freq: Three times a day (TID) | INTRAMUSCULAR | Status: DC
  Administered 2016-10-16 – 2016-10-18 (×6): 80 mg via INTRAVENOUS
  Filled 2016-10-16 (×6): qty 8

## 2016-10-16 NOTE — Progress Notes (Signed)
PROGRESS NOTE   [name]  UTM:[id number]    DOB: Jul 08, 1955    DOA: 10/14/2016  PCP: Steve Rattler, DO   I have briefly reviewed patients [facility].  Brief Narrative:  61 year old male, wheelchair mobile, with PMH of HTN, DM, PAD status post right BKA, chronic systolic CHF due to nonischemic cardiomyopathy, 2-D echo 07/2015: LVEF 15%, stage III chronic kidney disease, blindness, multiple wounds, severe noncompliance with medications and diet (also noted by home health RN during visits), presented to the ED on 10/14/16 with increasing lower extremity and scrotal swelling, difficulty urinating, dyspnea on exertion without cough, chest pain or palpitations. Increased abdominal swelling area did decrease appetite. Admitted for decompensated CHF. Cardiology consulted 10/15/16.    Assessment & Plan:   Active Problems:   DM (diabetes mellitus), type 2, uncontrolled (HCC)   Obesity, unspecified   Essential hypertension, benign   Noncompliance with medications   Diabetic neuropathy associated with type 2 diabetes mellitus ([place])   Blindness of left eye   History of right below knee amputation ([place])   PAD (peripheral artery disease) (H[facility])   Pressure ulcer   Congestive dilated cardiomyopathy (HCC)   Chronic kidney disease (CKD), stage III (moderate)   Scrotal edema   Multiple open wounds of lower leg, initial encounter   Acute on chronic combined systolic and diastolic CHF (congestive heart failure) (HCC)   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 Acute on chronic systolic CHF/NICM: 2-D echo 09/24/16: LVEF 20-25 percent, diffuse hypokinesis. As per home EMT visits in chart review, non-compliant with diet and medications. BMP on admission >4500. He started IV Lasix 60 mg twice a day. - 1400ML yesterday. Still has significant volume overload. Continue current lasix IV 80 mg every 8  hourly and metolazone added by cardiology. Continue carvedilol, Sacubitril-Valsartan. TSH: 1.216. Cardiology input appreciated. Continue current management. Still severely volume overloaded. 2. Acute on stage II chronic kidney disease: Baseline creatinine in the 1-1.1 and drainage. Presented with creatinine of 1.38 likely due to poor perfusion/cardiorenal syndrome and medications. Stable compared to yesterday. Follow creatinine closely while being diuresed. Creatinine better at 1.29. 3. Anasarca: Secondary to decompensated CHF, renal insufficiency and hypoalbuminemia. Management as above. Dietitian consultation. 4. Essential hypertension: Mildly uncontrolled. Continue current management and follow. 5. Hyperlipidemia: Statins. 6. Elevated troponin: Mild and flat trend. No chest pain. Likely secondary to demand ischemia from decompensated CHF and renal insufficiency. 7. Normocytic anemia: No bleeding reported. Stable. 8. Type II DM/hypoglycemia: DC Lantus. As per nursing, refusing to consistently eat hospital food which she does not like. Increased oral intake. Continue SSI. Monitor closely. CBGs controlled without further hypoglycemia. A1C: 7.2 9. Multiple wounds: [facility] RN input 8/12 appreciated and management per their recommendations. Follow-up with Dr. Sharol Given as outpatient. Patient has a large wound below the knee on the anterior aspect of his right proximal leg/stump. As per discussion with Dr. Sharol Given, patient apparently crawls at home causing this wound not heal. Needs to avoid crawling. 10. Noncompliance: Counseled extensively regarding importance of compliance with all aspects of medical care. 11. PAD status post right BKA 12. Blindness: Completely blinded left eye and significantly impaired visual acuity in the right eye. 13. Adult failure to thrive, multifactorial. 14. Hypomagnesemia: Replace and follow.   DVT prophylaxis:  Subcutaneous heparin Code Status:  Full Family Communication:  None at  bedside Disposition:  To be determined pending medical improvement  and PT evaluation. ? SNF Vs home if patient refuses   Consultants:  Cardiology   Procedures:  None  Antimicrobials:  None    Subjective: Denies dyspnea or chest pain. Leg swelling improving. No other complaints reported.  ROS:  No dizziness, lightheadedness, cough. No further difficulty urinating or dysuria.  Objective:  Vitals:   10/15/16 0810 10/15/16 1230 10/15/16 1922 10/16/16 0451  BP: (!) 142/101 (!) 132/91 126/89 124/82  Pulse: 73 76 65 62  Resp: 18 18 18 18   Temp: 97.9 F (36.6 C) 98 F (36.7 C) 97.7 F (36.5 C) 98.2 F (36.8 C)  TempSrc: Oral Oral Oral Oral  SpO2: 95% 96% 99% 96%  Weight:    101.8 kg (224 lb 6.4 oz)  Height:        Examination:  General exam: Pleasant middle-aged male, moderately built and nourished, sitting up comfortably in bed eating breakfast. Eyes: Corneal opacity in left eye with total visual loss in that eye. Right eye with perception of hand movements only. Respiratory system:  Reduced breath sounds in the bases with occasional basal crackles. Rest of lung fields clear to auscultation. Respiratory effort normal. No significant change. Cardiovascular system: S1 & S2 heard, RRR. No JVD, murmurs, rubs, gallops or clicks.  2+ pitting leg edema on left and right thigh/AKA stump. Telemetry sinus rhythm. Stable without change. Gastrointestinal system: Abdomen is nondistended, soft and nontender. No organomegaly or masses felt. Normal bowel sounds heard. Central nervous system: Alert and oriented. No focal neurological deficits. Extremities: Symmetric 5 x 5 power. Skin:  Multiple skin wounds, evaluation as per [place] [facility] RN note on 8/12 and these have been dressed. Large wound below the knee on the anterior aspect of his right proximal leg/stump. Una boot on left leg. Psychiatry: Judgement and insight appear normal. Mood & affect appropriate.     Data Reviewed: I have personally  reviewed following labs and imaging studies  CBC:  Recent Labs Lab 10/14/16 0556 10/15/16 0336 10/16/16 0230  WBC 8.7 7.8 5.9  NEUTROABS 7.1  --   --   HGB 12.1* 11.4* 11.6*  HCT 37.5* 35.1* 36.7*  MCV 78.3 80.5 81.0  PLT 318 297 938   Basic Metabolic Panel:  Recent Labs Lab 10/14/16 0556 10/15/16 0336 10/16/16 0230  NA 140 141 139  K 4.4 3.9 4.1  CL 107 104 103  CO2 25 29 28   GLUCOSE 111* 84 83  BUN 28* 28* 31*  CREATININE 1.38* 1.38* 1.29*  CALCIUM 8.4* 8.4* 8.2*  MG  --   --  1.6*   Liver Function Tests:  Recent Labs Lab 10/15/16 0336  AST 18  ALT 19  ALKPHOS 252*  BILITOT 1.3*  PROT 5.7*  ALBUMIN 2.0*   Coagulation Profile:  Recent Labs Lab 10/14/16 1522 10/15/16 0336  INR 1.39 1.39   Cardiac Enzymes:  Recent Labs Lab 10/14/16 0556 10/14/16 1522 10/14/16 2115 10/15/16 0336  TROPONINI 0.04* 0.05* 0.04* 0.04*   HbA1C:  Recent Labs  10/14/16 1522  HGBA1C 7.2*   CBG:  Recent Labs Lab 10/15/16 1144 10/15/16 1642 10/15/16 2100 10/16/16 0730 10/16/16 1130  GLUCAP 76 84 107* 88 130*    Recent Results (from the past 240 hour(s))  Urine culture     Status: Abnormal   Collection Time: 10/14/16 11:33 AM  Result Value Ref Range Status   Specimen Description URINE, CLEAN CATCH  Final   Special Requests NONE  Final   Culture MULTIPLE SPECIES PRESENT, SUGGEST RECOLLECTION (A)  Final   Report Status 10/15/2016 FINAL  Final  Culture, blood (Routine X 2) w Reflex to ID Panel     Status: None (Preliminary result)   Collection Time: 10/14/16  3:35 PM  Result Value Ref Range Status   Specimen Description BLOOD RIGHT ARM  Final   Special Requests IN PEDIATRIC BOTTLE Blood Culture adequate volume  Final   Culture NO GROWTH 1 DAY  Final   Report Status PENDING  Incomplete  Culture, blood (Routine X 2) w Reflex to ID Panel     Status: None (Preliminary result)   Collection Time: 10/14/16  3:35 PM  Result Value Ref Range Status   Specimen  Description BLOOD RIGHT WRIST  Final   Special Requests IN PEDIATRIC BOTTLE Blood Culture adequate volume  Final   Culture NO GROWTH 1 DAY  Final   Report Status PENDING  Incomplete         Radiology Studies: Dg Chest 2 View  Result Date: 10/15/2016 CLINICAL DATA:  CHF EXAM: CHEST  2 VIEW COMPARISON:  Chest radiograph from one day prior. FINDINGS: Stable cardiomediastinal silhouette with mild enlargement of the cardiopericardial silhouette. No pneumothorax. Trace bilateral pleural effusions. Cephalization of the pulmonary vasculature without overt pulmonary edema. IMPRESSION: Stable enlargement of the cardiopericardial silhouette without overt pulmonary edema. Trace bilateral pleural effusions. Electronically Signed   By: Ilona Sorrel M.D.   On: 10/15/2016 07:40        Scheduled Meds: . atorvastatin  40 mg Oral Daily  . bisoprolol  5 mg Oral Daily  . brimonidine  1 drop Left Eye BID  . dorzolamide-timolol  1 drop Left Eye BID  . furosemide  80 mg Intravenous Q8H  . heparin  5,000 Units Subcutaneous Q8H  . insulin aspart  0-9 Units Subcutaneous TID WC  . metolazone  2.5 mg Oral Daily  . potassium chloride SA  20 mEq Oral BID  . sacubitril-valsartan  1 tablet Oral BID  . tamsulosin  0.4 mg Oral QPC supper  . vitamin A  10,000 Units Oral Daily   Continuous Infusions: . magnesium sulfate 1 - 4 g bolus IVPB Stopped (10/16/16 1200)     LOS: 2 days     Lucyann Romano, MD, FACP, FHM. [facility] Pager [phone]  If 7PM-7AM, please contact night-coverage [email or web address] Password [facility] 10/16/2016, 11:46 AM

## 2016-10-16 NOTE — Clinical Social Work Note (Signed)
CSW met with patient to discuss SNF placement. No supports at bedside. Discussed Medicaid barriers: have to stay a minimum of 30 days for Medicaid to pay, SNF has to absorb therapy costs due to Medicaid not paying for therapies so it limits bed options, and his Medicaid check minus $30 goes to the facility. Patient agreeable to SNF but states he cannot afford to lose his check. Patient states he has someone come to his home 3 times per week that cleans, cooks, and an occasional bath. He has two sisters, one of which he sees almost daily and the other he sees once per month. The sister he sees once per month is his payee for his SSI check. RNCM notified.  CSW signing off. Consult again if any other social work needs arise.  Dayton Scrape, [place]

## 2016-10-16 NOTE — Progress Notes (Signed)
Advanced Heart Failure Rounding Note  PCP:  Primary Cardiologist: Dr Aundra Dubin   Subjective:    Admitted with marked volume overload in the setting of medication noncompliance.  Diuresing with IV lasix + metolazone. Weight down another 3 pounds.   Complaining of cough. Denies dyspnea at rest.    Objective:   Weight Range: 224 lb 6.4 oz (101.8 kg) Body mass index is 30.43 kg/m.   Vital Signs:   Temp:  [97.7 F (36.5 C)-98.2 F (36.8 C)] 98.2 F (36.8 C) (08/14 0451) Pulse Rate:  [62-76] 62 (08/14 0451) Resp:  [18] 18 (08/14 0451) BP: (124-132)/(82-91) 124/82 (08/14 0451) SpO2:  [96 %-99 %] 96 % (08/14 0451) Weight:  [224 lb 6.4 oz (101.8 kg)] 224 lb 6.4 oz (101.8 kg) (08/14 0451) Last BM Date: 10/14/16  Weight change: Filed Weights   10/14/16 1220 10/15/16 0443 10/16/16 0451  Weight: 228 lb 9.9 oz (103.7 kg) 227 lb 4.7 oz (103.1 kg) 224 lb 6.4 oz (101.8 kg)    Intake/Output:   Intake/Output Summary (Last 24 hours) at 10/16/16 0826 Last data filed at 10/16/16 0739  Gross per 24 hour  Intake              775 ml  Output             1950 ml  Net            -1175 ml      Physical Exam    General:  Appears fatigue.  No resp difficulty HEENT: normal Neck: supple. JVP to jaw . Carotids 2+ bilat; no bruits. No lymphadenopathy or thryomegaly appreciated. Cor: PMI nondisplaced. Regular rate & rhythm. No rubs, gallops or murmurs. Lungs: clear Abdomen: soft, nontender, nondistended. No hepatosplenomegaly. No bruits or masses. Good bowel sounds. Extremities: no cyanosis, clubbing, rash, LLE unna boot. R BKA 1+ edema.  Neuro: alert & orientedx3, cranial nerves grossly intact. moves all 4 extremities w/o difficulty. Affect pleasant   Telemetry   NSR 70s with occasional PVCs. personally reviewed.   EKG   Admit EKG. NSR 71 bpm personally reviewed.  Labs    CBC  Recent Labs  10/14/16 0556 10/15/16 0336 10/16/16 0230  WBC 8.7 7.8 5.9  NEUTROABS 7.1  --   --     HGB 12.1* 11.4* 11.6*  HCT 37.5* 35.1* 36.7*  MCV 78.3 80.5 81.0  PLT 318 297 557   Basic Metabolic Panel  Recent Labs  10/15/16 0336 10/16/16 0230  NA 141 139  K 3.9 4.1  CL 104 103  CO2 29 28  GLUCOSE 84 83  BUN 28* 31*  CREATININE 1.38* 1.29*  CALCIUM 8.4* 8.2*  MG  --  1.6*   Liver Function Tests  Recent Labs  10/15/16 0336  AST 18  ALT 19  ALKPHOS 252*  BILITOT 1.3*  PROT 5.7*  ALBUMIN 2.0*   No results for input(s): LIPASE, AMYLASE in the last 72 hours. Cardiac Enzymes  Recent Labs  10/14/16 1522 10/14/16 2115 10/15/16 0336  TROPONINI 0.05* 0.04* 0.04*    BNP: BNP (last 3 results)  Recent Labs  05/24/16 1034 09/24/16 1407 10/14/16 0556  BNP 2,709.6* 3,276.9* >4,500.0*    ProBNP (last 3 results) No results for input(s): PROBNP in the last 8760 hours.   D-Dimer No results for input(s): DDIMER in the last 72 hours. Hemoglobin A1C  Recent Labs  10/14/16 1522  HGBA1C 7.2*   Fasting Lipid Panel No results for input(s): CHOL, HDL, LDLCALC,  TRIG, CHOLHDL, LDLDIRECT in the last 72 hours. Thyroid Function Tests  Recent Labs  10/14/16 1522  TSH 1.216    Other results:   Imaging     No results found.   Medications:     Scheduled Medications: . atorvastatin  40 mg Oral Daily  . bisoprolol  5 mg Oral Daily  . brimonidine  1 drop Left Eye BID  . dorzolamide-timolol  1 drop Left Eye BID  . furosemide  80 mg Intravenous BID  . heparin  5,000 Units Subcutaneous Q8H  . insulin aspart  0-9 Units Subcutaneous TID WC  . potassium chloride SA  20 mEq Oral BID  . sacubitril-valsartan  1 tablet Oral BID  . tamsulosin  0.4 mg Oral QPC supper  . vitamin A  10,000 Units Oral Daily     Infusions: . magnesium sulfate 1 - 4 g bolus IVPB       PRN Medications:  acetaminophen, bisacodyl, hydrALAZINE, ondansetron **OR** ondansetron (ZOFRAN) IV, oxyCODONE-acetaminophen, senna-docusate    Patient Profile   [name] is a  61 y.o. male with history of Chronic systolic CHF due to NICM, Echo 09/24/16 with LVEF 20-25%, HTN, DM2, PAD s/p R BKA, CKD stage 3, and history of medical non-compliance.   Admitted 10/14/16 from ED with scrotal edema and pain along with worsening peripheral edema and SOB  Assessment/Plan   1. Acute on chronic systolic CHF.  - Echo 8/85/02 with LVEF 20-25%, Mod RAE, Mod/Sev TR, PA peak pressure 63 mmg Hg - Historically very non-compliant with his medications, and remains so per recent paramedicine visit.  -Increase lasix to 80 mg three times a day and continue 2. 5mg  metolazone daily.   - Continue 40 meq potassium daily.  - No spiro or digoxin with  non compliance.  - Continue Coreg 12.5 mg BID for now.  - Continue Entresto 49/51 mg BID.   - He is NOT a candidate for advanced therapies with his co morbidities and non-compliance.  2. HTN - Stable   3. DM2, poorly controlled - Per primary. Remains noncompliant with his meds.  4. PAD s/p R BKA - Follows with Dr. Sharol Given. Had recent skin graft placed.Remains poorly healing.  -WOC evaluated. Unna boot to RLE with twice weekly dressing changes. .   5. CKD stage 3  - Creatinine 1.3.   6. Social - Continue paramedicine. Pt has chronic non-compliance.  7. Deconditioning- PT/OT recommending SNF. He refuses. Will need [facility]  He would benefit from Palliative Care for [place] given ongoing noncompliance and multiple issues.    Length of Stay: 2   Darrick Grinder, NP  10/16/2016, 8:26 AM  Advanced Heart Failure Team Pager [phone] (M-F; 7a - 4p)  Please contact [facility] for night-coverage after hours (4p -7a ) and weekends on [email or web address]  Patient seen and examined with Darrick Grinder, NP. We discussed all aspects of the encounter. I agree with the assessment and plan as stated above.   He remains markedly volume overloaded. Agree with increasing lasix and adding metolazone. K and renal function stable.   R stump wound not healing well. Likely  complicated by edema. Appreciaate WOC recommendations. UNNA boots placed.   Non-compliance remains an ongoing challenge and will no doubt contribute to his increased morbidity/mortality risk.   Glori Bickers, MD  9:09 AM

## 2016-10-16 NOTE — Care Management Note (Addendum)
Case Management Note  Patient Details  Name: [name] MRN: [id number] Date of Birth: 1955-08-05  Subjective/Objective:  DM, obesity, HTN, noncompliance with medication, CHF, CKD              Action/Plan: Discharge Planning: NCM spoke to pt and discussed noncompliance with medication and heart healthy diabetic diet. States he is blind and meet with challenges. Explained [facility] will not be able to accept referral. [facility] will reevaluate for [facility] care. Pt has shower chair, bedside commode, wheelchair, RW and Cane.   His sister, Laurena Spies is his SS check payee and pays his bills. His sister Danton Clap assist with meals and taking him to appts. He is not set up with [facility], pt states due to blindness. Has a personal care assistant that comes MWF 2-6 hours per week coming from [facility]. . Will see if [facility] SW can assist pt with getting more hours for [facility] Medicaid PCS. Contacted [facility] and hours were verified. NCM discussed with [facility] Director more hours with program. States she will fax form to request additional hours to PCP's office. Pt is refusing SNF and wants to return home.    Will need HH RN, Aide and SW orders with [facility].   PCP Lucila Maine C MD  10/17/2016 1058 NCM contacted [facility] with new referral and they can accept referral. Spoke to sister, Alice via phone to update. States she assist pt in the home but she works. She takes him to his appts. She will work with him on the medications to ensure he is taking them at home. Updated attending.   10/17/2016 1123 Received call from [facility] rep, and pt is open with their agency. And they plan to keep referral. Contacted [facility] to cancel referral. Provided [facility] with sister's contact information.   Expected Discharge Date:  10/17/16               Expected Discharge Plan:  [place]  In-House Referral:  Clinical Social Work  Discharge planning Services  CM Consult  Post Acute Care Choice:  Home Health Choice  offered to:  Patient  DME Arranged:  N/A DME Agency:  NA  HH Arranged:  RN, Nurse's Aide, Social Work [employer] Agency:  [place]  Status of Service:  In process, will continue to follow  If discussed at Long Length of Stay Meetings, dates discussed:    Additional Comments:  Erenest Rasher, RN 10/16/2016, 12:11 PM

## 2016-10-16 NOTE — Progress Notes (Signed)
Pt slept well during the night, Vitals stable, no any sign of SOB and distress noted, no any complain of pain, will continue to monitor the patient. 

## 2016-10-17 ENCOUNTER — Telehealth (INDEPENDENT_AMBULATORY_CARE_PROVIDER_SITE_OTHER): Payer: Self-pay | Admitting: Orthopedic Surgery

## 2016-10-17 DIAGNOSIS — E1149 Type 2 diabetes mellitus with other diabetic neurological complication: Secondary | ICD-10-CM

## 2016-10-17 DIAGNOSIS — I739 Peripheral vascular disease, unspecified: Secondary | ICD-10-CM

## 2016-10-17 LAB — MAGNESIUM: Magnesium: 2 mg/dL (ref 1.7–2.4)

## 2016-10-17 LAB — BASIC METABOLIC PANEL
Anion gap: 9 (ref 5–15)
BUN: 29 mg/dL — ABNORMAL HIGH (ref 6–20)
CO2: 31 mmol/L (ref 22–32)
Calcium: 8.4 mg/dL — ABNORMAL LOW (ref 8.9–10.3)
Chloride: 99 mmol/L — ABNORMAL LOW (ref 101–111)
Creatinine, Ser: 1.34 mg/dL — ABNORMAL HIGH (ref 0.61–1.24)
GFR calc Af Amer: >60 mL/min (ref >60)
GFR calc non Af Amer: 56 mL/min — ABNORMAL LOW (ref >60)
Glucose, Bld: 80 mg/dL (ref 65–99)
Potassium: 4 mmol/L (ref 3.5–5.1)
Sodium: 139 mmol/L (ref 135–145)

## 2016-10-17 LAB — GLUCOSE, CAPILLARY
Glucose-Capillary: 84 mg/dL (ref 65–99)
Glucose-Capillary: 113 mg/dL — ABNORMAL HIGH (ref 65–99)
Glucose-Capillary: 129 mg/dL — ABNORMAL HIGH (ref 65–99)
Glucose-Capillary: 159 mg/dL — ABNORMAL HIGH (ref 65–99)

## 2016-10-17 MED ORDER — BACITRACIN ZINC 500 UNIT/GM EX OINT
TOPICAL_OINTMENT | Freq: Two times a day (BID) | CUTANEOUS | Status: DC
  Administered 2016-10-17 – 2016-10-18 (×2): via TOPICAL
  Filled 2016-10-17: qty 28.35

## 2016-10-17 MED ORDER — SACUBITRIL-VALSARTAN 97-103 MG PO TABS
1.0000 | ORAL_TABLET | Freq: Two times a day (BID) | ORAL | Status: DC
  Administered 2016-10-17 – 2016-10-18 (×3): 1 via ORAL
  Filled 2016-10-17 (×4): qty 1

## 2016-10-17 NOTE — Progress Notes (Signed)
Advanced Heart Failure Rounding Note  PCP:  Primary Cardiologist: Dr Aundra Dubin   Subjective:    Admitted with marked volume overload in the setting of medication noncompliance.  Diuresing with IV lasix + metolazone.   Yesterday lasix was increased to 80 mg three times a day. Weight down another 6 pounds.   Feeling better. Denies SOB.     Objective:   Weight Range: 218 lb 9.6 oz (99.2 kg) Body mass index is 29.65 kg/m.   Vital Signs:   Temp:  [97.3 F (36.3 C)-97.6 F (36.4 C)] 97.4 F (36.3 C) (08/15 0400) Pulse Rate:  [64-77] 77 (08/15 0400) Resp:  [18-20] 18 (08/15 0400) BP: (127-136)/(94-98) 127/94 (08/15 0400) SpO2:  [94 %-98 %] 98 % (08/15 0400) Weight:  [218 lb 9.6 oz (99.2 kg)] 218 lb 9.6 oz (99.2 kg) (08/15 0400) Last BM Date: 10/14/16  Weight change: Filed Weights   10/15/16 0443 10/16/16 0451 10/17/16 0400  Weight: 227 lb 4.7 oz (103.1 kg) 224 lb 6.4 oz (101.8 kg) 218 lb 9.6 oz (99.2 kg)    Intake/Output:   Intake/Output Summary (Last 24 hours) at 10/17/16 0709 Last data filed at 10/17/16 0356  Gross per 24 hour  Intake             1420 ml  Output             4000 ml  Net            -2580 ml      Physical Exam    General:  Well appearing. No resp difficulty. Sitting up in the bed.  HEENT: normal Neck: supple. JVP ~10  Carotids 2+ bilat; no bruits. No lymphadenopathy or thryomegaly appreciated. Cor: PMI nondisplaced. Regular rate & rhythm. No rubs, gallops or murmurs. Lungs: clear Abdomen: soft, nontender, nondistended. No hepatosplenomegaly. No bruits or masses. Good bowel sounds. Extremities: no cyanosis, clubbing, rash, LLE unna boot. RBKA foam dressing. 1+ edema  Neuro: alert & orientedx3, cranial nerves grossly intact. moves all 4 extremities w/o difficulty. Affect pleasant    Telemetry   NSR 70s personally reviewed.  EKG   Admit EKG. NSR 71 bpm personally reviewed.  Labs    CBC  Recent Labs  10/15/16 0336 10/16/16 0230  WBC  7.8 5.9  HGB 11.4* 11.6*  HCT 35.1* 36.7*  MCV 80.5 81.0  PLT 297 209   Basic Metabolic Panel  Recent Labs  10/16/16 0230 10/17/16 0343  NA 139 139  K 4.1 4.0  CL 103 99*  CO2 28 31  GLUCOSE 83 80  BUN 31* 29*  CREATININE 1.29* 1.34*  CALCIUM 8.2* 8.4*  MG 1.6* 2.0   Liver Function Tests  Recent Labs  10/15/16 0336  AST 18  ALT 19  ALKPHOS 252*  BILITOT 1.3*  PROT 5.7*  ALBUMIN 2.0*   No results for input(s): LIPASE, AMYLASE in the last 72 hours. Cardiac Enzymes  Recent Labs  10/14/16 1522 10/14/16 2115 10/15/16 0336  TROPONINI 0.05* 0.04* 0.04*    BNP: BNP (last 3 results)  Recent Labs  05/24/16 1034 09/24/16 1407 10/14/16 0556  BNP 2,709.6* 3,276.9* >4,500.0*    ProBNP (last 3 results) No results for input(s): PROBNP in the last 8760 hours.   D-Dimer No results for input(s): DDIMER in the last 72 hours. Hemoglobin A1C  Recent Labs  10/14/16 1522  HGBA1C 7.2*   Fasting Lipid Panel No results for input(s): CHOL, HDL, LDLCALC, TRIG, CHOLHDL, LDLDIRECT in the last 72  hours. Thyroid Function Tests  Recent Labs  10/14/16 1522  TSH 1.216    Other results:   Imaging    No results found.   Medications:     Scheduled Medications: . atorvastatin  40 mg Oral Daily  . bacitracin   Topical BID  . bisoprolol  5 mg Oral Daily  . brimonidine  1 drop Left Eye BID  . dorzolamide-timolol  1 drop Left Eye BID  . furosemide  80 mg Intravenous Q8H  . heparin  5,000 Units Subcutaneous Q8H  . insulin aspart  0-9 Units Subcutaneous TID WC  . metolazone  2.5 mg Oral Daily  . potassium chloride SA  20 mEq Oral BID  . sacubitril-valsartan  1 tablet Oral BID  . tamsulosin  0.4 mg Oral QPC supper  . vitamin A  10,000 Units Oral Daily    Infusions:   PRN Medications: acetaminophen, bisacodyl, hydrALAZINE, ondansetron **OR** ondansetron (ZOFRAN) IV, oxyCODONE-acetaminophen, senna-docusate    Patient Profile   [name] is a  61 y.o. male with history of Chronic systolic CHF due to NICM, Echo 09/24/16 with LVEF 20-25%, HTN, DM2, PAD s/p R BKA, CKD stage 3, and history of medical non-compliance.   Admitted 10/14/16 from ED with scrotal edema and pain along with worsening peripheral edema and SOB  Assessment/Plan   1. Acute on chronic systolic CHF.  - Echo 2/37/62 with LVEF 20-25%, Mod RAE, Mod/Sev TR, PA peak pressure 63 mmg Hg - Historically very non-compliant with his medications, and remains so per recent paramedicine visit.  -Volume status improving. Continue  lasix to 80 mg three times a day and continue 2. 5mg  metolazone daily.   Hopefully transition to po tomorrow. Renal function stable.  - Continue 40 meq potassium daily.  - No spiro or digoxin with  non compliance.  -  Continue bisoprolol 5 mg daily.  - Increase entresto 97-103 mg BID.   - He is NOT a candidate for advanced therapies with his co morbidities and non-compliance.  2. HTN - Elevated. Increase entresto as above.  3. DM2, poorly controlled - Per primary. Remains noncompliant with his meds.  4. PAD s/p R BKA - Follows with Dr. Sharol Given. Had recent skin graft placed.Remains poorly healing.  -WOC evaluated. Unna boot to RLE with twice weekly dressing changes. .   5. CKD stage 3  - Creatinine 1.3.   6. Social - Continue paramedicine. Pt has chronic non-compliance.  7. Deconditioning- PT/OT recommending SNF. HE is willing to go to Rehab/SNF if insurance will cover.   He would benefit from Palliative Care for [place] given ongoing noncompliance and multiple issues. IN the community he has been followed closely by HF Paramedicine. Will need to continue if he does not go to SNF. Today he says he is willing to go to SNF/Rehab if insurance covers.    Length of Stay: 3   Amy Clegg, NP  10/17/2016, 7:09 AM  Advanced Heart Failure Team Pager [phone] (M-F; 7a - 4p)  Please contact [facility] for night-coverage after hours (4p -7a ) and weekends on  [email or web address]  Patient seen and examined with Darrick Grinder, NP. We discussed all aspects of the encounter. I agree with the assessment and plan as stated above.   Volume status much improved. Continue IV lasix one more day. Switch to po in am. Renal function ok. Will need paramedicine on d/c.   Glori Bickers, MD  2:09 PM

## 2016-10-17 NOTE — Telephone Encounter (Signed)
Patient's sister Danton Clap called advised patient has been in the hospital since Sunday. Patient is in [facility].  The number to contact [place] is [phone]

## 2016-10-17 NOTE — Progress Notes (Addendum)
Pt with bilateral knee wounds and wound care orders to keep wounds open to air after bacitracin application. However, left knee wound started bleeding due to patient scratching it. Foam applied to left knee wound.      Pt has pulled left knee foam off. Site not actively bleeding. Will continue to monitor.

## 2016-10-17 NOTE — Telephone Encounter (Signed)
FYI, let me know if we need to do anything?  Thanks

## 2016-10-17 NOTE — Progress Notes (Signed)
Physical Therapy Treatment Patient Details Name: [name] MRN: [id number] DOB: December 06, 1955 Today's Date: 10/17/2016    History of Present Illness  KEVRON Tucker is a 61 y.o. male with multiple medical comorbid disease, including poorly controlled diabetes, chronic systolic heart failure with known EF of 20 to 25%, peripheral vascular disease, status post right BKA, with poor healing one on the right BKA stump, followed by Dr. Sharol Given, presenting today to the hospital, with increasing scrotal edema and pain in scrotum.     PT Comments    Patient tolerated transfer OOB and therex well. Pt required min guard for safety with lateral scoot transfer and continues to report scrotal pain with mobility. Will need +2 assist for attempt to stand. Current plan remains appropriate.   Follow Up Recommendations  SNF     Equipment Recommendations  None recommended by PT    Recommendations for Other Services       Precautions / Restrictions Precautions Precautions: Fall Precaution Comments: significant scrotal edema Restrictions Other Position/Activity Restrictions: Right BKA, bilat knees and L foot wounds    Mobility  Bed Mobility Overal bed mobility: Needs Assistance Bed Mobility: Supine to Sit;Rolling Rolling: Modified independent (Device/Increase time)   Supine to sit: Supervision     General bed mobility comments: use of rails to roll and increased time and effort to get into sitting  Transfers Overall transfer level: Needs assistance Equipment used: None Transfers: Lateral/Scoot Transfers          Lateral/Scoot Transfers: Min guard General transfer comment: min guard for safety; use of pillowcase for scrotal sling  Ambulation/Gait             General Gait Details: unable   Stairs            Wheelchair Mobility    Modified Rankin (Stroke Patients Only)       Balance Overall balance assessment: Needs assistance Sitting-balance support: Feet  supported;No upper extremity supported Sitting balance-Leahy Scale: Fair                                      Cognition Arousal/Alertness: Awake/alert Behavior During Therapy: WFL for tasks assessed/performed Overall Cognitive Status: Within Functional Limits for tasks assessed                                        Exercises General Exercises - Lower Extremity Long Arc Quad: AROM;Both;20 reps Amputee Exercises Chair Push Up: AROM;10 reps    General Comments General comments (skin integrity, edema, etc.): L eye legally blind; acuity deficits in R eye      Pertinent Vitals/Pain Pain Assessment: Faces Faces Pain Scale: Hurts even more Pain Location: scrotum - during mobililty  Pain Descriptors / Indicators: Moaning;Grimacing;Guarding Pain Intervention(s): Monitored during session;Repositioned    Home Living                      Prior Function            PT Goals (current goals can now be found in the care plan section) Acute Rehab PT Goals Patient Stated Goal: Return home PT Goal Formulation: With patient Time For Goal Achievement: 10/29/16 Potential to Achieve Goals: Good Progress towards PT goals: Progressing toward goals    Frequency    Min 3X/week  PT Plan Current plan remains appropriate    Co-evaluation              AM-PAC PT "6 Clicks" Daily Activity  Outcome Measure  Difficulty turning over in bed (including adjusting bedclothes, sheets and blankets)?: None Difficulty moving from lying on back to sitting on the side of the bed? : A Lot Difficulty sitting down on and standing up from a chair with arms (e.g., wheelchair, bedside commode, etc,.)?: Total Help needed moving to and from a bed to chair (including a wheelchair)?: A Little Help needed walking in hospital room?: Total Help needed climbing 3-5 steps with a railing? : Total 6 Click Score: 12    End of Session   Activity Tolerance: Patient  tolerated treatment well Patient left: with call bell/phone within reach;with chair alarm set;in chair Nurse Communication: Mobility status PT Visit Diagnosis: Muscle weakness (generalized) (M62.81);Other abnormalities of gait and mobility (R26.89)     Time: 8372-9021 PT Time Calculation (min) (ACUTE ONLY): 29 min  Charges:  $Therapeutic Exercise: 8-22 mins $Therapeutic Activity: 8-22 mins                    G Codes:       Earney Navy, PTA Pager: [phone]     Darliss Cheney 10/17/2016, 5:23 PM

## 2016-10-17 NOTE — Progress Notes (Signed)
Discharge Planning: Received call from [facility] and they cannot accept referral for [facility]. Jonnie Finner RN CCM Case Mgmt phone [phone]

## 2016-10-17 NOTE — Progress Notes (Signed)
PROGRESS NOTE    [name]  GNF:[id number] DOB: [birth year] DOA: 10/14/2016 PCP: Steve Rattler, DO    Brief Narrative: 61 year old male, wheelchair mobile, with PMH of HTN, DM, PAD status post right BKA, chronic systolic CHF due to nonischemic cardiomyopathy, 2-D echo 07/2015: LVEF 15%, stage III chronic kidney disease, blindness, multiple wounds, severe noncompliance with medications and diet (also noted by home health RN during visits), presented to the ED on 10/14/16 with increasing lower extremity and scrotal swelling, difficulty urinating, dyspnea on exertion without cough, chest pain or palpitations. Increased abdominal swelling area did decrease appetite. Admitted for decompensated CHF. Cardiology consulted 10/15/16.  Assessment & Plan:   Active Problems:   DM (diabetes mellitus), type 2, uncontrolled (HCC)   Obesity, unspecified   Essential hypertension, benign   Noncompliance with medications   Diabetic neuropathy associated with type 2 diabetes mellitus ([place])   Blindness of left eye   History of right below knee amputation ([place])   PAD (peripheral artery disease) (HCC)   Pressure ulcer   Congestive dilated cardiomyopathy (HCC)   Chronic kidney disease (CKD), stage III (moderate)   Scrotal edema   Multiple open wounds of lower leg, initial encounter   Acute on chronic combined systolic and diastolic CHF (congestive heart failure) (HCC)   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  Echo shows LVef of 20 to 25%.  Started on lasix and metolazone, transition to po in am.  HF team on board. Not a candidate for advanced therapies with his co morbidities.    Hypertension:  Sub optimal.  Adjust meds.   Stage 3CKD:  Creatinine at baseline.    Deconditioning:  PT/OT EVAL recommending SNF.     Diabetes Mellitus:  CBG (last 3)   Recent Labs  10/16/16 2123 10/17/16 0753 10/17/16 1106  GLUCAP 123* 84 113*    Well controlled while iinpatient. But he is non compliant to meds at home.    PAD S/P BKA on the right. :  S/p skin graft, with poor healing. Wound care consulted and recommendations given.    DVT prophylaxis: sq heparin. Code Status: full code.  Family Communication: none at bedside.  Disposition Plan: home with home health in 1 to 2 days.    Consultants:   Heart failure team.    Procedures: none.   Antimicrobials: none.   Subjective: No new complaints.   Objective: Vitals:   10/16/16 1156 10/16/16 2000 10/17/16 0400 10/17/16 1234  BP: (!) 133/98 (!) 136/97 (!) 127/94 (!) 140/94  Pulse: 64 71 77 60  Resp: 20 18 18 18   Temp: 97.6 F (36.4 C) (!) 97.3 F (36.3 C) (!) 97.4 F (36.3 C) 98 F (36.7 C)  TempSrc: Oral Oral Oral Oral  SpO2: 94% 97% 98% 98%  Weight:   99.2 kg (218 lb 9.6 oz)   Height:        Intake/Output Summary (Last 24 hours) at 10/17/16 1345 Last data filed at 10/17/16 1234  Gross per 24 hour  Intake             1320 ml  Output             4275 ml  Net            -2955 ml   Filed Weights   10/15/16 0443 10/16/16 0451  10/17/16 0400  Weight: 103.1 kg (227 lb 4.7 oz) 101.8 kg (224 lb 6.4 oz) 99.2 kg (218 lb 9.6 oz)    Examination:  General exam: Appears calm and comfortable  Respiratory system: Clear to auscultation. Respiratory effort normal. Cardiovascular system: S1 & S2 heard, RRR. No JVD, murmurs, rubs, gallops or clicks. No pedal edema. Gastrointestinal system: Abdomen is nondistended, soft and nontender. No organomegaly or masses felt. Normal bowel sounds heard. Central nervous system: Alert and oriented. No focal neurological deficits. Extremities: right BKA bandaged.  Skin: No rashes, lesions or ulcers Psychiatry: mood appropriate.     Data Reviewed: I have personally reviewed following labs and imaging  studies  CBC:  Recent Labs Lab 10/14/16 0556 10/15/16 0336 10/16/16 0230  WBC 8.7 7.8 5.9  NEUTROABS 7.1  --   --   HGB 12.1* 11.4* 11.6*  HCT 37.5* 35.1* 36.7*  MCV 78.3 80.5 81.0  PLT 318 297 419   Basic Metabolic Panel:  Recent Labs Lab 10/14/16 0556 10/15/16 0336 10/16/16 0230 10/17/16 0343  NA 140 141 139 139  K 4.4 3.9 4.1 4.0  CL 107 104 103 99*  CO2 25 29 28 31   GLUCOSE 111* 84 83 80  BUN 28* 28* 31* 29*  CREATININE 1.38* 1.38* 1.29* 1.34*  CALCIUM 8.4* 8.4* 8.2* 8.4*  MG  --   --  1.6* 2.0   GFR: Estimated Creatinine Clearance: 72.4 mL/min (A) (by C-G formula based on SCr of 1.34 mg/dL (H)). Liver Function Tests:  Recent Labs Lab 10/15/16 0336  AST 18  ALT 19  ALKPHOS 252*  BILITOT 1.3*  PROT 5.7*  ALBUMIN 2.0*   No results for input(s): LIPASE, AMYLASE in the last 168 hours. No results for input(s): AMMONIA in the last 168 hours. Coagulation Profile:  Recent Labs Lab 10/14/16 1522 10/15/16 0336  INR 1.39 1.39   Cardiac Enzymes:  Recent Labs Lab 10/14/16 0556 10/14/16 1522 10/14/16 2115 10/15/16 0336  TROPONINI 0.04* 0.05* 0.04* 0.04*   BNP (last 3 results) No results for input(s): PROBNP in the last 8760 hours. HbA1C:  Recent Labs  10/14/16 1522  HGBA1C 7.2*   CBG:  Recent Labs Lab 10/16/16 1130 10/16/16 1637 10/16/16 2123 10/17/16 0753 10/17/16 1106  GLUCAP 130* 168* 123* 84 113*   Lipid Profile: No results for input(s): CHOL, HDL, LDLCALC, TRIG, CHOLHDL, LDLDIRECT in the last 72 hours. Thyroid Function Tests:  Recent Labs  10/14/16 1522  TSH 1.216   Anemia Panel: No results for input(s): VITAMINB12, FOLATE, FERRITIN, TIBC, IRON, RETICCTPCT in the last 72 hours. Sepsis Labs: No results for input(s): PROCALCITON, LATICACIDVEN in the last 168 hours.  Recent Results (from the past 240 hour(s))  Urine culture     Status: Abnormal   Collection Time: 10/14/16 11:33 AM  Result Value Ref Range Status    Specimen Description URINE, CLEAN CATCH  Final   Special Requests NONE  Final   Culture MULTIPLE SPECIES PRESENT, SUGGEST RECOLLECTION (A)  Final   Report Status 10/15/2016 FINAL  Final  Culture, blood (Routine X 2) w Reflex to ID Panel     Status: None (Preliminary result)   Collection Time: 10/14/16  3:35 PM  Result Value Ref Range Status   Specimen Description BLOOD RIGHT ARM  Final   Special Requests IN PEDIATRIC BOTTLE Blood Culture adequate volume  Final   Culture NO GROWTH 3 DAYS  Final   Report Status PENDING  Incomplete  Culture, blood (Routine X 2) w Reflex  to ID Panel     Status: None (Preliminary result)   Collection Time: 10/14/16  3:35 PM  Result Value Ref Range Status   Specimen Description BLOOD RIGHT WRIST  Final   Special Requests IN PEDIATRIC BOTTLE Blood Culture adequate volume  Final   Culture NO GROWTH 3 DAYS  Final   Report Status PENDING  Incomplete         Radiology Studies: No results found.      Scheduled Meds: . atorvastatin  40 mg Oral Daily  . bacitracin   Topical BID  . bisoprolol  5 mg Oral Daily  . brimonidine  1 drop Left Eye BID  . dorzolamide-timolol  1 drop Left Eye BID  . furosemide  80 mg Intravenous Q8H  . heparin  5,000 Units Subcutaneous Q8H  . insulin aspart  0-9 Units Subcutaneous TID WC  . metolazone  2.5 mg Oral Daily  . potassium chloride SA  20 mEq Oral BID  . sacubitril-valsartan  1 tablet Oral BID  . tamsulosin  0.4 mg Oral QPC supper  . vitamin A  10,000 Units Oral Daily   Continuous Infusions:   LOS: 3 days    Time spent: 45 minutes.     Hosie Poisson, MD [facility] Pager [phone]   If 7PM-7AM, please contact night-coverage [email or web address] Password [email or web address] 10/17/2016, 1:45 PM

## 2016-10-17 NOTE — Telephone Encounter (Signed)
No action needed, he has abrasions on his knees from crawling around the house, I told hospitalist to tell patient not to crawl on knees, hospitalist did not think I needed to see him

## 2016-10-17 NOTE — Progress Notes (Signed)
I stopped in to speak with Mr. Olmeda.  He is known to me through [facility] and [facility] [facility].  He tells me that he is not willing to go to SNF/ Rehab for "financial reasons".  I stressed that we are concerned about his living situation and his ability to care for himself inside his home.  I did let him know that we will continue Paramedicine and reinforced that he take his prescribed medications.  He will also follow in the [facility] after discharge.

## 2016-10-18 LAB — BASIC METABOLIC PANEL
Anion gap: 9 (ref 5–15)
BUN: 29 mg/dL — ABNORMAL HIGH (ref 6–20)
CO2: 34 mmol/L — ABNORMAL HIGH (ref 22–32)
Calcium: 8.7 mg/dL — ABNORMAL LOW (ref 8.9–10.3)
Chloride: 96 mmol/L — ABNORMAL LOW (ref 101–111)
Creatinine, Ser: 1.28 mg/dL — ABNORMAL HIGH (ref 0.61–1.24)
GFR calc Af Amer: >60 mL/min (ref >60)
GFR calc non Af Amer: 60 mL/min — ABNORMAL LOW (ref >60)
Glucose, Bld: 114 mg/dL — ABNORMAL HIGH (ref 65–99)
Potassium: 4 mmol/L (ref 3.5–5.1)
Sodium: 139 mmol/L (ref 135–145)

## 2016-10-18 LAB — GLUCOSE, CAPILLARY
Glucose-Capillary: 106 mg/dL — ABNORMAL HIGH (ref 65–99)
Glucose-Capillary: 112 mg/dL — ABNORMAL HIGH (ref 65–99)
Glucose-Capillary: 151 mg/dL — ABNORMAL HIGH (ref 65–99)

## 2016-10-18 MED ORDER — BACITRACIN ZINC 500 UNIT/GM EX OINT: TOPICAL_OINTMENT | Freq: Two times a day (BID) | CUTANEOUS | 0 refills | Status: DC

## 2016-10-18 MED ORDER — METOLAZONE 2.5 MG PO TABS: 2.5000 mg | ORAL_TABLET | Freq: Every day | ORAL | 0 refills | Status: DC

## 2016-10-18 MED ORDER — ATORVASTATIN CALCIUM 40 MG PO TABS: 40.0000 mg | ORAL_TABLET | Freq: Every day | ORAL | 0 refills | Status: DC

## 2016-10-18 MED ORDER — BISOPROLOL FUMARATE 5 MG PO TABS: 5.0000 mg | ORAL_TABLET | Freq: Every day | ORAL | 1 refills | Status: DC

## 2016-10-18 NOTE — Care Management Note (Signed)
Case Management Note  Patient Details  Name: [name] MRN: [id number] Date of Birth: October 04, 1955  Subjective/Objective:                 Per previous note "10/17/2016 1123 Received call from [facility] rep, and pt is open with their agency. And they plan to keep referral. " No other CM needs identified.    Action/Plan:   Expected Discharge Date:  10/17/16               Expected Discharge Plan:  [place]  In-House Referral:  Clinical Social Work  Discharge planning Services  CM Consult  Post Acute Care Choice:  Home Health Choice offered to:  Patient  DME Arranged:  N/A DME Agency:  NA  HH Arranged:  RN, Nurse's Aide, Social Work [employer] Agency:  [facility]  Status of Service:  Completed, signed off  If discussed at [employer], dates discussed:    Additional Comments:  Carles Collet, RN 10/18/2016, 11:15 AM

## 2016-10-18 NOTE — Telephone Encounter (Signed)
ERROR

## 2016-10-18 NOTE — Progress Notes (Addendum)
After his bed alarm began ringing, pt was observed to be in a crawling or praying position with his knees on the floor and his elbows on the side of the bed, facing the bed. Pt says he was reaching for his urinal, however both urinals were on his bedside table right beside his bed. Pt had soiled his bed with urine. RN does not believe the patient fell due to his positioning. Yellow armband, yellow sock, and bed alarm still in use. Will continue to monitor.

## 2016-10-18 NOTE — Progress Notes (Signed)
Printed pt's AVS.  RN tried to call pts sister to pick him up and the number in the chart is not in service.  Will ask pt for other family members phone numbers.

## 2016-10-18 NOTE — Progress Notes (Signed)
Occupational Therapy Treatment Patient Details Name: [name] MRN: [id number] DOB: 1955-08-10 Today's Date: 10/18/2016    History of present illness  [name] is a 61 y.o. male with multiple medical comorbid disease, including poorly controlled diabetes, chronic systolic heart failure with known EF of 20 to 25%, peripheral vascular disease, status post right BKA, with poor healing one on the right BKA stump, followed by Dr. Sharol Given, presenting today to the hospital, with increasing scrotal edema and pain in scrotum.    OT comments  Pt declined BSC transfer with OT this session, but agreeable to EOB activity. OT will continue to follow acutely  Follow Up Recommendations  SNF;Home health OT    Equipment Recommendations  None recommended by OT    Recommendations for Other Services      Precautions / Restrictions Precautions Precautions: Fall Precaution Comments: significant scrotal edema Restrictions Other Position/Activity Restrictions: Right BKA, bilat knees and L foot wounds       Mobility Bed Mobility Overal bed mobility: Needs Assistance Bed Mobility: Supine to Sit;Rolling;Sit to Supine     Supine to sit: Supervision Sit to supine: Supervision   General bed mobility comments: use of rails to roll and increased time and effort to get into sitting  Transfers                 General transfer comment: pt declined scooting back onto [facility] with OT. per PT pt is min guard A with lateral/scoot transfers    Balance Overall balance assessment: Needs assistance Sitting-balance support: Feet supported;No upper extremity supported Sitting balance-Leahy Scale: Fair Sitting balance - Comments: decreased dynamic sitting balance during simulated bathing tasks at EOB                                   ADL either performed or assessed with clinical judgement   ADL Overall ADL's : Needs assistance/impaired     Grooming: Wash/dry hands;Wash/dry  face;Sitting;Min guard;Oral care Grooming Details (indicate cue type and reason): decreased dynamic sitting balance, min UB/trunk support required with verbalc cues to correct posture Upper Body Bathing: Sitting;Min guard Upper Body Bathing Details (indicate cue type and reason): decreased dynamic sitting balance, min UB/trunk support required with verbalc cues to correct posture Lower Body Bathing: Min guard;Sitting/lateral leans Lower Body Bathing Details (indicate cue type and reason): decreased dynamic sitting balance, min UB/trunk support required with verbalc cues to correct posture                       General ADL Comments: pt declined transfer to [place]  no change from baseline                Cognition Arousal/Alertness: Awake/alert Behavior During Therapy: WFL for tasks assessed/performed Overall Cognitive Status: Within Functional Limits for tasks assessed                                                      General Comments  pt pleasant    Pertinent Vitals/ Pain       Pain Assessment: 0-10 Faces Pain Scale: Hurts even more Pain Location: scrotum - during mobililty  Pain Descriptors / Indicators: Moaning;Grimacing;Guarding Pain Intervention(s): Monitored during session;Repositioned  Home  Living                                          Prior Functioning/Environment              Frequency  Min 2X/week        Progress Toward Goals  OT Goals(current goals can now be found in the care plan section)  Progress towards OT goals: OT to reassess next treatment  Acute Rehab OT Goals Patient Stated Goal: Return home  Plan Discharge plan remains appropriate                      AM-PAC PT "6 Clicks" Daily Activity     Outcome Measure   Help from another person eating meals?: None Help from another person taking care of personal grooming?: None Help from another person toileting, which  includes using toliet, bedpan, or urinal?: A Little Help from another person bathing (including washing, rinsing, drying)?: A Little Help from another person to put on and taking off regular upper body clothing?: A Little Help from another person to put on and taking off regular lower body clothing?: A Lot 6 Click Score: 19    End of Session    OT Visit Diagnosis: Muscle weakness (generalized) (M62.81);Pain Pain - Right/Left:  (scrtoal area) Pain - part of body:  (scrotal area)   Activity Tolerance Patient tolerated treatment well   Patient Left with call bell/phone within reach;in bed;with bed alarm set   Nurse Communication      Functional Assessment Tool Used: AM-PAC 6 Clicks Daily Activity   Time: 1941-7408 OT Time Calculation (min): 19 min  Charges: OT G-codes **NOT FOR INPATIENT CLASS** Functional Assessment Tool Used: AM-PAC 6 Clicks Daily Activity OT General Charges $OT Visit: 1 Procedure OT Treatments $Self Care/Home Management : 8-22 mins     Britt Bottom 10/18/2016, 2:13 PM

## 2016-10-18 NOTE — Progress Notes (Signed)
Advanced Heart Failure Rounding Note   Primary Cardiologist: Dr Aundra Dubin   Subjective:    Admitted with marked volume overload in the setting of medication noncompliance.  Diuresing with IV lasix + metolazone.   Weight down 18 pounds total. Says his breathing has improved, denies SOB and chest pain.    Objective:   Weight Range: 210 lb (95.3 kg) Body mass index is 28.48 kg/m.   Vital Signs:   Temp:  [98 F (36.7 C)-98.2 F (36.8 C)] 98 F (36.7 C) (08/16 0351) Pulse Rate:  [60-66] 66 (08/16 0351) Resp:  [18] 18 (08/16 0351) BP: (134-140)/(90-94) 139/90 (08/16 0351) SpO2:  [98 %] 98 % (08/16 0351) Weight:  [210 lb (95.3 kg)] 210 lb (95.3 kg) (08/16 0351) Last BM Date: 10/17/16  Weight change: Filed Weights   10/16/16 0451 10/17/16 0400 10/18/16 0351  Weight: 224 lb 6.4 oz (101.8 kg) 218 lb 9.6 oz (99.2 kg) 210 lb (95.3 kg)    Intake/Output:   Intake/Output Summary (Last 24 hours) at 10/18/16 0941 Last data filed at 10/18/16 2355  Gross per 24 hour  Intake             1720 ml  Output             2502 ml  Net             -782 ml      Physical Exam    General: Well appearing. No resp difficulty. HEENT: Normal Neck: Supple. JVP 7-8. Carotids 2+ bilat; no bruits. No thyromegaly or nodule noted. Cor: PMI nondisplaced. RRR, No M/G/R noted Lungs: CTAB, normal effort. Abdomen: Soft, non-tender, non-distended, no HSM. No bruits or masses. +BS  Extremities: No cyanosis, clubbing, rash, R BKA with foam dressing. 1+ pedal edema on L.  Neuro: Alert & orientedx3, cranial nerves grossly intact. moves all 4 extremities w/o difficulty. Affect flat     Telemetry   NSR, 70-80 bpm - personally reviewed.   EKG   Admit EKG. NSR 71 bpm personally reviewed.  Labs    CBC  Recent Labs  10/16/16 0230  WBC 5.9  HGB 11.6*  HCT 36.7*  MCV 81.0  PLT 732   Basic Metabolic Panel  Recent Labs  10/16/16 0230 10/17/16 0343 10/18/16 0436  NA 139 139 139  K 4.1  4.0 4.0  CL 103 99* 96*  CO2 28 31 34*  GLUCOSE 83 80 114*  BUN 31* 29* 29*  CREATININE 1.29* 1.34* 1.28*  CALCIUM 8.2* 8.4* 8.7*  MG 1.6* 2.0  --    Liver Function Tests No results for input(s): AST, ALT, ALKPHOS, BILITOT, PROT, ALBUMIN in the last 72 hours. No results for input(s): LIPASE, AMYLASE in the last 72 hours. Cardiac Enzymes No results for input(s): CKTOTAL, CKMB, CKMBINDEX, TROPONINI in the last 72 hours.  BNP: BNP (last 3 results)  Recent Labs  05/24/16 1034 09/24/16 1407 10/14/16 0556  BNP 2,709.6* 3,276.9* >4,500.0*    ProBNP (last 3 results) No results for input(s): PROBNP in the last 8760 hours.   D-Dimer No results for input(s): DDIMER in the last 72 hours. Hemoglobin A1C No results for input(s): HGBA1C in the last 72 hours. Fasting Lipid Panel No results for input(s): CHOL, HDL, LDLCALC, TRIG, CHOLHDL, LDLDIRECT in the last 72 hours. Thyroid Function Tests No results for input(s): TSH, T4TOTAL, T3FREE, THYROIDAB in the last 72 hours.  Invalid input(s): FREET3  Other results:   Imaging    No results found.  Medications:     Scheduled Medications: . atorvastatin  40 mg Oral Daily  . bacitracin   Topical BID  . bisoprolol  5 mg Oral Daily  . brimonidine  1 drop Left Eye BID  . dorzolamide-timolol  1 drop Left Eye BID  . furosemide  80 mg Intravenous Q8H  . heparin  5,000 Units Subcutaneous Q8H  . insulin aspart  0-9 Units Subcutaneous TID WC  . metolazone  2.5 mg Oral Daily  . potassium chloride SA  20 mEq Oral BID  . sacubitril-valsartan  1 tablet Oral BID  . tamsulosin  0.4 mg Oral QPC supper  . vitamin A  10,000 Units Oral Daily    Infusions:   PRN Medications: acetaminophen, bisacodyl, hydrALAZINE, ondansetron **OR** ondansetron (ZOFRAN) IV, oxyCODONE-acetaminophen, senna-docusate    Patient Profile   ICHOLAS IRBY is a 61 y.o. male with history of Chronic systolic CHF due to NICM, Echo 09/24/16 with LVEF 20-25%,  HTN, DM2, PAD s/p R BKA, CKD stage 3, and history of medical non-compliance.   Admitted 10/14/16 from ED with scrotal edema and pain along with worsening peripheral edema and SOB  Assessment/Plan   1. Acute on chronic systolic CHF.  - Echo 3/74/82 with LVEF 20-25%, Mod RAE, Mod/Sev TR, PA peak pressure 63 mmg Hg - Historically very non-compliant with his medications, and remains so per recent paramedicine visit.  - Start po Lasix today 80 mg in the am and 40 mg in the pm.  - Continue 40 meq potassium daily.  - No spiro or digoxin with  non compliance.  -  Continue bisoprolol 5 mg daily.  - Continue Entresto 97/103 mg BID.  - He is NOT a candidate for advanced therapies with his co morbidities and non-compliance.  2. HTN - Better controlled with increased Entresto 3. DM2, poorly controlled - Per primary. Remains noncompliant with his meds.  - No change.  4. PAD s/p R BKA - Follows with Dr. Sharol Given. Had recent skin graft placed.Remains poorly healing.  -WOC evaluated. Unna boot to RLE with twice weekly dressing changes.  - no change   5. 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  7. Deconditioning- PT/OT recommending SNF. He refuses due to cost. Will return home with home health. Will continue paramedicine.    Length of Stay: [place], NP  10/18/2016, 9:41 AM  Advanced Heart Failure Team Pager [phone] (M-F; 7a - 4p)  Please contact [facility] for night-coverage after hours (4p -7a ) and weekends on [email or web address]  Patient seen and examined with Jettie Booze, NP. We discussed all aspects of the encounter. I agree with the assessment and plan as stated above.   Volume status improved. Agree with switching to po lasix. Renal function looks good. He refuses SNF. [place] for d/c from our standpoint. F/u with paramedicine.   Glori Bickers, MD  1:44 PM

## 2016-10-19 ENCOUNTER — Inpatient Hospital Stay (HOSPITAL_COMMUNITY)
Admission: EM | Admit: 2016-10-19 | Discharge: 2016-10-20 | DRG: 101 | Disposition: A | Discharge status: Home Health | Payer: Medicaid Other | Attending: Internal Medicine | Admitting: Internal Medicine

## 2016-10-19 ENCOUNTER — Encounter (HOSPITAL_COMMUNITY): Payer: Self-pay | Admitting: Emergency Medicine

## 2016-10-19 ENCOUNTER — Inpatient Hospital Stay (HOSPITAL_COMMUNITY): Payer: Medicaid Other

## 2016-10-19 ENCOUNTER — Other Ambulatory Visit (HOSPITAL_COMMUNITY): Payer: Self-pay

## 2016-10-19 ENCOUNTER — Emergency Department (HOSPITAL_COMMUNITY): Payer: Medicaid Other

## 2016-10-19 DIAGNOSIS — I071 Rheumatic tricuspid insufficiency: Secondary | ICD-10-CM | POA: Diagnosis present

## 2016-10-19 DIAGNOSIS — H5462 Unqualified visual loss, left eye, normal vision right eye: Secondary | ICD-10-CM | POA: Diagnosis present

## 2016-10-19 DIAGNOSIS — I428 Other cardiomyopathies: Secondary | ICD-10-CM | POA: Diagnosis present

## 2016-10-19 DIAGNOSIS — R4781 Slurred speech: Secondary | ICD-10-CM

## 2016-10-19 DIAGNOSIS — I5022 Chronic systolic (congestive) heart failure: Secondary | ICD-10-CM | POA: Diagnosis present

## 2016-10-19 DIAGNOSIS — G4089 Other seizures: Principal | ICD-10-CM | POA: Diagnosis present

## 2016-10-19 DIAGNOSIS — I110 Hypertensive heart disease with heart failure: Secondary | ICD-10-CM | POA: Diagnosis present

## 2016-10-19 DIAGNOSIS — Z89511 Acquired absence of right leg below knee: Secondary | ICD-10-CM | POA: Diagnosis not present

## 2016-10-19 DIAGNOSIS — R569 Unspecified convulsions: Secondary | ICD-10-CM | POA: Diagnosis not present

## 2016-10-19 DIAGNOSIS — Z794 Long term (current) use of insulin: Secondary | ICD-10-CM

## 2016-10-19 DIAGNOSIS — R531 Weakness: Secondary | ICD-10-CM

## 2016-10-19 DIAGNOSIS — Z6828 Body mass index (BMI) 28.0-28.9, adult: Secondary | ICD-10-CM

## 2016-10-19 DIAGNOSIS — Z87891 Personal history of nicotine dependence: Secondary | ICD-10-CM | POA: Diagnosis not present

## 2016-10-19 DIAGNOSIS — L97921 Non-pressure chronic ulcer of unspecified part of left lower leg limited to breakdown of skin: Secondary | ICD-10-CM | POA: Diagnosis present

## 2016-10-19 DIAGNOSIS — E1140 Type 2 diabetes mellitus with diabetic neuropathy, unspecified: Secondary | ICD-10-CM | POA: Diagnosis present

## 2016-10-19 DIAGNOSIS — I420 Dilated cardiomyopathy: Secondary | ICD-10-CM | POA: Diagnosis present

## 2016-10-19 DIAGNOSIS — I69398 Other sequelae of cerebral infarction: Secondary | ICD-10-CM

## 2016-10-19 DIAGNOSIS — L97911 Non-pressure chronic ulcer of unspecified part of right lower leg limited to breakdown of skin: Secondary | ICD-10-CM | POA: Diagnosis present

## 2016-10-19 DIAGNOSIS — G40909 Epilepsy, unspecified, not intractable, without status epilepticus: Secondary | ICD-10-CM

## 2016-10-19 DIAGNOSIS — E1151 Type 2 diabetes mellitus with diabetic peripheral angiopathy without gangrene: Secondary | ICD-10-CM | POA: Diagnosis present

## 2016-10-19 DIAGNOSIS — I2729 Other secondary pulmonary hypertension: Secondary | ICD-10-CM | POA: Diagnosis present

## 2016-10-19 DIAGNOSIS — I272 Pulmonary hypertension, unspecified: Secondary | ICD-10-CM | POA: Diagnosis not present

## 2016-10-19 DIAGNOSIS — E1169 Type 2 diabetes mellitus with other specified complication: Secondary | ICD-10-CM

## 2016-10-19 DIAGNOSIS — I872 Venous insufficiency (chronic) (peripheral): Secondary | ICD-10-CM | POA: Diagnosis present

## 2016-10-19 DIAGNOSIS — I63133 Cerebral infarction due to embolism of bilateral carotid arteries: Secondary | ICD-10-CM

## 2016-10-19 DIAGNOSIS — I639 Cerebral infarction, unspecified: Secondary | ICD-10-CM | POA: Diagnosis present

## 2016-10-19 DIAGNOSIS — E785 Hyperlipidemia, unspecified: Secondary | ICD-10-CM | POA: Diagnosis present

## 2016-10-19 DIAGNOSIS — Z8249 Family history of ischemic heart disease and other diseases of the circulatory system: Secondary | ICD-10-CM

## 2016-10-19 DIAGNOSIS — Z79899 Other long term (current) drug therapy: Secondary | ICD-10-CM

## 2016-10-19 DIAGNOSIS — E669 Obesity, unspecified: Secondary | ICD-10-CM | POA: Diagnosis present

## 2016-10-19 DIAGNOSIS — I5023 Acute on chronic systolic (congestive) heart failure: Secondary | ICD-10-CM | POA: Diagnosis not present

## 2016-10-19 LAB — URINALYSIS, ROUTINE W REFLEX MICROSCOPIC
Bilirubin Urine: NEGATIVE
Glucose, UA: NEGATIVE mg/dL
Hgb urine dipstick: NEGATIVE
Ketones, ur: NEGATIVE mg/dL
Leukocytes, UA: NEGATIVE
Nitrite: NEGATIVE
Protein, ur: 30 mg/dL — AB
Specific Gravity, Urine: 1.012 (ref 1.005–1.030)
Squamous Epithelial / LPF: NONE SEEN
pH: 6 (ref 5.0–8.0)

## 2016-10-19 LAB — PROTIME-INR
INR: 1.26
Prothrombin Time: 15.9 seconds — ABNORMAL HIGH (ref 11.4–15.2)

## 2016-10-19 LAB — GLUCOSE, CAPILLARY: Glucose-Capillary: 121 mg/dL — ABNORMAL HIGH (ref 65–99)

## 2016-10-19 LAB — CULTURE, BLOOD (ROUTINE X 2)
Culture: NO GROWTH
Culture: NO GROWTH
Special Requests: ADEQUATE
Special Requests: ADEQUATE

## 2016-10-19 LAB — DIFFERENTIAL
Basophils Absolute: 0 10*3/uL (ref 0.0–0.1)
Basophils Relative: 0 %
Eosinophils Absolute: 0 10*3/uL (ref 0.0–0.7)
Eosinophils Relative: 0 %
Lymphocytes Relative: 9 %
Lymphs Abs: 0.7 10*3/uL (ref 0.7–4.0)
Monocytes Absolute: 1.1 10*3/uL — ABNORMAL HIGH (ref 0.1–1.0)
Monocytes Relative: 16 %
Neutro Abs: 5.3 10*3/uL (ref 1.7–7.7)
Neutrophils Relative %: 75 %

## 2016-10-19 LAB — RAPID URINE DRUG SCREEN, HOSP PERFORMED
Amphetamines: NOT DETECTED
Barbiturates: NOT DETECTED
Benzodiazepines: NOT DETECTED
Cocaine: NOT DETECTED
Opiates: NOT DETECTED
Tetrahydrocannabinol: NOT DETECTED

## 2016-10-19 LAB — COMPREHENSIVE METABOLIC PANEL
ALT: 17 U/L (ref 17–63)
AST: 23 U/L (ref 15–41)
Albumin: 2.3 g/dL — ABNORMAL LOW (ref 3.5–5.0)
Alkaline Phosphatase: 272 U/L — ABNORMAL HIGH (ref 38–126)
Anion gap: 12 (ref 5–15)
BUN: 33 mg/dL — ABNORMAL HIGH (ref 6–20)
CO2: 30 mmol/L (ref 22–32)
Calcium: 8.5 mg/dL — ABNORMAL LOW (ref 8.9–10.3)
Chloride: 96 mmol/L — ABNORMAL LOW (ref 101–111)
Creatinine, Ser: 1.34 mg/dL — ABNORMAL HIGH (ref 0.61–1.24)
GFR calc Af Amer: >60 mL/min (ref >60)
GFR calc non Af Amer: 56 mL/min — ABNORMAL LOW (ref >60)
Glucose, Bld: 106 mg/dL — ABNORMAL HIGH (ref 65–99)
Potassium: 4.2 mmol/L (ref 3.5–5.1)
Sodium: 138 mmol/L (ref 135–145)
Total Bilirubin: 1.5 mg/dL — ABNORMAL HIGH (ref 0.3–1.2)
Total Protein: 6.4 g/dL — ABNORMAL LOW (ref 6.5–8.1)

## 2016-10-19 LAB — I-STAT CHEM 8, ED
BUN: 51 mg/dL — ABNORMAL HIGH (ref 6–20)
Calcium, Ion: 0.96 mmol/L — ABNORMAL LOW (ref 1.15–1.40)
Chloride: 94 mmol/L — ABNORMAL LOW (ref 101–111)
Creatinine, Ser: 1.4 mg/dL — ABNORMAL HIGH (ref 0.61–1.24)
Glucose, Bld: 104 mg/dL — ABNORMAL HIGH (ref 65–99)
HCT: 42 % (ref 39.0–52.0)
Hemoglobin: 14.3 g/dL (ref 13.0–17.0)
Potassium: 6.4 mmol/L (ref 3.5–5.1)
Sodium: 135 mmol/L (ref 135–145)
TCO2: 36 mmol/L (ref 0–100)

## 2016-10-19 LAB — CBC
HCT: 37.6 % — ABNORMAL LOW (ref 39.0–52.0)
Hemoglobin: 12.5 g/dL — ABNORMAL LOW (ref 13.0–17.0)
MCH: 26 pg (ref 26.0–34.0)
MCHC: 33.2 g/dL (ref 30.0–36.0)
MCV: 78.2 fL (ref 78.0–100.0)
Platelets: 310 10*3/uL (ref 150–400)
RBC: 4.81 MIL/uL (ref 4.22–5.81)
RDW: 19.6 % — ABNORMAL HIGH (ref 11.5–15.5)
WBC: 7.1 10*3/uL (ref 4.0–10.5)

## 2016-10-19 LAB — ETHANOL: Alcohol, Ethyl (B): <5 mg/dL (ref <5)

## 2016-10-19 LAB — CBG MONITORING, ED: Glucose-Capillary: 122 mg/dL — ABNORMAL HIGH (ref 65–99)

## 2016-10-19 LAB — I-STAT TROPONIN, ED: Troponin i, poc: 0.02 ng/mL (ref 0.00–0.08)

## 2016-10-19 LAB — APTT: aPTT: 38 seconds — ABNORMAL HIGH (ref 24–36)

## 2016-10-19 MED ORDER — STROKE: EARLY STAGES OF RECOVERY BOOK
Freq: Once | Status: AC
  Administered 2016-10-19: 23:00:00

## 2016-10-19 MED ORDER — METOLAZONE 2.5 MG PO TABS
2.5000 mg | ORAL_TABLET | Freq: Every day | ORAL | Status: DC
  Administered 2016-10-20: 2.5 mg via ORAL
  Filled 2016-10-19: qty 1

## 2016-10-19 MED ORDER — FUROSEMIDE 80 MG PO TABS
80.0000 mg | ORAL_TABLET | Freq: Every day | ORAL | Status: DC
  Administered 2016-10-20: 80 mg via ORAL
  Filled 2016-10-19: qty 1

## 2016-10-19 MED ORDER — BRIMONIDINE TARTRATE 0.2 % OP SOLN
1.0000 [drp] | Freq: Every day | OPHTHALMIC | Status: DC
  Administered 2016-10-20: 1 [drp] via OPHTHALMIC
  Filled 2016-10-19: qty 5

## 2016-10-19 MED ORDER — ACETAMINOPHEN 160 MG/5ML PO SOLN: 650.0000 mg | ORAL | Status: DC | PRN

## 2016-10-19 MED ORDER — INSULIN ASPART 100 UNIT/ML ~~LOC~~ SOLN: 0.0000 [IU] | Freq: Three times a day (TID) | SUBCUTANEOUS | Status: DC

## 2016-10-19 MED ORDER — BISOPROLOL FUMARATE 5 MG PO TABS
5.0000 mg | ORAL_TABLET | Freq: Every day | ORAL | Status: DC
  Administered 2016-10-20: 5 mg via ORAL
  Filled 2016-10-19: qty 1

## 2016-10-19 MED ORDER — POTASSIUM CHLORIDE CRYS ER 20 MEQ PO TBCR
40.0000 meq | EXTENDED_RELEASE_TABLET | Freq: Every day | ORAL | Status: DC
  Administered 2016-10-20: 40 meq via ORAL
  Filled 2016-10-19: qty 2

## 2016-10-19 MED ORDER — ENOXAPARIN SODIUM 40 MG/0.4ML ~~LOC~~ SOLN
40.0000 mg | SUBCUTANEOUS | Status: DC
  Administered 2016-10-19: 40 mg via SUBCUTANEOUS
  Filled 2016-10-19: qty 0.4

## 2016-10-19 MED ORDER — DORZOLAMIDE HCL-TIMOLOL MAL 2-0.5 % OP SOLN
1.0000 [drp] | Freq: Every day | OPHTHALMIC | Status: DC
  Administered 2016-10-20: 1 [drp] via OPHTHALMIC
  Filled 2016-10-19: qty 10

## 2016-10-19 MED ORDER — TAMSULOSIN HCL 0.4 MG PO CAPS
0.4000 mg | ORAL_CAPSULE | Freq: Every day | ORAL | Status: DC
  Filled 2016-10-19: qty 1

## 2016-10-19 MED ORDER — ASPIRIN 325 MG PO TABS
325.0000 mg | ORAL_TABLET | Freq: Every day | ORAL | Status: DC
  Administered 2016-10-19 – 2016-10-20 (×2): 325 mg via ORAL
  Filled 2016-10-19 (×2): qty 1

## 2016-10-19 MED ORDER — ATORVASTATIN CALCIUM 40 MG PO TABS
40.0000 mg | ORAL_TABLET | Freq: Every day | ORAL | Status: DC
  Administered 2016-10-19: 40 mg via ORAL
  Filled 2016-10-19: qty 1

## 2016-10-19 MED ORDER — ACETAMINOPHEN 650 MG RE SUPP: 650.0000 mg | RECTAL | Status: DC | PRN

## 2016-10-19 MED ORDER — INSULIN GLARGINE 100 UNIT/ML ~~LOC~~ SOLN
10.0000 [IU] | Freq: Every day | SUBCUTANEOUS | Status: DC
  Administered 2016-10-19: 10 [IU] via SUBCUTANEOUS
  Filled 2016-10-19 (×2): qty 0.1

## 2016-10-19 MED ORDER — INSULIN ASPART 100 UNIT/ML ~~LOC~~ SOLN: 0.0000 [IU] | Freq: Every day | SUBCUTANEOUS | Status: DC

## 2016-10-19 MED ORDER — SACUBITRIL-VALSARTAN 49-51 MG PO TABS
1.0000 | ORAL_TABLET | Freq: Two times a day (BID) | ORAL | Status: DC
  Administered 2016-10-19 – 2016-10-20 (×2): 1 via ORAL
  Filled 2016-10-19 (×3): qty 1

## 2016-10-19 MED ORDER — ACETAMINOPHEN 325 MG PO TABS
650.0000 mg | ORAL_TABLET | ORAL | Status: DC | PRN
  Administered 2016-10-20: 650 mg via ORAL
  Filled 2016-10-19: qty 2

## 2016-10-19 MED ORDER — ASPIRIN 300 MG RE SUPP: 300.0000 mg | Freq: Every day | RECTAL | Status: DC

## 2016-10-19 NOTE — ED Notes (Signed)
Report given to 51M

## 2016-10-19 NOTE — Code Documentation (Signed)
61yo male arriving to [facility] via [place] at 42.  Patient from home where he was with a paramedic who was providing education to him at 0930 when he had sudden onset left arm twitching described as a partial seizure.  EMS called and noted left arm weakness and dysarthria and activated a code stroke.  Stroke team at the bedside on patient arrival.  Patient cleared for CT by Dr. Leonette Monarch.  Patient to CT with team.  Labs drawn by ED RN in CT.  CT completed.  NIHSS 1, see documentation for details and code stroke times.  Patient with mild dysarthria, however, patient states speech is at baseline. Of note, patient with left eye and partial right eye blindness at baseline per report.  No acute stroke treatment at this time.  Code stroke canceled.  Bedside handoff with ED RN Carmelina Paddock.

## 2016-10-19 NOTE — ED Provider Notes (Signed)
[facility] DEPT Provider Note   CSN: [id number] Arrival date & time: 10/14/16  6948     History   Chief Complaint Chief Complaint  Patient presents with  . Testicle Pain    HPI [name] is a 61 y.o. male.  HPI Patient presents to the emergency department with swelling to the scrotum and lower abdomen and lower extremities.  The patient states that he has been taking his fluid pills as prescribed.  He states this happened previously.  He was admitted to the hospital.  The patient states that he feels like he is to 20, weakness in patient denies any alleviating or worsening factors.  The patient states that he did not take any other medications prior to arrival. The patient denies chest pain,  headache,blurred vision, neck pain, fever, cough, weakness, numbness, dizziness, anorexia,abdominal pain, nausea, vomiting, diarrhea, rash, back pain, dysuria, hematemesis, bloody stool, near syncope, or syncope. Past Medical History:  Diagnosis Date  . Acute on chronic systolic heart failure, NYHA class 3 ([place])   . AKI (acute kidney injury) ([place])   . Anemia   . BKA stump complication ([place])   . Blind left eye   . Blind left eye   . Cardiomyopathy, dilated ([place])   . CHF (congestive heart failure) ([place])   . Combined systolic and diastolic congestive heart failure ([place])   . Congestive dilated cardiomyopathy ([place]) 07/11/2015  . Diabetes mellitus   . Diabetic neuropathy associated with type 2 diabetes mellitus ([place])   . Hypertension   . Neuropathy   . Noncompliance with medications   . Open knee wound 10/2015   on rt bka  . PAD (peripheral artery disease) ([place])   . Protein calorie malnutrition ([place])   . Shortness of breath dyspnea     Patient Active Problem List   Diagnosis Date Noted  . Fluid overload 10/14/2016  . 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 Cardiorenal syndrome with renal failure 09/28/2016    . Acute urinary retention 09/26/2016  . Acute on chronic combined systolic and diastolic CHF (congestive heart failure) ([place]) 09/26/2016  . Scrotal edema 09/24/2016  . Multiple open wounds of lower leg, initial encounter 09/24/2016  . Tinea of groin 09/24/2016  . Chronic kidney disease (CKD), stage III (moderate) 06/07/2016  . Chronic combined systolic and diastolic congestive heart failure ([place])   . Protein calorie malnutrition ([place]) 07/12/2015  . Congestive dilated cardiomyopathy ([place]) 07/11/2015  . Pressure ulcer 07/09/2015  . Open knee wound 07/06/2015  . History of right below knee amputation ([place]) 02/22/2015  . PAD (peripheral artery disease) ([place]) 02/22/2015  . Blindness of left eye 01/04/2015  . Complications, amputation stump late ([place]) 08/06/2014  . Diabetic neuropathy associated with type 2 diabetes mellitus ([place]) 12/01/2013  . Noncompliance with medications 07/13/2010  . OTHER SPEC TYPES SCHIZOPHRENIA UNSPEC CONDITION 05/24/2009  . Obesity, unspecified 06/09/2007  . DM (diabetes mellitus), type 2, uncontrolled ([place]) 05/02/2006  . Essential hypertension, benign 05/02/2006    Past Surgical History:  Procedure Laterality Date  . AMPUTATION Right 09/26/2013   Procedure: AMPUTATION BELOW KNEE;  Surgeon: Rozanna Box, MD;  Location: [facility];  Service: Orthopedics;  Laterality: Right;  . AMPUTATION Right 05/01/2015   Procedure: REVISION OF RIGHT TRANSTIBIAL AMPUTATION ;  Surgeon: Newt Minion, MD;  Location: [facility];  Service: Orthopedics;  Laterality: Right;  . APPLICATION OF WOUND VAC  08/10/2016   Procedure: APPLICATION OFI NCISONAL  WOUND VAC;  Surgeon: Newt Minion, MD;  Location: [facility];  Service: Orthopedics;;  . CARDIAC CATHETERIZATION N/A 07/13/2015   Procedure: Right/Left Heart Cath and Coronary Angiography;  Surgeon: Jolaine Artist, MD;  Location: [facility];  Service: Cardiovascular;  Laterality: N/A;  . COLONOSCOPY    . I&D EXTREMITY Right 09/24/2013    Procedure: IRRIGATION AND DEBRIDEMENT RIGHT FOOT ULCER;  Surgeon: Renette Butters, MD;  Location: [facility];  Service: Orthopedics;  Laterality: Right;  . I&D EXTREMITY Right 09/26/2013   Procedure: Repeat IRRIGATION AND DEBRIDEMENT Right Foot Ulcer;  Surgeon: Rozanna Box, MD;  Location: [facility];  Service: Orthopedics;  Laterality: Right;  . MULTIPLE TOOTH EXTRACTIONS    . SKIN SPLIT GRAFT Right 08/10/2016   Procedure: SKIN GRAFT SPLIT THICKNESS RIGHT LEG;  Surgeon: Newt Minion, MD;  Location: [facility];  Service: Orthopedics;  Laterality: Right;  . STUMP REVISION Right 04/20/2015   Procedure: Revision Right Below Knee Amputation, Apply Wound VAC;  Surgeon: Newt Minion, MD;  Location: [facility];  Service: Orthopedics;  Laterality: Right;  . TONSILLECTOMY         Home Medications    Prior to Admission medications   Medication Sig Start Date End Date Taking? Authorizing Provider  brimonidine (ALPHAGAN) 0.2 % ophthalmic solution PLACE 1 DROP INTO THE LEFT EYE 2 (TWO) TIMES DAILY. Patient taking differently: Place 1 drop into the left eye daily.  06/15/15  Yes Mariel Aloe, MD  dorzolamide-timolol (COSOPT) 22.3-6.8 MG/ML ophthalmic solution Place 1 drop into the left eye 2 (two) times daily. Patient taking differently: Place 1 drop into the left eye daily.  09/28/13  Yes Bacigalupo, Dionne Bucy, MD  furosemide (LASIX) 80 MG tablet Take 80 mg (1 tabs) in am and 40 mg (1/2 tab) in pm Patient taking differently: Take 80 mg by mouth daily.  05/24/16  Yes Shirley Friar, PA-C  insulin glargine (LANTUS) 100 UNIT/ML injection Inject 10 Units into the skin at bedtime.    Yes [provider]  metFORMIN (GLUCOPHAGE) 500 MG tablet Take 1 tablet (500 mg total) by mouth 2 (two) times daily with a meal. 10/10/15  Yes Clegg, Amy D, NP  potassium chloride SA (K-DUR,KLOR-CON) 20 MEQ tablet Take 40 mEq by mouth daily.    Yes [provider]  sacubitril-valsartan (ENTRESTO) 49-51 MG Take 1 tablet  by mouth 2 (two) times daily. 08/23/16  Yes Arbutus Leas, NP  tamsulosin (FLOMAX) 0.4 MG CAPS capsule Take 1 capsule (0.4 mg total) by mouth daily after supper. 09/30/16  Yes Patrecia Pour, MD  atorvastatin (LIPITOR) 40 MG tablet Take 1 tablet (40 mg total) by mouth daily. 10/19/16   Hosie Poisson, MD  bacitracin ointment Apply topically 2 (two) times daily. 10/18/16   Hosie Poisson, MD  bisoprolol (ZEBETA) 5 MG tablet Take 1 tablet (5 mg total) by mouth daily. 10/19/16   Hosie Poisson, MD  metolazone (ZAROXOLYN) 2.5 MG tablet Take 1 tablet (2.5 mg total) by mouth daily. 10/19/16   Hosie Poisson, MD  vitamin A 10000 UNIT capsule Take 10,000 Units by mouth daily.    [provider]    Family History Family History  Problem Relation Age of Onset  . Cancer Mother   . Peripheral vascular disease Father     Social History Social History  Substance Use Topics  . Smoking status: Former Smoker    Years: 20.00    Quit date: 05/10/2000  .  Smokeless tobacco: Former Systems developer    Quit date: 02/22/2000  . Alcohol use No     Allergies   Patient has no known allergies.   Review of Systems Review of Systems All other systems negative except as documented in the HPI. All pertinent positives and negatives as reviewed in the HPI.  Physical Exam Updated Vital Signs BP 139/90 (BP Location: Right Arm)   Pulse 66   Temp 98 F (36.7 C) (Oral)   Resp 18   Ht 6' (1.829 m)   Wt 95.3 kg (210 lb)   SpO2 98%   BMI 28.48 kg/m   Physical Exam  Constitutional: He is oriented to person, place, and time. He appears well-developed and well-nourished. No distress.  HENT:  Head: Normocephalic and atraumatic.  Mouth/Throat: Oropharynx is clear and moist.  Eyes: Pupils are equal, round, and reactive to light.  Neck: Normal range of motion. Neck supple.  Cardiovascular: Normal rate, regular rhythm and normal heart sounds.  Exam reveals no gallop and no friction rub.   No murmur heard. Pulmonary/Chest:  Effort normal and breath sounds normal. No respiratory distress. He has no wheezes.  Abdominal: Soft. Bowel sounds are normal. He exhibits no distension. There is no tenderness.  Musculoskeletal: He exhibits edema.  Neurological: He is alert and oriented to person, place, and time. He exhibits normal muscle tone. Coordination normal.  Skin: Skin is warm and dry. Capillary refill takes less than 2 seconds. No rash noted. No erythema.  Psychiatric: He has a normal mood and affect. His behavior is normal.  Nursing note and vitals reviewed.    ED Treatments / Results  Labs (all labs ordered are listed, but only abnormal results are displayed) Labs Reviewed  URINE CULTURE - Abnormal; Notable for the following:       Result Value   Culture MULTIPLE SPECIES PRESENT, SUGGEST RECOLLECTION (*)    All other components within normal limits  CBC WITH DIFFERENTIAL/PLATELET - Abnormal; Notable for the following:    Hemoglobin 12.1 (*)    HCT 37.5 (*)    MCH 25.3 (*)    RDW 19.6 (*)    All other components within normal limits  BASIC METABOLIC PANEL - Abnormal; Notable for the following:    Glucose, Bld 111 (*)    BUN 28 (*)    Creatinine, Ser 1.38 (*)    Calcium 8.4 (*)    GFR calc non Af Amer 54 (*)    All other components within normal limits  TROPONIN I - Abnormal; Notable for the following:    Troponin I 0.04 (*)    All other components within normal limits  BRAIN NATRIURETIC PEPTIDE - Abnormal; Notable for the following:    B Natriuretic Peptide >4,500.0 (*)    All other components within normal limits  URINALYSIS, ROUTINE W REFLEX MICROSCOPIC - Abnormal; Notable for the following:    Hgb urine dipstick SMALL (*)    Protein, ur 100 (*)    Leukocytes, UA TRACE (*)    All other components within normal limits  HEMOGLOBIN A1C - Abnormal; Notable for the following:    Hgb A1c MFr Bld 7.2 (*)    All other components within normal limits  PROTIME-INR - Abnormal; Notable for the  following:    Prothrombin Time 17.2 (*)    All other components within normal limits  TROPONIN I - Abnormal; Notable for the following:    Troponin I 0.05 (*)    All other components within normal  limits  TROPONIN I - Abnormal; Notable for the following:    Troponin I 0.04 (*)    All other components within normal limits  GLUCOSE, CAPILLARY - Abnormal; Notable for the following:    Glucose-Capillary 121 (*)    All other components within normal limits  COMPREHENSIVE METABOLIC PANEL - Abnormal; Notable for the following:    BUN 28 (*)    Creatinine, Ser 1.38 (*)    Calcium 8.4 (*)    Total Protein 5.7 (*)    Albumin 2.0 (*)    Alkaline Phosphatase 252 (*)    Total Bilirubin 1.3 (*)    GFR calc non Af Amer 54 (*)    All other components within normal limits  CBC - Abnormal; Notable for the following:    Hemoglobin 11.4 (*)    HCT 35.1 (*)    RDW 20.1 (*)    All other components within normal limits  PROTIME-INR - Abnormal; Notable for the following:    Prothrombin Time 17.2 (*)    All other components within normal limits  TROPONIN I - Abnormal; Notable for the following:    Troponin I 0.04 (*)    All other components within normal limits  GLUCOSE, CAPILLARY - Abnormal; Notable for the following:    Glucose-Capillary 121 (*)    All other components within normal limits  GLUCOSE, CAPILLARY - Abnormal; Notable for the following:    Glucose-Capillary 29 (*)    All other components within normal limits  CBC - Abnormal; Notable for the following:    Hemoglobin 11.6 (*)    HCT 36.7 (*)    MCH 25.6 (*)    RDW 20.2 (*)    All other components within normal limits  MAGNESIUM - Abnormal; Notable for the following:    Magnesium 1.6 (*)    All other components within normal limits  BASIC METABOLIC PANEL - Abnormal; Notable for the following:    BUN 31 (*)    Creatinine, Ser 1.29 (*)    Calcium 8.2 (*)    GFR calc non Af Amer 59 (*)    All other components within normal limits    GLUCOSE, CAPILLARY - Abnormal; Notable for the following:    Glucose-Capillary 107 (*)    All other components within normal limits  GLUCOSE, CAPILLARY - Abnormal; Notable for the following:    Glucose-Capillary 130 (*)    All other components within normal limits  GLUCOSE, CAPILLARY - Abnormal; Notable for the following:    Glucose-Capillary 168 (*)    All other components within normal limits  BASIC METABOLIC PANEL - Abnormal; Notable for the following:    Chloride 99 (*)    BUN 29 (*)    Creatinine, Ser 1.34 (*)    Calcium 8.4 (*)    GFR calc non Af Amer 56 (*)    All other components within normal limits  GLUCOSE, CAPILLARY - Abnormal; Notable for the following:    Glucose-Capillary 123 (*)    All other components within normal limits  GLUCOSE, CAPILLARY - Abnormal; Notable for the following:    Glucose-Capillary 113 (*)    All other components within normal limits  GLUCOSE, CAPILLARY - Abnormal; Notable for the following:    Glucose-Capillary 129 (*)    All other components within normal limits  BASIC METABOLIC PANEL - Abnormal; Notable for the following:    Chloride 96 (*)    CO2 34 (*)    Glucose, Bld 114 (*)  BUN 29 (*)    Creatinine, Ser 1.28 (*)    Calcium 8.7 (*)    GFR calc non Af Amer 60 (*)    All other components within normal limits  GLUCOSE, CAPILLARY - Abnormal; Notable for the following:    Glucose-Capillary 159 (*)    All other components within normal limits  GLUCOSE, CAPILLARY - Abnormal; Notable for the following:    Glucose-Capillary 106 (*)    All other components within normal limits  GLUCOSE, CAPILLARY - Abnormal; Notable for the following:    Glucose-Capillary 112 (*)    All other components within normal limits  GLUCOSE, CAPILLARY - Abnormal; Notable for the following:    Glucose-Capillary 151 (*)    All other components within normal limits  CULTURE, BLOOD (ROUTINE X 2)  CULTURE, BLOOD (ROUTINE X 2)  TSH  GLUCOSE, CAPILLARY  GLUCOSE,  CAPILLARY  GLUCOSE, CAPILLARY  GLUCOSE, CAPILLARY  MAGNESIUM  GLUCOSE, CAPILLARY  CBG MONITORING, ED    EKG  EKG Interpretation  Date/Time:  Sunday October 14 2016 06:44:15 EDT Ventricular Rate:  71 PR Interval:    QRS Duration: 97 QT Interval:  420 QTC Calculation: 457 R Axis:   -55 Text Interpretation:  Sinus rhythm Abnormal R-wave progression, early transition Inferior infarct, old Lateral leads are also involved No significant change was found Confirmed by Ezequiel Essex [phone]) on 10/14/2016 6:54:12 AM       Radiology No results found.  Procedures Procedures (including critical care time)  Medications Ordered in ED Medications  morphine 4 MG/ML injection 6 mg (6 mg Intravenous Given 10/14/16 0709)  furosemide (LASIX) injection 40 mg (40 mg Intravenous Given 10/14/16 0843)  dextrose 50 % solution (50 mLs  Given 10/15/16 0743)  metolazone (ZAROXOLYN) tablet 2.5 mg (2.5 mg Oral Given 10/15/16 1713)  magnesium sulfate IVPB 4 g 100 mL (0 g Intravenous Stopped 10/16/16 1200)     Initial Impression / Assessment and Plan / ED Course  I have reviewed the triage vital signs and the nursing notes.  Pertinent labs & imaging results that were available during my care of the patient were reviewed by me and considered in my medical decision making (see chart for details).   patient will be admitted to the hospital for further evaluation and care of his significant edema.   Final Clinical Impressions(s) / ED Diagnoses   Final diagnoses:  Other hypervolemia    New Prescriptions Discharge Medication List as of 10/18/2016  3:35 PM    START taking these medications   Details  bacitracin ointment Apply topically 2 (two) times daily., Starting Thu 10/18/2016, Normal    bisoprolol (ZEBETA) 5 MG tablet Take 1 tablet (5 mg total) by mouth daily., Starting Fri 10/19/2016, Normal    metolazone (ZAROXOLYN) 2.5 MG tablet Take 1 tablet (2.5 mg total) by mouth daily., Starting Fri  10/19/2016, Normal         Dalia Heading, PA-C 10/19/16 8833    Duffy Bruce, MD 10/19/16 [phone]

## 2016-10-19 NOTE — ED Notes (Signed)
Pt is still in MRI.

## 2016-10-19 NOTE — Consult Note (Signed)
Requesting Physician: Dr. Leonette Monarch    Chief Complaint: Left arm shaking, left arm weakness, slurred speech  History obtained from: Patient and Chart   HPI:                                                                                                                                      [name] is an 61 y.o. male with a past medical history that is significant for CHF, type two diabetes, hypertension, neuropathy, right BKA presented to [facility] via EMS from home with reports of right arm shaking followed by slurred speech and right arm weakness. Per report, a caregiver (also a community paramedic) stated that she noticed a "simple partial seizure", during which [name]'s left arm began to shake. Following, she performed a quick stroke scale and noted that his left arm was weaker than his right and he was slurring his words. She called EMS and a code stroke was initiated.  [name] remembers the incident. He stated that he had never experienced anything similar in the past. He endorses some dizziness, denies headache or visual changes.  Pertinent Imaging: CT head 10/19/16: Cerebral cortical infarcts bilaterally. Small infarct left cerebellum probably chronic.  Date last known well: Date: 10/19/2016 Time last known well: Time: 09:30  tPA Given: No: No LVO  Modified Rankin Score: 4  Stroke Risk Factors - diabetes mellitus and hypertension   Past Medical History:  Diagnosis Date  . Acute on chronic systolic heart failure, NYHA class 3 ([place])   . AKI (acute kidney injury) ([place])   . Anemia   . BKA stump complication ([place])   . Blind left eye   . Blind left eye   . Cardiomyopathy, dilated ([place])   . CHF (congestive heart failure) ([place])   . Combined systolic and diastolic congestive heart failure ([place])   . Congestive dilated cardiomyopathy ([place]) 07/11/2015  . Diabetes mellitus   . Diabetic neuropathy associated with type 2 diabetes mellitus ([place])   . Hypertension   . Neuropathy    . Noncompliance with medications   . Open knee wound 10/2015   on rt bka  . PAD (peripheral artery disease) ([place])   . Protein calorie malnutrition ([place])   . Shortness of breath dyspnea     Past Surgical History:  Procedure Laterality Date  . AMPUTATION Right 09/26/2013   Procedure: AMPUTATION BELOW KNEE;  Surgeon: Rozanna Box, MD;  Location: [place];  Service: Orthopedics;  Laterality: Right;  . AMPUTATION Right 05/01/2015   Procedure: REVISION OF RIGHT TRANSTIBIAL AMPUTATION ;  Surgeon: Newt Minion, MD;  Location: [place];  Service: Orthopedics;  Laterality: Right;  . APPLICATION OF WOUND VAC  08/10/2016   Procedure: APPLICATION OFI NCISONAL  WOUND VAC;  Surgeon: Newt Minion, MD;  Location: [place];  Service: Orthopedics;;  . CARDIAC CATHETERIZATION N/A 07/13/2015   Procedure: Right/Left Heart Cath and Coronary  Angiography;  Surgeon: Jolaine Artist, MD;  Location: [place] [facility];  Service: Cardiovascular;  Laterality: N/A;  . COLONOSCOPY    . I&D EXTREMITY Right 09/24/2013   Procedure: IRRIGATION AND DEBRIDEMENT RIGHT FOOT ULCER;  Surgeon: Renette Butters, MD;  Location: [place];  Service: Orthopedics;  Laterality: Right;  . I&D EXTREMITY Right 09/26/2013   Procedure: Repeat IRRIGATION AND DEBRIDEMENT Right Foot Ulcer;  Surgeon: Rozanna Box, MD;  Location: [place];  Service: Orthopedics;  Laterality: Right;  . MULTIPLE TOOTH EXTRACTIONS    . SKIN SPLIT GRAFT Right 08/10/2016   Procedure: SKIN GRAFT SPLIT THICKNESS RIGHT LEG;  Surgeon: Newt Minion, MD;  Location: [place];  Service: Orthopedics;  Laterality: Right;  . STUMP REVISION Right 04/20/2015   Procedure: Revision Right Below Knee Amputation, Apply Wound VAC;  Surgeon: Newt Minion, MD;  Location: [place];  Service: Orthopedics;  Laterality: Right;  . TONSILLECTOMY      Family History  Problem Relation Age of Onset  . Cancer Mother   . Peripheral vascular disease Father      reports that he quit smoking about 16 years  ago. He quit after 20.00 years of use. He quit smokeless tobacco use about 16 years ago. He reports that he does not drink alcohol or use drugs.  No Known Allergies  Medications:                                                                                                                       No outpatient prescriptions have been marked as taking for the 10/19/16 encounter [facility] Encounter).    Review Of Systems:                                                                                                           History obtained from the patient  General: Negative for fever Psychological: Negative for any known behavioral disorder Ophthalmic: Blind in left eye, can see objects out of right eye. ENT: "A little" dizziness Respiratory: Negative for cough Cardiovascular: Negative for chest pain, irregular heartbeat Gastrointestinal: Negative for abdominal pain Musculoskeletal: Negative for joint swelling or muscular weakness Neurological: As noted in HPI Dermatological: Negative for rash or skin changes, numbness or tingling  Blood pressure (!) 131/96, pulse 67, temperature 98 F (36.7 C), temperature source Oral, resp. rate 10, SpO2 99 %.   Physical Examination:  General: WDWN male.  HEENT:  Normocephalic, no lesions, without obvious abnormality.  Normal external eye and conjunctiva.  Normal TM's bilaterally.  Normal auditory canals and external ears. Normal external nose, mucus membranes and septum.  Normal pharynx. Cardiovascular: regular rate and rhythm, pulses palpable throughout   Pulmonary: Unlabored breathing Abdomen: Soft MSK/Extremities: no joint deformities, effusion, or inflammation and Right BKA, Left leg wrapped to the knee.Tone and bulk normal throughout; no atrophy noted Skin: warm and dry, no hyperpigmentation, vitiligo, or suspicious lesions  Neurological  Examination:                                                                                               Mental Status: [name] is alert, oriented, thought content appropriate.  Speech dysarthric without evidence of aphasia. Able to follow 3-step commands without difficulty. Cranial Nerves: II:  Visual fields grossly normal in the right eye; blind in the left; Right pupil 77mm, round, sluggishly reactive to light. III,IV, VI: Ptosis not present, extra-ocular muscle movements intact bilaterally V,VII: Smile and eyebrow raise is symmetric. Facial light touch intact bilaterally VIII: Hearing grossly intact IX,X: Uvula and palate rise symmetrically XI: SCM and bilateral shoulder shrug strength 5/5 XII: Midline tongue extension Motor: Right :     Upper extremity   5/5   Left:     Upper extremity   5/5                 Lower extremity   5/5 Pronator drift not present Sensory: Pinprick and light touch intact throughout Deep Tendon Reflexes: 2+ and symmetric throughout BUE; diminished in the BLE Plantars: Left: downgoing Cerebellar: Finger-to-nose test without evidence of dysmetria or ataxia Gait: Deferred  Lab Results: Basic Metabolic Panel:  Recent Labs Lab 10/14/16 0556 10/15/16 0336 10/16/16 0230 10/17/16 0343 10/18/16 0436 10/19/16 1043  NA 140 141 139 139 139 135  K 4.4 3.9 4.1 4.0 4.0 6.4*  CL 107 104 103 99* 96* 94*  CO2 25 29 28 31  34*  --   GLUCOSE 111* 84 83 80 114* 104*  BUN 28* 28* 31* 29* 29* 51*  CREATININE 1.38* 1.38* 1.29* 1.34* 1.28* 1.40*  CALCIUM 8.4* 8.4* 8.2* 8.4* 8.7*  --   MG  --   --  1.6* 2.0  --   --     Liver Function Tests:  Recent Labs Lab 10/15/16 0336  AST 18  ALT 19  ALKPHOS 252*  BILITOT 1.3*  PROT 5.7*  ALBUMIN 2.0*   No results for input(s): LIPASE, AMYLASE in the last 168 hours. No results for input(s): AMMONIA in the last 168 hours.  CBC:  Recent Labs Lab 10/14/16 0556 10/15/16 0336 10/16/16 0230 10/19/16 1042  10/19/16 1043  WBC 8.7 7.8 5.9 7.1  --   NEUTROABS 7.1  --   --  5.3  --   HGB 12.1* 11.4* 11.6* 12.5* 14.3  HCT 37.5* 35.1* 36.7* 37.6* 42.0  MCV 78.3 80.5 81.0 78.2  --   PLT 318 297 322 310  --     Cardiac Enzymes:  Recent Labs Lab 10/14/16  0076 10/14/16 1522 10/14/16 2115 10/15/16 0336  TROPONINI 0.04* 0.05* 0.04* 0.04*    Lipid Panel: No results for input(s): CHOL, TRIG, HDL, CHOLHDL, VLDL, LDLCALC in the last 168 hours.  CBG:  Recent Labs Lab 10/17/16 1643 10/17/16 2058 10/18/16 0742 10/18/16 1139 10/18/16 1707  GLUCAP 129* 159* 106* 112* 151*    Microbiology: Results for orders placed or performed during the hospital encounter of 10/14/16  Urine culture     Status: Abnormal   Collection Time: 10/14/16 11:33 AM  Result Value Ref Range Status   Specimen Description URINE, CLEAN CATCH  Final   Special Requests NONE  Final   Culture MULTIPLE SPECIES PRESENT, SUGGEST RECOLLECTION (A)  Final   Report Status 10/15/2016 FINAL  Final  Culture, blood (Routine X 2) w Reflex to ID Panel     Status: None (Preliminary result)   Collection Time: 10/14/16  3:35 PM  Result Value Ref Range Status   Specimen Description BLOOD RIGHT ARM  Final   Special Requests IN PEDIATRIC BOTTLE Blood Culture adequate volume  Final   Culture NO GROWTH 4 DAYS  Final   Report Status PENDING  Incomplete  Culture, blood (Routine X 2) w Reflex to ID Panel     Status: None (Preliminary result)   Collection Time: 10/14/16  3:35 PM  Result Value Ref Range Status   Specimen Description BLOOD RIGHT WRIST  Final   Special Requests IN PEDIATRIC BOTTLE Blood Culture adequate volume  Final   Culture NO GROWTH 4 DAYS  Final   Report Status PENDING  Incomplete    Coagulation Studies: No results for input(s): LABPROT, INR in the last 72 hours.  Imaging: Ct Head Code Stroke Wo Contrast  Addendum Date: 10/19/2016   ADDENDUM REPORT: 10/19/2016 11:02 ADDENDUM: These results were called by  telephone at the time of interpretation on 10/19/2016 at 11:02 am to Dr. Lorraine Lax, who verbally acknowledged these results. Electronically Signed   By: Franchot Gallo M.D.   On: 10/19/2016 11:02   Result Date: 10/19/2016 CLINICAL DATA:  Code stroke. Focal neuro deficit less than 6 hours, stroke suspected. Slurred speech, left arm twitching EXAM: CT HEAD WITHOUT CONTRAST TECHNIQUE: Contiguous axial images were obtained from the base of the skull through the vertex without intravenous contrast. COMPARISON:  None. FINDINGS: Brain: Chronic infarcts in the left frontal and left parietal cortex. Chronic infarct in the right frontal parietal lobe over the convexity. Small chronic infarct right occipital lobe. Small hypodensity left cerebellum consistent with infarct of indeterminate age, favor chronic. Negative for hemorrhage or mass.  No definite acute infarct. Mild atrophy.  Negative for hydrocephalus Vascular: Negative for hyperdense vessel Skull: Negative Sinuses/Orbits: Negative Other: None ASPECTS ([place] Stroke Program Early CT Score) - Ganglionic level infarction (caudate, lentiform nuclei, internal capsule, insula, M1-M3 cortex): 7 - Supraganglionic infarction (M4-M6 cortex): 3 Total score (0-10 with 10 being normal): 10 IMPRESSION: 1. Cerebral cortical infarcts bilaterally. Small infarct left cerebellum probably chronic. Negative for hemorrhage 2. ASPECTS is 10 Electronically Signed: By: Franchot Gallo M.D. On: 10/19/2016 10:49     Assessment  Mr. Ovens is a 61 year old man with a significant cardiac history who presented to [facility] following an episode of left arm shaking, slurred speech and left arm weakness. This semiology is that of a simple partial seizure, rather than a stroke. However, his CT revealed several subacute strokes. It is likely that the seizure nidus is one of these locations. Consider AEDs following EEG. We will  continue with a stroke workup and EEG to evaluate further.   Plan:  1)  EEG 2) HgbA1c, fasting lipid panel 3) MRI, MRA  of the brain without contrast 4) MRA neck 5) PT consult, OT consult, Speech consult 6) Echocardiogram 7) Prophylactic therapy-Antiplatelet med: Aspirin - dose 325 8) Risk factor modification 9) Telemetry monitoring 10) Frequent neuro checks 11) NPO until swallow screen 12) Please call with question - we will continue to follow  Lupita Raider PA-C [facility] [phone]  10/19/2016, 11:10 AM   NEUROHOSPITALIST ADDENDUM Seen and examined the patient this AM. Formulated plan as documented above. Recommendations as above. Will follow.  [name] Aroor MD [facility] [phone]  If 7pm to 7am, please call on call as listed on AMION.

## 2016-10-19 NOTE — Progress Notes (Signed)
Paramedicine Encounter    Patient ID: [name], male    DOB: Jul 04, 1955, 61 y.o.   MRN: [id number]    Patient Care Team: Steve Rattler, DO as PCP - General  Patient Active Problem List   Diagnosis Date Noted  . Embolic stroke ([place]) 57/84/6962  . Cardiomyopathy, dilated ([place]) 10/19/2016  . Acute on chronic systolic heart failure, NYHA class 3 ([place]) 10/19/2016  . Moderate to severe pulmonary hypertension ([place]) 10/19/2016  . Severe tricuspid regurgitation by prior echocardiogram 10/19/2016  . Diabetes mellitus type 2 in obese ([place]) 10/19/2016  . HLD (hyperlipidemia) 10/19/2016  . Seizure ([place]) 10/19/2016  . Fluid overload 10/14/2016  . 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 Cardiorenal syndrome with renal failure 09/28/2016  . Acute urinary retention 09/26/2016  . Acute on chronic combined systolic and diastolic CHF (congestive heart failure) ([place]) 09/26/2016  . Scrotal edema 09/24/2016  . Multiple open wounds of lower leg, initial encounter 09/24/2016  . Tinea of groin 09/24/2016  . Chronic kidney disease (CKD), stage III (moderate) 06/07/2016  . Chronic combined systolic and diastolic congestive heart failure ([place])   . Protein calorie malnutrition ([place]) 07/12/2015  . Congestive dilated cardiomyopathy ([place]) 07/11/2015  . Pressure ulcer 07/09/2015  . Open knee wound 07/06/2015  . History of right below knee amputation ([place]) 02/22/2015  . PAD (peripheral artery disease) ([place]) 02/22/2015  . Blindness of left eye 01/04/2015  . Complications, amputation stump late ([place]) 08/06/2014  . Diabetic neuropathy associated with type 2 diabetes mellitus ([place]) 12/01/2013  . Noncompliance with medications 07/13/2010  . OTHER SPEC TYPES SCHIZOPHRENIA UNSPEC CONDITION 05/24/2009  . Obesity, unspecified 06/09/2007  . DM (diabetes mellitus), type 2, uncontrolled ([place]) 05/02/2006  . Essential hypertension, benign  05/02/2006   No current facility-administered medications for this visit.   Current Outpatient Prescriptions:  .  atorvastatin (LIPITOR) 40 MG tablet, Take 1 tablet (40 mg total) by mouth daily., Disp: 30 tablet, Rfl: 0 .  bacitracin ointment, Apply topically 2 (two) times daily., Disp: 120 g, Rfl: 0 .  bisoprolol (ZEBETA) 5 MG tablet, Take 1 tablet (5 mg total) by mouth daily., Disp: 30 tablet, Rfl: 1 .  brimonidine (ALPHAGAN) 0.2 % ophthalmic solution, PLACE 1 DROP INTO THE LEFT EYE 2 (TWO) TIMES DAILY. (Patient taking differently: Place 1 drop into the left eye daily. ), Disp: 5 mL, Rfl: 0 .  dorzolamide-timolol (COSOPT) 22.3-6.8 MG/ML ophthalmic solution, Place 1 drop into the left eye 2 (two) times daily. (Patient taking differently: Place 1 drop into the left eye daily. ), Disp: 10 mL, Rfl: 12 .  furosemide (LASIX) 80 MG tablet, Take 80 mg (1 tabs) in am and 40 mg (1/2 tab) in pm (Patient taking differently: Take 80 mg by mouth daily. ), Disp: 45 tablet, Rfl: 6 .  insulin glargine (LANTUS) 100 UNIT/ML injection, Inject 10 Units into the skin at bedtime. , Disp: , Rfl:  .  metFORMIN (GLUCOPHAGE) 500 MG tablet, Take 1 tablet (500 mg total) by mouth 2 (two) times daily with a meal., Disp: 60 tablet, Rfl: 6 .  metolazone (ZAROXOLYN) 2.5 MG tablet, Take 1 tablet (2.5 mg total) by mouth daily., Disp: 30 tablet, Rfl: 0 .  potassium chloride SA (K-DUR,KLOR-CON) 20 MEQ tablet, Take 40 mEq by mouth daily. , Disp: , Rfl:  .  sacubitril-valsartan (ENTRESTO) 49-51 MG, Take 1 tablet by mouth 2 (two) times daily., Disp: 60 tablet, Rfl: 3 .  tamsulosin (FLOMAX) 0.4 MG CAPS capsule, Take 1 capsule (0.4 mg total) by mouth daily after supper., Disp: 30 capsule, Rfl: 0 .  vitamin A 10000 UNIT capsule, Take 10,000 Units by mouth daily., Disp: , Rfl:   Facility-Administered Medications Ordered in Other Visits:  .  insulin aspart (novoLOG) injection 0-5 Units, 0-5 Units, Subcutaneous, QHS, Erin Hearing L, NP .   insulin aspart (novoLOG) injection 0-9 Units, 0-9 Units, Subcutaneous, TID WC, Samella Parr, NP No Known Allergies   Social History   Social History  . Marital status: Married    Spouse name: N/A  . Number of children: N/A  . Years of education: N/A   Occupational History  . Not on file.   Social History Main Topics  . Smoking status: Former Smoker    Years: 20.00    Quit date: 05/10/2000  . Smokeless tobacco: Former Systems developer    Quit date: 02/22/2000  . Alcohol use No  . Drug use: No  . Sexual activity: Not on file   Other Topics Concern  . Not on file   Social History Narrative   ** Merged History Encounter **       Lives alone.     Physical Exam      Future Appointments Date Time Provider [place]  10/25/2016 3:35 PM Marjie Skiff, MD [facility]  11/22/2016 11:00 AM [facility] PA/NP [facility] None    ATF pt [name] x4 sitting in his wheelchair watching the baseball game.  Pt had only taken last night meds due to him being in the hospital.  While I was talkiing with him about football and refilling his pill box pt stated that his hand was twitching.  I looked over at pt whom was sitting in his wheelchair and his left hand and forearm was twitching uncontrollably.  Pt does not have hx of seizures or partial seizures.  The twictching got worse within a few mins and went up to pts left shoulder.  Pt was also c/o dizziness; he never went unresponsive.  I called for an transport unit emergency traffic.  The twitching stopped but pt was unable to control movement of his left arm.  Pt became up set about me calling for an transport but I explained to him that his symptoms mimics a stroke.    Once the twitching stopped, pt's speech became slurred and his left arm was weak.  His vitals noted.  The transport initiated code stroke on pt. LSN @ 930; vitals noted.  Pt transported to [facility] [place] notified of the same.   BP (!) 156/90 (BP Location: Right Arm, Patient  Position: Sitting, Cuff Size: Normal)   Resp 16       Demetria Copper, EMT Paramedic 10/19/2016    ACTION: Home visit completed

## 2016-10-19 NOTE — Procedures (Signed)
Date of recording 10/19/2016  Referring physician Erin Hearing  Reason for the study 61 year old male with embolic strokes, now has tremor, EEG was requested to evaluate for seizures.  Technical Distal EEG recording using 10-20 international electrode system  Description of the recording The posterior dominant rhythm is 7 Hz symmetrical reactive  Sleep was not obtained. No localizing, lateralizing, interictal epileptiform features were seen during this recording.  Impression The EEG is abnormal and findings are consistent with mild generalized cerebral dysfunction. Epileptiform features were not seen during this recording.

## 2016-10-19 NOTE — ED Notes (Signed)
Patient transported to MRI 

## 2016-10-19 NOTE — ED Notes (Signed)
Patient in MRI 

## 2016-10-19 NOTE — ED Triage Notes (Signed)
Patient presents to ED with complaints of Left side weakness and slurred speech Last seen normal 0930. Patient was speaking the EMT and EMS reports speech became slurred and right arm became weak.  Patient alert and oriented on arrival . Patient able to follow commands. Patient denies any pain. Patient is a Right BKA. Patient is blind in left eye and partial in right.

## 2016-10-19 NOTE — Progress Notes (Signed)
Admitted to 5M07 via stretcher from ED, A&O x 4, able to slide from stretcher to bed, voided 236ml amber urine.  Has scrotal & penile edema, states x 2 weeks.  Dried drainage on RBKA dressing.  Tele applied & verified with CCMD, denies pain.  Patient states blindness, able to make out general body images, no fine features such as color of eyes or words / objects.

## 2016-10-19 NOTE — ED Notes (Signed)
Patient transported to EEG

## 2016-10-19 NOTE — Discharge Summary (Signed)
Physician Discharge Summary  [name] XBD:[id number] DOB: 11-12-1955 DOA: 10/14/2016  PCP: Steve Rattler, DO  Admit date: 10/14/2016 Discharge date: 10/18/2016  Admitted From: [facility].  Disposition:  Home.  Recommendations for Outpatient Follow-up:  1. Follow up with PCP in 1-2 weeks 2. Please obtain BMP/CBC in one week 3. Please follow up with Heart failure clinic as recommended.   Home Health:yes  Discharge Condition:stable.  CODE STATUS: full code.  Diet recommendation: Heart Healthy  Brief/Interim Summary: 61 year old male, wheelchair mobile, with PMH of HTN, DM, PAD status post right BKA, chronic systolic CHF due to nonischemic cardiomyopathy, 2-D echo 07/2015: LVEF 15%, stage III chronic kidney disease, blindness, multiple wounds, severe noncompliance with medications and diet (also noted by home health RN during visits), presented to the ED on 10/14/16 with increasing lower extremity and scrotal swelling, difficulty urinating, dyspnea on exertion without cough, chest pain or palpitations. Increased abdominal swelling area did decrease appetite. Admitted for decompensated CHF. Cardiology consulted 10/15/16.  Discharge Diagnoses:  Active Problems:   DM (diabetes mellitus), type 2, uncontrolled (HCC)   Obesity, unspecified   Essential hypertension, benign   Noncompliance with medications   Diabetic neuropathy associated with type 2 diabetes mellitus ([place])   Blindness of left eye   History of right below knee amputation ([place])   PAD (peripheral artery disease) (HCC)   Pressure ulcer   Congestive dilated cardiomyopathy (HCC)   Chronic kidney disease (CKD), stage III (moderate)   Scrotal edema   Multiple open wounds of lower leg, initial encounter   Acute on chronic combined systolic and diastolic CHF (congestive heart failure) (HCC)   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  Echo shows LVef of 20 to 25%.  Started on lasix and metolazone, transitioned to po on discharge.  HF team on board. Not a candidate for advanced therapies with his co morbidities.    Hypertension:  Better controlled.   Stage 3 CKD:  Creatinine at baseline.    Deconditioning:  PT/OT EVAL recommending SNF. Pt refusing the SNF, wants to go home with home health services.   Diabetes Mellitus:  CBG (last 3)   Recent Labs (last 2 labs)    Recent Labs  10/16/16 2123 10/17/16 0753 10/17/16 1106  GLUCAP 123* 84 113*      Well controlled while iinpatient. But he is non compliant to meds at home.    PAD S/P BKA on the right. :  S/p skin graft, with poor healing. Wound care consulted and recommendations given   Discharge Instructions  Discharge Instructions    (HEART FAILURE PATIENTS) Call MD:  Anytime you have any of the following symptoms: 1) 3 pound weight gain in 24 hours or 5 pounds in 1 week 2) shortness of breath, with or without a dry hacking cough 3) swelling in the hands, feet or stomach 4) if you have to sleep on extra pillows at night in order to breathe.    Complete by:  As directed    Diet - low sodium heart healthy    Complete by:  As directed    Increase activity slowly    Complete by:  As directed      Allergies as of 10/18/2016   No Known Allergies     Medication List    STOP taking these medications  carvedilol 12.5 MG tablet Commonly known as:  COREG   doxycycline 100 MG tablet Commonly known as:  VIBRA-TABS   silver sulfADIAZINE 1 % cream Commonly known as:  SILVADENE     TAKE these medications   atorvastatin 40 MG tablet Commonly known as:  LIPITOR Take 1 tablet (40 mg total) by mouth daily.   bacitracin ointment Apply topically 2 (two) times daily.   bisoprolol 5 MG tablet Commonly known as:  ZEBETA Take 1 tablet (5 mg total) by mouth  daily.   brimonidine 0.2 % ophthalmic solution Commonly known as:  ALPHAGAN PLACE 1 DROP INTO THE LEFT EYE 2 (TWO) TIMES DAILY. What changed:  how much to take  how to take this  when to take this  additional instructions   dorzolamide-timolol 22.3-6.8 MG/ML ophthalmic solution Commonly known as:  COSOPT Place 1 drop into the left eye 2 (two) times daily. What changed:  when to take this   furosemide 80 MG tablet Commonly known as:  LASIX Take 80 mg (1 tabs) in am and 40 mg (1/2 tab) in pm What changed:  how much to take  how to take this  when to take this  additional instructions   insulin glargine 100 UNIT/ML injection Commonly known as:  LANTUS Inject 10 Units into the skin at bedtime.   metFORMIN 500 MG tablet Commonly known as:  GLUCOPHAGE Take 1 tablet (500 mg total) by mouth 2 (two) times daily with a meal.   metolazone 2.5 MG tablet Commonly known as:  ZAROXOLYN Take 1 tablet (2.5 mg total) by mouth daily.   potassium chloride SA 20 MEQ tablet Commonly known as:  K-DUR,KLOR-CON Take 40 mEq by mouth daily.   sacubitril-valsartan 49-51 MG Commonly known as:  ENTRESTO Take 1 tablet by mouth 2 (two) times daily.   tamsulosin 0.4 MG Caps capsule Commonly known as:  FLOMAX Take 1 capsule (0.4 mg total) by mouth daily after supper.   vitamin A 10000 UNIT capsule Take 10,000 Units by mouth daily.      Follow-up [facility] Why:  local number # [phone], Home Health RN, aide and Social Worker. agency will call you to arrange first visit Contact information: [place] [address] [phone]        Steve Rattler, DO. Schedule an appointment as soon as possible for a visit on 10/25/2016.   Specialty:  Family Medicine Why:  AT 3:30 PM. Contact information: [place] [address] [phone]          No Known  Allergies  Consultations:  Heart failure team.    Procedures/Studies: Dg Chest 1 View  Result Date: 09/24/2016 CLINICAL DATA:  No chest c/o.  Smoker. Htn. Fluid overload EXAM: CHEST 1 VIEW COMPARISON:  10/07/2015 FINDINGS: The heart is enlarged and accentuated by the AP portable position of the patient. The lungs are free of focal consolidations and pleural effusions. No pulmonary edema. IMPRESSION: Stable cardiomegaly. Electronically Signed   By: Nolon Nations M.D.   On: 09/24/2016 14:22   Dg Chest 2 View  Result Date: 10/15/2016 CLINICAL DATA:  CHF EXAM: CHEST  2 VIEW COMPARISON:  Chest radiograph from one day prior. FINDINGS: Stable cardiomediastinal silhouette with mild enlargement of the cardiopericardial silhouette. No pneumothorax. Trace bilateral pleural effusions. Cephalization of the pulmonary vasculature without overt pulmonary edema. IMPRESSION: Stable enlargement of the  cardiopericardial silhouette without overt pulmonary edema. Trace bilateral pleural effusions. Electronically Signed   By: Ilona Sorrel M.D.   On: 10/15/2016 07:40   US Scrotum  Result Date: 09/24/2016 CLINICAL DATA:  Swelling and pain EXAM: ULTRASOUND OF SCROTUM TECHNIQUE: Complete ultrasound examination of the testicles, epididymis, and other scrotal structures was performed. COMPARISON:  07/08/2015 FINDINGS: Right testicle Measurements: 4.3 x 2.7 x 2.4 cm. No mass or microlithiasis visualized. Left testicle Measurements: 3.2 x 3.5 x 2.9 cm. No mass or microlithiasis visualized. Right epididymis:  Normal in size and appearance. Left epididymis:  Normal in size and appearance. Hydrocele:  Moderate bilateral hydroceles. Varicocele: None visualized. Marked scrotal edema and skin thickening, skin thickness on the right measures 1.7 cm and on the left 4.3 cm. No focal fluid collections within the edematous soft tissues of the scrotal sac. IMPRESSION: 1. Normal appearance of the testes 2. Large amount of scrotal edema,  left greater than right with skin thickening. 3. Moderate bilateral hydroceles Electronically Signed   By: Donavan Foil M.D.   On: 09/24/2016 19:04   Dg Chest Portable 1 View  Result Date: 10/14/2016 CLINICAL DATA:  61 y/o  M; shortness of breath. EXAM: PORTABLE CHEST 1 VIEW COMPARISON:  09/25/2016 chest radiograph FINDINGS: Stable cardiomegaly given projection and technique. Clear lungs. No pleural effusion or pneumothorax. No acute osseous abnormality is evident. IMPRESSION: Stable cardiomegaly.  No acute pulmonary process identified. Electronically Signed   By: Kristine Garbe M.D.   On: 10/14/2016 06:19   Dg Chest Port 1 View  Result Date: 09/25/2016 CLINICAL DATA:  Follow-up CHF. EXAM: PORTABLE CHEST 1 VIEW COMPARISON:  Chest x-ray of September 24, 2016 FINDINGS: The lungs are adequately inflated. There is no focal infiltrate. There is no pleural effusion. The cardiac silhouette remains mildly enlarged. The pulmonary vascularity is not engorged. There is mild tortuosity of the ascending and descending thoracic aorta. The trachea is midline. The bony thorax exhibits no acute abnormality. IMPRESSION: Stable cardiomegaly.  No acute cardiopulmonary abnormality. Electronically Signed   By: David  Martinique M.D.   On: 09/25/2016 07:09       Subjective: No new complaints,   Discharge Exam: Vitals:   10/17/16 1925 10/18/16 0351  BP: (!) 134/91 139/90  Pulse: 63 66  Resp: 18 18  Temp: 98.2 F (36.8 C) 98 F (36.7 C)  SpO2: 98% 98%   Vitals:   10/17/16 0400 10/17/16 1234 10/17/16 1925 10/18/16 0351  BP: (!) 127/94 (!) 140/94 (!) 134/91 139/90  Pulse: 77 60 63 66  Resp: 18 18 18 18   Temp: (!) 97.4 F (36.3 C) 98 F (36.7 C) 98.2 F (36.8 C) 98 F (36.7 C)  TempSrc: Oral Oral Oral Oral  SpO2: 98% 98% 98% 98%  Weight: 99.2 kg (218 lb 9.6 oz)   95.3 kg (210 lb)  Height:        General: Pt is alert, awake, not in acute distress Cardiovascular: RRR, S1/S2 +, no rubs, no  gallops Respiratory: CTA bilaterally, no wheezing, no rhonchi Abdominal: Soft, NT, ND, bowel sounds + Extremities: no edema, no cyanosis    The results of significant diagnostics from this hospitalization (including imaging, microbiology, ancillary and laboratory) are listed below for reference.     Microbiology: Recent Results (from the past 240 hour(s))  Urine culture     Status: Abnormal   Collection Time: 10/14/16 11:33 AM  Result Value Ref Range Status   Specimen Description URINE, CLEAN CATCH  Final   Special  Requests NONE  Final   Culture MULTIPLE SPECIES PRESENT, SUGGEST RECOLLECTION (A)  Final   Report Status 10/15/2016 FINAL  Final  Culture, blood (Routine X 2) w Reflex to ID Panel     Status: None (Preliminary result)   Collection Time: 10/14/16  3:35 PM  Result Value Ref Range Status   Specimen Description BLOOD RIGHT ARM  Final   Special Requests IN PEDIATRIC BOTTLE Blood Culture adequate volume  Final   Culture NO GROWTH 4 DAYS  Final   Report Status PENDING  Incomplete  Culture, blood (Routine X 2) w Reflex to ID Panel     Status: None (Preliminary result)   Collection Time: 10/14/16  3:35 PM  Result Value Ref Range Status   Specimen Description BLOOD RIGHT WRIST  Final   Special Requests IN PEDIATRIC BOTTLE Blood Culture adequate volume  Final   Culture NO GROWTH 4 DAYS  Final   Report Status PENDING  Incomplete     Labs: BNP (last 3 results)  Recent Labs  05/24/16 1034 09/24/16 1407 10/14/16 0556  BNP 2,709.6* 3,276.9* >8,338.2*   Basic Metabolic Panel:  Recent Labs Lab 10/14/16 0556 10/15/16 0336 10/16/16 0230 10/17/16 0343 10/18/16 0436  NA 140 141 139 139 139  K 4.4 3.9 4.1 4.0 4.0  CL 107 104 103 99* 96*  CO2 25 29 28 31  34*  GLUCOSE 111* 84 83 80 114*  BUN 28* 28* 31* 29* 29*  CREATININE 1.38* 1.38* 1.29* 1.34* 1.28*  CALCIUM 8.4* 8.4* 8.2* 8.4* 8.7*  MG  --   --  1.6* 2.0  --    Liver Function Tests:  Recent Labs Lab  10/15/16 0336  AST 18  ALT 19  ALKPHOS 252*  BILITOT 1.3*  PROT 5.7*  ALBUMIN 2.0*   No results for input(s): LIPASE, AMYLASE in the last 168 hours. No results for input(s): AMMONIA in the last 168 hours. CBC:  Recent Labs Lab 10/14/16 0556 10/15/16 0336 10/16/16 0230  WBC 8.7 7.8 5.9  NEUTROABS 7.1  --   --   HGB 12.1* 11.4* 11.6*  HCT 37.5* 35.1* 36.7*  MCV 78.3 80.5 81.0  PLT 318 297 322   Cardiac Enzymes:  Recent Labs Lab 10/14/16 0556 10/14/16 1522 10/14/16 2115 10/15/16 0336  TROPONINI 0.04* 0.05* 0.04* 0.04*   BNP: Invalid input(s): POCBNP CBG:  Recent Labs Lab 10/17/16 1643 10/17/16 2058 10/18/16 0742 10/18/16 1139 10/18/16 1707  GLUCAP 129* 159* 106* 112* 151*   D-Dimer No results for input(s): DDIMER in the last 72 hours. Hgb A1c No results for input(s): HGBA1C in the last 72 hours. Lipid Profile No results for input(s): CHOL, HDL, LDLCALC, TRIG, CHOLHDL, LDLDIRECT in the last 72 hours. Thyroid function studies No results for input(s): TSH, T4TOTAL, T3FREE, THYROIDAB in the last 72 hours.  Invalid input(s): FREET3 Anemia work up No results for input(s): VITAMINB12, FOLATE, FERRITIN, TIBC, IRON, RETICCTPCT in the last 72 hours. Urinalysis    Component Value Date/Time   COLORURINE YELLOW 10/14/2016 [place] 10/14/2016 0943   LABSPEC 1.013 10/14/2016 0943   PHURINE 5.0 10/14/2016 0943   GLUCOSEU NEGATIVE 10/14/2016 0943   HGBUR SMALL (A) 10/14/2016 0943   BILIRUBINUR NEGATIVE 10/14/2016 0943   KETONESUR NEGATIVE 10/14/2016 0943   PROTEINUR 100 (A) 10/14/2016 0943   UROBILINOGEN 0.2 07/17/2014 2259   NITRITE NEGATIVE 10/14/2016 0943   LEUKOCYTESUR TRACE (A) 10/14/2016 0943   Sepsis Labs Invalid input(s): PROCALCITONIN,  WBC,  LACTICIDVEN Microbiology Recent  Results (from the past 240 hour(s))  Urine culture     Status: Abnormal   Collection Time: 10/14/16 11:33 AM  Result Value Ref Range Status   Specimen  Description URINE, CLEAN CATCH  Final   Special Requests NONE  Final   Culture MULTIPLE SPECIES PRESENT, SUGGEST RECOLLECTION (A)  Final   Report Status 10/15/2016 FINAL  Final  Culture, blood (Routine X 2) w Reflex to ID Panel     Status: None (Preliminary result)   Collection Time: 10/14/16  3:35 PM  Result Value Ref Range Status   Specimen Description BLOOD RIGHT ARM  Final   Special Requests IN PEDIATRIC BOTTLE Blood Culture adequate volume  Final   Culture NO GROWTH 4 DAYS  Final   Report Status PENDING  Incomplete  Culture, blood (Routine X 2) w Reflex to ID Panel     Status: None (Preliminary result)   Collection Time: 10/14/16  3:35 PM  Result Value Ref Range Status   Specimen Description BLOOD RIGHT WRIST  Final   Special Requests IN PEDIATRIC BOTTLE Blood Culture adequate volume  Final   Culture NO GROWTH 4 DAYS  Final   Report Status PENDING  Incomplete     Time coordinating discharge: Over 30 minutes  SIGNED:   Hosie Poisson, MD  [facility] 10/19/2016, 8:57 AM Pager   If 7PM-7AM, please contact night-coverage [email or web address] Password [email or web address]

## 2016-10-19 NOTE — ED Notes (Signed)
Ordered lunch for patient

## 2016-10-19 NOTE — ED Notes (Signed)
Stage 1 ulcer noted to left second toe. Patient has wound to left leg Right knee.

## 2016-10-19 NOTE — Progress Notes (Signed)
Routine EEG completed, results pending. 

## 2016-10-19 NOTE — ED Provider Notes (Signed)
[facility] DEPT Provider Note   CSN: [id number] Arrival date & time: 10/19/16  1029   An emergency department physician performed an initial assessment on this suspected stroke patient at 1030.  History   Chief Complaint Chief Complaint  Patient presents with  . Code Stroke    HPI [name] is a 61 y.o. male.  HPI  61 year old male with an extensive past medical history listed below presents to the emergency department for possible stroke. Patient was recently admitted to the hospital for CHF exacerbation requiring diuresis in. Patient was discharged yesterday. Patient's had a EMT encounter today for hospital follow-up.During that encounter patient started having possible focal seizure to the left upper extremity which EMT noted resulted in mild weakness. Last known normal was approximately 9:30 this morning. Patient was brought in on a code stroke.   Patient has no acute complaints at this time. He states that he doesn't notice any speech deficit or left arm weakness.  Past Medical History:  Diagnosis Date  . Acute on chronic systolic heart failure, NYHA class 3 ([place])   . AKI (acute kidney injury) ([place])   . Anemia   . BKA stump complication ([place])   . Blind left eye   . Blind left eye   . Cardiomyopathy, dilated ([place])   . CHF (congestive heart failure) ([place])   . Combined systolic and diastolic congestive heart failure ([place])   . Congestive dilated cardiomyopathy ([place]) 07/11/2015  . Diabetes mellitus   . Diabetic neuropathy associated with type 2 diabetes mellitus ([place])   . Hypertension   . Neuropathy   . Noncompliance with medications   . Open knee wound 10/2015   on rt bka  . PAD (peripheral artery disease) ([place])   . Protein calorie malnutrition ([place])   . Shortness of breath dyspnea     Patient Active Problem List   Diagnosis Date Noted  . Fluid overload 10/14/2016  . 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 Cardiorenal syndrome with renal failure 09/28/2016  . Acute urinary retention 09/26/2016  . Acute on chronic combined systolic and diastolic CHF (congestive heart failure) ([place]) 09/26/2016  . Scrotal edema 09/24/2016  . Multiple open wounds of lower leg, initial encounter 09/24/2016  . Tinea of groin 09/24/2016  . Chronic kidney disease (CKD), stage III (moderate) 06/07/2016  . Chronic combined systolic and diastolic congestive heart failure ([place])   . Protein calorie malnutrition ([place]) 07/12/2015  . Congestive dilated cardiomyopathy ([place]) 07/11/2015  . Pressure ulcer 07/09/2015  . Open knee wound 07/06/2015  . History of right below knee amputation ([place]) 02/22/2015  . PAD (peripheral artery disease) ([place]) 02/22/2015  . Blindness of left eye 01/04/2015  . Complications, amputation stump late ([place]) 08/06/2014  . Diabetic neuropathy associated with type 2 diabetes mellitus ([place]) 12/01/2013  . Noncompliance with medications 07/13/2010  . OTHER SPEC TYPES SCHIZOPHRENIA UNSPEC CONDITION 05/24/2009  . Obesity, unspecified 06/09/2007  . DM (diabetes mellitus), type 2, uncontrolled ([place]) 05/02/2006  . Essential hypertension, benign 05/02/2006    Past Surgical History:  Procedure Laterality Date  . AMPUTATION Right 09/26/2013   Procedure: AMPUTATION BELOW KNEE;  Surgeon: Rozanna Box, MD;  Location: [facility];  Service: Orthopedics;  Laterality: Right;  . AMPUTATION Right 05/01/2015   Procedure: REVISION OF RIGHT TRANSTIBIAL AMPUTATION ;  Surgeon: Newt Minion, MD;  Location: [facility];  Service: Orthopedics;  Laterality: Right;  . APPLICATION OF WOUND VAC  08/10/2016   Procedure: APPLICATION OFI NCISONAL  WOUND VAC;  Surgeon: Newt Minion, MD;  Location: [facility];  Service: Orthopedics;;  . CARDIAC CATHETERIZATION N/A 07/13/2015   Procedure: Right/Left Heart Cath and Coronary Angiography;  Surgeon: Jolaine Artist, MD;  Location: [facility];  Service: Cardiovascular;   Laterality: N/A;  . COLONOSCOPY    . I&D EXTREMITY Right 09/24/2013   Procedure: IRRIGATION AND DEBRIDEMENT RIGHT FOOT ULCER;  Surgeon: Renette Butters, MD;  Location: [facility];  Service: Orthopedics;  Laterality: Right;  . I&D EXTREMITY Right 09/26/2013   Procedure: Repeat IRRIGATION AND DEBRIDEMENT Right Foot Ulcer;  Surgeon: Rozanna Box, MD;  Location: [facility];  Service: Orthopedics;  Laterality: Right;  . MULTIPLE TOOTH EXTRACTIONS    . SKIN SPLIT GRAFT Right 08/10/2016   Procedure: SKIN GRAFT SPLIT THICKNESS RIGHT LEG;  Surgeon: Newt Minion, MD;  Location: [facility];  Service: Orthopedics;  Laterality: Right;  . STUMP REVISION Right 04/20/2015   Procedure: Revision Right Below Knee Amputation, Apply Wound VAC;  Surgeon: Newt Minion, MD;  Location: [facility];  Service: Orthopedics;  Laterality: Right;  . TONSILLECTOMY         Home Medications    Prior to Admission medications   Medication Sig Start Date End Date Taking? Authorizing Provider  atorvastatin (LIPITOR) 40 MG tablet Take 1 tablet (40 mg total) by mouth daily. 10/19/16   Hosie Poisson, MD  bacitracin ointment Apply topically 2 (two) times daily. 10/18/16   Hosie Poisson, MD  bisoprolol (ZEBETA) 5 MG tablet Take 1 tablet (5 mg total) by mouth daily. 10/19/16   Hosie Poisson, MD  brimonidine (ALPHAGAN) 0.2 % ophthalmic solution PLACE 1 DROP INTO THE LEFT EYE 2 (TWO) TIMES DAILY. Patient taking differently: Place 1 drop into the left eye daily.  06/15/15   Mariel Aloe, MD  dorzolamide-timolol (COSOPT) 22.3-6.8 MG/ML ophthalmic solution Place 1 drop into the left eye 2 (two) times daily. Patient taking differently: Place 1 drop into the left eye daily.  09/28/13   Virginia Crews, MD  furosemide (LASIX) 80 MG tablet Take 80 mg (1 tabs) in am and 40 mg (1/2 tab) in pm Patient taking differently: Take 80 mg by mouth daily.  05/24/16   Shirley Friar, PA-C  insulin glargine (LANTUS) 100 UNIT/ML injection Inject 10 Units into  the skin at bedtime.     [provider]  metFORMIN (GLUCOPHAGE) 500 MG tablet Take 1 tablet (500 mg total) by mouth 2 (two) times daily with a meal. 10/10/15   Clegg, Amy D, NP  metolazone (ZAROXOLYN) 2.5 MG tablet Take 1 tablet (2.5 mg total) by mouth daily. 10/19/16   Hosie Poisson, MD  potassium chloride SA (K-DUR,KLOR-CON) 20 MEQ tablet Take 40 mEq by mouth daily.     [provider]  sacubitril-valsartan (ENTRESTO) 49-51 MG Take 1 tablet by mouth 2 (two) times daily. 08/23/16   Arbutus Leas, NP  tamsulosin (FLOMAX) 0.4 MG CAPS capsule Take 1 capsule (0.4 mg total) by mouth daily after supper. 09/30/16   Patrecia Pour, MD  vitamin A 10000 UNIT capsule Take 10,000 Units by mouth daily.    [provider]    Family History Family History  Problem Relation Age of Onset  . Cancer Mother   . Peripheral vascular disease Father     Social History Social History  Substance Use Topics  . Smoking status: Former Smoker    Years: 20.00  Quit date: 05/10/2000  . Smokeless tobacco: Former Systems developer    Quit date: 02/22/2000  . Alcohol use No     Allergies   Patient has no known allergies.   Review of Systems Review of Systems  Skin: Positive for wound (multiple skin ulcers).   All other systems are reviewed and are negative for acute change except as noted in the HPI   Physical Exam Updated Vital Signs BP (!) 130/99   Pulse (!) 58   Temp 98 F (36.7 C) (Oral)   Resp (!) 21   SpO2 99%   Physical Exam  Musculoskeletal:  Right BKA  Skin:  Multiple skin ulcers noted on bilateral lower extremities.     ED Treatments / Results  Labs (all labs ordered are listed, but only abnormal results are displayed) Labs Reviewed  CBC - Abnormal; Notable for the following:       Result Value   Hemoglobin 12.5 (*)    HCT 37.6 (*)    RDW 19.6 (*)    All other components within normal limits  DIFFERENTIAL - Abnormal; Notable for the following:    Monocytes Absolute  1.1 (*)    All other components within normal limits  URINALYSIS, ROUTINE W REFLEX MICROSCOPIC - Abnormal; Notable for the following:    Protein, ur 30 (*)    Bacteria, UA RARE (*)    All other components within normal limits  PROTIME-INR - Abnormal; Notable for the following:    Prothrombin Time 15.9 (*)    All other components within normal limits  APTT - Abnormal; Notable for the following:    aPTT 38 (*)    All other components within normal limits  COMPREHENSIVE METABOLIC PANEL - Abnormal; Notable for the following:    Chloride 96 (*)    Glucose, Bld 106 (*)    BUN 33 (*)    Creatinine, Ser 1.34 (*)    Calcium 8.5 (*)    Total Protein 6.4 (*)    Albumin 2.3 (*)    Alkaline Phosphatase 272 (*)    Total Bilirubin 1.5 (*)    GFR calc non Af Amer 56 (*)    All other components within normal limits  I-STAT CHEM 8, ED - Abnormal; Notable for the following:    Potassium 6.4 (*)    Chloride 94 (*)    BUN 51 (*)    Creatinine, Ser 1.40 (*)    Glucose, Bld 104 (*)    Calcium, Ion 0.96 (*)    All other components within normal limits  ETHANOL  RAPID URINE DRUG SCREEN, HOSP PERFORMED  I-STAT TROPONIN, ED    EKG  EKG Interpretation  Date/Time:  Friday October 19 2016 10:51:34 EDT Ventricular Rate:  62 PR Interval:    QRS Duration: 134 QT Interval:  465 QTC Calculation: 473 R Axis:   -56 Text Interpretation:  Sinus rhythm Atrial premature complex Left bundle branch block No significant change since last tracing Confirmed by Addison Lank [phone]) on 10/19/2016 10:59:14 AM       Radiology Ct Head Code Stroke Wo Contrast  Addendum Date: 10/19/2016   ADDENDUM REPORT: 10/19/2016 11:02 ADDENDUM: These results were called by telephone at the time of interpretation on 10/19/2016 at 11:02 am to Dr. Lorraine Lax, who verbally acknowledged these results. Electronically Signed   By: Franchot Gallo M.D.   On: 10/19/2016 11:02   Result Date: 10/19/2016 CLINICAL DATA:  Code stroke. Focal  neuro deficit less than 6 hours, stroke suspected.  Slurred speech, left arm twitching EXAM: CT HEAD WITHOUT CONTRAST TECHNIQUE: Contiguous axial images were obtained from the base of the skull through the vertex without intravenous contrast. COMPARISON:  None. FINDINGS: Brain: Chronic infarcts in the left frontal and left parietal cortex. Chronic infarct in the right frontal parietal lobe over the convexity. Small chronic infarct right occipital lobe. Small hypodensity left cerebellum consistent with infarct of indeterminate age, favor chronic. Negative for hemorrhage or mass.  No definite acute infarct. Mild atrophy.  Negative for hydrocephalus Vascular: Negative for hyperdense vessel Skull: Negative Sinuses/Orbits: Negative Other: None ASPECTS ([place] Stroke Program Early CT Score) - Ganglionic level infarction (caudate, lentiform nuclei, internal capsule, insula, M1-M3 cortex): 7 - Supraganglionic infarction (M4-M6 cortex): 3 Total score (0-10 with 10 being normal): 10 IMPRESSION: 1. Cerebral cortical infarcts bilaterally. Small infarct left cerebellum probably chronic. Negative for hemorrhage 2. ASPECTS is 10 Electronically Signed: By: Franchot Gallo M.D. On: 10/19/2016 10:49    Procedures Procedures (including critical care time)  Medications Ordered in ED Medications - No data to display   Initial Impression / Assessment and Plan / ED Course  I have reviewed the triage vital signs and the nursing notes.  Pertinent labs & imaging results that were available during my care of the patient were reviewed by me and considered in my medical decision making (see chart for details).     She does have slurred speech but left arm weakness has improved. CT head notable for old strokes no acute findings. Labs grossly reassuring and the patient's baseline.  Neurology evaluated the patient and recommended hospitalist admission for stroke workup including MRI and carotids. They will also arrange for EEG  for possible seizure.  Final Clinical Impressions(s) / ED Diagnoses   Final diagnoses:  Slurred speech  Left-sided weakness  Ulcers of both lower extremities, limited to breakdown of skin ([place])      Cardama, Grayce Sessions, MD 10/19/16 1254

## 2016-10-19 NOTE — ED Notes (Signed)
Pt was given meal tray, warmed it up for him.  Pt was able to eat this without any difficulty.  Pt is alert and oriented at this time

## 2016-10-19 NOTE — H&P (Signed)
History and Physical    [name] DOB: 21-May-1955 DOA: 10/19/2016   PCP: Steve Rattler, DO   Attending physician: Aggie Moats  Patient coming from/Resides with: Private residence/alone  Chief Complaint: Left-sided weakness and slurred speech  HPI: [name] is a 61 y.o. male with medical history significant for diabetes mellitus 2 on insulin and metformin, hypertension, peripheral vascular disease and prior right BKA, nonischemic cardiopathy with underlying systolic heart failure, moderate to severe pulmonary hypertension with associated moderate to severe tricuspid regurgitation. Patient recently discharged on 8/16 after an admission for heart failure. EMT went to patient's home for routine initial post 24 hour follow-up visit and noticed that the patient was experiencing left arm shaking and left arm weakness. He also appeared to be dysarthric. Patient denies slurred speech or dysarthria. Patient reports shaking lasted several minutes. Has never had before. No numbness or pain in upper extremity. Code stroke called upon arrival to the ER. Patient underwent CT of the head which revealed cerebral cortical infarcts bilaterally. There was also a small infarct left cerebellum probably chronic but no signs of hemorrhage. Neurology was consulted. Evaluation regarding new seizures and possible evolving stroke in process.  ED Course:  Vital Signs: BP (!) 130/99   Pulse 62   Temp 98 F (36.7 C) (Oral)   Resp 12   SpO2 100%  CT head without contrast: As above Lab data: Sodium 138, potassium 4.2, chloride 96, CO2 30, glucose 106, BUN 33, creatinine 1.34, alkaline phosphatase 272, LFTs normal except for mildly elevated total bilirubin 1.5, white count 7100 with normal differential, hemoglobin 12.5, platelets 310,000, PT 15.9, INR 1.26, PTT 38, urinalysis unremarkable except for rare bacteria and 30 protein, UDS negative, alcohol <5 Medications and treatments: None  Review of  Systems:  In addition to the HPI above,  No Fever-chills, myalgias or other constitutional symptoms No Headache, changes with Vision or hearing, new focal weakness, tingling, numbness in any extremity, dizziness, unable to appreciate if gait disturbance or imbalance and patient is wheelchair-bound secondary to prior BKA No problems swallowing food or Liquids, indigestion/reflux, choking or coughing while eating, abdominal pain with or after eating No Chest pain, Cough or Shortness of Breath, palpitations, orthopnea or DOE No Abdominal pain, N/V, melena,hematochezia, dark tarry stools, constipation No dysuria, malodorous urine, hematuria or flank pain No new skin rashes, lesions, masses or bruises, No new joint pains, aches, swelling or redness No recent unintentional weight gain or loss No polyuria, polydypsia or polyphagia   Past Medical History:  Diagnosis Date  . Acute on chronic systolic heart failure, NYHA class 3 ([place])   . AKI (acute kidney injury) ([place])   . Anemia   . BKA stump complication ([place])   . Blind left eye   . Blind left eye   . Cardiomyopathy, dilated ([place])   . CHF (congestive heart failure) ([place])   . Combined systolic and diastolic congestive heart failure ([place])   . Congestive dilated cardiomyopathy ([place]) 07/11/2015  . Diabetes mellitus   . Diabetic neuropathy associated with type 2 diabetes mellitus ([place])   . Hypertension   . Neuropathy   . Noncompliance with medications   . Open knee wound 10/2015   on rt bka  . PAD (peripheral artery disease) ([place])   . Protein calorie malnutrition ([place])   . Shortness of breath dyspnea     Past Surgical History:  Procedure Laterality Date  . AMPUTATION Right 09/26/2013   Procedure: AMPUTATION BELOW KNEE;  Surgeon:  Rozanna Box, MD;  Location: [facility];  Service: Orthopedics;  Laterality: Right;  . AMPUTATION Right 05/01/2015   Procedure: REVISION OF RIGHT TRANSTIBIAL AMPUTATION ;  Surgeon: Newt Minion, MD;  Location: [facility];  Service: Orthopedics;  Laterality: Right;  . APPLICATION OF WOUND VAC  08/10/2016   Procedure: APPLICATION OFI NCISONAL  WOUND VAC;  Surgeon: Newt Minion, MD;  Location: [facility];  Service: Orthopedics;;  . CARDIAC CATHETERIZATION N/A 07/13/2015   Procedure: Right/Left Heart Cath and Coronary Angiography;  Surgeon: Jolaine Artist, MD;  Location: [facility];  Service: Cardiovascular;  Laterality: N/A;  . COLONOSCOPY    . I&D EXTREMITY Right 09/24/2013   Procedure: IRRIGATION AND DEBRIDEMENT RIGHT FOOT ULCER;  Surgeon: Renette Butters, MD;  Location: [facility];  Service: Orthopedics;  Laterality: Right;  . I&D EXTREMITY Right 09/26/2013   Procedure: Repeat IRRIGATION AND DEBRIDEMENT Right Foot Ulcer;  Surgeon: Rozanna Box, MD;  Location: [facility];  Service: Orthopedics;  Laterality: Right;  . MULTIPLE TOOTH EXTRACTIONS    . SKIN SPLIT GRAFT Right 08/10/2016   Procedure: SKIN GRAFT SPLIT THICKNESS RIGHT LEG;  Surgeon: Newt Minion, MD;  Location: [facility];  Service: Orthopedics;  Laterality: Right;  . STUMP REVISION Right 04/20/2015   Procedure: Revision Right Below Knee Amputation, Apply Wound VAC;  Surgeon: Newt Minion, MD;  Location: [facility];  Service: Orthopedics;  Laterality: Right;  . TONSILLECTOMY      Social History   Social History  . Marital status: Married    Spouse name: N/A  . Number of children: N/A  . Years of education: N/A   Occupational History  . Not on file.   Social History Main Topics  . Smoking status: Former Smoker    Years: 20.00    Quit date: 05/10/2000  . Smokeless tobacco: Former Systems developer    Quit date: 02/22/2000  . Alcohol use No  . Drug use: No  . Sexual activity: Not on file   Other Topics Concern  . Not on file   Social History Narrative   ** Merged History Encounter **       Lives alone.     Mobility: Wheelchair dependent Work history: Disabled   No Known Allergies  Family History  Problem Relation Age of Onset  . Cancer Mother     . Peripheral vascular disease Father      Prior to Admission medications   Medication Sig Start Date End Date Taking? Authorizing Provider  atorvastatin (LIPITOR) 40 MG tablet Take 1 tablet (40 mg total) by mouth daily. 10/19/16  Yes Hosie Poisson, MD  bisoprolol (ZEBETA) 5 MG tablet Take 1 tablet (5 mg total) by mouth daily. 10/19/16  Yes Hosie Poisson, MD  furosemide (LASIX) 80 MG tablet Take 80 mg (1 tabs) in am and 40 mg (1/2 tab) in pm Patient taking differently: Take 80 mg by mouth daily.  05/24/16  Yes Shirley Friar, PA-C  insulin glargine (LANTUS) 100 UNIT/ML injection Inject 10 Units into the skin at bedtime.    Yes [provider]  metFORMIN (GLUCOPHAGE) 500 MG tablet Take 1 tablet (500 mg total) by mouth 2 (two) times daily with a meal. 10/10/15  Yes Clegg, Amy D, NP  metolazone (ZAROXOLYN) 2.5 MG tablet Take 1 tablet (2.5 mg total) by mouth daily. 10/19/16  Yes Hosie Poisson, MD  potassium chloride SA (K-DUR,KLOR-CON) 20 MEQ tablet Take 40 mEq by mouth daily.  Yes [provider]  sacubitril-valsartan (ENTRESTO) 49-51 MG Take 1 tablet by mouth 2 (two) times daily. 08/23/16  Yes Arbutus Leas, NP  tamsulosin (FLOMAX) 0.4 MG CAPS capsule Take 1 capsule (0.4 mg total) by mouth daily after supper. 09/30/16  Yes Patrecia Pour, MD  bacitracin ointment Apply topically 2 (two) times daily. 10/18/16   Hosie Poisson, MD  brimonidine (ALPHAGAN) 0.2 % ophthalmic solution PLACE 1 DROP INTO THE LEFT EYE 2 (TWO) TIMES DAILY. Patient taking differently: Place 1 drop into the left eye daily.  06/15/15   Mariel Aloe, MD  dorzolamide-timolol (COSOPT) 22.3-6.8 MG/ML ophthalmic solution Place 1 drop into the left eye 2 (two) times daily. Patient taking differently: Place 1 drop into the left eye daily.  09/28/13   Virginia Crews, MD  vitamin A 10000 UNIT capsule Take 10,000 Units by mouth daily.    [provider]    Physical Exam: Vitals:   10/19/16 1100  10/19/16 1130 10/19/16 1255 10/19/16 1257  BP: (!) 137/95 (!) 130/99    Pulse: 70 (!) 58 67 62  Resp: 17 (!) 21 10 12   Temp:      TempSrc:      SpO2:  99% 95% 100%      Constitutional: NAD, calm, comfortable-Asking me whether or not he can go home today Eyes: PERRL on right, left eye with cataract, lids and conjunctivae normal ENMT: Mucous membranes are moist. Posterior pharynx clear of any exudate or lesions.poor dentition.  Neck: normal, supple, no masses, no thyromegaly Respiratory: clear to auscultation bilaterally, no wheezing, no crackles. Normal respiratory effort. No accessory muscle use.  Cardiovascular: Regular rate and rhythm, no murmurs / rubs / gallops. No extremity edema. 1+ pedal pulses on left. No carotid bruits.  Abdomen: no tenderness, no masses palpated. No hepatosplenomegaly. Bowel sounds positive.  Musculoskeletal: no clubbing / cyanosis. No joint deformity upper and lower extremities. Good ROM, no contractures. Normal muscle tone. Prior right BKA-dressing has an area of fresh blood Skin: no rashes, lesions, ulcers. No induration Neurologic: CN 2-12 grossly intact. Speech seems somewhat slow to respond Sensation intact, DTR normal. Strength 4/5 RUE, LUE and LLE-prior BKA on right so difficult to adequately assess strength although appears to be 4/5 Psychiatric: Normal judgment and insight. Alert and oriented x 3. Flat mood.    Labs on Admission: I have personally reviewed following labs and imaging studies  CBC:  Recent Labs Lab 10/14/16 0556 10/15/16 0336 10/16/16 0230 10/19/16 1042 10/19/16 1043  WBC 8.7 7.8 5.9 7.1  --   NEUTROABS 7.1  --   --  5.3  --   HGB 12.1* 11.4* 11.6* 12.5* 14.3  HCT 37.5* 35.1* 36.7* 37.6* 42.0  MCV 78.3 80.5 81.0 78.2  --   PLT 318 297 322 310  --    Basic Metabolic Panel:  Recent Labs Lab 10/15/16 0336 10/16/16 0230 10/17/16 0343 10/18/16 0436 10/19/16 1043 10/19/16 1103  NA 141 139 139 139 135 138  K 3.9 4.1 4.0  4.0 6.4* 4.2  CL 104 103 99* 96* 94* 96*  CO2 29 28 31  34*  --  30  GLUCOSE 84 83 80 114* 104* 106*  BUN 28* 31* 29* 29* 51* 33*  CREATININE 1.38* 1.29* 1.34* 1.28* 1.40* 1.34*  CALCIUM 8.4* 8.2* 8.4* 8.7*  --  8.5*  MG  --  1.6* 2.0  --   --   --    GFR: Estimated Creatinine Clearance: 71.1 mL/min (A) (  by C-G formula based on SCr of 1.34 mg/dL (H)). Liver Function Tests:  Recent Labs Lab 10/15/16 0336 10/19/16 1103  AST 18 23  ALT 19 17  ALKPHOS 252* 272*  BILITOT 1.3* 1.5*  PROT 5.7* 6.4*  ALBUMIN 2.0* 2.3*   No results for input(s): LIPASE, AMYLASE in the last 168 hours. No results for input(s): AMMONIA in the last 168 hours. Coagulation Profile:  Recent Labs Lab 10/14/16 1522 10/15/16 0336 10/19/16 1103  INR 1.39 1.39 1.26   Cardiac Enzymes:  Recent Labs Lab 10/14/16 0556 10/14/16 1522 10/14/16 2115 10/15/16 0336  TROPONINI 0.04* 0.05* 0.04* 0.04*   BNP (last 3 results) No results for input(s): PROBNP in the last 8760 hours. HbA1C: No results for input(s): HGBA1C in the last 72 hours. CBG:  Recent Labs Lab 10/17/16 1643 10/17/16 2058 10/18/16 0742 10/18/16 1139 10/18/16 1707  GLUCAP 129* 159* 106* 112* 151*   Lipid Profile: No results for input(s): CHOL, HDL, LDLCALC, TRIG, CHOLHDL, LDLDIRECT in the last 72 hours. Thyroid Function Tests: No results for input(s): TSH, T4TOTAL, FREET4, T3FREE, THYROIDAB in the last 72 hours. Anemia Panel: No results for input(s): VITAMINB12, FOLATE, FERRITIN, TIBC, IRON, RETICCTPCT in the last 72 hours. Urine analysis:    Component Value Date/Time   COLORURINE YELLOW 10/19/2016 [place] 10/19/2016 1145   LABSPEC 1.012 10/19/2016 1145   PHURINE 6.0 10/19/2016 1145   GLUCOSEU NEGATIVE 10/19/2016 1145   HGBUR NEGATIVE 10/19/2016 1145   BILIRUBINUR NEGATIVE 10/19/2016 1145   KETONESUR NEGATIVE 10/19/2016 1145   PROTEINUR 30 (A) 10/19/2016 1145   UROBILINOGEN 0.2 07/17/2014 2259   NITRITE  NEGATIVE 10/19/2016 1145   LEUKOCYTESUR NEGATIVE 10/19/2016 1145   Sepsis Labs: @LABRCNTIP (procalcitonin:4,lacticidven:4) ) Recent Results (from the past 240 hour(s))  Urine culture     Status: Abnormal   Collection Time: 10/14/16 11:33 AM  Result Value Ref Range Status   Specimen Description URINE, CLEAN CATCH  Final   Special Requests NONE  Final   Culture MULTIPLE SPECIES PRESENT, SUGGEST RECOLLECTION (A)  Final   Report Status 10/15/2016 FINAL  Final  Culture, blood (Routine X 2) w Reflex to ID Panel     Status: None (Preliminary result)   Collection Time: 10/14/16  3:35 PM  Result Value Ref Range Status   Specimen Description BLOOD RIGHT ARM  Final   Special Requests IN PEDIATRIC BOTTLE Blood Culture adequate volume  Final   Culture NO GROWTH 4 DAYS  Final   Report Status PENDING  Incomplete  Culture, blood (Routine X 2) w Reflex to ID Panel     Status: None (Preliminary result)   Collection Time: 10/14/16  3:35 PM  Result Value Ref Range Status   Specimen Description BLOOD RIGHT WRIST  Final   Special Requests IN PEDIATRIC BOTTLE Blood Culture adequate volume  Final   Culture NO GROWTH 4 DAYS  Final   Report Status PENDING  Incomplete     Radiological Exams on Admission: Ct Head Code Stroke Wo Contrast  Addendum Date: 10/19/2016   ADDENDUM REPORT: 10/19/2016 11:02 ADDENDUM: These results were called by telephone at the time of interpretation on 10/19/2016 at 11:02 am to Dr. Lorraine Lax, who verbally acknowledged these results. Electronically Signed   By: Franchot Gallo M.D.   On: 10/19/2016 11:02   Result Date: 10/19/2016 CLINICAL DATA:  Code stroke. Focal neuro deficit less than 6 hours, stroke suspected. Slurred speech, left arm twitching EXAM: CT HEAD WITHOUT CONTRAST TECHNIQUE: Contiguous axial images were  obtained from the base of the skull through the vertex without intravenous contrast. COMPARISON:  None. FINDINGS: Brain: Chronic infarcts in the left frontal and left parietal  cortex. Chronic infarct in the right frontal parietal lobe over the convexity. Small chronic infarct right occipital lobe. Small hypodensity left cerebellum consistent with infarct of indeterminate age, favor chronic. Negative for hemorrhage or mass.  No definite acute infarct. Mild atrophy.  Negative for hydrocephalus Vascular: Negative for hyperdense vessel Skull: Negative Sinuses/Orbits: Negative Other: None ASPECTS ([place] Stroke Program Early CT Score) - Ganglionic level infarction (caudate, lentiform nuclei, internal capsule, insula, M1-M3 cortex): 7 - Supraganglionic infarction (M4-M6 cortex): 3 Total score (0-10 with 10 being normal): 10 IMPRESSION: 1. Cerebral cortical infarcts bilaterally. Small infarct left cerebellum probably chronic. Negative for hemorrhage 2. ASPECTS is 10 Electronically Signed: By: Franchot Gallo M.D. On: 10/19/2016 10:49    EKG: (Independently reviewed) sinus rhythm with ventricular rate 62 bpm, QTC 473 ms, inverted somewhat downsloping T waves in several leads consistent with LV strain  Assessment/Plan Principal Problem:   Embolic stroke  -Patient presents with dysarthria and focal left arm weakness with associated tremors concerning for possible new onset seizure activity as well as possible new stroke. -CT head without contrast demonstrates cerebral cortical infarcts bilaterally with a small infarct in the left cerebellum probably chronic -Neuro following -MRA head and neck, MRI brain -Carotid duplex -Likely needs TEE-deferred to neurology-recent echocardiogram last month -Frequent neurology checks -Hemoglobin A1c completed recent admission-obtain lipid panel and continue on statin -PT/OT/SLP evaluation -Aspirin  Active Problems:   Seizure  -Focal left upper extremity tremors consistent with likely seizure activity possibly precipitated by areas of old stroke -EEG as recommended by neurology -Seizure precautions    Cardiomyopathy, dilated/Chronic systolic  heart failure, NYHA class 3/Moderate to severe pulmonary hypertension/Severe tricuspid regurgitation by prior echocardiogram -Just discharged on 8/16 after admission for heart exacerbation secondary to noncompliance with medications -Evaluated by heart failure team who documented patient was not a candidate for advanced therapies with his comorbidities -Continue preadmission Lasix, metolazone and Entresto -Continue beta blocker -Daily weights; strict I/O    Diabetes mellitus type 2 in obese  -Continue Lantus but hold metformin -SSI -HgbA1c was 7.2 on 10/14/16    HLD (hyperlipidemia) -Continue statin      DVT prophylaxis: Lovenox Code Status: Full  Family Communication: None Disposition Plan: Home Consults called: Neuro hospitalist     ELLIS,ALLISON L. ANP-BC [facility] Pager [phone]   If 7PM-7AM, please contact night-coverage [email or web address] Password [email or web address]  10/19/2016, 1:25 PM

## 2016-10-20 ENCOUNTER — Encounter (HOSPITAL_COMMUNITY): Payer: Medicaid Other

## 2016-10-20 ENCOUNTER — Encounter (HOSPITAL_COMMUNITY): Payer: Self-pay

## 2016-10-20 DIAGNOSIS — I272 Pulmonary hypertension, unspecified: Secondary | ICD-10-CM

## 2016-10-20 DIAGNOSIS — E785 Hyperlipidemia, unspecified: Secondary | ICD-10-CM

## 2016-10-20 DIAGNOSIS — L97911 Non-pressure chronic ulcer of unspecified part of right lower leg limited to breakdown of skin: Secondary | ICD-10-CM

## 2016-10-20 DIAGNOSIS — L97921 Non-pressure chronic ulcer of unspecified part of left lower leg limited to breakdown of skin: Secondary | ICD-10-CM

## 2016-10-20 DIAGNOSIS — I63133 Cerebral infarction due to embolism of bilateral carotid arteries: Secondary | ICD-10-CM

## 2016-10-20 DIAGNOSIS — I420 Dilated cardiomyopathy: Secondary | ICD-10-CM

## 2016-10-20 DIAGNOSIS — I071 Rheumatic tricuspid insufficiency: Secondary | ICD-10-CM

## 2016-10-20 DIAGNOSIS — I5023 Acute on chronic systolic (congestive) heart failure: Secondary | ICD-10-CM

## 2016-10-20 LAB — CBC WITH DIFFERENTIAL/PLATELET
Basophils Absolute: 0 10*3/uL (ref 0.0–0.1)
Basophils Relative: 0 %
Eosinophils Absolute: 0 10*3/uL (ref 0.0–0.7)
Eosinophils Relative: 1 %
HCT: 40.3 % (ref 39.0–52.0)
Hemoglobin: 13.1 g/dL (ref 13.0–17.0)
Lymphocytes Relative: 13 %
Lymphs Abs: 0.8 10*3/uL (ref 0.7–4.0)
MCH: 25.9 pg — ABNORMAL LOW (ref 26.0–34.0)
MCHC: 32.5 g/dL (ref 30.0–36.0)
MCV: 79.6 fL (ref 78.0–100.0)
Monocytes Absolute: 0.9 10*3/uL (ref 0.1–1.0)
Monocytes Relative: 13 %
Neutro Abs: 4.7 10*3/uL (ref 1.7–7.7)
Neutrophils Relative %: 73 %
Platelets: 366 10*3/uL (ref 150–400)
RBC: 5.06 MIL/uL (ref 4.22–5.81)
RDW: 19.2 % — ABNORMAL HIGH (ref 11.5–15.5)
WBC: 6.4 10*3/uL (ref 4.0–10.5)

## 2016-10-20 LAB — GLUCOSE, CAPILLARY
Glucose-Capillary: 63 mg/dL — ABNORMAL LOW (ref 65–99)
Glucose-Capillary: 48 mg/dL — ABNORMAL LOW (ref 65–99)
Glucose-Capillary: 121 mg/dL — ABNORMAL HIGH (ref 65–99)
Glucose-Capillary: 86 mg/dL (ref 65–99)
Glucose-Capillary: 100 mg/dL — ABNORMAL HIGH (ref 65–99)

## 2016-10-20 LAB — COMPREHENSIVE METABOLIC PANEL
ALT: 21 U/L (ref 17–63)
AST: 28 U/L (ref 15–41)
Albumin: 2.5 g/dL — ABNORMAL LOW (ref 3.5–5.0)
Alkaline Phosphatase: 288 U/L — ABNORMAL HIGH (ref 38–126)
Anion gap: 10 (ref 5–15)
BUN: 29 mg/dL — ABNORMAL HIGH (ref 6–20)
CO2: 34 mmol/L — ABNORMAL HIGH (ref 22–32)
Calcium: 8.6 mg/dL — ABNORMAL LOW (ref 8.9–10.3)
Chloride: 93 mmol/L — ABNORMAL LOW (ref 101–111)
Creatinine, Ser: 1.26 mg/dL — ABNORMAL HIGH (ref 0.61–1.24)
GFR calc Af Amer: >60 mL/min (ref >60)
GFR calc non Af Amer: >60 mL/min (ref >60)
Glucose, Bld: 80 mg/dL (ref 65–99)
Potassium: 3.4 mmol/L — ABNORMAL LOW (ref 3.5–5.1)
Sodium: 137 mmol/L (ref 135–145)
Total Bilirubin: 1.5 mg/dL — ABNORMAL HIGH (ref 0.3–1.2)
Total Protein: 6.7 g/dL (ref 6.5–8.1)

## 2016-10-20 LAB — PHOSPHORUS: Phosphorus: 4 mg/dL (ref 2.5–4.6)

## 2016-10-20 LAB — LIPID PANEL
Cholesterol: 98 mg/dL (ref 0–200)
HDL: 46 mg/dL (ref >40)
LDL Cholesterol: 45 mg/dL (ref 0–99)
Total CHOL/HDL Ratio: 2.1 RATIO
Triglycerides: 34 mg/dL (ref <150)
VLDL: 7 mg/dL (ref 0–40)

## 2016-10-20 LAB — PROTIME-INR
INR: 1.36
Prothrombin Time: 16.8 seconds — ABNORMAL HIGH (ref 11.4–15.2)

## 2016-10-20 LAB — MAGNESIUM: Magnesium: 1.9 mg/dL (ref 1.7–2.4)

## 2016-10-20 MED ORDER — ASPIRIN 325 MG PO TABS: 325.0000 mg | ORAL_TABLET | Freq: Every day | ORAL | 0 refills | Status: DC

## 2016-10-20 MED ORDER — LEVETIRACETAM 750 MG PO TABS
750.0000 mg | ORAL_TABLET | Freq: Two times a day (BID) | ORAL | Status: DC
  Administered 2016-10-20: 750 mg via ORAL
  Filled 2016-10-20 (×2): qty 1

## 2016-10-20 MED ORDER — LEVETIRACETAM 750 MG PO TABS: 750.0000 mg | ORAL_TABLET | Freq: Two times a day (BID) | ORAL | 0 refills | Status: AC

## 2016-10-20 NOTE — Progress Notes (Signed)
)  630 CGB was 63 this am, no physical symptoms.  Grape juice given, patient cooperative / agreeable.  Will recheck again prior to 0730 b'fast.

## 2016-10-20 NOTE — Progress Notes (Signed)
Reason for consult: L arm jerking   Subjective: patient back to baseline    ROS: negative except above  Examination  Vital signs in last 24 hours: Temp:  [97.4 F (36.3 C)-98.8 F (37.1 C)] 97.9 F (36.6 C) (08/18 1739) Pulse Rate:  [55-72] 69 (08/18 1739) Resp:  [17-20] 20 (08/18 1739) BP: (118-141)/(88-100) 131/99 (08/18 1739) SpO2:  [87 %-100 %] 95 % (08/18 1739)     Neurological Examination:                                                                                               Mental Status: [name] is alert, oriented, thought content appropriate.  Speech dysarthric without evidence of aphasia. Able to follow 3-step commands without difficulty. Cranial Nerves: R eye blind, diminished visual acuity in left eye Motor: Right :     Upper extremity   5/5                              Left:     Upper extremity   5/5                                                                                                 Lower extremity   5/5 Pronator drift not present Sensory: Pinprick and light touch intact throughout Deep Tendon Reflexes: 2+ and symmetric throughout BUE; diminished in the BLE Plantars: Left: downgoing Cerebellar: Finger-to-nose test without evidence of dysmetria or ataxia Gait: Deferred   Basic Metabolic Panel:  Recent Labs Lab 10/16/16 0230 10/17/16 0343 10/18/16 0436 10/19/16 1043 10/19/16 1103 10/20/16 0951  NA 139 139 139 135 138 137  K 4.1 4.0 4.0 6.4* 4.2 3.4*  CL 103 99* 96* 94* 96* 93*  CO2 28 31 34*  --  30 34*  GLUCOSE 83 80 114* 104* 106* 80  BUN 31* 29* 29* 51* 33* 29*  CREATININE 1.29* 1.34* 1.28* 1.40* 1.34* 1.26*  CALCIUM 8.2* 8.4* 8.7*  --  8.5* 8.6*  MG 1.6* 2.0  --   --   --  1.9  PHOS  --   --   --   --   --  4.0    CBC:  Recent Labs Lab 10/14/16 0556 10/15/16 0336 10/16/16 0230 10/19/16 1042 10/19/16 1043 10/20/16 0951  WBC 8.7 7.8 5.9 7.1  --  6.4  NEUTROABS 7.1  --   --  5.3  --  4.7  HGB 12.1* 11.4* 11.6*  12.5* 14.3 13.1  HCT 37.5* 35.1* 36.7* 37.6* 42.0 40.3  MCV 78.3 80.5 81.0 78.2  --  79.6  PLT 318 297 322 310  --  366  Coagulation Studies:  Recent Labs  10/19/16 1103 10/20/16 0951  LABPROT 15.9* 16.8*  INR 1.26 1.36    Imaging Reviewed:   MRI HEAD FINDINGS  Brain: No acute infarction, hemorrhage, hydrocephalus, extra-axial collection or mass lesion.  Small anterior and moderate posterior high right frontal infarcts. Small remote cortically based infarcts in the anterior left frontal and left parietal lobes. Most of these infarcts show hemosiderin staining from old hemorrhage. Hazy T2 hyperintensity in the pons which may be chronic microvascular ischemic change, also seen around the lateral ventricles. No peripheral cerebellar or deep gray nucleus signal changes. Remote small infarct in the inferior left cerebellum.  Vascular: Arterial findings below. Normal dural venous sinus flow voids.  Skull and upper cervical spine: No evidence of marrow lesion  Sinuses/Orbits: Symmetric prominent size of superior ophthalmic veins which may be transient. No abnormal or asymmetric findings along the cavernous sinus.  MRA HEAD FINDINGS  Diffuse motion limitation, at some levels moderately so. The carotid, dominant right vertebral, basilar, and proximal ACA/ MCA/ PCA vessels are widely patent. No noted branch occlusion, beading, or aneurysm. 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  MRA NECK FINDINGS  Motion degraded. Coverage begins at the upper common carotid artery. There is antegrade flow in both internal carotid arteries without convincing stenosisotid. Hypoplastic left vertebral artery which is not seen by the V3 segment. The visible right vertebral artery is patent.  IMPRESSION: 1. Motion degraded brain MRI without acute finding. 2. Remote bilateral frontal and left parietal small to moderate cortically based  infarcts. Some of these were previously hemorrhagic. No other finding to explain seizure history. 3. Hazy T2 signal in the pons, favor chronic microvascular ischemic injury. 4. Motion degraded neck and intracranial MRA. Accounting for hypoplastic left vertebral artery, no emergent large vessel occlusion or other acute finding.   ASSESSMENT AND PLAN  [name] is a 57 y M with PMH of CHF,HTN, DM, PAD status post right BKA with chronic cortical infarcts who presents after have witnesses left arm jerking without LOC . No new strokes seen on MRI. The right frontal infarct shows old hemorrhagic conversion which could be epileptogenic and may have caused simple partial seizure presenting with left arm jerking.    EEG was done:  consistent with mild generalized cerebral dysfunction. Epileptiform features were not seen during this recording.  MRA Head and neck was limited due to motion artifact but overall did not show significant stenosis.   Suspect his strokes were likely cardioembolic given heart failure and low EF.  Recommendation  Start Keppra 750 mG BID Start ASA 81mg  daily  Continue home Lipitor ( LDL is 45) PT/OT assessment Diabetic control ( last AIC was 10.1 in march 2018) Cardiology consult for 30 day monitor to look for paroxysmal Atrial fibrillation   Outpatient neurology follow up. Thanks for consult.

## 2016-10-20 NOTE — Progress Notes (Signed)
SLP Cancellation Note  Patient Details Name: [name] MRN: [id number] DOB: 06/06/1955   Cancelled treatment:       Reason Eval/Treat Not Completed: SLP screened, no needs identified, will sign off; MRI head indicated no acute processes; old CVA's indicated   Elvina Sidle, M.S., CCC-SLP 10/20/2016, 10:29 AM

## 2016-10-20 NOTE — Evaluation (Signed)
Physical Therapy Evaluation Patient Details Name: [name] MRN: [id number] DOB: November 21, 1955 Today's Date: 10/20/2016   History of Present Illness  Patient is a 61 y/o male who presents with was recently d/ced 8/16 presents today with left sided weakness and slurred speech. 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 MRI-Remote bilateral frontal and left parietal small to moderate cortically based infarcts. PMH includes poorly controlled DM, chronic systolic heart failure with EF 20-25%, PVD, BKA with poor healing stump.   Clinical Impression  Patient presents with slurred speech, mild left sided weakness which pt reports is close to baseline and impaired mobility s/p above. Tolerated bed mobility and pivoting into chair with min A due to having to clear arm rest. Pt Mod I with transfers PTA and uses w/c for all mobility. Pt's w/c does not fit through doorway so pt crawls to toilet. Requires total A for all IADLs and sisters help as they can. Per last admission, SNF is not an option so would recommend maximizing HH services. Will follow acutely to maximize independence and mobility prior to return home.    Follow Up Recommendations Home health PT;Supervision for mobility/OOB (with continued [facility] services)    Equipment Recommendations  None recommended by PT    Recommendations for Other Services       Precautions / Restrictions Precautions Precautions: Fall Precaution Comments: wound left knee Restrictions Other Position/Activity Restrictions: Right BKA, bilat knees and L foot wounds      Mobility  Bed Mobility Overal bed mobility: Needs Assistance Bed Mobility: Supine to Sit     Supine to sit: Supervision;HOB elevated     General bed mobility comments: Use of rails to get to EOB, no assist needed.   Transfers Overall transfer level: Needs assistance Equipment used: None Transfers: Lateral/Scoot Transfers          Lateral/Scoot Transfers: Min  assist General transfer comment: Required Min A to pivot into chair due to having to clear arm rest.  Ambulation/Gait             General Gait Details: unable  Stairs            Wheelchair Mobility    Modified Rankin (Stroke Patients Only) Modified Rankin (Stroke Patients Only) Pre-Morbid Rankin Score: Moderately severe disability Modified Rankin: Moderately severe disability     Balance Overall balance assessment: Needs assistance Sitting-balance support: No upper extremity supported (foot supported) Sitting balance-Leahy Scale: Fair Sitting balance - Comments: Some posterior lean noted during MMT and changing gown bt no overt LOB. Postural control: Posterior lean                                   Pertinent Vitals/Pain Pain Assessment: No/denies pain    Home Living Family/patient expects to be discharged to:: Private residence Living Arrangements: (P) Other relatives Available Help at Discharge: Family;Available PRN/intermittently Type of Home: Independent living facility Home Access: Ramped entrance     Home Layout: One level Home Equipment: [place] - 2 wheels;Bedside commode;Wheelchair - manual Additional Comments: pt states he washes at sink.  bathroom is not fully accessible to w/c, and he reports he lowers himself to the floor, and crawls to the toilet, then climbs up on it.     Prior Function Level of Independence: Needs assistance   Gait / Transfers Assistance Needed: Primarily uses manual w/c for mobility; w/c does not fit into bathroom, so pt crawls on  knees contributing to bilat knee wounds  ADL's / Homemaking Assistance Needed: aide assists with cooking/household tasks 1x/wk; sister helps intermittently other days  Comments: pt with hx of pressure ulcers to anterior knees documentated admission of 10/2015, pt reports using w/c for mobility, has not ambulated in about a month     Hand Dominance   Dominant Hand: Right     Extremity/Trunk Assessment   Upper Extremity Assessment Upper Extremity Assessment: Defer to OT evaluation    Lower Extremity Assessment Lower Extremity Assessment: RLE deficits/detail RLE Deficits / Details: R BKA, residual limb and knee wrapped in dressing RLE Sensation: history of peripheral neuropathy LLE Deficits / Details: generalized weakness and in dressing for multiple wounds; able to perform SLR.    Cervical / Trunk Assessment Cervical / Trunk Assessment: Normal  Communication   Communication: Expressive difficulties (slurred speech)  Cognition Arousal/Alertness: Awake/alert Behavior During Therapy: WFL for tasks assessed/performed Overall Cognitive Status: Within Functional Limits for tasks assessed                                        General Comments General comments (skin integrity, edema, etc.): left eye legally blind; deficits right eye, Only sees shapes, not details or colors.    Exercises     Assessment/Plan    PT Assessment Patient needs continued PT services  PT Problem List Decreased strength;Decreased mobility;Decreased knowledge of use of DME;Impaired sensation;Decreased activity tolerance;Decreased balance       PT Treatment Interventions DME instruction;Therapeutic activities;Therapeutic exercise;Functional mobility training;Patient/family education;Wheelchair mobility training;Balance training;Neuromuscular re-education    PT Goals (Current goals can be found in the Care Plan section)  Acute Rehab PT Goals Patient Stated Goal: Return home PT Goal Formulation: With patient Time For Goal Achievement: 11/03/16 Potential to Achieve Goals: Good    Frequency Min 3X/week   Barriers to discharge Inaccessible home environment;Decreased caregiver support      Co-evaluation               AM-PAC PT "6 Clicks" Daily Activity  Outcome Measure Difficulty turning over in bed (including adjusting bedclothes, sheets and  blankets)?: None Difficulty moving from lying on back to sitting on the side of the bed? : None Difficulty sitting down on and standing up from a chair with arms (e.g., wheelchair, bedside commode, etc,.)?: Unable Help needed moving to and from a bed to chair (including a wheelchair)?: A Little Help needed walking in hospital room?: Total Help needed climbing 3-5 steps with a railing? : Total 6 Click Score: 14    End of Session Equipment Utilized During Treatment: Gait belt Activity Tolerance: Patient tolerated treatment well Patient left: in chair;with call bell/phone within reach;with chair alarm set Nurse Communication: Mobility status PT Visit Diagnosis: Muscle weakness (generalized) (M62.81);Other abnormalities of gait and mobility (R26.89)    Time: 2595-6387 PT Time Calculation (min) (ACUTE ONLY): 24 min   Charges:   PT Evaluation $PT Eval Moderate Complexity: 1 Mod PT Treatments $Therapeutic Activity: 8-22 mins   PT G Codes:        Wray Kearns, PT, DPT [phone]    [name] 10/20/2016, 9:15 AM

## 2016-10-20 NOTE — Evaluation (Signed)
Occupational Therapy Evaluation Patient Details Name: [name] MRN: [id number] DOB: 1955/08/26 Today's Date: 10/20/2016    History of Present Illness Patient is a 61 y/o male who presents with was recently d/ced 8/16 presents today with left sided weakness and slurred speech. 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 MRI-Remote bilateral frontal and left parietal small to moderate cortically based infarcts. PMH includes poorly controlled DM, chronic systolic heart failure with EF 20-25%, PVD, BKA with poor healing stump.    Clinical Impression   Pt reports he was managing BADL independently PTA; aide and family assisting with IADL. Pt seen at bed level due to just returned to bed with RN staff. Pt reports he typically crawls into bathroom at home, educated pt on utilizing [facility] for safety and protecting wound on RLE. Recommending HHOT for follow up to maximize independence and safety with ADL and functional mobility. Pt would benefit from continued skilled OT to address established goals.    Follow Up Recommendations  Home health OT;Supervision/Assistance - 24 hour    Equipment Recommendations  None recommended by OT    Recommendations for Other Services       Precautions / Restrictions Precautions Precautions: Fall Precaution Comments: wound left knee Restrictions Other Position/Activity Restrictions: Right BKA, bilat knees and L foot wounds      Mobility Bed Mobility                  Transfers                      Balance                                           ADL either performed or assessed with clinical judgement   ADL Overall ADL's : Needs assistance/impaired                                       General ADL Comments: Pt just returned to bed with RN staff and use of [place]. Educated pt on use of BSC instead of crawling to toilet due to open wounds and safety concerns.      Vision  Baseline Vision/History:  (L eye blind, decreased vision in R eye) Patient Visual Report: No change from baseline       Perception     Praxis      Pertinent Vitals/Pain Pain Assessment: No/denies pain     Hand Dominance Right   Extremity/Trunk Assessment Upper Extremity Assessment Upper Extremity Assessment: Overall WFL for tasks assessed   Lower Extremity Assessment Lower Extremity Assessment: Defer to PT evaluation   Cervical / Trunk Assessment Cervical / Trunk Assessment: Normal   Communication Communication Communication: Expressive difficulties (slurred speech)   Cognition Arousal/Alertness: Awake/alert Behavior During Therapy: WFL for tasks assessed/performed Overall Cognitive Status: Within Functional Limits for tasks assessed                                     General Comments       Exercises     Shoulder Instructions      Home Living Family/patient expects to be discharged to:: Private residence Living Arrangements: Alone Available Help at Discharge: Family;Available PRN/intermittently Type  of Home: Independent living facility Home Access: [place]: One level         Bathroom Toilet: [place]: Environmental consultant - 2 wheels;Bedside commode;Wheelchair - manual   Additional Comments: pt states he washes at sink.  bathroom is not fully accessible to w/c, and he reports he lowers himself to the floor, and crawls to the toilet, then climbs up on it.       Prior Functioning/Environment Level of Independence: Needs assistance  Gait / Transfers Assistance Needed: Primarily uses manual w/c for mobility; w/c does not fit into bathroom, so pt crawls on knees contributing to bilat knee wounds ADL's / Homemaking Assistance Needed: aide assists with cooking/household tasks 3x/wk; sister helps intermittently other days   Comments: pt with hx of pressure ulcers to anterior knees documentated admission of 10/2015,  pt reports using w/c for mobility, has not ambulated in about a month        OT Problem List: Decreased strength;Decreased activity tolerance;Impaired balance (sitting and/or standing);Decreased safety awareness;Decreased knowledge of use of DME or AE      OT Treatment/Interventions: Self-care/ADL training;Therapeutic exercise;DME and/or AE instruction;Therapeutic activities;Patient/family education;Balance training    OT Goals(Current goals can be found in the care plan section) Acute Rehab OT Goals Patient Stated Goal: Return home OT Goal Formulation: With patient Time For Goal Achievement: 11/03/16 Potential to Achieve Goals: Good ADL Goals Pt Will Perform Lower Body Bathing: with supervision;sitting/lateral leans Pt Will Perform Lower Body Dressing: with supervision;sitting/lateral leans Pt Will Transfer to Toilet: with supervision;stand pivot transfer;bedside commode Pt Will Perform Toileting - Clothing Manipulation and hygiene: with supervision;sitting/lateral leans  OT Frequency: Min 2X/week   Barriers to D/C: Decreased caregiver support;Inaccessible home environment          Co-evaluation              AM-PAC PT "6 Clicks" Daily Activity     Outcome Measure Help from another person eating meals?: None Help from another person taking care of personal grooming?: A Little Help from another person toileting, which includes using toliet, bedpan, or urinal?: A Little Help from another person bathing (including washing, rinsing, drying)?: A Lot Help from another person to put on and taking off regular upper body clothing?: A Little Help from another person to put on and taking off regular lower body clothing?: A Lot 6 Click Score: 17   End of Session    Activity Tolerance: Patient tolerated treatment well Patient left: in bed;with call bell/phone within reach;with bed alarm set  OT Visit Diagnosis: Other abnormalities of gait and mobility (R26.89)                 Time: 1440-1456 OT Time Calculation (min): 16 min Charges:  OT General Charges $OT Visit: 1 Procedure OT Evaluation $OT Eval Moderate Complexity: 1 Procedure G-Codes:     Bailey A. Ulice Brilliant, M.S., OTR/L Pager: [place] 10/20/2016, 3:01 PM

## 2016-10-20 NOTE — Progress Notes (Signed)
Patient cleared for discharge this evening by MD; sister has arrived to transport him home; patient is missing his pants, wallet and keys per patient; no belongings found in the room but a shirt and one shoe in a bag; security was called to place a report of the missing items; ER, Radiology areas were checked; patient arrived to hospital via EMS. Discharge instructions given and reviewed; CM to follow up in the am to place his [place] orders; patient discharged with paper scrubs for an outfit via wheelchair accompanied by his sister. Rx's sent electronically to his pharmacy for pick up; this was explained to his sister.

## 2016-10-20 NOTE — Progress Notes (Signed)
VASCULAR LAB    Patient had MRA of neck. Please advise if carotid duplex still needed.  Thanks,  KANADY, CANDACE, RVT 10/20/2016, 7:09 AM

## 2016-10-20 NOTE — Discharge Summary (Signed)
Physician Discharge Summary  [name] EPP:[id number] DOB: 15-Feb-1956 DOA: 10/19/2016  PCP: Steve Rattler, DO  Admit date: 10/19/2016 Discharge date: 10/20/2016  Admitted From: Home  Disposition:  Home with [facility]  Recommendations for Outpatient Follow-up:  1. Follow up with PCP in 1-2 weeks 2. Follow up with Neurology as an outpatient 3. Follow up with Cardiology as an outpatient 4. Follow up with Wound Care Dr. Sharol Given as an outpatient 5. Follow up with Electrophysiology Cardiology for Loop Recorder 6. Please obtain CMP/CBC, Mag, Phos in one week  Home Health: YES Equipment/Devices: None  Discharge Condition: Stable  CODE STATUS: FULL CODE Diet recommendation: Heart Healthy Carb Modified Diet  Brief/Interim Summary: [name] is a 61 y.o. male with medical history significant for diabetes mellitus 2 on insulin and metformin, hypertension, peripheral vascular disease and prior right BKA, nonischemic cardiopathy with underlying systolic heart failure, moderate to severe pulmonary hypertension with associated moderate to severe tricuspid regurgitation. Patient recently discharged on 8/16 after an admission for heart failure. EMT went to patient's home for routine initial post 24 hour follow-up visit and noticed that the patient was experiencing left arm shaking and left arm weakness. He also appeared to be dysarthric. Patient denies slurred speech or dysarthria. Patient reports shaking lasted several minutes. Has never had before. No numbness or pain in upper extremity. Code stroke called upon arrival to the ER. Patient underwent CT of the head which revealed cerebral cortical infarcts bilaterally which were chronic. There was also a small infarct left cerebellum probably chronic but no signs of hemorrhage. Neurology was consulted. Evaluation regarding new seizures and possible evolving stroke in process. Neurology evaluated and felt as if patient did not have an acute  stroke. MRI showed remote bilateral frontal and left parietal small to moderate cortically based infarcts. Some of these were previously hemorrhagic. Neurology recommended placing the patient on Keppra and full dose asprin and recommended Loop recorder implantation but stated could be done as an outpatient. Neurology cleared the patient to be D/C'd Home. PT evaluated and recommended Home Health. Patient was deemed medically stable as symptoms resolved and will be D/C'd Home with Home Health where he will need to follow up with PCP, Cardiology (Electrophysiology), Neurology, and Wound Care Dr. Sharol Given as an outpatient.   Discharge Diagnoses:  Principal Problem:   Embolic stroke [facility]) Active Problems:   Cardiomyopathy, dilated (HCC)   Acute on chronic systolic heart failure, NYHA class 3 (HCC)   Moderate to severe pulmonary hypertension (HCC)   Severe tricuspid regurgitation by prior echocardiogram   Diabetes mellitus type 2 in obese (HCC)   HLD (hyperlipidemia)   Seizure (HCC)  Seizure  -Focal left upper extremity tremors consistent with likely seizure activity likely precipitated by areas of old stroke -EEG as recommended by neurology; EEG showed The EEG is abnormal and findings are consistent with mild generalized cerebral dysfunction. Epileptiform features were not seen during this recording. -Seizure precautions -Neurology Recommended starting Keppra 750 mg BID -Follow up with Neurology as an outpatient   Concern for Embolic stroke  -Patient presented with dysarthria and focal left arm weakness with associated tremors concerning for possible new onset seizure activity as well as possible new stroke. -CT head without contrast demonstrates cerebral cortical infarcts bilaterally with a small infarct in the left cerebellum probably chronic -Neuro following and does not feel strongly had an acute stroke and attributing symptoms to Seizure -MRA head and neck, MRI brain reveal no acute  abnormalities -  Carotid duplex recommened to be cancelled by Neurology  -Neurology recommending Loop Recorder to be done as an outpatient  -Frequent neurology checks -Hemoglobin A1c completed recent admission-obtain lipid panel and continue on statin -PT/OT/SLP evaluation recommend Home PT -Aspirin  Cardiomyopathy, dilated/Chronic systolic heart failure, NYHA class 3/Moderate to severe pulmonary hypertension/Severe tricuspid regurgitation by prior echocardiogram -Just discharged on 8/16 after admission for heart exacerbation secondary to noncompliance with medications -Evaluated by heart failure team who documented patient was not a candidate for advanced therapies with his comorbidities -Continue preadmission Lasix, metolazone and Entresto -Continue beta blocker -Daily weights; strict I/O -Recent ECHO just Done -Follow up with PCP and Cardiology as an outpatient -Neurology recommending Loop recorder to be placed as an outpatient   Diabetes mellitus type 2 in obese  -Continue Home Medication -SSI -HgbA1c was 7.2 on 10/14/16  HLD (hyperlipidemia) -Continue statin  Leg Wounds -Follow up with Wound Clinic and Dr. Sharol Given as an outpatient   Discharge Instructions  Discharge Instructions    (HEART FAILURE PATIENTS) Call MD:  Anytime you have any of the following symptoms: 1) 3 pound weight gain in 24 hours or 5 pounds in 1 week 2) shortness of breath, with or without a dry hacking cough 3) swelling in the hands, feet or stomach 4) if you have to sleep on extra pillows at night in order to breathe.    Complete by:  As directed    Ambulatory referral to Cardiac Electrophysiology    Complete by:  As directed    Needs Loop Recorder per Neurology Recommendation   Ambulatory referral to Neurology    Complete by:  As directed    An appointment is requested in approximately: 1 week for New Seizure and Hx of CVA   Call MD for:  difficulty breathing, headache or visual disturbances    Complete  by:  As directed    Call MD for:  extreme fatigue    Complete by:  As directed    Call MD for:  hives    Complete by:  As directed    Call MD for:  persistant dizziness or light-headedness    Complete by:  As directed    Call MD for:  persistant nausea and vomiting    Complete by:  As directed    Call MD for:  severe uncontrolled pain    Complete by:  As directed    Call MD for:  temperature >100.4    Complete by:  As directed    Diet - low sodium heart healthy    Complete by:  As directed    Increase activity slowly    Complete by:  As directed      Allergies as of 10/20/2016   No Known Allergies     Medication List    TAKE these medications   aspirin 325 MG tablet Take 1 tablet (325 mg total) by mouth daily.   atorvastatin 40 MG tablet Commonly known as:  LIPITOR Take 1 tablet (40 mg total) by mouth daily.   bacitracin ointment Apply topically 2 (two) times daily.   bisoprolol 5 MG tablet Commonly known as:  ZEBETA Take 1 tablet (5 mg total) by mouth daily.   brimonidine 0.2 % ophthalmic solution Commonly known as:  ALPHAGAN PLACE 1 DROP INTO THE LEFT EYE 2 (TWO) TIMES DAILY. What changed:  how much to take  how to take this  when to take this  additional instructions   dorzolamide-timolol 22.3-6.8 MG/ML ophthalmic solution Commonly  known as:  COSOPT Place 1 drop into the left eye 2 (two) times daily. What changed:  when to take this   furosemide 80 MG tablet Commonly known as:  LASIX Take 80 mg (1 tabs) in am and 40 mg (1/2 tab) in pm What changed:  how much to take  how to take this  when to take this  additional instructions   insulin glargine 100 UNIT/ML injection Commonly known as:  LANTUS Inject 10 Units into the skin at bedtime.   levETIRAcetam 750 MG tablet Commonly known as:  KEPPRA Take 1 tablet (750 mg total) by mouth 2 (two) times daily.   metFORMIN 500 MG tablet Commonly known as:  GLUCOPHAGE Take 1 tablet (500 mg total)  by mouth 2 (two) times daily with a meal.   metolazone 2.5 MG tablet Commonly known as:  ZAROXOLYN Take 1 tablet (2.5 mg total) by mouth daily.   potassium chloride SA 20 MEQ tablet Commonly known as:  K-DUR,KLOR-CON Take 40 mEq by mouth daily.   sacubitril-valsartan 49-51 MG Commonly known as:  ENTRESTO Take 1 tablet by mouth 2 (two) times daily.   tamsulosin 0.4 MG Caps capsule Commonly known as:  FLOMAX Take 1 capsule (0.4 mg total) by mouth daily after supper.   vitamin A 10000 UNIT capsule Take 10,000 Units by mouth daily.       No Known Allergies  Consultations:  Neurology  Procedures/Studies: Dg Chest 1 View  Result Date: 09/24/2016 CLINICAL DATA:  No chest c/o.  Smoker. Htn. Fluid overload EXAM: CHEST 1 VIEW COMPARISON:  10/07/2015 FINDINGS: The heart is enlarged and accentuated by the AP portable position of the patient. The lungs are free of focal consolidations and pleural effusions. No pulmonary edema. IMPRESSION: Stable cardiomegaly. Electronically Signed   By: Nolon Nations M.D.   On: 09/24/2016 14:22   Dg Chest 2 View  Result Date: 10/15/2016 CLINICAL DATA:  CHF EXAM: CHEST  2 VIEW COMPARISON:  Chest radiograph from one day prior. FINDINGS: Stable cardiomediastinal silhouette with mild enlargement of the cardiopericardial silhouette. No pneumothorax. Trace bilateral pleural effusions. Cephalization of the pulmonary vasculature without overt pulmonary edema. IMPRESSION: Stable enlargement of the cardiopericardial silhouette without overt pulmonary edema. Trace bilateral pleural effusions. Electronically Signed   By: Ilona Sorrel M.D.   On: 10/15/2016 07:40   Mr Jodene Nam Head Wo Contrast  Result Date: 10/19/2016 CLINICAL DATA:  Seizure, new, with shaking and left-sided weakness. Evaluate for stroke. EXAM: MRI HEAD WITHOUT CONTRAST MRA HEAD WITHOUT CONTRAST MRA NECK WITHOUT CONTRAST TECHNIQUE: Multiplanar, multiecho pulse sequences of the brain and surrounding  structures were obtained without intravenous contrast. Angiographic images of the Circle of Willis were obtained using MRA technique without intravenous contrast. Angiographic images of the neck were obtained using MRA technique without intravenous contrast. Carotid stenosis measurements (when applicable) are obtained utilizing NASCET criteria, using the distal internal carotid diameter as the denominator. COMPARISON:  Head CT from earlier today FINDINGS: MRI HEAD FINDINGS Brain: No acute infarction, hemorrhage, hydrocephalus, extra-axial collection or mass lesion. Small anterior and moderate posterior high right frontal infarcts. Small remote cortically based infarcts in the anterior left frontal and left parietal lobes. Most of these infarcts show hemosiderin staining from old hemorrhage. Hazy T2 hyperintensity in the pons which may be chronic microvascular ischemic change, also seen around the lateral ventricles. No peripheral cerebellar or deep gray nucleus signal changes. Remote small infarct in the inferior left cerebellum. Vascular: Arterial findings below. Normal dural venous sinus flow  voids. Skull and upper cervical spine: No evidence of marrow lesion Sinuses/Orbits: Symmetric prominent size of superior ophthalmic veins which may be transient. No abnormal or asymmetric findings along the cavernous sinus. MRA HEAD FINDINGS Diffuse motion limitation, at some levels moderately so. The carotid, dominant right vertebral, basilar, and proximal ACA/ MCA/ PCA vessels are widely patent. No noted branch occlusion, beading, or aneurysm. 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 MRA NECK FINDINGS Motion degraded. Coverage begins at the upper common carotid artery. There is antegrade flow in both internal carotid arteries without convincing stenosisotid. Hypoplastic left vertebral artery which is not seen by the V3 segment. The visible right vertebral artery is patent. IMPRESSION: 1. Motion  degraded brain MRI without acute finding. 2. Remote bilateral frontal and left parietal small to moderate cortically based infarcts. Some of these were previously hemorrhagic. No other finding to explain seizure history. 3. Hazy T2 signal in the pons, favor chronic microvascular ischemic injury. 4. Motion degraded neck and intracranial MRA. Accounting for hypoplastic left vertebral artery, no emergent large vessel occlusion or other acute finding. Electronically Signed   By: Monte Fantasia M.D.   On: 10/19/2016 15:06   Mr Jodene Nam Neck Wo Contrast  Result Date: 10/19/2016 CLINICAL DATA:  Seizure, new, with shaking and left-sided weakness. Evaluate for stroke. EXAM: MRI HEAD WITHOUT CONTRAST MRA HEAD WITHOUT CONTRAST MRA NECK WITHOUT CONTRAST TECHNIQUE: Multiplanar, multiecho pulse sequences of the brain and surrounding structures were obtained without intravenous contrast. Angiographic images of the Circle of Willis were obtained using MRA technique without intravenous contrast. Angiographic images of the neck were obtained using MRA technique without intravenous contrast. Carotid stenosis measurements (when applicable) are obtained utilizing NASCET criteria, using the distal internal carotid diameter as the denominator. COMPARISON:  Head CT from earlier today FINDINGS: MRI HEAD FINDINGS Brain: No acute infarction, hemorrhage, hydrocephalus, extra-axial collection or mass lesion. Small anterior and moderate posterior high right frontal infarcts. Small remote cortically based infarcts in the anterior left frontal and left parietal lobes. Most of these infarcts show hemosiderin staining from old hemorrhage. Hazy T2 hyperintensity in the pons which may be chronic microvascular ischemic change, also seen around the lateral ventricles. No peripheral cerebellar or deep gray nucleus signal changes. Remote small infarct in the inferior left cerebellum. Vascular: Arterial findings below. Normal dural venous sinus flow voids.  Skull and upper cervical spine: No evidence of marrow lesion Sinuses/Orbits: Symmetric prominent size of superior ophthalmic veins which may be transient. No abnormal or asymmetric findings along the cavernous sinus. MRA HEAD FINDINGS Diffuse motion limitation, at some levels moderately so. The carotid, dominant right vertebral, basilar, and proximal ACA/ MCA/ PCA vessels are widely patent. No noted branch occlusion, beading, or aneurysm. 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 MRA NECK FINDINGS Motion degraded. Coverage begins at the upper common carotid artery. There is antegrade flow in both internal carotid arteries without convincing stenosisotid. Hypoplastic left vertebral artery which is not seen by the V3 segment. The visible right vertebral artery is patent. IMPRESSION: 1. Motion degraded brain MRI without acute finding. 2. Remote bilateral frontal and left parietal small to moderate cortically based infarcts. Some of these were previously hemorrhagic. No other finding to explain seizure history. 3. Hazy T2 signal in the pons, favor chronic microvascular ischemic injury. 4. Motion degraded neck and intracranial MRA. Accounting for hypoplastic left vertebral artery, no emergent large vessel occlusion or other acute finding. Electronically Signed   By: Angelica Chessman  Watts M.D.   On: 10/19/2016 15:06   Mr Brain Wo Contrast  Result Date: 10/19/2016 CLINICAL DATA:  Seizure, new, with shaking and left-sided weakness. Evaluate for stroke. EXAM: MRI HEAD WITHOUT CONTRAST MRA HEAD WITHOUT CONTRAST MRA NECK WITHOUT CONTRAST TECHNIQUE: Multiplanar, multiecho pulse sequences of the brain and surrounding structures were obtained without intravenous contrast. Angiographic images of the Circle of Willis were obtained using MRA technique without intravenous contrast. Angiographic images of the neck were obtained using MRA technique without intravenous contrast. Carotid stenosis measurements  (when applicable) are obtained utilizing NASCET criteria, using the distal internal carotid diameter as the denominator. COMPARISON:  Head CT from earlier today FINDINGS: MRI HEAD FINDINGS Brain: No acute infarction, hemorrhage, hydrocephalus, extra-axial collection or mass lesion. Small anterior and moderate posterior high right frontal infarcts. Small remote cortically based infarcts in the anterior left frontal and left parietal lobes. Most of these infarcts show hemosiderin staining from old hemorrhage. Hazy T2 hyperintensity in the pons which may be chronic microvascular ischemic change, also seen around the lateral ventricles. No peripheral cerebellar or deep gray nucleus signal changes. Remote small infarct in the inferior left cerebellum. Vascular: Arterial findings below. Normal dural venous sinus flow voids. Skull and upper cervical spine: No evidence of marrow lesion Sinuses/Orbits: Symmetric prominent size of superior ophthalmic veins which may be transient. No abnormal or asymmetric findings along the cavernous sinus. MRA HEAD FINDINGS Diffuse motion limitation, at some levels moderately so. The carotid, dominant right vertebral, basilar, and proximal ACA/ MCA/ PCA vessels are widely patent. No noted branch occlusion, beading, or aneurysm. 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 MRA NECK FINDINGS Motion degraded. Coverage begins at the upper common carotid artery. There is antegrade flow in both internal carotid arteries without convincing stenosisotid. Hypoplastic left vertebral artery which is not seen by the V3 segment. The visible right vertebral artery is patent. IMPRESSION: 1. Motion degraded brain MRI without acute finding. 2. Remote bilateral frontal and left parietal small to moderate cortically based infarcts. Some of these were previously hemorrhagic. No other finding to explain seizure history. 3. Hazy T2 signal in the pons, favor chronic microvascular ischemic  injury. 4. Motion degraded neck and intracranial MRA. Accounting for hypoplastic left vertebral artery, no emergent large vessel occlusion or other acute finding. Electronically Signed   By: Monte Fantasia M.D.   On: 10/19/2016 15:06   US Scrotum  Result Date: 09/24/2016 CLINICAL DATA:  Swelling and pain EXAM: ULTRASOUND OF SCROTUM TECHNIQUE: Complete ultrasound examination of the testicles, epididymis, and other scrotal structures was performed. COMPARISON:  07/08/2015 FINDINGS: Right testicle Measurements: 4.3 x 2.7 x 2.4 cm. No mass or microlithiasis visualized. Left testicle Measurements: 3.2 x 3.5 x 2.9 cm. No mass or microlithiasis visualized. Right epididymis:  Normal in size and appearance. Left epididymis:  Normal in size and appearance. Hydrocele:  Moderate bilateral hydroceles. Varicocele: None visualized. Marked scrotal edema and skin thickening, skin thickness on the right measures 1.7 cm and on the left 4.3 cm. No focal fluid collections within the edematous soft tissues of the scrotal sac. IMPRESSION: 1. Normal appearance of the testes 2. Large amount of scrotal edema, left greater than right with skin thickening. 3. Moderate bilateral hydroceles Electronically Signed   By: Donavan Foil M.D.   On: 09/24/2016 19:04   Dg Chest Portable 1 View  Result Date: 10/14/2016 CLINICAL DATA:  61 y/o  M; shortness of breath. EXAM: PORTABLE CHEST 1 VIEW COMPARISON:  09/25/2016 chest  radiograph FINDINGS: Stable cardiomegaly given projection and technique. Clear lungs. No pleural effusion or pneumothorax. No acute osseous abnormality is evident. IMPRESSION: Stable cardiomegaly.  No acute pulmonary process identified. Electronically Signed   By: Kristine Garbe M.D.   On: 10/14/2016 06:19   Dg Chest Port 1 View  Result Date: 09/25/2016 CLINICAL DATA:  Follow-up CHF. EXAM: PORTABLE CHEST 1 VIEW COMPARISON:  Chest x-ray of September 24, 2016 FINDINGS: The lungs are adequately inflated. There is no  focal infiltrate. There is no pleural effusion. The cardiac silhouette remains mildly enlarged. The pulmonary vascularity is not engorged. There is mild tortuosity of the ascending and descending thoracic aorta. The trachea is midline. The bony thorax exhibits no acute abnormality. IMPRESSION: Stable cardiomegaly.  No acute cardiopulmonary abnormality. Electronically Signed   By: David  Martinique M.D.   On: 09/25/2016 07:09   Ct Head Code Stroke Wo Contrast  Addendum Date: 10/19/2016   ADDENDUM REPORT: 10/19/2016 11:02 ADDENDUM: These results were called by telephone at the time of interpretation on 10/19/2016 at 11:02 am to Dr. Lorraine Lax, who verbally acknowledged these results. Electronically Signed   By: Franchot Gallo M.D.   On: 10/19/2016 11:02   Result Date: 10/19/2016 CLINICAL DATA:  Code stroke. Focal neuro deficit less than 6 hours, stroke suspected. Slurred speech, left arm twitching EXAM: CT HEAD WITHOUT CONTRAST TECHNIQUE: Contiguous axial images were obtained from the base of the skull through the vertex without intravenous contrast. COMPARISON:  None. FINDINGS: Brain: Chronic infarcts in the left frontal and left parietal cortex. Chronic infarct in the right frontal parietal lobe over the convexity. Small chronic infarct right occipital lobe. Small hypodensity left cerebellum consistent with infarct of indeterminate age, favor chronic. Negative for hemorrhage or mass.  No definite acute infarct. Mild atrophy.  Negative for hydrocephalus Vascular: Negative for hyperdense vessel Skull: Negative Sinuses/Orbits: Negative Other: None ASPECTS ([place] Stroke Program Early CT Score) - Ganglionic level infarction (caudate, lentiform nuclei, internal capsule, insula, M1-M3 cortex): 7 - Supraganglionic infarction (M4-M6 cortex): 3 Total score (0-10 with 10 being normal): 10 IMPRESSION: 1. Cerebral cortical infarcts bilaterally. Small infarct left cerebellum probably chronic. Negative for hemorrhage 2. ASPECTS is  10 Electronically Signed: By: Franchot Gallo M.D. On: 10/19/2016 10:49    EEG Description of the recording The posterior dominant rhythm is 7 Hz symmetrical reactive  Sleep was not obtained. No localizing, lateralizing, interictal epileptiform features were seen during this recording.  Impression The EEG is abnormal and findings are consistent with mild generalized cerebral dysfunction. Epileptiform features were not seen during this recording.  Subjective: Seen and examined and had improved. No reoccurance of symptoms. Ok to D/C Home from Neuro standpoint and patient wanting to go home.   Discharge Exam: Vitals:   10/20/16 0949 10/20/16 1516  BP: (!) 141/100 (!) 131/100  Pulse: 61 69  Resp: 20 20  Temp: (!) 97.4 F (36.3 C) 97.6 F (36.4 C)  SpO2: 100% 97%   Vitals:   10/20/16 0445 10/20/16 0645 10/20/16 0949 10/20/16 1516  BP: 130/90 135/88 (!) 141/100 (!) 131/100  Pulse: (!) 58 (!) 55 61 69  Resp: 17 18 20 20   Temp: 98.6 F (37 C) 98.8 F (37.1 C) (!) 97.4 F (36.3 C) 97.6 F (36.4 C)  TempSrc: Oral Oral Axillary Oral  SpO2: 98% 97% 100% 97%  Weight:      Height:       General: Pt is alert, awake, not in acute distress Cardiovascular: RRR, S1/S2 +, no  rubs, no gallops Respiratory: CTA bilaterally, no wheezing, no rhonchi Abdominal: Soft, NT, ND, bowel sounds + Extremities: Has right Leg BKA;  no edema, no cyanosis; Left Knee wound   The results of significant diagnostics from this hospitalization (including imaging, microbiology, ancillary and laboratory) are listed below for reference.    Microbiology: Recent Results (from the past 240 hour(s))  Urine culture     Status: Abnormal   Collection Time: 10/14/16 11:33 AM  Result Value Ref Range Status   Specimen Description URINE, CLEAN CATCH  Final   Special Requests NONE  Final   Culture MULTIPLE SPECIES PRESENT, SUGGEST RECOLLECTION (A)  Final   Report Status 10/15/2016 FINAL  Final  Culture, blood (Routine  X 2) w Reflex to ID Panel     Status: None   Collection Time: 10/14/16  3:35 PM  Result Value Ref Range Status   Specimen Description BLOOD RIGHT ARM  Final   Special Requests IN PEDIATRIC BOTTLE Blood Culture adequate volume  Final   Culture NO GROWTH 5 DAYS  Final   Report Status 10/19/2016 FINAL  Final  Culture, blood (Routine X 2) w Reflex to ID Panel     Status: None   Collection Time: 10/14/16  3:35 PM  Result Value Ref Range Status   Specimen Description BLOOD RIGHT WRIST  Final   Special Requests IN PEDIATRIC BOTTLE Blood Culture adequate volume  Final   Culture NO GROWTH 5 DAYS  Final   Report Status 10/19/2016 FINAL  Final    Labs: BNP (last 3 results)  Recent Labs  05/24/16 1034 09/24/16 1407 10/14/16 0556  BNP 2,709.6* 3,276.9* >9,179.1*   Basic Metabolic Panel:  Recent Labs Lab 10/16/16 0230 10/17/16 0343 10/18/16 0436 10/19/16 1043 10/19/16 1103 10/20/16 0951  NA 139 139 139 135 138 137  K 4.1 4.0 4.0 6.4* 4.2 3.4*  CL 103 99* 96* 94* 96* 93*  CO2 28 31 34*  --  30 34*  GLUCOSE 83 80 114* 104* 106* 80  BUN 31* 29* 29* 51* 33* 29*  CREATININE 1.29* 1.34* 1.28* 1.40* 1.34* 1.26*  CALCIUM 8.2* 8.4* 8.7*  --  8.5* 8.6*  MG 1.6* 2.0  --   --   --  1.9  PHOS  --   --   --   --   --  4.0   Liver Function Tests:  Recent Labs Lab 10/15/16 0336 10/19/16 1103 10/20/16 0951  AST 18 23 28   ALT 19 17 21   ALKPHOS 252* 272* 288*  BILITOT 1.3* 1.5* 1.5*  PROT 5.7* 6.4* 6.7  ALBUMIN 2.0* 2.3* 2.5*   No results for input(s): LIPASE, AMYLASE in the last 168 hours. No results for input(s): AMMONIA in the last 168 hours. CBC:  Recent Labs Lab 10/14/16 0556 10/15/16 0336 10/16/16 0230 10/19/16 1042 10/19/16 1043 10/20/16 0951  WBC 8.7 7.8 5.9 7.1  --  6.4  NEUTROABS 7.1  --   --  5.3  --  4.7  HGB 12.1* 11.4* 11.6* 12.5* 14.3 13.1  HCT 37.5* 35.1* 36.7* 37.6* 42.0 40.3  MCV 78.3 80.5 81.0 78.2  --  79.6  PLT 318 297 322 310  --  366   Cardiac  Enzymes:  Recent Labs Lab 10/14/16 0556 10/14/16 1522 10/14/16 2115 10/15/16 0336  TROPONINI 0.04* 0.05* 0.04* 0.04*   BNP: Invalid input(s): POCBNP CBG:  Recent Labs Lab 10/20/16 0608 10/20/16 0712 10/20/16 0810 10/20/16 1203 10/20/16 1615  GLUCAP 63* 48* 121* 86 100*  D-Dimer No results for input(s): DDIMER in the last 72 hours. Hgb A1c No results for input(s): HGBA1C in the last 72 hours. Lipid Profile  Recent Labs  10/20/16 0505  CHOL 98  HDL 46  LDLCALC 45  TRIG 34  CHOLHDL 2.1   Thyroid function studies No results for input(s): TSH, T4TOTAL, T3FREE, THYROIDAB in the last 72 hours.  Invalid input(s): FREET3 Anemia work up No results for input(s): VITAMINB12, FOLATE, FERRITIN, TIBC, IRON, RETICCTPCT in the last 72 hours. Urinalysis    Component Value Date/Time   COLORURINE YELLOW 10/19/2016 [place] 10/19/2016 1145   LABSPEC 1.012 10/19/2016 1145   PHURINE 6.0 10/19/2016 1145   GLUCOSEU NEGATIVE 10/19/2016 1145   HGBUR NEGATIVE 10/19/2016 1145   BILIRUBINUR NEGATIVE 10/19/2016 1145   KETONESUR NEGATIVE 10/19/2016 1145   PROTEINUR 30 (A) 10/19/2016 1145   UROBILINOGEN 0.2 07/17/2014 2259   NITRITE NEGATIVE 10/19/2016 1145   LEUKOCYTESUR NEGATIVE 10/19/2016 1145   Sepsis Labs Invalid input(s): PROCALCITONIN,  WBC,  LACTICIDVEN Microbiology Recent Results (from the past 240 hour(s))  Urine culture     Status: Abnormal   Collection Time: 10/14/16 11:33 AM  Result Value Ref Range Status   Specimen Description URINE, CLEAN CATCH  Final   Special Requests NONE  Final   Culture MULTIPLE SPECIES PRESENT, SUGGEST RECOLLECTION (A)  Final   Report Status 10/15/2016 FINAL  Final  Culture, blood (Routine X 2) w Reflex to ID Panel     Status: None   Collection Time: 10/14/16  3:35 PM  Result Value Ref Range Status   Specimen Description BLOOD RIGHT ARM  Final   Special Requests IN PEDIATRIC BOTTLE Blood Culture adequate volume  Final    Culture NO GROWTH 5 DAYS  Final   Report Status 10/19/2016 FINAL  Final  Culture, blood (Routine X 2) w Reflex to ID Panel     Status: None   Collection Time: 10/14/16  3:35 PM  Result Value Ref Range Status   Specimen Description BLOOD RIGHT WRIST  Final   Special Requests IN PEDIATRIC BOTTLE Blood Culture adequate volume  Final   Culture NO GROWTH 5 DAYS  Final   Report Status 10/19/2016 FINAL  Final   Time coordinating discharge: 35 minutes  SIGNED:  Kerney Elbe, DO [facility] 10/20/2016, 5:14 PM Pager [phone]  If 7PM-7AM, please contact night-coverage [email or web address] Password [email or web address]

## 2016-10-22 ENCOUNTER — Telehealth: Payer: Self-pay | Admitting: *Deleted

## 2016-10-23 ENCOUNTER — Telehealth (INDEPENDENT_AMBULATORY_CARE_PROVIDER_SITE_OTHER): Payer: Self-pay

## 2016-10-23 ENCOUNTER — Other Ambulatory Visit (HOSPITAL_COMMUNITY): Payer: Self-pay

## 2016-10-23 NOTE — Telephone Encounter (Signed)
I called and spoke with Caren Griffins from [facility]. She filled me in on patients home life which unfortunately is rough. He urinates in a bucket and wheels himself to the toilet to dispense. He has a Psychologist, sport and exercise from [name]? Come 2-3 xs a week to bathe him. He lives alone. He is blind in one eye and only has 20% vision in the other. Paramedics come to fill pill box. He was offered skilled nursing but declined he was told he would have to pay out of pocket the first 21 days. He cannot afford this. He cannot afford OT, PT visits either, since medicaid will not pay for this either. Caren Griffins will order the evaluation only for these two services and they will fight for the patient to try to get services approved. Since patient is such a high risk to return to the hospital without these services.  Gave wound care for profore dressing left leg, and silvadene to bilateral knee wounds.

## 2016-10-23 NOTE — Progress Notes (Signed)
Paramedicine Encounter    Patient ID: [name], male    DOB: 1955-04-02, 60 y.o.   MRN: [id number]    Patient Care Team: Steve Rattler, DO as PCP - General  Patient Active Problem List   Diagnosis Date Noted  . Embolic stroke ([place]) 81/03/7508  . Cardiomyopathy, dilated ([place]) 10/19/2016  . Acute on chronic systolic heart failure, NYHA class 3 ([place]) 10/19/2016  . Moderate to severe pulmonary hypertension ([place]) 10/19/2016  . Severe tricuspid regurgitation by prior echocardiogram 10/19/2016  . Diabetes mellitus type 2 in obese ([place]) 10/19/2016  . HLD (hyperlipidemia) 10/19/2016  . Seizure ([place]) 10/19/2016  . Fluid overload 10/14/2016  . 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 Cardiorenal syndrome with renal failure 09/28/2016  . Acute urinary retention 09/26/2016  . Acute on chronic combined systolic and diastolic CHF (congestive heart failure) ([place]) 09/26/2016  . Scrotal edema 09/24/2016  . Multiple open wounds of lower leg, initial encounter 09/24/2016  . Tinea of groin 09/24/2016  . Chronic kidney disease (CKD), stage III (moderate) 06/07/2016  . Chronic combined systolic and diastolic congestive heart failure ([place])   . Protein calorie malnutrition ([place]) 07/12/2015  . Congestive dilated cardiomyopathy ([place]) 07/11/2015  . Pressure ulcer 07/09/2015  . Open knee wound 07/06/2015  . History of right below knee amputation ([place]) 02/22/2015  . PAD (peripheral artery disease) ([place]) 02/22/2015  . Blindness of left eye 01/04/2015  . Complications, amputation stump late ([place]) 08/06/2014  . Diabetic neuropathy associated with type 2 diabetes mellitus ([place]) 12/01/2013  . Noncompliance with medications 07/13/2010  . OTHER SPEC TYPES SCHIZOPHRENIA UNSPEC CONDITION 05/24/2009  . Obesity, unspecified 06/09/2007  . DM (diabetes mellitus), type 2, uncontrolled ([place]) 05/02/2006  . Essential hypertension, benign  05/02/2006    Current Outpatient Prescriptions:  .  aspirin 325 MG tablet, Take 1 tablet (325 mg total) by mouth daily., Disp: 30 tablet, Rfl: 0 .  atorvastatin (LIPITOR) 40 MG tablet, Take 1 tablet (40 mg total) by mouth daily., Disp: 30 tablet, Rfl: 0 .  bisoprolol (ZEBETA) 5 MG tablet, Take 1 tablet (5 mg total) by mouth daily., Disp: 30 tablet, Rfl: 1 .  furosemide (LASIX) 80 MG tablet, Take 80 mg (1 tabs) in am and 40 mg (1/2 tab) in pm (Patient taking differently: Take 80 mg by mouth daily. ), Disp: 45 tablet, Rfl: 6 .  metFORMIN (GLUCOPHAGE) 500 MG tablet, Take 1 tablet (500 mg total) by mouth 2 (two) times daily with a meal., Disp: 60 tablet, Rfl: 6 .  potassium chloride SA (K-DUR,KLOR-CON) 20 MEQ tablet, Take 40 mEq by mouth daily. , Disp: , Rfl:  .  sacubitril-valsartan (ENTRESTO) 49-51 MG, Take 1 tablet by mouth 2 (two) times daily., Disp: 60 tablet, Rfl: 3 .  tamsulosin (FLOMAX) 0.4 MG CAPS capsule, Take 1 capsule (0.4 mg total) by mouth daily after supper., Disp: 30 capsule, Rfl: 0 .  bacitracin ointment, Apply topically 2 (two) times daily., Disp: 120 g, Rfl: 0 .  brimonidine (ALPHAGAN) 0.2 % ophthalmic solution, PLACE 1 DROP INTO THE LEFT EYE 2 (TWO) TIMES DAILY. (Patient taking differently: Place 1 drop into the left eye daily. ), Disp: 5 mL, Rfl: 0 .  dorzolamide-timolol (COSOPT) 22.3-6.8 MG/ML ophthalmic solution, Place 1 drop into the left eye 2 (two) times daily. (Patient taking differently: Place 1 drop into the left eye daily. ), Disp: 10 mL, Rfl: 12 .  insulin glargine (LANTUS) 100 UNIT/ML  injection, Inject 10 Units into the skin at bedtime. , Disp: , Rfl:  .  levETIRAcetam (KEPPRA) 750 MG tablet, Take 1 tablet (750 mg total) by mouth 2 (two) times daily., Disp: 60 tablet, Rfl: 0 .  metolazone (ZAROXOLYN) 2.5 MG tablet, Take 1 tablet (2.5 mg total) by mouth daily., Disp: 30 tablet, Rfl: 0 .  vitamin A 10000 UNIT capsule, Take 10,000 Units by mouth daily., Disp: , Rfl:  No  Known Allergies   Social History   Social History  . Marital status: Married    Spouse name: N/A  . Number of children: N/A  . Years of education: N/A   Occupational History  . Not on file.   Social History Main Topics  . Smoking status: Former Smoker    Years: 20.00    Quit date: 05/10/2000  . Smokeless tobacco: Former Systems developer    Quit date: 02/22/2000  . Alcohol use No  . Drug use: No  . Sexual activity: Not on file   Other Topics Concern  . Not on file   Social History Narrative   ** Merged History Encounter **       Lives alone.     Physical Exam  Pulmonary/Chest: No respiratory distress. He has no wheezes. He has no rales.  Musculoskeletal: He exhibits edema.  Skin: Skin is warm and dry. He is not diaphoretic.        Future Appointments Date Time Provider [place]  10/25/2016 3:35 PM Marjie Skiff, MD [facility]  10/30/2016 1:15 PM Newt Minion, MD [facility] None  11/22/2016 11:00 AM [facility] PA/NP [facility] None    ATF pt [name] x4 sitting in wheelchair talking with nurses from a healthcare center.  The nurses was talking to pt about going into a rehab facility for occupational and physical therapy.  Pt refused rehab.  He stated that he was diagnosed with seizures.  Pt has not taken but one dose of medications since hes been home.  Pt denies sob, dizziness, and headache.  rx bottles verified and pill box refilled.  Pt still cant weigh at home.  BP (!) 144/68 (BP Location: Right Arm, Patient Position: Sitting, Cuff Size: Normal)   Pulse 72   Resp 16  CBG-92   Demetria Copper, EMT Paramedic 10/23/2016   ACTION: Home visit completed

## 2016-10-23 NOTE — Telephone Encounter (Signed)
Caren Griffins with [facility] called stating that an order was written for patient to have PT, OT, and a [place] aid, but patient has medicaid and services will not be covered.  She would like to know what Dr. Sharol Given would like to do?  She would still like Wound care orders.  Stated that he has a stump wound, knee wound, and left leg wound. CB# is [phone].  Please advise.  Thank You.

## 2016-10-25 ENCOUNTER — Encounter: Payer: Self-pay | Admitting: Family Medicine

## 2016-10-25 ENCOUNTER — Ambulatory Visit (INDEPENDENT_AMBULATORY_CARE_PROVIDER_SITE_OTHER): Payer: Medicaid Other | Admitting: Family Medicine

## 2016-10-25 VITALS — BP 138/90 | HR 69 | Temp 98.3°F | Wt 210.0 lb

## 2016-10-25 DIAGNOSIS — Z09 Encounter for follow-up examination after completed treatment for conditions other than malignant neoplasm: Secondary | ICD-10-CM | POA: Diagnosis not present

## 2016-10-25 DIAGNOSIS — I5043 Acute on chronic combined systolic (congestive) and diastolic (congestive) heart failure: Secondary | ICD-10-CM

## 2016-10-25 DIAGNOSIS — I63133 Cerebral infarction due to embolism of bilateral carotid arteries: Secondary | ICD-10-CM | POA: Diagnosis not present

## 2016-10-25 NOTE — Patient Instructions (Signed)
It was great seeing you today! We have addressed the following issues today  1. I will refer you to Neurology and Electrophysiology for a loop recorder  2. You will follow up with your doctor in September and discuss the results from your tests.  If we did any lab work today, and the results require attention, either me or my nurse will get in touch with you. If everything is normal, you will get a letter in mail and a message via . If you don't hear from [place] in two weeks, please give [place] a call. Otherwise, we look forward to seeing you again at your next visit. If you have any questions or concerns before then, please call the clinic at [phone].  Please bring all your medications to every doctors visit  Sign up for My Chart to have easy access to your labs results, and communication with your Primary care physician. Please ask Front Desk for some assistance.   Please check-out at the front desk before leaving the clinic.    Take Care,   Dr. Andy Gauss

## 2016-10-26 LAB — CMP14+EGFR
ALT: 17 IU/L (ref 0–44)
AST: 20 IU/L (ref 0–40)
Albumin/Globulin Ratio: 0.7 — ABNORMAL LOW (ref 1.2–2.2)
Albumin: 2.9 g/dL — ABNORMAL LOW (ref 3.5–5.5)
Alkaline Phosphatase: 307 IU/L — ABNORMAL HIGH (ref 39–117)
BUN/Creatinine Ratio: 22 — ABNORMAL HIGH (ref 9–20)
BUN: 23 mg/dL (ref 6–24)
Bilirubin Total: 1.2 mg/dL (ref 0.0–1.2)
CO2: 32 mmol/L — ABNORMAL HIGH (ref 20–29)
Calcium: 9.1 mg/dL (ref 8.7–10.2)
Chloride: 98 mmol/L (ref 96–106)
Creatinine, Ser: 1.04 mg/dL (ref 0.76–1.27)
GFR calc Af Amer: 90 mL/min/{1.73_m2} (ref >59)
GFR calc non Af Amer: 78 mL/min/{1.73_m2} (ref >59)
Globulin, Total: 4 g/dL (ref 1.5–4.5)
Glucose: 87 mg/dL (ref 65–99)
Potassium: 4.3 mmol/L (ref 3.5–5.2)
Sodium: 142 mmol/L (ref 134–144)
Total Protein: 6.9 g/dL (ref 6.0–8.5)

## 2016-10-26 LAB — MAGNESIUM: Magnesium: 1.8 mg/dL (ref 1.6–2.3)

## 2016-10-26 LAB — CBC WITH DIFFERENTIAL/PLATELET
Basophils Absolute: 0 10*3/uL (ref 0.0–0.2)
Basos: 0 %
EOS (ABSOLUTE): 0.1 10*3/uL (ref 0.0–0.4)
Eos: 1 %
Hematocrit: 38.4 % (ref 37.5–51.0)
Hemoglobin: 12.5 g/dL — ABNORMAL LOW (ref 13.0–17.7)
Immature Grans (Abs): 0 10*3/uL (ref 0.0–0.1)
Immature Granulocytes: 0 %
Lymphocytes Absolute: 0.8 10*3/uL (ref 0.7–3.1)
Lymphs: 14 %
MCH: 25.4 pg — ABNORMAL LOW (ref 26.6–33.0)
MCHC: 32.6 g/dL (ref 31.5–35.7)
MCV: 78 fL — ABNORMAL LOW (ref 79–97)
Monocytes Absolute: 0.8 10*3/uL (ref 0.1–0.9)
Monocytes: 13 %
Neutrophils Absolute: 4.4 10*3/uL (ref 1.4–7.0)
Neutrophils: 72 %
Platelets: 361 10*3/uL (ref 150–379)
RBC: 4.93 x10E6/uL (ref 4.14–5.80)
RDW: 19.1 % — ABNORMAL HIGH (ref 12.3–15.4)
WBC: 6.1 10*3/uL (ref 3.4–10.8)

## 2016-10-26 LAB — PHOSPHORUS: Phosphorus: 3.5 mg/dL (ref 2.5–4.5)

## 2016-10-28 NOTE — Progress Notes (Signed)
Subjective:    Patient ID: [name], male    DOB: 05-Oct-1955, 61 y.o.   MRN: [id number]   CC: Hospital discharge follow up for stroke and seizures.  HPI: Patient is a 61 yo male with a past medical history significant for T2DM, HTN, PVD, CHF, pulmonary HTN who present today after recent  Hospitalization for seizure like activity, left sided weakness and slurred speech that was concerning for acute stroke. Patient was recently admitted for CHF and discharged on 8/16. Imaging finding during recent admission were consistent with chronic infarcts in the cerebral cortex and cerebellum. Acute stroke was ruled out and patient was started on Keppra for possible seizures, and full dose aspirin. Neurology recommended  Placement of loop recorder in the outpatient. Follow up with PCP, electrophysiology, neurology and Ortho was recommended. Since discharge patient has been stable and accompanied by sister. Mentation at baseline, no changes in speech and no deficits or weakness reported. Patient is taking medications as prescribed. Patient denies CP, SOB, abdominal pain, Headaches, fever, chills.  Smoking status reviewed   ROS: all other systems were reviewed and are negative other than in the HPI   Past Medical History:  Diagnosis Date  . Acute on chronic systolic heart failure, NYHA class 3 ([place])   . AKI (acute kidney injury) ([place])   . Anemia   . BKA stump complication ([place])   . Blind left eye   . Blind left eye   . Cardiomyopathy, dilated ([place])   . CHF (congestive heart failure) ([place])   . Combined systolic and diastolic congestive heart failure ([place])   . Congestive dilated cardiomyopathy ([place]) 07/11/2015  . Diabetes mellitus   . Diabetic neuropathy associated with type 2 diabetes mellitus ([place])   . Hypertension   . Neuropathy   . Noncompliance with medications   . Open knee wound 10/2015   on rt bka  . PAD (peripheral artery disease) ([place])   . Protein calorie malnutrition ([place])   .  Shortness of breath dyspnea     Past Surgical History:  Procedure Laterality Date  . AMPUTATION Right 09/26/2013   Procedure: AMPUTATION BELOW KNEE;  Surgeon: Rozanna Box, MD;  Location: [facility];  Service: Orthopedics;  Laterality: Right;  . AMPUTATION Right 05/01/2015   Procedure: REVISION OF RIGHT TRANSTIBIAL AMPUTATION ;  Surgeon: Newt Minion, MD;  Location: [facility];  Service: Orthopedics;  Laterality: Right;  . APPLICATION OF WOUND VAC  08/10/2016   Procedure: APPLICATION OFI NCISONAL  WOUND VAC;  Surgeon: Newt Minion, MD;  Location: [facility];  Service: Orthopedics;;  . CARDIAC CATHETERIZATION N/A 07/13/2015   Procedure: Right/Left Heart Cath and Coronary Angiography;  Surgeon: Jolaine Artist, MD;  Location: [facility];  Service: Cardiovascular;  Laterality: N/A;  . COLONOSCOPY    . I&D EXTREMITY Right 09/24/2013   Procedure: IRRIGATION AND DEBRIDEMENT RIGHT FOOT ULCER;  Surgeon: Renette Butters, MD;  Location: [facility];  Service: Orthopedics;  Laterality: Right;  . I&D EXTREMITY Right 09/26/2013   Procedure: Repeat IRRIGATION AND DEBRIDEMENT Right Foot Ulcer;  Surgeon: Rozanna Box, MD;  Location: [facility];  Service: Orthopedics;  Laterality: Right;  . MULTIPLE TOOTH EXTRACTIONS    . SKIN SPLIT GRAFT Right 08/10/2016   Procedure: SKIN GRAFT SPLIT THICKNESS RIGHT LEG;  Surgeon: Newt Minion, MD;  Location: [facility];  Service: Orthopedics;  Laterality: Right;  . STUMP REVISION Right 04/20/2015   Procedure: Revision Right Below Knee Amputation, Apply  Wound VAC;  Surgeon: Newt Minion, MD;  Location: [facility];  Service: Orthopedics;  Laterality: Right;  . TONSILLECTOMY      Past medical history, surgical, family, and social history reviewed and updated in the EMR as appropriate.  Objective:  BP 138/90   Pulse 69   Temp 98.3 F (36.8 C) (Oral)   Wt 210 lb (95.3 kg) Comment: at the hospital on satruday  SpO2 96%   BMI 28.48 kg/m   Vitals and nursing note reviewed  General: NAD,  pleasant, able to participate in exam Cardiac: RRR, normal heart sounds, no murmurs. 2+ radial and PT pulses bilaterally Respiratory: CTAB, normal effort, No wheezes, rales or rhonchi Abdomen: soft, nontender, nondistended, no hepatic or splenomegaly, +BS Extremities: Right BKA, no swelling noted wrapped with ace bandage. Left extremities also completely wrapped no signs of drainage, or pain. Skin: warm and dry, no rashes noted Neuro: alert and oriented x4, no focal deficits noted. CNII-XII are intact. Sensation and strength intact bilaterally upper and lower extremities Psych: Normal affect and mood   Assessment & Plan:   #Hospital follow up Patient is doing well since discharge has a appointment with Dr. Sharol Given on 8/28 for wound care and a cardiology appointment for CHF management on 8/20. No signs of fluid overload. Will arrange for follow up with Neurology and Electrophysiology for loop recorder placement. Will also like patient to seen PCP for further management of chronic problems ( T2DM, HTN .Marland Kitchen). No acute concern at the moment. --Will order CBC, CMP, Phos and Mag as requested in d/c summary. --Referral to outpatient Neurology and Electrophysiology --Scheduled appt with PCP 9/12 --Follow up on Dr. Sharol Given appointment on 8/28    Marjie Skiff, MD [place] PGY-2

## 2016-10-30 ENCOUNTER — Ambulatory Visit (INDEPENDENT_AMBULATORY_CARE_PROVIDER_SITE_OTHER): Payer: Medicaid Other | Admitting: Orthopedic Surgery

## 2016-10-30 DIAGNOSIS — T879 Unspecified complications of amputation stump: Secondary | ICD-10-CM

## 2016-10-30 DIAGNOSIS — S81001S Unspecified open wound, right knee, sequela: Secondary | ICD-10-CM

## 2016-10-30 NOTE — Progress Notes (Signed)
Office Visit Note   Patient: [name]           Date of Birth: [birth year]           MRN: [id number] Visit Date: 10/30/2016              Requested by: Steve Rattler, DO [place], [place] [address] PCP: Steve Rattler, DO  Chief Complaint  Patient presents with  . Right Leg - Follow-up    Anterior residual limb wound  . Left Leg - Follow-up    Wound left leg wearing profore dressing       HPI: Patient presents in follow-up for bilateral lower extremities. Patient was most recently hospitalized for seizure activity with left sided weakness he was started on Keppra for possible seizures. By report home health has been unable to provide occupational therapy physical therapy services due to insurance. 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 Patient crawls to ambulate. He underwent compression wraps on his last visit but there were no dressings present at this time.  Assessment & Plan: Visit Diagnoses:  1. Complications, amputation stump late ([place])   2. Open wound of right knee, sequela     Plan: We will apply Silvadene plus 4 x 4 plus an Ace wrap for the wounds for both lower extremities. A shunt does have Silvadene at home we will give him some 4 x 4's and Ace wrap so he has dressing supplies follow-up in 2 weeks.  Follow-Up Instructions: Return in about 2 weeks (around 11/13/2016).   Ortho Exam  Patient is alert, oriented, no adenopathy, well-dressed, normal affect, normal respiratory effort. Examination patient is ambulating in a wheelchair. His left leg has multiple venous stasis ulcers as well as an ulcer over the knee from crawling which is 2 cm in diameter down to healthy granulation tissue. Right BKA is stable however patient has a 5 cm ulcer over the tibial tubercle from crawling on his knees. The ulcer is 3 mm deep. There is no cellulitis no drainage no odor no signs of infection there is good  granulation tissue.  Imaging: No results found. No images are attached to the encounter.  Labs: Lab Results  Component Value Date   HGBA1C 7.2 (H) 10/14/2016   HGBA1C 7.9 (H) 09/24/2016   HGBA1C 10.1 (H) 05/24/2016   ESRSEDRATE 114 (H) 09/23/2013   CRP 37.3 (H) 09/23/2013   REPTSTATUS 10/19/2016 FINAL 10/14/2016   REPTSTATUS 10/19/2016 FINAL 10/14/2016   GRAMSTAIN  09/24/2013    MODERATE WBC PRESENT,BOTH PMN AND MONONUCLEAR MODERATE GRAM POSITIVE COCCI IN PAIRS FEW GRAM VARIABLE ROD Performed at [facility]  09/24/2013    MODERATE WBC PRESENT,BOTH PMN AND MONONUCLEAR ABUNDANT GRAM POSITIVE COCCI IN PAIRS FEW GRAM VARIABLE ROD Performed at [facility]   CULT NO GROWTH 5 DAYS 10/14/2016   CULT NO GROWTH 5 DAYS 10/14/2016    Orders:  No orders of the defined types were placed in this encounter.  No orders of the defined types were placed in this encounter.    Procedures: No procedures performed  Clinical Data: No additional findings.  ROS:  All other systems negative, except as noted in the HPI. Review of Systems  Objective: Vital Signs: There were no vitals taken for this visit.  Specialty Comments:  No specialty comments available.  PMFS History: Patient Active Problem List   Diagnosis Date  Noted  . Embolic stroke ([place]) 43/15/4008  . 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 Acute on chronic systolic heart failure, NYHA class 3 ([place]) 10/19/2016  . Moderate to severe pulmonary hypertension ([place]) 10/19/2016  . Severe tricuspid regurgitation by prior echocardiogram 10/19/2016  . Diabetes mellitus type 2 in obese ([place]) 10/19/2016  . HLD (hyperlipidemia) 10/19/2016  . Seizure ([place]) 10/19/2016  . Fluid overload 10/14/2016  . 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 ([place]) 10/09/2016  . Cardiorenal syndrome with renal failure 09/28/2016  . Acute urinary retention 09/26/2016  .  Acute on chronic combined systolic and diastolic CHF (congestive heart failure) ([place]) 09/26/2016  . Scrotal edema 09/24/2016  . Multiple open wounds of lower leg, initial encounter 09/24/2016  . Tinea of groin 09/24/2016  . Chronic kidney disease (CKD), stage III (moderate) 06/07/2016  . Chronic combined systolic and diastolic congestive heart failure ([place])   . Protein calorie malnutrition ([place]) 07/12/2015  . Congestive dilated cardiomyopathy ([place]) 07/11/2015  . Pressure ulcer 07/09/2015  . Open knee wound 07/06/2015  . History of right below knee amputation ([place]) 02/22/2015  . PAD (peripheral artery disease) ([place]) 02/22/2015  . Blindness of left eye 01/04/2015  . Complications, amputation stump late ([place]) 08/06/2014  . Diabetic neuropathy associated with type 2 diabetes mellitus ([place]) 12/01/2013  . Noncompliance with medications 07/13/2010  . OTHER SPEC TYPES SCHIZOPHRENIA UNSPEC CONDITION 05/24/2009  . Obesity, unspecified 06/09/2007  . DM (diabetes mellitus), type 2, uncontrolled ([place]) 05/02/2006  . Essential hypertension, benign 05/02/2006   Past Medical History:  Diagnosis Date  . Acute on chronic systolic heart failure, NYHA class 3 ([place])   . AKI (acute kidney injury) ([place])   . Anemia   . BKA stump complication ([place])   . Blind left eye   . Blind left eye   . Cardiomyopathy, dilated ([place])   . CHF (congestive heart failure) ([place])   . Combined systolic and diastolic congestive heart failure ([place])   . Congestive dilated cardiomyopathy ([place]) 07/11/2015  . Diabetes mellitus   . Diabetic neuropathy associated with type 2 diabetes mellitus ([place])   . Hypertension   . Neuropathy   . Noncompliance with medications   . Open knee wound 10/2015   on rt bka  . PAD (peripheral artery disease) ([place])   . Protein calorie malnutrition ([place])   . Shortness of breath dyspnea     Family History  Problem Relation Age of Onset  . Cancer Mother   . Peripheral vascular disease Father       Past Surgical History:  Procedure Laterality Date  . AMPUTATION Right 09/26/2013   Procedure: AMPUTATION BELOW KNEE;  Surgeon: Rozanna Box, MD;  Location: [facility];  Service: Orthopedics;  Laterality: Right;  . AMPUTATION Right 05/01/2015   Procedure: REVISION OF RIGHT TRANSTIBIAL AMPUTATION ;  Surgeon: Newt Minion, MD;  Location: [facility];  Service: Orthopedics;  Laterality: Right;  . APPLICATION OF WOUND VAC  08/10/2016   Procedure: APPLICATION OFI NCISONAL  WOUND VAC;  Surgeon: Newt Minion, MD;  Location: [facility];  Service: Orthopedics;;  . CARDIAC CATHETERIZATION N/A 07/13/2015   Procedure: Right/Left Heart Cath and Coronary Angiography;  Surgeon: Jolaine Artist, MD;  Location: [facility];  Service: Cardiovascular;  Laterality: N/A;  . COLONOSCOPY    . I&D EXTREMITY Right 09/24/2013   Procedure: IRRIGATION AND DEBRIDEMENT RIGHT FOOT ULCER;  Surgeon: Renette Butters, MD;  Location: [facility];  Service:  Orthopedics;  Laterality: Right;  . I&D EXTREMITY Right 09/26/2013   Procedure: Repeat IRRIGATION AND DEBRIDEMENT Right Foot Ulcer;  Surgeon: Rozanna Box, MD;  Location: [facility];  Service: Orthopedics;  Laterality: Right;  . MULTIPLE TOOTH EXTRACTIONS    . SKIN SPLIT GRAFT Right 08/10/2016   Procedure: SKIN GRAFT SPLIT THICKNESS RIGHT LEG;  Surgeon: Newt Minion, MD;  Location: [facility];  Service: Orthopedics;  Laterality: Right;  . STUMP REVISION Right 04/20/2015   Procedure: Revision Right Below Knee Amputation, Apply Wound VAC;  Surgeon: Newt Minion, MD;  Location: [facility];  Service: Orthopedics;  Laterality: Right;  . TONSILLECTOMY     Social History   Occupational History  . Not on file.   Social History Main Topics  . Smoking status: Former Smoker    Years: 20.00    Quit date: 05/10/2000  . Smokeless tobacco: Former Systems developer    Quit date: 02/22/2000  . Alcohol use No  . Drug use: No  . Sexual activity: Not on file

## 2016-10-31 ENCOUNTER — Other Ambulatory Visit (HOSPITAL_COMMUNITY): Payer: Self-pay

## 2016-10-31 NOTE — Progress Notes (Signed)
Paramedicine Encounter    Patient ID: [name], male    DOB: September 21, 1955, 61 y.o.   MRN: [id number]    Patient Care Team: Steve Rattler, DO as PCP - General  Patient Active Problem List   Diagnosis Date Noted  . Embolic stroke ([place]) 03/50/0938  . Cardiomyopathy, dilated ([place]ulah) 10/19/2016  . Acute on chronic systolic heart failure, NYHA class 3 ([place] City) 10/19/2016  . Moderate to severe pulmonary hypertension ([place]) 10/19/2016  . Severe tricuspid regurgitation by prior echocardiogram 10/19/2016  . Diabetes mellitus type 2 in obese ([place]) 10/19/2016  . HLD (hyperlipidemia) 10/19/2016  . Seizure ([place]) 10/19/2016  . Fluid overload 10/14/2016  . 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 ([place]) 10/09/2016  . Cardiorenal syndrome with renal failure 09/28/2016  . Acute urinary retention 09/26/2016  . Acute on chronic combined systolic and diastolic CHF (congestive heart failure) ([place]) 09/26/2016  . Scrotal edema 09/24/2016  . Multiple open wounds of lower leg, initial encounter 09/24/2016  . Tinea of groin 09/24/2016  . Chronic kidney disease (CKD), stage III (moderate) 06/07/2016  . Chronic combined systolic and diastolic congestive heart failure ([place])   . Protein calorie malnutrition ([place]) 07/12/2015  . Congestive dilated cardiomyopathy ([place]) 07/11/2015  . Pressure ulcer 07/09/2015  . Open knee wound 07/06/2015  . History of right below knee amputation ([place]) 02/22/2015  . PAD (peripheral artery disease) ([place] C[place]) 02/22/2015  . Blindness of left eye 01/04/2015  . Complications, amputation stump late ([place]) 08/06/2014  . Diabetic neuropathy associated with type 2 diabetes mellitus ([place]) 12/01/2013  . Noncompliance with medications 07/13/2010  . OTHER SPEC TYPES SCHIZOPHRENIA UNSPEC CONDITION 05/24/2009  . Obesity, unspecified 06/09/2007  . DM (diabetes mellitus), type 2, uncontrolled ([place]) 05/02/2006  . Essential hypertension, benign  05/02/2006    Current Outpatient Prescriptions:  .  aspirin 325 MG tablet, Take 1 tablet (325 mg total) by mouth daily., Disp: 30 tablet, Rfl: 0 .  bisoprolol (ZEBETA) 5 MG tablet, Take 1 tablet (5 mg total) by mouth daily., Disp: 30 tablet, Rfl: 1 .  furosemide (LASIX) 80 MG tablet, Take 80 mg (1 tabs) in am and 40 mg (1/2 tab) in pm (Patient taking differently: Take 80 mg by mouth daily. ), Disp: 45 tablet, Rfl: 6 .  metFORMIN (GLUCOPHAGE) 500 MG tablet, Take 1 tablet (500 mg total) by mouth 2 (two) times daily with a meal., Disp: 60 tablet, Rfl: 6 .  potassium chloride SA (K-DUR,KLOR-CON) 20 MEQ tablet, Take 40 mEq by mouth daily. , Disp: , Rfl:  .  sacubitril-valsartan (ENTRESTO) 49-51 MG, Take 1 tablet by mouth 2 (two) times daily., Disp: 60 tablet, Rfl: 3 .  tamsulosin (FLOMAX) 0.4 MG CAPS capsule, Take 1 capsule (0.4 mg total) by mouth daily after supper., Disp: 30 capsule, Rfl: 0 .  atorvastatin (LIPITOR) 40 MG tablet, Take 1 tablet (40 mg total) by mouth daily., Disp: 30 tablet, Rfl: 0 .  bacitracin ointment, Apply topically 2 (two) times daily., Disp: 120 g, Rfl: 0 .  brimonidine (ALPHAGAN) 0.2 % ophthalmic solution, PLACE 1 DROP INTO THE LEFT EYE 2 (TWO) TIMES DAILY. (Patient taking differently: Place 1 drop into the left eye daily. ), Disp: 5 mL, Rfl: 0 .  dorzolamide-timolol (COSOPT) 22.3-6.8 MG/ML ophthalmic solution, Place 1 drop into the left eye 2 (two) times daily. (Patient taking differently: Place 1 drop into the left eye daily. ), Disp: 10 mL, Rfl: 12 .  insulin glargine (LANTUS) 100 UNIT/ML  injection, Inject 10 Units into the skin at bedtime. , Disp: , Rfl:  .  levETIRAcetam (KEPPRA) 750 MG tablet, Take 1 tablet (750 mg total) by mouth 2 (two) times daily., Disp: 60 tablet, Rfl: 0 .  metolazone (ZAROXOLYN) 2.5 MG tablet, Take 1 tablet (2.5 mg total) by mouth daily., Disp: 30 tablet, Rfl: 0 .  vitamin A 10000 UNIT capsule, Take 10,000 Units by mouth daily., Disp: , Rfl:  No  Known Allergies   Social History   Social History  . Marital status: Married    Spouse name: N/A  . Number of children: N/A  . Years of education: N/A   Occupational History  . Not on file.   Social History Main Topics  . Smoking status: Former Smoker    Years: 20.00    Quit date: 05/10/2000  . Smokeless tobacco: Former Systems developer    Quit date: 02/22/2000  . Alcohol use No  . Drug use: No  . Sexual activity: Not on file   Other Topics Concern  . Not on file   Social History Narrative   ** Merged History Encounter **       Lives alone.     Physical Exam  Pulmonary/Chest: No respiratory distress. He has no wheezes.  Abdominal: He exhibits no distension. There is no tenderness. There is no guarding.  Musculoskeletal: He exhibits edema.  Skin: Skin is warm and dry. He is not diaphoretic.        Future Appointments Date Time Provider [place]  11/14/2016 8:30 AM Trinna Post [facility]  11/15/2016 1:45 PM Newt Minion, MD [facility] None  11/22/2016 11:00 AM [facility] PA/NP [facility] None    ATF pt [name] x4 sitting in front of the tv in his wheelchair. Pt's home care assistant was here cooking and cleaning for him.  Pt stated that he hasn't had a seizure within the past week.  He denies sob, dizziness and chest pain.  His sister is still coming over at night and giving him his meds.  He has taken almost all of his pm meds and a few of the am meds.  I told him that his seizure meds are in his am meds and he need to take both.  If I visit next week and he still doing the same I will put the bisoprolol in the pm slot.  rx bottles verified and pill box refilled.  BP (!) 144/100 (BP Location: Right Arm, Patient Position: Sitting)   Pulse 78   Resp 16   SpO2 98%   cbg [place], EMT Paramedic 10/31/2016    ACTION: Home visit completed

## 2016-11-07 ENCOUNTER — Other Ambulatory Visit (HOSPITAL_COMMUNITY): Payer: Self-pay

## 2016-11-07 NOTE — Progress Notes (Signed)
Paramedicine Encounter    Patient ID: [name], male    DOB: 1955-04-02, 61 y.o.   MRN: [id number]    Patient Care Team: Steve Rattler, DO as PCP - General  Patient Active Problem List   Diagnosis Date Noted  . Embolic stroke ([place]) 81/03/7508  . Cardiomyopathy, dilated ([place]) 10/19/2016  . Acute on chronic systolic heart failure, NYHA class 3 ([place]) 10/19/2016  . Moderate to severe pulmonary hypertension ([place]) 10/19/2016  . Severe tricuspid regurgitation by prior echocardiogram 10/19/2016  . Diabetes mellitus type 2 in obese ([place]) 10/19/2016  . HLD (hyperlipidemia) 10/19/2016  . Seizure ([place]) 10/19/2016  . Fluid overload 10/14/2016  . 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 Cardiorenal syndrome with renal failure 09/28/2016  . Acute urinary retention 09/26/2016  . Acute on chronic combined systolic and diastolic CHF (congestive heart failure) (Fresno) 09/26/2016  . Scrotal edema 09/24/2016  . Multiple open wounds of lower leg, initial encounter 09/24/2016  . Tinea of groin 09/24/2016  . Chronic kidney disease (CKD), stage III (moderate) 06/07/2016  . Chronic combined systolic and diastolic congestive heart failure ([place])   . Protein calorie malnutrition ([place]) 07/12/2015  . Congestive dilated cardiomyopathy ([place]) 07/11/2015  . Pressure ulcer 07/09/2015  . Open knee wound 07/06/2015  . History of right below knee amputation ([place]) 02/22/2015  . PAD (peripheral artery disease) ([place]) 02/22/2015  . Blindness of left eye 01/04/2015  . Complications, amputation stump late ([place]) 08/06/2014  . Diabetic neuropathy associated with type 2 diabetes mellitus ([place]ram[place]) 12/01/2013  . Noncompliance with medications 07/13/2010  . OTHER SPEC TYPES SCHIZOPHRENIA UNSPEC CONDITION 05/24/2009  . Obesity, unspecified 06/09/2007  . DM (diabetes mellitus), type 2, uncontrolled ([place]) 05/02/2006  . Essential hypertension, benign  05/02/2006    Current Outpatient Prescriptions:  .  aspirin 325 MG tablet, Take 1 tablet (325 mg total) by mouth daily., Disp: 30 tablet, Rfl: 0 .  atorvastatin (LIPITOR) 40 MG tablet, Take 1 tablet (40 mg total) by mouth daily., Disp: 30 tablet, Rfl: 0 .  bisoprolol (ZEBETA) 5 MG tablet, Take 1 tablet (5 mg total) by mouth daily., Disp: 30 tablet, Rfl: 1 .  furosemide (LASIX) 80 MG tablet, Take 80 mg (1 tabs) in am and 40 mg (1/2 tab) in pm (Patient taking differently: Take 80 mg by mouth daily. ), Disp: 45 tablet, Rfl: 6 .  metFORMIN (GLUCOPHAGE) 500 MG tablet, Take 1 tablet (500 mg total) by mouth 2 (two) times daily with a meal., Disp: 60 tablet, Rfl: 6 .  potassium chloride SA (K-DUR,KLOR-CON) 20 MEQ tablet, Take 40 mEq by mouth daily. , Disp: , Rfl:  .  sacubitril-valsartan (ENTRESTO) 49-51 MG, Take 1 tablet by mouth 2 (two) times daily., Disp: 60 tablet, Rfl: 3 .  tamsulosin (FLOMAX) 0.4 MG CAPS capsule, Take 1 capsule (0.4 mg total) by mouth daily after supper., Disp: 30 capsule, Rfl: 0 .  bacitracin ointment, Apply topically 2 (two) times daily., Disp: 120 g, Rfl: 0 .  brimonidine (ALPHAGAN) 0.2 % ophthalmic solution, PLACE 1 DROP INTO THE LEFT EYE 2 (TWO) TIMES DAILY. (Patient taking differently: Place 1 drop into the left eye daily. ), Disp: 5 mL, Rfl: 0 .  dorzolamide-timolol (COSOPT) 22.3-6.8 MG/ML ophthalmic solution, Place 1 drop into the left eye 2 (two) times daily. (Patient taking differently: Place 1 drop into the left eye daily. ), Disp: 10 mL, Rfl: 12 .  insulin glargine (LANTUS) 100 UNIT/ML  injection, Inject 10 Units into the skin at bedtime. , Disp: , Rfl:  .  levETIRAcetam (KEPPRA) 750 MG tablet, Take 1 tablet (750 mg total) by mouth 2 (two) times daily., Disp: 60 tablet, Rfl: 0 .  metolazone (ZAROXOLYN) 2.5 MG tablet, Take 1 tablet (2.5 mg total) by mouth daily., Disp: 30 tablet, Rfl: 0 .  vitamin A 10000 UNIT capsule, Take 10,000 Units by mouth daily., Disp: , Rfl:  No  Known Allergies   Social History   Social History  . Marital status: Married    Spouse name: N/A  . Number of children: N/A  . Years of education: N/A   Occupational History  . Not on file.   Social History Main Topics  . Smoking status: Former Smoker    Years: 20.00    Quit date: 05/10/2000  . Smokeless tobacco: Former Systems developer    Quit date: 02/22/2000  . Alcohol use No  . Drug use: No  . Sexual activity: Not on file   Other Topics Concern  . Not on file   Social History Narrative   ** Merged History Encounter **       Lives alone.     Physical Exam  Pulmonary/Chest: No respiratory distress. He has no wheezes.  Musculoskeletal: He exhibits edema.  Skin: Skin is warm and dry. He is not diaphoretic.        Future Appointments Date Time Provider [place]  11/14/2016 8:30 AM Trinna Post [facility]  11/15/2016 1:45 PM Newt Minion, MD [facility] None  11/22/2016 11:00 AM [facility] PA/NP [facility] None    ATF pt [name] x4 sitting in front of the tv in his wheelchair.  Pt denies sob, dizziness, chest pain and headache.  He stated that he had a seizure since the first time a few weeks ago.  Pt has missed several medication doses throughout this week but he took today morning dose.  rx bottles verified and pill box refilled.    BP (!) 126/92 (BP Location: Right Arm, Patient Position: Sitting, Cuff Size: Normal)   Pulse 81   Resp 16   SpO2 97%    Stepanie Graver, EMT Paramedic 11/07/2016    ACTION: Home visit completed

## 2016-11-08 ENCOUNTER — Telehealth (INDEPENDENT_AMBULATORY_CARE_PROVIDER_SITE_OTHER): Payer: Self-pay | Admitting: *Deleted

## 2016-11-08 NOTE — Telephone Encounter (Signed)
[facility] called left message on triage vm stating has order for compression stocking to L Leg, has wound of 5x4cm non granulated tissue with clear fluid seepinp, has another blister medial to this, states is not comfortable with putting on the compression stocking just yet. Wants to put on zerofoam with vaseline gauge and aquacell along with compression stockings. Wants to make sure ok with this. Please call [phone].

## 2016-11-09 NOTE — Telephone Encounter (Signed)
I called and spoke with [name] advised ok for xeroform or vaseline gauze with aquacell. Patient is also applying coco butter, advised against this. They are going to increase frequency to 2-3xs week.

## 2016-11-10 ENCOUNTER — Other Ambulatory Visit (HOSPITAL_COMMUNITY): Payer: Self-pay | Admitting: Adult Health

## 2016-11-14 ENCOUNTER — Other Ambulatory Visit (HOSPITAL_COMMUNITY): Payer: Self-pay

## 2016-11-14 ENCOUNTER — Ambulatory Visit: Payer: Medicaid Other | Admitting: Family Medicine

## 2016-11-14 NOTE — Progress Notes (Signed)
Paramedicine Encounter    Patient ID: [name], male    DOB: [birth year], 61 y.o.   MRN: [id number]    Patient Care Team: Steve Rattler, DO as PCP - General  Patient Active Problem List   Diagnosis Date Noted  . Embolic stroke ([place]) 54/62/7035  . Cardiomyopathy, dilated ([place]) 10/19/2016  . Acute on chronic systolic heart failure, NYHA class 3 ([place]) 10/19/2016  . Moderate to severe pulmonary hypertension ([place]) 10/19/2016  . Severe tricuspid regurgitation by prior echocardiogram 10/19/2016  . Diabetes mellitus type 2 in obese ([place]) 10/19/2016  . HLD (hyperlipidemia) 10/19/2016  . Seizure ([place]) 10/19/2016  . Fluid overload 10/14/2016  . 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 Cardiorenal syndrome with renal failure 09/28/2016  . Acute urinary retention 09/26/2016  . Acute on chronic combined systolic and diastolic CHF (congestive heart failure) ([place]) 09/26/2016  . Scrotal edema 09/24/2016  . Multiple open wounds of lower leg, initial encounter 09/24/2016  . Tinea of groin 09/24/2016  . Chronic kidney disease (CKD), stage III (moderate) 06/07/2016  . Chronic combined systolic and diastolic congestive heart failure ([place])   . Protein calorie malnutrition ([place]) 07/12/2015  . Congestive dilated cardiomyopathy ([place]) 07/11/2015  . Pressure ulcer 07/09/2015  . Open knee wound 07/06/2015  . History of right below knee amputation ([place]) 02/22/2015  . PAD (peripheral artery disease) ([place]) 02/22/2015  . Blindness of left eye 01/04/2015  . Complications, amputation stump late ([place]) 08/06/2014  . Diabetic neuropathy associated with type 2 diabetes mellitus ([place]) 12/01/2013  . Noncompliance with medications 07/13/2010  . OTHER SPEC TYPES SCHIZOPHRENIA UNSPEC CONDITION 05/24/2009  . Obesity, unspecified 06/09/2007  . DM (diabetes mellitus), type 2, uncontrolled ([place]) 05/02/2006  . Essential hypertension, benign  05/02/2006    Current Outpatient Prescriptions:  .  aspirin 325 MG tablet, Take 1 tablet (325 mg total) by mouth daily., Disp: 30 tablet, Rfl: 0 .  atorvastatin (LIPITOR) 40 MG tablet, Take 1 tablet (40 mg total) by mouth daily., Disp: 30 tablet, Rfl: 0 .  bisoprolol (ZEBETA) 5 MG tablet, Take 1 tablet (5 mg total) by mouth daily., Disp: 30 tablet, Rfl: 1 .  furosemide (LASIX) 80 MG tablet, Take 80 mg (1 tabs) in am and 40 mg (1/2 tab) in pm (Patient taking differently: Take 80 mg by mouth daily. ), Disp: 45 tablet, Rfl: 6 .  metFORMIN (GLUCOPHAGE) 500 MG tablet, Take 1 tablet (500 mg total) by mouth 2 (two) times daily with a meal., Disp: 60 tablet, Rfl: 6 .  potassium chloride SA (K-DUR,KLOR-CON) 20 MEQ tablet, Take 40 mEq by mouth daily. , Disp: , Rfl:  .  sacubitril-valsartan (ENTRESTO) 49-51 MG, Take 1 tablet by mouth 2 (two) times daily., Disp: 60 tablet, Rfl: 3 .  tamsulosin (FLOMAX) 0.4 MG CAPS capsule, Take 1 capsule (0.4 mg total) by mouth daily after supper., Disp: 30 capsule, Rfl: 0 .  bacitracin ointment, Apply topically 2 (two) times daily., Disp: 120 g, Rfl: 0 .  brimonidine (ALPHAGAN) 0.2 % ophthalmic solution, PLACE 1 DROP INTO THE LEFT EYE 2 (TWO) TIMES DAILY. (Patient taking differently: Place 1 drop into the left eye daily. ), Disp: 5 mL, Rfl: 0 .  dorzolamide-timolol (COSOPT) 22.3-6.8 MG/ML ophthalmic solution, Place 1 drop into the left eye 2 (two) times daily. (Patient taking differently: Place 1 drop into the left eye daily. ), Disp: 10 mL, Rfl: 12 .  insulin glargine (LANTUS) 100 UNIT/ML  injection, Inject 10 Units into the skin at bedtime. , Disp: , Rfl:  .  levETIRAcetam (KEPPRA) 750 MG tablet, Take 1 tablet (750 mg total) by mouth 2 (two) times daily., Disp: 60 tablet, Rfl: 0 .  metolazone (ZAROXOLYN) 2.5 MG tablet, Take 1 tablet (2.5 mg total) by mouth daily., Disp: 30 tablet, Rfl: 0 .  vitamin A 10000 UNIT capsule, Take 10,000 Units by mouth daily., Disp: , Rfl:  No  Known Allergies   Social History   Social History  . Marital status: Married    Spouse name: N/A  . Number of children: N/A  . Years of education: N/A   Occupational History  . Not on file.   Social History Main Topics  . Smoking status: Former Smoker    Years: 20.00    Quit date: 05/10/2000  . Smokeless tobacco: Former Systems developer    Quit date: 02/22/2000  . Alcohol use No  . Drug use: No  . Sexual activity: Not on file   Other Topics Concern  . Not on file   Social History Narrative   ** Merged History Encounter **       Lives alone.     Physical Exam  Pulmonary/Chest: No respiratory distress. He has no wheezes. He has no rales.  Musculoskeletal: He exhibits edema.  Skin: Skin is warm and dry. He is not diaphoretic.        Future Appointments Date Time Provider [place]  11/15/2016 1:45 PM Newt Minion, MD [facility] None  11/22/2016 11:00 AM [facility] PA/NP [facility] None  11/29/2016 10:35 AM Riccio, Gardiner Rhyme, DO [facility] [place]    ATF pt [name] x4 sitting in his wheelchair watching tv.  Pts homeaide is here cooking and cleaning. Pt has been taking his medications more in the afternoons.  I switched the dose of lasix 80mg  in the evening and the 40 mg in the am due to pt not taking as many am meds than pm.  I also put the bisoprolol in the pm slot due to the same reason.  Pt has large open sores on both of his knees and left leg.  Pt stated that he is considering getting the eye surgery and he has an consultation next week.  Pt denies sob, dizziness and lightheadedness.  rx bottles verified and pill box filled.  BP (!) 152/92   Pulse 73   Resp 16   SpO2 98%  cbg 142  Pt is still unable to weigh himself.   Demetria Copper, EMT Paramedic 11/14/2016    ACTION: Home visit completed

## 2016-11-15 ENCOUNTER — Ambulatory Visit (INDEPENDENT_AMBULATORY_CARE_PROVIDER_SITE_OTHER): Payer: Medicaid Other | Admitting: Orthopedic Surgery

## 2016-11-15 DIAGNOSIS — L97929 Non-pressure chronic ulcer of unspecified part of left lower leg with unspecified severity: Secondary | ICD-10-CM

## 2016-11-15 DIAGNOSIS — T879 Unspecified complications of amputation stump: Secondary | ICD-10-CM

## 2016-11-15 DIAGNOSIS — I87331 Chronic venous hypertension (idiopathic) with ulcer and inflammation of right lower extremity: Secondary | ICD-10-CM

## 2016-11-15 DIAGNOSIS — I87332 Chronic venous hypertension (idiopathic) with ulcer and inflammation of left lower extremity: Secondary | ICD-10-CM | POA: Diagnosis not present

## 2016-11-15 NOTE — Progress Notes (Signed)
Office Visit Note   Patient: [name]           Date of Birth: [birth year]           MRN: [id number] Visit Date: 11/15/2016              Requested by: Steve Rattler, DO [place], [place] [id number] PCP: Steve Rattler, DO  No chief complaint on file.     HPI: Patient is a 61 year old gentleman status post right transtibial amputation with a massive ulcer over the patella from crawling on his knees. Patient also has a smaller ulcer on the left leg with massive venous stasis swelling in both lower extremities. Patient states she's tried using cocoa butter on his legs. Patient denies any pain.  Assessment & Plan: Visit Diagnoses:  1. Complications, amputation stump late ([place])   2. 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  Follow-Up Instructions: Return in about 1 week (around 11/22/2016).   Ortho Exam  Patient is alert, oriented, no adenopathy, well-dressed, normal affect, normal respiratory effort. Examination patient is angulated in a wheelchair. Examination the left lower extremity he has massive multiple Ina stasis ulcers including a abrasion on the patella from patient crawling on his knee. Almost all the wounds are 2 cm in diameter and 1 mm deep. There is pitting edema with weeping fluid from the left leg. Examination the right transtibial imitation patient has increased venous stasis swelling he has a 6 cm diameter wound over the patella from crawling on his knee there is granulation tissue at the base there is clear drainage there is no cellulitis there is no exposed bone or tendon.  Imaging: No results found. No images are attached to the encounter.  Labs: Lab Results  Component Value Date   HGBA1C 7.2 (H) 10/14/2016   HGBA1C 7.9 (H) 09/24/2016   HGBA1C 10.1 (H) 05/24/2016   ESRSEDRATE 114 (H) 09/23/2013   CRP 37.3 (H) 09/23/2013   REPTSTATUS 10/19/2016 FINAL 10/14/2016   REPTSTATUS 10/19/2016 FINAL 10/14/2016   GRAMSTAIN  09/24/2013    MODERATE WBC PRESENT,BOTH PMN AND MONONUCLEAR MODERATE GRAM POSITIVE COCCI IN PAIRS FEW GRAM VARIABLE ROD Performed at [facility]  09/24/2013    MODERATE WBC PRESENT,BOTH PMN AND MONONUCLEAR ABUNDANT GRAM POSITIVE COCCI IN PAIRS FEW GRAM VARIABLE ROD Performed at [facility]   CULT NO GROWTH 5 DAYS 10/14/2016   CULT NO GROWTH 5 DAYS 10/14/2016    Orders:  No orders of the defined types were placed in this encounter.  No orders of the defined types were placed in this encounter.    Procedures: No procedures performed  Clinical Data: No additional findings.  ROS:  All other systems negative, except as noted in the HPI. Review of Systems  Objective: Vital Signs: There were no vitals taken for this visit.  Specialty Comments:  No specialty comments available.  PMFS History: Patient Active Problem List   Diagnosis Date Noted  . Embolic stroke ([place]) 19/14/7829  . Cardiomyopathy, dilated ([place]) 10/19/2016  . Acute on chronic systolic heart failure, NYHA class 3 ([place]) 10/19/2016  . Moderate to severe pulmonary hypertension ([place]) 10/19/2016  . Severe tricuspid regurgitation by prior echocardiogram 10/19/2016  . Diabetes mellitus type 2 in obese (  [place]) 10/19/2016  . HLD (hyperlipidemia) 10/19/2016  . Seizure ([place]) 10/19/2016  . Fluid overload 10/14/2016  . 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 Cardiorenal syndrome with renal failure 09/28/2016  . Acute urinary retention 09/26/2016  . Acute on chronic combined systolic and diastolic CHF (congestive heart failure) ([place]) 09/26/2016  . Scrotal edema 09/24/2016  . Multiple open wounds of lower leg, initial encounter 09/24/2016  .  Tinea of groin 09/24/2016  . Chronic kidney disease (CKD), stage III (moderate) 06/07/2016  . Chronic combined systolic and diastolic congestive heart failure ([place])   . Protein calorie malnutrition ([place]) 07/12/2015  . Congestive dilated cardiomyopathy ([place]) 07/11/2015  . Pressure ulcer 07/09/2015  . Open knee wound 07/06/2015  . History of right below knee amputation ([place]) 02/22/2015  . PAD (peripheral artery disease) ([place]) 02/22/2015  . Blindness of left eye 01/04/2015  . Complications, amputation stump late ([place]) 08/06/2014  . Diabetic neuropathy associated with type 2 diabetes mellitus ([place]) 12/01/2013  . Noncompliance with medications 07/13/2010  . OTHER SPEC TYPES SCHIZOPHRENIA UNSPEC CONDITION 05/24/2009  . Obesity, unspecified 06/09/2007  . DM (diabetes mellitus), type 2, uncontrolled ([place]) 05/02/2006  . Essential hypertension, benign 05/02/2006   Past Medical History:  Diagnosis Date  . Acute on chronic systolic heart failure, NYHA class 3 ([place])   . AKI (acute kidney injury) ([place])   . Anemia   . BKA stump complication ([place])   . Blind left eye   . Blind left eye   . Cardiomyopathy, dilated ([place])   . CHF (congestive heart failure) ([place])   . Combined systolic and diastolic congestive heart failure ([place])   . Congestive dilated cardiomyopathy ([place]) 07/11/2015  . Diabetes mellitus   . Diabetic neuropathy associated with type 2 diabetes mellitus ([place])   . Hypertension   . Neuropathy   . Noncompliance with medications   . Open knee wound 10/2015   on rt bka  . PAD (peripheral artery disease) ([place])   . Protein calorie malnutrition ([place])   . Shortness of breath dyspnea     Family History  Problem Relation Age of Onset  . Cancer Mother   . Peripheral vascular disease Father     Past Surgical History:  Procedure Laterality Date  . AMPUTATION Right 09/26/2013   Procedure: AMPUTATION BELOW KNEE;  Surgeon: Rozanna Box, MD;  Location: [facility];  Service: Orthopedics;   Laterality: Right;  . AMPUTATION Right 05/01/2015   Procedure: REVISION OF RIGHT TRANSTIBIAL AMPUTATION ;  Surgeon: Newt Minion, MD;  Location: [facility];  Service: Orthopedics;  Laterality: Right;  . APPLICATION OF WOUND VAC  08/10/2016   Procedure: APPLICATION OFI NCISONAL  WOUND VAC;  Surgeon: Newt Minion, MD;  Location: [facility];  Service: Orthopedics;;  . CARDIAC CATHETERIZATION N/A 07/13/2015   Procedure: Right/Left Heart Cath and Coronary Angiography;  Surgeon: Jolaine Artist, MD;  Location: [facility];  Service: Cardiovascular;  Laterality: N/A;  . COLONOSCOPY    . I&D EXTREMITY Right 09/24/2013   Procedure: IRRIGATION AND DEBRIDEMENT RIGHT FOOT ULCER;  Surgeon: Renette Butters, MD;  Location: [facility];  Service: Orthopedics;  Laterality: Right;  . I&D EXTREMITY Right 09/26/2013   Procedure: Repeat IRRIGATION AND DEBRIDEMENT Right Foot Ulcer;  Surgeon: Rozanna Box, MD;  Location: [facility];  Service: Orthopedics;  Laterality: Right;  . MULTIPLE TOOTH EXTRACTIONS    . SKIN SPLIT GRAFT Right 08/10/2016   Procedure:  SKIN GRAFT SPLIT THICKNESS RIGHT LEG;  Surgeon: Newt Minion, MD;  Location: [facility];  Service: Orthopedics;  Laterality: Right;  . STUMP REVISION Right 04/20/2015   Procedure: Revision Right Below Knee Amputation, Apply Wound VAC;  Surgeon: Newt Minion, MD;  Location: [facility];  Service: Orthopedics;  Laterality: Right;  . TONSILLECTOMY     Social History   Occupational History  . Not on file.   Social History Main Topics  . Smoking status: Former Smoker    Years: 20.00    Quit date: 05/10/2000  . Smokeless tobacco: Former Systems developer    Quit date: 02/22/2000  . Alcohol use No  . Drug use: No  . Sexual activity: Not on file

## 2016-11-21 ENCOUNTER — Other Ambulatory Visit (HOSPITAL_COMMUNITY): Payer: Self-pay

## 2016-11-21 NOTE — Progress Notes (Signed)
Paramedicine Encounter    Patient ID: [name], male    DOB: [birth year], 61 y.o.   MRN: [id number]    Patient Care Team: Steve Rattler, DO as PCP - General  Patient Active Problem List   Diagnosis Date Noted  . Embolic stroke ([place]) 40/97/3532  . Cardiomyopathy, dilated ([place]) 10/19/2016  . Acute on chronic systolic heart failure, NYHA class 3 ([place]) 10/19/2016  . Moderate to severe pulmonary hypertension ([place]) 10/19/2016  . Severe tricuspid regurgitation by prior echocardiogram 10/19/2016  . Diabetes mellitus type 2 in obese ([place]) 10/19/2016  . HLD (hyperlipidemia) 10/19/2016  . Seizure ([place]) 10/19/2016  . Fluid overload 10/14/2016  . 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 Cardiorenal syndrome with renal failure 09/28/2016  . Acute urinary retention 09/26/2016  . Acute on chronic combined systolic and diastolic CHF (congestive heart failure) ([place]) 09/26/2016  . Scrotal edema 09/24/2016  . Multiple open wounds of lower leg, initial encounter 09/24/2016  . Tinea of groin 09/24/2016  . Chronic kidney disease (CKD), stage III (moderate) 06/07/2016  . Chronic combined systolic and diastolic congestive heart failure ([place])   . Protein calorie malnutrition ([place]) 07/12/2015  . Congestive dilated cardiomyopathy ([place]) 07/11/2015  . Pressure ulcer 07/09/2015  . Open knee wound 07/06/2015  . History of right below knee amputation ([place]) 02/22/2015  . PAD (peripheral artery disease) ([place]) 02/22/2015  . Blindness of left eye 01/04/2015  . Complications, amputation stump late ([place]) 08/06/2014  . Diabetic neuropathy associated with type 2 diabetes mellitus ([place]) 12/01/2013  . Noncompliance with medications 07/13/2010  . OTHER SPEC TYPES SCHIZOPHRENIA UNSPEC CONDITION 05/24/2009  . Obesity, unspecified 06/09/2007  . DM (diabetes mellitus), type 2, uncontrolled ([place]) 05/02/2006  . Essential hypertension, benign  05/02/2006    Current Outpatient Prescriptions:  .  aspirin 325 MG tablet, Take 1 tablet (325 mg total) by mouth daily., Disp: 30 tablet, Rfl: 0 .  atorvastatin (LIPITOR) 40 MG tablet, Take 1 tablet (40 mg total) by mouth daily., Disp: 30 tablet, Rfl: 0 .  bisoprolol (ZEBETA) 5 MG tablet, Take 1 tablet (5 mg total) by mouth daily., Disp: 30 tablet, Rfl: 1 .  furosemide (LASIX) 80 MG tablet, Take 80 mg (1 tabs) in am and 40 mg (1/2 tab) in pm (Patient taking differently: Take 80 mg by mouth daily. ), Disp: 45 tablet, Rfl: 6 .  levETIRAcetam (KEPPRA) 750 MG tablet, Take 1 tablet (750 mg total) by mouth 2 (two) times daily., Disp: 60 tablet, Rfl: 0 .  potassium chloride SA (K-DUR,KLOR-CON) 20 MEQ tablet, Take 40 mEq by mouth daily. , Disp: , Rfl:  .  sacubitril-valsartan (ENTRESTO) 49-51 MG, Take 1 tablet by mouth 2 (two) times daily., Disp: 60 tablet, Rfl: 3 .  tamsulosin (FLOMAX) 0.4 MG CAPS capsule, Take 1 capsule (0.4 mg total) by mouth daily after supper., Disp: 30 capsule, Rfl: 0 .  bacitracin ointment, Apply topically 2 (two) times daily., Disp: 120 g, Rfl: 0 .  brimonidine (ALPHAGAN) 0.2 % ophthalmic solution, PLACE 1 DROP INTO THE LEFT EYE 2 (TWO) TIMES DAILY. (Patient taking differently: Place 1 drop into the left eye daily. ), Disp: 5 mL, Rfl: 0 .  dorzolamide-timolol (COSOPT) 22.3-6.8 MG/ML ophthalmic solution, Place 1 drop into the left eye 2 (two) times daily. (Patient taking differently: Place 1 drop into the left eye daily. ), Disp: 10 mL, Rfl: 12 .  insulin glargine (LANTUS) 100 UNIT/ML injection, Inject 10  Units into the skin at bedtime. , Disp: , Rfl:  .  metFORMIN (GLUCOPHAGE) 500 MG tablet, Take 1 tablet (500 mg total) by mouth 2 (two) times daily with a meal., Disp: 60 tablet, Rfl: 6 .  metolazone (ZAROXOLYN) 2.5 MG tablet, Take 1 tablet (2.5 mg total) by mouth daily., Disp: 30 tablet, Rfl: 0 .  vitamin A 10000 UNIT capsule, Take 10,000 Units by mouth daily., Disp: , Rfl:  No  Known Allergies   Social History   Social History  . Marital status: Married    Spouse name: N/A  . Number of children: N/A  . Years of education: N/A   Occupational History  . Not on file.   Social History Main Topics  . Smoking status: Former Smoker    Years: 20.00    Quit date: 05/10/2000  . Smokeless tobacco: Former Systems developer    Quit date: 02/22/2000  . Alcohol use No  . Drug use: No  . Sexual activity: Not on file   Other Topics Concern  . Not on file   Social History Narrative   ** Merged History Encounter **       Lives alone.     Physical Exam  Pulmonary/Chest: No respiratory distress. He has no wheezes.  Abdominal: He exhibits no distension. There is no tenderness. There is no guarding.  Musculoskeletal: He exhibits edema.  Skin: Skin is warm and dry. He is not diaphoretic.        Future Appointments Date Time Provider [place]  11/22/2016 11:00 AM [facility] PA/NP [facility] None  11/22/2016 1:15 PM Newt Minion, MD [facility] None  11/29/2016 10:35 AM Steve Rattler, DO [facility]    ATF pt [name] x4 sitting in his wheelchair watching tv. Pt's home nurse just left from wrapping his legs.  Pt has eye surgery tomorrow, and his sister will be with him tomorrow after the surgery.  Pt has only taken his pm meds for the past 5 days and 2 day meds.  Pt denies sob, dizziness, and lightheadedness.  Pt stated that he hasn't had another seizure since the first one.  Keppra was added to his pill box. rx bottles verified and pill box refilled. Pt is still not weighing daily.     BP (!) 148/88   Pulse 79   Resp 16   SpO2 98%     Demetria Copper, EMT Paramedic 11/21/2016    ACTION: Home visit completed

## 2016-11-22 ENCOUNTER — Inpatient Hospital Stay (HOSPITAL_COMMUNITY): Admission: RE | Admit: 2016-11-22 | Payer: Medicaid Other | Source: Ambulatory Visit

## 2016-11-22 ENCOUNTER — Encounter (INDEPENDENT_AMBULATORY_CARE_PROVIDER_SITE_OTHER): Payer: Self-pay

## 2016-11-22 ENCOUNTER — Ambulatory Visit (INDEPENDENT_AMBULATORY_CARE_PROVIDER_SITE_OTHER): Payer: Medicaid Other | Admitting: Orthopedic Surgery

## 2016-11-23 ENCOUNTER — Encounter (INDEPENDENT_AMBULATORY_CARE_PROVIDER_SITE_OTHER): Payer: Self-pay

## 2016-11-26 ENCOUNTER — Ambulatory Visit (INDEPENDENT_AMBULATORY_CARE_PROVIDER_SITE_OTHER): Payer: Medicaid Other | Admitting: Orthopedic Surgery

## 2016-11-27 ENCOUNTER — Other Ambulatory Visit (HOSPITAL_COMMUNITY): Payer: Self-pay

## 2016-11-27 ENCOUNTER — Inpatient Hospital Stay (HOSPITAL_COMMUNITY): Admission: RE | Admit: 2016-11-27 | Payer: Medicaid Other | Source: Ambulatory Visit

## 2016-11-27 NOTE — Progress Notes (Signed)
Pt had a heart failure clinic appointment/hes a no show for today's appointment. I will see pt tomorrow at his home for our regular [facility] appointment.

## 2016-11-29 ENCOUNTER — Other Ambulatory Visit (HOSPITAL_COMMUNITY): Payer: Self-pay

## 2016-11-29 ENCOUNTER — Ambulatory Visit: Payer: Medicaid Other | Admitting: Family Medicine

## 2016-11-29 NOTE — Progress Notes (Signed)
Paramedicine Encounter    Patient ID: [name], male    DOB: 1956-02-24, 61 y.o.   MRN: [id number]    Patient Care Team: Steve Rattler, DO as PCP - General  Patient Active Problem List   Diagnosis Date Noted  . Embolic stroke ([place]) 29/51/8841  . Cardiomyopathy, dilated ([place]) 10/19/2016  . Acute on chronic systolic heart failure, NYHA class 3 ([place]) 10/19/2016  . Moderate to severe pulmonary hypertension ([place]) 10/19/2016  . Severe tricuspid regurgitation by prior echocardiogram 10/19/2016  . Diabetes mellitus type 2 in obese ([place]) 10/19/2016  . HLD (hyperlipidemia) 10/19/2016  . Seizure ([place]) 10/19/2016  . Fluid overload 10/14/2016  . 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 Cardiorenal syndrome with renal failure 09/28/2016  . Acute urinary retention 09/26/2016  . Acute on chronic combined systolic and diastolic CHF (congestive heart failure) ([place]) 09/26/2016  . Scrotal edema 09/24/2016  . Multiple open wounds of lower leg, initial encounter 09/24/2016  . Tinea of groin 09/24/2016  . Chronic kidney disease (CKD), stage III (moderate) 06/07/2016  . Chronic combined systolic and diastolic congestive heart failure ([place])   . Protein calorie malnutrition ([place]) 07/12/2015  . Congestive dilated cardiomyopathy ([place]) 07/11/2015  . Pressure ulcer 07/09/2015  . Open knee wound 07/06/2015  . History of right below knee amputation ([place]) 02/22/2015  . PAD (peripheral artery disease) ([place]) 02/22/2015  . Blindness of left eye 01/04/2015  . Complications, amputation stump late ([place]) 08/06/2014  . Diabetic neuropathy associated with type 2 diabetes mellitus ([place]) 12/01/2013  . Noncompliance with medications 07/13/2010  . OTHER SPEC TYPES SCHIZOPHRENIA UNSPEC CONDITION 05/24/2009  . Obesity, unspecified 06/09/2007  . DM (diabetes mellitus), type 2, uncontrolled ([place]) 05/02/2006  . Essential hypertension, benign  05/02/2006    Current Outpatient Prescriptions:  .  aspirin 325 MG tablet, Take 1 tablet (325 mg total) by mouth daily., Disp: 30 tablet, Rfl: 0 .  atorvastatin (LIPITOR) 40 MG tablet, Take 1 tablet (40 mg total) by mouth daily., Disp: 30 tablet, Rfl: 0 .  bisoprolol (ZEBETA) 5 MG tablet, Take 1 tablet (5 mg total) by mouth daily., Disp: 30 tablet, Rfl: 1 .  furosemide (LASIX) 80 MG tablet, Take 80 mg (1 tabs) in am and 40 mg (1/2 tab) in pm (Patient taking differently: Take 80 mg by mouth daily. ), Disp: 45 tablet, Rfl: 6 .  metFORMIN (GLUCOPHAGE) 500 MG tablet, Take 1 tablet (500 mg total) by mouth 2 (two) times daily with a meal., Disp: 60 tablet, Rfl: 6 .  potassium chloride SA (K-DUR,KLOR-CON) 20 MEQ tablet, Take 40 mEq by mouth daily. , Disp: , Rfl:  .  sacubitril-valsartan (ENTRESTO) 49-51 MG, Take 1 tablet by mouth 2 (two) times daily., Disp: 60 tablet, Rfl: 3 .  bacitracin ointment, Apply topically 2 (two) times daily., Disp: 120 g, Rfl: 0 .  brimonidine (ALPHAGAN) 0.2 % ophthalmic solution, PLACE 1 DROP INTO THE LEFT EYE 2 (TWO) TIMES DAILY. (Patient taking differently: Place 1 drop into the left eye daily. ), Disp: 5 mL, Rfl: 0 .  dorzolamide-timolol (COSOPT) 22.3-6.8 MG/ML ophthalmic solution, Place 1 drop into the left eye 2 (two) times daily. (Patient taking differently: Place 1 drop into the left eye daily. ), Disp: 10 mL, Rfl: 12 .  insulin glargine (LANTUS) 100 UNIT/ML injection, Inject 10 Units into the skin at bedtime. , Disp: , Rfl:  .  levETIRAcetam (KEPPRA) 750 MG tablet, Take 1 tablet (  750 mg total) by mouth 2 (two) times daily., Disp: 60 tablet, Rfl: 0 .  metolazone (ZAROXOLYN) 2.5 MG tablet, Take 1 tablet (2.5 mg total) by mouth daily., Disp: 30 tablet, Rfl: 0 .  tamsulosin (FLOMAX) 0.4 MG CAPS capsule, Take 1 capsule (0.4 mg total) by mouth daily after supper., Disp: 30 capsule, Rfl: 0 .  vitamin A 10000 UNIT capsule, Take 10,000 Units by mouth daily., Disp: , Rfl:  No  Known Allergies   Social History   Social History  . Marital status: Married    Spouse name: N/A  . Number of children: N/A  . Years of education: N/A   Occupational History  . Not on file.   Social History Main Topics  . Smoking status: Former Smoker    Years: 20.00    Quit date: 05/10/2000  . Smokeless tobacco: Former Systems developer    Quit date: 02/22/2000  . Alcohol use No  . Drug use: No  . Sexual activity: Not on file   Other Topics Concern  . Not on file   Social History Narrative   ** Merged History Encounter **       Lives alone.     Physical Exam  Pulmonary/Chest: No respiratory distress.  Abdominal: He exhibits no distension.  Musculoskeletal: He exhibits edema.  Skin: Skin is warm and dry. He is not diaphoretic.        Future Appointments Date Time Provider [place]  12/06/2016 1:45 PM Newt Minion, MD [facility] None  12/25/2016 2:40 PM Troy Sine, MD [facility]    ATF pt [name] x4 sitting in his wheelchair watching tv.  Pt stated that his physical therapist just left prior to my arrival.  Today was their first visit together.  She told him that she was going to look into getting him a hospital bed.  Pt has a lot edema in both legs.  rx bottles verified and pill box refilled. Pt only took 5 doses this week of his meds.  Pt still isn't weighing daily.    **rx called in: Metformin and entresto  cbg 131 BP (!) 138/98   Pulse 90   Resp 16   SpO2 97%     Demetria Copper, EMT Paramedic 11/29/2016    ACTION: Home visit completed

## 2016-11-30 ENCOUNTER — Telehealth (INDEPENDENT_AMBULATORY_CARE_PROVIDER_SITE_OTHER): Payer: Self-pay

## 2016-11-30 NOTE — Telephone Encounter (Signed)
Patient sister is wanting to know if a Rx can be written for patient to have a hospital bed.  Cb# [phone].  Fax # [phone].  Please advise.  Thank You.

## 2016-12-03 NOTE — Telephone Encounter (Signed)
Will work on this as well as letter of medical necessity.

## 2016-12-05 ENCOUNTER — Telehealth (INDEPENDENT_AMBULATORY_CARE_PROVIDER_SITE_OTHER): Payer: Self-pay

## 2016-12-05 ENCOUNTER — Other Ambulatory Visit (HOSPITAL_COMMUNITY): Payer: Self-pay

## 2016-12-05 ENCOUNTER — Ambulatory Visit (INDEPENDENT_AMBULATORY_CARE_PROVIDER_SITE_OTHER): Payer: Self-pay | Admitting: Orthopedic Surgery

## 2016-12-05 ENCOUNTER — Encounter (INDEPENDENT_AMBULATORY_CARE_PROVIDER_SITE_OTHER): Payer: Self-pay

## 2016-12-05 NOTE — Telephone Encounter (Signed)
[place] nurse with [facility] called with several concerns regarding patient. She wanted Dr Sharol Given to know that she is very afraid patient is going to loose his left leg. She stated that there are blisters all up and down the patients left leg. They are unable to keep treatments/bandages on his legs. Each time they return to patients home he has removed the wraps. Also from this point forward they are not going to be able to continue to provide patient with [facility] care due to condition of patients home. Patient will need to have pest control before they can enter back to patients home. She reported that today when they saw patient that there were bedbugs as well as roaches all in patients bed. Please call her to further advise [phone]

## 2016-12-05 NOTE — Progress Notes (Signed)
Paramedicine Encounter    Patient ID: [name], male    DOB: Mar 01, 1956, 61 y.o.   MRN: [id number]    Patient Care Team: Steve Rattler, DO as PCP - General  Patient Active Problem List   Diagnosis Date Noted  . Embolic stroke ([place]) 45/80/9983  . Cardiomyopathy, dilated ([place]) 10/19/2016  . Acute on chronic systolic heart failure, NYHA class 3 ([place]) 10/19/2016  . Moderate to severe pulmonary hypertension ([place]) 10/19/2016  . Severe tricuspid regurgitation by prior echocardiogram 10/19/2016  . Diabetes mellitus type 2 in obese ([place]) 10/19/2016  . HLD (hyperlipidemia) 10/19/2016  . Seizure ([place]) 10/19/2016  . Fluid overload 10/14/2016  . 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 Cardiorenal syndrome with renal failure 09/28/2016  . Acute urinary retention 09/26/2016  . Acute on chronic combined systolic and diastolic CHF (congestive heart failure) ([place]) 09/26/2016  . Scrotal edema 09/24/2016  . Multiple open wounds of lower leg, initial encounter 09/24/2016  . Tinea of groin 09/24/2016  . Chronic kidney disease (CKD), stage III (moderate) ([place]) 06/07/2016  . Chronic combined systolic and diastolic congestive heart failure ([place])   . Protein calorie malnutrition ([place]) 07/12/2015  . Congestive dilated cardiomyopathy ([place]) 07/11/2015  . Pressure ulcer 07/09/2015  . Open knee wound 07/06/2015  . History of right below knee amputation ([place]) 02/22/2015  . PAD (peripheral artery disease) ([place]) 02/22/2015  . Blindness of left eye 01/04/2015  . Complications, amputation stump late ([place]) 08/06/2014  . Diabetic neuropathy associated with type 2 diabetes mellitus ([place]) 12/01/2013  . Noncompliance with medications 07/13/2010  . OTHER SPEC TYPES SCHIZOPHRENIA UNSPEC CONDITION 05/24/2009  . Obesity, unspecified 06/09/2007  . DM (diabetes mellitus), type 2, uncontrolled ([place]) 05/02/2006  . Essential hypertension, benign  05/02/2006    Current Outpatient Prescriptions:  .  aspirin 325 MG tablet, Take 1 tablet (325 mg total) by mouth daily., Disp: 30 tablet, Rfl: 0 .  atorvastatin (LIPITOR) 40 MG tablet, Take 1 tablet (40 mg total) by mouth daily., Disp: 30 tablet, Rfl: 0 .  bacitracin ointment, Apply topically 2 (two) times daily., Disp: 120 g, Rfl: 0 .  bisoprolol (ZEBETA) 5 MG tablet, Take 1 tablet (5 mg total) by mouth daily., Disp: 30 tablet, Rfl: 1 .  brimonidine (ALPHAGAN) 0.2 % ophthalmic solution, PLACE 1 DROP INTO THE LEFT EYE 2 (TWO) TIMES DAILY. (Patient taking differently: Place 1 drop into the left eye daily. ), Disp: 5 mL, Rfl: 0 .  dorzolamide-timolol (COSOPT) 22.3-6.8 MG/ML ophthalmic solution, Place 1 drop into the left eye 2 (two) times daily. (Patient taking differently: Place 1 drop into the left eye daily. ), Disp: 10 mL, Rfl: 12 .  furosemide (LASIX) 80 MG tablet, Take 80 mg (1 tabs) in am and 40 mg (1/2 tab) in pm (Patient taking differently: Take 80 mg by mouth daily. ), Disp: 45 tablet, Rfl: 6 .  insulin glargine (LANTUS) 100 UNIT/ML injection, Inject 10 Units into the skin at bedtime. , Disp: , Rfl:  .  levETIRAcetam (KEPPRA) 750 MG tablet, Take 1 tablet (750 mg total) by mouth 2 (two) times daily., Disp: 60 tablet, Rfl: 0 .  metFORMIN (GLUCOPHAGE) 500 MG tablet, Take 1 tablet (500 mg total) by mouth 2 (two) times daily with a meal., Disp: 60 tablet, Rfl: 6 .  metolazone (ZAROXOLYN) 2.5 MG tablet, Take 1 tablet (2.5 mg total) by mouth daily., Disp: 30 tablet, Rfl: 0 .  potassium chloride  SA (K-DUR,KLOR-CON) 20 MEQ tablet, Take 40 mEq by mouth daily. , Disp: , Rfl:  .  sacubitril-valsartan (ENTRESTO) 49-51 MG, Take 1 tablet by mouth 2 (two) times daily., Disp: 60 tablet, Rfl: 3 .  tamsulosin (FLOMAX) 0.4 MG CAPS capsule, Take 1 capsule (0.4 mg total) by mouth daily after supper., Disp: 30 capsule, Rfl: 0 .  vitamin A 10000 UNIT capsule, Take 10,000 Units by mouth daily., Disp: , Rfl:  No  Known Allergies   Social History   Social History  . Marital status: Married    Spouse name: N/A  . Number of children: N/A  . Years of education: N/A   Occupational History  . Not on file.   Social History Main Topics  . Smoking status: Former Smoker    Years: 20.00    Quit date: 05/10/2000  . Smokeless tobacco: Former Systems developer    Quit date: 02/22/2000  . Alcohol use No  . Drug use: No  . Sexual activity: Not on file   Other Topics Concern  . Not on file   Social History Narrative   ** Merged History Encounter **       Lives alone.     Physical Exam  Pulmonary/Chest: No respiratory distress. He has no wheezes.  Abdominal: He exhibits no distension.  Musculoskeletal: He exhibits edema.  Pt has edema in both legs  Skin: Skin is warm and dry. He is not diaphoretic.        Future Appointments Date Time Provider [place]  12/06/2016 1:45 PM Newt Minion, MD [facility] None  12/25/2016 2:40 PM Troy Sine, MD [facility]    ATF pt [name] x4 sitting in his wheelchair with no complaints.  Pts advance homecare nurse was walking in at the same time.  She was notified that pt has bedbugs therefore she left due to the companys policy.  Pt was left confused because he didn't know that he had bedbugs.  Pt hasn't taken any of his meds evening or morning doses.  Pt's vitals noted.  Pill box verified.   BP 130/84   Pulse 74   Resp 16   SpO2 98%   cbg 191  Pt is still not weighing himself.      Demetria Copper, EMT Paramedic 12/05/2016    ACTION: Home visit completed

## 2016-12-06 ENCOUNTER — Ambulatory Visit (INDEPENDENT_AMBULATORY_CARE_PROVIDER_SITE_OTHER): Payer: Medicaid Other | Admitting: Orthopedic Surgery

## 2016-12-06 ENCOUNTER — Encounter (INDEPENDENT_AMBULATORY_CARE_PROVIDER_SITE_OTHER): Payer: Self-pay | Admitting: Orthopedic Surgery

## 2016-12-06 DIAGNOSIS — I87332 Chronic venous hypertension (idiopathic) with ulcer and inflammation of left lower extremity: Secondary | ICD-10-CM | POA: Diagnosis not present

## 2016-12-06 DIAGNOSIS — T879 Unspecified complications of amputation stump: Secondary | ICD-10-CM | POA: Diagnosis not present

## 2016-12-06 DIAGNOSIS — T8743 Infection of amputation stump, right lower extremity: Secondary | ICD-10-CM | POA: Diagnosis not present

## 2016-12-06 DIAGNOSIS — L97929 Non-pressure chronic ulcer of unspecified part of left lower leg with unspecified severity: Secondary | ICD-10-CM | POA: Diagnosis not present

## 2016-12-06 NOTE — Telephone Encounter (Signed)
Faxed confirmation received on 438pm [phone]

## 2016-12-06 NOTE — Telephone Encounter (Signed)
Will give at patients appointment today.

## 2016-12-06 NOTE — Progress Notes (Signed)
Office Visit Note   Patient: [name]           Date of Birth: Aug 02, 1955           MRN: [id number] Visit Date: 12/06/2016              Requested by: Steve Rattler, DO [place], [place] [address] PCP: Steve Rattler, DO  Chief Complaint  Patient presents with  . Left Leg - Edema, Wound Check  . Right Knee - Wound Check      HPI: Patient is a 61 year old gentleman with multiple medical problems with venous ulceration left leg weightbearing ulceration on the right knee status post transtibial amputation and the patient crawling on the floor. Home health agency is discontinued coming to his home due to unsafe sanitary conditions.  Assessment & Plan: Visit Diagnoses:  1. Complications, amputation stump late ([place])   2. 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 We will fax a order for a semi-automatic hospital bed. The home health agency is just stopped coming to his home due to unsafe sanitary conditions.  Follow-Up Instructions: Return in about 1 week (around 12/13/2016).   Ortho Exam  Patient is alert, oriented, no adenopathy, well-dressed, normal affect, normal respiratory effort. Examination patient is essentially legally blind. He ambulates crawling through the house. He has massive venous stasis ulcers on the left leg with weeping edema. Examination of the right leg he has a large ulcer over the patella tibial tubercle region proximally 6 cm in diameter possibly 5 mm deep with healthy granulation tissue there is no cellulitis no odor no drainage no signs of tendon or joint. Examination the left leg he has weeping edema with multiple stasis insufficiency ulcers preserved about a millimeter deep and the ulcers are possibly 2 cm in diameter throughout the lower leg. There is no cellulitis no signs  of infection.  Imaging: No results found. No images are attached to the encounter.  Labs: Lab Results  Component Value Date   HGBA1C 7.2 (H) 10/14/2016   HGBA1C 7.9 (H) 09/24/2016   HGBA1C 10.1 (H) 05/24/2016   ESRSEDRATE 114 (H) 09/23/2013   CRP 37.3 (H) 09/23/2013   REPTSTATUS 10/19/2016 FINAL 10/14/2016   REPTSTATUS 10/19/2016 FINAL 10/14/2016   GRAMSTAIN  09/24/2013    MODERATE WBC PRESENT,BOTH PMN AND MONONUCLEAR MODERATE GRAM POSITIVE COCCI IN PAIRS FEW GRAM VARIABLE ROD Performed at [facility]  09/24/2013    MODERATE WBC PRESENT,BOTH PMN AND MONONUCLEAR ABUNDANT GRAM POSITIVE COCCI IN PAIRS FEW GRAM VARIABLE ROD Performed at [facility]   CULT NO GROWTH 5 DAYS 10/14/2016   CULT NO GROWTH 5 DAYS 10/14/2016    Orders:  No orders of the defined types were placed in this encounter.  No orders of the defined types were placed in this encounter.    Procedures: No procedures performed  Clinical Data: No additional findings.  ROS:  All other systems negative, except as noted in the HPI. Review of Systems  Objective: Vital Signs: There were no vitals taken for this visit.  Specialty Comments:  No specialty comments available.  PMFS History: Patient Active Problem List   Diagnosis Date Noted  . Embolic stroke ([place]) 51/76/1607  . Cardiomyopathy, dilated ([place]) 10/19/2016  . Acute on chronic systolic heart failure, NYHA class  3 ([place]) 10/19/2016  . Moderate to severe pulmonary hypertension ([place]) 10/19/2016  . Severe tricuspid regurgitation by prior echocardiogram 10/19/2016  . Diabetes mellitus type 2 in obese ([place]) 10/19/2016  . HLD (hyperlipidemia) 10/19/2016  . Seizure ([place]) 10/19/2016  . Fluid overload 10/14/2016  . 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 Cardiorenal syndrome with renal failure 09/28/2016  . Acute urinary retention 09/26/2016  . Acute  on chronic combined systolic and diastolic CHF (congestive heart failure) ([place]) 09/26/2016  . Scrotal edema 09/24/2016  . Multiple open wounds of lower leg, initial encounter 09/24/2016  . Tinea of groin 09/24/2016  . Chronic kidney disease (CKD), stage III (moderate) ([place]) 06/07/2016  . Chronic combined systolic and diastolic congestive heart failure ([place])   . Protein calorie malnutrition ([place]) 07/12/2015  . Congestive dilated cardiomyopathy ([place]) 07/11/2015  . Pressure ulcer 07/09/2015  . Open knee wound 07/06/2015  . History of right below knee amputation ([place]) 02/22/2015  . PAD (peripheral artery disease) ([place]) 02/22/2015  . Blindness of left eye 01/04/2015  . Complications, amputation stump late ([place]) 08/06/2014  . Diabetic neuropathy associated with type 2 diabetes mellitus ([place]) 12/01/2013  . Noncompliance with medications 07/13/2010  . OTHER SPEC TYPES SCHIZOPHRENIA UNSPEC CONDITION 05/24/2009  . Obesity, unspecified 06/09/2007  . DM (diabetes mellitus), type 2, uncontrolled ([place]) 05/02/2006  . Essential hypertension, benign 05/02/2006   Past Medical History:  Diagnosis Date  . Acute on chronic systolic heart failure, NYHA class 3 ([place])   . AKI (acute kidney injury) ([place])   . Anemia   . BKA stump complication ([place])   . Blind left eye   . Blind left eye   . Cardiomyopathy, dilated ([place])   . CHF (congestive heart failure) ([place])   . Combined systolic and diastolic congestive heart failure ([place])   . Congestive dilated cardiomyopathy ([place]) 07/11/2015  . Diabetes mellitus   . Diabetic neuropathy associated with type 2 diabetes mellitus ([place])   . Hypertension   . Neuropathy   . Noncompliance with medications   . Open knee wound 10/2015   on rt bka  . PAD (peripheral artery disease) ([place])   . Protein calorie malnutrition ([place])   . Shortness of breath dyspnea     Family History  Problem Relation Age of Onset  . Cancer Mother   . Peripheral vascular disease Father       Past Surgical History:  Procedure Laterality Date  . AMPUTATION Right 09/26/2013   Procedure: AMPUTATION BELOW KNEE;  Surgeon: Rozanna Box, MD;  Location: [facility];  Service: Orthopedics;  Laterality: Right;  . AMPUTATION Right 05/01/2015   Procedure: REVISION OF RIGHT TRANSTIBIAL AMPUTATION ;  Surgeon: Newt Minion, MD;  Location: [facility];  Service: Orthopedics;  Laterality: Right;  . APPLICATION OF WOUND VAC  08/10/2016   Procedure: APPLICATION OFI NCISONAL  WOUND VAC;  Surgeon: Newt Minion, MD;  Location: [facility];  Service: Orthopedics;;  . CARDIAC CATHETERIZATION N/A 07/13/2015   Procedure: Right/Left Heart Cath and Coronary Angiography;  Surgeon: Jolaine Artist, MD;  Location: [facility];  Service: Cardiovascular;  Laterality: N/A;  . COLONOSCOPY    . I&D EXTREMITY Right 09/24/2013   Procedure: IRRIGATION AND DEBRIDEMENT RIGHT FOOT ULCER;  Surgeon: Renette Butters, MD;  Location: [facility];  Service: Orthopedics;  Laterality: Right;  . I&D EXTREMITY Right 09/26/2013   Procedure: Repeat IRRIGATION AND DEBRIDEMENT Right Foot Ulcer;  Surgeon:  Rozanna Box, MD;  Location: [facility];  Service: Orthopedics;  Laterality: Right;  . MULTIPLE TOOTH EXTRACTIONS    . SKIN SPLIT GRAFT Right 08/10/2016   Procedure: SKIN GRAFT SPLIT THICKNESS RIGHT LEG;  Surgeon: Newt Minion, MD;  Location: [facility];  Service: Orthopedics;  Laterality: Right;  . STUMP REVISION Right 04/20/2015   Procedure: Revision Right Below Knee Amputation, Apply Wound VAC;  Surgeon: Newt Minion, MD;  Location: [facility];  Service: Orthopedics;  Laterality: Right;  . TONSILLECTOMY     Social History   Occupational History  . Not on file.   Social History Main Topics  . Smoking status: Former Smoker    Years: 20.00    Quit date: 05/10/2000  . Smokeless tobacco: Former Systems developer    Quit date: 02/22/2000  . Alcohol use No  . Drug use: No  . Sexual activity: Not on file

## 2016-12-06 NOTE — Telephone Encounter (Signed)
Will also fax for patient today.

## 2016-12-12 ENCOUNTER — Other Ambulatory Visit (HOSPITAL_COMMUNITY): Payer: Self-pay

## 2016-12-12 ENCOUNTER — Ambulatory Visit (INDEPENDENT_AMBULATORY_CARE_PROVIDER_SITE_OTHER): Payer: Medicaid Other | Admitting: Orthopedic Surgery

## 2016-12-12 NOTE — Progress Notes (Signed)
;Paramedicine Encounter    Patient ID: [name], male    DOB: 1955-08-04, 61 y.o.   MRN: [id number]    Patient Care Team: Steve Rattler, DO as PCP - General  Patient Active Problem List   Diagnosis Date Noted  . Embolic stroke ([place]) 09/02/1599  . Cardiomyopathy, dilated ([place]) 10/19/2016  . Acute on chronic systolic heart failure, NYHA class 3 ([place]) 10/19/2016  . Moderate to severe pulmonary hypertension ([place]) 10/19/2016  . Severe tricuspid regurgitation by prior echocardiogram 10/19/2016  . Diabetes mellitus type 2 in obese ([place]) 10/19/2016  . HLD (hyperlipidemia) 10/19/2016  . Seizure ([place]) 10/19/2016  . Fluid overload 10/14/2016  . 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 Cardiorenal syndrome with renal failure 09/28/2016  . Acute urinary retention 09/26/2016  . Acute on chronic combined systolic and diastolic CHF (congestive heart failure) ([place]) 09/26/2016  . Scrotal edema 09/24/2016  . Multiple open wounds of lower leg, initial encounter 09/24/2016  . Tinea of groin 09/24/2016  . Chronic kidney disease (CKD), stage III (moderate) ([place]) 06/07/2016  . Chronic combined systolic and diastolic congestive heart failure ([place])   . Protein calorie malnutrition ([place]) 07/12/2015  . Congestive dilated cardiomyopathy ([place]) 07/11/2015  . Pressure ulcer 07/09/2015  . Open knee wound 07/06/2015  . History of right below knee amputation ([place]) 02/22/2015  . PAD (peripheral artery disease) ([place]) 02/22/2015  . Blindness of left eye 01/04/2015  . Complications, amputation stump late ([place]) 08/06/2014  . Diabetic neuropathy associated with type 2 diabetes mellitus ([place]) 12/01/2013  . Noncompliance with medications 07/13/2010  . OTHER SPEC TYPES SCHIZOPHRENIA UNSPEC CONDITION 05/24/2009  . Obesity, unspecified 06/09/2007  . DM (diabetes mellitus), type 2, uncontrolled ([place]) 05/02/2006  . Essential hypertension,  benign 05/02/2006    Current Outpatient Prescriptions:  .  aspirin 325 MG tablet, Take 1 tablet (325 mg total) by mouth daily., Disp: 30 tablet, Rfl: 0 .  atorvastatin (LIPITOR) 40 MG tablet, Take 1 tablet (40 mg total) by mouth daily., Disp: 30 tablet, Rfl: 0 .  bacitracin ointment, Apply topically 2 (two) times daily., Disp: 120 g, Rfl: 0 .  bisoprolol (ZEBETA) 5 MG tablet, Take 1 tablet (5 mg total) by mouth daily., Disp: 30 tablet, Rfl: 1 .  brimonidine (ALPHAGAN) 0.2 % ophthalmic solution, PLACE 1 DROP INTO THE LEFT EYE 2 (TWO) TIMES DAILY. (Patient taking differently: Place 1 drop into the left eye daily. ), Disp: 5 mL, Rfl: 0 .  dorzolamide-timolol (COSOPT) 22.3-6.8 MG/ML ophthalmic solution, Place 1 drop into the left eye 2 (two) times daily. (Patient taking differently: Place 1 drop into the left eye daily. ), Disp: 10 mL, Rfl: 12 .  furosemide (LASIX) 80 MG tablet, Take 80 mg (1 tabs) in am and 40 mg (1/2 tab) in pm (Patient taking differently: Take 80 mg by mouth daily. ), Disp: 45 tablet, Rfl: 6 .  insulin glargine (LANTUS) 100 UNIT/ML injection, Inject 10 Units into the skin at bedtime. , Disp: , Rfl:  .  levETIRAcetam (KEPPRA) 750 MG tablet, Take 1 tablet (750 mg total) by mouth 2 (two) times daily., Disp: 60 tablet, Rfl: 0 .  metFORMIN (GLUCOPHAGE) 500 MG tablet, Take 1 tablet (500 mg total) by mouth 2 (two) times daily with a meal., Disp: 60 tablet, Rfl: 6 .  metolazone (ZAROXOLYN) 2.5 MG tablet, Take 1 tablet (2.5 mg total) by mouth daily., Disp: 30 tablet, Rfl: 0 .  potassium chloride  SA (K-DUR,KLOR-CON) 20 MEQ tablet, Take 40 mEq by mouth daily. , Disp: , Rfl:  .  sacubitril-valsartan (ENTRESTO) 49-51 MG, Take 1 tablet by mouth 2 (two) times daily., Disp: 60 tablet, Rfl: 3 .  tamsulosin (FLOMAX) 0.4 MG CAPS capsule, Take 1 capsule (0.4 mg total) by mouth daily after supper., Disp: 30 capsule, Rfl: 0 .  vitamin A 10000 UNIT capsule, Take 10,000 Units by mouth daily., Disp: , Rfl:   No Known Allergies   Social History   Social History  . Marital status: Married    Spouse name: N/A  . Number of children: N/A  . Years of education: N/A   Occupational History  . Not on file.   Social History Main Topics  . Smoking status: Former Smoker    Years: 20.00    Quit date: 05/10/2000  . Smokeless tobacco: Former Systems developer    Quit date: 02/22/2000  . Alcohol use No  . Drug use: No  . Sexual activity: Not on file   Other Topics Concern  . Not on file   Social History Narrative   ** Merged History Encounter **       Lives alone.     Physical Exam  Pulmonary/Chest: No respiratory distress. He has no wheezes. He has no rales.  Abdominal: He exhibits distension. He exhibits no mass. There is no tenderness. There is no guarding.  Genitourinary:  Genitourinary Comments: pts groin area swollen   Musculoskeletal: He exhibits edema.  Edema from pt's feet up to his thighs  Skin: Skin is warm and dry. He is not diaphoretic.        Future Appointments Date Time Provider [place]  12/14/2016 9:20 AM Steve Rattler, DO [facility] [place] [facility]  12/20/2016 1:00 PM Newt Minion, MD [facility] None  12/25/2016 2:40 PM Troy Sine, MD [facility]    ATF pt [name] x4 sitting in his wheelchair.  His apartment was extremely warm (90 degrees), pt stated that he was comfortable.  He denies fever and chills.  Pt has edema from his feet up his thighs and his groin area is also swollen.  Pt denies sob, dizziness, lightheadedness or chest pain. Pt hasn't taken but two medication doses within this week.  Pt stated that an inspector came to his apartment yesterday and he was told that he has roaches and not bed bugs.  I advised him that I will ask housing authority if he could get in witting the results to [facility] to [facility].  Pill box verified.    BP (!) 150/92   Pulse 72   Resp 16   SpO2 97%   Pt still doesn't weigh due to the increase edema in his legs, he can  not stand without his prostatic leg.      Demetria Copper, EMT Paramedic 12/12/2016    ACTION: Home visit completed

## 2016-12-14 ENCOUNTER — Ambulatory Visit: Payer: Medicaid Other | Admitting: Family Medicine

## 2016-12-20 ENCOUNTER — Inpatient Hospital Stay (HOSPITAL_COMMUNITY)
Admission: EM | Admit: 2016-12-20 | Discharge: 2016-12-31 | DRG: 291 | Disposition: A | Discharge status: Skilled Nursing Facility | Payer: Medicaid Other | Attending: Family Medicine | Admitting: Family Medicine

## 2016-12-20 ENCOUNTER — Emergency Department (HOSPITAL_COMMUNITY): Payer: Medicaid Other

## 2016-12-20 ENCOUNTER — Other Ambulatory Visit (HOSPITAL_COMMUNITY): Payer: Self-pay

## 2016-12-20 ENCOUNTER — Encounter (INDEPENDENT_AMBULATORY_CARE_PROVIDER_SITE_OTHER): Payer: Self-pay

## 2016-12-20 ENCOUNTER — Telehealth (HOSPITAL_COMMUNITY): Payer: Self-pay | Admitting: Cardiology

## 2016-12-20 ENCOUNTER — Encounter (HOSPITAL_COMMUNITY): Payer: Self-pay

## 2016-12-20 ENCOUNTER — Ambulatory Visit (INDEPENDENT_AMBULATORY_CARE_PROVIDER_SITE_OTHER): Payer: Medicaid Other | Admitting: Orthopedic Surgery

## 2016-12-20 DIAGNOSIS — Z8673 Personal history of transient ischemic attack (TIA), and cerebral infarction without residual deficits: Secondary | ICD-10-CM | POA: Diagnosis not present

## 2016-12-20 DIAGNOSIS — N183 Chronic kidney disease, stage 3 (moderate): Secondary | ICD-10-CM | POA: Diagnosis present

## 2016-12-20 DIAGNOSIS — I130 Hypertensive heart and chronic kidney disease with heart failure and stage 1 through stage 4 chronic kidney disease, or unspecified chronic kidney disease: Principal | ICD-10-CM | POA: Diagnosis present

## 2016-12-20 DIAGNOSIS — H5462 Unqualified visual loss, left eye, normal vision right eye: Secondary | ICD-10-CM | POA: Diagnosis present

## 2016-12-20 DIAGNOSIS — L89899 Pressure ulcer of other site, unspecified stage: Secondary | ICD-10-CM | POA: Diagnosis not present

## 2016-12-20 DIAGNOSIS — R338 Other retention of urine: Secondary | ICD-10-CM | POA: Diagnosis present

## 2016-12-20 DIAGNOSIS — L97929 Non-pressure chronic ulcer of unspecified part of left lower leg with unspecified severity: Secondary | ICD-10-CM | POA: Diagnosis present

## 2016-12-20 DIAGNOSIS — H35341 Macular cyst, hole, or pseudohole, right eye: Secondary | ICD-10-CM | POA: Diagnosis present

## 2016-12-20 DIAGNOSIS — Z79899 Other long term (current) drug therapy: Secondary | ICD-10-CM

## 2016-12-20 DIAGNOSIS — I272 Pulmonary hypertension, unspecified: Secondary | ICD-10-CM | POA: Diagnosis present

## 2016-12-20 DIAGNOSIS — E8770 Fluid overload, unspecified: Secondary | ICD-10-CM | POA: Diagnosis present

## 2016-12-20 DIAGNOSIS — I5043 Acute on chronic combined systolic (congestive) and diastolic (congestive) heart failure: Secondary | ICD-10-CM | POA: Diagnosis present

## 2016-12-20 DIAGNOSIS — E11319 Type 2 diabetes mellitus with unspecified diabetic retinopathy without macular edema: Secondary | ICD-10-CM | POA: Diagnosis present

## 2016-12-20 DIAGNOSIS — E46 Unspecified protein-calorie malnutrition: Secondary | ICD-10-CM | POA: Diagnosis present

## 2016-12-20 DIAGNOSIS — I248 Other forms of acute ischemic heart disease: Secondary | ICD-10-CM | POA: Diagnosis present

## 2016-12-20 DIAGNOSIS — R609 Edema, unspecified: Secondary | ICD-10-CM

## 2016-12-20 DIAGNOSIS — L97919 Non-pressure chronic ulcer of unspecified part of right lower leg with unspecified severity: Secondary | ICD-10-CM | POA: Diagnosis present

## 2016-12-20 DIAGNOSIS — Z89511 Acquired absence of right leg below knee: Secondary | ICD-10-CM | POA: Diagnosis not present

## 2016-12-20 DIAGNOSIS — E1140 Type 2 diabetes mellitus with diabetic neuropathy, unspecified: Secondary | ICD-10-CM | POA: Diagnosis present

## 2016-12-20 DIAGNOSIS — Z7982 Long term (current) use of aspirin: Secondary | ICD-10-CM

## 2016-12-20 DIAGNOSIS — Z9114 Patient's other noncompliance with medication regimen: Secondary | ICD-10-CM | POA: Diagnosis not present

## 2016-12-20 DIAGNOSIS — E1136 Type 2 diabetes mellitus with diabetic cataract: Secondary | ICD-10-CM | POA: Diagnosis present

## 2016-12-20 DIAGNOSIS — I878 Other specified disorders of veins: Secondary | ICD-10-CM | POA: Diagnosis present

## 2016-12-20 DIAGNOSIS — G40909 Epilepsy, unspecified, not intractable, without status epilepticus: Secondary | ICD-10-CM | POA: Diagnosis present

## 2016-12-20 DIAGNOSIS — N401 Enlarged prostate with lower urinary tract symptoms: Secondary | ICD-10-CM

## 2016-12-20 DIAGNOSIS — E1151 Type 2 diabetes mellitus with diabetic peripheral angiopathy without gangrene: Secondary | ICD-10-CM | POA: Diagnosis present

## 2016-12-20 DIAGNOSIS — I5023 Acute on chronic systolic (congestive) heart failure: Secondary | ICD-10-CM | POA: Diagnosis not present

## 2016-12-20 DIAGNOSIS — R3914 Feeling of incomplete bladder emptying: Secondary | ICD-10-CM | POA: Diagnosis not present

## 2016-12-20 DIAGNOSIS — Z794 Long term (current) use of insulin: Secondary | ICD-10-CM

## 2016-12-20 DIAGNOSIS — I509 Heart failure, unspecified: Secondary | ICD-10-CM

## 2016-12-20 DIAGNOSIS — E876 Hypokalemia: Secondary | ICD-10-CM | POA: Diagnosis not present

## 2016-12-20 DIAGNOSIS — Z87891 Personal history of nicotine dependence: Secondary | ICD-10-CM

## 2016-12-20 DIAGNOSIS — I420 Dilated cardiomyopathy: Secondary | ICD-10-CM | POA: Diagnosis present

## 2016-12-20 DIAGNOSIS — E11622 Type 2 diabetes mellitus with other skin ulcer: Secondary | ICD-10-CM | POA: Diagnosis present

## 2016-12-20 DIAGNOSIS — I10 Essential (primary) hypertension: Secondary | ICD-10-CM

## 2016-12-20 DIAGNOSIS — E1122 Type 2 diabetes mellitus with diabetic chronic kidney disease: Secondary | ICD-10-CM | POA: Diagnosis present

## 2016-12-20 DIAGNOSIS — E785 Hyperlipidemia, unspecified: Secondary | ICD-10-CM | POA: Diagnosis present

## 2016-12-20 DIAGNOSIS — L8990 Pressure ulcer of unspecified site, unspecified stage: Secondary | ICD-10-CM | POA: Insufficient documentation

## 2016-12-20 DIAGNOSIS — I071 Rheumatic tricuspid insufficiency: Secondary | ICD-10-CM | POA: Diagnosis present

## 2016-12-20 DIAGNOSIS — Z6826 Body mass index (BMI) 26.0-26.9, adult: Secondary | ICD-10-CM

## 2016-12-20 LAB — CBC WITH DIFFERENTIAL/PLATELET
Basophils Absolute: 0 10*3/uL (ref 0.0–0.1)
Basophils Relative: 0 %
Eosinophils Absolute: 0.1 10*3/uL (ref 0.0–0.7)
Eosinophils Relative: 1 %
HCT: 43.2 % (ref 39.0–52.0)
Hemoglobin: 14.6 g/dL (ref 13.0–17.0)
Lymphocytes Relative: 7 %
Lymphs Abs: 0.5 10*3/uL — ABNORMAL LOW (ref 0.7–4.0)
MCH: 27.1 pg (ref 26.0–34.0)
MCHC: 33.8 g/dL (ref 30.0–36.0)
MCV: 80.1 fL (ref 78.0–100.0)
Monocytes Absolute: 0.8 10*3/uL (ref 0.1–1.0)
Monocytes Relative: 14 %
Neutro Abs: 4.8 10*3/uL (ref 1.7–7.7)
Neutrophils Relative %: 78 %
Platelets: 231 10*3/uL (ref 150–400)
RBC: 5.39 MIL/uL (ref 4.22–5.81)
RDW: 18.6 % — ABNORMAL HIGH (ref 11.5–15.5)
WBC: 6.1 10*3/uL (ref 4.0–10.5)

## 2016-12-20 LAB — COMPREHENSIVE METABOLIC PANEL
ALT: 26 U/L (ref 17–63)
AST: 39 U/L (ref 15–41)
Albumin: 2.4 g/dL — ABNORMAL LOW (ref 3.5–5.0)
Alkaline Phosphatase: 189 U/L — ABNORMAL HIGH (ref 38–126)
Anion gap: 10 (ref 5–15)
BUN: 24 mg/dL — ABNORMAL HIGH (ref 6–20)
CO2: 22 mmol/L (ref 22–32)
Calcium: 8.7 mg/dL — ABNORMAL LOW (ref 8.9–10.3)
Chloride: 105 mmol/L (ref 101–111)
Creatinine, Ser: 1.14 mg/dL (ref 0.61–1.24)
GFR calc Af Amer: >60 mL/min (ref >60)
GFR calc non Af Amer: >60 mL/min (ref >60)
Glucose, Bld: 86 mg/dL (ref 65–99)
Potassium: 4.5 mmol/L (ref 3.5–5.1)
Sodium: 137 mmol/L (ref 135–145)
Total Bilirubin: 1.9 mg/dL — ABNORMAL HIGH (ref 0.3–1.2)
Total Protein: 6 g/dL — ABNORMAL LOW (ref 6.5–8.1)

## 2016-12-20 LAB — GLUCOSE, CAPILLARY: Glucose-Capillary: 129 mg/dL — ABNORMAL HIGH (ref 65–99)

## 2016-12-20 LAB — TROPONIN I
Troponin I: 0.05 ng/mL (ref <0.03)
Troponin I: 0.05 ng/mL (ref <0.03)

## 2016-12-20 LAB — BRAIN NATRIURETIC PEPTIDE: B Natriuretic Peptide: 3536.9 pg/mL — ABNORMAL HIGH (ref 0.0–100.0)

## 2016-12-20 MED ORDER — INSULIN ASPART 100 UNIT/ML ~~LOC~~ SOLN
0.0000 [IU] | Freq: Three times a day (TID) | SUBCUTANEOUS | Status: DC
  Administered 2016-12-24: 1 [IU] via SUBCUTANEOUS
  Administered 2016-12-26: 2 [IU] via SUBCUTANEOUS
  Administered 2016-12-27: 1 [IU] via SUBCUTANEOUS
  Administered 2016-12-27: 2 [IU] via SUBCUTANEOUS
  Administered 2016-12-28: 1 [IU] via SUBCUTANEOUS
  Administered 2016-12-30: 2 [IU] via SUBCUTANEOUS
  Administered 2016-12-30: 1 [IU] via SUBCUTANEOUS

## 2016-12-20 MED ORDER — BISOPROLOL FUMARATE 5 MG PO TABS
5.0000 mg | ORAL_TABLET | Freq: Every day | ORAL | Status: DC
  Administered 2016-12-21 – 2016-12-31 (×11): 5 mg via ORAL
  Filled 2016-12-20 (×11): qty 1

## 2016-12-20 MED ORDER — SACUBITRIL-VALSARTAN 49-51 MG PO TABS
1.0000 | ORAL_TABLET | Freq: Two times a day (BID) | ORAL | Status: DC
  Administered 2016-12-20 – 2016-12-31 (×22): 1 via ORAL
  Filled 2016-12-20 (×23): qty 1

## 2016-12-20 MED ORDER — INSULIN GLARGINE 100 UNIT/ML ~~LOC~~ SOLN
5.0000 [IU] | Freq: Every day | SUBCUTANEOUS | Status: DC
  Administered 2016-12-20 – 2016-12-24 (×4): 5 [IU] via SUBCUTANEOUS
  Filled 2016-12-20 (×5): qty 0.05

## 2016-12-20 MED ORDER — ENOXAPARIN SODIUM 40 MG/0.4ML ~~LOC~~ SOLN
40.0000 mg | SUBCUTANEOUS | Status: DC
  Administered 2016-12-20 – 2016-12-30 (×11): 40 mg via SUBCUTANEOUS
  Filled 2016-12-20 (×11): qty 0.4

## 2016-12-20 MED ORDER — FUROSEMIDE 10 MG/ML IJ SOLN
80.0000 mg | Freq: Once | INTRAMUSCULAR | Status: AC
  Administered 2016-12-20: 80 mg via INTRAVENOUS
  Filled 2016-12-20: qty 8

## 2016-12-20 MED ORDER — DORZOLAMIDE HCL-TIMOLOL MAL 2-0.5 % OP SOLN: 1.0000 [drp] | Freq: Every day | OPHTHALMIC | Status: DC

## 2016-12-20 MED ORDER — ONDANSETRON HCL 4 MG/2ML IJ SOLN
4.0000 mg | Freq: Four times a day (QID) | INTRAMUSCULAR | Status: DC | PRN
  Administered 2016-12-23: 4 mg via INTRAVENOUS
  Filled 2016-12-20: qty 2

## 2016-12-20 MED ORDER — TAMSULOSIN HCL 0.4 MG PO CAPS
0.4000 mg | ORAL_CAPSULE | Freq: Every day | ORAL | Status: DC
  Administered 2016-12-21: 0.4 mg via ORAL
  Filled 2016-12-20: qty 1

## 2016-12-20 MED ORDER — ATORVASTATIN CALCIUM 40 MG PO TABS
40.0000 mg | ORAL_TABLET | Freq: Every day | ORAL | Status: DC
  Administered 2016-12-21 – 2016-12-30 (×10): 40 mg via ORAL
  Filled 2016-12-20 (×10): qty 1

## 2016-12-20 MED ORDER — FUROSEMIDE 10 MG/ML IJ SOLN
80.0000 mg | Freq: Every day | INTRAMUSCULAR | Status: DC
  Filled 2016-12-20: qty 8

## 2016-12-20 MED ORDER — ACETAMINOPHEN 325 MG PO TABS
650.0000 mg | ORAL_TABLET | ORAL | Status: DC | PRN
  Administered 2016-12-24 – 2016-12-25 (×2): 650 mg via ORAL
  Filled 2016-12-20 (×2): qty 2

## 2016-12-20 MED ORDER — SODIUM CHLORIDE 0.9% FLUSH: 3.0000 mL | INTRAVENOUS | Status: DC | PRN

## 2016-12-20 MED ORDER — SODIUM CHLORIDE 0.9 % IV SOLN: 250.0000 mL | INTRAVENOUS | Status: DC | PRN

## 2016-12-20 MED ORDER — LEVETIRACETAM 750 MG PO TABS
750.0000 mg | ORAL_TABLET | Freq: Two times a day (BID) | ORAL | Status: DC
  Administered 2016-12-20 – 2016-12-31 (×22): 750 mg via ORAL
  Filled 2016-12-20 (×23): qty 1

## 2016-12-20 MED ORDER — SODIUM CHLORIDE 0.9% FLUSH
3.0000 mL | Freq: Two times a day (BID) | INTRAVENOUS | Status: DC
  Administered 2016-12-20 – 2016-12-31 (×22): 3 mL via INTRAVENOUS

## 2016-12-20 NOTE — ED Provider Notes (Signed)
[facility] EMERGENCY DEPARTMENT Provider Note   CSN: [id number] Arrival date & time: 12/20/16  1532     History   Chief Complaint Chief Complaint  Patient presents with  . Congestive Heart Failure    HPI [name] is a 61 y.o. male.  HPI  61 year old male with a history of systolic CHF, diabetes, and noncompliance presents with swelling of his entire body. He has not taken any of his medicines for about 2 weeks. He states he feels he doesn't need them anymore and wants to see how he does without them. He has a paramedic that comes to his house to set up his meds about once a week. She noticed today that he was diffusely swollen and advised him to call to the ER. The patient states he does not feel bad and denies any headache, chest pain, shortness of breath, cough, or pain. He has noticed that his testicles have had some discomfort and have been swollen for about 2 weeks. However he is urinating normally. He has chronic wounds in his lower tremors but states they have not been painful. No fevers.  Past Medical History:  Diagnosis Date  . Acute on chronic systolic heart failure, NYHA class 3 ([place])   . AKI (acute kidney injury) ([place])   . Anemia   . BKA stump complication ([place])   . Blind left eye   . Blind left eye   . Cardiomyopathy, dilated ([place])   . CHF (congestive heart failure) ([place])   . Combined systolic and diastolic congestive heart failure ([place])   . Congestive dilated cardiomyopathy ([place]) 07/11/2015  . Diabetes mellitus   . Diabetic neuropathy associated with type 2 diabetes mellitus ([place])   . Hypertension   . Neuropathy   . Noncompliance with medications   . Open knee wound 10/2015   on rt bka  . PAD (peripheral artery disease) ([place])   . Protein calorie malnutrition ([place])   . Shortness of breath dyspnea     Patient Active Problem List   Diagnosis Date Noted  . CHF exacerbation ([place]) 12/20/2016  . Embolic stroke ([place]) 27/08/2374  .  Cardiomyopathy, dilated ([place]) 10/19/2016  . Acute on chronic systolic heart failure, NYHA class 3 ([place]) 10/19/2016  . Moderate to severe pulmonary hypertension ([place]) 10/19/2016  . Severe tricuspid regurgitation by prior echocardiogram 10/19/2016  . Diabetes mellitus type 2 in obese ([place]) 10/19/2016  . HLD (hyperlipidemia) 10/19/2016  . Seizure ([place]) 10/19/2016  . Fluid overload 10/14/2016  . 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 Cardiorenal syndrome with renal failure 09/28/2016  . Acute urinary retention 09/26/2016  . Acute on chronic combined systolic and diastolic CHF (congestive heart failure) ([place]) 09/26/2016  . Scrotal edema 09/24/2016  . Multiple open wounds of lower leg, initial encounter 09/24/2016  . Tinea of groin 09/24/2016  . Chronic kidney disease (CKD), stage III (moderate) ([place]) 06/07/2016  . Chronic combined systolic and diastolic congestive heart failure ([place])   . Protein calorie malnutrition ([place]) 07/12/2015  . Congestive dilated cardiomyopathy ([place]) 07/11/2015  . Pressure ulcer 07/09/2015  . Open knee wound 07/06/2015  . History of right below knee amputation ([place]) 02/22/2015  . PAD (peripheral artery disease) ([place]) 02/22/2015  . Blindness of left eye 01/04/2015  . Complications, amputation stump late ([place]) 08/06/2014  . Diabetic neuropathy associated with type 2 diabetes mellitus ([place]) 12/01/2013  . Noncompliance with medications 07/13/2010  . OTHER SPEC  TYPES SCHIZOPHRENIA UNSPEC CONDITION 05/24/2009  . Obesity, unspecified 06/09/2007  . DM (diabetes mellitus), type 2, uncontrolled ([place]) 05/02/2006  . Essential hypertension, benign 05/02/2006    Past Surgical History:  Procedure Laterality Date  . AMPUTATION Right 09/26/2013   Procedure: AMPUTATION BELOW KNEE;  Surgeon: Rozanna Box, MD;  Location: [facility];  Service: Orthopedics;  Laterality: Right;  . AMPUTATION Right 05/01/2015    Procedure: REVISION OF RIGHT TRANSTIBIAL AMPUTATION ;  Surgeon: Newt Minion, MD;  Location: [facility];  Service: Orthopedics;  Laterality: Right;  . APPLICATION OF WOUND VAC  08/10/2016   Procedure: APPLICATION OFI NCISONAL  WOUND VAC;  Surgeon: Newt Minion, MD;  Location: [facility];  Service: Orthopedics;;  . CARDIAC CATHETERIZATION N/A 07/13/2015   Procedure: Right/Left Heart Cath and Coronary Angiography;  Surgeon: Jolaine Artist, MD;  Location: [facility] CV [facility];  Service: Cardiovascular;  Laterality: N/A;  . COLONOSCOPY    . I&D EXTREMITY Right 09/24/2013   Procedure: IRRIGATION AND DEBRIDEMENT RIGHT FOOT ULCER;  Surgeon: Renette Butters, MD;  Location: [facility];  Service: Orthopedics;  Laterality: Right;  . I&D EXTREMITY Right 09/26/2013   Procedure: Repeat IRRIGATION AND DEBRIDEMENT Right Foot Ulcer;  Surgeon: Rozanna Box, MD;  Location: [facility];  Service: Orthopedics;  Laterality: Right;  . MULTIPLE TOOTH EXTRACTIONS    . SKIN SPLIT GRAFT Right 08/10/2016   Procedure: SKIN GRAFT SPLIT THICKNESS RIGHT LEG;  Surgeon: Newt Minion, MD;  Location: [facility];  Service: Orthopedics;  Laterality: Right;  . STUMP REVISION Right 04/20/2015   Procedure: Revision Right Below Knee Amputation, Apply Wound VAC;  Surgeon: Newt Minion, MD;  Location: [facility];  Service: Orthopedics;  Laterality: Right;  . TONSILLECTOMY         Home Medications    Prior to Admission medications   Medication Sig Start Date End Date Taking? Authorizing Provider  aspirin 325 MG tablet Take 1 tablet (325 mg total) by mouth daily. 10/21/16  Yes Sheikh, Omair Latif, DO  atorvastatin (LIPITOR) 40 MG tablet Take 1 tablet (40 mg total) by mouth daily. 10/19/16  Yes Hosie Poisson, MD  bacitracin ointment Apply topically 2 (two) times daily. 10/18/16  Yes Hosie Poisson, MD  bisoprolol (ZEBETA) 5 MG tablet Take 1 tablet (5 mg total) by mouth daily. 10/19/16  Yes Hosie Poisson, MD  furosemide (LASIX) 80 MG tablet Take 80 mg (1 tabs) in am  and 40 mg (1/2 tab) in pm Patient taking differently: Take 80 mg by mouth daily.  05/24/16  Yes Shirley Friar, PA-C  insulin glargine (LANTUS) 100 UNIT/ML injection Inject 10 Units into the skin at bedtime.    Yes [provider]  levETIRAcetam (KEPPRA) 750 MG tablet Take 1 tablet (750 mg total) by mouth 2 (two) times daily. 10/20/16  Yes Sheikh, Omair Latif, DO  metFORMIN (GLUCOPHAGE) 500 MG tablet Take 1 tablet (500 mg total) by mouth 2 (two) times daily with a meal. 10/10/15  Yes Clegg, Amy D, NP  metolazone (ZAROXOLYN) 2.5 MG tablet Take 1 tablet (2.5 mg total) by mouth daily. 10/19/16  Yes Hosie Poisson, MD  potassium chloride SA (K-DUR,KLOR-CON) 20 MEQ tablet Take 40 mEq by mouth daily.    Yes [provider]  sacubitril-valsartan (ENTRESTO) 49-51 MG Take 1 tablet by mouth 2 (two) times daily. 08/23/16  Yes Arbutus Leas, NP  tamsulosin (FLOMAX) 0.4 MG CAPS capsule Take 1 capsule (0.4 mg total) by mouth daily after supper.  09/30/16  Yes Patrecia Pour, MD  vitamin A 10000 UNIT capsule Take 10,000 Units by mouth daily.   Yes [provider]  brimonidine (ALPHAGAN) 0.2 % ophthalmic solution PLACE 1 DROP INTO THE LEFT EYE 2 (TWO) TIMES DAILY. Patient not taking: Reported on 12/20/2016 06/15/15   Mariel Aloe, MD  dorzolamide-timolol (COSOPT) 22.3-6.8 MG/ML ophthalmic solution Place 1 drop into the left eye 2 (two) times daily. Patient not taking: Reported on 12/20/2016 09/28/13   Virginia Crews, MD    Family History Family History  Problem Relation Age of Onset  . Cancer Mother   . Peripheral vascular disease Father     Social History Social History  Substance Use Topics  . Smoking status: Former Smoker    Years: 20.00    Quit date: 05/10/2000  . Smokeless tobacco: Former Systems developer    Quit date: 02/22/2000  . Alcohol use No     Allergies   Patient has no known allergies.   Review of Systems Review of Systems  Constitutional: Negative for fever.   Respiratory: Negative for shortness of breath.   Cardiovascular: Positive for leg swelling. Negative for chest pain.  Gastrointestinal: Negative for abdominal pain and vomiting.  Genitourinary: Positive for scrotal swelling. Negative for dysuria.  All other systems reviewed and are negative.    Physical Exam Updated Vital Signs BP (!) 135/105 (BP Location: Right Arm)   Pulse 74   Temp 98.4 F (36.9 C) (Oral)   Resp 13   SpO2 96%   Physical Exam  Constitutional: He is oriented to person, place, and time. He appears well-developed and well-nourished. No distress.  obese  HENT:  Head: Normocephalic and atraumatic.  Right Ear: External ear normal.  Left Ear: External ear normal.  Nose: Nose normal.  Eyes: Right eye exhibits no discharge. Left eye exhibits no discharge.  Neck: Neck supple.  Cardiovascular: Normal rate, regular rhythm and normal heart sounds.   Pulmonary/Chest: Effort normal and breath sounds normal. No accessory muscle usage. No tachypnea. He has no rales.  Abdominal: Soft. There is no tenderness.  Musculoskeletal: He exhibits edema.  Right BKA Diffuse pitting edema of BLE up to proximal thighs. Also diffuse swelling of bilateral scrotum and penis although the glans is visible and able to urinate  Neurological: He is alert and oriented to person, place, and time.  Skin: Skin is warm and dry. He is not diaphoretic.  BLE with large ulcers. Appear to have granulation tissue, no signs of infection or drainage  Nursing note and vitals reviewed.    ED Treatments / Results  Labs (all labs ordered are listed, but only abnormal results are displayed) Labs Reviewed  COMPREHENSIVE METABOLIC PANEL - Abnormal; Notable for the following:       Result Value   BUN 24 (*)    Calcium 8.7 (*)    Total Protein 6.0 (*)    Albumin 2.4 (*)    Alkaline Phosphatase 189 (*)    Total Bilirubin 1.9 (*)    All other components within normal limits  BRAIN NATRIURETIC PEPTIDE -  Abnormal; Notable for the following:    B Natriuretic Peptide 3,536.9 (*)    All other components within normal limits  CBC WITH DIFFERENTIAL/PLATELET - Abnormal; Notable for the following:    RDW 18.6 (*)    Lymphs Abs 0.5 (*)    All other components within normal limits  TROPONIN I - Abnormal; Notable for the following:    Troponin I 0.05 (*)  All other components within normal limits    EKG  EKG Interpretation  Date/Time:  Thursday December 20 2016 15:36:37 EDT Ventricular Rate:  76 PR Interval:    QRS Duration: 104 QT Interval:  417 QTC Calculation: 469 R Axis:   -46 Text Interpretation:  Sinus rhythm Multiple premature complexes, vent & supraven Inferior infarct, old Anterior infarct, old Lateral leads are also involved T wave changes V5-6 resolved compared to Aug 2018 Confirmed by Sherwood Gambler [phone]) on 12/20/2016 3:42:49 PM       Radiology Dg Chest 2 View  Result Date: 12/20/2016 CLINICAL DATA:  CHF, fluid retention EXAM: CHEST  2 VIEW COMPARISON:  10/15/2016 FINDINGS: Cardiomegaly with minimal central congestion. Small pleural effusions. No focal consolidation. No pneumothorax. IMPRESSION: Cardiomegaly with minimal central congestion. Small bilateral effusions. Electronically Signed   By: Donavan Foil M.D.   On: 12/20/2016 17:38    Procedures Procedures (including critical care time)  Medications Ordered in ED Medications  furosemide (LASIX) injection 80 mg (80 mg Intravenous Given 12/20/16 1842)     Initial Impression / Assessment and Plan / ED Course  I have reviewed the triage vital signs and the nursing notes.  Pertinent labs & imaging results that were available during my care of the patient were reviewed by me and considered in my medical decision making (see chart for details).     Patient is in no distress. However his workup shows significant CHF. Given the extent of peripheral edema including his scrotum and penis, I think he needs to be  admitted for IV diuretics. While he does not appear ill, notes in Epic shows that the patient has fallen at home recently and been crawling around physical not get up. He has been short of breath at home as well. Given all this, in addition to poor compliance anyway, I think needs to be admitted for diuresis. Family practice to admit. His mildly elevated troponin is at baseline compared to others and likely related to CHF rather than acute ischemia.  Final Clinical Impressions(s) / ED Diagnoses   Final diagnoses:  Peripheral edema  Acute on chronic combined systolic and diastolic congestive heart failure (HCC)    New Prescriptions New Prescriptions   No medications on file     Sherwood Gambler, MD 12/20/16 2002

## 2016-12-20 NOTE — H&P (Signed)
[facility] Admission History and Physical Service Pager: [phone]  Patient name: [name] Medical record number: [id number] Date of birth: 1956-01-29 Age: 61 y.o. Gender: male  Primary Care Provider: Steve Rattler, DO Consultants: advanced heart failure Code Status: Full (confirmed on admission)  Chief Complaint: swelling and "not looking good"  Assessment and Plan:  [name] is a 61 y.o. male with PMH HFrEF, pulm HTN, DM, PVD who presented with bilateral lower extremity edema/scrotal edema.  CHF exacerbation. Has had 2 weeks of bilateral lower extremity edema/scrotal edema, shortness of breath, and BNP ~3500. His symptoms are consisted with a CHF exacerbation especially in the context of 2 week non-compliance with his medications. Patient with an echo from June with ef 20-25%, PA pressure 63. Other possible etiologies of such diffuse swelling are renal failure or liver failure but these are very unlikely given normal liver and kidney labs. Patient without any symptoms of dyspnea aside from a less than one week cough which might be unrelated. Patient is seen by Dr. Jeffie Pollock of advanced heart failure. Will plan for diuresis. Dry weight appears to be ~185lbs per note from 07/15/2015. Unlikely PNA given afebrile, no leukocytosis and CXR without consolidations. - admit to inpatient family medicine, appropriate for telemetry, Dr. Erin Hearing - Vital signs per floor routine - telemetry - strict I/O - daily weights - bmp, cbc in am - pt/ot eval and treat - Lasix '80mg'$  IV daily - hold home metolazone - tylenol prn for pain - continue home bisoprolol ('5mg'$ ), home entresto (49/'51mg'$ ) - Follow up recommendations from heart failure team - hold aspirin 325  Chronic non-healing wounds on BLE Patient with large non-healing chronic venous stasis wounds on RLE managed as outpatient by Dr. Sharol Given and by home health wound care. Likely worsened by crawling on legs  at home. Also has several small developing ulcers on LLE. Will ask wound care to evaluate and provide recommendations for wound care. No purulent drainage or erythema to suggest infection - Consult wound care  Hypertension Blood pressure 135/105-152/110 while in ed. Outpatient regimen consists of lasix, entresto, metolazone. Will hold outpatient regimen of lasix and opt for IV. Will continue entesto, hold metolazone. - Continue home entresto - IV lasix '80mg'$  daily - hold metolazone  Type II DM Last A1c 7.2 in august 2018. Takes lantus 10U at home. Will restart on 5 U lantus, sensitive ssi. Also on metformin at home. - hold metformin - lantus 5U daily - Sensitive SSI   Urinary retention 2/2 BPH - Restart home flomax  Seizure disorder Patient has had seizure disorder for around 1 month per history. Has not received daily keppra for at least two weeks but has had no seizures since being off his medications. Will restart home keppra. Unclear which type of seizure patient has had in past. - keppra '750mg'$  bid  Protein Calorie Malnutrition Albumin 2.4. Has past diagnosis in chart. Supplement meals with ensure shakes. 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 Has not been on eye drops at home for a long period of time.  - F/U with optho as outpatient.  PMH is significant for HFrEF, blindess in left eye, right bka, embolic stroke, seizure, hyperlipidemia, pulmonary hypertension,  chronic non-healing ulcer RLE, protein calorie malnutrition, tricuspid regurgiation  FEN/GI: heart healthy,  carb modified.  Prophylaxis: lovenox  Disposition: home vs snf  History of Present Illness:  [name] is a 61 y.o. male presenting with lower extremity and scrotal swelling that started around two weeks ago.  Two weeks ago the patient decided that he no longer needed his medications and thought he would have a trial off of them. Predictably he started having lower extremity swelling and scrotal swelling almost immediately after stopping his meds. This swelling has continued to gradually increase since that time. He is a part of a program where an EMT brings him his medications every week. He was discovered by this individual who grew concerned because of the swelling and because he was "crawling around on the floor". Patient states that he has not developed any shortness of breath. He does not have dyspnea on exertion. He sleeps on one pillow and night and does not describe any symptoms consistent with orthopnea or paroxysmal nocturnal dyspnea. He has had a mild, non-productive cough. Patient thinks he has probably had some swelling in his abdomen, but cannot see because he is blind.  Patient states that he has had left thigh weakness which also began around 2 weeks ago. Of note he has a previous R BKA. He says that this weakness in his leg is what prompted his crawling around. Patient has chronic wound on his right bka stump and other wounds on his left leg. He states that the largest wound (right bka stump) started around 2 months ago. He has a home health nurse that comes and helps with that. He also sees Dr. Sharol Given for these as well. He states that these have become more painful due to walking around on knees. At his baseline he uses a wheelchair and walker. He has not been able to use the walker much, and has mostly been reliant on the wheelchair. Patient's family has discussed sending him to snf.  Workup in the Ed consisted of cmp, cbc, bnp, troponins, cxr, ekg. CMP significant for alk phos 189, ca 8.7, alb 2.4. BNP significant for ~3500. Trop 0.05. CXr showed cardiomegaly with minimal congestion. Small bilateral effusions. Received '80mg'$  IV lasix x1.   Review Of Systems: Per HPI with the following  additions:  Review of Systems  Constitutional: Negative for chills, diaphoresis and fever.  HENT: Negative for congestion and sore throat.   Eyes: Negative for double vision.  Respiratory: Positive for cough and shortness of breath. Negative for sputum production.   Cardiovascular: Positive for leg swelling. Negative for chest pain, palpitations and orthopnea.  Gastrointestinal: Negative for constipation, diarrhea, nausea and vomiting.  Neurological: Positive for seizures (one month ago). Negative for dizziness.    Patient Active Problem List   Diagnosis Date Noted  . Embolic stroke ([place]) 75/12/2583  . Cardiomyopathy, dilated ([place]) 10/19/2016  . Acute on chronic systolic heart failure, NYHA class 3 ([place]) 10/19/2016  . Moderate to severe pulmonary hypertension ([place]) 10/19/2016  . Severe tricuspid regurgitation by prior echocardiogram 10/19/2016  . Diabetes mellitus type 2 in obese ([place]) 10/19/2016  . HLD (hyperlipidemia) 10/19/2016  . Seizure ([place]) 10/19/2016  . Fluid overload 10/14/2016  . 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 ([place]) 10/09/2016  . Cardiorenal syndrome with renal failure 09/28/2016  . Acute urinary retention 09/26/2016  . Acute on chronic combined systolic and diastolic CHF (congestive heart failure) ([place]) 09/26/2016  . Scrotal edema 09/24/2016  . Multiple open wounds of lower leg, initial encounter 09/24/2016  . Tinea  of groin 09/24/2016  . Chronic kidney disease (CKD), stage III (moderate) ([place]) 06/07/2016  . Chronic combined systolic and diastolic congestive heart failure ([place])   . Protein calorie malnutrition ([place]) 07/12/2015  . Congestive dilated cardiomyopathy ([place]) 07/11/2015  . Pressure ulcer 07/09/2015  . Open knee wound 07/06/2015  . History of right below knee amputation ([place]) 02/22/2015  . PAD (peripheral artery disease) ([place]) 02/22/2015  . Blindness of left eye 01/04/2015  . Complications,  amputation stump late ([place]) 08/06/2014  . Diabetic neuropathy associated with type 2 diabetes mellitus ([place]) 12/01/2013  . Noncompliance with medications 07/13/2010  . OTHER SPEC TYPES SCHIZOPHRENIA UNSPEC CONDITION 05/24/2009  . Obesity, unspecified 06/09/2007  . DM (diabetes mellitus), type 2, uncontrolled ([place]) 05/02/2006  . Essential hypertension, benign 05/02/2006    Past Medical History: Past Medical History:  Diagnosis Date  . Acute on chronic systolic heart failure, NYHA class 3 ([place])   . AKI (acute kidney injury) ([place])   . Anemia   . BKA stump complication ([place])   . Blind left eye   . Blind left eye   . Cardiomyopathy, dilated ([place])   . CHF (congestive heart failure) ([place])   . Combined systolic and diastolic congestive heart failure ([place])   . Congestive dilated cardiomyopathy ([place]) 07/11/2015  . Diabetes mellitus   . Diabetic neuropathy associated with type 2 diabetes mellitus ([place])   . Hypertension   . Neuropathy   . Noncompliance with medications   . Open knee wound 10/2015   on rt bka  . PAD (peripheral artery disease) ([place])   . Protein calorie malnutrition ([name])   . Shortness of breath dyspnea     Past Surgical History: Past Surgical History:  Procedure Laterality Date  . AMPUTATION Right 09/26/2013   Procedure: AMPUTATION BELOW KNEE;  Surgeon: Rozanna Box, MD;  Location: [place];  Service: Orthopedics;  Laterality: Right;  . AMPUTATION Right 05/01/2015   Procedure: REVISION OF RIGHT TRANSTIBIAL AMPUTATION ;  Surgeon: Newt Minion, MD;  Location: [place];  Service: Orthopedics;  Laterality: Right;  . APPLICATION OF WOUND VAC  08/10/2016   Procedure: APPLICATION OFI NCISONAL  WOUND VAC;  Surgeon: Newt Minion, MD;  Location: [place];  Service: Orthopedics;;  . CARDIAC CATHETERIZATION N/A 07/13/2015   Procedure: Right/Left Heart Cath and Coronary Angiography;  Surgeon: Jolaine Artist, MD;  Location: [place];  Service: Cardiovascular;  Laterality: N/A;   . COLONOSCOPY    . I&D EXTREMITY Right 09/24/2013   Procedure: IRRIGATION AND DEBRIDEMENT RIGHT FOOT ULCER;  Surgeon: Renette Butters, MD;  Location: [place];  Service: Orthopedics;  Laterality: Right;  . I&D EXTREMITY Right 09/26/2013   Procedure: Repeat IRRIGATION AND DEBRIDEMENT Right Foot Ulcer;  Surgeon: Rozanna Box, MD;  Location: [place];  Service: Orthopedics;  Laterality: Right;  . MULTIPLE TOOTH EXTRACTIONS    . SKIN SPLIT GRAFT Right 08/10/2016   Procedure: SKIN GRAFT SPLIT THICKNESS RIGHT LEG;  Surgeon: Newt Minion, MD;  Location: [place];  Service: Orthopedics;  Laterality: Right;  . STUMP REVISION Right 04/20/2015   Procedure: Revision Right Below Knee Amputation, Apply Wound VAC;  Surgeon: Newt Minion, MD;  Location: [place];  Service: Orthopedics;  Laterality: Right;  . TONSILLECTOMY      Social History: Social History  Substance Use Topics  . Smoking status: Former Smoker    Years: 20.00    Quit date: 05/10/2000  . Smokeless tobacco: Former Systems developer  Quit date: 02/22/2000  . Alcohol use No   Additional social history: 10 pack year history. No alcohol since 2002. No recreational drug use.  Lives at home by himself with home health Patient's sister has discussed sending him to snf with patient Please also refer to relevant sections of EMR.  Family History: Family History  Problem Relation Age of Onset  . Cancer Mother   . Peripheral vascular disease Father    Allergies and Medications: No Known Allergies No current facility-administered medications on file prior to encounter.    Current Outpatient Prescriptions on File Prior to Encounter  Medication Sig Dispense Refill  . aspirin 325 MG tablet Take 1 tablet (325 mg total) by mouth daily. 30 tablet 0  . atorvastatin (LIPITOR) 40 MG tablet Take 1 tablet (40 mg total) by mouth daily. 30 tablet 0  . bacitracin ointment Apply topically 2 (two) times daily. 120 g 0  . bisoprolol (ZEBETA) 5 MG tablet Take 1 tablet (5 mg  total) by mouth daily. 30 tablet 1  . brimonidine (ALPHAGAN) 0.2 % ophthalmic solution PLACE 1 DROP INTO THE LEFT EYE 2 (TWO) TIMES DAILY. (Patient taking differently: Place 1 drop into the left eye daily. ) 5 mL 0  . dorzolamide-timolol (COSOPT) 22.3-6.8 MG/ML ophthalmic solution Place 1 drop into the left eye 2 (two) times daily. (Patient taking differently: Place 1 drop into the left eye daily. ) 10 mL 12  . furosemide (LASIX) 80 MG tablet Take 80 mg (1 tabs) in am and 40 mg (1/2 tab) in pm (Patient taking differently: Take 80 mg by mouth daily. ) 45 tablet 6  . insulin glargine (LANTUS) 100 UNIT/ML injection Inject 10 Units into the skin at bedtime.     . levETIRAcetam (KEPPRA) 750 MG tablet Take 1 tablet (750 mg total) by mouth 2 (two) times daily. 60 tablet 0  . metFORMIN (GLUCOPHAGE) 500 MG tablet Take 1 tablet (500 mg total) by mouth 2 (two) times daily with a meal. 60 tablet 6  . metolazone (ZAROXOLYN) 2.5 MG tablet Take 1 tablet (2.5 mg total) by mouth daily. 30 tablet 0  . potassium chloride SA (K-DUR,KLOR-CON) 20 MEQ tablet Take 40 mEq by mouth daily.     . sacubitril-valsartan (ENTRESTO) 49-51 MG Take 1 tablet by mouth 2 (two) times daily. 60 tablet 3  . tamsulosin (FLOMAX) 0.4 MG CAPS capsule Take 1 capsule (0.4 mg total) by mouth daily after supper. 30 capsule 0  . vitamin A 10000 UNIT capsule Take 10,000 Units by mouth daily.      Objective: BP (!) 135/107   Pulse 73   Temp 98.1 F (36.7 C) (Rectal)   Resp 12   SpO2 92%  Exam: General: alert, oriented x4, resting comfortably. Eating a sandwich during exam.  Eyes: EOMI. Patient appears to be blind in left eye. Has noticeable cataract in left eye. No pupillary reaction. Pupil reactive in right eye. Able to see out of that eye ENTM: oropharynx without any trauma. Decent dentition. No signs of external trauma nose and ears. Neck: no cervical lymphadenopathy, range of motion intact Cardiovascular: regular rate. Abnormal rhythm.  No murmurs. Very weakly palpable left DP. Palpable radial pulse bilaterally.  Respiratory: moderately coarse breath sounds bilateral lower lobes, no increased work of breathing, on room air. Gastrointestinal: soft, non-tender, pitting edema noted. Bowel sounds present. MSK: Right BKA noted. 2-3+ pitting edema noted from foot of LLE and stump of RLE up. Noticeable edema in bilateral thighs and hips.  No weakness in arms. Derm: Patient with cool to touch LLE foot. Skin breakdown noted in multiple sites LLE. Large chronic non-healing ulcer noted in anterior portion of R B Neuro: aox4, cn2-12 intact. No focal neuro deficit noted. 5/5 strength LLE all muscle groups. 5/5 strength all muscle groups BUE. Sensation intact LLE and RLE bka stump. Psych: appropriate  Labs and Imaging: CBC BMET   Recent Labs Lab 12/20/16 1550  WBC 6.1  HGB 14.6  HCT 43.2  PLT 231    Recent Labs Lab 12/20/16 1550  NA 137  K 4.5  CL 105  CO2 22  BUN 24*  CREATININE 1.14  GLUCOSE 86  CALCIUM 8.7*     Troponin 0.05  BNP 3536.9  Dg Chest 2 View  Result Date: 12/20/2016 CLINICAL DATA:  CHF, fluid retention EXAM: CHEST  2 VIEW COMPARISON:  10/15/2016 FINDINGS: Cardiomegaly with minimal central congestion. Small pleural effusions. No focal consolidation. No pneumothorax. IMPRESSION: Cardiomegaly with minimal central congestion. Small bilateral effusions. Electronically Signed   By: Donavan Foil M.D.   On: 12/20/2016 17:38    Guadalupe Dawn, MD 12/20/2016, 6:06 PM PGY-1, [place] Intern pager: [phone], text pages welcome  FPTS Upper-Level Resident Addendum  I have independently interviewed and examined the patient. I have discussed the above with the original author and agree with their documentation. My edits for correction/addition/clarification are in blue. Please see also any attending notes.   Bufford Lope, DO PGY-2, [place] [facility] 12/20/2016 8:50 PM  FPTS  Service pager: [phone] (text pages welcome through [place])

## 2016-12-20 NOTE — Telephone Encounter (Signed)
LATE ENTRY:    Dee with [facility] called during patients home visit to report patient is not doing well.  Reports he has swelling up to his face Blisters/sore on his stumps bilaterally- pt is unable to walk/use prosthetics (crawls through the home to move about) Severe SOB Patient has not taken medication in a very long time. Patient reports he does not want to come to ER however he is willing to go to SNF/Rehab  Per Oda Kilts Patient should report to ER for further evaluation   [facility] aware and will arrange transport to ER

## 2016-12-20 NOTE — ED Notes (Signed)
Date and time results received: 12/20/16 1709 (use smartphrase ".now" to insert current time)  Test: troponin Critical Value: 0.05  Name of Provider Notified: Dr. Regenia Skeeter  Orders Received? Or Actions Taken?: in department

## 2016-12-20 NOTE — Plan of Care (Signed)
Problem: Education: Goal: Knowledge of [facility] Education information/materials will improve Outcome: Progressing Patient needs information repeated  Problem: Safety: Goal: Ability to remain free from injury will improve Outcome: Progressing Bed low, alarm on, instructed to use call bell with return demonstration   Problem: Skin Integrity: Goal: Risk for impaired skin integrity will decrease Outcome: Progressing Wounds covered with foam and pressure points covered with foam

## 2016-12-20 NOTE — ED Triage Notes (Signed)
Per EMS, pt from home and has not taken any of his meds in 1 month. Pt has CHF and according to the [facility] pt has 100 extra pounds of fluid. VS 150/112, 98% on RA, CBG 91, RR 14. Pt has ulcers to both lower extremities from "where I've been crawling around on the floor"

## 2016-12-20 NOTE — ED Notes (Signed)
Meal ordered for PT 

## 2016-12-20 NOTE — ED Notes (Signed)
Admitting team at bedside.

## 2016-12-20 NOTE — Progress Notes (Signed)
Paramedicine Encounter    Patient ID: [name], male    DOB: 29-Jan-1956, 61 y.o.   MRN: [id number]    Patient Care Team: Steve Rattler, DO as PCP - General  Patient Active Problem List   Diagnosis Date Noted  . Embolic stroke ([place]) 83/15/1761  . Cardiomyopathy, dilated ([place]) 10/19/2016  . Acute on chronic systolic heart failure, NYHA class 3 ([place]) 10/19/2016  . Moderate to severe pulmonary hypertension ([place]) 10/19/2016  . Severe tricuspid regurgitation by prior echocardiogram 10/19/2016  . Diabetes mellitus type 2 in obese ([place]) 10/19/2016  . HLD (hyperlipidemia) 10/19/2016  . Seizure ([place]) 10/19/2016  . Fluid overload 10/14/2016  . 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 ([place]) 10/09/2016  . Cardiorenal syndrome with renal failure 09/28/2016  . Acute urinary retention 09/26/2016  . Acute on chronic combined systolic and diastolic CHF (congestive heart failure) ([place]) 09/26/2016  . Scrotal edema 09/24/2016  . Multiple open wounds of lower leg, initial encounter 09/24/2016  . Tinea of groin 09/24/2016  . Chronic kidney disease (CKD), stage III (moderate) ([place]) 06/07/2016  . Chronic combined systolic and diastolic congestive heart failure ([place])   . Protein calorie malnutrition ([place]) 07/12/2015  . Congestive dilated cardiomyopathy ([place]) 07/11/2015  . Pressure ulcer 07/09/2015  . Open knee wound 07/06/2015  . History of right below knee amputation ([place]) 02/22/2015  . PAD (peripheral artery disease) ([place]) 02/22/2015  . Blindness of left eye 01/04/2015  . Complications, amputation stump late ([place]) 08/06/2014  . Diabetic neuropathy associated with type 2 diabetes mellitus ([place]) 12/01/2013  . Noncompliance with medications 07/13/2010  . OTHER SPEC TYPES SCHIZOPHRENIA UNSPEC CONDITION 05/24/2009  . Obesity, unspecified 06/09/2007  . DM (diabetes mellitus), type 2, uncontrolled ([place]) 05/02/2006  . Essential hypertension, benign  05/02/2006    Current Outpatient Prescriptions:  .  aspirin 325 MG tablet, Take 1 tablet (325 mg total) by mouth daily., Disp: 30 tablet, Rfl: 0 .  atorvastatin (LIPITOR) 40 MG tablet, Take 1 tablet (40 mg total) by mouth daily., Disp: 30 tablet, Rfl: 0 .  bacitracin ointment, Apply topically 2 (two) times daily., Disp: 120 g, Rfl: 0 .  bisoprolol (ZEBETA) 5 MG tablet, Take 1 tablet (5 mg total) by mouth daily., Disp: 30 tablet, Rfl: 1 .  brimonidine (ALPHAGAN) 0.2 % ophthalmic solution, PLACE 1 DROP INTO THE LEFT EYE 2 (TWO) TIMES DAILY. (Patient taking differently: Place 1 drop into the left eye daily. ), Disp: 5 mL, Rfl: 0 .  dorzolamide-timolol (COSOPT) 22.3-6.8 MG/ML ophthalmic solution, Place 1 drop into the left eye 2 (two) times daily. (Patient taking differently: Place 1 drop into the left eye daily. ), Disp: 10 mL, Rfl: 12 .  furosemide (LASIX) 80 MG tablet, Take 80 mg (1 tabs) in am and 40 mg (1/2 tab) in pm (Patient taking differently: Take 80 mg by mouth daily. ), Disp: 45 tablet, Rfl: 6 .  insulin glargine (LANTUS) 100 UNIT/ML injection, Inject 10 Units into the skin at bedtime. , Disp: , Rfl:  .  levETIRAcetam (KEPPRA) 750 MG tablet, Take 1 tablet (750 mg total) by mouth 2 (two) times daily., Disp: 60 tablet, Rfl: 0 .  metFORMIN (GLUCOPHAGE) 500 MG tablet, Take 1 tablet (500 mg total) by mouth 2 (two) times daily with a meal., Disp: 60 tablet, Rfl: 6 .  metolazone (ZAROXOLYN) 2.5 MG tablet, Take 1 tablet (2.5 mg total) by mouth daily., Disp: 30 tablet, Rfl: 0 .  potassium chloride  SA (K-DUR,KLOR-CON) 20 MEQ tablet, Take 40 mEq by mouth daily. , Disp: , Rfl:  .  sacubitril-valsartan (ENTRESTO) 49-51 MG, Take 1 tablet by mouth 2 (two) times daily., Disp: 60 tablet, Rfl: 3 .  tamsulosin (FLOMAX) 0.4 MG CAPS capsule, Take 1 capsule (0.4 mg total) by mouth daily after supper., Disp: 30 capsule, Rfl: 0 .  vitamin A 10000 UNIT capsule, Take 10,000 Units by mouth daily., Disp: , Rfl:  No  Known Allergies   Social History   Social History  . Marital status: Married    Spouse name: N/A  . Number of children: N/A  . Years of education: N/A   Occupational History  . Not on file.   Social History Main Topics  . Smoking status: Former Smoker    Years: 20.00    Quit date: 05/10/2000  . Smokeless tobacco: Former Systems developer    Quit date: 02/22/2000  . Alcohol use No  . Drug use: No  . Sexual activity: Not on file   Other Topics Concern  . Not on file   Social History Narrative   ** Merged History Encounter **       Lives alone.     Physical Exam  Pulmonary/Chest: No respiratory distress.  Abdominal: He exhibits distension. There is no tenderness.  Genitourinary: Penile tenderness present.  Musculoskeletal: He exhibits edema.  Skin: Skin is warm and dry. He is not diaphoretic.        Future Appointments Date Time Provider [place]  12/25/2016 2:40 PM Troy Sine, MD [facility]    ATF pt [name] x4 sitting on his bed and can not get up from his current position.  Pt has urinated on himself and hasn't eaten all day.  Pt is retaining a lot of fluid. He hasn't taken any medication in abut 2 weeks. pts sister stated that he fell on Sunday and couldn't get himself up, therefore he was left on the floor for a whole day without assistance.  Pt has multiple open sores on both legs. The advance home nurse hasn't seen him in over two weeks.  Pt sores has gotten worse due to him crawling around on the carpet.  Pt appears to be sob and he admits to not feeling well.  I called [facility] clinic and advised them of his presentation and we agree that ED transport is neccessary.  Pt is agitated at this time but he agrees on transport.     Pulse 72   Resp 16   SpO2 97%    Demetria Copper, EMT Paramedic 12/20/2016    ACTION: Home visit completed

## 2016-12-21 DIAGNOSIS — L8990 Pressure ulcer of unspecified site, unspecified stage: Secondary | ICD-10-CM | POA: Insufficient documentation

## 2016-12-21 DIAGNOSIS — I5023 Acute on chronic systolic (congestive) heart failure: Secondary | ICD-10-CM

## 2016-12-21 DIAGNOSIS — L89899 Pressure ulcer of other site, unspecified stage: Secondary | ICD-10-CM

## 2016-12-21 DIAGNOSIS — E46 Unspecified protein-calorie malnutrition: Secondary | ICD-10-CM

## 2016-12-21 LAB — BASIC METABOLIC PANEL
Anion gap: 9 (ref 5–15)
BUN: 27 mg/dL — ABNORMAL HIGH (ref 6–20)
CO2: 26 mmol/L (ref 22–32)
Calcium: 8.8 mg/dL — ABNORMAL LOW (ref 8.9–10.3)
Chloride: 105 mmol/L (ref 101–111)
Creatinine, Ser: 1.25 mg/dL — ABNORMAL HIGH (ref 0.61–1.24)
GFR calc Af Amer: >60 mL/min (ref >60)
GFR calc non Af Amer: >60 mL/min (ref >60)
Glucose, Bld: 76 mg/dL (ref 65–99)
Potassium: 3.8 mmol/L (ref 3.5–5.1)
Sodium: 140 mmol/L (ref 135–145)

## 2016-12-21 LAB — CK: Total CK: 274 U/L (ref 49–397)

## 2016-12-21 LAB — GLUCOSE, CAPILLARY
Glucose-Capillary: 62 mg/dL — ABNORMAL LOW (ref 65–99)
Glucose-Capillary: 86 mg/dL (ref 65–99)
Glucose-Capillary: 61 mg/dL — ABNORMAL LOW (ref 65–99)
Glucose-Capillary: 76 mg/dL (ref 65–99)
Glucose-Capillary: 105 mg/dL — ABNORMAL HIGH (ref 65–99)
Glucose-Capillary: 110 mg/dL — ABNORMAL HIGH (ref 65–99)

## 2016-12-21 LAB — TROPONIN I: Troponin I: 0.06 ng/mL (ref <0.03)

## 2016-12-21 MED ORDER — FUROSEMIDE 10 MG/ML IJ SOLN
160.0000 mg | Freq: Two times a day (BID) | INTRAVENOUS | Status: DC
  Administered 2016-12-21 – 2016-12-27 (×12): 160 mg via INTRAVENOUS
  Filled 2016-12-21 (×6): qty 16
  Filled 2016-12-21: qty 10
  Filled 2016-12-21: qty 2
  Filled 2016-12-21: qty 16
  Filled 2016-12-21: qty 4
  Filled 2016-12-21: qty 10
  Filled 2016-12-21: qty 16
  Filled 2016-12-21: qty 2
  Filled 2016-12-21: qty 16

## 2016-12-21 NOTE — Evaluation (Signed)
Physical Therapy Evaluation Patient Details Name: [name] MRN: [id number] DOB: 1955/10/28 Today's Date: 12/21/2016   History of Present Illness  61 y.o. male with PMH HFrEF, pulm HTN, DM, PVD who presented with bilateral lower extremity edema/scrotal edema.  Clinical Impression  Pt admitted with above diagnosis. Pt currently with functional limitations due to the deficits listed below (see PT Problem List). Pt currently limited by generalized weakness and pain. Pt currently, modA for bed mobility and maxA for squat pivot transfer from bed to [facility] and BSC to recliner. Pt will benefit from skilled PT to increase their independence and safety with mobility to allow discharge to the venue listed below.       Follow Up Recommendations SNF    Equipment Recommendations   (to be determined at next venue)    Recommendations for Other Services       Precautions / Restrictions Precautions Precautions: Fall Restrictions Weight Bearing Restrictions: No      Mobility  Bed Mobility Overal bed mobility: Needs Assistance Bed Mobility: Supine to Sit     Supine to sit: Mod assist;Min assist     General bed mobility comments: Increased effort, heavy use of bed rails and momentum. Assist to steady during power up  Transfers Overall transfer level: Needs assistance Equipment used: 2 person hand held assist Transfers: Squat Pivot Transfers     Squat pivot transfers: Max assist;+2 physical assistance     General transfer comment: from EOB to recliner. max +2 squat pivot. towards left side. assist to powerup, steady, and control movement.      Balance Overall balance assessment: Needs assistance Sitting-balance support: Bilateral upper extremity supported;Feet supported Sitting balance-Leahy Scale: Fair                                       Pertinent Vitals/Pain Pain Assessment: Faces Faces Pain Scale: Hurts even more Pain Location: scrotal area with  movement Pain Descriptors / Indicators: Grimacing;Guarding Pain Intervention(s): Monitored during session;Limited activity within patient's tolerance    Home Living Family/patient expects to be discharged to:: Skilled nursing facility Living Arrangements: Alone               Additional Comments: pt states he washes at sink.  bathroom is not fully accessible to w/c, and he reports he lowers himself to the floor, and crawls to the toilet, then climbs up on it.     Prior Function Level of Independence: Needs assistance   Gait / Transfers Assistance Needed: Primarily uses manual w/c for mobility; w/c does not fit into bathroom, so pt crawls on knees contributing to bilat knee wounds  ADL's / Homemaking Assistance Needed: aide assists with cooking/household tasks 3x/wk; sister helps intermittently other days           Extremity/Trunk Assessment   Upper Extremity Assessment Upper Extremity Assessment: Defer to OT evaluation    Lower Extremity Assessment Lower Extremity Assessment: RLE deficits/detail;LLE deficits/detail RLE Deficits / Details: R BKA, R knee lacking 10 degrees of extension LLE Deficits / Details: hip and knee ROM WFL        Communication   Communication: Expressive difficulties  Cognition Arousal/Alertness: Awake/alert Behavior During Therapy: WFL for tasks assessed/performed Overall Cognitive Status: Within Functional Limits for tasks assessed  General Comments General comments (skin integrity, edema, etc.): VSS thoroughout session         Assessment/Plan    PT Assessment Patient needs continued PT services  PT Problem List Decreased strength;Decreased range of motion;Decreased activity tolerance;Decreased balance;Decreased mobility;Decreased safety awareness;Pain       PT Treatment Interventions DME instruction;Gait training;Functional mobility training;Therapeutic activities;Therapeutic  exercise;Balance training;Cognitive remediation;Patient/family education;Wheelchair mobility training    PT Goals (Current goals can be found in the Care Plan section)  Acute Rehab PT Goals Patient Stated Goal: go to rehab PT Goal Formulation: With patient Time For Goal Achievement: 01/04/17 Potential to Achieve Goals: Fair    Frequency Min 2X/week   Barriers to discharge Inaccessible home environment;Decreased caregiver support      Co-evaluation PT/OT/SLP Co-Evaluation/Treatment: Yes Reason for Co-Treatment: For patient/therapist safety;To address functional/ADL transfers PT goals addressed during session: Mobility/safety with mobility OT goals addressed during session: ADL's and self-care       AM-PAC PT "6 Clicks" Daily Activity  Outcome Measure Difficulty turning over in bed (including adjusting bedclothes, sheets and blankets)?: Unable Difficulty moving from lying on back to sitting on the side of the bed? : Unable Difficulty sitting down on and standing up from a chair with arms (e.g., wheelchair, bedside commode, etc,.)?: Unable Help needed moving to and from a bed to chair (including a wheelchair)?: Total Help needed walking in hospital room?: Total Help needed climbing 3-5 steps with a railing? : Total 6 Click Score: 6    End of Session Equipment Utilized During Treatment: Gait belt Activity Tolerance: Patient limited by pain Patient left: in chair;with chair alarm set;with call bell/phone within reach Nurse Communication: Mobility status PT Visit Diagnosis: Unsteadiness on feet (R26.81);Other abnormalities of gait and mobility (R26.89);Repeated falls (R29.6);Muscle weakness (generalized) (M62.81);History of falling (Z91.81);Difficulty in walking, not elsewhere classified (R26.2);Pain Pain - Right/Left: Left Pain - part of body: Leg    Time: 7035-0093 PT Time Calculation (min) (ACUTE ONLY): 37 min   Charges:   PT Evaluation $PT Eval Moderate Complexity: 1  Mod     PT G Codes:        Elizabeth B. Migdalia Dk PT, DPT Acute Rehabilitation  [phone] Pager [phone]    [place] 12/21/2016, 12:38 PM

## 2016-12-21 NOTE — Consult Note (Signed)
[facility] Nurse wound consult note Reason for Consult: Consult requested for multiple wounds to BLE. Pt has been followed by Dr Sharol Given as an outpatient and he has ordered silver dressings, according to the patient.  Ortho service recently assessed the wounds on 10/4, according to the EMR.  Pt states he has worn Una boots in the past, but not recently. Wound type: Left 2nd toe with full thickness wound; .5X.5X.2cm, 80% red, 20% yellow, moist wound bed, mod amt tan drainage, no odor Left upper knee full thickness wound; 2X2X.2cm, 90% dark red woundbed, 10% yellow, mod amt tan drainage, no odor Left lower knee with black dry eschar, 3X3cm, no odor or drainage. Left posterior leg with full thickness wound; 4X4X.2cm, 80% red, 20% yellow, mod amt tan drainage, no odor Left anterior calf with scattered multiple full thickness wounds of various sizes, affected area is 16X8X.2cm, mod amt tan drainage, wound beds are 50% red, 50% yellow, no odor Right knee with dry scab, .8X.8cm, no odor, drainage, or fluctuance Right lower knee with full thickness wound; 5X6X.3cm, 90% red, 10% yellow, bone palpable with swab, middle of the wound bed has a deeper area which is draining large amt tan liquid, some odor. Dressing procedure/placement/frequency: Continue previous plan of care with Aquacel to absorb drainage and provide antibacterial benefits.  ABD pads and kerlex to absorb drainage.  Xeroform gauze over eschar to left knee.  Foam dressing to left toe. Pt should resume follow-up with Dr Sharol Given after discharge. Please re-consult if further assistance is needed.  Thank-you,  Julien Girt MSN, [place], [place], [place], [place]

## 2016-12-21 NOTE — Clinical Social Work Placement (Signed)
   CLINICAL SOCIAL WORK PLACEMENT  NOTE  Date:  12/21/2016  Patient Details  Name: [name] MRN: [id number] Date of Birth: 11-05-1955  Clinical Social Work is seeking post-discharge placement for this patient at the [place] level of care (*CSW will initial, date and re-position this form in  chart as items are completed):  Yes   Patient/family provided with [facility] Work Department's list of facilities offering this level of care within the geographic area requested by the patient (or if unable, by the patient's family).  Yes   Patient/family informed of their freedom to choose among providers that offer the needed level of care, that participate in Medicare, Medicaid or managed care program needed by the patient, have an available bed and are willing to accept the patient.  Yes   Patient/family informed of [facility]'s ownership interest in [facility] and [facility], as well as of the fact that they are under no obligation to receive care at these facilities.  PASRR submitted to EDS on 12/21/16     PASRR number received on       Existing PASRR number confirmed on       FL2 transmitted to all facilities in geographic area requested by pt/family on 12/21/16     FL2 transmitted to all facilities within larger geographic area on       Patient informed that his/her managed care company has contracts with or will negotiate with certain facilities, including the following:            Patient/family informed of bed offers received.  Patient chooses bed at       Physician recommends and patient chooses bed at      Patient to be transferred to   on  .  Patient to be transferred to facility by       Patient family notified on   of transfer.  Name of family member notified:        PHYSICIAN Please sign FL2     Additional Comment:    _______________________________________________ Candie Chroman, LCSW 12/21/2016, 2:23  PM

## 2016-12-21 NOTE — Clinical Social Work Note (Signed)
Clinical Social Work Assessment  Patient Details  Name: [name] MRN: [id number] Date of Birth: 05-25-1955  Date of referral:  12/21/16               Reason for consult:  Facility Placement, Discharge Planning                Permission sought to share information with:  Facility Sport and exercise psychologist, Family Supports Permission granted to share information::  Yes, Verbal Permission Granted  Name::     Doree Fudge  Agency::  SNF's  Relationship::  Sister  Contact Information:  [phone]  Housing/Transportation Living arrangements for the past 2 months:  Apartment Source of Information:  Patient, Scientist, water quality, Other (Comment Required) (Sister) Patient Interpreter Needed:  None Criminal Activity/Legal Involvement Pertinent to Current Situation/Hospitalization:  No - Comment as needed Significant Relationships:  Siblings Lives with:  Self Do you feel safe going back to the place where you live?  Yes Need for family participation in patient care:  Yes (Comment)  Care giving concerns:  PT recommending SNF once medically stable for discharge.   Social Worker assessment / plan:  CSW met with patient. No supports at bedside. CSW introduced role and explained that PT recommendations would be discussed. Patient stated that he did not have a choice in regards to SNF vs. Return home. CSW explained that he does have a choice but his family is concerned about him living home alone. Patient expressed understanding and asked CSW to call his sister, Doree Fudge, regarding SNF placement. Patient's sister is interested in LTC. She is not familiar with local facilities but will discuss with her sister as well. PASARR is pending. Patient cannot go to SNF until PASARR obtained. No further concerns. CSW encouraged patient and his sister to contact CSW as needed. CSW will continue to follow patient and his sister for support and facilitate discharge to SNF once medically  stable.  Employment status:  Disabled (Comment on whether or not currently receiving Disability) Insurance information:  Medicaid In [place] PT Recommendations:  [place] / Referral to community resources:  [place] [address]  Patient/Family's Response to care:  Patient and his sister agreeable to SNF placement. Patient's sisters supportive and involved in patient's care. Patient and his sister appreciated social work intervention.  Patient/Family's Understanding of and Emotional Response to Diagnosis, Current Treatment, and Prognosis:  Patient and his sister have a good understanding of the reason for admission and his need for rehab at this time. Patient and his sister appear happy with hospital care.  Emotional Assessment Appearance:  Appears stated age Attitude/Demeanor/Rapport:  Other (Pleasant) Affect (typically observed):  Accepting, Appropriate, Calm, Pleasant Orientation:  Oriented to Self, Oriented to Place, Oriented to  Time, Oriented to Situation Alcohol / Substance use:  Never Used Psych involvement (Current and /or in the community):  No (Comment)  Discharge Needs  Concerns to be addressed:  Care Coordination Readmission within the last 30 days:  No Current discharge risk:  Dependent with Mobility, Lives alone Barriers to Discharge:  Continued Medical Work up, Programmer, applications ([place])   Candie Chroman, LCSW 12/21/2016, 2:19 PM

## 2016-12-21 NOTE — Care Management Note (Signed)
Case Management Note  Patient Details  Name: [name] MRN: [id number] Date of Birth: Jul 09, 1955  Subjective/Objective:   CHF                Action/Plan: Patient known from frequent admissions ( adm x 4 in 6 months). Primary Care Provider: Steve Rattler, DO; has private insurance with Medicaid with prescription drug coverage; is followed by Paramedicine with the HF team; ? Compliance with medication; awaiting for Physical Therapy eval for disposition needs, possibly needs SNF at discharge; referral made to [facility] ( all inclusive care for the [facility]). CM will continue to follow for DCP.   Expected Discharge Date:   12/27/2016               Expected Discharge Plan: possibly [place]  Discharge planning Services  CM Consult  Status of Service:  In process, will continue to follow  Sherrilyn Rist [phone] 12/21/2016, 10:05 AM

## 2016-12-21 NOTE — Progress Notes (Signed)
Family Medicine Teaching Service Daily Progress Note Intern Pager: [phone]  Patient name: [name] Medical record number: [id number] Date of birth: 01-09-1956 Age: 61 y.o. Gender: male  Primary Care Provider: Steve Rattler, DO Consultants: Advanced heart failure Code Status: Full   Pt Overview and Major Events to Date:  Admitted to [facility] on 12/20/2016  Assessment and Plan: [name] is a 61 y.o. male presenting with bilateral lower extremity edema/scrotal edema. 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  CHF exacerbation Has had 2 weeks of bilateral lower extremity edema/scrotal edema, and shortness of breath. BNP on admission elevated to 3536.9. Symptoms consistent with CHF exacerbation. Patient also reports 2 week non-compliance with medications. Last echo from July 2018 with EF 20-25%, PA pressure 63. Patient without any symptoms of dyspnea aside from a less than one week cough which might be unrelated. Patient is seen by Dr. Jeffie Pollock of advanced heart failure, have consulted while inpatient. Dry weight appears to be ~185lbs per note from 07/15/2015. Most recent measured weight 218lb. Patient having poor urine output even with increased IV lasix 80mg  daily.  - Vital signs per floor routine - cardiac monitoring  - strict I/Os - daily weights - pt/ot eval and treat - increase IV Lasix to 160mg  bid - hold home metolazone - tylenol prn for pain - continue home bisoprolol (5mg ), home entresto (49/51mg ) - consult advanced heart failure, appreciate recommendations  - hold aspirin 325  Elevated troponin Troponins trended at 0.05>0.05>0.06. Likely secondary to demand ischemia from decompensated CHF and renal insufficiency. Per chart review patient has history of elevated troponin, with recent admission in  August with similar readings. Mild and flat trend. Patient denies chest pain. Per nursing staff patient has been in normal sinus rhythm on telemetry.  -continue to monitor on telemetry -monitor EKGs   Chronic non-healing wounds on BLE Patient with large non-healing chronic venous stasis wounds on RLE managed as outpatient by Dr. Sharol Given and by home health wound care. ABI on 09/2016 showing left arterial flow wnl with difficult right exam given right BKA. Likely worsened by crawling on legs at home. Also has several small developing ulcers on LLE. Will ask wound care to evaluate and provide recommendations for wound care. Patient had dressing presenting from wound care.  - Consult wound care, appreciate recommendations   Found down Patient was found down by home health nurse. Likely 2/2 CHF exacerbation causing increased edema and weakness. Cannot rule out syncopal event. Less likely MI given negative EKG on admission for ST changes. Less likely CVA given no other neurological symptoms. Less likely pneumonia given negative CXR on admission. Will need to consider secondary problems of dehydration, hypothermia, and rhabdomyolysis.  -monitor CK -monitor on telemetry   Hypertension Blood pressure 135/105-152/110 while in ed. Current BP 126/92. Outpatient regimen consists of lasix, entresto, metolazone.  - Continue home entresto - IV lasix 160mg  bid - hold metolazone  Type II DM Last A1c 7.2 in august 2018. Most recent CBG of 129. Takes lantus 10U and metformin at home. - hold metformin - lantus 5U daily - Sensitive SSI   Urinary retention 2/2 BPH - Restart home flomax  Seizure disorder Patient has had seizure disorder for around 1 month per history. Has not received daily keppra for at least two weeks but has had no seizures since being off his medications. Will restart home  keppra. Unclear which type of seizure patient has had in past. - keppra 750mg  bid  Protein Calorie  Malnutrition Albumin 2.4 on admission. Has past diagnosis in chart. Supplement meals with ensure shakes. 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 Has not been on eye drops at home for a long period of time.  - F/U with optho as outpatient.  FEN/GI: heart healthy, carb modified.  Prophylaxis: lovenox  Disposition: will likely need SNF placement   Subjective:  Patient today reports no chest pain or shortness of breath. Patient's only concern is right knee pain, patient states "it feels stiff". Patient states he was awake throughout the night and states this is because his room was too cold. When asked why patient stopped taking his medications patient stated he heard a radio ad that said medications can be toxic so he wanted a trial off his medications.   Objective: Temp:  [97.9 F (36.6 C)-98.4 F (36.9 C)] 97.9 F (36.6 C) (10/19 0505) Pulse Rate:  [69-148] 82 (10/19 0646) Resp:  [12-24] 22 (10/19 0505) BP: (126-152)/(92-110) 126/92 (10/19 0505) SpO2:  [88 %-98 %] 96 % (10/19 0505) Weight:  [218 lb 8 oz (99.1 kg)-225 lb 6.4 oz (102.2 kg)] 218 lb 8 oz (99.1 kg) (10/19 0505) Physical Exam: General: awake and alert, sitting up in bed Cardiovascular: regular rhythm, irregular rhythm. No murmurs. Thready left pedal pulse.   Respiratory: CTAB, no wheezes, rales or rhonchi  Abdomen: soft, non tender, distended, bowel sounds present  Extremities: 2+ pitting edema to knee of LLE and from stump of RLE up.  Skin: skin breakdown noted on multiple sites throughout LLE and on right stump, covered by dressing from wound care   Laboratory:  Recent Labs Lab 12/20/16 1550  WBC 6.1  HGB 14.6  HCT 43.2  PLT 231    Recent Labs Lab 12/20/16 1550 12/21/16 0342  NA 137 140  K 4.5 3.8  CL 105  105  CO2 22 26  BUN 24* 27*  CREATININE 1.14 1.25*  CALCIUM 8.7* 8.8*  PROT 6.0*  --   BILITOT 1.9*  --   ALKPHOS 189*  --   ALT 26  --   AST 39  --   GLUCOSE 86 76    Ref. Range 12/20/2016 15:50  B Natriuretic Peptide Latest Ref Range: 0.0 - 100.0 pg/mL 3,536.9 (H)    Ref. Range 12/20/2016 15:50 12/20/2016 17:36 12/20/2016 21:03 12/20/2016 21:40 12/21/2016 03:42  Troponin I Latest Ref Range: <0.03 ng/mL 0.05 (HH)   0.05 (HH) 0.06 (HH)    Imaging/Diagnostic Tests: Dg Chest 2 View  Result Date: 12/20/2016 CLINICAL DATA:  CHF, fluid retention EXAM: CHEST  2 VIEW COMPARISON:  10/15/2016 FINDINGS: Cardiomegaly with minimal central congestion. Small pleural effusions. No focal consolidation. No pneumothorax. IMPRESSION: Cardiomegaly with minimal central congestion. Small bilateral effusions. Electronically Signed   By: Donavan Foil M.D.   On: 12/20/2016 17:38    Caroline More, DO 12/21/2016, 10:37 AM PGY-1, [place] Intern pager: [phone], text pages welcome

## 2016-12-21 NOTE — Progress Notes (Signed)
Family Medicine Teaching Service Daily Progress Note Intern Pager: [phone]  Patient name: [name] Medical record number: [id number] Date of birth: [birth year] Age: 61 y.o. Gender: male  Primary Care Provider: Steve Rattler, DO Consultants: Advanced heart failure  Code Status: Full   Pt Overview and Major Events to Date:  Admitted to [facility] on 12/20/2016  Assessment and Plan: Jake Markin [name] a 61 y.o. male presenting with bilateral lower extremity edema/scrotal edema. 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  CHF exacerbation Has had 2 weeks of bilateral lower extremity edema/scrotal edema, and shortness of breath. BNP on admission elevated to 3536.9. Symptoms consistent with CHF exacerbation. Patient reports 2 week non-compliance with medications, giving likely etiology of exacerbation. Last echo from July 2018 with EF 20-25%, PA pressure 63. Patient without any symptoms of dyspnea aside from a less than one week cough which might be unrelated. Patient is seen by Dr. Jeffie Tucker of advanced heart failure, have consulted while inpatient. Dry weight appears to be ~185lbs per note from 07/15/2015. Most recent measured weight 218lb on 10/19, patient has not been weighed today. Patient having poor urine output even with increased IV lasix, output of 550cc with total intake of 1029.  - Vital signs per floor routine - cardiac monitoring  - strict I/Os - daily weights - pt/ot eval and treat - continue IV Lasix to 160mg  bid - hold home metolazone, consider restarting per HF team  - tylenol prn for pain - continue home bisoprolol (5mg ), home entresto (49/51mg ) - consult advanced heart failure, appreciate recommendations  - hold aspirin 325  Elevated troponin-stable Troponins trended at 0.05>0.05>0.06. Likely  secondary to demand ischemia from decompensated CHF. Per chart review patient has history of elevated troponin, with recent admission in August with similar readings. Mild and flat trend. Patient denies chest pain. Per nursing staff patient has been in normal sinus rhythm on telemetry.  -continue to monitor on telemetry -monitor EKGs   Chronic non-healing wounds on BLE Patient with large non-healing chronic venous stasiswoundson RLE managed as outpatient by [name] and by home health wound care. ABI on 09/2016 showing left arterial flow wnl with difficult right exam Tucker right BKA. Likely worsened by crawling on legs at home.Also has several small developing ulcers on LLE. Will ask wound care to evaluate and provide recommendations for wound care. Patient had dressing presenting from wound care. Wound care recommendations of continued dressings and follow up with [name] after discharge.  - Consult wound care, appreciate recommendations   Found down Patient was found down by home health nurse. Likely 2/2 CHF exacerbation causing increased edema and weakness. Cannot rule out syncopal event. Less likely MI Tucker negative EKG on admission for ST changes. Less likely CVA Tucker no other neurological symptoms. Less likely pneumonia Tucker negative CXR on admission. Will need to consider secondary problems of dehydration, hypothermia, and rhabdomyolysis. CK wnl so unlikely prolonged muscle damage.  -monitor on telemetry   Hypertension Blood pressure 135/105-152/110 while in ed. Current BP 118/92. Outpatient regimen consists of lasix, entresto, metolazone.  - Continue home entresto - IV lasix 160mg  bid - hold metolazone  Type II DM Last A1c 7.2 in august 2018. Most recent CBG of 129. Takes lantus 10U and metformin at home. - hold metformin - lantus 5U daily - Sensitive SSI  Urinary retention 2/2 BPH Bladder scan on 10/20 revealing 387mL  post voiding.  - increase home flomax - in and out  cath  - continue to bladder scan if retaining urine  - consider foley catheter Tucker increased diuretic need  Seizuredisorder Patient has had seizure disorder for around 1 month per history. Has not received daily keppra for at least two weeksbut has had no seizures since being off his medications.Will restart home keppra. Unclear which type of seizure patient has had in past. - keppra 750mg  bid  Protein Calorie Malnutrition Albumin 2.4 on admission. Has past diagnosis in chart. Supplement meals with ensure shakes. 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 Has not been on eye drops at home for a long period of time.  -F/U with optho as outpatient.  FEN/GI: heart healthy, carb modified.  Prophylaxis: lovenox  Disposition: awaiting SNF placement   Subjective:  Patient today reports some knee pain, but states this is a chronic problem. Denies chest pain or SOB. States he feels like he is not adequately urinating and "feels full". Patient states he does not notice a change in his edema. Patient reports that wound care changed dressings 15 min prior to my arrival.   Objective: Temp:  [97.9 F (36.6 C)-98.4 F (36.9 C)] 98.2 F (36.8 C) (10/20 0646) Pulse Rate:  [66-80] 68 (10/20 0646) Resp:  [16-18] 16 (10/20 0646) BP: (118-134)/(78-92) 134/88 (10/20 0646) SpO2:  [94 %-96 %] 94 % (10/20 0646) Weight:  [221 lb 14.4 oz (100.7 kg)] 221 lb 14.4 oz (100.7 kg) (10/20 0700) Physical Exam: General: awake and alert, sitting up in bed, NAD Cardiovascular: RRR, no MRG Respiratory: CTAB, no wheezes, rales, or rhonchi  Abdomen: soft, non tender, distended, bowel sounds present, no fluid wave  Extremities: wrapped up to knee on LLE, unable to assess edema Tucker wrapping  GU: scrotal  edema  Laboratory:  Recent Labs Lab 12/20/16 1550  WBC 6.1  HGB 14.6  HCT 43.2  PLT 231    Recent Labs Lab 12/20/16 1550 12/21/16 0342  NA 137 140  K 4.5 3.8  CL 105 105  CO2 22 26  BUN 24* 27*  CREATININE 1.14 1.25*  CALCIUM 8.7* 8.8*  PROT 6.0*  --   BILITOT 1.9*  --   ALKPHOS 189*  --   ALT 26  --   AST 39  --   GLUCOSE 86 76     Ref. Range 12/21/2016 12:20  CK Total Latest Ref Range: 49 - 397 U/L 274    Imaging/Diagnostic Tests: Dg Chest 2 View  Result Date: 12/20/2016 CLINICAL DATA:  CHF, fluid retention EXAM: CHEST  2 VIEW COMPARISON:  10/15/2016 FINDINGS: Cardiomegaly with minimal central congestion. Small pleural effusions. No focal consolidation. No pneumothorax. IMPRESSION: Cardiomegaly with minimal central congestion. Small bilateral effusions. Electronically Signed   By: Donavan Foil M.D.   On: 12/20/2016 17:38    Caroline More, DO 12/22/2016, 8:02 AM PGY-1, [place] Intern pager: [phone], text pages welcome

## 2016-12-21 NOTE — NC FL2 (Signed)
[place] LEVEL OF CARE SCREENING TOOL     IDENTIFICATION  Patient Name: [name] Birthdate: 12/26/1955 Sex: male Admission Date (Current Location): 12/20/2016  [facility] and [place] Number:  Herbalist and Address:  [facility], [place] [address], [place], [place] [address]      Provider Number: [id number]  Attending Physician Name and Address:  Lind Covert, MD  Relative Name and Phone Number:       Current Level of Care: SNF Recommended Level of Care: [place] Prior Approval Number:    Date Approved/Denied:   PASRR Number: Manual review  Discharge Plan: SNF    Current Diagnoses: Patient Active Problem List   Diagnosis Date Noted  . Pressure injury of skin 12/21/2016  . CHF exacerbation ([place]) 12/20/2016  . Peripheral edema   . Benign prostatic hyperplasia with lower urinary tract symptoms   . Embolic stroke ([place]) 61/60/7371  . Cardiomyopathy, dilated ([place]) 10/19/2016  . Acute on chronic systolic heart failure, NYHA class 3 ([place]) 10/19/2016  . Moderate to severe pulmonary hypertension ([place]) 10/19/2016  . Severe tricuspid regurgitation by prior echocardiogram 10/19/2016  . Diabetes mellitus type 2 in obese ([place]) 10/19/2016  . HLD (hyperlipidemia) 10/19/2016  . Seizure ([place]) 10/19/2016  . Fluid overload 10/14/2016  . 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 ([place]) 10/09/2016  . Cardiorenal syndrome with renal failure 09/28/2016  . Acute urinary retention 09/26/2016  . Acute on chronic combined systolic and diastolic CHF (congestive heart failure) ([place]) 09/26/2016  . Scrotal edema 09/24/2016  . Multiple open wounds of lower leg, initial encounter 09/24/2016  . Tinea of groin 09/24/2016  . Chronic kidney disease (CKD), stage III (moderate) ([place]) 06/07/2016  . Chronic combined systolic and diastolic congestive heart failure ([place])   . Protein  calorie malnutrition ([place]) 07/12/2015  . Congestive dilated cardiomyopathy ([place]) 07/11/2015  . Pressure ulcer 07/09/2015  . Open knee wound 07/06/2015  . History of right below knee amputation ([place]) 02/22/2015  . PAD (peripheral artery disease) ([place]) 02/22/2015  . Blindness of left eye 01/04/2015  . Complications, amputation stump late ([place]) 08/06/2014  . Diabetic neuropathy associated with type 2 diabetes mellitus ([place]) 12/01/2013  . Noncompliance with medications 07/13/2010  . OTHER SPEC TYPES SCHIZOPHRENIA UNSPEC CONDITION 05/24/2009  . Obesity, unspecified 06/09/2007  . DM (diabetes mellitus), type 2, uncontrolled ([place]) 05/02/2006  . Essential hypertension 05/02/2006    Orientation RESPIRATION BLADDER Height & Weight     Self, Time, Situation, Place  Normal Incontinent Weight: 218 lb 8 oz (99.1 kg) Height:  6' (182.9 cm)  BEHAVIORAL SYMPTOMS/MOOD NEUROLOGICAL BOWEL NUTRITION STATUS  Other (Comment) (Calm, flat affect.) Convulsions/Seizures Incontinent Diet (Heart healthy/carb modified)  AMBULATORY STATUS COMMUNICATION OF NEEDS Skin   Extensive Assist Verbally Bruising, Other (Comment), PU Stage and Appropriate Care (Blister, Excoriated, MASD, Rash, Skin tear, Weeping. Unstageable pressure injury: Right upper knee (Foam), Left distal knee (No dressing).)   PU Stage 2 Dressing:  (Left knee: Foam. Left anterior toe: Foam. Left medial Leg: Foam.) PU Stage 3 Dressing:  (Right lower knee: Foam.)                 Personal Care Assistance Level of Assistance  Bathing, Feeding, Dressing Bathing Assistance: Maximum assistance Feeding assistance: Limited assistance Dressing Assistance: Maximum assistance     Functional Limitations Info  Sight, Hearing, Speech Sight Info: Impaired (Blind in left eye, decreased vision in right  eye) Hearing Info: Adequate Speech Info: Adequate    SPECIAL CARE FACTORS FREQUENCY  PT (By licensed PT), OT (By licensed OT)     PT Frequency: 5 x  week OT Frequency: 5 x week            Contractures Contractures Info: Not present    Additional Factors Info  Code Status, Allergies, Psychotropic Code Status Info: Full Allergies Info: NKDA Psychotropic Info: Other specified Schizophrenia: No psychotropic medications.         Current Medications (12/21/2016):  This is the current hospital active medication list Current Facility-Administered Medications  Medication Dose Route Frequency Provider Last Rate Last Dose  . 0.9 %  sodium chloride infusion  250 mL Intravenous PRN Bufford Lope, DO      . acetaminophen (TYLENOL) tablet 650 mg  650 mg Oral Q4H PRN Bufford Lope, DO      . atorvastatin (LIPITOR) tablet 40 mg  40 mg Oral q1800 Orson Eva J, DO      . bisoprolol (ZEBETA) tablet 5 mg  5 mg Oral Daily Orson Eva J, DO   5 mg at 12/21/16 1033  . enoxaparin (LOVENOX) injection 40 mg  40 mg Subcutaneous Q24H Shawna Orleans, Elsia J, DO   40 mg at 12/20/16 2245  . furosemide (LASIX) 160 mg in dextrose 5 % 50 mL IVPB  160 mg Intravenous BID Verner Mould, MD      . insulin aspart (novoLOG) injection 0-9 Units  0-9 Units Subcutaneous TID WC Shawna Orleans, Elsia J, DO      . insulin glargine (LANTUS) injection 5 Units  5 Units Subcutaneous QHS Orson Eva J, DO   5 Units at 12/20/16 2246  . levETIRAcetam (KEPPRA) tablet 750 mg  750 mg Oral BID Orson Eva J, DO   750 mg at 12/21/16 1033  . ondansetron (ZOFRAN) injection 4 mg  4 mg Intravenous Q6H PRN Bufford Lope, DO      . sacubitril-valsartan (ENTRESTO) 49-51 mg per tablet  1 tablet Oral BID Bufford Lope, DO   1 tablet at 12/21/16 1033  . sodium chloride flush (NS) 0.9 % injection 3 mL  3 mL Intravenous Q12H Yoo, Elsia J, DO   3 mL at 12/21/16 1033  . sodium chloride flush (NS) 0.9 % injection 3 mL  3 mL Intravenous PRN Orson Eva J, DO      . tamsulosin (FLOMAX) capsule 0.4 mg  0.4 mg Oral QPC supper Bufford Lope, DO         Discharge Medications: Please see discharge summary for a list of  discharge medications.  Relevant Imaging Results:  Relevant Lab Results:   Additional Information SS#: [id number]. Sister wants LTC after rehab.  Candie Chroman, LCSW

## 2016-12-21 NOTE — Discharge Summary (Signed)
[facility] Discharge Summary  Patient name: [name] Medical record number: [id number] Date of birth: [birth year] Age: 61 y.o. Gender: male Date of Admission: 12/20/2016  Date of Discharge: 12/31/2016 Admitting Physician: Lind Covert, MD  Primary Care Provider: Steve Rattler, DO Consultants: Advanced heart failure   Indication for Hospitalization: CHF exacerbation   Discharge Diagnoses/Problem List:  CHF exacerbation Elevated troponin Chronic non-healing wounds on BLE HTN T2DM Urinary retention 2/2 BPH Seizure disorder Protein calorie malnutrition Hyperlipidemia Cataract in left eye with diabetic retinopathy   Disposition: SNF  Discharge Condition: stable, improving   Discharge Exam:  General: awake and alert, NAD, sitting up in bed  Cardiovascular: RRR, no MRG Respiratory: CTAB, no wheezes, rales, or rhonchi  Abdomen: soft, non tender, non distended, bowel sounds normal  Extremities: right bka, wounds dressed, no edema, non tender   Brief Hospital Course:  [name] is a 61 y.o. male presenting with lower extremity and scrotal swelling that started 2 weeks prior to admission. At this time patient decided to discontinue his medications as he thought he would have a trial off of them. Patient denies dyspnea and did not report any orpthopnea or PND on admission. On admission BNP was elevated to 3536.9. Most recent Echo on 09/2016 showed EF 20-25%, PA pressure 63. Advanced heart failure was consulted who recommended management by FPTS and HF consultation if adequate diuresis is not achieved. Patient was diuresed with IV lasix 160 bid and needed to have a foley placed due to urinary retention, multiple post void residuals showing retention. Patient was put on IV lasix and transitioned to po torsemide prior to discharge. Dry weight appears to be ~207lbs and weight on discharge was 195lbs.   During admission patient had elevated  troponins that were trended and seen be to show a mild and flat trend. These are likely elevated due to demand ischemia from decompensated CHF and renal insufficiency. Similar findings were seen in August during previous admission for CHF exacerbation.   Patient also has history of chronic non-healing wounds on BLE. Patient is managed as outpatient with Dr. Sharol Given and a home health nurse for wound care. Patient was found crawling on legs at home due to increased weakness, and this likely caused a worsening in condition. Wound care was consulted and recommended continued dressings and follow up with Dr. Sharol Given after discharge.   During admission patient's blood pressure was slightly elevated ranging from 135/105-152/110. Patient was continued on his come entresto and had IV lasix and BP improved to 113/80 on discharge.   Issues for Follow Up:  1. Compliance with medications  2. Monitor wounds on BLE 3. Monitor BP 4. Follow up with Dr. 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 Monitor urinary retention  6. Re-check BMP in 24-48hrs  7. Monitor urine output 8. Can titrate lasix dose up as needed to maintain adequate diuresis  9. Discontinued home lantus, metolazone, and KDur  Significant Procedures:  None  Significant Labs and Imaging:  No results for input(s): WBC, HGB, HCT, PLT in the last 168 hours.  Recent Labs Lab 12/27/16 0231 12/28/16 0457 12/29/16 0610 12/30/16 0426 12/31/16 0535  NA 139 138 137 135 136  K 3.3* 3.4* 3.9 3.5 4.5  CL 98* 94* 90* 92* 95*  CO2 33* 35* 38* 36* 33*  GLUCOSE 105* 87 116* 112* 93  BUN 22* 22* 21* 25* 27*  CREATININE 0.93 0.98 1.07 1.12 1.12  CALCIUM 8.2* 8.3* 8.4*  8.2* 8.2*    Ref. Range 12/20/2016 15:50  B Natriuretic Peptide Latest Ref Range: 0.0 - 100.0 pg/mL 3,536.9 (H)    Dg Chest 2 View  Result Date: 12/20/2016 CLINICAL DATA:  CHF, fluid retention EXAM: CHEST  2 VIEW COMPARISON:  10/15/2016 FINDINGS: Cardiomegaly with minimal central  congestion. Small pleural effusions. No focal consolidation. No pneumothorax. IMPRESSION: Cardiomegaly with minimal central congestion. Small bilateral effusions. Electronically Signed   By: Donavan Foil M.D.   On: 12/20/2016 17:38     Results/Tests Pending at Time of Discharge:  Unresulted Labs    None       Discharge Medications:  Allergies as of 12/31/2016   No Known Allergies     Medication List    STOP taking these medications   insulin glargine 100 UNIT/ML injection Commonly known as:  LANTUS   metolazone 2.5 MG tablet Commonly known as:  ZAROXOLYN   potassium chloride SA 20 MEQ tablet Commonly known as:  K-DUR,KLOR-CON     TAKE these medications   aspirin 325 MG tablet Take 1 tablet (325 mg total) by mouth daily.   atorvastatin 40 MG tablet Commonly known as:  LIPITOR Take 1 tablet (40 mg total) by mouth daily.   bacitracin ointment Apply topically 2 (two) times daily.   bisoprolol 5 MG tablet Commonly known as:  ZEBETA Take 1 tablet (5 mg total) by mouth daily.   brimonidine 0.2 % ophthalmic solution Commonly known as:  ALPHAGAN PLACE 1 DROP INTO THE LEFT EYE 2 (TWO) TIMES DAILY.   dorzolamide-timolol 22.3-6.8 MG/ML ophthalmic solution Commonly known as:  COSOPT Place 1 drop into the left eye 2 (two) times daily.   furosemide 40 MG tablet Commonly known as:  LASIX Take 1 tablet (40 mg total) by mouth 2 (two) times daily. What changed:  medication strength  how much to take  how to take this  when to take this  additional instructions   levETIRAcetam 750 MG tablet Commonly known as:  KEPPRA Take 1 tablet (750 mg total) by mouth 2 (two) times daily.   metFORMIN 500 MG tablet Commonly known as:  GLUCOPHAGE Take 1 tablet (500 mg total) by mouth 2 (two) times daily with a meal.   sacubitril-valsartan 49-51 MG Commonly known as:  ENTRESTO Take 1 tablet by mouth 2 (two) times daily.   tamsulosin 0.4 MG Caps capsule Commonly known as:   FLOMAX Take 2 capsules (0.8 mg total) by mouth daily after supper. What changed:  how much to take   vitamin A 10000 UNIT capsule Take 10,000 Units by mouth daily.       Discharge Instructions: Please refer to Patient Instructions section of EMR for full details.  Patient was counseled important signs and symptoms that should prompt return to medical care, changes in medications, dietary instructions, activity restrictions, and follow up appointments.   Follow-Up Appointments:  Contact information for follow-up providers    Caroline More, DO. Go on 01/04/2017.   Specialty:  Family Medicine Contact information: [address] [place] [address] [phone]            Contact information for after-discharge care    Destination    [facility] Follow up.   Specialty:  [place] Why:  @3 :15pm (please arrive at least 15 min early) Contact information: [address] [place] [phone]  Caroline More, DO 12/31/2016, 1:54 PM PGY-1, [place]

## 2016-12-21 NOTE — Evaluation (Signed)
Occupational Therapy Evaluation Patient Details Name: [name] MRN: [id number] DOB: 03-10-1955 Today's Date: 12/21/2016    History of Present Illness 61 y.o. male with PMH HFrEF, pulm HTN, DM, PVD who presented with bilateral lower extremity edema/scrotal edema.   Clinical Impression   Pt admitted with the above diagnoses and presents with below problem list. Pt will benefit from continued acute OT to address the below listed deficits and maximize independence with basic ADLs prior to d/c to venue below. PTA pt was struggling with transfers and OOB/LB ADLs. Pt has become increasing less mobile at home and was noted to resort to crawling to get to w/c and BSC. On eval, pt is +2 max A squat-pivot toilet transfers, max A for LB ADLs in sitting/lateral lean. Pt is from home alone with intermittent assist and agreeable to further rehab at SNF at d/c.     Follow Up Recommendations  SNF    Equipment Recommendations  Other (comment) (defer to next venue)    Recommendations for Other Services       Precautions / Restrictions Precautions Precautions: Fall Restrictions Weight Bearing Restrictions: No      Mobility Bed Mobility Overal bed mobility: Needs Assistance Bed Mobility: Supine to Sit     Supine to sit: Mod assist;Min assist     General bed mobility comments: Increased effort, heavy use of bed rails and momentum. Assist to steady during power up  Transfers Overall transfer level: Needs assistance Equipment used: 2 person hand held assist Transfers: Squat Pivot Transfers     Squat pivot transfers: Max assist;+2 physical assistance     General transfer comment: from EOB to recliner. max +2 squat pivot. towards left side. assist to powerup, steady, and control movement.     Balance Overall balance assessment: Needs assistance Sitting-balance support: Bilateral upper extremity supported;Feet supported Sitting balance-Leahy Scale: Fair                                     ADL either performed or assessed with clinical judgement   ADL Overall ADL's : Needs assistance/impaired Eating/Feeding: Set up;Sitting   Grooming: Set up;Sitting   Upper Body Bathing: Set up;Sitting   Lower Body Bathing: Maximal assistance;Sitting/lateral leans   Upper Body Dressing : Set up;Sitting   Lower Body Dressing: Maximal assistance;Sitting/lateral leans   Toilet Transfer: Maximal assistance;+2 for physical assistance;Squat-pivot;BSC;Requires wide/bariatric   Toileting- Clothing Manipulation and Hygiene: Sitting/lateral lean;Moderate assistance Toileting - Clothing Manipulation Details (indicate cue type and reason): pt able to complete posterior pericare with setup   Tub/Shower Transfer Details (indicate cue type and reason): sponge bath at baseline   General ADL Comments: Pt completed bed mobility, toilet transfer, and pericare as detailed above. Up in recliner at end of session.      Vision Baseline Vision/History: Legally blind;Cataracts Additional Comments: Per pt report legally blind in L eye, cataract surgery and vision deficits in R eye     Perception     Praxis      Pertinent Vitals/Pain Pain Assessment: Faces Faces Pain Scale: Hurts even more Pain Location: scrotal area with movement Pain Descriptors / Indicators: Grimacing;Guarding Pain Intervention(s): Monitored during session;Repositioned;Limited activity within patient's tolerance     Hand Dominance     Extremity/Trunk Assessment Upper Extremity Assessment Upper Extremity Assessment: Overall WFL for tasks assessed;Generalized weakness   Lower Extremity Assessment Lower Extremity Assessment: Defer to PT evaluation RLE Deficits / Details:  R BKA, R knee lacking 10 degrees of extension LLE Deficits / Details: hip and knee ROM WFL        Communication Communication Communication: Expressive difficulties   Cognition Arousal/Alertness: Awake/alert Behavior During  Therapy: WFL for tasks assessed/performed Overall Cognitive Status: Within Functional Limits for tasks assessed                                     General Comments       Exercises     Shoulder Instructions      Home Living Family/patient expects to be discharged to:: Skilled nursing facility Living Arrangements: Alone                               Additional Comments: pt states he washes at sink.  bathroom is not fully accessible to w/c, and he reports he lowers himself to the floor, and crawls to the toilet, then climbs up on it.       Prior Functioning/Environment Level of Independence: Needs assistance  Gait / Transfers Assistance Needed: Primarily uses manual w/c for mobility; w/c does not fit into bathroom, so pt crawls on knees contributing to bilat knee wounds ADL's / Homemaking Assistance Needed: aide assists with cooking/household tasks 3x/wk; sister helps intermittently other days            OT Problem List: Decreased strength;Decreased activity tolerance;Impaired balance (sitting and/or standing);Decreased knowledge of use of DME or AE;Decreased knowledge of precautions;Impaired vision/perception;Obesity;Pain;Increased edema      OT Treatment/Interventions: Self-care/ADL training;Therapeutic exercise;Energy conservation;DME and/or AE instruction;Therapeutic activities;Patient/family education;Balance training    OT Goals(Current goals can be found in the care plan section) Acute Rehab OT Goals Patient Stated Goal: go to rehab OT Goal Formulation: With patient Time For Goal Achievement: 01/04/17 Potential to Achieve Goals: Good ADL Goals Pt Will Perform Grooming: with set-up;sitting (unsupported sitting) Pt Will Perform Upper Body Dressing: with set-up;sitting Pt Will Perform Lower Body Dressing: with mod assist;sitting/lateral leans (+2) Pt Will Transfer to Toilet: with mod assist;with +2 assist;squat pivot transfer Pt Will Perform  Toileting - Clothing Manipulation and hygiene: with min assist;sitting/lateral leans Pt/caregiver will Perform Home Exercise Program: Increased strength;Both right and left upper extremity;With Supervision  OT Frequency: Min 2X/week   Barriers to D/C:            Co-evaluation PT/OT/SLP Co-Evaluation/Treatment: Yes Reason for Co-Treatment: For patient/therapist safety;To address functional/ADL transfers PT goals addressed during session: Mobility/safety with mobility OT goals addressed during session: ADL's and self-care      AM-PAC PT "6 Clicks" Daily Activity     Outcome Measure Help from another person eating meals?: None Help from another person taking care of personal grooming?: None Help from another person toileting, which includes using toliet, bedpan, or urinal?: Total Help from another person bathing (including washing, rinsing, drying)?: A Lot Help from another person to put on and taking off regular upper body clothing?: A Little Help from another person to put on and taking off regular lower body clothing?: A Lot 6 Click Score: 16   End of Session Equipment Utilized During Treatment: Gait belt Nurse Communication: Mobility status  Activity Tolerance: Patient limited by fatigue;Patient limited by pain;Patient tolerated treatment well Patient left: in chair;with call bell/phone within reach;with chair alarm set  OT Visit Diagnosis: Unsteadiness on feet (R26.81);Pain;Muscle weakness (generalized) (M62.81);Other abnormalities of gait  and mobility (R26.89)                Time: 4859-2763 OT Time Calculation (min): 37 min Charges:  OT General Charges $OT Visit: 1 Visit OT Evaluation $OT Eval Moderate Complexity: 1 Mod G-Codes:       Hortencia Pilar 12/21/2016, 11:47 AM

## 2016-12-22 LAB — GLUCOSE, CAPILLARY
Glucose-Capillary: 54 mg/dL — ABNORMAL LOW (ref 65–99)
Glucose-Capillary: 105 mg/dL — ABNORMAL HIGH (ref 65–99)
Glucose-Capillary: 106 mg/dL — ABNORMAL HIGH (ref 65–99)
Glucose-Capillary: 98 mg/dL (ref 65–99)
Glucose-Capillary: 133 mg/dL — ABNORMAL HIGH (ref 65–99)

## 2016-12-22 LAB — BASIC METABOLIC PANEL
Anion gap: 6 (ref 5–15)
BUN: 27 mg/dL — ABNORMAL HIGH (ref 6–20)
CO2: 29 mmol/L (ref 22–32)
Calcium: 8.5 mg/dL — ABNORMAL LOW (ref 8.9–10.3)
Chloride: 104 mmol/L (ref 101–111)
Creatinine, Ser: 1.21 mg/dL (ref 0.61–1.24)
GFR calc Af Amer: >60 mL/min (ref >60)
GFR calc non Af Amer: >60 mL/min (ref >60)
Glucose, Bld: 61 mg/dL — ABNORMAL LOW (ref 65–99)
Potassium: 3.6 mmol/L (ref 3.5–5.1)
Sodium: 139 mmol/L (ref 135–145)

## 2016-12-22 MED ORDER — TAMSULOSIN HCL 0.4 MG PO CAPS
0.8000 mg | ORAL_CAPSULE | Freq: Every day | ORAL | Status: DC
  Administered 2016-12-22 – 2016-12-30 (×9): 0.8 mg via ORAL
  Filled 2016-12-22 (×9): qty 2

## 2016-12-22 NOTE — Progress Notes (Signed)
MD paged about bladder scan, wants in and out cath done.

## 2016-12-22 NOTE — Progress Notes (Signed)
Dressing change on both lower extremities done this morning. Gauze were soaked with drainage.

## 2016-12-22 NOTE — Progress Notes (Signed)
MD came to visit patient. Patient expressed to RN that he was taking flomax during last admission and it helped him. Bladder scan done- 433mL, patient urinated, 344mL.

## 2016-12-23 LAB — GLUCOSE, CAPILLARY
Glucose-Capillary: 57 mg/dL — ABNORMAL LOW (ref 65–99)
Glucose-Capillary: 63 mg/dL — ABNORMAL LOW (ref 65–99)
Glucose-Capillary: 107 mg/dL — ABNORMAL HIGH (ref 65–99)
Glucose-Capillary: 197 mg/dL — ABNORMAL HIGH (ref 65–99)
Glucose-Capillary: 127 mg/dL — ABNORMAL HIGH (ref 65–99)

## 2016-12-23 LAB — BASIC METABOLIC PANEL
Anion gap: 6 (ref 5–15)
BUN: 26 mg/dL — ABNORMAL HIGH (ref 6–20)
CO2: 28 mmol/L (ref 22–32)
Calcium: 8.3 mg/dL — ABNORMAL LOW (ref 8.9–10.3)
Chloride: 104 mmol/L (ref 101–111)
Creatinine, Ser: 1.11 mg/dL (ref 0.61–1.24)
GFR calc Af Amer: >60 mL/min (ref >60)
GFR calc non Af Amer: >60 mL/min (ref >60)
Glucose, Bld: 70 mg/dL (ref 65–99)
Potassium: 3.4 mmol/L — ABNORMAL LOW (ref 3.5–5.1)
Sodium: 138 mmol/L (ref 135–145)

## 2016-12-23 NOTE — Progress Notes (Signed)
Family Medicine Teaching Service Daily Progress Note Intern Pager: [phone]  Patient name: [name] Medical record number: [id number] Date of birth: 14-Dec-1955 Age: 61 y.o. Gender: male  Primary Care Provider: Steve Rattler, DO Consultants: Advanced heart failure  Code Status: full   Pt Overview and Major Events to Date:  Admitted to [facility] on 12/20/2016  Assessment and Plan: Jake Shouse [name] a 61 y.o. male presenting with bilateral lower extremity edema/scrotal edema. 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  CHF exacerbation Has had 2 weeks of bilateral lower extremity edema/scrotal edema, and shortness of breath. BNP on admission elevated to 3536.9. Symptoms consistent with CHF exacerbation. Patient reports 2 week non-compliance with medications, giving likely etiology of exacerbation. Last echo from July 2018 with EF20-25%, PA pressure 63. Patient without any symptoms of dyspnea aside from a less than one week cough which might be unrelated. Patient is seen by Dr. Jeffie Pollock of advanced heart failure, have consulted while inpatient. Dry weight appears to be ~185lbs per note from 07/15/2015. Most recent measured weight 219lbs, down from 221lbs on 10/20.  Urine output slightly improving with total output 1.6L. Patient today notes he has increased urine output. Edema noted on exam.  - Vital signs per floor routine - cardiac monitoring  - strict I/Os - daily weights - pt/ot eval and treat - consult advanced heart failure, appreciate recommendations  - continue IV Lasix to 160mg  bid - hold home metolazone, consider restarting per HF team  - tylenol prn for pain - continue home bisoprolol (5mg ), home entresto (49/51mg ) - hold aspirin 325  Elevated troponin-stable Troponins trended at 0.05>0.05>0.06. Likely  secondary to demand ischemia from decompensated CHF. Per chart review patient has history of elevated troponin, with recent admission in August with similar readings. Mild and flat trend. Patient denies chest pain. Per nursing staff patient has been in normal sinus rhythm on telemetry.  -continue to monitor on telemetry -monitor EKGs  Chronic non-healing wounds on BLE Patient with large non-healing chronic venous stasiswoundson RLE managed as outpatient by Dr. Sharol Given and by home health wound care. ABI on 09/2016 showing left arterial flow wnl with difficult right exam given right BKA. Likely worsened by crawling on legs at home.Also has several small developing ulcers on LLE. Wound care currently seeing patient. Wound care recommendations of continued dressings and follow up with Dr. Sharol Given after discharge.  - Consult wound care, appreciate recommendations   Found down Patient was found down by home health nurse. Likely 2/2 CHF exacerbation causing increased edema and weakness. Cannot rule out syncopal event. Less likely MI given negative EKG on admission for ST changes. Less likely CVA given no other neurological symptoms. Less likely pneumonia given negative CXR on admission. Will need to consider secondary problems of dehydration, hypothermia, and rhabdomyolysis. CK wnl so unlikely prolonged muscle damage.  -monitor on telemetry  -PT/OT recommending SNF placement   Hypertension Blood pressure 135/105-152/110 while in ed. Current BP 131/92. Outpatient regimen consists of lasix, entresto, metolazone.  - Continue home entresto - IV lasix 160mg  bid - hold metolazone  Type II DM Last A1c 7.2 in august 2018. Most recent CBG of 133. Takes lantus 10U andmetformin at home. - hold metformin - lantus 5U daily - Sensitive SSI  Urinary retention 2/2 BPH Bladder scan on 10/20 revealing 35mL post voiding.  - continue increased flomax dose of 0.8mg  daily  -  continue to bladder scan if retaining  urine  - consider foley catheter given increased diuretic need  Seizuredisorder Patient has had seizure disorder for around 1 month per history. Has not received daily keppra for at least two weeksbut has had no seizures since being off his medications.Will restart home keppra. Unclear which type of seizure patient has had in past. - keppra 750mg  bid  Protein Calorie Malnutrition Albumin 2.4 on admission.Has past diagnosis in chart. Supplement meals with ensure shakes. 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 Has not been on eye drops at home for a long period of time.  -F/U with optho as outpatient.  FEN/GI: heart healthy, carb modified.  Prophylaxis: lovenox  Disposition: SNF   Subjective:  Patient today with no complains. States he is "peeing ok" and denies pain. No CP or SOB.   Objective: Temp:  [97.7 F (36.5 C)-98.4 F (36.9 C)] 98.4 F (36.9 C) (10/21 0620) Pulse Rate:  [63-74] 74 (10/21 0620) Resp:  [18-20] 18 (10/21 0620) BP: (121-131)/(90-95) 122/90 (10/21 0620) SpO2:  [94 %-96 %] 96 % (10/21 0620) Weight:  [219 lb 8 oz (99.6 kg)] 219 lb 8 oz (99.6 kg) (10/21 7741) Physical Exam: General: awake and alert, sitting up in bed, NAD Cardiovascular: RRR, no MRG Respiratory: CTAB, no wheezes, rales, or rhonchi  Abdomen: soft, non tender, distended, bowel sounds normal  Extremities: wrapped in dressings by wound care, edema noted to knee, right leg BKA GU: scrotal edema, condom cath in place Laboratory:  Recent Labs Lab 12/20/16 1550  WBC 6.1  HGB 14.6  HCT 43.2  PLT 231    Recent Labs Lab 12/20/16 1550 12/21/16 0342 12/22/16 0607  NA 137 140 139  K 4.5 3.8 3.6  CL 105 105 104  CO2 22 26 29   BUN 24* 27* 27*  CREATININE 1.14 1.25* 1.21  CALCIUM 8.7*  8.8* 8.5*  PROT 6.0*  --   --   BILITOT 1.9*  --   --   ALKPHOS 189*  --   --   ALT 26  --   --   AST 39  --   --   GLUCOSE 86 76 61*   CBG (last 3)   Recent Labs  12/22/16 1115 12/22/16 1637 12/22/16 2053  GLUCAP 106* 98 133*     Imaging/Diagnostic Tests: Dg Chest 2 View  Result Date: 12/20/2016 CLINICAL DATA:  CHF, fluid retention EXAM: CHEST  2 VIEW COMPARISON:  10/15/2016 FINDINGS: Cardiomegaly with minimal central congestion. Small pleural effusions. No focal consolidation. No pneumothorax. IMPRESSION: Cardiomegaly with minimal central congestion. Small bilateral effusions. Electronically Signed   By: Donavan Foil M.D.   On: 12/20/2016 17:38    Caroline More, DO 12/23/2016, 7:25 AM PGY-1, [place] Intern pager: [phone], text pages welcome

## 2016-12-23 NOTE — Progress Notes (Signed)
Patient was up most of the night, may benefit from something to help him relax at bedtime.

## 2016-12-23 NOTE — Progress Notes (Signed)
On call for family medicine contacted to request to hold lantus d/t low CBGs given order to hold.

## 2016-12-23 NOTE — Plan of Care (Signed)
Problem: Safety: Goal: Ability to remain free from injury will improve Outcome: Progressing Bed in low position, call light in reach, pt calls appropriately for assistance.

## 2016-12-24 LAB — CBC
HCT: 42.1 % (ref 39.0–52.0)
Hemoglobin: 13.2 g/dL (ref 13.0–17.0)
MCH: 25.8 pg — ABNORMAL LOW (ref 26.0–34.0)
MCHC: 31.4 g/dL (ref 30.0–36.0)
MCV: 82.2 fL (ref 78.0–100.0)
Platelets: 306 10*3/uL (ref 150–400)
RBC: 5.12 MIL/uL (ref 4.22–5.81)
RDW: 18.3 % — ABNORMAL HIGH (ref 11.5–15.5)
WBC: 6.2 10*3/uL (ref 4.0–10.5)

## 2016-12-24 LAB — BASIC METABOLIC PANEL
Anion gap: 8 (ref 5–15)
BUN: 26 mg/dL — ABNORMAL HIGH (ref 6–20)
CO2: 31 mmol/L (ref 22–32)
Calcium: 8.1 mg/dL — ABNORMAL LOW (ref 8.9–10.3)
Chloride: 100 mmol/L — ABNORMAL LOW (ref 101–111)
Creatinine, Ser: 1.17 mg/dL (ref 0.61–1.24)
GFR calc Af Amer: >60 mL/min (ref >60)
GFR calc non Af Amer: >60 mL/min (ref >60)
Glucose, Bld: 105 mg/dL — ABNORMAL HIGH (ref 65–99)
Potassium: 3.7 mmol/L (ref 3.5–5.1)
Sodium: 139 mmol/L (ref 135–145)

## 2016-12-24 LAB — GLUCOSE, CAPILLARY
Glucose-Capillary: 92 mg/dL (ref 65–99)
Glucose-Capillary: 100 mg/dL — ABNORMAL HIGH (ref 65–99)
Glucose-Capillary: 143 mg/dL — ABNORMAL HIGH (ref 65–99)
Glucose-Capillary: 92 mg/dL (ref 65–99)
Glucose-Capillary: 224 mg/dL — ABNORMAL HIGH (ref 65–99)

## 2016-12-24 NOTE — Clinical Social Work Note (Addendum)
Patient does not have any bed offers yet.  [name], [place] [phone]  2:32 pm Patient has one bed offer from [facility]. CSW called to notify patient's sister, Ms. Helene Kelp. She will discuss with her sister.  [name], [name]

## 2016-12-24 NOTE — Progress Notes (Signed)
Physical Therapy Treatment Patient Details Name: [name] MRN: [id number] DOB: 1956/02/10 Today's Date: 12/24/2016    History of Present Illness 61 y.o. male with PMH HFrEF, pulm HTN, DM, PVD who presented with bilateral lower extremity edema/scrotal edema.    PT Comments    Pt is making progress towards his goals. Pt L LE strength is improving however he still has difficulty coming all the way to upright even with RW and modAx2. Pt requires maxA for squat pivot transfer from bed to recliner. Pt requires skilled PT to progress mobility and improve strength and endurance to safely navigate their discharge environment.    Follow Up Recommendations  SNF     Equipment Recommendations   (to be determined at next venue)    Recommendations for Other Services       Precautions / Restrictions Precautions Precautions: Fall Restrictions Weight Bearing Restrictions: No    Mobility  Bed Mobility Overal bed mobility: Needs Assistance Bed Mobility: Supine to Sit     Supine to sit: Min assist     General bed mobility comments: minA for bringing trunk to upright, heavy use of bed rail to bring self to EoB  Transfers Overall transfer level: Needs assistance Equipment used: 1 person hand held assist;Rolling walker (2 wheeled) Transfers: Sit to/from [employer] Sit to Stand: Mod assist;+2 physical assistance   Squat pivot transfers: Max assist;From elevated surface     General transfer comment: 2x sit>stand with RW with modAx2 for working on L leg strength, maxAx1 for squat pivot transfer from EOB to recliner, transfer to L side, prior to movement took L hand and placed on armrail of recliner so pt could judge distance due to limited sight, pt with smooth steady transition to recliner       Balance Overall balance assessment: Needs assistance Sitting-balance support: Bilateral upper extremity supported;Feet supported Sitting balance-Leahy Scale: Fair      Standing balance support: Bilateral upper extremity supported Standing balance-Leahy Scale: Poor Standing balance comment: requires modAx2 and B UE support                            Cognition Arousal/Alertness: Awake/alert Behavior During Therapy: WFL for tasks assessed/performed Overall Cognitive Status: Within Functional Limits for tasks assessed                                           General Comments General comments (skin integrity, edema, etc.): VSS throughout session      Pertinent Vitals/Pain Pain Assessment: Faces Faces Pain Scale: Hurts a little bit Pain Location: scrotal area with movement Pain Descriptors / Indicators: Grimacing;Guarding Pain Intervention(s): Limited activity within patient's tolerance;Monitored during session           PT Goals (current goals can now be found in the care plan section) Acute Rehab PT Goals Patient Stated Goal: go to rehab PT Goal Formulation: With patient Time For Goal Achievement: 01/04/17 Potential to Achieve Goals: Fair Progress towards PT goals: Progressing toward goals    Frequency    Min 2X/week      PT Plan Current plan remains appropriate    Co-evaluation PT/OT/SLP Co-Evaluation/Treatment: Yes            AM-PAC PT "6 Clicks" Daily Activity  Outcome Measure  Difficulty turning over in bed (including adjusting bedclothes, sheets and blankets)?:  Unable Difficulty moving from lying on back to sitting on the side of the bed? : Unable Difficulty sitting down on and standing up from a chair with arms (e.g., wheelchair, bedside commode, etc,.)?: Unable Help needed moving to and from a bed to chair (including a wheelchair)?: Total Help needed walking in hospital room?: Total Help needed climbing 3-5 steps with a railing? : Total 6 Click Score: 6    End of Session Equipment Utilized During Treatment: Gait belt Activity Tolerance: Patient limited by pain Patient left: in  chair;with chair alarm set;with call bell/phone within reach Nurse Communication: Mobility status PT Visit Diagnosis: Unsteadiness on feet (R26.81);Other abnormalities of gait and mobility (R26.89);Repeated falls (R29.6);Muscle weakness (generalized) (M62.81);History of falling (Z91.81);Difficulty in walking, not elsewhere classified (R26.2);Pain Pain - Right/Left: Left Pain - part of body: Leg     Time: 0233-4356 PT Time Calculation (min) (ACUTE ONLY): 16 min  Charges:  $Therapeutic Activity: 8-22 mins                    G Codes:       Elizabeth B. Migdalia Dk PT, DPT Acute Rehabilitation  [phone] Pager [phone]     [place] 12/24/2016, 3:47 PM

## 2016-12-24 NOTE — Progress Notes (Signed)
Family Medicine Teaching Service Daily Progress Note Intern Pager: [phone]  Patient name: [name] Medical record number: [id number] Date of birth: [birth year] Age: 61 y.o. Gender: male  Primary Care Provider: Steve Rattler, DO Consultants: Advanced heart failure  Code Status: Full   Pt Overview and Major Events to Date:  Admitted to [facility] on 12/20/2016  Assessment and Plan: [name] a 61 y.o. male presenting with bilateral lower extremity edema/scrotal edema. 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  CHF exacerbation Has had 2 weeks of bilateral lower extremity edema/scrotal edema, and shortness of breath. BNP on admission elevated to 3536.9. Symptoms consistent with CHF exacerbation. Patient reports 2 week non-compliance with medications, giving likely etiology of exacerbation. Last echo from July 2018 with EF20-25%, PA pressure 63. Patient without any symptoms of dyspnea aside from a less than one week cough which might be unrelated. Patient is seen by Dr. Jeffie Pollock of advanced heart failure, have consulted while inpatient. Dry weight appears to be ~185lbs per note from 07/15/2015. Most recent measured weight 214lbs, stable from 10/22. Urine output decreased with total output -1.5L. Edema noted on exam, but improved.  - Vital signs per floor routine - cardiac monitoring  - strict I/Os - daily weights - pt/ot eval and treat - recommend SNF placement - consult advanced heart failure, appreciate recommendations. Have stated they do not plan to see patient unless adequate diuresis is not achieved  - continue IV Lasix to 160mg  bid - hold home metolazone - tylenol prn for pain - continue home bisoprolol (5mg ), home entresto (49/51mg ) - hold aspirin 325  Elevated troponin-stable Troponins trended at  0.05>0.05>0.06. Likely secondary to demand ischemia from decompensated CHF. Per chart review patient has history of elevated troponin, with recent admission in August with similar readings. Mild and flat trend. Patient denies chest pain.  -continue to monitor on telemetry -monitor EKGs  Chronic non-healing wounds on BLE Patient with large non-healing chronic venous stasiswoundson RLE managed as outpatient by Dr. Sharol Given and by home health wound care. ABI on 09/2016 showing left arterial flow wnl with difficult right exam given right BKA. Likely worsened by crawling on legs at home.Also has several small developing ulcers on LLE. Wound care currently seeing patient. Wound care recommendations of continued dressings and follow up with Dr. Sharol Given after discharge.  - Consult wound care, appreciate recommendations   Found down Patient was found down by home health nurse. Likely 2/2 CHF exacerbation causing increased edema and weakness. Cannot rule out syncopal event. Less likely MI given negative EKG on admission for ST changes. Less likely CVA given no other neurological symptoms. Less likely pneumonia given negative CXR on admission. Will need to consider secondary problems of dehydration, hypothermia, and rhabdomyolysis. CK wnl so unlikely prolonged muscle damage.  -monitor on telemetry  -PT/OT recommending SNF placement   Hypertension Blood pressure 135/105-152/110 while in ed. Current BP 115/80. Outpatient regimen consists of lasix, entresto, metolazone.  - Continue home entresto - IV lasix 160mg  bid - hold metolazone  Type II DM Last A1c 7.2 in august 2018. AM CBG of 68. Takes lantus 10U andmetformin at home. - hold metformin - discontinue lantus given lower CBG readings - Sensitive SSI  Urinary retention 2/2 BPH Bladder scan on 10/23 revealing 530mL post voiding.  - continue increased flomax dose of 0.8mg  daily  - continue to bladder scan if retaining  urine  - consider foley  catheter given increased diuretic need - in & out cath  - will obtain post-void residual   Seizuredisorder Patient has had seizure disorder for around 1 month per history. Has not received daily keppra for at least two weeksbut has had no seizures since being off his medications.Will restart home keppra. Unclear which type of seizure patient has had in past. - keppra 750mg  bid  Protein Calorie Malnutrition Albumin 2.4 on admission.Has past diagnosis in chart. Supplement meals with ensure shakes. 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 Has not been on eye drops at home for a long period of time.  -F/U with optho as outpatient.  FEN/GI: heart healthy, carb modified.  Prophylaxis: lovenox  Disposition: SNF  Subjective:  Patient today with no complaints. States no CP, SOB, or extremity pain. States he is urinating well.   Objective: Temp:  [97.8 F (36.6 C)-98.4 F (36.9 C)] 97.8 F (36.6 C) (10/23 1155) Pulse Rate:  [63-70] 63 (10/23 0550) Resp:  [18-20] 20 (10/23 1155) BP: (109-120)/(68-80) 120/79 (10/23 1155) SpO2:  [95 %-97 %] 95 % (10/23 1155) Weight:  [214 lb 12.8 oz (97.4 kg)] 214 lb 12.8 oz (97.4 kg) (10/23 0550) Physical Exam: General: awake and alert, NAD, sitting up in bed  Cardiovascular: RRR, no MRG Respiratory: CTAB, no wheezes, rales or rhonchi  Abdomen: soft, non tender, distended, bowel sounds normal  Extremities: minimal pitting edema above dressings, lower extremities with dressings from 10/23, Right BKA GU: scrotal edema, improved, condom cath in place   Laboratory:  Recent Labs Lab 12/20/16 1550 12/24/16 0629  WBC 6.1 6.2  HGB 14.6 13.2  HCT 43.2 42.1  PLT 231 306    Recent Labs Lab 12/20/16 1550  12/23/16 0655 12/24/16 0629 12/25/16 0518   NA 137  < > 138 139 138  K 4.5  < > 3.4* 3.7 4.9  CL 105  < > 104 100* 102  CO2 22  < > 28 31 29   BUN 24*  < > 26* 26* 25*  CREATININE 1.14  < > 1.11 1.17 1.09  CALCIUM 8.7*  < > 8.3* 8.1* 7.9*  PROT 6.0*  --   --   --   --   BILITOT 1.9*  --   --   --   --   ALKPHOS 189*  --   --   --   --   ALT 26  --   --   --   --   AST 39  --   --   --   --   GLUCOSE 86  < > 70 105* 74  < > = values in this interval not displayed.   Imaging/Diagnostic Tests: Dg Chest 2 View  Result Date: 12/20/2016 CLINICAL DATA:  CHF, fluid retention EXAM: CHEST  2 VIEW COMPARISON:  10/15/2016 FINDINGS: Cardiomegaly with minimal central congestion. Small pleural effusions. No focal consolidation. No pneumothorax. IMPRESSION: Cardiomegaly with minimal central congestion. Small bilateral effusions. Electronically Signed   By: Donavan Foil M.D.   On: 12/20/2016 17:38     Caroline More, DO 12/25/2016, 1:25 PM PGY-1, [place] Intern pager: [phone], text pages welcome

## 2016-12-24 NOTE — Progress Notes (Signed)
Patient is very well known to me through HF Community Paramedicine involvement and AHF Clinic visits.  [name] has a history of noncompliance with taking medications.  He often tells the visiting Paramedic that he feels he does "not need" all the medications that are prescribed and even after pill box fill refuses to take prescribed medications.  His living situation is not desirable and the Paramedic team questions his ability to live alone and care for himself adequately.  Patient will continue to be seen in the AHF Clinic and followed by the multidisciplinary HF Community Paramedicine Program after discharge.

## 2016-12-24 NOTE — Progress Notes (Signed)
Family Medicine Teaching Service Daily Progress Note Intern Pager: [phone]  Patient name: [name] Medical record number: [id number] Date of birth: 1955-12-24 Age: 61 y.o. Gender: male  Primary Care Provider: Steve Rattler, DO Consultants: Advanced heart failure  Code Status: full   Pt Overview and Major Events to Date:  Admitted to [facility] on 12/20/2016  Assessment and Plan: Jake Sauceda [name] a 61 y.o. male presenting with bilateral lower extremity edema/scrotal edema. 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  CHF exacerbation Has had 2 weeks of bilateral lower extremity edema/scrotal edema, and shortness of breath. BNP on admission elevated to 3536.9. Symptoms consistent with CHF exacerbation. Patient reports 2 week non-compliance with medications, giving likely etiology of exacerbation. Last echo from July 2018 with EF20-25%, PA pressure 63. Patient without any symptoms of dyspnea aside from a less than one week cough which might be unrelated. Patient is seen by Dr. Jeffie Pollock of advanced heart failure, have consulted while inpatient. Dry weight appears to be ~185lbs per note from 07/15/2015. Most recent measured weight 214lbs, down from 219lbs on 10/21.  Urine output slightly improving with total output -3.2L. Edema noted on exam, but improved from yesterday.  - Vital signs per floor routine - cardiac monitoring  - strict I/Os - daily weights - pt/ot eval and treat - recommend SNF placement - consult advanced heart failure, appreciate recommendations. Have stated they do not plan to see patient unless adequate diuresis is not achieved  - continue IV Lasix to 160mg  bid - hold home metolazone, consider restarting per HF team  - tylenol prn for pain - continue home bisoprolol (5mg ), home entresto (49/51mg ) -  hold aspirin 325  Elevated troponin-stable Troponins trended at 0.05>0.05>0.06. Likely secondary to demand ischemia from decompensated CHF. Per chart review patient has history of elevated troponin, with recent admission in August with similar readings. Mild and flat trend. Patient denies chest pain. Per nursing staff patient has been in normal sinus rhythm on telemetry.  -continue to monitor on telemetry -monitor EKGs  Chronic non-healing wounds on BLE Patient with large non-healing chronic venous stasiswoundson RLE managed as outpatient by Dr. Sharol Given and by home health wound care. ABI on 09/2016 showing left arterial flow wnl with difficult right exam given right BKA. Likely worsened by crawling on legs at home.Also has several small developing ulcers on LLE. Wound care currently seeing patient. Wound care recommendations of continued dressings and follow up with Dr. Sharol Given after discharge.  - Consult wound care, appreciate recommendations   Found down Patient was found down by home health nurse. Likely 2/2 CHF exacerbation causing increased edema and weakness. Cannot rule out syncopal event. Less likely MI given negative EKG on admission for ST changes. Less likely CVA given no other neurological symptoms. Less likely pneumonia given negative CXR on admission. Will need to consider secondary problems of dehydration, hypothermia, and rhabdomyolysis. CK wnl so unlikely prolonged muscle damage.  -monitor on telemetry  -PT/OT recommending SNF placement   Hypertension Blood pressure 135/105-152/110 while in ed. Current BP 127/83. Outpatient regimen consists of lasix, entresto, metolazone.  - Continue home entresto - IV lasix 160mg  bid - hold metolazone  Type II DM Last A1c 7.2 in august 2018. Most recent CBG of 100. Takes lantus 10U andmetformin at home. - hold metformin - lantus 5U daily - Sensitive SSI  Urinary retention 2/2 BPH Bladder scan on  10/20 revealing 325mL post voiding.   - continue increased flomax dose of 0.8mg  daily  - continue to bladder scan if retaining urine  - consider foley catheter given increased diuretic need  Seizuredisorder Patient has had seizure disorder for around 1 month per history. Has not received daily keppra for at least two weeksbut has had no seizures since being off his medications.Will restart home keppra. Unclear which type of seizure patient has had in past. - keppra 750mg  bid  Protein Calorie Malnutrition Albumin 2.4 on admission.Has past diagnosis in chart. Supplement meals with ensure shakes. 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 Has not been on eye drops at home for a long period of time.  -F/U with optho as outpatient.  FEN/GI: heart healthy, carb modified.  Prophylaxis: lovenox  Disposition: SNF  Subjective:  Patient today with no complaints. States he has no CP, SOB, or extremity pain. States he is unsure if he is urinating more. Per nursing, patient is much more improved than admission.   Objective: Temp:  [97.6 F (36.4 C)-97.9 F (36.6 C)] 97.9 F (36.6 C) (10/22 0518) Pulse Rate:  [63-70] 63 (10/22 0518) Resp:  [16-18] 16 (10/22 0518) BP: (120-134)/(79-94) 127/83 (10/22 0518) SpO2:  [94 %-97 %] 95 % (10/22 0518) Weight:  [214 lb 14.4 oz (97.5 kg)] 214 lb 14.4 oz (97.5 kg) (10/22 0427)   Intake/Output Summary (Last 24 hours) at 12/24/16 1314 Last data filed at 12/24/16 0904  Gross per 24 hour  Intake             1192 ml  Output             2450 ml  Net            -1258 ml   Physical Exam: General: awake, and alert, NAD, laying in bed  Cardiovascular: RRR, no MRG Respiratory: CTAB, no wheezes, rales, or rhonchi  Abdomen: soft, non tender, distended  Extremities: wrapped by wound care on 10/22,  slight edema above wrapping but improved from 10/21.  GU: scrotal edema but improved from 10/21  Laboratory:  Recent Labs Lab 12/20/16 1550 12/24/16 0629  WBC 6.1 6.2  HGB 14.6 13.2  HCT 43.2 42.1  PLT 231 306    Recent Labs Lab 12/20/16 1550  12/22/16 0607 12/23/16 0655 12/24/16 0629  NA 137  < > 139 138 139  K 4.5  < > 3.6 3.4* 3.7  CL 105  < > 104 104 100*  CO2 22  < > 29 28 31   BUN 24*  < > 27* 26* 26*  CREATININE 1.14  < > 1.21 1.11 1.17  CALCIUM 8.7*  < > 8.5* 8.3* 8.1*  PROT 6.0*  --   --   --   --   BILITOT 1.9*  --   --   --   --   ALKPHOS 189*  --   --   --   --   ALT 26  --   --   --   --   AST 39  --   --   --   --   GLUCOSE 86  < > 61* 70 105*  < > = values in this interval not displayed.   Imaging/Diagnostic Tests: Dg Chest 2 View  Result Date: 12/20/2016  CLINICAL DATA:  CHF, fluid retention EXAM: CHEST  2 VIEW COMPARISON:  10/15/2016 FINDINGS: Cardiomegaly with minimal central congestion. Small pleural effusions. No focal consolidation. No pneumothorax. IMPRESSION: Cardiomegaly with minimal central congestion. Small bilateral effusions. Electronically Signed   By: Donavan Foil M.D.   On: 12/20/2016 17:38     Caroline More, DO 12/24/2016, 1:14 PM PGY-1, [place] Intern pager: [phone], text pages welcome

## 2016-12-25 ENCOUNTER — Ambulatory Visit: Payer: Medicaid Other | Admitting: Cardiovascular Disease

## 2016-12-25 LAB — BASIC METABOLIC PANEL
Anion gap: 7 (ref 5–15)
BUN: 25 mg/dL — ABNORMAL HIGH (ref 6–20)
CO2: 29 mmol/L (ref 22–32)
Calcium: 7.9 mg/dL — ABNORMAL LOW (ref 8.9–10.3)
Chloride: 102 mmol/L (ref 101–111)
Creatinine, Ser: 1.09 mg/dL (ref 0.61–1.24)
GFR calc Af Amer: >60 mL/min (ref >60)
GFR calc non Af Amer: >60 mL/min (ref >60)
Glucose, Bld: 74 mg/dL (ref 65–99)
Potassium: 4.9 mmol/L (ref 3.5–5.1)
Sodium: 138 mmol/L (ref 135–145)

## 2016-12-25 LAB — GLUCOSE, CAPILLARY
Glucose-Capillary: 57 mg/dL — ABNORMAL LOW (ref 65–99)
Glucose-Capillary: 68 mg/dL (ref 65–99)
Glucose-Capillary: 76 mg/dL (ref 65–99)
Glucose-Capillary: 110 mg/dL — ABNORMAL HIGH (ref 65–99)
Glucose-Capillary: 156 mg/dL — ABNORMAL HIGH (ref 65–99)

## 2016-12-25 NOTE — Clinical Social Work Note (Addendum)
CSW faxed requested documentation to [place] [facility] for PASARR review.  Dayton Scrape, [place]  12:17 pm PASARR is under Level II review.  Dayton Scrape, [place]

## 2016-12-25 NOTE — Progress Notes (Addendum)
Occupational Therapy Treatment Patient Details Name: [name] MRN: [id number] DOB: Sep 01, 1955 Today's Date: 12/25/2016    History of present illness 61 y.o. male with PMH HFrEF, pulm HTN, DM, PVD who presented with bilateral lower extremity edema/scrotal edema.   OT comments  Pt demonstrating progress toward OT goals. He was able to sit at EOB without back support to complete grooming and UB bathing tasks with set-up this session demonstrating improved independence and tolerance for activity. Facilitated improved B UE strength with strengthening activities this session. Continue to feel that pt would benefit from SNF level rehabilitation post-acute D/C.    Follow Up Recommendations  SNF    Equipment Recommendations  Other (comment) (defer to next venue of care)    Recommendations for Other Services      Precautions / Restrictions Precautions Precautions: Fall Restrictions Weight Bearing Restrictions: No       Mobility Bed Mobility Overal bed mobility: Needs Assistance Bed Mobility: Sit to Supine       Sit to supine: Min assist   General bed mobility comments: Min assist to return B LE to bed.   Transfers                      Balance Overall balance assessment: Needs assistance Sitting-balance support: Bilateral upper extremity supported;Feet supported Sitting balance-Leahy Scale: Fair Sitting balance - Comments: Supervision for safety for dynamic sitting tasks.                                    ADL either performed or assessed with clinical judgement   ADL Overall ADL's : Needs assistance/impaired     Grooming: Set up;Applying deodorant;Wash/dry face;Wash/dry hands;Sitting Grooming Details (indicate cue type and reason): Sitting at EOB with no back support.  Upper Body Bathing: Set up;Sitting Upper Body Bathing Details (indicate cue type and reason): Sitting at EOB with no back support.          Lower Body Dressing: Maximal  assistance;Sitting/lateral leans Lower Body Dressing Details (indicate cue type and reason): Able to initiate LB dressing tasks but requiring assistance to adjust sock.                General ADL Comments: Pt able to complete LB dressing tasks, UB bathing tasks, and grooming tasks this session.      Vision       Perception     Praxis      Cognition Arousal/Alertness: Awake/alert Behavior During Therapy: WFL for tasks assessed/performed Overall Cognitive Status: Within Functional Limits for tasks assessed                                 General Comments: Slow to respond at times.         Exercises Exercises: Other exercises Other Exercises Other Exercises: Facilitated B UE increased strength with AROM for shoulder press and cross body "punches"    Shoulder Instructions       General Comments      Pertinent Vitals/ Pain       Pain Assessment: No/denies pain  Home Living                                          Prior Functioning/Environment  Frequency  Min 2X/week        Progress Toward Goals  OT Goals(current goals can now be found in the care plan section)  Progress towards OT goals: Progressing toward goals  Acute Rehab OT Goals Patient Stated Goal: go to rehab OT Goal Formulation: With patient Time For Goal Achievement: 01/04/17 Potential to Achieve Goals: Good  Plan Discharge plan remains appropriate    Co-evaluation                 AM-PAC PT "6 Clicks" Daily Activity     Outcome Measure   Help from another person eating meals?: None Help from another person taking care of personal grooming?: None Help from another person toileting, which includes using toliet, bedpan, or urinal?: Total Help from another person bathing (including washing, rinsing, drying)?: A Lot Help from another person to put on and taking off regular upper body clothing?: A Little Help from another person to put  on and taking off regular lower body clothing?: A Lot 6 Click Score: 16    End of Session Equipment Utilized During Treatment: Gait belt  OT Visit Diagnosis: Unsteadiness on feet (R26.81);Muscle weakness (generalized) (M62.81);Other abnormalities of gait and mobility (R26.89)   Activity Tolerance Patient tolerated treatment well   Patient Left in bed;with call bell/phone within reach;with bed alarm set   Nurse Communication          Time: 9924-2683 OT Time Calculation (min): 15 min  Charges: OT General Charges $OT Visit: 1 Visit OT Treatments $Self Care/Home Management : 8-22 mins  Norman Herrlich, MS OTR/L  Pager: [phone] 12/25/2016, 4:47 PM

## 2016-12-25 NOTE — Progress Notes (Signed)
Pt. With 597 mL noted in bladder upon bladder scan. MD in room at pts. Bedside.

## 2016-12-26 LAB — GLUCOSE, CAPILLARY
Glucose-Capillary: 93 mg/dL (ref 65–99)
Glucose-Capillary: 110 mg/dL — ABNORMAL HIGH (ref 65–99)
Glucose-Capillary: 161 mg/dL — ABNORMAL HIGH (ref 65–99)
Glucose-Capillary: 193 mg/dL — ABNORMAL HIGH (ref 65–99)

## 2016-12-26 LAB — BASIC METABOLIC PANEL
Anion gap: 6 (ref 5–15)
BUN: 23 mg/dL — ABNORMAL HIGH (ref 6–20)
CO2: 33 mmol/L — ABNORMAL HIGH (ref 22–32)
Calcium: 8.3 mg/dL — ABNORMAL LOW (ref 8.9–10.3)
Chloride: 100 mmol/L — ABNORMAL LOW (ref 101–111)
Creatinine, Ser: 0.99 mg/dL (ref 0.61–1.24)
GFR calc Af Amer: >60 mL/min (ref >60)
GFR calc non Af Amer: >60 mL/min (ref >60)
Glucose, Bld: 98 mg/dL (ref 65–99)
Potassium: 3.5 mmol/L (ref 3.5–5.1)
Sodium: 139 mmol/L (ref 135–145)

## 2016-12-26 MED ORDER — ASPIRIN 325 MG PO TABS
325.0000 mg | ORAL_TABLET | Freq: Every day | ORAL | Status: DC
  Administered 2016-12-26 – 2016-12-31 (×6): 325 mg via ORAL
  Filled 2016-12-26 (×6): qty 1

## 2016-12-26 NOTE — Clinical Social Work Note (Addendum)
Patient has two more bed offers from SNF's: [facility] and [facility]. CSW left patient's sister a Advertising account executive. Will provide these when she calls back.  Dayton Scrape, CSW [phone]  3:42 pm CSW spoke with patient's sister and provided updated list of bed offers. Patient's other sister is planning on [place] today.  Dayton Scrape, [place]

## 2016-12-26 NOTE — Progress Notes (Signed)
Family Medicine Teaching Service Daily Progress Note Intern Pager: [phone]  Patient name: [name] Medical record number: [id number] Date of birth: [birth year] Age: 61 y.o. Gender: male  Primary Care Provider: Steve Rattler, DO Consultants: Advanced heart failure  Code Status: Full   Pt Overview and Major Events to Date:  Admitted to [facility] on 12/20/2016  Assessment and Plan: Jake Mwangi [name] a 61 y.o. male presenting with bilateral lower extremity edema/scrotal edema. 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  CHF exacerbation BLE and scrotal edema consistent with CHF exacerbation likely 2/2 med non-compliance. Per chart review his dry weight appears to be ~207lbs. Receiving lasix 160mg  IV bid for diuresis. Weight at admission 225lbs, 204lbs on 10/25. Patient is only net -8.5L since admission per charting. Will ask advanced heart failure team for any additional recommendations. Unable to assess edema due to leg wrappings.  -Vital signs per floor routine -will ask advanced heart failure team for recs -cardiac monitoring  -strict I/Os -daily weights -continue IV Lasix to 160mg  bid -continue to hold home metolazone -tylenol prn for pain -aspirin 325  Inability to void Patient with PVR>589mL in pm of 10/23. Had foley catheter placed at that time. This will assist with diuresis. Can pull foley and undergo voiding trial when he is 1 day away from discharge. Can consider oxybutynin if he continues to have pain. Will discontinue foley and plan for voiding trial.  -discontinue foley -consider oxybutynin for pain, will hold off for now -can pull and undergo voiding trial when close to discharge  Elevated troponin-stable Troponins trended at 0.05>0.05>0.06. Likely secondary to demand ischemia from decompensated  CHF. Per chart review patient has history of elevated troponin, with recent admission in August with similar readings. Mild and flat trend. Patient denies chest pain.  -continue to monitor on telemetry -monitor EKGs  Chronic non-healing wounds on BLE Patient with large non-healing chronic venous stasiswoundson RLE managed as outpatient by Dr. Sharol Given and by home health wound care. Wound care seeing and doing daily dressing changes.  -Daily dressing changes, appreciate wound care recommendations  Limited ambulation Patient was found down by home health nurse. At that time he was crawling around on the floor. Most likely etiology is due to decompensation from chf exacerbation. PT/OT seeing while inpatient. Recommending snf for placement.  -monitor on telemetry  -PT/OT recommending SNF placement   Hypertension Blood pressure 135/105-152/110 while in ed. Current BP 136/92. Outpatient regimen consists of lasix, entresto, metolazone. Metolazone being held since admission. Will ask advanced heart failure team to comment on this medication. -Continue home entresto -continue bisoprolol -IV lasix 160mg  bid -hold metolazone  Type II DM Last A1c 7.2 in august 2018. AM CBG of 80. Takes lantus 10U andmetformin at home. -hold metformin -discontinue lantus given lower CBG readings -Sensitive SSI  Urinary retention 2/2 BPH Bladder scan on 10/23 revealing 552mL post voiding. Foley placed pm of 10/24. Voiding trial after pulling when appropriate. -continue flomax dose of 0.8mg  daily   Seizuredisorder Patient has had seizure disorder for around 1 month per history. Has not received daily keppra for at least two weeksbut has had no seizures since being off his medications.Will restart home keppra. Unclear which type of seizure patient has had in past. -keppra 750mg  bid  History of CVA -restarting home asa 325 -continue atorvastatin  Protein Calorie Malnutrition Albumin 2.4 on  admission.Has past diagnosis  in chart. Supplement meals with ensure shakes. 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 Has not been on eye drops at home for a long period of time.  -F/U with optho as outpatient.  FEN/GI: heart healthy, carb modified.  Prophylaxis: lovenox  Disposition: SNF  Subjective:  Patient today with no complaints. Denies SOB or increased pain. States no pain around catheter site. States he thinks edema is improved   Objective: Temp:  [97.8 F (36.6 C)-98.5 F (36.9 C)] 97.8 F (36.6 C) (10/25 1151) Pulse Rate:  [61-72] 65 (10/25 1151) Resp:  [18] 18 (10/25 1151) BP: (127-136)/(89-95) 135/95 (10/25 1151) SpO2:  [94 %-100 %] 94 % (10/25 1151) Weight:  [204 lb 4.8 oz (92.7 kg)] 204 lb 4.8 oz (92.7 kg) (10/25 0536) Physical Exam: General: awake and alert, sitting at edge of bed eating breakfast, NAD Cardiovascular: RRR, no MRG Respiratory: CTAB, no wheezes, rales, or rhonchi  Abdomen: soft, non tender, distended, bowel sounds normal  Extremities: legs wrapped bilaterally slightly above knee, right BKA, non tender   Laboratory:  Recent Labs Lab 12/20/16 1550 12/24/16 0629  WBC 6.1 6.2  HGB 14.6 13.2  HCT 43.2 42.1  PLT 231 306    Recent Labs Lab 12/20/16 1550  12/25/16 0518 12/26/16 0529 12/27/16 0231  NA 137  < > 138 139 139  K 4.5  < > 4.9 3.5 3.3*  CL 105  < > 102 100* 98*  CO2 22  < > 29 33* 33*  BUN 24*  < > 25* 23* 22*  CREATININE 1.14  < > 1.09 0.99 0.93  CALCIUM 8.7*  < > 7.9* 8.3* 8.2*  PROT 6.0*  --   --   --   --   BILITOT 1.9*  --   --   --   --   ALKPHOS 189*  --   --   --   --   ALT 26  --   --   --   --   AST 39  --   --   --   --   GLUCOSE 86  < > 74 98 105*  < > = values in this interval not displayed.   Imaging/Diagnostic  Tests: Dg Chest 2 View  Result Date: 12/20/2016 CLINICAL DATA:  CHF, fluid retention EXAM: CHEST  2 VIEW COMPARISON:  10/15/2016 FINDINGS: Cardiomegaly with minimal central congestion. Small pleural effusions. No focal consolidation. No pneumothorax. IMPRESSION: Cardiomegaly with minimal central congestion. Small bilateral effusions. Electronically Signed   By: Donavan Foil M.D.   On: 12/20/2016 17:38     Caroline More, DO 12/27/2016, 2:56 PM PGY-1, [place] Intern pager: [phone], text pages welcome

## 2016-12-26 NOTE — Progress Notes (Signed)
Physical Therapy Treatment Patient Details Name: [name] MRN: [id number] DOB: 1955-05-21 Today's Date: 12/26/2016    History of Present Illness 61 y.o. male with PMH HFrEF, pulm HTN, DM, PVD, R BKA, L eye blindness, macular hole of R eye, embolic stroke, and seizure disorder who presented with bilateral lower extremity edema/scrotal edema.    PT Comments    Patient is very pleasant and willing to participate in therapy. Patient required mod A for squat pivot and mod A +2 for sit to stand transfers using RW for balance upon standing. Pt able to tolerate standing for brief periods of time before fatigued. Continue to progress as tolerated with anticipated d/c to SNF for further skilled PT services.       Follow Up Recommendations  SNF     Equipment Recommendations   (to be determined at next venue)    Recommendations for Other Services       Precautions / Restrictions Precautions Precautions: Fall Restrictions Weight Bearing Restrictions: No    Mobility  Bed Mobility               General bed mobility comments: pt sitting EOB upon arrival  Transfers Overall transfer level: Needs assistance Equipment used: 1 person hand held assist;Rolling walker (2 wheeled) Transfers: Sit to/from [employer] Sit to Stand: Mod assist;+2 physical assistance   Squat pivot transfers: Mod assist     General transfer comment: cues for safety with squat pivot transfer with guidance of hips to chair and pt reaching for farthest armrest for guidance due to limited vision; mod A +2 for sit to stands X3 from recliner; pt able to maintain upright posture for 30-40 second bouts before fatigued  Ambulation/Gait                 Stairs            Wheelchair Mobility    Modified Rankin (Stroke Patients Only)       Balance Overall balance assessment: Needs assistance Sitting-balance support: Feet supported;No upper extremity supported (L foot  supported) Sitting balance-Leahy Scale: Good     Standing balance support: Bilateral upper extremity supported Standing balance-Leahy Scale: Poor                              Cognition Arousal/Alertness: Awake/alert Behavior During Therapy: WFL for tasks assessed/performed Overall Cognitive Status: Within Functional Limits for tasks assessed                                 General Comments: Slow to respond at times.       Exercises Amputee Exercises Knee Extension: AROM;Right;10 reps;Seated Chair Push Up: AROM;5 reps;Seated    General Comments        Pertinent Vitals/Pain Pain Assessment: No/denies pain    Home Living                      Prior Function            PT Goals (current goals can now be found in the care plan section) Acute Rehab PT Goals Patient Stated Goal: go to rehab PT Goal Formulation: With patient Time For Goal Achievement: 01/04/17 Potential to Achieve Goals: Fair Progress towards PT goals: Progressing toward goals    Frequency    Min 2X/week      PT Plan Current plan  remains appropriate    Co-evaluation              AM-PAC PT "6 Clicks" Daily Activity  Outcome Measure  Difficulty turning over in bed (including adjusting bedclothes, sheets and blankets)?: Unable Difficulty moving from lying on back to sitting on the side of the bed? : Unable Difficulty sitting down on and standing up from a chair with arms (e.g., wheelchair, bedside commode, etc,.)?: Unable Help needed moving to and from a bed to chair (including a wheelchair)?: A Lot Help needed walking in hospital room?: Total Help needed climbing 3-5 steps with a railing? : Total 6 Click Score: 7    End of Session Equipment Utilized During Treatment: Gait belt Activity Tolerance: Patient tolerated treatment well Patient left: in chair;with chair alarm set;with call bell/phone within reach Nurse Communication: Mobility status PT Visit  Diagnosis: Unsteadiness on feet (R26.81);Other abnormalities of gait and mobility (R26.89);Repeated falls (R29.6);Muscle weakness (generalized) (M62.81);History of falling (Z91.81);Difficulty in walking, not elsewhere classified (R26.2);Pain Pain - Right/Left: Left Pain - part of body: Leg     Time: 2595-6387 PT Time Calculation (min) (ACUTE ONLY): 19 min  Charges:  $Therapeutic Activity: 8-22 mins                    G Codes:       Earney Navy, PTA Pager: [phone]     Darliss Cheney 12/26/2016, 1:22 PM

## 2016-12-26 NOTE — Progress Notes (Signed)
Family Medicine Teaching Service Daily Progress Note Intern Pager: [phone]  Patient name: [name] Medical record number: [id number] Date of birth: Mar 20, 1955 Age: 61 y.o. Gender: male  Primary Care Provider: Steve Rattler, DO Consultants: Advanced heart failure  Code Status: Full   Pt Overview and Major Events to Date:  Admitted to [facility] on 12/20/2016  Assessment and Plan: [name] a 61 y.o. male presenting with bilateral lower extremity edema/scrotal edema. 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  CHF exacerbation BLE and scrotal edema consistent with CHF exacerbation likely 2/2 med non-compliance. Per chart review his dry weight appears to be 185lbs from note on 07/15/2015. Receiving lasix 160mg  IV bid for diuresis. Weight at admission 225, 207 on 10/24. Patient is only net -4.1L since admission per charting. Will ask advanced heart failure team for any additional recommendations. Patient continues to have 2+ edema in BLE. 2+ pitting edema BLE, no SOB. Has improved symptomatically but appears to still have some work to do. Unclear why aspirin was being held since admission, will restart 10/24. - Vital signs per floor routine - will ask advanced heart failure team for recs - cardiac monitoring  - strict I/Os - daily weights - continue IV Lasix to 160mg  bid - continue to hold home metolazone - tylenol prn for pain - restart aspirin 325  Inability to void Patient with PVR>530mL in pm of 10/23. Had foley catheter placed at that time. This will assist with diuresis. Can pull foley and undergo voiding trial when he is 1 day away from discharge. Patient having some catheter associated pain. Can consider oxybutynin if he continues to have pain. - continue foley - consider oxybutynin for pain, will  hold off for now - can pull and undergo voiding trial when close to discharge  Elevated troponin-stable Troponins trended at 0.05>0.05>0.06. Likely secondary to demand ischemia from decompensated CHF. Per chart review patient has history of elevated troponin, with recent admission in August with similar readings. Mild and flat trend. Patient denies chest pain.  -continue to monitor on telemetry -monitor EKGs  Chronic non-healing wounds on BLE Patient with large non-healing chronic venous stasiswoundson RLE managed as outpatient by Dr. Sharol Given and by home health wound care. Wound care seeing and doing daily dressing changes. Wound looks to be healing well. More importantly no s/s of developing cellulitis. - Daily dressing changes, appreciate wound care recommendations  Limited ambulation Patient was found down by home health nurse. At that time he was crawling around on the floor. Most likely etiology is due to decompensation from chf exacerbation. PT/OT seeing while inpatient. Recommending snf for placement.  -monitor on telemetry  -PT/OT recommending SNF placement   Hypertension Blood pressure 135/105-152/110 while in ed. Current BP 137/97. Outpatient regimen consists of lasix, entresto, metolazone.  Metolazone being held since admission. Will ask advanced heart failure team to comment on this medication. - Continue home entresto - continue bisoprolol - IV lasix 160mg  bid - hold metolazone  Type II DM Last A1c 7.2 in august 2018. AM CBG of 68. Takes lantus 10U andmetformin at home. - hold metformin - discontinue lantus given lower CBG readings - Sensitive SSI  Urinary retention 2/2 BPH Bladder scan on 10/23 revealing 534mL post voiding. Foley placed pm of 10/24. Voiding trial after pulling when appropriate. - continue flomax dose of 0.8mg  daily   Seizuredisorder Patient has had  seizure disorder for around 1 month per history. Has not received daily keppra for at least two  weeksbut has had no seizures since being off his medications.Will restart home keppra. Unclear which type of seizure patient has had in past. - keppra 750mg  bid  History of CVA - restarting home asa 325 - continue atorvastatin  Protein Calorie Malnutrition Albumin 2.4 on admission.Has past diagnosis in chart. Supplement meals with ensure shakes. 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 Has not been on eye drops at home for a long period of time.  -F/U with optho as outpatient.  FEN/GI: heart healthy, carb modified.  Prophylaxis: lovenox  Disposition: SNF  Subjective:  No complaints this am aside from some pain from his foley catheter. Tolerating PO. No n/v.  Objective: Temp:  [97.8 F (36.6 C)-98.2 F (36.8 C)] 98.2 F (36.8 C) (10/24 0610) Pulse Rate:  [66-68] 68 (10/24 0610) Resp:  [16-20] 18 (10/24 0610) BP: (120-139)/(79-102) 124/89 (10/24 0610) SpO2:  [94 %-95 %] 94 % (10/24 0610) Weight:  [207 lb 12.8 oz (94.3 kg)] 207 lb 12.8 oz (94.3 kg) (10/24 0610) Physical Exam: General: awake and alert, NAD, sitting up at edge of bed Cardiovascular: RRR, no MRG Respiratory: CTAB, no wheezes, rales or rhonchi  Abdomen: soft, non tender, distended, bowel sounds normal  Extremities: Patient with 2+ pitting edema BLE up to knee. Dressing in place over right bka stump. Healing, large ulcer in anterior surface of bka. Additional dressing covering several small ulcers on left leg.  Laboratory:  Recent Labs Lab 12/20/16 1550 12/24/16 0629  WBC 6.1 6.2  HGB 14.6 13.2  HCT 43.2 42.1  PLT 231 306    Recent Labs Lab 12/20/16 1550  12/24/16 0629 12/25/16 0518 12/26/16 0529  NA 137  < > 139 138 139  K 4.5  < > 3.7 4.9 3.5  CL 105  < > 100* 102 100*  CO2 22  < > 31 29 33*   BUN 24*  < > 26* 25* 23*  CREATININE 1.14  < > 1.17 1.09 0.99  CALCIUM 8.7*  < > 8.1* 7.9* 8.3*  PROT 6.0*  --   --   --   --   BILITOT 1.9*  --   --   --   --   ALKPHOS 189*  --   --   --   --   ALT 26  --   --   --   --   AST 39  --   --   --   --   GLUCOSE 86  < > 105* 74 98  < > = values in this interval not displayed.   Imaging/Diagnostic Tests: Dg Chest 2 View  Result Date: 12/20/2016 CLINICAL DATA:  CHF, fluid retention EXAM: CHEST  2 VIEW COMPARISON:  10/15/2016 FINDINGS: Cardiomegaly with minimal central congestion. Small pleural effusions. No focal consolidation. No pneumothorax. IMPRESSION: Cardiomegaly with minimal central congestion. Small bilateral effusions. Electronically Signed   By: Donavan Foil M.D.   On: 12/20/2016 17:38     Guadalupe Dawn, MD 12/26/2016, 9:42 AM PGY-1, [place] Intern pager: [phone], text pages welcome

## 2016-12-27 ENCOUNTER — Encounter: Payer: Self-pay | Admitting: *Deleted

## 2016-12-27 LAB — GLUCOSE, CAPILLARY
Glucose-Capillary: 80 mg/dL (ref 65–99)
Glucose-Capillary: 128 mg/dL — ABNORMAL HIGH (ref 65–99)
Glucose-Capillary: 159 mg/dL — ABNORMAL HIGH (ref 65–99)
Glucose-Capillary: 105 mg/dL — ABNORMAL HIGH (ref 65–99)

## 2016-12-27 LAB — BASIC METABOLIC PANEL
Anion gap: 8 (ref 5–15)
BUN: 22 mg/dL — ABNORMAL HIGH (ref 6–20)
CO2: 33 mmol/L — ABNORMAL HIGH (ref 22–32)
Calcium: 8.2 mg/dL — ABNORMAL LOW (ref 8.9–10.3)
Chloride: 98 mmol/L — ABNORMAL LOW (ref 101–111)
Creatinine, Ser: 0.93 mg/dL (ref 0.61–1.24)
GFR calc Af Amer: >60 mL/min (ref >60)
GFR calc non Af Amer: >60 mL/min (ref >60)
Glucose, Bld: 105 mg/dL — ABNORMAL HIGH (ref 65–99)
Potassium: 3.3 mmol/L — ABNORMAL LOW (ref 3.5–5.1)
Sodium: 139 mmol/L (ref 135–145)

## 2016-12-27 MED ORDER — TORSEMIDE 20 MG PO TABS
80.0000 mg | ORAL_TABLET | Freq: Two times a day (BID) | ORAL | Status: DC
  Administered 2016-12-27 – 2016-12-28 (×2): 80 mg via ORAL
  Filled 2016-12-27 (×2): qty 4

## 2016-12-27 NOTE — Progress Notes (Signed)
Family Medicine Teaching Service Daily Progress Note Intern Pager: [phone]  Patient name: [name] Medical record number: [id number] Date of birth: Oct 04, 1955 Age: 61 y.o. Gender: male  Primary Care Provider: Steve Rattler, DO Consultants: Advanced heart failure  Code Status: Full   Pt Overview and Major Events to Date:  Admitted to [facility] on 12/20/2016  Assessment and Plan: [name] a 61 y.o. male presenting with bilateral lower extremity edema/scrotal edema. 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  CHF exacerbation BLE and scrotal edema consistent with CHF exacerbation likely 2/2 med non-compliance. Per chart review his dry weight appears to be ~207lbs. Transitioned to oral torsemide bid for diuresis on 10/25. Weight at admission 225lbs, 204lbs on 10/26. Patient net negative 13.4L since admission per charting. Difficult to assess edema due to leg wrappings. Advance heart failure consulted, recommend outpatient follow up. Patient is medically stable for discharge.  -Vital signs per floor routine -advance heart failure consulted, appreciate recommendations  -cardiac monitoring  -strict I/Os -daily weights -discontinue torsemide 80mg  bid -will begin home dose lasix  -continue to holdhome metolazone -tylenol prn for pain -aspirin 325  Inability to void-resolved Patient with PVR>549mL in pm of 10/23. Had foley catheter placed at that time. This will assist with diuresis. D/ced foley on 10/25. PVR after d/c of foley showed 35mL.   Hypokalemia K of 3.4 today. K of 4.5 on admission -will replete with KDur -continue to monitor   Elevated troponin-stable Troponins trended at 0.05>0.05>0.06. Likely secondary to demand ischemia from decompensated CHF. Per chart review patient has history of  elevated troponin, with recent admission in August with similar readings. Mild and flat trend. Patient denies chest pain.  -continue to monitor on telemetry -monitor EKGs  Chronic non-healing wounds on BLE Patient with large non-healing chronic venous stasiswoundson RLE managed as outpatient by Dr. Sharol Given and by home health wound care. Wound care seeing and doing daily dressing changes.  -Daily dressing changes, appreciate wound care recommendations  Limited ambulation Patient was found down by home health nurse. At that time he was crawling around on the floor. Most likely etiology is due to decompensation from chf exacerbation. PT/OT seeing while inpatient. Recommending snf for placement. -monitor on telemetry  -PT/OT recommending SNF placement   Hypertension Blood pressure 135/105-152/110 while in ed. Current BP 148/88. Outpatient regimen consists of lasix, entresto, metolazone. Metolazone being held since admission.  -Continue home entresto -continue bisoprolol -torsemide 80mg  bid -hold metolazone  Type II DM Last A1c 7.2 in august 2018. AM CBG of 71. Takes lantus 10U andmetformin at home. -hold metformin -discontinue lantus given lower CBG readings -Sensitive SSI  Urinary retention 2/2 BPH Bladder scan on 10/23 revealing 549mL post voiding. Foley discontinued. PVR showed 10mL on 10/26.  -continue flomax dose of 0.8mg  daily   Seizuredisorder Patient has had seizure disorder for around 1 month per history. Has not received daily keppra for at least two weeksbut has had no seizures since being off his medications.Will restart home keppra. Unclear which type of seizure patient has had in past. -keppra 750mg  bid  History of CVA -restarting home asa 325 -continue atorvastatin  Protein Calorie Malnutrition Albumin 2.4 on admission.Has past diagnosis in chart. Supplement meals with ensure shakes. 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 Has not been on eye drops at home for a long period of time.  -F/U with optho as outpatient.  FEN/GI: heart healthy, carb modified.  Prophylaxis: lovenox  Disposition: SNF  Subjective:  Patient today with no complaints. Denies SOB or CP. Denies pain in extremities. States that he is urinating well and does not feel like he is retaining urine. States he think States he is still very much interested in SNF placement.   Objective: Temp:  [97.9 F (36.6 C)-98.6 F (37 C)] 98.6 F (37 C) (10/26 1108) Pulse Rate:  [60-98] 69 (10/26 1108) Resp:  [16-18] 16 (10/26 1108) BP: (122-148)/(86-97) 138/86 (10/26 1108) SpO2:  [97 %-99 %] 98 % (10/26 1108) Weight:  [200 lb 4.8 oz (90.9 kg)] 200 lb 4.8 oz (90.9 kg) (10/26 6387)   Intake/Output Summary (Last 24 hours) at 12/28/16 1345 Last data filed at 12/28/16 1322  Gross per 24 hour  Intake             1280 ml  Output             5752 ml  Net            -4472 ml    Physical Exam: General: awake and alert, NAD, sitting up at edge of bed Cardiovascular: RRR, no MRG Respiratory: CTAB, no wheezes, rales, or rhonchi  Abdomen: soft, non tender, distended, bowel sounds normal Extremities: wrapped bilaterally, right BKA. 2+ pedal pitting edema in LLE  Laboratory:  Recent Labs Lab 12/24/16 0629  WBC 6.2  HGB 13.2  HCT 42.1  PLT 306    Recent Labs Lab 12/26/16 0529 12/27/16 0231 12/28/16 0457  NA 139 139 138  K 3.5 3.3* 3.4*  CL 100* 98* 94*  CO2 33* 33* 35*  BUN 23* 22* 22*  CREATININE 0.99 0.93 0.98  CALCIUM 8.3* 8.2* 8.3*  GLUCOSE 98 105* 87    Imaging/Diagnostic Tests: Dg Chest 2 View  Result Date: 12/20/2016 CLINICAL DATA:  CHF, fluid retention EXAM: CHEST  2 VIEW COMPARISON:  10/15/2016 FINDINGS: Cardiomegaly with minimal central congestion.  Small pleural effusions. No focal consolidation. No pneumothorax. IMPRESSION: Cardiomegaly with minimal central congestion. Small bilateral effusions. Electronically Signed   By: Donavan Foil M.D.   On: 12/20/2016 17:38    Caroline More, DO 12/28/2016, 1:45 PM PGY-1, [place] Intern pager: [phone], text pages welcome

## 2016-12-27 NOTE — Clinical Social Work Note (Signed)
Patient's Level II PASARR evaluation will be completed this afternoon.  Dayton Scrape, [place]

## 2016-12-28 LAB — GLUCOSE, CAPILLARY
Glucose-Capillary: 71 mg/dL (ref 65–99)
Glucose-Capillary: 111 mg/dL — ABNORMAL HIGH (ref 65–99)
Glucose-Capillary: 136 mg/dL — ABNORMAL HIGH (ref 65–99)
Glucose-Capillary: 143 mg/dL — ABNORMAL HIGH (ref 65–99)

## 2016-12-28 LAB — BASIC METABOLIC PANEL
Anion gap: 9 (ref 5–15)
BUN: 22 mg/dL — ABNORMAL HIGH (ref 6–20)
CO2: 35 mmol/L — ABNORMAL HIGH (ref 22–32)
Calcium: 8.3 mg/dL — ABNORMAL LOW (ref 8.9–10.3)
Chloride: 94 mmol/L — ABNORMAL LOW (ref 101–111)
Creatinine, Ser: 0.98 mg/dL (ref 0.61–1.24)
GFR calc Af Amer: >60 mL/min (ref >60)
GFR calc non Af Amer: >60 mL/min (ref >60)
Glucose, Bld: 87 mg/dL (ref 65–99)
Potassium: 3.4 mmol/L — ABNORMAL LOW (ref 3.5–5.1)
Sodium: 138 mmol/L (ref 135–145)

## 2016-12-28 MED ORDER — POTASSIUM CHLORIDE CRYS ER 20 MEQ PO TBCR
40.0000 meq | EXTENDED_RELEASE_TABLET | Freq: Two times a day (BID) | ORAL | Status: AC
  Administered 2016-12-28 (×2): 40 meq via ORAL
  Filled 2016-12-28 (×2): qty 2

## 2016-12-28 MED ORDER — TORSEMIDE 20 MG PO TABS: 60.0000 mg | ORAL_TABLET | Freq: Two times a day (BID) | ORAL | Status: DC

## 2016-12-28 MED ORDER — FUROSEMIDE 40 MG PO TABS
40.0000 mg | ORAL_TABLET | Freq: Two times a day (BID) | ORAL | Status: DC
  Administered 2016-12-28 – 2016-12-31 (×6): 40 mg via ORAL
  Filled 2016-12-28 (×6): qty 1

## 2016-12-28 NOTE — Progress Notes (Signed)
Physical Therapy Treatment Patient Details Name: [name] MRN: [id number] DOB: 15-Sep-1955 Today's Date: 12/28/2016    History of Present Illness 61 y.o. male with PMH HFrEF, pulm HTN, DM, PVD, R BKA, L eye blindness, macular hole of R eye, embolic stroke, and seizure disorder who presented with bilateral lower extremity edema/scrotal edema.    PT Comments    Patient continues to progress with mobility. Current plan remains appropriate.    Follow Up Recommendations  SNF     Equipment Recommendations   (to be determined at next venue)    Recommendations for Other Services       Precautions / Restrictions Precautions Precautions: Fall Restrictions Weight Bearing Restrictions: No    Mobility  Bed Mobility Overal bed mobility: Needs Assistance Bed Mobility: Supine to Sit     Supine to sit: Min guard     General bed mobility comments: min guard for safety  Transfers Overall transfer level: Needs assistance Equipment used: Rolling walker (2 wheeled) Transfers: Sit to/from [facility] Sit to Stand: Mod assist;+2 physical assistance Stand pivot transfers: Mod assist;+2 physical assistance       General transfer comment: assist to power up into standing and for balance in standing; verbal and tactile cues for sequencing and guidance toward target  Ambulation/Gait Ambulation/Gait assistance: Mod assist;+2 safety/equipment Ambulation Distance (Feet): 6 Feet Assistive device: Rolling walker (2 wheeled) Gait Pattern/deviations: Step-to pattern Gait velocity: decreased   General Gait Details: cues for posture and sequenicng; assist for balance and management of RW   Stairs            Wheelchair Mobility    Modified Rankin (Stroke Patients Only)       Balance Overall balance assessment: Needs assistance Sitting-balance support: Feet supported;No upper extremity supported (L foot supported) Sitting balance-Leahy Scale: Good      Standing balance support: Bilateral upper extremity supported Standing balance-Leahy Scale: Poor                              Cognition Arousal/Alertness: Awake/alert Behavior During Therapy: WFL for tasks assessed/performed Overall Cognitive Status: Within Functional Limits for tasks assessed                                 General Comments: Slow to respond at times.       Exercises      General Comments        Pertinent Vitals/Pain Pain Assessment: No/denies pain    Home Living                      Prior Function            PT Goals (current goals can now be found in the care plan section) Acute Rehab PT Goals Patient Stated Goal: go to rehab PT Goal Formulation: With patient Time For Goal Achievement: 01/04/17 Potential to Achieve Goals: Fair Progress towards PT goals: Progressing toward goals    Frequency    Min 2X/week      PT Plan Current plan remains appropriate    Co-evaluation              AM-PAC PT "6 Clicks" Daily Activity  Outcome Measure  Difficulty turning over in bed (including adjusting bedclothes, sheets and blankets)?: A Lot Difficulty moving from lying on back to sitting on the side of the  bed? : A Lot Difficulty sitting down on and standing up from a chair with arms (e.g., wheelchair, bedside commode, etc,.)?: Unable Help needed moving to and from a bed to chair (including a wheelchair)?: A Lot Help needed walking in hospital room?: A Lot Help needed climbing 3-5 steps with a railing? : Total 6 Click Score: 10    End of Session Equipment Utilized During Treatment: Gait belt Activity Tolerance: Patient tolerated treatment well Patient left: in chair;with chair alarm set;with call bell/phone within reach Nurse Communication: Mobility status PT Visit Diagnosis: Unsteadiness on feet (R26.81);Other abnormalities of gait and mobility (R26.89);Repeated falls (R29.6);Muscle weakness (generalized)  (M62.81);History of falling (Z91.81);Difficulty in walking, not elsewhere classified (R26.2);Pain Pain - Right/Left: Left Pain - part of body: Leg     Time: 1005-1031 PT Time Calculation (min) (ACUTE ONLY): 26 min  Charges:  $Therapeutic Activity: 23-37 mins                    G Codes:       Earney Navy, PTA Pager: [phone]     Darliss Cheney 12/28/2016, 1:43 PM

## 2016-12-28 NOTE — Clinical Social Work Note (Addendum)
Per MD, patient is stable for discharge once PASARR obtained. PASARR still under review. CSW left voicemail for patient's sister. Will discuss SNF choice when she returns call. Patient's sister works third shift and is likely sleeping right now.  Dayton Scrape, CSW [phone]  12:28 pm CSW received call back from patient's sister. They have chosen [facility] SNF. CSW confirmed with patient and notified hospital liaison for that facility. PASARR still pending.  Dayton Scrape, [place]

## 2016-12-28 NOTE — Progress Notes (Signed)
Scanned patients bladder after voiding. Had 37mL

## 2016-12-29 LAB — BASIC METABOLIC PANEL
Anion gap: 9 (ref 5–15)
BUN: 21 mg/dL — ABNORMAL HIGH (ref 6–20)
CO2: 38 mmol/L — ABNORMAL HIGH (ref 22–32)
Calcium: 8.4 mg/dL — ABNORMAL LOW (ref 8.9–10.3)
Chloride: 90 mmol/L — ABNORMAL LOW (ref 101–111)
Creatinine, Ser: 1.07 mg/dL (ref 0.61–1.24)
GFR calc Af Amer: >60 mL/min (ref >60)
GFR calc non Af Amer: >60 mL/min (ref >60)
Glucose, Bld: 116 mg/dL — ABNORMAL HIGH (ref 65–99)
Potassium: 3.9 mmol/L (ref 3.5–5.1)
Sodium: 137 mmol/L (ref 135–145)

## 2016-12-29 LAB — GLUCOSE, CAPILLARY
Glucose-Capillary: 92 mg/dL (ref 65–99)
Glucose-Capillary: 108 mg/dL — ABNORMAL HIGH (ref 65–99)
Glucose-Capillary: 103 mg/dL — ABNORMAL HIGH (ref 65–99)
Glucose-Capillary: 134 mg/dL — ABNORMAL HIGH (ref 65–99)
Glucose-Capillary: 149 mg/dL — ABNORMAL HIGH (ref 65–99)

## 2016-12-29 NOTE — Progress Notes (Signed)
Changed pt dressing to right below the knee wound due to dressing falling off. After dressing change applied compression band to hold in place, wrapped lightly.    Pt states his left leg wound was changed this morning and does not need to be completed at this time

## 2016-12-29 NOTE — Progress Notes (Signed)
Family Medicine Teaching Service Daily Progress Note Intern Pager: [phone]  Patient name: [name] Medical record number: [id number] Date of birth: [birth year] Age: 61 y.o. Gender: male  Primary Care Provider: Steve Rattler, DO Consultants: Advanced heart failure  Code Status: Full   Pt Overview and Major Events to Date:  Admitted to [facility] on 12/20/2016  Assessment and Plan: [name] a 61 y.o. male presenting with bilateral lower extremity edema/scrotal edema. 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  CHF exacerbation Secondary to poor medication compliance. Prior dry weight appears to be ~207lbs. Up to 225lbs on admission and apparently down to 191 this AM 10/27 (however was 200 on 10/26, this is likely an error). Patient net negative 15L (3L output in last 24hrs) since admission.   -Vital signs per floor routine -advance heart failure consulted, appreciate recommendations  -cardiac monitoring  -strict I/Os -daily weights -continue home lasix  -continue to holdhome metolazone -tylenol prn for pain -aspirin 325  Hypokalemia, resolved K to 3.9 today -continue to monitor   Elevated troponin-stable Troponins trended at 0.05>0.05>0.06. Likely secondary to demand ischemia from decompensated CHF. Per chart review patient has history of elevated troponin, with recent admission in August with similar readings. Mild and flat trend. Patient denies chest pain.  -continue to monitor on telemetry -monitor EKGs  Chronic non-healing wounds on BLE Patient with large non-healing chronic venous stasiswoundson RLE managed as outpatient by Dr. Sharol Given and by home health wound care. Wound care seeing and doing daily dressing changes.  -Daily dressing changes, appreciate wound care  recommendations  Limited ambulation Patient was found down by home health nurse. At that time he was crawling around on the floor. Most likely etiology is due to decompensation from chf exacerbation. PT/OT seeing while inpatient. Recommending snf for placement. -monitor on telemetry  -PT/OT recommending SNF placement   Hypertension, stable Blood pressure 135/105-152/110 while in ed. Outpatient regimen consists of lasix, entresto, metolazone. Metolazone being held since admission.  -Continue home entresto -continue bisoprolol - lasix 40mg  BID -holding metolazone  Type II DM, stable Last A1c 7.2 in august 2018. AM CBG of 71. Takes lantus 10U andmetformin at home. -hold metformin -discontinue lantus given lower CBG readings -Sensitive SSI  Urinary retention 2/2 BPH, resolved Bladder scan on 10/23 revealing 552mL post voiding. Foley discontinued. PVR showed 44mL on 10/26.  -continue flomax dose of 0.8mg  daily   Seizuredisorder Patient has had seizure disorder for around 1 month per history. Has not received daily keppra for at least two weeksbut has had no seizures since being off his medications.Will restart home keppra. Unclear which type of seizure patient has had in past. -keppra 750mg  bid  History of CVA -restarting home asa 325 -continue atorvastatin  Protein Calorie Malnutrition Albumin 2.4 on admission.Has past diagnosis in chart. Supplement meals with ensure shakes. -ensure shakes between meals   FEN/GI: heart healthy, carb modified.  Prophylaxis: lovenox  Disposition: SNF, awaiting placement; likely [facility]. Will follow up with CSW.   Subjective:  Patient today with no complaints. Denies SOB or CP. Denies pain in extremities. States that he is urinating well and does not feel like he is retaining urine. States he think States he is still very much interested in SNF placement.   Objective: Temp:  [97.8 F (36.6 C)-98.6 F (37 C)] 97.8 F (36.6  C) (10/27 0544) Pulse Rate:  [  67-98] 67 (10/27 0544) Resp:  [16-18] 18 (10/27 0544) BP: (109-140)/(75-98) 140/98 (10/27 0544) SpO2:  [93 %-98 %] 97 % (10/27 0544) Weight:  [191 lb 12.8 oz (87 kg)-200 lb 4.8 oz (90.9 kg)] 191 lb 12.8 oz (87 kg) (10/27 0400)   Intake/Output Summary (Last 24 hours) at 12/29/16 0907 Last data filed at 12/29/16 0902  Gross per 24 hour  Intake              723 ml  Output             2200 ml  Net            -1477 ml    Physical Exam: General: awake and alert, NAD, sitting up at edge of bed with sheet over his head  Cardiovascular: RRR, no MRG Respiratory: CTAB, no wheezes, rales, or rhonchi  Abdomen: soft, non tender, distended, bowel sounds normal Extremities: open stable wound on R right BKA. LLE wrapped.   Laboratory:  Recent Labs Lab 12/24/16 0629  WBC 6.2  HGB 13.2  HCT 42.1  PLT 306    Recent Labs Lab 12/27/16 0231 12/28/16 0457 12/29/16 0610  NA 139 138 137  K 3.3* 3.4* 3.9  CL 98* 94* 90*  CO2 33* 35* 38*  BUN 22* 22* 21*  CREATININE 0.93 0.98 1.07  CALCIUM 8.2* 8.3* 8.4*  GLUCOSE 105* 87 116*    Imaging/Diagnostic Tests: Dg Chest 2 View  Result Date: 12/20/2016 CLINICAL DATA:  CHF, fluid retention EXAM: CHEST  2 VIEW COMPARISON:  10/15/2016 FINDINGS: Cardiomegaly with minimal central congestion. Small pleural effusions. No focal consolidation. No pneumothorax. IMPRESSION: Cardiomegaly with minimal central congestion. Small bilateral effusions. Electronically Signed   By: Donavan Foil M.D.   On: 12/20/2016 17:38    Eloise Levels, MD 12/29/2016, 9:07 AM PGY-2, [place] Intern pager: [phone], text pages welcome

## 2016-12-30 DIAGNOSIS — R3914 Feeling of incomplete bladder emptying: Secondary | ICD-10-CM

## 2016-12-30 LAB — BASIC METABOLIC PANEL
Anion gap: 7 (ref 5–15)
BUN: 25 mg/dL — ABNORMAL HIGH (ref 6–20)
CO2: 36 mmol/L — ABNORMAL HIGH (ref 22–32)
Calcium: 8.2 mg/dL — ABNORMAL LOW (ref 8.9–10.3)
Chloride: 92 mmol/L — ABNORMAL LOW (ref 101–111)
Creatinine, Ser: 1.12 mg/dL (ref 0.61–1.24)
GFR calc Af Amer: >60 mL/min (ref >60)
GFR calc non Af Amer: >60 mL/min (ref >60)
Glucose, Bld: 112 mg/dL — ABNORMAL HIGH (ref 65–99)
Potassium: 3.5 mmol/L (ref 3.5–5.1)
Sodium: 135 mmol/L (ref 135–145)

## 2016-12-30 LAB — GLUCOSE, CAPILLARY
Glucose-Capillary: 106 mg/dL — ABNORMAL HIGH (ref 65–99)
Glucose-Capillary: 141 mg/dL — ABNORMAL HIGH (ref 65–99)
Glucose-Capillary: 129 mg/dL — ABNORMAL HIGH (ref 65–99)
Glucose-Capillary: 104 mg/dL — ABNORMAL HIGH (ref 65–99)

## 2016-12-30 NOTE — Progress Notes (Signed)
Family Medicine Teaching Service Daily Progress Note Intern Pager: [phone]  Patient name: [name] Medical record number: [id number] Date of birth: [birth year] Age: 61 y.o. Gender: male  Primary Care Provider: Steve Rattler, DO Consultants: Advanced heart failure  Code Status: Full   Pt Overview and Major Events to Date:  Admitted to [facility] on 12/20/2016 with LE edema 10/20 - HF team aware, rec'd outpatient follow up, primarily social concerns 10/22 PT recommended SNF 10/24 foley placed 2/2 retention 10/26 passed voiding trial 10/28 awaiting PASSAR for SNF placement  Assessment and Plan: Jake Plotkin [name] a 61 y.o. male presenting with bilateral lower extremity edema/scrotal edema. 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  CHF exacerbation Secondary to poor medication compliance, longstanding issue. Prior dry weight appears to be ~207lbs. Weight now 194. Patient net negative 15L (836mL + 1x unmeasured output in last 24hrs) since admission. Some decrease in UOP 10/27, likely missed voids, clinically euvolemic.  -Vital signs per floor routine  -strict I/Os -daily weights -continue home lasix  -continue to holdhome metolazone -tylenol prn for pain -aspirin 325  Chronic non-healing wounds on BLE Patient with large non-healing chronic venous stasiswoundson RLE managed as outpatient by Dr. Sharol Given and by home health wound care. Wound care seeing and doing daily dressing changes.  -Daily dressing changes, appreciate wound care recommendations  Limited ambulation Patient was found down by home health nurse.  -PT/OT recommending SNF placement   Hypertension, stable Appropriate. Outpatient regimen consists of lasix, entresto, metolazone. Metolazone stopped this admission. -Continue home  entresto -continue bisoprolol -lasix 40mg  BID -holding metolazone  Type II DM, stable Last A1c 7.2 in august 2018. Takes lantus 10U andmetformin at home. -hold metformin -holding lantus given lower CBG readings -Sensitive SSI  Urinary retention 2/2 BPH, resolved Bladder scan on 10/23 revealing 533mL post voiding. Foley discontinued. PVR showed 86mL on 10/26.  -continue flomax dose of 0.8mg  daily   Seizuredisorder Continued home keppra. Unclear which type of seizure patient has had in past. -keppra 750mg  bid  History of CVA -home asa 325 -continue atorvastatin  Protein Calorie Malnutrition Albumin 2.4 on admission.Has past diagnosis in chart. Supplement meals with ensure shakes. -ensure shakes between meals  FEN/GI: heart healthy, carb modified.  Prophylaxis: lovenox  Disposition: SNF, awaiting placement, medically ready for d/c  Subjective:  Patient feels well this morning, no complaints. Sitting on EOB comfortably.   Objective: Temp:  [98.3 F (36.8 C)-99.3 F (37.4 C)] 98.4 F (36.9 C) (10/28 0615) Pulse Rate:  [66-71] 66 (10/28 0615) Resp:  [18-20] 18 (10/28 0615) BP: (126-129)/(89-95) 128/89 (10/28 0615) SpO2:  [95 %-97 %] 95 % (10/28 0615) Weight:  [194 lb (88 kg)] 194 lb (88 kg) (10/28 0615)   Intake/Output Summary (Last 24 hours) at 12/30/16 0834 Last data filed at 12/30/16 0700  Gross per 24 hour  Intake              820 ml  Output             1000 ml  Net             -180 ml    Physical Exam: General: awake and alert, NAD, sitting up at edge of bed with sheet over his head  Cardiovascular: RRR, no MRG Respiratory: CTAB, no wheezes, rales, or rhonchi  Abdomen: soft, non tender, distended, bowel sounds normal Extremities: R BKA  wrapped. LLE wrapped.   Laboratory:  Recent Labs Lab 12/24/16 0629  WBC 6.2  HGB 13.2  HCT 42.1  PLT 306    Recent Labs Lab 12/28/16 0457 12/29/16 0610 12/30/16 0426  NA 138 137 135  K 3.4* 3.9 3.5   CL 94* 90* 92*  CO2 35* 38* 36*  BUN 22* 21* 25*  CREATININE 0.98 1.07 1.12  CALCIUM 8.3* 8.4* 8.2*  GLUCOSE 87 116* 112*    Imaging/Diagnostic Tests: Dg Chest 2 View  Result Date: 12/20/2016 CLINICAL DATA:  CHF, fluid retention EXAM: CHEST  2 VIEW COMPARISON:  10/15/2016 FINDINGS: Cardiomegaly with minimal central congestion. Small pleural effusions. No focal consolidation. No pneumothorax. IMPRESSION: Cardiomegaly with minimal central congestion. Small bilateral effusions. Electronically Signed   By: Donavan Foil M.D.   On: 12/20/2016 17:38    Sela Hilding, MD 12/30/2016, 8:34 AM PGY-2, [place] Intern pager: [phone], text pages welcome

## 2016-12-31 LAB — BASIC METABOLIC PANEL
Anion gap: 8 (ref 5–15)
BUN: 27 mg/dL — ABNORMAL HIGH (ref 6–20)
CO2: 33 mmol/L — ABNORMAL HIGH (ref 22–32)
Calcium: 8.2 mg/dL — ABNORMAL LOW (ref 8.9–10.3)
Chloride: 95 mmol/L — ABNORMAL LOW (ref 101–111)
Creatinine, Ser: 1.12 mg/dL (ref 0.61–1.24)
GFR calc Af Amer: >60 mL/min (ref >60)
GFR calc non Af Amer: >60 mL/min (ref >60)
Glucose, Bld: 93 mg/dL (ref 65–99)
Potassium: 4.5 mmol/L (ref 3.5–5.1)
Sodium: 136 mmol/L (ref 135–145)

## 2016-12-31 LAB — GLUCOSE, CAPILLARY
Glucose-Capillary: 84 mg/dL (ref 65–99)
Glucose-Capillary: 147 mg/dL — ABNORMAL HIGH (ref 65–99)

## 2016-12-31 MED ORDER — TAMSULOSIN HCL 0.4 MG PO CAPS: 0.8000 mg | ORAL_CAPSULE | Freq: Every day | ORAL | 0 refills | Status: AC

## 2016-12-31 MED ORDER — FUROSEMIDE 40 MG PO TABS: 40.0000 mg | ORAL_TABLET | Freq: Two times a day (BID) | ORAL | 0 refills | Status: DC

## 2016-12-31 NOTE — Progress Notes (Signed)
Report called to [facility], talked to Sam  Pt aware, all belongings packed, AVS and paper work ready for transfer [place]

## 2016-12-31 NOTE — Progress Notes (Signed)
Pt IV discontinued, catheter intact and telemetry removed Awaiting social work confirmation to call report

## 2016-12-31 NOTE — Clinical Social Work Note (Signed)
PASARR obtained: [phone] [name] can take patient today. They are ordering him a specialty mattress which should arrive later this afternoon. Patient and his sister, Ms. Helene Kelp, notified.  Dayton Scrape, [place]

## 2016-12-31 NOTE — Progress Notes (Signed)
Family Medicine Teaching Service Daily Progress Note Intern Pager: [phone]  Patient name: [name] Medical record number: [id number] Date of birth: 07/23/1955 Age: 61 y.o. Gender: male  Primary Care Provider: Steve Rattler, DO Consultants: Advanced heart failure  Code Status: Full   Pt Overview and Major Events to Date:  Admitted to [facility] on 12/20/2016 with LE edema 10/20 - HF team aware, rec'd outpatient follow up, primarily social concerns 10/22 PT recommended SNF 10/24 foley placed 2/2 retention 10/26 passed voiding trial 10/28 awaiting PASSAR for SNF placement  Assessment and Plan: [name] a 61 y.o. male presenting with bilateral lower extremity edema/scrotal edema. 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  CHF exacerbation Secondary to medication non-compliance. Prior dry weight appears to be ~207lbs. Weight now 195lbs. Patient net negative 16L since admission. Clinically euvolemic.  -Vital signs per floor routine  -strict I/Os -daily weights -continue home lasix  -continue to holdhome metolazone -tylenol prn for pain -aspirin 325  Chronic non-healing wounds on BLE Patient with large non-healing chronic venous stasiswoundson RLE managed as outpatient by Dr. Sharol Given and by home health wound care. Wound care seeing and doing daily dressing changes.  -Daily dressing changes, appreciate wound care recommendations  Limited ambulation Patient was found down by home health nurse.  -PT/OT recommending SNF placement   Hypertension, stable Appropriate. BP of 133/94. Outpatient regimen consists of lasix, entresto, metolazone. Metolazone stopped this admission. -Continue home entresto -continue bisoprolol -lasix 40mg  BID -holding metolazone  Type II DM, stable Last A1c 7.2 in  august 2018. Takes lantus 10U andmetformin at home. -hold metformin -holding lantus given lower CBG readings -Sensitive SSI  Urinary retention 2/2 BPH, resolved Bladder scan on 10/23 revealing 51mL post voiding. Foley discontinued. PVR showed 88mL on 10/26.  -continue flomax dose of 0.8mg  daily   Seizuredisorder Continued home keppra. Unclear which type of seizure patient has had in past. -keppra 750mg  bid  History of CVA -home asa 325 -continue atorvastatin  Protein Calorie Malnutrition Albumin 2.4 on admission.Has past diagnosis in chart. Supplement meals with ensure shakes. -ensure shakes between meals  FEN/GI: heart healthy, carb modified.  Prophylaxis: lovenox  Disposition: SNF  Subjective:  Patient today with no complaints. States he is ready for discharge. States is is urinating well. Denies CP, SOB, or pain in extremities.   Objective: Temp:  [97.6 F (36.4 C)-98 F (36.7 C)] 97.6 F (36.4 C) (10/29 1202) Pulse Rate:  [60-69] 60 (10/29 1202) Resp:  [18-20] 18 (10/29 1202) BP: (113-133)/(80-94) 132/93 (10/29 1202) SpO2:  [96 %-97 %] 96 % (10/29 1202) Weight:  [195 lb (88.5 kg)] 195 lb (88.5 kg) (10/29 0500)   Intake/Output Summary (Last 24 hours) at 12/31/16 1336 Last data filed at 12/31/16 1333  Gross per 24 hour  Intake             1300 ml  Output             1475 ml  Net             -175 ml    Physical Exam: General: awake and alert, NAD, sitting up in bed  Cardiovascular: RRR, no MRG Respiratory: CTAB, no wheezes, rales, or rhonchi  Abdomen: soft, non tender, non distended, bowel sounds normal  Extremities: right bka, wounds dressed, no edema, non tender,   Laboratory: No results for input(s): WBC, HGB, HCT,  PLT in the last 168 hours.  Recent Labs Lab 12/29/16 0610 12/30/16 0426 12/31/16 0535  NA 137 135 136  K 3.9 3.5 4.5  CL 90* 92* 95*  CO2 38* 36* 33*  BUN 21* 25* 27*  CREATININE 1.07 1.12 1.12  CALCIUM 8.4* 8.2* 8.2*   GLUCOSE 116* 112* 93    Imaging/Diagnostic Tests: Dg Chest 2 View  Result Date: 12/20/2016 CLINICAL DATA:  CHF, fluid retention EXAM: CHEST  2 VIEW COMPARISON:  10/15/2016 FINDINGS: Cardiomegaly with minimal central congestion. Small pleural effusions. No focal consolidation. No pneumothorax. IMPRESSION: Cardiomegaly with minimal central congestion. Small bilateral effusions. Electronically Signed   By: Donavan Foil M.D.   On: 12/20/2016 17:38    Caroline More, DO 12/31/2016, 1:36 PM PGY-1, [place] Intern pager: [phone], text pages welcome

## 2016-12-31 NOTE — Progress Notes (Signed)
Physical Therapy Treatment Patient Details Name: [name] MRN: [id number] DOB: October 03, 1955 Today's Date: 12/31/2016    History of Present Illness 61 y.o. male with PMH HFrEF, pulm HTN, DM, PVD, R BKA, L eye blindness, macular hole of R eye, embolic stroke, and seizure disorder who presented with bilateral lower extremity edema/scrotal edema.    PT Comments    Patient continues to make gradual progress toward mobility goals. Pt tolerated session well with c/o pain in R LE mainly with R knee ROM. Pt required min/mod A for functional transfers. Continue to progress as tolerated with anticipated d/c to SNF for further skilled PT services.     Follow Up Recommendations  SNF     Equipment Recommendations   (to be determined at next venue)    Recommendations for Other Services       Precautions / Restrictions Precautions Precautions: Fall Restrictions Weight Bearing Restrictions: No    Mobility  Bed Mobility               General bed mobility comments: pt sitting EOB upon arrival  Transfers Overall transfer level: Needs assistance Equipment used: Rolling walker (2 wheeled) Transfers: Sit to/from Stand Sit to Stand: Mod assist;+2 physical assistance;Min assist         General transfer comment: initial stand from EOB pt required mod A +2 for power up and gaining balance upon stand; second trial min A +2 as pt had better foot/hand placement   Ambulation/Gait Ambulation/Gait assistance: Mod assist;+2 safety/equipment Ambulation Distance (Feet): 6 Feet Assistive device: Rolling walker (2 wheeled) Gait Pattern/deviations: Step-to pattern Gait velocity: decreased   General Gait Details: assist for balance and advancing RW; cues for posture and sequencing   Stairs            Wheelchair Mobility    Modified Rankin (Stroke Patients Only)       Balance Overall balance assessment: Needs assistance Sitting-balance support: Feet supported;No upper  extremity supported (L foot supported) Sitting balance-Leahy Scale: Good     Standing balance support: Bilateral upper extremity supported Standing balance-Leahy Scale: Poor                              Cognition Arousal/Alertness: Awake/alert Behavior During Therapy: WFL for tasks assessed/performed Overall Cognitive Status: Within Functional Limits for tasks assessed                                        Exercises Amputee Exercises Knee Extension: AROM;10 reps;Seated;Both Chair Push Up: AROM;Seated;10 reps    General Comments        Pertinent Vitals/Pain Pain Assessment: Faces Faces Pain Scale: Hurts little more Pain Location: R LE Pain Descriptors / Indicators: Grimacing;Sore Pain Intervention(s): Limited activity within patient's tolerance;Monitored during session;Repositioned    Home Living                      Prior Function            PT Goals (current goals can now be found in the care plan section) Acute Rehab PT Goals Patient Stated Goal: go to rehab PT Goal Formulation: With patient Time For Goal Achievement: 01/04/17 Potential to Achieve Goals: Fair Progress towards PT goals: Progressing toward goals    Frequency    Min 2X/week      PT Plan Current  plan remains appropriate    Co-evaluation              AM-PAC PT "6 Clicks" Daily Activity  Outcome Measure  Difficulty turning over in bed (including adjusting bedclothes, sheets and blankets)?: A Lot Difficulty moving from lying on back to sitting on the side of the bed? : A Lot Difficulty sitting down on and standing up from a chair with arms (e.g., wheelchair, bedside commode, etc,.)?: Unable Help needed moving to and from a bed to chair (including a wheelchair)?: A Little Help needed walking in hospital room?: A Lot Help needed climbing 3-5 steps with a railing? : Total 6 Click Score: 11    End of Session Equipment Utilized During Treatment:  Gait belt Activity Tolerance: Patient tolerated treatment well Patient left: in chair;with chair alarm set;with call bell/phone within reach Nurse Communication: Mobility status PT Visit Diagnosis: Unsteadiness on feet (R26.81);Other abnormalities of gait and mobility (R26.89);Repeated falls (R29.6);Muscle weakness (generalized) (M62.81);History of falling (Z91.81);Difficulty in walking, not elsewhere classified (R26.2);Pain Pain - Right/Left: Left Pain - part of body: Leg     Time: 3606-7703 PT Time Calculation (min) (ACUTE ONLY): 25 min  Charges:  $Gait Training: 8-22 mins $Therapeutic Activity: 8-22 mins                    G Codes:       Earney Navy, PTA Pager: [phone]     Darliss Cheney 12/31/2016, 10:43 AM

## 2016-12-31 NOTE — Clinical Social Work Placement (Signed)
   CLINICAL SOCIAL WORK PLACEMENT  NOTE  Date:  12/31/2016  Patient Details  Name: [name] MRN: [id number] Date of Birth: April 15, 1955  Clinical Social Work is seeking post-discharge placement for this patient at the [place] level of care (*CSW will initial, date and re-position this form in  chart as items are completed):  Yes   Patient/family provided with [facility] Work Department's list of facilities offering this level of care within the geographic area requested by the patient (or if unable, by the patient's family).  Yes   Patient/family informed of their freedom to choose among providers that offer the needed level of care, that participate in Medicare, Medicaid or managed care program needed by the patient, have an available bed and are willing to accept the patient.  Yes   Patient/family informed of [facility]'s ownership interest in [facility] and [facility], as well as of the fact that they are under no obligation to receive care at these facilities.  PASRR submitted to EDS on 12/21/16     PASRR number received on 12/31/16     Existing PASRR number confirmed on       FL2 transmitted to all facilities in geographic area requested by pt/family on 12/21/16     FL2 transmitted to all facilities within larger geographic area on       Patient informed that his/her managed care company has contracts with or will negotiate with certain facilities, including the following:        Yes   Patient/family informed of bed offers received.  Patient chooses bed at [facility]     Physician recommends and patient chooses bed at      Patient to be transferred to [facility] on 12/31/16.  Patient to be transferred to facility by [facility]     Patient family notified on 12/31/16 of transfer.  Name of family member notified:  Doree Fudge     PHYSICIAN Please prepare prescriptions     Additional  Comment:    _______________________________________________ Candie Chroman, LCSW 12/31/2016, 2:26 PM

## 2016-12-31 NOTE — Discharge Instructions (Signed)
You were admitted for a CHF exacerbation because you did not take your medications. It is very important to take your medications as prescribed.  You have a follow up appointment on 01/04/2017 @ 3:15pm with Dr. Tammi Klippel. Please arrive at least 13min early.   If you have symptoms of chest pain, increased swelling, difficulty urinating, or fever please call your PCP or come to the emergency room.

## 2016-12-31 NOTE — Progress Notes (Signed)
[facility] at bedside, report given  Transport has all paperwork and pt belongings  Pt discharged

## 2016-12-31 NOTE — Clinical Social Work Note (Signed)
CSW facilitated patient discharge including contacting patient family and facility to confirm patient discharge plans. Clinical information faxed to facility and family agreeable with plan. CSW arranged ambulance transport via [facility] to [facility]. RN to call report prior to discharge ([phone]).  CSW will sign off for now as social work intervention is no longer needed. Please consult us again if new needs arise.  Sarah Boswell, CSW [phone]   

## 2017-01-03 NOTE — Progress Notes (Deleted)
   Subjective:    Patient ID: [name], male    DOB: 11-19-1955, 61 y.o.   MRN: [id number]  ***repeat BMP  CC: Hospital follow up  HPI: Hospital follow up Admitted on 10/18 and discharged on 10/29 for CHF exacerbation 2/2 medication non-compliance.   1. Compliance with medications - lasix 40mg  bid  2. Monitor wounds on BLE -  Wound care?  3. Monitor BP 4. Follow up with Dr. 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 Monitor urinary retention  6. Re-check BMP in 24-48hrs  7. Monitor urine output 8. Can titrate lasix dose up as needed to maintain adequate diuresis  9. Discontinued home lantus, metolazone, and KDur  Smoking status reviewed  Review of Systems   Objective:  There were no vitals taken for this visit. Vitals and nursing note reviewed  General: well nourished, in no acute distress HEENT: normocephalic, TM's visualized bilaterally, no scleral icterus or conjunctival pallor, no nasal discharge, moist mucous membranes, good dentition without erythema or discharge noted in posterior oropharynx Neck: supple, non-tender, without lymphadenopathy Cardiac: RRR, clear S1 and S2, no murmurs, rubs, or gallops Respiratory: clear to auscultation bilaterally, no increased work of breathing Abdomen: soft, nontender, nondistended, no masses or organomegaly. Bowel sounds present Extremities: no edema or cyanosis. Warm, well perfused. 2+ radial and PT pulses bilaterally Skin: warm and dry, no rashes noted Neuro: alert and oriented, no focal deficits   Assessment & Plan:    No problem-specific Assessment & Plan notes found for this encounter.    No Follow-up on file.   Caroline More, DO, PGY-1

## 2017-01-04 ENCOUNTER — Inpatient Hospital Stay: Payer: Medicaid Other | Admitting: Family Medicine

## 2017-01-09 ENCOUNTER — Telehealth (INDEPENDENT_AMBULATORY_CARE_PROVIDER_SITE_OTHER): Payer: Self-pay | Admitting: Orthopedic Surgery

## 2017-01-09 NOTE — Telephone Encounter (Signed)
FYI - Patients sister called to let Dr Sharol Given know that he is now at [facility] and [facility].

## 2017-01-09 NOTE — Telephone Encounter (Signed)
This encounter was created in error - please disregard.

## 2017-01-09 NOTE — Telephone Encounter (Signed)
Noted will let Dr. Sharol Given know.

## 2017-01-21 ENCOUNTER — Other Ambulatory Visit (HOSPITAL_COMMUNITY): Payer: Self-pay | Admitting: Family

## 2017-01-21 DIAGNOSIS — R188 Other ascites: Secondary | ICD-10-CM

## 2017-01-22 ENCOUNTER — Other Ambulatory Visit (HOSPITAL_COMMUNITY): Payer: Self-pay | Admitting: Family

## 2017-01-22 ENCOUNTER — Ambulatory Visit (HOSPITAL_COMMUNITY)
Admission: RE | Admit: 2017-01-22 | Discharge: 2017-01-22 | Disposition: A | Discharge status: Home / Self Care | Payer: Medicaid Other | Source: Ambulatory Visit | Attending: Family | Admitting: Family

## 2017-01-22 DIAGNOSIS — R188 Other ascites: Secondary | ICD-10-CM | POA: Insufficient documentation

## 2017-01-22 MED ORDER — LIDOCAINE 2% (20 MG/ML) 5 ML SYRINGE
INTRAMUSCULAR | Status: AC
  Filled 2017-01-22: qty 10

## 2017-01-22 NOTE — Progress Notes (Signed)
Patient presented to radiology department for possible therapeutic paracentesis.  Limited US Abdomen performed; results dictated separately.  Survey for fluid shows only a small amount of perihepatic fluid.  Patient has no abdominal complaints today- no distention, abdominal pain, bloating, or shortness of breath.   No procedure performed today.   Brynda Greathouse, MS RD PA-C 11:31 AM

## 2017-01-30 ENCOUNTER — Emergency Department (HOSPITAL_COMMUNITY): Payer: Medicaid Other

## 2017-01-30 ENCOUNTER — Emergency Department (HOSPITAL_BASED_OUTPATIENT_CLINIC_OR_DEPARTMENT_OTHER): Admit: 2017-01-30 | Discharge: 2017-01-30 | Disposition: A | Discharge status: Home / Self Care | Payer: Medicaid Other

## 2017-01-30 ENCOUNTER — Other Ambulatory Visit: Payer: Self-pay

## 2017-01-30 ENCOUNTER — Encounter (HOSPITAL_COMMUNITY): Payer: Self-pay | Admitting: Emergency Medicine

## 2017-01-30 ENCOUNTER — Inpatient Hospital Stay (HOSPITAL_COMMUNITY): Payer: Medicaid Other

## 2017-01-30 ENCOUNTER — Inpatient Hospital Stay (HOSPITAL_COMMUNITY)
Admission: EM | Admit: 2017-01-30 | Discharge: 2017-02-07 | DRG: 871 | Disposition: A | Discharge status: Skilled Nursing Facility | Payer: Medicaid Other | Attending: Family Medicine | Admitting: Family Medicine

## 2017-01-30 DIAGNOSIS — R748 Abnormal levels of other serum enzymes: Secondary | ICD-10-CM | POA: Diagnosis present

## 2017-01-30 DIAGNOSIS — Z7984 Long term (current) use of oral hypoglycemic drugs: Secondary | ICD-10-CM

## 2017-01-30 DIAGNOSIS — R339 Retention of urine, unspecified: Secondary | ICD-10-CM | POA: Diagnosis present

## 2017-01-30 DIAGNOSIS — N183 Chronic kidney disease, stage 3 (moderate): Secondary | ICD-10-CM | POA: Diagnosis present

## 2017-01-30 DIAGNOSIS — I130 Hypertensive heart and chronic kidney disease with heart failure and stage 1 through stage 4 chronic kidney disease, or unspecified chronic kidney disease: Secondary | ICD-10-CM | POA: Diagnosis present

## 2017-01-30 DIAGNOSIS — G40909 Epilepsy, unspecified, not intractable, without status epilepticus: Secondary | ICD-10-CM | POA: Diagnosis present

## 2017-01-30 DIAGNOSIS — I5043 Acute on chronic combined systolic (congestive) and diastolic (congestive) heart failure: Secondary | ICD-10-CM | POA: Diagnosis present

## 2017-01-30 DIAGNOSIS — T68XXXD Hypothermia, subsequent encounter: Secondary | ICD-10-CM | POA: Diagnosis not present

## 2017-01-30 DIAGNOSIS — I499 Cardiac arrhythmia, unspecified: Secondary | ICD-10-CM

## 2017-01-30 DIAGNOSIS — E1140 Type 2 diabetes mellitus with diabetic neuropathy, unspecified: Secondary | ICD-10-CM | POA: Diagnosis present

## 2017-01-30 DIAGNOSIS — I420 Dilated cardiomyopathy: Secondary | ICD-10-CM | POA: Diagnosis present

## 2017-01-30 DIAGNOSIS — Z87891 Personal history of nicotine dependence: Secondary | ICD-10-CM

## 2017-01-30 DIAGNOSIS — R4182 Altered mental status, unspecified: Secondary | ICD-10-CM

## 2017-01-30 DIAGNOSIS — G9340 Encephalopathy, unspecified: Secondary | ICD-10-CM | POA: Diagnosis present

## 2017-01-30 DIAGNOSIS — E785 Hyperlipidemia, unspecified: Secondary | ICD-10-CM | POA: Diagnosis present

## 2017-01-30 DIAGNOSIS — J9602 Acute respiratory failure with hypercapnia: Secondary | ICD-10-CM | POA: Diagnosis present

## 2017-01-30 DIAGNOSIS — E1122 Type 2 diabetes mellitus with diabetic chronic kidney disease: Secondary | ICD-10-CM | POA: Diagnosis present

## 2017-01-30 DIAGNOSIS — R601 Generalized edema: Secondary | ICD-10-CM | POA: Diagnosis not present

## 2017-01-30 DIAGNOSIS — Z89511 Acquired absence of right leg below knee: Secondary | ICD-10-CM

## 2017-01-30 DIAGNOSIS — Z8249 Family history of ischemic heart disease and other diseases of the circulatory system: Secondary | ICD-10-CM

## 2017-01-30 DIAGNOSIS — E1151 Type 2 diabetes mellitus with diabetic peripheral angiopathy without gangrene: Secondary | ICD-10-CM | POA: Diagnosis present

## 2017-01-30 DIAGNOSIS — L03113 Cellulitis of right upper limb: Secondary | ICD-10-CM | POA: Diagnosis present

## 2017-01-30 DIAGNOSIS — L8990 Pressure ulcer of unspecified site, unspecified stage: Secondary | ICD-10-CM | POA: Diagnosis present

## 2017-01-30 DIAGNOSIS — T68XXXA Hypothermia, initial encounter: Secondary | ICD-10-CM

## 2017-01-30 DIAGNOSIS — E669 Obesity, unspecified: Secondary | ICD-10-CM | POA: Diagnosis present

## 2017-01-30 DIAGNOSIS — E46 Unspecified protein-calorie malnutrition: Secondary | ICD-10-CM | POA: Diagnosis present

## 2017-01-30 DIAGNOSIS — G4730 Sleep apnea, unspecified: Secondary | ICD-10-CM | POA: Diagnosis present

## 2017-01-30 DIAGNOSIS — I272 Pulmonary hypertension, unspecified: Secondary | ICD-10-CM | POA: Diagnosis present

## 2017-01-30 DIAGNOSIS — Z8673 Personal history of transient ischemic attack (TIA), and cerebral infarction without residual deficits: Secondary | ICD-10-CM

## 2017-01-30 DIAGNOSIS — Z7982 Long term (current) use of aspirin: Secondary | ICD-10-CM

## 2017-01-30 DIAGNOSIS — E11649 Type 2 diabetes mellitus with hypoglycemia without coma: Secondary | ICD-10-CM | POA: Diagnosis present

## 2017-01-30 DIAGNOSIS — Z794 Long term (current) use of insulin: Secondary | ICD-10-CM

## 2017-01-30 DIAGNOSIS — E2740 Unspecified adrenocortical insufficiency: Secondary | ICD-10-CM | POA: Diagnosis present

## 2017-01-30 DIAGNOSIS — I509 Heart failure, unspecified: Secondary | ICD-10-CM | POA: Diagnosis not present

## 2017-01-30 DIAGNOSIS — Z6833 Body mass index (BMI) 33.0-33.9, adult: Secondary | ICD-10-CM

## 2017-01-30 DIAGNOSIS — R609 Edema, unspecified: Secondary | ICD-10-CM | POA: Diagnosis not present

## 2017-01-30 DIAGNOSIS — R4781 Slurred speech: Secondary | ICD-10-CM

## 2017-01-30 DIAGNOSIS — I5023 Acute on chronic systolic (congestive) heart failure: Secondary | ICD-10-CM | POA: Diagnosis not present

## 2017-01-30 DIAGNOSIS — J9692 Respiratory failure, unspecified with hypercapnia: Secondary | ICD-10-CM | POA: Diagnosis not present

## 2017-01-30 DIAGNOSIS — A419 Sepsis, unspecified organism: Principal | ICD-10-CM | POA: Diagnosis present

## 2017-01-30 DIAGNOSIS — K5900 Constipation, unspecified: Secondary | ICD-10-CM | POA: Diagnosis not present

## 2017-01-30 DIAGNOSIS — Z79899 Other long term (current) drug therapy: Secondary | ICD-10-CM

## 2017-01-30 DIAGNOSIS — L89892 Pressure ulcer of other site, stage 2: Secondary | ICD-10-CM | POA: Diagnosis not present

## 2017-01-30 DIAGNOSIS — I5084 End stage heart failure: Secondary | ICD-10-CM | POA: Diagnosis present

## 2017-01-30 DIAGNOSIS — F419 Anxiety disorder, unspecified: Secondary | ICD-10-CM | POA: Diagnosis present

## 2017-01-30 DIAGNOSIS — N4889 Other specified disorders of penis: Secondary | ICD-10-CM | POA: Diagnosis present

## 2017-01-30 LAB — COMPREHENSIVE METABOLIC PANEL
ALT: 21 U/L (ref 17–63)
AST: 27 U/L (ref 15–41)
Albumin: 2.3 g/dL — ABNORMAL LOW (ref 3.5–5.0)
Alkaline Phosphatase: 233 U/L — ABNORMAL HIGH (ref 38–126)
Anion gap: 5 (ref 5–15)
BUN: 24 mg/dL — ABNORMAL HIGH (ref 6–20)
CO2: 27 mmol/L (ref 22–32)
Calcium: 8.4 mg/dL — ABNORMAL LOW (ref 8.9–10.3)
Chloride: 108 mmol/L (ref 101–111)
Creatinine, Ser: 0.93 mg/dL (ref 0.61–1.24)
GFR calc Af Amer: >60 mL/min (ref >60)
GFR calc non Af Amer: >60 mL/min (ref >60)
Glucose, Bld: 89 mg/dL (ref 65–99)
Potassium: 3.7 mmol/L (ref 3.5–5.1)
Sodium: 140 mmol/L (ref 135–145)
Total Bilirubin: 1.1 mg/dL (ref 0.3–1.2)
Total Protein: 5.9 g/dL — ABNORMAL LOW (ref 6.5–8.1)

## 2017-01-30 LAB — CREATININE, SERUM
Creatinine, Ser: 0.89 mg/dL (ref 0.61–1.24)
GFR calc Af Amer: >60 mL/min (ref >60)
GFR calc non Af Amer: >60 mL/min (ref >60)

## 2017-01-30 LAB — CBC
HCT: 41.5 % (ref 39.0–52.0)
HCT: 42.2 % (ref 39.0–52.0)
Hemoglobin: 13.4 g/dL (ref 13.0–17.0)
Hemoglobin: 13.8 g/dL (ref 13.0–17.0)
MCH: 25.9 pg — ABNORMAL LOW (ref 26.0–34.0)
MCH: 26.2 pg (ref 26.0–34.0)
MCHC: 32.3 g/dL (ref 30.0–36.0)
MCHC: 32.7 g/dL (ref 30.0–36.0)
MCV: 80.3 fL (ref 78.0–100.0)
MCV: 80.2 fL (ref 78.0–100.0)
Platelets: 257 10*3/uL (ref 150–400)
Platelets: 253 10*3/uL (ref 150–400)
RBC: 5.17 MIL/uL (ref 4.22–5.81)
RBC: 5.26 MIL/uL (ref 4.22–5.81)
RDW: 17.3 % — ABNORMAL HIGH (ref 11.5–15.5)
RDW: 17.3 % — ABNORMAL HIGH (ref 11.5–15.5)
WBC: 7 10*3/uL (ref 4.0–10.5)
WBC: 5.9 10*3/uL (ref 4.0–10.5)

## 2017-01-30 LAB — URINALYSIS, COMPLETE (UACMP) WITH MICROSCOPIC
Bilirubin Urine: NEGATIVE
Glucose, UA: NEGATIVE mg/dL
Hgb urine dipstick: NEGATIVE
Ketones, ur: NEGATIVE mg/dL
Leukocytes, UA: NEGATIVE
Nitrite: NEGATIVE
Protein, ur: 100 mg/dL — AB
Specific Gravity, Urine: 1.018 (ref 1.005–1.030)
pH: 5 (ref 5.0–8.0)

## 2017-01-30 LAB — RAPID URINE DRUG SCREEN, HOSP PERFORMED
Amphetamines: NOT DETECTED
Barbiturates: NOT DETECTED
Benzodiazepines: NOT DETECTED
Cocaine: NOT DETECTED
Opiates: NOT DETECTED
Tetrahydrocannabinol: NOT DETECTED

## 2017-01-30 LAB — AMMONIA: Ammonia: 42 umol/L — ABNORMAL HIGH (ref 9–35)

## 2017-01-30 LAB — TSH: TSH: 1.674 u[IU]/mL (ref 0.350–4.500)

## 2017-01-30 LAB — I-STAT CG4 LACTIC ACID, ED: Lactic Acid, Venous: 0.84 mmol/L (ref 0.5–1.9)

## 2017-01-30 LAB — ETHANOL: Alcohol, Ethyl (B): <10 mg/dL (ref <10)

## 2017-01-30 LAB — BRAIN NATRIURETIC PEPTIDE: B Natriuretic Peptide: 3205.4 pg/mL — ABNORMAL HIGH (ref 0.0–100.0)

## 2017-01-30 MED ORDER — LORAZEPAM 2 MG/ML IJ SOLN
INTRAMUSCULAR | Status: AC
  Filled 2017-01-30: qty 1

## 2017-01-30 MED ORDER — LORAZEPAM 2 MG/ML IJ SOLN
1.0000 mg | Freq: Once | INTRAMUSCULAR | Status: AC | PRN
  Administered 2017-01-30: 1 mg via INTRAVENOUS

## 2017-01-30 MED ORDER — SODIUM CHLORIDE 0.9 % IV BOLUS (SEPSIS)
500.0000 mL | Freq: Once | INTRAVENOUS | Status: AC
  Administered 2017-01-30: 500 mL via INTRAVENOUS

## 2017-01-30 MED ORDER — SACUBITRIL-VALSARTAN 49-51 MG PO TABS
1.0000 | ORAL_TABLET | Freq: Two times a day (BID) | ORAL | Status: DC
  Administered 2017-02-01 – 2017-02-07 (×13): 1 via ORAL
  Filled 2017-01-30 (×19): qty 1

## 2017-01-30 MED ORDER — PIPERACILLIN-TAZOBACTAM 3.375 G IVPB 30 MIN
3.3750 g | Freq: Once | INTRAVENOUS | Status: AC
  Administered 2017-01-30: 3.375 g via INTRAVENOUS
  Filled 2017-01-30: qty 50

## 2017-01-30 MED ORDER — ENOXAPARIN SODIUM 40 MG/0.4ML ~~LOC~~ SOLN
40.0000 mg | SUBCUTANEOUS | Status: DC
  Administered 2017-01-30 – 2017-02-06 (×8): 40 mg via SUBCUTANEOUS
  Filled 2017-01-30 (×8): qty 0.4

## 2017-01-30 MED ORDER — BISOPROLOL FUMARATE 5 MG PO TABS
5.0000 mg | ORAL_TABLET | Freq: Two times a day (BID) | ORAL | Status: DC
  Administered 2017-02-01 – 2017-02-07 (×13): 5 mg via ORAL
  Filled 2017-01-30 (×18): qty 1

## 2017-01-30 MED ORDER — FUROSEMIDE 40 MG PO TABS: 40.0000 mg | ORAL_TABLET | Freq: Two times a day (BID) | ORAL | Status: DC

## 2017-01-30 MED ORDER — ATORVASTATIN CALCIUM 40 MG PO TABS
40.0000 mg | ORAL_TABLET | Freq: Every day | ORAL | Status: DC
  Administered 2017-02-01 – 2017-02-06 (×6): 40 mg via ORAL
  Filled 2017-01-30 (×8): qty 1

## 2017-01-30 MED ORDER — DORZOLAMIDE HCL-TIMOLOL MAL 2-0.5 % OP SOLN
1.0000 [drp] | Freq: Two times a day (BID) | OPHTHALMIC | Status: DC
  Administered 2017-01-31 – 2017-02-07 (×16): 1 [drp] via OPHTHALMIC
  Filled 2017-01-30: qty 10

## 2017-01-30 MED ORDER — ASPIRIN 325 MG PO TABS
325.0000 mg | ORAL_TABLET | Freq: Every day | ORAL | Status: DC
  Administered 2017-02-01 – 2017-02-07 (×7): 325 mg via ORAL
  Filled 2017-01-30 (×8): qty 1

## 2017-01-30 MED ORDER — VANCOMYCIN HCL 10 G IV SOLR
1750.0000 mg | Freq: Once | INTRAVENOUS | Status: AC
  Administered 2017-01-30: 1750 mg via INTRAVENOUS
  Filled 2017-01-30: qty 1750

## 2017-01-30 MED ORDER — TAMSULOSIN HCL 0.4 MG PO CAPS
0.8000 mg | ORAL_CAPSULE | Freq: Every day | ORAL | Status: DC
  Administered 2017-02-01 – 2017-02-07 (×7): 0.8 mg via ORAL
  Filled 2017-01-30 (×8): qty 2

## 2017-01-30 MED ORDER — LEVETIRACETAM 750 MG PO TABS
750.0000 mg | ORAL_TABLET | Freq: Two times a day (BID) | ORAL | Status: DC
  Filled 2017-01-30: qty 1

## 2017-01-30 MED ORDER — BRIMONIDINE TARTRATE 0.2 % OP SOLN
1.0000 [drp] | Freq: Two times a day (BID) | OPHTHALMIC | Status: DC
  Administered 2017-01-31 – 2017-02-07 (×16): 1 [drp] via OPHTHALMIC
  Filled 2017-01-30 (×2): qty 5

## 2017-01-30 MED ORDER — PRO-STAT SUGAR FREE PO LIQD
30.0000 mL | Freq: Two times a day (BID) | ORAL | Status: DC
  Administered 2017-02-01 – 2017-02-02 (×3): 30 mL via ORAL
  Filled 2017-01-30 (×5): qty 30

## 2017-01-30 MED ORDER — QUETIAPINE FUMARATE 25 MG PO TABS
25.0000 mg | ORAL_TABLET | Freq: Two times a day (BID) | ORAL | Status: DC
  Filled 2017-01-30 (×2): qty 1

## 2017-01-30 MED ORDER — VANCOMYCIN HCL IN DEXTROSE 1-5 GM/200ML-% IV SOLN
1000.0000 mg | Freq: Two times a day (BID) | INTRAVENOUS | Status: DC
  Administered 2017-01-31 – 2017-02-02 (×5): 1000 mg via INTRAVENOUS
  Filled 2017-01-30 (×5): qty 200

## 2017-01-30 MED ORDER — SODIUM CHLORIDE 0.9 % IV SOLN
750.0000 mg | Freq: Two times a day (BID) | INTRAVENOUS | Status: DC
  Administered 2017-01-30 – 2017-02-01 (×5): 750 mg via INTRAVENOUS
  Filled 2017-01-30 (×6): qty 7.5

## 2017-01-30 MED ORDER — PIPERACILLIN-TAZOBACTAM 3.375 G IVPB
3.3750 g | Freq: Three times a day (TID) | INTRAVENOUS | Status: DC
  Administered 2017-01-31 – 2017-02-02 (×7): 3.375 g via INTRAVENOUS
  Filled 2017-01-30 (×8): qty 50

## 2017-01-30 NOTE — ED Notes (Signed)
Spoke with Dr.Gunadasa in regards to patient sudden onset combative behavior and confusion and elevated BP; No new orders for now and holding oral medications;

## 2017-01-30 NOTE — ED Notes (Signed)
Delay in lab draw,  Need holding assistance.

## 2017-01-30 NOTE — Progress Notes (Signed)
Pharmacy Antibiotic Note  [name] is a 61 y.o. male admitted on 01/30/2017 with cellulitis.  Pharmacy has been consulted for Vanc dosing. 95.5degF, SCr 0.93, WBC 7.0, LA 0.84, CrCl calculated 72ml/min   Plan: Vanc 1750mg  IV X1 THEN 1000mg  IV Q12H. Vanc trough 10-15 ml/min.   F/U renal function, clinical status, C&S, vanc trough at SS    Height: 5\' 10"  (177.8 cm) Weight: 195 lb (88.5 kg) IBW/kg (Calculated) : 73  Temp (24hrs), Avg:95.5 F (35.3 C), Min:95.5 F (35.3 C), Max:95.5 F (35.3 C)  Recent Labs  Lab 01/30/17 1310 01/30/17 1326  WBC 7.0  --   CREATININE 0.93  --   LATICACIDVEN  --  0.84    Estimated Creatinine Clearance: 94.6 mL/min (by C-G formula based on SCr of 0.93 mg/dL).    No Known Allergies  Antimicrobials this admission: Vanc 11/28>>  Dose adjustments this admission:  Microbiology results: 11/28 BCx:   Thank you for allowing pharmacy to be a part of this patient's care.  Felicity Pellegrini  PharmD Candidate '19 01/07/2017 3:30 PM

## 2017-01-30 NOTE — ED Notes (Signed)
Family member - [phone] Delbert Phenix)

## 2017-01-30 NOTE — H&P (Signed)
[facility] Admission History and Physical Service Pager: [phone]  Patient name: [name] Medical record number: [id number] Date of birth: Dec 15, 1955 Age: 61 y.o. Gender: male  Primary Care Provider: System, Pcp Not In Consultants: None Code Status: Full  Chief Complaint:   AMS with slurred speech for 2 weeks, right UE swelling, anasarca  Assessment and Plan: [name] is a 61 y.o. male presenting with worsening slurred speech for the past 2 weeks with right arm swelling . 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  AMS?/Slurred speech/Hypothermia:  Patient has history of "not acting right" per nursing home at [facility]. Patient was A&O x 3 on my exam. Patient does endorse two weeks of worsening "slurred speech". CT Head was done and showed multiple small chronic infarcts, chronic ischemic changes, and parenchymal volume loss of the brain. Potential etiologies include acute ischemic stroke but he has no weakness in this extremities or facial droop. Sepsis is another possibility as patient did have cellulitis that was treated as possible source of infection. Patient did present with hypothermia but had a normal WBC, not tachycardic, and no tachypnea. Additionally, another possible etiology to consider is seizure. Patient has diagnosis of possible seizure DO that was diagnosed around 10/2016. At that time patient was hospitalized for seizure like activity (left arm jerking), slurred speech and left sided weakness. It was recommended that he start Keppra and full dose ASA and follow up with neurology as outpatient for possible loop recorder. Discussed his hypothermia with Dr. Jimmy Footman; since he was alert and oriented there is no indication to raise body temperature quickly. Recommended continuing bear hugger and/or doing warming blankets.  - Admit to Step Down,  attending Dr. Nori Riis - Obtain MRI of the brain to rule out stroke - Follow BCx - Vanc started in ED, dosing per pharm - start Zosyn as well in the setting of hypothermia. Broad spectrum until we are sure of source. - am CBC - keppra level   Right Upper Extremity Swelling I Anasarca:  Patient has finished course of antibiotics for cellulitis of his right upper extremity. It was not warm but he is hypothermic. His arm does appear erythematous. CXR showed cardiomegaly with small dependent effusions and some left lower lobe volume loss. Likely due to a combination of overall edematous state and cellulitis. He has trace to pitting edema on his left arm and in both thighs bilaterally as well as sacral edema, and in his left leg. Cannot rule out osteomyelitis but less likely. Less likely b/c I would expect a WBC and/or fever. Swelling could be due to worsening kidney function but this is less likely as his Cr is normal. Possible that he is becoming hypothyroid and may be presenting with the initial stages of myxedema coma. Additionally no history of liver cirrhosis; patient reports of significant alcohol use in the past but has been abstinent for 20 years. CT abdomen in 2013 with normal liver. Also, he does have protein calorie malnutrition with low albumin to 2.3 (chronically low). Another possibility is worsening right heart failure causing anasarca. His last ECHO in 09/2016 showed EF 20-25%, diffuse LV hypokinesis, and elevated pulmonary artery pressure to 75mmHg. However this by itself does not explain why his right upper extremity is more swollen than left.   - Consider IV Lasix 40mg  to help with edema. For now continue home PO Lasix 40mg   BID as we are still working up his symptoms, his etiology is unclear, and no urgency to start IV lasix as he is not in any respiratory distress/pulmonary edema.  - Vanc and Zosyn - F/u BMP - TSH - BNP - Morning cortisol  - consider repeat ECHO  CHF: Patient does not  appear to be in acute exacerbation, however he did present with atypical symptoms at last admission in acute exacerbation b/c he quit taking his meds. He denies any chest pain, SOB, but he does have significant edema in both upper extremities and his right leg. His overall weight is at 195lbs which appears to be his baseline. He has unlabored breathing, normal respiratory rate, and normal heart rate. CXR showed cardiomegaly with questionable small dependent effusions and some left lower lobe volume loss. Continue to monitor. - Continue home meds Bisoprolol, Entresto - Strict I's/O's - Daily weights  HTN: BP has been poorly well controlled since admission with sys [id number]; diastolic 54-627. - Continue home meds Bisoprolol 5mg  BID and Entresto BID - Consider increasing Bisoprolol if diastolic continues to be elevated  HLD: Patient has history of hyperlipidemia well controlled on statin. - Continue home Atorvastatin 40mg  daily  T2DM: BG serum well controlled on admission at 89. Patient takes metformin 500mg  BID. A1c is 8.0. - CBGs at bedtime and before meals - sSSI and monitor CBGs.   Elevated Alkaline Phosphatase: chronic. No prior GGT on file - GGT  Seizure Disorder: Patient has no symptoms of seizure and has remained alert and oriented since admission. - Continue Levetiracetam 750mg  BID  Urinary Retention: Patient is taking flomax at nursing home to help with urinary retention. - Continue Flomax   FEN/GI: heart healthy, carb modified Prophylaxis: Lovenox  Disposition: Stepdown  History of Present Illness:  [name] is a 61 y.o. male presenting with altered mental status per nurse at nursing home and progressively worse slurred speech. He states he has no pain and he is able to account for why he came to the hospital. He is difficulty to understand but per patient his right arm been swollen for the past 2 weeks but it is not painful. He says he was brought in due to his  "declining health" and his speech was deteriorating. He says this has been ongoing but the declining speech has been noticiable to him for 2 weeks. He denies any weakness in his limbs. He denies any SOB, chest pain, difficulty breathing, fever, chills, nausea, vomiting.    [place] to obtain more information. Patient's night nurse reports that patient started to have right hand swelling for about 1 week. X-ray of hand showed soft tissue swelling. He was started on Keflex for cellulitis of the right hand on 11/23. Nurse reports that symptoms including swelling and redness were worsening. The nurse mentioned the morning nurse reported of patient having worsened slurred speech, was not as alert, and possibly had some drooping of his face. Additionally the morning nurse thought his face started swelling and this was why he was sent to the ED. We reviewed his medication over the phone. Only new medication other than Keflex was Seroquel 25mg  BID which was started on 11/21. Haldol PRN was discontinued and started on Ativan PRN but had not received any doses of this.   Review Of Systems: Per HPI with the following additions:   Review of Systems  Constitutional: Negative for chills, fever and weight loss.  HENT: Positive for congestion.   Eyes: Negative for blurred  vision.  Respiratory: Negative for cough, sputum production and shortness of breath.   Cardiovascular: Positive for leg swelling. Negative for chest pain and orthopnea.  Gastrointestinal: Positive for diarrhea. Negative for abdominal pain, nausea and vomiting.  Neurological: Negative for weakness.    Patient Active Problem List   Diagnosis Date Noted  . Hypothermia 01/30/2017  . Pressure injury of skin 12/21/2016  . CHF exacerbation ([place]) 12/20/2016  . Peripheral edema   . Benign prostatic hyperplasia with lower urinary tract symptoms   . Embolic stroke ([place]) 95/18/8416  . 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 Acute on  chronic systolic heart failure, NYHA class 3 ([place]) 10/19/2016  . Moderate to severe pulmonary hypertension ([place]) 10/19/2016  . Severe tricuspid regurgitation by prior echocardiogram 10/19/2016  . Diabetes mellitus type 2 in obese ([place]) 10/19/2016  . HLD (hyperlipidemia) 10/19/2016  . Seizure ([place]) 10/19/2016  . Fluid overload 10/14/2016  . 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 Cardiorenal syndrome with renal failure 09/28/2016  . Acute urinary retention 09/26/2016  . Acute on chronic combined systolic and diastolic CHF (congestive heart failure) ([place]) 09/26/2016  . Scrotal edema 09/24/2016  . Multiple open wounds of lower leg, initial encounter 09/24/2016  . Tinea of groin 09/24/2016  . Chronic kidney disease (CKD), stage III (moderate) ([place]) 06/07/2016  . Chronic combined systolic and diastolic congestive heart failure ([place])   . Protein calorie malnutrition ([place]) 07/12/2015  . Congestive dilated cardiomyopathy ([place]) 07/11/2015  . Pressure ulcer 07/09/2015  . Open knee wound 07/06/2015  . History of right below knee amputation ([place]) 02/22/2015  . PAD (peripheral artery disease) ([place]) 02/22/2015  . Blindness of left eye 01/04/2015  . Complications, amputation stump late ([place]) 08/06/2014  . Diabetic neuropathy associated with type 2 diabetes mellitus ([place]) 12/01/2013  . Noncompliance with medications 07/13/2010  . OTHER SPEC TYPES SCHIZOPHRENIA UNSPEC CONDITION 05/24/2009  . Obesity, unspecified 06/09/2007  . DM (diabetes mellitus), type 2, uncontrolled ([place]) 05/02/2006  . Essential hypertension 05/02/2006    Past Medical History: Past Medical History:  Diagnosis Date  . Acute on chronic systolic heart failure, NYHA class 3 ([place])   . AKI (acute kidney injury) ([place])   . Anemia   . BKA stump complication ([place])   . Blind left eye   . Blind left eye   . Cardiomyopathy, dilated ([place])   . CHF (congestive heart  failure) ([place])   . Combined systolic and diastolic congestive heart failure ([place])   . Congestive dilated cardiomyopathy ([place]) 07/11/2015  . Diabetes mellitus   . Diabetic neuropathy associated with type 2 diabetes mellitus ([place])   . Hypertension   . Neuropathy   . Noncompliance with medications   . Open knee wound 10/2015   on rt bka  . PAD (peripheral artery disease) ([place])   . Protein calorie malnutrition ([place])   . Shortness of breath dyspnea     Past Surgical History: Past Surgical History:  Procedure Laterality Date  . AMPUTATION Right 09/26/2013   Procedure: AMPUTATION BELOW KNEE;  Surgeon: Rozanna Box, MD;  Location: [place];  Service: Orthopedics;  Laterality: Right;  . AMPUTATION Right 05/01/2015   Procedure: REVISION OF RIGHT TRANSTIBIAL AMPUTATION ;  Surgeon: Newt Minion, MD;  Location: [place];  Service: Orthopedics;  Laterality: Right;  . APPLICATION OF WOUND VAC  08/10/2016   Procedure: APPLICATION OFI NCISONAL  WOUND VAC;  Surgeon: Newt Minion, MD;  Location: [facility]  OR;  Service: Orthopedics;;  . CARDIAC CATHETERIZATION N/A 07/13/2015   Procedure: Right/Left Heart Cath and Coronary Angiography;  Surgeon: Jolaine Artist, MD;  Location: [place] CV LAB;  Service: Cardiovascular;  Laterality: N/A;  . COLONOSCOPY    . I&D EXTREMITY Right 09/24/2013   Procedure: IRRIGATION AND DEBRIDEMENT RIGHT FOOT ULCER;  Surgeon: Renette Butters, MD;  Location: [place];  Service: Orthopedics;  Laterality: Right;  . I&D EXTREMITY Right 09/26/2013   Procedure: Repeat IRRIGATION AND DEBRIDEMENT Right Foot Ulcer;  Surgeon: Rozanna Box, MD;  Location: [place];  Service: Orthopedics;  Laterality: Right;  . MULTIPLE TOOTH EXTRACTIONS    . SKIN SPLIT GRAFT Right 08/10/2016   Procedure: SKIN GRAFT SPLIT THICKNESS RIGHT LEG;  Surgeon: Newt Minion, MD;  Location: [place];  Service: Orthopedics;  Laterality: Right;  . STUMP REVISION Right 04/20/2015   Procedure: Revision Right Below Knee Amputation,  Apply Wound VAC;  Surgeon: Newt Minion, MD;  Location: [place];  Service: Orthopedics;  Laterality: Right;  . TONSILLECTOMY      Social History: Social History   Tobacco Use  . Smoking status: Former Smoker    Years: 20.00    Last attempt to quit: 05/10/2000    Years since quitting: 16.7  . Smokeless tobacco: Former Systems developer    Quit date: 02/22/2000  Substance Use Topics  . Alcohol use: No    Alcohol/week: 0.0 oz  . Drug use: No   Additional social history: No Alcohol use in over 20 years Please also refer to relevant sections of EMR.  Family History: Family History  Problem Relation Age of Onset  . Cancer Mother   . Peripheral vascular disease Father      Allergies and Medications: No Known Allergies No current facility-administered medications on file prior to encounter.    Current Outpatient Medications on File Prior to Encounter  Medication Sig Dispense Refill  . Amino Acids-Protein Hydrolys (PRO-STAT) LIQD Take 30 mLs by mouth 2 (two) times daily.    Marland Kitchen aspirin 325 MG tablet Take 1 tablet (325 mg total) by mouth daily. 30 tablet 0  . atorvastatin (LIPITOR) 40 MG tablet Take 1 tablet (40 mg total) by mouth daily. (Patient taking differently: Take 40 mg by mouth at bedtime. ) 30 tablet 0  . bisoprolol (ZEBETA) 5 MG tablet Take 1 tablet (5 mg total) by mouth daily. (Patient taking differently: Take 5 mg by mouth 2 (two) times daily. ) 30 tablet 1  . brimonidine (ALPHAGAN) 0.2 % ophthalmic solution PLACE 1 DROP INTO THE LEFT EYE 2 (TWO) TIMES DAILY. 5 mL 0  . cephALEXin (KEFLEX) 500 MG capsule Take 500 mg by mouth 2 (two) times daily. FOR 14 DAYS FOR RIGHT ARM SWELLING    . dorzolamide-timolol (COSOPT) 22.3-6.8 MG/ML ophthalmic solution Place 1 drop into the left eye 2 (two) times daily. 10 mL 12  . furosemide (LASIX) 40 MG tablet Take 1 tablet (40 mg total) by mouth 2 (two) times daily. 60 tablet 0  . levETIRAcetam (KEPPRA) 750 MG tablet Take 1 tablet (750 mg total) by mouth 2  (two) times daily. 60 tablet 0  . LORazepam (ATIVAN) 0.5 MG tablet Take 0.5 mg by mouth every 6 (six) hours as needed for anxiety. FOR 14 DAYS    . metFORMIN (GLUCOPHAGE) 500 MG tablet Take 1 tablet (500 mg total) by mouth 2 (two) times daily with a meal. 60 tablet 6  . Multiple Vitamins-Minerals (DECUBI-VITE) CAPS Take 1  capsule by mouth daily.    . QUEtiapine (SEROQUEL) 25 MG tablet Take 25 mg by mouth every 12 (twelve) hours.    . sacubitril-valsartan (ENTRESTO) 49-51 MG Take 1 tablet by mouth 2 (two) times daily. 60 tablet 3  . tamsulosin (FLOMAX) 0.4 MG CAPS capsule Take 2 capsules (0.8 mg total) by mouth daily after supper. 60 capsule 0  . vitamin A 10000 UNIT capsule Take 10,000 Units by mouth daily.    . vitamin C (ASCORBIC ACID) 500 MG tablet Take 500 mg by mouth daily.    Marland Kitchen zinc sulfate 220 (50 Zn) MG capsule Take 220 mg by mouth daily.    . bacitracin ointment Apply topically 2 (two) times daily. (Patient not taking: Reported on 01/30/2017) 120 g 0    Objective: BP (!) 153/101   Pulse 72   Temp (!) 95.7 F (35.4 C) (Rectal)   Resp 13   Ht 5\' 10"  (1.778 m)   Wt 195 lb (88.5 kg)   SpO2 92%   BMI 27.98 kg/m  Exam: General: A&O x 3, NAD Eyes: left eye cataract, right eye pupil is round but poor reactivity to light ENTM: non-erythematous non-swollen mucosa, poor dentition Cardiovascular: RRR, normal S1, S2, no murmurs Respiratory: CTAB, no wheezing, no crackles Gastrointestinal: non-distended, non-tender, soft, obese abdomen MSK: rBKA, left lower leg and foot have many non-healing wounds Derm: non-healing wounds on the left lower extremity, diffusely below the knee to the foot. Neuro: CN II-IIX grossly intact, 5/5 strength in UE bilaterally, LLE 5/5 strength, no focal defects   Right arm  Right arm  Left Foot  Right Knee  Left Knee   Labs and Imaging: CBC BMET  Recent Labs  Lab 01/30/17 1310  WBC 7.0  HGB 13.4  HCT 41.5  PLT 257   Recent Labs  Lab  01/30/17 1310  NA 140  K 3.7  CL 108  CO2 27  BUN 24*  CREATININE 0.93  GLUCOSE 89  CALCIUM 8.4*     11/28 - Ammonia: 42 11/28 - Lactic Acid: 0.84 11/28 - BCx: pending 11/28 - U/A: 100 protein, bacteria and 0-5 squamous 11/28 - UDS: negative 11/28 - TSH: pending 11/28 - Cortisol (am): pending  EKG- low voltage, NSR, otherwise unremarkable   11/28 - CT w/o Contrast Head: IMPRESSION: 1. No acute intracranial abnormality identified. 2. Multiple small chronic infarcts are stable. Stable chronic microvascular ischemic changes and parenchymal volume loss of the brain. 3. Increased density of right globe may represent vitreous hemorrhage of silicon tamponade, clinical correlation recommended.  11/28 - CXR: IMPRESSION: Question small dependent effusions and some left lower lobe volume loss. Cardiomegaly.  Nuala Alpha, DO 01/30/2017, 7:11 PM PGY-1, [place] Intern pager: [phone], text pages welcome  UPPER LEVEL ADDENDUM  I have read the above note and made revisions highlighted in blue.  Smiley Houseman, MD PGY-2 [facility] Pager [phone]

## 2017-01-30 NOTE — Progress Notes (Signed)
RUE venous duplex prelim: negative for DVT and SVT. Jill Eunice, RDMS, RVT  

## 2017-01-30 NOTE — ED Notes (Signed)
Patient returned to Ed and unable to complete MRI due to combative behavior; pt trying to climb out of bed and asking about how to get [place] drivers licence; Pt unable to tell RN where he is at this time; RN fail patient on Stroke swallow screen at this time;

## 2017-01-30 NOTE — ED Notes (Signed)
Admitting MD at bedside regarding new onset of combativeness, evlevated BP and confusion; Patient able to follow commands with repeated direction; Patient has slurred speech but able to understand some words; Pt has good grips and able too hold arms and leg  Up without assistance; Patient thinks he is still at [facility]"; pt able to state current year and city;

## 2017-01-30 NOTE — Progress Notes (Signed)
FPTS Interim Progress Note  Paged by RN due to increased combativeness after given Ativan for MRI. Patient unable to complete MRI and was reportedly climbing off the table and talking incoherently. On exam, patient was lying on ED stretcher, fidgety, attempting to remove pulse ox, but was redirectable and able to follow commands. Oriented to person, time, stated "retirement" in response to place. Agitation likely medication induced as occurred after receiving Ativan. Will plan to continue closely monitor mental status, vitals. Continue antibiotics.  Rory Percy, DO 01/30/2017, 11:55 PM PGY-1, [facility] Service pager [phone]

## 2017-01-30 NOTE — ED Triage Notes (Addendum)
Pt in from [facility] via [facility] with AMS since 11am this morning. Per EMS, pt was seen at this time by his wound nurse. His nurse stated pt was just not "acting right". Baseline a&ox3 per EMS, currently awake, verbal moans with painful stimuli. Able to MAE's equally. Recently treated for cellulitis of R arm, finished all abx. BP 100 palpated en route, rectal temp 95.5. Placed on 2L [place] sats 90%

## 2017-01-30 NOTE — ED Provider Notes (Signed)
[facility] EMERGENCY DEPARTMENT Provider Note   CSN: [id number] Arrival date & time: 01/30/17  1223     History   Chief Complaint Chief Complaint  Patient presents with  . Altered Mental Status    HPI [name] is a 61 y.o. male.  Patient with h/o HFrEF, pulm HTN, DM, PVD with chronic leg wounds, R BKA --presents to the emergency department with altered mental status.  Level 5 caveat due to altered mental status.  I spoke with patient's nurse at [facility] he states that he has had swelling and redness in his right upper extremity for the past several days.  He was started on treatment for cellulitis with Augmentin.  Today the patient was acutely altered with unclear speech prompting emergency department visit.      Past Medical History:  Diagnosis Date  . Acute on chronic systolic heart failure, NYHA class 3 ([place])   . AKI (acute kidney injury) ([place])   . Anemia   . BKA stump complication ([place])   . Blind left eye   . Blind left eye   . 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 CHF (congestive heart failure) ([place])   . Combined systolic and diastolic congestive heart failure ([place])   . Congestive dilated cardiomyopathy ([place]) 07/11/2015  . Diabetes mellitus   . Diabetic neuropathy associated with type 2 diabetes mellitus ([place])   . Hypertension   . Neuropathy   . Noncompliance with medications   . Open knee wound 10/2015   on rt bka  . PAD (peripheral artery disease) ([place])   . Protein calorie malnutrition ([place])   . Shortness of breath dyspnea     Patient Active Problem List   Diagnosis Date Noted  . Pressure injury of skin 12/21/2016  . CHF exacerbation ([place]) 12/20/2016  . Peripheral edema   . Benign prostatic hyperplasia with lower urinary tract symptoms   . Embolic stroke ([place]) 40/97/3532  . 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 Acute on chronic systolic heart failure, NYHA class 3 ([place]) 10/19/2016  . Moderate to severe pulmonary  hypertension ([place]) 10/19/2016  . Severe tricuspid regurgitation by prior echocardiogram 10/19/2016  . Diabetes mellitus type 2 in obese ([place]) 10/19/2016  . HLD (hyperlipidemia) 10/19/2016  . Seizure ([place]) 10/19/2016  . Fluid overload 10/14/2016  . 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 Cardiorenal syndrome with renal failure 09/28/2016  . Acute urinary retention 09/26/2016  . Acute on chronic combined systolic and diastolic CHF (congestive heart failure) ([place]) 09/26/2016  . Scrotal edema 09/24/2016  . Multiple open wounds of lower leg, initial encounter 09/24/2016  . Tinea of groin 09/24/2016  . Chronic kidney disease (CKD), stage III (moderate) ([place]) 06/07/2016  . Chronic combined systolic and diastolic congestive heart failure ([place])   . Protein calorie malnutrition ([place]) 07/12/2015  . Congestive dilated cardiomyopathy ([place]) 07/11/2015  . Pressure ulcer 07/09/2015  . Open knee wound 07/06/2015  . History of right below knee amputation ([place]) 02/22/2015  . PAD (peripheral artery disease) ([place]) 02/22/2015  . Blindness of left eye 01/04/2015  . Complications, amputation stump late ([place]) 08/06/2014  . Diabetic neuropathy associated with type 2 diabetes mellitus ([place]) 12/01/2013  . Noncompliance with medications 07/13/2010  . OTHER SPEC TYPES SCHIZOPHRENIA UNSPEC CONDITION 05/24/2009  . Obesity, unspecified 06/09/2007  . DM (diabetes mellitus), type 2, uncontrolled ([place]) 05/02/2006  . Essential hypertension 05/02/2006    Past Surgical History:  Procedure  Laterality Date  . AMPUTATION Right 09/26/2013   Procedure: AMPUTATION BELOW KNEE;  Surgeon: Rozanna Box, MD;  Location: [facility];  Service: Orthopedics;  Laterality: Right;  . AMPUTATION Right 05/01/2015   Procedure: REVISION OF RIGHT TRANSTIBIAL AMPUTATION ;  Surgeon: Newt Minion, MD;  Location: [facility];  Service: Orthopedics;  Laterality: Right;  . APPLICATION  OF WOUND VAC  08/10/2016   Procedure: APPLICATION OFI NCISONAL  WOUND VAC;  Surgeon: Newt Minion, MD;  Location: [facility];  Service: Orthopedics;;  . CARDIAC CATHETERIZATION N/A 07/13/2015   Procedure: Right/Left Heart Cath and Coronary Angiography;  Surgeon: Jolaine Artist, MD;  Location: [facility];  Service: Cardiovascular;  Laterality: N/A;  . COLONOSCOPY    . I&D EXTREMITY Right 09/24/2013   Procedure: IRRIGATION AND DEBRIDEMENT RIGHT FOOT ULCER;  Surgeon: Renette Butters, MD;  Location: [facility];  Service: Orthopedics;  Laterality: Right;  . I&D EXTREMITY Right 09/26/2013   Procedure: Repeat IRRIGATION AND DEBRIDEMENT Right Foot Ulcer;  Surgeon: Rozanna Box, MD;  Location: [facility];  Service: Orthopedics;  Laterality: Right;  . MULTIPLE TOOTH EXTRACTIONS    . SKIN SPLIT GRAFT Right 08/10/2016   Procedure: SKIN GRAFT SPLIT THICKNESS RIGHT LEG;  Surgeon: Newt Minion, MD;  Location: [facility];  Service: Orthopedics;  Laterality: Right;  . STUMP REVISION Right 04/20/2015   Procedure: Revision Right Below Knee Amputation, Apply Wound VAC;  Surgeon: Newt Minion, MD;  Location: [facility];  Service: Orthopedics;  Laterality: Right;  . TONSILLECTOMY         Home Medications    Prior to Admission medications   Medication Sig Start Date End Date Taking? Authorizing Provider  aspirin 325 MG tablet Take 1 tablet (325 mg total) by mouth daily. 10/21/16   Raiford Noble Latif, DO  atorvastatin (LIPITOR) 40 MG tablet Take 1 tablet (40 mg total) by mouth daily. 10/19/16   Hosie Poisson, MD  bacitracin ointment Apply topically 2 (two) times daily. 10/18/16   Hosie Poisson, MD  bisoprolol (ZEBETA) 5 MG tablet Take 1 tablet (5 mg total) by mouth daily. 10/19/16   Hosie Poisson, MD  brimonidine (ALPHAGAN) 0.2 % ophthalmic solution PLACE 1 DROP INTO THE LEFT EYE 2 (TWO) TIMES DAILY. 06/15/15   Mariel Aloe, MD  dorzolamide-timolol (COSOPT) 22.3-6.8 MG/ML ophthalmic solution Place 1 drop into the left eye 2  (two) times daily. 09/28/13   Virginia Crews, MD  furosemide (LASIX) 40 MG tablet Take 1 tablet (40 mg total) by mouth 2 (two) times daily. 12/31/16   Caroline More, DO  levETIRAcetam (KEPPRA) 750 MG tablet Take 1 tablet (750 mg total) by mouth 2 (two) times daily. 10/20/16   Raiford Noble Latif, DO  metFORMIN (GLUCOPHAGE) 500 MG tablet Take 1 tablet (500 mg total) by mouth 2 (two) times daily with a meal. 10/10/15   Clegg, Amy D, NP  sacubitril-valsartan (ENTRESTO) 49-51 MG Take 1 tablet by mouth 2 (two) times daily. 08/23/16   Arbutus Leas, NP  tamsulosin (FLOMAX) 0.4 MG CAPS capsule Take 2 capsules (0.8 mg total) by mouth daily after supper. 12/31/16   Caroline More, DO  vitamin A 10000 UNIT capsule Take 10,000 Units by mouth daily.    [provider]    Family History Family History  Problem Relation Age of Onset  . Cancer Mother   . Peripheral vascular disease Father     Social History Social History   Tobacco Use  .  Smoking status: Former Smoker    Years: 20.00    Last attempt to quit: 05/10/2000    Years since quitting: 16.7  . Smokeless tobacco: Former Systems developer    Quit date: 02/22/2000  Substance Use Topics  . Alcohol use: No    Alcohol/week: 0.0 oz  . Drug use: No     Allergies   Patient has no known allergies.   Review of Systems Review of Systems  Unable to perform ROS: Mental status change     Physical Exam Updated Vital Signs BP (!) 154/110   Pulse 65   Temp (!) 95.5 F (35.3 C) (Rectal)   Resp 13   Ht 5\' 10"  (1.778 m)   Wt 88.5 kg (195 lb)   SpO2 96%   BMI 27.98 kg/m   Physical Exam  Constitutional: He appears well-developed and well-nourished.  HENT:  Head: Normocephalic and atraumatic.  Right Ear: Tympanic membrane, external ear and ear canal normal.  Left Ear: Tympanic membrane, external ear and ear canal normal.  Nose: Nose normal.  Mouth/Throat: Uvula is midline. Mucous membranes are dry.  Eyes: Conjunctivae, EOM and lids are  normal. Pupils are equal, round, and reactive to light. Right eye exhibits no discharge. Left eye exhibits no discharge.  Neck: Normal range of motion. Neck supple.  Cardiovascular: Normal rate, regular rhythm and normal heart sounds.  No murmur heard. Pulmonary/Chest: Effort normal and breath sounds normal. No stridor. No respiratory distress. He has no wheezes.  Abdominal: Soft. There is no tenderness. There is no rebound and no guarding.  Musculoskeletal: He exhibits edema.       Cervical back: He exhibits normal range of motion, no tenderness and no bony tenderness.  LE: Chronic appearing lower extremities. Left foot red without warmth. Previous R BKA.   UE: Generalized right upper extremity swelling with mild erythema and warmth.  No abscess palpated or drainage.  Neurological: He is alert. He has normal strength and normal reflexes. He displays a negative Romberg sign. Gait normal. GCS eye subscore is 4. GCS verbal subscore is 5. GCS motor subscore is 6.  Patient with change the dressing slurred speech, follows simple commands.  Skin: Skin is warm and dry.  Psychiatric: He has a normal mood and affect.  Nursing note and vitals reviewed.        ED Treatments / Results  Labs (all labs ordered are listed, but only abnormal results are displayed) Labs Reviewed  COMPREHENSIVE METABOLIC PANEL - Abnormal; Notable for the following components:      Result Value   BUN 24 (*)    Calcium 8.4 (*)    Total Protein 5.9 (*)    Albumin 2.3 (*)    Alkaline Phosphatase 233 (*)    All other components within normal limits  CBC - Abnormal; Notable for the following components:   MCH 25.9 (*)    RDW 17.3 (*)    All other components within normal limits  AMMONIA - Abnormal; Notable for the following components:   Ammonia 42 (*)    All other components within normal limits  CULTURE, BLOOD (ROUTINE X 2)  CULTURE, BLOOD (ROUTINE X 2)  URINALYSIS, COMPLETE (UACMP) WITH MICROSCOPIC  ETHANOL    RAPID URINE DRUG SCREEN, HOSP PERFORMED  CBG MONITORING, ED  CBG MONITORING, ED  I-STAT CG4 LACTIC ACID, ED   ED ECG REPORT   Date: 01/30/2017  Rate: 63  Rhythm: normal sinus rhythm  QRS Axis: normal  Intervals: normal  ST/T Wave  abnormalities: nonspecific T wave changes  Conduction Disutrbances:none  Narrative Interpretation:   Old EKG Reviewed: unchanged  I have personally reviewed the EKG tracing and agree with the computerized printout as noted.   Radiology Dg Chest 2 View  Result Date: 01/30/2017 CLINICAL DATA:  Altered level of consciousness. EXAM: CHEST  2 VIEW COMPARISON:  12/20/2016 FINDINGS: Artifact overlies chest. There is cardiomegaly. Mediastinal shadows are otherwise normal. The lungs appear clear. Cannot rule out some left lower lobe volume loss. Question small dependent effusions. Upper lungs appear clear. IMPRESSION: Question small dependent effusions and some left lower lobe volume loss. Cardiomegaly. Electronically Signed   By: Nelson Chimes M.D.   On: 01/30/2017 13:48   Ct Head Wo Contrast  Result Date: 01/30/2017 CLINICAL DATA:  61 y/o  M; altered mental status. EXAM: CT HEAD WITHOUT CONTRAST TECHNIQUE: Contiguous axial images were obtained from the base of the skull through the vertex without intravenous contrast. COMPARISON:  10/19/2016 MRI of the head. FINDINGS: Brain: No evidence of acute infarction, hemorrhage, hydrocephalus, extra-axial collection or mass lesion/mass effect. Small chronic infarcts in bilateral anterolateral frontal lobes, right posterolateral frontal lobe, left parietal lobe, and left cerebellar hemisphere. Stable chronic microvascular ischemic changes of white matter and parenchymal volume loss of the brain. Vascular: Calcific atherosclerosis of the carotid siphons. Skull: Normal. Negative for fracture or focal lesion. Sinuses/Orbits: No acute finding. Increased attenuation of right globe may represent vitreous hemorrhage or silicon  tamponade. Other: None. IMPRESSION: 1. No acute intracranial abnormality identified. 2. Multiple small chronic infarcts are stable. Stable chronic microvascular ischemic changes and parenchymal volume loss of the brain. 3. Increased density of right globe may represent vitreous hemorrhage of silicon tamponade, clinical correlation recommended. Electronically Signed   By: Kristine Garbe M.D.   On: 01/30/2017 13:38    Procedures Procedures (including critical care time)  Medications Ordered in ED Medications  sodium chloride 0.9 % bolus 500 mL (not administered)  vancomycin (VANCOCIN) 1,750 mg in sodium chloride 0.9 % 500 mL IVPB (not administered)     Initial Impression / Assessment and Plan / ED Course  I have reviewed the triage vital signs and the nursing notes.  Pertinent labs & imaging results that were available during my care of the patient were reviewed by me and considered in my medical decision making (see chart for details).     Patient seen and examined.   Vital signs reviewed and are as follows: BP (!) 154/110   Pulse 65   Temp (!) 95.5 F (35.3 C) (Rectal)   Resp 13   Ht 5\' 10"  (1.778 m)   Wt 88.5 kg (195 lb)   SpO2 96%   BMI 27.98 kg/m   Patient discussed with Dr. Sherry Ruffing. Pending [place] to r/o UE DVT. Will treat cellulitis, will need admission for delirium in setting of possible UE cellulitis.   4:07 PM [place] neg for RUE DVT.   4:39 PM Either [facility]PC or IMTS to admit. Handoff to [place] PA-C at shift change.    Final Clinical Impressions(s) / ED Diagnoses   Final diagnoses:  Cellulitis of right upper extremity   Admit.   ED Discharge Orders    None       Carlisle Cater, [place] 01/30/17 1640    Tegeler, Gwenyth Allegra, MD 01/31/17 1346

## 2017-01-30 NOTE — ED Provider Notes (Signed)
PROGRESS NOTE                                                                                                                 This is a sign-out from PA Geiple at shift change: [name] is a 61 y.o. male presenting with upper extremity erythema, altered mental status found to be hypothermic. Patient is at day 30 status post admission to family practice, presented to admit to family practice however they declined, internal medicine states it's about back because it that day 30. Discussed with family practice resident and states that he will either admit the patient were talked to internal medicine to admit the patient.     Monico Blitz, [name] 01/30/17 1721    Tegeler, Gwenyth Allegra, MD 01/30/17 2102

## 2017-01-30 NOTE — ED Notes (Signed)
Patient transported to MRI 

## 2017-01-30 NOTE — Progress Notes (Signed)
CALL PAGER [phone] for any questions or notifications regarding this patient  FMTS Attending Note: Dorcas Mcmurray MD I have reviewed this patients chart, discussed with Dr. Dallas Schimke. I agree with plan---H&P is pending. Briefly, 61 yo male noted at SNF to have acute alteration in mental status. On resident's exam this evening, he is AXOX4, has some slurred speech but otherwise does not appear altered to her. He is hypothermic. No other signs of sepsis or SIRS. He does have redness and swelling of UE which  Has been going on a few days and for which he was Rx antibiotic.His multiple medical problems are reviewed. In the ED he was started on Vancomycin for presumed UE cellulitis. Our plan is to: 1. Confirm temperature with rectal temp 2. Broaden antibiotics to include Zosyn until we have a clearer idea of what is going on. He did have a negative [place] of UE and no DVt was seen; of course he could have more proximal compression. 3. With slurred speech and episode of mental status changes reported earlier, and CT scan with unusual  Findings of right globe, insetting of recent decrease in vision from the right eye (left eye blind), will get MRI, 4. Get AM cortisol 5. BARE hugger and step down for presumed hypothermia--if rectal temp does not support this I would still get step down bed as his situation is not clear. 6. Questionable hx of seizure is noted. CV: HFrEF is noted.

## 2017-01-30 NOTE — ED Notes (Signed)
Admitting Provider at bedside; Made aware of elevated BP; home meds ordered but request Stroke Swallow screen be done prior to giving oral medications after completion of MRI

## 2017-01-31 ENCOUNTER — Inpatient Hospital Stay (HOSPITAL_COMMUNITY): Payer: Medicaid Other

## 2017-01-31 DIAGNOSIS — G9340 Encephalopathy, unspecified: Secondary | ICD-10-CM

## 2017-01-31 DIAGNOSIS — T68XXXD Hypothermia, subsequent encounter: Secondary | ICD-10-CM

## 2017-01-31 DIAGNOSIS — R4182 Altered mental status, unspecified: Secondary | ICD-10-CM | POA: Insufficient documentation

## 2017-01-31 DIAGNOSIS — R4781 Slurred speech: Secondary | ICD-10-CM | POA: Insufficient documentation

## 2017-01-31 DIAGNOSIS — L03113 Cellulitis of right upper limb: Secondary | ICD-10-CM | POA: Insufficient documentation

## 2017-01-31 DIAGNOSIS — R601 Generalized edema: Secondary | ICD-10-CM | POA: Insufficient documentation

## 2017-01-31 LAB — COMPREHENSIVE METABOLIC PANEL
ALT: 18 U/L (ref 17–63)
AST: 23 U/L (ref 15–41)
Albumin: 2.3 g/dL — ABNORMAL LOW (ref 3.5–5.0)
Alkaline Phosphatase: 218 U/L — ABNORMAL HIGH (ref 38–126)
Anion gap: 9 (ref 5–15)
BUN: 23 mg/dL — ABNORMAL HIGH (ref 6–20)
CO2: 23 mmol/L (ref 22–32)
Calcium: 8.6 mg/dL — ABNORMAL LOW (ref 8.9–10.3)
Chloride: 109 mmol/L (ref 101–111)
Creatinine, Ser: 0.88 mg/dL (ref 0.61–1.24)
GFR calc Af Amer: >60 mL/min (ref >60)
GFR calc non Af Amer: >60 mL/min (ref >60)
Glucose, Bld: 81 mg/dL (ref 65–99)
Potassium: 3.7 mmol/L (ref 3.5–5.1)
Sodium: 141 mmol/L (ref 135–145)
Total Bilirubin: 1.3 mg/dL — ABNORMAL HIGH (ref 0.3–1.2)
Total Protein: 6 g/dL — ABNORMAL LOW (ref 6.5–8.1)

## 2017-01-31 LAB — GLUCOSE, CAPILLARY
Glucose-Capillary: 102 mg/dL — ABNORMAL HIGH (ref 65–99)
Glucose-Capillary: 98 mg/dL (ref 65–99)

## 2017-01-31 LAB — I-STAT ARTERIAL BLOOD GAS, ED
Acid-base deficit: 1 mmol/L (ref 0.0–2.0)
Acid-base deficit: 1 mmol/L (ref 0.0–2.0)
Bicarbonate: 28.9 mmol/L — ABNORMAL HIGH (ref 20.0–28.0)
Bicarbonate: 26.7 mmol/L (ref 20.0–28.0)
O2 Saturation: 99 %
O2 Saturation: 97 %
Patient temperature: 94.5
Patient temperature: 94.5
TCO2: 31 mmol/L (ref 22–32)
TCO2: 28 mmol/L (ref 22–32)
pCO2 arterial: 61.9 mmHg — ABNORMAL HIGH (ref 32.0–48.0)
pCO2 arterial: 49.6 mmHg — ABNORMAL HIGH (ref 32.0–48.0)
pH, Arterial: 7.265 — ABNORMAL LOW (ref 7.350–7.450)
pH, Arterial: 7.327 — ABNORMAL LOW (ref 7.350–7.450)
pO2, Arterial: 134 mmHg — ABNORMAL HIGH (ref 83.0–108.0)
pO2, Arterial: 95 mmHg (ref 83.0–108.0)

## 2017-01-31 LAB — CBG MONITORING, ED
Glucose-Capillary: 84 mg/dL (ref 65–99)
Glucose-Capillary: 69 mg/dL (ref 65–99)
Glucose-Capillary: 86 mg/dL (ref 65–99)

## 2017-01-31 LAB — CBC
HCT: 43 % (ref 39.0–52.0)
Hemoglobin: 13.7 g/dL (ref 13.0–17.0)
MCH: 25.7 pg — ABNORMAL LOW (ref 26.0–34.0)
MCHC: 31.9 g/dL (ref 30.0–36.0)
MCV: 80.5 fL (ref 78.0–100.0)
Platelets: 253 10*3/uL (ref 150–400)
RBC: 5.34 MIL/uL (ref 4.22–5.81)
RDW: 17.6 % — ABNORMAL HIGH (ref 11.5–15.5)
WBC: 6.1 10*3/uL (ref 4.0–10.5)

## 2017-01-31 LAB — GAMMA GT: GGT: 164 U/L — ABNORMAL HIGH (ref 7–50)

## 2017-01-31 LAB — CORTISOL-AM, BLOOD: Cortisol - AM: 4.2 ug/dL — ABNORMAL LOW (ref 6.7–22.6)

## 2017-01-31 LAB — MRSA PCR SCREENING: MRSA by PCR: NEGATIVE

## 2017-01-31 MED ORDER — INSULIN ASPART 100 UNIT/ML ~~LOC~~ SOLN
0.0000 [IU] | Freq: Three times a day (TID) | SUBCUTANEOUS | Status: DC
  Administered 2017-02-01 – 2017-02-02 (×3): 2 [IU] via SUBCUTANEOUS
  Administered 2017-02-02: 3 [IU] via SUBCUTANEOUS
  Administered 2017-02-02 – 2017-02-03 (×2): 2 [IU] via SUBCUTANEOUS
  Administered 2017-02-03: 3 [IU] via SUBCUTANEOUS
  Administered 2017-02-03 – 2017-02-04 (×2): 5 [IU] via SUBCUTANEOUS
  Administered 2017-02-04: 3 [IU] via SUBCUTANEOUS
  Administered 2017-02-05: 2 [IU] via SUBCUTANEOUS
  Administered 2017-02-05: 3 [IU] via SUBCUTANEOUS
  Administered 2017-02-05: 2 [IU] via SUBCUTANEOUS
  Administered 2017-02-06 (×2): 3 [IU] via SUBCUTANEOUS
  Administered 2017-02-06: 2 [IU] via SUBCUTANEOUS
  Administered 2017-02-07: 3 [IU] via SUBCUTANEOUS
  Administered 2017-02-07: 5 [IU] via SUBCUTANEOUS

## 2017-01-31 MED ORDER — FLUMAZENIL 0.5 MG/5ML IV SOLN
0.2000 mg | Freq: Once | INTRAVENOUS | Status: AC
  Administered 2017-01-31: 0.2 mg via INTRAVENOUS
  Filled 2017-01-31: qty 5

## 2017-01-31 MED ORDER — CHLORHEXIDINE GLUCONATE 0.12 % MT SOLN
15.0000 mL | Freq: Two times a day (BID) | OROMUCOSAL | Status: DC
  Administered 2017-01-31 – 2017-02-07 (×15): 15 mL via OROMUCOSAL
  Filled 2017-01-31 (×12): qty 15

## 2017-01-31 MED ORDER — HYDROCORTISONE NA SUCCINATE PF 100 MG IJ SOLR
50.0000 mg | Freq: Four times a day (QID) | INTRAMUSCULAR | Status: DC
  Administered 2017-01-31 – 2017-02-02 (×8): 50 mg via INTRAVENOUS
  Filled 2017-01-31: qty 2
  Filled 2017-01-31: qty 1
  Filled 2017-01-31 (×3): qty 2
  Filled 2017-01-31 (×3): qty 1
  Filled 2017-01-31 (×3): qty 2

## 2017-01-31 MED ORDER — DEXTROSE 10 % IV SOLN
INTRAVENOUS | Status: DC
  Administered 2017-01-31: 14:00:00 via INTRAVENOUS

## 2017-01-31 MED ORDER — ORAL CARE MOUTH RINSE
15.0000 mL | Freq: Two times a day (BID) | OROMUCOSAL | Status: DC
  Administered 2017-02-01 – 2017-02-07 (×14): 15 mL via OROMUCOSAL

## 2017-01-31 NOTE — ED Notes (Signed)
Updated family at bedside 

## 2017-01-31 NOTE — ED Notes (Signed)
Paged admitting for pt, NPO, blood sugar 69

## 2017-01-31 NOTE — ED Notes (Signed)
Spoke with admitting MD, Nori Riis this morning concerning pt's temperature and continued altered mental status. Plan to change meds to IV.

## 2017-01-31 NOTE — ED Notes (Signed)
Report attempted 

## 2017-01-31 NOTE — ED Notes (Signed)
Patient placed on hospital bed for comfort °

## 2017-01-31 NOTE — Consult Note (Signed)
PULMONARY / CRITICAL CARE MEDICINE   Name: [name] MRN: [id number] DOB: 1955/12/06    ADMISSION DATE:  01/30/2017 CONSULTATION DATE: 11/29  REFERRING MD:  Dr. Nori Riis  CHIEF COMPLAINT: Decreased level of consciousness, concern for airway protection  HISTORY OF PRESENT ILLNESS:   This is a 61 year old male patient with multiple medical comorbidities.  He resides at a nursing home due to advanced diabetes, congestive heart failure, and prior seizure disorder.  He is also had prior right BKA as well as nonhealing vascular wounds on the left lower extremity.  He was admitted to the emergency room from the nursing home on 11/28 with nursing home reporting that he was "not acting right".  He apparently had been treated for the prior 2 weeks for possible cellulitis involving the right upper extremity this remains erythemic.  (With Keflex) he was found to be hypothermic on presentation, a CTA of head was obtained demonstrating multiple small chronic infarcts and chronic ischemic changes but nothing acute.  He was to be admitted to the stepdown unit. Of acute encephalopathy which was  presumed metabolic in nature in the setting of possible sepsis.  As he had slurred speech he was to undergo MRI for which she obtained was administered Lorazepam intravenously.  Unfortunately they were unable to obtain MRI, he was initially agitated when he came back from MRI, then subsequently became progressively somnolent with episodes of apnea witnessed by nursing staff as well as brief desaturation.  Pulmonary was asked to see him for decreased level of consciousness, and concern for ineffective airway protection. On critical care arrival he would awaken briefly, his speech was slurred and unintelligible.  The following interventions were completed: He was given 0.2 mg of Romazicon, and arterial blood gas was obtained, the therefore he was placed on noninvasive positive pressure ventilation.  Showed a pH of 7.23, a PCO2  of 68, a PO2 of 147 and a bicarbonate of 29.  Following application of noninvasive positive  pressure ventilation His mental status did improve some; he will be admitted to the intensive care for further evaluation and therapy.  Unable  PAST MEDICAL HISTORY :  He  has a past medical history of Acute on chronic systolic heart failure, NYHA class 3 ([place]), AKI (acute kidney injury) ([place]), Anemia, BKA stump complication ([place]), Blind left eye, Blind left eye, Cardiomyopathy, dilated ([place]), CHF (congestive heart failure) ([place]), Combined systolic and diastolic congestive heart failure ([place]), Congestive dilated cardiomyopathy ([place]) (07/11/2015), Diabetes mellitus, Diabetic neuropathy associated with type 2 diabetes mellitus ([place]), Hypertension, Neuropathy, Noncompliance with medications, Open knee wound (10/2015), PAD (peripheral artery disease) ([place]), Protein calorie malnutrition ([place]), and Shortness of breath dyspnea.  PAST SURGICAL HISTORY: He  has a past surgical history that includes I&D extremity (Right, 09/24/2013); I&D extremity (Right, 09/26/2013); Amputation (Right, 09/26/2013); Tonsillectomy; Colonoscopy; Multiple tooth extractions; Stump revision (Right, 04/20/2015); Amputation (Right, 05/01/2015); Cardiac catheterization (N/A, 07/13/2015); Skin split graft (Right, 2/0/3559); and Application if wound vac (08/10/2016).  No Known Allergies  No current facility-administered medications on file prior to encounter.    Current Outpatient Medications on File Prior to Encounter  Medication Sig  . Amino Acids-Protein Hydrolys (PRO-STAT) LIQD Take 30 mLs by mouth 2 (two) times daily.  Marland Kitchen aspirin 325 MG tablet Take 1 tablet (325 mg total) by mouth daily.  Marland Kitchen atorvastatin (LIPITOR) 40 MG tablet Take 1 tablet (40 mg total) by mouth daily. (Patient taking differently: Take 40 mg by mouth at bedtime. )  . bisoprolol (ZEBETA) 5  MG tablet Take 1 tablet (5 mg total) by mouth daily. (Patient taking differently: Take 5 mg by  mouth 2 (two) times daily. )  . brimonidine (ALPHAGAN) 0.2 % ophthalmic solution PLACE 1 DROP INTO THE LEFT EYE 2 (TWO) TIMES DAILY.  . cephALEXin (KEFLEX) 500 MG capsule Take 500 mg by mouth 2 (two) times daily. FOR 14 DAYS FOR RIGHT ARM SWELLING  . dorzolamide-timolol (COSOPT) 22.3-6.8 MG/ML ophthalmic solution Place 1 drop into the left eye 2 (two) times daily.  . furosemide (LASIX) 40 MG tablet Take 1 tablet (40 mg total) by mouth 2 (two) times daily.  Marland Kitchen levETIRAcetam (KEPPRA) 750 MG tablet Take 1 tablet (750 mg total) by mouth 2 (two) times daily.  Marland Kitchen LORazepam (ATIVAN) 0.5 MG tablet Take 0.5 mg by mouth every 6 (six) hours as needed for anxiety. FOR 14 DAYS  . metFORMIN (GLUCOPHAGE) 500 MG tablet Take 1 tablet (500 mg total) by mouth 2 (two) times daily with a meal.  . Multiple Vitamins-Minerals (DECUBI-VITE) CAPS Take 1 capsule by mouth daily.  . QUEtiapine (SEROQUEL) 25 MG tablet Take 25 mg by mouth every 12 (twelve) hours.  . sacubitril-valsartan (ENTRESTO) 49-51 MG Take 1 tablet by mouth 2 (two) times daily.  . tamsulosin (FLOMAX) 0.4 MG CAPS capsule Take 2 capsules (0.8 mg total) by mouth daily after supper.  . vitamin A 10000 UNIT capsule Take 10,000 Units by mouth daily.  . vitamin C (ASCORBIC ACID) 500 MG tablet Take 500 mg by mouth daily.  Marland Kitchen zinc sulfate 220 (50 Zn) MG capsule Take 220 mg by mouth daily.  . bacitracin ointment Apply topically 2 (two) times daily. (Patient not taking: Reported on 01/30/2017)    FAMILY HISTORY:  His indicated that his mother is deceased. He indicated that his father is deceased.   SOCIAL HISTORY: He  reports that he quit smoking about 16 years ago. He quit after 20.00 years of use. He quit smokeless tobacco use about 16 years ago. He reports that he does not drink alcohol or use drugs.  REVIEW OF SYSTEMS:   Unable  SUBJECTIVE:  A little more awake following administration of noninvasive positive pressure ventilation  VITAL SIGNS: BP (!)  127/94   Pulse 66   Temp (!) 94.5 F (34.7 C) (Rectal)   Resp (!) 21   Ht 5\' 10"  (1.778 m)   Wt 195 lb (88.5 kg)   SpO2 100%   BMI 27.98 kg/m    HEMODYNAMICS:    VENTILATOR SETTINGS: Vent Mode: BIPAP FiO2 (%):  [30 %] 30 % Set Rate:  [15 bmp] 15 bmp PEEP:  [5 cmH20] 5 cmH20  INTAKE / OUTPUT: I/O last 3 completed shifts: In: 1150 [IV Piggyback:1150] Out: -   PHYSICAL EXAMINATION: General:  Chronically ill-appearing 61 year old male more responsive since placing him on BiPAP  Neuro: awakens easily to voice are weak but he follows commands, Speech remains slurred but he is oriented x2.  Good movement of upper extremities  HEENT: He is blind in both eyes, mucous membranes are moist, neck is large no clear jugular venous distention Cardiovascular: Regular rate and rhythm without murmur rub or gallop Lungs: Some scattered rhonchi, no accessory muscle use, prior to placement of the noninvasive ventilator he did have witnessed episodes of apnea with snoring respiratory effort Abdomen:  abdomen is large protuberant,Soft, no organomegaly Musculoskeletal: He has had prior BKA involving the right lower extremity, and nonhealing ulcerations on the left lower extremity there is a dressing applied there.  Skin: Multiple  chronic n nonhealing leg ulcers on both lower extremities  LABS:  BMET Recent Labs  Lab 01/30/17 1310 01/30/17 2037 01/31/17 0438  NA 140  --  141  K 3.7  --  3.7  CL 108  --  109  CO2 27  --  23  BUN 24*  --  23*  CREATININE 0.93 0.89 0.88  GLUCOSE 89  --  81    Electrolytes Recent Labs  Lab 01/30/17 1310 01/31/17 0438  CALCIUM 8.4* 8.6*    CBC Recent Labs  Lab 01/30/17 1310 01/30/17 2037 01/31/17 0438  WBC 7.0 5.9 6.1  HGB 13.4 13.8 13.7  HCT 41.5 42.2 43.0  PLT 257 253 253    Coag's No results for input(s): APTT, INR in the last 168 hours.  Sepsis Markers Recent Labs  Lab 01/30/17 1326  LATICACIDVEN 0.84    ABG Recent Labs  Lab  01/31/17 0925  PHART 7.265*  PCO2ART 61.9*  PO2ART 134.0*    Liver Enzymes Recent Labs  Lab 01/30/17 1310 01/31/17 0438  AST 27 23  ALT 21 18  ALKPHOS 233* 218*  BILITOT 1.1 1.3*  ALBUMIN 2.3* 2.3*    Cardiac Enzymes No results for input(s): TROPONINI, PROBNP in the last 168 hours.  Glucose Recent Labs  Lab 01/31/17 0804  GLUCAP 84    Imaging Dg Chest 2 View  Result Date: 01/30/2017 CLINICAL DATA:  Altered level of consciousness. EXAM: CHEST  2 VIEW COMPARISON:  12/20/2016 FINDINGS: Artifact overlies chest. There is cardiomegaly. Mediastinal shadows are otherwise normal. The lungs appear clear. Cannot rule out some left lower lobe volume loss. Question small dependent effusions. Upper lungs appear clear. IMPRESSION: Question small dependent effusions and some left lower lobe volume loss. Cardiomegaly. Electronically Signed   By: Nelson Chimes M.D.   On: 01/30/2017 13:48   Ct Head Wo Contrast  Result Date: 01/30/2017 CLINICAL DATA:  61 y/o  M; altered mental status. EXAM: CT HEAD WITHOUT CONTRAST TECHNIQUE: Contiguous axial images were obtained from the base of the skull through the vertex without intravenous contrast. COMPARISON:  10/19/2016 MRI of the head. FINDINGS: Brain: No evidence of acute infarction, hemorrhage, hydrocephalus, extra-axial collection or mass lesion/mass effect. Small chronic infarcts in bilateral anterolateral frontal lobes, right posterolateral frontal lobe, left parietal lobe, and left cerebellar hemisphere. Stable chronic microvascular ischemic changes of white matter and parenchymal volume loss of the brain. Vascular: Calcific atherosclerosis of the carotid siphons. Skull: Normal. Negative for fracture or focal lesion. Sinuses/Orbits: No acute finding. Increased attenuation of right globe may represent vitreous hemorrhage or silicon tamponade. Other: None. IMPRESSION: 1. No acute intracranial abnormality identified. 2. Multiple small chronic infarcts  are stable. Stable chronic microvascular ischemic changes and parenchymal volume loss of the brain. 3. Increased density of right globe may represent vitreous hemorrhage of silicon tamponade, clinical correlation recommended. Electronically Signed   By: Kristine Garbe M.D.   On: 01/30/2017 13:38     STUDIES:  CT head:11/28.  Negative for acute intracranial abnormality.  Multiple small chronic infarcts.  Stable chronic microvascular ischemic changes with parenchymal volume loss.  CULTURES: Blood cultures x2 11/28  ANTIBIOTICS: Vancomycin 11/28 Zosyn 11/28  SIGNIFICANT EVENTS:   LINES/TUBES:   DISCUSSION: 61 year old male patient with multiple medical comorbidities he presented with altered sensorium however was initially awake and oriented.  He was to be admitted for evaluation of hypothermia as well as possible right upper extremity cellulitis.  An MRI was to be obtained to  further evaluate slurred speech and rule out acute stroke.  He did obtain Lorazepam for his MRI he apparently was initially agitated however became progressively more somnolent with episodes of apnea and desaturation as low as 60%.  Pulmonary was asked given concern for airway protection and progressive clinical decline.  He is found to have acute hypercarbic respiratory failure almost certainly exacerbated by benzodiazepines and the patient with what is likely undiagnosed sleep apnea.  This would explain his acute decline but does not explain his presenting complaints.  We have placed him on noninvasive positive pressure ventilation and he has had promising response to that now much more awake alert and cooperative.  He does appear to have an element of adrenal insufficiency for this will stress dose steroids.  We are awaiting his TSH and continuing symptomatic support in regards to his hypothermia.  He certainly could be septic from right upper extremity agree with antibiotics and culturing blood.  Additionally he  has a history of seizure disorder and wonder if he is having intermittent nonconvulsive seizure, will get an EEG  ASSESSMENT / PLAN:  Acute encephalopathy and slurred speech History of chronic ischemic changes on CT brain  history of seizures Blind -Suspect encephalopathy is primarily metabolic however acutely exacerbated from sedating medications given hypercarbia and then subsequent improvement  in mental status following application of positive pressure ventilation.  Even still this would not explain his mental status leading up to this.  Given history of possible seizure one explanation could be subclinical seizure plus minus postictal state Plan Hold all sedating medications admit to the intensive care Continue anticonvulsants Get EEG Consider neurological consult  Acute hypercarbic respiratory failure in the setting of sedating medications exacerbated by probable underlying sleep apnea 11/28.   Portable chest x-ray personally reviewed: This demonstrates low lung volumes with some volume loss on the left.  No clear edema P:   Continue noninvasive positive pressure ventilationhold all sedating medications Hold all sedating medications Repeat arterial blood  gas Admit to intensive care for close observation given intubation risk   History of systolic heart failure Ejection fraction noted to be 20-25% with diffuse hypo-Kinesis in July 2018 -No evidence of acute heart failure currently Possible sepsis, however does not meet SIRS criteria Hyperlipidemia  p:  KVO IV fluids Continue telemetry monitoring Agree continuing home Lasix Continuing bisoprolol and Entresto Continue atorvastatin  At risk for fluid and electrolyte imbalance P:   Trend chemistry Strict intake and output  Hypothermia, etiology unclear.  Could be sepsis.  Cortisol is very low.  So appears to have some at least some degree of adrenal insufficiency.  Next plan Stress dose steroids Follow-up TSH Bear  hugger  Abnormal LFTs P:   We will follow-up, could consider abdominal ultrasound although has no discomfort    Right upper extremity cellulitis P:   Follow-up blood cultures Day #2 Vanco and Zosyn  Diabetes, insulin-dependent  P:   Sliding scale insulin     FAMILY  - Updates:   - Inter-disciplinary family meet or Palliative Care meeting due by: December 6   My critical care time 45 minutes Erick Colace ACNP-BC [facility] Pager # [phone] OR # [phone] if no answer   01/31/2017, 9:58 AM

## 2017-01-31 NOTE — Progress Notes (Signed)
Bedside EEG completed, results pending. 

## 2017-01-31 NOTE — ED Notes (Addendum)
Pt continues to be altered this morning on assessment. Pt able to states his name. Otherwise, does not follow commands. Pt continues to be hypothermic- placed on bair hugger- as long as pt will tolerate. Also placed condom catheter.

## 2017-01-31 NOTE — ED Notes (Signed)
Bair hugger turned to low

## 2017-01-31 NOTE — Consult Note (Addendum)
[facility] Nurse wound consult note Reason for Consult: BLE wounds full thickness, partial thickness Wound type: venous insufficiency and suspected traumatic knee injuries. (pt unable to discuss) Pressure Injury POA: NA Measurement: Right leg just distal to knee 4cm x 4cm x 0.2cm full thickness 90% red, 10% yellow with round hole in center of wound that is draining tan exudate. Hard surface palpated in center of wound with swab, probable bone.  Left knee with 2cm x 2cm x 0.2cm full thickness 100% pink, scant tan exudate. Medial left calf 4cm x 2.5cm x 0.1cm 100% pink wound bed. Left Lateral calf 4cm x 3cm x 0.1cm 100% pink wound bed no drainage. Left 2nd toe 0.5cm round partial thickness wound pale pink bed, dry.  Distal to left knee 3cm x 3cm x 0.1cm full thickness with pale pink granulating tissue with loose white slough. Left pretibial area has a dozen scattered partial thickness ulcers all with 100% pink wound beds and dry, no drainage. Wound bed:see above Drainage (amount, consistency, odor) see above, no odor Periwound:intact Dressing procedure/placement/frequency:I have provided nurses with orders for continuing Aquacel Ag+ for absorption and antibacterial component to draining wounds on bilateral knees. Wounds sound much improved according to previous notes by other [facility] team members. Patient's level of consciousness is decreased, on Bipap. Will order Prevalon Boot to protect left heel. Per lab results pt could benefit from supplements or Nutritional Consult, Please order if you agree. We will not follow, but will remain available to this patient, to nursing, and the medical and/or surgical teams.  Please re-consult if we need to assist further.   Fara Olden, RN-C, WTA-C Wound Treatment Associate

## 2017-01-31 NOTE — Discharge Summary (Signed)
[facility] Discharge Summary  Patient name: [name] Medical record number: [id number] Date of birth: 1955/08/25 Age: 61 y.o. Gender: male Date of Admission: 01/30/2017  Date of Discharge: 02/07/17 Admitting Physician: Marshell Garfinkel, MD  Primary Care Provider: System, Pcp Not In Consultants: CCM  Indication for Hospitalization: AMS, R arm cellulitis, anasarca  Discharge Diagnoses/Problem List:  AMS, resolved RUE cellulitis, improved Anasarca Hypothermia HFrEF exacerbation  HTN, stable Hypoalbuminemia  HLD, stable Type 2 diabetes, stable Elevated alkaline phosphatase  Seizure disorder, stable Urinary retention, stable Constipation   Disposition: SNF  Discharge Condition: Improved  Discharge Exam:  Please see progress note from date of discharge   Brief Hospital Course:  [name] is a 61 y.o. male with PMH significant for HFrEF, HTN, HLD, T2DM, Seizure disorder, chronic non-healing wounds on LLE, right BKA, and cataract in left eye presenting with worsening slurred speech for the past 2 weeks with right arm swelling per SNF. On arrival was oriented but hypothermic with concern for infectious source of R arm cellulitis and was started on Vanc/Zosyn, as well as concern for diffuse anasarca. Blood cultures drawn in ED. Received Ativan in preparation for Brain MRI to further assess AMS but became significantly agitated. The patient subsequently became somnolent and had concern for airway protection and continued AMS, CCM consulted and recommended transfer to ICU. In ICU, placed on BiPAP with subsequent improvement in mental and respiratory status. ABG indicative of hypercarbic respiratory failure. EEG 11/29 consistent with encephalopathy. Hypothermia resolved with bear hugger. Patient weaned off BiPAP and became stable for transfer back to the floor on 12/1. Patient had good O2 sats on room air throughout the rest of his hospitalization but did  desat intermittently while sleeping and so was placed on CPAP at night with concern for possible OSA. Would likely benefit from outpatient sleep study, but patient refusing CPAP during admission. Because of patient's anasarca was diuresed with IV Lasix 80mg  bid before transitioning to torsemide 80mg  bid which he tolerated well with good urine output and decreased weight.  Issues for Follow Up:  1. Medication Changes: 1. Discharged with 1 day continued course steroid taper (10mg  on 12/7) 2. Torsemide 80mg  bid  2. Would likely benefit from outpatient sleep study. Patient refusing CPAP but would likely benefit from CPAP use at night.  3. Outpatient follow up with heart failure team  4. miralax at bid dosing, can increase frequency as needed until soft daily bowel movements  5. Consider palliative consult as outpatient as patient appears to nearing end stage heart failure  6. Heart healthy low salt diet  Significant Procedures: none   Significant Labs and Imaging:  Recent Labs  Lab 02/03/17 0358 02/04/17 0532 02/05/17 0407  WBC 7.8 7.2 7.5  HGB 13.4 14.0 14.5  HCT 41.6 43.8 43.4  PLT 244 248 227   Recent Labs  Lab 02/01/17 0532  02/03/17 0358 02/04/17 0532 02/05/17 0407 02/06/17 0525 02/07/17 0520  NA 140   < > 139 140 139 138 139  K 3.9   < > 3.8 3.7 3.8 3.9 3.4*  CL 109   < > 103 102 99* 97* 93*  CO2 25   < > 27 31 32 33* 39*  GLUCOSE 150*   < > 143* 123* 157* 132* 188*  BUN 21*   < > 29* 31* 32* 27* 25*  CREATININE 0.93   < > 1.12 1.12 0.92 0.86 0.96  CALCIUM 8.6*   < > 8.7*  8.8* 8.5* 8.5* 8.4*  ALKPHOS 179*  --  173* 187* 177*  --   --   AST 17  --  28 34 33  --   --   ALT 16*  --  25 33 39  --   --   ALBUMIN 2.0*  --  2.1* 2.2* 2.0*  --   --    < > = values in this interval not displayed.    11/29 EEG Abnormalities:  1) generalized irregular slow activity 2) slow PDR Clinical Interpretation: This EEG is consistent with a generalized non-specific cerebral  dysfunction(encephalopathy). There was no seizure or seizure predisposition recorded on this study.   Dg Chest 2 View  Result Date: 02/05/2017 CLINICAL DATA:  Shortness of Breath EXAM: CHEST  2 VIEW COMPARISON:  01/30/2017 FINDINGS: Cardiac shadow is mildly enlarged but stable. The lungs are well aerated bilaterally. Patchy atelectatic changes are noted in the bases bilaterally. Small bilateral pleural effusions are noted posteriorly. This may contribute to some of the density seen over the lung bases. No pneumothorax is seen. No bony abnormality is seen. IMPRESSION: Mild bibasilar atelectatic changes and small bilateral pleural effusions. Electronically Signed   By: Inez Catalina M.D.   On: 02/05/2017 14:15   Results/Tests Pending at Time of Discharge:  Unresulted Labs (From admission, onward)   Start     Ordered   02/06/17 1610  Basic metabolic panel  Daily,   R    Question:  Specimen collection method  Answer:  Lab=Lab collect   02/05/17 1130      Discharge Medications:  Allergies as of 02/07/2017   No Known Allergies     Medication List    STOP taking these medications   cephALEXin 500 MG capsule Commonly known as:  KEFLEX   furosemide 40 MG tablet Commonly known as:  LASIX   LORazepam 0.5 MG tablet Commonly known as:  ATIVAN     TAKE these medications   aspirin 325 MG tablet Take 1 tablet (325 mg total) by mouth daily.   atorvastatin 40 MG tablet Commonly known as:  LIPITOR Take 1 tablet (40 mg total) by mouth daily. What changed:  when to take this   bacitracin ointment Apply topically 2 (two) times daily.   bisoprolol 5 MG tablet Commonly known as:  ZEBETA Take 1 tablet (5 mg total) by mouth daily. What changed:  when to take this   brimonidine 0.2 % ophthalmic solution Commonly known as:  ALPHAGAN PLACE 1 DROP INTO THE LEFT EYE 2 (TWO) TIMES DAILY.   DECUBI-VITE Caps Take 1 capsule by mouth daily.   dorzolamide-timolol 22.3-6.8 MG/ML ophthalmic  solution Commonly known as:  COSOPT Place 1 drop into the left eye 2 (two) times daily.   levETIRAcetam 750 MG tablet Commonly known as:  KEPPRA Take 1 tablet (750 mg total) by mouth 2 (two) times daily.   metFORMIN 500 MG tablet Commonly known as:  GLUCOPHAGE Take 1 tablet (500 mg total) by mouth 2 (two) times daily with a meal.   polyethylene glycol packet Commonly known as:  MIRALAX / GLYCOLAX Take 17 g by mouth 2 (two) times daily.   predniSONE 10 MG tablet Commonly known as:  DELTASONE Take 1 tablet (10 mg total) by mouth daily with breakfast. On 02/08/17 Start taking on:  02/08/2017   PRO-STAT Liqd Take 30 mLs by mouth 2 (two) times daily.   QUEtiapine 25 MG tablet Commonly known as:  SEROQUEL Take 25 mg by mouth  every 12 (twelve) hours.   sacubitril-valsartan 49-51 MG Commonly known as:  ENTRESTO Take 1 tablet by mouth 2 (two) times daily.   tamsulosin 0.4 MG Caps capsule Commonly known as:  FLOMAX Take 2 capsules (0.8 mg total) by mouth daily after supper.   torsemide 20 MG tablet Commonly known as:  DEMADEX Take 4 tablets (80 mg total) by mouth 2 (two) times daily.   vitamin A 10000 UNIT capsule Take 10,000 Units by mouth daily.   vitamin C 500 MG tablet Commonly known as:  ASCORBIC ACID Take 500 mg by mouth daily.   zinc sulfate 220 (50 Zn) MG capsule Take 220 mg by mouth daily.       Discharge Instructions: Please refer to Patient Instructions section of EMR for full details.  Patient was counseled important signs and symptoms that should prompt return to medical care, changes in medications, dietary instructions, activity restrictions, and follow up appointments.   Follow-Up Appointments:   Trinna Post 02/07/2017, 2:12 PM PGY-1, [place]

## 2017-01-31 NOTE — Progress Notes (Signed)
Family Medicine Teaching Service Daily Progress Note Intern Pager: [phone]  Patient name: [name] Medical record number: [id number] Date of birth: [birth year] Age: 61 y.o. Gender: male  Primary Care Provider: System, Pcp Not In Consultants: CCM Code Status: Full  Pt Overview and Major Events to Date:  11/28 - admitted for AMS, slurred speech  Assessment and Plan: [name] is a 61 y.o. male presenting with worsening slurred speech for the past 2 weeks with right arm swelling . 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  AMS?/Slurred speech:  Patient at [facility], has history of "not acting right" per nursing home. Patient endorsed two weeks of worsening "slurred speech" on admission. CT Head was done and showed multiple small chronic infarcts, chronic ischemic changes, and parenchymal volume loss of the brain. MRI brain was planned, patient was given Ativan for anxiety and noted to become increasingly agitated with inability to complete MRI. Patient somnolent on exam this am 11/29 and agitation likely medication induced. Potential etiologies for presenting AMS include acute ischemic stroke but he continues to have no weakness in extremities or facial droop. Sepsis is another possibility as patient did have cellulitis that was treated as possible source of infection and hypothermia on admission. However has normal WBC, not tachycardic, and no tachypnea. Additionally, another possible etiology to consider is seizure. Patient has diagnosis of possible seizure DO that was diagnosed around 10/2016. At that time patient was hospitalized for seizure like activity (left arm jerking), slurred speech and left sided weakness. It was recommended that he start Keppra and full dose ASA and follow up with neurology as outpatient for possible loop recorder. AM cortisol 4.2., alk phos 218. Alb 2.3. CCM consulted  due to patient's continued AMS and reported apneic episodes early this am 11/29. - Obtain MRI of the brain to rule out stroke - Follow BCx - Continue Vanc/Zosyn (11/28 - ), narrow as cultures result  - keppra level  - CCM consult, appreciate recs  Right Upper Extremity Swelling and Anasarca:  Patient has finished course of antibiotics for cellulitis of his right upper extremity. It was not warm but he is hypothermic. His arm does appear erythematous. CXR showed cardiomegaly with small dependent effusions and some left lower lobe volume loss. Likely due to a combination of overall edematous state and cellulitis. He had trace to pitting edema on his left arm and in both thighs bilaterally as well as sacral edema on admission. Cannot rule out osteomyelitis but less likely. Less likely b/c I would expect a WBC and/or fever. Swelling could be due to worsening kidney function but this is less likely as his Cr is normal. Possible that he is becoming hypothyroid and may be presenting with the initial stages of myxedema coma, although TSH nl. Additionally no history of liver cirrhosis; patient reports of significant alcohol use in the past but has been abstinent for 20 years. CT abdomen in 2013 with normal liver. Also, he does have protein calorie malnutrition with low albumin to 2.3 (chronically low). Another possibility is worsening right heart failure causing anasarca. His last ECHO in 09/2016 showed EF 20-25%, diffuse LV hypokinesis, and elevated pulmonary artery pressure to 82mHg. However this by itself does not explain why his right upper extremity is more swollen than left.  BNP is elevated to 3205 but down from last admission (3536). Can consider IV Lasix to help with edema  but with ruling out infection and current etiology unclear, want to be cautious. Also currently not in respiratory distress with no lung findings. Not anemic, Hb 13.7. AM cortisol slightly low at 4.2.  - continue Vanc and Zosyn (11/28 -  ) - consider repeat ECHO  CHF: Patient does not appear to be in acute exacerbation, however he did present with atypical symptoms at last admission in acute exacerbation b/c he quit taking his meds. Has significant edema in both upper extremities and his right leg. His overall weight is at 195lbs which appears to be his baseline. He has unlabored breathing, normal respiratory rate, and normal heart rate, although somnolent. CXR showed cardiomegaly with questionable small dependent effusions and some left lower lobe volume loss. Continue to monitor. - Continue home meds Bisoprolol, Entresto - Strict I's/O's - Daily weights  HTN: BP has been poorly well controlled since admission with sys 956-213; diastolic 08-657. - Continue home meds Bisoprolol '5mg'$  BID and Entresto BID - Consider increasing Bisoprolol if diastolic continues to be elevated  HLD: Patient has history of hyperlipidemia well controlled on statin. - Continue home Atorvastatin '40mg'$  daily  T2DM: BG serum well controlled on admission at 89. Patient takes metformin '500mg'$  BID. A1c is 8.0. - CBGs at bedtime and before meals - sSSI and monitor CBGs.   Elevated Alkaline Phosphatase: chronic. No prior GGT on file, 164 this admission.  Seizure Disorder: Patient continues to have no symptoms of seizure however is somnolent on exam this morning. CCM consulted. - Continue Levetiracetam '750mg'$  BID  Urinary Retention: Patient is taking flomax at nursing home to help with urinary retention. - Continue Flomax  FEN/GI: heart healthy, carb modified Prophylaxis: Lovenox  Disposition: continue inpatient admission for AMS  Subjective:  Patient somnolent on exam today.   Objective: Temp:  [94.5 F (34.7 C)-95.7 F (35.4 C)] 94.5 F (34.7 C) (11/29 0809) Pulse Rate:  [59-80] 63 (11/29 1100) Resp:  [8-21] 12 (11/29 1100) BP: (110-177)/(53-115) 130/90 (11/29 1100) SpO2:  [92 %-100 %] 99 % (11/29 1100) FiO2 (%):  [30 %] 30 % (11/29  1015) Weight:  [195 lb (88.5 kg)] 195 lb (88.5 kg) (11/28 1247) Physical Exam: General: obese male lying on bed, somnolent. Cardiovascular: RRR, no murmurs/rubs/gallops Respiratory: CTA bilaterally anteriorly, no wheezes/crackles Abdomen: soft, +BS, non distended Extremities: 1+ pitting edema LE. R arm with erythema, no warmth. No extension beyond margins drawn on admission.  Laboratory: Recent Labs  Lab 01/30/17 1310 01/30/17 2037 01/31/17 0438  WBC 7.0 5.9 6.1  HGB 13.4 13.8 13.7  HCT 41.5 42.2 43.0  PLT 257 253 253   Recent Labs  Lab 01/30/17 1310 01/30/17 2037 01/31/17 0438  NA 140  --  141  K 3.7  --  3.7  CL 108  --  109  CO2 27  --  23  BUN 24*  --  23*  CREATININE 0.93 0.89 0.88  CALCIUM 8.4*  --  8.6*  PROT 5.9*  --  6.0*  BILITOT 1.1  --  1.3*  ALKPHOS 233*  --  218*  ALT 21  --  18  AST 27  --  23  GLUCOSE 89  --  81    Imaging/Diagnostic Tests: Dg Chest 2 View  Result Date: 01/30/2017 CLINICAL DATA:  Altered level of consciousness. EXAM: CHEST  2 VIEW COMPARISON:  12/20/2016 FINDINGS: Artifact overlies chest. There is cardiomegaly. Mediastinal shadows are otherwise normal. The lungs appear clear. Cannot rule out some left lower lobe volume loss.  Question small dependent effusions. Upper lungs appear clear. IMPRESSION: Question small dependent effusions and some left lower lobe volume loss. Cardiomegaly. Electronically Signed   By: Nelson Chimes M.D.   On: 01/30/2017 13:48   Ct Head Wo Contrast  Result Date: 01/30/2017 CLINICAL DATA:  61 y/o  M; altered mental status. EXAM: CT HEAD WITHOUT CONTRAST TECHNIQUE: Contiguous axial images were obtained from the base of the skull through the vertex without intravenous contrast. COMPARISON:  10/19/2016 MRI of the head. FINDINGS: Brain: No evidence of acute infarction, hemorrhage, hydrocephalus, extra-axial collection or mass lesion/mass effect. Small chronic infarcts in bilateral anterolateral frontal lobes, right  posterolateral frontal lobe, left parietal lobe, and left cerebellar hemisphere. Stable chronic microvascular ischemic changes of white matter and parenchymal volume loss of the brain. Vascular: Calcific atherosclerosis of the carotid siphons. Skull: Normal. Negative for fracture or focal lesion. Sinuses/Orbits: No acute finding. Increased attenuation of right globe may represent vitreous hemorrhage or silicon tamponade. Other: None. IMPRESSION: 1. No acute intracranial abnormality identified. 2. Multiple small chronic infarcts are stable. Stable chronic microvascular ischemic changes and parenchymal volume loss of the brain. 3. Increased density of right globe may represent vitreous hemorrhage of silicon tamponade, clinical correlation recommended. Electronically Signed   By: Kristine Garbe M.D.   On: 01/30/2017 13:38   Rory Percy, DO 01/31/2017, 11:27 AM PGY-1, [place] Intern pager: [phone], text pages welcome

## 2017-01-31 NOTE — ED Notes (Signed)
Admitting Resident at bedside to check status of patient;Pt is resting and BP has improved

## 2017-02-01 ENCOUNTER — Other Ambulatory Visit: Payer: Self-pay

## 2017-02-01 ENCOUNTER — Inpatient Hospital Stay (HOSPITAL_COMMUNITY): Payer: Medicaid Other

## 2017-02-01 LAB — GLUCOSE, CAPILLARY
Glucose-Capillary: 127 mg/dL — ABNORMAL HIGH (ref 65–99)
Glucose-Capillary: 146 mg/dL — ABNORMAL HIGH (ref 65–99)
Glucose-Capillary: 143 mg/dL — ABNORMAL HIGH (ref 65–99)
Glucose-Capillary: 112 mg/dL — ABNORMAL HIGH (ref 65–99)
Glucose-Capillary: 196 mg/dL — ABNORMAL HIGH (ref 65–99)
Glucose-Capillary: 172 mg/dL — ABNORMAL HIGH (ref 65–99)

## 2017-02-01 LAB — COMPREHENSIVE METABOLIC PANEL
ALT: 16 U/L — ABNORMAL LOW (ref 17–63)
AST: 17 U/L (ref 15–41)
Albumin: 2 g/dL — ABNORMAL LOW (ref 3.5–5.0)
Alkaline Phosphatase: 179 U/L — ABNORMAL HIGH (ref 38–126)
Anion gap: 6 (ref 5–15)
BUN: 21 mg/dL — ABNORMAL HIGH (ref 6–20)
CO2: 25 mmol/L (ref 22–32)
Calcium: 8.6 mg/dL — ABNORMAL LOW (ref 8.9–10.3)
Chloride: 109 mmol/L (ref 101–111)
Creatinine, Ser: 0.93 mg/dL (ref 0.61–1.24)
GFR calc Af Amer: >60 mL/min (ref >60)
GFR calc non Af Amer: >60 mL/min (ref >60)
Glucose, Bld: 150 mg/dL — ABNORMAL HIGH (ref 65–99)
Potassium: 3.9 mmol/L (ref 3.5–5.1)
Sodium: 140 mmol/L (ref 135–145)
Total Bilirubin: 1.2 mg/dL (ref 0.3–1.2)
Total Protein: 5.4 g/dL — ABNORMAL LOW (ref 6.5–8.1)

## 2017-02-01 LAB — CBC
HCT: 42.7 % (ref 39.0–52.0)
Hemoglobin: 13.8 g/dL (ref 13.0–17.0)
MCH: 26.1 pg (ref 26.0–34.0)
MCHC: 32.3 g/dL (ref 30.0–36.0)
MCV: 80.9 fL (ref 78.0–100.0)
Platelets: 249 10*3/uL (ref 150–400)
RBC: 5.28 MIL/uL (ref 4.22–5.81)
RDW: 17.6 % — ABNORMAL HIGH (ref 11.5–15.5)
WBC: 6.1 10*3/uL (ref 4.0–10.5)

## 2017-02-01 MED ORDER — SODIUM CHLORIDE 0.9 % IV SOLN
INTRAVENOUS | Status: DC
  Administered 2017-02-01: 10:00:00 via INTRAVENOUS

## 2017-02-01 MED ORDER — FUROSEMIDE 10 MG/ML IJ SOLN
20.0000 mg | Freq: Two times a day (BID) | INTRAMUSCULAR | Status: DC
  Administered 2017-02-01 – 2017-02-02 (×3): 20 mg via INTRAVENOUS
  Filled 2017-02-01 (×4): qty 2

## 2017-02-01 NOTE — Progress Notes (Signed)
eLink Physician-Brief Progress Note Patient Name: [name] DOB: [birth year] MRN: [id number]   Date of Service  02/01/2017  HPI/Events of Note   61 year old male being treated for hypothermia and questionable sepsis as well as systolic congestive heart failure. Nurse concerned that patient remains hypertensive. Notably has primarily a diastolic hypertension. Appears to have been started on Lasix IV every 12 hours. Notably patient is on Zosyn as well as stress dose hydrocortisone. These are likely contributing to the patient's diastolic hypertension. Systolic blood pressure only 130-164 per documented range. Note reports patient is planned for tapering of stress dose hydrocortisone tomorrow.   eICU Interventions  1. Continuing current plan of care 2. Continuing Lasix IV every 12 hours 3. Continuing treatment with bisoprolol and Entresto 4. Monitoring vitals per unit protocol     Intervention Category Major Interventions: Hypertension - evaluation and management;Other:  Tera Partridge 02/01/2017, 7:45 PM

## 2017-02-01 NOTE — Procedures (Signed)
History:  61 year old male being evaluated for encephalopathy   Sedation: none  Technique: This is a 21 channel routine scalp EEG performed at the bedside with bipolar and monopolar montages arranged in accordance to the international 10/20 system of electrode placement. One channel was dedicated to EKG recording.    Background: The background consists of irregular delta with some theta activity.  Occasional sleep spindles are seen.  With stimulation, there is a posterior dominant rhythm of 6-7 hz that is poorly formed and poorly sustained.  Photic stimulation: Physiologic driving is not performed  EEG Abnormalities: 1) generalized irregular slow activity 2) slow PDR  Clinical Interpretation: This EEG is consistent with a generalized non-specific cerebral dysfunction(encephalopathy). There was no seizure or seizure predisposition recorded on this study.   Roland Rack, MD [facility] [phone]  If 7pm- 7am, please page neurology on call as listed in [place].

## 2017-02-01 NOTE — Progress Notes (Signed)
FPTS Interim Progress Note  S: Patient much more alert today with no complaints. BiPAP in place.   O: BP (!) 164/113 (BP Location: Left Arm)   Pulse 63   Temp (!) 97.1 F (36.2 C) (Axillary)   Resp 14   Ht 5\' 10"  (1.778 m)   Wt 236 lb 5.3 oz (107.2 kg)   SpO2 99%   BMI 33.91 kg/m   Gen: lying in bed, BiPAP in place CV: RRR, no murmurs Resp: BiPAP in place with no increased WOB Neuro: alert and oriented x3.  A/P: Appreciate the excellent care of CCM. Will resume care once patient is stable to transfer back to the floor.  Rory Percy, DO 02/01/2017, 8:37 AM PGY-1, [place] Medicine Service pager [phone]

## 2017-02-01 NOTE — Progress Notes (Signed)
PULMONARY / CRITICAL CARE MEDICINE   Name: [name] MRN: [id number] DOB: 1956/01/30    ADMISSION DATE:  01/30/2017 CONSULTATION DATE: 11/29  REFERRING MD:  Dr. Nori Riis  CHIEF COMPLAINT: Decreased level of consciousness, concern for airway protection  HISTORY OF PRESENT ILLNESS:   This is a 61 year old male patient with multiple medical comorbidities.  He resides at a nursing home due to advanced diabetes, congestive heart failure, and prior seizure disorder.  He is also had prior right BKA as well as nonhealing vascular wounds on the left lower extremity.  He was admitted to the emergency room from the nursing home on 11/28 with nursing home reporting that he was "not acting right".  He apparently had been treated for the prior 2 weeks for possible cellulitis involving the right upper extremity this remains erythemic.  (With Keflex) he was found to be hypothermic on presentation, a CTA of head was obtained demonstrating multiple small chronic infarcts and chronic ischemic changes but nothing acute.  He was to be admitted to the stepdown unit. Of acute encephalopathy which was  presumed metabolic in nature in the setting of possible sepsis.  As he had slurred speech he was to undergo MRI for which she obtained was administered Lorazepam intravenously.  Unfortunately they were unable to obtain MRI, he was initially agitated when he came back from MRI, then subsequently became progressively somnolent with episodes of apnea witnessed by nursing staff as well as brief desaturation.  Pulmonary was asked to see him for decreased level of consciousness, and concern for ineffective airway protection. On critical care arrival he would awaken briefly, his speech was slurred and unintelligible.  The following interventions were completed: He was given 0.2 mg of Romazicon, and arterial blood gas was obtained, the therefore he was placed on noninvasive positive pressure ventilation.  Showed a pH of 7.23, a PCO2  of 68, a PO2 of 147 and a bicarbonate of 29.  Following application of noninvasive positive  pressure ventilation His mental status did improve some; he will be admitted to the intensive care for further evaluation and therapy.  Unable  SUBJECTIVE:  Much more awake, VITAL SIGNS: BP (!) 147/111   Pulse 64   Temp (!) 97.1 F (36.2 C) (Axillary)   Resp (!) 9   Ht 5\' 10"  (1.778 m)   Wt 236 lb 5.3 oz (107.2 kg)   SpO2 100%   BMI 33.91 kg/m   HEMODYNAMICS:    VENTILATOR SETTINGS: Vent Mode: Stand-by FiO2 (%):  [30 %] 30 % Set Rate:  [15 bmp] 15 bmp PEEP:  [5 cmH20] 5 cmH20 Plateau Pressure:  [6 cmH20-9 cmH20] 6 cmH20  INTAKE / OUTPUT: I/O last 3 completed shifts: In: 2165 [I.V.:550; IV Piggyback:1615] Out: 38 [Urine:565]  PHYSICAL EXAMINATION: General: Chronically ill-appearing 61 year old male now awake, oriented, does have some slurred speech, but this is close to baseline. Neuro: Awake, alert, oriented, moves all extremities, baseline slurred speech. HEENT: Blind both eyes, mucous membranes moist, neck large, no jugular venous distention. Cardiac: Regular rate and rhythm without murmur rub or gallop. Pulmonary: Clear to auscultation without accessory muscle use, some decreased breath sounds in the bases. Abdomen: Soft nontender no organomegaly. Musculoskeletal/extremities.  Has had prior BKA on the right lower extremity.  Has multiple nonhealing ulcers on the left lower extremity there are dressings intact.  General:  Chronically ill-appearing 61 year old male more responsive since placing him on BiPAP   LABS:  BMET Recent Labs  Lab 01/30/17 1310 01/30/17 2037  01/31/17 0438 02/01/17 0532  NA 140  --  141 140  K 3.7  --  3.7 3.9  CL 108  --  109 109  CO2 27  --  23 25  BUN 24*  --  23* 21*  CREATININE 0.93 0.89 0.88 0.93  GLUCOSE 89  --  81 150*    Electrolytes Recent Labs  Lab 01/30/17 1310 01/31/17 0438 02/01/17 0532  CALCIUM 8.4* 8.6* 8.6*     CBC Recent Labs  Lab 01/30/17 2037 01/31/17 0438 02/01/17 0532  WBC 5.9 6.1 6.1  HGB 13.8 13.7 13.8  HCT 42.2 43.0 42.7  PLT 253 253 249    Coag's No results for input(s): APTT, INR in the last 168 hours.  Sepsis Markers Recent Labs  Lab 01/30/17 1326  LATICACIDVEN 0.84    ABG Recent Labs  Lab 01/31/17 0925 01/31/17 1057  PHART 7.265* 7.327*  PCO2ART 61.9* 49.6*  PO2ART 134.0* 95.0    Liver Enzymes Recent Labs  Lab 01/30/17 1310 01/31/17 0438 02/01/17 0532  AST 27 23 17   ALT 21 18 16*  ALKPHOS 233* 218* 179*  BILITOT 1.1 1.3* 1.2  ALBUMIN 2.3* 2.3* 2.0*    Cardiac Enzymes No results for input(s): TROPONINI, PROBNP in the last 168 hours.  Glucose Recent Labs  Lab 01/31/17 1404 01/31/17 1654 01/31/17 2045 01/31/17 2335 02/01/17 0354 02/01/17 0757  GLUCAP 69 86 98 102* 127* 146*    Imaging Mr Brain Wo Contrast  Result Date: 02/01/2017 CLINICAL DATA:  Worsened slurred speech over the last 2 weeks. Right arm cellulitis. EXAM: MRI HEAD WITHOUT CONTRAST TECHNIQUE: Multiplanar, multiecho pulse sequences of the brain and surrounding structures were obtained without intravenous contrast. COMPARISON:  CT 01/30/2017.  MRI 10/19/2016. FINDINGS: Brain: Diffusion imaging does not show any acute or subacute infarction. The brainstem shows T2 signal throughout the pons which appears the same as was seen in August of this year. Findings probably represent chronic small-vessel change, but there is no volume loss or focal lacunar injury. Few old small vessel cerebellar infarctions. 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 These appear the same as were seen previously. No evidence of recent infarction, mass lesion, acute hemorrhage, hydrocephalus or extra-axial  collection. Vascular: Major vessels at the base of the brain show flow. Skull and upper cervical spine: Negative Sinuses/Orbits: Sinuses are clear. There are bilateral mastoid effusions. Markedly abnormal appearance of the right globe, possibly secondary to ocular hemorrhage or treatment for detached retina. This was not present on the previous MRI. Other: None IMPRESSION: No acute intracranial finding. Chronic small-vessel ischemic changes of the pons and cerebellum. 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 No new finding. No abnormal appearance to the right globe consistent with hemorrhage or treatment for detached retina. Electronically Signed   By: Nelson Chimes M.D.   On: 02/01/2017 07:05     STUDIES:  CT head:11/28.  Negative for acute intracranial abnormality.  Multiple small chronic infarcts.  Stable chronic microvascular ischemic changes with parenchymal volume loss. MRI 11/30: No acute findings  CULTURES: Blood cultures x2 11/28  ANTIBIOTICS: Vancomycin 11/28 Zosyn 11/28  SIGNIFICANT EVENTS:   LINES/TUBES:   DISCUSSION: 61 year old male patient with multiple medical comorbidities he presented with altered sensorium however was initially awake and  oriented.  He was to be admitted for evaluation of hypothermia as well as possible right upper extremity cellulitis.  An MRI was to be obtained to further evaluate slurred speech and rule out acute stroke.  He did obtain Lorazepam for his MRI he apparently was initially agitated however became progressively more somnolent with episodes of apnea and desaturation as low as 60%.  Pulmonary was asked given concern for airway protection and progressive clinical decline.  He is found to have acute hypercarbic respiratory failure almost certainly exacerbated by benzodiazepines and the patient with what is likely undiagnosed  sleep apnea.  This would explain his acute decline but does not explain his presenting complaints.  As of 11/30 he is awake, oriented, follows commands, and likely closer to his baseline status.  Suspect this was primarily cellulitis, acutely exacerbated by sedating medications as the reason for him landing in the intensive care.  At this point we will advance his diet, continue antibiotics, continue stress dose steroids, and use Bair hugger as indicated.  He is ready for transfer out of the intensive care will asked the internal medicine service to pick up his care.   ASSESSMENT / PLAN:  Acute encephalopathy and slurred speech History of chronic ischemic changes on CT brain  history of seizures Blind -Suspect encephalopathy is primarily metabolic however acutely exacerbated from sedating medications given hypercarbia and then subsequent improvement  in mental status following application of positive pressure ventilation.   -EEG was negative for seizure  -MRI was negative for acute intracranial findings. plan Discontinue any sedating medications Continuing home anticonvulsants  Acute hypercarbic respiratory failure in the setting of sedating medications exacerbated by probable underlying sleep apnea 11/28.   Portable chest x-ray personally reviewed: This demonstrates low lung volumes with some volume loss on the left.  No clear edema. -ABG and physical exam improved dramatically with noninvasive ventilation.  This morning is completely awake alert and oriented P:   Wean oxygen Mobilize Discontinue CPAP Hold sedating medications   History of systolic heart failure Ejection fraction noted to be 20-25% with diffuse hypo-Kinesis in July 2018 -No evidence of acute heart failure currently Possible sepsis, however does not meet SIRS criteria Hyperlipidemia  p:  KVO IV fluids Telemetry monitoring Continuing home medications except diuretic   Hypothermia, etiology unclear.  Could be sepsis.   Cortisol is very low.  So appears to have some at least some degree of adrenal insufficiency.   -TSH normal plan Continue stress dose steroids Bear hugger as indicated   Abnormal LFTs; improved P:   Follow-up in a.m.   Right upper extremity cellulitis P:   Follow-up cultures Day #3 vancomycin and Zosyn  Diabetes, insulin-dependent  Had episode of hypoglycemia last evening and was placed on D10 infusion -Glucose stable Plan Start diet Sliding scale insulin sensitive scale DC D10  FAMILY  - Updates:   - Inter-disciplinary family meet or Palliative Care meeting due by: December 6  Erick Colace ACNP-BC [place] Pager # [phone] OR # [phone] if no answer   02/01/2017, 9:50 AM

## 2017-02-01 NOTE — Progress Notes (Signed)
Pharmacy Antibiotic Note  [name] is a 61 y.o. male admitted on 01/30/2017 with cellulitis.  Pharmacy has been consulted for Vancomycin/Zosyn dosing.  Assessment: Patient has right upper extremity cellulitis, as well as chronic leg wounds on both legs. Patient's WBC remains WNL and stable. Patient was hypothermic overnight, was placed on a bear hugger, now WNL. SCr remains WNL and stable and is making some urine (0.40mL/kg/hr).    Plan: Continue Zosyn 3.375g IV Q8H  Continue Vancomycin 1g IV Q12H Monitor Temp, WBC, SCr, UOP, Cellulitis resolution  Height: 5\' 10"  (177.8 cm) Weight: 236 lb 5.3 oz (107.2 kg) IBW/kg (Calculated) : 73   Temp (24hrs), Avg:97.3 F (36.3 C), Min:96.7 F (35.9 C), Max:97.9 F (36.6 C)  Recent Labs  Lab 01/30/17 1310 01/30/17 1326 01/30/17 2037 01/31/17 0438 02/01/17 0532  WBC 7.0  --  5.9 6.1 6.1  CREATININE 0.93  --  0.89 0.88 0.93  LATICACIDVEN  --  0.84  --   --   --     Estimated Creatinine Clearance: 103.6 mL/min (by C-G formula based on SCr of 0.93 mg/dL).    No Known Allergies  Antimicrobials this admission: 11/28 Vanc >>  11/28 Zosyn >>   Dose adjustments this admission: N/A  Microbiology results: 11/29 BCx: NGTD 1 day 11/29 MRSA PCR: Negative  Thank you for allowing pharmacy to be a part of this patient's care.  Geraldo Pitter  PharmD Candidate 02/01/2017 12:17 PM   I discussed / reviewed the pharmacy note by Ms. Anderson and I agree with the student's findings and plans as documented.  Sloan Leiter, PharmD, BCPS, BCCCP Clinical Pharmacist Clinical phone 02/01/2017 until 3:30PM [phone] After hours, please call [phone] 02/01/2017, 12:34 PM

## 2017-02-01 NOTE — Evaluation (Signed)
Clinical/Bedside Swallow Evaluation Patient Details  Name: [name] MRN: [id number] Date of Birth: 10-Feb-1956  Today's Date: 02/01/2017 Time: SLP Start Time (ACUTE ONLY): 6213 SLP Stop Time (ACUTE ONLY): 1050 SLP Time Calculation (min) (ACUTE ONLY): 15 min  Past Medical History:  Past Medical History:  Diagnosis Date  . Acute on chronic systolic heart failure, NYHA class 3 ([place])   . AKI (acute kidney injury) ([place])   . Anemia   . BKA stump complication ([place])   . Blind left eye   . Blind left eye   . Cardiomyopathy, dilated ([place])   . CHF (congestive heart failure) ([place])   . Combined systolic and diastolic congestive heart failure ([place])   . Congestive dilated cardiomyopathy ([place]) 07/11/2015  . Diabetes mellitus   . Diabetic neuropathy associated with type 2 diabetes mellitus ([place])   . Hypertension   . Neuropathy   . Noncompliance with medications   . Open knee wound 10/2015   on rt bka  . PAD (peripheral artery disease) ([place])   . Protein calorie malnutrition ([place])   . Shortness of breath dyspnea    Past Surgical History:  Past Surgical History:  Procedure Laterality Date  . AMPUTATION Right 09/26/2013   Procedure: AMPUTATION BELOW KNEE;  Surgeon: Rozanna Box, MD;  Location: [facility];  Service: Orthopedics;  Laterality: Right;  . AMPUTATION Right 05/01/2015   Procedure: REVISION OF RIGHT TRANSTIBIAL AMPUTATION ;  Surgeon: Newt Minion, MD;  Location: [facility];  Service: Orthopedics;  Laterality: Right;  . APPLICATION OF WOUND VAC  08/10/2016   Procedure: APPLICATION OFI NCISONAL  WOUND VAC;  Surgeon: Newt Minion, MD;  Location: [facility];  Service: Orthopedics;;  . CARDIAC CATHETERIZATION N/A 07/13/2015   Procedure: Right/Left Heart Cath and Coronary Angiography;  Surgeon: Jolaine Artist, MD;  Location: [facility];  Service: Cardiovascular;  Laterality: N/A;  . COLONOSCOPY    . I&D EXTREMITY Right 09/24/2013   Procedure: IRRIGATION AND DEBRIDEMENT RIGHT FOOT ULCER;   Surgeon: Renette Butters, MD;  Location: [facility];  Service: Orthopedics;  Laterality: Right;  . I&D EXTREMITY Right 09/26/2013   Procedure: Repeat IRRIGATION AND DEBRIDEMENT Right Foot Ulcer;  Surgeon: Rozanna Box, MD;  Location: [facility];  Service: Orthopedics;  Laterality: Right;  . MULTIPLE TOOTH EXTRACTIONS    . SKIN SPLIT GRAFT Right 08/10/2016   Procedure: SKIN GRAFT SPLIT THICKNESS RIGHT LEG;  Surgeon: Newt Minion, MD;  Location: [facility];  Service: Orthopedics;  Laterality: Right;  . STUMP REVISION Right 04/20/2015   Procedure: Revision Right Below Knee Amputation, Apply Wound VAC;  Surgeon: Newt Minion, MD;  Location: [facility];  Service: Orthopedics;  Laterality: Right;  . TONSILLECTOMY     HPI:  Jake Dollins [name] a 61 y.o.malepresenting with worsening slurred speech for the past 2 weeks with right arm swelling. 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 CT showed multiple small chronic infarcts, chronic ischemic changes, and parenchymal volume loss of the brain, no acute abnormalities. MRI attempted and unable to complete. Critical care consulted and pt suspected to have cellulitis and acute hypercarbic resp failure exacerbated by benzos and underlying sleep apnea. ST consulted for hopeful po initiation. CXR 11/28 question small dependent effusions and some left lower lobe volume loss. Cardiomegaly.   Assessment / Plan / Recommendation Clinical Impression  Pt without history of dysphagia found in chart  or per pt report, no intubation this admission, CXR without s/s pna and significantly less confused. Moderate difficulty coordinating hand to mouth, lip to cup/straw coordination (also blind in left eye). Despite lack of upper dentition and missing posterior lower teeth, oral manipulation and tranist was functional. Pt's swallow and respiratory coordination normal (will have nocturnal Bipap for sleep  apnea). No s/s aspiration across consistencies including consecutive straw sips thin with water test. Recommend thin liquids (cup or straw), pills with liquid and Dys 2 diet (pt states he eats finger foods at SNF) and upgrade solids when appropriate with continued ST.      SLP Visit Diagnosis: Dysphagia, oral phase (R13.11)    Aspiration Risk  Mild aspiration risk    Diet Recommendation Dysphagia 2 (Fine chop);Thin liquid   Liquid Administration via: Straw;Cup Medication Administration: Whole meds with liquid Supervision: Staff to assist with self feeding;Full supervision/cueing for compensatory strategies Compensations: Slow rate;Small sips/bites Postural Changes: Seated upright at 90 degrees    Other  Recommendations Oral Care Recommendations: Oral care BID   Follow up Recommendations (TBD)      Frequency and Duration min 2x/week  2 weeks       Prognosis Prognosis for Safe Diet Advancement: Good      Swallow Study   General HPI: Jake Pound [name] a 61 y.o.malepresenting with worsening slurred speech for the past 2 weeks with right arm swelling. 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 CT showed multiple small chronic infarcts, chronic ischemic changes, and parenchymal volume loss of the brain, no acute abnormalities. MRI attempted and unable to complete. Critical care consulted and pt suspected to have cellulitis and acute hypercarbic resp failure exacerbated by benzos and underlying sleep apnea. ST consulted for hopeful po initiation. CXR 11/28 question small dependent effusions and some left lower lobe volume loss. Cardiomegaly. Type of Study: Bedside Swallow Evaluation Previous Swallow Assessment: (none found) Diet Prior to this Study: NPO Temperature Spikes Noted: No Respiratory Status: Nasal cannula History of Recent Intubation: No Behavior/Cognition: Alert;Cooperative;Pleasant  mood;Requires cueing Oral Cavity Assessment: Other (comment)(lingual candidias) Oral Care Completed by SLP: Yes Oral Cavity - Dentition: Adequate natural dentition Vision: Impaired for self-feeding(blind left eye) Self-Feeding Abilities: Able to feed self;Needs assist;Needs set up Patient Positioning: Upright in bed Baseline Vocal Quality: Normal Volitional Cough: Weak Volitional Swallow: Able to elicit    Oral/Motor/Sensory Function Overall Oral Motor/Sensory Function: Within functional limits   Ice Chips Ice chips: Within functional limits   Thin Liquid Thin Liquid: Within functional limits Presentation: Cup;Straw    Nectar Thick Nectar Thick Liquid: Not tested   Honey Thick Honey Thick Liquid: Not tested   Puree Puree: Impaired Presentation: Spoon Oral Phase Functional Implications: (labial residue)   Solid   GO   Solid: Within functional limits        Houston Siren 02/01/2017,11:05 AM  Orbie Pyo Colvin Caroli.Ed [employer] [phone]

## 2017-02-01 NOTE — Progress Notes (Signed)
RT transported patient to MRI and back without any complications.

## 2017-02-01 NOTE — Progress Notes (Signed)
Elevated dBP reported to elink RN.  He will report to MD.

## 2017-02-01 NOTE — Progress Notes (Signed)
Family Medicine Teaching Service Daily Progress Note Intern Pager: [phone]  Patient name: [name] Medical record number: [id number] Date of birth: 08-19-1955 Age: 61 y.o. Gender: male  Primary Care Provider: System, Pcp Not In Consultants: CCM Code Status: Full  Pt Overview and Major Events to Date:  11/28 - admitted for AMS, slurred speech 11/29 - transferred to ICU for worsening mental and respiratory status, placed on BiPAP 12/1 - transfer back to FM service  Assessment and Plan: [name] is a 61 y.o. male presenting with worsening slurred speech for the past 2 weeks with right arm swelling. 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  AMS/Slurred speech, improving:  Patient at [facility], has history of "not acting right" per nursing home. Patient endorsed two weeks of worsening "slurred speech" on admission. CT Head showed multiple small chronic infarcts, chronic ischemic changes, and parenchymal volume loss of the brain. 11/29 patient given Ativan to complete MRI brain but became increasingly agitated with inability to complete MRI. Patient became increasingly somnolent with reported apneic episodes. CCM consulted and transferred to ICU 11/29. ABG indicative of hypercarbic respiratory failure and placed on BiPAP. Patient did well on BiPAP with improved mental and respiratory status and became stable for transfer back to the floor 12/1.  Acute stroke not likely cause of presenting AMS due to no weakness in extremities, facial droop, and negative MRI for acute process however did show old embolic infarcts. Sepsis is another possibility as patient does have cellulitis with possible source of infection and hypothermia on admission, resolved with bear hugger. However had normal WBC and normal vitals. Started on broad spectrum antibiotics due to presenting status. Blood cultures NGx2d, so will  narrow antibiotics today. Additionally, another possible etiology to consider is seizure with known diagnosis of possible seizure DO on Keppra.  Patient did well without BiPAP last night and sating well on RA this morning but did have some desats to 80s when sleeping intermittently this morning. May require use of CPAP at night, no documented sleep study. AM cortisol 4.2., alk phos 218. Alb 2.3.  - S/p Vanc/Zosyn (11/28 - 12/1), plan to narrow to PO today  - continue home Keppra - keppra level  - transition IV steroids to PO  - consider CPAP use at night, may need sleep study outpatient - PT/OT eval  Right Upper Extremity Swelling and Anasarca:  Presented with RUE erythema concerning for cellulitis. Blood cultures NGx2d. Hypothermia resolved with bear hugger. Currently on Vanc/Zosyn (11/28 - ). Overall edematous state on admission. CXR showed cardiomegaly with small dependent effusions and some left lower lobe volume loss.  Most likely etiologies include protein calorie malnutrition with low albumin to 2.3 (chronically low), and worsening right heart failure causing anasarca. His last ECHO in 09/2016 showed EF 20-25%, diffuse LV hypokinesis, and elevated pulmonary artery pressure to 52mHg. BNP is elevated to 3205 but down from last admission (3536). Received 2 doses IV Lasix in ICU 11/30. Trace pitting edema to LE, 1+ edema to R arm but with significantly decreased erythema. Not anemic, Hb 13.7. AM cortisol slightly low at 4.2.  - s/p Vanc and Zosyn (11/28 - 12/1), plan to narrow antibiotics today - consider repeat ECHO  HFrEF: Patient did not appear to be in acute exacerbation on admission, however he did present with atypical symptoms at last admission in acute exacerbation b/c he quit taking his meds. Weight  of 195lbs on admission which appears to be his baseline. His next recorded weights are 234, 236, 241 today which doubt the accuracy. CXR showed cardiomegaly with questionable small dependent  effusions and some left lower lobe volume loss. Continue to monitor. S/p IV Lasix '20mg'$  in ICU 11/30. - Continue home meds Bisoprolol, Entresto - Strict I's/O's - Daily weights - transition to home PO Lasix '40mg'$  BID today  HTN: SBP [id number]; diastolic 30-865 the last 24 hours. - Continue home meds Bisoprolol '5mg'$  BID and Entresto BID - Consider increasing Bisoprolol if diastolic continues to be elevated - transition to home PO Lasix '40mg'$  BID today  HLD: Patient has history of hyperlipidemia well controlled on statin. - Continue home Atorvastatin '40mg'$  daily  T2DM: Patient takes metformin '500mg'$  BID. A1c is 8.0. CBG this am 148 12/1. - CBGs at bedtime and before meals - sSSI and monitor CBGs.   Elevated Alkaline Phosphatase: chronic. No prior GGT on file, 164 this admission.  Seizure Disorder: Patient continues to have no symptoms of seizure. EEG obtained due to AMS, no seizure activity identified, indicative of encephalopathy. - Continue Levetiracetam '750mg'$  BID - keppra level  Urinary Retention: Patient is taking flomax at nursing home to help with urinary retention. - Continue Flomax  FEN/GI: Dysphagia 2 Prophylaxis: Lovenox  Disposition: continue inpatient admission for AMS  Subjective:  Patient feels well today, no complaints. Did not require CPAP overnight.  Objective: Temp:  [97.5 F (36.4 C)-98 F (36.7 C)] 98 F (36.7 C) (12/01 1155) Pulse Rate:  [61-71] 66 (12/01 1205) Resp:  [13-29] 20 (12/01 1205) BP: (130-163)/(90-121) 162/108 (12/01 1205) SpO2:  [88 %-100 %] 89 % (12/01 1205) Weight:  [240 lb 1.3 oz (108.9 kg)-241 lb 3.2 oz (109.4 kg)] 241 lb 3.2 oz (109.4 kg) (12/01 0844) Physical Exam: General: obese male lying on bed, A&Ox3 Cardiovascular: RRR, no murmurs/rubs/gallops Respiratory: CTA bilaterally, no wheezes/crackles Abdomen: soft, +BS, non distended Extremities: trace pitting edema LE. R arm with decrased erythema, no warmth, some edema. No  extension beyond margins drawn on admission.  Laboratory: Recent Labs  Lab 01/30/17 2037 01/31/17 0438 02/01/17 0532  WBC 5.9 6.1 6.1  HGB 13.8 13.7 13.8  HCT 42.2 43.0 42.7  PLT 253 253 249   Recent Labs  Lab 01/30/17 1310 01/30/17 2037 01/31/17 0438 02/01/17 0532  NA 140  --  141 140  K 3.7  --  3.7 3.9  CL 108  --  109 109  CO2 27  --  23 25  BUN 24*  --  23* 21*  CREATININE 0.93 0.89 0.88 0.93  CALCIUM 8.4*  --  8.6* 8.6*  PROT 5.9*  --  6.0* 5.4*  BILITOT 1.1  --  1.3* 1.2  ALKPHOS 233*  --  218* 179*  ALT 21  --  18 16*  AST 27  --  23 17  GLUCOSE 89  --  81 150*    Imaging/Diagnostic Tests: Mr Brain Wo Contrast  Result Date: 02/01/2017 CLINICAL DATA:  Worsened slurred speech over the last 2 weeks. Right arm cellulitis. EXAM: MRI HEAD WITHOUT CONTRAST TECHNIQUE: Multiplanar, multiecho pulse sequences of the brain and surrounding structures were obtained without intravenous contrast. COMPARISON:  CT 01/30/2017.  MRI 10/19/2016. FINDINGS: Brain: Diffusion imaging does not show any acute or subacute infarction. The brainstem shows T2 signal throughout the pons which appears the same as was seen in August of this year. Findings probably represent chronic small-vessel change, but there is no volume loss  or focal lacunar injury. Few old small vessel cerebellar infarctions. 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 These appear the same as were seen previously. No evidence of recent infarction, mass lesion, acute hemorrhage, hydrocephalus or extra-axial collection. Vascular: Major vessels at the base of the brain show flow. Skull and upper cervical spine: Negative Sinuses/Orbits: Sinuses are clear. There are bilateral mastoid effusions. Markedly abnormal appearance of the right  globe, possibly secondary to ocular hemorrhage or treatment for detached retina. This was not present on the previous MRI. Other: None IMPRESSION: No acute intracranial finding. Chronic small-vessel ischemic changes of the pons and cerebellum. 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 No new finding. No abnormal appearance to the right globe consistent with hemorrhage or treatment for detached retina. Electronically Signed   By: Nelson Chimes M.D.   On: 02/01/2017 07:05   11/29 EEG EEG Abnormalities:  1) generalized irregular slow activity 2) slow PDR Clinical Interpretation: This EEG is consistent with a generalized non-specific cerebral dysfunction(encephalopathy). There was no seizure or seizure predisposition recorded on this study.   Rory Percy, DO 02/02/2017, 12:51 PM PGY-1, [place] [facility] Intern pager: [phone], text pages welcome

## 2017-02-02 DIAGNOSIS — J9692 Respiratory failure, unspecified with hypercapnia: Secondary | ICD-10-CM

## 2017-02-02 LAB — CBC WITH DIFFERENTIAL/PLATELET
Basophils Absolute: 0 10*3/uL (ref 0.0–0.1)
Basophils Relative: 0 %
Eosinophils Absolute: 0 10*3/uL (ref 0.0–0.7)
Eosinophils Relative: 0 %
HCT: 42.3 % (ref 39.0–52.0)
Hemoglobin: 13.7 g/dL (ref 13.0–17.0)
Lymphocytes Relative: 5 %
Lymphs Abs: 0.4 10*3/uL — ABNORMAL LOW (ref 0.7–4.0)
MCH: 26.3 pg (ref 26.0–34.0)
MCHC: 32.4 g/dL (ref 30.0–36.0)
MCV: 81.3 fL (ref 78.0–100.0)
Monocytes Absolute: 0.1 10*3/uL (ref 0.1–1.0)
Monocytes Relative: 2 %
Neutro Abs: 7.6 10*3/uL (ref 1.7–7.7)
Neutrophils Relative %: 93 %
Platelets: 233 10*3/uL (ref 150–400)
RBC: 5.2 MIL/uL (ref 4.22–5.81)
RDW: 17.9 % — ABNORMAL HIGH (ref 11.5–15.5)
WBC: 8.2 10*3/uL (ref 4.0–10.5)

## 2017-02-02 LAB — GLUCOSE, CAPILLARY
Glucose-Capillary: 148 mg/dL — ABNORMAL HIGH (ref 65–99)
Glucose-Capillary: 150 mg/dL — ABNORMAL HIGH (ref 65–99)
Glucose-Capillary: 136 mg/dL — ABNORMAL HIGH (ref 65–99)
Glucose-Capillary: 173 mg/dL — ABNORMAL HIGH (ref 65–99)
Glucose-Capillary: 167 mg/dL — ABNORMAL HIGH (ref 65–99)

## 2017-02-02 LAB — BASIC METABOLIC PANEL
Anion gap: 6 (ref 5–15)
BUN: 26 mg/dL — ABNORMAL HIGH (ref 6–20)
CO2: 27 mmol/L (ref 22–32)
Calcium: 8.5 mg/dL — ABNORMAL LOW (ref 8.9–10.3)
Chloride: 102 mmol/L (ref 101–111)
Creatinine, Ser: 1.01 mg/dL (ref 0.61–1.24)
GFR calc Af Amer: >60 mL/min (ref >60)
GFR calc non Af Amer: >60 mL/min (ref >60)
Glucose, Bld: 167 mg/dL — ABNORMAL HIGH (ref 65–99)
Potassium: 4 mmol/L (ref 3.5–5.1)
Sodium: 135 mmol/L (ref 135–145)

## 2017-02-02 MED ORDER — ENSURE ENLIVE PO LIQD
237.0000 mL | Freq: Two times a day (BID) | ORAL | Status: DC
  Administered 2017-02-02 – 2017-02-05 (×6): 237 mL via ORAL

## 2017-02-02 MED ORDER — FUROSEMIDE 10 MG/ML IJ SOLN
80.0000 mg | Freq: Once | INTRAMUSCULAR | Status: AC
  Administered 2017-02-02: 80 mg via INTRAVENOUS
  Filled 2017-02-02: qty 8

## 2017-02-02 MED ORDER — SENNA 8.6 MG PO TABS
1.0000 | ORAL_TABLET | Freq: Every day | ORAL | Status: DC
  Administered 2017-02-02 – 2017-02-07 (×6): 8.6 mg via ORAL
  Filled 2017-02-02 (×7): qty 1

## 2017-02-02 MED ORDER — PREDNISONE 20 MG PO TABS
40.0000 mg | ORAL_TABLET | Freq: Every day | ORAL | Status: DC
  Administered 2017-02-02 – 2017-02-03 (×2): 40 mg via ORAL
  Filled 2017-02-02 (×2): qty 2

## 2017-02-02 MED ORDER — ADULT MULTIVITAMIN LIQUID CH
15.0000 mL | Freq: Every day | ORAL | Status: DC
  Administered 2017-02-02 – 2017-02-05 (×4): 15 mL via ORAL
  Filled 2017-02-02 (×7): qty 15

## 2017-02-02 MED ORDER — LEVETIRACETAM 750 MG PO TABS
750.0000 mg | ORAL_TABLET | Freq: Two times a day (BID) | ORAL | Status: DC
  Administered 2017-02-02 – 2017-02-07 (×11): 750 mg via ORAL
  Filled 2017-02-02 (×12): qty 1

## 2017-02-02 MED ORDER — PRO-STAT SUGAR FREE PO LIQD
30.0000 mL | Freq: Three times a day (TID) | ORAL | Status: DC
  Administered 2017-02-02 – 2017-02-06 (×12): 30 mL via ORAL
  Filled 2017-02-02 (×15): qty 30

## 2017-02-02 MED ORDER — POLYETHYLENE GLYCOL 3350 17 G PO PACK
17.0000 g | PACK | Freq: Every day | ORAL | Status: DC
  Administered 2017-02-02 – 2017-02-05 (×4): 17 g via ORAL
  Filled 2017-02-02 (×5): qty 1

## 2017-02-02 MED ORDER — PREDNISONE 20 MG PO TABS: 40.0000 mg | ORAL_TABLET | Freq: Every day | ORAL | Status: DC

## 2017-02-02 MED ORDER — FUROSEMIDE 40 MG PO TABS: 40.0000 mg | ORAL_TABLET | Freq: Two times a day (BID) | ORAL | Status: DC

## 2017-02-02 MED ORDER — CEPHALEXIN 500 MG PO CAPS
500.0000 mg | ORAL_CAPSULE | Freq: Four times a day (QID) | ORAL | Status: AC
  Administered 2017-02-02 – 2017-02-06 (×16): 500 mg via ORAL
  Filled 2017-02-02 (×16): qty 1

## 2017-02-02 NOTE — Progress Notes (Signed)
Patient states he "not feeling well" , when asked he cant explain what doesn't feel well, patient had condom cath placed around 1:00 pm, condom was off on floor, penis swelling increased, paged provider to come bedside, orders placed. Will continue to monitor.   5:35 Paged provider: post foley inversion 146mls urine removed, bladder scanned- 542mls still in bladder, provider advised to give lasix. Will continue to monitor closely.

## 2017-02-02 NOTE — Progress Notes (Signed)
Patient's report received from [place]. Patient arrived at the unit at 2300. Patient is alert and oriented, able to answer all questions correctly.  Skin assessment done with [place] Jansky and wound asessment done with Jasmine. Patient is blind in left eye, multiple wounds to BLE, with right BKA. Slight weakness to  All extremities.  Patient was oriented to the room and made comfortable in his bed Places on monitor at bedside, respirations are even and unlabored, will continue to monitor.

## 2017-02-02 NOTE — Progress Notes (Signed)
Patient placed on CPAP and tolerating for now. Patient couldn't use more than lowest setting at current time will use as much as possible

## 2017-02-02 NOTE — Progress Notes (Signed)
  Interim progress note  Paged by RN Aldona Bar due to concerns over penile swelling and patient stating he felt "something not right". Went to assess patient. In bed in NAD, O2 sat 92% on room air. BP elevated 161/100. Patient able to answer questions appropriately although speech is somewhat slowed. He denies pain. Patient with significant penile swelling, pitting edema of legs, abdomen. Arms with swelling as well. Will order CBC, BMP. Asked nurse to replace Foley- this was discontinued earlier to reduce risk of CAUTI, condom cath was in place as patient has difficulty using bedpan. If Foley placed will give 80 IV lasix. Patient not in any respiratory distress as he has been other times during this hospitalization- low threshold to place on respiratory support. Low threshold to get head CT if mental status worsens. Will continue to monitor closely.  Lucila Maine, DO PGY-2, [facility] 02/02/2017 5:11 PM Pager [phone]

## 2017-02-02 NOTE — Progress Notes (Signed)
Nutrition Brief Note  Patient identified on the Malnutrition Screening Tool (MST) Report  Wt Readings from Last 15 Encounters:  02/02/17 241 lb 3.2 oz (109.4 kg)  12/31/16 195 lb (88.5 kg)  10/25/16 210 lb (95.3 kg)  10/19/16 210 lb 6.4 oz (95.4 kg)  10/18/16 210 lb (95.3 kg)  10/09/16 207 lb (93.9 kg)  09/30/16 207 lb 10.8 oz (94.2 kg)  09/10/16 194 lb (88 kg)  08/28/16 194 lb (88 kg)  08/23/16 211 lb (95.7 kg)  08/15/16 224 lb (101.6 kg)  08/10/16 224 lb (101.6 kg)  08/03/16 224 lb (101.6 kg)  07/27/16 224 lb (101.6 kg)  07/23/16 225 lb (102.1 kg)   Pt screened on MST for having poor appetite and unintentional weight loss. However, he was admitted yesterday with AMS as Level 5 Caveat.   Today, he says he "thinks I have been doing pretty well" in regards to eating. Denies N/V. He does not drink supplements. He takes vitamin D. He says his UBW is 190? Given severe swelling, unable to trend wt  No nutrition interventions warranted at this time. If nutrition issues arise, please consult RD.   Pt with chronic wounds and although eating well (80% meals) will order Prostat TID and mvi with minerals to support healing  Burtis Junes RD, LDN, CNSC Clinical Nutrition Pager: [phone] 02/02/2017 10:07 AM

## 2017-02-03 LAB — GLUCOSE, CAPILLARY
Glucose-Capillary: 128 mg/dL — ABNORMAL HIGH (ref 65–99)
Glucose-Capillary: 215 mg/dL — ABNORMAL HIGH (ref 65–99)
Glucose-Capillary: 192 mg/dL — ABNORMAL HIGH (ref 65–99)
Glucose-Capillary: 196 mg/dL — ABNORMAL HIGH (ref 65–99)

## 2017-02-03 LAB — COMPREHENSIVE METABOLIC PANEL
ALT: 25 U/L (ref 17–63)
AST: 28 U/L (ref 15–41)
Albumin: 2.1 g/dL — ABNORMAL LOW (ref 3.5–5.0)
Alkaline Phosphatase: 173 U/L — ABNORMAL HIGH (ref 38–126)
Anion gap: 9 (ref 5–15)
BUN: 29 mg/dL — ABNORMAL HIGH (ref 6–20)
CO2: 27 mmol/L (ref 22–32)
Calcium: 8.7 mg/dL — ABNORMAL LOW (ref 8.9–10.3)
Chloride: 103 mmol/L (ref 101–111)
Creatinine, Ser: 1.12 mg/dL (ref 0.61–1.24)
GFR calc Af Amer: >60 mL/min (ref >60)
GFR calc non Af Amer: >60 mL/min (ref >60)
Glucose, Bld: 143 mg/dL — ABNORMAL HIGH (ref 65–99)
Potassium: 3.8 mmol/L (ref 3.5–5.1)
Sodium: 139 mmol/L (ref 135–145)
Total Bilirubin: 1 mg/dL (ref 0.3–1.2)
Total Protein: 5.4 g/dL — ABNORMAL LOW (ref 6.5–8.1)

## 2017-02-03 LAB — LEVETIRACETAM LEVEL: Levetiracetam Lvl: 26.2 ug/mL (ref 10.0–40.0)

## 2017-02-03 LAB — CBC
HCT: 41.6 % (ref 39.0–52.0)
Hemoglobin: 13.4 g/dL (ref 13.0–17.0)
MCH: 25.9 pg — ABNORMAL LOW (ref 26.0–34.0)
MCHC: 32.2 g/dL (ref 30.0–36.0)
MCV: 80.3 fL (ref 78.0–100.0)
Platelets: 244 10*3/uL (ref 150–400)
RBC: 5.18 MIL/uL (ref 4.22–5.81)
RDW: 17.6 % — ABNORMAL HIGH (ref 11.5–15.5)
WBC: 7.8 10*3/uL (ref 4.0–10.5)

## 2017-02-03 MED ORDER — PREDNISONE 20 MG PO TABS
30.0000 mg | ORAL_TABLET | Freq: Every day | ORAL | Status: DC
  Administered 2017-02-04 – 2017-02-06 (×3): 30 mg via ORAL
  Filled 2017-02-03 (×4): qty 1

## 2017-02-03 MED ORDER — GLYCERIN (LAXATIVE) 2.1 G RE SUPP
1.0000 | Freq: Once | RECTAL | Status: AC
  Administered 2017-02-03: 1 via RECTAL
  Filled 2017-02-03: qty 1

## 2017-02-03 MED ORDER — FUROSEMIDE 10 MG/ML IJ SOLN
80.0000 mg | Freq: Every day | INTRAMUSCULAR | Status: DC
  Administered 2017-02-03 – 2017-02-05 (×3): 80 mg via INTRAVENOUS
  Filled 2017-02-03 (×4): qty 8

## 2017-02-03 NOTE — Progress Notes (Signed)
Patient has been intermittently moaning, when asked he says that he is not in pain, bladder scan shows 432 mls in the bladder, provider notified and asked for consult for urology as concern for obstruction.

## 2017-02-03 NOTE — Progress Notes (Signed)
Scrotum suspensory support in place and 24 hour urine collection started.

## 2017-02-03 NOTE — Evaluation (Signed)
Physical Therapy Evaluation Patient Details Name: [name] MRN: [id number] DOB: 06/13/1955 Today's Date: 02/03/2017   History of Present Illness   61 yo male with PMH CHF, HTN, HLD, DM2, seizure disorder, chronic nonhealing wounds on LLE, right BKA, cataract L eye; admitted 11/28 with worsening slurred speech x2 weeks with right arm swelling, found to be hypotherimic.  CT head = multiple small chronic infarcts, chronic ischemic changes, parenchymal volume loss; too agitated for MRI; BiPAP for hypercarbaric respiratory failure, now improved. DDx cellulitis, seizure.  BP remains elevated.   Clinical Impression  Pt presents with severe limitations to functional mobility and significant decline compared to recent baseline at SNF.  Limited in even basic mobility due to scrotal swelling and generalized weakness, currently total assistance required.  Recommend resume therapies in SNF following acute hospitalization.       Follow Up Recommendations SNF    Equipment Recommendations  None recommended by PT    Recommendations for Other Services       Precautions / Restrictions Precautions Precautions: Fall Precaution Comments: bed alarm, rails Restrictions Weight Bearing Restrictions: No      Mobility  Bed Mobility Overal bed mobility: Needs Assistance Bed Mobility: Rolling;Supine to Sit Rolling: Total assist;+2 for physical assistance   Supine to sit: (unable)     General bed mobility comments: pt declined moving to EOB due to anasarca/scrotal swelling, agreeable to assess bed mobility, able to rotate trunk but cannot/willnot carry legs with trunk into sidelying (scrotal swelling)  Transfers                 General transfer comment: unable at this time  Ambulation/Gait             General Gait Details: unable  Stairs            Wheelchair Mobility    Modified Rankin (Stroke Patients Only)       Balance                                              Pertinent Vitals/Pain Pain Assessment: No/denies pain    Home Living Family/patient expects to be discharged to:: Skilled nursing facility                      Prior Function Level of Independence: Needs assistance   Gait / Transfers Assistance Needed: Pt uses WC, states he self-propels, was working on slideboard transfers at [facility], has prosthesis but can't wear due to LE edema           Hand Dominance   Dominant Hand: Right    Extremity/Trunk Assessment   Upper Extremity Assessment Upper Extremity Assessment: Generalized weakness;Defer to OT evaluation    Lower Extremity Assessment Lower Extremity Assessment: Generalized weakness;RLE deficits/detail;LLE deficits/detail(note multiple wounds consistent with previous PT note) RLE Deficits / Details: s/p bka, able to flex hip, knee with fair (3+/5) strenght LLE Deficits / Details: incr hamstring tightness limiting quad/ROM at knee, and due to scrotal swelling unable to internally rotate to neutral.  air boot on foot;        Communication   Communication: Expressive difficulties  Cognition Arousal/Alertness: Awake/alert(goes to sleep right after) Behavior During Therapy: WFL for tasks assessed/performed Overall Cognitive Status: No family/caregiver present to determine baseline cognitive functioning  General Comments: appears to have some memory issues/confusion, but does seem to understand and mostly appropriately answer questions; mildly slow to respond      General Comments      Exercises     Assessment/Plan    PT Assessment All further PT needs can be met in the next venue of care  PT Problem List Decreased strength;Decreased activity tolerance;Decreased range of motion;Decreased mobility;Decreased cognition;Decreased knowledge of use of DME;Obesity;Decreased skin integrity       PT Treatment Interventions      PT Goals (Current goals  can be found in the Care Plan section)  Acute Rehab PT Goals PT Goal Formulation: All assessment and education complete, DC therapy    Frequency     Barriers to discharge        Co-evaluation               AM-PAC PT "6 Clicks" Daily Activity  Outcome Measure Difficulty turning over in bed (including adjusting bedclothes, sheets and blankets)?: Unable Difficulty moving from lying on back to sitting on the side of the bed? : Unable Difficulty sitting down on and standing up from a chair with arms (e.g., wheelchair, bedside commode, etc,.)?: Unable Help needed moving to and from a bed to chair (including a wheelchair)?: Total Help needed walking in hospital room?: Total Help needed climbing 3-5 steps with a railing? : Total 6 Click Score: 6    End of Session   Activity Tolerance: Patient limited by fatigue Patient left: in bed;with call bell/phone within reach Nurse Communication: Mobility status PT Visit Diagnosis: Muscle weakness (generalized) (M62.81);Difficulty in walking, not elsewhere classified (R26.2)    Time: 1020-1040 PT Time Calculation (min) (ACUTE ONLY): 20 min   Charges:   PT Evaluation $PT Eval Moderate Complexity: 1 Mod     PT G Codes:   PT G-Codes **NOT FOR INPATIENT CLASS** Functional Assessment Tool Used: AM-PAC 6 Clicks Basic Mobility Functional Limitation: Changing and maintaining body position Changing and Maintaining Body Position Current Status (F3692): 100 percent impaired, limited or restricted Changing and Maintaining Body Position Discharge Status (O3009): 100 percent impaired, limited or restricted    [name], PT, DPT, MS Board Certified Geriatric Clinical Specialist  [name] 02/03/2017, 10:54 AM

## 2017-02-03 NOTE — Progress Notes (Signed)
Called by RN for a second set of eyes.  Patient has been moaning and has urine retention with foley in place.  Per RN, patient was bladder scanned and noted to have > 400 cc in bladder with foley. On my arrival to patients room, RN at bedside.  Patient is resting, easily arousable denies pain but moaning.  Abd large and soft.  AS per RN yesterday streaks of blood were noted in foley tubing, today urine is dark with few small clots.  Flushed catheter with 50cc of sterile water to ensure no clot at end of tubing, only 50 cc of urine returned.  Recommend monitoring urine output and bladder scan periodically and update MD with results throughout the day.

## 2017-02-03 NOTE — Progress Notes (Signed)
Patient refused CPAP tonight. Michela Pitcher it was uncomfortable and he does not wear on at home.

## 2017-02-03 NOTE — Progress Notes (Addendum)
Patient called out for the nurse saying he was in the floor, he was still in his bed, upon assessment he is oriented x4 and has yellow discharge from penis and foley, foley care performed,  page provider to make aware.  4:05 no call back, paged again  4:15 text paged family med resident on [facility]

## 2017-02-03 NOTE — Progress Notes (Signed)
Family Medicine Teaching Service Daily Progress Note Intern Pager: [phone]  Patient name: [name] Medical record number: [id number] Date of birth: May 30, 1955 Age: 61 y.o. Gender: male  Primary Care Provider: System, Pcp Not In Consultants: CCM Code Status: Full  Pt Overview and Major Events to Date:  11/28 - admitted for AMS, slurred speech 11/29 - transferred to ICU for worsening mental and respiratory status, placed on BiPAP 12/1 - transfer back to FM service  Assessment and Plan: [name] is a 61 y.o. male presenting with worsening slurred speech for the past 2 weeks with right arm swelling. 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  AMS/Slurred speech, resolved likely due to Respiratory failure:  Patient at [facility], has history of "not acting right" per nursing home. Patient endorsed two weeks of worsening "slurred speech" on admission. CT Head showed multiple small chronic infarcts, chronic ischemic changes, and parenchymal volume loss of the brain, MRI without acute changes.  Patient became increasingly somnolent with reported apneic episodes. CCM consulted and transferred to ICU 11/29. ABG indicative of hypercarbic respiratory failure and placed on BiPAP. Patient did well on BiPAP with improved mental and respiratory status and became stable for transfer back to the floor 12/1. Given hx of seizures also possibly a seizure - Currently on stress dose of prednisone - will taper this to 30 mg today  - continue home Keppra - keppra le vel pending  - CPAP at night - thought no documented OSA - will consult pulmonology about this tomorrow to see if he can go home with CPAP - PT/OT eval   RUE Cellulitis:  Presented with RUE erythema concerning for cellulitis. No pain, no significant erythema, significant swelling. Blood cultures NGx2d. Hypothermia resolved with bear hugger.  Previously on Vanc/Zosyn (11/28 -12/1 ). RUE ultrasound negative for blood clot  - If continues to worsening, could consider CT chest for possibly thoracic outlet syndrome  - Continue Keflex (12/1-)  HFrEF Exacerbation: Hypervolemic on exam - Anasarca. Significant scrotal/penile edema - non-tender. Weight increasing from admission from 234 > 244 lbs today, though unsure if accurate. 2.8 Liters output yesterday. His last ECHO in 09/2016 showed EF 20-25%, diffuse LV hypokinesis, and elevated pulmonary artery pressure to 32mmHg. BNP is elevated to 3205 but down from last admission (3536). S/p IV Lasix 20mg  in ICU 11/30. Initial transitioned to home lasix on 12/1,concern for worsening edema yesterday - 80 IV lasix x 1 yesterday  - Continue home meds Bisoprolol, Entresto - Continue IV lasix 80 mg daily  - consider cardiology consult tomorrow if no improvement  - Strict I's/O's - Daily weights  HTN: Slight hypertensive, starting on aggressive diuresis so will continue to watch  - Continue home meds Bisoprolol 5mg  BID and Entresto BID - Lasix 80 IV   Hypoalbunemia : Likely due to malnutrition. UA with protein, will check 24 hour urine protein - to make sure kidney's not spill significant amount of protein  - 24 hour urine albumin  - Will consult nutrition   HLD: Patient has history of hyperlipidemia well controlled on statin. - Continue home Atorvastatin 40mg  daily  T2DM: Patient takes metformin 500mg  BID. A1c is 8.0. CBG this am 148 12/1. - CBGs at bedtime and before meals - sSSI and monitor CBGs.   Elevated Alkaline Phosphatase: chronic. No prior GGT on file, - continues to be elevated  Seizure Disorder: Patient continues to have  no symptoms of seizure. EEG obtained due to AMS, no seizure activity identified, indicative of encephalopathy. - Continue Levetiracetam 750mg  BID - keppra level  Urinary Retention: Patient is taking flomax at nursing home to help with urinary retention. -  Continue Flomax - Urine catheter in place   FEN/GI: Dysphagia 2 Prophylaxis: Lovenox  Disposition: Home   Subjective:  Patient denies any pain today. Denies any SOB, palpations. No pain in the scrotum. States he is doing well.   Objective: Temp:  [96.7 F (35.9 C)-98 F (36.7 C)] 97.6 F (36.4 C) (12/02 0835) Pulse Rate:  [33-71] 69 (12/02 0750) Resp:  [11-21] 17 (12/02 0750) BP: (135-162)/(96-127) 146/110 (12/02 0835) SpO2:  [82 %-97 %] 91 % (12/02 0750) Weight:  [243 lb 9.7 oz (110.5 kg)-244 lb 11.4 oz (111 kg)] 244 lb 11.4 oz (111 kg) (12/02 0500) Physical Exam: General: obese male lying on bed, A&Ox3 Cardiovascular: RRR, no murmurs/rubs/gallops Respiratory: CTA bilaterally, no wheezes/crackles Abdomen: soft, +BS, non distended Extremities: trace pitting edema LE. R arm with decrased erythema, no warmth, some edema. No extension beyond margins drawn on admission.  Laboratory: Recent Labs  Lab 02/01/17 0532 02/02/17 1654 02/03/17 0358  WBC 6.1 8.2 7.8  HGB 13.8 13.7 13.4  HCT 42.7 42.3 41.6  PLT 249 233 244   Recent Labs  Lab 01/31/17 0438 02/01/17 0532 02/02/17 1654 02/03/17 0358  NA 141 140 135 139  K 3.7 3.9 4.0 3.8  CL 109 109 102 103  CO2 23 25 27 27   BUN 23* 21* 26* 29*  CREATININE 0.88 0.93 1.01 1.12  CALCIUM 8.6* 8.6* 8.5* 8.7*  PROT 6.0* 5.4*  --  5.4*  BILITOT 1.3* 1.2  --  1.0  ALKPHOS 218* 179*  --  173*  ALT 18 16*  --  25  AST 23 17  --  28  GLUCOSE 81 150* 167* 143*    Imaging/Diagnostic Tests: No results found. 11/29 EEG EEG Abnormalities:  1) generalized irregular slow activity 2) slow PDR Clinical Interpretation: This EEG is consistent with a generalized non-specific cerebral dysfunction(encephalopathy). There was no seizure or seizure predisposition recorded on this study.   Tonette Bihari, MD 02/03/2017, 9:06 AM PGY-3, [place] Intern pager: [phone], text pages welcome

## 2017-02-03 NOTE — Progress Notes (Signed)
Scrotum strap ordered to elevate.

## 2017-02-04 ENCOUNTER — Other Ambulatory Visit: Payer: Self-pay

## 2017-02-04 ENCOUNTER — Inpatient Hospital Stay (HOSPITAL_COMMUNITY): Payer: Medicaid Other

## 2017-02-04 DIAGNOSIS — I499 Cardiac arrhythmia, unspecified: Secondary | ICD-10-CM

## 2017-02-04 DIAGNOSIS — L98499 Non-pressure chronic ulcer of skin of other sites with unspecified severity: Secondary | ICD-10-CM

## 2017-02-04 DIAGNOSIS — T68XXXD Hypothermia, subsequent encounter: Secondary | ICD-10-CM

## 2017-02-04 DIAGNOSIS — I509 Heart failure, unspecified: Secondary | ICD-10-CM

## 2017-02-04 DIAGNOSIS — L89892 Pressure ulcer of other site, stage 2: Secondary | ICD-10-CM

## 2017-02-04 LAB — CULTURE, BLOOD (ROUTINE X 2)
Culture: NO GROWTH
Culture: NO GROWTH
Special Requests: ADEQUATE
Special Requests: ADEQUATE

## 2017-02-04 LAB — PROTEIN, URINE, 24 HOUR
Collection Interval-UPROT: 20 hours
Protein, 24H Urine: 983 mg/d — ABNORMAL HIGH (ref 50–100)
Protein, Urine: 18 mg/dL
Urine Total Volume-UPROT: 4550 mL

## 2017-02-04 LAB — GLUCOSE, CAPILLARY
Glucose-Capillary: 173 mg/dL — ABNORMAL HIGH (ref 65–99)
Glucose-Capillary: 112 mg/dL — ABNORMAL HIGH (ref 65–99)
Glucose-Capillary: 200 mg/dL — ABNORMAL HIGH (ref 65–99)
Glucose-Capillary: 231 mg/dL — ABNORMAL HIGH (ref 65–99)
Glucose-Capillary: 358 mg/dL — ABNORMAL HIGH (ref 65–99)

## 2017-02-04 LAB — COMPREHENSIVE METABOLIC PANEL
ALT: 33 U/L (ref 17–63)
AST: 34 U/L (ref 15–41)
Albumin: 2.2 g/dL — ABNORMAL LOW (ref 3.5–5.0)
Alkaline Phosphatase: 187 U/L — ABNORMAL HIGH (ref 38–126)
Anion gap: 7 (ref 5–15)
BUN: 31 mg/dL — ABNORMAL HIGH (ref 6–20)
CO2: 31 mmol/L (ref 22–32)
Calcium: 8.8 mg/dL — ABNORMAL LOW (ref 8.9–10.3)
Chloride: 102 mmol/L (ref 101–111)
Creatinine, Ser: 1.12 mg/dL (ref 0.61–1.24)
GFR calc Af Amer: >60 mL/min (ref >60)
GFR calc non Af Amer: >60 mL/min (ref >60)
Glucose, Bld: 123 mg/dL — ABNORMAL HIGH (ref 65–99)
Potassium: 3.7 mmol/L (ref 3.5–5.1)
Sodium: 140 mmol/L (ref 135–145)
Total Bilirubin: 0.9 mg/dL (ref 0.3–1.2)
Total Protein: 5.7 g/dL — ABNORMAL LOW (ref 6.5–8.1)

## 2017-02-04 LAB — CBC
HCT: 43.8 % (ref 39.0–52.0)
Hemoglobin: 14 g/dL (ref 13.0–17.0)
MCH: 25.7 pg — ABNORMAL LOW (ref 26.0–34.0)
MCHC: 32 g/dL (ref 30.0–36.0)
MCV: 80.4 fL (ref 78.0–100.0)
Platelets: 248 10*3/uL (ref 150–400)
RBC: 5.45 MIL/uL (ref 4.22–5.81)
RDW: 17.7 % — ABNORMAL HIGH (ref 11.5–15.5)
WBC: 7.2 10*3/uL (ref 4.0–10.5)

## 2017-02-04 LAB — ECHOCARDIOGRAM COMPLETE
Height: 70 in
Weight: 3756.64 oz

## 2017-02-04 MED ORDER — ACETAMINOPHEN 325 MG PO TABS
650.0000 mg | ORAL_TABLET | Freq: Four times a day (QID) | ORAL | Status: DC | PRN
  Administered 2017-02-04 – 2017-02-05 (×2): 650 mg via ORAL
  Filled 2017-02-04 (×2): qty 2

## 2017-02-04 MED ORDER — INSULIN ASPART 100 UNIT/ML ~~LOC~~ SOLN: 0.0000 [IU] | Freq: Every day | SUBCUTANEOUS | Status: DC

## 2017-02-04 NOTE — Progress Notes (Signed)
FPTS Interim Progress Note  S: Discussed patient with cardiology coordinator. Per coordinator, Dr. Haroldine Laws has reviewed EKG and states it is not concerning and there is no need for cardiology consultation.   O: BP (!) 143/103   Pulse 65   Temp 97.8 F (36.6 C) (Oral)   Resp 18   Ht 5\' 10"  (1.778 m)   Wt 234 lb 12.6 oz (106.5 kg)   SpO2 95%   BMI 33.69 kg/m     A/P: EKG changes -continue to monitor -echo pending -cardiology consulted, state there is no need for consultation  Caroline More, DO 02/04/2017, 2:48 PM PGY-1, [facility] Medicine Service pager [phone]

## 2017-02-04 NOTE — Progress Notes (Signed)
CCMD reported patient having frequent PVC's, PAC's and a missed beat, provider notified.

## 2017-02-04 NOTE — NC FL2 (Signed)
[place] LEVEL OF CARE SCREENING TOOL     IDENTIFICATION  Patient Name: [name] Birthdate: 06-13-1955 Sex: male Admission Date (Current Location): 01/30/2017  [facility] and [place] Number:  Herbalist and Address:  [facility], [place] [address] [place], [place] [address]      Provider Number: [id number]  Attending Physician Name and Address:  Lind Covert, MD  Relative Name and Phone Number:  Danton Clap sister, [phone]    Current Level of Care: Hospital Recommended Level of Care: Industry Prior Approval Number:    Date Approved/Denied:   PASRR Number: [id number] F End date 02/28/17  Discharge Plan: SNF    Current Diagnoses: Patient Active Problem List   Diagnosis Date Noted  . Acute encephalopathy 01/31/2017  . Cellulitis of right upper extremity   . Anasarca   . Altered mental status   . Slurred speech   . Hypothermia 01/30/2017  . Pressure injury of skin 12/21/2016  . CHF exacerbation ([place]) 12/20/2016  . Peripheral edema   . Benign prostatic hyperplasia with lower urinary tract symptoms   . Embolic stroke ([place]) 84/69/6295  . Cardiomyopathy, dilated ([place]) 10/19/2016  . Acute on chronic systolic heart failure, NYHA class 3 ([place]) 10/19/2016  . Moderate to severe pulmonary hypertension ([place]) 10/19/2016  . Severe tricuspid regurgitation by prior echocardiogram 10/19/2016  . Diabetes mellitus type 2 in obese ([place]) 10/19/2016  . HLD (hyperlipidemia) 10/19/2016  . Seizure ([place]) 10/19/2016  . Fluid overload 10/14/2016  . 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 Cardiorenal syndrome with renal failure 09/28/2016  . Acute urinary retention 09/26/2016  . Acute on chronic combined systolic and diastolic CHF (congestive heart failure) ([place]) 09/26/2016  . Scrotal edema 09/24/2016  . Multiple open wounds of lower leg, initial  encounter 09/24/2016  . Tinea of groin 09/24/2016  . Chronic kidney disease (CKD), stage III (moderate) ([place]) 06/07/2016  . Chronic combined systolic and diastolic congestive heart failure ([place])   . Protein calorie malnutrition ([place]) 07/12/2015  . Congestive dilated cardiomyopathy ([place]) 07/11/2015  . Pressure ulcer 07/09/2015  . Open knee wound 07/06/2015  . History of right below knee amputation ([place]) 02/22/2015  . PAD (peripheral artery disease) ([place]) 02/22/2015  . Blindness of left eye 01/04/2015  . Complications, amputation stump late ([place]) 08/06/2014  . Diabetic neuropathy associated with type 2 diabetes mellitus ([place]) 12/01/2013  . Noncompliance with medications 07/13/2010  . OTHER SPEC TYPES SCHIZOPHRENIA UNSPEC CONDITION 05/24/2009  . Obesity, unspecified 06/09/2007  . DM (diabetes mellitus), type 2, uncontrolled ([place]) 05/02/2006  . Essential hypertension 05/02/2006    Orientation RESPIRATION BLADDER Height & Weight     Self, Situation, Time, Place  Normal Continent, Indwelling catheter Weight: 106.5 kg (234 lb 12.6 oz) Height:  5\' 10"  (177.8 cm)  BEHAVIORAL SYMPTOMS/MOOD NEUROLOGICAL BOWEL NUTRITION STATUS      Continent Diet(Please see DC Summary)  AMBULATORY STATUS COMMUNICATION OF NEEDS Skin   Extensive Assist Verbally PU Stage and Appropriate Care(Stage II on knee, toe, and leg;Stage I on arm)                       Personal Care Assistance Level of Assistance  Bathing, Feeding, Dressing Bathing Assistance: Maximum assistance Feeding assistance: Limited assistance Dressing Assistance: Limited assistance     Functional Limitations Info             SPECIAL  CARE FACTORS FREQUENCY                       Contractures      Additional Factors Info  Code Status, Allergies, Insulin Sliding Scale Code Status Info: Full Allergies Info: NKA   Insulin Sliding Scale Info: 3x daily       Current Medications (02/04/2017):  This is the current hospital  active medication list Current Facility-Administered Medications  Medication Dose Route Frequency Provider Last Rate Last Dose  . 0.9 %  sodium chloride infusion   Intravenous Continuous Erick Colace, NP 10 mL/hr at 02/02/17 0049    . acetaminophen (TYLENOL) tablet 650 mg  650 mg Oral Q6H PRN Lind Covert, MD   650 mg at 02/04/17 0902  . aspirin tablet 325 mg  325 mg Oral Daily Smiley Houseman, MD   325 mg at 02/04/17 0815  . atorvastatin (LIPITOR) tablet 40 mg  40 mg Oral QHS Smiley Houseman, MD   40 mg at 02/03/17 2143  . bisoprolol (ZEBETA) tablet 5 mg  5 mg Oral BID Smiley Houseman, MD   5 mg at 02/04/17 0829  . brimonidine (ALPHAGAN) 0.2 % ophthalmic solution 1 drop  1 drop Left Eye BID Smiley Houseman, MD   1 drop at 02/04/17 0825  . cephALEXin (KEFLEX) capsule 500 mg  500 mg Oral Q6H Rumball, Alison, DO   500 mg at 02/04/17 0514  . chlorhexidine (PERIDEX) 0.12 % solution 15 mL  15 mL Mouth Rinse BID Mannam, Praveen, MD   15 mL at 02/04/17 0827  . dorzolamide-timolol (COSOPT) 22.3-6.8 MG/ML ophthalmic solution 1 drop  1 drop Left Eye BID Smiley Houseman, MD   1 drop at 02/04/17 0827  . enoxaparin (LOVENOX) injection 40 mg  40 mg Subcutaneous Q24H Smiley Houseman, MD   40 mg at 02/03/17 2143  . feeding supplement (ENSURE ENLIVE) (ENSURE ENLIVE) liquid 237 mL  237 mL Oral BID BM Mannam, Praveen, MD   237 mL at 02/04/17 0904  . feeding supplement (PRO-STAT SUGAR FREE 64) liquid 30 mL  30 mL Oral TID Mannam, Praveen, MD   30 mL at 02/04/17 0815  . furosemide (LASIX) injection 80 mg  80 mg Intravenous Daily Tonette Bihari, MD   80 mg at 02/04/17 0814  . insulin aspart (novoLOG) injection 0-15 Units  0-15 Units Subcutaneous TID WC Smiley Houseman, MD   3 Units at 02/03/17 1800  . levETIRAcetam (KEPPRA) tablet 750 mg  750 mg Oral BID Lucila Maine C, DO   750 mg at 02/04/17 0815  . MEDLINE mouth rinse  15 mL Mouth Rinse q12n4p Mannam, Praveen,  MD   15 mL at 02/03/17 1802  . multivitamin liquid 15 mL  15 mL Oral Daily Mannam, Praveen, MD   15 mL at 02/04/17 0828  . polyethylene glycol (MIRALAX / GLYCOLAX) packet 17 g  17 g Oral Daily Riccio, Angela C, DO   17 g at 02/03/17 1801  . predniSONE (DELTASONE) tablet 30 mg  30 mg Oral Q breakfast Tonette Bihari, MD   30 mg at 02/04/17 0814  . sacubitril-valsartan (ENTRESTO) 49-51 mg per tablet  1 tablet Oral BID Smiley Houseman, MD   1 tablet at 02/04/17 0829  . senna (SENOKOT) tablet 8.6 mg  1 tablet Oral Daily Lucila Maine C, DO   8.6 mg at 02/04/17 0814  . tamsulosin (FLOMAX) capsule 0.8 mg  0.8  mg Oral QPC supper Smiley Houseman, MD   0.8 mg at 02/03/17 1801     Discharge Medications: Please see discharge summary for a list of discharge medications.  Relevant Imaging Results:  Relevant Lab Results:   Additional Information SS#: [id number]  Benard Halsted, LCSWA

## 2017-02-04 NOTE — Evaluation (Signed)
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  CT head = multiple small chronic infarcts, chronic ischemic changes, parenchymal volume loss; too agitated for MRI; BiPAP for hypercarbaric respiratory failure, now improved. DDx cellulitis, seizure.  BP remains elevated   Clinical Impression   This 61 y/o M presents with the above. Pt is w/c bound at baseline, most recently receiving assist for ADL completion, was receiving therapy services at SNF. Pt required MaxA +2 for bed mobility this session; maintaining static sitting EOB with overall MinA for grooming ADLs. Currently requires Max-total assist for LB ADLs. Pt will benefit from continued OT services and recommend continuation of OT services after return to SNF setting to maximize pt's safety and independence with ADLs and mobility.     Follow Up Recommendations  SNF;Supervision/Assistance - 24 hour    Equipment Recommendations  Other (comment)(defer to next venue )           Precautions / Restrictions Precautions Precautions: Fall Precaution Comments: Pt is blind  Restrictions Weight Bearing Restrictions: No      Mobility Bed Mobility Overal bed mobility: Needs Assistance Bed Mobility: Rolling;Supine to Sit;Sit to Supine Rolling: Mod assist;Max assist;+2 for physical assistance;+2 for safety/equipment   Supine to sit: Max assist;+2 for physical assistance;+2 for safety/equipment Sit to supine: Max assist;+2 for physical assistance;+2 for safety/equipment   General bed mobility comments: Pt able to roll to R with ModA +2, increased assist required for rolling to L (MaxA +2); Pt requires assist for  advancing LLE towards EOB and to bring trunk into upright sitting position; assist to guide trunk and for LLE management when returning to supine   Transfers                 General transfer comment:      Balance Overall balance assessment: Needs assistance Sitting-balance support: Feet supported;Single extremity supported Sitting balance-Leahy Scale: Fair Sitting balance - Comments: Pt able to maintain static sitting balance briefly, demonstrates periods of posterior lean requiring MinA and verbal cues to correct Postural control: Posterior lean                                 ADL either performed or assessed with clinical judgement   ADL Overall ADL's : Needs assistance/impaired Eating/Feeding: Minimal assistance;Bed level Eating/Feeding Details (indicate cue type and reason): Pt with setup assist for drinking; increased time to bring cup to mouth and to correctly place straw into mouth (undershoots initially)  Grooming: Wash/dry face;Minimal assistance;Sitting Grooming Details (indicate cue type and reason): setup assist for task completion with close MinGuard-MinA for steadying assist while sitting EOB  Upper Body Bathing: Minimal assistance;Sitting   Lower Body Bathing: Maximal assistance;+2 for physical assistance;+2 for safety/equipment;Bed level   Upper Body Dressing : Minimal assistance;Sitting   Lower Body Dressing: Maximal assistance;+2 for physical assistance;+2 for safety/equipment;Bed level       Toileting- Clothing Manipulation and Hygiene: Total assistance;+2 for physical assistance;+2 for safety/equipment;Bed level Toileting - Clothing Manipulation Details (indicate cue type and reason): currently has foley      Functional mobility during ADLs: Maximal assistance;+2 for physical assistance;+2 for safety/equipment(for  supine to sitting EOB ) General ADL Comments: Pt completed bed mobility and grooming ADLs seated EOB, intermittent assist  required to maintain static sitting as Pt demonstrates intermittent posterior leans, able to correct with MinA and verbal cues; Pt sat EOB aprpox 10 min during session      Vision Baseline Vision/History: Legally blind Additional Comments: Pt legaglly blind at baseline; reports he is able to see shadows/figures                Pertinent Vitals/Pain Pain Assessment: No/denies pain     Hand Dominance Right   Extremity/Trunk Assessment Upper Extremity Assessment Upper Extremity Assessment: Generalized weakness   Lower Extremity Assessment Lower Extremity Assessment: Defer to PT evaluation       Communication Communication Communication: Expressive difficulties   Cognition Arousal/Alertness: Awake/alert(falls asleep easily, but easy to arouse) Behavior During Therapy: WFL for tasks assessed/performed Overall Cognitive Status: No family/caregiver present to determine baseline cognitive functioning                                 General Comments: appears to have some memory issues/confusion, but does seem to understand and mostly appropriately answer questions; mildly slow to respond   General Comments  edema noted in R hand/UE                Home Living Family/patient expects to be discharged to:: Skilled nursing facility                                        Prior Functioning/Environment Level of Independence: Needs assistance  Gait / Transfers Assistance Needed: Pt uses WC, states he self-propels, was working on slideboard transfers at [facility], has prosthesis but can't wear due to LE edema ADL's / Homemaking Assistance Needed: Pt reports he was receiving assist from staff at facility for bathing/dressing ADLs            OT Problem List: Decreased strength;Impaired balance (sitting and/or standing);Decreased cognition;Decreased activity tolerance      OT Treatment/Interventions: Self-care/ADL training;DME and/or AE  instruction;Therapeutic activities;Balance training;Therapeutic exercise;Energy conservation;Patient/family education    OT Goals(Current goals can be found in the care plan section) Acute Rehab OT Goals Patient Stated Goal: regain strength  OT Goal Formulation: With patient Time For Goal Achievement: 02/18/17 Potential to Achieve Goals: Fair  OT Frequency: Min 2X/week                  AM-PAC PT "6 Clicks" Daily Activity     Outcome Measure Help from another person eating meals?: A Little Help from another person taking care of personal grooming?: A Little Help from another person toileting, which includes using toliet, bedpan, or urinal?: Total Help from another person bathing (including washing, rinsing, drying)?: A Lot Help from another person to put on and taking off regular upper body clothing?: A Lot Help from another person to put on and taking off regular lower body clothing?: Total 6 Click Score: 12   End of Session Equipment Utilized During Treatment: Oxygen Nurse Communication: Mobility status  Activity Tolerance: Patient tolerated treatment well;Patient limited by fatigue Patient left: in bed;with call bell/phone within reach;with bed alarm set  OT Visit Diagnosis: Muscle weakness (generalized) (M62.81)                Time: 6967-8938 OT Time Calculation (  min): 36 min Charges:  OT General Charges $OT Visit: 1 Visit OT Evaluation $OT Eval Moderate Complexity: 1 Mod OT Treatments $Self Care/Home Management : 8-22 mins G-Codes:     Lou Cal, OT Pager [phone] 02/04/2017   Raymondo Band 02/04/2017, 12:19 PM

## 2017-02-04 NOTE — Progress Notes (Signed)
  Echocardiogram 2D Echocardiogram has been performed.  [name] 02/04/2017, 3:41 PM

## 2017-02-04 NOTE — Progress Notes (Signed)
Notified MD of Pt increased drowsiness/confusion.  Frequent calling out and C/O hallucinations.  Oxygen decreased to 88% when sleeping. O2 @ 2L applied. Pt answers questions appropriately and is oriented x 4.  Possible delirium?  MD in room to assess Pt. No new orders at this time.

## 2017-02-04 NOTE — Progress Notes (Signed)
Family Medicine Teaching Service Daily Progress Note Intern Pager: [phone]  Patient name: [name] Medical record number: [id number] Date of birth: [birth year] Age: 61 y.o. Gender: male  Primary Care Provider: System, Pcp Not In Consultants: CCM Code Status: full   Pt Overview and Major Events to Date:  11/28 - admitted for AMS, slurred speech 11/29 - transferred to ICU for worsening mental and respiratory status, placed on BiPAP 12/1 - transfer back to FM service  Assessment and Plan: Jake Brandstetter [name] a 61 y.o.malepresenting with worsening slurred speech for the past 2 weeks with right arm swelling. 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  AMS/Slurred speech, resolved likely due to Respiratory failure:  Patient at [facility], has history of "not acting right" per nursing home. Patient endorsed two weeks of worsening "slurred speech" on admission. CT Head showed multiple small chronic infarcts, chronic ischemic changes, and parenchymal volume loss of the brain, MRI without acute changes.  Patient became increasingly somnolent with reported apneic episodes. CCM consulted and transferred to ICU 11/29. ABG indicative of hypercarbic respiratory failure and placed on BiPAP. Patient did well on BiPAP with improved mental and respiratory status and became stable for transfer back to the floor 12/1. Given hx of seizures cannot rule out possible seizure - continue prednisone 30mg  - continue home Keppra - keppra levelpending  - CPAP at night - thought no documented OSA - will consult pulmonology about this tomorrow to see if he can go home with CPAP. Per chart review, patient refusing CPAP at night  - PT/OT eval, PT recs of SNF  RUE Cellulitis Presented with RUE erythema concerning for cellulitis. No pain, no significant erythema, significant swelling. Blood cultures NGTD. Hypothermia  resolved with bear hugger. Previously on Vanc/Zosyn (11/28 -12/1). RUE ultrasound negative for blood clot  - If continues to worsening, could consider CT chest for possibly thoracic outlet syndrome  - Continue Keflex (12/1-)  Hypothermia-resolved Temperature 97.9 today. TSH wnl. Cortisol low, suspected to have some degree of adrenal insufficiency.  -continue prednison 30mg    HFrEF Exacerbation Fluid overload likely combination of heart failure and low albumin. Hypervolemic on exam - Anasarca. Significant scrotal/penile edema - non-tender. Weight increasing from admission from 234 > 244 lbs today, though unsure if accurate. 1.4 Liters output yesterday. His last ECHO in 09/2016 showed EF 20-25%, diffuse LV hypokinesis, and elevated pulmonary artery pressure to 19mmHg. BNP is elevated to 3205 but down from last admission (3536). S/p IV Lasix 20mg  in ICU 11/30. Initial transitioned to home lasix on 12/1,concern for worsening edema yesterday - 80 IV lasix x 1 yesterday  - Continue home meds Bisoprolol, Entresto - Continue IV lasix 80 mg daily  - consider cardiology consult tomorrow if no improvement  - Strict I's/O's - Daily weights - will repeat Echo - repeat EKG. Last EKG showing low voltage, possible pericardial effusion  - will consult HF pending echo results   HTN Slight hypertensive, starting on aggressive diuresis so will continue to watch  - Continue home meds Bisoprolol 5mg  BID and Entresto BID - Lasix 80 IV   Hypoalbunemia  Likely due to malnutrition. UA with protein, will check 24 hour urine protein - to make sure kidney's not spill significant amount of protein  - 24 hour urine albumin  - Will consult nutrition   HLD Patient has history of hyperlipidemia well controlled on statin. - Continue home Atorvastatin 40mg  daily  T2DM Patient takes metformin 500mg  BID. A1c is 8.0. CBG of 196 on evening of 02/03/17.  - CBGs at bedtime and before meals - sSSI and monitor  CBGs.  Elevated Alkaline Phosphatase chronic. No prior GGT on file, - continues to be elevated at 187 on 02/04/17  Seizure Disorder Patient continues to have no symptoms of seizure. EEG obtained due to AMS, no seizure activity identified, indicative of encephalopathy. - Continue Levetiracetam 750mg  BID - keppra level  Urinary Retention Patient is taking flomax at nursing home to help with urinary retention. - Continue Flomax 0.8mg  daily  - Urine catheter in place   FEN/GI:Dysphagia 2 Prophylaxis:Lovenox  Disposition: likely SNF   Subjective:  Patient states he feels "awful", stating he is very bloated and is having "bad" vision. Patient states vision is more blurry than normal. Patient states his RUE is better and is less swollen. Patient denies pain but states bloating. Patient's nurse stating that he keeps calling out but is in NAD when she arrives. Reports one account where he stated he was fallen out of bed but when she arrived he was in bed. Patient's nurse also noted that patient is hallucinating. Patient does not having auditory hallucinations, stating there are multiple people talking to each other but denies any talk of harming self or others. Patient's nurse also noted drainage from foley insertion site. States drainage is yellow.   Objective: Temp:  [97.5 F (36.4 C)-98.7 F (37.1 C)] (P) 97.8 F (36.6 C) (12/03 1238) Pulse Rate:  [35-72] 35 (12/03 1315) Resp:  [11-19] 18 (12/03 1315) BP: (139-164)/(101-114) 143/103 (12/03 1315) SpO2:  [87 %-98 %] 95 % (12/03 1315) Weight:  [234 lb 12.6 oz (106.5 kg)] 234 lb 12.6 oz (106.5 kg) (12/03 0745)  I/O last 3 completed shifts: In: 6759 [P.O.:1194] Out: 31 [Urine:4825] Total I/O In: 340 [P.O.:340] Out: 1700 [Urine:1700]  Physical Exam: General: awake and alert, laying in bed  Cardiovascular: RRR, no MRG Respiratory: CTAB, no wheezes, rales, or rhonchi  Abdomen: soft, bowel sounds normal, slight  distension Extremities: right BKA, wounds dressed, trace pitting edema in lower extremities. Non pitting edema in RUE, decreased erythema, no warmth, no extension beyond margins GU: scrotal edema, no swelling or erythema around foley insertion, foreskin easily retractable, foley draining appropriately   Laboratory: Recent Labs  Lab 02/02/17 1654 02/03/17 0358 02/04/17 0532  WBC 8.2 7.8 7.2  HGB 13.7 13.4 14.0  HCT 42.3 41.6 43.8  PLT 233 244 248   Recent Labs  Lab 02/01/17 0532 02/02/17 1654 02/03/17 0358 02/04/17 0532  NA 140 135 139 140  K 3.9 4.0 3.8 3.7  CL 109 102 103 102  CO2 25 27 27 31   BUN 21* 26* 29* 31*  CREATININE 0.93 1.01 1.12 1.12  CALCIUM 8.6* 8.5* 8.7* 8.8*  PROT 5.4*  --  5.4* 5.7*  BILITOT 1.2  --  1.0 0.9  ALKPHOS 179*  --  173* 187*  ALT 16*  --  25 33  AST 17  --  28 34  GLUCOSE 150* 167* 143* 123*     Imaging/Diagnostic Tests: Dg Chest 2 View  Result Date: 01/30/2017 CLINICAL DATA:  Altered level of consciousness. EXAM: CHEST  2 VIEW COMPARISON:  12/20/2016 FINDINGS: Artifact overlies chest. There is cardiomegaly. Mediastinal shadows are otherwise normal. The lungs appear clear. Cannot rule out some left lower lobe volume loss. Question small dependent effusions. Upper lungs appear clear. IMPRESSION: Question small dependent effusions and some left lower lobe volume loss.  Cardiomegaly. Electronically Signed   By: Nelson Chimes M.D.   On: 01/30/2017 13:48   Ct Head Wo Contrast  Result Date: 01/30/2017 CLINICAL DATA:  61 y/o  M; altered mental status. EXAM: CT HEAD WITHOUT CONTRAST TECHNIQUE: Contiguous axial images were obtained from the base of the skull through the vertex without intravenous contrast. COMPARISON:  10/19/2016 MRI of the head. FINDINGS: Brain: No evidence of acute infarction, hemorrhage, hydrocephalus, extra-axial collection or mass lesion/mass effect. Small chronic infarcts in bilateral anterolateral frontal lobes, right  posterolateral frontal lobe, left parietal lobe, and left cerebellar hemisphere. Stable chronic microvascular ischemic changes of white matter and parenchymal volume loss of the brain. Vascular: Calcific atherosclerosis of the carotid siphons. Skull: Normal. Negative for fracture or focal lesion. Sinuses/Orbits: No acute finding. Increased attenuation of right globe may represent vitreous hemorrhage or silicon tamponade. Other: None. IMPRESSION: 1. No acute intracranial abnormality identified. 2. Multiple small chronic infarcts are stable. Stable chronic microvascular ischemic changes and parenchymal volume loss of the brain. 3. Increased density of right globe may represent vitreous hemorrhage of silicon tamponade, clinical correlation recommended. Electronically Signed   By: Kristine Garbe M.D.   On: 01/30/2017 13:38   Mr Brain Wo Contrast  Result Date: 02/01/2017 CLINICAL DATA:  Worsened slurred speech over the last 2 weeks. Right arm cellulitis. EXAM: MRI HEAD WITHOUT CONTRAST TECHNIQUE: Multiplanar, multiecho pulse sequences of the brain and surrounding structures were obtained without intravenous contrast. COMPARISON:  CT 01/30/2017.  MRI 10/19/2016. FINDINGS: Brain: Diffusion imaging does not show any acute or subacute infarction. The brainstem shows T2 signal throughout the pons which appears the same as was seen in August of this year. Findings probably represent chronic small-vessel change, but there is no volume loss or focal lacunar injury. Few old small vessel cerebellar infarctions. 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 These appear the same as were seen previously. No evidence of recent infarction, mass lesion, acute hemorrhage, hydrocephalus or extra-axial collection. Vascular: Major  vessels at the base of the brain show flow. Skull and upper cervical spine: Negative Sinuses/Orbits: Sinuses are clear. There are bilateral mastoid effusions. Markedly abnormal appearance of the right globe, possibly secondary to ocular hemorrhage or treatment for detached retina. This was not present on the previous MRI. Other: None IMPRESSION: No acute intracranial finding. Chronic small-vessel ischemic changes of the pons and cerebellum. 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 No new finding. No abnormal appearance to the right globe consistent with hemorrhage or treatment for detached retina. Electronically Signed   By: Nelson Chimes M.D.   On: 02/01/2017 07:05   Ir Abdomen US Limited  Result Date: 01/22/2017 CLINICAL DATA:  Ascites and possible paracentesis. EXAM: LIMITED ABDOMEN ULTRASOUND FOR ASCITES TECHNIQUE: Limited ultrasound survey for ascites was performed in all four abdominal quadrants. COMPARISON:  None. FINDINGS: Survey of the peritoneal cavity by ultrasound demonstrates a relatively small volume of intraperitoneal ascites. Paracentesis was not performed today. IMPRESSION: Small volume of ascites in the peritoneal cavity. Paracentesis procedure was not performed today. Electronically Signed   By: Aletta Edouard M.D.   On: 01/22/2017 11:26    Caroline More, DO 02/04/2017, 1:49 PM PGY-1, [place] Intern pager: [phone], text pages welcome

## 2017-02-04 NOTE — Progress Notes (Signed)
Provider aware at bedside

## 2017-02-04 NOTE — Progress Notes (Signed)
Pt refused novolog injection stating "you already gave me a shot, I don't need that one". Attempted to educate Pt on difference in lovenox and novolog injection and reason for each.  Pt continues to refuse. MD notified.

## 2017-02-04 NOTE — Progress Notes (Signed)
SLP Cancellation Note  Patient Details Name: [name] MRN: [id number] DOB: 1955-12-16   Cancelled treatment:       Reason Eval/Treat Not Completed: Fatigue/lethargy limiting ability to participate - RN indicates pt tolerating Dys 2/thin diet, but has difficulty with coordination of self-feeding. Pt therefore not likely to be ready to advance solid consistencies. RN requested hold on seeing pt at this time. Will continue efforts.  Celia B. Quentin O[facility], CCC-SLP Speech Language Pathologist [phone]  [name] 02/04/2017, 1:00 PM

## 2017-02-04 NOTE — Progress Notes (Signed)
FPTS Interim Progress Note  S: Received page from nurse that patient having cardiac changes on monitor. Patient at that time resting comfortably with no cardiac complaints. Ordered an EKG which showed premature supraventricular complexes with inconsistent p-waves.   O: BP (!) 143/103   Pulse 65   Temp 97.8 F (36.6 C) (Oral)   Resp 18   Ht 5\' 10"  (1.778 m)   Wt 234 lb 12.6 oz (106.5 kg)   SpO2 95%   BMI 33.69 kg/m     A/P: EKG changes -have consulted cardiology, appreciate recommendations -continue to monitor for cardiac symptoms -echo pending  Caroline More, DO 02/04/2017, 2:27 PM PGY-1, [place] Medicine Service pager [phone]

## 2017-02-05 ENCOUNTER — Inpatient Hospital Stay (HOSPITAL_COMMUNITY): Payer: Medicaid Other

## 2017-02-05 DIAGNOSIS — I5023 Acute on chronic systolic (congestive) heart failure: Secondary | ICD-10-CM

## 2017-02-05 LAB — BLOOD GAS, ARTERIAL
Acid-Base Excess: 11.1 mmol/L — ABNORMAL HIGH (ref 0.0–2.0)
Bicarbonate: 35.7 mmol/L — ABNORMAL HIGH (ref 20.0–28.0)
Drawn by: 358491
FIO2: 24
O2 Saturation: 91.4 %
Patient temperature: 98.6
pCO2 arterial: 53 mmHg — ABNORMAL HIGH (ref 32.0–48.0)
pH, Arterial: 7.444 (ref 7.350–7.450)
pO2, Arterial: 63.7 mmHg — ABNORMAL LOW (ref 83.0–108.0)

## 2017-02-05 LAB — COMPREHENSIVE METABOLIC PANEL
ALT: 39 U/L (ref 17–63)
AST: 33 U/L (ref 15–41)
Albumin: 2 g/dL — ABNORMAL LOW (ref 3.5–5.0)
Alkaline Phosphatase: 177 U/L — ABNORMAL HIGH (ref 38–126)
Anion gap: 8 (ref 5–15)
BUN: 32 mg/dL — ABNORMAL HIGH (ref 6–20)
CO2: 32 mmol/L (ref 22–32)
Calcium: 8.5 mg/dL — ABNORMAL LOW (ref 8.9–10.3)
Chloride: 99 mmol/L — ABNORMAL LOW (ref 101–111)
Creatinine, Ser: 0.92 mg/dL (ref 0.61–1.24)
GFR calc Af Amer: >60 mL/min (ref >60)
GFR calc non Af Amer: >60 mL/min (ref >60)
Glucose, Bld: 157 mg/dL — ABNORMAL HIGH (ref 65–99)
Potassium: 3.8 mmol/L (ref 3.5–5.1)
Sodium: 139 mmol/L (ref 135–145)
Total Bilirubin: 1.1 mg/dL (ref 0.3–1.2)
Total Protein: 5.1 g/dL — ABNORMAL LOW (ref 6.5–8.1)

## 2017-02-05 LAB — CBC
HCT: 43.4 % (ref 39.0–52.0)
Hemoglobin: 14.5 g/dL (ref 13.0–17.0)
MCH: 26.9 pg (ref 26.0–34.0)
MCHC: 33.4 g/dL (ref 30.0–36.0)
MCV: 80.5 fL (ref 78.0–100.0)
Platelets: 227 10*3/uL (ref 150–400)
RBC: 5.39 MIL/uL (ref 4.22–5.81)
RDW: 18 % — ABNORMAL HIGH (ref 11.5–15.5)
WBC: 7.5 10*3/uL (ref 4.0–10.5)

## 2017-02-05 LAB — GLUCOSE, CAPILLARY
Glucose-Capillary: 168 mg/dL — ABNORMAL HIGH (ref 65–99)
Glucose-Capillary: 164 mg/dL — ABNORMAL HIGH (ref 65–99)
Glucose-Capillary: 260 mg/dL — ABNORMAL HIGH (ref 65–99)
Glucose-Capillary: 447 mg/dL — ABNORMAL HIGH (ref 65–99)

## 2017-02-05 MED ORDER — ALUM & MAG HYDROXIDE-SIMETH 200-200-20 MG/5ML PO SUSP
15.0000 mL | Freq: Once | ORAL | Status: AC
  Administered 2017-02-05: 15 mL via ORAL
  Filled 2017-02-05: qty 30

## 2017-02-05 MED ORDER — INSULIN ASPART 100 UNIT/ML ~~LOC~~ SOLN
5.0000 [IU] | Freq: Once | SUBCUTANEOUS | Status: AC
  Administered 2017-02-05: 5 [IU] via SUBCUTANEOUS

## 2017-02-05 MED ORDER — MILK AND MOLASSES ENEMA
1.0000 | Freq: Once | RECTAL | Status: DC | PRN
  Filled 2017-02-05: qty 250

## 2017-02-05 MED ORDER — FUROSEMIDE 10 MG/ML IJ SOLN
80.0000 mg | Freq: Two times a day (BID) | INTRAMUSCULAR | Status: DC
  Administered 2017-02-05 – 2017-02-06 (×2): 80 mg via INTRAVENOUS
  Filled 2017-02-05 (×2): qty 8

## 2017-02-05 NOTE — Consult Note (Signed)
[name] Renal Tucker Note  Requesting MD: [name]: proteinuria  HPI:  [name] is a 61 y.o. male nursing home resident. [place])  With  past medical history significant for type 2 diabetes mellitus, hypertension, hyperlipidemia, CHF with recent 15-20 28  %, PAD status post right BKA and wounds on left lower extremity.  He was admitted on 11/28 with mental status changes.  Imaging showed-chronic ischemic changes, sz was suspected. He was also noted to be hypothermic and had diffuse swelling as well- low albumin due to nutrition.  He had transient hypercarbia better with bipap.  Been treated with diuretics lasix 80 iv q 12- weight is variable but does seem to be trending down last 3 days.  I guess due to low albumin and U/A showing 100 of protein , they did 24 hour urine collection with total protein of 983 and consulted renal - he is on Entresto. Creatinine is 0.92  Creat  Date/Time Value Ref Range Status  07/06/2015 10:56 AM 1.03 0.70 - 1.33 mg/dL Final  01/03/2015 05:00 PM 0.90 0.70 - 1.33 mg/dL Final  05/15/2013 02:32 PM 0.98 0.50 - 1.35 mg/dL Final  02/15/2011 03:12 PM 0.93 0.50 - 1.35 mg/dL Final   Creatinine, Ser  Date/Time Value Ref Range Status  02/05/2017 04:07 AM 0.92 0.61 - 1.24 mg/dL Final  02/04/2017 05:32 AM 1.12 0.61 - 1.24 mg/dL Final  02/03/2017 03:58 AM 1.12 0.61 - 1.24 mg/dL Final  02/02/2017 04:54 PM 1.01 0.61 - 1.24 mg/dL Final  02/01/2017 05:32 AM 0.93 0.61 - 1.24 mg/dL Final  01/31/2017 04:38 AM 0.88 0.61 - 1.24 mg/dL Final  01/30/2017 08:37 PM 0.89 0.61 - 1.24 mg/dL Final  01/30/2017 01:10 PM 0.93 0.61 - 1.24 mg/dL Final  12/31/2016 05:35 AM 1.12 0.61 - 1.24 mg/dL Final  12/30/2016 04:26 AM 1.12 0.61 - 1.24 mg/dL Final  12/29/2016 06:10 AM 1.07 0.61 - 1.24 mg/dL Final  12/28/2016 04:57 AM 0.98 0.61 - 1.24 mg/dL Final  12/27/2016 02:31 AM 0.93 0.61 - 1.24 mg/dL Final  12/26/2016 05:29 AM 0.99 0.61 -  1.24 mg/dL Final  12/25/2016 05:18 AM 1.09 0.61 - 1.24 mg/dL Final  12/24/2016 06:29 AM 1.17 0.61 - 1.24 mg/dL Final  12/23/2016 06:55 AM 1.11 0.61 - 1.24 mg/dL Final  12/22/2016 06:07 AM 1.21 0.61 - 1.24 mg/dL Final  12/21/2016 03:42 AM 1.25 (H) 0.61 - 1.24 mg/dL Final  12/20/2016 03:50 PM 1.14 0.61 - 1.24 mg/dL Final  10/25/2016 04:04 PM 1.04 0.76 - 1.27 mg/dL Final  10/20/2016 09:51 AM 1.26 (H) 0.61 - 1.24 mg/dL Final  10/19/2016 11:03 AM 1.34 (H) 0.61 - 1.24 mg/dL Final  10/19/2016 10:43 AM 1.40 (H) 0.61 - 1.24 mg/dL Final  10/18/2016 04:36 AM 1.28 (H) 0.61 - 1.24 mg/dL Final  10/17/2016 03:43 AM 1.34 (H) 0.61 - 1.24 mg/dL Final  10/16/2016 02:30 AM 1.29 (H) 0.61 - 1.24 mg/dL Final  10/15/2016 03:36 AM 1.38 (H) 0.61 - 1.24 mg/dL Final  10/14/2016 05:56 AM 1.38 (H) 0.61 - 1.24 mg/dL Final  09/30/2016 04:30 AM 1.12 0.61 - 1.24 mg/dL Final  09/29/2016 04:30 AM 1.04 0.61 - 1.24 mg/dL Final  09/28/2016 04:03 AM 1.12 0.61 - 1.24 mg/dL Final  09/27/2016 04:29 AM 1.17 0.61 - 1.24 mg/dL Final  09/26/2016 03:58 AM 1.29 (H) 0.61 - 1.24 mg/dL Final  09/25/2016 03:54 AM 1.28 (H) 0.61 - 1.24 mg/dL Final  09/24/2016 04:22 PM 1.24 0.61 - 1.24 mg/dL Final  09/24/2016 11:23 AM 1.09  0.61 - 1.24 mg/dL Final  08/10/2016 10:00 AM 1.27 (H) 0.61 - 1.24 mg/dL Final  05/24/2016 10:34 AM 1.44 (H) 0.61 - 1.24 mg/dL Final  05/08/2016 10:46 AM 1.43 (H) 0.61 - 1.24 mg/dL Final  12/16/2015 11:55 AM 1.27 (H) 0.61 - 1.24 mg/dL Final  12/06/2015 02:36 PM 1.27 (H) 0.61 - 1.24 mg/dL Final  10/19/2015 02:49 PM 1.15 0.61 - 1.24 mg/dL Final  10/10/2015 03:27 AM 1.06 0.61 - 1.24 mg/dL Final  10/09/2015 03:59 AM 1.12 0.61 - 1.24 mg/dL Final  10/08/2015 03:35 AM 1.19 0.61 - 1.24 mg/dL Final  10/07/2015 03:55 AM 1.11 0.61 - 1.24 mg/dL Final  10/06/2015 03:08 PM 1.11 0.61 - 1.24 mg/dL Final  09/19/2015 02:15 PM 1.06 0.61 - 1.24 mg/dL Final  09/01/2015 03:43 PM 0.97 0.61 - 1.24 mg/dL Final  07/20/2015 04:54 AM 1.44 (H)  0.61 - 1.24 mg/dL Final  07/19/2015 04:44 AM 1.16 0.61 - 1.24 mg/dL Final     PMHx:   Past Medical History:  Diagnosis Date  . Acute on chronic systolic heart failure, NYHA class 3 ([place])   . AKI (acute kidney injury) ([place])   . Anemia   . BKA stump complication ([place])   . Blind left eye   . Blind left eye   . Cardiomyopathy, dilated ([place])   . CHF (congestive heart failure) ([place])   . Combined systolic and diastolic congestive heart failure ([place])   . Congestive dilated cardiomyopathy ([place]) 07/11/2015  . Diabetes mellitus   . Diabetic neuropathy associated with type 2 diabetes mellitus ([place])   . Hypertension   . Neuropathy   . Noncompliance with medications   . Open knee wound 10/2015   on rt bka  . PAD (peripheral artery disease) ([place])   . Protein calorie malnutrition ([place])   . Shortness of breath dyspnea     Past Surgical History:  Procedure Laterality Date  . AMPUTATION Right 09/26/2013   Procedure: AMPUTATION BELOW KNEE;  Surgeon: Rozanna Box, MD;  Location: [facility];  Service: Orthopedics;  Laterality: Right;  . AMPUTATION Right 05/01/2015   Procedure: REVISION OF RIGHT TRANSTIBIAL AMPUTATION ;  Surgeon: Newt Minion, MD;  Location: [facility];  Service: Orthopedics;  Laterality: Right;  . APPLICATION OF WOUND VAC  08/10/2016   Procedure: APPLICATION OFI NCISONAL  WOUND VAC;  Surgeon: Newt Minion, MD;  Location: [facility];  Service: Orthopedics;;  . CARDIAC CATHETERIZATION N/A 07/13/2015   Procedure: Right/Left Heart Cath and Coronary Angiography;  Surgeon: Jolaine Artist, MD;  Location: [facility];  Service: Cardiovascular;  Laterality: N/A;  . COLONOSCOPY    . I&D EXTREMITY Right 09/24/2013   Procedure: IRRIGATION AND DEBRIDEMENT RIGHT FOOT ULCER;  Surgeon: Renette Butters, MD;  Location: [facility];  Service: Orthopedics;  Laterality: Right;  . I&D EXTREMITY Right 09/26/2013   Procedure: Repeat IRRIGATION AND DEBRIDEMENT Right Foot Ulcer;  Surgeon: Rozanna Box, MD;   Location: [facility];  Service: Orthopedics;  Laterality: Right;  . MULTIPLE TOOTH EXTRACTIONS    . SKIN SPLIT GRAFT Right 08/10/2016   Procedure: SKIN GRAFT SPLIT THICKNESS RIGHT LEG;  Surgeon: Newt Minion, MD;  Location: [facility];  Service: Orthopedics;  Laterality: Right;  . STUMP REVISION Right 04/20/2015   Procedure: Revision Right Below Knee Amputation, Apply Wound VAC;  Surgeon: Newt Minion, MD;  Location: [facility];  Service: Orthopedics;  Laterality: Right;  . TONSILLECTOMY      Family Hx:  Family History  Problem Relation Age of Onset  . Cancer Mother   . Peripheral vascular disease Father     Social History:  reports that he quit smoking about 16 years ago. He quit after 20.00 years of use. He quit smokeless tobacco use about 16 years ago. He reports that he does not drink alcohol or use drugs.  Allergies: No Known Allergies  Medications: Prior to Admission medications   Medication Sig Start Date End Date Taking? Authorizing Provider  Amino Acids-Protein Hydrolys (PRO-STAT) LIQD Take 30 mLs by mouth 2 (two) times daily.   Yes [provider]  aspirin 325 MG tablet Take 1 tablet (325 mg total) by mouth daily. 10/21/16  Yes Sheikh, Omair Latif, DO  atorvastatin (LIPITOR) 40 MG tablet Take 1 tablet (40 mg total) by mouth daily. Patient taking differently: Take 40 mg by mouth at bedtime.  10/19/16  Yes Hosie Poisson, MD  bisoprolol (ZEBETA) 5 MG tablet Take 1 tablet (5 mg total) by mouth daily. Patient taking differently: Take 5 mg by mouth 2 (two) times daily.  10/19/16  Yes Hosie Poisson, MD  brimonidine (ALPHAGAN) 0.2 % ophthalmic solution PLACE 1 DROP INTO THE LEFT EYE 2 (TWO) TIMES DAILY. 06/15/15  Yes Mariel Aloe, MD  cephALEXin (KEFLEX) 500 MG capsule Take 500 mg by mouth 2 (two) times daily. FOR 14 DAYS FOR RIGHT ARM SWELLING 01/25/17 02/07/17 Yes [provider]  dorzolamide-timolol (COSOPT) 22.3-6.8 MG/ML ophthalmic solution Place 1 drop into the left eye 2  (two) times daily. 09/28/13  Yes Bacigalupo, Dionne Bucy, MD  furosemide (LASIX) 40 MG tablet Take 1 tablet (40 mg total) by mouth 2 (two) times daily. 12/31/16  Yes Tammi Klippel, Sherin, DO  levETIRAcetam (KEPPRA) 750 MG tablet Take 1 tablet (750 mg total) by mouth 2 (two) times daily. 10/20/16  Yes Sheikh, Omair Latif, DO  LORazepam (ATIVAN) 0.5 MG tablet Take 0.5 mg by mouth every 6 (six) hours as needed for anxiety. FOR 14 DAYS   Yes [provider]  metFORMIN (GLUCOPHAGE) 500 MG tablet Take 1 tablet (500 mg total) by mouth 2 (two) times daily with a meal. 10/10/15  Yes Clegg, Amy D, NP  Multiple Vitamins-Minerals (DECUBI-VITE) CAPS Take 1 capsule by mouth daily.   Yes [provider]  QUEtiapine (SEROQUEL) 25 MG tablet Take 25 mg by mouth every 12 (twelve) hours.   Yes [provider]  sacubitril-valsartan (ENTRESTO) 49-51 MG Take 1 tablet by mouth 2 (two) times daily. 08/23/16  Yes Arbutus Leas, NP  tamsulosin (FLOMAX) 0.4 MG CAPS capsule Take 2 capsules (0.8 mg total) by mouth daily after supper. 12/31/16  Yes Abraham, Sherin, DO  vitamin A 10000 UNIT capsule Take 10,000 Units by mouth daily.   Yes [provider]  vitamin C (ASCORBIC ACID) 500 MG tablet Take 500 mg by mouth daily.   Yes [provider]  zinc sulfate 220 (50 Zn) MG capsule Take 220 mg by mouth daily.   Yes [provider]  bacitracin ointment Apply topically 2 (two) times daily. Patient not taking: Reported on 01/30/2017 10/18/16   Hosie Poisson, MD    I have reviewed the patient's current medications.  Labs:  Results for orders placed or performed during the hospital encounter of 01/30/17 (from the past 48 hour(s))  Glucose, capillary     Status: Abnormal   Collection Time: 02/03/17  4:27 PM  Result Value Ref Range   Glucose-Capillary 192 (H) 65 - 99 mg/dL  Glucose, capillary  Status: Abnormal   Collection Time: 02/03/17  9:15 PM  Result Value Ref Range   Glucose-Capillary  196 (H) 65 - 99 mg/dL  Comprehensive metabolic panel     Status: Abnormal   Collection Time: 02/04/17  5:32 AM  Result Value Ref Range   Sodium 140 135 - 145 mmol/L   Potassium 3.7 3.5 - 5.1 mmol/L   Chloride 102 101 - 111 mmol/L   CO2 31 22 - 32 mmol/L   Glucose, Bld 123 (H) 65 - 99 mg/dL   BUN 31 (H) 6 - 20 mg/dL   Creatinine, Ser 1.12 0.61 - 1.24 mg/dL   Calcium 8.8 (L) 8.9 - 10.3 mg/dL   Total Protein 5.7 (L) 6.5 - 8.1 g/dL   Albumin 2.2 (L) 3.5 - 5.0 g/dL   AST 34 15 - 41 U/L   ALT 33 17 - 63 U/L   Alkaline Phosphatase 187 (H) 38 - 126 U/L   Total Bilirubin 0.9 0.3 - 1.2 mg/dL   GFR calc non Af Amer >60 >60 mL/min   GFR calc Af Amer >60 >60 mL/min    Comment: (NOTE) The eGFR has been calculated using the CKD EPI equation. This calculation has not been validated in all clinical situations. eGFR's persistently <60 mL/min signify possible Chronic Kidney Disease.    Anion gap 7 5 - 15  CBC     Status: Abnormal   Collection Time: 02/04/17  5:32 AM  Result Value Ref Range   WBC 7.2 4.0 - 10.5 K/uL   RBC 5.45 4.22 - 5.81 MIL/uL   Hemoglobin 14.0 13.0 - 17.0 g/dL   HCT 43.8 39.0 - 52.0 %   MCV 80.4 78.0 - 100.0 fL   MCH 25.7 (L) 26.0 - 34.0 pg   MCHC 32.0 30.0 - 36.0 g/dL   RDW 17.7 (H) 11.5 - 15.5 %   Platelets 248 150 - 400 K/uL  Glucose, capillary     Status: Abnormal   Collection Time: 02/04/17  8:06 AM  Result Value Ref Range   Glucose-Capillary 112 (H) 65 - 99 mg/dL  Glucose, capillary     Status: Abnormal   Collection Time: 02/04/17 12:14 PM  Result Value Ref Range   Glucose-Capillary 200 (H) 65 - 99 mg/dL  Glucose, capillary     Status: Abnormal   Collection Time: 02/04/17  4:45 PM  Result Value Ref Range   Glucose-Capillary 231 (H) 65 - 99 mg/dL   Comment 1 Notify RN   Glucose, capillary     Status: Abnormal   Collection Time: 02/04/17  9:10 PM  Result Value Ref Range   Glucose-Capillary 358 (H) 65 - 99 mg/dL  Comprehensive metabolic panel     Status:  Abnormal   Collection Time: 02/05/17  4:07 AM  Result Value Ref Range   Sodium 139 135 - 145 mmol/L   Potassium 3.8 3.5 - 5.1 mmol/L   Chloride 99 (L) 101 - 111 mmol/L   CO2 32 22 - 32 mmol/L   Glucose, Bld 157 (H) 65 - 99 mg/dL   BUN 32 (H) 6 - 20 mg/dL   Creatinine, Ser 0.92 0.61 - 1.24 mg/dL   Calcium 8.5 (L) 8.9 - 10.3 mg/dL   Total Protein 5.1 (L) 6.5 - 8.1 g/dL   Albumin 2.0 (L) 3.5 - 5.0 g/dL   AST 33 15 - 41 U/L   ALT 39 17 - 63 U/L   Alkaline Phosphatase 177 (H) 38 - 126 U/L   Total  Bilirubin 1.1 0.3 - 1.2 mg/dL   GFR calc non Af Amer >60 >60 mL/min   GFR calc Af Amer >60 >60 mL/min    Comment: (NOTE) The eGFR has been calculated using the CKD EPI equation. This calculation has not been validated in all clinical situations. eGFR's persistently <60 mL/min signify possible Chronic Kidney Disease.    Anion gap 8 5 - 15  CBC     Status: Abnormal   Collection Time: 02/05/17  4:07 AM  Result Value Ref Range   WBC 7.5 4.0 - 10.5 K/uL   RBC 5.39 4.22 - 5.81 MIL/uL   Hemoglobin 14.5 13.0 - 17.0 g/dL   HCT 43.4 39.0 - 52.0 %   MCV 80.5 78.0 - 100.0 fL   MCH 26.9 26.0 - 34.0 pg   MCHC 33.4 30.0 - 36.0 g/dL   RDW 18.0 (H) 11.5 - 15.5 %   Platelets 227 150 - 400 K/uL  Glucose, capillary     Status: Abnormal   Collection Time: 02/05/17  8:05 AM  Result Value Ref Range   Glucose-Capillary 168 (H) 65 - 99 mg/dL  Glucose, capillary     Status: Abnormal   Collection Time: 02/05/17 11:49 AM  Result Value Ref Range   Glucose-Capillary 164 (H) 65 - 99 mg/dL  Blood gas, arterial     Status: Abnormal   Collection Time: 02/05/17 12:32 PM  Result Value Ref Range   FIO2 24.00    Delivery systems NASAL CANNULA    pH, Arterial 7.444 7.350 - 7.450   pCO2 arterial 53.0 (H) 32.0 - 48.0 mmHg   pO2, Arterial 63.7 (L) 83.0 - 108.0 mmHg   Bicarbonate 35.7 (H) 20.0 - 28.0 mmol/L   Acid-Base Excess 11.1 (H) 0.0 - 2.0 mmol/L   O2 Saturation 91.4 %   Patient temperature 98.6    Collection  site LEFT RADIAL    Drawn by [id number]    Sample type ARTERIAL DRAW    Allens test (pass/fail) PASS PASS     ROS:  A comprehensive review of systems was negative except for: Cardiovascular: positive for lower extremity edema and edema throughout  Physical Exam: Vitals:   02/05/17 1130 02/05/17 1149  BP: (!) 144/110   Pulse: 60   Resp: 14   Temp:  98 F (36.7 C)  SpO2: 98%      General: awake, simple, NAD HEENT: PERRLA, EOMI, mucous membranes moist Neck: positive JVD Heart: RRR Lungs: dec BS at bases Abdomen: obese, distended Extremities: pitting edema throughout Skin: warm and dry  Neuro: seems simple, lapses of cognition   Assessment/Plan: 61 year old male with multiple medical problems.  He has low level proteinuria and normal kidney function.  He does have anasarca with low albumin however 1.Renal- a 24-hour urine protein of 950 is not nephrotic range proteinuria and is actually in my  normal range of proteinuria.  He is already on an ARB which is the only thing that I would do to treat this.  His level of proteinuria does not give someone nephrotic syndrome 2. Hypertension/volume  -he is massively volume overload with anasarca and a low albumin.  I believe this is more likely due to poor nutrition and congestive heart failure than anything else.  I agree with management and current dose of Lasix.  He will need a robust dose of lasix at discharge as well to keep him out of trouble- I would say at least 80 PO BID   Thank you for this consult.  I really do not have more to offer as I agree with your management to date.  Call with any questions    GOLDSBOROUGH,KELLIE A 02/05/2017, 2:06 PM

## 2017-02-05 NOTE — Progress Notes (Signed)
  Speech Language Pathology Treatment: Dysphagia  Patient Details Name: [name] MRN: [id number] DOB: 07/03/1955 Today's Date: 02/05/2017 Time: [id number] SLP Time Calculation (min) (ACUTE ONLY): 10 min  Assessment / Plan / Recommendation Clinical Impression  Pt was seen for dysphagia goals.  SLP facilitated the session with a functional snack of dys 3 textures to continue working towards diet progression.  Pt was much more alert today in comparison to previous therapy session; however, he remains confused.  Pt consumed advanced solids with minimal assist for use of swallowing precautions. Mastication is prolonged due to missing upper dentition but pt cleared solids without residuals.  As a result, recommend advancing pt to dys 3 textures with ongoing need for full supervision due to mentation.  Pt was left in bed with bed alarm set and call bell within reach.  Continue per current plan of care.    HPI HPI: [name] a 61 y.o.malepresenting with worsening slurred speech for the past 2 weeks with right arm swelling. 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 CT showed multiple small chronic infarcts, chronic ischemic changes, and parenchymal volume loss of the brain, no acute abnormalities. MRI attempted and unable to complete. Critical care consulted and pt suspected to have cellulitis and acute hypercarbic resp failure exacerbated by benzos and underlying sleep apnea. ST consulted for hopeful po initiation. CXR 11/28 question small dependent effusions and some left lower lobe volume loss. Cardiomegaly.      SLP Plan  Continue with current plan of care       Recommendations  Diet recommendations: Dysphagia 3 (mechanical soft);Thin liquid Liquids provided via: Cup;Straw Medication Administration: Whole meds with liquid Supervision: Patient able to self feed;Full supervision/cueing for  compensatory strategies Compensations: Slow rate;Small sips/bites Postural Changes and/or Swallow Maneuvers: Seated upright 90 degrees                Oral Care Recommendations: Oral care BID Follow up Recommendations: Other (comment);Skilled Nursing facility(TBD) SLP Visit Diagnosis: Dysphagia, oral phase (R13.11) Plan: Continue with current plan of care       GO                Mccrae Speciale, Selinda Orion 02/05/2017, 3:11 PM

## 2017-02-05 NOTE — Progress Notes (Signed)
Family Medicine Teaching Service Daily Progress Note Intern Pager: [phone]  Patient name: [name] Medical record number: [id number] Date of birth: [birth year] Age: 61 y.o. Gender: male  Primary Care Provider: System, Pcp Not In Consultants: CCM, Cardiology  Code Status: Full   Pt Overview and Major Events to Date:  11/28 - admitted for AMS, slurred speech 11/29 - transferred to ICU for worsening mental and respiratory status, placed on BiPAP 12/1 - transfer back to FM service  Assessment and Plan: Brayn Eckstein [name] a 61 y.o.malepresenting with worsening slurred speech for the past 2 weeks with right arm swelling. 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  AMS/Slurred speech,resolved likely due to Respiratory failure:  Patient at [facility], has history of "not acting right" per nursing home. Patient endorsed two weeks of worsening "slurred speech" on admission. CT Head showed multiple small chronic infarcts, chronic ischemic changes, and parenchymal volume loss of the brain, MRI without acute changes.Patient became increasingly somnolent with reported apneic episodes. CCM consulted and transferred to ICU 11/29. ABG indicative of hypercarbic respiratory failure and placed on BiPAP. Patient did well on BiPAP with improved mental and respiratory status and became stable for transfer back to the floor 12/1. Given hx of seizures cannot rule out possible seizure. Keppra level wnl so less likely related to seizure - continue home Keppra -originally recommended CPAP at night, but patient refused and said he had no interest in using.  - PT/OT eval, PT recs of SNF - continue to encourage OOB and re-orientation as this will help with disorientation   RUE Cellulitis Presented with RUE erythema concerning for cellulitis. No pain, no significant erythema, significant swelling. Blood cultures  NGTD. Hypothermia resolved with bear hugger.Previouslyon Vanc/Zosyn (11/28 -12/1). RUE ultrasound negative for blood clot  - If continues to worsening, could consider CT chest for possibly thoracic outlet syndrome -Continue Keflex (day #4)  Hypothermia-resolved Temperature 97.4 today. TSH wnl. Cortisol low, suspected to have some degree of adrenal insufficiency.  -continue prednisone 30mg    HFrEFExacerbation Fluid overload likely combination of heart failure and low albumin. Hypervolemic on exam- Anasarca. Significant scrotal/penile edema - non-tender. Weight decreased from 12/3, 234lb > 230 lb, but still elevated from admission weight of 195 lb, though unsure if accurate. net negative -2.4L yesterday, with net negative -3.6L as of admission.Repeat echo on 12/3 showing EF of 15-20% and diffuse hypokinesis with PA peak pressure of 62 mm Hg, showing decreased EF compared to last ECHO in 09/2016 with EF 20-25%, diffuse LV hypokinesis, and elevated pulmonary artery pressure to 63 mmHg. BNP is elevated to 3205 on admission but down from last admission (3536). S/p IV Lasix 20mg  in ICU 11/30. Initial transitioned to home lasix on 12/1,concern for worsening edema and was transitioned back to 80mg  IV lasix. Repeat EKG on 12/3 showing new onset irregular heart rate with concern for atrial fibrilaltion, cardiology consulted but stated consult not needed at that time.  - Continue home meds Bisoprolol, Entresto - Continue IV lasix 80 mg daily  - HF team consulted due to worsening echo. Will not see inpatient but will follow as outpatient  - Strict I's/O's - Daily weights  HTN Improving with aggressive diuresis. Most recent BP 126/98.  - Continue home meds Bisoprolol 5mg  BID and Entresto BID -Lasix 80 IV  Hypoalbunemia  Likely due to malnutrition.UA with protein, 24 hour urine protein elevated to 983 so likely kidneys excreting  large amounts of protein.  - Will consult nutrition - nephrology  consulted   HLD Patient has history of hyperlipidemia well controlled on statin. - Continue home Atorvastatin 40mg  daily  T2DM Patient takes metformin 500mg  BID. A1c is 8.0. CBG of 196 on evening of 02/03/17.  - CBGs at bedtime and before meals - sSSI and monitor CBGs.  Elevated Alkaline Phosphatase chronic. No prior GGT on file,- continues to be elevated at 177 on 02/05/17  Seizure Disorder Patient continues to have no symptoms of seizure. EEG obtained due to AMS, no seizure activity identified, indicative of encephalopathy. - Continue Levetiracetam 750mg  BID - keppra level  Urinary Retention Patient is taking flomax at nursing home to help with urinary retention. - Continue Flomax 0.8mg  daily  - Urine catheter in place  Constipation Patient reports no BM despite bowel regimen -milk and molasses enema ordered  Disposition Patient will likely go back to SNF. Given that patient has had worsening CHF even with compliance, given that he has been in a SNF x 1 month, may need to consider palliative care consultation in the future.  -will discharge to SNF -if in 3 months patient is not improving, likely will need palliative care consultation    FEN/GI:Dysphagia 2 Prophylaxis:Lovenox  Disposition: likely SNF   Subjective:  Patient today feels "awful". States his abdomen feels full and has pain. Denies pain elsewhere. Patient states he has not had a BM despite bowel regimen. States that he does not have chest pain. Denies fever but endorses chills. Patient states edema is not changed. States  He has not moved out of bed/   Objective: Temp:  [97.3 F (36.3 C)-98.2 F (36.8 C)] 98 F (36.7 C) (12/04 1149) Pulse Rate:  [60-71] 60 (12/04 1130) Resp:  [12-18] 14 (12/04 1130) BP: (143-150)/(92-120) 144/110 (12/04 1130) SpO2:  [87 %-99 %] 98 % (12/04 1130) Weight:  [230 lb 9.6 oz (104.6 kg)] 230 lb 9.6 oz (104.6 kg) (12/04 0100) Physical Exam: General: AAO x 3,  NAD Cardiovascular: RRR, no MRG Respiratory: CTAB, no wheezes, rales, or rhonchi  Abdomen: soft, non tender, distended, bowel sounds x 4 quadrants  Extremities: edema throughout. Slight edema in legs bilaterally, 1+ pitting edema in upper extremities bilaterally, non tender   Laboratory: Recent Labs  Lab 02/03/17 0358 02/04/17 0532 02/05/17 0407  WBC 7.8 7.2 7.5  HGB 13.4 14.0 14.5  HCT 41.6 43.8 43.4  PLT 244 248 227   Recent Labs  Lab 02/03/17 0358 02/04/17 0532 02/05/17 0407  NA 139 140 139  K 3.8 3.7 3.8  CL 103 102 99*  CO2 27 31 32  BUN 29* 31* 32*  CREATININE 1.12 1.12 0.92  CALCIUM 8.7* 8.8* 8.5*  PROT 5.4* 5.7* 5.1*  BILITOT 1.0 0.9 1.1  ALKPHOS 173* 187* 177*  ALT 25 33 39  AST 28 34 33  GLUCOSE 143* 123* 157*    Ref. Range 02/03/2017 13:45  Urine Total Volume-UPROT Latest Units: mL 4,550  Collection Interval-UPROT Latest Units: hours 20  Protein, Urine Latest Units: mg/dL 18  Protein, 24H Urine Latest Ref Range: 50 - 100 mg/day 983 (H)    Ref. Range 01/30/2017 23:21  Levetiracetam, S Latest Ref Range: 10.0 - 40.0 ug/mL 26.2   Imaging/Diagnostic Tests: Dg Chest 2 View  Result Date: 01/30/2017 CLINICAL DATA:  Altered level of consciousness. EXAM: CHEST  2 VIEW COMPARISON:  12/20/2016 FINDINGS: Artifact overlies chest. There is cardiomegaly. Mediastinal shadows are otherwise normal. The lungs appear clear. Cannot  rule out some left lower lobe volume loss. Question small dependent effusions. Upper lungs appear clear. IMPRESSION: Question small dependent effusions and some left lower lobe volume loss. Cardiomegaly. Electronically Signed   By: Nelson Chimes M.D.   On: 01/30/2017 13:48   Ct Head Wo Contrast  Result Date: 01/30/2017 CLINICAL DATA:  61 y/o  M; altered mental status. EXAM: CT HEAD WITHOUT CONTRAST TECHNIQUE: Contiguous axial images were obtained from the base of the skull through the vertex without intravenous contrast. COMPARISON:  10/19/2016 MRI  of the head. FINDINGS: Brain: No evidence of acute infarction, hemorrhage, hydrocephalus, extra-axial collection or mass lesion/mass effect. Small chronic infarcts in bilateral anterolateral frontal lobes, right posterolateral frontal lobe, left parietal lobe, and left cerebellar hemisphere. Stable chronic microvascular ischemic changes of white matter and parenchymal volume loss of the brain. Vascular: Calcific atherosclerosis of the carotid siphons. Skull: Normal. Negative for fracture or focal lesion. Sinuses/Orbits: No acute finding. Increased attenuation of right globe may represent vitreous hemorrhage or silicon tamponade. Other: None. IMPRESSION: 1. No acute intracranial abnormality identified. 2. Multiple small chronic infarcts are stable. Stable chronic microvascular ischemic changes and parenchymal volume loss of the brain. 3. Increased density of right globe may represent vitreous hemorrhage of silicon tamponade, clinical correlation recommended. Electronically Signed   By: Kristine Garbe M.D.   On: 01/30/2017 13:38   Mr Brain Wo Contrast  Result Date: 02/01/2017 CLINICAL DATA:  Worsened slurred speech over the last 2 weeks. Right arm cellulitis. EXAM: MRI HEAD WITHOUT CONTRAST TECHNIQUE: Multiplanar, multiecho pulse sequences of the brain and surrounding structures were obtained without intravenous contrast. COMPARISON:  CT 01/30/2017.  MRI 10/19/2016. FINDINGS: Brain: Diffusion imaging does not show any acute or subacute infarction. The brainstem shows T2 signal throughout the pons which appears the same as was seen in August of this year. Findings probably represent chronic small-vessel change, but there is no volume loss or focal lacunar injury. Few old small vessel cerebellar infarctions. Cerebral hemispheres show numerous scattered old cortical and subcortical infarctions affecting both frontal and parietal lobes with hemosiderin staining most consistent with multiple old embolic  infarctions from the heart or ascending aorta. These appear the same as were seen previously. No evidence of recent infarction, mass lesion, acute hemorrhage, hydrocephalus or extra-axial collection. Vascular: Major vessels at the base of the brain show flow. Skull and upper cervical spine: Negative Sinuses/Orbits: Sinuses are clear. There are bilateral mastoid effusions. Markedly abnormal appearance of the right globe, possibly secondary to ocular hemorrhage or treatment for detached retina. This was not present on the previous MRI. Other: None IMPRESSION: No acute intracranial finding. Chronic small-vessel ischemic changes of the pons and cerebellum. 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 No new finding. No abnormal appearance to the right globe consistent with hemorrhage or treatment for detached retina. Electronically Signed   By: Nelson Chimes M.D.   On: 02/01/2017 07:05   Ir Abdomen US Limited  Result Date: 01/22/2017 CLINICAL DATA:  Ascites and possible paracentesis. EXAM: LIMITED ABDOMEN ULTRASOUND FOR ASCITES TECHNIQUE: Limited ultrasound survey for ascites was performed in all four abdominal quadrants. COMPARISON:  None. FINDINGS: Survey of the peritoneal cavity by ultrasound demonstrates a relatively small volume of intraperitoneal ascites. Paracentesis was not performed today. IMPRESSION: Small volume of ascites in the peritoneal cavity. Paracentesis procedure was not performed today. Electronically Signed   By: Aletta Edouard M.D.   On:  01/22/2017 11:26     Caroline More, DO 02/05/2017, 12:03 PM PGY-1, [place] Intern pager: [phone], text pages welcome

## 2017-02-06 DIAGNOSIS — I499 Cardiac arrhythmia, unspecified: Secondary | ICD-10-CM

## 2017-02-06 LAB — GLUCOSE, CAPILLARY
Glucose-Capillary: 178 mg/dL — ABNORMAL HIGH (ref 65–99)
Glucose-Capillary: 129 mg/dL — ABNORMAL HIGH (ref 65–99)
Glucose-Capillary: 170 mg/dL — ABNORMAL HIGH (ref 65–99)
Glucose-Capillary: 185 mg/dL — ABNORMAL HIGH (ref 65–99)
Glucose-Capillary: 189 mg/dL — ABNORMAL HIGH (ref 65–99)

## 2017-02-06 LAB — BASIC METABOLIC PANEL
Anion gap: 8 (ref 5–15)
BUN: 27 mg/dL — ABNORMAL HIGH (ref 6–20)
CO2: 33 mmol/L — ABNORMAL HIGH (ref 22–32)
Calcium: 8.5 mg/dL — ABNORMAL LOW (ref 8.9–10.3)
Chloride: 97 mmol/L — ABNORMAL LOW (ref 101–111)
Creatinine, Ser: 0.86 mg/dL (ref 0.61–1.24)
GFR calc Af Amer: >60 mL/min (ref >60)
GFR calc non Af Amer: >60 mL/min (ref >60)
Glucose, Bld: 132 mg/dL — ABNORMAL HIGH (ref 65–99)
Potassium: 3.9 mmol/L (ref 3.5–5.1)
Sodium: 138 mmol/L (ref 135–145)

## 2017-02-06 MED ORDER — TORSEMIDE 20 MG PO TABS
80.0000 mg | ORAL_TABLET | Freq: Two times a day (BID) | ORAL | Status: DC
  Administered 2017-02-06 – 2017-02-07 (×3): 80 mg via ORAL
  Filled 2017-02-06 (×4): qty 4

## 2017-02-06 MED ORDER — PREDNISONE 20 MG PO TABS
20.0000 mg | ORAL_TABLET | Freq: Every day | ORAL | Status: DC
  Administered 2017-02-07: 20 mg via ORAL
  Filled 2017-02-06: qty 1

## 2017-02-06 NOTE — Plan of Care (Signed)
  Completed/Met SLP Dysphagia Goals Misc Dysphagia Goal 02/06/2017 1058 - Completed/Met by Colon Flattery B, CCC-SLP

## 2017-02-06 NOTE — Progress Notes (Signed)
5unit insulin aspart once  given as ordered

## 2017-02-06 NOTE — Progress Notes (Signed)
pts temperature taken orally 97.4 Rn rechecked pts tempeture rectally and read at 97.1 MD notified

## 2017-02-06 NOTE — Progress Notes (Signed)
Rn told pt that the MD would like him to have an enema pt responded to RN " NO NO NO I am not doing that" Rn explained to pt why and pt still refused. Will try again

## 2017-02-06 NOTE — Progress Notes (Signed)
Family Medicine Teaching Service Daily Progress Note Intern Pager: [phone]  Patient name: [name] Medical record number: [id number] Date of birth: 1955/04/11 Age: 61 y.o. Gender: male  Primary Care Provider: System, Pcp Not In Consultants: nephrology, heart failure, CCM Code Status: Full   Pt Overview and Major Events to Date:  11/28 - admitted for AMS, slurred speech 11/29 - transferred to ICU for worsening mental and respiratory status, placed on BiPAP 12/1 - transfer back to FM service  Assessment and Plan: [name] a 60 y.o.malepresenting with worsening slurred speech for the past 2 weeks with right arm swelling. 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  AMS/Slurred speech,resolved likely due to Respiratory failure:  Patient at [facility], has history of "not acting right" per nursing home. Patient endorsed two weeks of worsening "slurred speech" on admission. CT Head showed multiple small chronic infarcts, chronic ischemic changes, and parenchymal volume loss of the brain, MRI without acute changes. Given hx of seizurescannot rule out possibleseizure. Keppra level wnl so less likely related to seizure. Patient increasing drowsy on 12/4, ABG ordered showing increased pCO2 of 53 with pO2 of 63.7. PH wnl. Patient is more alert today.   - continue home Keppra -recommended CPAP, but patient refusing  - PT/OT eval, PT recs of SNF - SLP following, appreciate recommendations  - continue to encourage OOB and re-orientation as this will help with disorientation   RUE Cellulitis Presented with RUE erythema concerning for cellulitis. No pain, no significant erythema, significant swelling. Blood cultures NGTD.Previouslyon Vanc/Zosyn (11/28 -12/1), and then Keflex x 4 days. RUE ultrasound negative for blood clot  - If continues to worsening, could consider CT chest for  possibly thoracic outlet syndrome  Hypothermia Temperature 97.4 today, orally. Rectal temp of 97.8. TSH wnl. Cortisol low, suspected to have some degree of adrenal insufficiency.  -continue prednisone 30mg   HFrEFExacerbation Fluid overload likely combination of heart failure and low albumin.Hypervolemic on exam- Anasarca. Significant scrotal/penile edema - non-tender. Weight decreased at 222 lbs from 230 lbs on [email or web address] negative -4.6L yesterday, with net negative -8.6L as of admission.Repeat echo on 12/3 showing EF of 15-20% and diffuse hypokinesis with PA peak pressure of 62 mm Hg, showing decreased EF compared to last ECHO in 09/2016 with EF 20-25%, diffuse LV hypokinesis, and elevated pulmonary artery pressure to 63 mmHg. BNP is elevated to 3205 on admission but down from last admission (3536). Repeat EKG on 12/3 showing new onset irregular heart rate with concern for atrial fibrilaltion, cardiology consulted but stated consult not needed at that time.  - Continue home meds Bisoprolol, Entresto - transition to oral torsemide 80 mg daily  - HF team consulted due to worsening echo. Will not see inpatient but will follow as outpatient  - Strict I's/O's - Daily weights - taper prednisone (20 mg > 10 mg > off)  HTN Improving with aggressive diuresis. Most recent BP 132/99.  - Continue home meds Bisoprolol 5mg  BID and Entresto BID -Lasix 80 IV  Hypoalbuminemia  Likely due to malnutrition.UA with protein, 24 hour urine protein elevated to 983 so likely kidneys excreting large amounts of protein.  - Will consult nutrition - nephrology consulted, no not believe patient to have nephrotic syndrome. Recommend to continue current treatment plan of IV lasix  HLD Patient has history of hyperlipidemia well controlled on statin. - Continue home Atorvastatin 40mg  daily  T2DM Patient takes metformin 500mg   BID. A1c is 8.0. CBGof 196 on evening of 02/03/17. - CBGs at bedtime and before  meals - sSSI and monitor CBGs.  Elevated Alkaline Phosphatase chronic. No prior GGT on file,- continues to be elevatedat 177 on 02/05/17  Seizure Disorder Patient continues to have no symptoms of seizure. EEG obtained due to AMS, no seizure activity identified, indicative of encephalopathy. Keppra level 26.2. - Continue Levetiracetam 750mg  BID  Urinary Retention Patient is taking flomax at nursing home to help with urinary retention. - Continue Flomax0.8mg  daily - Urine catheter in place  Constipation Patient reports only small BM yesterday despite bowel regimen -tap water enema ordered  Disposition Patient will likely go back to SNF. Given that patient has had worsening CHF even with compliance, given that he has been in a SNF x 1 month, may need to consider palliative care consultation in the future.  -will discharge to SNF -consider palliative consultation given progression of disease process    FEN/GI:Dysphagia 2 Prophylaxis:Lovenox   Disposition: back to SNF  Subjective:  Patient today states he feels better. Appears happier and in better spirits. Patient states he had 1 small BM yesterday. Patient states he has no SOB or CP. Patient is refusing CPAP.   Objective: Temp:  [97.1 F (36.2 C)-97.8 F (36.6 C)] 97.1 F (36.2 C) (12/05 1205) Pulse Rate:  [57-70] 57 (12/05 0915) Resp:  [0-19] 0 (12/05 0915) BP: (131-147)/(91-103) 131/91 (12/05 0915) SpO2:  [90 %-99 %] 90 % (12/05 0915) Weight:  [222 lb 3.6 oz (100.8 kg)] 222 lb 3.6 oz (100.8 kg) (12/05 0207) Physical Exam: General: AAOx3, NAD, sitting at edge of bed looking outside of window when I arrived  Cardiovascular: RRR, no MRG Respiratory: CTAB, no wheezes, rales, or rhonchi  Abdomen: soft, non tender, bowel sounds normal, distended  Extremities: edema throughout, 2+ pitting edema up to knees bilaterally, right bka, non pitting edema of upper extremities bilaterally, normal grip strength, non tender   Neuro: sensation intact bilaterally   Laboratory: Recent Labs  Lab 02/03/17 0358 02/04/17 0532 02/05/17 0407  WBC 7.8 7.2 7.5  HGB 13.4 14.0 14.5  HCT 41.6 43.8 43.4  PLT 244 248 227   Recent Labs  Lab 02/03/17 0358 02/04/17 0532 02/05/17 0407 02/06/17 0525  NA 139 140 139 138  K 3.8 3.7 3.8 3.9  CL 103 102 99* 97*  CO2 27 31 32 33*  BUN 29* 31* 32* 27*  CREATININE 1.12 1.12 0.92 0.86  CALCIUM 8.7* 8.8* 8.5* 8.5*  PROT 5.4* 5.7* 5.1*  --   BILITOT 1.0 0.9 1.1  --   ALKPHOS 173* 187* 177*  --   ALT 25 33 39  --   AST 28 34 33  --   GLUCOSE 143* 123* 157* 132*     Ref. Range 02/05/2017 12:32  Sample type Unknown ARTERIAL DRAW  Delivery systems Unknown NASAL CANNULA  FIO2 Unknown 24.00  pH, Arterial Latest Ref Range: 7.350 - 7.450  7.444  pCO2 arterial Latest Ref Range: 32.0 - 48.0 mmHg 53.0 (H)  pO2, Arterial Latest Ref Range: 83.0 - 108.0 mmHg 63.7 (L)  Acid-Base Excess Latest Ref Range: 0.0 - 2.0 mmol/L 11.1 (H)  Bicarbonate Latest Ref Range: 20.0 - 28.0 mmol/L 35.7 (H)  O2 Saturation Latest Units: % 91.4  Patient temperature Unknown 98.6  Collection site Unknown LEFT RADIAL  Allens test (pass/fail) Latest Ref Range: PASS  PASS    Imaging/Diagnostic Tests: Dg Chest 2 View  Result Date: 02/05/2017 CLINICAL DATA:  Shortness of Breath EXAM: CHEST  2 VIEW COMPARISON:  01/30/2017 FINDINGS: Cardiac shadow is mildly enlarged but stable. The lungs are well aerated bilaterally. Patchy atelectatic changes are noted in the bases bilaterally. Small bilateral pleural effusions are noted posteriorly. This may contribute to some of the density seen over the lung bases. No pneumothorax is seen. No bony abnormality is seen. IMPRESSION: Mild bibasilar atelectatic changes and small bilateral pleural effusions. Electronically Signed   By: Inez Catalina M.D.   On: 02/05/2017 14:15   Dg Chest 2 View  Result Date: 01/30/2017 CLINICAL DATA:  Altered level of consciousness. EXAM:  CHEST  2 VIEW COMPARISON:  12/20/2016 FINDINGS: Artifact overlies chest. There is cardiomegaly. Mediastinal shadows are otherwise normal. The lungs appear clear. Cannot rule out some left lower lobe volume loss. Question small dependent effusions. Upper lungs appear clear. IMPRESSION: Question small dependent effusions and some left lower lobe volume loss. Cardiomegaly. Electronically Signed   By: Nelson Chimes M.D.   On: 01/30/2017 13:48   Ct Head Wo Contrast  Result Date: 01/30/2017 CLINICAL DATA:  61 y/o  M; altered mental status. EXAM: CT HEAD WITHOUT CONTRAST TECHNIQUE: Contiguous axial images were obtained from the base of the skull through the vertex without intravenous contrast. COMPARISON:  10/19/2016 MRI of the head. FINDINGS: Brain: No evidence of acute infarction, hemorrhage, hydrocephalus, extra-axial collection or mass lesion/mass effect. Small chronic infarcts in bilateral anterolateral frontal lobes, right posterolateral frontal lobe, left parietal lobe, and left cerebellar hemisphere. Stable chronic microvascular ischemic changes of white matter and parenchymal volume loss of the brain. Vascular: Calcific atherosclerosis of the carotid siphons. Skull: Normal. Negative for fracture or focal lesion. Sinuses/Orbits: No acute finding. Increased attenuation of right globe may represent vitreous hemorrhage or silicon tamponade. Other: None. IMPRESSION: 1. No acute intracranial abnormality identified. 2. Multiple small chronic infarcts are stable. Stable chronic microvascular ischemic changes and parenchymal volume loss of the brain. 3. Increased density of right globe may represent vitreous hemorrhage of silicon tamponade, clinical correlation recommended. Electronically Signed   By: Kristine Garbe M.D.   On: 01/30/2017 13:38   Mr Brain Wo Contrast  Result Date: 02/01/2017 CLINICAL DATA:  Worsened slurred speech over the last 2 weeks. Right arm cellulitis. EXAM: MRI HEAD WITHOUT  CONTRAST TECHNIQUE: Multiplanar, multiecho pulse sequences of the brain and surrounding structures were obtained without intravenous contrast. COMPARISON:  CT 01/30/2017.  MRI 10/19/2016. FINDINGS: Brain: Diffusion imaging does not show any acute or subacute infarction. The brainstem shows T2 signal throughout the pons which appears the same as was seen in August of this year. Findings probably represent chronic small-vessel change, but there is no volume loss or focal lacunar injury. Few old small vessel cerebellar infarctions. 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 These appear the same as were seen previously. No evidence of recent infarction, mass lesion, acute hemorrhage, hydrocephalus or extra-axial collection. Vascular: Major vessels at the base of the brain show flow. Skull and upper cervical spine: Negative Sinuses/Orbits: Sinuses are clear. There are bilateral mastoid effusions. Markedly abnormal appearance of the right globe, possibly secondary to ocular hemorrhage or treatment for detached retina. This was not present on the previous MRI. Other: None IMPRESSION: No acute intracranial finding. Chronic small-vessel ischemic changes of the pons and cerebellum. 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 No new finding. No abnormal appearance to the right globe consistent with hemorrhage or treatment for detached retina. Electronically Signed   By: Nelson Chimes M.D.   On: 02/01/2017 07:05   Ir Abdomen US Limited  Result Date: 01/22/2017 CLINICAL DATA:  Ascites and possible paracentesis. EXAM: LIMITED ABDOMEN ULTRASOUND FOR ASCITES TECHNIQUE: Limited ultrasound survey for ascites was  performed in all four abdominal quadrants. COMPARISON:  None. FINDINGS: Survey of the peritoneal cavity by ultrasound demonstrates a relatively small volume of intraperitoneal ascites. Paracentesis was not performed today. IMPRESSION: Small volume of ascites in the peritoneal cavity. Paracentesis procedure was not performed today. Electronically Signed   By: Aletta Edouard M.D.   On: 01/22/2017 11:26     Caroline More, DO 02/06/2017, 12:08 PM PGY-1, [place] Intern pager: [phone], text pages welcome

## 2017-02-06 NOTE — Progress Notes (Signed)
  Speech Language Pathology Treatment: Dysphagia  Patient Details Name: [name] MRN: [id number] DOB: 26-May-1955 Today's Date: 02/06/2017 Time: 4417-1278 SLP Time Calculation (min) (ACUTE ONLY): 20 min  Assessment / Plan / Recommendation Clinical Impression  Pt seen at bedside to follow up after diet advancement. Per RN, pt tolerating Dys 3 diet and thin liquids. No temp elevations, lungs clear. Safe swallow precautions posted at [facility]. Anticipate pt will continue on soft solids, given mentation and poor dentition. ST will sign off at this time. Please reconsult if needs arise.    HPI HPI: Jake Farquharson [name] a 61 y.o.malepresenting with worsening slurred speech for the past 2 weeks with right arm swelling. 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 CT showed multiple small chronic infarcts, chronic ischemic changes, and parenchymal volume loss of the brain, no acute abnormalities. MRI attempted and unable to complete. Critical care consulted and pt suspected to have cellulitis and acute hypercarbic resp failure exacerbated by benzos and underlying sleep apnea. ST consulted for hopeful po initiation. CXR 11/28 question small dependent effusions and some left lower lobe volume loss. Cardiomegaly.      SLP Plan  Discharge SLP treatment due to (comment);All goals met       Recommendations  Diet recommendations: Dysphagia 3 (mechanical soft);Thin liquid Liquids provided via: Cup;Straw Medication Administration: Whole meds with liquid Supervision: Patient able to self feed;Full supervision/cueing for compensatory strategies Compensations: Slow rate;Small sips/bites;Minimize environmental distractions Postural Changes and/or Swallow Maneuvers: Seated upright 90 degrees                Oral Care Recommendations: Oral care BID Follow up Recommendations: Other (comment);Skilled Nursing  facility;24 hour supervision/assistance SLP Visit Diagnosis: Dysphagia, oral phase (R13.11) Plan: Discharge SLP treatment due to (comment);All goals met       GO               Celia B. Quentin Ore [facility], CCC-SLP Speech Language Pathologist [phone]  [name] 02/06/2017, 10:56 AM

## 2017-02-06 NOTE — Progress Notes (Signed)
Pt CBG was above the level set for insulin sliding scale, on call physician from family medicine teaching service was paged twice called back on phone that he is with a pt and will look at pt chart and put an order for insulin, will continue to monitor

## 2017-02-07 LAB — BASIC METABOLIC PANEL
Anion gap: 7 (ref 5–15)
BUN: 25 mg/dL — ABNORMAL HIGH (ref 6–20)
CO2: 39 mmol/L — ABNORMAL HIGH (ref 22–32)
Calcium: 8.4 mg/dL — ABNORMAL LOW (ref 8.9–10.3)
Chloride: 93 mmol/L — ABNORMAL LOW (ref 101–111)
Creatinine, Ser: 0.96 mg/dL (ref 0.61–1.24)
GFR calc Af Amer: >60 mL/min (ref >60)
GFR calc non Af Amer: >60 mL/min (ref >60)
Glucose, Bld: 188 mg/dL — ABNORMAL HIGH (ref 65–99)
Potassium: 3.4 mmol/L — ABNORMAL LOW (ref 3.5–5.1)
Sodium: 139 mmol/L (ref 135–145)

## 2017-02-07 LAB — GLUCOSE, CAPILLARY
Glucose-Capillary: 160 mg/dL — ABNORMAL HIGH (ref 65–99)
Glucose-Capillary: 106 mg/dL — ABNORMAL HIGH (ref 65–99)
Glucose-Capillary: 228 mg/dL — ABNORMAL HIGH (ref 65–99)

## 2017-02-07 MED ORDER — POLYETHYLENE GLYCOL 3350 17 G PO PACK
17.0000 g | PACK | Freq: Two times a day (BID) | ORAL | Status: DC
  Administered 2017-02-07: 17 g via ORAL
  Filled 2017-02-07: qty 1

## 2017-02-07 MED ORDER — POLYETHYLENE GLYCOL 3350 17 G PO PACK: 17.0000 g | PACK | Freq: Two times a day (BID) | ORAL | 0 refills | Status: AC

## 2017-02-07 MED ORDER — PREDNISONE 10 MG PO TABS: 10.0000 mg | ORAL_TABLET | Freq: Every day | ORAL | Status: DC

## 2017-02-07 MED ORDER — PREDNISONE 10 MG PO TABS: 10.0000 mg | ORAL_TABLET | Freq: Every day | ORAL | 0 refills | Status: DC

## 2017-02-07 MED ORDER — TORSEMIDE 20 MG PO TABS: 80.0000 mg | ORAL_TABLET | Freq: Two times a day (BID) | ORAL | 0 refills | Status: DC

## 2017-02-07 NOTE — Progress Notes (Signed)
Report called to [facility]

## 2017-02-07 NOTE — Progress Notes (Signed)
Patient will DC to: [facility]  Anticipated DC date: 02/07/17 Family notified: Sister called CSW back Transport by: PTAR 4:30pm   Per MD patient ready for DC to [facility]. RN, patient, patient's family, and facility notified of DC. Discharge Summary sent to facility. RN given number for report [phone]). DC packet on chart. Ambulance transport requested for patient.   CSW signing off.  Cedric Fishman, [place] Social Worker [phone]

## 2017-02-07 NOTE — Progress Notes (Signed)
[name] to be D/C'd Skilled nursing facility per MD order.  Discussed with the patient and all questions fully answered.  VSS, Skin clean, dry and intact without evidence of skin break down, no evidence of skin tears noted. IV catheter discontinued intact. Site without signs and symptoms of complications. Dressing and pressure applied.  An After Visit Summary was printed and given to the patient. Patient received prescription.  D/c education completed with patient/family including follow up instructions, medication list, d/c activities limitations if indicated, with other d/c instructions as indicated by MD - patient able to verbalize understanding, all questions fully answered.   Patient instructed to return to ED, call 911, or call MD for any changes in condition.     Luci Bank 02/07/2017 5:58 PM

## 2017-02-07 NOTE — Discharge Instructions (Signed)
You were admitted for altered mental status and also a right arm infection. You improved after you wore a CPAP machine and your breathing improved. Your infection improved after antibiotics.  During admission you had to be on increased IV lasix for increased fluid. You were changed to torsemide prior to discharge for home diuresis.   Please follow up with your PCP within 1 week of discharge  If you have increased shortness of breath, altered mental status, or increased swelling please come to the Emergency room.

## 2017-02-07 NOTE — Clinical Social Work Note (Signed)
Clinical Social Work Assessment  Patient Details  Name: [name] MRN: [id number] Date of Birth: January 28, 1956  Date of referral:  02/05/17               Reason for consult:  Discharge Planning                Permission sought to share information with:  Chartered certified accountant granted to share information::  Yes, Verbal Permission Granted  Name::        Agency::  [facility]  Relationship::     Contact Information:     Housing/Transportation Living arrangements for the past 2 months:  [place] Information:  Patient Patient Interpreter Needed:  None Criminal Activity/Legal Involvement Pertinent to Current Situation/Hospitalization:  No - Comment as needed Significant Relationships:  Siblings(Sister) Lives with:  Facility Resident Do you feel safe going back to the place where you live?  Yes Need for family participation in patient care:  No (Coment)  Care giving concerns:  CSW received consult for possible SNF placement at time of discharge. Patient resides at [facility] and [place] and will return at discharge. CSW to continue to follow and assist with discharge planning needs.   Social Worker assessment / plan:  CSW spoke with patient concerning possibility of returning to rehab at [facility].  Employment status:  Disabled (Comment on whether or not currently receiving Disability) Insurance information:  Medicaid In [place] PT Recommendations:  Not assessed at this time Information / Referral to community resources:  [place]  Patient/Family's Response to care:  Patient reports agreement with discharge plan. He requests CSW contact his sister to let her know, but there is no answer and no voicemail.   Patient/Family's Understanding of and Emotional Response to Diagnosis, Current Treatment, and Prognosis:  Patient/family is realistic regarding therapy needs and expressed being hopeful for SNF placement. Patient  expressed understanding of CSW role and discharge process as well as medical condition. No questions/concerns about plan or treatment.    Emotional Assessment Appearance:  Appears stated age Attitude/Demeanor/Rapport:  Other(Appropriate) Affect (typically observed):  Accepting, Appropriate Orientation:  Oriented to Self, Oriented to Situation, Oriented to Place, Oriented to  Time Alcohol / Substance use:  Not Applicable Psych involvement (Current and /or in the community):  No (Comment)  Discharge Needs  Concerns to be addressed:  Care Coordination Readmission within the last 30 days:  No Current discharge risk:  None Barriers to Discharge:  Continued Medical Work up   [employer], [place] 02/07/2017, 2:04 PM

## 2017-02-07 NOTE — Progress Notes (Signed)
Family Medicine Teaching Service Daily Progress Note Intern Pager: [phone]  Patient name: [name] Medical record number: [id number] Date of birth: [birth year] Age: 61 y.o. Gender: male  Primary Care Provider: System, Pcp Not In Consultants: nephrology, heart failure, CCM Code Status: Full   Pt Overview and Major Events to Date:  11/28 - admitted for AMS, slurred speech 11/29 - transferred to ICU for worsening mental and respiratory status, placed on BiPAP 12/1 - transfer back to FM service  Assessment and Plan: [name] a 61 y.o.malepresenting with worsening slurred speech for the past 2 weeks with right arm swelling. 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  AMS/Slurred speech,resolved likely due to Respiratory failure:  Resolved. Patient at [facility], has history of "not acting right" per nursing home. Patient endorsed two weeks of worsening "slurred speech" on admission. CT Head showed multiple small chronic infarcts, chronic ischemic changes, and parenchymal volume loss of the brain, MRI without acute changes. Patient is more alert today.   - continue home Keppra -recommended CPAP, but patient refusing  - PT/OT eval, PT recs of SNF - SLP following, appreciate recommendations  - continue to encourage OOB and re-orientation as this will help with disorientation  RUE Cellulitis Resolved. Presented with RUE erythema concerning for cellulitis. No pain, no significant erythema, significant swelling. Blood cultures NGTD.Previouslyon Vanc/Zosyn (11/28 -12/1), and then Keflex x 4 days. RUE ultrasound negative for blood clot  - If worsening, could consider CT chest for possibly thoracic outlet syndrome  Hypothermia Temperature 97.4 today, orally. TSH wnl. Cortisol low, suspected to have some degree of adrenal insufficiency.  -taper prednisone (20 mg > 10mg  >  off)  HFrEFExacerbation Fluid overload likely combination of heart failure and low albumin.Hypervolemic on exam- Anasarca.Weight decreased to 207 lbs from 222 lbs on [email or web address] negative -6.8L yesterday, with net negative -15.2L as of admission.Repeat echo on 12/3 showing EF of 15-20% and diffuse hypokinesis with PA peak pressure of 62 mm Hg, showing decreased EF compared tolast ECHO in 7/2018withEF 20-25%, diffuse LV hypokinesis, and elevated pulmonary artery pressure to 64mmHg. BNP is elevated to 3205on admissionbut down from last admission (3536). Repeat EKG on 12/3 showing new onset irregular heart rate with concern for atrial fibrilaltion, cardiology consulted but stated consult not needed at that time. - Continue home meds Bisoprolol, Entresto - continue oral torsemide 80 mg daily, will likely discharge with torsemide  -HF team consulteddue to worsening echo. Will not see inpatient but will follow as outpatient - Strict I's/O's - fluid restriction  - Daily weights - taper prednisone (20 mg > 10 mg > off)  HTN Improving with aggressive diuresis. Most recent BP 137/96. - Continue home meds Bisoprolol 5mg  BID and Entresto BID  Hypoalbuminemia  Likely due to malnutrition.UA with protein,24 hour urine protein elevated to 983 so likely kidneys excreting large amounts of protein. - Will consult nutrition -nephrology consulted, does not believe patient to have nephrotic syndrome. Recommend to continue current treatment plan  HLD Patient has history of hyperlipidemia well controlled on statin. - Continue home Atorvastatin 40mg  daily  T2DM Patient takes metformin 500mg  BID. A1c is 8.0. CBGof 160 on 02/07/17. - CBGs at bedtime and before meals - sSSI and monitor CBGs.  Elevated Alkaline Phosphatase chronic. No prior GGT on file,- continues to be elevatedat 177on 02/05/17  Seizure Disorder Patient continues to have no symptoms of seizure. EEG obtained due to  AMS, no seizure activity identified, indicative of encephalopathy. Keppra level 26.2. - Continue Levetiracetam 750mg  BID  Urinary Retention Patient is taking flomax at nursing home to help with urinary retention. - Continue Flomax0.8mg  daily - Urine catheter in place  Constipation Patient reports only small BM on 12/4 and no BM since despite bowel regimen -tap water enema ordered, but patient refusing  -increase miralax to bid -senna daily   Disposition Patient will likely go back to SNF. Given that patient has had worsening CHF even with compliance, given that he has been in a SNF x 1 month, may need to consider palliative care consultation in the future. Have attempted to contact patient's sister, but unable to reach her over the phone.  -will discharge to SNF -consider palliative consultation given progression of disease process   FEN/GI:Dysphagia 2 Prophylaxis:Lovenox  Disposition: SNF  Subjective:  Patient today states he is doing "fine". States his breathing is improved, and denies CP. Denies pain in extremities. Denies dizziness. States abdominal pain is resolved. Denies BM yesterday or today but is refusing enema.   Objective: Temp:  [97.1 F (36.2 C)-98 F (36.7 C)] 97.7 F (36.5 C) (12/06 0915) Pulse Rate:  [25-66] 66 (12/06 0515) Resp:  [14-19] 14 (12/06 0915) BP: (121-146)/(89-102) 146/102 (12/06 0915) SpO2:  [93 %-97 %] 95 % (12/06 0515) Weight:  [207 lb 0.2 oz (93.9 kg)] 207 lb 0.2 oz (93.9 kg) (12/06 0242) Physical Exam: General: awake and alert, laying in bed Cardiovascular: RRR, no MRG Respiratory: CTAB, no wheezes, rales or rhonchi  Abdomen: soft, non tender, distended, bowel sounds normal  Extremities: right bka, edema decreased in upper extremities bilaterally, 1+ pitting edema in left let to knee. Wound care dressing on lower extremities bilaterally   Laboratory: Recent Labs  Lab 02/03/17 0358 02/04/17 0532 02/05/17 0407  WBC 7.8 7.2 7.5   HGB 13.4 14.0 14.5  HCT 41.6 43.8 43.4  PLT 244 248 227   Recent Labs  Lab 02/03/17 0358 02/04/17 0532 02/05/17 0407 02/06/17 0525 02/07/17 0520  NA 139 140 139 138 139  K 3.8 3.7 3.8 3.9 3.4*  CL 103 102 99* 97* 93*  CO2 27 31 32 33* 39*  BUN 29* 31* 32* 27* 25*  CREATININE 1.12 1.12 0.92 0.86 0.96  CALCIUM 8.7* 8.8* 8.5* 8.5* 8.4*  PROT 5.4* 5.7* 5.1*  --   --   BILITOT 1.0 0.9 1.1  --   --   ALKPHOS 173* 187* 177*  --   --   ALT 25 33 39  --   --   AST 28 34 33  --   --   GLUCOSE 143* 123* 157* 132* 188*    Imaging/Diagnostic Tests: Dg Chest 2 View  Result Date: 02/05/2017 CLINICAL DATA:  Shortness of Breath EXAM: CHEST  2 VIEW COMPARISON:  01/30/2017 FINDINGS: Cardiac shadow is mildly enlarged but stable. The lungs are well aerated bilaterally. Patchy atelectatic changes are noted in the bases bilaterally. Small bilateral pleural effusions are noted posteriorly. This may contribute to some of the density seen over the lung bases. No pneumothorax is seen. No bony abnormality is seen. IMPRESSION: Mild bibasilar atelectatic changes and small bilateral pleural effusions. Electronically Signed   By: Inez Catalina M.D.   On: 02/05/2017 14:15   Dg Chest 2 View  Result Date: 01/30/2017 CLINICAL DATA:  Altered level of consciousness. EXAM: CHEST  2 VIEW COMPARISON:  12/20/2016 FINDINGS: Artifact overlies chest. There is cardiomegaly. Mediastinal shadows are otherwise normal.  The lungs appear clear. Cannot rule out some left lower lobe volume loss. Question small dependent effusions. Upper lungs appear clear. IMPRESSION: Question small dependent effusions and some left lower lobe volume loss. Cardiomegaly. Electronically Signed   By: Nelson Chimes M.D.   On: 01/30/2017 13:48   Ct Head Wo Contrast  Result Date: 01/30/2017 CLINICAL DATA:  61 y/o  M; altered mental status. EXAM: CT HEAD WITHOUT CONTRAST TECHNIQUE: Contiguous axial images were obtained from the base of the skull through  the vertex without intravenous contrast. COMPARISON:  10/19/2016 MRI of the head. FINDINGS: Brain: No evidence of acute infarction, hemorrhage, hydrocephalus, extra-axial collection or mass lesion/mass effect. Small chronic infarcts in bilateral anterolateral frontal lobes, right posterolateral frontal lobe, left parietal lobe, and left cerebellar hemisphere. Stable chronic microvascular ischemic changes of white matter and parenchymal volume loss of the brain. Vascular: Calcific atherosclerosis of the carotid siphons. Skull: Normal. Negative for fracture or focal lesion. Sinuses/Orbits: No acute finding. Increased attenuation of right globe may represent vitreous hemorrhage or silicon tamponade. Other: None. IMPRESSION: 1. No acute intracranial abnormality identified. 2. Multiple small chronic infarcts are stable. Stable chronic microvascular ischemic changes and parenchymal volume loss of the brain. 3. Increased density of right globe may represent vitreous hemorrhage of silicon tamponade, clinical correlation recommended. Electronically Signed   By: Kristine Garbe M.D.   On: 01/30/2017 13:38   Mr Brain Wo Contrast  Result Date: 02/01/2017 CLINICAL DATA:  Worsened slurred speech over the last 2 weeks. Right arm cellulitis. EXAM: MRI HEAD WITHOUT CONTRAST TECHNIQUE: Multiplanar, multiecho pulse sequences of the brain and surrounding structures were obtained without intravenous contrast. COMPARISON:  CT 01/30/2017.  MRI 10/19/2016. FINDINGS: Brain: Diffusion imaging does not show any acute or subacute infarction. The brainstem shows T2 signal throughout the pons which appears the same as was seen in August of this year. Findings probably represent chronic small-vessel change, but there is no volume loss or focal lacunar injury. Few old small vessel cerebellar infarctions. 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 These appear the same as were seen previously. No evidence of recent infarction, mass lesion, acute hemorrhage, hydrocephalus or extra-axial collection. Vascular: Major vessels at the base of the brain show flow. Skull and upper cervical spine: Negative Sinuses/Orbits: Sinuses are clear. There are bilateral mastoid effusions. Markedly abnormal appearance of the right globe, possibly secondary to ocular hemorrhage or treatment for detached retina. This was not present on the previous MRI. Other: None IMPRESSION: No acute intracranial finding. Chronic small-vessel ischemic changes of the pons and cerebellum. 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 No new finding. No abnormal appearance to the right globe consistent with hemorrhage or treatment for detached retina. Electronically Signed   By: Nelson Chimes M.D.   On: 02/01/2017 07:05   Ir Abdomen US Limited  Result Date: 01/22/2017 CLINICAL DATA:  Ascites and possible paracentesis. EXAM: LIMITED ABDOMEN ULTRASOUND FOR ASCITES TECHNIQUE: Limited ultrasound survey for ascites was performed in all four abdominal quadrants. COMPARISON:  None. FINDINGS: Survey of the peritoneal cavity by ultrasound demonstrates a relatively small volume of intraperitoneal ascites. Paracentesis was not performed today. IMPRESSION: Small volume of ascites in the peritoneal cavity. Paracentesis procedure was not performed today. Electronically Signed   By: Eulas Post  Kathlene Cote M.D.   On: 01/22/2017 11:26    Caroline More, DO 02/07/2017, 10:56 AM PGY-1, [place] Intern pager: [phone], text pages welcome

## 2017-03-01 IMAGING — CR DG TIBIA/FIBULA 2V*R*
2 series · 2 of 2 positions shown · non-contrast
Comparison: 09/27/2013.

CLINICAL DATA: Osteomyelitis.  Nonhealing wound.

EXAM:
RIGHT TIBIA AND FIBULA - 2 VIEW

[t tib/fib ap right]
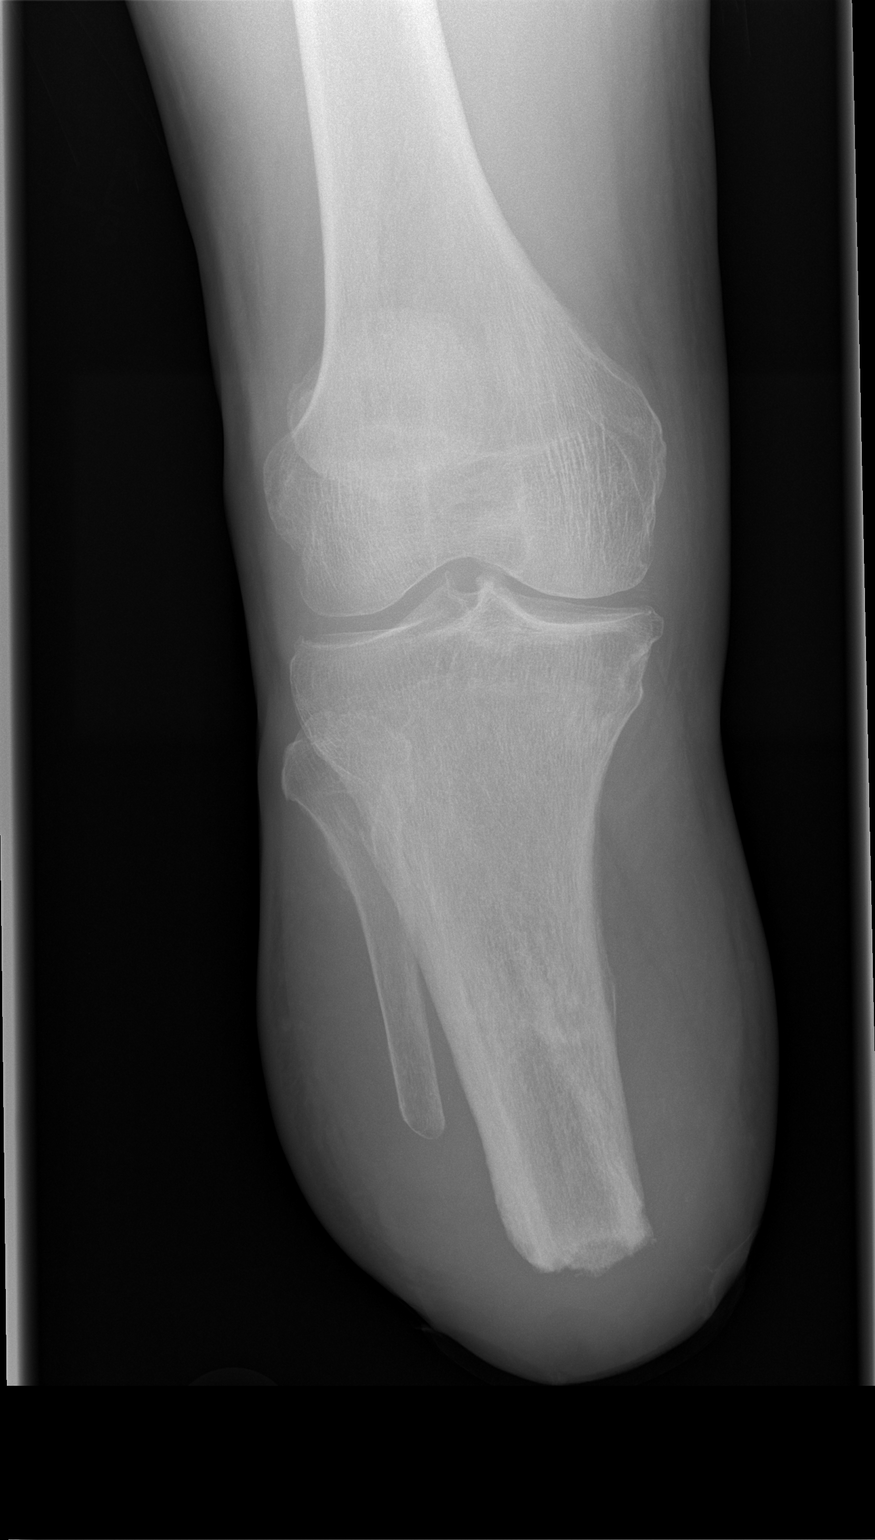

[t tib/fib lat right]
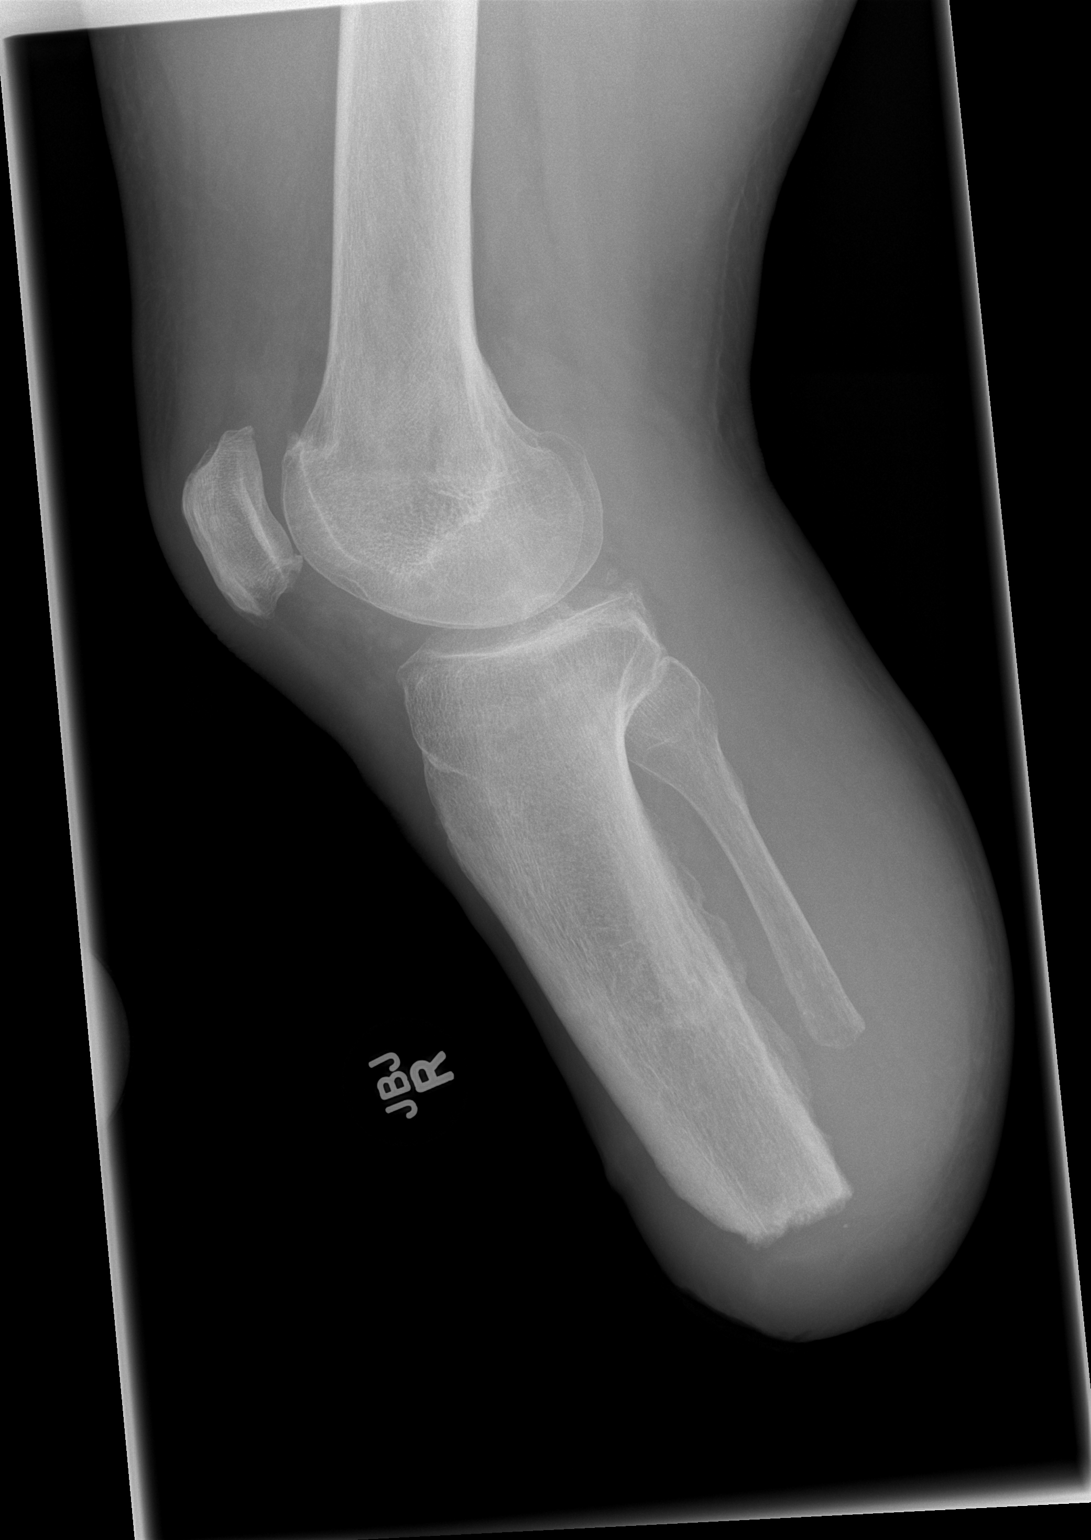

[2 of 2 positions shown; findings below may reference images not displayed]

FINDINGS: BKA. Mild cortical irregularity noted of the distal tibial stump.
Osteomyelitis cannot be excluded. Mild adjacent soft tissue swelling
cannot be excluded. No radiopaque foreign body.
IMPRESSION: Mild cortical irregularity distal tibial stump. Osteomyelitis cannot
be excluded. Adjacent mild soft tissue swelling is present. No
radiopaque foreign body.

## 2017-03-20 ENCOUNTER — Encounter: Payer: Self-pay | Admitting: *Deleted

## 2017-03-20 ENCOUNTER — Ambulatory Visit (HOSPITAL_COMMUNITY)
Admit: 2017-03-20 | Discharge: 2017-03-20 | Disposition: A | Discharge status: Home / Self Care | Payer: Medicaid Other | Attending: Vascular Surgery | Admitting: Vascular Surgery

## 2017-03-20 ENCOUNTER — Ambulatory Visit (INDEPENDENT_AMBULATORY_CARE_PROVIDER_SITE_OTHER): Payer: Self-pay | Admitting: Vascular Surgery

## 2017-03-20 ENCOUNTER — Encounter: Payer: Self-pay | Admitting: Vascular Surgery

## 2017-03-20 ENCOUNTER — Other Ambulatory Visit: Payer: Self-pay | Admitting: *Deleted

## 2017-03-20 VITALS — BP 112/78 | HR 94 | Temp 97.7°F | Resp 20 | Ht 70.0 in | Wt 207.0 lb

## 2017-03-20 DIAGNOSIS — L98499 Non-pressure chronic ulcer of skin of other sites with unspecified severity: Secondary | ICD-10-CM | POA: Diagnosis present

## 2017-03-20 DIAGNOSIS — M7989 Other specified soft tissue disorders: Secondary | ICD-10-CM | POA: Insufficient documentation

## 2017-03-20 DIAGNOSIS — S81802A Unspecified open wound, left lower leg, initial encounter: Secondary | ICD-10-CM

## 2017-03-20 NOTE — H&P (View-Only) (Signed)
Patient name: [name] MRN: [id number] DOB: February 12, 1956 Sex: male   REASON FOR CONSULT:    Ulcers left leg.  The consult is requested by [facility] facility.  HPI:   [name] is a pleasant 62 y.o. male, who presents with nonhealing ulcers of both legs.  He had a right below the knee amputation several years ago according to the patient.  This is been revised once as he fell and disrupted the stump.  He does have a prosthesis for this but has not been wearing it and has been nonambulatory recently.  In addition he is developed multiple wounds on his left leg.  He had evidence of chronic venous insufficiency on exam and was sent for vascular evaluation.  He is nonambulatory and gives no history of rest pain.  He denies fever or chills.  I have reviewed the records that were sent from the referring office.  The patient is followed at [facility] and rehabilitation center.  The patient is followed with multiple wounds on the left leg.  He the patient is felt to have both a diabetic foot wound and venous stasis ulcers of the leg.  Despite aggressive outpatient care the wounds were worsening.  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  Patient's risk factors for peripheral vascular disease include hypertension, hyperlipidemia, type 2 diabetes, and a remote history of tobacco use.  Past Medical History:  Diagnosis Date  . Acute on chronic systolic heart failure, NYHA class 3 ([place])   . AKI (acute kidney injury) ([place])   . Anemia   . BKA stump complication ([place])   . Blind left eye   . Blind left eye   . Cardiomyopathy, dilated ([place])   . CHF (congestive heart failure) ([place])   . Combined systolic and diastolic congestive heart failure ([place])   . Congestive dilated cardiomyopathy ([place]) 07/11/2015  . Diabetes mellitus   . Diabetic neuropathy  associated with type 2 diabetes mellitus ([place])   . Hypertension   . Neuropathy   . Noncompliance with medications   . Open knee wound 10/2015   on rt bka  . PAD (peripheral artery disease) ([place])   . Protein calorie malnutrition ([place])   . Shortness of breath dyspnea     Family History  Problem Relation Age of Onset  . Cancer Mother   . Peripheral vascular disease Father    He is unaware of any history of premature cardiovascular disease.  SOCIAL HISTORY: Social History   Socioeconomic History  . Marital status: Married    Spouse name: Not on file  . Number of children: Not on file  . Years of education: Not on file  . Highest education level: Not on file  Social Needs  . Financial resource strain: Not on file  . Food insecurity - worry: Not on file  . Food insecurity - inability: Not on file  . Transportation needs - medical: Not on file  . Transportation needs - non-medical: Not on file  Occupational History  . Not on file  Tobacco Use  . Smoking status: Former Smoker    Years: 20.00    Last attempt to quit: 05/10/2000    Years since quitting: 16.8  . Smokeless tobacco: Former Systems developer    Quit date: 02/22/2000  Substance and Sexual Activity  . Alcohol use: No  Alcohol/week: 0.0 oz  . Drug use: No  . Sexual activity: Not on file  Other Topics Concern  . Not on file  Social History Narrative   ** Merged History Encounter **       Lives alone.     No Known Allergies  Current Outpatient Medications  Medication Sig Dispense Refill  . Amino Acids-Protein Hydrolys (PRO-STAT) LIQD Take 30 mLs by mouth 2 (two) times daily.    Marland Kitchen aspirin 325 MG tablet Take 1 tablet (325 mg total) by mouth daily. 30 tablet 0  . atorvastatin (LIPITOR) 40 MG tablet Take 1 tablet (40 mg total) by mouth daily. (Patient taking differently: Take 40 mg by mouth at bedtime. ) 30 tablet 0  . bacitracin ointment Apply topically 2 (two) times daily. (Patient not taking: Reported on 01/30/2017) 120  g 0  . bisoprolol (ZEBETA) 5 MG tablet Take 1 tablet (5 mg total) by mouth daily. (Patient taking differently: Take 5 mg by mouth 2 (two) times daily. ) 30 tablet 1  . brimonidine (ALPHAGAN) 0.2 % ophthalmic solution PLACE 1 DROP INTO THE LEFT EYE 2 (TWO) TIMES DAILY. 5 mL 0  . dorzolamide-timolol (COSOPT) 22.3-6.8 MG/ML ophthalmic solution Place 1 drop into the left eye 2 (two) times daily. 10 mL 12  . levETIRAcetam (KEPPRA) 750 MG tablet Take 1 tablet (750 mg total) by mouth 2 (two) times daily. 60 tablet 0  . metFORMIN (GLUCOPHAGE) 500 MG tablet Take 1 tablet (500 mg total) by mouth 2 (two) times daily with a meal. 60 tablet 6  . Multiple Vitamins-Minerals (DECUBI-VITE) CAPS Take 1 capsule by mouth daily.    . polyethylene glycol (MIRALAX / GLYCOLAX) packet Take 17 g by mouth 2 (two) times daily. 14 each 0  . predniSONE (DELTASONE) 10 MG tablet Take 1 tablet (10 mg total) by mouth daily with breakfast. On 02/08/17 1 tablet 0  . QUEtiapine (SEROQUEL) 25 MG tablet Take 25 mg by mouth every 12 (twelve) hours.    . sacubitril-valsartan (ENTRESTO) 49-51 MG Take 1 tablet by mouth 2 (two) times daily. 60 tablet 3  . tamsulosin (FLOMAX) 0.4 MG CAPS capsule Take 2 capsules (0.8 mg total) by mouth daily after supper. 60 capsule 0  . torsemide (DEMADEX) 20 MG tablet Take 4 tablets (80 mg total) by mouth 2 (two) times daily. 240 tablet 0  . vitamin A 10000 UNIT capsule Take 10,000 Units by mouth daily.    . vitamin C (ASCORBIC ACID) 500 MG tablet Take 500 mg by mouth daily.    Marland Kitchen zinc sulfate 220 (50 Zn) MG capsule Take 220 mg by mouth daily.     No current facility-administered medications for this visit.     REVIEW OF SYSTEMS:  [X]  denotes positive finding, [ ]  denotes negative finding Cardiac  Comments:  Chest pain or chest pressure:    Shortness of breath upon exertion:    Short of breath when lying flat:    Irregular heart rhythm:        Vascular    Pain in calf, thigh, or hip brought on by  ambulation:    Pain in feet at night that wakes you up from your sleep:     Blood clot in your veins:    Leg swelling:         Pulmonary    Oxygen at home:    Productive cough:     Wheezing:         Neurologic  Sudden weakness in arms or legs:     Sudden numbness in arms or legs:     Sudden onset of difficulty speaking or slurred speech:    Temporary loss of vision in one eye:     Problems with dizziness:         Gastrointestinal    Blood in stool:     Vomited blood:         Genitourinary    Burning when urinating:     Blood in urine:        Psychiatric    Major depression:         Hematologic    Bleeding problems:    Problems with blood clotting too easily:        Skin    Rashes or ulcers: X  left leg      Constitutional    Fever or chills:     PHYSICAL EXAM:   Vitals:   03/20/17 1502  BP: 112/78  Pulse: 94  Resp: 20  Temp: 97.7 F (36.5 C)  TempSrc: Oral  SpO2: 97%  Weight: 207 lb (93.9 kg)  Height: 5\' 10"  (1.778 m)    GENERAL: The patient is a well-nourished male, in no acute distress. The vital signs are documented above. CARDIAC: There is a regular rate and rhythm.  VASCULAR: I do not detect carotid bruits. On the right side he has a palpable femoral and popliteal pulse.  He has a below the knee amputation on the right. On the left side he has a palpable femoral and popliteal pulse.  I could not palpate pedal pulses. He has a monophasic dorsalis pedis signal on the left with the Doppler and I could not obtain a posterior tibial signal. He has no significant lower extremity swelling on the left.  He does have hyperpigmentation on the left consistent with chronic venous insufficiency. PULMONARY: There is good air exchange bilaterally without wheezing or rales. ABDOMEN: Soft and non-tender with normal pitched bowel sounds.  I do not palpate an abdominal aortic aneurysm. MUSCULOSKELETAL: He has a right below the knee amputation. NEUROLOGIC: No focal  weakness or paresthesias are detected. SKIN: He has an open wound on his right below the knee amputation stump below the patella that measures 3 cm x 3 cm in diameter.  On the left leg he has a wound above the medial malleolus which measures 1.5 cm x 0.8 cm.  He has a wound overlying the left knee which measures 2 cm in diameter and a wound just below that but also measures 2 cm in diameter.  All of these wounds have reasonable granulation tissue. PSYCHIATRIC: The patient has a normal affect.  DATA:    LEFT LOWER EXTREMITY VENOUS DUPLEX: I have independently interpreted his left lower extremity venous duplex scan.  There is no evidence of DVT or superficial thrombophlebitis in the left leg.  There is no significant superficial or deep venous reflux noted.  ARTERIAL DOPPLER STUDY: I did find an arterial Doppler study that was done on 09/24/2016.  On the left side patient was noted to have a triphasic dorsalis pedis signal and a biphasic posterior tibial signal.  ABI was 1.1.  ABI could not be obtained to 0 wounds on the left calf at that time.  The patient has a right below the knee amputation.  MEDICAL ISSUES:   NONHEALING WOUNDS BOTH LOWER EXTREMITIES: This patient had a fairly normal Doppler study in July but on my exam has evidence of  tibial artery occlusive disease.  I think his disease has progressed.  Certainly this could put him at significant risk for limb loss given his nonhealing wounds, diabetes, and evidence of tibial artery occlusive disease.  This reason I recommended that we proceed with arteriography to see if there is any options for revascularization.  Hopefully he might be a candidate for endovascular approach given that open surgery would be associated with significant risk. This patient has multiple significant medical comorbidities.  These include congestive dilated cardiomyopathy with combined systolic and diastolic congestive heart failure, cardiorenal syndrome with renal failure,  history of moderate to severe pulmonary hypertension, and history of embolic stroke.  His arteriogram is scheduled for 03/25/2017. I have reviewed with the patient the indications for arteriography. In addition, I have reviewed the potential complications of arteriography including but not limited to: Bleeding, arterial injury, arterial thrombosis, dye action, renal insufficiency, or other unpredictable medical problems. I have explained to the patient that if we find disease amenable to angioplasty we could potentially address this at the same time. I have discussed the potential complications of angioplasty and stenting, including but not limited to: Bleeding, arterial thrombosis, arterial injury, dissection, or the need for surgical intervention.  He apparently has some history of chronic kidney disease although his most recent labs looked reasonable.  GFR was greater than 60.  Creatinine was 0.96.  This was on 02/07/2017.  Of note, although his venous duplex scan did not show significant reflux, certainly his exam is consistent with chronic venous insufficiency.  Regardless there is no significant reflux noted in the great saphenous vein so he is not a candidate for any type of laser ablation.  [name] [facility] [phone]

## 2017-03-20 NOTE — Progress Notes (Signed)
Patient name: [name] MRN: [id number] DOB: [birth year] Sex: male   REASON FOR CONSULT:    Ulcers left leg.  The consult is requested by [facility] facility.  HPI:   RAKESH DUTKO is a pleasant 62 y.o. male, who presents with nonhealing ulcers of both legs.  He had a right below the knee amputation several years ago according to the patient.  This is been revised once as he fell and disrupted the stump.  He does have a prosthesis for this but has not been wearing it and has been nonambulatory recently.  In addition he is developed multiple wounds on his left leg.  He had evidence of chronic venous insufficiency on exam and was sent for vascular evaluation.  He is nonambulatory and gives no history of rest pain.  He denies fever or chills.  I have reviewed the records that were sent from the referring office.  The patient is followed at [facility] and rehabilitation center.  The patient is followed with multiple wounds on the left leg.  He the patient is felt to have both a diabetic foot wound and venous stasis ulcers of the leg.  Despite aggressive outpatient care the wounds were worsening.  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  Patient's risk factors for peripheral vascular disease include hypertension, hyperlipidemia, type 2 diabetes, and a remote history of tobacco use.  Past Medical History:  Diagnosis Date  . Acute on chronic systolic heart failure, NYHA class 3 ([place])   . AKI (acute kidney injury) ([place])   . Anemia   . BKA stump complication ([place])   . Blind left eye   . Blind left eye   . Cardiomyopathy, dilated ([place])   . CHF (congestive heart failure) ([place])   . Combined systolic and diastolic congestive heart failure ([place])   . Congestive dilated cardiomyopathy ([place]) 07/11/2015  . Diabetes mellitus   . Diabetic neuropathy  associated with type 2 diabetes mellitus ([place])   . Hypertension   . Neuropathy   . Noncompliance with medications   . Open knee wound 10/2015   on rt bka  . PAD (peripheral artery disease) ([place])   . Protein calorie malnutrition ([place])   . Shortness of breath dyspnea     Family History  Problem Relation Age of Onset  . Cancer Mother   . Peripheral vascular disease Father    He is unaware of any history of premature cardiovascular disease.  SOCIAL HISTORY: Social History   Socioeconomic History  . Marital status: Married    Spouse name: Not on file  . Number of children: Not on file  . Years of education: Not on file  . Highest education level: Not on file  Social Needs  . Financial resource strain: Not on file  . Food insecurity - worry: Not on file  . Food insecurity - inability: Not on file  . Transportation needs - medical: Not on file  . Transportation needs - non-medical: Not on file  Occupational History  . Not on file  Tobacco Use  . Smoking status: Former Smoker    Years: 20.00    Last attempt to quit: 05/10/2000    Years since quitting: 16.8  . Smokeless tobacco: Former Systems developer    Quit date: 02/22/2000  Substance and Sexual Activity  . Alcohol use: No  Alcohol/week: 0.0 oz  . Drug use: No  . Sexual activity: Not on file  Other Topics Concern  . Not on file  Social History Narrative   ** Merged History Encounter **       Lives alone.     No Known Allergies  Current Outpatient Medications  Medication Sig Dispense Refill  . Amino Acids-Protein Hydrolys (PRO-STAT) LIQD Take 30 mLs by mouth 2 (two) times daily.    Marland Kitchen aspirin 325 MG tablet Take 1 tablet (325 mg total) by mouth daily. 30 tablet 0  . atorvastatin (LIPITOR) 40 MG tablet Take 1 tablet (40 mg total) by mouth daily. (Patient taking differently: Take 40 mg by mouth at bedtime. ) 30 tablet 0  . bacitracin ointment Apply topically 2 (two) times daily. (Patient not taking: Reported on 01/30/2017) 120  g 0  . bisoprolol (ZEBETA) 5 MG tablet Take 1 tablet (5 mg total) by mouth daily. (Patient taking differently: Take 5 mg by mouth 2 (two) times daily. ) 30 tablet 1  . brimonidine (ALPHAGAN) 0.2 % ophthalmic solution PLACE 1 DROP INTO THE LEFT EYE 2 (TWO) TIMES DAILY. 5 mL 0  . dorzolamide-timolol (COSOPT) 22.3-6.8 MG/ML ophthalmic solution Place 1 drop into the left eye 2 (two) times daily. 10 mL 12  . levETIRAcetam (KEPPRA) 750 MG tablet Take 1 tablet (750 mg total) by mouth 2 (two) times daily. 60 tablet 0  . metFORMIN (GLUCOPHAGE) 500 MG tablet Take 1 tablet (500 mg total) by mouth 2 (two) times daily with a meal. 60 tablet 6  . Multiple Vitamins-Minerals (DECUBI-VITE) CAPS Take 1 capsule by mouth daily.    . polyethylene glycol (MIRALAX / GLYCOLAX) packet Take 17 g by mouth 2 (two) times daily. 14 each 0  . predniSONE (DELTASONE) 10 MG tablet Take 1 tablet (10 mg total) by mouth daily with breakfast. On 02/08/17 1 tablet 0  . QUEtiapine (SEROQUEL) 25 MG tablet Take 25 mg by mouth every 12 (twelve) hours.    . sacubitril-valsartan (ENTRESTO) 49-51 MG Take 1 tablet by mouth 2 (two) times daily. 60 tablet 3  . tamsulosin (FLOMAX) 0.4 MG CAPS capsule Take 2 capsules (0.8 mg total) by mouth daily after supper. 60 capsule 0  . torsemide (DEMADEX) 20 MG tablet Take 4 tablets (80 mg total) by mouth 2 (two) times daily. 240 tablet 0  . vitamin A 10000 UNIT capsule Take 10,000 Units by mouth daily.    . vitamin C (ASCORBIC ACID) 500 MG tablet Take 500 mg by mouth daily.    Marland Kitchen zinc sulfate 220 (50 Zn) MG capsule Take 220 mg by mouth daily.     No current facility-administered medications for this visit.     REVIEW OF SYSTEMS:  [X]  denotes positive finding, [ ]  denotes negative finding Cardiac  Comments:  Chest pain or chest pressure:    Shortness of breath upon exertion:    Short of breath when lying flat:    Irregular heart rhythm:        Vascular    Pain in calf, thigh, or hip brought on by  ambulation:    Pain in feet at night that wakes you up from your sleep:     Blood clot in your veins:    Leg swelling:         Pulmonary    Oxygen at home:    Productive cough:     Wheezing:         Neurologic  Sudden weakness in arms or legs:     Sudden numbness in arms or legs:     Sudden onset of difficulty speaking or slurred speech:    Temporary loss of vision in one eye:     Problems with dizziness:         Gastrointestinal    Blood in stool:     Vomited blood:         Genitourinary    Burning when urinating:     Blood in urine:        Psychiatric    Major depression:         Hematologic    Bleeding problems:    Problems with blood clotting too easily:        Skin    Rashes or ulcers: X  left leg      Constitutional    Fever or chills:     PHYSICAL EXAM:   Vitals:   03/20/17 1502  BP: 112/78  Pulse: 94  Resp: 20  Temp: 97.7 F (36.5 C)  TempSrc: Oral  SpO2: 97%  Weight: 207 lb (93.9 kg)  Height: 5\' 10"  (1.778 m)    GENERAL: The patient is a well-nourished male, in no acute distress. The vital signs are documented above. CARDIAC: There is a regular rate and rhythm.  VASCULAR: I do not detect carotid bruits. On the right side he has a palpable femoral and popliteal pulse.  He has a below the knee amputation on the right. On the left side he has a palpable femoral and popliteal pulse.  I could not palpate pedal pulses. He has a monophasic dorsalis pedis signal on the left with the Doppler and I could not obtain a posterior tibial signal. He has no significant lower extremity swelling on the left.  He does have hyperpigmentation on the left consistent with chronic venous insufficiency. PULMONARY: There is good air exchange bilaterally without wheezing or rales. ABDOMEN: Soft and non-tender with normal pitched bowel sounds.  I do not palpate an abdominal aortic aneurysm. MUSCULOSKELETAL: He has a right below the knee amputation. NEUROLOGIC: No focal  weakness or paresthesias are detected. SKIN: He has an open wound on his right below the knee amputation stump below the patella that measures 3 cm x 3 cm in diameter.  On the left leg he has a wound above the medial malleolus which measures 1.5 cm x 0.8 cm.  He has a wound overlying the left knee which measures 2 cm in diameter and a wound just below that but also measures 2 cm in diameter.  All of these wounds have reasonable granulation tissue. PSYCHIATRIC: The patient has a normal affect.  DATA:    LEFT LOWER EXTREMITY VENOUS DUPLEX: I have independently interpreted his left lower extremity venous duplex scan.  There is no evidence of DVT or superficial thrombophlebitis in the left leg.  There is no significant superficial or deep venous reflux noted.  ARTERIAL DOPPLER STUDY: I did find an arterial Doppler study that was done on 09/24/2016.  On the left side patient was noted to have a triphasic dorsalis pedis signal and a biphasic posterior tibial signal.  ABI was 1.1.  ABI could not be obtained to 0 wounds on the left calf at that time.  The patient has a right below the knee amputation.  MEDICAL ISSUES:   NONHEALING WOUNDS BOTH LOWER EXTREMITIES: This patient had a fairly normal Doppler study in July but on my exam has evidence of  tibial artery occlusive disease.  I think his disease has progressed.  Certainly this could put him at significant risk for limb loss given his nonhealing wounds, diabetes, and evidence of tibial artery occlusive disease.  This reason I recommended that we proceed with arteriography to see if there is any options for revascularization.  Hopefully he might be a candidate for endovascular approach given that open surgery would be associated with significant risk. This patient has multiple significant medical comorbidities.  These include congestive dilated cardiomyopathy with combined systolic and diastolic congestive heart failure, cardiorenal syndrome with renal failure,  history of moderate to severe pulmonary hypertension, and history of embolic stroke.  His arteriogram is scheduled for 03/25/2017. I have reviewed with the patient the indications for arteriography. In addition, I have reviewed the potential complications of arteriography including but not limited to: Bleeding, arterial injury, arterial thrombosis, dye action, renal insufficiency, or other unpredictable medical problems. I have explained to the patient that if we find disease amenable to angioplasty we could potentially address this at the same time. I have discussed the potential complications of angioplasty and stenting, including but not limited to: Bleeding, arterial thrombosis, arterial injury, dissection, or the need for surgical intervention.  He apparently has some history of chronic kidney disease although his most recent labs looked reasonable.  GFR was greater than 60.  Creatinine was 0.96.  This was on 02/07/2017.  Of note, although his venous duplex scan did not show significant reflux, certainly his exam is consistent with chronic venous insufficiency.  Regardless there is no significant reflux noted in the great saphenous vein so he is not a candidate for any type of laser ablation.  [name] [facility] [phone]

## 2017-03-25 ENCOUNTER — Encounter: Payer: Self-pay | Admitting: *Deleted

## 2017-03-25 ENCOUNTER — Ambulatory Visit (HOSPITAL_COMMUNITY)
Admission: RE | Admit: 2017-03-25 | Discharge: 2017-03-25 | Disposition: A | Discharge status: Home / Self Care | Payer: Medicaid Other | Source: Ambulatory Visit | Attending: Vascular Surgery | Admitting: Vascular Surgery

## 2017-03-25 ENCOUNTER — Ambulatory Visit (HOSPITAL_COMMUNITY)
Admission: RE | Disposition: A | Discharge status: Home / Self Care | Payer: Self-pay | Source: Ambulatory Visit | Attending: Vascular Surgery

## 2017-03-25 DIAGNOSIS — I5042 Chronic combined systolic (congestive) and diastolic (congestive) heart failure: Secondary | ICD-10-CM | POA: Diagnosis not present

## 2017-03-25 DIAGNOSIS — Z7982 Long term (current) use of aspirin: Secondary | ICD-10-CM | POA: Diagnosis not present

## 2017-03-25 DIAGNOSIS — Z8673 Personal history of transient ischemic attack (TIA), and cerebral infarction without residual deficits: Secondary | ICD-10-CM | POA: Diagnosis not present

## 2017-03-25 DIAGNOSIS — L97829 Non-pressure chronic ulcer of other part of left lower leg with unspecified severity: Secondary | ICD-10-CM | POA: Insufficient documentation

## 2017-03-25 DIAGNOSIS — I70244 Atherosclerosis of native arteries of left leg with ulceration of heel and midfoot: Secondary | ICD-10-CM | POA: Diagnosis not present

## 2017-03-25 DIAGNOSIS — N189 Chronic kidney disease, unspecified: Secondary | ICD-10-CM | POA: Insufficient documentation

## 2017-03-25 DIAGNOSIS — E1122 Type 2 diabetes mellitus with diabetic chronic kidney disease: Secondary | ICD-10-CM | POA: Diagnosis not present

## 2017-03-25 DIAGNOSIS — I420 Dilated cardiomyopathy: Secondary | ICD-10-CM | POA: Diagnosis not present

## 2017-03-25 DIAGNOSIS — Z89511 Acquired absence of right leg below knee: Secondary | ICD-10-CM | POA: Insufficient documentation

## 2017-03-25 DIAGNOSIS — E1151 Type 2 diabetes mellitus with diabetic peripheral angiopathy without gangrene: Secondary | ICD-10-CM | POA: Insufficient documentation

## 2017-03-25 DIAGNOSIS — I130 Hypertensive heart and chronic kidney disease with heart failure and stage 1 through stage 4 chronic kidney disease, or unspecified chronic kidney disease: Secondary | ICD-10-CM | POA: Diagnosis not present

## 2017-03-25 DIAGNOSIS — Z87891 Personal history of nicotine dependence: Secondary | ICD-10-CM | POA: Diagnosis not present

## 2017-03-25 DIAGNOSIS — E1140 Type 2 diabetes mellitus with diabetic neuropathy, unspecified: Secondary | ICD-10-CM | POA: Diagnosis not present

## 2017-03-25 DIAGNOSIS — H5462 Unqualified visual loss, left eye, normal vision right eye: Secondary | ICD-10-CM | POA: Diagnosis not present

## 2017-03-25 DIAGNOSIS — L97819 Non-pressure chronic ulcer of other part of right lower leg with unspecified severity: Secondary | ICD-10-CM | POA: Diagnosis not present

## 2017-03-25 DIAGNOSIS — I70248 Atherosclerosis of native arteries of left leg with ulceration of other part of lower left leg: Secondary | ICD-10-CM | POA: Diagnosis not present

## 2017-03-25 DIAGNOSIS — Z79899 Other long term (current) drug therapy: Secondary | ICD-10-CM | POA: Insufficient documentation

## 2017-03-25 DIAGNOSIS — Z7984 Long term (current) use of oral hypoglycemic drugs: Secondary | ICD-10-CM | POA: Diagnosis not present

## 2017-03-25 DIAGNOSIS — Z7952 Long term (current) use of systemic steroids: Secondary | ICD-10-CM | POA: Insufficient documentation

## 2017-03-25 HISTORY — PX: ABDOMINAL AORTOGRAM W/LOWER EXTREMITY: CATH118223

## 2017-03-25 LAB — POCT I-STAT, CHEM 8
BUN: 31 mg/dL — ABNORMAL HIGH (ref 6–20)
Calcium, Ion: 1.19 mmol/L (ref 1.15–1.40)
Chloride: 100 mmol/L — ABNORMAL LOW (ref 101–111)
Creatinine, Ser: 1 mg/dL (ref 0.61–1.24)
Glucose, Bld: 164 mg/dL — ABNORMAL HIGH (ref 65–99)
HCT: 45 % (ref 39.0–52.0)
Hemoglobin: 15.3 g/dL (ref 13.0–17.0)
Potassium: 3.5 mmol/L (ref 3.5–5.1)
Sodium: 143 mmol/L (ref 135–145)
TCO2: 32 mmol/L (ref 22–32)

## 2017-03-25 SURGERY — ABDOMINAL AORTOGRAM W/LOWER EXTREMITY
Anesthesia: LOCAL

## 2017-03-25 MED ORDER — MIDAZOLAM HCL 2 MG/2ML IJ SOLN
INTRAMUSCULAR | Status: DC | PRN
  Administered 2017-03-25: 0.5 mg via INTRAVENOUS

## 2017-03-25 MED ORDER — SODIUM CHLORIDE 0.9 % IV SOLN: 250.0000 mL | INTRAVENOUS | Status: DC | PRN

## 2017-03-25 MED ORDER — SODIUM CHLORIDE 0.9% FLUSH: 3.0000 mL | Freq: Two times a day (BID) | INTRAVENOUS | Status: DC

## 2017-03-25 MED ORDER — FENTANYL CITRATE (PF) 100 MCG/2ML IJ SOLN
INTRAMUSCULAR | Status: DC | PRN
  Administered 2017-03-25: 25 ug via INTRAVENOUS

## 2017-03-25 MED ORDER — LIDOCAINE HCL (PF) 1 % IJ SOLN
INTRAMUSCULAR | Status: DC | PRN
  Administered 2017-03-25: 20 mL

## 2017-03-25 MED ORDER — MIDAZOLAM HCL 2 MG/2ML IJ SOLN
INTRAMUSCULAR | Status: AC
  Filled 2017-03-25: qty 2

## 2017-03-25 MED ORDER — IODIXANOL 320 MG/ML IV SOLN
INTRAVENOUS | Status: DC | PRN
  Administered 2017-03-25: 90 mL via INTRA_ARTERIAL

## 2017-03-25 MED ORDER — HYDRALAZINE HCL 20 MG/ML IJ SOLN
INTRAMUSCULAR | Status: DC | PRN
  Administered 2017-03-25: 10 mg via INTRAVENOUS

## 2017-03-25 MED ORDER — HEPARIN (PORCINE) IN NACL 2-0.9 UNIT/ML-% IJ SOLN
INTRAMUSCULAR | Status: AC
  Filled 2017-03-25: qty 1000

## 2017-03-25 MED ORDER — FENTANYL CITRATE (PF) 100 MCG/2ML IJ SOLN
INTRAMUSCULAR | Status: AC
  Filled 2017-03-25: qty 2

## 2017-03-25 MED ORDER — SODIUM CHLORIDE 0.9 % WEIGHT BASED INFUSION: 1.0000 mL/kg/h | INTRAVENOUS | Status: DC

## 2017-03-25 MED ORDER — HEPARIN (PORCINE) IN NACL 2-0.9 UNIT/ML-% IJ SOLN
INTRAMUSCULAR | Status: AC | PRN
  Administered 2017-03-25: 1000 mL via INTRA_ARTERIAL

## 2017-03-25 MED ORDER — SODIUM CHLORIDE 0.9% FLUSH: 3.0000 mL | INTRAVENOUS | Status: DC | PRN

## 2017-03-25 MED ORDER — SODIUM CHLORIDE 0.9 % IV SOLN
INTRAVENOUS | Status: DC
  Administered 2017-03-25: 07:00:00 via INTRAVENOUS

## 2017-03-25 MED ORDER — HYDRALAZINE HCL 20 MG/ML IJ SOLN: 5.0000 mg | INTRAMUSCULAR | Status: DC | PRN

## 2017-03-25 MED ORDER — LABETALOL HCL 5 MG/ML IV SOLN: 10.0000 mg | INTRAVENOUS | Status: DC | PRN

## 2017-03-25 MED ORDER — LIDOCAINE HCL 1 % IJ SOLN
INTRAMUSCULAR | Status: AC
  Filled 2017-03-25: qty 20

## 2017-03-25 MED ORDER — HYDRALAZINE HCL 20 MG/ML IJ SOLN
INTRAMUSCULAR | Status: AC
  Filled 2017-03-25: qty 1

## 2017-03-25 SURGICAL SUPPLY — 14 items
CATH ANGIO 5F PIGTAIL 65CM (CATHETERS) ×1 IMPLANT
CATH SOFT-VU 4F 65 STRAIGHT (CATHETERS) IMPLANT
CATH SOFT-VU STRAIGHT 4F 65CM (CATHETERS) ×2
CATH TEMPO 5F RIM 65CM (CATHETERS) ×1 IMPLANT
DEVICE TORQUE .025-.038 (MISCELLANEOUS) ×1 IMPLANT
GUIDEWIRE ANGLED .035X150CM (WIRE) ×1 IMPLANT
KIT MICROINTRODUCER STIFF 5F (SHEATH) ×1 IMPLANT
KIT PV (KITS) ×2 IMPLANT
SHEATH PINNACLE 5F 10CM (SHEATH) ×1 IMPLANT
SYR MEDRAD MARK V 150ML (SYRINGE) ×2 IMPLANT
TRANSDUCER W/STOPCOCK (MISCELLANEOUS) ×2 IMPLANT
TRAY PV CATH (CUSTOM PROCEDURE TRAY) ×2 IMPLANT
TUBING HIGH PRESSURE 120CM (CONNECTOR) ×1 IMPLANT
WIRE BENTSON .035X145CM (WIRE) ×1 IMPLANT

## 2017-03-25 NOTE — Discharge Instructions (Signed)

## 2017-03-25 NOTE — Interval H&P Note (Signed)
History and Physical Interval Note:  03/25/2017 9:16 AM  [name]  has presented today for surgery, with the diagnosis of PVD w/ ulcers  The various methods of treatment have been discussed with the patient and family. After consideration of risks, benefits and other options for treatment, the patient has consented to  Procedure(s): ABDOMINAL AORTOGRAM W/LOWER EXTREMITY (N/A) as a surgical intervention .  The patient's history has been reviewed, patient examined, no change in status, stable for surgery.  I have reviewed the patient's chart and labs.  Questions were answered to the patient's satisfaction.     [name]

## 2017-03-25 NOTE — Progress Notes (Signed)
Jake Mould, MD sent to [facility] Charge [facility]         PROCEDURE:    1. Conscious sedation  2. Ultrasound-guided access to the right common femoral artery  3. Aortogram with bilateral iliac arteriogram  4. Selective catheterization of the left external iliac artery with left lower extremity runoff  5. Retrograde right femoral arteriogram    SURGEON: Judeth Cornfield. Scot Dock, MD, FACS    ANESTHESIA: Local with sedation   He will continue with aggressive wound care. There are no surgical revascularization options. I will see him back as needed.     Documentation that patient has no vascular surgery options.

## 2017-03-25 NOTE — Op Note (Signed)
   PATIENT: [name]   MRN: [id number] DOB: [birth year]    DATE OF PROCEDURE: 03/25/2017  INDICATIONS:    [name] is a 62 y.o. male who presents with a nonhealing wound of the left medial malleolus.  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  PROCEDURE:    1.  Conscious sedation 2.  Ultrasound-guided access to the right common femoral artery 3.  Aortogram with bilateral iliac arteriogram 4.  Selective catheterization of the left external iliac artery with left lower extremity runoff 5.  Retrograde right femoral arteriogram  SURGEON: Judeth Cornfield. Scot Dock, MD, FACS  ANESTHESIA: Local with sedation  EBL: Minimal  TECHNIQUE: The patient was taken to the peripheral vascular lab and was sedated. The period of conscious sedation was 33 minutes.  During that time period, I was present face-to-face 100% of the time.  The patient was administered half a milligram of Versed and 25 mcg of fentanyl. The patient's heart rate, blood pressure, and oxygen saturation were monitored by the nurse continuously during the procedure.  Both groins were prepped and draped in the usual sterile fashion.  Under ultrasound guidance, after the skin was anesthetized, the right common femoral artery was cannulated this was exchanged for a 5 French sheath over a Bentson wire.  The pigtail catheter was positioned at the L1 vertebral body and flush aortogram obtained.  Catheter was positioned above the aortic bifurcation and an oblique iliac projection was obtained.  I then exchanged the pigtail catheter for a rim catheter which was positioned into the left common iliac artery.  An angled Glidewire was advanced into the external iliac artery and the rim catheter exchanged for a straight catheter.  Selective left external iliac arteriogram was obtained with left lower extremity runoff.  The catheter was then removed and a retrograde right femoral arteriogram obtained  with right lower extremity runoff.  The sheath was removed and pressure held for hemostasis.  No immediate complications were noted.  FINDINGS:   1.  There are single renal arteries bilaterally with no significant renal artery stenosis identified.  The infrarenal aorta, bilateral common iliac arteries, external iliac arteries, and hypogastric arteries are patent. 2.  On the left side, the common femoral, deep femoral, superficial femoral, popliteal, anterior tibial arteries are all patent.  The dorsalis pedis is patent.  There is a tight stenosis in the tibial peroneal trunk and then the peroneal artery is small and diffusely diseased.  The posterior tibial artery is occluded. 3.  On the right side, the patient has a right below the knee amputation.  Common femoral, deep femoral, superficial femoral, and popliteal arteries are patent.  CLINICAL NOTE: I do not see any endovascular options that would significantly impact the circulation to the medial malleolus.  He will continue aggressive wound care for both the left medial malleolar wound and the right pretibial area.  If the right pretibial wound fails to heal he could potentially require an above-the-knee amputation on the right.  [name], MD, FACS [facility]  DATE OF DICTATION:   03/25/2017

## 2017-03-26 ENCOUNTER — Encounter (HOSPITAL_COMMUNITY): Payer: Self-pay | Admitting: Vascular Surgery

## 2017-03-29 MED FILL — Lidocaine HCl Local Inj 1%: INTRAMUSCULAR | Qty: 20 | Status: AC

## 2017-04-02 ENCOUNTER — Encounter (HOSPITAL_COMMUNITY): Payer: Self-pay | Admitting: Vascular Surgery

## 2017-04-05 ENCOUNTER — Emergency Department (HOSPITAL_COMMUNITY)
Admission: EM | Admit: 2017-04-05 | Discharge: 2017-04-05 | Disposition: A | Discharge status: Home / Self Care | Payer: Medicaid Other | Attending: Emergency Medicine | Admitting: Emergency Medicine

## 2017-04-05 ENCOUNTER — Other Ambulatory Visit: Payer: Self-pay

## 2017-04-05 ENCOUNTER — Emergency Department (HOSPITAL_COMMUNITY): Payer: Medicaid Other

## 2017-04-05 ENCOUNTER — Encounter (HOSPITAL_COMMUNITY): Payer: Self-pay

## 2017-04-05 DIAGNOSIS — R569 Unspecified convulsions: Secondary | ICD-10-CM | POA: Insufficient documentation

## 2017-04-05 DIAGNOSIS — I739 Peripheral vascular disease, unspecified: Secondary | ICD-10-CM | POA: Insufficient documentation

## 2017-04-05 DIAGNOSIS — I5023 Acute on chronic systolic (congestive) heart failure: Secondary | ICD-10-CM | POA: Diagnosis not present

## 2017-04-05 DIAGNOSIS — Z87891 Personal history of nicotine dependence: Secondary | ICD-10-CM | POA: Diagnosis not present

## 2017-04-05 DIAGNOSIS — Z79899 Other long term (current) drug therapy: Secondary | ICD-10-CM | POA: Insufficient documentation

## 2017-04-05 DIAGNOSIS — N183 Chronic kidney disease, stage 3 (moderate): Secondary | ICD-10-CM | POA: Insufficient documentation

## 2017-04-05 DIAGNOSIS — Z89512 Acquired absence of left leg below knee: Secondary | ICD-10-CM | POA: Insufficient documentation

## 2017-04-05 DIAGNOSIS — Z7984 Long term (current) use of oral hypoglycemic drugs: Secondary | ICD-10-CM | POA: Diagnosis not present

## 2017-04-05 DIAGNOSIS — E1122 Type 2 diabetes mellitus with diabetic chronic kidney disease: Secondary | ICD-10-CM | POA: Insufficient documentation

## 2017-04-05 DIAGNOSIS — I130 Hypertensive heart and chronic kidney disease with heart failure and stage 1 through stage 4 chronic kidney disease, or unspecified chronic kidney disease: Secondary | ICD-10-CM | POA: Diagnosis not present

## 2017-04-05 LAB — COMPREHENSIVE METABOLIC PANEL
ALT: 32 U/L (ref 17–63)
AST: 30 U/L (ref 15–41)
Albumin: 3.3 g/dL — ABNORMAL LOW (ref 3.5–5.0)
Alkaline Phosphatase: 219 U/L — ABNORMAL HIGH (ref 38–126)
Anion gap: 11 (ref 5–15)
BUN: 40 mg/dL — ABNORMAL HIGH (ref 6–20)
CO2: 30 mmol/L (ref 22–32)
Calcium: 9 mg/dL (ref 8.9–10.3)
Chloride: 97 mmol/L — ABNORMAL LOW (ref 101–111)
Creatinine, Ser: 1.24 mg/dL (ref 0.61–1.24)
GFR calc Af Amer: >60 mL/min (ref >60)
GFR calc non Af Amer: >60 mL/min (ref >60)
Glucose, Bld: 293 mg/dL — ABNORMAL HIGH (ref 65–99)
Potassium: 3.5 mmol/L (ref 3.5–5.1)
Sodium: 138 mmol/L (ref 135–145)
Total Bilirubin: 0.7 mg/dL (ref 0.3–1.2)
Total Protein: 7 g/dL (ref 6.5–8.1)

## 2017-04-05 LAB — CBC
HCT: 40.8 % (ref 39.0–52.0)
Hemoglobin: 13.6 g/dL (ref 13.0–17.0)
MCH: 26.7 pg (ref 26.0–34.0)
MCHC: 33.3 g/dL (ref 30.0–36.0)
MCV: 80.2 fL (ref 78.0–100.0)
Platelets: 251 10*3/uL (ref 150–400)
RBC: 5.09 MIL/uL (ref 4.22–5.81)
RDW: 17.4 % — ABNORMAL HIGH (ref 11.5–15.5)
WBC: 9.9 10*3/uL (ref 4.0–10.5)

## 2017-04-05 LAB — BLOOD GAS, ARTERIAL
Acid-Base Excess: 5.9 mmol/L — ABNORMAL HIGH (ref 0.0–2.0)
Bicarbonate: 30.5 mmol/L — ABNORMAL HIGH (ref 20.0–28.0)
Drawn by: 295031
FIO2: 21
O2 Saturation: 94.2 %
Patient temperature: 98.6
pCO2 arterial: 45.9 mmHg (ref 32.0–48.0)
pH, Arterial: 7.438 (ref 7.350–7.450)
pO2, Arterial: 75.8 mmHg — ABNORMAL LOW (ref 83.0–108.0)

## 2017-04-05 LAB — ETHANOL: Alcohol, Ethyl (B): <10 mg/dL (ref <10)

## 2017-04-05 LAB — AMMONIA: Ammonia: 33 umol/L (ref 9–35)

## 2017-04-05 LAB — CBG MONITORING, ED: Glucose-Capillary: 276 mg/dL — ABNORMAL HIGH (ref 65–99)

## 2017-04-05 NOTE — ED Provider Notes (Signed)
[facility] [place] DEPT Provider Note   CSN: [id number] Arrival date & time: 04/05/17  1206     History   Chief Complaint Chief Complaint  Patient presents with  . Seizures    HPI KYLEN SCHLIEP is a 62 y.o. male.  HPI 62 year old male who presents to the emergency department complaints of possible seizure-like activity.  He recently admitted the hospital was thought to be having partial seizures.  His EEG at that time was without seizure-like waveforms.  He is on Keppra 750 twice daily.  He has been compliant with this.  No seizure-like activity has thus far been noted in the emergency department.  Staff reported that the episodes lasted 5-6 seconds.  staff reportedly gave Haldol and Ativan with no relief.  Patient without significant complaints at this time.  He does seem somewhat sleepy although this is likely secondary to the Haldol.  He awakens and follows simple commands.   Past Medical History:  Diagnosis Date  . Acute on chronic systolic heart failure, NYHA class 3 ([place])   . AKI (acute kidney injury) ([place])   . Anemia   . BKA stump complication ([place])   . Blind left eye   . Blind left eye   . 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 CHF (congestive heart failure) ([place])   . Combined systolic and diastolic congestive heart failure ([place])   . Congestive dilated cardiomyopathy ([place]) 07/11/2015  . Diabetes mellitus   . Diabetic neuropathy associated with type 2 diabetes mellitus ([place])   . Hypertension   . Neuropathy   . Noncompliance with medications   . Open knee wound 10/2015   on rt bka  . PAD (peripheral artery disease) ([place])   . Protein calorie malnutrition ([place])   . Shortness of breath dyspnea     Patient Active Problem List   Diagnosis Date Noted  . Irregular heart rate   . Acute encephalopathy 01/31/2017  . Cellulitis of right upper extremity   . Anasarca   . Altered mental status   . Slurred speech   . Hypothermia 01/30/2017  . Pressure  injury of skin 12/21/2016  . CHF exacerbation ([place]) 12/20/2016  . Peripheral edema   . Benign prostatic hyperplasia with lower urinary tract symptoms   . Embolic stroke ([place]) 70/26/3785  . 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 Acute on chronic systolic heart failure, NYHA class 3 ([place]) 10/19/2016  . Moderate to severe pulmonary hypertension ([place]) 10/19/2016  . Severe tricuspid regurgitation by prior echocardiogram 10/19/2016  . Diabetes mellitus type 2 in obese ([place]) 10/19/2016  . HLD (hyperlipidemia) 10/19/2016  . Seizure ([place]) 10/19/2016  . Fluid overload 10/14/2016  . 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 Cardiorenal syndrome with renal failure 09/28/2016  . Acute urinary retention 09/26/2016  . Acute on chronic combined systolic and diastolic CHF (congestive heart failure) ([place]) 09/26/2016  . Scrotal edema 09/24/2016  . Multiple open wounds of lower leg, initial encounter 09/24/2016  . Tinea of groin 09/24/2016  . Chronic kidney disease (CKD), stage III (moderate) ([place]) 06/07/2016  . Chronic combined systolic and diastolic congestive heart failure ([place])   . Protein calorie malnutrition ([place]) 07/12/2015  . Congestive dilated cardiomyopathy ([place]) 07/11/2015  . Pressure ulcer 07/09/2015  . Open knee wound 07/06/2015  . History of right below knee amputation ([place]) 02/22/2015  . PAD (peripheral artery disease) ([place]) 02/22/2015  . Blindness of left eye 01/04/2015  .  Complications, amputation stump late ([place]) 08/06/2014  . Diabetic neuropathy associated with type 2 diabetes mellitus ([place]) 12/01/2013  . Noncompliance with medications 07/13/2010  . OTHER SPEC TYPES SCHIZOPHRENIA UNSPEC CONDITION 05/24/2009  . Obesity, unspecified 06/09/2007  . DM (diabetes mellitus), type 2, uncontrolled ([place]) 05/02/2006  . Essential hypertension 05/02/2006    Past Surgical History:  Procedure Laterality Date  .  ABDOMINAL AORTOGRAM W/LOWER EXTREMITY N/A 03/25/2017   Procedure: ABDOMINAL AORTOGRAM W/LOWER EXTREMITY;  Surgeon: Angelia Mould, MD;  Location: [place] [facility];  Service: Cardiovascular;  Laterality: N/A;  . AMPUTATION Right 09/26/2013   Procedure: AMPUTATION BELOW KNEE;  Surgeon: Rozanna Box, MD;  Location: [facility];  Service: Orthopedics;  Laterality: Right;  . AMPUTATION Right 05/01/2015   Procedure: REVISION OF RIGHT TRANSTIBIAL AMPUTATION ;  Surgeon: Newt Minion, MD;  Location: [place];  Service: Orthopedics;  Laterality: Right;  . APPLICATION OF WOUND VAC  08/10/2016   Procedure: APPLICATION OFI NCISONAL  WOUND VAC;  Surgeon: Newt Minion, MD;  Location: [place];  Service: Orthopedics;;  . CARDIAC CATHETERIZATION N/A 07/13/2015   Procedure: Right/Left Heart Cath and Coronary Angiography;  Surgeon: Jolaine Artist, MD;  Location: [place] [facility];  Service: Cardiovascular;  Laterality: N/A;  . COLONOSCOPY    . I&D EXTREMITY Right 09/24/2013   Procedure: IRRIGATION AND DEBRIDEMENT RIGHT FOOT ULCER;  Surgeon: Renette Butters, MD;  Location: [facility];  Service: Orthopedics;  Laterality: Right;  . I&D EXTREMITY Right 09/26/2013   Procedure: Repeat IRRIGATION AND DEBRIDEMENT Right Foot Ulcer;  Surgeon: Rozanna Box, MD;  Location: [facility];  Service: Orthopedics;  Laterality: Right;  . MULTIPLE TOOTH EXTRACTIONS    . SKIN SPLIT GRAFT Right 08/10/2016   Procedure: SKIN GRAFT SPLIT THICKNESS RIGHT LEG;  Surgeon: Newt Minion, MD;  Location: [facility];  Service: Orthopedics;  Laterality: Right;  . STUMP REVISION Right 04/20/2015   Procedure: Revision Right Below Knee Amputation, Apply Wound VAC;  Surgeon: Newt Minion, MD;  Location: [facility];  Service: Orthopedics;  Laterality: Right;  . TONSILLECTOMY         Home Medications    Prior to Admission medications   Medication Sig Start Date End Date Taking? Authorizing Provider  Amino Acids-Protein Hydrolys (PRO-STAT) LIQD Take 30 mLs by  mouth 2 (two) times daily.   Yes [provider]  aspirin 325 MG tablet Take 1 tablet (325 mg total) by mouth daily. 10/21/16  Yes Sheikh, Omair Latif, DO  atorvastatin (LIPITOR) 40 MG tablet Take 1 tablet (40 mg total) by mouth daily. Patient taking differently: Take 40 mg by mouth at bedtime.  10/19/16  Yes Hosie Poisson, MD  atorvastatin (LIPITOR) 40 MG tablet Take 40 mg by mouth at bedtime.  08/25/15  Yes [provider]  bisoprolol (ZEBETA) 5 MG tablet Take 1 tablet (5 mg total) by mouth daily. 10/19/16  Yes Hosie Poisson, MD  brimonidine (ALPHAGAN) 0.2 % ophthalmic solution PLACE 1 DROP INTO THE LEFT EYE 2 (TWO) TIMES DAILY. 06/15/15  Yes Mariel Aloe, MD  dorzolamide-timolol (COSOPT) 22.3-6.8 MG/ML ophthalmic solution Place 1 drop into the left eye 2 (two) times daily. 09/28/13  Yes Virginia Crews, MD  levETIRAcetam (KEPPRA) 750 MG tablet Take 1 tablet (750 mg total) by mouth 2 (two) times daily. 10/20/16  Yes Sheikh, Omair Latif, DO  metFORMIN (GLUCOPHAGE) 850 MG tablet Take 850 mg by mouth 2 (two) times daily with a meal.   Yes  [provider]  Multiple Vitamins-Minerals (DECUBI-VITE) CAPS Take 1 capsule by mouth daily.   Yes [provider]  polyethylene glycol (MIRALAX / GLYCOLAX) packet Take 17 g by mouth 2 (two) times daily. 02/07/17  Yes Tammi Klippel, Sherin, DO  QUEtiapine (SEROQUEL) 25 MG tablet Take 25 mg by mouth every 12 (twelve) hours.   Yes [provider]  sacubitril-valsartan (ENTRESTO) 49-51 MG Take 1 tablet by mouth 2 (two) times daily. 08/23/16  Yes Arbutus Leas, NP  tamsulosin (FLOMAX) 0.4 MG CAPS capsule Take 2 capsules (0.8 mg total) by mouth daily after supper. 12/31/16  Yes Tammi Klippel, Sherin, DO  torsemide (DEMADEX) 20 MG tablet Take 4 tablets (80 mg total) by mouth 2 (two) times daily. 02/07/17  Yes Tammi Klippel, Sherin, DO  bacitracin ointment Apply topically 2 (two) times daily. Patient not taking: Reported on 03/21/2017 10/18/16    Hosie Poisson, MD  predniSONE (DELTASONE) 10 MG tablet Take 1 tablet (10 mg total) by mouth daily with breakfast. On 02/08/17 Patient not taking: Reported on 04/05/2017 02/08/17   Caroline More, DO    Family History Family History  Problem Relation Age of Onset  . Cancer Mother   . Peripheral vascular disease Father     Social History Social History   Tobacco Use  . Smoking status: Former Smoker    Years: 20.00    Last attempt to quit: 05/10/2000    Years since quitting: 16.9  . Smokeless tobacco: Former Systems developer    Quit date: 02/22/2000  Substance Use Topics  . Alcohol use: No    Alcohol/week: 0.0 oz  . Drug use: No     Allergies   Patient has no known allergies.   Review of Systems Review of Systems  All other systems reviewed and are negative.    Physical Exam Updated Vital Signs BP (!) 135/101   Pulse 90   Temp 98 F (36.7 C) (Oral)   Resp 13   Ht 6' (1.829 m)   Wt 88.5 kg (195 lb)   SpO2 95%   BMI 26.45 kg/m   Physical Exam  Constitutional: He appears well-developed and well-nourished.  HENT:  Head: Normocephalic.  Eyes: EOM are normal.  Neck: Normal range of motion.  Cardiovascular: Normal rate and regular rhythm.  Pulmonary/Chest: Effort normal and breath sounds normal.  Abdominal: Soft. He exhibits no distension.  Musculoskeletal: Normal range of motion.  Neurological:  Awakens easily to voice.  Answers simple questions.  Follows simple commands.  Skin: Skin is warm.  Psychiatric: He has a normal mood and affect.  Nursing note and vitals reviewed.    ED Treatments / Results  Labs (all labs ordered are listed, but only abnormal results are displayed) Labs Reviewed  CBC - Abnormal; Notable for the following components:      Result Value   RDW 17.4 (*)    All other components within normal limits  COMPREHENSIVE METABOLIC PANEL - Abnormal; Notable for the following components:   Chloride 97 (*)    Glucose, Bld 293 (*)    BUN 40 (*)     Albumin 3.3 (*)    Alkaline Phosphatase 219 (*)    All other components within normal limits  BLOOD GAS, ARTERIAL - Abnormal; Notable for the following components:   pO2, Arterial 75.8 (*)    Bicarbonate 30.5 (*)    Acid-Base Excess 5.9 (*)    All other components within normal limits  CBG MONITORING, ED - Abnormal; Notable for the following components:  Glucose-Capillary 276 (*)    All other components within normal limits  ETHANOL  AMMONIA  URINALYSIS, ROUTINE W REFLEX MICROSCOPIC  RAPID URINE DRUG SCREEN, HOSP PERFORMED    EKG  EKG Interpretation None       Radiology Ct Head Wo Contrast  Result Date: 04/05/2017 CLINICAL DATA:  "Per EMS- patient is from [facility]. Staff reported that the patient has had 8-10 seizures since 0900 today. Staff also reported that the patient was able to speak and was alert during seizures and the seizures EXAM: CT HEAD WITHOUT CONTRAST TECHNIQUE: Contiguous axial images were obtained from the base of the skull through the vertex without intravenous contrast. COMPARISON:  PET-CT 01/30/2017, brain MRI 02/01/2017 FINDINGS: Brain: Remote cortical infarction in the RIGHT frontal lobe and LEFT parietal lobe. No acute intracranial hemorrhage. No focal mass lesion. No CT evidence of acute infarction. No midline shift or mass effect. No hydrocephalus. Basilar cisterns are patent. Vascular: No hyperdense vessel or unexpected calcification. Skull: Normal. Negative for fracture or focal lesion. Sinuses/Orbits: Paranasal sinuses and mastoid air cells are clear. Orbits are clear. Other: None. IMPRESSION: 1. No acute intracranial findings.  No change from comparison MRI. 2. Remote cortical and subcortical infarctions. Electronically Signed   By: Suzy Bouchard M.D.   On: 04/05/2017 16:17    Procedures Procedures (including critical care time)  Medications Ordered in ED Medications - No data to display   Initial Impression / Assessment and Plan / ED  Course  I have reviewed the triage vital signs and the nursing notes.  Pertinent labs & imaging results that were available during my care of the patient were reviewed by me and considered in my medical decision making (see chart for details).     Overall well-appearing.  Patient is more alert at this time.  This was likely secondary to the Haldol and Ativan given by staff at the facility.  Workup in the emergency department is without abnormality.  Patient has had no recurrent seizure-like activity here.  Discharged home.  Outpatient neurology and primary care follow-up.  Patient will continue his Keppra.  Final Clinical Impressions(s) / ED Diagnoses   Final diagnoses:  Seizure-like activity [facility])    ED Discharge Orders    None       Jola Schmidt, MD 04/05/17 [id number]

## 2017-04-05 NOTE — ED Triage Notes (Signed)
Per EMS- patient is from [facility]. Staff reported that the patient has had 8-10 seizures since 0900 today. Staff also reported that the patient was able to speak and was alert during seizures and the seizures lasted approx 5-6 seconds. Staff gaave Haldol and Ativan with no relief.

## 2017-04-05 NOTE — ED Notes (Signed)
Bed: AS60 Expected date:  Expected time:  Means of arrival:  Comments: 62 yo seizures?

## 2017-05-06 ENCOUNTER — Encounter: Payer: Self-pay | Admitting: Neurology

## 2017-05-06 ENCOUNTER — Ambulatory Visit: Payer: Medicaid Other | Admitting: Neurology

## 2017-05-06 DIAGNOSIS — G40109 Localization-related (focal) (partial) symptomatic epilepsy and epileptic syndromes with simple partial seizures, not intractable, without status epilepticus: Secondary | ICD-10-CM

## 2017-05-06 HISTORY — DX: Localization-related (focal) (partial) symptomatic epilepsy and epileptic syndromes with simple partial seizures, not intractable, without status epilepticus: G40.109

## 2017-05-06 NOTE — Progress Notes (Signed)
Reason for visit: Seizures  Referring physician: Dr. Elyse Hsu is a 62 y.o. male  History of present illness:  [name] is a 62 year old black male with a history of cerebrovascular disease.  The patient has had bihemispheric cortical strokes that affected the right and left parietal areas and the left frontal region.  The patient has had events of what sounds like left body focal seizures that have been occurring since 19 October 2016.  The patient was placed on Keppra at that point but apparently he only took the medication for a couple weeks and then stopped.  The patient has had recurring seizure events that occurred on the first and 09 April 2017.  The patient has gone back on the [place], he has not had any further seizure events over the last 3 weeks.  He lives in an extended care facility, [facility].  The patient is currently on 750 mg of Keppra twice daily.  He claims that he is tolerating the medication well without drowsiness or irritability.  He has diabetes and a diabetic neuropathy, he has a right below-knee amputation and currently he is not ambulatory.  He has blindness of the left eye.  The patient does not operate a motor vehicle.  He comes in today with his mother, she claims that she has witnessed a seizure event, his left arm and left leg will jerk, the patient will not lose consciousness and he will be able to talk during the event.  The episode may last 2-3 minutes but may recur several times during the day.  The patient denies any new numbness or weakness of the face, arms, or legs.  He last had an event on 09 April 2017 while at the office of an ophthalmologist, he was noted to have hypotension with a blood pressure in the 23F systolic range.  Past Medical History:  Diagnosis Date  . Acute on chronic systolic heart failure, NYHA class 3 ([place])   . AKI (acute kidney injury) ([place])   . Anemia   . BKA stump complication ([place])   .  Blind left eye   . Blind left eye   . Cardiomyopathy, dilated ([place])   . CHF (congestive heart failure) ([place])   . Combined systolic and diastolic congestive heart failure ([place])   . Congestive dilated cardiomyopathy ([place]) 07/11/2015  . Diabetes mellitus   . Diabetic neuropathy associated with type 2 diabetes mellitus ([place])   . 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 Hypertension   . Neuropathy   . Noncompliance with medications   . Open knee wound 10/2015   on rt bka  . PAD (peripheral artery disease) ([place])   . Protein calorie malnutrition ([place])   . Shortness of breath dyspnea     Past Surgical History:  Procedure Laterality Date  . ABDOMINAL AORTOGRAM W/LOWER EXTREMITY N/A 03/25/2017   Procedure: ABDOMINAL AORTOGRAM W/LOWER EXTREMITY;  Surgeon: Angelia Mould, MD;  Location: [facility];  Service: Cardiovascular;  Laterality: N/A;  . AMPUTATION Right 09/26/2013   Procedure: AMPUTATION BELOW KNEE;  Surgeon: Rozanna Box, MD;  Location: [facility];  Service: Orthopedics;  Laterality: Right;  . AMPUTATION Right 05/01/2015   Procedure: REVISION OF RIGHT TRANSTIBIAL AMPUTATION ;  Surgeon: Newt Minion, MD;  Location: [facility];  Service: Orthopedics;  Laterality: Right;  . APPLICATION OF WOUND VAC  08/10/2016   Procedure: APPLICATION OFI NCISONAL  WOUND  VAC;  Surgeon: Newt Minion, MD;  Location: [facility];  Service: Orthopedics;;  . CARDIAC CATHETERIZATION N/A 07/13/2015   Procedure: Right/Left Heart Cath and Coronary Angiography;  Surgeon: Jolaine Artist, MD;  Location: [facility];  Service: Cardiovascular;  Laterality: N/A;  . COLONOSCOPY    . I&D EXTREMITY Right 09/24/2013   Procedure: IRRIGATION AND DEBRIDEMENT RIGHT FOOT ULCER;  Surgeon: Renette Butters, MD;  Location: [facility];  Service: Orthopedics;  Laterality: Right;  . I&D EXTREMITY Right 09/26/2013   Procedure: Repeat IRRIGATION AND DEBRIDEMENT Right Foot Ulcer;  Surgeon: Rozanna Box,  MD;  Location: [facility];  Service: Orthopedics;  Laterality: Right;  . MULTIPLE TOOTH EXTRACTIONS    . SKIN SPLIT GRAFT Right 08/10/2016   Procedure: SKIN GRAFT SPLIT THICKNESS RIGHT LEG;  Surgeon: Newt Minion, MD;  Location: [facility];  Service: Orthopedics;  Laterality: Right;  . STUMP REVISION Right 04/20/2015   Procedure: Revision Right Below Knee Amputation, Apply Wound VAC;  Surgeon: Newt Minion, MD;  Location: [facility];  Service: Orthopedics;  Laterality: Right;  . TONSILLECTOMY      Family History  Problem Relation Age of Onset  . Cancer Mother   . Peripheral vascular disease Father     Social history:  reports that he quit smoking about 17 years ago. He quit after 20.00 years of use. He quit smokeless tobacco use about 17 years ago. He reports that he does not drink alcohol or use drugs.  Medications:  Prior to Admission medications   Medication Sig Start Date End Date Taking? Authorizing Provider  Amino Acids-Protein Hydrolys (PRO-STAT) LIQD Take 30 mLs by mouth 2 (two) times daily.   Yes [provider]  aspirin 325 MG tablet Take 1 tablet (325 mg total) by mouth daily. 10/21/16  Yes Sheikh, Omair Latif, DO  atorvastatin (LIPITOR) 40 MG tablet Take 40 mg by mouth at bedtime.  08/25/15  Yes [provider]  bisoprolol (ZEBETA) 5 MG tablet Take 1 tablet (5 mg total) by mouth daily. 10/19/16  Yes Hosie Poisson, MD  brimonidine (ALPHAGAN) 0.2 % ophthalmic solution PLACE 1 DROP INTO THE LEFT EYE 2 (TWO) TIMES DAILY. 06/15/15  Yes Mariel Aloe, MD  dorzolamide-timolol (COSOPT) 22.3-6.8 MG/ML ophthalmic solution Place 1 drop into the left eye 2 (two) times daily. 09/28/13  Yes Virginia Crews, MD  levETIRAcetam (KEPPRA) 750 MG tablet Take 1 tablet (750 mg total) by mouth 2 (two) times daily. 10/20/16  Yes Sheikh, Omair Latif, DO  metFORMIN (GLUCOPHAGE) 850 MG tablet Take 850 mg by mouth 2 (two) times daily with a meal.   Yes [provider]  Multiple  Vitamins-Minerals (DECUBI-VITE) CAPS Take 1 capsule by mouth daily.   Yes [provider]  polyethylene glycol (MIRALAX / GLYCOLAX) packet Take 17 g by mouth 2 (two) times daily. 02/07/17  Yes Tammi Klippel, Sherin, DO  QUEtiapine (SEROQUEL) 25 MG tablet Take 25 mg by mouth every 12 (twelve) hours.   Yes [provider]  sacubitril-valsartan (ENTRESTO) 49-51 MG Take 1 tablet by mouth 2 (two) times daily. 08/23/16  Yes Arbutus Leas, NP  tamsulosin (FLOMAX) 0.4 MG CAPS capsule Take 2 capsules (0.8 mg total) by mouth daily after supper. 12/31/16  Yes Tammi Klippel, Sherin, DO  torsemide (DEMADEX) 20 MG tablet Take 4 tablets (80 mg total) by mouth 2 (two) times daily. 02/07/17  Yes Abraham, Sherin, DO     No Known Allergies  ROS:  Out of  a complete 14 system review of symptoms, the patient complains only of the following symptoms, and all other reviewed systems are negative.  Jerking, seizures Blindness, left eye  Blood pressure 109/70, pulse 84, SpO2 97 %.  Physical Exam  General: The patient is alert and cooperative at the time of the examination.  Eyes: Pupil on the right is round and reactive. Discs right is flat on the.  The patient has blindness in the left eye, unable to visualize the disc.  Neck: The neck is supple, no carotid bruits are noted.  Respiratory: The respiratory examination is clear.  Cardiovascular: The cardiovascular examination reveals a regular rate and rhythm, no obvious murmurs or rubs are noted.  Skin: Extremities are without significant edema.  The patient has a right below-knee amputation.  Neurologic Exam  Mental status: The patient is alert and oriented x 3 at the time of the examination. The patient has apparent normal recent and remote memory, with an apparently normal attention span and concentration ability.  Cranial nerves: Facial symmetry is present. There is good sensation of the face to pinprick and soft touch bilaterally. The strength of  the facial muscles and the muscles to head turning and shoulder shrug are normal bilaterally. Speech is well enunciated, no aphasia or dysarthria is noted. Extraocular movements are full. Visual fields are constricted, the patient is able to count fingers. The tongue is midline, and the patient has symmetric elevation of the soft palate. No obvious hearing deficits are noted.  Motor: The motor testing reveals 5 over 5 strength of all 4 extremities. Good symmetric motor tone is noted throughout.  Sensory: Sensory testing is intact to pinprick, soft touch, vibration sensation, and position sense on the upper extremities.  Pinprick sensation is symmetric above the knees bilaterally, the patient has decreased pinprick and vibration sensation and position sensation on the left foot.  No evidence of extinction is noted.  Coordination: Cerebellar testing reveals good finger-nose-finger bilaterally.  Gait and station: Gait was not tested, the patient is nonambulatory.  Reflexes: Deep tendon reflexes are symmetric, but are decreased bilaterally. Toes are downgoing bilaterally.   MRI brain 02/01/17:  IMPRESSION: No acute intracranial finding. Chronic small-vessel ischemic changes of the pons and cerebellum. 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 No new finding.  No abnormal appearance to the right globe consistent with hemorrhage or treatment for detached retina.  * MRI scan images were reviewed online. I agree with the written report.   EEG 01/31/17:  EEG Abnormalities: 1) generalized irregular slow activity 2) slow PDR  Clinical Interpretation: This EEG is consistent with a generalized non-specific cerebral dysfunction(encephalopathy). There was no seizure or seizure predisposition recorded on this study.     Assessment/Plan:  1.  Cerebrovascular disease,  bihemispheric cortical strokes  2.  Focal seizures, left body  The patient is to remain on Keppra, he has not had any seizures since being back on this medication.  The patient will follow-up in 6 months, they are to contact me if the seizures recur.  Jake Alexanders MD 05/06/2017 11:34 AM  [facility] [address] [place], [place] [address]  Phone [phone] Fax [phone]

## 2017-05-06 NOTE — Patient Instructions (Signed)
   Continue Keppra for now, let us know if there is a recurrence of seizures.

## 2017-06-24 ENCOUNTER — Non-Acute Institutional Stay: Payer: Medicaid Other | Admitting: Hospice and Palliative Medicine

## 2017-06-24 DIAGNOSIS — Z515 Encounter for palliative care: Secondary | ICD-10-CM

## 2017-06-24 NOTE — Progress Notes (Signed)
FOLLOW UP PALLIATIVE CARE CONSULT VISIT   PATIENT NAME: [name] DOB: 07/01/1955 MRN: [id number]  REFERRING PROVIDER: Kerney Elbe, DO [place], [place] [address]   RESPONSIBLE PARTY:  Acct ID - Guarantor Home Phone Work Phone Relationship Acct Type  [id number] TORSTEN, [phone]  Self P/F     [address], [place], [place] [address]    RECOMMENDATIONS and PLAN:  1.  Weakness - at baseline. He is wheelchair bound and dependent upon staff for care.  2.  Constipation - on senna daily. Monitor. 3.  ACP - DNR signed on chart  I spent 10 minutes providing this consultation,  from 1000 to 1010. More than 50% of the time in this consultation was spent coordinating communication.   HISTORY OF PRESENT ILLNESS:  Mr. Mccaughey is a 62 year old male with severe HF and DM, poor eye sight and cognitive disability. I have been following patient in the home and now SNF. He has a history of recurrent hospitalizations, often due to noncompliance with medications. However, hospitalizations have decreased since he was placed at [facility]. Patient was last seen in the ED in 04/2017, for seizures. He has no recent seizure activity. Staff report that patient has been relatively stable over the past month. He is eating well and has had no significant decline or weight loss. CHF seems well managed. Routine follow up visit made today.  CODE STATUS: DNR - on chart   PPS: 30% HOSPICE ELIGIBILITY/DIAGNOSIS: Maybe.  TBD  PHYSICAL EXAM:   GEN: frail appearing, full exam deferred by request, sitting in wheelchair, comfortable appearing LUNGS: nonlabored SKIN: intact NEURO: confused, verbal, follows commands    Irean Hong, NP

## 2017-10-23 ENCOUNTER — Encounter: Payer: Self-pay | Admitting: Adult Health

## 2017-11-06 ENCOUNTER — Ambulatory Visit: Payer: Medicaid Other | Admitting: Adult Health

## 2018-02-06 IMAGING — US US SCROTUM
1 series · 14 of 25 positions shown · non-contrast
Comparison: 07/08/2015

CLINICAL DATA: Swelling and pain

EXAM:
ULTRASOUND OF SCROTUM
TECHNIQUE: Complete ultrasound examination of the testicles, epididymis, and
other scrotal structures was performed.

[Series 1: us scrotum · 0.10mm/px · 14 of 38 slices shown]
[im 1/38]
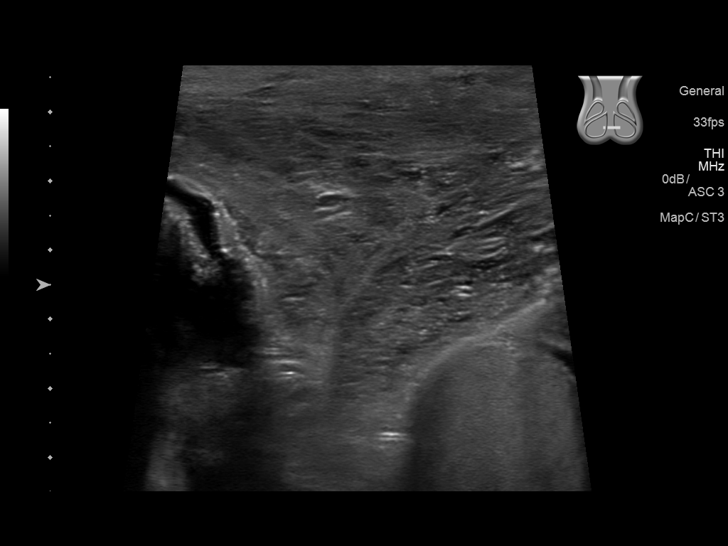
[im 4/38]
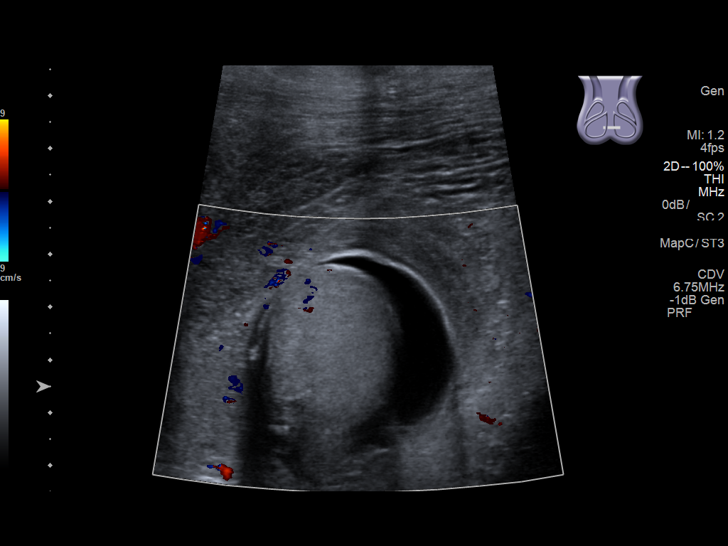
[im 7/38]
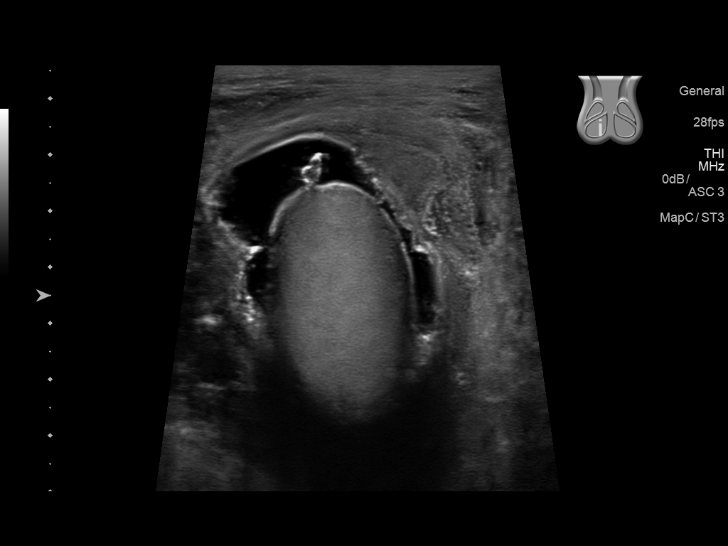
[im 10/38]
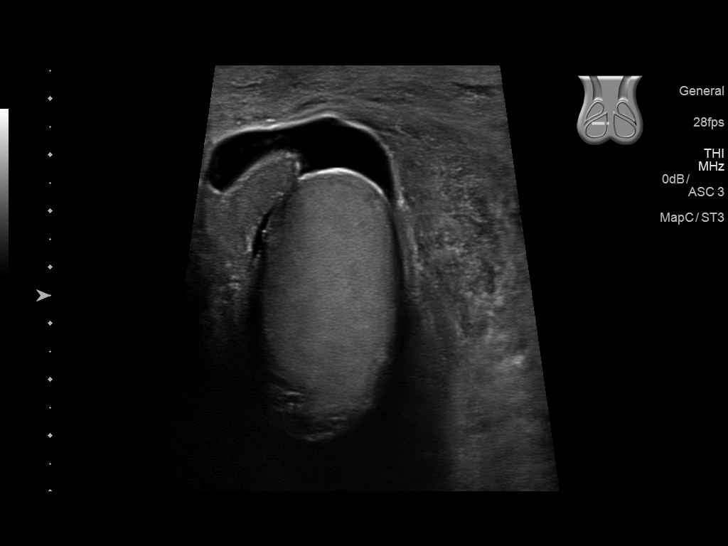
[im 13/38]
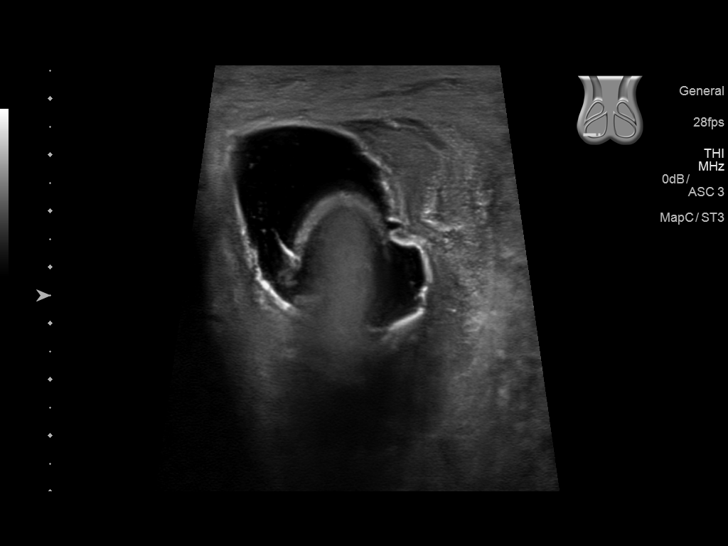
[im 14/38]
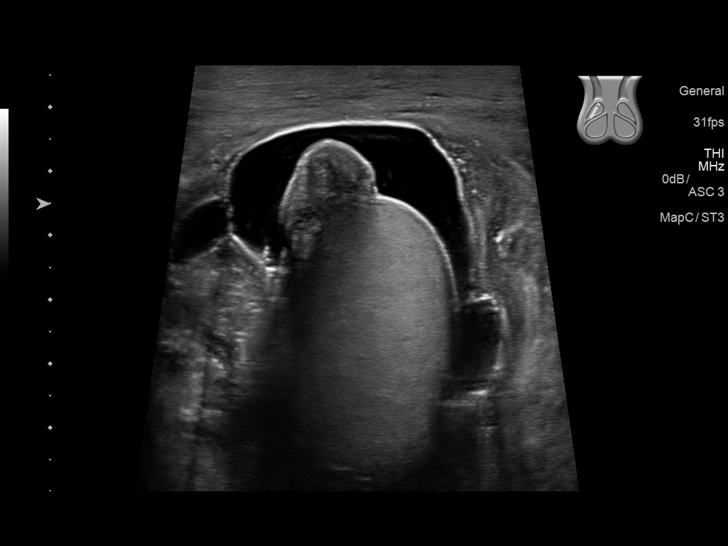
[im 17/38]
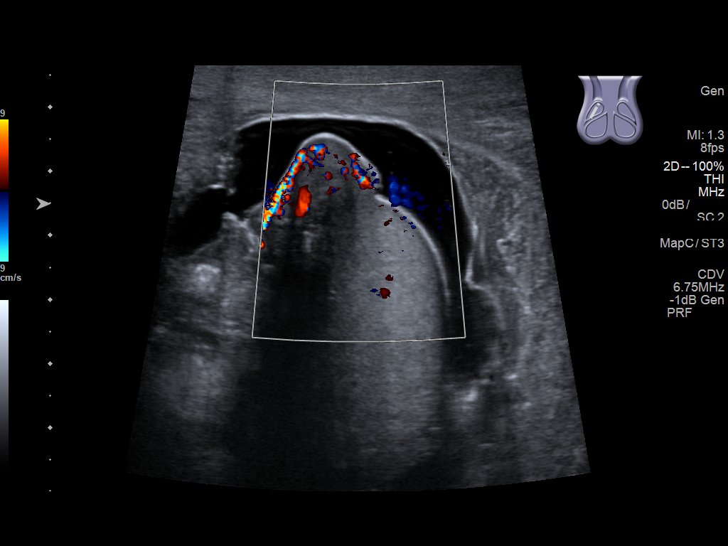
[im 21/38]
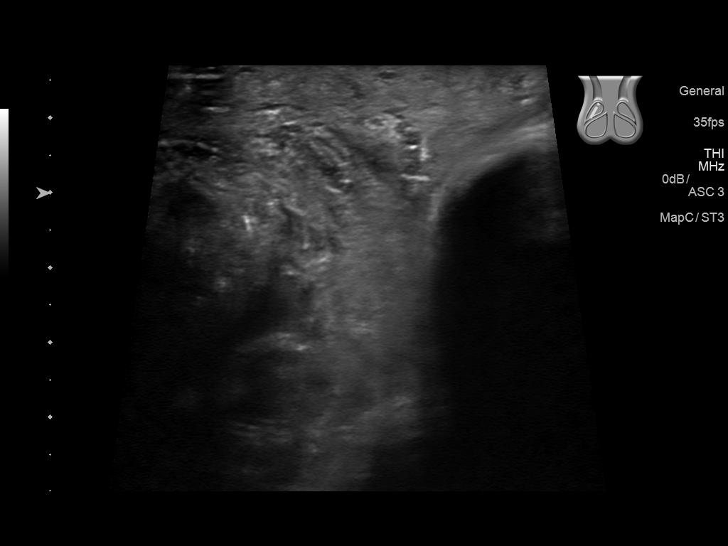
[im 24/38]
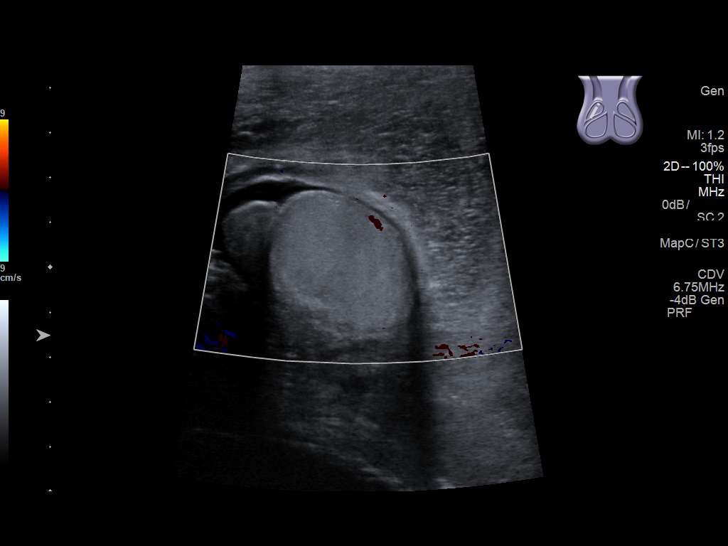
[im 25/38]
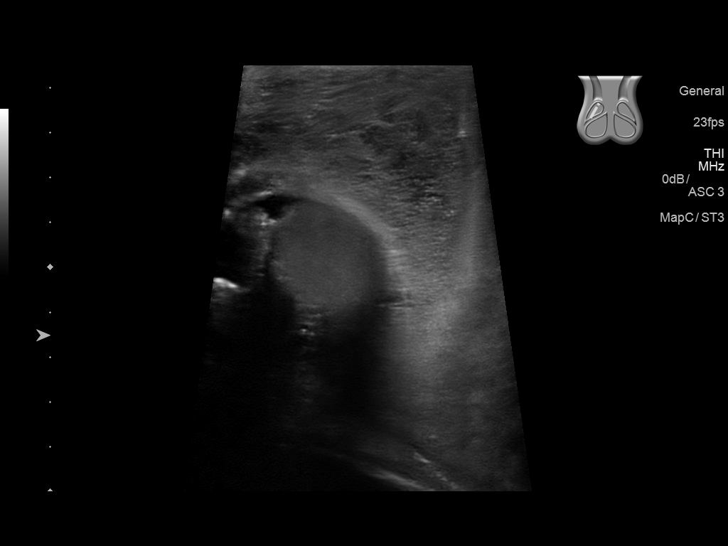
[im 28/38]
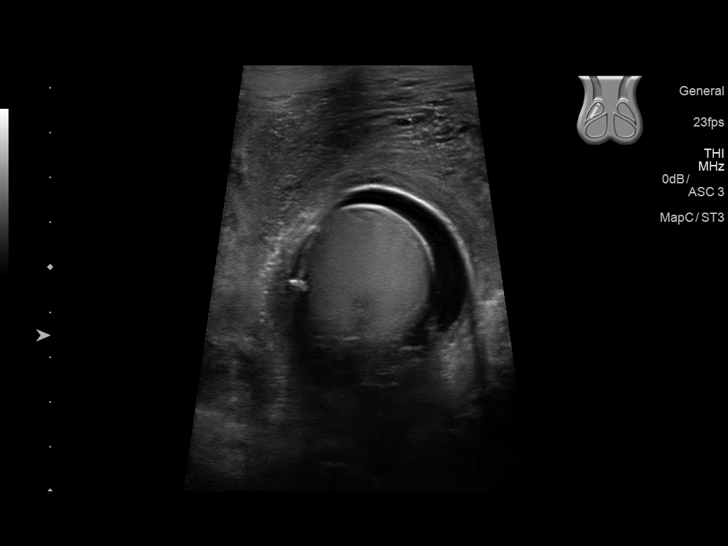
[im 31/38]
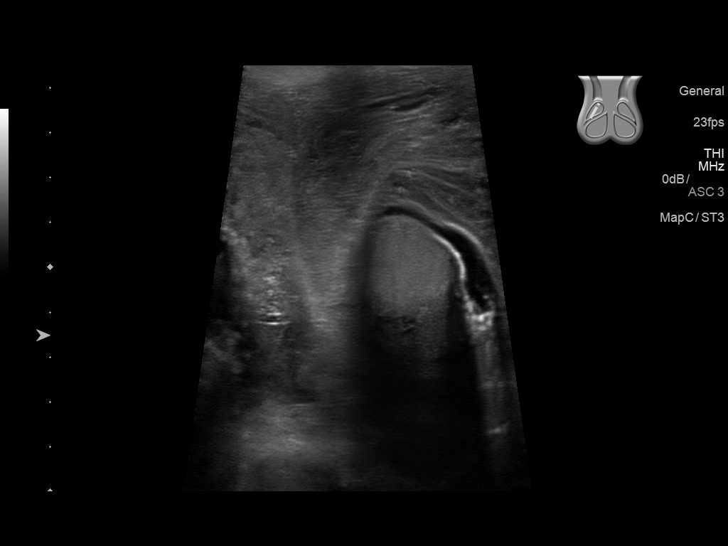
[im 34/38]
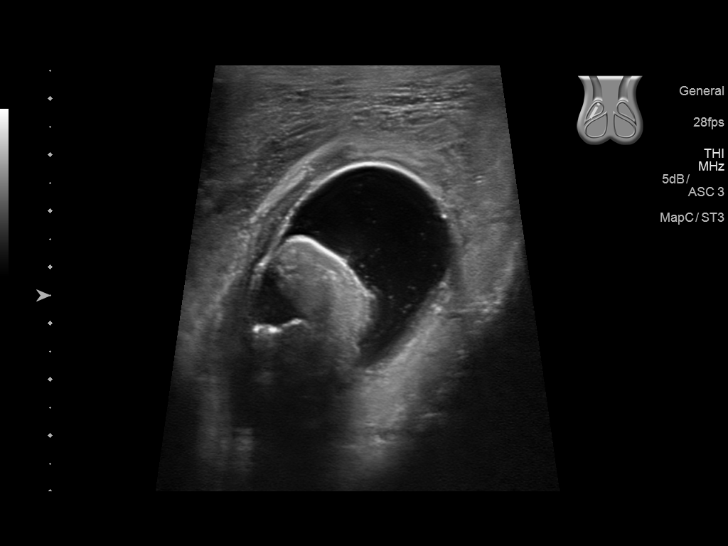
[im 38/38]
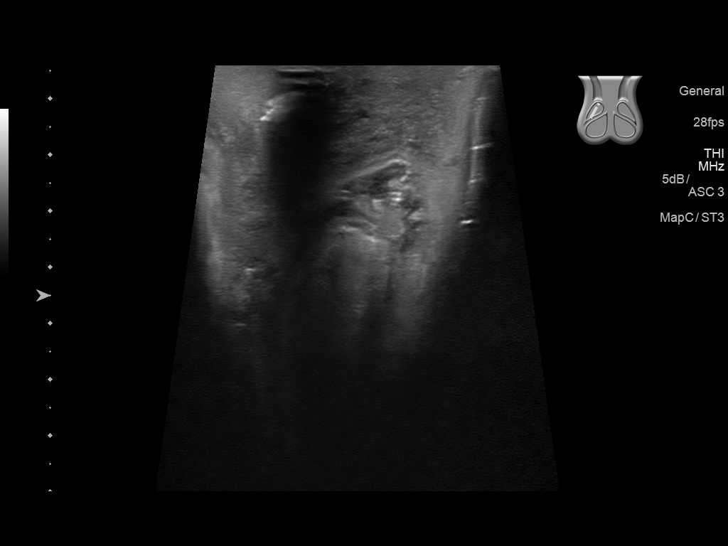

[14 of 25 positions shown; findings below may reference images not displayed]

FINDINGS: Right testicle

Measurements: 4.3 x 2.7 x 2.4 cm. No mass or microlithiasis
visualized.

Left testicle

Measurements: 3.2 x 3.5 x 2.9 cm. No mass or microlithiasis
visualized.

Right epididymis:  Normal in size and appearance.

Left epididymis:  Normal in size and appearance.

Hydrocele:  Moderate bilateral hydroceles.

Varicocele: None visualized. Marked scrotal edema and skin
thickening, skin thickness on the right measures 1.7 cm and on the
left 4.3 cm. No focal fluid collections within the edematous soft
tissues of the scrotal sac.
IMPRESSION: 1. Normal appearance of the testes
2. Large amount of scrotal edema, left greater than right with skin
thickening.
3. Moderate bilateral hydroceles

## 2019-04-09 IMAGING — DX DG CHEST 1V PORT
1 series · 1 of 1 positions shown · non-contrast
Comparison: Chest x-ray of September 24, 2016

CLINICAL DATA: Follow-up CHF.

EXAM:
PORTABLE CHEST 1 VIEW

[chest ap]
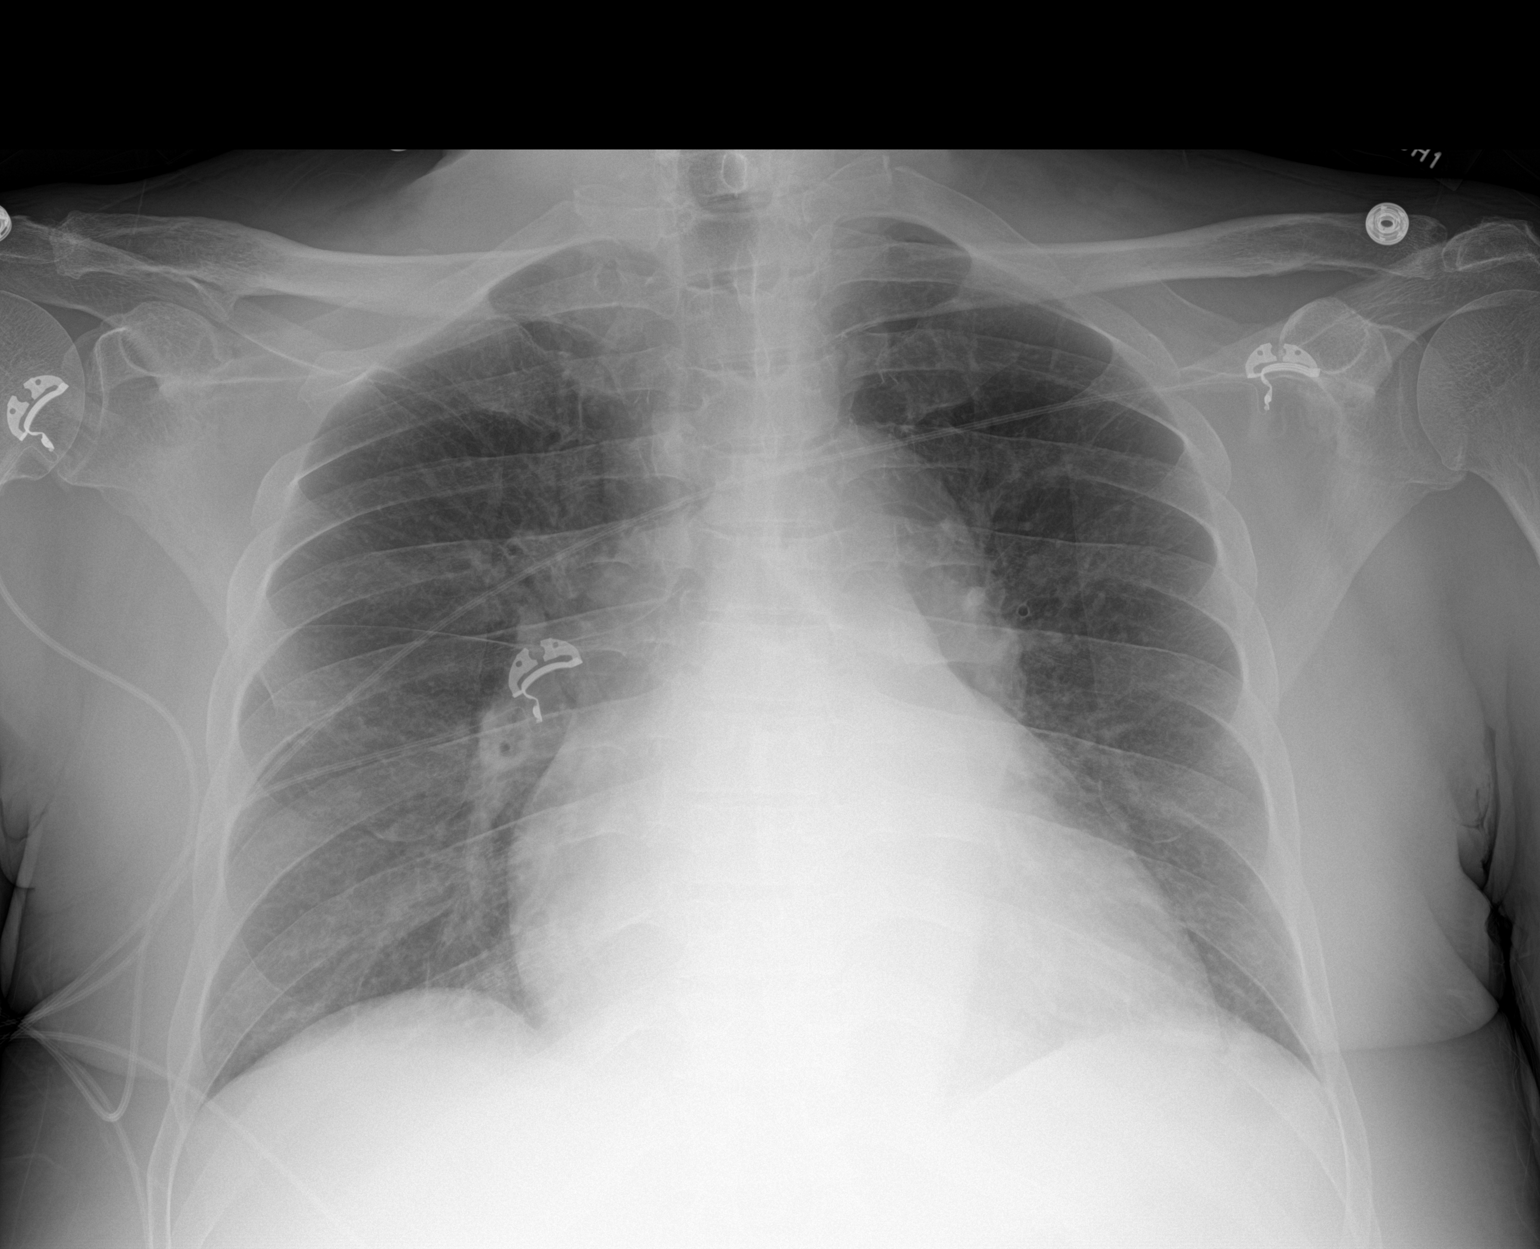

[1 of 1 positions shown; findings below may reference images not displayed]

FINDINGS: The lungs are adequately inflated. There is no focal infiltrate.
There is no pleural effusion. The cardiac silhouette remains mildly
enlarged. The pulmonary vascularity is not engorged. There is mild
tortuosity of the ascending and descending thoracic aorta. The
trachea is midline. The bony thorax exhibits no acute abnormality.
IMPRESSION: Stable cardiomegaly.  No acute cardiopulmonary abnormality.

## 2019-04-29 IMAGING — CR DG CHEST 2V
2 series · 2 of 2 positions shown · non-contrast
Comparison: Chest radiograph from one day prior.

CLINICAL DATA: CHF

EXAM:
CHEST  2 VIEW

[chest lat]
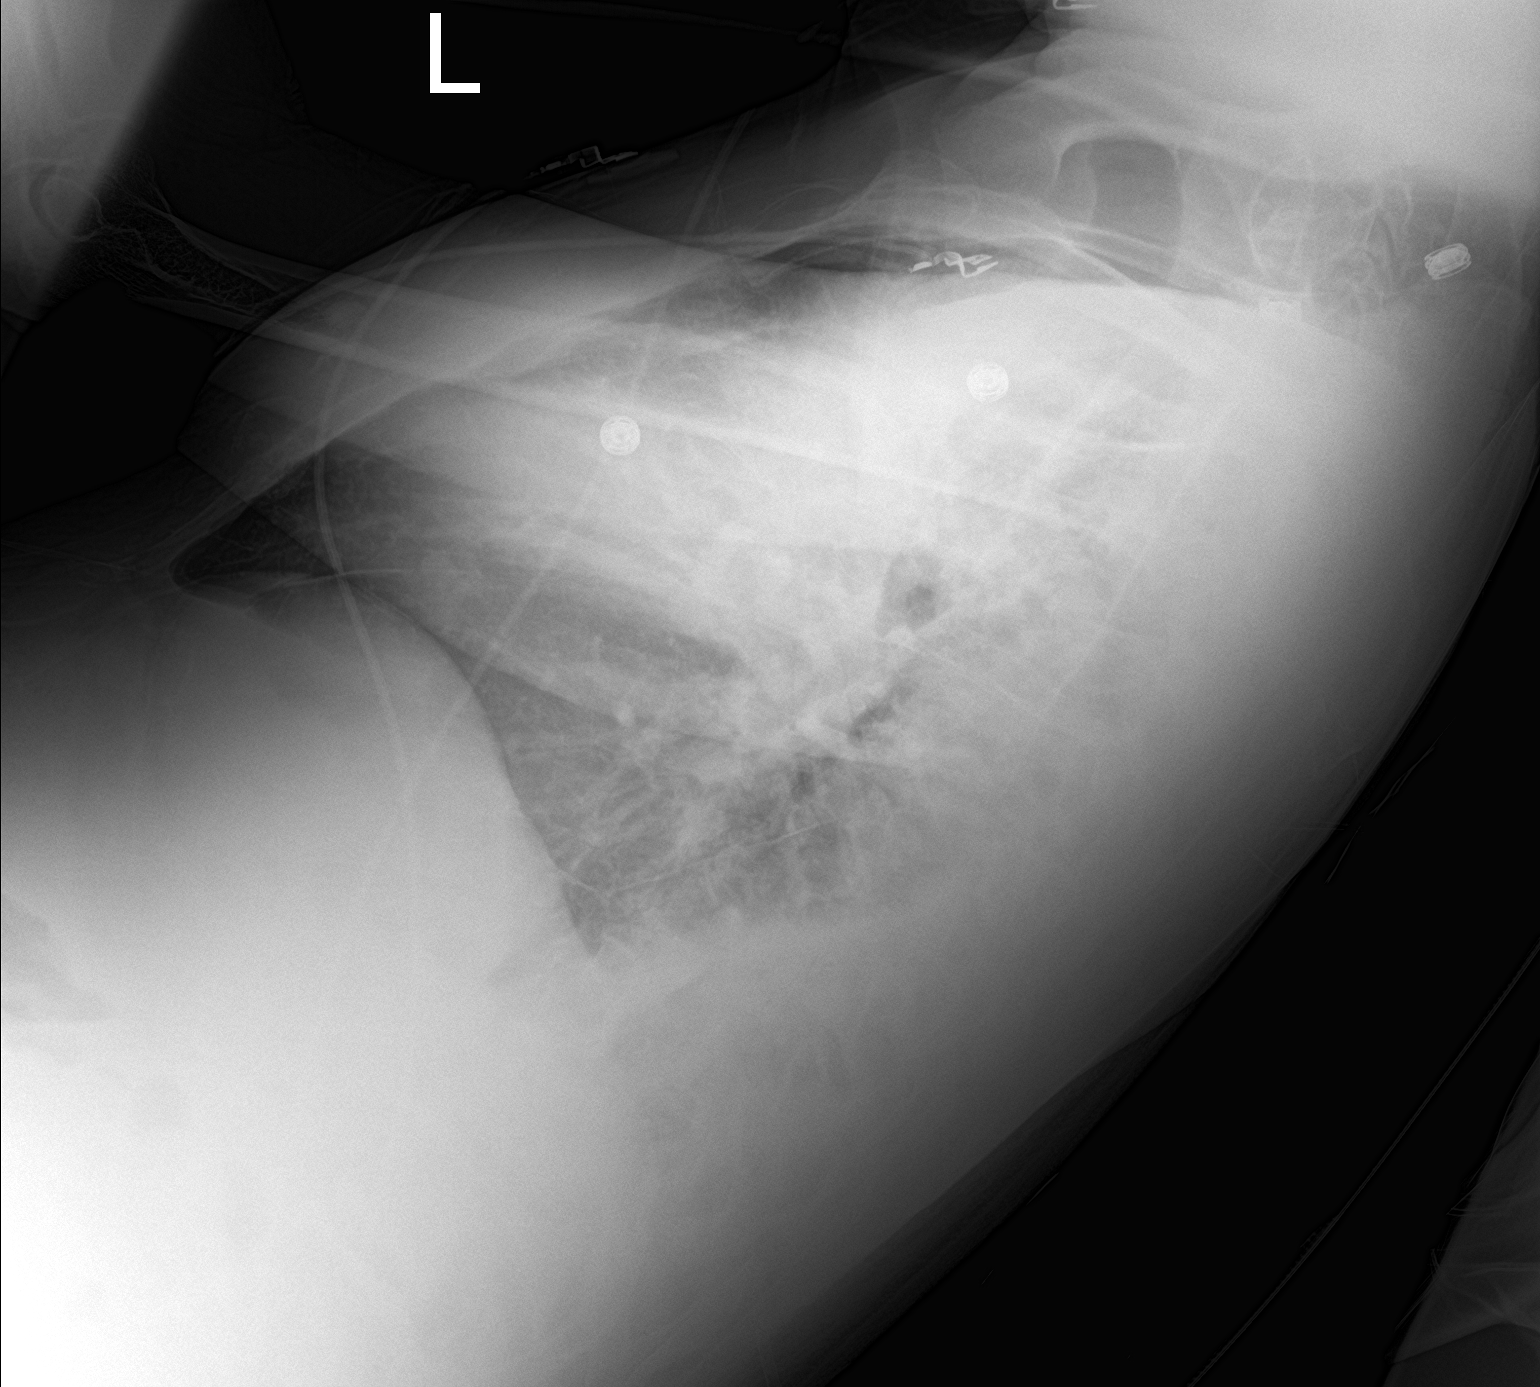

[chest ap]
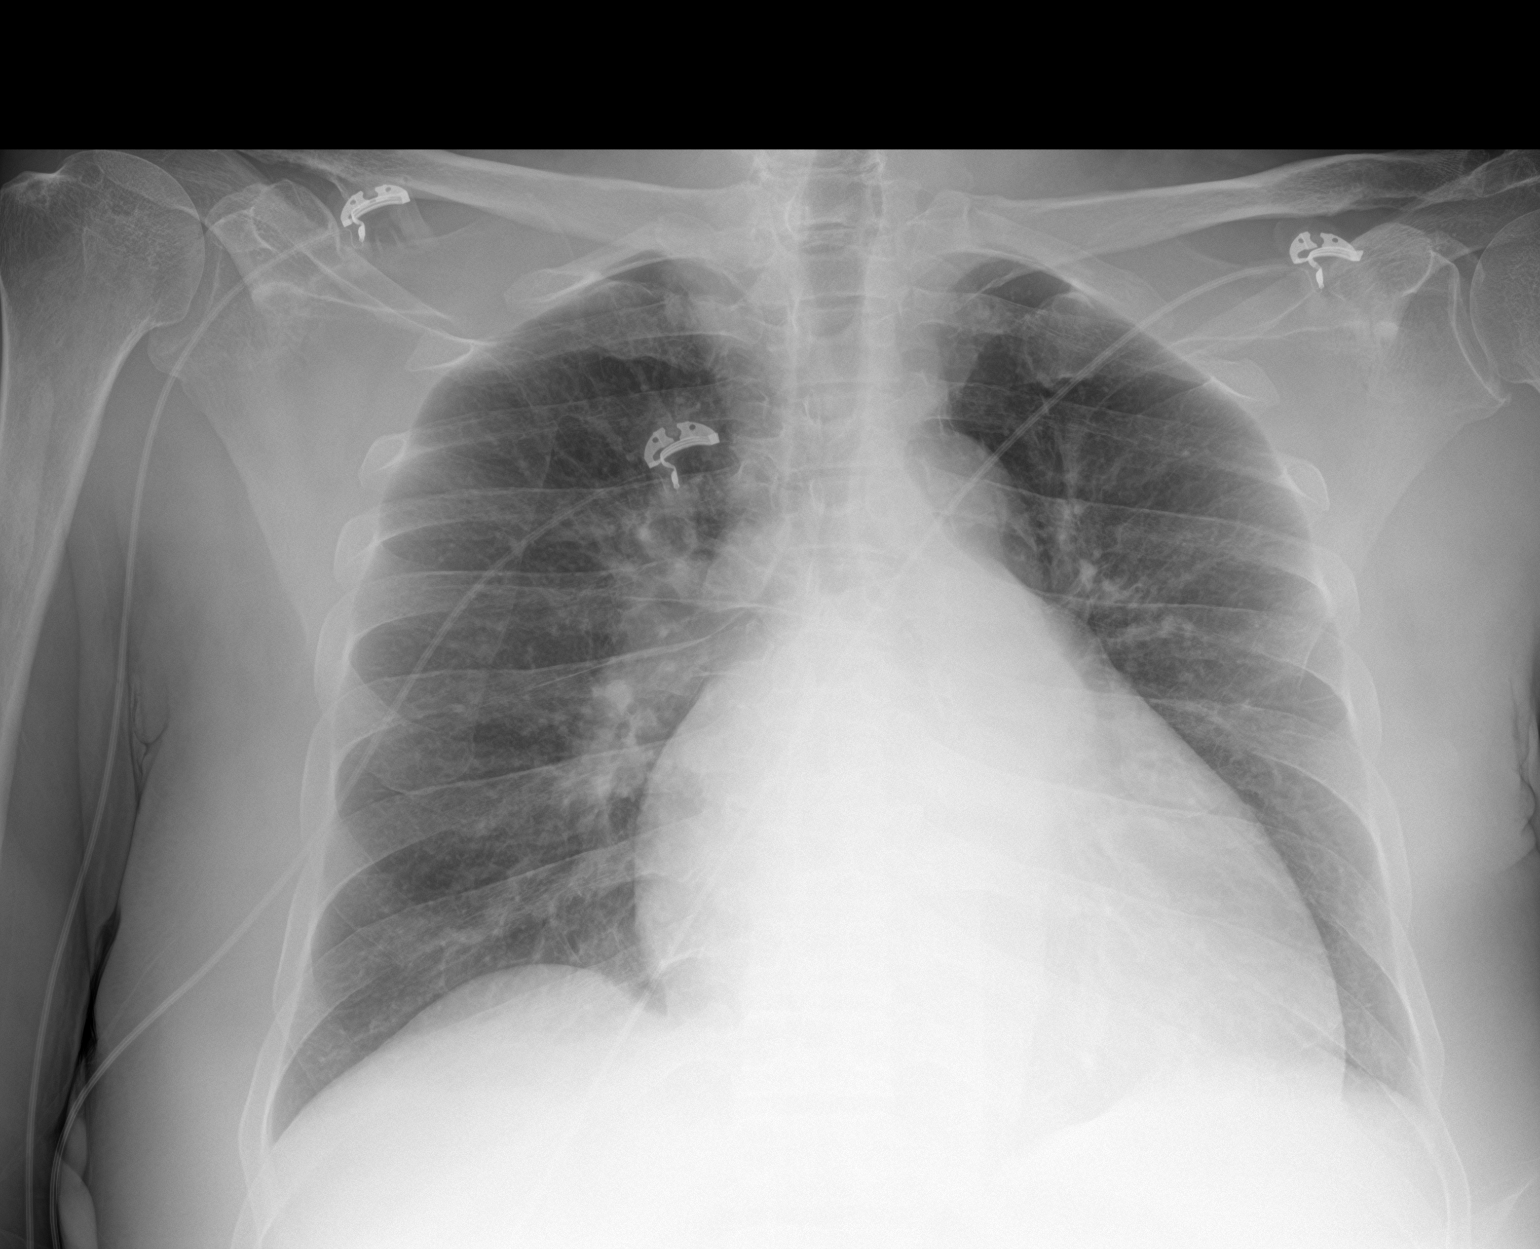

[2 of 2 positions shown; findings below may reference images not displayed]

FINDINGS: Stable cardiomediastinal silhouette with mild enlargement of the
cardiopericardial silhouette. No pneumothorax. Trace bilateral
pleural effusions. Cephalization of the pulmonary vasculature
without overt pulmonary edema.
IMPRESSION: Stable enlargement of the cardiopericardial silhouette without overt
pulmonary edema.

Trace bilateral pleural effusions.

## 2020-06-22 ENCOUNTER — Emergency Department (HOSPITAL_COMMUNITY): Payer: Medicaid Other

## 2020-06-22 ENCOUNTER — Inpatient Hospital Stay (HOSPITAL_COMMUNITY)
Admission: EM | Admit: 2020-06-22 | Discharge: 2020-06-27 | DRG: 853 | Disposition: A | Discharge status: Skilled Nursing Facility | Payer: Medicaid Other | Attending: Internal Medicine | Admitting: Internal Medicine

## 2020-06-22 DIAGNOSIS — E860 Dehydration: Secondary | ICD-10-CM | POA: Diagnosis present

## 2020-06-22 DIAGNOSIS — M726 Necrotizing fasciitis: Secondary | ICD-10-CM | POA: Diagnosis present

## 2020-06-22 DIAGNOSIS — Z7982 Long term (current) use of aspirin: Secondary | ICD-10-CM

## 2020-06-22 DIAGNOSIS — I420 Dilated cardiomyopathy: Secondary | ICD-10-CM | POA: Diagnosis not present

## 2020-06-22 DIAGNOSIS — Z79899 Other long term (current) drug therapy: Secondary | ICD-10-CM

## 2020-06-22 DIAGNOSIS — E785 Hyperlipidemia, unspecified: Secondary | ICD-10-CM | POA: Diagnosis present

## 2020-06-22 DIAGNOSIS — Z66 Do not resuscitate: Secondary | ICD-10-CM | POA: Diagnosis present

## 2020-06-22 DIAGNOSIS — L97509 Non-pressure chronic ulcer of other part of unspecified foot with unspecified severity: Secondary | ICD-10-CM

## 2020-06-22 DIAGNOSIS — I071 Rheumatic tricuspid insufficiency: Secondary | ICD-10-CM | POA: Diagnosis present

## 2020-06-22 DIAGNOSIS — L02612 Cutaneous abscess of left foot: Secondary | ICD-10-CM | POA: Diagnosis not present

## 2020-06-22 DIAGNOSIS — I2721 Secondary pulmonary arterial hypertension: Secondary | ICD-10-CM | POA: Diagnosis present

## 2020-06-22 DIAGNOSIS — E11621 Type 2 diabetes mellitus with foot ulcer: Secondary | ICD-10-CM | POA: Diagnosis present

## 2020-06-22 DIAGNOSIS — Z7984 Long term (current) use of oral hypoglycemic drugs: Secondary | ICD-10-CM | POA: Diagnosis not present

## 2020-06-22 DIAGNOSIS — I428 Other cardiomyopathies: Secondary | ICD-10-CM | POA: Diagnosis not present

## 2020-06-22 DIAGNOSIS — Z794 Long term (current) use of insulin: Secondary | ICD-10-CM | POA: Diagnosis not present

## 2020-06-22 DIAGNOSIS — L089 Local infection of the skin and subcutaneous tissue, unspecified: Secondary | ICD-10-CM | POA: Diagnosis present

## 2020-06-22 DIAGNOSIS — E1152 Type 2 diabetes mellitus with diabetic peripheral angiopathy with gangrene: Secondary | ICD-10-CM | POA: Diagnosis not present

## 2020-06-22 DIAGNOSIS — L97426 Non-pressure chronic ulcer of left heel and midfoot with bone involvement without evidence of necrosis: Secondary | ICD-10-CM | POA: Diagnosis present

## 2020-06-22 DIAGNOSIS — I739 Peripheral vascular disease, unspecified: Secondary | ICD-10-CM | POA: Diagnosis not present

## 2020-06-22 DIAGNOSIS — Z89511 Acquired absence of right leg below knee: Secondary | ICD-10-CM

## 2020-06-22 DIAGNOSIS — E1140 Type 2 diabetes mellitus with diabetic neuropathy, unspecified: Secondary | ICD-10-CM | POA: Diagnosis present

## 2020-06-22 DIAGNOSIS — R6520 Severe sepsis without septic shock: Secondary | ICD-10-CM | POA: Diagnosis present

## 2020-06-22 DIAGNOSIS — E861 Hypovolemia: Secondary | ICD-10-CM | POA: Diagnosis present

## 2020-06-22 DIAGNOSIS — E1122 Type 2 diabetes mellitus with diabetic chronic kidney disease: Secondary | ICD-10-CM | POA: Diagnosis present

## 2020-06-22 DIAGNOSIS — I5023 Acute on chronic systolic (congestive) heart failure: Secondary | ICD-10-CM | POA: Diagnosis not present

## 2020-06-22 DIAGNOSIS — I70262 Atherosclerosis of native arteries of extremities with gangrene, left leg: Secondary | ICD-10-CM | POA: Diagnosis present

## 2020-06-22 DIAGNOSIS — N179 Acute kidney failure, unspecified: Secondary | ICD-10-CM | POA: Diagnosis present

## 2020-06-22 DIAGNOSIS — A419 Sepsis, unspecified organism: Principal | ICD-10-CM | POA: Diagnosis present

## 2020-06-22 DIAGNOSIS — E1142 Type 2 diabetes mellitus with diabetic polyneuropathy: Secondary | ICD-10-CM | POA: Diagnosis not present

## 2020-06-22 DIAGNOSIS — Z87891 Personal history of nicotine dependence: Secondary | ICD-10-CM

## 2020-06-22 DIAGNOSIS — H543 Unqualified visual loss, both eyes: Secondary | ICD-10-CM | POA: Diagnosis present

## 2020-06-22 DIAGNOSIS — I5042 Chronic combined systolic (congestive) and diastolic (congestive) heart failure: Secondary | ICD-10-CM | POA: Diagnosis not present

## 2020-06-22 DIAGNOSIS — G40109 Localization-related (focal) (partial) symptomatic epilepsy and epileptic syndromes with simple partial seizures, not intractable, without status epilepticus: Secondary | ICD-10-CM | POA: Diagnosis present

## 2020-06-22 DIAGNOSIS — Z20822 Contact with and (suspected) exposure to covid-19: Secondary | ICD-10-CM | POA: Diagnosis present

## 2020-06-22 DIAGNOSIS — Z8249 Family history of ischemic heart disease and other diseases of the circulatory system: Secondary | ICD-10-CM

## 2020-06-22 DIAGNOSIS — Z751 Person awaiting admission to adequate facility elsewhere: Secondary | ICD-10-CM

## 2020-06-22 DIAGNOSIS — M86172 Other acute osteomyelitis, left ankle and foot: Secondary | ICD-10-CM | POA: Diagnosis present

## 2020-06-22 DIAGNOSIS — E440 Moderate protein-calorie malnutrition: Secondary | ICD-10-CM | POA: Insufficient documentation

## 2020-06-22 DIAGNOSIS — I110 Hypertensive heart disease with heart failure: Secondary | ICD-10-CM | POA: Diagnosis present

## 2020-06-22 DIAGNOSIS — E1165 Type 2 diabetes mellitus with hyperglycemia: Secondary | ICD-10-CM | POA: Diagnosis not present

## 2020-06-22 DIAGNOSIS — I5022 Chronic systolic (congestive) heart failure: Secondary | ICD-10-CM | POA: Diagnosis not present

## 2020-06-22 DIAGNOSIS — L0889 Other specified local infections of the skin and subcutaneous tissue: Secondary | ICD-10-CM | POA: Diagnosis not present

## 2020-06-22 LAB — CBC WITH DIFFERENTIAL/PLATELET
Abs Immature Granulocytes: 0 10*3/uL (ref 0.00–0.07)
Basophils Absolute: 0 10*3/uL (ref 0.0–0.1)
Basophils Relative: 0 %
Eosinophils Absolute: 0 10*3/uL (ref 0.0–0.5)
Eosinophils Relative: 0 %
HCT: 35.7 % — ABNORMAL LOW (ref 39.0–52.0)
Hemoglobin: 11.5 g/dL — ABNORMAL LOW (ref 13.0–17.0)
Lymphocytes Relative: 4 %
Lymphs Abs: 1.3 10*3/uL (ref 0.7–4.0)
MCH: 27.5 pg (ref 26.0–34.0)
MCHC: 32.2 g/dL (ref 30.0–36.0)
MCV: 85.4 fL (ref 80.0–100.0)
Monocytes Absolute: 0.3 10*3/uL (ref 0.1–1.0)
Monocytes Relative: 1 %
Neutro Abs: 29.8 10*3/uL — ABNORMAL HIGH (ref 1.7–7.7)
Neutrophils Relative %: 95 %
Platelets: 503 10*3/uL — ABNORMAL HIGH (ref 150–400)
RBC: 4.18 MIL/uL — ABNORMAL LOW (ref 4.22–5.81)
RDW: 13.5 % (ref 11.5–15.5)
WBC: 31.4 10*3/uL — ABNORMAL HIGH (ref 4.0–10.5)
nRBC: 0 % (ref 0.0–0.2)
nRBC: 0 /100 WBC

## 2020-06-22 LAB — COMPREHENSIVE METABOLIC PANEL
ALT: 22 U/L (ref 0–44)
AST: 25 U/L (ref 15–41)
Albumin: 2.1 g/dL — ABNORMAL LOW (ref 3.5–5.0)
Alkaline Phosphatase: 95 U/L (ref 38–126)
Anion gap: 11 (ref 5–15)
BUN: 79 mg/dL — ABNORMAL HIGH (ref 8–23)
CO2: 27 mmol/L (ref 22–32)
Calcium: 8.9 mg/dL (ref 8.9–10.3)
Chloride: 95 mmol/L — ABNORMAL LOW (ref 98–111)
Creatinine, Ser: 2.03 mg/dL — ABNORMAL HIGH (ref 0.61–1.24)
GFR, Estimated: 36 mL/min — ABNORMAL LOW (ref >60)
Glucose, Bld: 357 mg/dL — ABNORMAL HIGH (ref 70–99)
Potassium: 3.7 mmol/L (ref 3.5–5.1)
Sodium: 133 mmol/L — ABNORMAL LOW (ref 135–145)
Total Bilirubin: 1.2 mg/dL (ref 0.3–1.2)
Total Protein: 7.3 g/dL (ref 6.5–8.1)

## 2020-06-22 LAB — RESP PANEL BY RT-PCR (FLU A&B, COVID) ARPGX2
Influenza A by PCR: NEGATIVE
Influenza B by PCR: NEGATIVE
SARS Coronavirus 2 by RT PCR: NEGATIVE

## 2020-06-22 LAB — HEMOGLOBIN A1C
Hgb A1c MFr Bld: 8 % — ABNORMAL HIGH (ref 4.8–5.6)
Mean Plasma Glucose: 182.9 mg/dL

## 2020-06-22 LAB — SEDIMENTATION RATE: Sed Rate: 104 mm/hr — ABNORMAL HIGH (ref 0–16)

## 2020-06-22 LAB — CBG MONITORING, ED: Glucose-Capillary: 350 mg/dL — ABNORMAL HIGH (ref 70–99)

## 2020-06-22 MED ORDER — ACETAMINOPHEN 650 MG RE SUPP: 650.0000 mg | Freq: Four times a day (QID) | RECTAL | Status: DC | PRN

## 2020-06-22 MED ORDER — ACETAMINOPHEN 325 MG PO TABS
650.0000 mg | ORAL_TABLET | Freq: Four times a day (QID) | ORAL | Status: DC | PRN
  Administered 2020-06-24: 650 mg via ORAL
  Filled 2020-06-22: qty 2

## 2020-06-22 MED ORDER — ADULT MULTIVITAMIN W/MINERALS CH
1.0000 | ORAL_TABLET | Freq: Every day | ORAL | Status: DC
  Administered 2020-06-23 – 2020-06-27 (×4): 1 via ORAL
  Filled 2020-06-22 (×5): qty 1

## 2020-06-22 MED ORDER — HEPARIN SODIUM (PORCINE) 5000 UNIT/ML IJ SOLN
5000.0000 [IU] | Freq: Three times a day (TID) | INTRAMUSCULAR | Status: DC
  Administered 2020-06-22 – 2020-06-23 (×4): 5000 [IU] via SUBCUTANEOUS
  Filled 2020-06-22 (×4): qty 1

## 2020-06-22 MED ORDER — LACTATED RINGERS IV SOLN: INTRAVENOUS | Status: DC

## 2020-06-22 MED ORDER — INSULIN ASPART 100 UNIT/ML ~~LOC~~ SOLN
0.0000 [IU] | Freq: Three times a day (TID) | SUBCUTANEOUS | Status: DC
  Administered 2020-06-23: 2 [IU] via SUBCUTANEOUS
  Administered 2020-06-23: 8 [IU] via SUBCUTANEOUS
  Administered 2020-06-23: 5 [IU] via SUBCUTANEOUS
  Administered 2020-06-24: 3 [IU] via SUBCUTANEOUS
  Administered 2020-06-24: 8 [IU] via SUBCUTANEOUS
  Administered 2020-06-24: 5 [IU] via SUBCUTANEOUS
  Administered 2020-06-25: 3 [IU] via SUBCUTANEOUS
  Administered 2020-06-25: 5 [IU] via SUBCUTANEOUS
  Administered 2020-06-25: 8 [IU] via SUBCUTANEOUS
  Administered 2020-06-26 (×3): 5 [IU] via SUBCUTANEOUS
  Administered 2020-06-27: 11 [IU] via SUBCUTANEOUS
  Administered 2020-06-27: 8 [IU] via SUBCUTANEOUS

## 2020-06-22 MED ORDER — CLINDAMYCIN PHOSPHATE 600 MG/50ML IV SOLN
600.0000 mg | Freq: Three times a day (TID) | INTRAVENOUS | Status: DC
  Administered 2020-06-23 – 2020-06-24 (×4): 600 mg via INTRAVENOUS
  Filled 2020-06-22 (×5): qty 50

## 2020-06-22 MED ORDER — DORZOLAMIDE HCL-TIMOLOL MAL 2-0.5 % OP SOLN
1.0000 [drp] | Freq: Two times a day (BID) | OPHTHALMIC | Status: DC
  Administered 2020-06-23 – 2020-06-25 (×5): 1 [drp] via OPHTHALMIC
  Filled 2020-06-22 (×2): qty 10

## 2020-06-22 MED ORDER — PIPERACILLIN-TAZOBACTAM 3.375 G IVPB 30 MIN
3.3750 g | Freq: Once | INTRAVENOUS | Status: AC
  Administered 2020-06-22: 3.375 g via INTRAVENOUS
  Filled 2020-06-22: qty 50

## 2020-06-22 MED ORDER — POLYETHYLENE GLYCOL 3350 17 G PO PACK: 17.0000 g | PACK | Freq: Every day | ORAL | Status: DC | PRN

## 2020-06-22 MED ORDER — BRIMONIDINE TARTRATE 0.2 % OP SOLN
1.0000 [drp] | Freq: Two times a day (BID) | OPHTHALMIC | Status: DC
  Administered 2020-06-22 – 2020-06-26 (×8): 1 [drp] via OPHTHALMIC
  Filled 2020-06-22 (×4): qty 5

## 2020-06-22 MED ORDER — PIPERACILLIN-TAZOBACTAM 3.375 G IVPB
3.3750 g | Freq: Three times a day (TID) | INTRAVENOUS | Status: DC
  Administered 2020-06-22 – 2020-06-24 (×5): 3.375 g via INTRAVENOUS
  Filled 2020-06-22 (×5): qty 50

## 2020-06-22 MED ORDER — VANCOMYCIN HCL 1000 MG/200ML IV SOLN
1000.0000 mg | INTRAVENOUS | Status: DC
  Administered 2020-06-23: 1000 mg via INTRAVENOUS
  Filled 2020-06-22: qty 200

## 2020-06-22 MED ORDER — CLINDAMYCIN PHOSPHATE 600 MG/50ML IV SOLN
600.0000 mg | Freq: Once | INTRAVENOUS | Status: DC
  Filled 2020-06-22: qty 50

## 2020-06-22 MED ORDER — VANCOMYCIN HCL 2000 MG/400ML IV SOLN
2000.0000 mg | Freq: Once | INTRAVENOUS | Status: AC
  Administered 2020-06-22: 2000 mg via INTRAVENOUS
  Filled 2020-06-22: qty 400

## 2020-06-22 NOTE — Consult Note (Signed)
Reason for Consult:Left foot infection Referring Physician: D Ray Time called: [phone]  Time at bedside: [place] is an 65 y.o. male.  HPI: [name] comes in with a 4-5 hx/o of worsening infection of his left foot. He's been dealing with an ulceration there for a while but it has rapidly worsened over the last week. He is effectively insensate so the foot doesn't bother him but he has noticed a bad odor. He denies fevers, chills, sweats, N/V.  Past Medical History:  Diagnosis Date  . Acute on chronic systolic heart failure, NYHA class 3 ([place])   . AKI (acute kidney injury) ([place])   . Anemia   . BKA stump complication ([place])   . Blind left eye   . Blind left eye   . Cardiomyopathy, dilated ([place])   . CHF (congestive heart failure) ([place])   . Combined systolic and diastolic congestive heart failure ([place])   . Congestive dilated cardiomyopathy ([place]) 07/11/2015  . Diabetes mellitus   . Diabetic neuropathy associated with type 2 diabetes mellitus ([place])   . 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 Hypertension   . Neuropathy   . Noncompliance with medications   . Open knee wound 10/2015   on rt bka  . PAD (peripheral artery disease) ([place])   . Protein calorie malnutrition ([place])   . Shortness of breath dyspnea     Past Surgical History:  Procedure Laterality Date  . ABDOMINAL AORTOGRAM W/LOWER EXTREMITY N/A 03/25/2017   Procedure: ABDOMINAL AORTOGRAM W/LOWER EXTREMITY;  Surgeon: Angelia Mould, MD;  Location: [facility];  Service: Cardiovascular;  Laterality: N/A;  . AMPUTATION Right 09/26/2013   Procedure: AMPUTATION BELOW KNEE;  Surgeon: Rozanna Box, MD;  Location: [facility];  Service: Orthopedics;  Laterality: Right;  . AMPUTATION Right 05/01/2015   Procedure: REVISION OF RIGHT TRANSTIBIAL AMPUTATION ;  Surgeon: Newt Minion, MD;  Location: [facility];  Service: Orthopedics;  Laterality: Right;  . APPLICATION OF WOUND VAC  08/10/2016    Procedure: APPLICATION OFI NCISONAL  WOUND VAC;  Surgeon: Newt Minion, MD;  Location: [facility];  Service: Orthopedics;;  . CARDIAC CATHETERIZATION N/A 07/13/2015   Procedure: Right/Left Heart Cath and Coronary Angiography;  Surgeon: Jolaine Artist, MD;  Location: [facility];  Service: Cardiovascular;  Laterality: N/A;  . COLONOSCOPY    . I & D EXTREMITY Right 09/24/2013   Procedure: IRRIGATION AND DEBRIDEMENT RIGHT FOOT ULCER;  Surgeon: Renette Butters, MD;  Location: [facility];  Service: Orthopedics;  Laterality: Right;  . I & D EXTREMITY Right 09/26/2013   Procedure: Repeat IRRIGATION AND DEBRIDEMENT Right Foot Ulcer;  Surgeon: Rozanna Box, MD;  Location: [facility];  Service: Orthopedics;  Laterality: Right;  . MULTIPLE TOOTH EXTRACTIONS    . SKIN SPLIT GRAFT Right 08/10/2016   Procedure: SKIN GRAFT SPLIT THICKNESS RIGHT LEG;  Surgeon: Newt Minion, MD;  Location: [facility];  Service: Orthopedics;  Laterality: Right;  . STUMP REVISION Right 04/20/2015   Procedure: Revision Right Below Knee Amputation, Apply Wound VAC;  Surgeon: Newt Minion, MD;  Location: [facility];  Service: Orthopedics;  Laterality: Right;  . TONSILLECTOMY      Family History  Problem Relation Age of Onset  . Cancer Mother   . Peripheral vascular disease Father     Social History:  reports that he quit smoking about 20 years ago. He quit after 20.00  years of use. He quit smokeless tobacco use about 20 years ago. He reports that he does not drink alcohol and does not use drugs.  Allergies: No Known Allergies  Medications: I have reviewed the patient's current medications.  Results for orders placed or performed during the hospital encounter of 06/22/20 (from the past 48 hour(s))  CBG monitoring, ED     Status: Abnormal   Collection Time: 06/22/20 12:56 PM  Result Value Ref Range   Glucose-Capillary 350 (H) 70 - 99 mg/dL    Comment: Glucose reference range applies only to samples taken after fasting for at least 8  hours.  CBC with Differential     Status: Abnormal (Preliminary result)   Collection Time: 06/22/20  2:00 PM  Result Value Ref Range   WBC 31.4 (H) 4.0 - 10.5 K/uL   RBC 4.18 (L) 4.22 - 5.81 MIL/uL   Hemoglobin 11.5 (L) 13.0 - 17.0 g/dL   HCT 35.7 (L) 39.0 - 52.0 %   MCV 85.4 80.0 - 100.0 fL   MCH 27.5 26.0 - 34.0 pg   MCHC 32.2 30.0 - 36.0 g/dL   RDW 13.5 11.5 - 15.5 %   Platelets 503 (H) 150 - 400 K/uL   nRBC 0.0 0.0 - 0.2 %    Comment: Performed at [facility], [place] [address] [place], [place] [address]   Neutrophils Relative % PENDING %   Neutro Abs PENDING 1.7 - 7.7 K/uL   Band Neutrophils PENDING %   Lymphocytes Relative PENDING %   Lymphs Abs PENDING 0.7 - 4.0 K/uL   Monocytes Relative PENDING %   Monocytes Absolute PENDING 0.1 - 1.0 K/uL   Eosinophils Relative PENDING %   Eosinophils Absolute PENDING 0.0 - 0.5 K/uL   Basophils Relative PENDING %   Basophils Absolute PENDING 0.0 - 0.1 K/uL   WBC Morphology PENDING    RBC Morphology PENDING    Smear Review PENDING    Other PENDING %   nRBC PENDING 0 /100 WBC   Metamyelocytes Relative PENDING %   Myelocytes PENDING %   Promyelocytes Relative PENDING %   Blasts PENDING %   Immature Granulocytes PENDING %   Abs Immature Granulocytes PENDING 0.00 - 0.07 K/uL    DG Foot Complete Left  Result Date: 06/22/2020 CLINICAL DATA:  Diabetic foot infection. EXAM: LEFT FOOT - COMPLETE 3+ VIEW COMPARISON:  None. FINDINGS: Skin defect about the lateral midfoot. No radiopaque foreign object. Diffuse subcutaneous edema about the midfoot and forefoot. Areas of both plantar and dorsal subcutaneous gas. no osseous destruction. IMPRESSION: Lateral midfoot soft tissue ulcer with diffuse soft tissue swelling and subcutaneous gas, suggesting necrotizing fasciitis. No plain film evidence of osteomyelitis. Electronically Signed   By: Abigail Miyamoto M.D.   On: 06/22/2020 14:19    Review of Systems  Constitutional: Negative for chills,  diaphoresis and fever.  HENT: Negative for ear discharge, ear pain, hearing loss and tinnitus.   Eyes: Negative for photophobia and pain.  Respiratory: Negative for cough and shortness of breath.   Cardiovascular: Negative for chest pain.  Gastrointestinal: Negative for abdominal pain, nausea and vomiting.  Genitourinary: Negative for dysuria, flank pain, frequency and urgency.  Musculoskeletal: Negative for arthralgias, back pain, myalgias and neck pain.  Neurological: Negative for dizziness and headaches.  Hematological: Does not bruise/bleed easily.  Psychiatric/Behavioral: The patient is not nervous/anxious.    Blood pressure 115/75, pulse 72, resp. rate 20, SpO2 96 %. Physical Exam Constitutional:  General: He is not in acute distress.    Appearance: He is well-developed. He is not diaphoretic.  HENT:     Head: Normocephalic and atraumatic.  Eyes:     General: No scleral icterus.       Right eye: No discharge.        Left eye: No discharge.     Conjunctiva/sclera: Conjunctivae normal.  Cardiovascular:     Rate and Rhythm: Normal rate and regular rhythm.  Pulmonary:     Effort: Pulmonary effort is normal. No respiratory distress.  Musculoskeletal:     Cervical back: Normal range of motion.     Comments: LLE No traumatic wounds, ecchymosis, or rash  Nontender, ulceration plantar midfoot with fluctuance, subq purulence, and malodor.  No knee or ankle effusion  Knee stable to varus/ valgus and anterior/posterior stress  Sens DPN, TN absent, SPN severely paresthetic  Motor EHL, ext, flex, evers 5/5  DP 0, PT 0, No significant edema  Skin:    General: Skin is warm and dry.  Neurological:     Mental Status: He is alert.  Psychiatric:        Behavior: Behavior normal.     Assessment/Plan: Left foot infection -- Will get dopplers, will almost assuredly need vascular surgery input. Dr. Sharol Given to evaluate later today or in AM.    Jake Abu, PA-C Orthopedic  Surgery [phone] 06/22/2020, 2:54 PM

## 2020-06-22 NOTE — H&P (Signed)
Date: 06/22/2020               Patient Name:  [name] MRN: [id number]  DOB: [birth year] Age / Sex: 65 y.o., male   PCP: No primary care provider on file.         Medical Service: Internal Medicine Teaching Service         Attending Physician: Dr. Velna Ochs, MD    First Contact: Dr. Allyson Sabal Pager: [phone]  Second Contact: Dr. Charleen Kirks Pager: [phone]       After Hours (After 5p/  First Contact Pager: [phone]  weekends / holidays): Second Contact Pager: [phone]   Chief Complaint: left foot wound  History of Present Illness:   [name] is a 65 y/o male with a PMHx of hypertension, peripheral arterial disease, type 2 diabetes with neuropathy, right BKA, and combined systolic and diastolic congestive heart failure who presents to the ED with c/o of a left foot wound.   [name] states that his infection on his left foot started while ago when he developed a wound. He was intermittently picking the skin off of it.  Last Friday his wound became a lot worse and he began noticing a foul odor associated with it.  Did not want to come in at that time because he thought that he is foot wound was going to start improving.  He denies having any pain in his foot and does not have sensation below the mid shin on the left side.  Does endorse having intermittent fevers last week but has not had any for the past couple of days.  He also endorses chills, nausea, decreased appetite for the past few days.  He denies any vomiting.  He states that he came in today because his foot worsened and his assisted living facility called EMS to bring him in.  He reports that he had a right-sided BKA about 7 years ago.  He is unsure as to the reason behind why he needed a right-sided BKA.  Patient has a history of HFrEF. He denies any recent SOB or orthopnea, chest pain.  He also has a history of diabetes but he does not know what medications he takes.  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   Patient states that he wants his CODE STATUS to be DO NOT RESUSCITATE / DO NOT INTUBATE.  He is alert and oriented x4 and has decision-making capacity.   ED Course:   CBC showing WBC 31.4.  CMP showing sodium 133, blood glucose 357, creatinine 2.03, BUN 79, albumin 2.1.  Left foot x-ray showing soft tissue ulcer with diffuse soft tissue swelling and subcutaneous gas, concerning for necrotizing fasciitis no evidence of osteomyelitis.  Patient was started on IV Zosyn, vancomycin, clindamycin.  [name], orthopedics, evaluated patient and decompressed abscess in left foot along with recommending left below the knee amputation this upcoming Friday.  IMTS asked to admit patient.  Meds:  No current facility-administered medications on file prior to encounter.   Current Outpatient Medications on File Prior to Encounter  Medication Sig Dispense Refill  . Amino Acids-Protein Hydrolys (PRO-STAT) LIQD Take 30 mLs by mouth 2 (two) times daily.    Marland Kitchen aspirin 325 MG tablet Take 1 tablet (325 mg total) by mouth daily. 30 tablet 0  . atorvastatin (LIPITOR) 40 MG tablet Take 40 mg by mouth at bedtime.     . bisoprolol (ZEBETA) 5  MG tablet Take 1 tablet (5 mg total) by mouth daily. 30 tablet 1  . brimonidine (ALPHAGAN) 0.2 % ophthalmic solution PLACE 1 DROP INTO THE LEFT EYE 2 (TWO) TIMES DAILY. 5 mL 0  . dorzolamide-timolol (COSOPT) 22.3-6.8 MG/ML ophthalmic solution Place 1 drop into the left eye 2 (two) times daily. 10 mL 12  . levETIRAcetam (KEPPRA) 750 MG tablet Take 1 tablet (750 mg total) by mouth 2 (two) times daily. 60 tablet 0  . metFORMIN (GLUCOPHAGE) 850 MG tablet Take 850 mg by mouth 2 (two) times daily with a meal.    . Multiple Vitamins-Minerals (DECUBI-VITE) CAPS Take 1 capsule by mouth daily.    . polyethylene glycol (MIRALAX / GLYCOLAX) packet Take 17 g by mouth 2 (two) times daily. 14 each 0  . QUEtiapine (SEROQUEL) 25 MG tablet Take 25 mg by mouth every 12  (twelve) hours.    . sacubitril-valsartan (ENTRESTO) 49-51 MG Take 1 tablet by mouth 2 (two) times daily. 60 tablet 3  . tamsulosin (FLOMAX) 0.4 MG CAPS capsule Take 2 capsules (0.8 mg total) by mouth daily after supper. 60 capsule 0  . torsemide (DEMADEX) 20 MG tablet Take 4 tablets (80 mg total) by mouth 2 (two) times daily. 240 tablet 0   Allergies: Allergies as of 06/22/2020  . (No Known Allergies)   Past Medical History:  Diagnosis Date  . Acute on chronic systolic heart failure, NYHA class 3 ([place])   . AKI (acute kidney injury) ([place])   . Anemia   . BKA stump complication ([place])   . Blind left eye   . Blind left eye   . Cardiomyopathy, dilated ([place])   . CHF (congestive heart failure) ([place])   . Combined systolic and diastolic congestive heart failure ([place])   . Congestive dilated cardiomyopathy ([place]) 07/11/2015  . Diabetes mellitus   . Diabetic neuropathy associated with type 2 diabetes mellitus ([place])   . 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 Hypertension   . Neuropathy   . Noncompliance with medications   . Open knee wound 10/2015   on rt bka  . PAD (peripheral artery disease) ([place])   . Protein calorie malnutrition ([place])   . Shortness of breath dyspnea    Family History:  Family History  Problem Relation Age of Onset  . Cancer Mother   . Peripheral vascular disease Father     Social History:  Social History   Socioeconomic History  . Marital status: Married    Spouse name: Not on file  . Number of children: 0  . Years of education: 62  . Highest education level: Not on file  Occupational History  . Not on file  Tobacco Use  . Smoking status: Former Smoker    Years: 20.00    Quit date: 05/10/2000    Years since quitting: 20.1  . Smokeless tobacco: Former Systems developer    Quit date: 02/22/2000  Vaping Use  . Vaping Use: Never used  Substance and Sexual Activity  . Alcohol use: No    Alcohol/week: 0.0 standard drinks  . Drug use: No  .  Sexual activity: Not on file  Other Topics Concern  . Not on file  Social History Narrative   ** Merged History Encounter **       Lives alone.    Caffeine use: Tea/soda daily   Coffee sometimes    Left handed    Social Determinants of Radio broadcast assistant  Strain: Not on file  Food Insecurity: Not on file  Transportation Needs: Not on file  Physical Activity: Not on file  Stress: Not on file  Social Connections: Not on file  Intimate Partner Violence: Not on file   Review of Systems: A complete ROS was negative except as per HPI.   Physical Exam: Blood pressure 123/90, pulse 75, resp. rate 17, height 6' (1.829 m), weight 86.2 kg, SpO2 97 %.  Physical Exam Constitutional:      Appearance: Normal appearance. He is not ill-appearing.  HENT:     Head: Normocephalic and atraumatic.     Nose: Nose normal. No congestion.     Mouth/Throat:     Mouth: Mucous membranes are dry.     Pharynx: Oropharynx is clear. No oropharyngeal exudate.  Eyes:     Extraocular Movements: Extraocular movements intact.     Conjunctiva/sclera: Conjunctivae normal.     Comments: Blind in left eye, nearly blind in right eye.  Cardiovascular:     Rate and Rhythm: Normal rate and regular rhythm.     Pulses: Normal pulses.     Heart sounds: Normal heart sounds. No murmur heard. No friction rub. No gallop.   Pulmonary:     Effort: Pulmonary effort is normal.     Breath sounds: Normal breath sounds. No wheezing, rhonchi or rales.  Abdominal:     General: Bowel sounds are normal. There is no distension.     Palpations: Abdomen is soft.     Tenderness: There is no abdominal tenderness.  Musculoskeletal:     Cervical back: Normal range of motion.     Comments: 1+ swelling in left lower extremity up to ankle. No tenderness with ROM.  Skin:    Comments: Left foot with significant ulceration and necrotizing skin infection (see images below). There is significant drainage from wound site. There is  an overlying redness on dorsum of left foot.  Neurological:     General: No focal deficit present.     Mental Status: He is alert and oriented to person, place, and time.     Comments: He has no sensation from his left mid-shin down to toes.          EKG: None  DG Foot Complete Left  Result Date: 06/22/2020 CLINICAL DATA:  Diabetic foot infection. EXAM: LEFT FOOT - COMPLETE 3+ VIEW COMPARISON:  None. FINDINGS: Skin defect about the lateral midfoot. No radiopaque foreign object. Diffuse subcutaneous edema about the midfoot and forefoot. Areas of both plantar and dorsal subcutaneous gas. no osseous destruction. IMPRESSION: Lateral midfoot soft tissue ulcer with diffuse soft tissue swelling and subcutaneous gas, suggesting necrotizing fasciitis. No plain film evidence of osteomyelitis. Electronically Signed   By: Abigail Miyamoto M.D.   On: 06/22/2020 14:19    Assessment & Plan by Problem: Active Problems:   Necrotizing fasciitis of lower leg (HCC)   Necrotizing skin infection of the Left Foot  Type 2 diabetes mellitus with neuropathy Peripheral arterial disease Patient with left foot wound found to have necrotizing skin infection on left foot x-ray.  This was likely due to underlying uncontrolled diabetes versus significant peripheral arterial disease.  He does have a history of right below-the-knee transtibial amputation about 7 years ago secondary to diabetic insensate neuropathy.  On chart review appears patient is on metformin 850 mg twice daily.  -WBC 31.4 -ESR 104 -Follow-up blood cultures -Follow-up hemoglobin A1c (last A1c 6.7% as of September 2021) -Follow-up ABI results -Continue IV antibiotics  with Vanc, Zosyn, and clindamycin -Continue maintenance fluid rehydration with LR and 125 cc/hr -[name] evaluated patient and is recommending left BKA this Friday, appreciate assistance -Subcutaneous heparin every 8 hours for DVT prophylaxis  Chronic combined systolic and diastolic  heart failure Hypertension Echo on 02/2017 showing LVEF 15-20%, with diffuse hypokinesis, severe tricuspid regurg, severe PAH, and grade 3 diastolic dysfunction.  He does not seem to be in an acute exacerbation of his heart failure as he is not having any symptoms at this time (no orthopnea, no JVD). Upon chart review patient appears to be on Entresto 49-51 mg twice daily, torsemide 80 mg twice daily, bisoprolol 5 mg daily. -Follow-up repeat echo to update cardiac function -Blood pressure is well controlled, will continue holding home medications  Acute kidney injury Last baseline creatinine from about 3 years ago around 1.  Creatinine on admission 2.03.  Unsure if this is an acute kidney injury solely or patient has some underlying chronic kidney disease that developed over the past 3 years.  Patient's AKI likely secondary to dehydration from insensate losses from left necrotic skin infection. Will obtain echo to evaluate heart disease and further guide fluid rehydration therapy.   -Trend BMP -Nephrotoxic medications   Dispo: Admit patient to Observation with expected length of stay less than 2 midnights.  Signed: Virl Axe, MD 06/22/2020, 5:42 PM  Pager: [phone] After 5pm on weekdays and 1pm on weekends: On Call pager: [phone]

## 2020-06-22 NOTE — H&P (View-Only) (Signed)
ORTHOPAEDIC CONSULTATION  REQUESTING PHYSICIAN: Pattricia Boss, MD  Chief Complaint: Necrotic abscess left foot.  HPI: [name] is a 65 y.o. male who presents with purulent draining necrotic abscess left foot.  Patient has a history of diabetes he is status post a right transtibial amputation initially in 2015 and revised in 2017.  Patient is currently a resident at skilled nursing.  Patient underwent arteriogram of the left lower extremity and 2019 and had patent flow to the ankle but did not have revascularization options.  Past Medical History:  Diagnosis Date  . Acute on chronic systolic heart failure, NYHA class 3 ([place])   . AKI (acute kidney injury) ([place])   . Anemia   . BKA stump complication ([place])   . Blind left eye   . Blind left eye   . 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 CHF (congestive heart failure) ([place])   . Combined systolic and diastolic congestive heart failure ([place])   . Congestive dilated cardiomyopathy ([place]) 07/11/2015  . Diabetes mellitus   . Diabetic neuropathy associated with type 2 diabetes mellitus ([place])   . 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 Hypertension   . Neuropathy   . Noncompliance with medications   . Open knee wound 10/2015   on rt bka  . PAD (peripheral artery disease) ([place])   . Protein calorie malnutrition ([place])   . Shortness of breath dyspnea    Past Surgical History:  Procedure Laterality Date  . ABDOMINAL AORTOGRAM W/LOWER EXTREMITY N/A 03/25/2017   Procedure: ABDOMINAL AORTOGRAM W/LOWER EXTREMITY;  Surgeon: Angelia Mould, MD;  Location: [facility];  Service: Cardiovascular;  Laterality: N/A;  . AMPUTATION Right 09/26/2013   Procedure: AMPUTATION BELOW KNEE;  Surgeon: Rozanna Box, MD;  Location: [facility];  Service: Orthopedics;  Laterality: Right;  . AMPUTATION Right 05/01/2015   Procedure: REVISION OF RIGHT TRANSTIBIAL AMPUTATION ;  Surgeon: Newt Minion, MD;  Location: [facility];   Service: Orthopedics;  Laterality: Right;  . APPLICATION OF WOUND VAC  08/10/2016   Procedure: APPLICATION OFI NCISONAL  WOUND VAC;  Surgeon: Newt Minion, MD;  Location: [facility];  Service: Orthopedics;;  . CARDIAC CATHETERIZATION N/A 07/13/2015   Procedure: Right/Left Heart Cath and Coronary Angiography;  Surgeon: Jolaine Artist, MD;  Location: [facility];  Service: Cardiovascular;  Laterality: N/A;  . COLONOSCOPY    . I & D EXTREMITY Right 09/24/2013   Procedure: IRRIGATION AND DEBRIDEMENT RIGHT FOOT ULCER;  Surgeon: Renette Butters, MD;  Location: [facility];  Service: Orthopedics;  Laterality: Right;  . I & D EXTREMITY Right 09/26/2013   Procedure: Repeat IRRIGATION AND DEBRIDEMENT Right Foot Ulcer;  Surgeon: Rozanna Box, MD;  Location: [facility];  Service: Orthopedics;  Laterality: Right;  . MULTIPLE TOOTH EXTRACTIONS    . SKIN SPLIT GRAFT Right 08/10/2016   Procedure: SKIN GRAFT SPLIT THICKNESS RIGHT LEG;  Surgeon: Newt Minion, MD;  Location: [facility];  Service: Orthopedics;  Laterality: Right;  . STUMP REVISION Right 04/20/2015   Procedure: Revision Right Below Knee Amputation, Apply Wound VAC;  Surgeon: Newt Minion, MD;  Location: [facility];  Service: Orthopedics;  Laterality: Right;  . TONSILLECTOMY     Social History   Socioeconomic History  . Marital status: Married    Spouse name: Not on file  . Number of children: 0  . Years of education: 11  . Highest education  level: Not on file  Occupational History  . Not on file  Tobacco Use  . Smoking status: Former Smoker    Years: 20.00    Quit date: 05/10/2000    Years since quitting: 20.1  . Smokeless tobacco: Former Systems developer    Quit date: 02/22/2000  Vaping Use  . Vaping Use: Never used  Substance and Sexual Activity  . Alcohol use: No    Alcohol/week: 0.0 standard drinks  . Drug use: No  . Sexual activity: Not on file  Other Topics Concern  . Not on file  Social History Narrative   ** Merged History Encounter **        Lives alone.    Caffeine use: Tea/soda daily   Coffee sometimes    Left handed    Social Determinants of Health   Financial Resource Strain: Not on file  Food Insecurity: Not on file  Transportation Needs: Not on file  Physical Activity: Not on file  Stress: Not on file  Social Connections: Not on file   Family History  Problem Relation Age of Onset  . Cancer Mother   . Peripheral vascular disease Father    - negative except otherwise stated in the family history section No Known Allergies Prior to Admission medications   Medication Sig Start Date End Date Taking? Authorizing Provider  Amino Acids-Protein Hydrolys (PRO-STAT) LIQD Take 30 mLs by mouth 2 (two) times daily.    [provider]  aspirin 325 MG tablet Take 1 tablet (325 mg total) by mouth daily. 10/21/16   Raiford Noble Latif, DO  atorvastatin (LIPITOR) 40 MG tablet Take 40 mg by mouth at bedtime.  08/25/15   [provider]  bisoprolol (ZEBETA) 5 MG tablet Take 1 tablet (5 mg total) by mouth daily. 10/19/16   Hosie Poisson, MD  brimonidine (ALPHAGAN) 0.2 % ophthalmic solution PLACE 1 DROP INTO THE LEFT EYE 2 (TWO) TIMES DAILY. 06/15/15   Mariel Aloe, MD  dorzolamide-timolol (COSOPT) 22.3-6.8 MG/ML ophthalmic solution Place 1 drop into the left eye 2 (two) times daily. 09/28/13   Virginia Crews, MD  levETIRAcetam (KEPPRA) 750 MG tablet Take 1 tablet (750 mg total) by mouth 2 (two) times daily. 10/20/16   Raiford Noble Latif, DO  metFORMIN (GLUCOPHAGE) 850 MG tablet Take 850 mg by mouth 2 (two) times daily with a meal.    [provider]  Multiple Vitamins-Minerals (DECUBI-VITE) CAPS Take 1 capsule by mouth daily.    [provider]  polyethylene glycol (MIRALAX / GLYCOLAX) packet Take 17 g by mouth 2 (two) times daily. 02/07/17   Caroline More, DO  QUEtiapine (SEROQUEL) 25 MG tablet Take 25 mg by mouth every 12 (twelve) hours.    [provider]  sacubitril-valsartan  (ENTRESTO) 49-51 MG Take 1 tablet by mouth 2 (two) times daily. 08/23/16   Arbutus Leas, NP  tamsulosin (FLOMAX) 0.4 MG CAPS capsule Take 2 capsules (0.8 mg total) by mouth daily after supper. 12/31/16   Caroline More, DO  torsemide (DEMADEX) 20 MG tablet Take 4 tablets (80 mg total) by mouth 2 (two) times daily. 02/07/17   Caroline More, DO   DG Foot Complete Left  Result Date: 06/22/2020 CLINICAL DATA:  Diabetic foot infection. EXAM: LEFT FOOT - COMPLETE 3+ VIEW COMPARISON:  None. FINDINGS: Skin defect about the lateral midfoot. No radiopaque foreign object. Diffuse subcutaneous edema about the midfoot and forefoot. Areas of both plantar and dorsal subcutaneous gas. no osseous destruction. IMPRESSION: Lateral  midfoot soft tissue ulcer with diffuse soft tissue swelling and subcutaneous gas, suggesting necrotizing fasciitis. No plain film evidence of osteomyelitis. Electronically Signed   By: Abigail Miyamoto M.D.   On: 06/22/2020 14:19   - pertinent xrays, CT, MRI studies were reviewed and independently interpreted  Positive ROS: All other systems have been reviewed and were otherwise negative with the exception of those mentioned in the HPI and as above.  Physical Exam: General: Alert, no acute distress Psychiatric: Patient is competent for consent with normal mood and affect Lymphatic: No axillary or cervical lymphadenopathy Cardiovascular: No pedal edema Respiratory: No cyanosis, no use of accessory musculature GI: No organomegaly, abdomen is soft and non-tender    Images:  @ENCIMAGES @  Labs:  Lab Results  Component Value Date   HGBA1C 7.2 (H) 10/14/2016   HGBA1C 7.9 (H) 09/24/2016   HGBA1C 10.1 (H) 05/24/2016   ESRSEDRATE 114 (H) 09/23/2013   CRP 37.3 (H) 09/23/2013   REPTSTATUS 02/04/2017 FINAL 01/30/2017   GRAMSTAIN  09/24/2013    MODERATE WBC PRESENT,BOTH PMN AND MONONUCLEAR MODERATE GRAM POSITIVE COCCI IN PAIRS FEW GRAM VARIABLE ROD Performed at [facility]  09/24/2013    MODERATE WBC PRESENT,BOTH PMN AND MONONUCLEAR ABUNDANT GRAM POSITIVE COCCI IN PAIRS FEW GRAM VARIABLE ROD Performed at [facility]   CULT NO GROWTH 5 DAYS 01/30/2017    Lab Results  Component Value Date   ALBUMIN 2.1 (L) 06/22/2020   ALBUMIN 3.3 (L) 04/05/2017   ALBUMIN 2.0 (L) 02/05/2017     CBC EXTENDED Latest Ref Rng & Units 06/22/2020 04/05/2017 03/25/2017  WBC 4.0 - 10.5 K/uL 31.4(H) 9.9 -  RBC 4.22 - 5.81 MIL/uL 4.18(L) 5.09 -  HGB 13.0 - 17.0 g/dL 11.5(L) 13.6 15.3  HCT 39.0 - 52.0 % 35.7(L) 40.8 45.0  PLT 150 - 400 K/uL 503(H) 251 -  NEUTROABS 1.7 - 7.7 K/uL 29.8(H) - -  LYMPHSABS 0.7 - 4.0 K/uL 1.3 - -    Neurologic: Patient does not have protective sensation bilateral lower extremities.   MUSCULOSKELETAL:   Skin: Examination of the entire plantar aspect of patient's left foot is necrotic this was decompressed and patient's abscess that extended to the hindfoot was drained.  There is necrotic tissue on the dorsum of the foot up to the ankle.  The calf is soft nontender, no cellulitis, no crepitation with palpation, no signs of infection proximal to the ankle.  Patient's arteriogram studies from 2019 were reviewed which did not show any revascularization options.  Patient's white blood cell count is 31.4 with a hemoglobin of 11.5.  Albumin 2.1 , there is no new hemoglobin A1c at this time.  Review of the radiographs shows abscess extending up to the hindfoot with air in the soft tissue.  Patient has a stable right transtibial amputation.  Assessment: Necrotic abscess involving almost the entire left foot.  Plan: I have decompressed the abscess in the foot, I agree with IV antibiotics  and will plan for a left below the knee amputation on Friday.  Patient does not have a foot that is salvageable.  Thank you for the consult and the opportunity to see Mr. [place], [place] [phone] 4:32  PM

## 2020-06-22 NOTE — ED Triage Notes (Signed)
Pt arrived to ED via EMS from [facility]. Pt with infection to left foot x 1 week after picking at small wound. Per facility area increasing with redness and smell. No other complaints  Pt with hx of diabetes, A/o x2 which is his baseline

## 2020-06-22 NOTE — Progress Notes (Addendum)
Pharmacy Antibiotic Note  [name] is a 65 y.o. male admitted on 06/22/2020 with wound infection. Patient believed to have infection of left foot after picking at a small wound ~1 week ago. Nursing facility notes increased redness, welling, and smell at infection site. X-ray of foot (4/20) shows lateral midfoot soft tissue ulcer suggestive of necrotizing fasciitis. No evidence of osteo at this time. Pharmacy has been consulted for vancomycin and Zosyn dosing.  Plan: Vancomycin 2g IV x1, then 1g IV q24h (Est AUC 420)  Zosyn 3.375g IV q8h Clindamycin 600mg  IV q8h per MD  - Monitor renal function for antibiotic adjustment - Monitor clinical progression, culture data, and LOT for de-escalation    No data recorded.  No results for input(s): WBC, CREATININE, LATICACIDVEN, VANCOTROUGH, VANCOPEAK, VANCORANDOM, GENTTROUGH, GENTPEAK, GENTRANDOM, TOBRATROUGH, TOBRAPEAK, TOBRARND, AMIKACINPEAK, AMIKACINTROU, AMIKACIN in the last 168 hours.  CrCl cannot be calculated (Patient's most recent lab result is older than the maximum 21 days allowed.).    No Known Allergies  Antimicrobials this admission: Vancomycin 4/20 >> Zosyn 4/20 >>  Clindamycin 4/20 >>   Microbiology results:   Thank you for allowing pharmacy to be a part of this patient's care.  Claudina Lick, PharmD PGY1 Acute Care Pharmacy Resident 06/22/2020 2:45 PM  Please check [email or web address] for unit-specific pharmacy phone numbers.

## 2020-06-22 NOTE — ED Notes (Signed)
Provider at bedside

## 2020-06-22 NOTE — Consult Note (Signed)
ORTHOPAEDIC CONSULTATION  REQUESTING PHYSICIAN: Pattricia Boss, MD  Chief Complaint: Necrotic abscess left foot.  HPI: [name] is a 64 y.o. male who presents with purulent draining necrotic abscess left foot.  Patient has a history of diabetes he is status post a right transtibial amputation initially in 2015 and revised in 2017.  Patient is currently a resident at skilled nursing.  Patient underwent arteriogram of the left lower extremity and 2019 and had patent flow to the ankle but did not have revascularization options.  Past Medical History:  Diagnosis Date  . Acute on chronic systolic heart failure, NYHA class 3 ([place])   . AKI (acute kidney injury) ([place])   . Anemia   . BKA stump complication ([place])   . Blind left eye   . Blind left eye   . Cardiomyopathy, dilated ([place])   . CHF (congestive heart failure) ([place])   . Combined systolic and diastolic congestive heart failure ([place])   . Congestive dilated cardiomyopathy ([place]) 07/11/2015  . Diabetes mellitus   . Diabetic neuropathy associated with type 2 diabetes mellitus ([place])   . 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 Hypertension   . Neuropathy   . Noncompliance with medications   . Open knee wound 10/2015   on rt bka  . PAD (peripheral artery disease) ([place])   . Protein calorie malnutrition ([place])   . Shortness of breath dyspnea    Past Surgical History:  Procedure Laterality Date  . ABDOMINAL AORTOGRAM W/LOWER EXTREMITY N/A 03/25/2017   Procedure: ABDOMINAL AORTOGRAM W/LOWER EXTREMITY;  Surgeon: Angelia Mould, MD;  Location: [facility];  Service: Cardiovascular;  Laterality: N/A;  . AMPUTATION Right 09/26/2013   Procedure: AMPUTATION BELOW KNEE;  Surgeon: Rozanna Box, MD;  Location: [facility];  Service: Orthopedics;  Laterality: Right;  . AMPUTATION Right 05/01/2015   Procedure: REVISION OF RIGHT TRANSTIBIAL AMPUTATION ;  Surgeon: Newt Minion, MD;  Location: [facility];   Service: Orthopedics;  Laterality: Right;  . APPLICATION OF WOUND VAC  08/10/2016   Procedure: APPLICATION OFI NCISONAL  WOUND VAC;  Surgeon: Newt Minion, MD;  Location: [facility];  Service: Orthopedics;;  . CARDIAC CATHETERIZATION N/A 07/13/2015   Procedure: Right/Left Heart Cath and Coronary Angiography;  Surgeon: Jolaine Artist, MD;  Location: [facility];  Service: Cardiovascular;  Laterality: N/A;  . COLONOSCOPY    . I & D EXTREMITY Right 09/24/2013   Procedure: IRRIGATION AND DEBRIDEMENT RIGHT FOOT ULCER;  Surgeon: Renette Butters, MD;  Location: [facility];  Service: Orthopedics;  Laterality: Right;  . I & D EXTREMITY Right 09/26/2013   Procedure: Repeat IRRIGATION AND DEBRIDEMENT Right Foot Ulcer;  Surgeon: Rozanna Box, MD;  Location: [facility];  Service: Orthopedics;  Laterality: Right;  . MULTIPLE TOOTH EXTRACTIONS    . SKIN SPLIT GRAFT Right 08/10/2016   Procedure: SKIN GRAFT SPLIT THICKNESS RIGHT LEG;  Surgeon: Newt Minion, MD;  Location: [facility];  Service: Orthopedics;  Laterality: Right;  . STUMP REVISION Right 04/20/2015   Procedure: Revision Right Below Knee Amputation, Apply Wound VAC;  Surgeon: Newt Minion, MD;  Location: [facility];  Service: Orthopedics;  Laterality: Right;  . TONSILLECTOMY     Social History   Socioeconomic History  . Marital status: Married    Spouse name: Not on file  . Number of children: 0  . Years of education: 78  . Highest education  level: Not on file  Occupational History  . Not on file  Tobacco Use  . Smoking status: Former Smoker    Years: 20.00    Quit date: 05/10/2000    Years since quitting: 20.1  . Smokeless tobacco: Former Systems developer    Quit date: 02/22/2000  Vaping Use  . Vaping Use: Never used  Substance and Sexual Activity  . Alcohol use: No    Alcohol/week: 0.0 standard drinks  . Drug use: No  . Sexual activity: Not on file  Other Topics Concern  . Not on file  Social History Narrative   ** Merged History Encounter **        Lives alone.    Caffeine use: Tea/soda daily   Coffee sometimes    Left handed    Social Determinants of Health   Financial Resource Strain: Not on file  Food Insecurity: Not on file  Transportation Needs: Not on file  Physical Activity: Not on file  Stress: Not on file  Social Connections: Not on file   Family History  Problem Relation Age of Onset  . Cancer Mother   . Peripheral vascular disease Father    - negative except otherwise stated in the family history section No Known Allergies Prior to Admission medications   Medication Sig Start Date End Date Taking? Authorizing Provider  Amino Acids-Protein Hydrolys (PRO-STAT) LIQD Take 30 mLs by mouth 2 (two) times daily.    [provider]  aspirin 325 MG tablet Take 1 tablet (325 mg total) by mouth daily. 10/21/16   Raiford Noble Latif, DO  atorvastatin (LIPITOR) 40 MG tablet Take 40 mg by mouth at bedtime.  08/25/15   [provider]  bisoprolol (ZEBETA) 5 MG tablet Take 1 tablet (5 mg total) by mouth daily. 10/19/16   Hosie Poisson, MD  brimonidine (ALPHAGAN) 0.2 % ophthalmic solution PLACE 1 DROP INTO THE LEFT EYE 2 (TWO) TIMES DAILY. 06/15/15   Mariel Aloe, MD  dorzolamide-timolol (COSOPT) 22.3-6.8 MG/ML ophthalmic solution Place 1 drop into the left eye 2 (two) times daily. 09/28/13   Virginia Crews, MD  levETIRAcetam (KEPPRA) 750 MG tablet Take 1 tablet (750 mg total) by mouth 2 (two) times daily. 10/20/16   Raiford Noble Latif, DO  metFORMIN (GLUCOPHAGE) 850 MG tablet Take 850 mg by mouth 2 (two) times daily with a meal.    [provider]  Multiple Vitamins-Minerals (DECUBI-VITE) CAPS Take 1 capsule by mouth daily.    [provider]  polyethylene glycol (MIRALAX / GLYCOLAX) packet Take 17 g by mouth 2 (two) times daily. 02/07/17   Caroline More, DO  QUEtiapine (SEROQUEL) 25 MG tablet Take 25 mg by mouth every 12 (twelve) hours.    [provider]  sacubitril-valsartan  (ENTRESTO) 49-51 MG Take 1 tablet by mouth 2 (two) times daily. 08/23/16   Arbutus Leas, NP  tamsulosin (FLOMAX) 0.4 MG CAPS capsule Take 2 capsules (0.8 mg total) by mouth daily after supper. 12/31/16   Caroline More, DO  torsemide (DEMADEX) 20 MG tablet Take 4 tablets (80 mg total) by mouth 2 (two) times daily. 02/07/17   Caroline More, DO   DG Foot Complete Left  Result Date: 06/22/2020 CLINICAL DATA:  Diabetic foot infection. EXAM: LEFT FOOT - COMPLETE 3+ VIEW COMPARISON:  None. FINDINGS: Skin defect about the lateral midfoot. No radiopaque foreign object. Diffuse subcutaneous edema about the midfoot and forefoot. Areas of both plantar and dorsal subcutaneous gas. no osseous destruction. IMPRESSION: Lateral  midfoot soft tissue ulcer with diffuse soft tissue swelling and subcutaneous gas, suggesting necrotizing fasciitis. No plain film evidence of osteomyelitis. Electronically Signed   By: Abigail Miyamoto M.D.   On: 06/22/2020 14:19   - pertinent xrays, CT, MRI studies were reviewed and independently interpreted  Positive ROS: All other systems have been reviewed and were otherwise negative with the exception of those mentioned in the HPI and as above.  Physical Exam: General: Alert, no acute distress Psychiatric: Patient is competent for consent with normal mood and affect Lymphatic: No axillary or cervical lymphadenopathy Cardiovascular: No pedal edema Respiratory: No cyanosis, no use of accessory musculature GI: No organomegaly, abdomen is soft and non-tender    Images:  @ENCIMAGES @  Labs:  Lab Results  Component Value Date   HGBA1C 7.2 (H) 10/14/2016   HGBA1C 7.9 (H) 09/24/2016   HGBA1C 10.1 (H) 05/24/2016   ESRSEDRATE 114 (H) 09/23/2013   CRP 37.3 (H) 09/23/2013   REPTSTATUS 02/04/2017 FINAL 01/30/2017   GRAMSTAIN  09/24/2013    MODERATE WBC PRESENT,BOTH PMN AND MONONUCLEAR MODERATE GRAM POSITIVE COCCI IN PAIRS FEW GRAM VARIABLE ROD Performed at [facility]  09/24/2013    MODERATE WBC PRESENT,BOTH PMN AND MONONUCLEAR ABUNDANT GRAM POSITIVE COCCI IN PAIRS FEW GRAM VARIABLE ROD Performed at [facility]   CULT NO GROWTH 5 DAYS 01/30/2017    Lab Results  Component Value Date   ALBUMIN 2.1 (L) 06/22/2020   ALBUMIN 3.3 (L) 04/05/2017   ALBUMIN 2.0 (L) 02/05/2017     CBC EXTENDED Latest Ref Rng & Units 06/22/2020 04/05/2017 03/25/2017  WBC 4.0 - 10.5 K/uL 31.4(H) 9.9 -  RBC 4.22 - 5.81 MIL/uL 4.18(L) 5.09 -  HGB 13.0 - 17.0 g/dL 11.5(L) 13.6 15.3  HCT 39.0 - 52.0 % 35.7(L) 40.8 45.0  PLT 150 - 400 K/uL 503(H) 251 -  NEUTROABS 1.7 - 7.7 K/uL 29.8(H) - -  LYMPHSABS 0.7 - 4.0 K/uL 1.3 - -    Neurologic: Patient does not have protective sensation bilateral lower extremities.   MUSCULOSKELETAL:   Skin: Examination of the entire plantar aspect of patient's left foot is necrotic this was decompressed and patient's abscess that extended to the hindfoot was drained.  There is necrotic tissue on the dorsum of the foot up to the ankle.  The calf is soft nontender, no cellulitis, no crepitation with palpation, no signs of infection proximal to the ankle.  Patient's arteriogram studies from 2019 were reviewed which did not show any revascularization options.  Patient's white blood cell count is 31.4 with a hemoglobin of 11.5.  Albumin 2.1 , there is no new hemoglobin A1c at this time.  Review of the radiographs shows abscess extending up to the hindfoot with air in the soft tissue.  Patient has a stable right transtibial amputation.  Assessment: Necrotic abscess involving almost the entire left foot.  Plan: I have decompressed the abscess in the foot, I agree with IV antibiotics  and will plan for a left below the knee amputation on Friday.  Patient does not have a foot that is salvageable.  Thank you for the consult and the opportunity to see Mr. Foxworth, [place] [phone] 4:32  PM

## 2020-06-22 NOTE — ED Provider Notes (Signed)
[facility] EMERGENCY DEPARTMENT Provider Note   CSN: [id number] Arrival date & time: 06/22/20  1254     History Chief Complaint  Patient presents with  . Wound Infection    [name] is a 65 y.o. male.  HPI Patient is a 65 year old male with a history of CHF, type 2 diabetes mellitus, neuropathy, PAD, BKA of the right leg, who presents to the emergency department due to left foot pain and swelling.  Patient currently lives at [facility].  Over the past week he has been experiencing a wound to the left foot that has been picking at and has been worsening.  Reports associated increased warmth, redness, as well as swelling in the left foot.  History of BKA on the right leg.  Denies any fevers, chills, chest pain, or shortness of breath.  No abdominal pain or nausea/vomiting.    Past Medical History:  Diagnosis Date  . Acute on chronic systolic heart failure, NYHA class 3 ([place])   . AKI (acute kidney injury) ([place])   . Anemia   . BKA stump complication ([place])   . Blind left eye   . Blind left eye   . Cardiomyopathy, dilated ([place])   . CHF (congestive heart failure) ([place])   . Combined systolic and diastolic congestive heart failure ([place])   . Congestive dilated cardiomyopathy ([place]) 07/11/2015  . Diabetes mellitus   . Diabetic neuropathy associated with type 2 diabetes mellitus ([place])   . 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 Hypertension   . Neuropathy   . Noncompliance with medications   . Open knee wound 10/2015   on rt bka  . PAD (peripheral artery disease) ([place])   . Protein calorie malnutrition ([place])   . Shortness of breath dyspnea     Patient Active Problem List   Diagnosis Date Noted  . 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 Irregular heart rate   . Acute encephalopathy 01/31/2017  . Cellulitis of right upper extremity   . Anasarca   . Altered mental status   . Slurred speech   .  Hypothermia 01/30/2017  . Pressure injury of skin 12/21/2016  . CHF exacerbation ([place]) 12/20/2016  . Peripheral edema   . Benign prostatic hyperplasia with lower urinary tract symptoms   . Embolic stroke ([place]) 08/65/7846  . Cardiomyopathy, dilated ([place]) 10/19/2016  . Acute on chronic systolic heart failure, NYHA class 3 ([place]) 10/19/2016  . Moderate to severe pulmonary hypertension ([place]) 10/19/2016  . Severe tricuspid regurgitation by prior echocardiogram 10/19/2016  . Diabetes mellitus type 2 in obese ([place]) 10/19/2016  . HLD (hyperlipidemia) 10/19/2016  . Seizure ([place]) 10/19/2016  . Fluid overload 10/14/2016  . 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 Cardiorenal syndrome with renal failure 09/28/2016  . Acute urinary retention 09/26/2016  . Acute on chronic combined systolic and diastolic CHF (congestive heart failure) ([place]) 09/26/2016  . Scrotal edema 09/24/2016  . Multiple open wounds of lower leg, initial encounter 09/24/2016  . Tinea of groin 09/24/2016  . Chronic kidney disease (CKD), stage III (moderate) ([place]) 06/07/2016  . Chronic combined systolic and diastolic congestive heart failure ([place])   . Protein calorie malnutrition ([place]) 07/12/2015  . Congestive dilated cardiomyopathy ([place]) 07/11/2015  . Pressure ulcer 07/09/2015  . Open knee wound 07/06/2015  . History of right below knee amputation ([place]) 02/22/2015  . PAD (peripheral artery  disease) ([place]) 02/22/2015  . Blindness of left eye 01/04/2015  . Complications, amputation stump late ([place]) 08/06/2014  . Diabetic neuropathy associated with type 2 diabetes mellitus ([place]) 12/01/2013  . Noncompliance with medications 07/13/2010  . OTHER SPEC TYPES SCHIZOPHRENIA UNSPEC CONDITION 05/24/2009  . Obesity, unspecified 06/09/2007  . DM (diabetes mellitus), type 2, uncontrolled ([place]) 05/02/2006  . Essential hypertension 05/02/2006    Past Surgical History:   Procedure Laterality Date  . ABDOMINAL AORTOGRAM W/LOWER EXTREMITY N/A 03/25/2017   Procedure: ABDOMINAL AORTOGRAM W/LOWER EXTREMITY;  Surgeon: Angelia Mould, MD;  Location: [facility];  Service: Cardiovascular;  Laterality: N/A;  . AMPUTATION Right 09/26/2013   Procedure: AMPUTATION BELOW KNEE;  Surgeon: Rozanna Box, MD;  Location: [facility];  Service: Orthopedics;  Laterality: Right;  . AMPUTATION Right 05/01/2015   Procedure: REVISION OF RIGHT TRANSTIBIAL AMPUTATION ;  Surgeon: Newt Minion, MD;  Location: [facility];  Service: Orthopedics;  Laterality: Right;  . APPLICATION OF WOUND VAC  08/10/2016   Procedure: APPLICATION OFI NCISONAL  WOUND VAC;  Surgeon: Newt Minion, MD;  Location: [facility];  Service: Orthopedics;;  . CARDIAC CATHETERIZATION N/A 07/13/2015   Procedure: Right/Left Heart Cath and Coronary Angiography;  Surgeon: Jolaine Artist, MD;  Location: [facility];  Service: Cardiovascular;  Laterality: N/A;  . COLONOSCOPY    . I & D EXTREMITY Right 09/24/2013   Procedure: IRRIGATION AND DEBRIDEMENT RIGHT FOOT ULCER;  Surgeon: Renette Butters, MD;  Location: [facility];  Service: Orthopedics;  Laterality: Right;  . I & D EXTREMITY Right 09/26/2013   Procedure: Repeat IRRIGATION AND DEBRIDEMENT Right Foot Ulcer;  Surgeon: Rozanna Box, MD;  Location: [facility];  Service: Orthopedics;  Laterality: Right;  . MULTIPLE TOOTH EXTRACTIONS    . SKIN SPLIT GRAFT Right 08/10/2016   Procedure: SKIN GRAFT SPLIT THICKNESS RIGHT LEG;  Surgeon: Newt Minion, MD;  Location: [facility];  Service: Orthopedics;  Laterality: Right;  . STUMP REVISION Right 04/20/2015   Procedure: Revision Right Below Knee Amputation, Apply Wound VAC;  Surgeon: Newt Minion, MD;  Location: [facility];  Service: Orthopedics;  Laterality: Right;  . TONSILLECTOMY         Family History  Problem Relation Age of Onset  . Cancer Mother   . Peripheral vascular disease Father     Social History   Tobacco Use  .  Smoking status: Former Smoker    Years: 20.00    Quit date: 05/10/2000    Years since quitting: 20.1  . Smokeless tobacco: Former Systems developer    Quit date: 02/22/2000  Vaping Use  . Vaping Use: Never used  Substance Use Topics  . Alcohol use: No    Alcohol/week: 0.0 standard drinks  . Drug use: No    Home Medications Prior to Admission medications   Medication Sig Start Date End Date Taking? Authorizing Provider  Amino Acids-Protein Hydrolys (PRO-STAT) LIQD Take 30 mLs by mouth 2 (two) times daily.    [provider]  aspirin 325 MG tablet Take 1 tablet (325 mg total) by mouth daily. 10/21/16   Raiford Noble Latif, DO  atorvastatin (LIPITOR) 40 MG tablet Take 40 mg by mouth at bedtime.  08/25/15   [provider]  bisoprolol (ZEBETA) 5 MG tablet Take 1 tablet (5 mg total) by mouth daily. 10/19/16   Hosie Poisson, MD  brimonidine (ALPHAGAN) 0.2 % ophthalmic solution PLACE 1 DROP INTO THE LEFT EYE 2 (TWO) TIMES  DAILY. 06/15/15   Mariel Aloe, MD  dorzolamide-timolol (COSOPT) 22.3-6.8 MG/ML ophthalmic solution Place 1 drop into the left eye 2 (two) times daily. 09/28/13   Virginia Crews, MD  levETIRAcetam (KEPPRA) 750 MG tablet Take 1 tablet (750 mg total) by mouth 2 (two) times daily. 10/20/16   Raiford Noble Latif, DO  metFORMIN (GLUCOPHAGE) 850 MG tablet Take 850 mg by mouth 2 (two) times daily with a meal.    [provider]  Multiple Vitamins-Minerals (DECUBI-VITE) CAPS Take 1 capsule by mouth daily.    [provider]  polyethylene glycol (MIRALAX / GLYCOLAX) packet Take 17 g by mouth 2 (two) times daily. 02/07/17   Caroline More, DO  QUEtiapine (SEROQUEL) 25 MG tablet Take 25 mg by mouth every 12 (twelve) hours.    [provider]  sacubitril-valsartan (ENTRESTO) 49-51 MG Take 1 tablet by mouth 2 (two) times daily. 08/23/16   Arbutus Leas, NP  tamsulosin (FLOMAX) 0.4 MG CAPS capsule Take 2 capsules (0.8 mg total) by mouth daily after supper.  12/31/16   Caroline More, DO  torsemide (DEMADEX) 20 MG tablet Take 4 tablets (80 mg total) by mouth 2 (two) times daily. 02/07/17   Caroline More, DO    Allergies    Patient has no known allergies.  Review of Systems   Review of Systems  All other systems reviewed and are negative. Ten systems reviewed and are negative for acute change, except as noted in the HPI.   Physical Exam Updated Vital Signs BP 121/84   Pulse (!) 106   Resp 15   SpO2 98%   Physical Exam Vitals and nursing note reviewed.  Constitutional:      General: He is not in acute distress.    Appearance: Normal appearance. He is not ill-appearing, toxic-appearing or diaphoretic.  HENT:     Head: Normocephalic and atraumatic.     Right Ear: External ear normal.     Left Ear: External ear normal.     Nose: Nose normal.     Mouth/Throat:     Mouth: Mucous membranes are moist.     Pharynx: Oropharynx is clear. No oropharyngeal exudate or posterior oropharyngeal erythema.  Eyes:     Extraocular Movements: Extraocular movements intact.  Cardiovascular:     Rate and Rhythm: Normal rate and regular rhythm.     Pulses: Normal pulses.     Heart sounds: Normal heart sounds. No murmur heard. No friction rub. No gallop.   Pulmonary:     Effort: Pulmonary effort is normal. No respiratory distress.     Breath sounds: Normal breath sounds. No stridor. No wheezing, rhonchi or rales.  Abdominal:     General: Abdomen is flat.     Tenderness: There is no abdominal tenderness.  Musculoskeletal:        General: Normal range of motion.     Cervical back: Normal range of motion and neck supple. No tenderness.     Comments: BKA of the right leg.  Please see images below of the left foot.  Increased warmth, erythema, as well as swelling in the foot moving proximally up the lower left leg.  Palpable DP pulse.   Skin:    General: Skin is warm and dry.  Neurological:     General: No focal deficit present.     Mental Status:  He is alert and oriented to person, place, and time.  Psychiatric:        Mood and Affect:  Mood normal.        Behavior: Behavior normal.        ED Results / Procedures / Treatments   Labs (all labs ordered are listed, but only abnormal results are displayed) Labs Reviewed  COMPREHENSIVE METABOLIC PANEL - Abnormal; Notable for the following components:      Result Value   Sodium 133 (*)    Chloride 95 (*)    Glucose, Bld 357 (*)    BUN 79 (*)    Creatinine, Ser 2.03 (*)    Albumin 2.1 (*)    GFR, Estimated 36 (*)    All other components within normal limits  CBC WITH DIFFERENTIAL/PLATELET - Abnormal; Notable for the following components:   WBC 31.4 (*)    RBC 4.18 (*)    Hemoglobin 11.5 (*)    HCT 35.7 (*)    Platelets 503 (*)    All other components within normal limits  CBG MONITORING, ED - Abnormal; Notable for the following components:   Glucose-Capillary 350 (*)    All other components within normal limits  RESP PANEL BY RT-PCR (FLU A&B, COVID) [id number]  SEDIMENTATION RATE   EKG None  Radiology DG Foot Complete Left  Result Date: 06/22/2020 CLINICAL DATA:  Diabetic foot infection. EXAM: LEFT FOOT - COMPLETE 3+ VIEW COMPARISON:  None. FINDINGS: Skin defect about the lateral midfoot. No radiopaque foreign object. Diffuse subcutaneous edema about the midfoot and forefoot. Areas of both plantar and dorsal subcutaneous gas. no osseous destruction. IMPRESSION: Lateral midfoot soft tissue ulcer with diffuse soft tissue swelling and subcutaneous gas, suggesting necrotizing fasciitis. No plain film evidence of osteomyelitis. Electronically Signed   By: Abigail Miyamoto M.D.   On: 06/22/2020 14:19    Procedures Procedures   Medications Ordered in ED Medications  vancomycin (VANCOREADY) IVPB 2000 mg/400 mL (has no administration in time range)  piperacillin-tazobactam (ZOSYN) IVPB 3.375 g (3.375 g Intravenous New Bag/Given 06/22/20 1513)  clindamycin (CLEOCIN) IVPB 600 mg (has  no administration in time range)    ED Course  I have reviewed the triage vital signs and the nursing notes.  Pertinent labs & imaging results that were available during my care of the patient were reviewed by me and considered in my medical decision making (see chart for details).  Clinical Course as of 06/22/20 1513  Wed Jun 22, 2020  1429 DG Foot Complete Left IMPRESSION: Lateral midfoot soft tissue ulcer with diffuse soft tissue swelling and subcutaneous gas, suggesting necrotizing fasciitis.  No plain film evidence of osteomyelitis. [LJ]    Clinical Course User Index [LJ] Rayna Sexton, PA-C   MDM Rules/Calculators/A&P                          Patient is a 65 y/o male with a h/o DM who presents to the ED with a worsening infection in the left foot.  X-rays obtained of the foot showing soft tissue ulcer with diffuse soft tissue swelling and subcutaneous gas concerning for necrotizing fasciitis.   Pt started on broad spectrum abx and discussed with Silvestre Gunner PA-C with orthopaedics who has evaluated the patient at bedside.   Will admit to medicine for further workup.   Final Clinical Impression(s) / ED Diagnoses Final diagnoses:  Foot infection   Rx / DC Orders ED Discharge Orders    None       Rayna Sexton, PA-C 06/22/20 1514    Pattricia Boss, MD 06/22/20 [phone]

## 2020-06-22 NOTE — Progress Notes (Signed)
ABI study attempted. Patient refusing to come with transport to department at this time. Will attempt again as schedule and patient cooperation permit.   Vickii Chafe, RN notified.   Darlin Coco, RDMS, RVT

## 2020-06-23 ENCOUNTER — Inpatient Hospital Stay (HOSPITAL_COMMUNITY): Payer: Medicaid Other

## 2020-06-23 ENCOUNTER — Other Ambulatory Visit: Payer: Self-pay | Admitting: Physician Assistant

## 2020-06-23 DIAGNOSIS — I739 Peripheral vascular disease, unspecified: Secondary | ICD-10-CM

## 2020-06-23 DIAGNOSIS — N179 Acute kidney failure, unspecified: Secondary | ICD-10-CM

## 2020-06-23 DIAGNOSIS — E1142 Type 2 diabetes mellitus with diabetic polyneuropathy: Secondary | ICD-10-CM | POA: Diagnosis not present

## 2020-06-23 DIAGNOSIS — I5022 Chronic systolic (congestive) heart failure: Secondary | ICD-10-CM

## 2020-06-23 DIAGNOSIS — Z794 Long term (current) use of insulin: Secondary | ICD-10-CM

## 2020-06-23 DIAGNOSIS — L0889 Other specified local infections of the skin and subcutaneous tissue: Secondary | ICD-10-CM | POA: Diagnosis not present

## 2020-06-23 DIAGNOSIS — I110 Hypertensive heart disease with heart failure: Secondary | ICD-10-CM

## 2020-06-23 DIAGNOSIS — I5023 Acute on chronic systolic (congestive) heart failure: Secondary | ICD-10-CM

## 2020-06-23 DIAGNOSIS — E1152 Type 2 diabetes mellitus with diabetic peripheral angiopathy with gangrene: Secondary | ICD-10-CM

## 2020-06-23 DIAGNOSIS — M726 Necrotizing fasciitis: Secondary | ICD-10-CM | POA: Diagnosis not present

## 2020-06-23 LAB — COMPREHENSIVE METABOLIC PANEL
ALT: 20 U/L (ref 0–44)
AST: 17 U/L (ref 15–41)
Albumin: 2 g/dL — ABNORMAL LOW (ref 3.5–5.0)
Alkaline Phosphatase: 82 U/L (ref 38–126)
Anion gap: 13 (ref 5–15)
BUN: 79 mg/dL — ABNORMAL HIGH (ref 8–23)
CO2: 23 mmol/L (ref 22–32)
Calcium: 8.8 mg/dL — ABNORMAL LOW (ref 8.9–10.3)
Chloride: 98 mmol/L (ref 98–111)
Creatinine, Ser: 1.91 mg/dL — ABNORMAL HIGH (ref 0.61–1.24)
GFR, Estimated: 39 mL/min — ABNORMAL LOW (ref >60)
Glucose, Bld: 327 mg/dL — ABNORMAL HIGH (ref 70–99)
Potassium: 3.5 mmol/L (ref 3.5–5.1)
Sodium: 134 mmol/L — ABNORMAL LOW (ref 135–145)
Total Bilirubin: 1.2 mg/dL (ref 0.3–1.2)
Total Protein: 7 g/dL (ref 6.5–8.1)

## 2020-06-23 LAB — URINALYSIS, ROUTINE W REFLEX MICROSCOPIC
Bilirubin Urine: NEGATIVE
Glucose, UA: NEGATIVE mg/dL
Hgb urine dipstick: NEGATIVE
Ketones, ur: NEGATIVE mg/dL
Leukocytes,Ua: NEGATIVE
Nitrite: NEGATIVE
Protein, ur: NEGATIVE mg/dL
Specific Gravity, Urine: 1.016 (ref 1.005–1.030)
pH: 5 (ref 5.0–8.0)

## 2020-06-23 LAB — CBC
HCT: 34.6 % — ABNORMAL LOW (ref 39.0–52.0)
Hemoglobin: 11.2 g/dL — ABNORMAL LOW (ref 13.0–17.0)
MCH: 27.2 pg (ref 26.0–34.0)
MCHC: 32.4 g/dL (ref 30.0–36.0)
MCV: 84 fL (ref 80.0–100.0)
Platelets: 478 10*3/uL — ABNORMAL HIGH (ref 150–400)
RBC: 4.12 MIL/uL — ABNORMAL LOW (ref 4.22–5.81)
RDW: 13.5 % (ref 11.5–15.5)
WBC: 29.9 10*3/uL — ABNORMAL HIGH (ref 4.0–10.5)
nRBC: 0 % (ref 0.0–0.2)

## 2020-06-23 LAB — GLUCOSE, CAPILLARY
Glucose-Capillary: 277 mg/dL — ABNORMAL HIGH (ref 70–99)
Glucose-Capillary: 213 mg/dL — ABNORMAL HIGH (ref 70–99)
Glucose-Capillary: 136 mg/dL — ABNORMAL HIGH (ref 70–99)
Glucose-Capillary: 200 mg/dL — ABNORMAL HIGH (ref 70–99)

## 2020-06-23 LAB — HIV ANTIBODY (ROUTINE TESTING W REFLEX): HIV Screen 4th Generation wRfx: NONREACTIVE

## 2020-06-23 LAB — ECHOCARDIOGRAM COMPLETE
Area-P 1/2: 2.56 cm2
Height: 72 in
S' Lateral: 3.2 cm
Weight: 3040 oz

## 2020-06-23 MED ORDER — ATORVASTATIN CALCIUM 40 MG PO TABS
40.0000 mg | ORAL_TABLET | Freq: Every day | ORAL | Status: DC
  Administered 2020-06-23 – 2020-06-26 (×4): 40 mg via ORAL
  Filled 2020-06-23 (×4): qty 1

## 2020-06-23 MED ORDER — LEVETIRACETAM 750 MG PO TABS
750.0000 mg | ORAL_TABLET | Freq: Two times a day (BID) | ORAL | Status: DC
  Administered 2020-06-23 – 2020-06-27 (×8): 750 mg via ORAL
  Filled 2020-06-23 (×9): qty 1

## 2020-06-23 MED ORDER — POLYVINYL ALCOHOL 1.4 % OP SOLN
1.0000 [drp] | Freq: Three times a day (TID) | OPHTHALMIC | Status: DC | PRN
  Filled 2020-06-23: qty 15

## 2020-06-23 MED ORDER — SENNOSIDES-DOCUSATE SODIUM 8.6-50 MG PO TABS
1.0000 | ORAL_TABLET | Freq: Every day | ORAL | Status: DC
  Administered 2020-06-23 – 2020-06-26 (×4): 1 via ORAL
  Filled 2020-06-23 (×4): qty 1

## 2020-06-23 MED ORDER — JUVEN PO PACK
1.0000 | PACK | Freq: Two times a day (BID) | ORAL | Status: DC
  Administered 2020-06-24 – 2020-06-25 (×2): 1 via ORAL
  Filled 2020-06-23 (×4): qty 1

## 2020-06-23 MED ORDER — TAMSULOSIN HCL 0.4 MG PO CAPS
0.8000 mg | ORAL_CAPSULE | Freq: Every day | ORAL | Status: DC
  Administered 2020-06-23 – 2020-06-24 (×2): 0.8 mg via ORAL
  Filled 2020-06-23 (×4): qty 2

## 2020-06-23 MED ORDER — PANTOPRAZOLE SODIUM 20 MG PO TBEC
20.0000 mg | DELAYED_RELEASE_TABLET | Freq: Every day | ORAL | Status: DC
  Administered 2020-06-23 – 2020-06-27 (×4): 20 mg via ORAL
  Filled 2020-06-23 (×5): qty 1

## 2020-06-23 MED ORDER — DIVALPROEX SODIUM 125 MG PO DR TAB
125.0000 mg | DELAYED_RELEASE_TABLET | Freq: Two times a day (BID) | ORAL | Status: DC
  Administered 2020-06-23 – 2020-06-27 (×7): 125 mg via ORAL
  Filled 2020-06-23 (×9): qty 1

## 2020-06-23 MED ORDER — CEFAZOLIN SODIUM-DEXTROSE 2-4 GM/100ML-% IV SOLN
2.0000 g | INTRAVENOUS | Status: AC
  Administered 2020-06-24: 2 g via INTRAVENOUS
  Filled 2020-06-23: qty 100

## 2020-06-23 MED ORDER — MIRTAZAPINE 15 MG PO TABS
7.5000 mg | ORAL_TABLET | Freq: Every day | ORAL | Status: DC
  Administered 2020-06-23 – 2020-06-26 (×4): 7.5 mg via ORAL
  Filled 2020-06-23 (×4): qty 1

## 2020-06-23 MED ORDER — ENSURE MAX PROTEIN PO LIQD
11.0000 [oz_av] | Freq: Every day | ORAL | Status: DC
  Administered 2020-06-23 – 2020-06-26 (×4): 11 [oz_av] via ORAL
  Filled 2020-06-23 (×5): qty 330

## 2020-06-23 MED ORDER — ASPIRIN EC 81 MG PO TBEC
81.0000 mg | DELAYED_RELEASE_TABLET | Freq: Every day | ORAL | Status: DC
  Administered 2020-06-25 – 2020-06-27 (×3): 81 mg via ORAL
  Filled 2020-06-23 (×3): qty 1

## 2020-06-23 MED ORDER — INSULIN GLARGINE 100 UNIT/ML ~~LOC~~ SOLN
10.0000 [IU] | Freq: Every day | SUBCUTANEOUS | Status: DC
  Administered 2020-06-23 – 2020-06-25 (×2): 10 [IU] via SUBCUTANEOUS
  Filled 2020-06-23 (×4): qty 0.1

## 2020-06-23 NOTE — Progress Notes (Signed)
  Echocardiogram 2D Echocardiogram has been performed.  [name] 06/23/2020, 11:44 AM

## 2020-06-23 NOTE — Progress Notes (Signed)
Initial Nutrition Assessment  DOCUMENTATION CODES:   Non-severe (moderate) malnutrition in context of chronic illness  INTERVENTION:   -Ensure Max po daily, each supplement provides 150 kcal and 30 grams of protein -MVI with minerals daily -1 packet Juven BID, each packet provides 95 calories, 2.5 grams of protein (collagen), and 9.8 grams of carbohydrate (3 grams sugar); also contains 7 grams of L-arginine and L-glutamine, 300 mg vitamin C, 15 mg vitamin E, 1.2 mcg vitamin B-12, 9.5 mg zinc, 200 mg calcium, and 1.5 g  Calcium Beta-hydroxy-Beta-methylbutyrate to support wound healing   NUTRITION DIAGNOSIS:   Moderate Malnutrition related to chronic illness (PAD) as evidenced by mild fat depletion,mild muscle depletion,moderate muscle depletion.  GOAL:   Patient will meet greater than or equal to 90% of their needs  MONITOR:   Supplement acceptance,PO intake,Labs,Weight trends,Skin,I & O's  REASON FOR ASSESSMENT:   Consult Assessment of nutrition requirement/status  ASSESSMENT:   [name] is a 65 y/o male with a PMHx of hypertension, peripheral arterial disease, type 2 diabetes with neuropathy, right BKA, and combined systolic and diastolic congestive heart failure who presents to the ED with c/o of a left foot wound.  Pt admitted with necrotizing skin infection of lt foot.   Reviewed I/O's: -400 ml x 24 hours  UOP: 400 ml x 24 hours  Per orthopedics notes, plan lt BKA on Friday, 06/24/20.   Spoke with pt at bedside, who responded mostly to close ended questions. He reports he did not receive lunch today- spoke with nurse tech, who reports pt has been receiving automatic meals, but unsure how much he ate.   Pt reports fair appetite PTA, but unable to provide diet recall despite probing. He is unsure if he has lost weight.   Reviewed wt hx; wt has been stable over the past 3 years.   Discussed with pt importance of good meal and supplement intake to promote healing.    Lab Results  Component Value Date   HGBA1C 8.0 (H) 06/22/2020   PTA DM medications are 850 mg metformin BID.   Labs reviewed: Na: 134, CBGS: 277 (inpatient orders for glycemic control are 0-15 units insulin aspart TID with meals and 10 units inuslin glargine daily).   NUTRITION - FOCUSED PHYSICAL EXAM:  Flowsheet Row Most Recent Value  Orbital Region Mild depletion  Upper Arm Region Mild depletion  Thoracic and Lumbar Region No depletion  Buccal Region No depletion  Temple Region Moderate depletion  Clavicle Bone Region No depletion  Clavicle and Acromion Bone Region No depletion  Scapular Bone Region No depletion  Dorsal Hand Moderate depletion  Patellar Region Mild depletion  Anterior Thigh Region Mild depletion  Posterior Calf Region Mild depletion  Edema (RD Assessment) None  Hair Reviewed  Eyes Reviewed  Mouth Reviewed  Skin Reviewed  Nails Reviewed       Diet Order:   Diet Order            Diet heart healthy/carb modified Room service appropriate? Yes; Fluid consistency: Thin  Diet effective now                 EDUCATION NEEDS:   Education needs have been addressed  Skin:  Skin Assessment: Skin Integrity Issues: Skin Integrity Issues:: Other (Comment) Other: necrotic ulcer to lt foot  Last BM:  Unknown  Height:   Ht Readings from Last 1 Encounters:  06/22/20 6' (1.829 m)    Weight:   Wt Readings from Last 1 Encounters:  06/23/20 89.2 kg    Ideal Body Weight:  75.7 kg  BMI:  Body mass index is 26.67 kg/m.  Estimated Nutritional Needs:   Kcal:  3167-4255  Protein:  115-130 grams  Fluid:  > 2 L    Loistine Chance, RD, LDN, [place] Registered Dietitian II Certified Diabetes Care and Education Specialist Please refer to [facility] for RD and/or RD on-call/weekend/after hours pager

## 2020-06-23 NOTE — Progress Notes (Signed)
   Subjective:   [name] reports doing about the same as yesterday.  Still frustrated that he has to have his foot amputated.  Denies any acute complaints, including pain in his right leg, nausea, vomiting, fever, chills.   Objective:  Vital signs in last 24 hours: Vitals:   06/22/20 1800 06/22/20 1828 06/22/20 2041 06/23/20 0634  BP: 97/65 115/82 118/77 128/85  Pulse: 75 65 (!) 109 (!) 106  Resp: 17 20 18 17   Temp:  (!) 97.5 F (36.4 C) 97.7 F (36.5 C) 98.7 F (37.1 C)  TempSrc:  Oral Oral Oral  SpO2: 100% 98% 93% 97%  Weight:      Height:       General: Elderly male lying in bed, no acute distress. Cardiovascular: Normal rate and regular rhythm, no murmurs rubs or gallops. Pulmonary: Respiratory effort is normal, clear to auscultation bilaterally. MSK: 1+ swelling in left lower extremity up to ankle.  No tenderness noted. Skin: left foot skin infection wrapped in dressings.  Foul odor noted.  After unwrapping dressings, foot wound relatively unchanged since yesterday. Neurological: No sensation from his left mid shin down to toes.  Assessment/Plan:  Active Problems:   Necrotizing fasciitis of lower leg [facility])  [name] is a 65 year old gentleman with history of hypertension, chronic combined systolic and diastolic heart failure, type 2 diabetes mellitus with neuropathy and right BKA, peripheral arterial disease presenting with left foot necrotizing skin infection scheduled for left BKA on 06/24/2020 with Dr. Sharol Given.  Necrotizing skin infection of the left foot Type 2 diabetes mellitus with neuropathy, complicated by right BKA Peripheral arterial disease Likely due to underlying uncontrolled diabetes versus significant peripheral arterial disease.  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  Going for left BKA tomorrow with Dr. Sharol Given, appreciate orthopedic assistance. -Leukocytosis improving slightly -Hemoglobin A1c 8.4% -Follow-up  blood cultures -Follow-up ABI results once obtained -Continue IV antibiotics with vancomycin, Zosyn, clindamycin -Subcutaneous heparin every 8 hours for DVT prophylaxis -Consulted registered dietitian for nutrition assessment -Lantus 10 units daily and moderate sliding scale insulin  Chronic systolic heart failure Hypertension Echo today showing improvement of LVEF to 50-55%, but with regional wall motion abnormalities and inferior basal hypokinesis. Small pericardial effusion is present. LV diastolic parameters are normal.  Home Entresto, torsemide, bisoprolol.  Blood pressure is well controlled at this time.  Acute kidney injury Unsure what current baseline creatinine is.  Creatinine on admission 2.03, today 1.91.  Unsure if this is an acute kidney injury versus chronic kidney disease that developed over the past 3 years.  AKI likely prerenal secondary to dehydration from insensate losses from left foot necrotic skin infection. -Follow-up repeat bladder scan this afternoon, if elevated will need to place Foley catheter -Follow-up urinalysis -Follow-up renal ultrasound  Prior to Admission Living Arrangement: [name] assisted living facility Anticipated Discharge Location: To be decided Barriers to Discharge: Continued medical management Dispo: to be decided  Virl Axe, MD 06/23/2020, 8:38 AM Pager: [phone] After 5pm on weekdays and 1pm on weekends: On Call pager [phone]

## 2020-06-24 ENCOUNTER — Inpatient Hospital Stay (HOSPITAL_COMMUNITY): Payer: Medicaid Other | Admitting: Certified Registered Nurse Anesthetist

## 2020-06-24 ENCOUNTER — Inpatient Hospital Stay (HOSPITAL_COMMUNITY): Payer: Medicaid Other

## 2020-06-24 ENCOUNTER — Encounter (HOSPITAL_COMMUNITY): Payer: Self-pay | Admitting: Internal Medicine

## 2020-06-24 ENCOUNTER — Encounter (HOSPITAL_COMMUNITY)
Admission: EM | Disposition: A | Discharge status: Skilled Nursing Facility | Payer: Self-pay | Source: Home / Self Care | Attending: Internal Medicine

## 2020-06-24 DIAGNOSIS — M726 Necrotizing fasciitis: Secondary | ICD-10-CM

## 2020-06-24 DIAGNOSIS — E1152 Type 2 diabetes mellitus with diabetic peripheral angiopathy with gangrene: Secondary | ICD-10-CM | POA: Diagnosis not present

## 2020-06-24 DIAGNOSIS — E440 Moderate protein-calorie malnutrition: Secondary | ICD-10-CM | POA: Insufficient documentation

## 2020-06-24 DIAGNOSIS — I110 Hypertensive heart disease with heart failure: Secondary | ICD-10-CM | POA: Diagnosis not present

## 2020-06-24 DIAGNOSIS — E1142 Type 2 diabetes mellitus with diabetic polyneuropathy: Secondary | ICD-10-CM | POA: Diagnosis not present

## 2020-06-24 DIAGNOSIS — L0889 Other specified local infections of the skin and subcutaneous tissue: Secondary | ICD-10-CM | POA: Diagnosis not present

## 2020-06-24 HISTORY — PX: AMPUTATION: SHX166

## 2020-06-24 LAB — TYPE AND SCREEN
ABO/RH(D): O POS
Antibody Screen: NEGATIVE

## 2020-06-24 LAB — COMPREHENSIVE METABOLIC PANEL
ALT: 18 U/L (ref 0–44)
AST: 18 U/L (ref 15–41)
Albumin: 1.9 g/dL — ABNORMAL LOW (ref 3.5–5.0)
Alkaline Phosphatase: 75 U/L (ref 38–126)
Anion gap: 12 (ref 5–15)
BUN: 76 mg/dL — ABNORMAL HIGH (ref 8–23)
CO2: 24 mmol/L (ref 22–32)
Calcium: 8.4 mg/dL — ABNORMAL LOW (ref 8.9–10.3)
Chloride: 97 mmol/L — ABNORMAL LOW (ref 98–111)
Creatinine, Ser: 1.91 mg/dL — ABNORMAL HIGH (ref 0.61–1.24)
GFR, Estimated: 39 mL/min — ABNORMAL LOW (ref >60)
Glucose, Bld: 244 mg/dL — ABNORMAL HIGH (ref 70–99)
Potassium: 3.4 mmol/L — ABNORMAL LOW (ref 3.5–5.1)
Sodium: 133 mmol/L — ABNORMAL LOW (ref 135–145)
Total Bilirubin: 1.3 mg/dL — ABNORMAL HIGH (ref 0.3–1.2)
Total Protein: 6.5 g/dL (ref 6.5–8.1)

## 2020-06-24 LAB — CBC
HCT: 33.4 % — ABNORMAL LOW (ref 39.0–52.0)
Hemoglobin: 10.6 g/dL — ABNORMAL LOW (ref 13.0–17.0)
MCH: 27.3 pg (ref 26.0–34.0)
MCHC: 31.7 g/dL (ref 30.0–36.0)
MCV: 86.1 fL (ref 80.0–100.0)
Platelets: 495 10*3/uL — ABNORMAL HIGH (ref 150–400)
RBC: 3.88 MIL/uL — ABNORMAL LOW (ref 4.22–5.81)
RDW: 13.7 % (ref 11.5–15.5)
WBC: 25 10*3/uL — ABNORMAL HIGH (ref 4.0–10.5)
nRBC: 0 % (ref 0.0–0.2)

## 2020-06-24 LAB — SURGICAL PCR SCREEN
MRSA, PCR: NEGATIVE
Staphylococcus aureus: NEGATIVE

## 2020-06-24 LAB — GLUCOSE, CAPILLARY
Glucose-Capillary: 243 mg/dL — ABNORMAL HIGH (ref 70–99)
Glucose-Capillary: 197 mg/dL — ABNORMAL HIGH (ref 70–99)
Glucose-Capillary: 200 mg/dL — ABNORMAL HIGH (ref 70–99)
Glucose-Capillary: 259 mg/dL — ABNORMAL HIGH (ref 70–99)

## 2020-06-24 LAB — ABO/RH: ABO/RH(D): O POS

## 2020-06-24 SURGERY — AMPUTATION BELOW KNEE
Anesthesia: Monitor Anesthesia Care | Site: Knee | Laterality: Left

## 2020-06-24 MED ORDER — PROPOFOL 500 MG/50ML IV EMUL
INTRAVENOUS | Status: DC | PRN
  Administered 2020-06-24: 150 ug/kg/min via INTRAVENOUS

## 2020-06-24 MED ORDER — ONDANSETRON HCL 4 MG/2ML IJ SOLN: 4.0000 mg | Freq: Four times a day (QID) | INTRAMUSCULAR | Status: DC | PRN

## 2020-06-24 MED ORDER — ASCORBIC ACID 500 MG PO TABS
1000.0000 mg | ORAL_TABLET | Freq: Every day | ORAL | Status: DC
  Administered 2020-06-24 – 2020-06-27 (×3): 1000 mg via ORAL
  Filled 2020-06-24 (×4): qty 2

## 2020-06-24 MED ORDER — FENTANYL CITRATE (PF) 100 MCG/2ML IJ SOLN: 25.0000 ug | INTRAMUSCULAR | Status: DC | PRN

## 2020-06-24 MED ORDER — OXYCODONE HCL 5 MG PO TABS
5.0000 mg | ORAL_TABLET | ORAL | Status: DC | PRN
  Administered 2020-06-24: 10 mg via ORAL
  Filled 2020-06-24: qty 2

## 2020-06-24 MED ORDER — GUAIFENESIN-DM 100-10 MG/5ML PO SYRP: 15.0000 mL | ORAL_SOLUTION | ORAL | Status: DC | PRN

## 2020-06-24 MED ORDER — OXYCODONE HCL 5 MG/5ML PO SOLN: 5.0000 mg | Freq: Once | ORAL | Status: DC | PRN

## 2020-06-24 MED ORDER — FENTANYL CITRATE (PF) 250 MCG/5ML IJ SOLN
INTRAMUSCULAR | Status: AC
  Filled 2020-06-24: qty 5

## 2020-06-24 MED ORDER — CHLORHEXIDINE GLUCONATE 0.12 % MT SOLN
15.0000 mL | OROMUCOSAL | Status: AC
  Filled 2020-06-24 (×2): qty 15

## 2020-06-24 MED ORDER — MIDAZOLAM HCL 2 MG/2ML IJ SOLN
INTRAMUSCULAR | Status: AC
  Administered 2020-06-24: 2 mg via INTRAVENOUS
  Filled 2020-06-24: qty 2

## 2020-06-24 MED ORDER — DOCUSATE SODIUM 100 MG PO CAPS
100.0000 mg | ORAL_CAPSULE | Freq: Every day | ORAL | Status: DC
  Administered 2020-06-25 – 2020-06-27 (×3): 100 mg via ORAL
  Filled 2020-06-24 (×3): qty 1

## 2020-06-24 MED ORDER — HYDROMORPHONE HCL 1 MG/ML IJ SOLN: 0.5000 mg | INTRAMUSCULAR | Status: DC | PRN

## 2020-06-24 MED ORDER — POTASSIUM CHLORIDE 10 MEQ/100ML IV SOLN
INTRAVENOUS | Status: AC
  Filled 2020-06-24: qty 100

## 2020-06-24 MED ORDER — BISACODYL 5 MG PO TBEC: 5.0000 mg | DELAYED_RELEASE_TABLET | Freq: Every day | ORAL | Status: DC | PRN

## 2020-06-24 MED ORDER — ENOXAPARIN SODIUM 40 MG/0.4ML ~~LOC~~ SOLN
40.0000 mg | SUBCUTANEOUS | Status: DC
  Filled 2020-06-24: qty 0.4

## 2020-06-24 MED ORDER — AMISULPRIDE (ANTIEMETIC) 5 MG/2ML IV SOLN: 10.0000 mg | Freq: Once | INTRAVENOUS | Status: DC | PRN

## 2020-06-24 MED ORDER — OXYCODONE HCL 5 MG PO TABS: 5.0000 mg | ORAL_TABLET | Freq: Once | ORAL | Status: DC | PRN

## 2020-06-24 MED ORDER — SODIUM CHLORIDE 0.9 % IV SOLN: INTRAVENOUS | Status: DC

## 2020-06-24 MED ORDER — PHENOL 1.4 % MT LIQD: 1.0000 | OROMUCOSAL | Status: DC | PRN

## 2020-06-24 MED ORDER — CHLORHEXIDINE GLUCONATE 0.12 % MT SOLN
15.0000 mL | OROMUCOSAL | Status: AC
  Administered 2020-06-24: 15 mL via OROMUCOSAL
  Filled 2020-06-24: qty 15

## 2020-06-24 MED ORDER — ONDANSETRON HCL 4 MG/2ML IJ SOLN
INTRAMUSCULAR | Status: DC | PRN
  Administered 2020-06-24: 80 mg via INTRAVENOUS

## 2020-06-24 MED ORDER — ALBUMIN HUMAN 5 % IV SOLN: INTRAVENOUS | Status: DC | PRN

## 2020-06-24 MED ORDER — 0.9 % SODIUM CHLORIDE (POUR BTL) OPTIME
TOPICAL | Status: DC | PRN
  Administered 2020-06-24: 1000 mL

## 2020-06-24 MED ORDER — ONDANSETRON HCL 4 MG/2ML IJ SOLN: 4.0000 mg | Freq: Once | INTRAMUSCULAR | Status: DC | PRN

## 2020-06-24 MED ORDER — PHENYLEPHRINE 40 MCG/ML (10ML) SYRINGE FOR IV PUSH (FOR BLOOD PRESSURE SUPPORT)
PREFILLED_SYRINGE | INTRAVENOUS | Status: DC | PRN
  Administered 2020-06-24 (×3): 80 ug via INTRAVENOUS

## 2020-06-24 MED ORDER — LACTATED RINGERS IV SOLN: INTRAVENOUS | Status: DC

## 2020-06-24 MED ORDER — PROPOFOL 10 MG/ML IV BOLUS
INTRAVENOUS | Status: DC | PRN
  Administered 2020-06-24 (×2): 20 mg via INTRAVENOUS
  Administered 2020-06-24: 30 mg via INTRAVENOUS

## 2020-06-24 MED ORDER — ZINC SULFATE 220 (50 ZN) MG PO CAPS
220.0000 mg | ORAL_CAPSULE | Freq: Every day | ORAL | Status: DC
  Administered 2020-06-24 – 2020-06-27 (×4): 220 mg via ORAL
  Filled 2020-06-24 (×4): qty 1

## 2020-06-24 MED ORDER — MAGNESIUM CITRATE PO SOLN: 1.0000 | Freq: Once | ORAL | Status: DC | PRN

## 2020-06-24 MED ORDER — POTASSIUM CHLORIDE 10 MEQ/100ML IV SOLN
10.0000 meq | INTRAVENOUS | Status: AC
  Administered 2020-06-24 (×3): 10 meq via INTRAVENOUS
  Filled 2020-06-24 (×2): qty 100

## 2020-06-24 MED ORDER — TRANEXAMIC ACID-NACL 1000-0.7 MG/100ML-% IV SOLN
1000.0000 mg | Freq: Once | INTRAVENOUS | Status: AC
  Administered 2020-06-24: 1000 mg via INTRAVENOUS
  Filled 2020-06-24: qty 100

## 2020-06-24 MED ORDER — MIDAZOLAM HCL 2 MG/2ML IJ SOLN
2.0000 mg | Freq: Once | INTRAMUSCULAR | Status: AC
  Filled 2020-06-24: qty 2

## 2020-06-24 MED ORDER — ALUM & MAG HYDROXIDE-SIMETH 200-200-20 MG/5ML PO SUSP: 15.0000 mL | ORAL | Status: DC | PRN

## 2020-06-24 MED ORDER — FENTANYL CITRATE (PF) 100 MCG/2ML IJ SOLN
INTRAMUSCULAR | Status: AC
  Administered 2020-06-24: 50 ug via INTRAVENOUS
  Filled 2020-06-24: qty 2

## 2020-06-24 MED ORDER — PROSOURCE PLUS PO LIQD
30.0000 mL | Freq: Two times a day (BID) | ORAL | Status: DC
  Administered 2020-06-24 – 2020-06-26 (×3): 30 mL via ORAL
  Filled 2020-06-24 (×4): qty 30

## 2020-06-24 MED ORDER — FENTANYL CITRATE (PF) 100 MCG/2ML IJ SOLN
50.0000 ug | Freq: Once | INTRAMUSCULAR | Status: AC
  Filled 2020-06-24: qty 1

## 2020-06-24 SURGICAL SUPPLY — 38 items
BLADE SAW RECIP 87.9 MT (BLADE) ×2 IMPLANT
BLADE SURG 21 STRL SS (BLADE) ×2 IMPLANT
BNDG COHESIVE 6X5 TAN STRL LF (GAUZE/BANDAGES/DRESSINGS) ×1 IMPLANT
CANISTER WOUND CARE 500ML ATS (WOUND CARE) ×2 IMPLANT
COVER SURGICAL LIGHT HANDLE (MISCELLANEOUS) ×2 IMPLANT
COVER WAND RF STERILE (DRAPES) IMPLANT
CUFF TOURN SGL QUICK 34 (TOURNIQUET CUFF) ×2
CUFF TRNQT CYL 34X4.125X (TOURNIQUET CUFF) ×1 IMPLANT
DRAPE DERMATAC (DRAPES) ×2 IMPLANT
DRAPE INCISE IOBAN 66X45 STRL (DRAPES) ×2 IMPLANT
DRAPE U-SHAPE 47X51 STRL (DRAPES) ×2 IMPLANT
DRESSING PREVENA PLUS CUSTOM (GAUZE/BANDAGES/DRESSINGS) ×1 IMPLANT
DRSG PREVENA PLUS CUSTOM (GAUZE/BANDAGES/DRESSINGS) ×2
DURAPREP 26ML APPLICATOR (WOUND CARE) ×2 IMPLANT
ELECT REM PT RETURN 9FT ADLT (ELECTROSURGICAL) ×2
ELECTRODE REM PT RTRN 9FT ADLT (ELECTROSURGICAL) ×1 IMPLANT
GLOVE BIOGEL PI IND STRL 9 (GLOVE) ×1 IMPLANT
GLOVE BIOGEL PI INDICATOR 9 (GLOVE) ×1
GLOVE SURG ORTHO 9.0 STRL STRW (GLOVE) ×2 IMPLANT
GOWN STRL REUS W/ TWL XL LVL3 (GOWN DISPOSABLE) ×2 IMPLANT
GOWN STRL REUS W/TWL XL LVL3 (GOWN DISPOSABLE) ×4
KIT BASIN OR (CUSTOM PROCEDURE TRAY) ×2 IMPLANT
KIT TURNOVER KIT B (KITS) ×2 IMPLANT
MANIFOLD NEPTUNE II (INSTRUMENTS) ×2 IMPLANT
NS IRRIG 1000ML POUR BTL (IV SOLUTION) ×2 IMPLANT
PACK ORTHO EXTREMITY (CUSTOM PROCEDURE TRAY) ×2 IMPLANT
PAD ARMBOARD 7.5X6 YLW CONV (MISCELLANEOUS) ×2 IMPLANT
PREVENA RESTOR ARTHOFORM 46X30 (CANNISTER) ×2 IMPLANT
SPONGE LAP 18X18 RF (DISPOSABLE) IMPLANT
STAPLER VISISTAT 35W (STAPLE) ×1 IMPLANT
STOCKINETTE IMPERVIOUS LG (DRAPES) ×2 IMPLANT
SUT ETHILON 2 0 PSLX (SUTURE) IMPLANT
SUT SILK 2 0 (SUTURE) ×2
SUT SILK 2-0 18XBRD TIE 12 (SUTURE) ×1 IMPLANT
SUT VIC AB 1 CTX 27 (SUTURE) ×4 IMPLANT
TOWEL GREEN STERILE (TOWEL DISPOSABLE) ×2 IMPLANT
TUBE CONNECTING 12X1/4 (SUCTIONS) ×2 IMPLANT
YANKAUER SUCT BULB TIP NO VENT (SUCTIONS) ×2 IMPLANT

## 2020-06-24 NOTE — Anesthesia Procedure Notes (Signed)
Anesthesia Regional Block: Adductor canal block   Pre-Anesthetic Checklist: ,, timeout performed, Correct Patient, Correct Site, Correct Laterality, Correct Procedure, Correct Position, site marked, Risks and benefits discussed,  Surgical consent,  Pre-op evaluation,  At surgeon's request and post-op pain management  Laterality: Left  Prep: chloraprep       Needles:  Injection technique: Single-shot  Needle Type: Echogenic Stimulator Needle     Needle Length: 10cm  Needle Gauge: 20     Additional Needles:   Procedures:,,,, ultrasound used (permanent image in chart),,,,  Narrative:  Start time: 06/24/2020 9:04 AM End time: 06/24/2020 9:08 AM Injection made incrementally with aspirations every 5 mL.  Performed by: Personally  Anesthesiologist: Lidia Collum, MD  Additional Notes: Standard monitors applied. Skin prepped. Good needle visualization with ultrasound. Injection made in 5cc increments with no resistance to injection. Patient tolerated the procedure well.

## 2020-06-24 NOTE — Op Note (Signed)
   Date of Surgery: 06/24/2020  INDICATIONS: [name] is a 65 y.o.-year-old male who has gangrenous necrosis of the left foot with purulent draining abscess.Marland Kitchen  PREOPERATIVE DIAGNOSIS: Gangrenous abscess left foot without foot salvage intervention options.  POSTOPERATIVE DIAGNOSIS: Same.  PROCEDURE: Transtibial amputation Application of Prevena wound VAC  SURGEON: Sharol Given, M.D.  ANESTHESIA:  general  IV FLUIDS AND URINE: See anesthesia records.  ESTIMATED BLOOD LOSS: See anesthesia records.  COMPLICATIONS: None.  DESCRIPTION OF PROCEDURE: The patient was brought to the operating room after undergoing regional anesthetic. After adequate levels of anesthesia were obtained patient's lower extremity was prepped using DuraPrep draped into a sterile field. A timeout was called. The foot was draped out of the sterile field with impervious stockinette. A transverse incision was made 11 cm distal to the tibial tubercle. This curved proximally and a large posterior flap was created. The tibia was transected 1 cm proximal to the skin incision. The fibula was transected just proximal to the tibial incision. The tibia was beveled anteriorly. A large posterior flap was created. The sciatic nerve was pulled cut and allowed to retract. The vascular bundles were suture ligated with 2-0 silk. The deep and superficial fascial layers were closed using #1 Vicryl. The skin was closed using staples and 2-0 nylon. The wound was covered with a Prevena customizable and arthroform wound VAC.  The dressing was sealed with dermatac there was a good suction fit. A prosthetic shrinker will be applied in patient's room. Patient was taken to the PACU in stable condition.   DISCHARGE PLANNING:  Antibiotic duration: 24 hours  Weightbearing: Nonweightbearing on the operative extremity  Pain medication: Opioid pathway  Dressing care/ Wound VAC: Continue wound VAC for 1 week after discharge  Discharge to: Discharge  planning based on therapy's recommendations for possible inpatient rehabilitation, outpatient rehabilitation, or discharge to home with therapy  Follow-up: In the office 1 week post operative.  Meridee Score, MD [place] 10:14 AM

## 2020-06-24 NOTE — Anesthesia Postprocedure Evaluation (Signed)
Anesthesia Post Note  Patient: [name]  Procedure(s) Performed: LEFT BELOW KNEE AMPUTATION (Left Knee)     Patient location during evaluation: PACU Anesthesia Type: MAC Level of consciousness: awake and alert Pain management: pain level controlled Vital Signs Assessment: post-procedure vital signs reviewed and stable Respiratory status: spontaneous breathing, nonlabored ventilation and respiratory function stable Cardiovascular status: blood pressure returned to baseline and stable Postop Assessment: no apparent nausea or vomiting Anesthetic complications: no   No complications documented.  Last Vitals:  Vitals:   06/24/20 1020 06/24/20 1035  BP: 110/75 109/68  Pulse: 72 68  Resp: 15 13  Temp:  36.4 C  SpO2: 95% 96%    Last Pain:  Vitals:   06/24/20 1035  TempSrc:   PainSc: 0-No pain    LLE Motor Response: Purposeful movement (06/24/20 1035)            Lidia Collum

## 2020-06-24 NOTE — Progress Notes (Signed)
Inpatient Rehab Admissions Coordinator:  Consult received.  Awaiting therapy evaluations and recommendations in order to determine if pt appropriate for potential CIR admission.      Gayland Curry, [place], [place] Admissions Coordinator [phone]

## 2020-06-24 NOTE — Transfer of Care (Signed)
Immediate Anesthesia Transfer of Care Note  Patient: [name]  Procedure(s) Performed: LEFT BELOW KNEE AMPUTATION (Left Knee)  Patient Location: PACU  Anesthesia Type:MAC combined with regional for post-op pain  Level of Consciousness: drowsy, patient cooperative and responds to stimulation  Airway & Oxygen Therapy: Patient Spontanous Breathing and Patient connected to nasal cannula oxygen  Post-op Assessment: Report given to RN and Post -op Vital signs reviewed and stable  Post vital signs: Reviewed and stable  Last Vitals:  Vitals Value Taken Time  BP 90/62 06/24/20 1004  Temp    Pulse 73 06/24/20 1004  Resp 17 06/24/20 1004  SpO2 94 % 06/24/20 1004  Vitals shown include unvalidated device data.  Last Pain:  Vitals:   06/24/20 0514  TempSrc: Oral  PainSc:          Complications: No complications documented.

## 2020-06-24 NOTE — Anesthesia Preprocedure Evaluation (Addendum)
Anesthesia Evaluation  Patient identified by MRN, date of birth, ID band Patient awake    Reviewed: Allergy & Precautions, NPO status , Patient's Chart, lab work & pertinent test results  History of Anesthesia Complications Negative for: history of anesthetic complications  Airway Mallampati: II  TM Distance: >3 FB Neck ROM: Full    Dental   Pulmonary neg pulmonary ROS, former smoker,    Pulmonary exam normal        Cardiovascular hypertension, Pt. on medications and Pt. on home beta blockers + Peripheral Vascular Disease and +CHF  Normal cardiovascular exam     Neuro/Psych Seizures -,  Schizophrenia CVA    GI/Hepatic negative GI ROS, Neg liver ROS,   Endo/Other  diabetes, Type 2, Oral Hypoglycemic Agents  Renal/GU Renal disease (CKD)  negative genitourinary   Musculoskeletal negative musculoskeletal ROS (+)   Abdominal   Peds  Hematology negative hematology ROS (+)   Anesthesia Other Findings  Echo 06/23/20: EF 50-55%, mild LVH, normal RV function, valves unremarkable  Reproductive/Obstetrics                            Anesthesia Physical Anesthesia Plan  ASA: III  Anesthesia Plan: MAC   Post-op Pain Management:  Regional for Post-op pain   Induction: Intravenous  PONV Risk Score and Plan: 1 and Propofol infusion, TIVA and Treatment may vary due to age or medical condition  Airway Management Planned: Natural Airway, Nasal Cannula and Simple Face Mask  Additional Equipment: None  Intra-op Plan:   Post-operative Plan:   Informed Consent: I have reviewed the patients History and Physical, chart, labs and discussed the procedure including the risks, benefits and alternatives for the proposed anesthesia with the patient or authorized representative who has indicated his/her understanding and acceptance.       Plan Discussed with:   Anesthesia Plan Comments:          Anesthesia Quick Evaluation

## 2020-06-24 NOTE — Interval H&P Note (Signed)
History and Physical Interval Note:  06/24/2020 6:52 AM  [name]  has presented today for surgery, with the diagnosis of gangrene left foot.  The various methods of treatment have been discussed with the patient and family. After consideration of risks, benefits and other options for treatment, the patient has consented to  Procedure(s): LEFT BELOW KNEE AMPUTATION (Left) as a surgical intervention.  The patient's history has been reviewed, patient examined, no change in status, stable for surgery.  I have reviewed the patient's chart and labs.  Questions were answered to the patient's satisfaction.     Newt Minion

## 2020-06-24 NOTE — Progress Notes (Signed)
Orthopedic Tech Progress Note Patient Details:  [name] 10-08-1955 [id number] Patient has brace Patient ID: [name], male   DOB: Nov 12, 1955, 64 y.o.   MRN: [id number]   [name] 06/24/2020, 1:28 PM

## 2020-06-24 NOTE — Anesthesia Procedure Notes (Signed)
Procedure Name: MAC Date/Time: 06/24/2020 9:23 AM Performed by: Janace Litten, CRNA Pre-anesthesia Checklist: Patient identified, Emergency Drugs available, Suction available and Patient being monitored Patient Re-evaluated:Patient Re-evaluated prior to induction Oxygen Delivery Method: Nasal cannula

## 2020-06-24 NOTE — Progress Notes (Signed)
   Subjective:   [name] underwent left BKA this morning with Dr. Sharol Given.  Is feeling good today.  Denies any pain after surgery.  Discussed likely SNF placement following discharge for intense rehabilitation and a 24-hour care.  He agrees with this.  Objective:  Vital signs in last 24 hours: Vitals:   06/23/20 0634 06/23/20 1426 06/23/20 2056 06/24/20 0514  BP: 128/85 119/77 131/82 137/84  Pulse: (!) 106 (!) 107 (!) 104 100  Resp: 17 18 17 17   Temp: 98.7 F (37.1 C) 98.1 F (36.7 C) 98.3 F (36.8 C) (!) 97.3 F (36.3 C)  TempSrc: Oral Oral Oral Oral  SpO2: 97% 94% 97% 96%  Weight:  89.2 kg    Height:       General: Elderly male lying in bed, no acute distress. Cardiovascular: Normal rate and regular rhythm, no murmurs rubs or gallops. Pulmonary: Respiratory effort is normal, clear to auscultation bilaterally. MSK: Bilateral BKA's, left lower extremity with wound VAC in place. Neuro: AAOx3, no focal deficits.  Assessment/Plan:  Active Problems:   Necrotizing fasciitis of lower leg (HCC)   Type 2 diabetes mellitus with diabetic peripheral angiopathy and gangrene, without long-term current use of insulin [facility])  [name] is a 65 year old gentleman with history of hypertension, chronic combined systolic and diastolic heart failure, type 2 diabetes mellitus with neuropathy and right BKA, peripheral arterial disease presenting with left foot necrotizing skin infection now status post left transtibial amputation.  Necrotizing skin infection of the left foot status post left transtibial amputation Type 2 diabetes mellitus with neuropathy, complicated by right BKA Peripheral arterial disease Underwent successful left transtibial amputation with Dr. Sharol Given today.  Per orthopedics, he will be nonweightbearing on operative extremity. [name] is an LTACH facility, so patient may be able to back there if they have a SNF section once he is medically stable for discharge. -Continue  wound VAC for 1 week after discharge -Follow-up with orthopedics 1 week postop -PT/OT eval -We will stop antibiotics given at source control was achieved -Restart DVT prophylaxis with Lovenox on Monday -Registered dietitian following for nutrition, greatly appreciate assistance -Lantus 10 units daily and moderate SSI -Consulted TOC for SNF placement assistance, greatly appreciate assistance  Chronic systolic heart failure Hypertension Echo on 4/21 showing improvement of LVEF to 50-55%, but with regional wall motion abnormalities and inferior basal hypokinesis. Small pericardial effusion is present. LV diastolic parameters are normal.  Holding home Entresto, torsemide, bisoprolol.  Blood pressure remains well controlled.  Acute kidney injury Unsure what current baseline creatinine is.  Creatinine on admission 2.03, today 1.91.  Unsure if this is an acute kidney injury versus chronic kidney disease that developed over the past 3 years.   -Repeat bladder scan yesterday with 78 mL so we will hold off on placing Foley at this time -Urinalysis negative -Follow-up renal ultrasound  Prior to Admission Living Arrangement: [name] Anticipated Discharge Location: SNF, possibly back to [name] Barriers to Discharge: postop management Dispo: to be decided  Jake Axe, MD 06/24/2020, 8:03 AM Pager: [phone] After 5pm on weekdays and 1pm on weekends: On Call pager [phone]

## 2020-06-24 NOTE — Anesthesia Procedure Notes (Signed)
Anesthesia Regional Block: Popliteal block   Pre-Anesthetic Checklist: ,, timeout performed, Correct Patient, Correct Site, Correct Laterality, Correct Procedure, Correct Position, site marked, Risks and benefits discussed,  Surgical consent,  Pre-op evaluation,  At surgeon's request and post-op pain management  Laterality: Left  Prep: chloraprep       Needles:  Injection technique: Single-shot  Needle Type: Echogenic Stimulator Needle     Needle Length: 10cm  Needle Gauge: 20     Additional Needles:   Procedures:,,,, ultrasound used (permanent image in chart),,,,  Narrative:  Start time: 06/24/2020 9:08 AM End time: 06/24/2020 9:10 AM  Performed by: Personally  Anesthesiologist: Lidia Collum, MD  Additional Notes: Standard monitors applied. Skin prepped. Good needle visualization with ultrasound. Injection made in 5cc increments with no resistance to injection. Patient tolerated the procedure well.

## 2020-06-25 ENCOUNTER — Encounter (HOSPITAL_COMMUNITY): Payer: Self-pay | Admitting: Orthopedic Surgery

## 2020-06-25 DIAGNOSIS — I5022 Chronic systolic (congestive) heart failure: Secondary | ICD-10-CM | POA: Diagnosis not present

## 2020-06-25 DIAGNOSIS — M726 Necrotizing fasciitis: Secondary | ICD-10-CM | POA: Diagnosis not present

## 2020-06-25 DIAGNOSIS — I110 Hypertensive heart disease with heart failure: Secondary | ICD-10-CM | POA: Diagnosis not present

## 2020-06-25 DIAGNOSIS — L0889 Other specified local infections of the skin and subcutaneous tissue: Secondary | ICD-10-CM | POA: Diagnosis not present

## 2020-06-25 DIAGNOSIS — Z89511 Acquired absence of right leg below knee: Secondary | ICD-10-CM

## 2020-06-25 LAB — GLUCOSE, CAPILLARY
Glucose-Capillary: 216 mg/dL — ABNORMAL HIGH (ref 70–99)
Glucose-Capillary: 167 mg/dL — ABNORMAL HIGH (ref 70–99)
Glucose-Capillary: 263 mg/dL — ABNORMAL HIGH (ref 70–99)
Glucose-Capillary: 191 mg/dL — ABNORMAL HIGH (ref 70–99)

## 2020-06-25 LAB — BASIC METABOLIC PANEL
Anion gap: 9 (ref 5–15)
BUN: 69 mg/dL — ABNORMAL HIGH (ref 8–23)
CO2: 27 mmol/L (ref 22–32)
Calcium: 8.3 mg/dL — ABNORMAL LOW (ref 8.9–10.3)
Chloride: 99 mmol/L (ref 98–111)
Creatinine, Ser: 1.74 mg/dL — ABNORMAL HIGH (ref 0.61–1.24)
GFR, Estimated: 44 mL/min — ABNORMAL LOW (ref >60)
Glucose, Bld: 245 mg/dL — ABNORMAL HIGH (ref 70–99)
Potassium: 3.6 mmol/L (ref 3.5–5.1)
Sodium: 135 mmol/L (ref 135–145)

## 2020-06-25 LAB — CBC
HCT: 31.5 % — ABNORMAL LOW (ref 39.0–52.0)
Hemoglobin: 10 g/dL — ABNORMAL LOW (ref 13.0–17.0)
MCH: 27.2 pg (ref 26.0–34.0)
MCHC: 31.7 g/dL (ref 30.0–36.0)
MCV: 85.6 fL (ref 80.0–100.0)
Platelets: 492 10*3/uL — ABNORMAL HIGH (ref 150–400)
RBC: 3.68 MIL/uL — ABNORMAL LOW (ref 4.22–5.81)
RDW: 13.5 % (ref 11.5–15.5)
WBC: 16.5 10*3/uL — ABNORMAL HIGH (ref 4.0–10.5)
nRBC: 0 % (ref 0.0–0.2)

## 2020-06-25 NOTE — Progress Notes (Signed)
Pt  Refused his blood sugar checked. Pt had been rude. Educated pt.

## 2020-06-25 NOTE — Progress Notes (Signed)
Patient ID: [name], male   DOB: 01/06/1956, 65 y.o.   MRN: [id number] Patient is postoperative day 1 left transtibial amputation.  Patient states he feels better this morning.  The wound VAC is functioning well there is no drainage.  Anticipate discharge to skilled nursing.  We will continue the wound VAC dressing for 1 week.

## 2020-06-25 NOTE — Evaluation (Signed)
Physical Therapy Evaluation Patient Details Name: [name] MRN: [id number] DOB: 1955/12/27 Today's Date: 06/25/2020   History of Present Illness  Pt is a 65 y/o male who presents s/p transtibial amputation with application of Prevena wound VAC on 06/24/2020. PMH significant for RLE BKA and revision (prothesis in room), blindness (left eye greater than right), PAD, HTN, seizures, diabetic neuropathy, cardiomyopathy.    Clinical Impression  Pt admitted with above diagnosis. Pt currently with functional limitations due to the deficits listed below (see PT Problem List). At the time of PT eval pt was able to perform transfers with up to +2 max assist and RW for support. Pt with prosthesis in room and was able to don with assist. PTA pt reports he was ambulating short distances with prosthesis and RW. Tolerance for functional activity is low at this time, and pt will benefit from continued therapy at the SNF level to maximize functional independence and safety. Pt will benefit from skilled PT to increase their independence and safety with mobility to allow discharge to the venue listed below.       Follow Up Recommendations SNF;Supervision/Assistance - 24 hour    Equipment Recommendations  Wheelchair (measurements PT);Wheelchair cushion (measurements PT)    Recommendations for Other Services       Precautions / Restrictions Precautions Precautions: Fall Precaution Comments: Very low vision Required Braces or Orthoses: Other Brace Other Brace: Residual limb protector Restrictions Weight Bearing Restrictions: Yes LLE Weight Bearing: Non weight bearing      Mobility  Bed Mobility Overal bed mobility: Needs Assistance Bed Mobility: Supine to Sit;Sit to Sidelying;Rolling Rolling: Supervision   Supine to sit: Mod assist;+2 for physical assistance;HOB elevated   Sit to sidelying: Mod assist;+2 for physical assistance General bed mobility comments: VCs due to vision loss. Initially,  assist for trunk stability as he gained his sitting balance. Assist for LE elevation back up into bed and to straighten out as he was laying diagonally in the bed upon initial return to supine. He was able to use the headboard to pull himself up in the bed. Rolled well R and L.    Transfers Overall transfer level: Needs assistance Equipment used: Rolling walker (2 wheeled) Transfers: Sit to/from Stand Sit to Stand: Max assist;+2 physical assistance;From elevated surface         General transfer comment: RLE prothesis donned. He was able to achieve ~75% of full upright with ~5-10 seconds of standing endurance.  Ambulation/Gait             General Gait Details: Unable to progress to gait training this session.  Stairs            Wheelchair Mobility    Modified Rankin (Stroke Patients Only)       Balance Overall balance assessment: Needs assistance Sitting-balance support: No upper extremity supported;Feet unsupported (Bil BKA) Sitting balance-Leahy Scale: Poor Sitting balance - Comments: poor approaching fair. Was able to demonstrate reaching hands up overhead in sitting as well as x3 reaches forward towards therapist. Postural control: Posterior lean Standing balance support: Bilateral upper extremity supported Standing balance-Leahy Scale: Zero Standing balance comment: +2 assist required.                             Pertinent Vitals/Pain Pain Assessment: Faces Faces Pain Scale: Hurts a little bit Pain Location: LLE - pt appears uncomfortable at times and is guarding the residual limb Pain Descriptors / Indicators: Operative  site guarding    Home Living Family/patient expects to be discharged to:: Skilled nursing facility                      Prior Function           Comments: Pt reports he was ambulating with his prothesis and RW PTA, that he could dress himself     Hand Dominance   Dominant Hand: Right    Extremity/Trunk  Assessment   Upper Extremity Assessment Upper Extremity Assessment: Overall WFL for tasks assessed (Pt was able to off weight his hips to scoot around, and pulled himself up in the bed by reaching back for the headboard.)    Lower Extremity Assessment Lower Extremity Assessment: RLE deficits/detail;LLE deficits/detail RLE Deficits / Details: Prior BKA with prosthetic in room. Double sleeve plus the prosthetic. LLE Deficits / Details: Residual limb protector donned.    Cervical / Trunk Assessment Cervical / Trunk Assessment: Normal  Communication   Communication: No difficulties  Cognition Arousal/Alertness: Awake/alert Behavior During Therapy: WFL for tasks assessed/performed Overall Cognitive Status: No family/caregiver present to determine baseline cognitive functioning                                 General Comments: Pt was able to take one of his prothestic sleeves and orient it to correct position by feeling with his hands, but he had a hard time following some of the commands we asked of him (scoot foreward and he would scoot backwards)      General Comments      Exercises     Assessment/Plan    PT Assessment Patient needs continued PT services  PT Problem List Decreased strength;Decreased activity tolerance;Decreased balance;Decreased mobility;Decreased knowledge of use of DME;Decreased safety awareness;Decreased knowledge of precautions;Pain       PT Treatment Interventions DME instruction;Gait training;Functional mobility training;Therapeutic exercise;Therapeutic activities;Neuromuscular re-education;Patient/family education;Wheelchair mobility training    PT Goals (Current goals can be found in the Care Plan section)  Acute Rehab PT Goals Patient Stated Goal: None stated. PT Goal Formulation: With patient Time For Goal Achievement: 07/09/20 Potential to Achieve Goals: Good    Frequency Min 2X/week   Barriers to discharge         Co-evaluation PT/OT/SLP Co-Evaluation/Treatment: Yes Reason for Co-Treatment: Complexity of the patient's impairments (multi-system involvement);For patient/therapist safety;To address functional/ADL transfers PT goals addressed during session: Mobility/safety with mobility;Proper use of DME;Balance         AM-PAC PT "6 Clicks" Mobility  Outcome Measure Help needed turning from your back to your side while in a flat bed without using bedrails?: None Help needed moving from lying on your back to sitting on the side of a flat bed without using bedrails?: A Lot Help needed moving to and from a bed to a chair (including a wheelchair)?: Total Help needed standing up from a chair using your arms (e.g., wheelchair or bedside chair)?: Total Help needed to walk in hospital room?: Total Help needed climbing 3-5 steps with a railing? : Total 6 Click Score: 10    End of Session Equipment Utilized During Treatment: Gait belt;Other (comment) (Residual limb protector) Activity Tolerance: Patient tolerated treatment well Patient left: in bed;with call bell/phone within reach;with bed alarm set Nurse Communication: Mobility status PT Visit Diagnosis: Unsteadiness on feet (R26.81);Pain;Muscle weakness (generalized) (M62.81);Difficulty in walking, not elsewhere classified (R26.2) Pain - Right/Left: Left Pain - part of  body: Leg    Time: 1610-9604 PT Time Calculation (min) (ACUTE ONLY): 27 min   Charges:   PT Evaluation $PT Eval High Complexity: 1 High          Rolinda Roan, PT, DPT Acute Rehabilitation Services Pager: [phone] Office: [phone]   Thelma Comp 06/25/2020, 2:38 PM

## 2020-06-25 NOTE — Evaluation (Addendum)
Occupational Therapy Evaluation Patient Details Name: [name] MRN: [id number] DOB: [birth year] Today's Date: 06/25/2020    History of Present Illness [name] is a 65 y.o.-year-old male who has gangrenous necrosis of the left foot with purulent draining abscess s/p transtibial amputation  Application of Prevena wound VAC 4/22. PHMx: RLE BKA (prothesis in room), blindness (left eye greater than right)   Clinical Impression   This 65 yo male admitted and underwent above presents to acute OT with PLOF of needing A with ambulation and ADLs while residing at [facility]. He presently still needs A for mobility and ADLs due to now with L BKA (in addition to his previous R BKA), due to increased effect on his sitting and standing balance. He will continue to benefit from acute OT with follow up at SNF.    Follow Up Recommendations  SNF;Supervision/Assistance - 24 hour    Equipment Recommendations  Other (comment) (TBD next venue)       Precautions / Restrictions Precautions Precautions: Fall Restrictions Weight Bearing Restrictions: No      Mobility Bed Mobility Overal bed mobility: Needs Assistance Bed Mobility: Supine to Sit;Sit to Sidelying     Supine to sit: Mod assist;+2 for physical assistance;HOB elevated   Sit to sidelying: Mod assist;+2 for physical assistance General bed mobility comments: VCs due to vision loss    Transfers Overall transfer level: Needs assistance Equipment used: Rolling walker (2 wheeled) Transfers: Sit to/from Stand Sit to Stand: Max assist;+2 physical assistance;From elevated surface         General transfer comment: RLE prothesis, ~75% of full upright with only able to tolerate ~5-10 seconds    Balance Overall balance assessment: Needs assistance Sitting-balance support: No upper extremity supported;Feet unsupported (Bil BKA)   Sitting balance - Comments: varied from good-poor   Standing balance support: Bilateral upper extremity  supported Standing balance-Leahy Scale: Zero                             ADL either performed or assessed with clinical judgement   ADL Overall ADL's : Needs assistance/impaired Eating/Feeding: Set up;Supervision/ safety;Bed level   Grooming: Supervision/safety;Minimal assistance;Sitting Grooming Details (indicate cue type and reason): EOB Upper Body Bathing: Minimal assistance;Sitting Upper Body Bathing Details (indicate cue type and reason): EOB Lower Body Bathing: Total assistance;Bed level   Upper Body Dressing : Minimal assistance;Sitting Upper Body Dressing Details (indicate cue type and reason): EOB Lower Body Dressing: Total assistance;Bed level                       Vision Baseline Vision/History: Legally blind Additional Comments: Pt unable to tell us why he can't see; however his left eye is worse than right one            Pertinent Vitals/Pain Pain Assessment: No/denies pain     Hand Dominance Right   Extremity/Trunk Assessment Upper Extremity Assessment Upper Extremity Assessment: Generalized weakness              Cognition Arousal/Alertness: Awake/alert Behavior During Therapy: WFL for tasks assessed/performed Overall Cognitive Status: No family/caregiver present to determine baseline cognitive functioning                                 General Comments: Pt was able to take one of his prothestic sleeves and orient it to correct position by feeling  with his hands, but he had a hard time following some of the commands we asked of him (scoot foreward and he would scoot backwards)              Home Living Family/patient expects to be discharged to:: Skilled nursing facility                                        Prior Functioning/Environment          Comments: Pt reports he was ambulating with his prothesis and RW pta, that he could dress himself        OT Problem List: Decreased  strength;Impaired balance (sitting and/or standing);Impaired vision/perception;Decreased cognition;Decreased safety awareness      OT Treatment/Interventions: Self-care/ADL training;DME and/or AE instruction;Patient/family education;Balance training    OT Goals(Current goals can be found in the care plan section) Acute Rehab OT Goals Patient Stated Goal: agreeable to sit up on EOB and try to stand OT Goal Formulation: With patient Time For Goal Achievement: 07/09/20 Potential to Achieve Goals: Good  OT Frequency: Min 2X/week           Co-evaluation PT/OT/SLP Co-Evaluation/Treatment: Yes Reason for Co-Treatment: For patient/therapist safety;Necessary to address cognition/behavior during functional activity          AM-PAC OT "6 Clicks" Daily Activity     Outcome Measure Help from another person eating meals?: A Little Help from another person taking care of personal grooming?: A Little Help from another person toileting, which includes using toliet, bedpan, or urinal?: Total Help from another person bathing (including washing, rinsing, drying)?: A Lot Help from another person to put on and taking off regular upper body clothing?: A Little Help from another person to put on and taking off regular lower body clothing?: Total 6 Click Score: 13   End of Session Equipment Utilized During Treatment: Gait belt;Rolling walker Nurse Communication: Mobility status (with NT)  Activity Tolerance: Patient tolerated treatment well Patient left: in bed;with call bell/phone within reach;with bed alarm set  OT Visit Diagnosis: Other abnormalities of gait and mobility (R26.89);Muscle weakness (generalized) (M62.81);Low vision, both eyes (H54.2);Other symptoms and signs involving cognitive function                Time: 6144-3154 OT Time Calculation (min): 27 min Charges:  OT General Charges $OT Visit: 1 Visit OT Evaluation $OT Eval Moderate Complexity: 1 Mod  [place]en Circle, OTR/L Acute  [employer] Pager [phone] Office [phone]     Almon Register 06/25/2020, 1:24 PM

## 2020-06-25 NOTE — Progress Notes (Signed)
   Subjective:   No overnight events.   This AM, [name] denies any acute complaints including pain in his legs.  He denies any symptoms of hypervolemia, including shortness of breath.   Objective:  Vital signs in last 24 hours: Vitals:   06/24/20 1503 06/24/20 1802 06/25/20 0500 06/25/20 0537  BP: 123/83 116/78  (!) 141/93  Pulse: 100 69  70  Resp: 16 16  18   Temp: 97.8 F (36.6 C) 97.8 F (36.6 C)  98.7 F (37.1 C)  TempSrc: Oral Oral  Oral  SpO2: 96% 96%  95%  Weight:   90.5 kg   Height:       General: Elderly male lying in bed, no acute distress. Cardiovascular: Normal rate and regular rhythm, no murmurs rubs or gallops. Pulmonary: Respiratory effort is normal, clear to auscultation bilaterally. MSK: Bilateral BKA's, left lower extremity with wound VAC in place. No pitting edema of the thighs. Neuro: AAOx3, no focal deficits.  Assessment/Plan:  Active Problems:   Necrotizing fasciitis of lower leg (HCC)   Type 2 diabetes mellitus with diabetic peripheral angiopathy and gangrene, without long-term current use of insulin (HCC)   Malnutrition of moderate degree  [name] is a 65 year old gentleman with history of hypertension, chronic combined systolic and diastolic heart failure, type 2 diabetes mellitus with neuropathy and right BKA, peripheral arterial disease presenting with left foot necrotizing skin infection now status post left transtibial amputation.  Necrotizing skin infection of the left foot status post left transtibial amputation Type 2 diabetes mellitus with neuropathy, complicated by right BKA Peripheral arterial disease Underwent successful left transtibial amputation with Dr. Sharol Tucker today.  Per orthopedics, he will be nonweightbearing on operative extremity. [name] is an LTACH facility, so patient may be able to back there if they have a SNF section once he is medically stable for discharge. -Continue wound VAC for 1 week after  discharge -Follow-up with orthopedics 1 week postop -PT/OT eval -Restart DVT prophylaxis with Lovenox on Monday -Registered dietitian following for nutrition, greatly appreciate assistance -Lantus 10 units daily and moderate SSI -Consulted TOC for SNF placement assistance, greatly appreciate assistance  Chronic systolic heart failure Hypertension Echo on 4/21 showing improvement of LVEF to 50-55%, but with regional wall motion abnormalities and inferior basal hypokinesis. Small pericardial effusion is present. LV diastolic parameters are normal.  Blood pressure remains well controlled, so we will continue holding Entresto.  Patient does not appear hypervolemic on examination, however low threshold to restart torsemide.  It looks like his bisoprolol is only as needed at his LTAC and there is no indication for currently.  -Holding home Entresto, torsemide, bisoprolol.  Acute kidney injury Unsure what current baseline creatinine is.  Creatinine on admission 2.03, today 1.91.  Unsure if this is an acute kidney injury versus chronic kidney disease that developed over the past 3 years.  Renal ultrasound remarkable.  UA is negative. -Trend BMP while admitted.  Creatinine improving today  Prior to Admission Living Arrangement: [name] Anticipated Discharge Location: SNF, possibly back to [name] Barriers to Discharge: postop management Dispo: to be decided  Dr. Jose Tucker Internal Medicine PGY-2  Pager: [phone] After 5pm on weekdays and 1pm on weekends: On Call pager [phone]  06/25/2020, 7:15 AM

## 2020-06-25 NOTE — Plan of Care (Signed)
  Problem: Education: Goal: Knowledge of General Education information will improve Description: Including pain rating scale, medication(s)/side effects and non-pharmacologic comfort measures Outcome: Progressing   Problem: Health Behavior/Discharge Planning: Goal: Ability to manage health-related needs will improve Outcome: Progressing   Problem: Clinical Measurements: Goal: Ability to maintain clinical measurements within normal limits will improve Outcome: Progressing Goal: Cardiovascular complication will be avoided Outcome: Progressing   Problem: Nutrition: Goal: Adequate nutrition will be maintained Outcome: Progressing   Problem: Coping: Goal: Level of anxiety will decrease Outcome: Progressing   Problem: Elimination: Goal: Will not experience complications related to urinary retention Outcome: Progressing   Problem: Pain Managment: Goal: General experience of comfort will improve Outcome: Progressing   Problem: Safety: Goal: Ability to remain free from injury will improve Outcome: Progressing   Problem: Skin Integrity: Goal: Risk for impaired skin integrity will decrease Outcome: Progressing

## 2020-06-26 LAB — BASIC METABOLIC PANEL
Anion gap: 6 (ref 5–15)
BUN: 48 mg/dL — ABNORMAL HIGH (ref 8–23)
CO2: 29 mmol/L (ref 22–32)
Calcium: 8.4 mg/dL — ABNORMAL LOW (ref 8.9–10.3)
Chloride: 101 mmol/L (ref 98–111)
Creatinine, Ser: 1.43 mg/dL — ABNORMAL HIGH (ref 0.61–1.24)
GFR, Estimated: 55 mL/min — ABNORMAL LOW (ref >60)
Glucose, Bld: 270 mg/dL — ABNORMAL HIGH (ref 70–99)
Potassium: 3.9 mmol/L (ref 3.5–5.1)
Sodium: 136 mmol/L (ref 135–145)

## 2020-06-26 LAB — CBC
HCT: 32.9 % — ABNORMAL LOW (ref 39.0–52.0)
Hemoglobin: 10.3 g/dL — ABNORMAL LOW (ref 13.0–17.0)
MCH: 26.9 pg (ref 26.0–34.0)
MCHC: 31.3 g/dL (ref 30.0–36.0)
MCV: 85.9 fL (ref 80.0–100.0)
Platelets: 538 10*3/uL — ABNORMAL HIGH (ref 150–400)
RBC: 3.83 MIL/uL — ABNORMAL LOW (ref 4.22–5.81)
RDW: 13.4 % (ref 11.5–15.5)
WBC: 15.9 10*3/uL — ABNORMAL HIGH (ref 4.0–10.5)
nRBC: 0 % (ref 0.0–0.2)

## 2020-06-26 LAB — GLUCOSE, CAPILLARY
Glucose-Capillary: 231 mg/dL — ABNORMAL HIGH (ref 70–99)
Glucose-Capillary: 220 mg/dL — ABNORMAL HIGH (ref 70–99)
Glucose-Capillary: 237 mg/dL — ABNORMAL HIGH (ref 70–99)
Glucose-Capillary: 271 mg/dL — ABNORMAL HIGH (ref 70–99)

## 2020-06-26 MED ORDER — INSULIN GLARGINE 100 UNIT/ML ~~LOC~~ SOLN
12.0000 [IU] | Freq: Every day | SUBCUTANEOUS | Status: DC
  Filled 2020-06-26: qty 0.12

## 2020-06-26 MED ORDER — INSULIN GLARGINE 100 UNIT/ML ~~LOC~~ SOLN
18.0000 [IU] | Freq: Every day | SUBCUTANEOUS | Status: DC
  Administered 2020-06-26: 18 [IU] via SUBCUTANEOUS
  Filled 2020-06-26 (×2): qty 0.18

## 2020-06-26 NOTE — Plan of Care (Signed)
  Problem: Education: Goal: Knowledge of General Education information will improve Description: Including pain rating scale, medication(s)/side effects and non-pharmacologic comfort measures Outcome: Progressing   Problem: Health Behavior/Discharge Planning: Goal: Ability to manage health-related needs will improve Outcome: Progressing   Problem: Clinical Measurements: Goal: Ability to maintain clinical measurements within normal limits will improve Outcome: Progressing Goal: Respiratory complications will improve Outcome: Progressing Goal: Cardiovascular complication will be avoided Outcome: Progressing   Problem: Nutrition: Goal: Adequate nutrition will be maintained Outcome: Progressing   Problem: Elimination: Goal: Will not experience complications related to bowel motility Outcome: Progressing Goal: Will not experience complications related to urinary retention Outcome: Progressing   Problem: Pain Managment: Goal: General experience of comfort will improve Outcome: Progressing   Problem: Safety: Goal: Ability to remain free from injury will improve Outcome: Progressing   Problem: Skin Integrity: Goal: Risk for impaired skin integrity will decrease Outcome: Progressing   

## 2020-06-26 NOTE — Progress Notes (Signed)
Inpatient Rehab Admissions Coordinator:  Note PT/OT recommending SNF for the rehab venue post acute care hospital admission.  Also note pt admitted from [facility], a SNF.  TOC made aware.  AC will sign off.   Gayland Curry, [place], [place] Admissions Coordinator [phone]

## 2020-06-26 NOTE — Progress Notes (Signed)
   Subjective:   No acute overnight events.  Reports feeling good today.  Discussed waiting on SNF placement, he understands.  No questions or concerns at this time.   Objective:  Vital signs in last 24 hours: Vitals:   06/25/20 0537 06/25/20 1443 06/25/20 2000 06/26/20 0405  BP: (!) 141/93 135/80 127/81 (!) 143/76  Pulse: 70 (!) 105 (!) 101 65  Resp: 18 17 18 18   Temp: 98.7 F (37.1 C) 98.7 F (37.1 C) 97.7 F (36.5 C) 98 F (36.7 C)  TempSrc: Oral Oral Oral Oral  SpO2: 95% 95% 98% 99%  Weight:      Height:       Neuro: Elderly male lying in bed, no acute distress. Cardiovascular: Normal rate and regular rhythm, no murmurs rubs or gallops. Pulmonary: Respiratory effort is normal, clear to auscultation bilaterally. MSK: left lower extremity with wound VAC in place Neuro: AAOx3, no focal deficits.  Assessment/Plan:  Active Problems:   Necrotizing fasciitis of lower leg (HCC)   Type 2 diabetes mellitus with diabetic peripheral angiopathy and gangrene, without long-term current use of insulin (HCC)   Malnutrition of moderate degree  [name] is a 65 year old gentleman with history of hypertension, chronic combined systolic and diastolic heart failure, type 2 diabetes mellitus with neuropathy and right BKA, peripheral arterial disease presenting with left foot necrotizing skin infection now status post left transtibial amputation.  Necrotizing skin infection of the left foot status post left transtibial amputation Type 2 diabetes mellitus with neuropathy, complicated by right BKA Peripheral arterial disease Postop day 2 s/p left BKA. Awaiting SNF placement per PT/OT recommendations. [name] is an [name], so patient may be able to go back there. Otherwise, patient is medically stable for discharge. -Continue wound VAC for 1 week after discharge -Follow-up with orthopedics 1 week postop -Restart DVT prophylaxis with Lovenox on Monday -Registered dietitian  following for nutrition, greatly appreciate assistance -Moderate SSI, lantus increased to 18 units for better glycemic control in postop setting -Consulted TOC for SNF placement assistance, greatly appreciate assistance  Chronic systolic heart failure Hypertension Echo on 4/21 showing improvement of LVEF to 50-55%, but with regional wall motion abnormalities and inferior basal hypokinesis. Small pericardial effusion is present. LV diastolic parameters are normal.  Blood pressure remains well controlled, so we will continue holding Entresto.  Patient does not appear hypervolemic on examination, however low threshold to restart torsemide. He is on a fairly high dose of torsemide (80mg  BID) at [facility] but he is not volume overloaded despite having held it since admission so may need to lower torsemide dose at discharge.  It looks like his bisoprolol is only as needed at his LTAC and there is no indication for currently. -Holding home Entresto, torsemide, bisoprolol  Acute kidney injury Unsure what current baseline creatinine is.  Creatinine on admission 2.03, today 1.43.  -Trend BMP while admitted.  Creatinine improved today. -Avoid nephrotoxic medications  Prior to Admission Living Arrangement: [name] Anticipated Discharge Location: SNF, possibly back to [name] Barriers to Discharge: postop management Dispo: to be decided  Dr. Virl Axe Internal Medicine PGY-1  Pager: [phone] After 5pm on weekdays and 1pm on weekends: On Call pager [phone]  06/26/2020, 6:52 AM

## 2020-06-26 NOTE — NC FL2 (Signed)
[place] LEVEL OF CARE SCREENING TOOL     IDENTIFICATION  Patient Name: [name] Birthdate: [birth year] Sex: male Admission Date (Current Location): 06/22/2020  [facility] and [place] Number:  Herbalist and Address:  [facility], [place] [address], [place],  [address]      Provider Number: [id number]  Attending Physician Name and Address:  Velna Ochs, MD  Relative Name and Phone Number:       Current Level of Care: Hospital Recommended Level of Care: [place] Prior Approval Number:    Date Approved/Denied:   PASRR Number:    Discharge Plan: SNF    Current Diagnoses: Patient Active Problem List   Diagnosis Date Noted  . Malnutrition of moderate degree 06/24/2020  . Type 2 diabetes mellitus with diabetic peripheral angiopathy and gangrene, without long-term current use of insulin ([place])   . Necrotizing fasciitis of lower leg ([place]) 06/22/2020  . Type 2 diabetes mellitus with foot ulcer and gangrene ([place])   . Abscess of left foot   . Foot infection   . Focal motor seizure disorder ([place]) 05/06/2017  . Irregular heart rate   . Acute encephalopathy 01/31/2017  . Cellulitis of right upper extremity   . Anasarca   . Altered mental status   . Slurred speech   . Hypothermia 01/30/2017  . Pressure injury of skin 12/21/2016  . CHF exacerbation ([place]) 12/20/2016  . Peripheral edema   . Benign prostatic hyperplasia with lower urinary tract symptoms   . Embolic stroke ([place]) 94/85/4627  . Cardiomyopathy, dilated ([place]) 10/19/2016  . Acute on chronic systolic heart failure, NYHA class 3 ([place]) 10/19/2016  . Moderate to severe pulmonary hypertension ([place]) 10/19/2016  . Severe tricuspid regurgitation by prior echocardiogram 10/19/2016  . Diabetes mellitus type 2 in obese ([place]) 10/19/2016  . HLD (hyperlipidemia) 10/19/2016  . Seizure ([place]) 10/19/2016  . Fluid overload 10/14/2016  . 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 Cardiorenal syndrome with renal failure 09/28/2016  . Acute urinary retention 09/26/2016  . Acute on chronic combined systolic and diastolic CHF (congestive heart failure) ([place]) 09/26/2016  . Scrotal edema 09/24/2016  . Multiple open wounds of lower leg, initial encounter 09/24/2016  . Tinea of groin 09/24/2016  . Chronic kidney disease (CKD), stage III (moderate) ([place]) 06/07/2016  . Chronic combined systolic and diastolic congestive heart failure ([place])   . Protein calorie malnutrition ([place]) 07/12/2015  . Congestive dilated cardiomyopathy ([place]burg) 07/11/2015  . Pressure ulcer 07/09/2015  . Open knee wound 07/06/2015  . History of right below knee amputation ([place]cambre) 02/22/2015  . PAD (peripheral artery disease) (King[place]) 02/22/2015  . Blindness of left eye 01/04/2015  . Complications, amputation stump late ([place]) 08/06/2014  . Diabetic neuropathy associated with type 2 diabetes mellitus ([place]) 12/01/2013  . Noncompliance with medications 07/13/2010  . OTHER SPEC TYPES SCHIZOPHRENIA UNSPEC CONDITION 05/24/2009  . Obesity, unspecified 06/09/2007  . DM (diabetes mellitus), type 2, uncontrolled ([place]) 05/02/2006  . Essential hypertension 05/02/2006    Orientation RESPIRATION BLADDER Height & Weight     Self,Time,Situation,Place  Normal Incontinent Weight: 200 lb 13.4 oz (91.1 kg) Height:  6' (182.9 cm)  BEHAVIORAL SYMPTOMS/MOOD NEUROLOGICAL BOWEL NUTRITION STATUS      Continent Diet (heart healthy/carb modified)  AMBULATORY STATUS COMMUNICATION OF NEEDS Skin   Extensive Assist Verbally Surgical wounds,Wound Vac  Personal Care Assistance Level of Assistance  Bathing,Feeding,Dressing Bathing Assistance: Maximum assistance Feeding assistance: Limited assistance Dressing Assistance: Maximum assistance     Functional Limitations Info  Sight Sight Info: Impaired (blind left  eye)        SPECIAL CARE FACTORS FREQUENCY  PT (By licensed PT),OT (By licensed OT)     PT Frequency: 5x/wk OT Frequency: 5x/wk            Contractures Contractures Info: Not present    Additional Factors Info  Code Status,Allergies,Psychotropic,Insulin Sliding Scale Code Status Info: DNR Allergies Info: NKA Psychotropic Info: Depakote 125mg  2x/day; Remeron 7.5mg  daily at bed Insulin Sliding Scale Info: see DC summary       Current Medications (06/26/2020):  This is the current hospital active medication list Current Facility-Administered Medications  Medication Dose Route Frequency Provider Last Rate Last Admin  . (feeding supplement) PROSource Plus liquid 30 mL  30 mL Oral BID BM Persons, Bevely Palmer, PA   30 mL at 06/26/20 0919  . 0.9 %  sodium chloride infusion   Intravenous Continuous Persons, Bevely Palmer, [place]   Stopped at 06/24/20 1333  . acetaminophen (TYLENOL) tablet 650 mg  650 mg Oral Q6H PRN Persons, Bevely Palmer, [place]   650 mg at 06/24/20 2053   Or  . acetaminophen (TYLENOL) suppository 650 mg  650 mg Rectal Q6H PRN Persons, Bevely Palmer, PA      . alum & mag hydroxide-simeth (MAALOX/MYLANTA) 200-200-20 MG/5ML suspension 15-30 mL  15-30 mL Oral Q2H PRN Persons, Bevely Palmer, PA      . ascorbic acid (VITAMIN C) tablet 1,000 mg  1,000 mg Oral Daily Persons, Bevely Palmer, PA   1,000 mg at 06/25/20 1024  . aspirin EC tablet 81 mg  81 mg Oral Daily Persons, Bevely Palmer, [place]   81 mg at 06/26/20 9485  . atorvastatin (LIPITOR) tablet 40 mg  40 mg Oral QHS Persons, Bevely Palmer, [place]   40 mg at 06/25/20 2135  . bisacodyl (DULCOLAX) EC tablet 5 mg  5 mg Oral Daily PRN Persons, Bevely Palmer, PA      . brimonidine (ALPHAGAN) 0.2 % ophthalmic solution 1 drop  1 drop Right Eye BID Persons, Bevely Palmer, [place]   1 drop at 06/25/20 2136  . divalproex (DEPAKOTE) DR tablet 125 mg  125 mg Oral BID Persons, Bevely Palmer, [place]   125 mg at 06/26/20 4627  . docusate sodium (COLACE) capsule 100 mg  100 mg Oral Daily Persons,  Bevely Palmer, PA   100 mg at 06/26/20 0919  . dorzolamide-timolol (COSOPT) 22.3-6.8 MG/ML ophthalmic solution 1 drop  1 drop Right Eye BID Persons, Bevely Palmer, [place]   1 drop at 06/25/20 2135  . [START ON 06/27/2020] enoxaparin (LOVENOX) injection 40 mg  40 mg Subcutaneous Q24H Jose Persia, MD      . guaiFENesin-dextromethorphan (ROBITUSSIN DM) 100-10 MG/5ML syrup 15 mL  15 mL Oral Q4H PRN Persons, Bevely Palmer, [place]      . HYDROmorphone (DILAUDID) injection 0.5 mg  0.5 mg Intravenous Q4H PRN Persons, Bevely Palmer, PA      . insulin aspart (novoLOG) injection 0-15 Units  0-15 Units Subcutaneous TID WC Persons, Bevely Palmer, [place]   5 Units at 06/26/20 [phone]  . insulin glargine (LANTUS) injection 18 Units  18 Units Subcutaneous Daily Virl Axe, MD   18 Units at 06/26/20 1001  . lactated ringers infusion   Intravenous Continuous Persons, Bevely Palmer, [place] 10 mL/hr at 06/24/20 0938 Restarted at  06/24/20 0929  . levETIRAcetam (KEPPRA) tablet 750 mg  750 mg Oral BID Persons, Bevely Palmer, PA   750 mg at 06/25/20 2135  . magnesium citrate solution 1 Bottle  1 Bottle Oral Once PRN Persons, Bevely Palmer, PA      . mirtazapine (REMERON) tablet 7.5 mg  7.5 mg Oral QHS Persons, Bevely Palmer, PA   7.5 mg at 06/25/20 2135  . multivitamin with minerals tablet 1 tablet  1 tablet Oral Daily Persons, Bevely Palmer, [place]   1 tablet at 06/25/20 1024  . nutrition supplement (JUVEN) (JUVEN) powder packet 1 packet  1 packet Oral BID BM Persons, Bevely Palmer, [place]   1 packet at 06/25/20 1024  . ondansetron (ZOFRAN) injection 4 mg  4 mg Intravenous Q6H PRN Persons, Bevely Palmer, PA      . oxyCODONE (Oxy IR/ROXICODONE) immediate release tablet 5-10 mg  5-10 mg Oral Q4H PRN Persons, Bevely Palmer, [place]   10 mg at 06/24/20 2053  . pantoprazole (PROTONIX) EC tablet 20 mg  20 mg Oral Daily Persons, Bevely Palmer, PA   20 mg at 06/25/20 1024  . phenol (CHLORASEPTIC) mouth spray 1 spray  1 spray Mouth/Throat PRN Persons, Bevely Palmer, PA      . polyethylene glycol (MIRALAX /  GLYCOLAX) packet 17 g  17 g Oral Daily PRN Persons, Bevely Palmer, PA      . polyvinyl alcohol (LIQUIFILM TEARS) 1.4 % ophthalmic solution 1 drop  1 drop Both Eyes Q8H PRN Persons, Bevely Palmer, PA      . protein supplement (ENSURE MAX) liquid  11 oz Oral QHS Persons, Bevely Palmer, [place]   11 oz at 06/25/20 2136  . senna-docusate (Senokot-S) tablet 1 tablet  1 tablet Oral QHS Persons, Bevely Palmer, [place]   1 tablet at 06/25/20 2135  . tamsulosin (FLOMAX) capsule 0.8 mg  0.8 mg Oral QPC supper Persons, Bevely Palmer, PA   0.8 mg at 06/24/20 1701  . zinc sulfate capsule 220 mg  220 mg Oral Daily Persons, Bevely Palmer, [place]   220 mg at 06/26/20 5009     Discharge Medications: Please see discharge summary for a list of discharge medications.  Relevant Imaging Results:  Relevant Lab Results:   Additional Information SS#: [id number]  Geralynn Ochs, LCSW

## 2020-06-26 NOTE — TOC Initial Note (Signed)
Transition of Care [facility]) - Initial/Assessment Note    Patient Details  Name: [name] MRN: [id number] Date of Birth: 05-29-1955  [facility] CM/SW Contact:    Geralynn Ochs, LCSW Phone Number: 06/26/2020, 12:04 PM  Clinical Narrative:     Patient is long term care at [facility], plans to return. CSW sent information to [name] and left a message to confirm that they are able to accept patient back when stable, awaiting response. CSW to follow.              Expected Discharge Plan: Skilled Nursing Facility Barriers to Discharge: Continued Medical Work up   Patient Goals and CMS Choice Patient states their goals for this hospitalization and ongoing recovery are:: feel better CMS Medicare.gov Compare Post Acute Care list provided to:: Patient Choice offered to / list presented to : Patient  Expected Discharge Plan and Services Expected Discharge Plan: [place] Choice: [place] Living arrangements for the past 2 months: [place]                                      Prior Living Arrangements/Services Living arrangements for the past 2 months: [place] Lives with:: Facility Resident Patient language and need for interpreter reviewed:: No Do you feel safe going back to the place where you live?: Yes      Need for Family Participation in Patient Care: No (Comment) Care giver support system in place?: Yes (comment)   Criminal Activity/Legal Involvement Pertinent to Current Situation/Hospitalization: No - Comment as needed  Activities of Daily Living      Permission Sought/Granted Permission sought to share information with : Chartered certified accountant granted to share information with : Yes, Verbal Permission Granted     Permission granted to share info w AGENCY: [name]        Emotional Assessment Appearance:: Appears stated  age Attitude/Demeanor/Rapport: Engaged Affect (typically observed): Appropriate Orientation: : Oriented to Self,Oriented to Place,Oriented to  Time,Oriented to Situation Alcohol / Substance Use: Not Applicable Psych Involvement: No (comment)  Admission diagnosis:  Foot infection [L08.9] Necrotizing fasciitis of lower leg ([place]) [M72.6] Patient Active Problem List   Diagnosis Date Noted  . Malnutrition of moderate degree 06/24/2020  . Type 2 diabetes mellitus with diabetic peripheral angiopathy and gangrene, without long-term current use of insulin ([place] Run)   . Necrotizing fasciitis of lower leg ([place]) 06/22/2020  . Type 2 diabetes mellitus with foot ulcer and gangrene ([place])   . Abscess of left foot   . Foot infection   . Focal motor seizure disorder ([place]) 05/06/2017  . Irregular heart rate   . Acute encephalopathy 01/31/2017  . Cellulitis of right upper extremity   . Anasarca   . Altered mental status   . Slurred speech   . Hypothermia 01/30/2017  . Pressure injury of skin 12/21/2016  . CHF exacerbation ([place]) 12/20/2016  . Peripheral edema   . Benign prostatic hyperplasia with lower urinary tract symptoms   . Embolic stroke ([place]) 02/58/5277  . Cardiomyopathy, dilated (Henderson) 10/19/2016  . Acute on chronic systolic heart failure, NYHA class 3 ([place]) 10/19/2016  . Moderate to severe pulmonary hypertension ([place]) 10/19/2016  . Severe tricuspid regurgitation by prior echocardiogram 10/19/2016  . Diabetes mellitus type 2 in obese ([place] Creek) 10/19/2016  . HLD (hyperlipidemia) 10/19/2016  .  Seizure ([place]) 10/19/2016  . Fluid overload 10/14/2016  . 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 ([place]arcourt) 10/09/2016  . Cardiorenal syndrome with renal failure 09/28/2016  . Acute urinary retention 09/26/2016  . Acute on chronic combined systolic and diastolic CHF (congestive heart failure) ([place]) 09/26/2016  . Scrotal edema 09/24/2016  . Multiple open wounds of  lower leg, initial encounter 09/24/2016  . Tinea of groin 09/24/2016  . Chronic kidney disease (CKD), stage III (moderate) (Terral) 06/07/2016  . Chronic combined systolic and diastolic congestive heart failure ([place])   . Protein calorie malnutrition ([place]) 07/12/2015  . Congestive dilated cardiomyopathy ([place]) 07/11/2015  . Pressure ulcer 07/09/2015  . Open knee wound 07/06/2015  . History of right below knee amputation ([place]) 02/22/2015  . PAD (peripheral artery disease) (Cum[place]) 02/22/2015  . Blindness of left eye 01/04/2015  . Complications, amputation stump late (Fillmore) 08/06/2014  . Diabetic neuropathy associated with type 2 diabetes mellitus ([place]) 12/01/2013  . Noncompliance with medications 07/13/2010  . OTHER SPEC TYPES SCHIZOPHRENIA UNSPEC CONDITION 05/24/2009  . Obesity, unspecified 06/09/2007  . DM (diabetes mellitus), type 2, uncontrolled ([place]op[place]) 05/02/2006  . Essential hypertension 05/02/2006   PCP:  No primary care provider on file. Pharmacy:  No Pharmacies Listed    Social Determinants of Health (SDOH) Interventions    Readmission Risk Interventions No flowsheet data found.

## 2020-06-27 DIAGNOSIS — I110 Hypertensive heart disease with heart failure: Secondary | ICD-10-CM | POA: Diagnosis not present

## 2020-06-27 DIAGNOSIS — L0889 Other specified local infections of the skin and subcutaneous tissue: Secondary | ICD-10-CM | POA: Diagnosis not present

## 2020-06-27 DIAGNOSIS — E1165 Type 2 diabetes mellitus with hyperglycemia: Secondary | ICD-10-CM

## 2020-06-27 DIAGNOSIS — M726 Necrotizing fasciitis: Secondary | ICD-10-CM | POA: Diagnosis not present

## 2020-06-27 DIAGNOSIS — Z7984 Long term (current) use of oral hypoglycemic drugs: Secondary | ICD-10-CM

## 2020-06-27 DIAGNOSIS — E1142 Type 2 diabetes mellitus with diabetic polyneuropathy: Secondary | ICD-10-CM | POA: Diagnosis not present

## 2020-06-27 LAB — CULTURE, BLOOD (ROUTINE X 2)
Culture: NO GROWTH
Culture: NO GROWTH
Special Requests: ADEQUATE

## 2020-06-27 LAB — CBC WITH DIFFERENTIAL/PLATELET
Abs Immature Granulocytes: 0.1 10*3/uL — ABNORMAL HIGH (ref 0.00–0.07)
Basophils Absolute: 0 10*3/uL (ref 0.0–0.1)
Basophils Relative: 0 %
Eosinophils Absolute: 0.1 10*3/uL (ref 0.0–0.5)
Eosinophils Relative: 1 %
HCT: 33 % — ABNORMAL LOW (ref 39.0–52.0)
Hemoglobin: 10.3 g/dL — ABNORMAL LOW (ref 13.0–17.0)
Immature Granulocytes: 1 %
Lymphocytes Relative: 12 %
Lymphs Abs: 1.5 10*3/uL (ref 0.7–4.0)
MCH: 27.1 pg (ref 26.0–34.0)
MCHC: 31.2 g/dL (ref 30.0–36.0)
MCV: 86.8 fL (ref 80.0–100.0)
Monocytes Absolute: 0.8 10*3/uL (ref 0.1–1.0)
Monocytes Relative: 6 %
Neutro Abs: 10.1 10*3/uL — ABNORMAL HIGH (ref 1.7–7.7)
Neutrophils Relative %: 80 %
Platelets: 514 10*3/uL — ABNORMAL HIGH (ref 150–400)
RBC: 3.8 MIL/uL — ABNORMAL LOW (ref 4.22–5.81)
RDW: 13.3 % (ref 11.5–15.5)
WBC: 12.5 10*3/uL — ABNORMAL HIGH (ref 4.0–10.5)
nRBC: 0 % (ref 0.0–0.2)

## 2020-06-27 LAB — SARS CORONAVIRUS 2 (TAT 6-24 HRS): SARS Coronavirus 2: NEGATIVE

## 2020-06-27 LAB — BASIC METABOLIC PANEL
Anion gap: 4 — ABNORMAL LOW (ref 5–15)
BUN: 27 mg/dL — ABNORMAL HIGH (ref 8–23)
CO2: 31 mmol/L (ref 22–32)
Calcium: 8.6 mg/dL — ABNORMAL LOW (ref 8.9–10.3)
Chloride: 102 mmol/L (ref 98–111)
Creatinine, Ser: 1.28 mg/dL — ABNORMAL HIGH (ref 0.61–1.24)
GFR, Estimated: >60 mL/min (ref >60)
Glucose, Bld: 327 mg/dL — ABNORMAL HIGH (ref 70–99)
Potassium: 4.3 mmol/L (ref 3.5–5.1)
Sodium: 137 mmol/L (ref 135–145)

## 2020-06-27 LAB — GLUCOSE, CAPILLARY
Glucose-Capillary: 311 mg/dL — ABNORMAL HIGH (ref 70–99)
Glucose-Capillary: 291 mg/dL — ABNORMAL HIGH (ref 70–99)
Glucose-Capillary: 96 mg/dL (ref 70–99)

## 2020-06-27 LAB — SURGICAL PATHOLOGY

## 2020-06-27 MED ORDER — INSULIN GLARGINE 100 UNIT/ML ~~LOC~~ SOLN: 28.0000 [IU] | Freq: Every day | SUBCUTANEOUS | Status: DC

## 2020-06-27 MED ORDER — INSULIN GLARGINE 100 UNIT/ML ~~LOC~~ SOLN
26.0000 [IU] | Freq: Every day | SUBCUTANEOUS | Status: DC
  Administered 2020-06-27: 26 [IU] via SUBCUTANEOUS
  Filled 2020-06-27 (×2): qty 0.26

## 2020-06-27 MED ORDER — INSULIN GLARGINE 100 UNIT/ML ~~LOC~~ SOLN: 20.0000 [IU] | Freq: Every day | SUBCUTANEOUS | 2 refills | Status: DC

## 2020-06-27 NOTE — Progress Notes (Signed)
Report called to receiving rn at [facility]. RN familiar with  Patient. Awaiting PTAR. Patient with no complaints at this time.

## 2020-06-27 NOTE — Discharge Summary (Addendum)
Name: [name] MRN: [id number] DOB: [birth year] 65 y.o. PCP: No primary care provider on file.  Date of Admission: 06/22/2020 12:54 PM Date of Discharge:  06/27/2020 Attending Physician: Velna Ochs, MD  Discharge Diagnosis: 1.  Necrotizing skin infection of the left foot status post left transtibial amputation on 06/24/2020  Discharge Medications: Allergies as of 06/27/2020   No Known Allergies      Medication List     STOP taking these medications    cephALEXin 500 MG capsule Commonly known as: KEFLEX   doxycycline 100 MG tablet Commonly known as: VIBRA-TABS   sacubitril-valsartan 49-51 MG Commonly known as: Entresto   torsemide 20 MG tablet Commonly known as: DEMADEX       TAKE these medications    acetaminophen 325 MG tablet Commonly known as: TYLENOL Take 650 mg by mouth every 6 (six) hours as needed for mild pain.   Altalube 85-15 % Oint Place 1 application into both eyes at bedtime.   aspirin 81 MG EC tablet Take 81 mg by mouth daily.   atorvastatin 40 MG tablet Commonly known as: LIPITOR Take 40 mg by mouth at bedtime.   bisoprolol 5 MG tablet Commonly known as: ZEBETA Take 1 tablet (5 mg total) by mouth daily. What changed:  when to take this reasons to take this   brimonidine 0.2 % ophthalmic solution Commonly known as: ALPHAGAN PLACE 1 DROP INTO THE LEFT EYE 2 (TWO) TIMES DAILY. What changed:  how much to take how to take this when to take this additional instructions   chlorhexidine 4 % external liquid Commonly known as: HIBICLENS Apply 1 application topically daily. Apply to left foot   divalproex 125 MG DR tablet Commonly known as: DEPAKOTE Take 125 mg by mouth 2 (two) times daily.   dorzolamide-timolol 22.3-6.8 MG/ML ophthalmic solution Commonly known as: COSOPT Place 1 drop into the left eye 2 (two) times daily. What changed: how to take this   feeding supplement (GLUCERNA SHAKE) Liqd Take 237 mLs by mouth  daily.   insulin glargine 100 UNIT/ML injection Commonly known as: LANTUS Inject 0.2 mLs (20 Units total) into the skin at bedtime.   levETIRAcetam 750 MG tablet Commonly known as: KEPPRA Take 1 tablet (750 mg total) by mouth 2 (two) times daily.   metFORMIN 1000 MG tablet Commonly known as: GLUCOPHAGE Take 1,000 mg by mouth 2 (two) times daily with a meal.   mirtazapine 7.5 MG tablet Commonly known as: REMERON Take 7.5 mg by mouth at bedtime.   pantoprazole 20 MG tablet Commonly known as: PROTONIX Take 20 mg by mouth daily.   polyethylene glycol 17 g packet Commonly known as: MIRALAX / GLYCOLAX Take 17 g by mouth 2 (two) times daily.   Pro-Stat Liqd Take 30 mLs by mouth 2 (two) times daily.   senna-docusate 8.6-50 MG tablet Commonly known as: Senokot-S Take 1 tablet by mouth at bedtime.   sitaGLIPtin 100 MG tablet Commonly known as: JANUVIA Take 100 mg by mouth daily.   Systane 0.4-0.3 % Soln Generic drug: Polyethyl Glycol-Propyl Glycol Place 1 drop into both eyes every 8 (eight) hours.   tamsulosin 0.4 MG Caps capsule Commonly known as: FLOMAX Take 2 capsules (0.8 mg total) by mouth daily after supper.               Discharge Care Instructions  (From admission, onward)           Start     Ordered   06/27/20  0000  No dressing needed        06/27/20 1358            Disposition and follow-up:   [name] was discharged from [facility] in Stable condition.  At the hospital follow up visit please address:  1.  Necrotizing skin infection of the left foot status post left transtibial amputation on 06/24/2020.  Postop day 3 today.  Wound VAC in place for 1 week after discharge.  Follow-up with orthopedics 1 week postop.    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 On metformin 1000mg  BID and Januvia 100 mg daily.  Hemoglobin A1c 8.0% on 06/22/2020.  Lantus 20 units QHS added on discharge.  Chronic systolic  heart failure.  Echo on 06/23/2020 showing LVEF 50 to 55%, regional wall motion abnormalities and inferior basal hypokinesis.  Patient developed pre renal AKI, improved with IVFs and discontinuation of his torsemide. His Entresto, bisoprolol, and torsemide were all held on discharge. Please follow up BP and volume status.    AKI.  Likely prerenal.  Creatinine down to 1.28 at discharge.  Holding home torsemide at discharge.  Please assess patient's volume status.  2.  Labs / imaging needed at time of follow-up: CBC, CMP  3.  Pending labs/ test needing follow-up: none  Follow-up Appointments:  Follow-up Information     Suzan Slick, NP In 1 week.   Specialty: Orthopedic Surgery Contact information: [address] [place] [address] [place] by problem list: 1. Necrotizing skin infection of the Left Foot.  Patient presented from ALF with left foot wound found to have necrotizing skin infection on left foot x-ray.  Likely due to underlying uncontrolled diabetes.  Apparently medical director Cordia's saw patient earlier in the week prior to admission to convince him to go to the ER for foot wound but patient declined and patient's sister was also unable to convince him.  Later presented after developing systemic signs of infection.  Also has history of significant peripheral arterial disease previously evaluated by [facility], not amendable to revascularization.  Patient does have a history of right transtibial amputation about 7 years ago secondary to diabetic insensate neuropathy.  At home, patient is on metformin 1000 mg twice daily and Januvia 100 mg daily.  Hemoglobin A1c upon admission and 8.0%.  IV antibiotics were started with vancomycin, Zosyn, and clindamycin.  Maintenance fluid rehydration was performed with lactated Ringer's.  Underwent left transtibial amputation with orthopedics, Dr. Sharol Given, on 06/24/2020.  Per PT/OT recommendations, patient will  need SNF placement, which can be done at [facility].  He will be going today.  2. Type 2 diabetes mellitus with neuropathy.  Patient's hemoglobin A1c this admission 8.0.  Started on basal bolus insulin but his blood glucose levels stayed elevated despite increasing doses basal bolus.  In the postop setting following transtibial amputation, he will need more optimal glycemic control.  We will continue his metformin and Januvia, but will add on basal insulin (Lantus) at 20 units nightly for more optimal control.  Please follow up CBGs, titrate as needed.   3. Chronic systolic heart failure. Hypertension. Chronic and stable. Echo on 4/21 showing improvement of LVEF to 50-55%, but with regional wall motion abnormalities and inferior basal hypokinesis. Small pericardial effusion is present. LV diastolic parameters are normal.  Antihypertensives were held on admission due to sepsis. However blood pressure  remains well controlled off medication. His home entresto, prn bisoprolol, and torsemide were held on discharge. Please follow up and restart if indicated.    4. Acute kidney injury. Resolved after holding home torsemide and IVF resuscitation. Appeared hypovolemic, likely prerenal in etiology in the setting of sepsis. Cr on admission 2.03, today 1.28 which is lower than last known baseline. Torsemide held on discharge. Please reassess volume status.    Discharge Exam:   BP (!) 141/87 (BP Location: Left Arm)   Pulse 68   Temp 99.5 F (37.5 C) (Oral)   Resp 17   Ht 6' (1.829 m)   Wt 91.1 kg   SpO2 96%   BMI 27.24 kg/m  Discharge exam:  General: Elderly male lying in bed, no acute distress. Cardiovascular: Normal rate and regular rhythm, no murmurs rubs or gallops. Pulmonary: Respiratory effort is normal, clear to auscultation bilaterally. MSK: Left lower extremity with wound VAC in place, bilateral BKA's. Neuro: AAOx4, no focal deficits.   Pertinent Labs, Studies, and Procedures:   CBC Latest  Ref Rng & Units 06/27/2020 06/26/2020 06/25/2020  WBC 4.0 - 10.5 K/uL 12.5(H) 15.9(H) 16.5(H)  Hemoglobin 13.0 - 17.0 g/dL 10.3(L) 10.3(L) 10.0(L)  Hematocrit 39.0 - 52.0 % 33.0(L) 32.9(L) 31.5(L)  Platelets 150 - 400 K/uL 514(H) 538(H) 492(H)   CMP Latest Ref Rng & Units 06/27/2020 06/26/2020 06/25/2020  Glucose 70 - 99 mg/dL 327(H) 270(H) 245(H)  BUN 8 - 23 mg/dL 27(H) 48(H) 69(H)  Creatinine 0.61 - 1.24 mg/dL 1.28(H) 1.43(H) 1.74(H)  Sodium 135 - 145 mmol/L 137 136 135  Potassium 3.5 - 5.1 mmol/L 4.3 3.9 3.6  Chloride 98 - 111 mmol/L 102 101 99  CO2 22 - 32 mmol/L 31 29 27   Calcium 8.9 - 10.3 mg/dL 8.6(L) 8.4(L) 8.3(L)  Total Protein 6.5 - 8.1 g/dL - - -  Total Bilirubin 0.3 - 1.2 mg/dL - - -  Alkaline Phos 38 - 126 U/L - - -  AST 15 - 41 U/L - - -  ALT 0 - 44 U/L - - -    Urinalysis    Component Value Date/Time   COLORURINE YELLOW 06/23/2020 0937   APPEARANCEUR HAZY (A) 06/23/2020 0937   LABSPEC 1.016 06/23/2020 0937   PHURINE 5.0 06/23/2020 0937   GLUCOSEU NEGATIVE 06/23/2020 0937   HGBUR NEGATIVE 06/23/2020 0937   BILIRUBINUR NEGATIVE 06/23/2020 0937   KETONESUR NEGATIVE 06/23/2020 0937   PROTEINUR NEGATIVE 06/23/2020 0937   UROBILINOGEN 0.2 07/17/2014 2259   NITRITE NEGATIVE 06/23/2020 0937   LEUKOCYTESUR NEGATIVE 06/23/2020 0937    Erythrocyte Sedimentation Rate     Component Value Date/Time   ESRSEDRATE 104 (H) 06/22/2020 1400   HbA1c 8.0 (on 06/22/2020)    Discharge Instructions:     Discharge Instructions     Negative Pressure Wound Therapy - Incisional   Complete by: As directed      Show patient how to attach preveena vac         Signed: Virl Axe, MD 06/27/2020, 12:16 PM   Pager: [phone]

## 2020-06-27 NOTE — TOC Transition Note (Addendum)
Transition of Care [facility]) - CM/SW Discharge Note   Patient Details  Name: KENRICK PORE MRN: [id number] Date of Birth: [birth year]  Transition of Care [facility]) CM/SW Contact:  Emeterio Reeve, [place] Phone Number: 06/27/2020, 12:44 PM   Clinical Narrative:     Patient will DC to: [name] Anticipated DC date: 06/27/20 Family notified:  Transport by: Corey Harold     Per MD patient ready for DC to [facility]. RN, patient, patient's family, and facility notified of DC. Discharge Summary and FL2 sent to facilityDC packet on chart. Ambulance transport requested for patient.    RN to call report to [phone].  CSW will sign off for now as social work intervention is no longer needed. Please consult [place] again if new needs arise.   Final next level of care: Skilled Nursing Facility Barriers to Discharge: Barriers Resolved   Patient Goals and CMS Choice Patient states their goals for this hospitalization and ongoing recovery are:: feel better CMS Medicare.gov Compare Post Acute Care list provided to:: Patient Choice offered to / list presented to : Patient  Discharge Placement              Patient chooses bed at: Other - please specify in the comment section below: Patient to be transferred to facility by: ptar   Patient and family notified of of transfer: 06/27/20  Discharge Plan and Services     Post Acute Care Choice: [place]                               Social Determinants of Health (SDOH) Interventions     Readmission Risk Interventions No flowsheet data found.  Emeterio Reeve, Latanya Presser, [place] Social Worker [phone]

## 2020-07-05 ENCOUNTER — Ambulatory Visit (INDEPENDENT_AMBULATORY_CARE_PROVIDER_SITE_OTHER): Payer: Medicaid Other | Admitting: Family

## 2020-07-05 DIAGNOSIS — Z89512 Acquired absence of left leg below knee: Secondary | ICD-10-CM

## 2020-07-05 NOTE — Progress Notes (Signed)
Post-Op Visit Note   Patient: [name]           Date of Birth: 1955-06-04           MRN: [id number] Visit Date: 07/05/2020 PCP: No primary care provider on file.  Chief Complaint: No chief complaint on file.   HPI:  HPI The patient is a 65 year old gentleman seen status post left below-knee amputation.  Wound VAC removed today.  He states that it turned off over the weekend.  Ortho Exam Incision is well approximated with staples there is no drainage no surrounding no erythema no warmth  Visit Diagnoses: No diagnosis found.  Plan: Begin daily Dial soap cleansing.  May wear shrinker with direct skin contact.  He is residing at skilled nursing.  Instructions were sent.  He will follow-up in 2 weeks for staple removal.  Follow-Up Instructions: No follow-ups on file.   Imaging: No results found.  Orders:  No orders of the defined types were placed in this encounter.  No orders of the defined types were placed in this encounter.    PMFS History: Patient Active Problem List   Diagnosis Date Noted  . Malnutrition of moderate degree 06/24/2020  . Type 2 diabetes mellitus with diabetic peripheral angiopathy and gangrene, without long-term current use of insulin ([place])   . Necrotizing fasciitis of lower leg ([place]) 06/22/2020  . Type 2 diabetes mellitus with foot ulcer and gangrene ([place])   . Abscess of left foot   . Foot infection   . Focal motor seizure disorder ([place]) 05/06/2017  . Irregular heart rate   . Acute encephalopathy 01/31/2017  . Cellulitis of right upper extremity   . Anasarca   . Altered mental status   . Slurred speech   . Hypothermia 01/30/2017  . Pressure injury of skin 12/21/2016  . CHF exacerbation ([place]) 12/20/2016  . Peripheral edema   . Benign prostatic hyperplasia with lower urinary tract symptoms   . Embolic stroke ([place]) 15/40/0867  . 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 Acute on chronic systolic heart failure, NYHA class 3 ([place])  10/19/2016  . Moderate to severe pulmonary hypertension ([place]) 10/19/2016  . Severe tricuspid regurgitation by prior echocardiogram 10/19/2016  . Diabetes mellitus type 2 in obese ([place]) 10/19/2016  . HLD (hyperlipidemia) 10/19/2016  . Seizure ([place]) 10/19/2016  . Fluid overload 10/14/2016  . 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 Cardiorenal syndrome with renal failure 09/28/2016  . Acute urinary retention 09/26/2016  . Acute on chronic combined systolic and diastolic CHF (congestive heart failure) ([place]) 09/26/2016  . Scrotal edema 09/24/2016  . Multiple open wounds of lower leg, initial encounter 09/24/2016  . Tinea of groin 09/24/2016  . Chronic kidney disease (CKD), stage III (moderate) ([place]) 06/07/2016  . Chronic combined systolic and diastolic congestive heart failure ([place])   . Protein calorie malnutrition ([place]) 07/12/2015  . Congestive dilated cardiomyopathy ([place]) 07/11/2015  . Pressure ulcer 07/09/2015  . Open knee wound 07/06/2015  . History of right below knee amputation ([place]) 02/22/2015  . PAD (peripheral artery disease) ([place]) 02/22/2015  . Blindness of left eye 01/04/2015  . Complications, amputation stump late ([place]) 08/06/2014  . Diabetic neuropathy associated with type 2 diabetes mellitus ([place]) 12/01/2013  . Noncompliance with medications 07/13/2010  . OTHER SPEC TYPES SCHIZOPHRENIA UNSPEC CONDITION 05/24/2009  . Obesity, unspecified 06/09/2007  . DM (diabetes mellitus), type 2, uncontrolled ([place]) 05/02/2006  . Essential hypertension 05/02/2006  Past Medical History:  Diagnosis Date  . Acute on chronic systolic heart failure, NYHA class 3 ([place])   . AKI (acute kidney injury) ([place])   . Anemia   . BKA stump complication ([place])   . Blind left eye   . Blind left eye   . Cardiomyopathy, dilated ([place])   . CHF (congestive heart failure) ([place])   . Combined systolic and diastolic congestive heart failure  ([place])   . Congestive dilated cardiomyopathy ([place]) 07/11/2015  . Diabetes mellitus   . Diabetic neuropathy associated with type 2 diabetes mellitus ([place])   . 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 Hypertension   . Neuropathy   . Noncompliance with medications   . Open knee wound 10/2015   on rt bka  . PAD (peripheral artery disease) ([place])   . Protein calorie malnutrition ([place])   . Shortness of breath dyspnea     Family History  Problem Relation Age of Onset  . Cancer Mother   . Peripheral vascular disease Father     Past Surgical History:  Procedure Laterality Date  . ABDOMINAL AORTOGRAM W/LOWER EXTREMITY N/A 03/25/2017   Procedure: ABDOMINAL AORTOGRAM W/LOWER EXTREMITY;  Surgeon: Angelia Mould, MD;  Location: [facility];  Service: Cardiovascular;  Laterality: N/A;  . AMPUTATION Right 09/26/2013   Procedure: AMPUTATION BELOW KNEE;  Surgeon: Rozanna Box, MD;  Location: [facility];  Service: Orthopedics;  Laterality: Right;  . AMPUTATION Right 05/01/2015   Procedure: REVISION OF RIGHT TRANSTIBIAL AMPUTATION ;  Surgeon: Newt Minion, MD;  Location: [place] [place];  Service: Orthopedics;  Laterality: Right;  . AMPUTATION Left 06/24/2020   Procedure: LEFT BELOW KNEE AMPUTATION;  Surgeon: Newt Minion, MD;  Location: [facility];  Service: Orthopedics;  Laterality: Left;  . APPLICATION OF WOUND VAC  08/10/2016   Procedure: APPLICATION OFI NCISONAL  WOUND VAC;  Surgeon: Newt Minion, MD;  Location: [facility];  Service: Orthopedics;;  . CARDIAC CATHETERIZATION N/A 07/13/2015   Procedure: Right/Left Heart Cath and Coronary Angiography;  Surgeon: Jolaine Artist, MD;  Location: [place];  Service: Cardiovascular;  Laterality: N/A;  . COLONOSCOPY    . I & D EXTREMITY Right 09/24/2013   Procedure: IRRIGATION AND DEBRIDEMENT RIGHT FOOT ULCER;  Surgeon: Renette Butters, MD;  Location: [facility];  Service: Orthopedics;  Laterality: Right;  . I & D EXTREMITY Right  09/26/2013   Procedure: Repeat IRRIGATION AND DEBRIDEMENT Right Foot Ulcer;  Surgeon: Rozanna Box, MD;  Location: [place];  Service: Orthopedics;  Laterality: Right;  . MULTIPLE TOOTH EXTRACTIONS    . SKIN SPLIT GRAFT Right 08/10/2016   Procedure: SKIN GRAFT SPLIT THICKNESS RIGHT LEG;  Surgeon: Newt Minion, MD;  Location: [facility];  Service: Orthopedics;  Laterality: Right;  . STUMP REVISION Right 04/20/2015   Procedure: Revision Right Below Knee Amputation, Apply Wound VAC;  Surgeon: Newt Minion, MD;  Location: [facility];  Service: Orthopedics;  Laterality: Right;  . TONSILLECTOMY     Social History   Occupational History  . Not on file  Tobacco Use  . Smoking status: Former Smoker    Years: 20.00    Quit date: 05/10/2000    Years since quitting: 20.1  . Smokeless tobacco: Former Systems developer    Quit date: 02/22/2000  Vaping Use  . Vaping Use: Never used  Substance and Sexual Activity  . Alcohol use: No    Alcohol/week: 0.0  standard drinks  . Drug use: No  . Sexual activity: Not on file

## 2020-07-06 ENCOUNTER — Encounter: Payer: Self-pay | Admitting: Family

## 2020-07-19 ENCOUNTER — Other Ambulatory Visit: Payer: Self-pay

## 2020-07-19 ENCOUNTER — Encounter: Payer: Self-pay | Admitting: Family

## 2020-07-19 ENCOUNTER — Ambulatory Visit (INDEPENDENT_AMBULATORY_CARE_PROVIDER_SITE_OTHER): Payer: Medicaid Other | Admitting: Family

## 2020-07-19 DIAGNOSIS — Z89512 Acquired absence of left leg below knee: Secondary | ICD-10-CM

## 2020-07-19 NOTE — Progress Notes (Signed)
Post-Op Visit Note   Patient: [name]           Date of Birth: [birth year]           MRN: [id number] Visit Date: 07/19/2020 PCP: No primary care provider on file.  Chief Complaint:  Chief Complaint  Patient presents with  . Other    S/p BKA    HPI:  HPI The patient is a 64 year old gentleman who presents status post left below-knee amputation 3 weeks ago staples are in place.  He is wearing a limb protector Ortho Exam On examination of the left residual limb this is healing well staples harvested today without incident there is minimal bloody drainage laterally.  There is no gaping no erythema no sign of infection  Visit Diagnoses:  1. History of left below knee amputation ([place])     Plan: Discussed prosthesis set up with the patient.  He is unsure about it at this time.  He will follow-up once more in 2 weeks.  Until that time they will continue daily Dial soap cleansing dry dressings shrinker or limb protector have asked for PT evaluation for second below-knee amputation prosthesis set up  Follow-Up Instructions: Return in about 2 weeks (around 08/02/2020).   Imaging: No results found.  Orders:  No orders of the defined types were placed in this encounter.  No orders of the defined types were placed in this encounter.    PMFS History: Patient Active Problem List   Diagnosis Date Noted  . Malnutrition of moderate degree 06/24/2020  . Type 2 diabetes mellitus with diabetic peripheral angiopathy and gangrene, without long-term current use of insulin ([place])   . Necrotizing fasciitis of lower leg ([place]) 06/22/2020  . Type 2 diabetes mellitus with foot ulcer and gangrene ([place])   . Abscess of left foot   . Foot infection   . 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 Irregular heart rate   . Acute encephalopathy 01/31/2017  . Cellulitis of right upper extremity   . Anasarca   . Altered mental status   . Slurred speech   . Hypothermia 01/30/2017  .  Pressure injury of skin 12/21/2016  . CHF exacerbation ([place]) 12/20/2016  . Peripheral edema   . Benign prostatic hyperplasia with lower urinary tract symptoms   . Embolic stroke ([place]) 29/93/7169  . 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 Acute on chronic systolic heart failure, NYHA class 3 ([place]) 10/19/2016  . Moderate to severe pulmonary hypertension ([place]) 10/19/2016  . Severe tricuspid regurgitation by prior echocardiogram 10/19/2016  . Diabetes mellitus type 2 in obese ([place]) 10/19/2016  . HLD (hyperlipidemia) 10/19/2016  . Seizure ([place]) 10/19/2016  . Fluid overload 10/14/2016  . 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 ([place]) 10/09/2016  . Cardiorenal syndrome with renal failure 09/28/2016  . Acute urinary retention 09/26/2016  . Acute on chronic combined systolic and diastolic CHF (congestive heart failure) ([place]) 09/26/2016  . Scrotal edema 09/24/2016  . Multiple open wounds of lower leg, initial encounter 09/24/2016  . Tinea of groin 09/24/2016  . Chronic kidney disease (CKD), stage III (moderate) ([place]) 06/07/2016  . Chronic combined systolic and diastolic congestive heart failure ([place])   . Protein calorie malnutrition ([place]) 07/12/2015  . Congestive dilated cardiomyopathy ([place]) 07/11/2015  . Pressure ulcer 07/09/2015  . Open knee wound 07/06/2015  . History of right below knee amputation ([place]) 02/22/2015  . PAD (peripheral artery disease) ([place]) 02/22/2015  . Blindness  of left eye 01/04/2015  . Complications, amputation stump late ([place]) 08/06/2014  . Diabetic neuropathy associated with type 2 diabetes mellitus ([place]) 12/01/2013  . Noncompliance with medications 07/13/2010  . OTHER SPEC TYPES SCHIZOPHRENIA UNSPEC CONDITION 05/24/2009  . Obesity, unspecified 06/09/2007  . DM (diabetes mellitus), type 2, uncontrolled ([place]) 05/02/2006  . Essential hypertension 05/02/2006   Past Medical History:  Diagnosis Date  . Acute on  chronic systolic heart failure, NYHA class 3 ([place])   . AKI (acute kidney injury) ([place])   . Anemia   . BKA stump complication ([place])   . Blind left eye   . Blind left eye   . Cardiomyopathy, dilated ([place])   . CHF (congestive heart failure) ([place])   . Combined systolic and diastolic congestive heart failure ([place])   . Congestive dilated cardiomyopathy ([place]) 07/11/2015  . Diabetes mellitus   . Diabetic neuropathy associated with type 2 diabetes mellitus ([place])   . 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 Hypertension   . Neuropathy   . Noncompliance with medications   . Open knee wound 10/2015   on rt bka  . PAD (peripheral artery disease) ([place])   . Protein calorie malnutrition ([place])   . Shortness of breath dyspnea     Family History  Problem Relation Age of Onset  . Cancer Mother   . Peripheral vascular disease Father     Past Surgical History:  Procedure Laterality Date  . ABDOMINAL AORTOGRAM W/LOWER EXTREMITY N/A 03/25/2017   Procedure: ABDOMINAL AORTOGRAM W/LOWER EXTREMITY;  Surgeon: Angelia Mould, MD;  Location: [facility];  Service: Cardiovascular;  Laterality: N/A;  . AMPUTATION Right 09/26/2013   Procedure: AMPUTATION BELOW KNEE;  Surgeon: Rozanna Box, MD;  Location: [facility];  Service: Orthopedics;  Laterality: Right;  . AMPUTATION Right 05/01/2015   Procedure: REVISION OF RIGHT TRANSTIBIAL AMPUTATION ;  Surgeon: Newt Minion, MD;  Location: [facility];  Service: Orthopedics;  Laterality: Right;  . AMPUTATION Left 06/24/2020   Procedure: LEFT BELOW KNEE AMPUTATION;  Surgeon: Newt Minion, MD;  Location: [facility];  Service: Orthopedics;  Laterality: Left;  . APPLICATION OF WOUND VAC  08/10/2016   Procedure: APPLICATION OFI NCISONAL  WOUND VAC;  Surgeon: Newt Minion, MD;  Location: [facility];  Service: Orthopedics;;  . CARDIAC CATHETERIZATION N/A 07/13/2015   Procedure: Right/Left Heart Cath and Coronary Angiography;  Surgeon: Jolaine Artist,  MD;  Location: [facility];  Service: Cardiovascular;  Laterality: N/A;  . COLONOSCOPY    . I & D EXTREMITY Right 09/24/2013   Procedure: IRRIGATION AND DEBRIDEMENT RIGHT FOOT ULCER;  Surgeon: Renette Butters, MD;  Location: [facility];  Service: Orthopedics;  Laterality: Right;  . I & D EXTREMITY Right 09/26/2013   Procedure: Repeat IRRIGATION AND DEBRIDEMENT Right Foot Ulcer;  Surgeon: Rozanna Box, MD;  Location: [facility];  Service: Orthopedics;  Laterality: Right;  . MULTIPLE TOOTH EXTRACTIONS    . SKIN SPLIT GRAFT Right 08/10/2016   Procedure: SKIN GRAFT SPLIT THICKNESS RIGHT LEG;  Surgeon: Newt Minion, MD;  Location: [place];  Service: Orthopedics;  Laterality: Right;  . STUMP REVISION Right 04/20/2015   Procedure: Revision Right Below Knee Amputation, Apply Wound VAC;  Surgeon: Newt Minion, MD;  Location: [facility];  Service: Orthopedics;  Laterality: Right;  . TONSILLECTOMY     Social History   Occupational History  . Not on file  Tobacco  Use  . Smoking status: Former Smoker    Years: 20.00    Quit date: 05/10/2000    Years since quitting: 20.2  . Smokeless tobacco: Former Systems developer    Quit date: 02/22/2000  Vaping Use  . Vaping Use: Never used  Substance and Sexual Activity  . Alcohol use: No    Alcohol/week: 0.0 standard drinks  . Drug use: No  . Sexual activity: Not on file

## 2020-07-21 ENCOUNTER — Telehealth: Payer: Self-pay

## 2020-07-21 ENCOUNTER — Ambulatory Visit (INDEPENDENT_AMBULATORY_CARE_PROVIDER_SITE_OTHER): Payer: Medicaid Other | Admitting: Physician Assistant

## 2020-07-21 ENCOUNTER — Encounter: Payer: Self-pay | Admitting: Physician Assistant

## 2020-07-21 DIAGNOSIS — Z89512 Acquired absence of left leg below knee: Secondary | ICD-10-CM

## 2020-07-21 NOTE — Telephone Encounter (Signed)
Shelly, wound care nurse at [facility] called stating that patient's left stump is gaping on the left side.  Would like a call back to discuss?  Cb# [phone], ask for wound care nurse.  Please advise.  Thank you.

## 2020-07-21 NOTE — Telephone Encounter (Signed)
Called and sw Shelly advised that the pt fell on 07/19/20 direct impact on limb offered appt today can not come in today. Will clal back to sch appt for tomorrow checking with transportation and will call back.

## 2020-07-21 NOTE — Progress Notes (Signed)
Office Visit Note   Patient: [name]           Date of Birth: [birth year]           MRN: [id number] Visit Date: 07/21/2020              Requested by: No referring provider defined for this encounter. PCP: No primary care provider on file.  No chief complaint on file.     HPI: Patient presents today he is status post left below-knee amputation and had his staples removed 2 days ago.  His nursing facility called and said he had taken a fall in labor concern for wound dehiscence  Assessment & Plan: Visit Diagnoses: No diagnosis found.  Plan: I wrote an order for him to obtain 2 XL shrinkers.  I explained to the nursing facility that shrinkers need to be next to the skin in order to heal.  He will follow-up at his regular visit in 2 weeks  Follow-Up Instructions: No follow-ups on file.   Ortho Exam  Patient is alert, oriented, no adenopathy, well-dressed, normal affect, normal respiratory effort. Wound is well approximated minimal bloody drainage no swelling no erythema no cellulitis.  He has just had a very small amount of superficial dehiscence over 1 side of the incision.  This does not probe deeply and is just beneath the skin  Imaging: No results found. No images are attached to the encounter.  Labs: Lab Results  Component Value Date   HGBA1C 8.0 (H) 06/22/2020   HGBA1C 7.2 (H) 10/14/2016   HGBA1C 7.9 (H) 09/24/2016   ESRSEDRATE 104 (H) 06/22/2020   ESRSEDRATE 114 (H) 09/23/2013   CRP 37.3 (H) 09/23/2013   REPTSTATUS 06/27/2020 FINAL 06/22/2020   GRAMSTAIN  09/24/2013    MODERATE WBC PRESENT,BOTH PMN AND MONONUCLEAR MODERATE GRAM POSITIVE COCCI IN PAIRS FEW GRAM VARIABLE ROD Performed at [facility]  09/24/2013    MODERATE WBC PRESENT,BOTH PMN AND MONONUCLEAR ABUNDANT GRAM POSITIVE COCCI IN PAIRS FEW GRAM VARIABLE ROD Performed at [facility]   CULT  06/22/2020    NO GROWTH 5 DAYS Performed at [facility] [address] [place], [place] [address]      Lab Results  Component Value Date   ALBUMIN 1.9 (L) 06/24/2020   ALBUMIN 2.0 (L) 06/23/2020   ALBUMIN 2.1 (L) 06/22/2020    Lab Results  Component Value Date   MG 1.8 10/25/2016   MG 1.9 10/20/2016   MG 2.0 10/17/2016   No results found for: VD25OH  No results found for: PREALBUMIN CBC EXTENDED Latest Ref Rng & Units 06/27/2020 06/26/2020 06/25/2020  WBC 4.0 - 10.5 K/uL 12.5(H) 15.9(H) 16.5(H)  RBC 4.22 - 5.81 MIL/uL 3.80(L) 3.83(L) 3.68(L)  HGB 13.0 - 17.0 g/dL 10.3(L) 10.3(L) 10.0(L)  HCT 39.0 - 52.0 % 33.0(L) 32.9(L) 31.5(L)  PLT 150 - 400 K/uL 514(H) 538(H) 492(H)  NEUTROABS 1.7 - 7.7 K/uL 10.1(H) - -  LYMPHSABS 0.7 - 4.0 K/uL 1.5 - -     There is no height or weight on file to calculate BMI.  Orders:  No orders of the defined types were placed in this encounter.  No orders of the defined types were placed in this encounter.    Procedures: No procedures performed  Clinical Data: No additional findings.  ROS:  All other systems negative, except as noted in the HPI. Review of Systems  Objective: Vital Signs: There were no vitals taken for  this visit.  Specialty Comments:  No specialty comments available.  PMFS History: Patient Active Problem List   Diagnosis Date Noted  . Malnutrition of moderate degree 06/24/2020  . Type 2 diabetes mellitus with diabetic peripheral angiopathy and gangrene, without long-term current use of insulin ([place])   . Necrotizing fasciitis of lower leg ([place]) 06/22/2020  . Type 2 diabetes mellitus with foot ulcer and gangrene ([place])   . Abscess of left foot   . Foot infection   . 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 Irregular heart rate   . Acute encephalopathy 01/31/2017  . Cellulitis of right upper extremity   . Anasarca   . Altered mental status   . Slurred speech   . Hypothermia 01/30/2017  . Pressure injury of skin 12/21/2016  . CHF exacerbation ([place]) 12/20/2016   . Peripheral edema   . Benign prostatic hyperplasia with lower urinary tract symptoms   . Embolic stroke ([place]) 30/16/0109  . 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 Acute on chronic systolic heart failure, NYHA class 3 ([place]) 10/19/2016  . Moderate to severe pulmonary hypertension ([place]) 10/19/2016  . Severe tricuspid regurgitation by prior echocardiogram 10/19/2016  . Diabetes mellitus type 2 in obese ([place]) 10/19/2016  . HLD (hyperlipidemia) 10/19/2016  . Seizure ([place]) 10/19/2016  . Fluid overload 10/14/2016  . 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 Cardiorenal syndrome with renal failure 09/28/2016  . Acute urinary retention 09/26/2016  . Acute on chronic combined systolic and diastolic CHF (congestive heart failure) ([place]) 09/26/2016  . Scrotal edema 09/24/2016  . Multiple open wounds of lower leg, initial encounter 09/24/2016  . Tinea of groin 09/24/2016  . Chronic kidney disease (CKD), stage III (moderate) ([place]) 06/07/2016  . Chronic combined systolic and diastolic congestive heart failure ([place])   . Protein calorie malnutrition ([place]) 07/12/2015  . Congestive dilated cardiomyopathy ([place]) 07/11/2015  . Pressure ulcer 07/09/2015  . Open knee wound 07/06/2015  . History of right below knee amputation ([place]) 02/22/2015  . PAD (peripheral artery disease) ([place]) 02/22/2015  . Blindness of left eye 01/04/2015  . Complications, amputation stump late ([place]) 08/06/2014  . Diabetic neuropathy associated with type 2 diabetes mellitus ([place]) 12/01/2013  . Noncompliance with medications 07/13/2010  . OTHER SPEC TYPES SCHIZOPHRENIA UNSPEC CONDITION 05/24/2009  . Obesity, unspecified 06/09/2007  . DM (diabetes mellitus), type 2, uncontrolled ([place]) 05/02/2006  . Essential hypertension 05/02/2006   Past Medical History:  Diagnosis Date  . Acute on chronic systolic heart failure, NYHA class 3 ([place])   . AKI (acute kidney  injury) ([place])   . Anemia   . BKA stump complication ([place])   . Blind left eye   . Blind left eye   . Cardiomyopathy, dilated ([place])   . CHF (congestive heart failure) ([place])   . Combined systolic and diastolic congestive heart failure ([place])   . Congestive dilated cardiomyopathy ([place]) 07/11/2015  . Diabetes mellitus   . Diabetic neuropathy associated with type 2 diabetes mellitus ([place])   . 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 Hypertension   . Neuropathy   . Noncompliance with medications   . Open knee wound 10/2015   on rt bka  . PAD (peripheral artery disease) ([place])   . Protein calorie malnutrition ([place])   . Shortness of breath dyspnea     Family History  Problem Relation Age of Onset  . Cancer Mother   .  Peripheral vascular disease Father     Past Surgical History:  Procedure Laterality Date  . ABDOMINAL AORTOGRAM W/LOWER EXTREMITY N/A 03/25/2017   Procedure: ABDOMINAL AORTOGRAM W/LOWER EXTREMITY;  Surgeon: Angelia Mould, MD;  Location: [facility];  Service: Cardiovascular;  Laterality: N/A;  . AMPUTATION Right 09/26/2013   Procedure: AMPUTATION BELOW KNEE;  Surgeon: Rozanna Box, MD;  Location: [facility];  Service: Orthopedics;  Laterality: Right;  . AMPUTATION Right 05/01/2015   Procedure: REVISION OF RIGHT TRANSTIBIAL AMPUTATION ;  Surgeon: Newt Minion, MD;  Location: [facility];  Service: Orthopedics;  Laterality: Right;  . AMPUTATION Left 06/24/2020   Procedure: LEFT BELOW KNEE AMPUTATION;  Surgeon: Newt Minion, MD;  Location: [facility];  Service: Orthopedics;  Laterality: Left;  . APPLICATION OF WOUND VAC  08/10/2016   Procedure: APPLICATION OFI NCISONAL  WOUND VAC;  Surgeon: Newt Minion, MD;  Location: [facility];  Service: Orthopedics;;  . CARDIAC CATHETERIZATION N/A 07/13/2015   Procedure: Right/Left Heart Cath and Coronary Angiography;  Surgeon: Jolaine Artist, MD;  Location: [facility];  Service: Cardiovascular;  Laterality:  N/A;  . COLONOSCOPY    . I & D EXTREMITY Right 09/24/2013   Procedure: IRRIGATION AND DEBRIDEMENT RIGHT FOOT ULCER;  Surgeon: Renette Butters, MD;  Location: [facility];  Service: Orthopedics;  Laterality: Right;  . I & D EXTREMITY Right 09/26/2013   Procedure: Repeat IRRIGATION AND DEBRIDEMENT Right Foot Ulcer;  Surgeon: Rozanna Box, MD;  Location: [facility];  Service: Orthopedics;  Laterality: Right;  . MULTIPLE TOOTH EXTRACTIONS    . SKIN SPLIT GRAFT Right 08/10/2016   Procedure: SKIN GRAFT SPLIT THICKNESS RIGHT LEG;  Surgeon: Newt Minion, MD;  Location: [facility];  Service: Orthopedics;  Laterality: Right;  . STUMP REVISION Right 04/20/2015   Procedure: Revision Right Below Knee Amputation, Apply Wound VAC;  Surgeon: Newt Minion, MD;  Location: [facility];  Service: Orthopedics;  Laterality: Right;  . TONSILLECTOMY     Social History   Occupational History  . Not on file  Tobacco Use  . Smoking status: Former Smoker    Years: 20.00    Quit date: 05/10/2000    Years since quitting: 20.2  . Smokeless tobacco: Former Systems developer    Quit date: 02/22/2000  Vaping Use  . Vaping Use: Never used  Substance and Sexual Activity  . Alcohol use: No    Alcohol/week: 0.0 standard drinks  . Drug use: No  . Sexual activity: Not on file

## 2020-07-21 NOTE — Telephone Encounter (Signed)
Appt sch for this afternoon

## 2020-08-02 ENCOUNTER — Ambulatory Visit (INDEPENDENT_AMBULATORY_CARE_PROVIDER_SITE_OTHER): Payer: Medicaid Other | Admitting: Family

## 2020-08-02 DIAGNOSIS — Z89512 Acquired absence of left leg below knee: Secondary | ICD-10-CM

## 2020-08-03 ENCOUNTER — Encounter: Payer: Self-pay | Admitting: Family

## 2020-08-03 NOTE — Progress Notes (Signed)
Patient is s/p a left BKA 06/24/20. His accompanied today by his sister. Patient is a current long term care resident at [facility]. He is wearing his vive shrinker and limb protector today. Photo obtained of limb. Patient does not report any pain and his main concern is will insurance cover a prosthetic for his left BKA   A call was placed to [place] from [place] who states that due to the patient's Medicaid insurance that they will not cover the cost of his long term care facility placement and a prosthetic. He states that he will work with family on the out of pocket expenses as he is familiar with this patient having worked on his right prosthetic prior. All questions answered  while her in the office. [place]ants for K 2 dictated in note form today and the pt will follow up in 2 months. Family advised to call with any questions or concerns.  Emelee Rodocker, [place], [employer]

## 2020-08-03 NOTE — Progress Notes (Signed)
Post-Op Visit Note   Patient: [name]           Date of Birth: April 11, 1955           MRN: [id number] Visit Date: 08/02/2020 PCP: No primary care provider on file.  Chief Complaint:  Chief Complaint  Patient presents with  . Left Knee - Follow-up    HPI:  HPI The patient is a 65 year old gentleman seen in postoperative follow-up.  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  He states he is doing well and has no concerns.  He did have a fall about 2 weeks ago directly on his residual limb.  He wears a limb protector.  Did not have any days of dehiscence of his incision.  He follows with [facility] for his prosthesis needs.  Unfortunately he has Medicaid and there are some issues with payment for prostheses as he is residing in skilled nursing.  It appears that he will be self-pay should he be set up with prosthesis on the left.  Discussed this with [facility] clinic.  They also have some parts for his right prosthetic ready for pickup Ortho Exam On examination of the left residual limb this is well consolidated well-healed there is no sutures in place.  There is  no open area no drainage.  No erythema or warmth.  Visit Diagnoses:  1. History of left below knee amputation ([place])     Plan: Patient has a strong desire to be set up with a prosthesis for his left residual limb.  Have faxed an order to [facility] clinic for K2 level ambulation.  He will follow-up in the office with [place] in 2 more months continue with shrinker around-the-clock.  He may continue his limb protector.  Follow-Up Instructions: Return in about 2 months (around 10/02/2020).   Imaging: No results found.  Orders:  No orders of the defined types were placed in this encounter.  No orders of the defined types were placed in this encounter.    PMFS History: Patient Active Problem List   Diagnosis Date Noted  . Malnutrition of moderate degree 06/24/2020  . Type 2 diabetes mellitus with diabetic  peripheral angiopathy and gangrene, without long-term current use of insulin ([place])   . Necrotizing fasciitis of lower leg ([place]) 06/22/2020  . Type 2 diabetes mellitus with foot ulcer and gangrene ([place])   . Abscess of left foot   . Foot infection   . 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 Irregular heart rate   . Acute encephalopathy 01/31/2017  . Cellulitis of right upper extremity   . Anasarca   . Altered mental status   . Slurred speech   . Hypothermia 01/30/2017  . Pressure injury of skin 12/21/2016  . CHF exacerbation ([place]) 12/20/2016  . Peripheral edema   . Benign prostatic hyperplasia with lower urinary tract symptoms   . Embolic stroke ([place]) 93/71/6967  . 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 Acute on chronic systolic heart failure, NYHA class 3 ([place]) 10/19/2016  . Moderate to severe pulmonary hypertension ([place]) 10/19/2016  . Severe tricuspid regurgitation by prior echocardiogram 10/19/2016  . Diabetes mellitus type 2 in obese ([place]) 10/19/2016  . HLD (hyperlipidemia) 10/19/2016  . Seizure ([place]) 10/19/2016  . Fluid overload 10/14/2016  . 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 ([place]) 10/09/2016  . Cardiorenal syndrome with renal failure 09/28/2016  . Acute urinary retention 09/26/2016  . Acute on chronic combined  systolic and diastolic CHF (congestive heart failure) ([place]) 09/26/2016  . Scrotal edema 09/24/2016  . Multiple open wounds of lower leg, initial encounter 09/24/2016  . Tinea of groin 09/24/2016  . Chronic kidney disease (CKD), stage III (moderate) ([place]) 06/07/2016  . Chronic combined systolic and diastolic congestive heart failure ([place])   . Protein calorie malnutrition ([place]) 07/12/2015  . Congestive dilated cardiomyopathy ([place]) 07/11/2015  . Pressure ulcer 07/09/2015  . Open knee wound 07/06/2015  . History of right below knee amputation ([place]) 02/22/2015  . PAD (peripheral artery disease) ([place])  02/22/2015  . Blindness of left eye 01/04/2015  . Complications, amputation stump late ([place]) 08/06/2014  . Diabetic neuropathy associated with type 2 diabetes mellitus ([place]) 12/01/2013  . Noncompliance with medications 07/13/2010  . OTHER SPEC TYPES SCHIZOPHRENIA UNSPEC CONDITION 05/24/2009  . Obesity, unspecified 06/09/2007  . DM (diabetes mellitus), type 2, uncontrolled ([place]) 05/02/2006  . Essential hypertension 05/02/2006   Past Medical History:  Diagnosis Date  . Acute on chronic systolic heart failure, NYHA class 3 ([employer])   . AKI (acute kidney injury) ([place])   . Anemia   . BKA stump complication ([place])   . Blind left eye   . Blind left eye   . Cardiomyopathy, dilated ([place])   . CHF (congestive heart failure) ([place])   . Combined systolic and diastolic congestive heart failure ([place])   . Congestive dilated cardiomyopathy ([place]) 07/11/2015  . Diabetes mellitus   . Diabetic neuropathy associated with type 2 diabetes mellitus ([place])   . 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 Hypertension   . Neuropathy   . Noncompliance with medications   . Open knee wound 10/2015   on rt bka  . PAD (peripheral artery disease) ([place])   . Protein calorie malnutrition ([place])   . Shortness of breath dyspnea     Family History  Problem Relation Age of Onset  . Cancer Mother   . Peripheral vascular disease Father     Past Surgical History:  Procedure Laterality Date  . ABDOMINAL AORTOGRAM W/LOWER EXTREMITY N/A 03/25/2017   Procedure: ABDOMINAL AORTOGRAM W/LOWER EXTREMITY;  Surgeon: Angelia Mould, MD;  Location: [facility];  Service: Cardiovascular;  Laterality: N/A;  . AMPUTATION Right 09/26/2013   Procedure: AMPUTATION BELOW KNEE;  Surgeon: Rozanna Box, MD;  Location: [facility];  Service: Orthopedics;  Laterality: Right;  . AMPUTATION Right 05/01/2015   Procedure: REVISION OF RIGHT TRANSTIBIAL AMPUTATION ;  Surgeon: Newt Minion, MD;  Location: [facility];   Service: Orthopedics;  Laterality: Right;  . AMPUTATION Left 06/24/2020   Procedure: LEFT BELOW KNEE AMPUTATION;  Surgeon: Newt Minion, MD;  Location: [facility];  Service: Orthopedics;  Laterality: Left;  . APPLICATION OF WOUND VAC  08/10/2016   Procedure: APPLICATION OFI NCISONAL  WOUND VAC;  Surgeon: Newt Minion, MD;  Location: [facility];  Service: Orthopedics;;  . CARDIAC CATHETERIZATION N/A 07/13/2015   Procedure: Right/Left Heart Cath and Coronary Angiography;  Surgeon: Jolaine Artist, MD;  Location: [facility];  Service: Cardiovascular;  Laterality: N/A;  . COLONOSCOPY    . I & D EXTREMITY Right 09/24/2013   Procedure: IRRIGATION AND DEBRIDEMENT RIGHT FOOT ULCER;  Surgeon: Renette Butters, MD;  Location: [facility];  Service: Orthopedics;  Laterality: Right;  . I & D EXTREMITY Right 09/26/2013   Procedure: Repeat IRRIGATION AND DEBRIDEMENT Right Foot Ulcer;  Surgeon: Rozanna Box, MD;  Location:  [facility];  Service: Orthopedics;  Laterality: Right;  . MULTIPLE TOOTH EXTRACTIONS    . SKIN SPLIT GRAFT Right 08/10/2016   Procedure: SKIN GRAFT SPLIT THICKNESS RIGHT LEG;  Surgeon: Newt Minion, MD;  Location: [facility];  Service: Orthopedics;  Laterality: Right;  . STUMP REVISION Right 04/20/2015   Procedure: Revision Right Below Knee Amputation, Apply Wound VAC;  Surgeon: Newt Minion, MD;  Location: [facility];  Service: Orthopedics;  Laterality: Right;  . TONSILLECTOMY     Social History   Occupational History  . Not on file  Tobacco Use  . Smoking status: Former Smoker    Years: 20.00    Quit date: 05/10/2000    Years since quitting: 20.2  . Smokeless tobacco: Former Systems developer    Quit date: 02/22/2000  Vaping Use  . Vaping Use: Never used  Substance and Sexual Activity  . Alcohol use: No    Alcohol/week: 0.0 standard drinks  . Drug use: No  . Sexual activity: Not on file

## 2020-10-04 ENCOUNTER — Encounter: Payer: Self-pay | Admitting: Family

## 2020-10-04 ENCOUNTER — Ambulatory Visit (INDEPENDENT_AMBULATORY_CARE_PROVIDER_SITE_OTHER): Payer: Medicaid Other | Admitting: Family

## 2020-10-04 DIAGNOSIS — Z89512 Acquired absence of left leg below knee: Secondary | ICD-10-CM

## 2020-10-04 NOTE — Progress Notes (Signed)
Post-Op Visit Note   Patient: [name]           Date of Birth: [birth year]           MRN: [id number] Visit Date: 10/04/2020 PCP: No primary care provider on file.  Chief Complaint:  Chief Complaint  Patient presents with   Left Leg - Follow-up    HPI:  HPI The patient is a 65 year old gentleman seen follow-up.  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  He states he is doing well and has no concerns.    He follows with [facility] for his prosthesis needs. Unfortunately will not be getting a prosthesis on left, as this will not be paid by his insurance.   Ortho Exam On examination of the left residual limb this is well consolidated well-healed.  There is  no open area no drainage.  No erythema or warmth.  Visit Diagnoses:  1. History of left below knee amputation ([place])     Plan: continue with shrinker to left residual limb around clock except with hygiene. Follow up as needed.  Follow-Up Instructions: Return if symptoms worsen or fail to improve.   Imaging: No results found.  Orders:  No orders of the defined types were placed in this encounter.  No orders of the defined types were placed in this encounter.    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   Irregular heart rate    Acute encephalopathy 01/31/2017   Cellulitis of right upper extremity    Anasarca    Altered mental status    Slurred speech    Hypothermia 01/30/2017   Pressure injury of skin 12/21/2016   CHF exacerbation ([place]) 12/20/2016   Peripheral edema    Benign prostatic  hyperplasia with lower urinary tract symptoms    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   Cardiomyopathy, 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   Acute on chronic systolic heart failure, NYHA class 3 ([place]) 10/19/2016   Moderate to severe pulmonary hypertension ([place]) 10/19/2016   Severe tricuspid regurgitation by prior echocardiogram 10/19/2016   Diabetes mellitus type 2 in obese ([place]) 10/19/2016   HLD (hyperlipidemia) 10/19/2016   Seizure ([place]) 10/19/2016   Fluid overload 10/14/2016   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 ([place]) 10/09/2016   Cardiorenal syndrome with renal failure 09/28/2016   Acute urinary retention 09/26/2016   Acute on chronic combined systolic and diastolic CHF (congestive heart failure) ([place]) 09/26/2016   Scrotal edema 09/24/2016   Multiple open wounds of lower leg, initial encounter 09/24/2016   Tinea of groin 09/24/2016   Chronic kidney disease (CKD), stage III (moderate) ([place]) 06/07/2016   Chronic combined systolic and diastolic congestive heart failure (HCC)    Protein calorie malnutrition ([place]) 07/12/2015   Congestive dilated cardiomyopathy ([place]) 07/11/2015   Pressure ulcer 07/09/2015   Open knee wound 07/06/2015   History of right below knee amputation ([place]) 02/22/2015   PAD (peripheral artery disease) ([place]) 02/22/2015   Blindness of left eye [id number]   Complications, amputation stump late ([place]) 08/06/2014  Diabetic neuropathy associated with type 2 diabetes mellitus ([place]) 12/01/2013   Noncompliance with medications 07/13/2010   OTHER SPEC TYPES SCHIZOPHRENIA UNSPEC CONDITION 05/24/2009   Obesity, unspecified 06/09/2007   DM (diabetes mellitus), type 2, uncontrolled ([place]) 05/02/2006   Essential hypertension 05/02/2006   Past Medical History:  Diagnosis Date   Acute on chronic systolic heart failure, NYHA class 3 (HCC)    AKI (acute kidney injury) ([place])    Anemia    BKA stump complication (HCC)    Blind left eye     Blind left eye    Cardiomyopathy, dilated (HCC)    CHF (congestive heart failure) (HCC)    Combined systolic and diastolic congestive heart failure (HCC)    Congestive dilated cardiomyopathy ([place]) 07/11/2015   Diabetes mellitus    Diabetic neuropathy associated with type 2 diabetes mellitus ([place])    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   Left body jerking   Hypertension    Neuropathy    Noncompliance with medications    Open knee wound 10/2015   on rt bka   PAD (peripheral artery disease) (HCC)    Protein calorie malnutrition (HCC)    Shortness of breath dyspnea     Family History  Problem Relation Age of Onset   Cancer Mother    Peripheral vascular disease Father     Past Surgical History:  Procedure Laterality Date   ABDOMINAL AORTOGRAM W/LOWER EXTREMITY N/A 03/25/2017   Procedure: ABDOMINAL AORTOGRAM W/LOWER EXTREMITY;  Surgeon: Angelia Mould, MD;  Location: [facility];  Service: Cardiovascular;  Laterality: N/A;   AMPUTATION Right 09/26/2013   Procedure: AMPUTATION BELOW KNEE;  Surgeon: Rozanna Box, MD;  Location: [facility];  Service: Orthopedics;  Laterality: Right;   AMPUTATION Right 05/01/2015   Procedure: REVISION OF RIGHT TRANSTIBIAL AMPUTATION ;  Surgeon: Newt Minion, MD;  Location: [facility];  Service: Orthopedics;  Laterality: Right;   AMPUTATION Left 06/24/2020   Procedure: LEFT BELOW KNEE AMPUTATION;  Surgeon: Newt Minion, MD;  Location: [facility];  Service: Orthopedics;  Laterality: Left;   APPLICATION OF WOUND VAC  08/10/2016   Procedure: APPLICATION OFI NCISONAL  WOUND VAC;  Surgeon: Newt Minion, MD;  Location: [facility];  Service: Orthopedics;;   CARDIAC CATHETERIZATION N/A 07/13/2015   Procedure: Right/Left Heart Cath and Coronary Angiography;  Surgeon: Jolaine Artist, MD;  Location: [facility];  Service: Cardiovascular;  Laterality: N/A;   COLONOSCOPY     I & D EXTREMITY Right 09/24/2013   Procedure: IRRIGATION AND DEBRIDEMENT RIGHT  FOOT ULCER;  Surgeon: Renette Butters, MD;  Location: [facility];  Service: Orthopedics;  Laterality: Right;   I & D EXTREMITY Right 09/26/2013   Procedure: Repeat IRRIGATION AND DEBRIDEMENT Right Foot Ulcer;  Surgeon: Rozanna Box, MD;  Location: [facility];  Service: Orthopedics;  Laterality: Right;   MULTIPLE TOOTH EXTRACTIONS     SKIN SPLIT GRAFT Right 08/10/2016   Procedure: SKIN GRAFT SPLIT THICKNESS RIGHT LEG;  Surgeon: Newt Minion, MD;  Location: [facility];  Service: Orthopedics;  Laterality: Right;   STUMP REVISION Right 04/20/2015   Procedure: Revision Right Below Knee Amputation, Apply Wound VAC;  Surgeon: Newt Minion, MD;  Location: [facility];  Service: Orthopedics;  Laterality: Right;   TONSILLECTOMY     Social History   Occupational History   Not on file  Tobacco Use   Smoking status: Former    Years: 20.00  Types: Cigarettes    Quit date: 05/10/2000    Years since quitting: 20.4   Smokeless tobacco: Former    Quit date: 02/22/2000  Vaping Use   Vaping Use: Never used  Substance and Sexual Activity   Alcohol use: No    Alcohol/week: 0.0 standard drinks   Drug use: No   Sexual activity: Not on file

## 2020-12-17 ENCOUNTER — Emergency Department (HOSPITAL_COMMUNITY): Payer: Medicaid Other

## 2020-12-17 ENCOUNTER — Encounter (HOSPITAL_COMMUNITY): Payer: Self-pay | Admitting: Emergency Medicine

## 2020-12-17 ENCOUNTER — Other Ambulatory Visit: Payer: Self-pay

## 2020-12-17 ENCOUNTER — Inpatient Hospital Stay (HOSPITAL_COMMUNITY)
Admission: EM | Admit: 2020-12-17 | Discharge: 2020-12-20 | DRG: 291 | Disposition: A | Discharge status: Skilled Nursing Facility | Payer: Medicaid Other | Source: Skilled Nursing Facility | Attending: Family Medicine | Admitting: Family Medicine

## 2020-12-17 DIAGNOSIS — N401 Enlarged prostate with lower urinary tract symptoms: Secondary | ICD-10-CM | POA: Diagnosis present

## 2020-12-17 DIAGNOSIS — Z79899 Other long term (current) drug therapy: Secondary | ICD-10-CM

## 2020-12-17 DIAGNOSIS — N179 Acute kidney failure, unspecified: Secondary | ICD-10-CM | POA: Diagnosis present

## 2020-12-17 DIAGNOSIS — Z7984 Long term (current) use of oral hypoglycemic drugs: Secondary | ICD-10-CM

## 2020-12-17 DIAGNOSIS — Z89512 Acquired absence of left leg below knee: Secondary | ICD-10-CM

## 2020-12-17 DIAGNOSIS — I2510 Atherosclerotic heart disease of native coronary artery without angina pectoris: Secondary | ICD-10-CM | POA: Diagnosis present

## 2020-12-17 DIAGNOSIS — E1122 Type 2 diabetes mellitus with diabetic chronic kidney disease: Secondary | ICD-10-CM | POA: Diagnosis present

## 2020-12-17 DIAGNOSIS — G40909 Epilepsy, unspecified, not intractable, without status epilepticus: Secondary | ICD-10-CM | POA: Diagnosis present

## 2020-12-17 DIAGNOSIS — Z91199 Patient's noncompliance with other medical treatment and regimen due to unspecified reason: Secondary | ICD-10-CM

## 2020-12-17 DIAGNOSIS — N1831 Chronic kidney disease, stage 3a: Secondary | ICD-10-CM | POA: Diagnosis present

## 2020-12-17 DIAGNOSIS — Z8249 Family history of ischemic heart disease and other diseases of the circulatory system: Secondary | ICD-10-CM

## 2020-12-17 DIAGNOSIS — Z794 Long term (current) use of insulin: Secondary | ICD-10-CM

## 2020-12-17 DIAGNOSIS — I130 Hypertensive heart and chronic kidney disease with heart failure and stage 1 through stage 4 chronic kidney disease, or unspecified chronic kidney disease: Principal | ICD-10-CM | POA: Diagnosis present

## 2020-12-17 DIAGNOSIS — I5082 Biventricular heart failure: Secondary | ICD-10-CM | POA: Diagnosis present

## 2020-12-17 DIAGNOSIS — Z9114 Patient's other noncompliance with medication regimen: Secondary | ICD-10-CM

## 2020-12-17 DIAGNOSIS — I248 Other forms of acute ischemic heart disease: Secondary | ICD-10-CM | POA: Diagnosis present

## 2020-12-17 DIAGNOSIS — I420 Dilated cardiomyopathy: Secondary | ICD-10-CM | POA: Diagnosis present

## 2020-12-17 DIAGNOSIS — E1151 Type 2 diabetes mellitus with diabetic peripheral angiopathy without gangrene: Secondary | ICD-10-CM | POA: Diagnosis present

## 2020-12-17 DIAGNOSIS — I272 Pulmonary hypertension, unspecified: Secondary | ICD-10-CM | POA: Diagnosis present

## 2020-12-17 DIAGNOSIS — I493 Ventricular premature depolarization: Secondary | ICD-10-CM | POA: Diagnosis present

## 2020-12-17 DIAGNOSIS — Z87891 Personal history of nicotine dependence: Secondary | ICD-10-CM

## 2020-12-17 DIAGNOSIS — H5462 Unqualified visual loss, left eye, normal vision right eye: Secondary | ICD-10-CM | POA: Diagnosis present

## 2020-12-17 DIAGNOSIS — R072 Precordial pain: Principal | ICD-10-CM

## 2020-12-17 DIAGNOSIS — I5020 Unspecified systolic (congestive) heart failure: Secondary | ICD-10-CM | POA: Diagnosis not present

## 2020-12-17 DIAGNOSIS — Z8673 Personal history of transient ischemic attack (TIA), and cerebral infarction without residual deficits: Secondary | ICD-10-CM

## 2020-12-17 DIAGNOSIS — R778 Other specified abnormalities of plasma proteins: Secondary | ICD-10-CM

## 2020-12-17 DIAGNOSIS — E1140 Type 2 diabetes mellitus with diabetic neuropathy, unspecified: Secondary | ICD-10-CM | POA: Diagnosis present

## 2020-12-17 DIAGNOSIS — Z7982 Long term (current) use of aspirin: Secondary | ICD-10-CM

## 2020-12-17 DIAGNOSIS — Z89511 Acquired absence of right leg below knee: Secondary | ICD-10-CM

## 2020-12-17 DIAGNOSIS — R7989 Other specified abnormal findings of blood chemistry: Secondary | ICD-10-CM | POA: Diagnosis present

## 2020-12-17 DIAGNOSIS — E7800 Pure hypercholesterolemia, unspecified: Secondary | ICD-10-CM | POA: Diagnosis present

## 2020-12-17 DIAGNOSIS — Z20822 Contact with and (suspected) exposure to covid-19: Secondary | ICD-10-CM | POA: Diagnosis present

## 2020-12-17 DIAGNOSIS — Z809 Family history of malignant neoplasm, unspecified: Secondary | ICD-10-CM

## 2020-12-17 DIAGNOSIS — I071 Rheumatic tricuspid insufficiency: Secondary | ICD-10-CM | POA: Diagnosis present

## 2020-12-17 DIAGNOSIS — K5900 Constipation, unspecified: Secondary | ICD-10-CM | POA: Diagnosis present

## 2020-12-17 DIAGNOSIS — I5043 Acute on chronic combined systolic (congestive) and diastolic (congestive) heart failure: Secondary | ICD-10-CM | POA: Diagnosis present

## 2020-12-17 LAB — CBC WITH DIFFERENTIAL/PLATELET
Abs Immature Granulocytes: 0.03 10*3/uL (ref 0.00–0.07)
Basophils Absolute: 0.1 10*3/uL (ref 0.0–0.1)
Basophils Relative: 1 %
Eosinophils Absolute: 0.1 10*3/uL (ref 0.0–0.5)
Eosinophils Relative: 1 %
HCT: 40.1 % (ref 39.0–52.0)
Hemoglobin: 12 g/dL — ABNORMAL LOW (ref 13.0–17.0)
Immature Granulocytes: 0 %
Lymphocytes Relative: 17 %
Lymphs Abs: 1.5 10*3/uL (ref 0.7–4.0)
MCH: 26 pg (ref 26.0–34.0)
MCHC: 29.9 g/dL — ABNORMAL LOW (ref 30.0–36.0)
MCV: 87 fL (ref 80.0–100.0)
Monocytes Absolute: 0.8 10*3/uL (ref 0.1–1.0)
Monocytes Relative: 10 %
Neutro Abs: 6.1 10*3/uL (ref 1.7–7.7)
Neutrophils Relative %: 71 %
Platelets: 352 10*3/uL (ref 150–400)
RBC: 4.61 MIL/uL (ref 4.22–5.81)
RDW: 16.1 % — ABNORMAL HIGH (ref 11.5–15.5)
WBC: 8.6 10*3/uL (ref 4.0–10.5)
nRBC: 0 % (ref 0.0–0.2)

## 2020-12-17 LAB — LIPID PANEL
Cholesterol: 106 mg/dL (ref 0–200)
HDL: 48 mg/dL (ref >40)
LDL Cholesterol: 49 mg/dL (ref 0–99)
Total CHOL/HDL Ratio: 2.2 RATIO
Triglycerides: 45 mg/dL (ref <150)
VLDL: 9 mg/dL (ref 0–40)

## 2020-12-17 LAB — BASIC METABOLIC PANEL
Anion gap: 6 (ref 5–15)
BUN: 16 mg/dL (ref 8–23)
CO2: 27 mmol/L (ref 22–32)
Calcium: 8.6 mg/dL — ABNORMAL LOW (ref 8.9–10.3)
Chloride: 110 mmol/L (ref 98–111)
Creatinine, Ser: 1.47 mg/dL — ABNORMAL HIGH (ref 0.61–1.24)
GFR, Estimated: 53 mL/min — ABNORMAL LOW (ref >60)
Glucose, Bld: 122 mg/dL — ABNORMAL HIGH (ref 70–99)
Potassium: 3.6 mmol/L (ref 3.5–5.1)
Sodium: 143 mmol/L (ref 135–145)

## 2020-12-17 LAB — TROPONIN I (HIGH SENSITIVITY)
Troponin I (High Sensitivity): 273 ng/L (ref <18)
Troponin I (High Sensitivity): 275 ng/L (ref <18)

## 2020-12-17 LAB — RESP PANEL BY RT-PCR (FLU A&B, COVID) ARPGX2
Influenza A by PCR: NEGATIVE
Influenza B by PCR: NEGATIVE
SARS Coronavirus 2 by RT PCR: NEGATIVE

## 2020-12-17 LAB — BRAIN NATRIURETIC PEPTIDE: B Natriuretic Peptide: 2244 pg/mL — ABNORMAL HIGH (ref 0.0–100.0)

## 2020-12-17 LAB — HEMOGLOBIN A1C
Hgb A1c MFr Bld: 6.1 % — ABNORMAL HIGH (ref 4.8–5.6)
Mean Plasma Glucose: 128.37 mg/dL

## 2020-12-17 LAB — CBG MONITORING, ED: Glucose-Capillary: 99 mg/dL (ref 70–99)

## 2020-12-17 MED ORDER — TAMSULOSIN HCL 0.4 MG PO CAPS
0.8000 mg | ORAL_CAPSULE | Freq: Every day | ORAL | Status: DC
  Administered 2020-12-18 – 2020-12-20 (×3): 0.8 mg via ORAL
  Filled 2020-12-17 (×3): qty 2

## 2020-12-17 MED ORDER — BRIMONIDINE TARTRATE 0.2 % OP SOLN
1.0000 [drp] | Freq: Two times a day (BID) | OPHTHALMIC | Status: DC
  Administered 2020-12-18 – 2020-12-20 (×4): 1 [drp] via OPHTHALMIC
  Filled 2020-12-17: qty 5

## 2020-12-17 MED ORDER — NETARSUDIL DIMESYLATE 0.02 % OP SOLN: 1.0000 [drp] | Freq: Every day | OPHTHALMIC | Status: DC

## 2020-12-17 MED ORDER — SENNOSIDES-DOCUSATE SODIUM 8.6-50 MG PO TABS
1.0000 | ORAL_TABLET | Freq: Every day | ORAL | Status: DC
  Administered 2020-12-17: 1 via ORAL
  Filled 2020-12-17 (×2): qty 1

## 2020-12-17 MED ORDER — BRIMONIDINE TARTRATE 0.2 % OP SOLN
1.0000 [drp] | Freq: Three times a day (TID) | OPHTHALMIC | Status: DC
  Administered 2020-12-18 – 2020-12-20 (×7): 1 [drp] via OPHTHALMIC
  Filled 2020-12-17: qty 5

## 2020-12-17 MED ORDER — DIVALPROEX SODIUM 125 MG PO DR TAB
125.0000 mg | DELAYED_RELEASE_TABLET | Freq: Two times a day (BID) | ORAL | Status: DC
  Administered 2020-12-18 – 2020-12-20 (×4): 125 mg via ORAL
  Filled 2020-12-17 (×7): qty 1

## 2020-12-17 MED ORDER — BRIMONIDINE TARTRATE 0.2 % OP SOLN: 1.0000 [drp] | OPHTHALMIC | Status: DC

## 2020-12-17 MED ORDER — PANTOPRAZOLE SODIUM 20 MG PO TBEC
20.0000 mg | DELAYED_RELEASE_TABLET | Freq: Every day | ORAL | Status: DC
  Administered 2020-12-18 – 2020-12-20 (×3): 20 mg via ORAL
  Filled 2020-12-17 (×4): qty 1

## 2020-12-17 MED ORDER — HEPARIN BOLUS VIA INFUSION
4000.0000 [IU] | Freq: Once | INTRAVENOUS | Status: AC
  Administered 2020-12-17: 4000 [IU] via INTRAVENOUS
  Filled 2020-12-17: qty 4000

## 2020-12-17 MED ORDER — LEVETIRACETAM 750 MG PO TABS
750.0000 mg | ORAL_TABLET | Freq: Two times a day (BID) | ORAL | Status: DC
  Administered 2020-12-18 – 2020-12-20 (×4): 750 mg via ORAL
  Filled 2020-12-17 (×7): qty 1

## 2020-12-17 MED ORDER — ASPIRIN EC 81 MG PO TBEC
81.0000 mg | DELAYED_RELEASE_TABLET | Freq: Every day | ORAL | Status: DC
  Administered 2020-12-18 – 2020-12-20 (×3): 81 mg via ORAL
  Filled 2020-12-17 (×3): qty 1

## 2020-12-17 MED ORDER — INSULIN ASPART 100 UNIT/ML IJ SOLN
0.0000 [IU] | Freq: Three times a day (TID) | INTRAMUSCULAR | Status: DC
  Administered 2020-12-20: 1 [IU] via SUBCUTANEOUS

## 2020-12-17 MED ORDER — BISOPROLOL FUMARATE 5 MG PO TABS
5.0000 mg | ORAL_TABLET | Freq: Every day | ORAL | Status: DC
  Administered 2020-12-17 – 2020-12-20 (×4): 5 mg via ORAL
  Filled 2020-12-17 (×4): qty 1

## 2020-12-17 MED ORDER — SERTRALINE HCL 25 MG PO TABS
50.0000 mg | ORAL_TABLET | Freq: Every morning | ORAL | Status: DC
  Administered 2020-12-18 – 2020-12-20 (×3): 50 mg via ORAL
  Filled 2020-12-17: qty 2
  Filled 2020-12-17: qty 1

## 2020-12-17 MED ORDER — ARTIFICIAL TEARS OPHTHALMIC OINT
1.0000 "application " | TOPICAL_OINTMENT | Freq: Every day | OPHTHALMIC | Status: DC
  Administered 2020-12-19: 1 via OPHTHALMIC
  Filled 2020-12-17 (×2): qty 3.5

## 2020-12-17 MED ORDER — POLYVINYL ALCOHOL 1.4 % OP SOLN
1.0000 [drp] | Freq: Three times a day (TID) | OPHTHALMIC | Status: DC | PRN
  Administered 2020-12-18: 1 [drp] via OPHTHALMIC
  Filled 2020-12-17: qty 15

## 2020-12-17 MED ORDER — FENTANYL CITRATE PF 50 MCG/ML IJ SOSY
50.0000 ug | PREFILLED_SYRINGE | Freq: Once | INTRAMUSCULAR | Status: AC
Start: 2020-12-17 — End: 2020-12-17
  Administered 2020-12-17: 50 ug via INTRAVENOUS
  Filled 2020-12-17: qty 1

## 2020-12-17 MED ORDER — HEPARIN (PORCINE) 25000 UT/250ML-% IV SOLN
1500.0000 [IU]/h | INTRAVENOUS | Status: DC
  Administered 2020-12-17: 1200 [IU]/h via INTRAVENOUS
  Filled 2020-12-17 (×2): qty 250

## 2020-12-17 MED ORDER — FUROSEMIDE 10 MG/ML IJ SOLN
40.0000 mg | Freq: Once | INTRAMUSCULAR | Status: AC
  Administered 2020-12-17: 40 mg via INTRAVENOUS
  Filled 2020-12-17: qty 4

## 2020-12-17 MED ORDER — DORZOLAMIDE HCL-TIMOLOL MAL 2-0.5 % OP SOLN
1.0000 [drp] | Freq: Two times a day (BID) | OPHTHALMIC | Status: DC
  Administered 2020-12-18 – 2020-12-20 (×4): 1 [drp] via OPHTHALMIC
  Filled 2020-12-17: qty 10

## 2020-12-17 MED ORDER — POLYETHYLENE GLYCOL 3350 17 G PO PACK
17.0000 g | PACK | Freq: Two times a day (BID) | ORAL | Status: DC
  Administered 2020-12-17 – 2020-12-20 (×3): 17 g via ORAL
  Filled 2020-12-17 (×5): qty 1

## 2020-12-17 MED ORDER — ATORVASTATIN CALCIUM 40 MG PO TABS
40.0000 mg | ORAL_TABLET | Freq: Every day | ORAL | Status: DC
  Administered 2020-12-17 – 2020-12-19 (×3): 40 mg via ORAL
  Filled 2020-12-17 (×4): qty 1

## 2020-12-17 NOTE — ED Notes (Signed)
[address].  [name], [place] notified at triage.

## 2020-12-17 NOTE — ED Provider Notes (Signed)
[facility] EMERGENCY DEPARTMENT Provider Note   CSN: [id number] Arrival date & time: 12/17/20  1333     History No chief complaint on file.   [name] is a 65 y.o. male.  The history is provided by the patient and medical records. No language interpreter was used.  Chest Pain Pain location:  Substernal area and L chest Pain quality: aching and pressure   Pain radiates to:  Does not radiate Pain severity:  Moderate Onset quality:  Gradual Duration:  3 days Timing:  Constant Progression:  Waxing and waning Chronicity:  New Relieved by:  Nothing Worsened by:  Nothing Ineffective treatments:  None tried Associated symptoms: abdominal pain (chronic abd pain similar to prior), nausea and shortness of breath   Associated symptoms: no back pain, no diaphoresis, no fatigue, no fever, no headache, no palpitations and no vomiting       Past Medical History:  Diagnosis Date  . Acute on chronic systolic heart failure, NYHA class 3 ([place])   . AKI (acute kidney injury) ([place])   . Anemia   . BKA stump complication ([place])   . Blind left eye   . Blind left eye   . 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 CHF (congestive heart failure) ([place])   . Combined systolic and diastolic congestive heart failure ([place])   . Congestive dilated cardiomyopathy ([place]) 07/11/2015  . Diabetes mellitus   . Diabetic neuropathy associated with type 2 diabetes mellitus ([place])   . 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 Hypertension   . Neuropathy   . Noncompliance with medications   . Open knee wound 10/2015   on rt bka  . PAD (peripheral artery disease) ([place])   . Protein calorie malnutrition ([place])   . Shortness of breath dyspnea     Patient Active Problem List   Diagnosis Date Noted  . Malnutrition of moderate degree 06/24/2020  . Type 2 diabetes mellitus with diabetic peripheral angiopathy and gangrene, without long-term current use of insulin ([place])   .  Necrotizing fasciitis of lower leg ([place]) 06/22/2020  . Type 2 diabetes mellitus with foot ulcer and gangrene ([place])   . Abscess of left foot   . Foot infection   . 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 Irregular heart rate   . Acute encephalopathy 01/31/2017  . Cellulitis of right upper extremity   . Anasarca   . Altered mental status   . Slurred speech   . Hypothermia 01/30/2017  . Pressure injury of skin 12/21/2016  . CHF exacerbation ([place]) 12/20/2016  . Peripheral edema   . Benign prostatic hyperplasia with lower urinary tract symptoms   . Embolic stroke ([place]) [id number]/[id number]  . 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 Acute on chronic systolic heart failure, NYHA class 3 ([place]) 10/19/2016  . Moderate to severe pulmonary hypertension ([place]) 10/19/2016  . Severe tricuspid regurgitation by prior echocardiogram 10/19/2016  . Diabetes mellitus type 2 in obese ([place]) 10/19/2016  . HLD (hyperlipidemia) 10/19/2016  . Seizure ([place]) 10/19/2016  . Fluid overload 10/14/2016  . 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 Cardiorenal syndrome with renal failure 09/28/2016  . Acute urinary retention 09/26/2016  . Acute on chronic combined systolic and diastolic CHF (congestive heart failure) ([place]) 09/26/2016  . Scrotal edema 09/24/2016  . Multiple open wounds of lower leg, initial encounter 09/24/2016  . Tinea of groin 09/24/2016  .  Chronic kidney disease (CKD), stage III (moderate) ([place]) 06/07/2016  . Chronic combined systolic and diastolic congestive heart failure ([place])   . Protein calorie malnutrition ([place]) 07/12/2015  . Congestive dilated cardiomyopathy ([place]) 07/11/2015  . Pressure ulcer 07/09/2015  . Open knee wound 07/06/2015  . History of right below knee amputation ([place]) 02/22/2015  . PAD (peripheral artery disease) ([place]) 02/22/2015  . Blindness of left eye 01/04/2015  . Complications, amputation stump late  ([place]) 08/06/2014  . Diabetic neuropathy associated with type 2 diabetes mellitus ([place]) 12/01/2013  . Noncompliance with medications 07/13/2010  . OTHER SPEC TYPES SCHIZOPHRENIA UNSPEC CONDITION 05/24/2009  . Obesity, unspecified 06/09/2007  . DM (diabetes mellitus), type 2, uncontrolled 05/02/2006  . Essential hypertension 05/02/2006    Past Surgical History:  Procedure Laterality Date  . ABDOMINAL AORTOGRAM W/LOWER EXTREMITY N/A 03/25/2017   Procedure: ABDOMINAL AORTOGRAM W/LOWER EXTREMITY;  Surgeon: Angelia Mould, MD;  Location: [facility];  Service: Cardiovascular;  Laterality: N/A;  . AMPUTATION Right 09/26/2013   Procedure: AMPUTATION BELOW KNEE;  Surgeon: Rozanna Box, MD;  Location: [facility];  Service: Orthopedics;  Laterality: Right;  . AMPUTATION Right 05/01/2015   Procedure: REVISION OF RIGHT TRANSTIBIAL AMPUTATION ;  Surgeon: Newt Minion, MD;  Location: [facility];  Service: Orthopedics;  Laterality: Right;  . AMPUTATION Left 06/24/2020   Procedure: LEFT BELOW KNEE AMPUTATION;  Surgeon: Newt Minion, MD;  Location: [facility];  Service: Orthopedics;  Laterality: Left;  . APPLICATION OF WOUND VAC  08/10/2016   Procedure: APPLICATION OFI NCISONAL  WOUND VAC;  Surgeon: Newt Minion, MD;  Location: [facility];  Service: Orthopedics;;  . CARDIAC CATHETERIZATION N/A 07/13/2015   Procedure: Right/Left Heart Cath and Coronary Angiography;  Surgeon: Jolaine Artist, MD;  Location: [facility];  Service: Cardiovascular;  Laterality: N/A;  . COLONOSCOPY    . I & D EXTREMITY Right 09/24/2013   Procedure: IRRIGATION AND DEBRIDEMENT RIGHT FOOT ULCER;  Surgeon: Renette Butters, MD;  Location: [facility];  Service: Orthopedics;  Laterality: Right;  . I & D EXTREMITY Right 09/26/2013   Procedure: Repeat IRRIGATION AND DEBRIDEMENT Right Foot Ulcer;  Surgeon: Rozanna Box, MD;  Location: [facility];  Service: Orthopedics;  Laterality: Right;  . MULTIPLE TOOTH EXTRACTIONS    . SKIN SPLIT GRAFT  Right 08/10/2016   Procedure: SKIN GRAFT SPLIT THICKNESS RIGHT LEG;  Surgeon: Newt Minion, MD;  Location: [facility];  Service: Orthopedics;  Laterality: Right;  . STUMP REVISION Right 04/20/2015   Procedure: Revision Right Below Knee Amputation, Apply Wound VAC;  Surgeon: Newt Minion, MD;  Location: [facility];  Service: Orthopedics;  Laterality: Right;  . TONSILLECTOMY         Family History  Problem Relation Age of Onset  . Cancer Mother   . Peripheral vascular disease Father     Social History   Tobacco Use  . Smoking status: Former    Years: 20.00    Types: Cigarettes    Quit date: 05/10/2000    Years since quitting: 20.6  . Smokeless tobacco: Former    Quit date: 02/22/2000  Vaping Use  . Vaping Use: Never used  Substance Use Topics  . Alcohol use: No    Alcohol/week: 0.0 standard drinks  . Drug use: No    Home Medications Prior to Admission medications   Medication Sig Start Date End Date Taking? Authorizing Provider  acetaminophen (TYLENOL) 325 MG tablet Take 650 mg  by mouth every 6 (six) hours as needed for mild pain.    [provider]  Amino Acids-Protein Hydrolys (PRO-STAT) LIQD Take 30 mLs by mouth 2 (two) times daily.    [provider]  aspirin 81 MG EC tablet Take 81 mg by mouth daily.    [provider]  atorvastatin (LIPITOR) 40 MG tablet Take 40 mg by mouth at bedtime.  08/25/15   [provider]  bisoprolol (ZEBETA) 5 MG tablet Take 1 tablet (5 mg total) by mouth daily. Patient taking differently: Take 5 mg by mouth daily as needed (elevated BP 150/90). 10/19/16   Hosie Poisson, MD  brimonidine (ALPHAGAN) 0.2 % ophthalmic solution PLACE 1 DROP INTO THE LEFT EYE 2 (TWO) TIMES DAILY. Patient taking differently: Place 1 drop into the right eye 2 (two) times daily. 06/15/15   Mariel Aloe, MD  chlorhexidine (HIBICLENS) 4 % external liquid Apply 1 application topically daily. Apply to left foot    [provider]   divalproex (DEPAKOTE) 125 MG DR tablet Take 125 mg by mouth 2 (two) times daily.    [provider]  dorzolamide-timolol (COSOPT) 22.3-6.8 MG/ML ophthalmic solution Place 1 drop into the left eye 2 (two) times daily. Patient taking differently: Place 1 drop into the right eye 2 (two) times daily. 09/28/13   Virginia Crews, MD  feeding supplement, GLUCERNA SHAKE, (GLUCERNA SHAKE) LIQD Take 237 mLs by mouth daily.    [provider]  insulin glargine (LANTUS) 100 UNIT/ML injection Inject 0.2 mLs (20 Units total) into the skin at bedtime. 06/27/20   Virl Axe, MD  levETIRAcetam (KEPPRA) 750 MG tablet Take 1 tablet (750 mg total) by mouth 2 (two) times daily. 10/20/16   Raiford Noble Latif, DO  metFORMIN (GLUCOPHAGE) 1000 MG tablet Take 1,000 mg by mouth 2 (two) times daily with a meal.    [provider]  mirtazapine (REMERON) 7.5 MG tablet Take 7.5 mg by mouth at bedtime.    [provider]  pantoprazole (PROTONIX) 20 MG tablet Take 20 mg by mouth daily.    [provider]  Polyethyl Glycol-Propyl Glycol (SYSTANE) 0.4-0.3 % SOLN Place 1 drop into both eyes every 8 (eight) hours.    [provider]  polyethylene glycol (MIRALAX / GLYCOLAX) packet Take 17 g by mouth 2 (two) times daily. 02/07/17   Tammi Klippel, Sherin, DO  senna-docusate (SENOKOT-S) 8.6-50 MG tablet Take 1 tablet by mouth at bedtime.    [provider]  sitaGLIPtin (JANUVIA) 100 MG tablet Take 100 mg by mouth daily.    [provider]  tamsulosin (FLOMAX) 0.4 MG CAPS capsule Take 2 capsules (0.8 mg total) by mouth daily after supper. 12/31/16   Caroline More, DO  White Petrolatum-Mineral Oil (ALTALUBE) 85-15 % OINT Place 1 application into both eyes at bedtime. 01/23/18   [provider]    Allergies    Patient has no known allergies.  Review of Systems   Review of Systems  Constitutional:  Negative for chills, diaphoresis, fatigue and fever.   HENT:  Negative for congestion.   Respiratory:  Positive for shortness of breath. Negative for chest tightness and wheezing.   Cardiovascular:  Positive for chest pain. Negative for palpitations and leg swelling.  Gastrointestinal:  Positive for abdominal pain (chronic abd pain similar to prior) and nausea. Negative for constipation, diarrhea and vomiting.  Genitourinary:  Negative for flank pain.  Musculoskeletal:  Negative for back pain, neck pain and neck stiffness.  Skin:  Negative for wound.  Neurological:  Negative for light-headedness and headaches.  Psychiatric/Behavioral:  Negative for agitation.   All other systems reviewed and are negative.  Physical Exam Updated Vital Signs BP (!) 156/115 (BP Location: Right Arm)   Pulse 78   Temp 98.8 F (37.1 C) (Oral)   Resp 18   SpO2 92%   Physical Exam Vitals and nursing note reviewed.  Constitutional:      General: He is not in acute distress.    Appearance: He is well-developed. He is not ill-appearing, toxic-appearing or diaphoretic.  HENT:     Head: Normocephalic and atraumatic.  Eyes:     Conjunctiva/sclera: Conjunctivae normal.  Cardiovascular:     Rate and Rhythm: Normal rate and regular rhythm.     Heart sounds: No murmur heard. Pulmonary:     Effort: Pulmonary effort is normal. No respiratory distress.     Breath sounds: Normal breath sounds. No decreased breath sounds, wheezing, rhonchi or rales.  Chest:     Chest wall: No tenderness.  Abdominal:     Palpations: Abdomen is soft.     Tenderness: There is no abdominal tenderness.  Musculoskeletal:     Cervical back: Neck supple.     Right lower leg: Edema present.     Left lower leg: Edema present.  Skin:    General: Skin is warm and dry.     Capillary Refill: Capillary refill takes less than 2 seconds.     Findings: No erythema.  Neurological:     General: No focal deficit present.     Mental Status: He is alert.    ED Results / Procedures / Treatments    Labs (all labs ordered are listed, but only abnormal results are displayed) Labs Reviewed  BASIC METABOLIC PANEL - Abnormal; Notable for the following components:      Result Value   Glucose, Bld 122 (*)    Creatinine, Ser 1.47 (*)    Calcium 8.6 (*)    GFR, Estimated 53 (*)    All other components within normal limits  CBC WITH DIFFERENTIAL/PLATELET - Abnormal; Notable for the following components:   Hemoglobin 12.0 (*)    MCHC 29.9 (*)    RDW 16.1 (*)    All other components within normal limits  BRAIN NATRIURETIC PEPTIDE - Abnormal; Notable for the following components:   B Natriuretic Peptide 2,244.0 (*)    All other components within normal limits  HEMOGLOBIN A1C - Abnormal; Notable for the following components:   Hgb A1c MFr Bld 6.1 (*)    All other components within normal limits  TROPONIN I (HIGH SENSITIVITY) - Abnormal; Notable for the following components:   Troponin I (High Sensitivity) 273 (*)    All other components within normal limits  TROPONIN I (HIGH SENSITIVITY) - Abnormal; Notable for the following components:   Troponin I (High Sensitivity) 275 (*)    All other components within normal limits  RESP PANEL BY RT-PCR (FLU A&B, COVID) ARPGX2  LIPID PANEL  HEPARIN LEVEL (UNFRACTIONATED)  CBC  BASIC METABOLIC PANEL  MAGNESIUM  CBG MONITORING, ED    EKG EKG Interpretation  Date/Time:  Saturday December 17 2020 13:40:11 EDT Ventricular Rate:  75 PR Interval:  174 QRS Duration: 112 QT Interval:  424 QTC Calculation: 473 R Axis:   -35 Text Interpretation: Sinus rhythm with Premature supraventricular complexes Left axis deviation Minimal voltage criteria for LVH, may be normal variant ( Cornell product ) Possible Inferior infarct ,  age undetermined T wave abnormality, consider lateral ischemia Abnormal ECG When compared to prior, similar appearance. No STEMI Confirmed by Antony Blackbird [phone]) on 12/17/2020 3:49:48 PM  Radiology DG Chest 2 View  Result  Date: 12/17/2020 CLINICAL DATA:  Chest pain, fall 2 days ago EXAM: CHEST - 2 VIEW COMPARISON:  02/05/2017 FINDINGS: Gross cardiomegaly. Mild, diffuse bilateral interstitial pulmonary opacity and small bilateral pleural effusions. Osseous structures are unremarkable. IMPRESSION: Cardiomegaly with mild, diffuse bilateral interstitial pulmonary opacity and small bilateral pleural effusions, likely edema. Electronically Signed   By: Delanna Ahmadi M.D.   On: 12/17/2020 14:15    Procedures Procedures   CRITICAL CARE Performed by: Gwenyth Allegra Kathrynne Kulinski Total critical care time: 35 minutes Critical care time was exclusive of separately billable procedures and treating other patients. Critical care was necessary to treat or prevent imminent or life-threatening deterioration. Critical care was time spent personally by me on the following activities: development of treatment plan with patient and/or surrogate as well as nursing, discussions with consultants, evaluation of patient's response to treatment, examination of patient, obtaining history from patient or surrogate, ordering and performing treatments and interventions, ordering and review of laboratory studies, ordering and review of radiographic studies, pulse oximetry and re-evaluation of patient's condition.   Medications Ordered in ED Medications  heparin ADULT infusion 100 units/mL (25000 units/244mL) (1,200 Units/hr Intravenous New Bag/Given 12/17/20 1759)  atorvastatin (LIPITOR) tablet 40 mg (40 mg Oral Given 12/17/20 2128)  bisoprolol (ZEBETA) tablet 5 mg (5 mg Oral Given 12/17/20 1953)  polyethylene glycol (MIRALAX / GLYCOLAX) packet 17 g (17 g Oral Given 12/17/20 2128)  senna-docusate (Senokot-S) tablet 1 tablet (1 tablet Oral Given 12/17/20 2128)  divalproex (DEPAKOTE) DR tablet 125 mg (125 mg Oral Patient Refused/Not Given 12/17/20 2129)  levETIRAcetam (KEPPRA) tablet 750 mg (750 mg Oral Patient Refused/Not Given 12/17/20 2129)  insulin  aspart (novoLOG) injection 0-9 Units (has no administration in time range)  aspirin EC tablet 81 mg (has no administration in time range)  sertraline (ZOLOFT) tablet 50 mg (has no administration in time range)  pantoprazole (PROTONIX) EC tablet 20 mg (has no administration in time range)  tamsulosin (FLOMAX) capsule 0.8 mg (has no administration in time range)  dorzolamide-timolol (COSOPT) 22.3-6.8 MG/ML ophthalmic solution 1 drop (1 drop Left Eye Patient Refused/Not Given 12/17/20 2141)  polyvinyl alcohol (LIQUIFILM TEARS) 1.4 % ophthalmic solution 1 drop (has no administration in time range)  Netarsudil Dimesylate 0.02 % SOLN 1 drop (1 drop Left Eye Patient Refused/Not Given 12/17/20 2158)  artificial tears (LACRILUBE) ophthalmic ointment 1 application (1 application Both Eyes Patient Refused/Not Given 12/17/20 2141)  brimonidine (ALPHAGAN) 0.2 % ophthalmic solution 1 drop (1 drop Left Eye Patient Refused/Not Given 12/17/20 2141)    And  brimonidine (ALPHAGAN) 0.2 % ophthalmic solution 1 drop (1 drop Right Eye Patient Refused/Not Given 12/17/20 2141)  fentaNYL (SUBLIMAZE) injection 50 mcg (50 mcg Intravenous Given 12/17/20 1653)  furosemide (LASIX) injection 40 mg (40 mg Intravenous Given 12/17/20 1653)  heparin bolus via infusion 4,000 Units (4,000 Units Intravenous Bolus from Bag 12/17/20 1800)    ED Course  I have reviewed the triage vital signs and the nursing notes.  Pertinent labs & imaging results that were available during my care of the patient were reviewed by me and considered in my medical decision making (see chart for details).    MDM Rules/Calculators/A&P  [name] is a 65 y.o. male with a past medical history significant for hypertension, diabetes, diabetic myopathy, CKD, hyperlipidemia, seizures, stroke, and bilateral leg amputations who presents with chest pain.  According to patient, for the last 2 days he has had chest discomfort that  is on the left side of his chest primarily.  He reports he chronically has some abdominal discomfort with constipation and this feels the same.  He reports that eating did seem to make his symptoms worse but so did exertion.  He denies any nausea or vomiting.  He reports no shortness of breath or diaphoresis with it and it did not radiate to his arms or back.  On exam, lungs are clear and chest is nontender.  Abdomen is nontender on my exam.  He has his bilateral leg amputations.  Good pulses in upper extremities.  Patient is resting comfortably but is still complaining of some discomfort in his chest and abdomen.  EKG did not show STEMI however while in triage and his work-up was initiated, he was found to have an elevated troponin around 270.  Cardiology was called who recommended heparinization and admission to medicine service and they will follow along.  I also discussed with cardiology and they agree that this does not sound like a aortic dissection type discomfort and agree with medicine admission at this time.  Otherwise his creatinine is similar elevated to prior and he has no leukocytosis and mild anemia.  COVID and flu swabs ordered.  Medicine team called for admission.  Was given some pain medicine.   Medicine will admit for further management of chest pain with elevated troponin.   Final Clinical Impression(s) / ED Diagnoses Final diagnoses:  Precordial pain  Elevated troponin     Clinical Impression: 1. Precordial pain   2. Elevated troponin     Disposition: Admit  This note was prepared with assistance of Dragon voice recognition software. Occasional wrong-word or sound-a-like substitutions may have occurred due to the inherent limitations of voice recognition software.     Terralyn Matsumura, Gwenyth Allegra, MD 12/18/20 [phone]

## 2020-12-17 NOTE — Consult Note (Addendum)
Cardiology Consult:   Patient ID: [name] MRN: [id number]; DOB: [birth year]   Consult Date: 12/17/2020  PCP:  [name], MD   [place] [facility] Cardiologist: Previously followed by Dr. Haroldine Laws in 2018 --> No outpatient follow-up since  Chief Complaint: Chest Pain  Patient Profile:   [name] is a 65 y.o. male with HFimpEF/NICM (EF 15% in 07/2015 with cath showing minimal CAD, EF at 20-25% in 09/2016 and improved to 50-55% in 06/2020), HTN, HLD, PAD (s/p bilateral BKA) and Stage 3 CKD who is being seen 12/17/2020 for the evaluation of chest pain and elevated troponin values at the request of Dr. Sherry Ruffing.  History of Present Illness:   [name] was last evaluated by the cardiology service in 10/2016 during an admission for an acute CHF exacerbation. He was not felt to be a candidate for advanced therapies at that time given his comorbidities and overall noncompliance, therefore he was continued on medical therapy. He did not follow-up after that admission but did have a repeat echocardiogram in 06/2020 which showed his EF had improved to 50 to 55%.  He presented to [name] this afternoon for evaluation of chest pain for the past 2 days. In talking with the patient today, he reports he resides at [facility] but uses a prosthesis for ambulation but is typically in a wheelchair. Over the past week, he has experienced intermittent episodes of chest discomfort which he describes as a sternal pain and can occur at rest. Symptoms can last from minutes to hours at a time. Also reports abdominal discomfort with associated constipation. No recent melena, hematochezia or hematuria. Over the past several days, he does report orthopnea along with PND and has been sleeping with his head elevated. Has noticed worsening abdominal distention and feels like his weight is approximately 10 pounds above baseline but is only weighed once a month at his facility. By review of his medication  list, he is no longer on diuretic therapy and it appears his Entresto, Bisoprolol and Torsemide were discontinued following his admission in 06/2020.  Initial labs show WBC 8.6, Hgb 12.0, platelets 352, Na+ 143, K+ 3.6, creatinine 1.47 (close to baseline). Initial Hs Troponin 273 with repeat values pending. CXR showing cardiomegaly with mild, diffuse interstitial pulmonary opacity and small pleural effusions. EKG shows NSR, HR 75 with PAC's and LAD with TWI along the lateral leads which is similar to prior tracings.   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   Left body jerking   Hypertension    Neuropathy    Noncompliance with medications    Open knee wound 10/2015   on rt bka   PAD (peripheral artery disease) (HCC)    Protein calorie malnutrition (HCC)    Shortness of breath dyspnea     Past Surgical History:  Procedure Laterality Date   ABDOMINAL AORTOGRAM W/LOWER EXTREMITY N/A 03/25/2017   Procedure: ABDOMINAL  AORTOGRAM W/LOWER EXTREMITY;  Surgeon: Angelia Mould, MD;  Location: [place] [facility];  Service: Cardiovascular;  Laterality: N/A;   AMPUTATION Right 09/26/2013   Procedure: AMPUTATION BELOW KNEE;  Surgeon: Rozanna Box, MD;  Location: [place];  Service: Orthopedics;  Laterality: Right;   AMPUTATION Right 05/01/2015   Procedure: REVISION OF RIGHT TRANSTIBIAL AMPUTATION ;  Surgeon: Newt Minion, MD;  Location: [place];  Service: Orthopedics;  Laterality: Right;    AMPUTATION Left 06/24/2020   Procedure: LEFT BELOW KNEE AMPUTATION;  Surgeon: Newt Minion, MD;  Location: [place];  Service: Orthopedics;  Laterality: Left;   APPLICATION OF WOUND VAC  08/10/2016   Procedure: APPLICATION OFI NCISONAL  WOUND VAC;  Surgeon: Newt Minion, MD;  Location: [place];  Service: Orthopedics;;   CARDIAC CATHETERIZATION N/A 07/13/2015   Procedure: Right/Left Heart Cath and Coronary Angiography;  Surgeon: Jolaine Artist, MD;  Location: [place] [facility];  Service: Cardiovascular;  Laterality: N/A;   COLONOSCOPY     I & D EXTREMITY Right 09/24/2013   Procedure: IRRIGATION AND DEBRIDEMENT RIGHT FOOT ULCER;  Surgeon: Renette Butters, MD;  Location: [place];  Service: Orthopedics;  Laterality: Right;   I & D EXTREMITY Right 09/26/2013   Procedure: Repeat IRRIGATION AND DEBRIDEMENT Right Foot Ulcer;  Surgeon: Rozanna Box, MD;  Location: [place];  Service: Orthopedics;  Laterality: Right;   MULTIPLE TOOTH EXTRACTIONS     SKIN SPLIT GRAFT Right 08/10/2016   Procedure: SKIN GRAFT SPLIT THICKNESS RIGHT LEG;  Surgeon: Newt Minion, MD;  Location: [place];  Service: Orthopedics;  Laterality: Right;   STUMP REVISION Right 04/20/2015   Procedure: Revision Right Below Knee Amputation, Apply Wound VAC;  Surgeon: Newt Minion, MD;  Location: [place];  Service: Orthopedics;  Laterality: Right;   TONSILLECTOMY       Medications Prior to Admission: Prior to Admission medications   Medication Sig Start Date End Date Taking? Authorizing Provider  acetaminophen (TYLENOL) 325 MG tablet Take 650 mg by mouth every 6 (six) hours as needed for mild pain.    [provider]  Amino Acids-Protein Hydrolys (PRO-STAT) LIQD Take 30 mLs by mouth 2 (two) times daily.    [provider]  aspirin 81 MG EC tablet Take 81 mg by mouth daily.    [provider]  atorvastatin (LIPITOR) 40 MG tablet Take 40 mg by mouth at bedtime.  08/25/15   [provider]  bisoprolol (ZEBETA) 5  MG tablet Take 1 tablet (5 mg total) by mouth daily. Patient taking differently: Take 5 mg by mouth daily as needed (elevated BP 150/90). 10/19/16   Hosie Poisson, MD  brimonidine (ALPHAGAN) 0.2 % ophthalmic solution PLACE 1 DROP INTO THE LEFT EYE 2 (TWO) TIMES DAILY. Patient taking differently: Place 1 drop into the right eye 2 (two) times daily. 06/15/15   Mariel Aloe, MD  chlorhexidine (HIBICLENS) 4 % external liquid Apply 1 application topically daily. Apply to left foot    [provider]  divalproex (DEPAKOTE) 125 MG DR tablet Take 125 mg by mouth 2 (two) times daily.    [provider]  dorzolamide-timolol (COSOPT) 22.3-6.8 MG/ML ophthalmic solution Place 1 drop into the left eye 2 (two) times daily. Patient taking differently: Place 1 drop into the right eye 2 (two) times daily. 09/28/13   Virginia Crews, MD  feeding supplement, GLUCERNA SHAKE, (GLUCERNA SHAKE) LIQD Take 237 mLs by mouth daily.  [provider]  insulin glargine (LANTUS) 100 UNIT/ML injection Inject 0.2 mLs (20 Units total) into the skin at bedtime. 06/27/20   Virl Axe, MD  levETIRAcetam (KEPPRA) 750 MG tablet Take 1 tablet (750 mg total) by mouth 2 (two) times daily. 10/20/16   Raiford Noble Latif, DO  metFORMIN (GLUCOPHAGE) 1000 MG tablet Take 1,000 mg by mouth 2 (two) times daily with a meal.    [provider]  mirtazapine (REMERON) 7.5 MG tablet Take 7.5 mg by mouth at bedtime.    [provider]  pantoprazole (PROTONIX) 20 MG tablet Take 20 mg by mouth daily.    [provider]  Polyethyl Glycol-Propyl Glycol (SYSTANE) 0.4-0.3 % SOLN Place 1 drop into both eyes every 8 (eight) hours.    [provider]  polyethylene glycol (MIRALAX / GLYCOLAX) packet Take 17 g by mouth 2 (two) times daily. 02/07/17   Tammi Klippel, Sherin, DO  senna-docusate (SENOKOT-S) 8.6-50 MG tablet Take 1 tablet by mouth at bedtime.    [provider]  sitaGLIPtin  (JANUVIA) 100 MG tablet Take 100 mg by mouth daily.    [provider]  tamsulosin (FLOMAX) 0.4 MG CAPS capsule Take 2 capsules (0.8 mg total) by mouth daily after supper. 12/31/16   Caroline More, DO  White Petrolatum-Mineral Oil (ALTALUBE) 85-15 % OINT Place 1 application into both eyes at bedtime. 01/23/18   [provider]     Allergies:   No Known Allergies  Social History:   Social History   Socioeconomic History   Marital status: Married    Spouse name: Not on file   Number of children: 0   Years of education: 12   Highest education level: Not on file  Occupational History   Not on file  Tobacco Use   Smoking status: Former    Years: 20.00    Types: Cigarettes    Quit date: 05/10/2000    Years since quitting: 20.6   Smokeless tobacco: Former    Quit date: 02/22/2000  Vaping Use   Vaping Use: Never used  Substance and Sexual Activity   Alcohol use: No    Alcohol/week: 0.0 standard drinks   Drug use: No   Sexual activity: Not on file  Other Topics Concern   Not on file  Social History Narrative   ** Merged History Encounter **       Lives alone.    Caffeine use: Tea/soda daily   Coffee sometimes    Left handed    Social Determinants of Health   Financial Resource Strain: Not on file  Food Insecurity: Not on file  Transportation Needs: Not on file  Physical Activity: Not on file  Stress: Not on file  Social Connections: Not on file  Intimate Partner Violence: Not on file    Family History:   The patient's family history includes Cancer in his mother; Peripheral vascular disease in his father.    ROS:  Please see the history of present illness.  All other ROS reviewed and negative.     Physical Exam/Data:   Vitals:   12/17/20 1545 12/17/20 1600 12/17/20 1700 12/17/20 1715  BP: (!) 165/122 (!) 169/125 (!) 172/122   Pulse: 88 81 80 81  Resp: 14 (!) 21 (!) 33 (!) 21  Temp:      TempSrc:      SpO2: 98% 95% 99% 99%   No intake or  output data in the 24 hours ending 12/17/20 1751 Last 3 Weights  06/26/2020 06/25/2020 06/24/2020  Weight (lbs) 200 lb 13.4 oz 199 lb 8.3 oz 196 lb 10.4 oz  Weight (kg) 91.1 kg 90.5 kg 89.2 kg     There is no height or weight on file to calculate BMI.  General:  Well nourished, well developed male appearing in no acute distress HEENT: normal Neck: JVD at 12 cm. Vascular: No carotid bruits; Distal pulses 2+ bilaterally   Cardiac:  normal S1, S2; RRR with occasional ectopic beats.  Lungs: rales along bases bilaterally.  Abd: soft, nontender, no hepatomegaly  Ext: no pitting edema Musculoskeletal: Bilateral BKA's.  Skin: warm and dry  Neuro:  CNs 2-12 intact, no focal abnormalities noted Psych:  Normal affect    EKG:  The ECG that was done was personally reviewed and demonstrates NSR, HR 75 with PAC's and LAD with TWI along the lateral leads which is similar to prior tracings.   Relevant CV Studies:  Cardiac Catheterization: 07/2015 Mid LAD lesion, 20% stenosed.   Findings:   Ao = 135/87 (107) LV = 141/20/30 RA = 17 RV = 67/11/20 PA = 62/29 (41) PCW = 35 (v wave 45-50) Fick cardiac output/index = 6.0/2.7 PVR = 1.0 WU SVR = 1198  FA sat = 88% PA sat = 58%, 57%   Assessment: 1. Minimal non-obstructive CAD 2. Severe NICM with EF 15% by echo 3. Markedly elevated filling pressures with normal PVR 4. Normal cardiac output   Plan/Discussion:   He has severe NICM. Still markedly volume overload. Continue diuresis. Pulmonary HTN is all left-sided. PA pressures should normalize with diuresis.   Echocardiogram: 06/2020 IMPRESSIONS     1. Inferior basal hypokinesis . Left ventricular ejection fraction, by  estimation, is 50 to 55%. The left ventricle has low normal function. The  left ventricle demonstrates regional wall motion abnormalities (see  scoring diagram/findings for  description). The left ventricular internal cavity size was mildly  dilated. There is mild left  ventricular hypertrophy. Left ventricular  diastolic parameters were normal.   2. Right ventricular systolic function is normal. The right ventricular  size is normal. There is normal pulmonary artery systolic pressure.   3. A small pericardial effusion is present. The pericardial effusion is  posterior to the left ventricle and lateral to the left ventricle.   4. The mitral valve is abnormal. Trivial mitral valve regurgitation. No  evidence of mitral stenosis.   5. Calcification of the right and non coronary commisure . The aortic  valve is tricuspid. There is moderate calcification of the aortic valve.  Aortic valve regurgitation is trivial. Mild to moderate aortic valve  sclerosis/calcification is present,  without any evidence of aortic stenosis.   6. The inferior vena cava is normal in size with greater than 50%  respiratory variability, suggesting right atrial pressure of 3 mmHg.   Laboratory Data:  High Sensitivity Troponin:   Recent Labs  Lab 12/17/20 1400 12/17/20 1609  TROPONINIHS 273* 275*      Chemistry Recent Labs  Lab 12/17/20 1400  NA 143  K 3.6  CL 110  CO2 27  GLUCOSE 122*  BUN 16  CREATININE 1.47*  CALCIUM 8.6*  GFRNONAA 53*  ANIONGAP 6    No results for input(s): PROT, ALBUMIN, AST, ALT, ALKPHOS, BILITOT in the last 168 hours. Lipids No results for input(s): CHOL, TRIG, HDL, LABVLDL, LDLCALC, CHOLHDL in the last 168 hours. Hematology Recent Labs  Lab 12/17/20 1400  WBC 8.6  RBC 4.61  HGB 12.0*  HCT 40.1  MCV 87.0  MCH 26.0  MCHC 29.9*  RDW 16.1*  PLT 352   Thyroid No results for input(s): TSH, FREET4 in the last 168 hours. BNPNo results for input(s): BNP, PROBNP in the last 168 hours.  DDimer No results for input(s): DDIMER in the last 168 hours.   Radiology/Studies:  DG Chest 2 View  Result Date: 12/17/2020 CLINICAL DATA:  Chest pain, fall 2 days ago EXAM: CHEST - 2 VIEW COMPARISON:  02/05/2017 FINDINGS: Gross cardiomegaly. Mild,  diffuse bilateral interstitial pulmonary opacity and small bilateral pleural effusions. Osseous structures are unremarkable. IMPRESSION: Cardiomegaly with mild, diffuse bilateral interstitial pulmonary opacity and small bilateral pleural effusions, likely edema. Electronically Signed   By: Delanna Ahmadi M.D.   On: 12/17/2020 14:15     Assessment and Plan:   1. Acute CHF Exacerbation/HFimpEF - He had a prior cardiomyopathy with EF at 15% in 07/2015 with cath showing minimal CAD and his EF had normalized to 50-55% in 06/2020. He presents with worsening orthopnea, PND and abdominal distention. Will add a BNP to prior labs. CXR is consistent with CHF and shows bilateral pleural effusions. Will give IV Lasix 40 mg now and anticipate starting 40 mg twice daily at the time of admission. Follow I&O's along with daily weights. Will obtain a repeat echocardiogram for reassessment of his EF as this will help guide further medication adjustments.   2. Elevated Troponin Values - Initial Hs Troponin 273 with repeat Hs Troponin pending. He did have minimal CAD by prior catheterization in 2017. - He does report intermittent episodes of chest pain at rest but symptoms are atypical as they can last for up to hours at a time. Will continue to trend cyclic enzymes for if they remain flat, this is likely secondary to demand ischemia in the setting of a CHF exacerbation. Will start Heparin until a trend can be obtained. Continue ASA, Bisoprolol 5mg  daily and Atorvastatin 40mg  daily.   3. HTN - His blood pressure has been elevated while in the ED but he has not yet received any of his routine medications. It appears he is only on Bisoprolol 5 mg daily and would restart this. He will likely require further medication adjustments pending echo results.   4. HLD - Recheck FLP this admission. Would continue Atorvastatin 40mg  daily.   5. Stage 3 CKD - His creatinine is at 1.47 today, similar to prior values earlier this year.  Continue to follow with diuresis.   6. PAD - He is s/p bilateral BKA's. Remains on ASA and statin therapy.    Risk Assessment/Risk Scores:   New York Heart Association (NYHA) Functional Class NYHA Class III  For questions or updates, please contact [facility] Please consult [email or web address] for contact info under     Signed, Erma Heritage, PA-C  12/17/2020 5:51 PM    Personally seen and examined. Agree with APP above with the following comments: Briefly 65 yo M with a history of HFrEF 20-25% that improved to 50-55% with GDMT , bilateral BKA, and Stage 3b CKD.   Patient notes that he was taken GDMT because he was all better.  Notes that he has since has shortness of breath, chest pain, and abdominal distention.  Feels like he has had weight gain as well.  Notes abdominal discomfort is coupled with constipation/ Exam notable for decreased breath sounds, JVD and abdominal distention.  There is a positive fluid wave.  There is some edema in the thighs, rare PVCs Labs notable for  creatinine 1.47; troponin is flat 273-275 Personally reviewed relevant tests; PVCs on telemetry Would recommend  - suspect troponin elevated is related to demand ischemia CP resolved during my exam - restarted back some of his GDMT as above and IV lasix.  Repeat echo is pending.  I suspect a worsened EF and will work to rebuild in therapies  If BP elevated overnight can restart his Entresto at low dose  Rudean Haskell, MD [place]  [address] [place], [place] [address] [phone]  6:12 PM

## 2020-12-17 NOTE — ED Provider Notes (Signed)
Emergency Medicine Provider Triage Evaluation Note  [name] , a 65 y.o. male  was evaluated in triage.  Pt complains of Chest pain.  Patient states it feels like heartburn, worse after he eats and when he lays down flat.  Been going on the last 3 days.  Patient lives in a nursing home, sent to ED for chest pain evaluation..  Review of Systems  Positive: Chest pain Negative: Vomiting  Physical Exam  BP (!) 156/115 (BP Location: Right Arm)   Pulse 78   Temp 98.8 F (37.1 C) (Oral)   Resp 18   SpO2 92%  Gen:   Awake, no distress   Resp:  Normal effort  MSK:   Moves extremities without difficulty  Other:    Medical Decision Making  Medically screening exam initiated at 1:53 PM.  Appropriate orders placed.  [name] was informed that the remainder of the evaluation will be completed by another provider, this initial triage assessment does not replace that evaluation, and the importance of remaining in the ED until their evaluation is complete.  Suspect GERD, will do chest pain work-up to eval for possible ACS   Sherrill Raring, PA-C 12/17/20 1354    Davonna Belling, MD 12/17/20 1650

## 2020-12-17 NOTE — H&P (Addendum)
[facility] Admission History and Physical Service Pager: [phone]  Patient name: [name] Medical record number: [id number] Date of birth: 07/19/1955 Age: 65 y.o. Gender: male  Primary Care Provider: [name], MD Consultants: Cardiology Code Status: Full Preferred Emergency Contact: Delbert Phenix (sister) [phone]  Chief Complaint: Chest pain  Assessment and Plan: [name] is a 65 y.o. male presenting with chest pain. PMH is significant for HTN, HLD, HFimpEF, NICM, DM2, s/p bilateral BKA, CKD stage III, prior CVA, seizures.  Chest Pain  ACS Patient reports sharp, substernal chest pain x3-4 days that occurs after eating, not worsened by exertion. Patient also reports worsening orthopnea and PND and a weight gain of 10-15 lbs in his abdomen. In the ED vitals within normal limits other than hypertension (301S-010X systolic, 323F-573U diastolic). Workup remarkable for elevated troponin to 273>275. EKG without acute ST changes. CXR with diffuse opacities, likely pulmonary edema. His ACS may be demand ischemia in the context of HF exacerbation. He has already been started on a heparin drip and given IV Lasix 40 mg x1.  -Admit to FPTS, med-tele, attending Dr Erin Hearing -Cardiology consulted, appreciate recommendations -Continue heparin gtt per cards/pharmacy -Cardiac monitoring -Additional diuresis per cardiology -BNP -Echo -Strict I&O's -Daily weights -Up with assistance (patient reports moving with a wheelchair)  CHF Last echo 06/2020 showed EF 50-55% (improved from prior 15% in 2017 and 20-25% in 2018). Entresto and torsemide were discontinued after admission in April 2022 given that patient came in with sepsis. It is currently unknown if these medications were restarted once the patient got back to his ALF.   -Cardiology following, appreciate recommendations -Diuresis as above -Strict I/Os, daily weights -f/u echo results -Continue ASA  81 mg -Continue bisoprolol 5 mg daily -Awaiting formal med rec  Abdominal pain Patient reports mild RLQ abdominal pain that began several weeks ago. Patient feels this is likely 2/2 constipation. Abdominal exam is benign. 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 Last A1C of 8.0% on 06/22/20. Appears home medications include: Lantus 20u daily, Januvia 100mg  daily, Metformin 1000mg  BID. -Await formal med rec -Likely start 10u Semglee if home meds confirmed as above -Sensitive SSI -CBG monitoring qAC and qHS -Obtain hemoglobin A1c -Hypoglycemia protocol  HTN BP elevated on admission to 202-542H systolic, 062-376E diastolic. Home meds: bisoprolol 5mg  daily -Continue home Bisoprolol -Monitor BP  CKD Stage IIIB Baseline Cr of approximately 1.2-1.3. Cr 1.47 on admission.  -Daily BMP  Hx of seizures No recent seizure activity. Awaiting formal med rec- appears home meds include Keppra 750mg  BID and Depakote 125mg  BID. -Continue home Keppra and Depakote -Consider Depakote level  HLD Last lipid panel in 2018 showed LDL 45, HDL 46, total cholesterol 98. Home meds: atorvastatin 40mg  daily -Obtain lipid panel -Continue atorvastatin 40 mg  FEN/GI: Carb modified/heart healthy Prophylaxis: on Heparin gtt  Disposition: med-tele  History of Present Illness:  [name] is a 65 y.o. male presenting with chest pain. He reports central chest pain that has been present for 3-4 days. Described as mild, sharp, worse after eating. Feels better when he's sitting up, worse when he's laying down. Nonradiating. Occasional nausea, but none currently. Endorses shortness of breath when laying down, which resolves when he sits up. He lives at [place]. Reports he was having a lot of constipation, was doing a lot of moaning. Abdominal pain for about 2 weeks. Last BM  day before yesterday.  Review Of  Systems: Per HPI with the following additions:  Review of Systems  Constitutional:  Negative for appetite change, chills and fever.  Respiratory:  Positive for shortness of breath.   Cardiovascular:  Positive for chest pain.  Gastrointestinal:  Positive for abdominal pain and constipation. Negative for nausea and vomiting.  Skin:  Negative for rash and wound.  Neurological:  Negative for seizures.  Psychiatric/Behavioral:  Negative for confusion.     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   Irregular heart rate    Acute encephalopathy 01/31/2017   Cellulitis of right upper extremity    Anasarca    Altered mental status    Slurred speech    Hypothermia 01/30/2017   Pressure injury of skin 12/21/2016   CHF exacerbation ([place]) 12/20/2016   Peripheral edema    Benign prostatic hyperplasia with lower urinary tract symptoms    Embolic stroke ([place]) 21/19/4174   Cardiomyopathy, dilated ([place]) 10/19/2016   Acute on chronic systolic heart failure, NYHA class 3 ([place]) 10/19/2016   Moderate to severe pulmonary hypertension ([place]) 10/19/2016   Severe tricuspid regurgitation by prior echocardiogram 10/19/2016   Diabetes mellitus type 2 in obese ([place]) 10/19/2016   HLD (hyperlipidemia) 10/19/2016   Seizure ([place]) 10/19/2016   Fluid overload 10/14/2016   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 ([place]) 10/09/2016   Cardiorenal syndrome with renal failure 09/28/2016   Acute urinary retention 09/26/2016   Acute on chronic combined  systolic and diastolic CHF (congestive heart failure) ([place]) 09/26/2016   Scrotal edema 09/24/2016   Multiple open wounds of lower leg, initial encounter 09/24/2016   Tinea of groin 09/24/2016   Chronic kidney disease (CKD), stage III (moderate) ([place]) 06/07/2016   Chronic combined systolic and diastolic congestive heart failure (HCC)    Protein calorie malnutrition ([place]) 07/12/2015   Congestive dilated cardiomyopathy ([place]) 07/11/2015   Pressure ulcer 07/09/2015   Open knee wound 07/06/2015   History of right below knee amputation ([place]) 02/22/2015   PAD (peripheral artery disease) ([place]) 02/22/2015   Blindness of left eye 10/16/4816   Complications, amputation stump late ([place]) 08/06/2014   Diabetic neuropathy associated with type 2 diabetes mellitus ([place]) 12/01/2013   Noncompliance with medications 07/13/2010   OTHER SPEC TYPES SCHIZOPHRENIA UNSPEC CONDITION 05/24/2009   Obesity, unspecified 06/09/2007   DM (diabetes mellitus), type 2, uncontrolled 05/02/2006   Essential hypertension 05/02/2006    Past Medical History: Past Medical History:  Diagnosis Date   Acute on chronic systolic heart failure, NYHA class 3 ([place])    AKI (acute kidney injury) ([place])    Anemia    BKA stump complication ([place])    Blind left eye    Blind left eye    Cardiomyopathy, dilated (HCC)    CHF (congestive heart failure) (HCC)    Combined systolic and diastolic congestive heart failure ([place])    Congestive dilated cardiomyopathy ([place]) 07/11/2015   Diabetes mellitus    Diabetic neuropathy associated with type 2 diabetes mellitus ([place])  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   Left body jerking   Hypertension    Neuropathy    Noncompliance with medications    Open knee wound 10/2015   on rt bka   PAD (peripheral artery disease) (HCC)    Protein calorie malnutrition (HCC)    Shortness of breath dyspnea     Past Surgical History: Past Surgical History:  Procedure Laterality Date   ABDOMINAL  AORTOGRAM W/LOWER EXTREMITY N/A 03/25/2017   Procedure: ABDOMINAL AORTOGRAM W/LOWER EXTREMITY;  Surgeon: Angelia Mould, MD;  Location: [facility];  Service: Cardiovascular;  Laterality: N/A;   AMPUTATION Right 09/26/2013   Procedure: AMPUTATION BELOW KNEE;  Surgeon: Rozanna Box, MD;  Location: [facility];  Service: Orthopedics;  Laterality: Right;   AMPUTATION Right 05/01/2015   Procedure: REVISION OF RIGHT TRANSTIBIAL AMPUTATION ;  Surgeon: Newt Minion, MD;  Location: [facility];  Service: Orthopedics;  Laterality: Right;   AMPUTATION Left 06/24/2020   Procedure: LEFT BELOW KNEE AMPUTATION;  Surgeon: Newt Minion, MD;  Location: [facility];  Service: Orthopedics;  Laterality: Left;   APPLICATION OF WOUND VAC  08/10/2016   Procedure: APPLICATION OFI NCISONAL  WOUND VAC;  Surgeon: Newt Minion, MD;  Location: [facility];  Service: Orthopedics;;   CARDIAC CATHETERIZATION N/A 07/13/2015   Procedure: Right/Left Heart Cath and Coronary Angiography;  Surgeon: Jolaine Artist, MD;  Location: [facility];  Service: Cardiovascular;  Laterality: N/A;   COLONOSCOPY     I & D EXTREMITY Right 09/24/2013   Procedure: IRRIGATION AND DEBRIDEMENT RIGHT FOOT ULCER;  Surgeon: Renette Butters, MD;  Location: [facility];  Service: Orthopedics;  Laterality: Right;   I & D EXTREMITY Right 09/26/2013   Procedure: Repeat IRRIGATION AND DEBRIDEMENT Right Foot Ulcer;  Surgeon: Rozanna Box, MD;  Location: [facility];  Service: Orthopedics;  Laterality: Right;   MULTIPLE TOOTH EXTRACTIONS     SKIN SPLIT GRAFT Right 08/10/2016   Procedure: SKIN GRAFT SPLIT THICKNESS RIGHT LEG;  Surgeon: Newt Minion, MD;  Location: [facility];  Service: Orthopedics;  Laterality: Right;   STUMP REVISION Right 04/20/2015   Procedure: Revision Right Below Knee Amputation, Apply Wound VAC;  Surgeon: Newt Minion, MD;  Location: [facility];  Service: Orthopedics;  Laterality: Right;   TONSILLECTOMY      Social History: Social History   Tobacco  Use   Smoking status: Former    Years: 20.00    Types: Cigarettes    Quit date: 05/10/2000    Years since quitting: 20.6   Smokeless tobacco: Former    Quit date: 02/22/2000  Vaping Use   Vaping Use: Never used  Substance Use Topics   Alcohol use: No    Alcohol/week: 0.0 standard drinks   Drug use: No    Family History: Family History  Problem Relation Age of Onset   Cancer Mother    Peripheral vascular disease Father     Allergies and Medications: No Known Allergies No current facility-administered medications on file prior to encounter.   Current Outpatient Medications on File Prior to Encounter  Medication Sig Dispense Refill   acetaminophen (TYLENOL) 325 MG tablet Take 650 mg by mouth every 6 (six) hours as needed for mild pain.     Amino Acids-Protein Hydrolys (PRO-STAT) LIQD Take 30 mLs by mouth 2 (two) times daily.     aspirin 81 MG EC tablet Take 81 mg by mouth daily.     atorvastatin (  LIPITOR) 40 MG tablet Take 40 mg by mouth at bedtime.      bisoprolol (ZEBETA) 5 MG tablet Take 1 tablet (5 mg total) by mouth daily. (Patient taking differently: Take 5 mg by mouth daily as needed (elevated BP 150/90).) 30 tablet 1   brimonidine (ALPHAGAN) 0.2 % ophthalmic solution PLACE 1 DROP INTO THE LEFT EYE 2 (TWO) TIMES DAILY. (Patient taking differently: Place 1 drop into the right eye 2 (two) times daily.) 5 mL 0   chlorhexidine (HIBICLENS) 4 % external liquid Apply 1 application topically daily. Apply to left foot     divalproex (DEPAKOTE) 125 MG DR tablet Take 125 mg by mouth 2 (two) times daily.     dorzolamide-timolol (COSOPT) 22.3-6.8 MG/ML ophthalmic solution Place 1 drop into the left eye 2 (two) times daily. (Patient taking differently: Place 1 drop into the right eye 2 (two) times daily.) 10 mL 12   feeding supplement, GLUCERNA SHAKE, (GLUCERNA SHAKE) LIQD Take 237 mLs by mouth daily.     insulin glargine (LANTUS) 100 UNIT/ML injection Inject 0.2 mLs (20 Units total) into  the skin at bedtime. 10 mL 2   levETIRAcetam (KEPPRA) 750 MG tablet Take 1 tablet (750 mg total) by mouth 2 (two) times daily. 60 tablet 0   metFORMIN (GLUCOPHAGE) 1000 MG tablet Take 1,000 mg by mouth 2 (two) times daily with a meal.     mirtazapine (REMERON) 7.5 MG tablet Take 7.5 mg by mouth at bedtime.     pantoprazole (PROTONIX) 20 MG tablet Take 20 mg by mouth daily.     Polyethyl Glycol-Propyl Glycol (SYSTANE) 0.4-0.3 % SOLN Place 1 drop into both eyes every 8 (eight) hours.     polyethylene glycol (MIRALAX / GLYCOLAX) packet Take 17 g by mouth 2 (two) times daily. 14 each 0   senna-docusate (SENOKOT-S) 8.6-50 MG tablet Take 1 tablet by mouth at bedtime.     sitaGLIPtin (JANUVIA) 100 MG tablet Take 100 mg by mouth daily.     tamsulosin (FLOMAX) 0.4 MG CAPS capsule Take 2 capsules (0.8 mg total) by mouth daily after supper. 60 capsule 0   White Petrolatum-Mineral Oil (ALTALUBE) 85-15 % OINT Place 1 application into both eyes at bedtime.      Objective: BP (!) 172/122   Pulse 81   Temp 98.8 F (37.1 C) (Oral)   Resp (!) 21   SpO2 99%  Physical Exam Vitals reviewed.  Constitutional:      General: He is not in acute distress. Eyes:     Extraocular Movements: Extraocular movements intact.  Neck:     Comments: No JVD appreciated Cardiovascular:     Rate and Rhythm: Normal rate and regular rhythm.     Heart sounds: Normal heart sounds. No murmur heard. Pulmonary:     Effort: Pulmonary effort is normal.     Comments: Crackles in the R base Abdominal:     General: Bowel sounds are normal.     Palpations: Abdomen is soft.     Tenderness: There is no abdominal tenderness.  Musculoskeletal:     Comments: S/p bilateral BKA  Skin:    General: Skin is warm and dry.     Findings: No rash.  Neurological:     Mental Status: He is alert and oriented to person, place, and time.     Labs and Imaging: CBC BMET  Recent Labs  Lab 12/17/20 1400  WBC 8.6  HGB 12.0*  HCT 40.1  PLT  352  Recent Labs  Lab 12/17/20 1400  NA 143  K 3.6  CL 110  CO2 27  BUN 16  CREATININE 1.47*  GLUCOSE 122*  CALCIUM 8.6*     EKG: NSR with no acute ST changes Troponin 273>275  DG Chest 2 View  Result Date: 12/17/2020 CLINICAL DATA:  Chest pain, fall 2 days ago EXAM: CHEST - 2 VIEW COMPARISON:  02/05/2017 FINDINGS: Gross cardiomegaly. Mild, diffuse bilateral interstitial pulmonary opacity and small bilateral pleural effusions. Osseous structures are unremarkable. IMPRESSION: Cardiomegaly with mild, diffuse bilateral interstitial pulmonary opacity and small bilateral pleural effusions, likely edema. Electronically Signed   By: Delanna Ahmadi M.D.   On: 12/17/2020 14:15     Corky Sox, MD PGY-1 [place] Intern pager: [phone], text pages welcome  FPTS Upper-Level Resident Addendum   I have independently interviewed and examined the patient. I have discussed the above with Dr. Alvie Heidelberg and agree with the documented plan. My edits for correction/addition/clarification are included above. Please see any attending notes.   Alcus Dad, MD PGY-2, [facility] 12/17/2020 6:54 PM  [place] Service pager: [phone] (text pages welcome through [facility])

## 2020-12-17 NOTE — Progress Notes (Signed)
ANTICOAGULATION CONSULT NOTE - Initial Consult  Pharmacy Consult for heparin Indication: chest pain/ACS  No Known Allergies  Patient Measurements:    Vital Signs: Temp: 98.8 F (37.1 C) (10/15 1338) Temp Source: Oral (10/15 1338) BP: 169/125 (10/15 1600) Pulse Rate: 81 (10/15 1600)  Labs: Recent Labs    12/17/20 1400  HGB 12.0*  HCT 40.1  PLT 352  CREATININE 1.47*  TROPONINIHS 273*    CrCl cannot be calculated (Unknown ideal weight.).   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   Left body jerking   Hypertension    Neuropathy    Noncompliance with medications    Open knee wound 10/2015   on rt bka   PAD (peripheral artery disease) (HCC)    Protein calorie malnutrition ([place])    Shortness of breath dyspnea     Assessment: 32 YOM presenting with CP, elevated troponin, he is not on anticoagulation PTA, chronic anemia stable  Goal of Therapy:  Heparin level 0.3-0.7 units/ml Monitor platelets by anticoagulation protocol: Yes   Plan:  Heparin 4000 units IV x 1, and gtt at 1200 units/hr F/u 6 hour heparin level F/u cards eval and recs  Bertis Ruddy, PharmD Clinical Pharmacist ED Pharmacist Phone # [phone] 12/17/2020 5:16 PM

## 2020-12-17 NOTE — ED Triage Notes (Signed)
Pt to triage via [facility] from [facility].  C/o chest pain x 2 days that is worse after eating.  Pt fell 2 days ago and states breathing has been "different" since fall.

## 2020-12-18 ENCOUNTER — Observation Stay (HOSPITAL_COMMUNITY): Payer: Medicaid Other

## 2020-12-18 DIAGNOSIS — R079 Chest pain, unspecified: Secondary | ICD-10-CM

## 2020-12-18 DIAGNOSIS — I5021 Acute systolic (congestive) heart failure: Secondary | ICD-10-CM | POA: Diagnosis not present

## 2020-12-18 DIAGNOSIS — R072 Precordial pain: Secondary | ICD-10-CM | POA: Diagnosis not present

## 2020-12-18 DIAGNOSIS — R778 Other specified abnormalities of plasma proteins: Secondary | ICD-10-CM | POA: Diagnosis not present

## 2020-12-18 DIAGNOSIS — I5023 Acute on chronic systolic (congestive) heart failure: Secondary | ICD-10-CM | POA: Diagnosis not present

## 2020-12-18 LAB — CBC
HCT: 39.9 % (ref 39.0–52.0)
Hemoglobin: 12.3 g/dL — ABNORMAL LOW (ref 13.0–17.0)
MCH: 26.5 pg (ref 26.0–34.0)
MCHC: 30.8 g/dL (ref 30.0–36.0)
MCV: 86 fL (ref 80.0–100.0)
Platelets: 340 10*3/uL (ref 150–400)
RBC: 4.64 MIL/uL (ref 4.22–5.81)
RDW: 16 % — ABNORMAL HIGH (ref 11.5–15.5)
WBC: 8.4 10*3/uL (ref 4.0–10.5)
nRBC: 0 % (ref 0.0–0.2)

## 2020-12-18 LAB — ECHOCARDIOGRAM COMPLETE
AR max vel: 2.56 cm2
AV Area VTI: 2.41 cm2
AV Area mean vel: 2.26 cm2
AV Mean grad: 2 mmHg
AV Peak grad: 3.1 mmHg
Ao pk vel: 0.88 m/s
Area-P 1/2: 4.57 cm2
Calc EF: 44.1 %
Height: 66 in
MV VTI: 2.05 cm2
S' Lateral: 5 cm
Single Plane A2C EF: 49.3 %
Single Plane A4C EF: 37.6 %
Weight: 3200 oz

## 2020-12-18 LAB — BASIC METABOLIC PANEL
Anion gap: 7 (ref 5–15)
BUN: 18 mg/dL (ref 8–23)
CO2: 28 mmol/L (ref 22–32)
Calcium: 8.6 mg/dL — ABNORMAL LOW (ref 8.9–10.3)
Chloride: 110 mmol/L (ref 98–111)
Creatinine, Ser: 1.55 mg/dL — ABNORMAL HIGH (ref 0.61–1.24)
GFR, Estimated: 50 mL/min — ABNORMAL LOW (ref >60)
Glucose, Bld: 141 mg/dL — ABNORMAL HIGH (ref 70–99)
Potassium: 4 mmol/L (ref 3.5–5.1)
Sodium: 145 mmol/L (ref 135–145)

## 2020-12-18 LAB — GLUCOSE, CAPILLARY
Glucose-Capillary: 117 mg/dL — ABNORMAL HIGH (ref 70–99)
Glucose-Capillary: 129 mg/dL — ABNORMAL HIGH (ref 70–99)

## 2020-12-18 LAB — CBG MONITORING, ED
Glucose-Capillary: 96 mg/dL (ref 70–99)
Glucose-Capillary: 94 mg/dL (ref 70–99)
Glucose-Capillary: 95 mg/dL (ref 70–99)

## 2020-12-18 LAB — MAGNESIUM: Magnesium: 2 mg/dL (ref 1.7–2.4)

## 2020-12-18 LAB — HEPARIN LEVEL (UNFRACTIONATED)
Heparin Unfractionated: >1.1 IU/mL — ABNORMAL HIGH (ref 0.30–0.70)
Heparin Unfractionated: <0.1 IU/mL — ABNORMAL LOW (ref 0.30–0.70)

## 2020-12-18 MED ORDER — ACETAMINOPHEN 325 MG PO TABS
650.0000 mg | ORAL_TABLET | Freq: Four times a day (QID) | ORAL | Status: DC | PRN
  Administered 2020-12-18: 650 mg via ORAL
  Filled 2020-12-18: qty 2

## 2020-12-18 MED ORDER — ENOXAPARIN SODIUM 40 MG/0.4ML IJ SOSY
40.0000 mg | PREFILLED_SYRINGE | INTRAMUSCULAR | Status: DC
  Administered 2020-12-20: 40 mg via SUBCUTANEOUS
  Filled 2020-12-18 (×2): qty 0.4

## 2020-12-18 MED ORDER — ISOSORBIDE MONONITRATE ER 30 MG PO TB24
30.0000 mg | ORAL_TABLET | Freq: Every day | ORAL | Status: DC
  Administered 2020-12-18 – 2020-12-20 (×3): 30 mg via ORAL
  Filled 2020-12-18 (×3): qty 1

## 2020-12-18 MED ORDER — MELATONIN 5 MG PO TABS
5.0000 mg | ORAL_TABLET | Freq: Every day | ORAL | Status: DC
  Administered 2020-12-19: 5 mg via ORAL
  Filled 2020-12-18 (×2): qty 1

## 2020-12-18 MED ORDER — FUROSEMIDE 10 MG/ML IJ SOLN
80.0000 mg | Freq: Two times a day (BID) | INTRAMUSCULAR | Status: DC
  Administered 2020-12-18 – 2020-12-19 (×3): 80 mg via INTRAVENOUS
  Filled 2020-12-18 (×3): qty 8

## 2020-12-18 MED ORDER — POTASSIUM CHLORIDE CRYS ER 20 MEQ PO TBCR
40.0000 meq | EXTENDED_RELEASE_TABLET | Freq: Once | ORAL | Status: AC
  Administered 2020-12-18: 40 meq via ORAL
  Filled 2020-12-18: qty 2

## 2020-12-18 MED ORDER — HEPARIN BOLUS VIA INFUSION
3000.0000 [IU] | Freq: Once | INTRAVENOUS | Status: AC
  Administered 2020-12-18: 3000 [IU] via INTRAVENOUS
  Filled 2020-12-18: qty 3000

## 2020-12-18 MED ORDER — HYDRALAZINE HCL 50 MG PO TABS
50.0000 mg | ORAL_TABLET | Freq: Three times a day (TID) | ORAL | Status: DC
  Administered 2020-12-18 – 2020-12-20 (×8): 50 mg via ORAL
  Filled 2020-12-18 (×3): qty 1
  Filled 2020-12-18: qty 2
  Filled 2020-12-18 (×4): qty 1
  Filled 2020-12-18: qty 2

## 2020-12-18 NOTE — Progress Notes (Signed)
*  PRELIMINARY RESULTS* Echocardiogram 2D Echocardiogram has been performed.  [name] 12/18/2020, 2:10 PM

## 2020-12-18 NOTE — Progress Notes (Signed)
Family Medicine Teaching Service Daily Progress Note Intern Pager: [phone]  Patient name: [name] Medical record number: [id number] Date of birth: 05-12-1955 Age: 65 y.o. Gender: male  Primary Care Provider: [name], MD Consultants: Cards Code Status: Full  Pt Overview and Major Events to Date:  10/15 Admitted  Assessment and Plan: [name] is a 65 y.o. male presenting with chest pain 2/2 demand ischemia from acute heart failure. PMH is significant for HTN, HLD, HFimpEF, NICM, DM2, s/p bilateral BKA, CKD stage III, prior CVA, seizures.  Chest Pain  demand ischemia 2/2 CHF exacerbation Cardiology believes chest pain is not reflective of ACS but rather demand ischemia secondary to CHF exacerbation, recommend increasing Lasix to 80 mg IV twice daily along with adding hydralazine 50 mg 3 times daily and Imdur 30 mg daily for afterload reduction.  Also recommend stop heparin.  - Cardiology on board, appreciate ongoing care and recommendations - Awaiting echo - Continue bisoprolol 5 mg daily - Continue aspirin 81 mg daily - Strict I's and O's, daily weights  Abdominal pain On admission, patient reported mild RLQ pain x3 weeks he believes to be related to constipation. - Continue MiraLAX twice daily and Senokot daily - If continued abdominal pain, would increase bowel regimen and obtain bladder scan - Could consider enema if needed   Type 2 Diabetes with severe complications K9F on admission 6.1, down from 8.0 in April.  Since admission, fingerstick glucose range 96-141. Home meds include Lantus 20 units daily, Januvia 800 mg daily, metformin 1000 mg twice daily; however pending med rec for confirmation. -Continue sensitive sliding scale insulin - CBGs 4 times daily before meals and at bedtime - Consider basal Semglee 10 units while inpatient, however fingerstick sugars do not indicate need for basal at this time   HTN BP continues to be elevated, last 24 hours  systolics 818-299, diastolics 37-169.  Suspect worsened by fluid overload from heart failure exacerbation.  Cardiology recommendations and med changes as above and CHF A&P. - Bisoprolol 5 mg daily - Lasix 80 mg IV twice daily - Hydralazine 50 mg 3 times daily - Imdur 30 mg daily   CKD Stage IIIB Creatinine today 1.55, stable from 1.47 yesterday.  Baseline thought to be 1.2-1.3. - Daily BMP   Hx of seizures No recent seizure activity. Awaiting formal med rec- appears home meds include Keppra 750mg  BID and Depakote 125mg  BID. -Continue home Keppra and Depakote -Consider Depakote level given unknown if patient truly taking this dose at home   HLD Lipid panel on admission shows well-controlled hyperlipidemia.  LDL 49, total cholesterol 106.  Continue atorvastatin 40 mg daily.   FEN/GI: Heart healthy carb modified PPx: Lovenox 40 mg  Dispo:Home pending clinical improvement . Barriers include continued medical work up and stabilization.   Subjective:  [name] was found laying in bed comfortably in no acute distress.  Had no complaints.  All questions answered.  Objective: Temp:  [97.4 F (36.3 C)-98.8 F (37.1 C)] 97.4 F (36.3 C) (10/16 0805) Pulse Rate:  [68-88] 74 (10/16 0900) Resp:  [11-33] 24 (10/16 0900) BP: (137-182)/(91-131) 153/112 (10/16 0900) SpO2:  [92 %-100 %] 99 % (10/16 0900) Weight:  [90.7 kg] 90.7 kg (10/15 2011) Physical Exam: General: Awake, alert, no acute distress Cardiovascular: Regular rate and rhythm, no murmur appreciated Respiratory: Clear to auscultation bilaterally Abdomen: Obese abdomen, not appreciably distended Extremities: S/p bilateral BKA  Laboratory: Recent Labs  Lab 12/17/20 1400 12/18/20 0430  WBC 8.6  8.4  HGB 12.0* 12.3*  HCT 40.1 39.9  PLT 352 340   Recent Labs  Lab 12/17/20 1400 12/18/20 0430  NA 143 145  K 3.6 4.0  CL 110 110  CO2 27 28  BUN 16 18  CREATININE 1.47* 1.55*  CALCIUM 8.6* 8.6*  GLUCOSE 122* 141*     Imaging/Diagnostic Tests: CHEST - 2 VIEW 12/17/20 CLINICAL DATA:  Chest pain, fall 2 days ago COMPARISON:  02/05/2017 FINDINGS: Gross cardiomegaly. Mild, diffuse bilateral interstitial pulmonary opacity and small bilateral pleural effusions. Osseous structures are unremarkable. IMPRESSION: Cardiomegaly with mild, diffuse bilateral interstitial pulmonary opacity and small bilateral pleural effusions, likely edema.  Jake Essex, MD 12/18/2020, 9:37 AM PGY-2, [place] Intern pager: [phone], text pages welcome

## 2020-12-18 NOTE — ED Notes (Signed)
Pt c/o abd pain, requesting something for pain. Pt admits to being constipated for the past 3 days. On call hospitalist at bedside assessing pt's pain. Will attempt bedpan for bowel movmnt, before opting for enema.

## 2020-12-18 NOTE — Plan of Care (Signed)
  Problem: Education: Goal: Knowledge of General Education information will improve Description Including pain rating scale, medication(s)/side effects and non-pharmacologic comfort measures Outcome: Progressing   Problem: Elimination: Goal: Will not experience complications related to bowel motility Outcome: Progressing Goal: Will not experience complications related to urinary retention Outcome: Progressing   Problem: Safety: Goal: Ability to remain free from injury will improve Outcome: Progressing   Problem: Skin Integrity: Goal: Risk for impaired skin integrity will decrease Outcome: Progressing   

## 2020-12-18 NOTE — Progress Notes (Signed)
FPTS Interim Progress Note  S: Paged by patient's nurse regarding abdominal pain.  She reports that it has been going on all night and it comes and goes.  I evaluated the patient at bedside and he reports that it feels like he has to have a bowel movement but he does not know if he can actually have 1.  We discussed using a bedpan and he does not think he can use 1 of those.  Reports he is peeing well.  O: BP (!) 169/106   Pulse 76   Temp 98 F (36.7 C) (Oral)   Resp 19   Ht 5\' 6"  (1.676 m)   Wt 90.7 kg   SpO2 97%   BMI 32.28 kg/m   General: Laying in bed, intermittently uncomfortable Respiratory: Normal work of breathing Abdomen: Mildly distended, reports when he has the pain it is mainly suprapubic.  A/P: Abdominal pain. Per nurse the patient reports he has not had a bowel movement in 2-3 days.  She he was just given MiraLAX and docusate.  Will monitor and encourage him to attempt to have a bowel movement.  Patient's nurse reports that the patient has been urinating without difficulty but if this persist consider bladder scans to ensure he is adequately emptying his bladder.  He also may need an enema to assist with constipation.   Gifford Shave, MD 12/18/2020, 1:37 AM PGY-3, [facility] Service pager [phone]

## 2020-12-18 NOTE — Progress Notes (Signed)
Cardiology Progress Note  Patient ID: [name] MRN: [id number] DOB: [birth year] Date of Encounter: 12/18/2020  Primary Cardiologist: None  Subjective   Chief Complaint: SOB  HPI: Grossly volume overloaded. BP still elevated. Troponin flat. Chest pain resolved.   ROS:  All other ROS reviewed and negative. Pertinent positives noted in the HPI.     Inpatient Medications  Scheduled Meds:  artificial tears  1 application Both Eyes QHS   aspirin EC  81 mg Oral Daily   atorvastatin  40 mg Oral QHS   bisoprolol  5 mg Oral Daily   brimonidine  1 drop Left Eye BID   And   brimonidine  1 drop Right Eye TID   divalproex  125 mg Oral BID   dorzolamide-timolol  1 drop Left Eye BID   insulin aspart  0-9 Units Subcutaneous TID WC   levETIRAcetam  750 mg Oral BID   Netarsudil Dimesylate  1 drop Left Eye QHS   pantoprazole  20 mg Oral QAC breakfast   polyethylene glycol  17 g Oral BID   senna-docusate  1 tablet Oral QHS   sertraline  50 mg Oral q AM   tamsulosin  0.8 mg Oral QPC supper   Continuous Infusions:  heparin 1,500 Units/hr (12/18/20 0412)   PRN Meds: acetaminophen, polyvinyl alcohol   Vital Signs   Vitals:   12/18/20 0500 12/18/20 0600 12/18/20 0700 12/18/20 0805  BP: (!) 155/114 (!) 151/112 (!) 147/105 (!) 149/109  Pulse: 75 71 68 72  Resp: 11  (!) 23 (!) 25  Temp:    (!) 97.4 F (36.3 C)  TempSrc:    Oral  SpO2: 100% 100% 98% 95%  Weight:      Height:       No intake or output data in the 24 hours ending 12/18/20 0852 Last 3 Weights 12/17/2020 06/26/2020 06/25/2020  Weight (lbs) 200 lb 200 lb 13.4 oz 199 lb 8.3 oz  Weight (kg) 90.719 kg 91.1 kg 90.5 kg      Telemetry  Overnight telemetry shows sinus rhythm in the 70s, which I personally reviewed.   ECG  The most recent ECG shows SR 75, PACs, LVH with repolarization normality, which I personally reviewed.   Physical Exam   Vitals:   12/18/20 0500 12/18/20 0600 12/18/20 0700 12/18/20 0805  BP: (!)  155/114 (!) 151/112 (!) 147/105 (!) 149/109  Pulse: 75 71 68 72  Resp: 11  (!) 23 (!) 25  Temp:    (!) 97.4 F (36.3 C)  TempSrc:    Oral  SpO2: 100% 100% 98% 95%  Weight:      Height:       No intake or output data in the 24 hours ending 12/18/20 0852  Last 3 Weights 12/17/2020 06/26/2020 06/25/2020  Weight (lbs) 200 lb 200 lb 13.4 oz 199 lb 8.3 oz  Weight (kg) 90.719 kg 91.1 kg 90.5 kg    Body mass index is 32.28 kg/m.  General: Tachypnea noted Head: Atraumatic, normal size  Eyes: PEERLA, EOMI  Neck: Supple, JVD up to earlobes Endocrine: No thryomegaly Cardiac: Normal S1, S2; RRR; no murmurs, rubs, or gallops Lungs: Crackles at lung bases Abd: Distended abdomen Ext: Status post bilateral BKA, no edema Musculoskeletal: Bilateral BKA Skin: Warm and dry, no rashes   Neuro: Alert and oriented to person, place, time, and situation, CNII-XII grossly intact, no focal deficits  Psych: Normal mood and affect   Labs  High Sensitivity Troponin:  Recent Labs  Lab 12/17/20 1400 12/17/20 1609  TROPONINIHS 273* 275*     Cardiac EnzymesNo results for input(s): TROPONINI in the last 168 hours. No results for input(s): TROPIPOC in the last 168 hours.  Chemistry Recent Labs  Lab 12/17/20 1400 12/18/20 0430  NA 143 145  K 3.6 4.0  CL 110 110  CO2 27 28  GLUCOSE 122* 141*  BUN 16 18  CREATININE 1.47* 1.55*  CALCIUM 8.6* 8.6*  GFRNONAA 53* 50*  ANIONGAP 6 7    Hematology Recent Labs  Lab 12/17/20 1400 12/18/20 0430  WBC 8.6 8.4  RBC 4.61 4.64  HGB 12.0* 12.3*  HCT 40.1 39.9  MCV 87.0 86.0  MCH 26.0 26.5  MCHC 29.9* 30.8  RDW 16.1* 16.0*  PLT 352 340   BNP Recent Labs  Lab 12/17/20 2015  BNP 2,244.0*    DDimer No results for input(s): DDIMER in the last 168 hours.   Radiology  DG Chest 2 View  Result Date: 12/17/2020 CLINICAL DATA:  Chest pain, fall 2 days ago EXAM: CHEST - 2 VIEW COMPARISON:  02/05/2017 FINDINGS: Gross cardiomegaly. Mild, diffuse bilateral  interstitial pulmonary opacity and small bilateral pleural effusions. Osseous structures are unremarkable. IMPRESSION: Cardiomegaly with mild, diffuse bilateral interstitial pulmonary opacity and small bilateral pleural effusions, likely edema. Electronically Signed   By: Delanna Ahmadi M.D.   On: 12/17/2020 14:15    Cardiac Studies  TTE 06/23/2020  1. Inferior basal hypokinesis . Left ventricular ejection fraction, by  estimation, is 50 to 55%. The left ventricle has low normal function. The  left ventricle demonstrates regional wall motion abnormalities (see  scoring diagram/findings for  description). The left ventricular internal cavity size was mildly  dilated. There is mild left ventricular hypertrophy. Left ventricular  diastolic parameters were normal.   2. Right ventricular systolic function is normal. The right ventricular  size is normal. There is normal pulmonary artery systolic pressure.   3. A small pericardial effusion is present. The pericardial effusion is  posterior to the left ventricle and lateral to the left ventricle.   4. The mitral valve is abnormal. Trivial mitral valve regurgitation. No  evidence of mitral stenosis.   5. Calcification of the right and non coronary commisure . The aortic  valve is tricuspid. There is moderate calcification of the aortic valve.  Aortic valve regurgitation is trivial. Mild to moderate aortic valve  sclerosis/calcification is present,  without any evidence of aortic stenosis.   6. The inferior vena cava is normal in size with greater than 50%  respiratory variability, suggesting right atrial pressure of 3 mmHg.   LHC 2017 Mid LAD lesion, 20% stenosed.  Patient Profile  [name] is a 65 y.o. male with systolic heart failure with recovered ejection fraction, PAD status post bilateral BKA, hypertension, CKD stage IIIa who was admitted on 12/17/2020 with acute on chronic left suspected systolic heart failure.  Assessment & Plan    #Acute on chronic systolic heart failure #Recovery of EF in the past -He presents with uncontrolled hypertension and gross volume overload.  BNP 2244.  His EF is undoubtably low.  Received 40 mg of Lasix as a one-time dose.  We will increase this to 80 mg IV twice daily. -Echo is pending but given elevation in BNP which is very elevated I suspect his EF is low again.  Continue bisoprolol 5 mg daily.  He is not hypotensive.  No narrow pulse pressure to suggest cardiogenic shock.  I  think it is okay to continue beta-blocker.  He does have CKD stage III.  We will add hydralazine 50 mg 3 times daily.  Also Imdur 30 mg daily.  Would be cautious with ACE/ARB/Arni/MRA given CKD stage III.  Kidney function improves we could consider adding these agents.  For now we will pursue Imdur hydralazine for afterload reduction. -Left heart cath in 2017 with nonobstructive CAD.  Troponins are elevated but flat.  EKG really shows LVH changes.  I suspect this is all demand in the setting of systolic heart failure.  Stopping heparin. -echo pending.  Recheck TSH tomorrow. -add SGLT2 likely tomorrow  #Chest Pain #Non-obstructive CAD #PAD #HLD -Reports chest pain and trouble breathing.  Grossly volume overloaded.  Suspect his symptoms are more heart failure related.  EKG shows sinus rhythm with LVH and repolarization abnormality. -Initial troponin 273 and 275 on repeat.  This is flat and consistent with an acute coronary syndrome.  Symptoms have improved.  He did have a left heart catheterization in 2017 that showed nonobstructive and minimal CAD. -To me this does not represent acute coronary syndrome.  Likely demand in the setting of acute systolic heart failure.  Stop heparin.  Continue aspirin and statin for known nonobstructive CAD and PAD. -He may ultimately end up needing repeat left and right heart catheterization but this will not be for an acute coronary syndrome.  This will be for likely systolic heart  failure. -Lipids at goal.  #CKD IIIA -Close monitoring of kidney function.  For questions or updates, please contact [place] Please consult [email or web address] for contact info under   Time Spent with Patient: I have spent a total of 35 minutes with patient reviewing hospital notes, telemetry, EKGs, labs and examining the patient as well as establishing an assessment and plan that was discussed with the patient.  > 50% of time was spent in direct patient care.    Signed, Addison Naegeli. Audie Box, MD, [place]  12/18/2020 8:52 AM

## 2020-12-18 NOTE — ED Notes (Signed)
2 soft textured brown colored BM at this time.

## 2020-12-18 NOTE — Progress Notes (Signed)
ANTICOAGULATION CONSULT NOTE - Follow Up Consult  Pharmacy Consult for heparin Indication:  elevated troponin  Labs: Recent Labs    12/17/20 1400 12/17/20 1609 12/18/20 0135 12/18/20 0210  HGB 12.0*  --   --   --   HCT 40.1  --   --   --   PLT 352  --   --   --   HEPARINUNFRC  --   --  >1.10* <0.10*  CREATININE 1.47*  --   --   --   TROPONINIHS 273* 275*  --   --     Assessment: 65yo male subtherapeutic on heparin with initial dosing for elevated troponin; no infusion issues or signs of bleeding per RN.  Goal of Therapy:  Heparin level 0.3-0.7 units/ml   Plan:  Will rebolus with heparin 3000 units and increase heparin infusion by 3-4 units/kg/hr to 1500 units/hr and check level in 6 hours.    Wynona Neat, PharmD, BCPS  12/18/2020,3:47 AM

## 2020-12-18 NOTE — Progress Notes (Signed)
FPTS Brief Progress Note  S: Patient remains in the emergency department.  Nightly rounds completed and patient was sleeping comfortably.  Spoke with nurse who reports everything is going well and she just checked his blood sugar and it was 99.  No concerns at this time.   O: BP (!) 182/129   Pulse 78   Temp 98 F (36.7 C) (Oral)   Resp 17   Ht 5\' 6"  (1.676 m)   Wt 90.7 kg   SpO2 99%   BMI 32.28 kg/m   General: Sleeping comfortably in bed, did not disturb Respiratory: Normal work of breathing   A/P: We will continue plan per day team.  Patient is doing well at this time although remains hypertensive.  BNP came back elevated at 2244.  Cardiology has been consulted and repeat echocardiogram has been ordered.  We will continue to monitor and make adjustments if needed. - Orders reviewed. Labs for AM ordered, which was adjusted as needed.    Gifford Shave, MD 12/18/2020, 1:08 AM PGY-3, [facility] Night Resident  Please page [phone] with questions.

## 2020-12-18 NOTE — ED Notes (Signed)
Pt pulled out both of IV he had in LFA and LAC. Pt states, "It was hurting me and I don't need it anymore." Currently with echo at bedside. Will place another IV after echo. Will continue to monitor.

## 2020-12-19 DIAGNOSIS — I2510 Atherosclerotic heart disease of native coronary artery without angina pectoris: Secondary | ICD-10-CM | POA: Diagnosis present

## 2020-12-19 DIAGNOSIS — R778 Other specified abnormalities of plasma proteins: Secondary | ICD-10-CM

## 2020-12-19 DIAGNOSIS — I5082 Biventricular heart failure: Secondary | ICD-10-CM | POA: Diagnosis present

## 2020-12-19 DIAGNOSIS — E1140 Type 2 diabetes mellitus with diabetic neuropathy, unspecified: Secondary | ICD-10-CM | POA: Diagnosis present

## 2020-12-19 DIAGNOSIS — I5043 Acute on chronic combined systolic (congestive) and diastolic (congestive) heart failure: Secondary | ICD-10-CM | POA: Diagnosis present

## 2020-12-19 DIAGNOSIS — Z809 Family history of malignant neoplasm, unspecified: Secondary | ICD-10-CM | POA: Diagnosis not present

## 2020-12-19 DIAGNOSIS — I493 Ventricular premature depolarization: Secondary | ICD-10-CM | POA: Diagnosis present

## 2020-12-19 DIAGNOSIS — Z79899 Other long term (current) drug therapy: Secondary | ICD-10-CM | POA: Diagnosis not present

## 2020-12-19 DIAGNOSIS — I248 Other forms of acute ischemic heart disease: Secondary | ICD-10-CM | POA: Diagnosis present

## 2020-12-19 DIAGNOSIS — G40909 Epilepsy, unspecified, not intractable, without status epilepticus: Secondary | ICD-10-CM | POA: Diagnosis present

## 2020-12-19 DIAGNOSIS — I10 Essential (primary) hypertension: Secondary | ICD-10-CM

## 2020-12-19 DIAGNOSIS — E7800 Pure hypercholesterolemia, unspecified: Secondary | ICD-10-CM | POA: Diagnosis present

## 2020-12-19 DIAGNOSIS — N179 Acute kidney failure, unspecified: Secondary | ICD-10-CM | POA: Diagnosis present

## 2020-12-19 DIAGNOSIS — R072 Precordial pain: Secondary | ICD-10-CM | POA: Diagnosis present

## 2020-12-19 DIAGNOSIS — Z20822 Contact with and (suspected) exposure to covid-19: Secondary | ICD-10-CM | POA: Diagnosis present

## 2020-12-19 DIAGNOSIS — Z7982 Long term (current) use of aspirin: Secondary | ICD-10-CM | POA: Diagnosis not present

## 2020-12-19 DIAGNOSIS — N401 Enlarged prostate with lower urinary tract symptoms: Secondary | ICD-10-CM | POA: Diagnosis present

## 2020-12-19 DIAGNOSIS — I071 Rheumatic tricuspid insufficiency: Secondary | ICD-10-CM | POA: Diagnosis present

## 2020-12-19 DIAGNOSIS — I272 Pulmonary hypertension, unspecified: Secondary | ICD-10-CM | POA: Diagnosis present

## 2020-12-19 DIAGNOSIS — Z7984 Long term (current) use of oral hypoglycemic drugs: Secondary | ICD-10-CM | POA: Diagnosis not present

## 2020-12-19 DIAGNOSIS — N1831 Chronic kidney disease, stage 3a: Secondary | ICD-10-CM

## 2020-12-19 DIAGNOSIS — E1151 Type 2 diabetes mellitus with diabetic peripheral angiopathy without gangrene: Secondary | ICD-10-CM | POA: Diagnosis present

## 2020-12-19 DIAGNOSIS — I519 Heart disease, unspecified: Secondary | ICD-10-CM

## 2020-12-19 DIAGNOSIS — K5900 Constipation, unspecified: Secondary | ICD-10-CM | POA: Diagnosis present

## 2020-12-19 DIAGNOSIS — I130 Hypertensive heart and chronic kidney disease with heart failure and stage 1 through stage 4 chronic kidney disease, or unspecified chronic kidney disease: Secondary | ICD-10-CM | POA: Diagnosis not present

## 2020-12-19 DIAGNOSIS — H5462 Unqualified visual loss, left eye, normal vision right eye: Secondary | ICD-10-CM | POA: Diagnosis present

## 2020-12-19 DIAGNOSIS — I420 Dilated cardiomyopathy: Secondary | ICD-10-CM | POA: Diagnosis present

## 2020-12-19 DIAGNOSIS — E1122 Type 2 diabetes mellitus with diabetic chronic kidney disease: Secondary | ICD-10-CM | POA: Diagnosis present

## 2020-12-19 LAB — BASIC METABOLIC PANEL
Anion gap: 6 (ref 5–15)
BUN: 25 mg/dL — ABNORMAL HIGH (ref 8–23)
CO2: 26 mmol/L (ref 22–32)
Calcium: 8.6 mg/dL — ABNORMAL LOW (ref 8.9–10.3)
Chloride: 106 mmol/L (ref 98–111)
Creatinine, Ser: 1.67 mg/dL — ABNORMAL HIGH (ref 0.61–1.24)
GFR, Estimated: 45 mL/min — ABNORMAL LOW (ref >60)
Glucose, Bld: 150 mg/dL — ABNORMAL HIGH (ref 70–99)
Potassium: 4.1 mmol/L (ref 3.5–5.1)
Sodium: 138 mmol/L (ref 135–145)

## 2020-12-19 LAB — GLUCOSE, CAPILLARY
Glucose-Capillary: 113 mg/dL — ABNORMAL HIGH (ref 70–99)
Glucose-Capillary: 105 mg/dL — ABNORMAL HIGH (ref 70–99)
Glucose-Capillary: 112 mg/dL — ABNORMAL HIGH (ref 70–99)
Glucose-Capillary: 129 mg/dL — ABNORMAL HIGH (ref 70–99)

## 2020-12-19 LAB — MAGNESIUM: Magnesium: 1.9 mg/dL (ref 1.7–2.4)

## 2020-12-19 LAB — MRSA NEXT GEN BY PCR, NASAL: MRSA by PCR Next Gen: NOT DETECTED

## 2020-12-19 LAB — TSH: TSH: 1.129 u[IU]/mL (ref 0.350–4.500)

## 2020-12-19 MED ORDER — FUROSEMIDE 40 MG PO TABS
40.0000 mg | ORAL_TABLET | Freq: Every day | ORAL | Status: DC
  Administered 2020-12-20: 40 mg via ORAL
  Filled 2020-12-19: qty 1

## 2020-12-19 NOTE — Progress Notes (Addendum)
Family Medicine Teaching Service Daily Progress Note Intern Pager: [phone]  Patient name: [name] Medical record number: [id number] Date of birth: Mar 01, 1956 Age: 65 y.o. Gender: male  Primary Care Provider: [name], MD Consultants: cardiology Code Status: FULL  Pt Overview and Major Events to Date:  10/15: admitted  Assessment and Plan: [name] is a 65 y.o. male presenting with chest pain. PMH is significant for HTN, HLD, HFimpEF, NICM, DM2, s/p bilateral BKA, CKD stage III, prior CVA, seizures.  Chest pain, NSTEMI, Likely 2/2 HF exacerbation BNP of 2.2K, CXR with pulmonary edema. Echo showing worsening EF to 20-25% (prev 50-55%), G3DD (prev normal), mod to severe TR. Afterload reduction with hydral added (Imdur as well). Heparin stopped. Patient appears improved from a volume perspective, much reduced ABD distention, clear lung sounds. Still has JVD (may be a constant given TR). In terms of discharge planning, he does not have a known oral diuretic regimen. Could go on 40-80 mg PO of Lasix daily. It would be ideal for patient to be on an SGLT2 and ARB/ARNI but this could be done outpatient.  -Cardiology on board, appreciate ongoing care and recommendations -Lasix 80 mg IV x1, PO 40-80 mg daily thereafter -Continue bisoprolol 5 mg daily -Continue aspirin 81 mg daily -Strict I's and O's, daily weights  Abdominal pain On admission, patient reported mild RLQ pain x3 weeks he believes to be related to constipation. Today, pain improved, with no tenderness.  - Continue MiraLAX twice daily and Senokot daily - If continued abdominal pain, can increase bowel regimen and obtain bladder scan   Type 2 Diabetes with severe complications Y7C on admission 6.1, down from 8.0 in April.  Since admission, CBGs in the 100's. Home meds include Lantus 20 units daily, Januvia 800 mg daily, metformin 1000 mg twice daily.  - Continue sensitive sliding scale insulin - CBGs 4 times daily  before meals and at bedtime   HTN SBP from 140-155.  - Bisoprolol 5 mg daily - Hydralazine 50 mg 3 times daily, added by cardiology - Imdur 30 mg daily, added by cardiology   CKD Stage IIIB Creatinine increasing with diuresis. Up to 1.67 this AM.  Baseline thought to be 1.2-1.3. - Daily BMP   Hx of seizures No recent seizure activity. On home Keppra 750mg  BID and Depakote 125mg  BID. -Continue home Keppra and Depakote   HLD Lipid panel on admission shows well-controlled hyperlipidemia.  LDL 49, total cholesterol 106.  Continue atorvastatin 40 mg daily.   FEN/GI: Heart healthy carb modified PPx: Lovenox 40 mg  Dispo:Home pending clinical improvement . Barriers include continued medical work up and stabilization.   Subjective:  On interview this morning patient denies SoB and CP. He reports urinating frequently though does not seem too bothered by this. He reports lessening ABD pain and swelling.   Objective: Temp:  [97.4 F (36.3 C)-98 F (36.7 C)] 97.8 F (36.6 C) (10/17 0729) Pulse Rate:  [54-74] 70 (10/17 0429) Resp:  [14-24] 19 (10/17 0729) BP: (102-155)/(70-112) 147/95 (10/17 0729) SpO2:  [90 %-100 %] 98 % (10/17 0729) Weight:  [207 lb 3.7 oz (94 kg)] 207 lb 3.7 oz (94 kg) (10/17 0429) Physical Exam Vitals reviewed.  Cardiovascular:     Rate and Rhythm: Normal rate and regular rhythm.     Heart sounds: Murmur heard.  Systolic murmur is present.     Comments: Some JVD present Pulmonary:     Effort: Pulmonary effort is normal.  Breath sounds: Normal breath sounds.  Abdominal:     General: Bowel sounds are normal.     Palpations: Abdomen is soft.     Tenderness: There is no abdominal tenderness.  Skin:    General: Skin is warm and dry.  Neurological:     Mental Status: He is alert.     Laboratory: Recent Labs  Lab 12/17/20 1400 12/18/20 0430  WBC 8.6 8.4  HGB 12.0* 12.3*  HCT 40.1 39.9  PLT 352 340   Recent Labs  Lab 12/17/20 1400 12/18/20 0430   NA 143 145  K 3.6 4.0  CL 110 110  CO2 27 28  BUN 16 18  CREATININE 1.47* 1.55*  CALCIUM 8.6* 8.6*  GLUCOSE 122* 141*    Imaging/Diagnostic Tests: Echo as above   Corky Sox, MD PGY-1 [facility] Intern pager: [phone], text pages welcome

## 2020-12-19 NOTE — Hospital Course (Addendum)
[name] is a 65 y.o. male presenting with chest pain. PMH is significant for HTN, HLD, HFimpEF, NICM, DM2, s/p bilateral BKA, CKD stage III, prior CVA, seizures.  Chest pain, NSTEMI, Likely 2/2 HF exacerbation Patient presented with sharp, substernal chest pain x3-4 days that occurred after eating, not worsened by exertion. Patient also reported worsening orthopnea and paroxysmal nocturnal dyspnea and a weight gain of 10-15 lbs in his abdomen. Workup remarkable for elevated troponin to 273->275. EKG without acute ST changes. CXR with diffuse opacities, likely pulmonary edema. BNP of 2.2K. His ACS was thought to be demand ischemia in the context of HF exacerbation. He was started on a heparin drip and diuresed with lasix 40 mg IV initially, then 80 mg IV BID, then 80 mg IV x1. He was then transitioned to an oral regimen of 40 mg PO Lasix daily. Of note an echo was obtained showing worsening EF to 20-25% (prev 50-55%), G3DD (prev normal), mod to severe TR. Afterload reduction with hydral added (Imdur as well). On discharge he was felt to be fairly euvolemic with clear lung sounds and minimal JVD and lower extremity/dependent edema.   Abdominal pain On admission, patient reported mild RLQ pain x3 weeks he believes to be related to constipation. Pain improved, with no tenderness. He reported a BM on admission and no more ABD pain. Continued home MiraLAX twice daily and Senokot daily.   Type 2 Diabetes with severe complications S0S on admission 6.1, down from 8.0 in April.  Since admission, fingerstick glucose ranged in the mid to low 100's Home meds include Lantus 20 units daily, Januvia 800 mg daily, metformin 1000 mg twice daily. He was maintained on sensitive sliding scale insulin alone with adequate glycemic control. He was discharged with instruction to discontinue his glargine but continue his other home medications.    HTN Continued home anti-HTN regimen: Bisoprolol 5 mg daily, and added  Hydralazine 50 mg 3 times daily, Imdur 30 mg daily.    CKD Stage IIIB Baseline Cr thought to be 1.2-1.3. Cr up to a peak of 1.67. Down to 1.52 on discharge, with the expectation that this will continue to fall down to baseline.    Hx of seizures No recent seizure activity. Continued home meds Keppra 750 mg BID and Depakote 125 mg BID. -Continue home Keppra and Depakote   HLD Lipid panel on admission shows well-controlled hyperlipidemia.  LDL 49, total cholesterol 106.  Continued atorvastatin 40 mg daily.

## 2020-12-19 NOTE — Progress Notes (Signed)
Progress Note  Patient Name: [name] Date of Encounter: 12/19/2020  [facility] Cardiologist: Werner Lean, MD   Subjective   No acute events overnight. Breathing comfortably on room air. No chest pain.  Inpatient Medications    Scheduled Meds:  artificial tears  1 application Both Eyes QHS   aspirin EC  81 mg Oral Daily   atorvastatin  40 mg Oral QHS   bisoprolol  5 mg Oral Daily   brimonidine  1 drop Left Eye BID   And   brimonidine  1 drop Right Eye TID   divalproex  125 mg Oral BID   dorzolamide-timolol  1 drop Left Eye BID   enoxaparin (LOVENOX) injection  40 mg Subcutaneous Q24H   [START ON 12/20/2020] furosemide  40 mg Oral Daily   hydrALAZINE  50 mg Oral Q8H   insulin aspart  0-9 Units Subcutaneous TID WC   isosorbide mononitrate  30 mg Oral Daily   levETIRAcetam  750 mg Oral BID   melatonin  5 mg Oral QHS   Netarsudil Dimesylate  1 drop Left Eye QHS   pantoprazole  20 mg Oral QAC breakfast   polyethylene glycol  17 g Oral BID   senna-docusate  1 tablet Oral QHS   sertraline  50 mg Oral q AM   tamsulosin  0.8 mg Oral QPC supper   Continuous Infusions:  PRN Meds: acetaminophen, polyvinyl alcohol   Vital Signs    Vitals:   12/19/20 0429 12/19/20 0636 12/19/20 0729 12/19/20 0800  BP: (!) 146/104 (!) 155/111 (!) 147/95   Pulse: 70     Resp: 20  19   Temp: 97.8 F (36.6 C)  97.8 F (36.6 C)   TempSrc: Oral  Oral   SpO2: 99%  98% 98%  Weight: 94 kg     Height:        Intake/Output Summary (Last 24 hours) at 12/19/2020 1031 Last data filed at 12/19/2020 0730 Gross per 24 hour  Intake 596 ml  Output 1700 ml  Net -1104 ml   Last 3 Weights 12/19/2020 12/17/2020 06/26/2020  Weight (lbs) 207 lb 3.7 oz 200 lb 200 lb 13.4 oz  Weight (kg) 94 kg 90.719 kg 91.1 kg      Telemetry    NSR - Personally Reviewed  ECG    No new since 10/15 - Personally Reviewed  Physical Exam   GEN: No acute distress.   Neck: JVD to mid neck at  90 degrees  Cardiac: RRR, no murmurs, rubs, or gallops.  Respiratory: Clear to auscultation bilaterally. GI: Soft, nontender, non-distended  MS: No edema; s/p bilateral BKA Neuro:  Nonfocal  Psych: Normal affect   Labs    High Sensitivity Troponin:   Recent Labs  Lab 12/17/20 1400 12/17/20 1609  TROPONINIHS 273* 275*     Chemistry Recent Labs  Lab 12/17/20 1400 12/18/20 0430 12/19/20 0825  NA 143 145 138  K 3.6 4.0 4.1  CL 110 110 106  CO2 27 28 26   GLUCOSE 122* 141* 150*  BUN 16 18 25*  CREATININE 1.47* 1.55* 1.67*  CALCIUM 8.6* 8.6* 8.6*  MG  --  2.0 1.9  GFRNONAA 53* 50* 45*  ANIONGAP 6 7 6     Lipids  Recent Labs  Lab 12/17/20 2015  CHOL 106  TRIG 45  HDL 48  LDLCALC 49  CHOLHDL 2.2    Hematology Recent Labs  Lab 12/17/20 1400 12/18/20 0430  WBC 8.6 8.4  RBC 4.61 4.64  HGB 12.0* 12.3*  HCT 40.1 39.9  MCV 87.0 86.0  MCH 26.0 26.5  MCHC 29.9* 30.8  RDW 16.1* 16.0*  PLT 352 340   Thyroid  Recent Labs  Lab 12/19/20 0825  TSH 1.129    BNP Recent Labs  Lab 12/17/20 2015  BNP 2,244.0*    DDimer No results for input(s): DDIMER in the last 168 hours.   Radiology    DG Chest 2 View  Result Date: 12/17/2020 CLINICAL DATA:  Chest pain, fall 2 days ago EXAM: CHEST - 2 VIEW COMPARISON:  02/05/2017 FINDINGS: Gross cardiomegaly. Mild, diffuse bilateral interstitial pulmonary opacity and small bilateral pleural effusions. Osseous structures are unremarkable. IMPRESSION: Cardiomegaly with mild, diffuse bilateral interstitial pulmonary opacity and small bilateral pleural effusions, likely edema. Electronically Signed   By: Delanna Ahmadi M.D.   On: 12/17/2020 14:15   ECHOCARDIOGRAM COMPLETE  Result Date: 12/18/2020    ECHOCARDIOGRAM REPORT   Patient Name:   [name] Date of Exam: 12/18/2020 Medical Rec #:  [id number]       Height:       66.0 in Accession #:    [id number]      Weight:       200.0 lb Date of Birth:  10-20-1955       BSA:           2.000 m Patient Age:    65 years        BP:           138/100 mmHg Patient Gender: M               HR:           69 bpm. Exam Location:  Inpatient Procedure: 2D Echo, Cardiac Doppler and Color Doppler Indications:    CHF-ACUTE SYSTOLIC  History:        Patient has prior history of Echocardiogram examinations, most                 recent 06/23/2020. CHF, PAD; Risk Factors:Diabetes and                 Hypertension. Dilated Cardiomyopathy.  Sonographer:    Wenda Low Referring Phys: [place]  1. Left ventricular ejection fraction, by estimation, is 20 to 25%. The left ventricle has severely decreased function. The left ventricle demonstrates global hypokinesis. There is mild concentric left ventricular hypertrophy. Left ventricular diastolic  parameters are consistent with Grade III diastolic dysfunction (restrictive). Elevated left ventricular end-diastolic pressure. The E/e' is 29.6.  2. Right ventricular systolic function is moderately reduced. The right ventricular size is severely enlarged. There is moderately elevated pulmonary artery systolic pressure. The estimated right ventricular systolic pressure is 74.1 mmHg.  3. Left atrial size was mildly dilated.  4. Right atrial size was mild to moderately dilated.  5. The mitral valve is grossly normal. Mild mitral valve regurgitation. No evidence of mitral stenosis.  6. Tricuspid valve regurgitation is moderate to severe.  7. The aortic valve is tricuspid. Aortic valve regurgitation is not visualized. No aortic stenosis is present.  8. The inferior vena cava is dilated in size with >50% respiratory variability, suggesting right atrial pressure of 8 mmHg. Comparison(s): Changes from prior study are noted. The left ventricular function is significantly worse. FINDINGS  Left Ventricle: Left ventricular ejection fraction, by estimation, is 20 to 25%. The left ventricle has severely decreased function. The left ventricle demonstrates  global hypokinesis.  The left ventricular internal cavity size was normal in size. There is mild concentric left ventricular hypertrophy. Left ventricular diastolic parameters are consistent with Grade III diastolic dysfunction (restrictive). Elevated left ventricular end-diastolic pressure. The E/e' is 29.6. Right Ventricle: The right ventricular size is severely enlarged. No increase in right ventricular wall thickness. Right ventricular systolic function is moderately reduced. There is moderately elevated pulmonary artery systolic pressure. The tricuspid regurgitant velocity is 3.41 m/s, and with an assumed right atrial pressure of 8 mmHg, the estimated right ventricular systolic pressure is 72.6 mmHg. Left Atrium: Left atrial size was mildly dilated. Right Atrium: Right atrial size was mild to moderately dilated. Pericardium: Trivial pericardial effusion is present. Mitral Valve: The mitral valve is grossly normal. Mild mitral valve regurgitation. No evidence of mitral valve stenosis. MV peak gradient, 4.1 mmHg. The mean mitral valve gradient is 1.0 mmHg. Tricuspid Valve: The tricuspid valve is grossly normal. Tricuspid valve regurgitation is moderate to severe. No evidence of tricuspid stenosis. Aortic Valve: The aortic valve is tricuspid. Aortic valve regurgitation is not visualized. No aortic stenosis is present. Aortic valve mean gradient measures 2.0 mmHg. Aortic valve peak gradient measures 3.1 mmHg. Aortic valve area, by VTI measures 2.41 cm. Pulmonic Valve: The pulmonic valve was grossly normal. Pulmonic valve regurgitation is trivial. No evidence of pulmonic stenosis. Aorta: The aortic root and ascending aorta are structurally normal, with no evidence of dilitation. Venous: The inferior vena cava is dilated in size with greater than 50% respiratory variability, suggesting right atrial pressure of 8 mmHg. IAS/Shunts: The atrial septum is grossly normal.  LEFT VENTRICLE PLAX 2D LVIDd:         5.60 cm       Diastology LVIDs:         5.00 cm      LV e' medial:    2.95 cm/s LV PW:         1.30 cm      LV E/e' medial:  29.6 LV IVS:        1.20 cm      LV e' lateral:   3.02 cm/s LVOT diam:     2.10 cm      LV E/e' lateral: 28.9 LV SV:         41 LV SV Index:   21 LVOT Area:     3.46 cm  LV Volumes (MOD) LV vol d, MOD A2C: 151.0 ml LV vol d, MOD A4C: 118.0 ml LV vol s, MOD A2C: 76.5 ml LV vol s, MOD A4C: 73.6 ml LV SV MOD A2C:     74.5 ml LV SV MOD A4C:     118.0 ml LV SV MOD BP:      59.4 ml RIGHT VENTRICLE RV Basal diam:  4.40 cm RV Mid diam:    4.90 cm RV S prime:     8.07 cm/s TAPSE (M-mode): 2.6 cm LEFT ATRIUM             Index        RIGHT ATRIUM           Index LA diam:        4.00 cm 2.00 cm/m   RA Area:     26.90 cm LA Vol (A2C):   87.8 ml 43.90 ml/m  RA Volume:   96.70 ml  48.35 ml/m LA Vol (A4C):   70.5 ml 35.25 ml/m LA Biplane Vol: 77.9 ml 38.95 ml/m  AORTIC VALVE  PULMONIC VALVE AV Area (Vmax):    2.56 cm     PV Vmax:       0.51 m/s AV Area (Vmean):   2.26 cm     PV Peak grad:  1.0 mmHg AV Area (VTI):     2.41 cm AV Vmax:           88.40 cm/s AV Vmean:          64.500 cm/s AV VTI:            0.171 m AV Peak Grad:      3.1 mmHg AV Mean Grad:      2.0 mmHg LVOT Vmax:         65.30 cm/s LVOT Vmean:        42.100 cm/s LVOT VTI:          0.119 m LVOT/AV VTI ratio: 0.70  AORTA Ao Root diam: 3.80 cm Ao Asc diam:  3.30 cm MITRAL VALVE               TRICUSPID VALVE MV Area (PHT): 4.57 cm    TR Peak grad:   46.5 mmHg MV Area VTI:   2.05 cm    TR Vmax:        341.00 cm/s MV Peak grad:  4.1 mmHg MV Mean grad:  1.0 mmHg    SHUNTS MV Vmax:       1.01 m/s    Systemic VTI:  0.12 m MV Vmean:      45.8 cm/s   Systemic Diam: 2.10 cm MV Decel Time: 166 msec MV E velocity: 87.40 cm/s MV A velocity: 39.40 cm/s MV E/A ratio:  2.22 Eleonore Chiquito MD Electronically signed by Eleonore Chiquito MD Signature Date/Time: 12/18/2020/2:41:59 PM    Final     Cardiac Studies   TTE 12/18/20 1. Left ventricular  ejection fraction, by estimation, is 20 to 25%. The  left ventricle has severely decreased function. The left ventricle  demonstrates global hypokinesis. There is mild concentric left ventricular  hypertrophy. Left ventricular diastolic   parameters are consistent with Grade III diastolic dysfunction  (restrictive). Elevated left ventricular end-diastolic pressure. The E/e'  is 29.6.   2. Right ventricular systolic function is moderately reduced. The right  ventricular size is severely enlarged. There is moderately elevated  pulmonary artery systolic pressure. The estimated right ventricular  systolic pressure is 46.9 mmHg.   3. Left atrial size was mildly dilated.   4. Right atrial size was mild to moderately dilated.   5. The mitral valve is grossly normal. Mild mitral valve regurgitation.  No evidence of mitral stenosis.   6. Tricuspid valve regurgitation is moderate to severe.   7. The aortic valve is tricuspid. Aortic valve regurgitation is not  visualized. No aortic stenosis is present.   8. The inferior vena cava is dilated in size with >50% respiratory  variability, suggesting right atrial pressure of 8 mmHg.   Comparison(s): Changes from prior study are noted. The left ventricular  function is significantly worse.   TTE 06/23/2020  1. Inferior basal hypokinesis . Left ventricular ejection fraction, by  estimation, is 50 to 55%. The left ventricle has low normal function. The  left ventricle demonstrates regional wall motion abnormalities (see  scoring diagram/findings for  description). The left ventricular internal cavity size was mildly  dilated. There is mild left ventricular hypertrophy. Left ventricular  diastolic parameters were normal.   2. Right ventricular systolic function is normal. The right ventricular  size is normal. There is normal pulmonary artery systolic pressure.   3. A small pericardial effusion is present. The pericardial effusion is  posterior to the  left ventricle and lateral to the left ventricle.   4. The mitral valve is abnormal. Trivial mitral valve regurgitation. No  evidence of mitral stenosis.   5. Calcification of the right and non coronary commisure . The aortic  valve is tricuspid. There is moderate calcification of the aortic valve.  Aortic valve regurgitation is trivial. Mild to moderate aortic valve  sclerosis/calcification is present,  without any evidence of aortic stenosis.   6. The inferior vena cava is normal in size with greater than 50%  respiratory variability, suggesting right atrial pressure of 3 mmHg.    LHC 2017 Mid LAD lesion, 20% stenosed.  Patient Profile     65 y.o. male with PMH chronic systolic heart failure with recovered EF, PAD s/p bilateral BKA, hypertension, CKD stage 3a who was admitted with acute on chronic combined systolic and diastolic heart failure, EF this admission now again reduced.  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 This admission, EF reduced to 20-25% with severely reduced RV function as well. -question initial weight (one noted as 90.7 kg, the other 94 kg). I/O to date net negative about 1 L -Cr up to 1.67 today, was 1.55 yesterday -currently on lasix 80 mg IV BID, imdur 30 mg daily, hydralazine 50 mg every 8 hours, bisoprolol 5 mg daily -His JVD is improved (though not normalized) and he has no significant peripheral edema, breathing well on room air.  -With his exam and Cr increase, will scale back lasix dosing to once daily oral dose. No home diuretics noted prior to admission. If renal function stabilizes, would start SGLT2i tomorrow -he has a significant change in biventricular function compared to prior. I personally reviewed both echoes today.  -concern would be how to  optimize adherence at discharge--has historically had difficulty taking medications routinely at home. TID medication not ideal. Has had assistance from paramedicine in the past -if renal function stabilizes, may be able to try ARB/ARNI if started in the hospital and titrated. -has not been seen by cardiology since 2018. Noted to not be a candidate for advanced therapies at prior admission.  Chest pain History of nonobstructive CAD PAD Hypercholesterolemia Elevated troponin -continue aspirin 81 mg daily, atorvastatin 40 mg daily -LDL 49, at goal -troponins elevated but flat, no further chest pain. Suspect this is secondary to his acute heart failure -with his renal function and prior cath results, no plans for coronary angiography this admission  Type II diabetes -on glargine insulin, sitagliptin, metformin at home -A1c 6.1 -with Cr >1.5, would discontinue metformin -would recommend SGLT2i given ASCVD and heart failure. Will start this admission if able  Total time of encounter: 45 minutes total time of encounter, including face-to-face patient care. This time includes coordination of care and counseling regarding review of echoes and review of prior cardiovascular history. Remainder of non-face-to-face time involved reviewing chart documents/testing relevant to the patient encounter and documentation in the medical record.  Buford Dresser, MD, PhD, [place]     For questions or updates, please contact [place] Please consult [email or web address] for contact info under        Signed, Buford Dresser,  MD  12/19/2020, 10:31 AM

## 2020-12-19 NOTE — Progress Notes (Signed)
Moaning every now and then, when asked if he  is alright, claimed that he is ok.

## 2020-12-20 DIAGNOSIS — I10 Essential (primary) hypertension: Secondary | ICD-10-CM | POA: Diagnosis not present

## 2020-12-20 DIAGNOSIS — I5043 Acute on chronic combined systolic (congestive) and diastolic (congestive) heart failure: Secondary | ICD-10-CM | POA: Diagnosis not present

## 2020-12-20 DIAGNOSIS — R778 Other specified abnormalities of plasma proteins: Secondary | ICD-10-CM | POA: Diagnosis not present

## 2020-12-20 DIAGNOSIS — Z794 Long term (current) use of insulin: Secondary | ICD-10-CM

## 2020-12-20 DIAGNOSIS — E1122 Type 2 diabetes mellitus with diabetic chronic kidney disease: Secondary | ICD-10-CM

## 2020-12-20 DIAGNOSIS — N1831 Chronic kidney disease, stage 3a: Secondary | ICD-10-CM | POA: Diagnosis not present

## 2020-12-20 LAB — GLUCOSE, CAPILLARY
Glucose-Capillary: 80 mg/dL (ref 70–99)
Glucose-Capillary: 134 mg/dL — ABNORMAL HIGH (ref 70–99)
Glucose-Capillary: 92 mg/dL (ref 70–99)

## 2020-12-20 LAB — BASIC METABOLIC PANEL
Anion gap: 7 (ref 5–15)
BUN: 27 mg/dL — ABNORMAL HIGH (ref 8–23)
CO2: 25 mmol/L (ref 22–32)
Calcium: 8.6 mg/dL — ABNORMAL LOW (ref 8.9–10.3)
Chloride: 106 mmol/L (ref 98–111)
Creatinine, Ser: 1.52 mg/dL — ABNORMAL HIGH (ref 0.61–1.24)
GFR, Estimated: 51 mL/min — ABNORMAL LOW (ref >60)
Glucose, Bld: 101 mg/dL — ABNORMAL HIGH (ref 70–99)
Potassium: 4.3 mmol/L (ref 3.5–5.1)
Sodium: 138 mmol/L (ref 135–145)

## 2020-12-20 MED ORDER — HYDRALAZINE HCL 50 MG PO TABS: 50.0000 mg | ORAL_TABLET | Freq: Three times a day (TID) | ORAL | Status: DC

## 2020-12-20 MED ORDER — ISOSORBIDE MONONITRATE ER 30 MG PO TB24: 30.0000 mg | ORAL_TABLET | Freq: Every day | ORAL | Status: AC

## 2020-12-20 MED ORDER — EMPAGLIFLOZIN 10 MG PO TABS
10.0000 mg | ORAL_TABLET | Freq: Every day | ORAL | Status: DC
  Administered 2020-12-20: 10 mg via ORAL
  Filled 2020-12-20: qty 1

## 2020-12-20 MED ORDER — FUROSEMIDE 40 MG PO TABS: 40.0000 mg | ORAL_TABLET | Freq: Every day | ORAL | Status: DC

## 2020-12-20 NOTE — Discharge Summary (Addendum)
[facility] Discharge Summary  Patient name: [name] Medical record number: [id number] Date of birth: 1956-02-06 Age: 65 y.o. Gender: male Date of Admission: 12/17/2020  Date of Discharge: 12/20/2020 Admitting Physician: Lind Covert, MD  Primary Care Provider: [name], MD Consultants: cardiology  Indication for Hospitalization: chest pain  Discharge Diagnoses/Problem List:  Chest pain with NSTEMI HF exacerbation ABD pain T2DM, with severe complications HTN ESP2Z Hx of Seizures HLD  Disposition: [name] (SNF)  Discharge Condition: stable/improved  Discharge Exam:  Physical Exam Vitals reviewed.  Cardiovascular:     Rate and Rhythm: Normal rate and regular rhythm.     Comments: Mild JVD Pulmonary:     Effort: Pulmonary effort is normal.     Breath sounds: Normal breath sounds.  Abdominal:     General: Bowel sounds are normal.     Palpations: Abdomen is soft.     Tenderness: There is no abdominal tenderness.  Skin:    General: Skin is warm and dry.  Neurological:     Mental Status: He is alert.   Brief Hospital Course:  [name] is a 65 y.o. male presenting with chest pain. PMH is significant for HTN, HLD, HFimpEF, NICM, DM2, s/p bilateral BKA, CKD stage III, prior CVA, seizures.  Chest pain, NSTEMI, Likely 2/2 HF exacerbation Patient presented with sharp, substernal chest pain x3-4 days that occurred after eating, not worsened by exertion. Patient also reported worsening orthopnea and paroxysmal nocturnal dyspnea and a weight gain of 10-15 lbs in his abdomen. Workup remarkable for elevated troponin to 273->275. EKG without acute ST changes. CXR with diffuse opacities, likely pulmonary edema. BNP of 2.2K. His ACS was thought to be demand ischemia in the context of HF exacerbation. He was started on a heparin drip and diuresed with lasix 40 mg IV initially, then 80 mg IV BID, then 80 mg IV x1. He was then  transitioned to an oral regimen of 40 mg PO Lasix daily. Of note an echo was obtained showing worsening EF to 20-25% (prev 50-55%), G3DD (prev normal), mod to severe TR. Afterload reduction with hydral added (Imdur as well). On discharge he was felt to be fairly euvolemic with clear lung sounds and minimal JVD and lower extremity/dependent edema.   Abdominal pain On admission, patient reported mild RLQ pain x3 weeks he believes to be related to constipation. Pain improved, with no tenderness. He reported a BM on admission and no more ABD pain. Continued home MiraLAX twice daily and Senokot daily.   Type 2 Diabetes with severe complications R0Q on admission 6.1, down from 8.0 in April.  Since admission, fingerstick glucose ranged in the mid to low 100's Home meds include Lantus 20 units daily, Januvia 800 mg daily, metformin 1000 mg twice daily. He was maintained on sensitive sliding scale insulin alone with adequate glycemic control. He was discharged with instruction to discontinue his glargine but continue his other home medications.    HTN Continued home anti-HTN regimen: Bisoprolol 5 mg daily, and added Hydralazine 50 mg 3 times daily, Imdur 30 mg daily.    CKD Stage IIIB Baseline Cr thought to be 1.2-1.3. Cr up to a peak of 1.67. Down to 1.52 on discharge, with the expectation that this will continue to fall down to baseline.    Hx of seizures No recent seizure activity. Continued home meds Keppra 750 mg BID and Depakote 125 mg BID. -Continue home Keppra and Depakote   HLD Lipid panel on  admission shows well-controlled hyperlipidemia.  LDL 49, total cholesterol 106.  Continued atorvastatin 40 mg daily.   Issues for Follow Up:  Obtain BMP within 1 week to assess potassium on new diuretic regimen as well as Cr given post-AKI state.  Consider addition of Jardiance 10 mg daily and ARB/ARNi for GDMT of HFrEF Consider obtaining Depakote level given low dose patient is taking  Significant  Procedures: echo as above  Significant Labs and Imaging:  Recent Labs  Lab 12/17/20 1400 12/18/20 0430  WBC 8.6 8.4  HGB 12.0* 12.3*  HCT 40.1 39.9  PLT 352 340   Recent Labs  Lab 12/17/20 1400 12/18/20 0430 12/19/20 0825 12/20/20 0357  NA 143 145 138 138  K 3.6 4.0 4.1 4.3  CL 110 110 106 106  CO2 27 28 26 25   GLUCOSE 122* 141* 150* 101*  BUN 16 18 25* 27*  CREATININE 1.47* 1.55* 1.67* 1.52*  CALCIUM 8.6* 8.6* 8.6* 8.6*  MG  --  2.0 1.9  --     Results/Tests Pending at Time of Discharge: none  Discharge Medications:  Allergies as of 12/20/2020   No Known Allergies      Medication List     STOP taking these medications    chlorhexidine 4 % external liquid Commonly known as: HIBICLENS   feeding supplement (GLUCERNA SHAKE) Liqd   insulin glargine 100 UNIT/ML injection Commonly known as: LANTUS   mirtazapine 7.5 MG tablet Commonly known as: REMERON   Pro-Stat Liqd       TAKE these medications    acetaminophen 325 MG tablet Commonly known as: TYLENOL Take 650 mg by mouth every 6 (six) hours as needed for mild pain (AND CANNOT EXCEED 3,000 MG/24 HOURS OF TYLENOL FROM ALL COMBINED SOURCES).   Altalube 85-15 % Oint Place 1 application into both eyes at bedtime.   aspirin 81 MG EC tablet Take 81 mg by mouth daily.   atorvastatin 40 MG tablet Commonly known as: LIPITOR Take 40 mg by mouth at bedtime.   bisoprolol 5 MG tablet Commonly known as: ZEBETA Take 1 tablet (5 mg total) by mouth daily. What changed:  when to take this reasons to take this   brimonidine 0.2 % ophthalmic solution Commonly known as: ALPHAGAN PLACE 1 DROP INTO THE LEFT EYE 2 (TWO) TIMES DAILY. What changed:  how much to take when to take this additional instructions   divalproex 125 MG DR tablet Commonly known as: DEPAKOTE Take 125 mg by mouth in the morning and at bedtime.   dorzolamide-timolol 22.3-6.8 MG/ML ophthalmic solution Commonly known as: COSOPT Place  1 drop into the left eye 2 (two) times daily.   furosemide 40 MG tablet Commonly known as: LASIX Take 1 tablet (40 mg total) by mouth daily. Start taking on: December 21, 2020   GlucaGen HypoKit 1 MG Solr injection Generic drug: glucagon Inject 1 mg into the muscle once as needed for low blood sugar.   hydrALAZINE 50 MG tablet Commonly known as: APRESOLINE Take 1 tablet (50 mg total) by mouth every 8 (eight) hours.   isosorbide mononitrate 30 MG 24 hr tablet Commonly known as: IMDUR Take 1 tablet (30 mg total) by mouth daily. Start taking on: December 21, 2020   levETIRAcetam 750 MG tablet Commonly known as: KEPPRA Take 1 tablet (750 mg total) by mouth 2 (two) times daily.   metFORMIN 1000 MG tablet Commonly known as: GLUCOPHAGE Take 1,000 mg by mouth 2 (two) times daily with a meal.  pantoprazole 20 MG tablet Commonly known as: PROTONIX Take 20 mg by mouth daily before breakfast.   polyethylene glycol 17 g packet Commonly known as: MIRALAX / GLYCOLAX Take 17 g by mouth 2 (two) times daily. What changed: when to take this   Rhopressa 0.02 % Soln Generic drug: Netarsudil Dimesylate Place 1 drop into the left eye at bedtime.   senna-docusate 8.6-50 MG tablet Commonly known as: Senokot-S Take 1 tablet by mouth at bedtime.   sertraline 50 MG tablet Commonly known as: ZOLOFT Take 50 mg by mouth in the morning.   sitaGLIPtin 100 MG tablet Commonly known as: JANUVIA Take 100 mg by mouth daily.   Systane 0.4-0.3 % Soln Generic drug: Polyethyl Glycol-Propyl Glycol Place 1 drop into both eyes every 8 (eight) hours as needed (for dryness).   tamsulosin 0.4 MG Caps capsule Commonly known as: FLOMAX Take 2 capsules (0.8 mg total) by mouth daily after supper.        Discharge Instructions: Please refer to Patient Instructions section of EMR for full details.  Patient was counseled important signs and symptoms that should prompt return to medical care, changes in  medications, dietary instructions, activity restrictions, and follow up appointments.   Follow-Up Appointments:  Follow-up Information     [facility] Follow up.   Specialty: Cardiology Why: Hospital follow-up scheduled for 12/27/2020 at 9:00am in our [facility]. This is located in the [facility]. You can use Entrance C. If this date/time does not work for you, please call our office to reschedule. Contact information: [address] [id number] QI [id number] [place]                 Corky Sox, MD PGY-1 [place] Intern pager: [phone], text pages welcome  FPTS Upper-Level Resident Addendum   I have independently interviewed and examined the patient. I have discussed the above with the original author and agree with their documentation.  Please see also any attending notes.    Carollee Leitz, MD PGY-3, [place] [facility] 12/20/2020 5:19 PM  FPTS Service pager: [phone] (text pages welcome through [facility])

## 2020-12-20 NOTE — Progress Notes (Signed)
FPTS Brief Progress Note  S: Resting with TV on    O: BP 121/82 (BP Location: Left Arm)   Pulse (!) 52   Temp 98.1 F (36.7 C) (Oral)   Resp 15   Ht 5\' 6"  (1.676 m)   Wt 94 kg   SpO2 95%   BMI 33.45 kg/m    RESP: equal chest rise and fall   A/P: Intermittently bradycardic.   - Orders reviewed. Labs for AM ordered, which was adjusted as needed.    Lyndee Hensen, DO 12/20/2020, 2:40 AM PGY-3, [facility] Night Resident  Please page [phone] with questions.

## 2020-12-20 NOTE — Discharge Instructions (Signed)
Dear [name],   Thank you for letting us participate in your care! In this section, you will find a brief hospital admission summary of why you were admitted to the hospital, what happened during your admission, your diagnosis/diagnoses, and recommended follow up.  You were admitted because you were experiencing chest pain.  Your testing revealed poor oxygen supply to your heart, elevated troponin. You were diagnosed with NSTEMI, a small heart attack.  You were treated with heparin and Lasix (water pill). You were also seen by caridology. They recommended adding Jardiance and Sacubitril to your regimen later.  Your chest pain improved and you were discharged from the hospital for meeting this goal.    POST-HOSPITAL & CARE INSTRUCTIONS Please let PCP/Specialists know of any changes in medications that were made.  Please see medications section of this packet for any medication changes.   DOCTOR'S APPOINTMENTS & FOLLOW UP Future Appointments  Date Time Provider [place]  12/27/2020  9:00 AM [facility] None     Thank you for choosing [facility]! Take care and be well!  [facility]  [place], [place] [address] [phone]

## 2020-12-20 NOTE — TOC Transition Note (Signed)
Transition of Care [facility]) - CM/SW Discharge Note   Patient Details  Name: [name] MRN: [id number] Date of Birth: [birth year]  [facility] CM/SW Contact:  Tresa Endo Phone Number: 12/20/2020, 2:32 PM   Clinical Narrative:    Patient will DC to: [name] Anticipated DC date:12/20/2020 Family notified: Pt sister Transport by: Corey Harold   Per MD patient ready for DC to [name] room 153-A. RN to call report prior to discharge [phone]). RN, patient, patient's family, and facility notified of DC. Discharge Summary and FL2 sent to facility. DC packet on chart. Ambulance transport requested for patient.   CSW will sign off for now as social work intervention is no longer needed. Please consult [place] again if new needs arise.           Patient Goals and CMS Choice        Discharge Placement                       Discharge Plan and Services                                     Social Determinants of Health (SDOH) Interventions     Readmission Risk Interventions No flowsheet data found.

## 2020-12-20 NOTE — Progress Notes (Signed)
Progress Note  Patient Name: [name] Date of Encounter: 12/20/2020  [facility] Cardiologist: Werner Lean, MD   Subjective   No acute events overnight. Breathing comfortably on room air. No chest pain. Reviewed medications with him. Discussed follow up with heart failure clinic.  Inpatient Medications    Scheduled Meds:  artificial tears  1 application Both Eyes QHS   aspirin EC  81 mg Oral Daily   atorvastatin  40 mg Oral QHS   bisoprolol  5 mg Oral Daily   brimonidine  1 drop Left Eye BID   And   brimonidine  1 drop Right Eye TID   divalproex  125 mg Oral BID   dorzolamide-timolol  1 drop Left Eye BID   empagliflozin  10 mg Oral Daily   enoxaparin (LOVENOX) injection  40 mg Subcutaneous Q24H   furosemide  40 mg Oral Daily   hydrALAZINE  50 mg Oral Q8H   insulin aspart  0-9 Units Subcutaneous TID WC   isosorbide mononitrate  30 mg Oral Daily   levETIRAcetam  750 mg Oral BID   melatonin  5 mg Oral QHS   pantoprazole  20 mg Oral QAC breakfast   polyethylene glycol  17 g Oral BID   senna-docusate  1 tablet Oral QHS   sertraline  50 mg Oral q AM   tamsulosin  0.8 mg Oral QPC supper   Continuous Infusions:  PRN Meds: acetaminophen, polyvinyl alcohol   Vital Signs    Vitals:   12/19/20 2336 12/20/20 0309 12/20/20 0752 12/20/20 1125  BP: 121/82 (!) 141/99 (!) 156/110 129/90  Pulse: (!) 52 69    Resp: 15 20 14  (!) 22  Temp: 98.1 F (36.7 C) (!) 97.5 F (36.4 C) 97.8 F (36.6 C) 97.8 F (36.6 C)  TempSrc: Oral Oral Oral Oral  SpO2: 95% 97% 97% 97%  Weight:  93.7 kg    Height:        Intake/Output Summary (Last 24 hours) at 12/20/2020 1246 Last data filed at 12/20/2020 1234 Gross per 24 hour  Intake 1080 ml  Output 500 ml  Net 580 ml   Last 3 Weights 12/20/2020 12/19/2020 12/17/2020  Weight (lbs) 206 lb 9.1 oz 207 lb 3.7 oz 200 lb  Weight (kg) 93.7 kg 94 kg 90.719 kg      Telemetry    NSR - Personally Reviewed  ECG    No  new since 10/15 - Personally Reviewed  Physical Exam   GEN: No acute distress.   Neck: JVD at clavicle at 90 degrees Cardiac: RRR, no murmurs, rubs, or gallops.  Respiratory: Clear to auscultation bilaterally. GI: Soft, nontender, non-distended  MS: No edema; s/p bilateral BKA Neuro:  Nonfocal  Psych: Normal affect   Labs    High Sensitivity Troponin:   Recent Labs  Lab 12/17/20 1400 12/17/20 1609  TROPONINIHS 273* 275*     Chemistry Recent Labs  Lab 12/18/20 0430 12/19/20 0825 12/20/20 0357  NA 145 138 138  K 4.0 4.1 4.3  CL 110 106 106  CO2 28 26 25   GLUCOSE 141* 150* 101*  BUN 18 25* 27*  CREATININE 1.55* 1.67* 1.52*  CALCIUM 8.6* 8.6* 8.6*  MG 2.0 1.9  --   GFRNONAA 50* 45* 51*  ANIONGAP 7 6 7     Lipids  Recent Labs  Lab 12/17/20 2015  CHOL 106  TRIG 45  HDL 48  LDLCALC 49  CHOLHDL 2.2    Hematology Recent  Labs  Lab 12/17/20 1400 12/18/20 0430  WBC 8.6 8.4  RBC 4.61 4.64  HGB 12.0* 12.3*  HCT 40.1 39.9  MCV 87.0 86.0  MCH 26.0 26.5  MCHC 29.9* 30.8  RDW 16.1* 16.0*  PLT 352 340   Thyroid  Recent Labs  Lab 12/19/20 0825  TSH 1.129    BNP Recent Labs  Lab 12/17/20 2015  BNP 2,244.0*    DDimer No results for input(s): DDIMER in the last 168 hours.   Radiology    ECHOCARDIOGRAM COMPLETE  Result Date: 12/18/2020    ECHOCARDIOGRAM REPORT   Patient Name:   [name] Date of Exam: 12/18/2020 Medical Rec #:  [id number]       Height:       66.0 in Accession #:    [id number]      Weight:       200.0 lb Date of Birth:  29-Aug-1955       BSA:          2.000 m Patient Age:    65 years        BP:           138/100 mmHg Patient Gender: M               HR:           69 bpm. Exam Location:  Inpatient Procedure: 2D Echo, Cardiac Doppler and Color Doppler Indications:    CHF-ACUTE SYSTOLIC  History:        Patient has prior history of Echocardiogram examinations, most                 recent 06/23/2020. CHF, PAD; Risk Factors:Diabetes and                  Hypertension. Dilated Cardiomyopathy.  Sonographer:    Wenda Low Referring Phys: [place]  1. Left ventricular ejection fraction, by estimation, is 20 to 25%. The left ventricle has severely decreased function. The left ventricle demonstrates global hypokinesis. There is mild concentric left ventricular hypertrophy. Left ventricular diastolic  parameters are consistent with Grade III diastolic dysfunction (restrictive). Elevated left ventricular end-diastolic pressure. The E/e' is 29.6.  2. Right ventricular systolic function is moderately reduced. The right ventricular size is severely enlarged. There is moderately elevated pulmonary artery systolic pressure. The estimated right ventricular systolic pressure is 40.9 mmHg.  3. Left atrial size was mildly dilated.  4. Right atrial size was mild to moderately dilated.  5. The mitral valve is grossly normal. Mild mitral valve regurgitation. No evidence of mitral stenosis.  6. Tricuspid valve regurgitation is moderate to severe.  7. The aortic valve is tricuspid. Aortic valve regurgitation is not visualized. No aortic stenosis is present.  8. The inferior vena cava is dilated in size with >50% respiratory variability, suggesting right atrial pressure of 8 mmHg. Comparison(s): Changes from prior study are noted. The left ventricular function is significantly worse. FINDINGS  Left Ventricle: Left ventricular ejection fraction, by estimation, is 20 to 25%. The left ventricle has severely decreased function. The left ventricle demonstrates global hypokinesis. The left ventricular internal cavity size was normal in size. There is mild concentric left ventricular hypertrophy. Left ventricular diastolic parameters are consistent with Grade III diastolic dysfunction (restrictive). Elevated left ventricular end-diastolic pressure. The E/e' is 29.6. Right Ventricle: The right ventricular size is severely enlarged. No increase in right  ventricular wall thickness. Right ventricular systolic function is moderately reduced. There is  moderately elevated pulmonary artery systolic pressure. The tricuspid regurgitant velocity is 3.41 m/s, and with an assumed right atrial pressure of 8 mmHg, the estimated right ventricular systolic pressure is 35.7 mmHg. Left Atrium: Left atrial size was mildly dilated. Right Atrium: Right atrial size was mild to moderately dilated. Pericardium: Trivial pericardial effusion is present. Mitral Valve: The mitral valve is grossly normal. Mild mitral valve regurgitation. No evidence of mitral valve stenosis. MV peak gradient, 4.1 mmHg. The mean mitral valve gradient is 1.0 mmHg. Tricuspid Valve: The tricuspid valve is grossly normal. Tricuspid valve regurgitation is moderate to severe. No evidence of tricuspid stenosis. Aortic Valve: The aortic valve is tricuspid. Aortic valve regurgitation is not visualized. No aortic stenosis is present. Aortic valve mean gradient measures 2.0 mmHg. Aortic valve peak gradient measures 3.1 mmHg. Aortic valve area, by VTI measures 2.41 cm. Pulmonic Valve: The pulmonic valve was grossly normal. Pulmonic valve regurgitation is trivial. No evidence of pulmonic stenosis. Aorta: The aortic root and ascending aorta are structurally normal, with no evidence of dilitation. Venous: The inferior vena cava is dilated in size with greater than 50% respiratory variability, suggesting right atrial pressure of 8 mmHg. IAS/Shunts: The atrial septum is grossly normal.  LEFT VENTRICLE PLAX 2D LVIDd:         5.60 cm      Diastology LVIDs:         5.00 cm      LV e' medial:    2.95 cm/s LV PW:         1.30 cm      LV E/e' medial:  29.6 LV IVS:        1.20 cm      LV e' lateral:   3.02 cm/s LVOT diam:     2.10 cm      LV E/e' lateral: 28.9 LV SV:         41 LV SV Index:   21 LVOT Area:     3.46 cm  LV Volumes (MOD) LV vol d, MOD A2C: 151.0 ml LV vol d, MOD A4C: 118.0 ml LV vol s, MOD A2C: 76.5 ml LV vol s, MOD  A4C: 73.6 ml LV SV MOD A2C:     74.5 ml LV SV MOD A4C:     118.0 ml LV SV MOD BP:      59.4 ml RIGHT VENTRICLE RV Basal diam:  4.40 cm RV Mid diam:    4.90 cm RV S prime:     8.07 cm/s TAPSE (M-mode): 2.6 cm LEFT ATRIUM             Index        RIGHT ATRIUM           Index LA diam:        4.00 cm 2.00 cm/m   RA Area:     26.90 cm LA Vol (A2C):   87.8 ml 43.90 ml/m  RA Volume:   96.70 ml  48.35 ml/m LA Vol (A4C):   70.5 ml 35.25 ml/m LA Biplane Vol: 77.9 ml 38.95 ml/m  AORTIC VALVE                    PULMONIC VALVE AV Area (Vmax):    2.56 cm     PV Vmax:       0.51 m/s AV Area (Vmean):   2.26 cm     PV Peak grad:  1.0 mmHg AV Area (VTI):     2.41 cm  AV Vmax:           88.40 cm/s AV Vmean:          64.500 cm/s AV VTI:            0.171 m AV Peak Grad:      3.1 mmHg AV Mean Grad:      2.0 mmHg LVOT Vmax:         65.30 cm/s LVOT Vmean:        42.100 cm/s LVOT VTI:          0.119 m LVOT/AV VTI ratio: 0.70  AORTA Ao Root diam: 3.80 cm Ao Asc diam:  3.30 cm MITRAL VALVE               TRICUSPID VALVE MV Area (PHT): 4.57 cm    TR Peak grad:   46.5 mmHg MV Area VTI:   2.05 cm    TR Vmax:        341.00 cm/s MV Peak grad:  4.1 mmHg MV Mean grad:  1.0 mmHg    SHUNTS MV Vmax:       1.01 m/s    Systemic VTI:  0.12 m MV Vmean:      45.8 cm/s   Systemic Diam: 2.10 cm MV Decel Time: 166 msec MV E velocity: 87.40 cm/s MV A velocity: 39.40 cm/s MV E/A ratio:  2.22 Eleonore Chiquito MD Electronically signed by Eleonore Chiquito MD Signature Date/Time: 12/18/2020/2:41:59 PM    Final     Cardiac Studies   TTE 12/18/20 1. Left ventricular ejection fraction, by estimation, is 20 to 25%. The  left ventricle has severely decreased function. The left ventricle  demonstrates global hypokinesis. There is mild concentric left ventricular  hypertrophy. Left ventricular diastolic   parameters are consistent with Grade III diastolic dysfunction  (restrictive). Elevated left ventricular end-diastolic pressure. The E/e'  is 29.6.   2.  Right ventricular systolic function is moderately reduced. The right  ventricular size is severely enlarged. There is moderately elevated  pulmonary artery systolic pressure. The estimated right ventricular  systolic pressure is 06.2 mmHg.   3. Left atrial size was mildly dilated.   4. Right atrial size was mild to moderately dilated.   5. The mitral valve is grossly normal. Mild mitral valve regurgitation.  No evidence of mitral stenosis.   6. Tricuspid valve regurgitation is moderate to severe.   7. The aortic valve is tricuspid. Aortic valve regurgitation is not  visualized. No aortic stenosis is present.   8. The inferior vena cava is dilated in size with >50% respiratory  variability, suggesting right atrial pressure of 8 mmHg.   Comparison(s): Changes from prior study are noted. The left ventricular  function is significantly worse.   TTE 06/23/2020  1. Inferior basal hypokinesis . Left ventricular ejection fraction, by  estimation, is 50 to 55%. The left ventricle has low normal function. The  left ventricle demonstrates regional wall motion abnormalities (see  scoring diagram/findings for  description). The left ventricular internal cavity size was mildly  dilated. There is mild left ventricular hypertrophy. Left ventricular  diastolic parameters were normal.   2. Right ventricular systolic function is normal. The right ventricular  size is normal. There is normal pulmonary artery systolic pressure.   3. A small pericardial effusion is present. The pericardial effusion is  posterior to the left ventricle and lateral to the left ventricle.   4. The mitral valve is abnormal. Trivial mitral valve regurgitation. No  evidence of mitral stenosis.   5. Calcification of the right and non coronary commisure . The aortic  valve is tricuspid. There is moderate calcification of the aortic valve.  Aortic valve regurgitation is trivial. Mild to moderate aortic valve   sclerosis/calcification is present,  without any evidence of aortic stenosis.   6. The inferior vena cava is normal in size with greater than 50%  respiratory variability, suggesting right atrial pressure of 3 mmHg.    LHC 2017 Mid LAD lesion, 20% stenosed.  Patient Profile     65 y.o. male with PMH chronic systolic heart failure with recovered EF, PAD s/p bilateral BKA, hypertension, CKD stage 3a who was admitted with acute on chronic combined systolic and diastolic heart failure, EF this admission now again reduced.  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 This admission, EF reduced to 20-25% with severely reduced RV function as well. -question initial weight (one noted as 90.7 kg, the other 94 kg). I/O to date net negative about 0.5 L -Cr improving today -currently on imdur 30 mg daily, hydralazine 50 mg every 8 hours, bisoprolol 5 mg daily -His JVD is improved and he has no significant peripheral edema, breathing well on room air. Stable for discharge. -On oral diuretic today, starting SGLT2i today -concern would be how to optimize adherence at discharge--has historically had difficulty taking medications routinely at home. TID medication not ideal. Has had assistance from paramedicine in the past -if renal function stabilizes, may be able to try ARB/ARNI as an outpatient if his renal function can be monitored closely -has not been seen by cardiology since 2018. Noted to not be a candidate for advanced therapies at prior admission.  Chest pain History of nonobstructive CAD PAD Hypercholesterolemia Elevated troponin -continue aspirin 81 mg daily, atorvastatin 40 mg daily -LDL 49, at goal -troponins elevated but flat, no further chest pain. Suspect this is secondary to his acute  heart failure -with his renal function and prior cath results, no plans for coronary angiography this admission  Type II diabetes -on glargine insulin, sitagliptin, metformin at home -A1c 6.1 -with Cr >1.5, would discontinue metformin -would recommend SGLT2i given ASCVD and heart failure. Will start Jardiance 10 mg daily.  [facility] will sign off.   Medication Recommendations:   CONTINUE Aspirin 81 mg daily Atorvastatin 40 mg daily Bisoprolol 5 mg daily Furosemide 40 mg daily Hydralazine 50 mg TID Imdur 30 mg daily  START Empagliflozin 10 mg daily  STOP Metformin  Other recommendations (labs, testing, etc):  will need BMET at follow up Follow up as an outpatient:  We will arrange for outpatient cardiology follow up  Buford Dresser, MD, PhD, [place]     For questions or updates, please contact [place] Please consult [email or web address] for contact info under        Signed, Buford Dresser, MD  12/20/2020, 12:46 PM

## 2020-12-20 NOTE — Plan of Care (Signed)
  Problem: Education: Goal: Knowledge of General Education information will improve Description: Including pain rating scale, medication(s)/side effects and non-pharmacologic comfort measures Outcome: Progressing   Problem: Health Behavior/Discharge Planning: Goal: Ability to manage health-related needs will improve Outcome: Progressing   Problem: Clinical Measurements: Goal: Will remain free from infection Outcome: Progressing   Problem: Activity: Goal: Risk for activity intolerance will decrease Outcome: Progressing   Problem: Pain Managment: Goal: General experience of comfort will improve Outcome: Progressing   Problem: Skin Integrity: Goal: Risk for impaired skin integrity will decrease Outcome: Progressing

## 2020-12-27 ENCOUNTER — Other Ambulatory Visit: Payer: Self-pay

## 2020-12-27 ENCOUNTER — Ambulatory Visit (HOSPITAL_COMMUNITY)
Admit: 2020-12-27 | Discharge: 2020-12-27 | Disposition: A | Discharge status: Home / Self Care | Payer: Medicaid Other | Source: Ambulatory Visit | Attending: Cardiology | Admitting: Cardiology

## 2020-12-27 ENCOUNTER — Encounter (HOSPITAL_COMMUNITY): Payer: Self-pay

## 2020-12-27 VITALS — BP 136/78 | HR 65 | Wt 195.0 lb

## 2020-12-27 DIAGNOSIS — I2510 Atherosclerotic heart disease of native coronary artery without angina pectoris: Secondary | ICD-10-CM | POA: Insufficient documentation

## 2020-12-27 DIAGNOSIS — I130 Hypertensive heart and chronic kidney disease with heart failure and stage 1 through stage 4 chronic kidney disease, or unspecified chronic kidney disease: Secondary | ICD-10-CM | POA: Diagnosis not present

## 2020-12-27 DIAGNOSIS — Z993 Dependence on wheelchair: Secondary | ICD-10-CM | POA: Diagnosis not present

## 2020-12-27 DIAGNOSIS — I272 Pulmonary hypertension, unspecified: Secondary | ICD-10-CM | POA: Insufficient documentation

## 2020-12-27 DIAGNOSIS — Z89511 Acquired absence of right leg below knee: Secondary | ICD-10-CM | POA: Diagnosis not present

## 2020-12-27 DIAGNOSIS — G40909 Epilepsy, unspecified, not intractable, without status epilepticus: Secondary | ICD-10-CM | POA: Diagnosis not present

## 2020-12-27 DIAGNOSIS — Z89512 Acquired absence of left leg below knee: Secondary | ICD-10-CM | POA: Insufficient documentation

## 2020-12-27 DIAGNOSIS — H548 Legal blindness, as defined in USA: Secondary | ICD-10-CM | POA: Diagnosis not present

## 2020-12-27 DIAGNOSIS — Z8249 Family history of ischemic heart disease and other diseases of the circulatory system: Secondary | ICD-10-CM | POA: Diagnosis not present

## 2020-12-27 DIAGNOSIS — Z9114 Patient's other noncompliance with medication regimen: Secondary | ICD-10-CM | POA: Insufficient documentation

## 2020-12-27 DIAGNOSIS — Z7984 Long term (current) use of oral hypoglycemic drugs: Secondary | ICD-10-CM | POA: Insufficient documentation

## 2020-12-27 DIAGNOSIS — R0600 Dyspnea, unspecified: Secondary | ICD-10-CM | POA: Insufficient documentation

## 2020-12-27 DIAGNOSIS — Z79899 Other long term (current) drug therapy: Secondary | ICD-10-CM | POA: Diagnosis not present

## 2020-12-27 DIAGNOSIS — I5042 Chronic combined systolic (congestive) and diastolic (congestive) heart failure: Secondary | ICD-10-CM | POA: Diagnosis not present

## 2020-12-27 DIAGNOSIS — Z7982 Long term (current) use of aspirin: Secondary | ICD-10-CM | POA: Insufficient documentation

## 2020-12-27 DIAGNOSIS — I420 Dilated cardiomyopathy: Secondary | ICD-10-CM | POA: Insufficient documentation

## 2020-12-27 DIAGNOSIS — N1831 Chronic kidney disease, stage 3a: Secondary | ICD-10-CM | POA: Diagnosis not present

## 2020-12-27 DIAGNOSIS — Z87891 Personal history of nicotine dependence: Secondary | ICD-10-CM | POA: Insufficient documentation

## 2020-12-27 DIAGNOSIS — E1122 Type 2 diabetes mellitus with diabetic chronic kidney disease: Secondary | ICD-10-CM | POA: Insufficient documentation

## 2020-12-27 LAB — BASIC METABOLIC PANEL
Anion gap: 8 (ref 5–15)
BUN: 30 mg/dL — ABNORMAL HIGH (ref 8–23)
CO2: 26 mmol/L (ref 22–32)
Calcium: 8.5 mg/dL — ABNORMAL LOW (ref 8.9–10.3)
Chloride: 105 mmol/L (ref 98–111)
Creatinine, Ser: 1.56 mg/dL — ABNORMAL HIGH (ref 0.61–1.24)
GFR, Estimated: 49 mL/min — ABNORMAL LOW (ref >60)
Glucose, Bld: 154 mg/dL — ABNORMAL HIGH (ref 70–99)
Potassium: 4.2 mmol/L (ref 3.5–5.1)
Sodium: 139 mmol/L (ref 135–145)

## 2020-12-27 MED ORDER — ENTRESTO 24-26 MG PO TABS: 1.0000 | ORAL_TABLET | Freq: Two times a day (BID) | ORAL | 3 refills | Status: AC

## 2020-12-27 NOTE — Patient Instructions (Signed)
Following orders sent back to [place] [phone]):  Start Entresto 24/26 mg Twice daily  Labs done today in our office (BMP) Your physician recommends that you schedule a follow-up appointment in: 1 week

## 2020-12-27 NOTE — Progress Notes (Signed)
HEART & VASCULAR TRANSITION OF CARE CONSULT NOTE     Referring Physician: Dr. Harrell Gave, Cardiology  Primary Care: [name], MD  Primary Cardiologist: Werner Lean, MD   HPI: Referred to clinic by Dr. Harrell Gave for heart failure consultation.   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 Legally blind left eye. H/o PAD/stump complications w/ h/o wounds. Seizure disorder.   Previously followed in the [facility] but not seen there since 2018. Had compliance issues and required paramedicine.   Echo 07/2015: EF 15%, RV severely reduced R/LHC 07/2015: minimal non-obstructive CAD, normal CO 6.01. CI 2.7   Echo 09/2016: EF 20-25% Echo 02/2017: EF 15-20%, G3DD, RV mild- mod reduced  Echo 06/2020: EF 50-55%, RV normal   Recently admitted to [facility] 10/22 w/ acute CHF. Recurrence in the setting of poor med compliance as he had stopped taking all meds. EF back down again, 20-25%, RV moderately reduced w/ mod-severe TR.  Diuresed w/ IV Lasix and GDMT restarted. D/w wt 206 lb. Discharged to SNF.   He presents to [facility] clinic today for post hospital f/u. In wheel chair and primarily WC dependent. He has a Rt lower extremity prosthetic leg but no prosthesis for the left. He informs me that he will remain a permanent resident at his current SNF. No plans to return home.   From symptom standpoint, he continues to have some mild resting dyspnea and orthopnea. No PND. Chair weight was obtained at SNF earlier today and wt was down to 195 lb. No peripheral stump edema on exam.  However ReDs clip is elevated at 45%. BP is well controlled, 136/78. Per MAR, he has been receiving meds ordered at d/c. Tolerating meds ok w/o side effects. Denies CP.    Cardiac Testing  Echo 5/17: EF 15%, RV severely reduced Echo 09/2016: EF 20-25% Echo 02/2017: EF 15-20%, G3DD, RV mild- mod reduced  Echo 06/2020: EF 50-55%,  RV normal   [facility] 5/17 Mid LAD lesion, 20% stenosed.   Findings:   Ao = 135/87 (107) LV = 141/20/30 RA = 17 RV = 67/11/20 PA = 62/29 (41) PCW = 35 (v wave 45-50) Fick cardiac output/index = 6.0/2.7 PVR = 1.0 WU SVR = 1198  FA sat = 88% PA sat = 58%, 57%   Assessment: 1. Minimal non-obstructive CAD 2. Severe NICM with EF 15% by echo 3. Markedly elevated filling pressures with normal PVR 4. Normal cardiac output   Plan/Discussion:   He has severe NICM. Still markedly volume overload. Continue diuresis. Pulmonary HTN is all left-sided. PA pressures should normalize with diuresis.  Review of Systems: [y] = yes, [ ]  = no   General: Weight gain [ ] ; Weight loss [ ] ; Anorexia [ ] ; Fatigue [ ] ; Fever [ ] ; Chills [ ] ; Weakness [ ]   Cardiac: Chest pain/pressure [ ] ; Resting SOB [ ] ; Exertional SOB [ ] ; Orthopnea [ ] ; Pedal Edema [ ] ; Palpitations [ ] ; Syncope [ ] ; Presyncope [ ] ; Paroxysmal nocturnal dyspnea[ ]   Pulmonary: Cough [ ] ; Wheezing[ ] ; Hemoptysis[ ] ; Sputum [ ] ; Snoring [ ]   GI: Vomiting[ ] ; Dysphagia[ ] ; Melena[ ] ; Hematochezia [ ] ; Heartburn[ ] ; Abdominal pain [ ] ; Constipation [ ] ; Diarrhea [ ] ; BRBPR [ ]   GU: Hematuria[ ] ; Dysuria [ ] ; Nocturia[ ]   Vascular: Pain in legs with walking [ ] ; Pain in feet with lying flat [ ] ; Non-healing  sores [ ] ; Stroke [ ] ; TIA [ ] ; Slurred speech [ ] ;  Neuro: Headaches[ ] ; Vertigo[ ] ; Seizures[ ] ; Paresthesias[ ] ;Blurred vision [ ] ; Diplopia [ ] ; Vision changes [ ]   Ortho/Skin: Arthritis [ ] ; Joint pain [ ] ; Muscle pain [ ] ; Joint swelling [ ] ; Back Pain [ ] ; Rash [ ]   Psych: Depression[ ] ; Anxiety[ ]   Heme: Bleeding problems [ ] ; Clotting disorders [ ] ; Anemia [ ]   Endocrine: Diabetes [ ] ; Thyroid dysfunction[ ]    Past Medical History:  Diagnosis Date   Acute on chronic systolic heart failure, NYHA class 3 (HCC)    AKI (acute kidney injury) ([place])    Anemia    BKA stump complication (HCC)    Blind left eye    Blind left eye     Cardiomyopathy, dilated (HCC)    CHF (congestive heart failure) (HCC)    Combined systolic and diastolic congestive heart failure (HCC)    Congestive dilated cardiomyopathy ([place]) 07/11/2015   Diabetes mellitus    Diabetic neuropathy associated with type 2 diabetes mellitus ([place])    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     aspirin 81 MG EC tablet Take 81 mg by mouth daily.     atorvastatin (LIPITOR) 40 MG tablet Take 40 mg by mouth at bedtime.      bisoprolol (ZEBETA) 5 MG tablet Take 1 tablet (5 mg total) by mouth daily. 30 tablet 1   brimonidine (ALPHAGAN) 0.2 % ophthalmic solution PLACE 1 DROP INTO THE LEFT EYE 2 (TWO) TIMES DAILY. (Patient taking differently: 1 drop See admin instructions. Instill 1 drop into the left eye only at 9 AM and 8 PM. Instill 1 drop into both eyes at 6 AM, 2 PM, and 10 PM.) 5 mL 0   divalproex (DEPAKOTE) 125 MG DR tablet Take 125 mg by mouth in the morning and at bedtime.     dorzolamide-timolol (COSOPT) 22.3-6.8 MG/ML ophthalmic solution Place 1 drop into the left eye 2 (two) times daily. 10 mL 12   furosemide (LASIX) 40 MG tablet Take 1 tablet (40 mg total) by mouth daily. 30 tablet    GLUCAGEN HYPOKIT 1 MG SOLR injection Inject 1 mg into the muscle once as needed for low blood sugar.     hydrALAZINE (APRESOLINE) 50 MG tablet Take 1 tablet (50 mg total) by mouth every 8 (eight) hours.     isosorbide mononitrate (IMDUR) 30 MG 24 hr tablet Take 1 tablet (30 mg total) by mouth daily.      levETIRAcetam (KEPPRA) 750 MG tablet Take 1 tablet (750 mg total) by mouth 2 (two) times daily. 60 tablet 0   metFORMIN (GLUCOPHAGE) 1000 MG tablet Take 1,000 mg by mouth 2 (two) times daily with a meal.     pantoprazole (PROTONIX) 20 MG tablet Take 20 mg by mouth daily before breakfast.     Polyethyl Glycol-Propyl Glycol (SYSTANE) 0.4-0.3 % SOLN Place 1  drop into both eyes every 8 (eight) hours as needed (for dryness).     polyethylene glycol (MIRALAX / GLYCOLAX) packet Take 17 g by mouth 2 (two) times daily. (Patient taking differently: Take 17 g by mouth in the morning.) 14 each 0   RHOPRESSA 0.02 % SOLN Place 1 drop into the left eye at bedtime.     sacubitril-valsartan (ENTRESTO) 24-26 MG Take 1 tablet by mouth 2 (two) times daily. 60 tablet 3   senna-docusate (SENOKOT-S) 8.6-50 MG tablet Take 1 tablet by mouth at bedtime.     sertraline (ZOLOFT) 50 MG tablet Take 50 mg by mouth in the morning.     sitaGLIPtin (JANUVIA) 100 MG tablet Take 100 mg by mouth daily.     tamsulosin (FLOMAX) 0.4 MG CAPS capsule Take 2 capsules (0.8 mg total) by mouth daily after supper. 60 capsule 0   White Petrolatum-Mineral Oil (ALTALUBE) 85-15 % OINT Place 1 application into both eyes at bedtime.     No current facility-administered medications for this encounter.    No Known Allergies    Social History   Socioeconomic History   Marital status: Married    Spouse name: Not on file   Number of children: 0   Years of education: 12   Highest education level: Not on file  Occupational History   Not on file  Tobacco Use   Smoking status: Former    Years: 20.00    Types: Cigarettes    Quit date: 05/10/2000    Years since quitting: 20.6   Smokeless tobacco: Former    Quit date: 02/22/2000  Vaping Use   Vaping Use: Never used  Substance and Sexual Activity   Alcohol use: No    Alcohol/week: 0.0 standard drinks   Drug use: No   Sexual activity: Not on file  Other Topics Concern   Not on file   Social History Narrative   ** Merged History Encounter **       Lives alone.    Caffeine use: Tea/soda daily   Coffee sometimes    Left handed    Social Determinants of Health   Financial Resource Strain: Not on file  Food Insecurity: Not on file  Transportation Needs: Not on file  Physical Activity: Not on file  Stress: Not on file  Social Connections: Not on file  Intimate Partner Violence: Not on file      Family History  Problem Relation Age of Onset   Cancer Mother    Peripheral vascular disease Father     Vitals:   12/27/20 0931  BP: 136/78  Pulse: 65  SpO2: 95%  Weight: 88.5 kg    PHYSICAL EXAM: ReDs Clip 45% General:  chronically ill appearing in WC. No respiratory difficulty HEENT: normal Neck: supple. JVD 10 cm. Carotids 2+ bilat; no bruits. No lymphadenopathy or thryomegaly appreciated. Cor: PMI nondisplaced. Regular rate & rhythm. No rubs, gallops or murmurs. Lungs: decreased BS at the bases bilaterally  Abdomen: soft, nontender, nondistended. No hepatosplenomegaly. No bruits or masses. Good bowel sounds. Extremities: no cyanosis, clubbing, rash, no stump edema, bilateral BKA w/ left leg prosthesis.  Neuro: alert & oriented x 3, cranial nerves grossly intact. moves all 4 extremities w/o difficulty. Affect pleasant.  ECG: not performed  1. Chronic Systolic Heart Failure - NICM - Echo 07/2015: EF 15%, RV severely reduced - R/LHC 07/2015: minimal non-obstructive CAD, normal CO 6.01. CI 2.7  - Echo 09/2016: EF 20-25% - Echo 02/2017: EF 15-20%, G3DD,  RV mild- mod reduced  - Echo 06/2020: EF normalized 50-55%, RV normal  - Echo 10/22 EF back down 20-25% in setting of stopping meds  - Functional Classification Limited. Primarily WC bound, though he has endorsed some recent SOB at rest w/ orthopnea. Continues w/ fluid overload on exam and by ReDs Clip, 45%. - Start Entresto 24-26 mg bid - Check BMP today and again in 7 days - Plan to add Arlyce Harman next visit if  SCr/K remains stable - Also consider future addition of SLGT2i (would need to coordinate w/ PCP given multidrug regimen) - Continue Bisoprolol 5 mg daily  - Continue Imdur 30 - Continue hydralazine 50 mg tid  - Given h/o poor compliance, severe PAD s/p bilateral BKA w/ h/o stump complications/wound infections, blindness, wheel chair/ SNF dependent, he is not a candidate for advanced heart failure therapies. Hopefully EF will improve w/ better med compliance now that he has assistance at [facility]. Will plan to see him back in [facility] clinic to continue w/ further med titration, then will plan to refer to cardiology for further management.  - Poor candidate for ICD given risk for infection w/ chronic stump wounds  2. Stage IIIa CKD - most recent baseline SCr ~1.5 - Check BMP today and again in 7 days w/ Entresto addition   3. HTN - controlled on current regimen - adding Entresto per above - BMP today   4. Non-obstructive CAD - LHC 2017 w/ minimal 1V non-obstructive CAD, 20% mid LAD - Stable w/o CP  - on ASA/statin/? blocker   5. PVD - s/p bilateral BKA - ASA/statin   6. H/o Poor Compliance  - previously require paramedicine - now SNF resident where daily meds administered.   7. Type 2DM  - well controlled on current regimen, Hgb A1c 6.1 - on insulin, metformin + januvia - may benefit from CV standpoint from addition of SLGT2i (would need to coordinate w/ PCP given multidrug regimen)   ASSESSMENT & PLAN:  NYHA N/A. Primarily WC bound.  GDMT  Diuretic- Lasix 40 mg daily  BB- Bisoprolol 5 mg daily  Ace/ARB/ARNI- Start Entresto 24-26 mg bid  MRA- add next visit pending SCr/K  SGLT2i not yet (see above)     Referred to HFSW (PCP, Medications, Transportation, ETOH Abuse, Drug Abuse, Insurance, Museum/gallery curator ): No  Refer to Pharmacy: No  Refer to Home Health: No  Refer to Advanced Heart Failure Clinic: No  Refer to General Cardiology: Yes   Follow up  w/ TOC in 7 days for further med  titration

## 2020-12-29 ENCOUNTER — Inpatient Hospital Stay (HOSPITAL_COMMUNITY)
Admission: EM | Admit: 2020-12-29 | Discharge: 2021-01-06 | DRG: 291 | Disposition: A | Discharge status: Skilled Nursing Facility | Payer: Medicaid Other | Attending: Podiatry | Admitting: Podiatry

## 2020-12-29 ENCOUNTER — Emergency Department (HOSPITAL_COMMUNITY): Payer: Medicaid Other

## 2020-12-29 DIAGNOSIS — E785 Hyperlipidemia, unspecified: Secondary | ICD-10-CM | POA: Diagnosis not present

## 2020-12-29 DIAGNOSIS — I509 Heart failure, unspecified: Secondary | ICD-10-CM

## 2020-12-29 DIAGNOSIS — Z7982 Long term (current) use of aspirin: Secondary | ICD-10-CM

## 2020-12-29 DIAGNOSIS — R1031 Right lower quadrant pain: Secondary | ICD-10-CM | POA: Diagnosis present

## 2020-12-29 DIAGNOSIS — E1140 Type 2 diabetes mellitus with diabetic neuropathy, unspecified: Secondary | ICD-10-CM | POA: Diagnosis present

## 2020-12-29 DIAGNOSIS — Z20822 Contact with and (suspected) exposure to covid-19: Secondary | ICD-10-CM | POA: Diagnosis present

## 2020-12-29 DIAGNOSIS — Z89512 Acquired absence of left leg below knee: Secondary | ICD-10-CM

## 2020-12-29 DIAGNOSIS — I5043 Acute on chronic combined systolic (congestive) and diastolic (congestive) heart failure: Secondary | ICD-10-CM | POA: Diagnosis present

## 2020-12-29 DIAGNOSIS — I130 Hypertensive heart and chronic kidney disease with heart failure and stage 1 through stage 4 chronic kidney disease, or unspecified chronic kidney disease: Principal | ICD-10-CM | POA: Diagnosis present

## 2020-12-29 DIAGNOSIS — I959 Hypotension, unspecified: Secondary | ICD-10-CM | POA: Diagnosis not present

## 2020-12-29 DIAGNOSIS — E669 Obesity, unspecified: Secondary | ICD-10-CM | POA: Diagnosis present

## 2020-12-29 DIAGNOSIS — I071 Rheumatic tricuspid insufficiency: Secondary | ICD-10-CM | POA: Diagnosis present

## 2020-12-29 DIAGNOSIS — N179 Acute kidney failure, unspecified: Secondary | ICD-10-CM | POA: Diagnosis present

## 2020-12-29 DIAGNOSIS — G40909 Epilepsy, unspecified, not intractable, without status epilepticus: Secondary | ICD-10-CM | POA: Diagnosis present

## 2020-12-29 DIAGNOSIS — Z794 Long term (current) use of insulin: Secondary | ICD-10-CM

## 2020-12-29 DIAGNOSIS — N1832 Chronic kidney disease, stage 3b: Secondary | ICD-10-CM | POA: Diagnosis present

## 2020-12-29 DIAGNOSIS — Z7984 Long term (current) use of oral hypoglycemic drugs: Secondary | ICD-10-CM

## 2020-12-29 DIAGNOSIS — R600 Localized edema: Secondary | ICD-10-CM | POA: Diagnosis present

## 2020-12-29 DIAGNOSIS — Z89511 Acquired absence of right leg below knee: Secondary | ICD-10-CM

## 2020-12-29 DIAGNOSIS — I5023 Acute on chronic systolic (congestive) heart failure: Secondary | ICD-10-CM | POA: Diagnosis not present

## 2020-12-29 DIAGNOSIS — H409 Unspecified glaucoma: Secondary | ICD-10-CM | POA: Diagnosis present

## 2020-12-29 DIAGNOSIS — Z87891 Personal history of nicotine dependence: Secondary | ICD-10-CM

## 2020-12-29 DIAGNOSIS — E1142 Type 2 diabetes mellitus with diabetic polyneuropathy: Secondary | ICD-10-CM | POA: Diagnosis not present

## 2020-12-29 DIAGNOSIS — I420 Dilated cardiomyopathy: Secondary | ICD-10-CM | POA: Diagnosis present

## 2020-12-29 DIAGNOSIS — Z6833 Body mass index (BMI) 33.0-33.9, adult: Secondary | ICD-10-CM

## 2020-12-29 DIAGNOSIS — Z9114 Patient's other noncompliance with medication regimen: Secondary | ICD-10-CM

## 2020-12-29 DIAGNOSIS — Z79899 Other long term (current) drug therapy: Secondary | ICD-10-CM

## 2020-12-29 DIAGNOSIS — R0902 Hypoxemia: Secondary | ICD-10-CM | POA: Diagnosis present

## 2020-12-29 DIAGNOSIS — Z8249 Family history of ischemic heart disease and other diseases of the circulatory system: Secondary | ICD-10-CM

## 2020-12-29 DIAGNOSIS — H5462 Unqualified visual loss, left eye, normal vision right eye: Secondary | ICD-10-CM | POA: Diagnosis present

## 2020-12-29 DIAGNOSIS — E1122 Type 2 diabetes mellitus with diabetic chronic kidney disease: Secondary | ICD-10-CM | POA: Diagnosis present

## 2020-12-29 DIAGNOSIS — Z8673 Personal history of transient ischemic attack (TIA), and cerebral infarction without residual deficits: Secondary | ICD-10-CM

## 2020-12-29 DIAGNOSIS — Z66 Do not resuscitate: Secondary | ICD-10-CM | POA: Diagnosis present

## 2020-12-29 DIAGNOSIS — L89892 Pressure ulcer of other site, stage 2: Secondary | ICD-10-CM | POA: Diagnosis present

## 2020-12-29 DIAGNOSIS — I214 Non-ST elevation (NSTEMI) myocardial infarction: Secondary | ICD-10-CM

## 2020-12-29 DIAGNOSIS — L8990 Pressure ulcer of unspecified site, unspecified stage: Secondary | ICD-10-CM | POA: Diagnosis present

## 2020-12-29 DIAGNOSIS — I10 Essential (primary) hypertension: Secondary | ICD-10-CM

## 2020-12-29 DIAGNOSIS — N400 Enlarged prostate without lower urinary tract symptoms: Secondary | ICD-10-CM | POA: Diagnosis present

## 2020-12-29 LAB — RESP PANEL BY RT-PCR (FLU A&B, COVID) ARPGX2
Influenza A by PCR: NEGATIVE
Influenza B by PCR: NEGATIVE
SARS Coronavirus 2 by RT PCR: NEGATIVE

## 2020-12-29 LAB — COMPREHENSIVE METABOLIC PANEL
ALT: 42 U/L (ref 0–44)
AST: 34 U/L (ref 15–41)
Albumin: 3.1 g/dL — ABNORMAL LOW (ref 3.5–5.0)
Alkaline Phosphatase: 90 U/L (ref 38–126)
Anion gap: 9 (ref 5–15)
BUN: 30 mg/dL — ABNORMAL HIGH (ref 8–23)
CO2: 24 mmol/L (ref 22–32)
Calcium: 8.7 mg/dL — ABNORMAL LOW (ref 8.9–10.3)
Chloride: 104 mmol/L (ref 98–111)
Creatinine, Ser: 1.65 mg/dL — ABNORMAL HIGH (ref 0.61–1.24)
GFR, Estimated: 46 mL/min — ABNORMAL LOW (ref >60)
Glucose, Bld: 133 mg/dL — ABNORMAL HIGH (ref 70–99)
Potassium: 4.1 mmol/L (ref 3.5–5.1)
Sodium: 137 mmol/L (ref 135–145)
Total Bilirubin: 1.4 mg/dL — ABNORMAL HIGH (ref 0.3–1.2)
Total Protein: 6.3 g/dL — ABNORMAL LOW (ref 6.5–8.1)

## 2020-12-29 LAB — URINALYSIS, ROUTINE W REFLEX MICROSCOPIC
Bilirubin Urine: NEGATIVE
Glucose, UA: NEGATIVE mg/dL
Ketones, ur: NEGATIVE mg/dL
Leukocytes,Ua: NEGATIVE
Nitrite: NEGATIVE
Protein, ur: >=300 mg/dL — AB
Specific Gravity, Urine: 1.023 (ref 1.005–1.030)
pH: 5 (ref 5.0–8.0)

## 2020-12-29 LAB — CBC
HCT: 40.8 % (ref 39.0–52.0)
Hemoglobin: 12.7 g/dL — ABNORMAL LOW (ref 13.0–17.0)
MCH: 26.2 pg (ref 26.0–34.0)
MCHC: 31.1 g/dL (ref 30.0–36.0)
MCV: 84.3 fL (ref 80.0–100.0)
Platelets: 365 10*3/uL (ref 150–400)
RBC: 4.84 MIL/uL (ref 4.22–5.81)
RDW: 15.9 % — ABNORMAL HIGH (ref 11.5–15.5)
WBC: 10 10*3/uL (ref 4.0–10.5)
nRBC: 0 % (ref 0.0–0.2)

## 2020-12-29 LAB — TROPONIN I (HIGH SENSITIVITY)
Troponin I (High Sensitivity): 354 ng/L (ref <18)
Troponin I (High Sensitivity): 485 ng/L (ref <18)
Troponin I (High Sensitivity): 605 ng/L (ref <18)
Troponin I (High Sensitivity): 626 ng/L (ref <18)

## 2020-12-29 LAB — GLUCOSE, CAPILLARY
Glucose-Capillary: 113 mg/dL — ABNORMAL HIGH (ref 70–99)
Glucose-Capillary: 126 mg/dL — ABNORMAL HIGH (ref 70–99)

## 2020-12-29 LAB — LIPASE, BLOOD: Lipase: 21 U/L (ref 11–51)

## 2020-12-29 LAB — BRAIN NATRIURETIC PEPTIDE: B Natriuretic Peptide: 2475.4 pg/mL — ABNORMAL HIGH (ref 0.0–100.0)

## 2020-12-29 LAB — LACTIC ACID, PLASMA: Lactic Acid, Venous: 1.4 mmol/L (ref 0.5–1.9)

## 2020-12-29 MED ORDER — POLYETHYLENE GLYCOL 3350 17 G PO PACK
17.0000 g | PACK | Freq: Two times a day (BID) | ORAL | Status: DC
  Administered 2020-12-29 – 2021-01-03 (×3): 17 g via ORAL
  Filled 2020-12-29 (×8): qty 1

## 2020-12-29 MED ORDER — DIVALPROEX SODIUM 125 MG PO DR TAB
125.0000 mg | DELAYED_RELEASE_TABLET | Freq: Two times a day (BID) | ORAL | Status: DC
  Administered 2020-12-29 – 2021-01-05 (×14): 125 mg via ORAL
  Filled 2020-12-29 (×15): qty 1

## 2020-12-29 MED ORDER — LEVETIRACETAM 750 MG PO TABS
750.0000 mg | ORAL_TABLET | Freq: Two times a day (BID) | ORAL | Status: DC
  Administered 2020-12-29 – 2021-01-05 (×13): 750 mg via ORAL
  Filled 2020-12-29 (×15): qty 1

## 2020-12-29 MED ORDER — ISOSORBIDE MONONITRATE ER 30 MG PO TB24
30.0000 mg | ORAL_TABLET | Freq: Every day | ORAL | Status: DC
  Administered 2020-12-30 – 2021-01-05 (×6): 30 mg via ORAL
  Filled 2020-12-29 (×7): qty 1

## 2020-12-29 MED ORDER — SENNOSIDES-DOCUSATE SODIUM 8.6-50 MG PO TABS
1.0000 | ORAL_TABLET | Freq: Every day | ORAL | Status: DC
  Administered 2020-12-29 – 2021-01-04 (×4): 1 via ORAL
  Filled 2020-12-29 (×4): qty 1

## 2020-12-29 MED ORDER — IPRATROPIUM-ALBUTEROL 0.5-2.5 (3) MG/3ML IN SOLN
3.0000 mL | Freq: Once | RESPIRATORY_TRACT | Status: AC
  Administered 2020-12-29: 3 mL via RESPIRATORY_TRACT
  Filled 2020-12-29: qty 3

## 2020-12-29 MED ORDER — FUROSEMIDE 10 MG/ML IJ SOLN
40.0000 mg | Freq: Once | INTRAMUSCULAR | Status: AC
  Administered 2020-12-29: 40 mg via INTRAVENOUS
  Filled 2020-12-29: qty 4

## 2020-12-29 MED ORDER — ASPIRIN EC 81 MG PO TBEC
81.0000 mg | DELAYED_RELEASE_TABLET | Freq: Every day | ORAL | Status: DC
  Administered 2020-12-29 – 2021-01-05 (×7): 81 mg via ORAL
  Filled 2020-12-29 (×6): qty 1

## 2020-12-29 MED ORDER — TAMSULOSIN HCL 0.4 MG PO CAPS
0.8000 mg | ORAL_CAPSULE | Freq: Every day | ORAL | Status: DC
  Administered 2020-12-29 – 2021-01-05 (×7): 0.8 mg via ORAL
  Filled 2020-12-29 (×9): qty 2

## 2020-12-29 MED ORDER — ALUM & MAG HYDROXIDE-SIMETH 200-200-20 MG/5ML PO SUSP
30.0000 mL | Freq: Once | ORAL | Status: AC
  Administered 2020-12-29: 30 mL via ORAL
  Filled 2020-12-29: qty 30

## 2020-12-29 MED ORDER — INSULIN ASPART 100 UNIT/ML IJ SOLN
0.0000 [IU] | Freq: Three times a day (TID) | INTRAMUSCULAR | Status: DC
  Administered 2020-12-30 (×2): 1 [IU] via SUBCUTANEOUS

## 2020-12-29 MED ORDER — ATORVASTATIN CALCIUM 40 MG PO TABS
40.0000 mg | ORAL_TABLET | Freq: Every day | ORAL | Status: DC
  Administered 2020-12-29 – 2021-01-05 (×8): 40 mg via ORAL
  Filled 2020-12-29 (×8): qty 1

## 2020-12-29 MED ORDER — SERTRALINE HCL 50 MG PO TABS
50.0000 mg | ORAL_TABLET | Freq: Every morning | ORAL | Status: DC
  Administered 2020-12-31 – 2021-01-05 (×6): 50 mg via ORAL
  Filled 2020-12-29 (×5): qty 1

## 2020-12-29 MED ORDER — ACETAMINOPHEN 325 MG PO TABS
650.0000 mg | ORAL_TABLET | Freq: Once | ORAL | Status: AC
  Administered 2020-12-29: 650 mg via ORAL
  Filled 2020-12-29: qty 2

## 2020-12-29 MED ORDER — ENOXAPARIN SODIUM 40 MG/0.4ML IJ SOSY: 40.0000 mg | PREFILLED_SYRINGE | INTRAMUSCULAR | Status: DC

## 2020-12-29 MED ORDER — BISOPROLOL FUMARATE 5 MG PO TABS
5.0000 mg | ORAL_TABLET | Freq: Every day | ORAL | Status: DC
  Administered 2020-12-29 – 2021-01-05 (×7): 5 mg via ORAL
  Filled 2020-12-29 (×8): qty 1

## 2020-12-29 MED ORDER — HYDRALAZINE HCL 50 MG PO TABS
50.0000 mg | ORAL_TABLET | Freq: Three times a day (TID) | ORAL | Status: DC
  Administered 2020-12-30 – 2021-01-03 (×14): 50 mg via ORAL
  Filled 2020-12-29 (×15): qty 1

## 2020-12-29 MED ORDER — SACUBITRIL-VALSARTAN 24-26 MG PO TABS
1.0000 | ORAL_TABLET | Freq: Two times a day (BID) | ORAL | Status: DC
  Administered 2020-12-30 – 2021-01-05 (×13): 1 via ORAL
  Filled 2020-12-29 (×14): qty 1

## 2020-12-29 NOTE — Hospital Course (Addendum)
[name] is a 65 year old male who presented with abdominal pain, shortness of breath concerning for CHF exacerbation.  Past medical history significant for HFrEF, HTN, HLD, type 2 diabetes, is s/p bilateral BKA, CKD stage III, prior CVA, seizure disorder   Acute on Chronic HFrEF Patient presented with abdominal pain and shortness of breath over the past week. He was recently admitted for acute CHF exacerbation two weeks ago at which time echo showed EF 20-25% with global LV hypokinesis and grade III diastolic dysfunction, as well as mod to severe TR. Patient was hypoxic to 88% on room air in the ED, which improved with 2L nasal cannula. Remainder of vitals stable. BNP slightly increased from 2 weeks ago (2400 up from 2200) CXR showed cardiomegaly and interstitial edema. Pt was treated with lasix up to 120 mg IV BID which was then switched to 80 mg Lasix oral twice daily, continued on home Imdur, Entresto, and Bisoprolol. By discharge he was on 40 mg BID of lasix. Ultimately, pt had a total of 7.6 L UOP. He had improved respiratory and fluid status.  Pt was discharged with new SGLT2i***    CKD stage IIIb Patient had rise in Cr from 1.47 on admission to 1.7 at discharge, baseline 1.2-1.4. He also had a drop in GFR from a baseline > 60 to 45 by discharge. The drop in GFR and rise in Cr was 2/2 diureses with lasix. Patient will need close follow up with PCP to trend Cr and GFR. Electrolytes remained stable over admission.  Elevated Troponin Troponin was elevated from 354 to 1208 and then downtrended to 897 over admission. EKG showed no changes and patient had no chest pain. Heparin gtt  was ordered for total of 48 hrs. Cardiology suggest this was d/t demand ischemia from known heart failure.  Patient did not require left and right heart caths. He will need out patient follow up with cardiology to be evaluated for ischemia.   T2DM Home Januvia and Metformin were held and pt was treated with sSSI and  had CBG monitoring qAC and qHS. CBG were well controlled during admission  Hypertension Hydralazine discontinued do to low-normal BP Remainder of home medications continued in hospital.  All other chronic health conditions were stable and treated with home medications  Issues for Follow up: Ensure follow up outpatient with cardiology for evaluation of ischemia Outpatient consider starting SGLT2, and spironolactone Close follow up with PCP to check Cr and electrolytes Consider restarting hydralazine if appropriate

## 2020-12-29 NOTE — Progress Notes (Signed)
Troponin's rising 354>626. Pt denying chest pain but has new oxygen requirement. Watched rhythm on tele and without acute ST or T wave changes.  ?NSTEMI vs demand ischemia in the setting of CHF exacerbation. Given additional Lasix 40mg  IV at ~2300.   Consulted Cardiology fellow Narcisse who will evaluate patient and provide additional recommendation's.    Lyndee Hensen, DO PGY-3, [facility] 12/29/2020

## 2020-12-29 NOTE — ED Provider Notes (Addendum)
Emergency Medicine Provider Triage Evaluation Note  [name] , a 65 y.o. male  was evaluated in triage.  Pt complains of lower abdominal pain x 7-10 days, slight constipation. Unsure why symptoms started. Pain moves to right side. Associated with nausea, no vomiting, cough, SHOB. On O2 from EMS, not on O2 at home.   Review of Systems  Positive: Abdominal pain, SHOB, cough Negative: Changes in bowel habits  Physical Exam  BP (!) 155/120 (BP Location: Right Arm)   Pulse 79   Temp 100 F (37.8 C)   Resp (!) 3   SpO2 100%  Gen:   Awake, no distress   Resp:  Mildly SHOB with talking, mild wheezing MSK:   Moves extremities without difficulty  Other:  Bilateral BKA  Medical Decision Making  Medically screening exam initiated at 11:57 AM.  Appropriate orders placed.  [name] was informed that the remainder of the evaluation will be completed by another provider, this initial triage assessment does not replace that evaluation, and the importance of remaining in the ED until their evaluation is complete.     Tacy Learn, PA-C 12/29/20 1159    Tacy Learn, PA-C 12/29/20 1242    Pattricia Boss, MD 12/29/20 [phone]

## 2020-12-29 NOTE — ED Provider Notes (Signed)
[facility] EMERGENCY DEPARTMENT Provider Note   CSN: [id number] Arrival date & time: 12/29/20  1116     History Chief Complaint  Patient presents with   Abdominal Pain   Shortness of Breath   Cough    [name] is a 65 y.o. male.  The history is provided by the patient and medical records.  Abdominal Pain Associated symptoms: cough, nausea and shortness of breath   Associated symptoms: no chest pain, no chills, no constipation, no diarrhea, no dysuria, no fever, no hematuria, no sore throat and no vomiting   Shortness of Breath Associated symptoms: abdominal pain and cough   Associated symptoms: no chest pain, no fever, no rash, no sore throat and no vomiting   Cough Associated symptoms: shortness of breath   Associated symptoms: no chest pain, no chills, no fever, no rash and no sore throat    Patient is a 65 year old male with a complicated chronic PMH significant for type II DM, BL AKA's, cardiomyopathy, CHF (last EF 20 to 25%), blindness, HTN, PAD, and others as below who presents to the ED via EMS with complaints of abdominal pain.  Patient reports that for the last 1 days, he has had intermittent right lower quadrant abdominal pain.  It is worsened by eating and improves on its own spontaneously.  He is unable to further describe the pain.  It is nonradiating.  It is associated with intermittent nausea.  He also reports chronic worsening shortness of breath over the past week.  He is unable to state whether or not he is taking his home Lasix.  He denies any fevers or chills, rashes, abdominal distention, chest pain, cough, diarrhea, hematochezia or melena, emesis, dysuria or hematuria, or any other complaints.  He has continued to tolerate p.o.  Last BM was yesterday.  He lives in a facility and has an upcoming appt with his PCP.  Patient does not wear oxygen at baseline.  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   Left body jerking   Hypertension    Neuropathy    Noncompliance with medications    Open knee wound 10/2015   on rt bka   PAD (peripheral artery disease) (HCC)    Protein calorie malnutrition (HCC)    Shortness of breath dyspnea     Patient Active Problem List   Diagnosis Date Noted   Acute on chronic heart failure ([place]) 12/29/2020   Elevated troponin 12/17/2020   Malnutrition of moderate degree 06/24/2020   Type 2 diabetes mellitus with diabetic peripheral angiopathy and gangrene, without long-term current use of insulin (HCC)    Necrotizing fasciitis of lower leg ([place]) 06/22/2020   Type 2 diabetes mellitus with foot ulcer and gangrene ([place])    Abscess of left foot    Foot infection    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   Irregular heart rate  Acute encephalopathy 01/31/2017   Cellulitis of right upper extremity    Anasarca    Altered mental status    Slurred speech    Hypothermia 01/30/2017   Pressure injury of skin 12/21/2016   CHF exacerbation ([place]) 12/20/2016   Peripheral edema    Benign prostatic hyperplasia with lower urinary tract symptoms    Embolic stroke ([place]) 27/74/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   Cardiomyopathy, dilated ([place]) 10/19/2016   Acute on chronic systolic heart failure, NYHA class 3 ([place]) 10/19/2016   Moderate to severe pulmonary hypertension ([place]) 10/19/2016   Severe tricuspid regurgitation by prior echocardiogram 10/19/2016   Diabetes mellitus type 2 in obese ([place]) 10/19/2016   HLD (hyperlipidemia)  10/19/2016   Seizure ([place]) 10/19/2016   Fluid overload 10/14/2016   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 ([place]) 10/09/2016   Cardiorenal syndrome with renal failure 09/28/2016   Acute urinary retention 09/26/2016   Acute on chronic combined systolic and diastolic CHF (congestive heart failure) ([place]) 09/26/2016   Scrotal edema 09/24/2016   Multiple open wounds of lower leg, initial encounter 09/24/2016   Tinea of groin 09/24/2016   Chronic kidney disease (CKD), stage III (moderate) ([place]) 06/07/2016   Chronic combined systolic and diastolic congestive heart failure (HCC)    Protein calorie malnutrition ([place]) 07/12/2015   Congestive dilated cardiomyopathy ([place]) 07/11/2015   Pressure ulcer 07/09/2015   Open knee wound 07/06/2015   History of right below knee amputation ([place]) 02/22/2015   PAD (peripheral artery disease) ([place]) 02/22/2015   Blindness of left eye 86/76/7209   Complications, amputation stump late ([place]) 08/06/2014   Diabetic neuropathy associated with type 2 diabetes mellitus ([place]) 12/01/2013   Noncompliance with medications 07/13/2010   OTHER SPEC TYPES SCHIZOPHRENIA UNSPEC CONDITION 05/24/2009   Obesity, unspecified 06/09/2007   DM (diabetes mellitus), type 2, uncontrolled 05/02/2006   Essential hypertension 05/02/2006    Past Surgical History:  Procedure Laterality Date   ABDOMINAL AORTOGRAM W/LOWER EXTREMITY N/A 03/25/2017   Procedure: ABDOMINAL AORTOGRAM W/LOWER EXTREMITY;  Surgeon: Angelia Mould, MD;  Location: [facility];  Service: Cardiovascular;  Laterality: N/A;   AMPUTATION Right 09/26/2013   Procedure: AMPUTATION BELOW KNEE;  Surgeon: Rozanna Box, MD;  Location: [facility];  Service: Orthopedics;  Laterality: Right;   AMPUTATION Right 05/01/2015   Procedure: REVISION OF RIGHT TRANSTIBIAL AMPUTATION ;  Surgeon: Newt Minion, MD;  Location: [facility];  Service: Orthopedics;  Laterality: Right;    AMPUTATION Left 06/24/2020   Procedure: LEFT BELOW KNEE AMPUTATION;  Surgeon: Newt Minion, MD;  Location: [facility];  Service: Orthopedics;  Laterality: Left;   APPLICATION OF WOUND VAC  08/10/2016   Procedure: APPLICATION OFI NCISONAL  WOUND VAC;  Surgeon: Newt Minion, MD;  Location: [facility];  Service: Orthopedics;;   CARDIAC CATHETERIZATION N/A 07/13/2015   Procedure: Right/Left Heart Cath and Coronary Angiography;  Surgeon: Jolaine Artist, MD;  Location: [facility];  Service: Cardiovascular;  Laterality: N/A;   COLONOSCOPY     I & D EXTREMITY Right 09/24/2013   Procedure: IRRIGATION AND DEBRIDEMENT RIGHT FOOT ULCER;  Surgeon: Renette Butters, MD;  Location: [facility];  Service: Orthopedics;  Laterality: Right;   I & D EXTREMITY Right 09/26/2013   Procedure: Repeat IRRIGATION AND DEBRIDEMENT Right Foot Ulcer;  Surgeon: Rozanna Box, MD;  Location: [facility];  Service: Orthopedics;  Laterality: Right;   MULTIPLE TOOTH EXTRACTIONS     SKIN SPLIT GRAFT Right 08/10/2016  Procedure: SKIN GRAFT SPLIT THICKNESS RIGHT LEG;  Surgeon: Newt Minion, MD;  Location: [facility];  Service: Orthopedics;  Laterality: Right;   STUMP REVISION Right 04/20/2015   Procedure: Revision Right Below Knee Amputation, Apply Wound VAC;  Surgeon: Newt Minion, MD;  Location: [facility];  Service: Orthopedics;  Laterality: Right;   TONSILLECTOMY         Family History  Problem Relation Age of Onset   Cancer Mother    Peripheral vascular disease Father     Social History   Tobacco Use   Smoking status: Former    Years: 20.00    Types: Cigarettes    Quit date: 05/10/2000    Years since quitting: 20.6   Smokeless tobacco: Former    Quit date: 02/22/2000  Vaping Use   Vaping Use: Never used  Substance Use Topics   Alcohol use: No    Alcohol/week: 0.0 standard drinks   Drug use: No    Home Medications Prior to Admission medications   Medication Sig Start Date End Date Taking? Authorizing Provider  acetaminophen  (TYLENOL) 325 MG tablet Take 650 mg by mouth every 6 (six) hours as needed for mild pain (AND CANNOT EXCEED 3,000 MG/24 HOURS OF TYLENOL FROM ALL COMBINED SOURCES).   Yes [provider]  Alogliptin Benzoate 25 MG TABS Take 25 mg by mouth daily.   Yes [provider]  aspirin 81 MG EC tablet Take 81 mg by mouth daily.   Yes [provider]  atorvastatin (LIPITOR) 40 MG tablet Take 40 mg by mouth at bedtime.  08/25/15  Yes [provider]  bisacodyl (DULCOLAX) 5 MG EC tablet Take 10 mg by mouth daily as needed for moderate constipation.   Yes [provider]  bisoprolol (ZEBETA) 5 MG tablet Take 1 tablet (5 mg total) by mouth daily. 10/19/16  Yes Hosie Poisson, MD  divalproex (DEPAKOTE) 125 MG DR tablet Take 125 mg by mouth in the morning and at bedtime.   Yes [provider]  dorzolamide-timolol (COSOPT) 22.3-6.8 MG/ML ophthalmic solution Place 1 drop into the left eye 2 (two) times daily. 09/28/13  Yes Bacigalupo, Dionne Bucy, MD  furosemide (LASIX) 40 MG tablet Take 1 tablet (40 mg total) by mouth daily. 12/21/20  Yes Alcus Dad, MD  GLUCAGEN HYPOKIT 1 MG SOLR injection Inject 1 mg into the muscle once as needed for low blood sugar.   Yes [provider]  hydrALAZINE (APRESOLINE) 50 MG tablet Take 1 tablet (50 mg total) by mouth every 8 (eight) hours. Patient taking differently: Take 75 mg by mouth every 8 (eight) hours. 12/20/20  Yes Alcus Dad, MD  isosorbide mononitrate (IMDUR) 30 MG 24 hr tablet Take 1 tablet (30 mg total) by mouth daily. 12/21/20  Yes Alcus Dad, MD  levETIRAcetam (KEPPRA) 750 MG tablet Take 1 tablet (750 mg total) by mouth 2 (two) times daily. 10/20/16  Yes Sheikh, Omair Latif, DO  metFORMIN (GLUCOPHAGE) 1000 MG tablet Take 1,000 mg by mouth 2 (two) times daily with a meal.   Yes [provider]  pantoprazole (PROTONIX) 20 MG tablet Take 20 mg by mouth daily before breakfast.   Yes [provider]  Polyethyl Glycol-Propyl Glycol (SYSTANE) 0.4-0.3 % GEL ophthalmic gel Place 1 application into both eyes every 8 (eight) hours.   Yes [provider]  polyethylene glycol (MIRALAX / GLYCOLAX) packet Take 17 g by mouth 2 (two) times daily. 02/07/17  Yes Caroline More, DO  RHOPRESSA 0.02 % SOLN Place 1 drop into the left eye at bedtime.   Yes [provider]  sacubitril-valsartan (ENTRESTO) 24-26 MG Take 1 tablet by mouth 2 (two) times daily. 12/27/20  Yes Simmons, Brittainy M, PA-C  senna-docusate (SENOKOT-S) 8.6-50 MG tablet Take 1 tablet by mouth at bedtime.   Yes [provider]  sertraline (ZOLOFT) 50 MG tablet Take 50 mg by mouth in the morning.   Yes [provider]  tamsulosin (FLOMAX) 0.4 MG CAPS capsule Take 2 capsules (0.8 mg total) by mouth daily after supper. 12/31/16  Yes Tammi Klippel, Sherin, DO  White Petrolatum-Mineral Oil (ALTALUBE) 85-15 % OINT Place 1 application into both eyes at bedtime. 01/23/18  Yes [provider]  brimonidine (ALPHAGAN) 0.2 % ophthalmic solution PLACE 1 DROP INTO THE LEFT EYE 2 (TWO) TIMES DAILY. Patient not taking: Reported on 12/29/2020 06/15/15   Mariel Aloe, MD    Allergies    Patient has no known allergies.  Review of Systems   Review of Systems  Constitutional:  Negative for activity change, appetite change, chills and fever.  HENT:  Negative for sore throat and trouble swallowing.   Eyes:  Negative for pain and visual disturbance.  Respiratory:  Positive for cough and shortness of breath. Negative for chest tightness.   Cardiovascular:  Negative for chest pain and palpitations.  Gastrointestinal:  Positive for abdominal pain and nausea. Negative for abdominal distention, blood in stool, constipation, diarrhea and vomiting.  Genitourinary:  Negative for decreased urine volume, dysuria, hematuria and urgency.  Musculoskeletal:  Negative for arthralgias and back pain.  Skin:  Negative  for color change, pallor and rash.  Neurological:  Negative for dizziness, seizures and syncope.  Psychiatric/Behavioral:  Negative for confusion. The patient is not nervous/anxious.   All other systems reviewed and are negative.  Physical Exam Updated Vital Signs BP (!) 145/102 (BP Location: Left Arm)   Pulse 63   Temp 98.2 F (36.8 C) (Oral)   Resp 20   Wt 93.3 kg   SpO2 94%   BMI 33.20 kg/m   Physical Exam Vitals and nursing note reviewed.  Constitutional:      General: He is not in acute distress.    Appearance: He is well-developed. He is obese. He is not ill-appearing or toxic-appearing.     Interventions: Nasal cannula in place.     Comments: On 2L O2  HENT:     Head: Normocephalic and atraumatic.     Right Ear: External ear normal.     Left Ear: External ear normal.     Nose: Nose normal.     Mouth/Throat:     Mouth: Mucous membranes are moist.  Eyes:     General: No scleral icterus.    Extraocular Movements: Extraocular movements intact.     Conjunctiva/sclera: Conjunctivae normal.     Pupils: Pupils are equal, round, and reactive to light.  Cardiovascular:     Rate and Rhythm: Normal rate and regular rhythm.     Heart sounds: Normal heart sounds. No murmur heard. Pulmonary:     Effort: Pulmonary effort is normal. No tachypnea, bradypnea or respiratory distress.     Breath sounds: Normal breath sounds and air entry. No wheezing or rales.  Abdominal:     General: Abdomen is protuberant. There is distension.     Palpations: Abdomen is soft. There is no fluid wave, hepatomegaly or splenomegaly.     Tenderness: There is no abdominal tenderness. There is no  right CVA tenderness, left CVA tenderness, guarding or rebound. Negative signs include Murphy's sign, Rovsing's sign and McBurney's sign.     Hernia: No hernia is present.  Musculoskeletal:        General: Normal range of motion.     Cervical back: Normal range of motion and neck supple. No rigidity or  tenderness.     Right lower leg: No edema.     Left lower leg: No edema.  Lymphadenopathy:     Cervical: No cervical adenopathy.  Skin:    General: Skin is warm and dry.     Capillary Refill: Capillary refill takes less than 2 seconds.     Coloration: Skin is not cyanotic or pale.  Neurological:     General: No focal deficit present.     Mental Status: He is alert and oriented to person, place, and time. Mental status is at baseline.  Psychiatric:        Mood and Affect: Mood normal.        Behavior: Behavior normal.    ED Results / Procedures / Treatments   Labs (all labs ordered are listed, but only abnormal results are displayed) Labs Reviewed  COMPREHENSIVE METABOLIC PANEL - Abnormal; Notable for the following components:      Result Value   Glucose, Bld 133 (*)    BUN 30 (*)    Creatinine, Ser 1.65 (*)    Calcium 8.7 (*)    Total Protein 6.3 (*)    Albumin 3.1 (*)    Total Bilirubin 1.4 (*)    GFR, Estimated 46 (*)    All other components within normal limits  CBC - Abnormal; Notable for the following components:   Hemoglobin 12.7 (*)    RDW 15.9 (*)    All other components within normal limits  URINALYSIS, ROUTINE W REFLEX MICROSCOPIC - Abnormal; Notable for the following components:   APPearance HAZY (*)    Hgb urine dipstick SMALL (*)    Protein, ur >=300 (*)    Bacteria, UA RARE (*)    All other components within normal limits  BRAIN NATRIURETIC PEPTIDE - Abnormal; Notable for the following components:   B Natriuretic Peptide 2,475.4 (*)    All other components within normal limits  GLUCOSE, CAPILLARY - Abnormal; Notable for the following components:   Glucose-Capillary 126 (*)    All other components within normal limits  GLUCOSE, CAPILLARY - Abnormal; Notable for the following components:   Glucose-Capillary 113 (*)    All other components within normal limits  TROPONIN I (HIGH SENSITIVITY) - Abnormal; Notable for the following components:   Troponin I  (High Sensitivity) 354 (*)    All other components within normal limits  TROPONIN I (HIGH SENSITIVITY) - Abnormal; Notable for the following components:   Troponin I (High Sensitivity) 485 (*)    All other components within normal limits  TROPONIN I (HIGH SENSITIVITY) - Abnormal; Notable for the following components:   Troponin I (High Sensitivity) 605 (*)    All other components within normal limits  RESP PANEL BY RT-PCR (FLU A&B, COVID) ARPGX2  LIPASE, BLOOD  LACTIC ACID, PLASMA  COMPREHENSIVE METABOLIC PANEL  CBC  TROPONIN I (HIGH SENSITIVITY)    EKG None  Radiology DG Chest 1 View  Result Date: 12/29/2020 CLINICAL DATA:  65 year old male with history of shortness of breath and cough. Abdominal pain. EXAM: CHEST  1 VIEW COMPARISON:  Chest x-ray 12/07/2020. FINDINGS: Lung volumes are low. Bibasilar opacities are favored  to reflect subsegmental atelectasis. No definite consolidative airspace disease. No definite pleural effusions. No pneumothorax. Cephalization of the pulmonary vasculature, without frank pulmonary edema. Heart size is moderately enlarged. Upper mediastinal contours are within normal limits. IMPRESSION: 1. Cardiomegaly with cephalization of the pulmonary vasculature, but no frank pulmonary edema. 2. Low lung volumes with bibasilar subsegmental atelectasis. Electronically Signed   By: Vinnie Langton M.D.   On: 12/29/2020 12:35    Procedures Procedures   Medications Ordered in ED Medications  aspirin EC tablet 81 mg (has no administration in time range)  atorvastatin (LIPITOR) tablet 40 mg (has no administration in time range)  bisoprolol (ZEBETA) tablet 5 mg (has no administration in time range)  hydrALAZINE (APRESOLINE) tablet 50 mg (has no administration in time range)  isosorbide mononitrate (IMDUR) 24 hr tablet 30 mg (has no administration in time range)  sacubitril-valsartan (ENTRESTO) 24-26 mg per tablet (has no administration in time range)  sertraline  (ZOLOFT) tablet 50 mg (has no administration in time range)  senna-docusate (Senokot-S) tablet 1 tablet (has no administration in time range)  polyethylene glycol (MIRALAX / GLYCOLAX) packet 17 g (has no administration in time range)  tamsulosin (FLOMAX) capsule 0.8 mg (has no administration in time range)  divalproex (DEPAKOTE) DR tablet 125 mg (has no administration in time range)  levETIRAcetam (KEPPRA) tablet 750 mg (has no administration in time range)  enoxaparin (LOVENOX) injection 40 mg (has no administration in time range)  insulin aspart (novoLOG) injection 0-9 Units (has no administration in time range)  furosemide (LASIX) injection 40 mg (has no administration in time range)  ipratropium-albuterol (DUONEB) 0.5-2.5 (3) MG/3ML nebulizer solution 3 mL (3 mLs Nebulization Given 12/29/20 1207)  furosemide (LASIX) injection 40 mg (40 mg Intravenous Given 12/29/20 1615)  acetaminophen (TYLENOL) tablet 650 mg (650 mg Oral Given 12/29/20 1614)  alum & mag hydroxide-simeth (MAALOX/MYLANTA) 200-200-20 MG/5ML suspension 30 mL (30 mLs Oral Given 12/29/20 1934)    ED Course  I have reviewed the triage vital signs and the nursing notes.  Pertinent labs & imaging results that were available during my care of the patient were reviewed by me and considered in my medical decision making (see chart for details).    MDM Rules/Calculators/A&P                          [name] is a 66 y.o. male presenting with abdominal pain. Initial VS sig for HTN.  Hypoxic on arrival to 88% requiring 2L does not wear oxygen at baseline.   EKG interpretation: Normal sinus rhythm, prolonged PR, no ST elevations or depressions, T wave inversion in aVL.  Unchanged from prior.  Labs: COVID/flu negative.  Initial troponin of 354, increased from prior.  Repeat troponin 45.  BNP 2475, increased from prior.  Lactic acid and lipase WNL.  CBC WNL.  CMP with creatinine near prior baseline, elevated T bili at 1.4. UA  with small hematuria and proteinuria, rare bacteria and negative nitrite.  Imaging: CXR with cardiomegaly and interstitial pulmonary edema. Imaging was reviewed by radiology and personally by me.  DDX considered: COPD, CAP, ACS, PE, bowel obstruction, diverticulitis, appendicitis, pancreatitis, mesenteric ischemia. History, examination, and objective data most consistent with likely CHF exacerbation, given new oxygen requirement and progressive SOB with uptrending troponin.  Suspect abdominal pain secondary to ascites from fluid overload, nontender to palpation.  Low suspicion for NSTEMI given lack of chest pain, suspect uptrending troponin secondary to worsening CHF.  Low suspicion for abdominal surgical emergency at this time.  Patient continues to have normal bowel movements and pass flatus.  Wells PE criteria 1.5, low risk at this time, although remains possible source of hypoxia.  Medications: Medications  aspirin EC tablet 81 mg (has no administration in time range)  atorvastatin (LIPITOR) tablet 40 mg (has no administration in time range)  bisoprolol (ZEBETA) tablet 5 mg (has no administration in time range)  hydrALAZINE (APRESOLINE) tablet 50 mg (has no administration in time range)  isosorbide mononitrate (IMDUR) 24 hr tablet 30 mg (has no administration in time range)  sacubitril-valsartan (ENTRESTO) 24-26 mg per tablet (has no administration in time range)  sertraline (ZOLOFT) tablet 50 mg (has no administration in time range)  senna-docusate (Senokot-S) tablet 1 tablet (has no administration in time range)  polyethylene glycol (MIRALAX / GLYCOLAX) packet 17 g (has no administration in time range)  tamsulosin (FLOMAX) capsule 0.8 mg (has no administration in time range)  divalproex (DEPAKOTE) DR tablet 125 mg (has no administration in time range)  levETIRAcetam (KEPPRA) tablet 750 mg (has no administration in time range)  enoxaparin (LOVENOX) injection 40 mg (has no administration in  time range)  insulin aspart (novoLOG) injection 0-9 Units (has no administration in time range)  furosemide (LASIX) injection 40 mg (has no administration in time range)  ipratropium-albuterol (DUONEB) 0.5-2.5 (3) MG/3ML nebulizer solution 3 mL (3 mLs Nebulization Given 12/29/20 1207)  furosemide (LASIX) injection 40 mg (40 mg Intravenous Given 12/29/20 1615)  acetaminophen (TYLENOL) tablet 650 mg (650 mg Oral Given 12/29/20 1614)  alum & mag hydroxide-simeth (MAALOX/MYLANTA) 200-200-20 MG/5ML suspension 30 mL (30 mLs Oral Given 12/29/20 1934)     Re-evaluated prior to admission. Hemodynamically stable and in no acute distress.  Attempted to de-escalate to 1 L, patient became hypoxic to high 80s, replaced on 2 L [place].  Admitted to family medicine. Patient understands and agrees with the plan. Continue to trend troponin. Transferred from the ED without issue.    The plan for this patient was discussed with my attending physician, who voiced agreement and who oversaw evaluation and treatment of this patient.     Note: Estate manager/land agent was used in the creation of this note.  Final Clinical Impression(s) / ED Diagnoses Final diagnoses:  Acute on chronic congestive heart failure, unspecified heart failure type [facility])    Rx / [place] Orders ED Discharge Orders     None        Cherly Hensen, DO 12/29/20 2248    Margette Fast, MD 12/31/20 [phone]

## 2020-12-29 NOTE — ED Triage Notes (Signed)
EMS stated, He has had abdominal pain with SOB and cough for 10 days.

## 2020-12-29 NOTE — H&P (Signed)
[facility] Admission History and Physical Service Pager: [phone]  Patient name: [name] Medical record number: [id number] Date of birth: 20-Oct-1955 Age: 65 y.o. Gender: male  Primary Care Provider: [name], MD Consultants: None Code Status: DNR Preferred Emergency Contact: Delbert Phenix (sister) [phone]  Chief Complaint: Abdominal pain, shortness of breath  Assessment and Plan: [name] is a 65 y.o. male presenting with abdominal pain and shortness of breath concerning for CHF exacerbation. PMH is significant for HFrEF, HTN, HLD, T2DM, s/p bilateral BKA, CKD stage III, prior CVA, seizure disorder.  Acute on Chronic HFrEF Patient presents with abdominal pain and shortness of breath over the past ~7 days. He was recently admitted for acute CHF exacerbation two weeks ago at which time echo showed EF 20-25% with global LV hypokinesis and grade III diastolic dysfunction, as well as mod to severe TR. He was discharged on PO Lasix 40mg  daily (after receiving 80mg  IV Lasix BID then daily during last admission).  Patient hypoxic to 88% on room air in the ED, which improved with 2L nasal cannula. Remainder of vitals stable. BNP elevated to 2475 today and troponin elevated to 354. Volume status difficult to assess on exam given b/l BKA and body habitus, however has some JVD and CXR revealed cephalization of pulmonary vasculature. Presentation consistent with acute on chronic CHF. Renal function and weight stable from discharge several days ago. Less likely pneumonia (afebrile, no leukocytosis, no signs of pneumonia on CXR). Could consider PE but very low clinical suspicion at this time. No hx of COPD or other underlying lung disease. -Admit to FPTS, med-tele, attending Dr. Ardelia Mems -s/p Lasix 40mg  IV in the ED -Will give an additional 40mg  IV Lasix tonight -Strict I/Os -Daily weights -Consult heart failure team in the morning -Continue home beta  blocker, imdur, and entresto  Elevated Troponin Troponin 354 > 485 on admission, up from 275 during last admission 12 days ago. Suspect demand ischemia related to CHF exacerbation. Patient denies chest pain. EKG without acute changes. -Trend troponin  Abdominal Pain Patient c/o intermittent RLQ abdominal pain over the past 7 days. Denies pain currently and has no tenderness on exam. States it typically occurs after eating dinner. No fever, leukocytosis. Last BM yesterday per patient report. LFTs, lipase, and UA overall unremarkable. Possibly related to volume overload vs constipation. -Treatment of CHF as above -Miralax 17g BID -Senna daily -Low threshold to obtain abdominal imaging if worsening pain or if febrile  T2DM A1c 6.1% during recent admission 2 weeks ago. Discharged on Januvia 800mg  daily and Metformin 1000mg  BID. Had previously been on Lantus 20u but this was discontinued last admission. Glucose 133 on admission. -sSSI -CBG monitoring qAC and qHS -Holding home Januvia and Metformin  HTN Mildly hypertensive to 140s-150s on admission. -IV Lasix as above -Continue home bisoprolol 5mg  daily -Continue home Hydralazine 50mg  q8h  CKD Stage IIIb Cr 1.65 on admission, which is stable from 10 days ago. Baseline Cr ~1.5. -Daily CMP  Seizure Disorder No recent seizure activity -Continue home Keppra and Depakote  HLD Chronic, stable -Continue home Atorvastatin 40mg  daily  FEN/GI: Heart healthy diet Prophylaxis: Lovenox  Disposition: med-tele  History of Present Illness:  [name] is a 65 y.o. male presenting with abdominal pain and shortness of breath.  Patient reports RLQ abdominal pain intermittently over the past 7-10 days. States it usually occurs after dinner. Also endorses some shortness of breath. Feels his dyspnea is better than when he was  in the hospital recently. However it is bothersome to him today. No fever, chills, cough, chest pain, nausea, vomiting,  or other complaints. He wonders if his abdominal pain is related to constipation although had a normal BM last night. Patient is an overall poor historian.   Review Of Systems: Per HPI with the following additions:   Review of Systems  Constitutional:  Negative for chills and fever.  HENT:  Negative for congestion.   Respiratory:  Positive for shortness of breath. Negative for cough.   Cardiovascular:  Negative for chest pain.  Gastrointestinal:  Positive for abdominal pain and constipation. Negative for diarrhea and vomiting.  Skin:  Negative for rash.  Neurological:  Negative for seizures and syncope.    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   Irregular heart rate    Acute encephalopathy 01/31/2017   Cellulitis of right upper extremity    Anasarca    Altered mental status    Slurred speech    Hypothermia 01/30/2017   Pressure injury of skin 12/21/2016   CHF exacerbation ([place]) 12/20/2016   Peripheral edema    Benign prostatic hyperplasia with lower urinary tract symptoms    Embolic stroke ([place]) 27/05/5007   Cardiomyopathy, 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[place]) 10/19/2016   Acute on chronic systolic heart failure, NYHA class 3 ([place]) 10/19/2016   Moderate to severe pulmonary hypertension ([place]) 10/19/2016   Severe tricuspid regurgitation by prior echocardiogram 10/19/2016   Diabetes mellitus type 2 in obese ([place]) 10/19/2016   HLD (hyperlipidemia) 10/19/2016   Seizure ([place]) 10/19/2016   Fluid overload 10/14/2016   Idiopathic chronic venous hypertension of left lower extremity with ulcer  and inflammation ([place]) 10/09/2016   Cardiorenal syndrome with renal failure 09/28/2016   Acute urinary retention 09/26/2016   Acute on chronic combined systolic and diastolic CHF (congestive heart failure) ([place]) 09/26/2016   Scrotal edema 09/24/2016   Multiple open wounds of lower leg, initial encounter 09/24/2016   Tinea of groin 09/24/2016   Chronic kidney disease (CKD), stage III (moderate) ([place]) 06/07/2016   Chronic combined systolic and diastolic congestive heart failure (HCC)    Protein calorie malnutrition ([place]) 07/12/2015   Congestive dilated cardiomyopathy ([place]) 07/11/2015   Pressure ulcer 07/09/2015   Open knee wound 07/06/2015   History of right below knee amputation ([place]) 02/22/2015   PAD (peripheral artery disease) ([place]) 02/22/2015   Blindness of left eye 38/18/2993   Complications, amputation stump late ([place]) 08/06/2014   Diabetic neuropathy associated with type 2 diabetes mellitus ([place]) 12/01/2013   Noncompliance with medications 07/13/2010   OTHER SPEC TYPES SCHIZOPHRENIA UNSPEC CONDITION 05/24/2009   Obesity, unspecified 06/09/2007   DM (diabetes mellitus), type 2, uncontrolled 05/02/2006   Essential hypertension 05/02/2006    Past Medical History: Past Medical History:  Diagnosis Date   Acute on chronic systolic heart failure, NYHA class 3 ([place])    AKI (acute kidney injury) ([place])    Anemia    BKA stump complication ([place])    Blind left eye    Blind left eye    Cardiomyopathy, dilated (  [place])    CHF (congestive heart failure) (HCC)    Combined systolic and diastolic congestive heart failure (HCC)    Congestive dilated cardiomyopathy ([place]) 07/11/2015   Diabetes mellitus    Diabetic neuropathy associated with type 2 diabetes mellitus ([place])    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   Left body jerking   Hypertension    Neuropathy    Noncompliance with medications    Open knee wound 10/2015   on rt bka   PAD (peripheral artery disease) (HCC)    Protein  calorie malnutrition (HCC)    Shortness of breath dyspnea     Past Surgical History: Past Surgical History:  Procedure Laterality Date   ABDOMINAL AORTOGRAM W/LOWER EXTREMITY N/A 03/25/2017   Procedure: ABDOMINAL AORTOGRAM W/LOWER EXTREMITY;  Surgeon: Angelia Mould, MD;  Location: [facility];  Service: Cardiovascular;  Laterality: N/A;   AMPUTATION Right 09/26/2013   Procedure: AMPUTATION BELOW KNEE;  Surgeon: Rozanna Box, MD;  Location: [facility];  Service: Orthopedics;  Laterality: Right;   AMPUTATION Right 05/01/2015   Procedure: REVISION OF RIGHT TRANSTIBIAL AMPUTATION ;  Surgeon: Newt Minion, MD;  Location: [facility];  Service: Orthopedics;  Laterality: Right;   AMPUTATION Left 06/24/2020   Procedure: LEFT BELOW KNEE AMPUTATION;  Surgeon: Newt Minion, MD;  Location: [facility];  Service: Orthopedics;  Laterality: Left;   APPLICATION OF WOUND VAC  08/10/2016   Procedure: APPLICATION OFI NCISONAL  WOUND VAC;  Surgeon: Newt Minion, MD;  Location: [facility];  Service: Orthopedics;;   CARDIAC CATHETERIZATION N/A 07/13/2015   Procedure: Right/Left Heart Cath and Coronary Angiography;  Surgeon: Jolaine Artist, MD;  Location: [facility];  Service: Cardiovascular;  Laterality: N/A;   COLONOSCOPY     I & D EXTREMITY Right 09/24/2013   Procedure: IRRIGATION AND DEBRIDEMENT RIGHT FOOT ULCER;  Surgeon: Renette Butters, MD;  Location: [facility];  Service: Orthopedics;  Laterality: Right;   I & D EXTREMITY Right 09/26/2013   Procedure: Repeat IRRIGATION AND DEBRIDEMENT Right Foot Ulcer;  Surgeon: Rozanna Box, MD;  Location: [facility];  Service: Orthopedics;  Laterality: Right;   MULTIPLE TOOTH EXTRACTIONS     SKIN SPLIT GRAFT Right 08/10/2016   Procedure: SKIN GRAFT SPLIT THICKNESS RIGHT LEG;  Surgeon: Newt Minion, MD;  Location: [facility];  Service: Orthopedics;  Laterality: Right;   STUMP REVISION Right 04/20/2015   Procedure: Revision Right Below Knee Amputation, Apply Wound VAC;   Surgeon: Newt Minion, MD;  Location: [facility];  Service: Orthopedics;  Laterality: Right;   TONSILLECTOMY      Social History: Social History   Tobacco Use   Smoking status: Former    Years: 20.00    Types: Cigarettes    Quit date: 05/10/2000    Years since quitting: 20.6   Smokeless tobacco: Former    Quit date: 02/22/2000  Vaping Use   Vaping Use: Never used  Substance Use Topics   Alcohol use: No    Alcohol/week: 0.0 standard drinks   Drug use: No    Family History: Family History  Problem Relation Age of Onset   Cancer Mother    Peripheral vascular disease Father     Allergies and Medications: No Known Allergies No current facility-administered medications on file prior to encounter.   Current Outpatient Medications on File Prior to Encounter  Medication Sig Dispense Refill   acetaminophen (TYLENOL) 325 MG tablet Take 650 mg by mouth  every 6 (six) hours as needed for mild pain (AND CANNOT EXCEED 3,000 MG/24 HOURS OF TYLENOL FROM ALL COMBINED SOURCES).     Alogliptin Benzoate 25 MG TABS Take 25 mg by mouth daily.     aspirin 81 MG EC tablet Take 81 mg by mouth daily.     atorvastatin (LIPITOR) 40 MG tablet Take 40 mg by mouth at bedtime.      bisacodyl (DULCOLAX) 5 MG EC tablet Take 10 mg by mouth daily as needed for moderate constipation.     bisoprolol (ZEBETA) 5 MG tablet Take 1 tablet (5 mg total) by mouth daily. 30 tablet 1   divalproex (DEPAKOTE) 125 MG DR tablet Take 125 mg by mouth in the morning and at bedtime.     dorzolamide-timolol (COSOPT) 22.3-6.8 MG/ML ophthalmic solution Place 1 drop into the left eye 2 (two) times daily. 10 mL 12   furosemide (LASIX) 40 MG tablet Take 1 tablet (40 mg total) by mouth daily. 30 tablet    GLUCAGEN HYPOKIT 1 MG SOLR injection Inject 1 mg into the muscle once as needed for low blood sugar.     hydrALAZINE (APRESOLINE) 50 MG tablet Take 1 tablet (50 mg total) by mouth every 8 (eight) hours. (Patient taking differently: Take 75  mg by mouth every 8 (eight) hours.)     isosorbide mononitrate (IMDUR) 30 MG 24 hr tablet Take 1 tablet (30 mg total) by mouth daily.     levETIRAcetam (KEPPRA) 750 MG tablet Take 1 tablet (750 mg total) by mouth 2 (two) times daily. 60 tablet 0   metFORMIN (GLUCOPHAGE) 1000 MG tablet Take 1,000 mg by mouth 2 (two) times daily with a meal.     pantoprazole (PROTONIX) 20 MG tablet Take 20 mg by mouth daily before breakfast.     Polyethyl Glycol-Propyl Glycol (SYSTANE) 0.4-0.3 % GEL ophthalmic gel Place 1 application into both eyes every 8 (eight) hours.     polyethylene glycol (MIRALAX / GLYCOLAX) packet Take 17 g by mouth 2 (two) times daily. 14 each 0   RHOPRESSA 0.02 % SOLN Place 1 drop into the left eye at bedtime.     sacubitril-valsartan (ENTRESTO) 24-26 MG Take 1 tablet by mouth 2 (two) times daily. 60 tablet 3   senna-docusate (SENOKOT-S) 8.6-50 MG tablet Take 1 tablet by mouth at bedtime.     sertraline (ZOLOFT) 50 MG tablet Take 50 mg by mouth in the morning.     tamsulosin (FLOMAX) 0.4 MG CAPS capsule Take 2 capsules (0.8 mg total) by mouth daily after supper. 60 capsule 0   White Petrolatum-Mineral Oil (ALTALUBE) 85-15 % OINT Place 1 application into both eyes at bedtime.     brimonidine (ALPHAGAN) 0.2 % ophthalmic solution PLACE 1 DROP INTO THE LEFT EYE 2 (TWO) TIMES DAILY. (Patient not taking: Reported on 12/29/2020) 5 mL 0    Objective: BP (!) 158/110   Pulse 80   Temp 99.1 F (37.3 C) (Oral)   Resp 19   SpO2 100%  Exam: General: chronically ill appearing, no acute distress ENTM: moist mucous membranes Neck: supple, mild JVD appreciated Cardiovascular: RRR, normal S1/S2 without m/r/g Respiratory: slightly tachypneic, speaks in short sentences but no obvious respiratory distress, prolonged expiratory phase, bibasilar crackles Gastrointestinal: +BS, soft, nontender MSK: s/p bilateral BKA Neuro: oriented x3, no obvious focal deficits  Labs and Imaging: CBC BMET  Recent  Labs  Lab 12/29/20 1155  WBC 10.0  HGB 12.7*  HCT 40.8  PLT 365  Recent Labs  Lab 12/29/20 1155  NA 137  K 4.1  CL 104  CO2 24  BUN 30*  CREATININE 1.65*  GLUCOSE 133*  CALCIUM 8.7*     EKG: My own interpretation (not copied from electronic read): normal sinus rhythm, LAD  DG Chest 1 View  Result Date: 12/29/2020 CLINICAL DATA:  65 year old male with history of shortness of breath and cough. Abdominal pain. EXAM: CHEST  1 VIEW COMPARISON:  Chest x-ray 12/07/2020. FINDINGS: Lung volumes are low. Bibasilar opacities are favored to reflect subsegmental atelectasis. No definite consolidative airspace disease. No definite pleural effusions. No pneumothorax. Cephalization of the pulmonary vasculature, without frank pulmonary edema. Heart size is moderately enlarged. Upper mediastinal contours are within normal limits. IMPRESSION: 1. Cardiomegaly with cephalization of the pulmonary vasculature, but no frank pulmonary edema. 2. Low lung volumes with bibasilar subsegmental atelectasis. Electronically Signed   By: Vinnie Langton M.D.   On: 12/29/2020 12:35     Alcus Dad, MD 12/29/2020, 5:56 PM PGY-2, [place] [facility] Intern pager: [phone], text pages welcome

## 2020-12-29 NOTE — Progress Notes (Signed)
FPTS Interim Progress Note  S: patient found resting comfortably, reports no SoB and denies CP.    O: BP (!) 145/102 (BP Location: Left Arm)   Pulse 63   Temp 98.2 F (36.8 C) (Oral)   Resp 20   Wt 205 lb 11 oz (93.3 kg)   SpO2 94%   BMI 33.20 kg/m   Physical exam General: middle age male lying in bed, NAD CV: RRR, NMRG Pulm: unlabored respirations  A/P: 65 year old male with dyspnea and ABD pain, likely HFrEF exacerbation. Work up notable for uptrending trops, 354->485->605. New O2 requirement of 1L, diuresed with Lasix 40 mg IV Lasix x2 at 1615 and 2316 today. Home regimen of Lasix 40 mg daily. No UOP documented. Cardiology consulted for trops and concern for type 2 NSTEMI with HFrEF exacerbation, yet to make formal recs.    Corky Sox, MD PGY-1 [facility] Intern pager: [phone], text pages welcome

## 2020-12-29 NOTE — Consult Note (Signed)
Cardiology Consultation:   Patient ID: [name] MRN: [id number]; DOB: 10/18/1955  Admit date: 12/29/2020 Date of Consult: 12/29/2020  Primary Care Provider: [name], MD Primary Cardiologist: Werner Lean, MD  Primary Electrophysiologist:  None    Patient Profile:   [name] is a 65 y.o. male with a hx of HFrEF, HTN, HLD, T2DM, PAD s/p bilateral BKA, CKD stage III, prior CVA, seizure disorder who is being seen today for the evaluation of elevated troponin at the request of family medicine.  History of Present Illness:   [name] is a 65 yo male with HFrEF, HTN, HLD, T2DM, PAD s/p bilateral BKA, CKD stage III, prior CVA, seizure disorder who presented with progressive abdominal pain, nausea, shortness of breath and new O2 requirement. Notes that he has been feeling poorly for several days, but no chest pain. Just lower abdominal pain. Since he has been feeling poorly over the last several weeks, he has been intermittently compliant with medications.  On arrival, he had some hypoxia req 2LNC, elevated (but relatively similar to prior) BNP, and elevated and rising chest pain. Admitted to family medicine for further management with cardiology consultation given elevated troponin.   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[place]) 05/06/2017   Left body jerking   Hypertension    Neuropathy    Noncompliance with medications    Open knee wound 10/2015   on rt bka   PAD  (peripheral artery disease) (HCC)    Protein calorie malnutrition ([place])    Shortness of breath dyspnea     Past Surgical History:  Procedure Laterality Date   ABDOMINAL AORTOGRAM W/LOWER EXTREMITY N/A 03/25/2017   Procedure: ABDOMINAL AORTOGRAM W/LOWER EXTREMITY;  Surgeon: Angelia Mould, MD;  Location: [facility];  Service: Cardiovascular;  Laterality: N/A;   AMPUTATION Right 09/26/2013   Procedure: AMPUTATION BELOW KNEE;  Surgeon: Rozanna Box, MD;  Location: [facility];  Service: Orthopedics;  Laterality: Right;   AMPUTATION Right 05/01/2015   Procedure: REVISION OF RIGHT TRANSTIBIAL AMPUTATION ;  Surgeon: Newt Minion, MD;  Location: [facility];  Service: Orthopedics;  Laterality: Right;   AMPUTATION Left 06/24/2020   Procedure: LEFT BELOW KNEE AMPUTATION;  Surgeon: Newt Minion, MD;  Location: [facility];  Service: Orthopedics;  Laterality: Left;   APPLICATION OF WOUND VAC  08/10/2016   Procedure: APPLICATION OFI NCISONAL  WOUND VAC;  Surgeon: Newt Minion, MD;  Location: [place];  Service: Orthopedics;;   CARDIAC CATHETERIZATION N/A 07/13/2015   Procedure: Right/Left Heart Cath and Coronary Angiography;  Surgeon: Jolaine Artist, MD;  Location: [facility];  Service: Cardiovascular;  Laterality: N/A;   COLONOSCOPY     I & D EXTREMITY Right 09/24/2013   Procedure: IRRIGATION AND DEBRIDEMENT RIGHT FOOT ULCER;  Surgeon: Renette Butters, MD;  Location: [place];  Service: Orthopedics;  Laterality: Right;   I &  D EXTREMITY Right 09/26/2013   Procedure: Repeat IRRIGATION AND DEBRIDEMENT Right Foot Ulcer;  Surgeon: Rozanna Box, MD;  Location: [facility];  Service: Orthopedics;  Laterality: Right;   MULTIPLE TOOTH EXTRACTIONS     SKIN SPLIT GRAFT Right 08/10/2016   Procedure: SKIN GRAFT SPLIT THICKNESS RIGHT LEG;  Surgeon: Newt Minion, MD;  Location: [facility];  Service: Orthopedics;  Laterality: Right;   STUMP REVISION Right 04/20/2015   Procedure: Revision Right Below Knee Amputation,  Apply Wound VAC;  Surgeon: Newt Minion, MD;  Location: [facility];  Service: Orthopedics;  Laterality: Right;   TONSILLECTOMY       Home Medications:  Prior to Admission medications   Medication Sig Start Date End Date Taking? Authorizing Provider  acetaminophen (TYLENOL) 325 MG tablet Take 650 mg by mouth every 6 (six) hours as needed for mild pain (AND CANNOT EXCEED 3,000 MG/24 HOURS OF TYLENOL FROM ALL COMBINED SOURCES).   Yes [provider]  Alogliptin Benzoate 25 MG TABS Take 25 mg by mouth daily.   Yes [provider]  aspirin 81 MG EC tablet Take 81 mg by mouth daily.   Yes [provider]  atorvastatin (LIPITOR) 40 MG tablet Take 40 mg by mouth at bedtime.  08/25/15  Yes [provider]  bisacodyl (DULCOLAX) 5 MG EC tablet Take 10 mg by mouth daily as needed for moderate constipation.   Yes [provider]  bisoprolol (ZEBETA) 5 MG tablet Take 1 tablet (5 mg total) by mouth daily. 10/19/16  Yes Hosie Poisson, MD  divalproex (DEPAKOTE) 125 MG DR tablet Take 125 mg by mouth in the morning and at bedtime.   Yes [provider]  dorzolamide-timolol (COSOPT) 22.3-6.8 MG/ML ophthalmic solution Place 1 drop into the left eye 2 (two) times daily. 09/28/13  Yes Bacigalupo, Dionne Bucy, MD  furosemide (LASIX) 40 MG tablet Take 1 tablet (40 mg total) by mouth daily. 12/21/20  Yes Alcus Dad, MD  GLUCAGEN HYPOKIT 1 MG SOLR injection Inject 1 mg into the muscle once as needed for low blood sugar.   Yes [provider]  hydrALAZINE (APRESOLINE) 50 MG tablet Take 1 tablet (50 mg total) by mouth every 8 (eight) hours. Patient taking differently: Take 75 mg by mouth every 8 (eight) hours. 12/20/20  Yes Alcus Dad, MD  isosorbide mononitrate (IMDUR) 30 MG 24 hr tablet Take 1 tablet (30 mg total) by mouth daily. 12/21/20  Yes Alcus Dad, MD  levETIRAcetam (KEPPRA) 750 MG tablet Take 1 tablet (750 mg total) by mouth 2 (two) times daily.  10/20/16  Yes Sheikh, Omair Latif, DO  metFORMIN (GLUCOPHAGE) 1000 MG tablet Take 1,000 mg by mouth 2 (two) times daily with a meal.   Yes [provider]  pantoprazole (PROTONIX) 20 MG tablet Take 20 mg by mouth daily before breakfast.   Yes [provider]  Polyethyl Glycol-Propyl Glycol (SYSTANE) 0.4-0.3 % GEL ophthalmic gel Place 1 application into both eyes every 8 (eight) hours.   Yes [provider]  polyethylene glycol (MIRALAX / GLYCOLAX) packet Take 17 g by mouth 2 (two) times daily. 02/07/17  Yes Abraham, Sherin, DO  RHOPRESSA 0.02 % SOLN Place 1 drop into the left eye at bedtime.   Yes [provider]  sacubitril-valsartan (ENTRESTO) 24-26 MG Take 1 tablet by mouth 2 (two) times daily. 12/27/20  Yes Simmons, Brittainy M, PA-C  senna-docusate (SENOKOT-S) 8.6-50 MG tablet Take 1 tablet by mouth at bedtime.   Yes  [provider]  sertraline (ZOLOFT) 50 MG tablet Take 50 mg by mouth in the morning.   Yes [provider]  tamsulosin (FLOMAX) 0.4 MG CAPS capsule Take 2 capsules (0.8 mg total) by mouth daily after supper. 12/31/16  Yes Tammi Klippel, Sherin, DO  White Petrolatum-Mineral Oil (ALTALUBE) 85-15 % OINT Place 1 application into both eyes at bedtime. 01/23/18  Yes [provider]  brimonidine (ALPHAGAN) 0.2 % ophthalmic solution PLACE 1 DROP INTO THE LEFT EYE 2 (TWO) TIMES DAILY. Patient not taking: Reported on 12/29/2020 06/15/15   Mariel Aloe, MD    Inpatient Medications: Scheduled Meds:  aspirin EC  81 mg Oral Daily   atorvastatin  40 mg Oral QHS   bisoprolol  5 mg Oral Daily   divalproex  125 mg Oral Q12H   [START ON 12/30/2020] enoxaparin (LOVENOX) injection  40 mg Subcutaneous Q24H   [START ON 12/30/2020] hydrALAZINE  50 mg Oral Q8H   [START ON 12/30/2020] insulin aspart  0-9 Units Subcutaneous TID WC   [START ON 12/30/2020] isosorbide mononitrate  30 mg Oral Daily   levETIRAcetam  750 mg Oral BID   polyethylene  glycol  17 g Oral BID   [START ON 12/30/2020] sacubitril-valsartan  1 tablet Oral BID   senna-docusate  1 tablet Oral QHS   [START ON 12/30/2020] sertraline  50 mg Oral q AM   tamsulosin  0.8 mg Oral QPC supper   Continuous Infusions:  PRN Meds:   Allergies:   No Known Allergies  Social History:   Social History   Socioeconomic History   Marital status: Married    Spouse name: Not on file   Number of children: 0   Years of education: 12   Highest education level: Not on file  Occupational History   Not on file  Tobacco Use   Smoking status: Former    Years: 20.00    Types: Cigarettes    Quit date: 05/10/2000    Years since quitting: 20.6   Smokeless tobacco: Former    Quit date: 02/22/2000  Vaping Use   Vaping Use: Never used  Substance and Sexual Activity   Alcohol use: No    Alcohol/week: 0.0 standard drinks   Drug use: No   Sexual activity: Not on file  Other Topics Concern   Not on file  Social History Narrative   ** Merged History Encounter **       Lives alone.    Caffeine use: Tea/soda daily   Coffee sometimes    Left handed    Social Determinants of Health   Financial Resource Strain: Not on file  Food Insecurity: Not on file  Transportation Needs: Not on file  Physical Activity: Not on file  Stress: Not on file  Social Connections: Not on file  Intimate Partner Violence: Not on file    Family History:    Family History  Problem Relation Age of Onset   Cancer Mother    Peripheral vascular disease Father      Review of Systems: [y] = yes, [ ]  = no    General: Weight gain [ ] ; Weight loss [ ] ; Anorexia [ ] ; Fatigue [ ] ; Fever [ ] ; Chills [ ] ; Weakness [ ]   Cardiac: Chest pain/pressure [ ] ; Resting SOB [ y]; Exertional SOB [ ] ; Orthopnea [ ] ; Pedal Edema [ ] ; Palpitations [ ] ; Syncope [ ] ; Presyncope [ ] ; Paroxysmal nocturnal dyspnea[ ]   Pulmonary: Cough [ ] ; Wheezing[ ] ; Hemoptysis[ ] ;  Sputum [ ] ; Snoring [ ]   GI: Vomiting[ ] ; Dysphagia[ ] ;  Melena[ ] ; Hematochezia [ ] ; Heartburn[ ] ; Abdominal pain [ y]; Constipation [ ] ; Diarrhea [ ] ; BRBPR [ ]   GU: Hematuria[ ] ; Dysuria [ ] ; Nocturia[ ]   Vascular: Pain in legs with walking [ ] ; Pain in feet with lying flat [ ] ; Non-healing sores [ ] ; Stroke [ ] ; TIA [ ] ; Slurred speech [ ] ;  Neuro: Headaches[ ] ; Vertigo[ ] ; Seizures[ ] ; Paresthesias[ ] ;Blurred vision [ ] ; Diplopia [ ] ; Vision changes [ ]   Ortho/Skin: Arthritis [ ] ; Joint pain [ ] ; Muscle pain [ ] ; Joint swelling [ ] ; Back Pain [ ] ; Rash [ ]   Psych: Depression[ ] ; Anxiety[ ]   Heme: Bleeding problems [ ] ; Clotting disorders [ ] ; Anemia [ ]   Endocrine: Diabetes [ ] ; Thyroid dysfunction[ ]   Physical Exam/Data:   Vitals:   12/29/20 2000 12/29/20 2030 12/29/20 2054 12/29/20 2055  BP: (!) 151/106 (!) 158/106 (!) 145/102   Pulse: 68 66 63   Resp: 18 17 20    Temp:  98.1 F (36.7 C) 98.2 F (36.8 C)   TempSrc:  Oral Oral   SpO2: 99% 100% 94%   Weight:    93.3 kg    Intake/Output Summary (Last 24 hours) at 12/29/2020 2356 Last data filed at 12/29/2020 2342 Gross per 24 hour  Intake 240 ml  Output 250 ml  Net -10 ml   Filed Weights   12/29/20 2055  Weight: 93.3 kg   Body mass index is 33.2 kg/m.  General:  lying in bed on oxygen, intermittently sleeping  HEENT: normal Lymph: no adenopathy Neck: JVP elevated to manible +HJR Endocrine:  No thryomegaly Vascular: No carotid bruits; FA pulses 2+ bilaterally without bruits  Cardiac:  normal S1, S2; RRR; harsh systolic murmur Lungs:  diminished breath sounds  Abd: soft, mild TTP lower quadrant Ext: bka  Musculoskeletal:  No deformities, BUE and BLE strength normal and equal Skin: warm and dry  Neuro:  CNs 2-12 intact, no focal abnormalities noted Psych:  Normal affect   EKG:  The EKG was personally reviewed and demonstrates:  no change from prior with BBB Telemetry:  Telemetry was personally reviewed and demonstrates:  sinus   Relevant CV Studies: Echo  12/18/2020 IMPRESSIONS     1. Left ventricular ejection fraction, by estimation, is 20 to 25%. The  left ventricle has severely decreased function. The left ventricle  demonstrates global hypokinesis. There is mild concentric left ventricular  hypertrophy. Left ventricular diastolic   parameters are consistent with Grade III diastolic dysfunction  (restrictive). Elevated left ventricular end-diastolic pressure. The E/e'  is 29.6.   2. Right ventricular systolic function is moderately reduced. The right  ventricular size is severely enlarged. There is moderately elevated  pulmonary artery systolic pressure. The estimated right ventricular  systolic pressure is 63.1 mmHg.   3. Left atrial size was mildly dilated.   4. Right atrial size was mild to moderately dilated.   5. The mitral valve is grossly normal. Mild mitral valve regurgitation.  No evidence of mitral stenosis.   6. Tricuspid valve regurgitation is moderate to severe.   7. The aortic valve is tricuspid. Aortic valve regurgitation is not  visualized. No aortic stenosis is present.   8. The inferior vena cava is dilated in size with >50% respiratory  variability, suggesting right atrial pressure of 8 mmHg.  Laboratory Data:  Chemistry Recent Labs  Lab 12/27/20 1009 12/29/20 1155  NA 139  137  K 4.2 4.1  CL 105 104  CO2 26 24  GLUCOSE 154* 133*  BUN 30* 30*  CREATININE 1.56* 1.65*  CALCIUM 8.5* 8.7*  GFRNONAA 49* 46*  ANIONGAP 8 9    Recent Labs  Lab 12/29/20 1155  PROT 6.3*  ALBUMIN 3.1*  AST 34  ALT 42  ALKPHOS 90  BILITOT 1.4*   Hematology Recent Labs  Lab 12/29/20 1155  WBC 10.0  RBC 4.84  HGB 12.7*  HCT 40.8  MCV 84.3  MCH 26.2  MCHC 31.1  RDW 15.9*  PLT 365   Cardiac EnzymesNo results for input(s): TROPONINI in the last 168 hours. No results for input(s): TROPIPOC in the last 168 hours.  BNP Recent Labs  Lab 12/29/20 1207  BNP 2,475.4*    DDimer No results for input(s): DDIMER in  the last 168 hours.  Radiology/Studies:  DG Chest 1 View  Result Date: 12/29/2020 CLINICAL DATA:  65 year old male with history of shortness of breath and cough. Abdominal pain. EXAM: CHEST  1 VIEW COMPARISON:  Chest x-ray 12/07/2020. FINDINGS: Lung volumes are low. Bibasilar opacities are favored to reflect subsegmental atelectasis. No definite consolidative airspace disease. No definite pleural effusions. No pneumothorax. Cephalization of the pulmonary vasculature, without frank pulmonary edema. Heart size is moderately enlarged. Upper mediastinal contours are within normal limits. IMPRESSION: 1. Cardiomegaly with cephalization of the pulmonary vasculature, but no frank pulmonary edema. 2. Low lung volumes with bibasilar subsegmental atelectasis. Electronically Signed   By: Vinnie Langton M.D.   On: 12/29/2020 12:35    Assessment and Plan:   NSTEMI. Difficult to assess whether this is a type 1 NSTEMI vs demand ischemia in the setting of heart failure exacerbation. He certainly is volume overloaded with pulm edema and elevated JVP, however the ongoing rising troponin make a little more concerned and feel like we should take true ACS off the table with coronary angiography at some point. Fortunately, he is chest pain free and has not had any chest pain. However, with recent worsening of his cardiomyopathy and now ongoing issues with diuresis would consider right and left heart catheterization this hospitalization. I worry that his abdominal pain, n/v is from worsening HF. The complicating factor is his medication nonadherence is likely driving his exacerbation as well. Still worthwhile to consider [facility] for diagnostic purpurses. If cors were still nonobstructive, could consider PE given elevated BNP, trop, and oxygen requirement, though this seems unlikely.   Recommendations: - reasonable to start heparin now  - please make NPO for consideration of right and left heart cath. Day cards team will see  and decide  - agree with ongoing diursesis - his filling pressures are high on my exam  - continue to emphasize important of medication adherence       For questions or updates, please contact [facility] Please consult [email or web address] for contact info under     Signed, Doyne Keel, MD  12/29/2020 11:56 PM

## 2020-12-30 DIAGNOSIS — Z6833 Body mass index (BMI) 33.0-33.9, adult: Secondary | ICD-10-CM | POA: Diagnosis not present

## 2020-12-30 DIAGNOSIS — R600 Localized edema: Secondary | ICD-10-CM | POA: Diagnosis present

## 2020-12-30 DIAGNOSIS — G40909 Epilepsy, unspecified, not intractable, without status epilepticus: Secondary | ICD-10-CM | POA: Diagnosis present

## 2020-12-30 DIAGNOSIS — R1031 Right lower quadrant pain: Secondary | ICD-10-CM | POA: Diagnosis present

## 2020-12-30 DIAGNOSIS — I071 Rheumatic tricuspid insufficiency: Secondary | ICD-10-CM | POA: Diagnosis present

## 2020-12-30 DIAGNOSIS — I5043 Acute on chronic combined systolic (congestive) and diastolic (congestive) heart failure: Secondary | ICD-10-CM | POA: Diagnosis present

## 2020-12-30 DIAGNOSIS — N1832 Chronic kidney disease, stage 3b: Secondary | ICD-10-CM | POA: Diagnosis present

## 2020-12-30 DIAGNOSIS — E1159 Type 2 diabetes mellitus with other circulatory complications: Secondary | ICD-10-CM

## 2020-12-30 DIAGNOSIS — H5462 Unqualified visual loss, left eye, normal vision right eye: Secondary | ICD-10-CM | POA: Diagnosis present

## 2020-12-30 DIAGNOSIS — I152 Hypertension secondary to endocrine disorders: Secondary | ICD-10-CM

## 2020-12-30 DIAGNOSIS — I420 Dilated cardiomyopathy: Secondary | ICD-10-CM | POA: Diagnosis present

## 2020-12-30 DIAGNOSIS — I5023 Acute on chronic systolic (congestive) heart failure: Secondary | ICD-10-CM | POA: Diagnosis not present

## 2020-12-30 DIAGNOSIS — I959 Hypotension, unspecified: Secondary | ICD-10-CM | POA: Diagnosis not present

## 2020-12-30 DIAGNOSIS — Z89511 Acquired absence of right leg below knee: Secondary | ICD-10-CM | POA: Diagnosis not present

## 2020-12-30 DIAGNOSIS — E1140 Type 2 diabetes mellitus with diabetic neuropathy, unspecified: Secondary | ICD-10-CM | POA: Diagnosis present

## 2020-12-30 DIAGNOSIS — E785 Hyperlipidemia, unspecified: Secondary | ICD-10-CM | POA: Diagnosis present

## 2020-12-30 DIAGNOSIS — L89892 Pressure ulcer of other site, stage 2: Secondary | ICD-10-CM | POA: Diagnosis present

## 2020-12-30 DIAGNOSIS — H409 Unspecified glaucoma: Secondary | ICD-10-CM | POA: Diagnosis present

## 2020-12-30 DIAGNOSIS — R0902 Hypoxemia: Secondary | ICD-10-CM | POA: Diagnosis present

## 2020-12-30 DIAGNOSIS — I130 Hypertensive heart and chronic kidney disease with heart failure and stage 1 through stage 4 chronic kidney disease, or unspecified chronic kidney disease: Secondary | ICD-10-CM | POA: Diagnosis present

## 2020-12-30 DIAGNOSIS — Z89512 Acquired absence of left leg below knee: Secondary | ICD-10-CM | POA: Diagnosis not present

## 2020-12-30 DIAGNOSIS — Z66 Do not resuscitate: Secondary | ICD-10-CM | POA: Diagnosis present

## 2020-12-30 DIAGNOSIS — N183 Chronic kidney disease, stage 3 unspecified: Secondary | ICD-10-CM | POA: Diagnosis not present

## 2020-12-30 DIAGNOSIS — N400 Enlarged prostate without lower urinary tract symptoms: Secondary | ICD-10-CM | POA: Diagnosis present

## 2020-12-30 DIAGNOSIS — Z20822 Contact with and (suspected) exposure to covid-19: Secondary | ICD-10-CM | POA: Diagnosis present

## 2020-12-30 DIAGNOSIS — E669 Obesity, unspecified: Secondary | ICD-10-CM | POA: Diagnosis present

## 2020-12-30 DIAGNOSIS — E1122 Type 2 diabetes mellitus with diabetic chronic kidney disease: Secondary | ICD-10-CM | POA: Diagnosis present

## 2020-12-30 DIAGNOSIS — N179 Acute kidney failure, unspecified: Secondary | ICD-10-CM | POA: Diagnosis present

## 2020-12-30 DIAGNOSIS — I509 Heart failure, unspecified: Secondary | ICD-10-CM | POA: Diagnosis present

## 2020-12-30 LAB — CBC
HCT: 37.9 % — ABNORMAL LOW (ref 39.0–52.0)
Hemoglobin: 11.6 g/dL — ABNORMAL LOW (ref 13.0–17.0)
MCH: 25.8 pg — ABNORMAL LOW (ref 26.0–34.0)
MCHC: 30.6 g/dL (ref 30.0–36.0)
MCV: 84.4 fL (ref 80.0–100.0)
Platelets: 341 10*3/uL (ref 150–400)
RBC: 4.49 MIL/uL (ref 4.22–5.81)
RDW: 16.1 % — ABNORMAL HIGH (ref 11.5–15.5)
WBC: 9.6 10*3/uL (ref 4.0–10.5)
nRBC: 0 % (ref 0.0–0.2)

## 2020-12-30 LAB — HEPARIN LEVEL (UNFRACTIONATED)
Heparin Unfractionated: <0.1 IU/mL — ABNORMAL LOW (ref 0.30–0.70)
Heparin Unfractionated: <0.1 IU/mL — ABNORMAL LOW (ref 0.30–0.70)

## 2020-12-30 LAB — COMPREHENSIVE METABOLIC PANEL
ALT: 33 U/L (ref 0–44)
AST: 27 U/L (ref 15–41)
Albumin: 2.7 g/dL — ABNORMAL LOW (ref 3.5–5.0)
Alkaline Phosphatase: 80 U/L (ref 38–126)
Anion gap: 7 (ref 5–15)
BUN: 27 mg/dL — ABNORMAL HIGH (ref 8–23)
CO2: 28 mmol/L (ref 22–32)
Calcium: 8.6 mg/dL — ABNORMAL LOW (ref 8.9–10.3)
Chloride: 106 mmol/L (ref 98–111)
Creatinine, Ser: 1.64 mg/dL — ABNORMAL HIGH (ref 0.61–1.24)
GFR, Estimated: 46 mL/min — ABNORMAL LOW (ref >60)
Glucose, Bld: 153 mg/dL — ABNORMAL HIGH (ref 70–99)
Potassium: 4.1 mmol/L (ref 3.5–5.1)
Sodium: 141 mmol/L (ref 135–145)
Total Bilirubin: 1.3 mg/dL — ABNORMAL HIGH (ref 0.3–1.2)
Total Protein: 5.4 g/dL — ABNORMAL LOW (ref 6.5–8.1)

## 2020-12-30 LAB — GLUCOSE, CAPILLARY
Glucose-Capillary: 125 mg/dL — ABNORMAL HIGH (ref 70–99)
Glucose-Capillary: 102 mg/dL — ABNORMAL HIGH (ref 70–99)
Glucose-Capillary: 136 mg/dL — ABNORMAL HIGH (ref 70–99)
Glucose-Capillary: 89 mg/dL (ref 70–99)

## 2020-12-30 LAB — TROPONIN I (HIGH SENSITIVITY)
Troponin I (High Sensitivity): 639 ng/L (ref <18)
Troponin I (High Sensitivity): 738 ng/L (ref <18)
Troponin I (High Sensitivity): 1183 ng/L (ref <18)
Troponin I (High Sensitivity): 1208 ng/L (ref <18)
Troponin I (High Sensitivity): 1126 ng/L (ref <18)

## 2020-12-30 MED ORDER — FUROSEMIDE 10 MG/ML IJ SOLN
80.0000 mg | Freq: Two times a day (BID) | INTRAMUSCULAR | Status: DC
  Administered 2020-12-30 – 2020-12-31 (×3): 80 mg via INTRAVENOUS
  Filled 2020-12-30 (×3): qty 8

## 2020-12-30 MED ORDER — HEPARIN BOLUS VIA INFUSION
2500.0000 [IU] | Freq: Once | INTRAVENOUS | Status: AC
  Administered 2020-12-30: 2500 [IU] via INTRAVENOUS
  Filled 2020-12-30: qty 2500

## 2020-12-30 MED ORDER — HEPARIN BOLUS VIA INFUSION
3000.0000 [IU] | Freq: Once | INTRAVENOUS | Status: AC
  Administered 2020-12-30: 3000 [IU] via INTRAVENOUS
  Filled 2020-12-30: qty 3000

## 2020-12-30 MED ORDER — SODIUM CHLORIDE 0.9 % IV SOLN: INTRAVENOUS | Status: DC

## 2020-12-30 MED ORDER — INFLUENZA VAC SPLIT QUAD 0.5 ML IM SUSY
0.5000 mL | PREFILLED_SYRINGE | INTRAMUSCULAR | Status: DC
  Filled 2020-12-30 (×3): qty 0.5

## 2020-12-30 MED ORDER — HEPARIN (PORCINE) 25000 UT/250ML-% IV SOLN
1900.0000 [IU]/h | INTRAVENOUS | Status: AC
  Administered 2020-12-30: 1450 [IU]/h via INTRAVENOUS
  Administered 2020-12-30: 1200 [IU]/h via INTRAVENOUS
  Administered 2020-12-31: 1700 [IU]/h via INTRAVENOUS
  Filled 2020-12-30 (×4): qty 250

## 2020-12-30 NOTE — Progress Notes (Signed)
Family Medicine Teaching Service Daily Progress Note Intern Pager: [phone]  Patient name: [name] Medical record number: [id number] Date of birth: 1955-05-13 Age: 65 y.o. Gender: male  Primary Care Provider: [name], MD Consultants: cardiology Code Status: DNR  Pt Overview and Major Events to Date:  12/29/2020-admitted  Assessment and Plan: [name] is a 65 year old male who presented with abdominal pain, shortness of breath concerning for CHF exacerbation.  Past medical history significant for HFrEF, HTN, HLD, type 2 diabetes, s/p bilateral BKA, CKD stage III, prior CVA, seizure disorder  Acute on chronic HFrEF Patient was admitted for CHF exacerbation 2 weeks ago with echo from 12/19/2018 showing EF of 20-25% with global left hypokinesis and grade 3 diastolic dysfunction and moderate to severe tricuspid regurg.  BNP elevated to 2475 yesterday.  Overnight patient developed new oxygen requirement and was placed on 1 liters via [place].  Patient is currently on room air with saturations in the mid 90s.  Patient was given Lasix 40 mg IV x2 overnight with total urinary output of 600 mL.  At home takes lasix 40 mg daily.  -Lasix 80 mg IV BID -Strict I's/O's -Daily weights -Cardiology consulted -Continue home beta-blocker, Imdur, and Entresto -PT/OT eval and treat  Elevated troponin Troponin elevated to 354>626.  EKG showed no changes to prior.  Cardiology was consulted and diagnosed patient with NSTEMI versus demand ischemia in setting of heart failure exacerbation and will consider right and left heart cath. -Cardiology consulted -Heparin gtt -NPO for possible right and left heart cath today  Type 2 diabetes A1c is 6.1 during admission 2 weeks ago.  Patient was discharged on Januvia 800 mg daily, metformin 1000 mg twice daily.  Glucose of 125 this morning. -Sensitive SSI -CBG monitoring before every meal and nightly -Holding home Januvia and  metformin  Hypertension Blood pressure 113/80 today -Continue home bisoprolol 5 mg daily -Continue home hydralazine 50 mg every 8 hours  CKD stage IIIb Creatinine today of 1.64, stable from 1.65 yesterday.  Baseline creatinine appears to be 1.5 -Daily BMP  Seizure disorder -Continue home Keppra and Depakote  HLD -Continue home atorvastatin 40 mg daily FEN/GI: NPO PPx: Lovenox Dispo:Home pending clinical improvement .   Subjective:  Patient denies chest pain this morning, states he has a little shortness of breath which is unchanged in the past few weeks.  States his abdominal pain he was having yesterday has improved and rates it as a 5/10 today.  Objective: Temp:  [98 F (36.7 C)-100 F (37.8 C)] 98 F (36.7 C) (10/28 0455) Pulse Rate:  [54-88] 54 (10/28 0455) Resp:  [17-20] 18 (10/28 0455) BP: (120-161)/(82-139) 120/83 (10/28 0455) SpO2:  [88 %-100 %] 88 % (10/28 0455) FiO2 (%):  [3 %] 3 % (10/27 2054) Weight:  [93.3 kg] 93.3 kg (10/28 0054) Physical Exam: General: Patient lying in bed, NAD Cardiovascular: RRR, normal S1/S2 Respiratory: No crackles, no wheeze, breath sounds diminished  Abdomen: Soft, nontender to palpation, nondistended, normal bowel sounds   Laboratory: Recent Labs  Lab 12/29/20 1155 12/30/20 0122  WBC 10.0 9.6  HGB 12.7* 11.6*  HCT 40.8 37.9*  PLT 365 341   Recent Labs  Lab 12/27/20 1009 12/29/20 1155 12/30/20 0122  NA 139 137 141  K 4.2 4.1 4.1  CL 105 104 106  CO2 26 24 28   BUN 30* 30* 27*  CREATININE 1.56* 1.65* 1.64*  CALCIUM 8.5* 8.7* 8.6*  PROT  --  6.3* 5.4*  BILITOT  --  1.4* 1.3*  ALKPHOS  --  90 80  ALT  --  42 33  AST  --  34 27  GLUCOSE 154* 133* 153[name], DO 12/30/2020, 5:16 AM PGY-1, [place] Intern pager: [phone], text pages welcome

## 2020-12-30 NOTE — Progress Notes (Signed)
ANTICOAGULATION CONSULT NOTE - Initial Consult  Pharmacy Consult for heparin Indication:  NSTEMI vs demand ischemia  No Known Allergies  Patient Measurements: Height: 5\' 6"  (167.6 cm) Weight: 93.3 kg (205 lb 11 oz) IBW/kg (Calculated) : 63.8 Heparin Dosing Weight: 85kg  Vital Signs: Temp: 98.2 F (36.8 C) (10/27 2054) Temp Source: Oral (10/27 2054) BP: 145/102 (10/27 2054) Pulse Rate: 63 (10/27 2054)  Labs: Recent Labs    12/27/20 1009 12/29/20 1155 12/29/20 1207 12/29/20 2111 12/29/20 2130 12/29/20 2304  HGB  --  12.7*  --   --   --   --   HCT  --  40.8  --   --   --   --   PLT  --  365  --   --   --   --   CREATININE 1.56* 1.65*  --   --   --   --   TROPONINIHS  --   --    < > 605* 626* 639*   < > = values in this interval not displayed.    Estimated Creatinine Clearance: 48.4 mL/min (A) (by C-G formula based on SCr of 1.65 mg/dL (H)).   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   unknown   Alogliptin Benzoate 25 MG TABS Take 25 mg by mouth daily.   12/28/2020 at 1200   aspirin 81 MG EC tablet Take 81 mg by mouth daily.   12/28/2020 at 0700   atorvastatin (LIPITOR) 40 MG tablet Take 40 mg by mouth at bedtime.    12/27/2020 at 2100   bisacodyl (DULCOLAX) 5 MG EC tablet Take 10 mg by mouth daily as needed for moderate constipation.   never   bisoprolol (ZEBETA) 5 MG tablet Take 1 tablet (5 mg total) by mouth daily. 30 tablet 1 12/28/2020 at 0700   divalproex (DEPAKOTE) 125 MG DR tablet Take 125 mg by mouth in the morning and at bedtime.   12/28/2020 at 0730   dorzolamide-timolol (COSOPT) 22.3-6.8 MG/ML ophthalmic solution Place 1 drop into the left eye 2 (two) times daily. 10 mL 12 12/28/2020 at 1700   furosemide (LASIX) 40 MG tablet Take 1 tablet (40 mg total) by mouth daily. 30 tablet  12/28/2020 at 0900   GLUCAGEN HYPOKIT 1 MG SOLR injection Inject 1 mg  into the muscle once as needed for low blood sugar.   never   hydrALAZINE (APRESOLINE) 50 MG tablet Take 1 tablet (50 mg total) by mouth every 8 (eight) hours. (Patient taking differently: Take 75 mg by mouth every 8 (eight) hours.)   12/28/2020 at 1400   isosorbide mononitrate (IMDUR) 30 MG 24 hr tablet Take 1 tablet (30 mg total) by mouth daily.   12/28/2020 at 0900   levETIRAcetam (KEPPRA) 750 MG tablet Take 1 tablet (750 mg total) by mouth 2 (two) times daily. 60 tablet 0 12/28/2020 at 0900   metFORMIN (GLUCOPHAGE) 1000 MG tablet Take 1,000 mg by mouth 2 (two) times daily with a meal.   12/28/2020 at 0900   pantoprazole (PROTONIX) 20 MG tablet Take 20 mg by mouth daily before breakfast.   12/28/2020 at 0730   Polyethyl Glycol-Propyl Glycol (SYSTANE) 0.4-0.3 % GEL ophthalmic gel Place 1 application into both eyes every 8 (eight) hours.   12/28/2020 at 1400   polyethylene glycol (MIRALAX / GLYCOLAX) packet Take 17 g by mouth 2 (two) times daily. 14 each 0 12/28/2020 at 1700   RHOPRESSA  0.02 % SOLN Place 1 drop into the left eye at bedtime.   12/27/2020 at 2100   sacubitril-valsartan (ENTRESTO) 24-26 MG Take 1 tablet by mouth 2 (two) times daily. 60 tablet 3 12/28/2020 at 0900   senna-docusate (SENOKOT-S) 8.6-50 MG tablet Take 1 tablet by mouth at bedtime.   12/27/2020 at 2100   sertraline (ZOLOFT) 50 MG tablet Take 50 mg by mouth in the morning.   12/28/2020 at 0730   tamsulosin (FLOMAX) 0.4 MG CAPS capsule Take 2 capsules (0.8 mg total) by mouth daily after supper. 60 capsule 0 12/27/2020 at 1900   White Petrolatum-Mineral Oil (ALTALUBE) 85-15 % OINT Place 1 application into both eyes at bedtime.   12/28/2020 at 2100   brimonidine (ALPHAGAN) 0.2 % ophthalmic solution PLACE 1 DROP INTO THE LEFT EYE 2 (TWO) TIMES DAILY. (Patient not taking: Reported on 12/29/2020) 5 mL 0 Not Taking   Scheduled:   aspirin EC  81 mg Oral Daily   atorvastatin  40 mg Oral QHS   bisoprolol  5 mg Oral Daily   divalproex  125 mg Oral Q12H   hydrALAZINE  50 mg Oral Q8H   insulin aspart  0-9 Units Subcutaneous TID WC   isosorbide mononitrate  30 mg Oral Daily   levETIRAcetam  750 mg Oral BID   polyethylene glycol  17 g Oral BID   sacubitril-valsartan  1 tablet Oral BID   senna-docusate  1 tablet Oral QHS   sertraline  50 mg Oral q AM   tamsulosin  0.8 mg Oral QPC supper    Assessment: 65yo male admitted with acute on chronic HF, troponin found to be elevated >> to begin heparin.  Goal of Therapy:  Heparin level 0.3-0.7 units/ml Monitor platelets by anticoagulation protocol: Yes   Plan:  Heparin 3000 units IV bolus x1 followed by infusion at 1200 units/hr and monitor heparin levels and CBC.  Wynona Neat, PharmD, BCPS  12/30/2020,12:42 AM

## 2020-12-30 NOTE — Progress Notes (Signed)
ANTICOAGULATION CONSULT NOTE - Initial Consult  Pharmacy Consult for heparin Indication:  NSTEMI vs demand ischemia  No Known Allergies  Patient Measurements: Height: 5\' 6"  (167.6 cm) Weight: 93.3 kg (205 lb 11 oz) IBW/kg (Calculated) : 63.8 Heparin Dosing Weight: 85kg  Vital Signs: Temp: 99.1 F (37.3 C) (10/28 0910) Temp Source: Oral (10/28 0910) BP: 113/80 (10/28 0910) Pulse Rate: 58 (10/28 0910)  Labs: Recent Labs    12/29/20 1155 12/29/20 1207 12/29/20 2130 12/29/20 2304 12/30/20 0122 12/30/20 0912  HGB 12.7*  --   --   --  11.6*  --   HCT 40.8  --   --   --  37.9*  --   PLT 365  --   --   --  341  --   HEPARINUNFRC  --   --   --   --   --  <0.10*  CREATININE 1.65*  --   --   --  1.64*  --   TROPONINIHS  --    < > 626* 639* 738*  --    < > = values in this interval not displayed.     Estimated Creatinine Clearance: 48.7 mL/min (A) (by C-G formula based on SCr of 1.64 mg/dL (H)).   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   unknown   Alogliptin Benzoate 25 MG TABS Take 25 mg by mouth daily.   12/28/2020 at 1200   aspirin 81 MG EC tablet Take 81 mg by mouth daily.   12/28/2020 at 0700   atorvastatin (LIPITOR) 40 MG tablet Take 40 mg by mouth at bedtime.    12/27/2020 at 2100   bisacodyl (DULCOLAX) 5 MG EC tablet Take 10 mg by mouth daily as needed for moderate constipation.   never   bisoprolol (ZEBETA) 5 MG tablet Take 1 tablet (5 mg total) by mouth daily. 30 tablet 1 12/28/2020 at 0700   divalproex (DEPAKOTE) 125 MG DR tablet Take 125 mg by mouth in the morning and at bedtime.   12/28/2020 at 0730   dorzolamide-timolol (COSOPT) 22.3-6.8 MG/ML ophthalmic solution Place 1 drop into the left eye 2 (two) times daily. 10 mL 12 12/28/2020 at 1700   furosemide (LASIX) 40 MG tablet Take 1 tablet (40 mg total) by mouth daily. 30 tablet  12/28/2020  at 0900   GLUCAGEN HYPOKIT 1 MG SOLR injection Inject 1 mg into the muscle once as needed for low blood sugar.   never   hydrALAZINE (APRESOLINE) 50 MG tablet Take 1 tablet (50 mg total) by mouth every 8 (eight) hours. (Patient taking differently: Take 75 mg by mouth every 8 (eight) hours.)   12/28/2020 at 1400   isosorbide mononitrate (IMDUR) 30 MG 24 hr tablet Take 1 tablet (30 mg total) by mouth daily.   12/28/2020 at 0900   levETIRAcetam (KEPPRA) 750 MG tablet Take 1 tablet (750 mg total) by mouth 2 (two) times daily. 60 tablet 0 12/28/2020 at 0900   metFORMIN (GLUCOPHAGE) 1000 MG tablet Take 1,000 mg by mouth 2 (two) times daily with a meal.   12/28/2020 at 0900   pantoprazole (PROTONIX) 20 MG tablet Take 20 mg by mouth daily before breakfast.   12/28/2020 at 0730   Polyethyl Glycol-Propyl Glycol (SYSTANE) 0.4-0.3 % GEL ophthalmic gel Place 1 application into both eyes every 8 (eight) hours.   12/28/2020 at 1400   polyethylene glycol (MIRALAX / GLYCOLAX) packet Take 17 g by mouth 2 (two) times  daily. 14 each 0 12/28/2020 at 1700   RHOPRESSA 0.02 % SOLN Place 1 drop into the left eye at bedtime.   12/27/2020 at 2100   sacubitril-valsartan (ENTRESTO) 24-26 MG Take 1 tablet by mouth 2 (two) times daily. 60 tablet 3 12/28/2020 at 0900   senna-docusate (SENOKOT-S) 8.6-50 MG tablet Take 1 tablet by mouth at bedtime.   12/27/2020 at 2100   sertraline (ZOLOFT) 50 MG tablet Take 50 mg by mouth in the morning.   12/28/2020 at 0730   tamsulosin (FLOMAX) 0.4 MG CAPS capsule Take 2 capsules (0.8 mg total) by mouth daily after supper. 60 capsule 0 12/27/2020 at 1900   White Petrolatum-Mineral Oil (ALTALUBE) 85-15 % OINT Place 1 application into both eyes at bedtime.   12/28/2020 at 2100   brimonidine (ALPHAGAN) 0.2 % ophthalmic solution PLACE 1 DROP INTO THE LEFT EYE 2 (TWO) TIMES DAILY. (Patient not taking: Reported on 12/29/2020) 5 mL 0 Not Taking   Scheduled:   aspirin EC  81 mg Oral Daily   atorvastatin  40 mg Oral QHS   bisoprolol  5 mg Oral Daily   divalproex  125 mg Oral Q12H   heparin  2,500 Units Intravenous Once   hydrALAZINE  50 mg Oral Q8H   insulin aspart  0-9 Units Subcutaneous TID WC   isosorbide mononitrate  30 mg Oral Daily   levETIRAcetam  750 mg Oral BID   polyethylene glycol  17 g Oral BID   sacubitril-valsartan  1 tablet Oral BID   senna-docusate  1 tablet Oral QHS   sertraline  50 mg Oral q AM   tamsulosin  0.8 mg Oral QPC supper    Assessment: 65yo male admitted with acute on chronic HF, troponin found to be elevated >> to begin heparin.  Heparin level was sub therapeutic at <0.1 @1200  units/hr. Hgb did decrease from 12.7 to 11.6. Per RN, no s/sx of bleeding, heparin infusion has not been paused nor have there been any issues with the infusion. Confirmed that level was drawn appropriately. Will give patient another bolus and increase the heparin rate.   Goal of Therapy:  Heparin level 0.3-0.7 units/ml Monitor platelets by anticoagulation protocol: Yes   Plan:   Re-bolus with heparin 2500 units IV x1 Increase heparin gtt to 1450 units/hr Monitor CBC, heparin level, and s/sx of  bleeding  Pauletta Browns, Pharm.D. PGY-1 Pharmacy Resident IQNVV:[phone] 12/30/2020 10:41 AM

## 2020-12-30 NOTE — Progress Notes (Signed)
ANTICOAGULATION CONSULT NOTE - Follow Up Consult  Pharmacy Consult for Heparin Indication: chest pain/ACS  No Known Allergies  Patient Measurements: Height: 5\' 6"  (167.6 cm) Weight: 93.3 kg (205 lb 11 oz) IBW/kg (Calculated) : 63.8 Heparin Dosing Weight:  83.8 kg  Vital Signs: Temp: 98.8 F (37.1 C) (10/28 1142) Temp Source: Oral (10/28 1142) BP: 127/90 (10/28 1142) Pulse Rate: 57 (10/28 1142)  Labs: Recent Labs    12/29/20 1155 12/29/20 1207 12/30/20 0122 12/30/20 0912 12/30/20 1144 12/30/20 1524 12/30/20 1915  HGB 12.7*  --  11.6*  --   --   --   --   HCT 40.8  --  37.9*  --   --   --   --   PLT 365  --  341  --   --   --   --   HEPARINUNFRC  --   --   --  <0.10*  --   --  <0.10*  CREATININE 1.65*  --  1.64*  --   --   --   --   TROPONINIHS  --    < > 738*  --  1,183* 1,208*  --    < > = values in this interval not displayed.    Estimated Creatinine Clearance: 48.7 mL/min (A) (by C-G formula based on SCr of 1.64 mg/dL (H)).   Assessment:  Anticoag: hep gtt, CBC stable but Hgb trending down. Monitor - Hep level still <0.1 despite rate increase.Troponins con't to rise.  Goal of Therapy:  Heparin level 0.3-0.7 units/ml Monitor platelets by anticoagulation protocol: Yes   Plan:  Rebolus heparin 30 units/kg (2500 units) and increase to 1700 units/hr Will recheck heparin level with AM labs.   Naje Rice S. Alford Highland, PharmD, BCPS Clinical Staff Pharmacist [email or web address] Alford Highland, [place] 12/30/2020,8:21 PM

## 2020-12-30 NOTE — Progress Notes (Signed)
FPTS Interim Progress Note  S: patient found resting comfortably.   O: BP 112/82 (BP Location: Right Arm)   Pulse (!) 57   Temp 98.2 F (36.8 C)   Resp 18   Ht 5\' 6"  (1.676 m)   Wt 205 lb 11 oz (93.3 kg)   SpO2 90%   BMI 33.20 kg/m    Physical exam General: NAD, resting peacefully Pulmonary: unlabored respirations  A/P: 65 year old male with HFrEF exacerbation, elevated trops, now trending flat (1,200->1,100), on heparin gtt. Watching UOP closely to determine need for increased diuresis tomorrow -Follow up AM troponin, pharm to adjust heparin level -No changes, remainder per day team.   Corky Sox, MD PGY-1 [place] Intern pager: [phone], text pages welcome

## 2020-12-30 NOTE — Progress Notes (Signed)
Patient expresses dissatisfaction with frequency of lab draws and refuses troponin level lab tonight. Notified physician; as troponin appears to be flat, lab draw may be delayed until heparin level draw at 0500 10/29. Patient expresses appreciation for this solution.

## 2020-12-30 NOTE — Plan of Care (Signed)
Personally seen and examined. Agree with cardiology colleague note filed in PM with interval changes.  Briefly 70 y M with NICM and PAD with bilateral BKA, blindness who presented after recent discharge with residual SOB and DOE.  Patient notes abdominal pain is improving; notes that he is SOB, but is improved.  No stump pain. No chest pain. Exam notable for L eye blindness, non-tender abdominal distention, and coarse breath sounds.  Labs notable for troponin 300-> 639--> 738.  Creatinine 1.65 from 1.64, BNP is 2475  Would recommend  - principally this is suggestive of demand ischemia from known HF; slight elevation from recent admission 12/17/20. - will continue heparin for now and I've order troponin assays to trend until down fall; if continuing to climb we can consider LHC - Agree with ASA, bisoprolol and Entresto, if stable kidney function through coarse we can stop hydralazine in favor of higher dose entresto which could help with medication adherence and decreased bill burden - I do not have plans for [facility] and [facility] today; paged FM team; if there are no tests planned for his abdominal pain he can eat - lasix 80 IV BID is a reasonable next dose; I predict he will need significant diuresis prior to DC, will consider increased diuretic 12/31/20 (100 IV BID unless -3L net negative)  Rudean Haskell, MD Cardiologist [facility]  [address] [place], [place] [address] [phone]  11:34 AM

## 2020-12-31 DIAGNOSIS — I509 Heart failure, unspecified: Secondary | ICD-10-CM | POA: Diagnosis not present

## 2020-12-31 DIAGNOSIS — I5023 Acute on chronic systolic (congestive) heart failure: Secondary | ICD-10-CM

## 2020-12-31 LAB — COMPREHENSIVE METABOLIC PANEL
ALT: 30 U/L (ref 0–44)
AST: 22 U/L (ref 15–41)
Albumin: 2.4 g/dL — ABNORMAL LOW (ref 3.5–5.0)
Alkaline Phosphatase: 71 U/L (ref 38–126)
Anion gap: 7 (ref 5–15)
BUN: 27 mg/dL — ABNORMAL HIGH (ref 8–23)
CO2: 29 mmol/L (ref 22–32)
Calcium: 8.3 mg/dL — ABNORMAL LOW (ref 8.9–10.3)
Chloride: 103 mmol/L (ref 98–111)
Creatinine, Ser: 1.78 mg/dL — ABNORMAL HIGH (ref 0.61–1.24)
GFR, Estimated: 42 mL/min — ABNORMAL LOW (ref >60)
Glucose, Bld: 131 mg/dL — ABNORMAL HIGH (ref 70–99)
Potassium: 4.2 mmol/L (ref 3.5–5.1)
Sodium: 139 mmol/L (ref 135–145)
Total Bilirubin: 0.9 mg/dL (ref 0.3–1.2)
Total Protein: 5 g/dL — ABNORMAL LOW (ref 6.5–8.1)

## 2020-12-31 LAB — GLUCOSE, CAPILLARY
Glucose-Capillary: 113 mg/dL — ABNORMAL HIGH (ref 70–99)
Glucose-Capillary: 164 mg/dL — ABNORMAL HIGH (ref 70–99)

## 2020-12-31 LAB — CBC
HCT: 35.7 % — ABNORMAL LOW (ref 39.0–52.0)
Hemoglobin: 11.1 g/dL — ABNORMAL LOW (ref 13.0–17.0)
MCH: 26.1 pg (ref 26.0–34.0)
MCHC: 31.1 g/dL (ref 30.0–36.0)
MCV: 83.8 fL (ref 80.0–100.0)
Platelets: 304 10*3/uL (ref 150–400)
RBC: 4.26 MIL/uL (ref 4.22–5.81)
RDW: 15.9 % — ABNORMAL HIGH (ref 11.5–15.5)
WBC: 8.6 10*3/uL (ref 4.0–10.5)
nRBC: 0 % (ref 0.0–0.2)

## 2020-12-31 LAB — HEPARIN LEVEL (UNFRACTIONATED)
Heparin Unfractionated: 0.2 IU/mL — ABNORMAL LOW (ref 0.30–0.70)
Heparin Unfractionated: 0.18 IU/mL — ABNORMAL LOW (ref 0.30–0.70)
Heparin Unfractionated: 0.57 IU/mL (ref 0.30–0.70)

## 2020-12-31 LAB — TROPONIN I (HIGH SENSITIVITY): Troponin I (High Sensitivity): 897 ng/L (ref <18)

## 2020-12-31 MED ORDER — FUROSEMIDE 10 MG/ML IJ SOLN
120.0000 mg | Freq: Two times a day (BID) | INTRAVENOUS | Status: DC
  Administered 2020-12-31 – 2021-01-03 (×6): 120 mg via INTRAVENOUS
  Filled 2020-12-31: qty 10
  Filled 2020-12-31 (×4): qty 12
  Filled 2020-12-31 (×2): qty 10

## 2020-12-31 MED ORDER — HEPARIN BOLUS VIA INFUSION
2500.0000 [IU] | Freq: Once | INTRAVENOUS | Status: AC
  Administered 2020-12-31: 2500 [IU] via INTRAVENOUS
  Filled 2020-12-31: qty 2500

## 2020-12-31 NOTE — NC FL2 (Signed)
[place] LEVEL OF CARE SCREENING TOOL     IDENTIFICATION  Patient Name: [name] Birthdate: 10/23/1955 Sex: male Admission Date (Current Location): 12/29/2020  [facility] and [place] Number:  Herbalist and Address:  [facility] [place], [place] [address] [place], [place] [address]      Provider Number: [id number]  Attending Physician Name and Address:  Lenoria Chime, MD  Relative Name and Phone Number:       Current Level of Care: Hospital Recommended Level of Care: [place] Prior Approval Number:    Date Approved/Denied:   PASRR Number: [id number]  Discharge Plan: SNF    Current Diagnoses: Patient Active Problem List   Diagnosis Date Noted   Acute on chronic heart failure ([place]) 12/29/2020   Elevated troponin 12/17/2020   Malnutrition of moderate degree 06/24/2020   Type 2 diabetes mellitus with diabetic peripheral angiopathy and gangrene, without long-term current use of insulin ([place])    Necrotizing fasciitis of lower leg ([place]) 06/22/2020   Type 2 diabetes mellitus with foot ulcer and gangrene ([place])    Abscess of left foot    Foot infection    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   Irregular heart rate    Acute encephalopathy 01/31/2017   Cellulitis of right upper extremity    Anasarca    Altered mental status    Slurred speech    Hypothermia 01/30/2017   Pressure injury of skin 12/21/2016   CHF exacerbation ([place]) 12/20/2016   Peripheral edema    Benign prostatic hyperplasia with lower urinary tract symptoms    Embolic stroke ([place]) 62/83/6629   Cardiomyopathy, dilated ([place]) 10/19/2016   Acute on chronic systolic heart failure, NYHA class 3 ([place]) 10/19/2016   Moderate to severe pulmonary hypertension ([place]) 10/19/2016   Severe tricuspid regurgitation by prior echocardiogram 10/19/2016   Diabetes mellitus type 2 in obese ([place]) 10/19/2016   HLD (hyperlipidemia) 10/19/2016   Seizure ([place])  10/19/2016   Fluid overload 10/14/2016   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 ([place]) 10/09/2016   Cardiorenal syndrome with renal failure 09/28/2016   Acute urinary retention 09/26/2016   Acute on chronic combined systolic and diastolic CHF (congestive heart failure) ([place]) 09/26/2016   Scrotal edema 09/24/2016   Multiple open wounds of lower leg, initial encounter 09/24/2016   Tinea of groin 09/24/2016   Chronic kidney disease (CKD), stage III (moderate) ([place]) 06/07/2016   Chronic combined systolic and diastolic congestive heart failure (HCC)    Protein calorie malnutrition ([place]) 07/12/2015   Congestive dilated cardiomyopathy ([place]) 07/11/2015   Pressure ulcer 07/09/2015   Open knee wound 07/06/2015   History of right below knee amputation ([place]) 02/22/2015   PAD (peripheral artery disease) ([place]) 02/22/2015   Blindness of left eye 47/65/4650   Complications, amputation stump late ([place]) 08/06/2014   Diabetic neuropathy associated with type 2 diabetes mellitus ([place]) 12/01/2013   Noncompliance with medications 07/13/2010   OTHER SPEC TYPES SCHIZOPHRENIA UNSPEC CONDITION 05/24/2009   Obesity, unspecified 06/09/2007   DM (diabetes mellitus), type 2, uncontrolled 05/02/2006   Essential hypertension 05/02/2006    Orientation RESPIRATION BLADDER Height & Weight     Self, Time, Situation, Place  Normal Incontinent, External catheter Weight: 201 lb 4.5 oz (91.3 kg) Height:  5\' 6"  (167.6 cm)  BEHAVIORAL SYMPTOMS/MOOD NEUROLOGICAL BOWEL NUTRITION STATUS      Continent Diet (see dc summary)  AMBULATORY STATUS COMMUNICATION OF NEEDS  Skin   Extensive Assist   PU Stage and Appropriate Care (Stage II on pretibial proximal and leg)                       Personal Care Assistance Level of Assistance  Bathing, Feeding, Dressing Bathing Assistance: Maximum assistance Feeding assistance: Independent (needs set up) Dressing Assistance: Maximum  assistance     Functional Limitations Info  Sight Sight Info: Impaired        SPECIAL CARE FACTORS FREQUENCY                       Contractures Contractures Info: Not present    Additional Factors Info  Code Status, Allergies, Psychotropic Code Status Info: DNR Allergies Info: NKA Psychotropic Info: Depakote; Zoloft         Current Medications (12/31/2020):  This is the current hospital active medication list Current Facility-Administered Medications  Medication Dose Route Frequency Provider Last Rate Last Admin   0.9 %  sodium chloride infusion   Intravenous Continuous [place], Peter M, MD 10 mL/hr at 12/30/20 0931 Rate Change at 12/30/20 0931   aspirin EC tablet 81 mg  81 mg Oral Daily Alcus Dad, MD   81 mg at 12/30/20 0926   atorvastatin (LIPITOR) tablet 40 mg  40 mg Oral QHS Alcus Dad, MD   40 mg at 12/30/20 2139   bisoprolol (ZEBETA) tablet 5 mg  5 mg Oral Daily Alcus Dad, MD   5 mg at 12/30/20 0930   divalproex (DEPAKOTE) DR tablet 125 mg  125 mg Oral Q12H Alcus Dad, MD   125 mg at 12/30/20 2138   furosemide (LASIX) 120 mg in dextrose 5 % 50 mL IVPB  120 mg Intravenous BID Chandrasekhar, Mahesh A, MD       heparin ADULT infusion 100 units/mL (25000 units/222mL)  1,900 Units/hr Intravenous Continuous Lenoria Chime, MD 19 mL/hr at 12/31/20 1152 1,900 Units/hr at 12/31/20 1152   heparin bolus via infusion 2,500 Units  2,500 Units Intravenous Once Wise, Nason S, RPH       hydrALAZINE (APRESOLINE) tablet 50 mg  50 mg Oral Q8H Alcus Dad, MD   50 mg at 12/31/20 4259   influenza vac split quadrivalent PF (FLUARIX) injection 0.5 mL  0.5 mL Intramuscular Tomorrow-1000 Leeanne Rio, MD       isosorbide mononitrate (IMDUR) 24 hr tablet 30 mg  30 mg Oral Daily Alcus Dad, MD   30 mg at 12/30/20 5638   levETIRAcetam (KEPPRA) tablet 750 mg  750 mg Oral BID Alcus Dad, MD   750 mg at 12/30/20 2138   polyethylene glycol (MIRALAX /  GLYCOLAX) packet 17 g  17 g Oral BID Alcus Dad, MD   17 g at 12/29/20 2316   sacubitril-valsartan (ENTRESTO) 24-26 mg per tablet  1 tablet Oral BID Alcus Dad, MD   1 tablet at 12/30/20 2138   senna-docusate (Senokot-S) tablet 1 tablet  1 tablet Oral QHS Alcus Dad, MD   1 tablet at 12/29/20 2315   sertraline (ZOLOFT) tablet 50 mg  50 mg Oral q AM Alcus Dad, MD   50 mg at 12/31/20 0741   tamsulosin (FLOMAX) capsule 0.8 mg  0.8 mg Oral QPC supper Alcus Dad, MD   0.8 mg at 12/30/20 1715     Discharge Medications: Please see discharge summary for a list of discharge medications.  Relevant Imaging Results:  Relevant Lab Results:   Additional Information SS#:  [id number]  Benard Halsted, LCSW

## 2020-12-31 NOTE — Progress Notes (Signed)
PT Cancellation Note  Patient Details Name: [name] MRN: [id number] DOB: May 25, 1955   Cancelled Treatment:    Reason Eval/Treat Not Completed: PT screened, patient LTC SNF resident, no acute PT needs identified. Will sign off.  Mabeline Caras, PT, DPT Acute Rehabilitation Services  Pager [phone] Office [place] 12/31/2020, 4:38 PM

## 2020-12-31 NOTE — Progress Notes (Signed)
FPTS Interim Progress Note  S:Patient resting comfortably, denies any concerns at this time.   O: BP 127/90 (BP Location: Right Arm)   Pulse 60   Temp 98.5 F (36.9 C) (Oral)   Resp 20   Ht 5\' 6"  (1.676 m)   Wt 91.3 kg   SpO2 94%   BMI 32.49 kg/m   General: Patient resting comfortably in bed, in no acute distress. CV: RRR, no murmurs or gallops auscultated Resp: CTAB, no wheezing or rales noted, breathing comfortably on room air  Ext: radial pulses strong and equal bilaterally Psych: mood appropriate   A/P: 65 year old male with acute on chronic HFrEF with significantly elevated troponins, and on heparin.  -cardiology following, appreciate continued involvement and recommendations -on heparin drip  -continue to monitor troponin level, most recently 897, continues to downtrend  -continue current plan as per day team   Donney Dice, DO 12/31/2020, 9:33 PM PGY-2, [place] Medicine Service pager [phone]

## 2020-12-31 NOTE — TOC Initial Note (Signed)
Transition of Care [facility]) - Initial/Assessment Note    Patient Details  Name: [name] MRN: [id number] Date of Birth: 1955/12/04  Transition of Care [facility]) CM/SW Contact:    Benard Halsted, [place] Phone Number: 12/31/2020, 12:25 PM  Clinical Narrative:                 CSW continuing to follow for discharge back to [facility] once medically stable. Patient will require non-emergency transportation.   Expected Discharge Plan: Long Term Nursing Home Barriers to Discharge: Continued Medical Work up   Patient Goals and CMS Choice Patient states their goals for this hospitalization and ongoing recovery are:: Return to SNF      Expected Discharge Plan and Services Expected Discharge Plan: [place] In-house Referral: Clinical Social Work   Post Acute Care Choice: [place] Living arrangements for the past 2 months: [place]                                      Prior Living Arrangements/Services Living arrangements for the past 2 months: [place] Lives with:: Facility Resident Patient language and need for interpreter reviewed:: Yes        Need for Family Participation in Patient Care: No (Comment) Care giver support system in place?: Yes (comment)   Criminal Activity/Legal Involvement Pertinent to Current Situation/Hospitalization: No - Comment as needed  Activities of Daily Living      Permission Sought/Granted Permission sought to share information with : Facility Art therapist granted to share information with : Yes, Verbal Permission Granted     Permission granted to share info w AGENCY: [place]        Emotional Assessment       Orientation: : Oriented to Self, Oriented to Place, Oriented to  Time, Oriented to Situation Alcohol / Substance Use: Not Applicable Psych Involvement: No (comment)  Admission diagnosis:  Acute on chronic heart failure (HCC)  [I50.9] Acute on chronic congestive heart failure, unspecified heart failure type [facility]) [I50.9] Patient Active Problem List   Diagnosis Date Noted   Acute on chronic heart failure ([place]) 12/29/2020   Elevated troponin 12/17/2020   Malnutrition of moderate degree 06/24/2020   Type 2 diabetes mellitus with diabetic peripheral angiopathy and gangrene, without long-term current use of insulin ([place])    Necrotizing fasciitis of lower leg ([place]) 06/22/2020   Type 2 diabetes mellitus with foot ulcer and gangrene ([place])    Abscess of left foot    Foot infection    Focal motor seizure disorder ([place]) 05/06/2017   Irregular heart rate    Acute encephalopathy 01/31/2017   Cellulitis of right upper extremity    Anasarca    Altered mental status    Slurred speech    Hypothermia 01/30/2017   Pressure injury of skin 12/21/2016   CHF exacerbation ([place]) 12/20/2016   Peripheral edema    Benign prostatic hyperplasia with lower urinary tract symptoms    Embolic stroke ([place]) 25/85/2778   Cardiomyopathy, dilated ([place]) 10/19/2016   Acute on chronic systolic heart failure, NYHA class 3 ([place]) 10/19/2016   Moderate to severe pulmonary hypertension ([place]) 10/19/2016   Severe tricuspid regurgitation by prior echocardiogram 10/19/2016   Diabetes mellitus type 2 in obese ([place]) 10/19/2016   HLD (hyperlipidemia) 10/19/2016   Seizure ([place]) 10/19/2016   Fluid overload 10/14/2016   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 with ulcer and inflammation ([place]) 10/09/2016   Cardiorenal syndrome with renal failure 09/28/2016   Acute urinary retention 09/26/2016   Acute on chronic combined systolic and diastolic CHF (congestive heart failure) ([place]) 09/26/2016   Scrotal edema 09/24/2016   Multiple open wounds of lower leg, initial encounter 09/24/2016   Tinea of groin 09/24/2016   Chronic kidney disease (CKD), stage III (moderate) (HCC) 06/07/2016   Chronic combined systolic and diastolic congestive  heart failure (HCC)    Protein calorie malnutrition ([place]) 07/12/2015   Congestive dilated cardiomyopathy ([place]) 07/11/2015   Pressure ulcer 07/09/2015   Open knee wound 07/06/2015   History of right below knee amputation ([place]) 02/22/2015   PAD (peripheral artery disease) ([place]) 02/22/2015   Blindness of left eye 88/41/6606   Complications, amputation stump late ([place]) 08/06/2014   Diabetic neuropathy associated with type 2 diabetes mellitus ([place]) 12/01/2013   Noncompliance with medications 07/13/2010   OTHER SPEC TYPES SCHIZOPHRENIA UNSPEC CONDITION 05/24/2009   Obesity, unspecified 06/09/2007   DM (diabetes mellitus), type 2, uncontrolled 05/02/2006   Essential hypertension 05/02/2006   PCP:  [name], MD Pharmacy:   [place], [place] [address] [place] [address] Phone: [phone] Fax: [phone]     Social Determinants of Health (SDOH) Interventions    Readmission Risk Interventions No flowsheet data found.

## 2020-12-31 NOTE — Progress Notes (Addendum)
Family Medicine Teaching Service Daily Progress Note Intern Pager: [phone]  Patient name: [name] Medical record number: [id number] Date of birth: 10/11/1955 Age: 65 y.o. Gender: male  Primary Care Provider: [name], MD Consultants: Cardiology Code Status: DNR  Pt Overview and Major Events to Date:  12/29/2020-admitted  Assessment and Plan: [name] is a 65 year old male who presented with abdominal pain, shortness of breath concerning for CHF exacerbation.  Past medical history significant for HFrEF, HTN, HLD, type 2 diabetes, is s/p bilateral BKA, CKD stage III, prior CVA, seizure disorder  Acute on chronic HFrEF Echo from 12/18/2020 shows EF of 20/25% and grade 3 diastolic dysfunction.  BNP was elevated to 2001-75 on 12/29/2020.  Patient is currently on room air.  At home patient takes Lasix 40 mg daily.  Patient received Lasix 80 mg IV twice daily yesterday with 24-hour urinary output of 2350 mL, net output less than 1 L.   -Increase Lasix to 120 mg IV twice daily per cards -Strict I's and O's -Daily weights -Cardiology consulted -Continue home beta-blocker, Imdur, Entresto -PT/OT eval and treat -Daily BMP -Check magnesium daily   Elevated troponin Troponin was elevated from 354>626 >738>1208 and as of today downtrending to 897.  EKG showing no changes.  Cardiology suggest this is demand ischemia from known heart failure.  As long as troponins continue to decrease, patient will not need left and right heart caths.  -Continue Heparin gtt per cards until midnight for total of 48 hrs   CKD stage IIIb Creatinine of 1.78 today, up from 1.64 yesterday.  Baseline creatinine appears to be around 1.5. -Daily BMP  Type 2 diabetes A1c 6.1 on admission 2 weeks ago.  Glucose this morning of 113.  Home medications include Januvia 100 mg daily, metformin 1000 mg twice daily.  -Consider SGLT2i per cards- cards will discuss with PCP -d/c SSI and CBG monitoring before every  meal and nightly -Holding home medications   Hypertension Blood pressure today of 122/84 -Continue home -continue home hydralazine 50 mg every 8 hours  Seizure disorder -Continue home Keppra and Depakote  HLD -Continue home atorvastatin 40 mg daily  FEN/GI: Heart healthy/carb modified PPx: Heparin Dispo:SNF pending clinical improvement . Barriers include continued diuresis  Subjective:  Patient denies any chest pain.  Admits to feeling a little short of breath which she has had for several weeks.  Objective: Temp:  [98.2 F (36.8 C)-99.1 F (37.3 C)] 98.3 F (36.8 C) (10/29 0418) Pulse Rate:  [56-71] 71 (10/29 0418) Resp:  [18-20] 20 (10/29 0418) BP: (111-127)/(78-90) 122/84 (10/29 0418) SpO2:  [90 %-100 %] 95 % (10/29 0418) Weight:  [91.3 kg] 91.3 kg (10/29 0418) Physical Exam: General: Lying in bed, NAD Cardiovascular: RRR, normal S1/S2 Respiratory: CTAB, normal work of breathing Extremities: Bilateral BKA's  Laboratory: Recent Labs  Lab 12/29/20 1155 12/30/20 0122 12/31/20 0355  WBC 10.0 9.6 8.6  HGB 12.7* 11.6* 11.1*  HCT 40.8 37.9* 35.7*  PLT 365 341 304   Recent Labs  Lab 12/29/20 1155 12/30/20 0122 12/31/20 0355  NA 137 141 139  K 4.1 4.1 4.2  CL 104 106 103  CO2 24 28 29   BUN 30* 27* 27*  CREATININE 1.65* 1.64* 1.78*  CALCIUM 8.7* 8.6* 8.3*  PROT 6.3* 5.4* 5.0*  BILITOT 1.4* 1.3* 0.9  ALKPHOS 90 80 71  ALT 42 33 30  AST 34 27 22  GLUCOSE 133* 153* 131[name], DO 12/31/2020, 8:01 AM PGY-1,  [place] Intern pager: [phone], text pages welcome

## 2020-12-31 NOTE — Progress Notes (Signed)
OT Cancellation Note  Patient Details Name: KOBY PICKUP MRN: [id number] DOB: 06-Oct-1955   Cancelled Treatment:    Reason Eval/Treat Not Completed: OT screened, no needs identified, will sign off.  Patient is a LTC resident who is returning to the same facility.  Patient did not receive active rehab at his facility.  No noted changes to his functional status.  Recommend OT screen patient per the facility protocols based on his care plan.    Ainsley Deakins D Ahmere [place]emenway 12/31/2020, 5:15 PM

## 2020-12-31 NOTE — Progress Notes (Signed)
ANTICOAGULATION CONSULT NOTE   Pharmacy Consult for Heparin Indication: chest pain/ACS  No Known Allergies  Patient Measurements: Height: 5\' 6"  (167.6 cm) Weight: 91.3 kg (201 lb 4.5 oz) IBW/kg (Calculated) : 63.8 Heparin Dosing Weight:  83.8 kg  Vital Signs: Temp: 98.3 F (36.8 C) (10/29 0418) Temp Source: Oral (10/29 0418) BP: 122/84 (10/29 0418) Pulse Rate: 71 (10/29 0418)  Labs: Recent Labs    12/29/20 1155 12/29/20 1207 12/30/20 0122 12/30/20 0912 12/30/20 1144 12/30/20 1524 12/30/20 1915 12/31/20 0355  HGB 12.7*  --  11.6*  --   --   --   --  11.1*  HCT 40.8  --  37.9*  --   --   --   --  35.7*  PLT 365  --  341  --   --   --   --  304  HEPARINUNFRC  --   --   --  <0.10*  --   --  <0.10* 0.20*  CREATININE 1.65*  --  1.64*  --   --   --   --   --   TROPONINIHS  --    < > 738*  --  1,183* 1,208* 1,126*  --    < > = values in this interval not displayed.     Estimated Creatinine Clearance: 48.1 mL/min (A) (by C-G formula based on SCr of 1.64 mg/dL (H)).   Assessment: 65 y.o. M on heparin for ACS. Heparin level subtherapeutic (0.2) on infusion at 1700 units/hr. RN notes that line had to be replaced and heparin restarted at 0027 so this level is likely low due to this. No bleeding noted.  Goal of Therapy:  Heparin level 0.3-0.7 units/ml Monitor platelets by anticoagulation protocol: Yes   Plan:  Continue heparin at 1700 units/hr Will f/u 8 hr level from heparin restart  Sherlon Handing, PharmD, BCPS Please see amion for complete clinical pharmacist phone list 12/31/2020,4:51 AM

## 2020-12-31 NOTE — Progress Notes (Addendum)
ANTICOAGULATION CONSULT NOTE   Pharmacy Consult for Heparin Indication: chest pain/ACS  No Known Allergies  Patient Measurements: Height: 5\' 6"  (167.6 cm) Weight: 91.3 kg (201 lb 4.5 oz) IBW/kg (Calculated) : 63.8 Heparin Dosing Weight:  83.8 kg  Vital Signs: Temp: 98.4 F (36.9 C) (10/29 0936) Temp Source: Oral (10/29 0936) BP: 121/72 (10/29 0936) Pulse Rate: 59 (10/29 0936)  Labs: Recent Labs    12/29/20 1155 12/29/20 1207 12/30/20 0122 12/30/20 0912 12/30/20 1524 12/30/20 1915 12/31/20 0355 12/31/20 0904  HGB 12.7*  --  11.6*  --   --   --  11.1*  --   HCT 40.8  --  37.9*  --   --   --  35.7*  --   PLT 365  --  341  --   --   --  304  --   HEPARINUNFRC  --   --   --    < >  --  <0.10* 0.20* 0.18*  CREATININE 1.65*  --  1.64*  --   --   --  1.78*  --   TROPONINIHS  --    < > 738*   < > 1,208* 1,126* 897*  --    < > = values in this interval not displayed.     Estimated Creatinine Clearance: 44.4 mL/min (A) (by C-G formula based on SCr of 1.78 mg/dL (H)).   Assessment: 65 y.o. M on heparin for ACS. Heparin level subtherapeutic (0.18) on infusion at 1700 units/hr. Will increase rate and recheck in 6 hours. Infusion end time set to end at midnight tonight. CBC stable  Goal of Therapy:  Heparin level 0.3-0.7 units/ml Monitor platelets by anticoagulation protocol: Yes   Plan:  Rebolus heparin with 2500 units Increase heparin to 1900 units/hr Will f/u 6 hr level from heparin level  Thank you for allowing pharmacy to participate in this patient's care.  Reatha Harps, PharmD PGY1 Pharmacy Resident 12/31/2020 11:36 AM Check [email or web address] for unit specific pharmacy number

## 2020-12-31 NOTE — Progress Notes (Signed)
Progress Note  Patient Name: [name] Date of Encounter: 12/31/2020  Primary Cardiologist: Werner Lean, MD   Subjective    Troponin peak 1200.  No CP.  Creatinine continues to rise with neg negative 1 L.  Breathing is better but only by a little bit.  Inpatient Medications    Scheduled Meds:  aspirin EC  81 mg Oral Daily   atorvastatin  40 mg Oral QHS   bisoprolol  5 mg Oral Daily   divalproex  125 mg Oral Q12H   furosemide  80 mg Intravenous BID   hydrALAZINE  50 mg Oral Q8H   influenza vac split quadrivalent PF  0.5 mL Intramuscular Tomorrow-1000   insulin aspart  0-9 Units Subcutaneous TID WC   isosorbide mononitrate  30 mg Oral Daily   levETIRAcetam  750 mg Oral BID   polyethylene glycol  17 g Oral BID   sacubitril-valsartan  1 tablet Oral BID   senna-docusate  1 tablet Oral QHS   sertraline  50 mg Oral q AM   tamsulosin  0.8 mg Oral QPC supper   Continuous Infusions:  sodium chloride 10 mL/hr at 12/30/20 0931   heparin 1,700 Units/hr (12/31/20 0027)   PRN Meds:    Vital Signs    Vitals:   12/30/20 1142 12/30/20 2023 12/31/20 0014 12/31/20 0418  BP: 127/90 112/82 111/78 122/84  Pulse: (!) 57 (!) 57 (!) 56 71  Resp: 19 18 20 20   Temp: 98.8 F (37.1 C) 98.2 F (36.8 C) 98.6 F (37 C) 98.3 F (36.8 C)  TempSrc: Oral   Oral  SpO2: 90% 90% 100% 95%  Weight:    91.3 kg  Height:        Intake/Output Summary (Last 24 hours) at 12/31/2020 0743 Last data filed at 12/31/2020 0300 Gross per 24 hour  Intake 1575.77 ml  Output 2350 ml  Net -774.23 ml   Filed Weights   12/30/20 0039 12/30/20 0054 12/31/20 0418  Weight: 93.3 kg 93.3 kg 91.3 kg    Telemetry    Sinus rhythm - Personally Reviewed  ECG    No new - Personally Reviewed  Physical Exam   GEN: No acute distress.   Neck: elevated JVD while supine Cardiac: RRR, no murmurs, rubs, or gallops.  Respiratory: Decreased breath sounds in bases GI: Soft, nontender, distended  with fluid wave  MS:  Bilateral amputation site c/d/i Neuro:  Nonfocal  Psych: Normal affect   Labs    Chemistry Recent Labs  Lab 12/29/20 1155 12/30/20 0122 12/31/20 0355  NA 137 141 139  K 4.1 4.1 4.2  CL 104 106 103  CO2 24 28 29   GLUCOSE 133* 153* 131*  BUN 30* 27* 27*  CREATININE 1.65* 1.64* 1.78*  CALCIUM 8.7* 8.6* 8.3*  PROT 6.3* 5.4* 5.0*  ALBUMIN 3.1* 2.7* 2.4*  AST 34 27 22  ALT 42 33 30  ALKPHOS 90 80 71  BILITOT 1.4* 1.3* 0.9  GFRNONAA 46* 46* 42*  ANIONGAP 9 7 7      Hematology Recent Labs  Lab 12/29/20 1155 12/30/20 0122 12/31/20 0355  WBC 10.0 9.6 8.6  RBC 4.84 4.49 4.26  HGB 12.7* 11.6* 11.1*  HCT 40.8 37.9* 35.7*  MCV 84.3 84.4 83.8  MCH 26.2 25.8* 26.1  MCHC 31.1 30.6 31.1  RDW 15.9* 16.1* 15.9*  PLT 365 341 304    Cardiac EnzymesNo results for input(s): TROPONINI in the last 168 hours. No results for input(s): TROPIPOC in  the last 168 hours.   BNP Recent Labs  Lab 12/29/20 1207  BNP 2,475.4*     DDimer No results for input(s): DDIMER in the last 168 hours.   Radiology    DG Chest 1 View  Result Date: 12/29/2020 CLINICAL DATA:  65 year old male with history of shortness of breath and cough. Abdominal pain. EXAM: CHEST  1 VIEW COMPARISON:  Chest x-ray 12/07/2020. FINDINGS: Lung volumes are low. Bibasilar opacities are favored to reflect subsegmental atelectasis. No definite consolidative airspace disease. No definite pleural effusions. No pneumothorax. Cephalization of the pulmonary vasculature, without frank pulmonary edema. Heart size is moderately enlarged. Upper mediastinal contours are within normal limits. IMPRESSION: 1. Cardiomegaly with cephalization of the pulmonary vasculature, but no frank pulmonary edema. 2. Low lung volumes with bibasilar subsegmental atelectasis. Electronically Signed   By: Vinnie Langton M.D.   On: 12/29/2020 12:35      Patient Profile     65 y.o. male CHF, PAD, and blindness  Assessment & Plan      Heart Failure Reduced Ejection Fraction EF 20% - NYHA class IV, Stage C-D, hypervolemic, etiology from unclear  - Diuretic regimen: increased to lasix 120 IV BID - will check mag with daily labs - bisoprolol 5 with no plans to increase until euvolemic -Entresto 24/26 - aldactone can be added if stable K and no significant AKI - SGLT2i in consideration, we have not added only because we would need to better coordinate with PCP about DM control co-management, if ok with primary we would add this (sending price check today) - troponin elevation 1.2 appears to be demand related; if no sx, will continue with 48 hours of heparin planned  For questions or updates, please contact [place] Please consult [email or web address] for contact info under Cardiology/STEMI.      Signed, Werner Lean, MD  12/31/2020, 7:43 AM

## 2020-12-31 NOTE — Progress Notes (Signed)
Went to take patient his medications. Pt stated he don't want any of it. When asked why, he stated he was tired of it all. Tried offering medication again. Pt still refused.

## 2020-12-31 NOTE — Progress Notes (Signed)
ANTICOAGULATION CONSULT NOTE   Pharmacy Consult for Heparin Indication: chest pain/ACS  No Known Allergies  Patient Measurements: Height: 5\' 6"  (167.6 cm) Weight: 91.3 kg (201 lb 4.5 oz) IBW/kg (Calculated) : 63.8 Heparin Dosing Weight:  83.8 kg  Vital Signs: Temp: 98.5 F (36.9 C) (10/29 1948) Temp Source: Oral (10/29 1948) BP: 127/90 (10/29 1948) Pulse Rate: 60 (10/29 1948)  Labs: Recent Labs    12/29/20 1155 12/29/20 1207 12/30/20 0122 12/30/20 0912 12/30/20 1524 12/30/20 1915 12/31/20 0355 12/31/20 0904 12/31/20 1856  HGB 12.7*  --  11.6*  --   --   --  11.1*  --   --   HCT 40.8  --  37.9*  --   --   --  35.7*  --   --   PLT 365  --  341  --   --   --  304  --   --   HEPARINUNFRC  --   --   --    < >  --  <0.10* 0.20* 0.18* 0.57  CREATININE 1.65*  --  1.64*  --   --   --  1.78*  --   --   TROPONINIHS  --    < > 738*   < > 1,208* 1,126* 897*  --   --    < > = values in this interval not displayed.     Estimated Creatinine Clearance: 44.4 mL/min (A) (by C-G formula based on SCr of 1.78 mg/dL (H)).   Assessment: 65 y.o. M on heparin for demand related troponin elevation. Pharmacy consulted for heparin. Planning for 48hrs per cardiology.   Heparin level 0.57 is therapeutic on 1900 units/hr.    Goal of Therapy:  Heparin level 0.3-0.7 units/ml Monitor platelets by anticoagulation protocol: Yes   Plan:    Continue heparin to 1900 units/hr Monitor daily HL, CBC/plt Monitor for signs/symptoms of bleeding    Thank you for allowing pharmacy to participate in this patient's care.  Benetta Spar, PharmD, BCPS, BCCP Clinical Pharmacist  Please check AMION for all [place] phone numbers After 10:00 PM, call [place] [phone]

## 2021-01-01 DIAGNOSIS — L89892 Pressure ulcer of other site, stage 2: Secondary | ICD-10-CM

## 2021-01-01 DIAGNOSIS — I5023 Acute on chronic systolic (congestive) heart failure: Secondary | ICD-10-CM | POA: Diagnosis not present

## 2021-01-01 DIAGNOSIS — I509 Heart failure, unspecified: Secondary | ICD-10-CM

## 2021-01-01 DIAGNOSIS — E1159 Type 2 diabetes mellitus with other circulatory complications: Secondary | ICD-10-CM | POA: Diagnosis not present

## 2021-01-01 DIAGNOSIS — Z89512 Acquired absence of left leg below knee: Secondary | ICD-10-CM | POA: Diagnosis not present

## 2021-01-01 LAB — BASIC METABOLIC PANEL
Anion gap: 6 (ref 5–15)
Anion gap: 7 (ref 5–15)
BUN: 23 mg/dL (ref 8–23)
BUN: 23 mg/dL (ref 8–23)
CO2: 32 mmol/L (ref 22–32)
CO2: 32 mmol/L (ref 22–32)
Calcium: 8.3 mg/dL — ABNORMAL LOW (ref 8.9–10.3)
Calcium: 8.4 mg/dL — ABNORMAL LOW (ref 8.9–10.3)
Chloride: 101 mmol/L (ref 98–111)
Chloride: 102 mmol/L (ref 98–111)
Creatinine, Ser: 1.55 mg/dL — ABNORMAL HIGH (ref 0.61–1.24)
Creatinine, Ser: 1.56 mg/dL — ABNORMAL HIGH (ref 0.61–1.24)
GFR, Estimated: 50 mL/min — ABNORMAL LOW (ref >60)
GFR, Estimated: 49 mL/min — ABNORMAL LOW (ref >60)
Glucose, Bld: 108 mg/dL — ABNORMAL HIGH (ref 70–99)
Glucose, Bld: 144 mg/dL — ABNORMAL HIGH (ref 70–99)
Potassium: 4.6 mmol/L (ref 3.5–5.1)
Potassium: 3.8 mmol/L (ref 3.5–5.1)
Sodium: 139 mmol/L (ref 135–145)
Sodium: 141 mmol/L (ref 135–145)

## 2021-01-01 LAB — GLUCOSE, CAPILLARY: Glucose-Capillary: 109 mg/dL — ABNORMAL HIGH (ref 70–99)

## 2021-01-01 LAB — CBC
HCT: 38.9 % — ABNORMAL LOW (ref 39.0–52.0)
Hemoglobin: 12 g/dL — ABNORMAL LOW (ref 13.0–17.0)
MCH: 25.8 pg — ABNORMAL LOW (ref 26.0–34.0)
MCHC: 30.8 g/dL (ref 30.0–36.0)
MCV: 83.7 fL (ref 80.0–100.0)
Platelets: 340 10*3/uL (ref 150–400)
RBC: 4.65 MIL/uL (ref 4.22–5.81)
RDW: 15.8 % — ABNORMAL HIGH (ref 11.5–15.5)
WBC: 7.2 10*3/uL (ref 4.0–10.5)
nRBC: 0 % (ref 0.0–0.2)

## 2021-01-01 LAB — HEPARIN LEVEL (UNFRACTIONATED): Heparin Unfractionated: <0.1 IU/mL — ABNORMAL LOW (ref 0.30–0.70)

## 2021-01-01 LAB — MAGNESIUM
Magnesium: 2 mg/dL (ref 1.7–2.4)
Magnesium: 2 mg/dL (ref 1.7–2.4)

## 2021-01-01 MED ORDER — ENOXAPARIN SODIUM 40 MG/0.4ML IJ SOSY
40.0000 mg | PREFILLED_SYRINGE | INTRAMUSCULAR | Status: DC
  Filled 2021-01-01 (×2): qty 0.4

## 2021-01-01 MED ORDER — POTASSIUM CHLORIDE CRYS ER 20 MEQ PO TBCR
40.0000 meq | EXTENDED_RELEASE_TABLET | Freq: Once | ORAL | Status: AC
  Administered 2021-01-01: 40 meq via ORAL
  Filled 2021-01-01: qty 2

## 2021-01-01 NOTE — Progress Notes (Signed)
Family Medicine Teaching Service Daily Progress Note Intern Pager: [phone]  Patient name: [name] Medical record number: [id number] Date of birth: 08/21/1955 Age: 65 y.o. Gender: male  Primary Care Provider: [name], MD Consultants: Cardiology Code Status: Full  Pt Overview and Major Events to Date:  10/27: Admitted FPTS  Assessment and Plan: [name] is a 65 year old male who presented with abdominal pain, shortness of breath concerning for CHF exacerbation.  Past medical history significant for HFrEF, HTN, HLD, type 2 diabetes, is s/p bilateral BKA, CKD stage III, prior CVA, seizure disorder   Acute on Chronic HFrEF Stable.  VVS overnight. Remains on room air and oxygen saturations 93-94% overnight.  Weight down 4.4kg since admission.  24hr output 3.075L.  -3.334 net neg cumulative.  Tolerating diuresing with Lasix 120mg .  On exam does noyt appear to be volume overloaded -Cardiology followinga, appreciate recommendations -Continue IV Lasix 120 mg BID -Continue Imdur, Hydralazine, Entresto, and Bisoprolol per Cards -Consider SGLT2 and Aldactone when appropriate -Mag/BMP pm today -BMP/Mag daily -Strict I's&O's -Daily weights -OOB daily -PT/OT signed off  Elevated Troponins Stable. Now flat. No chest pain or recent ECG changes.  Heparin drip completed at midnight 10/30.   -Cardiology following, recommendations appreciated -Continue to monitor for chest pain  CKD Stage 3b GFR 50.  Cr 1.55 today, improved from yesterday.  Baseline 2 -Continue diuresing -Continue to monitor Cr -Avoid nephrotoxic agents  Type 2 DM A1c 6.2 on admission.   -Continue sSSI  -Continue to hold home mediations  Hypertension Normotensive -Continue home medications  Seizure like activity disorder No seizure activity -Continue current medications -Seizure precautions -Fall precautions -Delirium precautions   FEN/GI: Heart healthy PPx: Lovenox Dispo:SNF pending clinical  improvement . Barriers include none.   Subjective:  No acute events overnight.  Denies any chest pain, shortness of breath or palpitations.  Asking when can go home.  Making good urine output.  Objective: Temp:  [98.2 F (36.8 C)-98.5 F (36.9 C)] 98.2 F (36.8 C) (10/30 0444) Pulse Rate:  [58-72] 58 (10/30 0444) Resp:  [20] 20 (10/30 0444) BP: (109-127)/(72-94) 109/75 (10/30 0444) SpO2:  [90 %-94 %] 93 % (10/30 0444) Weight:  [88.9 kg] 88.9 kg (10/30 0000) Physical Exam: General: 65 y.o. male in NAD Cardio: RRR no m/r/g Lungs: CTAB, no wheezing, no rhonchi, no crackles, no IWOB on room air Abdomen: Soft, non-tender to palpation, non-distended, positive bowel sounds Skin: warm and dry Extremities: No edema   Laboratory: Recent Labs  Lab 12/29/20 1155 12/30/20 0122 12/31/20 0355  WBC 10.0 9.6 8.6  HGB 12.7* 11.6* 11.1*  HCT 40.8 37.9* 35.7*  PLT 365 341 304   Recent Labs  Lab 12/29/20 1155 12/30/20 0122 12/31/20 0355  NA 137 141 139  K 4.1 4.1 4.2  CL 104 106 103  CO2 24 28 29   BUN 30* 27* 27*  CREATININE 1.65* 1.64* 1.78*  CALCIUM 8.7* 8.6* 8.3*  PROT 6.3* 5.4* 5.0*  BILITOT 1.4* 1.3* 0.9  ALKPHOS 90 80 71  ALT 42 33 30  AST 34 27 22  GLUCOSE 133* 153* 131*     Imaging/Diagnostic Tests: No results found.   Carollee Leitz, MD 01/01/2021, 6:30 AM PGY-3, [place] Intern pager: [phone], text pages welcome

## 2021-01-01 NOTE — Progress Notes (Signed)
Progress Note  Patient Name: [name] Date of Encounter: 01/01/2021  Primary Cardiologist: Werner Lean, MD   Subjective    - 2.2 L with improvement in breathing.  Now able to lie completely flat.  No CP,, SOB, Orthopnea.  No abdominal pain.  Inpatient Medications    Scheduled Meds:  aspirin EC  81 mg Oral Daily   atorvastatin  40 mg Oral QHS   bisoprolol  5 mg Oral Daily   divalproex  125 mg Oral Q12H   hydrALAZINE  50 mg Oral Q8H   influenza vac split quadrivalent PF  0.5 mL Intramuscular Tomorrow-1000   isosorbide mononitrate  30 mg Oral Daily   levETIRAcetam  750 mg Oral BID   polyethylene glycol  17 g Oral BID   sacubitril-valsartan  1 tablet Oral BID   senna-docusate  1 tablet Oral QHS   sertraline  50 mg Oral q AM   tamsulosin  0.8 mg Oral QPC supper   Continuous Infusions:  sodium chloride 10 mL/hr at 12/30/20 0931   furosemide 120 mg (01/01/21 0743)   PRN Meds:    Vital Signs    Vitals:   12/31/20 1552 12/31/20 1948 01/01/21 0000 01/01/21 0444  BP: (!) 123/94 127/90  109/75  Pulse: 72 60  (!) 58  Resp: 20 20  20   Temp:  98.5 F (36.9 C)  98.2 F (36.8 C)  TempSrc:  Oral  Oral  SpO2:  94%  93%  Weight:   88.9 kg   Height:        Intake/Output Summary (Last 24 hours) at 01/01/2021 0836 Last data filed at 01/01/2021 0446 Gross per 24 hour  Intake 786.93 ml  Output 3075 ml  Net -2288.07 ml   Filed Weights   12/30/20 0054 12/31/20 0418 01/01/21 0000  Weight: 93.3 kg 91.3 kg 88.9 kg    Telemetry    Sinus rhythm 1st HB - Personally Reviewed  ECG    No new - Personally Reviewed  Physical Exam   GEN: No acute distress.   Neck: Improved supine JKD Cardiac: RRR, soft systolic murmur, no rubs, or gallops.  Respiratory: Decreased breath sounds in bases GI: Soft, nontender, distended with fluid wave  and dullness to percussion MS:  Bilateral amputation site c/d/i Neuro:  Nonfocal Psych: Normal affect   Labs     Chemistry Recent Labs  Lab 12/29/20 1155 12/30/20 0122 12/31/20 0355 01/01/21 0615  NA 137 141 139 139  K 4.1 4.1 4.2 4.6  CL 104 106 103 101  CO2 24 28 29  32  GLUCOSE 133* 153* 131* 108*  BUN 30* 27* 27* 23  CREATININE 1.65* 1.64* 1.78* 1.55*  CALCIUM 8.7* 8.6* 8.3* 8.3*  PROT 6.3* 5.4* 5.0*  --   ALBUMIN 3.1* 2.7* 2.4*  --   AST 34 27 22  --   ALT 42 33 30  --   ALKPHOS 90 80 71  --   BILITOT 1.4* 1.3* 0.9  --   GFRNONAA 46* 46* 42* 50*  ANIONGAP 9 7 7 6      Hematology Recent Labs  Lab 12/30/20 0122 12/31/20 0355 01/01/21 0615  WBC 9.6 8.6 7.2  RBC 4.49 4.26 4.65  HGB 11.6* 11.1* 12.0*  HCT 37.9* 35.7* 38.9*  MCV 84.4 83.8 83.7  MCH 25.8* 26.1 25.8*  MCHC 30.6 31.1 30.8  RDW 16.1* 15.9* 15.8*  PLT 341 304 340    Cardiac EnzymesNo results for input(s): TROPONINI in the last  168 hours. No results for input(s): TROPIPOC in the last 168 hours.   BNP Recent Labs  Lab 12/29/20 1207  BNP 2,475.4*     DDimer No results for input(s): DDIMER in the last 168 hours.   Radiology    No results found.    Patient Profile     65 y.o. male CHF, PAD, and blindness  Assessment & Plan     Heart Failure Reduced Ejection Fraction EF 20% - NYHA class IV, Stage C-D, hypervolemic, etiology from unclear  - Diuretic regimen: agree with IV lasix 120 BID - PM BMP and Mg for electrolyte repletion - bisoprolol 5 with no plans to increase until euvolemic -Entresto 24/26 - aldactone can be added if stable K and no significant AKI (improving creatinine, K is OK) - SGLT2i in consideration, we have not added only because we would need to better coordinate with PCP about DM control co-management, if ok with primary we would add this (sending price check today) - troponin elevation 1.2 appears to be demand related; if no sx, off heparin, ischemic eval not planned this admission  For questions or updates, please contact [place] Please consult [email or web address] for contact  info under Cardiology/STEMI.      Signed, Werner Lean, MD  01/01/2021, 8:36 AM

## 2021-01-02 ENCOUNTER — Other Ambulatory Visit (HOSPITAL_COMMUNITY): Payer: Self-pay

## 2021-01-02 DIAGNOSIS — I5023 Acute on chronic systolic (congestive) heart failure: Secondary | ICD-10-CM | POA: Diagnosis not present

## 2021-01-02 DIAGNOSIS — I509 Heart failure, unspecified: Secondary | ICD-10-CM | POA: Diagnosis not present

## 2021-01-02 LAB — CBC
HCT: 39.6 % (ref 39.0–52.0)
Hemoglobin: 12.2 g/dL — ABNORMAL LOW (ref 13.0–17.0)
MCH: 25.8 pg — ABNORMAL LOW (ref 26.0–34.0)
MCHC: 30.8 g/dL (ref 30.0–36.0)
MCV: 83.7 fL (ref 80.0–100.0)
Platelets: 356 10*3/uL (ref 150–400)
RBC: 4.73 MIL/uL (ref 4.22–5.81)
RDW: 15.5 % (ref 11.5–15.5)
WBC: 8 10*3/uL (ref 4.0–10.5)
nRBC: 0 % (ref 0.0–0.2)

## 2021-01-02 LAB — BASIC METABOLIC PANEL
Anion gap: 5 (ref 5–15)
BUN: 24 mg/dL — ABNORMAL HIGH (ref 8–23)
CO2: 31 mmol/L (ref 22–32)
Calcium: 8.3 mg/dL — ABNORMAL LOW (ref 8.9–10.3)
Chloride: 102 mmol/L (ref 98–111)
Creatinine, Ser: 1.65 mg/dL — ABNORMAL HIGH (ref 0.61–1.24)
GFR, Estimated: 46 mL/min — ABNORMAL LOW (ref >60)
Glucose, Bld: 156 mg/dL — ABNORMAL HIGH (ref 70–99)
Potassium: 4.1 mmol/L (ref 3.5–5.1)
Sodium: 138 mmol/L (ref 135–145)

## 2021-01-02 LAB — MAGNESIUM: Magnesium: 1.9 mg/dL (ref 1.7–2.4)

## 2021-01-02 MED ORDER — MAGNESIUM SULFATE 2 GM/50ML IV SOLN
2.0000 g | Freq: Once | INTRAVENOUS | Status: AC
  Administered 2021-01-02: 2 g via INTRAVENOUS
  Filled 2021-01-02: qty 50

## 2021-01-02 NOTE — Progress Notes (Signed)
FPTS Interim Progress Note  S:Patient seen and evaluated at bedside for nighttime rounds. He had been refusing his nighttime lasix because he did not like that it increased his urinary output. We explained that this is beneficial and a desired outcome to help him improve clinically. He reports diarrhea x1 this morning. No other complaints, and he is agreeable to receiving Lasix this evning.  O: BP 107/71 (BP Location: Right Arm)   Pulse 60   Temp 98.2 F (36.8 C) (Oral)   Resp 17   Ht 5\' 6"  (1.676 m)   Wt 88.9 kg   SpO2 92%   BMI 31.63 kg/m   General: Resting comfortably in bed  CV: RRR Resp: Normal WOB on room air  A/P: [name] is a 65 year old male with acute on chronic HFrEF exacerbation. - Continue plan per day team - Continue diuresis  Labs and orders reviewed. No new changes.  Orvis Brill, DO 01/02/2021, 1:16 AM PGY-1, [place] [facility] Service pager [phone]

## 2021-01-02 NOTE — Progress Notes (Signed)
Progress Note  Patient Name: [name] Date of Encounter: 01/02/2021  Primary Cardiologist: Werner Lean, MD   Subjective   Patient seen examined his bedside.  He was sitting up in bed when I arrived.  He offers no complaints.  He tells me that he has had a good night he was able to lay flat for the second night in a row and he is happy with his recovery.  Inpatient Medications    Scheduled Meds:  aspirin EC  81 mg Oral Daily   atorvastatin  40 mg Oral QHS   bisoprolol  5 mg Oral Daily   divalproex  125 mg Oral Q12H   enoxaparin (LOVENOX) injection  40 mg Subcutaneous Q24H   hydrALAZINE  50 mg Oral Q8H   influenza vac split quadrivalent PF  0.5 mL Intramuscular Tomorrow-1000   isosorbide mononitrate  30 mg Oral Daily   levETIRAcetam  750 mg Oral BID   polyethylene glycol  17 g Oral BID   sacubitril-valsartan  1 tablet Oral BID   senna-docusate  1 tablet Oral QHS   sertraline  50 mg Oral q AM   tamsulosin  0.8 mg Oral QPC supper   Continuous Infusions:  sodium chloride 10 mL/hr at 12/30/20 0931   furosemide 120 mg (01/02/21 0841)   PRN Meds:    Vital Signs    Vitals:   01/01/21 0444 01/01/21 0942 01/01/21 2013 01/02/21 0318  BP: 109/75 111/84 107/71 110/74  Pulse: (!) 58 69 60 (!) 57  Resp: 20  17 14   Temp: 98.2 F (36.8 C) 98.4 F (36.9 C) 98.2 F (36.8 C) 98.6 F (37 C)  TempSrc: Oral Oral Oral Oral  SpO2: 93% 97% 92% 94%  Weight:    87.8 kg  Height:        Intake/Output Summary (Last 24 hours) at 01/02/2021 0943 Last data filed at 01/02/2021 0700 Gross per 24 hour  Intake 550 ml  Output 2275 ml  Net -1725 ml   Filed Weights   12/31/20 0418 01/01/21 0000 01/02/21 0318  Weight: 91.3 kg 88.9 kg 87.8 kg    Telemetry    Telemetry review patient is sinus rhythm heart rate 60 bpm- Personally Reviewed  ECG    EKG on October 2020 to review shows sinus rhythm with LVH no EKG done since that time- Personally Reviewed  Physical Exam    GEN: No acute distress.   Neck: No JVD Cardiac: RRR, no murmurs, rubs, or gallops.  Respiratory: Clear to auscultation bilaterally. GI: Soft, nontender, non-distended  MS: Bilateral below-knee amputation Neuro:  Nonfocal  Psych: Normal affect   Labs    Chemistry Recent Labs  Lab 12/29/20 1155 12/30/20 0122 12/31/20 0355 01/01/21 0615 01/01/21 1302 01/02/21 0312  NA 137 141 139 139 141 138  K 4.1 4.1 4.2 4.6 3.8 4.1  CL 104 106 103 101 102 102  CO2 24 28 29  32 32 31  GLUCOSE 133* 153* 131* 108* 144* 156*  BUN 30* 27* 27* 23 23 24*  CREATININE 1.65* 1.64* 1.78* 1.55* 1.56* 1.65*  CALCIUM 8.7* 8.6* 8.3* 8.3* 8.4* 8.3*  PROT 6.3* 5.4* 5.0*  --   --   --   ALBUMIN 3.1* 2.7* 2.4*  --   --   --   AST 34 27 22  --   --   --   ALT 42 33 30  --   --   --   ALKPHOS 90 80 71  --   --   --  BILITOT 1.4* 1.3* 0.9  --   --   --   GFRNONAA 46* 46* 42* 50* 49* 46*  ANIONGAP 9 7 7 6 7 5      Hematology Recent Labs  Lab 12/31/20 0355 01/01/21 0615 01/02/21 0312  WBC 8.6 7.2 8.0  RBC 4.26 4.65 4.73  HGB 11.1* 12.0* 12.2*  HCT 35.7* 38.9* 39.6  MCV 83.8 83.7 83.7  MCH 26.1 25.8* 25.8*  MCHC 31.1 30.8 30.8  RDW 15.9* 15.8* 15.5  PLT 304 340 356    Cardiac EnzymesNo results for input(s): TROPONINI in the last 168 hours. No results for input(s): TROPIPOC in the last 168 hours.   BNP Recent Labs  Lab 12/29/20 1207  BNP 2,475.4*     DDimer No results for input(s): DDIMER in the last 168 hours.   Radiology    No results found.  Cardiac Studies   Echocardiogram done December 18, 2020 IMPRESSIONS     1. Left ventricular ejection fraction, by estimation, is 20 to 25%. The  left ventricle has severely decreased function. The left ventricle  demonstrates global hypokinesis. There is mild concentric left ventricular  hypertrophy. Left ventricular diastolic   parameters are consistent with Grade III diastolic dysfunction  (restrictive). Elevated left ventricular  end-diastolic pressure. The E/e'  is 29.6.   2. Right ventricular systolic function is moderately reduced. The right  ventricular size is severely enlarged. There is moderately elevated  pulmonary artery systolic pressure. The estimated right ventricular  systolic pressure is 23.7 mmHg.   3. Left atrial size was mildly dilated.   4. Right atrial size was mild to moderately dilated.   5. The mitral valve is grossly normal. Mild mitral valve regurgitation.  No evidence of mitral stenosis.   6. Tricuspid valve regurgitation is moderate to severe.   7. The aortic valve is tricuspid. Aortic valve regurgitation is not  visualized. No aortic stenosis is present.   8. The inferior vena cava is dilated in size with >50% respiratory  variability, suggesting right atrial pressure of 8 mmHg.   Comparison(s): Changes from prior study are noted. The left ventricular  function is significantly worse.   FINDINGS   Left Ventricle: Left ventricular ejection fraction, by estimation, is 20  to 25%. The left ventricle has severely decreased function. The left  ventricle demonstrates global hypokinesis. The left ventricular internal  cavity size was normal in size. There  is mild concentric left ventricular hypertrophy. Left ventricular  diastolic parameters are consistent with Grade III diastolic dysfunction  (restrictive). Elevated left ventricular end-diastolic pressure. The E/e'  is 29.6.   Right Ventricle: The right ventricular size is severely enlarged. No  increase in right ventricular wall thickness. Right ventricular systolic  function is moderately reduced. There is moderately elevated pulmonary  artery systolic pressure. The tricuspid  regurgitant velocity is 3.41 m/s, and with an assumed right atrial  pressure of 8 mmHg, the estimated right ventricular systolic pressure is  62.8 mmHg.   Left Atrium: Left atrial size was mildly dilated.   Right Atrium: Right atrial size was mild to  moderately dilated.   Pericardium: Trivial pericardial effusion is present.   Mitral Valve: The mitral valve is grossly normal. Mild mitral valve  regurgitation. No evidence of mitral valve stenosis. MV peak gradient, 4.1  mmHg. The mean mitral valve gradient is 1.0 mmHg.   Tricuspid Valve: The tricuspid valve is grossly normal. Tricuspid valve  regurgitation is moderate to severe. No evidence of tricuspid stenosis.  Aortic Valve: The aortic valve is tricuspid. Aortic valve regurgitation is  not visualized. No aortic stenosis is present. Aortic valve mean gradient  measures 2.0 mmHg. Aortic valve peak gradient measures 3.1 mmHg. Aortic  valve area, by VTI measures 2.41  cm.   Pulmonic Valve: The pulmonic valve was grossly normal. Pulmonic valve  regurgitation is trivial. No evidence of pulmonic stenosis.   Aorta: The aortic root and ascending aorta are structurally normal, with  no evidence of dilitation.   Venous: The inferior vena cava is dilated in size with greater than 50%  respiratory variability, suggesting right atrial pressure of 8 mmHg.   IAS/Shunts: The atrial septum is grossly normal. IMPRESSIONS     1. Left ventricular ejection fraction, by estimation, is 20 to 25%. The  left ventricle has severely decreased function. The left ventricle  demonstrates global hypokinesis. There is mild concentric left ventricular  hypertrophy. Left ventricular diastolic   parameters are consistent with Grade III diastolic dysfunction  (restrictive). Elevated left ventricular end-diastolic pressure. The E/e'  is 29.6.   2. Right ventricular systolic function is moderately reduced. The right  ventricular size is severely enlarged. There is moderately elevated  pulmonary artery systolic pressure. The estimated right ventricular  systolic pressure is 83.1 mmHg.   3. Left atrial size was mildly dilated.   4. Right atrial size was mild to moderately dilated.   5. The mitral valve is  grossly normal. Mild mitral valve regurgitation.  No evidence of mitral stenosis.   6. Tricuspid valve regurgitation is moderate to severe.   7. The aortic valve is tricuspid. Aortic valve regurgitation is not  visualized. No aortic stenosis is present.   8. The inferior vena cava is dilated in size with >50% respiratory  variability, suggesting right atrial pressure of 8 mmHg.   Comparison(s): Changes from prior study are noted. The left ventricular  function is significantly worse.   FINDINGS   Left Ventricle: Left ventricular ejection fraction, by estimation, is 20  to 25%. The left ventricle has severely decreased function. The left  ventricle demonstrates global hypokinesis. The left ventricular internal  cavity size was normal in size. There  is mild concentric left ventricular hypertrophy. Left ventricular  diastolic parameters are consistent with Grade III diastolic dysfunction  (restrictive). Elevated left ventricular end-diastolic pressure. The E/e'  is 29.6.   Right Ventricle: The right ventricular size is severely enlarged. No  increase in right ventricular wall thickness. Right ventricular systolic  function is moderately reduced. There is moderately elevated pulmonary  artery systolic pressure. The tricuspid  regurgitant velocity is 3.41 m/s, and with an assumed right atrial  pressure of 8 mmHg, the estimated right ventricular systolic pressure is  51.7 mmHg.   Left Atrium: Left atrial size was mildly dilated.   Right Atrium: Right atrial size was mild to moderately dilated.   Pericardium: Trivial pericardial effusion is present.   Mitral Valve: The mitral valve is grossly normal. Mild mitral valve  regurgitation. No evidence of mitral valve stenosis. MV peak gradient, 4.1  mmHg. The mean mitral valve gradient is 1.0 mmHg.   Tricuspid Valve: The tricuspid valve is grossly normal. Tricuspid valve  regurgitation is moderate to severe. No evidence of tricuspid  stenosis.   Aortic Valve: The aortic valve is tricuspid. Aortic valve regurgitation is  not visualized. No aortic stenosis is present. Aortic valve mean gradient  measures 2.0 mmHg. Aortic valve peak gradient measures 3.1 mmHg. Aortic  valve area, by VTI measures  2.41  cm.   Pulmonic Valve: The pulmonic valve was grossly normal. Pulmonic valve  regurgitation is trivial. No evidence of pulmonic stenosis.   Aorta: The aortic root and ascending aorta are structurally normal, with  no evidence of dilitation.   Venous: The inferior vena cava is dilated in size with greater than 50%  respiratory variability, suggesting right atrial pressure of 8 mmHg.   IAS/Shunts: The atrial septum is grossly normal.   Patient Profile     65 y.o. male heart failure with reduced ejection fraction EF 20%, PAD status post hypertension,  blindness  Assessment & Plan    Heart failure with reduced ejection fraction-most recent EF 20% -  He is noted to be NYHA class IV, stage C/D heart failure.  Unclear etiology likely nonischemic  He is currently being diuresed and seems to be improving we will continue patient on IV diuretics 1 more day his creatinine has bumped up to 1.65 compared to yesterday but it appears that this is close to his baseline.  Output -1725 ml over the last 24 hours he could be transition to p.o. diuretics by tomorrow.  Continue patient on Entresto 24/26 mg twice daily, bisoprolol 5 mg daily.  Also his potassium is stable however his blood pressure is marginal we will hold off on adding the spironolactone for now.  Plans to add SGLT2 inhibitors in the outpatient setting. He will need to follow-up with cardiology and consideration for an ischemic evaluation in the outpatient setting.  For questions or updates, please contact [place] Please consult [email or web address] for contact info under Cardiology/STEMI.      Rolly Pancake, DO  01/02/2021, 9:43 AM

## 2021-01-02 NOTE — Progress Notes (Signed)
Pt remained stable throughout the shift. Compliant with all treatments and medication and resting comfortably.

## 2021-01-02 NOTE — TOC Benefit Eligibility Note (Signed)
Patient Teacher, English as a foreign language completed.    The patient is currently admitted and upon discharge could be taking Jardiance 10 mg.  The current 30 day co-pay is, $0.00.    The patient is currently admitted and upon discharge could be taking Farxiga 10 mg.  The current 30 day co-pay is, $0.00.   The patient is insured through [place], [employer] Patient Advocate Specialist [place] Patient Advocate Team Direct Number: [phone]  Fax: [phone]

## 2021-01-02 NOTE — Progress Notes (Signed)
Family Medicine Teaching Service Daily Progress Note Intern Pager: [phone]  Patient name: [name] Medical record number: [id number] Date of birth: 07/13/1955 Age: 65 y.o. Gender: male  Primary Care Provider: [name], MD Consultants: Cardiology Code Status: Full  Pt Overview and Major Events to Date:  12/29/20-Admitted   Assessment and Plan: [name] is a 65 year old male who presented with abdominal pain, dyspnea concerning for CHF exacerbation.  Past medical history significant for HFrEF, HTN, HLD, type 2 diabetes, is s/p bilateral BKA, CKD stage III, prior CVA, seizure disorder  Acute on Chronic HFrEF Ejection fraction of 20%.  Patient is currently getting Lasix 120 mg IV twice daily.  24-hour urinary output of 1.97 L. Pt initially refused pm dose of lasix but agreed to the dose around midnight last night. Magnesium 1.9 with goal of 2.  -Cardiology following, appreciate recommendations -Continue IV Lasix 120 mg twice daily -Continue home Imdur, hydralazine, Entresto, bisoprolol -SGLT2 at discharge -Consider adding Aldactone when/if appropriate  -Mg 2 g IV once today -Mag/BMP daily -Strict I/os -Daily weights -OOB daily  CKD stage IIIb GFR 46, creatinine 1.65 today.  Baseline around 1.5 -continue diuresing -daily BMP -Avoid nephrotoxic agents  Type 2 diabetes A1c 6.2 on admission -Continue SSI -Continue hold home medications  Hypertension Normotensive -Continue home medications  Seizure disorder -Continue home medications -Seizure precautions -Fall precautions -Delirium precautions    FEN/GI: Heart healthy PPx: Lovenox Dispo:SNF pending clinical improvement . Barriers include None.   Subjective:  Patient denies any shortness of breath, chest pain, abdominal pain today.  States he did not sleep well last night.  States he feels well this morning.  Was able to eat breakfast.  Objective: Temp:  [98.2 F (36.8 C)-98.6 F (37 C)] 98.6 F (37  C) (10/31 0318) Pulse Rate:  [57-69] 57 (10/31 0318) Resp:  [14-17] 14 (10/31 0318) BP: (107-111)/(71-84) 110/74 (10/31 0318) SpO2:  [92 %-97 %] 94 % (10/31 0318) Weight:  [87.8 kg] 87.8 kg (10/31 0318) Physical Exam: General: Sitting up in bed, NAD Cardiovascular: RRR, normal S1/S2 Respiratory: No wheezing, crackles.  Normal work of breathing on room air.  Decreased breath sounds at lung bases bilaterally Extremities: s/p bilateral BKA  Laboratory: Recent Labs  Lab 12/31/20 0355 01/01/21 0615 01/02/21 0312  WBC 8.6 7.2 8.0  HGB 11.1* 12.0* 12.2*  HCT 35.7* 38.9* 39.6  PLT 304 340 356   Recent Labs  Lab 12/29/20 1155 12/30/20 0122 12/31/20 0355 01/01/21 0615 01/01/21 1302 01/02/21 0312  NA 137 141 139 139 141 138  K 4.1 4.1 4.2 4.6 3.8 4.1  CL 104 106 103 101 102 102  CO2 24 28 29  32 32 31  BUN 30* 27* 27* 23 23 24*  CREATININE 1.65* 1.64* 1.78* 1.55* 1.56* 1.65*  CALCIUM 8.7* 8.6* 8.3* 8.3* 8.4* 8.3*  PROT 6.3* 5.4* 5.0*  --   --   --   BILITOT 1.4* 1.3* 0.9  --   --   --   ALKPHOS 90 80 71  --   --   --   ALT 42 33 30  --   --   --   AST 34 27 22  --   --   --   GLUCOSE 133* 153* 131* 108* 144* 156[name], DO 01/02/2021, 7:32 AM PGY-1, [place] Intern pager: [phone], text pages welcome

## 2021-01-03 ENCOUNTER — Ambulatory Visit (HOSPITAL_COMMUNITY): Payer: Medicaid Other

## 2021-01-03 DIAGNOSIS — I509 Heart failure, unspecified: Secondary | ICD-10-CM | POA: Diagnosis not present

## 2021-01-03 LAB — BASIC METABOLIC PANEL
Anion gap: 7 (ref 5–15)
BUN: 23 mg/dL (ref 8–23)
CO2: 29 mmol/L (ref 22–32)
Calcium: 8.5 mg/dL — ABNORMAL LOW (ref 8.9–10.3)
Chloride: 102 mmol/L (ref 98–111)
Creatinine, Ser: 1.64 mg/dL — ABNORMAL HIGH (ref 0.61–1.24)
GFR, Estimated: 46 mL/min — ABNORMAL LOW (ref >60)
Glucose, Bld: 118 mg/dL — ABNORMAL HIGH (ref 70–99)
Potassium: 4.5 mmol/L (ref 3.5–5.1)
Sodium: 138 mmol/L (ref 135–145)

## 2021-01-03 LAB — MAGNESIUM: Magnesium: 2.3 mg/dL (ref 1.7–2.4)

## 2021-01-03 MED ORDER — DORZOLAMIDE HCL-TIMOLOL MAL 2-0.5 % OP SOLN
1.0000 [drp] | Freq: Two times a day (BID) | OPHTHALMIC | Status: DC
  Filled 2021-01-03: qty 10

## 2021-01-03 MED ORDER — FUROSEMIDE 40 MG PO TABS
80.0000 mg | ORAL_TABLET | Freq: Two times a day (BID) | ORAL | Status: DC
  Administered 2021-01-03 – 2021-01-04 (×2): 80 mg via ORAL
  Filled 2021-01-03 (×2): qty 2

## 2021-01-03 NOTE — Progress Notes (Addendum)
FPTS Interim Progress Note  S:Patient seen and evaluated at bedside on nighttime rounds with Dr. Susa Simmonds.He appeared to be resting comfortably, but had mits placed on both hands that FPTS was unaware of. Spoke with his primary night RN who voiced that Mr. Roycroft was "pulling at his IV and condom catheter" and "unwrapped the IV to try to take it out" earlier in the evening. He did receive all scheduled Lasix today, with last dose at 1730. Patient denied any complaints or concerns.  O: BP 109/77 (BP Location: Right Arm)   Pulse (!) 56   Temp 97.8 F (36.6 C) (Oral)   Resp 20   Ht 5\' 6"  (1.676 m)   Wt 87.8 kg   SpO2 91%   BMI 31.24 kg/m   General: Sleepy, alert, no distress, no agitation and answers questions appropriately with head nods. Mits placed on bilateral hands Resp: Normal chest rise and fall Skin: Warm, dry, well-perfused   A/P: Acute on Chronic HFrEF Patient had just awoken and appeared comfortable and pleasant, and he took his nighttime medications without difficulty while myself and Dr. Susa Simmonds were in the room with patient's RN. No signs of agitation noted. As he received last Lasix dose >6 hours ago, it would not be an issue if he lost condom catheter. - Continue IV diuresis tomorrow, and other current  management per day team - Mits removed - RN told to page [place] intern pager with any concerns for  patient agitation/compliance  Labs and orders reviewed. No changes for now.  Orvis Brill, DO 01/03/2021, 12:05 AM PGY-1, [place] Medicine Service pager [phone]

## 2021-01-03 NOTE — Progress Notes (Signed)
FPTS Interim Progress Note  S: Was messaged about BP reading 88/66 and HR 58 and went and saw patient. Denies feeling lightheaded.   O: BP 104/74 (BP Location: Right Arm)   Pulse (!) 57   Temp 98.6 F (37 C) (Oral)   Resp 19   Ht 5\' 6"  (1.676 m)   Wt 87.6 kg   SpO2 92%   BMI 31.17 kg/m    Gen: Non-toxic, responsive, fully alert and oriented Resp: Breathing comfortably on RA  A/P: -Hydralazine dose held -Manual BP to be taken -Will monitor routine vitals/clinically  Gerrit Heck, MD 01/03/2021, 2:19 PM PGY-1, [place] Medicine Service pager [phone]

## 2021-01-03 NOTE — Progress Notes (Signed)
Progress Note  Patient Name: [name] Date of Encounter: 01/03/2021  Primary Cardiologist: Werner Lean, MD   Subjective   Patient seen in his bedside.  He was sitting up in bed when I arrived.  No complaints.  Inpatient Medications    Scheduled Meds:  aspirin EC  81 mg Oral Daily   atorvastatin  40 mg Oral QHS   bisoprolol  5 mg Oral Daily   divalproex  125 mg Oral Q12H   dorzolamide-timolol  1 drop Left Eye BID   enoxaparin (LOVENOX) injection  40 mg Subcutaneous Q24H   hydrALAZINE  50 mg Oral Q8H   influenza vac split quadrivalent PF  0.5 mL Intramuscular Tomorrow-1000   isosorbide mononitrate  30 mg Oral Daily   levETIRAcetam  750 mg Oral BID   polyethylene glycol  17 g Oral BID   sacubitril-valsartan  1 tablet Oral BID   senna-docusate  1 tablet Oral QHS   sertraline  50 mg Oral q AM   tamsulosin  0.8 mg Oral QPC supper   Continuous Infusions:  sodium chloride 10 mL/hr at 12/30/20 0931   furosemide 120 mg (01/03/21 0835)   PRN Meds:    Vital Signs    Vitals:   01/02/21 2052 01/03/21 0009 01/03/21 0504 01/03/21 0506  BP: 109/77 122/77  105/73  Pulse: (!) 56 (!) 57  (!) 56  Resp: 20 20  18   Temp: 97.8 F (36.6 C) 97.9 F (36.6 C)  97.6 F (36.4 C)  TempSrc: Oral Oral  Oral  SpO2: 91% 94%  97%  Weight:   87.6 kg   Height:        Intake/Output Summary (Last 24 hours) at 01/03/2021 1052 Last data filed at 01/03/2021 0835 Gross per 24 hour  Intake 720 ml  Output 1000 ml  Net -280 ml   Filed Weights   01/01/21 0000 01/02/21 0318 01/03/21 0504  Weight: 88.9 kg 87.8 kg 87.6 kg    Telemetry    Sinus bradycardia, heart rate 58 bpm- Personally Reviewed  ECG    None today- Personally Reviewed  Physical Exam   GEN: No acute distress.   Neck: No JVD Cardiac: RRR, no murmurs, rubs, or gallops.  Respiratory: Clear to auscultation bilaterally. GI: Soft, nontender, non-distended  MS: Bilateral below-knee amputation Neuro:  Nonfocal   Psych: Normal affect   Labs    Chemistry Recent Labs  Lab 12/29/20 1155 12/30/20 0122 12/31/20 0355 01/01/21 0615 01/01/21 1302 01/02/21 0312 01/03/21 0420  NA 137 141 139   < > 141 138 138  K 4.1 4.1 4.2   < > 3.8 4.1 4.5  CL 104 106 103   < > 102 102 102  CO2 24 28 29    < > 32 31 29  GLUCOSE 133* 153* 131*   < > 144* 156* 118*  BUN 30* 27* 27*   < > 23 24* 23  CREATININE 1.65* 1.64* 1.78*   < > 1.56* 1.65* 1.64*  CALCIUM 8.7* 8.6* 8.3*   < > 8.4* 8.3* 8.5*  PROT 6.3* 5.4* 5.0*  --   --   --   --   ALBUMIN 3.1* 2.7* 2.4*  --   --   --   --   AST 34 27 22  --   --   --   --   ALT 42 33 30  --   --   --   --   ALKPHOS 90 80 71  --   --   --   --  BILITOT 1.4* 1.3* 0.9  --   --   --   --   GFRNONAA 46* 46* 42*   < > 49* 46* 46*  ANIONGAP 9 7 7    < > 7 5 7    < > = values in this interval not displayed.     Hematology Recent Labs  Lab 12/31/20 0355 01/01/21 0615 01/02/21 0312  WBC 8.6 7.2 8.0  RBC 4.26 4.65 4.73  HGB 11.1* 12.0* 12.2*  HCT 35.7* 38.9* 39.6  MCV 83.8 83.7 83.7  MCH 26.1 25.8* 25.8*  MCHC 31.1 30.8 30.8  RDW 15.9* 15.8* 15.5  PLT 304 340 356    Cardiac EnzymesNo results for input(s): TROPONINI in the last 168 hours. No results for input(s): TROPIPOC in the last 168 hours.   BNP Recent Labs  Lab 12/29/20 1207  BNP 2,475.4*     DDimer No results for input(s): DDIMER in the last 168 hours.   Radiology    No results found.  Cardiac Studies   Echocardiogram done December 18, 2020 IMPRESSIONS   1. Left ventricular ejection fraction, by estimation, is 20 to 25%. The  left ventricle has severely decreased function. The left ventricle  demonstrates global hypokinesis. There is mild concentric left ventricular  hypertrophy. Left ventricular diastolic   parameters are consistent with Grade III diastolic dysfunction  (restrictive). Elevated left ventricular end-diastolic pressure. The E/e'  is 29.6.   2. Right ventricular systolic function  is moderately reduced. The right  ventricular size is severely enlarged. There is moderately elevated  pulmonary artery systolic pressure. The estimated right ventricular  systolic pressure is 98.3 mmHg.   3. Left atrial size was mildly dilated.   4. Right atrial size was mild to moderately dilated.   5. The mitral valve is grossly normal. Mild mitral valve regurgitation.  No evidence of mitral stenosis.   6. Tricuspid valve regurgitation is moderate to severe.   7. The aortic valve is tricuspid. Aortic valve regurgitation is not  visualized. No aortic stenosis is present.   8. The inferior vena cava is dilated in size with >50% respiratory  variability, suggesting right atrial pressure of 8 mmHg.   Comparison(s): Changes from prior study are noted. The left ventricular  function is significantly worse.   FINDINGS   Left Ventricle: Left ventricular ejection fraction, by estimation, is 20  to 25%. The left ventricle has severely decreased function. The left  ventricle demonstrates global hypokinesis. The left ventricular internal  cavity size was normal in size. There  is mild concentric left ventricular hypertrophy. Left ventricular  diastolic parameters are consistent with Grade III diastolic dysfunction  (restrictive). Elevated left ventricular end-diastolic pressure. The E/e'  is 29.6.   Right Ventricle: The right ventricular size is severely enlarged. No  increase in right ventricular wall thickness. Right ventricular systolic  function is moderately reduced. There is moderately elevated pulmonary  artery systolic pressure. The tricuspid  regurgitant velocity is 3.41 m/s, and with an assumed right atrial  pressure of 8 mmHg, the estimated right ventricular systolic pressure is  38.2 mmHg.   Left Atrium: Left atrial size was mildly dilated.   Right Atrium: Right atrial size was mild to moderately dilated.   Pericardium: Trivial pericardial effusion is present.   Mitral  Valve: The mitral valve is grossly normal. Mild mitral valve  regurgitation. No evidence of mitral valve stenosis. MV peak gradient, 4.1  mmHg. The mean mitral valve gradient is 1.0 mmHg.   Tricuspid Valve:  The tricuspid valve is grossly normal. Tricuspid valve  regurgitation is moderate to severe. No evidence of tricuspid stenosis.   Aortic Valve: The aortic valve is tricuspid. Aortic valve regurgitation is  not visualized. No aortic stenosis is present. Aortic valve mean gradient  measures 2.0 mmHg. Aortic valve peak gradient measures 3.1 mmHg. Aortic  valve area, by VTI measures 2.41  cm.   Pulmonic Valve: The pulmonic valve was grossly normal. Pulmonic valve  regurgitation is trivial. No evidence of pulmonic stenosis.   Aorta: The aortic root and ascending aorta are structurally normal, with  no evidence of dilitation.   Venous: The inferior vena cava is dilated in size with greater than 50%  respiratory variability, suggesting right atrial pressure of 8 mmHg.   IAS/Shunts: The atrial septum is grossly normal. IMPRESSIONS     1. Left ventricular ejection fraction, by estimation, is 20 to 25%. The  left ventricle has severely decreased function. The left ventricle  demonstrates global hypokinesis. There is mild concentric left ventricular  hypertrophy. Left ventricular diastolic   parameters are consistent with Grade III diastolic dysfunction  (restrictive). Elevated left ventricular end-diastolic pressure. The E/e'  is 29.6.   2. Right ventricular systolic function is moderately reduced. The right  ventricular size is severely enlarged. There is moderately elevated  pulmonary artery systolic pressure. The estimated right ventricular  systolic pressure is 63.8 mmHg.   3. Left atrial size was mildly dilated.   4. Right atrial size was mild to moderately dilated.   5. The mitral valve is grossly normal. Mild mitral valve regurgitation.  No evidence of mitral stenosis.   6.  Tricuspid valve regurgitation is moderate to severe.   7. The aortic valve is tricuspid. Aortic valve regurgitation is not  visualized. No aortic stenosis is present.   8. The inferior vena cava is dilated in size with >50% respiratory  variability, suggesting right atrial pressure of 8 mmHg.   Comparison(s): Changes from prior study are noted. The left ventricular  function is significantly worse.   FINDINGS   Left Ventricle: Left ventricular ejection fraction, by estimation, is 20  to 25%. The left ventricle has severely decreased function. The left  ventricle demonstrates global hypokinesis. The left ventricular internal  cavity size was normal in size. There  is mild concentric left ventricular hypertrophy. Left ventricular  diastolic parameters are consistent with Grade III diastolic dysfunction  (restrictive). Elevated left ventricular end-diastolic pressure. The E/e'  is 29.6.   Right Ventricle: The right ventricular size is severely enlarged. No  increase in right ventricular wall thickness. Right ventricular systolic  function is moderately reduced. There is moderately elevated pulmonary  artery systolic pressure. The tricuspid  regurgitant velocity is 3.41 m/s, and with an assumed right atrial  pressure of 8 mmHg, the estimated right ventricular systolic pressure is  75.6 mmHg.   Left Atrium: Left atrial size was mildly dilated.   Right Atrium: Right atrial size was mild to moderately dilated.   Pericardium: Trivial pericardial effusion is present.   Mitral Valve: The mitral valve is grossly normal. Mild mitral valve  regurgitation. No evidence of mitral valve stenosis. MV peak gradient, 4.1  mmHg. The mean mitral valve gradient is 1.0 mmHg.   Tricuspid Valve: The tricuspid valve is grossly normal. Tricuspid valve  regurgitation is moderate to severe. No evidence of tricuspid stenosis.   Aortic Valve: The aortic valve is tricuspid. Aortic valve regurgitation is  not  visualized. No aortic stenosis is present. Aortic  valve mean gradient  measures 2.0 mmHg. Aortic valve peak gradient measures 3.1 mmHg. Aortic  valve area, by VTI measures 2.41  cm.   Pulmonic Valve: The pulmonic valve was grossly normal. Pulmonic valve  regurgitation is trivial. No evidence of pulmonic stenosis.   Aorta: The aortic root and ascending aorta are structurally normal, with  no evidence of dilitation.   Venous: The inferior vena cava is dilated in size with greater than 50%  respiratory variability, suggesting right atrial pressure of 8 mmHg.   IAS/Shunts: The atrial septum is grossly normal.    Patient Profile     65 y.o. male heart failure with reduced ejection fraction EF 20%, PAD status post hypertension, blindness.   Assessment & Plan    Heart failure with reduced ejection fraction-most recent EF 20% - He is noted to be NYHA class IV, stage C/D heart failure.  Unclear etiology likely nonischemic.  Creatinine remains at baseline.  Output -842 ml over the last 24 hours he could be transition to p.o. diuretics today.  Lasix 80 mg twice daily.   Continue patient on Entresto 24/26 mg twice daily, bisoprolol 5 mg daily.  Also his potassium is stable however his blood pressure is marginal we will hold off on adding the spironolactone for now.   Plans to add SGLT2 inhibitors in the outpatient setting. He will need to follow-up with cardiology and consideration for an ischemic evaluation in the outpatient setting.   For questions or updates, please contact [place] Please consult [email or web address] for contact info under Cardiology/STEMI.      Signed, Berniece Salines, DO  01/03/2021, 10:52 AM

## 2021-01-03 NOTE — Progress Notes (Signed)
Family Medicine Teaching Service Daily Progress Note Intern Pager: [phone]  Patient name: [name] Medical record number: [id number] Date of birth: 1955-09-30 Age: 65 y.o. Gender: male  Primary Care Provider: [name], MD Consultants: Cardiology Code Status: DNR   Pt Overview and Major Events to Date:  12/29/20 Admitted  Assessment and Plan: [name] is a 65 yo male who was admitted for abdominal pain, dyspnea concerning for CHF exacerbation.  PMH is significant for HFrEF, HTN, type 2 diabetes, s/p bilateral BKA, CKD stage III, prior CVA, seizure disorder  Acute on chronic HFrEF Says his SOB was improved today. Lungs clear to auscultation on exam today. Output 1 L in last 24 hours and down 5.3 L since admission. Weight today is similar to yesterday at 193 pounds. Magnesium 2.3 today with goal of 2.  Potassium 4.5 goal over 4. -Cardiology team following, appreciate assistance -Transitioned to lasix 80 mg oral twice daily -Continue home imdur, hydralazine, entresto, bisoprolol -Mag/BMP daily -Strict I's and O's -Daily weights -Out of bed daily -SGLT2 at discharge   CKD stage IIIb GFR 46 same as yesterday and creatinine 1.64 from 1.65 yesterday similar. -Continue diuresing -Daily BMP  Type 2 diabetes A1c 6.2 on admission. Glucose appropriate 118 -Continue holding home medications  Hypertension Blood pressures have been normotensive. -Continue home medications  Seizure disorder Stable -Continue home medications of Depakote 125 twice daily -Seizure precautions, fall precautions, delirium precautions  FEN/GI: Heart healthy/carb modified PPx: Lovenox 40 mg Dispo:SNF pending clinical improvement . Barriers include transition to oral medications  Subjective:  No complaints this AM, denies any abdominal or chest pain. Says he is eating well.  Objective: Temp:  [97.6 F (36.4 C)-97.9 F (36.6 C)] 97.6 F (36.4 C) (11/01 0506) Pulse Rate:  [55-57] 56 (11/01  0506) Resp:  [16-20] 18 (11/01 0506) BP: (97-122)/(65-77) 105/73 (11/01 0506) SpO2:  [91 %-98 %] 97 % (11/01 0506) Weight:  [87.6 kg] 87.6 kg (11/01 0504) Physical Exam: General: Non toxic Cardiovascular: RRR no m/r/g Respiratory: CTAB no w/r/c spoke full sentences, no iWOB Abdomen: Nontender, nondistended Extremities: Bilateral BKA  Laboratory: Recent Labs  Lab 12/31/20 0355 01/01/21 0615 01/02/21 0312  WBC 8.6 7.2 8.0  HGB 11.1* 12.0* 12.2*  HCT 35.7* 38.9* 39.6  PLT 304 340 356   Recent Labs  Lab 12/29/20 1155 12/30/20 0122 12/31/20 0355 01/01/21 0615 01/01/21 1302 01/02/21 0312 01/03/21 0420  NA 137 141 139   < > 141 138 138  K 4.1 4.1 4.2   < > 3.8 4.1 4.5  CL 104 106 103   < > 102 102 102  CO2 24 28 29    < > 32 31 29  BUN 30* 27* 27*   < > 23 24* 23  CREATININE 1.65* 1.64* 1.78*   < > 1.56* 1.65* 1.64*  CALCIUM 8.7* 8.6* 8.3*   < > 8.4* 8.3* 8.5*  PROT 6.3* 5.4* 5.0*  --   --   --   --   BILITOT 1.4* 1.3* 0.9  --   --   --   --   ALKPHOS 90 80 71  --   --   --   --   ALT 42 33 30  --   --   --   --   AST 34 27 22  --   --   --   --   GLUCOSE 133* 153* 131*   < > 144* 156* 118*   < > = values  in this interval not displayed.    Imaging/Diagnostic Tests:  Gerrit Heck, MD 01/03/2021, 9:32 AM PGY-1, [place] Intern pager: [phone], text pages welcome

## 2021-01-04 DIAGNOSIS — I509 Heart failure, unspecified: Secondary | ICD-10-CM | POA: Diagnosis not present

## 2021-01-04 LAB — BASIC METABOLIC PANEL
Anion gap: 7 (ref 5–15)
BUN: 25 mg/dL — ABNORMAL HIGH (ref 8–23)
CO2: 28 mmol/L (ref 22–32)
Calcium: 8.5 mg/dL — ABNORMAL LOW (ref 8.9–10.3)
Chloride: 102 mmol/L (ref 98–111)
Creatinine, Ser: 1.75 mg/dL — ABNORMAL HIGH (ref 0.61–1.24)
GFR, Estimated: 43 mL/min — ABNORMAL LOW (ref >60)
Glucose, Bld: 112 mg/dL — ABNORMAL HIGH (ref 70–99)
Potassium: 4.7 mmol/L (ref 3.5–5.1)
Sodium: 137 mmol/L (ref 135–145)

## 2021-01-04 LAB — MAGNESIUM: Magnesium: 2.3 mg/dL (ref 1.7–2.4)

## 2021-01-04 MED ORDER — HYDRALAZINE HCL 50 MG PO TABS: 50.0000 mg | ORAL_TABLET | Freq: Three times a day (TID) | ORAL | Status: DC

## 2021-01-04 MED ORDER — HYDRALAZINE HCL 25 MG PO TABS: 25.0000 mg | ORAL_TABLET | Freq: Three times a day (TID) | ORAL | Status: DC

## 2021-01-04 MED ORDER — FUROSEMIDE 40 MG PO TABS
40.0000 mg | ORAL_TABLET | Freq: Two times a day (BID) | ORAL | Status: DC
  Administered 2021-01-04 – 2021-01-05 (×3): 40 mg via ORAL
  Filled 2021-01-04 (×3): qty 1

## 2021-01-04 NOTE — Progress Notes (Signed)
Family Medicine Teaching Service Daily Progress Note Intern Pager: [phone]  Patient name: [name] Medical record number: [id number] Date of birth: 1956/01/21 Age: 65 y.o. Gender: male  Primary Care Provider: [name], MD Consultants: Cardiology  Code Status: DNR  Pt Overview and Major Events to Date:  10/27 Admitted  Assessment and Plan:  [name] is 65 yo male admitted for abdominal pain and dyspnea concerning for HF exacerbation. PMH significant for HFrEF, HTN, T2DM, BKA, CKD stage III, prior CVA, seizure disorder  Acute on Chronic HFrEF No SOB today. Lungs on exam today was clear. Output was 2.25 over the last 24 hours.  Down net 6.8 L.  Weight today was 192 from 193 yesterday.  Magnesium was 2.3 today with goal every 2.  Potassium was 4.7 today. -Cardiology team signed off today, appreciated recs recommended Lasix 40 mg oral twice daily, entresto 24-26 bid, bisoprolo 5 mg daily and stopping hydralazine will need outpatient cardiology f/u -Strict I's and O's -Daily weights -Out of bed daily -Mag/BMP daily -SGLT2 at discharge  CKD stage IIIb GFR 43 from 46 yesterday.  Creatinine is bumped up to 1.75 from 1.64 yesterday. -Continue diuresing -Daily BMP  Type 2 diabetes A1c 6.2 on admission, glucose appropriate at 112.  Plan-continue holding home medications  Hypertension  Blood pressures have been mostly soft, with 1 mildly elevated blood pressure of 132/93. -Continue home medications  Seizure disorder Stable -Continue home medications Depakote 125 mg twice daily -Seizure precautions, fall precautions  FEN/GI: Heart healthy carb modified PPx: Lovenox 40 mg has been denying Dispo: SNF stable  Subjective:  No issues today  Objective: Temp:  [98.1 F (36.7 C)-98.6 F (37 C)] 98.1 F (36.7 C) (11/01 2020) Pulse Rate:  [57] 57 (11/01 2020) Resp:  [17-19] 17 (11/01 2020) BP: (94-132)/(64-93) 132/93 (11/01 2239) SpO2:  [92 %] 92 % (11/01 1110) Weight:  [87.1  kg] 87.1 kg (11/02 0500) Physical Exam: General: NAD Cardiovascular: RRR no m/r/g Respiratory: CTAB Abdomen: Nontender, nodistended Extremities: Bilateral BKA  Laboratory: Recent Labs  Lab 12/31/20 0355 01/01/21 0615 01/02/21 0312  WBC 8.6 7.2 8.0  HGB 11.1* 12.0* 12.2*  HCT 35.7* 38.9* 39.6  PLT 304 340 356   Recent Labs  Lab 12/29/20 1155 12/30/20 0122 12/31/20 0355 01/01/21 0615 01/02/21 0312 01/03/21 0420 01/04/21 0346  NA 137 141 139   < > 138 138 137  K 4.1 4.1 4.2   < > 4.1 4.5 4.7  CL 104 106 103   < > 102 102 102  CO2 24 28 29    < > 31 29 28   BUN 30* 27* 27*   < > 24* 23 25*  CREATININE 1.65* 1.64* 1.78*   < > 1.65* 1.64* 1.75*  CALCIUM 8.7* 8.6* 8.3*   < > 8.3* 8.5* 8.5*  PROT 6.3* 5.4* 5.0*  --   --   --   --   BILITOT 1.4* 1.3* 0.9  --   --   --   --   ALKPHOS 90 80 71  --   --   --   --   ALT 42 33 30  --   --   --   --   AST 34 27 22  --   --   --   --   GLUCOSE 133* 153* 131*   < > 156* 118* 112*   < > = values in this interval not displayed.    Imaging/Diagnostic Tests:   Gerrit Heck, MD  01/04/2021, 6:25 AM PGY-1, [place] Intern pager: [phone], text pages welcome

## 2021-01-04 NOTE — Progress Notes (Signed)
FPTS Interim Progress Note  S:Patient found awake and laying across bed sideways. Assisted him to sit straight in bed. No other complaints. Discussed with RN, no concerns.   O: BP (!) 132/93   Pulse (!) 57   Temp 98.1 F (36.7 C) (Oral)   Resp 17   Ht 5\' 6"  (1.676 m)   Wt 87.6 kg   SpO2 92%   BMI 31.17 kg/m     A/P:  Hypertension Episode of symptomatic hypotension earlier today. PM hydralazine held. BP returning to normotension. Last BP 132/93. Will continue to hold hydralazine for 0600 dose, resume at 1400.    Ezequiel Essex, MD 01/04/2021, 2:00 AM PGY-2, [place] Medicine Service pager [phone]

## 2021-01-04 NOTE — Progress Notes (Signed)
Progress Note  Patient Name: [name] Date of Encounter: 01/04/2021  Primary Cardiologist: Werner Lean, MD   Subjective   Patient was seen and examined at his bedside. He was sleeping when I arrived but open his eyes to voice. No complaints at this time. Symptomatic hypotension per chart, hydralazine held yesterday.  Inpatient Medications    Scheduled Meds:  aspirin EC  81 mg Oral Daily   atorvastatin  40 mg Oral QHS   bisoprolol  5 mg Oral Daily   divalproex  125 mg Oral Q12H   dorzolamide-timolol  1 drop Left Eye BID   furosemide  80 mg Oral BID   [START ON 01/05/2021] hydrALAZINE  25 mg Oral Q8H   influenza vac split quadrivalent PF  0.5 mL Intramuscular Tomorrow-1000   isosorbide mononitrate  30 mg Oral Daily   levETIRAcetam  750 mg Oral BID   polyethylene glycol  17 g Oral BID   sacubitril-valsartan  1 tablet Oral BID   senna-docusate  1 tablet Oral QHS   sertraline  50 mg Oral q AM   tamsulosin  0.8 mg Oral QPC supper   Continuous Infusions:  sodium chloride 10 mL/hr at 12/30/20 0931   PRN Meds:    Vital Signs    Vitals:   01/03/21 2239 01/04/21 0500 01/04/21 0817 01/04/21 1121  BP: (!) 132/93  101/73 92/67  Pulse:   77 (!) 57  Resp:    19  Temp:   98.1 F (36.7 C) (!) 97.4 F (36.3 C)  TempSrc:   Oral Oral  SpO2:   94% 96%  Weight:  87.1 kg    Height:        Intake/Output Summary (Last 24 hours) at 01/04/2021 1142 Last data filed at 01/04/2021 0800 Gross per 24 hour  Intake 712 ml  Output 1200 ml  Net -488 ml   Filed Weights   01/02/21 0318 01/03/21 0504 01/04/21 0500  Weight: 87.8 kg 87.6 kg 87.1 kg    Telemetry    Sinus rhythm, heart rate 66 bpm- Personally Reviewed  ECG    None today- Personally Reviewed  Physical Exam   GEN: No acute distress.   Neck: No JVD Cardiac: RRR, no murmurs, rubs, or gallops.  Respiratory: Clear to auscultation bilaterally. GI: Soft, nontender, non-distended  MS: Bilateral below-knee  amputation Neuro:  Nonfocal  Psych: Normal affect   Labs    Chemistry Recent Labs  Lab 12/29/20 1155 12/30/20 0122 12/31/20 0355 01/01/21 0615 01/02/21 0312 01/03/21 0420 01/04/21 0346  NA 137 141 139   < > 138 138 137  K 4.1 4.1 4.2   < > 4.1 4.5 4.7  CL 104 106 103   < > 102 102 102  CO2 24 28 29    < > 31 29 28   GLUCOSE 133* 153* 131*   < > 156* 118* 112*  BUN 30* 27* 27*   < > 24* 23 25*  CREATININE 1.65* 1.64* 1.78*   < > 1.65* 1.64* 1.75*  CALCIUM 8.7* 8.6* 8.3*   < > 8.3* 8.5* 8.5*  PROT 6.3* 5.4* 5.0*  --   --   --   --   ALBUMIN 3.1* 2.7* 2.4*  --   --   --   --   AST 34 27 22  --   --   --   --   ALT 42 33 30  --   --   --   --  ALKPHOS 90 80 71  --   --   --   --   BILITOT 1.4* 1.3* 0.9  --   --   --   --   GFRNONAA 46* 46* 42*   < > 46* 46* 43*  ANIONGAP 9 7 7    < > 5 7 7    < > = values in this interval not displayed.     Hematology Recent Labs  Lab 12/31/20 0355 01/01/21 0615 01/02/21 0312  WBC 8.6 7.2 8.0  RBC 4.26 4.65 4.73  HGB 11.1* 12.0* 12.2*  HCT 35.7* 38.9* 39.6  MCV 83.8 83.7 83.7  MCH 26.1 25.8* 25.8*  MCHC 31.1 30.8 30.8  RDW 15.9* 15.8* 15.5  PLT 304 340 356    Cardiac EnzymesNo results for input(s): TROPONINI in the last 168 hours. No results for input(s): TROPIPOC in the last 168 hours.   BNP Recent Labs  Lab 12/29/20 1207  BNP 2,475.4*     DDimer No results for input(s): DDIMER in the last 168 hours.   Radiology    No results found.  Cardiac Studies   Echocardiogram done December 18, 2020 IMPRESSIONS   1. Left ventricular ejection fraction, by estimation, is 20 to 25%. The  left ventricle has severely decreased function. The left ventricle  demonstrates global hypokinesis. There is mild concentric left ventricular  hypertrophy. Left ventricular diastolic   parameters are consistent with Grade III diastolic dysfunction  (restrictive). Elevated left ventricular end-diastolic pressure. The E/e'  is 29.6.   2. Right  ventricular systolic function is moderately reduced. The right  ventricular size is severely enlarged. There is moderately elevated  pulmonary artery systolic pressure. The estimated right ventricular  systolic pressure is 96.2 mmHg.   3. Left atrial size was mildly dilated.   4. Right atrial size was mild to moderately dilated.   5. The mitral valve is grossly normal. Mild mitral valve regurgitation.  No evidence of mitral stenosis.   6. Tricuspid valve regurgitation is moderate to severe.   7. The aortic valve is tricuspid. Aortic valve regurgitation is not  visualized. No aortic stenosis is present.   8. The inferior vena cava is dilated in size with >50% respiratory  variability, suggesting right atrial pressure of 8 mmHg.   Comparison(s): Changes from prior study are noted. The left ventricular  function is significantly worse.   FINDINGS   Left Ventricle: Left ventricular ejection fraction, by estimation, is 20  to 25%. The left ventricle has severely decreased function. The left  ventricle demonstrates global hypokinesis. The left ventricular internal  cavity size was normal in size. There  is mild concentric left ventricular hypertrophy. Left ventricular  diastolic parameters are consistent with Grade III diastolic dysfunction  (restrictive). Elevated left ventricular end-diastolic pressure. The E/e'  is 29.6.   Right Ventricle: The right ventricular size is severely enlarged. No  increase in right ventricular wall thickness. Right ventricular systolic  function is moderately reduced. There is moderately elevated pulmonary  artery systolic pressure. The tricuspid  regurgitant velocity is 3.41 m/s, and with an assumed right atrial  pressure of 8 mmHg, the estimated right ventricular systolic pressure is  83.6 mmHg.   Left Atrium: Left atrial size was mildly dilated.   Right Atrium: Right atrial size was mild to moderately dilated.   Pericardium: Trivial pericardial  effusion is present.   Mitral Valve: The mitral valve is grossly normal. Mild mitral valve  regurgitation. No evidence of mitral valve stenosis. MV  peak gradient, 4.1  mmHg. The mean mitral valve gradient is 1.0 mmHg.   Tricuspid Valve: The tricuspid valve is grossly normal. Tricuspid valve  regurgitation is moderate to severe. No evidence of tricuspid stenosis.   Aortic Valve: The aortic valve is tricuspid. Aortic valve regurgitation is  not visualized. No aortic stenosis is present. Aortic valve mean gradient  measures 2.0 mmHg. Aortic valve peak gradient measures 3.1 mmHg. Aortic  valve area, by VTI measures 2.41  cm.   Pulmonic Valve: The pulmonic valve was grossly normal. Pulmonic valve  regurgitation is trivial. No evidence of pulmonic stenosis.   Aorta: The aortic root and ascending aorta are structurally normal, with  no evidence of dilitation.   Venous: The inferior vena cava is dilated in size with greater than 50%  respiratory variability, suggesting right atrial pressure of 8 mmHg.   IAS/Shunts: The atrial septum is grossly normal. IMPRESSIONS     1. Left ventricular ejection fraction, by estimation, is 20 to 25%. The  left ventricle has severely decreased function. The left ventricle  demonstrates global hypokinesis. There is mild concentric left ventricular  hypertrophy. Left ventricular diastolic   parameters are consistent with Grade III diastolic dysfunction  (restrictive). Elevated left ventricular end-diastolic pressure. The E/e'  is 29.6.   2. Right ventricular systolic function is moderately reduced. The right  ventricular size is severely enlarged. There is moderately elevated  pulmonary artery systolic pressure. The estimated right ventricular  systolic pressure is 16.1 mmHg.   3. Left atrial size was mildly dilated.   4. Right atrial size was mild to moderately dilated.   5. The mitral valve is grossly normal. Mild mitral valve regurgitation.  No  evidence of mitral stenosis.   6. Tricuspid valve regurgitation is moderate to severe.   7. The aortic valve is tricuspid. Aortic valve regurgitation is not  visualized. No aortic stenosis is present.   8. The inferior vena cava is dilated in size with >50% respiratory  variability, suggesting right atrial pressure of 8 mmHg.   Comparison(s): Changes from prior study are noted. The left ventricular  function is significantly worse.   FINDINGS   Left Ventricle: Left ventricular ejection fraction, by estimation, is 20  to 25%. The left ventricle has severely decreased function. The left  ventricle demonstrates global hypokinesis. The left ventricular internal  cavity size was normal in size. There  is mild concentric left ventricular hypertrophy. Left ventricular  diastolic parameters are consistent with Grade III diastolic dysfunction  (restrictive). Elevated left ventricular end-diastolic pressure. The E/e'  is 29.6.   Right Ventricle: The right ventricular size is severely enlarged. No  increase in right ventricular wall thickness. Right ventricular systolic  function is moderately reduced. There is moderately elevated pulmonary  artery systolic pressure. The tricuspid  regurgitant velocity is 3.41 m/s, and with an assumed right atrial  pressure of 8 mmHg, the estimated right ventricular systolic pressure is  09.6 mmHg.   Left Atrium: Left atrial size was mildly dilated.   Right Atrium: Right atrial size was mild to moderately dilated.   Pericardium: Trivial pericardial effusion is present.   Mitral Valve: The mitral valve is grossly normal. Mild mitral valve  regurgitation. No evidence of mitral valve stenosis. MV peak gradient, 4.1  mmHg. The mean mitral valve gradient is 1.0 mmHg.   Tricuspid Valve: The tricuspid valve is grossly normal. Tricuspid valve  regurgitation is moderate to severe. No evidence of tricuspid stenosis.   Aortic Valve: The aortic  valve is tricuspid.  Aortic valve regurgitation is  not visualized. No aortic stenosis is present. Aortic valve mean gradient  measures 2.0 mmHg. Aortic valve peak gradient measures 3.1 mmHg. Aortic  valve area, by VTI measures 2.41  cm.   Pulmonic Valve: The pulmonic valve was grossly normal. Pulmonic valve  regurgitation is trivial. No evidence of pulmonic stenosis.   Aorta: The aortic root and ascending aorta are structurally normal, with  no evidence of dilitation.   Venous: The inferior vena cava is dilated in size with greater than 50%  respiratory variability, suggesting right atrial pressure of 8 mmHg.   IAS/Shunts: The atrial septum is grossly normal.   Patient Profile     65 y.o. male heart failure with reduced ejection fraction EF 20%, PAD status post hypertension, blindness.   Assessment & Plan    Symptomatic hypotension his hydralazine was held.  Agree with this.  Okay to cut back on the Lasix to 40 mg twice daily.  Heart failure with reduced ejection fraction-most recent EF 20% - He is noted to be NYHA class IV, stage C/D heart failure.  Unclear etiology likely nonischemic.  Creatinine remains at baseline.  Output 488 ml over the last 24 hours.  He was transitioned to Lasix 80 mg twice daily yesterday.  Okay to cut this back to Lasix 40 mg twice daily due to hypotension he appears euvolemic as well.    Continue patient on Entresto 24/26 mg twice daily, bisoprolol 5 mg daily.  Also his potassium is stable however his blood pressure is marginal we will hold off on adding the spironolactone for now.   Plans to add SGLT2 inhibitors in the outpatient setting. He will need to follow-up with cardiology and consideration for an ischemic evaluation in the outpatient setting.    [facility] will sign off.   Medication Recommendations: Lasix 40 mg twice daily, Entresto 24-26 mg grams twice a day, bisoprolol 5 mg daily, stop hydralazine. Other recommendations (labs, testing, etc): None Follow up  as an outpatient: We will need outpatient cardiology follow-up.  For questions or updates, please contact [place] Please consult [email or web address] for contact info under Cardiology/STEMI.      Signed, Berniece Salines, DO  01/04/2021, 11:42 AM

## 2021-01-05 LAB — RESP PANEL BY RT-PCR (FLU A&B, COVID) ARPGX2
Influenza A by PCR: NEGATIVE
Influenza B by PCR: NEGATIVE
SARS Coronavirus 2 by RT PCR: NEGATIVE

## 2021-01-05 LAB — BASIC METABOLIC PANEL
Anion gap: 7 (ref 5–15)
BUN: 26 mg/dL — ABNORMAL HIGH (ref 8–23)
CO2: 30 mmol/L (ref 22–32)
Calcium: 8.6 mg/dL — ABNORMAL LOW (ref 8.9–10.3)
Chloride: 102 mmol/L (ref 98–111)
Creatinine, Ser: 1.67 mg/dL — ABNORMAL HIGH (ref 0.61–1.24)
GFR, Estimated: 45 mL/min — ABNORMAL LOW (ref >60)
Glucose, Bld: 110 mg/dL — ABNORMAL HIGH (ref 70–99)
Potassium: 4.6 mmol/L (ref 3.5–5.1)
Sodium: 139 mmol/L (ref 135–145)

## 2021-01-05 LAB — MAGNESIUM: Magnesium: 2.3 mg/dL (ref 1.7–2.4)

## 2021-01-05 MED ORDER — FUROSEMIDE 40 MG PO TABS: 40.0000 mg | ORAL_TABLET | Freq: Two times a day (BID) | ORAL | 0 refills | Status: DC

## 2021-01-05 NOTE — Progress Notes (Signed)
Family Medicine Teaching Service Daily Progress Note Intern Pager: [phone]  Patient name: [name] Medical record number: [id number] Date of birth: [birth year] Age: 65 y.o. Gender: male  Primary Care Provider: [name], MD Consultants: None Code Status: DNR  Pt Overview and Major Events to Date:  10/27 Admitted  Assessment and Plan:  Dominica Tucker is 65 yo male admitted for abdominal pain and dyspnea concerning for HF exacerbation. PMH significant for HFrEF, HTN, T2DM, BKA, CKD stage III, prior CVA, seizure disorder   Acute on Chronic HFrEF Weight today 84.1 (87.1, 87.6) with 1.8L UOP yesterday. Mg was wnl at 2.3. Last EF 20%. Cardiology signed off, patient appears euvolemic and has no complaints of dyspnea.  -Continue Lasix 40 mg BID -Continue Entresto 24-26 BID -Continue Bidoprolo 5 mg daily -Strict I/O -Daily Weights -Out of bed daily -Mg/BMP daily  -Start SGLT2 in outpatient setting  CKD stage IIIb Creatinine 1.67 today, from 1.75 yesterday. GFR 45 (43,46) -Continue diuresing  Type 2 diabetes Hgb A1c 6.2 on admission, glucose range stable at 110 overnight -Continue holding home meds  Hypertension  BP today range 92-127/73-83 -Continue home meds:  Seizure disorder -Continue home Depakote 125 mg BID -Seizure precautions, fall precautions  FEN/GI: Heart Healthy Carb modified PPx: Lovenox 40 mg, accepted med's last night Dispo:SNF today.   Subjective:  Patient resting comfortably in bed, with no complaints. Notes his eyes have been a little irritated today.   Objective: Temp:  [97.4 F (36.3 C)-98.1 F (36.7 C)] 98.1 F (36.7 C) (11/03 0352) Pulse Rate:  [57-77] 60 (11/03 0352) Resp:  [14-19] 14 (11/03 0352) BP: (92-127)/(67-83) 115/79 (11/03 0352) SpO2:  [91 %-97 %] 97 % (11/03 0352) Weight:  [84.1 kg] 84.1 kg (11/03 0352) Physical Exam: General: Well appearing, alert, african [place] man, NAAD Cardiovascular: RRR, [place] Respiratory: Coarse breath  sounds, no crackles or wheezes Abdomen: Soft, non-tender, non-distended Extremities: Pulses intact in upper extremities. No pitting edema appreciated on exam. HEENT: Conjunctival injection with mucous around eyelids  Laboratory: Recent Labs  Lab 12/31/20 0355 01/01/21 0615 01/02/21 0312  WBC 8.6 7.2 8.0  HGB 11.1* 12.0* 12.2*  HCT 35.7* 38.9* 39.6  PLT 304 340 356   Recent Labs  Lab 12/29/20 1155 12/30/20 0122 12/31/20 0355 01/01/21 0615 01/03/21 0420 01/04/21 0346 01/05/21 0331  NA 137 141 139   < > 138 137 139  K 4.1 4.1 4.2   < > 4.5 4.7 4.6  CL 104 106 103   < > 102 102 102  CO2 24 28 29    < > 29 28 30   BUN 30* 27* 27*   < > 23 25* 26*  CREATININE 1.65* 1.64* 1.78*   < > 1.64* 1.75* 1.67*  CALCIUM 8.7* 8.6* 8.3*   < > 8.5* 8.5* 8.6*  PROT 6.3* 5.4* 5.0*  --   --   --   --   BILITOT 1.4* 1.3* 0.9  --   --   --   --   ALKPHOS 90 80 71  --   --   --   --   ALT 42 33 30  --   --   --   --   AST 34 27 22  --   --   --   --   GLUCOSE 133* 153* 131*   < > 118* 112* 110*   < > = values in this interval not displayed.      Imaging/Diagnostic Tests:   Holley Bouche, MD  01/05/2021, 7:38 AM PGY-1, [place] Intern pager: [phone], text pages welcome

## 2021-01-05 NOTE — TOC Progression Note (Signed)
Transition of Care [facility]) - Progression Note    Patient Details  Name: ADONTE VANRIPER MRN: [id number] Date of Birth: 12-Sep-1955  Transition of [facility]) CM/SW Contact  Reece Agar, [place] Phone Number: 01/05/2021, 10:30 AM  Clinical Narrative:    CSW spoke with Helene Kelp at [facility] to follow up on pt return, Helene Kelp is waiting on a DC summary and a COVID test for DC.   Expected Discharge Plan: Long Term Nursing Home Barriers to Discharge: Continued Medical Work up  Expected Discharge Plan and Services Expected Discharge Plan: [place] In-house Referral: Clinical Social Work   Post Acute Care Choice: [place] Living arrangements for the past 2 months: [facility]                                       Social Determinants of Health (SDOH) Interventions    Readmission Risk Interventions No flowsheet data found.

## 2021-01-05 NOTE — Discharge Summary (Addendum)
[facility] Discharge Summary  Patient name: [name] Medical record number: [id number] Date of birth: 07/03/1955 Age: 65 y.o. Gender: male Date of Admission: 12/29/2020  Date of Discharge: 01/05/2021 Admitting Physician: [name], DO  Primary Care Provider: [name], MD Consultants: Cardiology  Indication for Hospitalization: CHF exacerbation  Discharge Diagnoses/Problem List:  Acute on Chronic HFrEF Pressure Ulcer CKD Stage IIIb Type 2 Diabetes HTN Seizure Disorder Glaucoma  Disposition: SNF  Discharge Condition: Improved, stable  Discharge Exam:  Per Dr. Marcina Millard on day of discharge: General: Well appearing, alert, african american man, NAAD Cardiovascular: RRR, [place] Respiratory: Coarse breath sounds, no crackles or wheezes Abdomen: Soft, non-tender, non-distended Extremities: Pulses intact in upper extremities. No pitting edema appreciated on exam. HEENT: Conjunctival injection with mucous around eyelids  Brief Hospital Course:  [name] is a 65 year old male who presented with abdominal pain and shortness of breath concerning for CHF exacerbation.  Past medical history significant for HFrEF, HTN, HLD, type 2 diabetes, is s/p bilateral BKA, CKD stage III, prior CVA, seizure disorder   Acute on Chronic HFrEF Patient presented with abdominal pain and shortness of breath x1 week. He was recently admitted for acute CHF exacerbation two weeks prior at which time echo showed EF 20-25% with global LV hypokinesis and grade III diastolic dysfunction, as well as mod to severe TR. On admission patient required 2L nasal cannula, BNP was 2400 and CXR showed cardiomegaly and interstitial edema. Cardiology was consulted. Pt was treated with Lasix up to 120 mg IV BID but at the time of discharge was on 40mg  PO Lasix BID with good response. He was continued on home Imdur, Entresto, and Bisoprolol. Ultimately, pt had a total of 7.6 L UOP.    Elevated Troponin Troponin was elevated from 354 to 1208 and then downtrended to 897 over admission. EKG showed no changes and patient had no chest pain. Heparin gtt  was ordered for total of 48 hrs. Cardiology suggest this was d/t demand ischemia from known heart failure.  Patient did not require left and right heart caths. He will need out patient follow up with cardiology to be evaluated for ischemia.   CKD stage IIIb Cr stable at 1.67 on day of discharge. Baseline appears to be ~1.4. Recommend close follow up with PCP to trend Cr and GFR. Electrolytes remained stable over admission.  T2DM Sugars were well controlled during admission with minimal to no sliding scale coverage. Home Alogliptin and Metformin were discontinued at discharge as he had excellent glycemic control (recent A1c 6.2%).  Hypertension Hydralazine discontinued do to low-normal blood pressures.  All other chronic health conditions were stable and treated with home medications.  Issues for Follow Up:  Ensure outpatient cardiology follow-up Consider starting SGLT2 and spironolactone per cardiology Close follow up with PCP to check Cr and electrolytes Consider restarting hydralazine if appropriate  Significant Procedures: none  Significant Labs and Imaging:  Recent Labs  Lab 12/31/20 0355 01/01/21 0615 01/02/21 0312  WBC 8.6 7.2 8.0  HGB 11.1* 12.0* 12.2*  HCT 35.7* 38.9* 39.6  PLT 304 340 356   Recent Labs  Lab 12/30/20 0122 12/31/20 0355 01/01/21 0615 01/01/21 1302 01/02/21 0312 01/03/21 0420 01/04/21 0346 01/05/21 0331  NA 141 139   < > 141 138 138 137 139  K 4.1 4.2   < > 3.8 4.1 4.5 4.7 4.6  CL 106 103   < > 102 102 102 102 102  CO2 28 29   < >  32 31 29 28 30   GLUCOSE 153* 131*   < > 144* 156* 118* 112* 110*  BUN 27* 27*   < > 23 24* 23 25* 26*  CREATININE 1.64* 1.78*   < > 1.56* 1.65* 1.64* 1.75* 1.67*  CALCIUM 8.6* 8.3*   < > 8.4* 8.3* 8.5* 8.5* 8.6*  MG  --   --    < > 2.0 1.9 2.3 2.3  2.3  ALKPHOS 80 71  --   --   --   --   --   --   AST 27 22  --   --   --   --   --   --   ALT 33 30  --   --   --   --   --   --   ALBUMIN 2.7* 2.4*  --   --   --   --   --   --    < > = values in this interval not displayed.     Results/Tests Pending at Time of Discharge: None  Discharge Medications:  Allergies as of 01/05/2021   No Known Allergies      Medication List     STOP taking these medications    Alogliptin Benzoate 25 MG Tabs   hydrALAZINE 50 MG tablet Commonly known as: APRESOLINE   metFORMIN 1000 MG tablet Commonly known as: GLUCOPHAGE   pantoprazole 20 MG tablet Commonly known as: PROTONIX       TAKE these medications    acetaminophen 325 MG tablet Commonly known as: TYLENOL Take 650 mg by mouth every 6 (six) hours as needed for mild pain (AND CANNOT EXCEED 3,000 MG/24 HOURS OF TYLENOL FROM ALL COMBINED SOURCES).   Altalube 85-15 % Oint Place 1 application into both eyes at bedtime.   aspirin 81 MG EC tablet Take 81 mg by mouth daily.   atorvastatin 40 MG tablet Commonly known as: LIPITOR Take 40 mg by mouth at bedtime.   bisacodyl 5 MG EC tablet Commonly known as: DULCOLAX Take 10 mg by mouth daily as needed for moderate constipation.   bisoprolol 5 MG tablet Commonly known as: ZEBETA Take 1 tablet (5 mg total) by mouth daily.   divalproex 125 MG DR tablet Commonly known as: DEPAKOTE Take 125 mg by mouth in the morning and at bedtime.   Entresto 24-26 MG Generic drug: sacubitril-valsartan Take 1 tablet by mouth 2 (two) times daily.   furosemide 40 MG tablet Commonly known as: LASIX Take 1 tablet (40 mg total) by mouth 2 (two) times daily. What changed: when to take this   GlucaGen HypoKit 1 MG Solr injection Generic drug: glucagon Inject 1 mg into the muscle once as needed for low blood sugar.   isosorbide mononitrate 30 MG 24 hr tablet Commonly known as: IMDUR Take 1 tablet (30 mg total) by mouth daily.   levETIRAcetam  750 MG tablet Commonly known as: KEPPRA Take 1 tablet (750 mg total) by mouth 2 (two) times daily.   polyethylene glycol 17 g packet Commonly known as: MIRALAX / GLYCOLAX Take 17 g by mouth 2 (two) times daily.   Rhopressa 0.02 % Soln Generic drug: Netarsudil Dimesylate Place 1 drop into the left eye at bedtime.   senna-docusate 8.6-50 MG tablet Commonly known as: Senokot-S Take 1 tablet by mouth at bedtime.   sertraline 50 MG tablet Commonly known as: ZOLOFT Take 50 mg by mouth in the morning.   Systane 0.4-0.3 % Gel  ophthalmic gel Generic drug: Polyethyl Glycol-Propyl Glycol Place 1 application into both eyes every 8 (eight) hours.   tamsulosin 0.4 MG Caps capsule Commonly known as: FLOMAX Take 2 capsules (0.8 mg total) by mouth daily after supper.               Discharge Care Instructions  (From admission, onward)           Start     Ordered   01/05/21 0000  Discharge wound care:       Comments: Per nursing   01/05/21 1338            Discharge Instructions: Please refer to Patient Instructions section of EMR for full details.  Patient was counseled important signs and symptoms that should prompt return to medical care, changes in medications, dietary instructions, activity restrictions, and follow up appointments.   Follow-Up Appointments:  Follow-up Information     [name], MD Follow up.   Specialty: Internal Medicine Contact information: [place] [address] [phone]         Werner Lean, MD .   Specialty: Cardiology Contact information: [place] [address] [phone]                 Alcus Dad, MD 01/05/2021, 3:36 PM PGY-2, [place]

## 2021-01-18 ENCOUNTER — Ambulatory Visit (HOSPITAL_COMMUNITY)
Admission: RE | Admit: 2021-01-18 | Discharge: 2021-01-18 | Disposition: A | Discharge status: Home / Self Care | Payer: Medicaid Other | Source: Ambulatory Visit | Attending: Family Medicine | Admitting: Family Medicine

## 2021-01-18 ENCOUNTER — Encounter (HOSPITAL_COMMUNITY): Payer: Self-pay

## 2021-01-18 VITALS — BP 130/98 | HR 62 | Wt 196.6 lb

## 2021-01-18 DIAGNOSIS — N183 Chronic kidney disease, stage 3 unspecified: Secondary | ICD-10-CM

## 2021-01-18 DIAGNOSIS — I130 Hypertensive heart and chronic kidney disease with heart failure and stage 1 through stage 4 chronic kidney disease, or unspecified chronic kidney disease: Secondary | ICD-10-CM | POA: Diagnosis present

## 2021-01-18 DIAGNOSIS — Z993 Dependence on wheelchair: Secondary | ICD-10-CM | POA: Insufficient documentation

## 2021-01-18 DIAGNOSIS — E1122 Type 2 diabetes mellitus with diabetic chronic kidney disease: Secondary | ICD-10-CM | POA: Insufficient documentation

## 2021-01-18 DIAGNOSIS — I2510 Atherosclerotic heart disease of native coronary artery without angina pectoris: Secondary | ICD-10-CM | POA: Insufficient documentation

## 2021-01-18 DIAGNOSIS — Z89511 Acquired absence of right leg below knee: Secondary | ICD-10-CM | POA: Insufficient documentation

## 2021-01-18 DIAGNOSIS — Z7984 Long term (current) use of oral hypoglycemic drugs: Secondary | ICD-10-CM | POA: Diagnosis not present

## 2021-01-18 DIAGNOSIS — E1151 Type 2 diabetes mellitus with diabetic peripheral angiopathy without gangrene: Secondary | ICD-10-CM | POA: Insufficient documentation

## 2021-01-18 DIAGNOSIS — Z79899 Other long term (current) drug therapy: Secondary | ICD-10-CM | POA: Insufficient documentation

## 2021-01-18 DIAGNOSIS — Z9114 Patient's other noncompliance with medication regimen: Secondary | ICD-10-CM

## 2021-01-18 DIAGNOSIS — G40909 Epilepsy, unspecified, not intractable, without status epilepticus: Secondary | ICD-10-CM | POA: Insufficient documentation

## 2021-01-18 DIAGNOSIS — I5042 Chronic combined systolic (congestive) and diastolic (congestive) heart failure: Secondary | ICD-10-CM

## 2021-01-18 DIAGNOSIS — Z7982 Long term (current) use of aspirin: Secondary | ICD-10-CM | POA: Insufficient documentation

## 2021-01-18 DIAGNOSIS — Z794 Long term (current) use of insulin: Secondary | ICD-10-CM

## 2021-01-18 DIAGNOSIS — N1831 Chronic kidney disease, stage 3a: Secondary | ICD-10-CM | POA: Insufficient documentation

## 2021-01-18 DIAGNOSIS — I739 Peripheral vascular disease, unspecified: Secondary | ICD-10-CM

## 2021-01-18 DIAGNOSIS — I10 Essential (primary) hypertension: Secondary | ICD-10-CM

## 2021-01-18 DIAGNOSIS — Z89512 Acquired absence of left leg below knee: Secondary | ICD-10-CM | POA: Insufficient documentation

## 2021-01-18 DIAGNOSIS — E1140 Type 2 diabetes mellitus with diabetic neuropathy, unspecified: Secondary | ICD-10-CM | POA: Insufficient documentation

## 2021-01-18 DIAGNOSIS — H548 Legal blindness, as defined in USA: Secondary | ICD-10-CM | POA: Diagnosis not present

## 2021-01-18 LAB — BASIC METABOLIC PANEL
Anion gap: 10 (ref 5–15)
BUN: 57 mg/dL — ABNORMAL HIGH (ref 8–23)
CO2: 26 mmol/L (ref 22–32)
Calcium: 8.9 mg/dL (ref 8.9–10.3)
Chloride: 102 mmol/L (ref 98–111)
Creatinine, Ser: 2.11 mg/dL — ABNORMAL HIGH (ref 0.61–1.24)
GFR, Estimated: 34 mL/min — ABNORMAL LOW (ref >60)
Glucose, Bld: 126 mg/dL — ABNORMAL HIGH (ref 70–99)
Potassium: 4 mmol/L (ref 3.5–5.1)
Sodium: 138 mmol/L (ref 135–145)

## 2021-01-18 LAB — CBC
HCT: 40.1 % (ref 39.0–52.0)
Hemoglobin: 12.6 g/dL — ABNORMAL LOW (ref 13.0–17.0)
MCH: 25.8 pg — ABNORMAL LOW (ref 26.0–34.0)
MCHC: 31.4 g/dL (ref 30.0–36.0)
MCV: 82 fL (ref 80.0–100.0)
Platelets: 385 K/uL (ref 150–400)
RBC: 4.89 MIL/uL (ref 4.22–5.81)
RDW: 15.6 % — ABNORMAL HIGH (ref 11.5–15.5)
WBC: 7.8 K/uL (ref 4.0–10.5)
nRBC: 0 % (ref 0.0–0.2)

## 2021-01-18 MED ORDER — FUROSEMIDE 20 MG PO TABS: 20.0000 mg | ORAL_TABLET | Freq: Two times a day (BID) | ORAL | 3 refills | Status: DC

## 2021-01-18 MED ORDER — SPIRONOLACTONE 25 MG PO TABS: 12.5000 mg | ORAL_TABLET | Freq: Every day | ORAL | 3 refills | Status: AC

## 2021-01-18 NOTE — Progress Notes (Signed)
ReDS Vest / Clip - 01/18/21 1100       ReDS Vest / Clip   Station Marker C    Ruler Value 34    ReDS Value Range Low volume    ReDS Actual Value 33    Anatomical Comments sitting

## 2021-01-18 NOTE — Progress Notes (Signed)
ADVANCED HF CLINIC CONSULT NOTE  Primary Care: [name], MD Primary Cardiologist: Dr. Haroldine Laws  HPI: 64 y.o.AA male w/ h/o chronic systolic heart failure>>>HFimEF, PAD s/p bilateral BKA, HTN, Stage IIIa CKD, nonobstructive CAD and Type II DM. Legally blind left eye. H/o PAD/stump complications w/ h/o wounds. Seizure disorder.    Previously followed in the [facility] but not seen there since 2018. Had compliance issues and required paramedicine.    Echo 07/2015: EF 15%, RV severely reduced R/LHC 07/2015: minimal non-obstructive CAD, normal CO 6.01. CI 2.7    Echo 09/2016: EF 20-25% Echo 02/2017: EF 15-20%, G3DD, RV mild- mod reduced  Echo 06/2020: EF 50-55%, RV normal    Admitted to [facility] 10/22 w/ acute CHF. Recurrence in the setting of poor med compliance as he had stopped taking all meds. EF back down again, 20-25%, RV moderately reduced w/ mod-severe TR.  Diuresed w/ IV Lasix and GDMT restarted. D/w wt 206 lb. Discharged to SNF.   Seen in [facility] 10/22 for hospital follow up and was volume overloaded, ReDs 45%. Entresto started.  Readmitted 11/22 with A/C CHF. Given IV Lasix with improvement of symptoms. GDMT continued, hydralazine stopped with low BP. Weight 185 lbs.  Today he returns for post hospital HF follow up. Overall feeling fine. He remains at his facility. Gets around in his WC, not SOB with transfers, limited mostly by weakness. He has RLE prosthetic he is able to bear weight on, but no prosthetic for left. Denies CP, dizziness, edema, or PND/Orthopnea. Appetite fair, does not really like food at facility. No fever or chills.  Taking all medications provided by facility.  He informs me that he will remain a permanent resident at his current SNF. No plans to return home.   Cardiac Testing  Echo 5/17: EF 15%, RV severely reduced Echo 09/2016: EF 20-25% Echo 02/2017: EF 15-20%, G3DD, RV mild- mod reduced  Echo 06/2020: EF 50-55%, RV normal  Echo 10/22: EF 20-25%, RV moderately reduced  w/ mod-severe TR.   [facility] 5/17 Mid LAD lesion, 20% stenosed.   Findings:   Ao = 135/87 (107) LV = 141/20/30 RA = 17 RV = 67/11/20 PA = 62/29 (41) PCW = 35 (v wave 45-50) Fick cardiac output/index = 6.0/2.7 PVR = 1.0 WU SVR = 1198  FA sat = 88% PA sat = 58%, 57%   Assessment: 1. Minimal non-obstructive CAD 2. Severe NICM with EF 15% by echo 3. Markedly elevated filling pressures with normal PVR 4. Normal cardiac output  Review of Systems: [y] = yes, [ ]  = no   General: Weight gain [ ] ; Weight loss [ ] ; Anorexia Blue.Reese ]; Fatigue [ ] ; Fever [ ] ; Chills [ ] ; Weakness Blue.Reese ]  Cardiac: Chest pain/pressure [ ] ; Resting SOB [ ] ; Exertional SOB [ ] ; Orthopnea [ ] ; Pedal Edema [ ] ; Palpitations [ ] ; Syncope [ ] ; Presyncope [ ] ; Paroxysmal nocturnal dyspnea[ ]   Pulmonary: Cough [ ] ; Wheezing[ ] ; Hemoptysis[ ] ; Sputum [ ] ; Snoring [ ]   GI: Vomiting[ ] ; Dysphagia[ ] ; Melena[ ] ; Hematochezia [ ] ; Heartburn[ ] ; Abdominal pain [ ] ; Constipation [ ] ; Diarrhea [ ] ; BRBPR [ ]   GU: Hematuria[ ] ; Dysuria [ ] ; Nocturia[ ]   Vascular: Pain in legs with walking [ ] ; Pain in feet with lying flat [ ] ; Non-healing sores [ ] ; Stroke [ ] ; TIA [ ] ; Slurred speech Blue.Reese ];  Neuro: Headaches[ ] ; Vertigo[ ] ; Seizures[ ] ; Paresthesias[ ] ;Blurred vision [ ] ; Diplopia [ ] ; Vision  changes [ ]   Ortho/Skin: Arthritis [ ] ; Joint pain [ ] ; Muscle pain [ ] ; Joint swelling [ ] ; Back Pain [ ] ; Rash [ ]   Psych: Depression[ ] ; Anxiety[ ]   Heme: Bleeding problems [ ] ; Clotting disorders [ ] ; Anemia [ ]   Endocrine: Diabetes [ y]; Thyroid dysfunction[ ]   Past Medical History:  Diagnosis Date   Acute on chronic systolic heart failure, NYHA class 3 (HCC)    AKI (acute kidney injury) ([place])    Anemia    BKA stump complication (HCC)    Blind left eye    Blind left eye    Cardiomyopathy, dilated (HCC)    CHF (congestive heart failure) (HCC)    Combined systolic and diastolic congestive heart failure (HCC)    Congestive dilated  cardiomyopathy ([place]) 07/11/2015   Diabetes mellitus    Diabetic neuropathy associated with type 2 diabetes mellitus ([place])    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     aspirin 81 MG EC tablet Take 81 mg by mouth daily.     atorvastatin (LIPITOR) 40 MG tablet Take 40 mg by mouth at bedtime.      bisacodyl (DULCOLAX) 5 MG EC tablet Take 10 mg by mouth daily as needed for moderate constipation.     bisoprolol (ZEBETA) 5 MG tablet Take 1 tablet (5 mg total) by mouth daily. 30 tablet 1   divalproex (DEPAKOTE) 125 MG DR tablet Take 125 mg by mouth in the morning and at bedtime.     furosemide (LASIX) 40 MG tablet Take 1 tablet (40 mg total) by mouth 2 (two) times daily. 30 tablet 0   GLUCAGEN HYPOKIT 1 MG SOLR injection Inject 1 mg into the muscle once as needed for low blood sugar.     isosorbide mononitrate (IMDUR) 30 MG 24 hr tablet Take 1 tablet (30 mg total) by mouth daily.     levETIRAcetam (KEPPRA) 750 MG tablet Take 1 tablet (750 mg total) by mouth 2 (two) times daily. 60 tablet 0   Polyethyl Glycol-Propyl Glycol (SYSTANE) 0.4-0.3 % GEL ophthalmic gel Place 1 application into both eyes every 8 (eight) hours.     polyethylene glycol (MIRALAX / GLYCOLAX) packet Take 17 g by mouth 2 (two) times daily. 14 each 0   RHOPRESSA 0.02 % SOLN Place 1 drop into the left eye at bedtime.     sacubitril-valsartan (ENTRESTO) 24-26 MG Take 1 tablet by mouth 2 (two) times daily. 60 tablet 3   senna-docusate (SENOKOT-S) 8.6-50 MG  tablet Take 1 tablet by mouth at bedtime.     sertraline (ZOLOFT) 50 MG tablet Take 50 mg by mouth in the morning.     tamsulosin (FLOMAX) 0.4 MG CAPS capsule Take 2 capsules (0.8 mg total) by mouth daily after supper. 60 capsule 0   White Petrolatum-Mineral Oil (ALTALUBE) 85-15 % OINT Place 1 application into both eyes at bedtime.     No current facility-administered  medications for this encounter.   No Known Allergies  Social History   Socioeconomic History   Marital status: Married    Spouse name: Not on file   Number of children: 0   Years of education: 12   Highest education level: Not on file  Occupational History   Not on file  Tobacco Use   Smoking status: Former    Years: 20.00    Types: Cigarettes    Quit date: 05/10/2000    Years since quitting: 20.7   Smokeless tobacco: Former    Quit date: 02/22/2000  Vaping Use   Vaping Use: Never used  Substance and Sexual Activity   Alcohol use: No    Alcohol/week: 0.0 standard drinks   Drug use: No   Sexual activity: Not on file  Other Topics Concern   Not on file  Social History Narrative   ** Merged History Encounter **       Lives alone.    Caffeine use: Tea/soda daily   Coffee sometimes    Left handed    Social Determinants of Health   Financial Resource Strain: Not on file  Food Insecurity: Not on file  Transportation Needs: Not on file  Physical Activity: Not on file  Stress: Not on file  Social Connections: Not on file  Intimate Partner Violence: Not on file   Family History  Problem Relation Age of Onset   Cancer Mother    Peripheral vascular disease Father    BP (!) 130/98   Pulse 62   Wt 89.2 kg (196 lb 9.6 oz) Comment: Patient was weighed at [place] [facility] on 12/25/20 .  SpO2 94%   BMI 31.73 kg/m   Wt Readings from Last 3 Encounters:  01/18/21 89.2 kg (196 lb 9.6 oz)  01/05/21 84.1 kg (185 lb 6.5 oz)  12/27/20 88.5 kg (195 lb)   PHYSICAL EXAM: General:  NAD. No resp  difficulty, arrived in [facility], chronically-ill appearing. HEENT: legally blind Neck: Supple. JVP 7-8. Carotids 2+ bilat; no bruits. No lymphadenopathy or thryomegaly appreciated. Cor: PMI nondisplaced. Regular rate & rhythm. No rubs, gallops or murmurs. Lungs: Clear Abdomen: Soft, nontender, nondistended. No hepatosplenomegaly. No bruits or masses. Good bowel sounds. Extremities: No cyanosis, clubbing, rash, edema; bilateral BKA, + RLE prosthesis Neuro: Alert & oriented x 3, cranial nerves grossly intact. Moves all 4 extremities w/o difficulty. Affect pleasant.  ECG: SB rBBB 59 bpm (personally reviewed).  ReDs: 33%  ASSESSMENT & PLAN:  1. Chronic Systolic Heart Failure - NICM - Echo 07/2015: EF 15%, RV severely reduced - R/LHC 07/2015: minimal non-obstructive CAD, normal CO 6.01. CI 2.7  - Echo 09/2016: EF 20-25% - Echo 02/2017: EF 15-20%, G3DD, RV mild- mod reduced  - Echo 06/2020: EF normalized 50-55%, RV normal  - Echo 10/22 EF back down 20-25% in setting of stopping meds  - Stable NYHA II, functional class limited due to primarily wheel-chair bound but no SOB with transfers. ReDs Clip, 33%. - Restart spiro 12.5 mg daily. - Reduce Lasix to 20 mg bid. - Continue Entresto 24-26 mg bid - Continue Bisoprolol 5 mg daily  - Continue Imdur 30, off hydralazine as of previous hospitalization. - Also consider future addition of SLGT2i (would need to coordinate w/ PCP given multidrug regimen) - Given h/o poor compliance, severe PAD s/p bilateral BKA w/ h/o stump complications/wound infections, blindness, wheel chair/ SNF dependent, he is not a candidate for advanced heart failure therapies. Hopefully EF will improve w/ better  med compliance now that he has assistance at [facility]. Will plan to see him back in [facility] clinic to continue w/ further med titration, then will plan to refer to cardiology for further management.  - Poor candidate for ICD given risk for infection w/ chronic stump wounds. Stump looks  good today - BMET today, repeat in 1 week and 4 weeks.   2. Stage IIIa CKD - SCr baseline ~1.5 - BMET today. - Follow renal function closely with addition on spiro.   3. HTN - Controlled on current regimen. - BMET today.   4. Non-obstructive CAD - LHC 2017 w/ minimal 1V non-obstructive CAD, 20% mid LAD - Stable w/o CP  - on ASA/statin/? blocker    5. PVD - s/p bilateral BKA - ASA/statin.    6. H/o Poor Compliance  - Previously require paramedicine - Now SNF resident where daily meds administered.    7. Type 2DM  - Well-controlled, Hgb A1c 6.1 - On insulin, metformin + januvia - May benefit from CV standpoint from addition of SLGT2i (would need to coordinate w/ PCP given multidrug regimen). - Monitor left stump for wounds.  Follow up in 6 weeks with APP for further medication titration. Plan to repeat echo in 3 months when GDMT optimized.  Allena Katz, FNP-BC 01/18/21

## 2021-01-18 NOTE — Patient Instructions (Signed)
EKG done today.  Labs done today. We will contact you only if your labs are abnormal.  START Spironolactone 12.5mg  (1/2 tablet) by mouth daily.   CHANGE Lasix to 20mg  (1 tablet) by mouth 2 times daily.   No other medication changes were made. Please continue all current medications as prescribed.  Your physician recommends that you schedule a follow-up appointment in: 1 week and in 4 weeks for a lab only appointment(can be done at facility) and in 6 weeks with our NP/PA Clinic here in our office and in 12 weeks with Dr. Haroldine Laws with an echo prior to your appointment.  Your physician has requested that you have an echocardiogram. Echocardiography is a painless test that uses sound waves to create images of your heart. It provides your doctor with information about the size and shape of your heart and how well your heart's chambers and valves are working. This procedure takes approximately one hour. There are no restrictions for this procedure.  If you have any questions or concerns before your next appointment please send [place] a message through [place] or call our office at [phone].    TO LEAVE A MESSAGE FOR THE NURSE SELECT OPTION 2, PLEASE LEAVE A MESSAGE INCLUDING: YOUR NAME DATE OF BIRTH CALL BACK NUMBER REASON FOR CALL**this is important as we prioritize the call backs  YOU WILL RECEIVE A CALL BACK THE SAME DAY AS LONG AS YOU CALL BEFORE 4:00 PM   Do the following things EVERYDAY: Weigh yourself in the morning before breakfast. Write it down and keep it in a log. Take your medicines as prescribed Eat low salt foods--Limit salt (sodium) to 2000 mg per day.  Stay as active as you can everyday Limit all fluids for the day to less than 2 liters   At the [facility], you and your health needs are our priority. As part of our continuing mission to provide you with exceptional heart care, we have created designated Provider Care Teams. These Care Teams include  your primary Cardiologist (physician) and Advanced Practice Providers (APPs- Physician Assistants and Nurse Practitioners) who all work together to provide you with the care you need, when you need it.   You may see any of the following providers on your designated Care Team at your next follow up: Dr Glori Bickers Dr Haynes Kerns, NP Lyda Jester, [place] Audry Riles, PharmD   Please be sure to bring in all your medications bottles to every appointment.

## 2021-01-19 ENCOUNTER — Encounter (HOSPITAL_COMMUNITY): Payer: Self-pay

## 2021-02-24 NOTE — Progress Notes (Incomplete)
ADVANCED HF CLINIC CONSULT NOTE  Primary Care: [name], MD Primary Cardiologist: Dr. Haroldine Laws  HPI: 65 y.o.AA male w/ h/o chronic systolic heart failure>>>HFimEF, PAD s/p bilateral BKA, HTN, Stage IIIa CKD, nonobstructive CAD and Type II DM. Legally blind left eye. H/o PAD/stump complications w/ h/o wounds. Seizure disorder.    Previously followed in the [facility] but not seen there since 2018. Had compliance issues and required paramedicine.    Echo 07/2015: EF 15%, RV severely reduced R/LHC 07/2015: minimal non-obstructive CAD, normal CO 6.01. CI 2.7    Echo 09/2016: EF 20-25% Echo 02/2017: EF 15-20%, G3DD, RV mild- mod reduced  Echo 06/2020: EF 50-55%, RV normal    Admitted to [facility] 10/22 w/ acute CHF. Recurrence in the setting of poor med compliance as he had stopped taking all meds. EF back down again, 20-25%, RV moderately reduced w/ mod-severe TR.  Diuresed w/ IV Lasix and GDMT restarted. D/w wt 206 lb. Discharged to SNF.   Seen in [facility] 10/22 for hospital follow up and was volume overloaded, ReDs 45%. Entresto started.  Readmitted 11/22 with A/C CHF. Given IV Lasix with improvement of symptoms. GDMT continued, hydralazine stopped with low BP. Weight 185 lbs.  Today he returns for post hospital HF follow up. Overall feeling fine. He remains at his facility. Gets around in his WC, not SOB with transfers, limited mostly by weakness. He has RLE prosthetic he is able to bear weight on, but no prosthetic for left. Denies CP, dizziness, edema, or PND/Orthopnea. Appetite fair, does not really like food at facility. No fever or chills.  Taking all medications provided by facility.  He informs me that he will remain a permanent resident at his current SNF. No plans to return home.   Cardiac Testing  Echo 5/17: EF 15%, RV severely reduced Echo 09/2016: EF 20-25% Echo 02/2017: EF 15-20%, G3DD, RV mild- mod reduced  Echo 06/2020: EF 50-55%, RV normal  Echo 10/22: EF 20-25%, RV moderately reduced  w/ mod-severe TR.   [facility] 5/17 Mid LAD lesion, 20% stenosed.   Findings:   Ao = 135/87 (107) LV = 141/20/30 RA = 17 RV = 67/11/20 PA = 62/29 (41) PCW = 35 (v wave 45-50) Fick cardiac output/index = 6.0/2.7 PVR = 1.0 WU SVR = 1198  FA sat = 88% PA sat = 58%, 57%   Assessment: 1. Minimal non-obstructive CAD 2. Severe NICM with EF 15% by echo 3. Markedly elevated filling pressures with normal PVR 4. Normal cardiac output  Review of Systems: [y] = yes, [ ]  = no   General: Weight gain [ ] ; Weight loss [ ] ; Anorexia Blue.Reese ]; Fatigue [ ] ; Fever [ ] ; Chills [ ] ; Weakness Blue.Reese ]  Cardiac: Chest pain/pressure [ ] ; Resting SOB [ ] ; Exertional SOB [ ] ; Orthopnea [ ] ; Pedal Edema [ ] ; Palpitations [ ] ; Syncope [ ] ; Presyncope [ ] ; Paroxysmal nocturnal dyspnea[ ]   Pulmonary: Cough [ ] ; Wheezing[ ] ; Hemoptysis[ ] ; Sputum [ ] ; Snoring [ ]   GI: Vomiting[ ] ; Dysphagia[ ] ; Melena[ ] ; Hematochezia [ ] ; Heartburn[ ] ; Abdominal pain [ ] ; Constipation [ ] ; Diarrhea [ ] ; BRBPR [ ]   GU: Hematuria[ ] ; Dysuria [ ] ; Nocturia[ ]   Vascular: Pain in legs with walking [ ] ; Pain in feet with lying flat [ ] ; Non-healing sores [ ] ; Stroke [ ] ; TIA [ ] ; Slurred speech Blue.Reese ];  Neuro: Headaches[ ] ; Vertigo[ ] ; Seizures[ ] ; Paresthesias[ ] ;Blurred vision [ ] ; Diplopia [ ] ; Vision  changes [ ]   Ortho/Skin: Arthritis [ ] ; Joint pain [ ] ; Muscle pain [ ] ; Joint swelling [ ] ; Back Pain [ ] ; Rash [ ]   Psych: Depression[ ] ; Anxiety[ ]   Heme: Bleeding problems [ ] ; Clotting disorders [ ] ; Anemia [ ]   Endocrine: Diabetes [ y]; Thyroid dysfunction[ ]   Past Medical History:  Diagnosis Date   Acute on chronic systolic heart failure, NYHA class 3 (HCC)    AKI (acute kidney injury) ([place])    Anemia    BKA stump complication (HCC)    Blind left eye    Blind left eye    Cardiomyopathy, dilated (HCC)    CHF (congestive heart failure) (HCC)    Combined systolic and diastolic congestive heart failure (HCC)    Congestive dilated  cardiomyopathy ([place]) 07/11/2015   Diabetes mellitus    Diabetic neuropathy associated with type 2 diabetes mellitus ([place])    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     aspirin 81 MG EC tablet Take 81 mg by mouth daily.     atorvastatin (LIPITOR) 40 MG tablet Take 40 mg by mouth at bedtime.      bisacodyl (DULCOLAX) 5 MG EC tablet Take 10 mg by mouth daily as needed for moderate constipation.     bisoprolol (ZEBETA) 5 MG tablet Take 1 tablet (5 mg total) by mouth daily. 30 tablet 1   divalproex (DEPAKOTE) 125 MG DR tablet Take 125 mg by mouth in the morning and at bedtime.     furosemide (LASIX) 20 MG tablet Take 1 tablet (20 mg total) by mouth 2 (two) times daily. 180 tablet 3   GLUCAGEN HYPOKIT 1 MG SOLR injection Inject 1 mg into the muscle once as needed for low blood sugar.     isosorbide mononitrate (IMDUR) 30 MG 24 hr tablet Take 1 tablet (30 mg total) by mouth daily.     levETIRAcetam (KEPPRA) 750 MG tablet Take 1 tablet (750 mg total) by mouth 2 (two) times daily. 60 tablet 0   Polyethyl Glycol-Propyl Glycol (SYSTANE) 0.4-0.3 % GEL ophthalmic gel Place 1 application into both eyes every 8 (eight) hours.     polyethylene glycol (MIRALAX / GLYCOLAX) packet Take 17 g by mouth 2 (two) times daily. 14 each 0   RHOPRESSA 0.02 % SOLN Place 1 drop into the left eye at bedtime.     sacubitril-valsartan (ENTRESTO) 24-26 MG Take 1 tablet by mouth 2 (two) times daily. 60 tablet 3   senna-docusate (SENOKOT-S) 8.6-50  MG tablet Take 1 tablet by mouth at bedtime.     sertraline (ZOLOFT) 50 MG tablet Take 50 mg by mouth in the morning.     spironolactone (ALDACTONE) 25 MG tablet Take 0.5 tablets (12.5 mg total) by mouth daily. 45 tablet 3   tamsulosin (FLOMAX) 0.4 MG CAPS capsule Take 2 capsules (0.8 mg total) by mouth daily after supper. 60 capsule 0   White Petrolatum-Mineral Oil (  ALTALUBE) 85-15 % OINT Place 1 application into both eyes at bedtime.     No current facility-administered medications for this visit.   No Known Allergies  Social History   Socioeconomic History   Marital status: Married    Spouse name: Not on file   Number of children: 0   Years of education: 12   Highest education level: Not on file  Occupational History   Not on file  Tobacco Use   Smoking status: Former    Years: 20.00    Types: Cigarettes    Quit date: 05/10/2000    Years since quitting: 20.8   Smokeless tobacco: Former    Quit date: 02/22/2000  Vaping Use   Vaping Use: Never used  Substance and Sexual Activity   Alcohol use: No    Alcohol/week: 0.0 standard drinks   Drug use: No   Sexual activity: Not on file  Other Topics Concern   Not on file  Social History Narrative   ** Merged History Encounter **       Lives alone.    Caffeine use: Tea/soda daily   Coffee sometimes    Left handed    Social Determinants of Health   Financial Resource Strain: Not on file  Food Insecurity: Not on file  Transportation Needs: Not on file  Physical Activity: Not on file  Stress: Not on file  Social Connections: Not on file  Intimate Partner Violence: Not on file   Family History  Problem Relation Age of Onset   Cancer Mother    Peripheral vascular disease Father    There were no vitals taken for this visit.  Wt Readings from Last 3 Encounters:  01/18/21 89.2 kg (196 lb 9.6 oz)  01/05/21 84.1 kg (185 lb 6.5 oz)  12/27/20 88.5 kg (195 lb)   PHYSICAL EXAM: General:  NAD. No resp difficulty, arrived in  [facility], chronically-ill appearing. HEENT: legally blind Neck: Supple. JVP 7-8. Carotids 2+ bilat; no bruits. No lymphadenopathy or thryomegaly appreciated. Cor: PMI nondisplaced. Regular rate & rhythm. No rubs, gallops or murmurs. Lungs: Clear Abdomen: Soft, nontender, nondistended. No hepatosplenomegaly. No bruits or masses. Good bowel sounds. Extremities: No cyanosis, clubbing, rash, edema; bilateral BKA, + RLE prosthesis Neuro: Alert & oriented x 3, cranial nerves grossly intact. Moves all 4 extremities w/o difficulty. Affect pleasant.  ECG: SB rBBB 59 bpm (personally reviewed).  ReDs: 33%  ASSESSMENT & PLAN:  1. Chronic Systolic Heart Failure - NICM - Echo 07/2015: EF 15%, RV severely reduced - R/LHC 07/2015: minimal non-obstructive CAD, normal CO 6.01. CI 2.7  - Echo 09/2016: EF 20-25% - Echo 02/2017: EF 15-20%, G3DD, RV mild- mod reduced  - Echo 06/2020: EF normalized 50-55%, RV normal  - Echo 10/22 EF back down 20-25% in setting of stopping meds  - Stable NYHA II, functional class limited due to primarily wheel-chair bound but no SOB with transfers. ReDs Clip, 33%. - Restart spiro 12.5 mg daily. - Reduce Lasix to 20 mg bid. - Continue Entresto 24-26 mg bid - Continue Bisoprolol 5 mg daily  - Continue Imdur 30, off hydralazine as of previous hospitalization. - Also consider future addition of SLGT2i (would need to coordinate w/ PCP given multidrug regimen) - Given h/o poor compliance, severe PAD s/p bilateral BKA w/ h/o stump complications/wound infections, blindness, wheel chair/ SNF dependent, he is not a candidate for advanced heart failure therapies. Hopefully EF will improve w/ better med compliance now that he has assistance at [facility]. Will  plan to see him back in [facility] clinic to continue w/ further med titration, then will plan to refer to cardiology for further management.  - Poor candidate for ICD given risk for infection w/ chronic stump wounds. Stump looks good today - BMET today,  repeat in 1 week and 4 weeks.   2. Stage IIIa CKD - SCr baseline ~1.5 - BMET today. - Follow renal function closely with addition on spiro.   3. HTN - Controlled on current regimen. - BMET today.   4. Non-obstructive CAD - LHC 2017 w/ minimal 1V non-obstructive CAD, 20% mid LAD - Stable w/o CP  - on ASA/statin/? blocker    5. PVD - s/p bilateral BKA - ASA/statin.    6. H/o Poor Compliance  - Previously require paramedicine - Now SNF resident where daily meds administered.    7. Type 2DM  - Well-controlled, Hgb A1c 6.1 - On insulin, metformin + januvia - May benefit from CV standpoint from addition of SLGT2i (would need to coordinate w/ PCP given multidrug regimen). - Monitor left stump for wounds.  Follow up in 6 weeks with APP for further medication titration. Plan to repeat echo in 3 months when GDMT optimized.  Allena Katz, FNP-BC 02/24/21

## 2021-02-28 ENCOUNTER — Encounter (HOSPITAL_COMMUNITY): Payer: Medicaid Other

## 2021-03-16 NOTE — Progress Notes (Signed)
See IM Resident note that has been addended.

## 2021-03-17 ENCOUNTER — Encounter: Payer: Self-pay | Admitting: Internal Medicine

## 2021-03-17 ENCOUNTER — Ambulatory Visit (INDEPENDENT_AMBULATORY_CARE_PROVIDER_SITE_OTHER): Payer: Medicaid Other | Admitting: Internal Medicine

## 2021-03-17 ENCOUNTER — Other Ambulatory Visit: Payer: Self-pay

## 2021-03-17 VITALS — BP 110/78 | HR 74 | Ht 66.0 in

## 2021-03-17 DIAGNOSIS — I5020 Unspecified systolic (congestive) heart failure: Secondary | ICD-10-CM

## 2021-03-17 DIAGNOSIS — I5042 Chronic combined systolic (congestive) and diastolic (congestive) heart failure: Secondary | ICD-10-CM | POA: Diagnosis not present

## 2021-03-17 MED ORDER — BISOPROLOL FUMARATE 10 MG PO TABS: 10.0000 mg | ORAL_TABLET | Freq: Every day | ORAL | 3 refills | Status: AC

## 2021-03-17 MED ORDER — FUROSEMIDE 40 MG PO TABS
40.0000 mg | ORAL_TABLET | Freq: Every day | ORAL | 3 refills | Status: AC
Start: 2021-03-17 — End: 2021-07-02

## 2021-03-17 NOTE — Patient Instructions (Signed)
Medication Instructions:  Your physician has recommended you make the following change in your medication:  INCREASE BISOPROLOL TO 10 MG DAILY  CHANGE LASIX TO 40 MG DAILY *If you need a refill on your cardiac medications before your next appointment, please call your pharmacy*   Lab Work: TODAY: BNP, BMET, CBC, ANEMIA PANEL If you have labs (blood work) drawn today and your tests are completely normal, you will receive your results only by: [place] (if you have MyChart) OR A paper copy in the mail If you have any lab test that is abnormal or we need to change your treatment, we will call you to review the results.   Testing/Procedures: NONE   Follow-Up: KEEP SCHEDULED FOLLOW-UP

## 2021-03-17 NOTE — Progress Notes (Addendum)
Cardiology Office Note:    Date:  03/17/2021   ID:  [name], DOB [birth year], MRN [id number]  PCP:  [name], MD   [facility] Providers Cardiologist:  Werner Lean, MD     Referring MD: [name], MD   HFrEF follow up   History of Present Illness:    [name] is a 66 y.o. 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 In interim of ED visit, patient started MRA.  Patient notes that he is doing well at living facility.  Feels that he has been doing well since more recent hospitalization in October for HFrEF.  Missed 02/28/21 APP visit.  Has  app with AHF and follow up echo 04/12/21.  No chest pain or pressure.   No SOB (helps with transfers but does not ambulate) and no PND/Orthopnea.  Believes he has lost weight at facility. He has b/l BKAs. No palpitations or syncope. He is blind in left eye and has had some frequent falls, mostly occur with transferring from wheelchair to bed.     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   Left body jerking   Hypertension    Neuropathy    Noncompliance with medications    Open knee wound 10/2015   on rt bka   PAD (peripheral artery disease) (HCC)    Protein calorie malnutrition ([place])    Shortness of breath dyspnea      Past Surgical History:  Procedure Laterality Date   ABDOMINAL AORTOGRAM W/LOWER EXTREMITY N/A 03/25/2017   Procedure: ABDOMINAL AORTOGRAM W/LOWER EXTREMITY;  Surgeon: Angelia Mould, MD;  Location: [place] [facility];  Service: Cardiovascular;  Laterality: N/A;   AMPUTATION Right 09/26/2013   Procedure: AMPUTATION BELOW KNEE;  Surgeon: Rozanna Box, MD;  Location: [place];  Service: Orthopedics;  Laterality: Right;   AMPUTATION Right 05/01/2015   Procedure: REVISION OF RIGHT TRANSTIBIAL AMPUTATION ;  Surgeon: Newt Minion, MD;  Location: [place];  Service: Orthopedics;  Laterality: Right;   AMPUTATION Left 06/24/2020   Procedure: LEFT BELOW KNEE AMPUTATION;  Surgeon: Newt Minion, MD;  Location: [place];  Service: Orthopedics;  Laterality: Left;   APPLICATION OF WOUND VAC  08/10/2016   Procedure: APPLICATION OFI NCISONAL  WOUND VAC;  Surgeon: Newt Minion, MD;  Location: [place];  Service: Orthopedics;;   CARDIAC CATHETERIZATION N/A 07/13/2015   Procedure: Right/Left Heart Cath and Coronary Angiography;  Surgeon: Jolaine Artist, MD;  Location: [facility];  Service: Cardiovascular;  Laterality: N/A;   COLONOSCOPY     I & D EXTREMITY Right 09/24/2013   Procedure: IRRIGATION AND DEBRIDEMENT RIGHT FOOT ULCER;  Surgeon: Ernesta Amble  Percell Miller, MD;  Location: [place];  Service: Orthopedics;  Laterality: Right;   I & D EXTREMITY Right 09/26/2013   Procedure: Repeat IRRIGATION AND DEBRIDEMENT Right Foot Ulcer;  Surgeon: Rozanna Box, MD;  Location: [facility];  Service: Orthopedics;  Laterality: Right;   MULTIPLE TOOTH EXTRACTIONS     SKIN SPLIT GRAFT Right 08/10/2016   Procedure: SKIN GRAFT SPLIT THICKNESS RIGHT LEG;  Surgeon: Newt Minion, MD;  Location: [place];  Service: Orthopedics;  Laterality: Right;   STUMP REVISION Right 04/20/2015   Procedure: Revision Right Below Knee Amputation, Apply Wound VAC;  Surgeon: Newt Minion, MD;  Location: [place];  Service: Orthopedics;  Laterality: Right;    TONSILLECTOMY      Current Medications: Current Meds  Medication Sig   acetaminophen (TYLENOL) 325 MG tablet Take 650 mg by mouth every 6 (six) hours as needed for mild pain (AND CANNOT EXCEED 3,000 MG/24 HOURS OF TYLENOL FROM ALL COMBINED SOURCES).   aspirin 81 MG EC tablet Take 81 mg by mouth daily.   atorvastatin (LIPITOR) 40 MG tablet Take 40 mg by mouth at bedtime.    bisacodyl (DULCOLAX) 5 MG EC tablet Take 10 mg by mouth daily as needed for moderate constipation.   bisoprolol (ZEBETA) 10 MG tablet Take 1 tablet (10 mg total) by mouth daily.   divalproex (DEPAKOTE) 125 MG DR tablet Take 125 mg by mouth in the morning and at bedtime.   empagliflozin (JARDIANCE) 10 MG TABS tablet Take by mouth daily.   furosemide (LASIX) 40 MG tablet Take 1 tablet (40 mg total) by mouth daily.   GLUCAGEN HYPOKIT 1 MG SOLR injection Inject 1 mg into the muscle once as needed for low blood sugar.   isosorbide mononitrate (IMDUR) 30 MG 24 hr tablet Take 1 tablet (30 mg total) by mouth daily.   levETIRAcetam (KEPPRA) 750 MG tablet Take 1 tablet (750 mg total) by mouth 2 (two) times daily.   Polyethyl Glycol-Propyl Glycol (SYSTANE) 0.4-0.3 % GEL ophthalmic gel Place 1 application into both eyes every 8 (eight) hours.   polyethylene glycol (MIRALAX / GLYCOLAX) packet Take 17 g by mouth 2 (two) times daily.   RHOPRESSA 0.02 % SOLN Place 1 drop into the left eye at bedtime.   sacubitril-valsartan (ENTRESTO) 24-26 MG Take 1 tablet by mouth 2 (two) times daily.   senna-docusate (SENOKOT-S) 8.6-50 MG tablet Take 1 tablet by mouth at bedtime.   sertraline (ZOLOFT) 50 MG tablet Take 50 mg by mouth in the morning.   sitaGLIPtin (JANUVIA) 100 MG tablet Take 100 mg by mouth daily.   spironolactone (ALDACTONE) 25 MG tablet Take 0.5 tablets (12.5 mg total) by mouth daily.   tamsulosin (FLOMAX) 0.4 MG CAPS capsule Take 2 capsules (0.8 mg total) by mouth daily after supper.   White Petrolatum-Mineral Oil (ALTALUBE)  85-15 % OINT Place 1 application into both eyes at bedtime.   [DISCONTINUED] bisoprolol (ZEBETA) 5 MG tablet Take 1 tablet (5 mg total) by mouth daily.   [DISCONTINUED] furosemide (LASIX) 20 MG tablet Take 1 tablet (20 mg total) by mouth 2 (two) times daily.     Allergies:   Patient has no known allergies.   Social History   Socioeconomic History   Marital status: Married    Spouse name: Not on file   Number of children: 0   Years of education: 12   Highest education level: Not on file  Occupational History   Not on file  Tobacco Use  Smoking status: Former    Years: 20.00    Types: Cigarettes    Quit date: 05/10/2000    Years since quitting: 20.8   Smokeless tobacco: Former    Quit date: 02/22/2000  Vaping Use   Vaping Use: Never used  Substance and Sexual Activity   Alcohol use: No    Alcohol/week: 0.0 standard drinks   Drug use: No   Sexual activity: Not on file  Other Topics Concern   Not on file  Social History Narrative   ** Merged History Encounter **       Lives alone.    Caffeine use: Tea/soda daily   Coffee sometimes    Left handed    Social Determinants of Health   Financial Resource Strain: Not on file  Food Insecurity: Not on file  Transportation Needs: Not on file  Physical Activity: Not on file  Stress: Not on file  Social Connections: Not on file     Family History: The patient's family history includes Cancer in his mother; Peripheral vascular disease in his father.  ROS:   Please see the history of present illness.     All other systems reviewed and are negative.  EKGs/Labs/Other Studies Reviewed:    The following studies were reviewed today:   Transthoracic Echocardiogram: Date: 12/18/20 Results:  1. Left ventricular ejection fraction, by estimation, is 20 to 25%. The  left ventricle has severely decreased function. The left ventricle  demonstrates global hypokinesis. There is mild concentric left ventricular  hypertrophy. Left  ventricular diastolic   parameters are consistent with Grade III diastolic dysfunction  (restrictive). Elevated left ventricular end-diastolic pressure. The E/e'  is 29.6.   2. Right ventricular systolic function is moderately reduced. The right  ventricular size is severely enlarged. There is moderately elevated  pulmonary artery systolic pressure. The estimated right ventricular  systolic pressure is 65.0 mmHg.   3. Left atrial size was mildly dilated.   4. Right atrial size was mild to moderately dilated.   5. The mitral valve is grossly normal. Mild mitral valve regurgitation.  No evidence of mitral stenosis.   6. Tricuspid valve regurgitation is moderate to severe.   7. The aortic valve is tricuspid. Aortic valve regurgitation is not  visualized. No aortic stenosis is present.   8. The inferior vena cava is dilated in size with >50% respiratory  variability, suggesting right atrial pressure of 8 mmHg.   Comparison(s): Changes from prior study are noted. The left ventricular  function is significantly worse.   Left/Right Heart Catheterizations: Date: 2017  Results: Mid LAD lesion, 20% stenosed.   Findings:   Ao = 135/87 (107) LV = 141/20/30 RA = 17 RV = 67/11/20 PA = 62/29 (41) PCW = 35 (v wave 45-50) Fick cardiac output/index = 6.0/2.7 PVR = 1.0 WU SVR = 1198  FA sat = 88% PA sat = 58%, 57%   Assessment: 1. Minimal non-obstructive CAD 2. Severe NICM with EF 15% by echo 3. Markedly elevated filling pressures with normal PVR 4. Normal cardiac output   Plan/Discussion:   He has severe NICM. Still markedly volume overload. Continue diuresis. Pulmonary HTN is all left-sided. PA pressures should normalize with diuresis.    Recent Labs: 12/19/2020: TSH 1.129 12/29/2020: B Natriuretic Peptide 2,475.4 12/31/2020: ALT 30 01/05/2021: Magnesium 2.3 01/18/2021: BUN 57; Creatinine, Ser 2.11; Hemoglobin 12.6; Platelets 385; Potassium 4.0; Sodium 138  Recent Lipid Panel     Component Value Date/Time   CHOL 106 12/17/2020 2015  TRIG 45 12/17/2020 2015   HDL 48 12/17/2020 2015   CHOLHDL 2.2 12/17/2020 2015   VLDL 9 12/17/2020 2015   LDLCALC 49 12/17/2020 2015   LDLDIRECT 100 (H) 04/07/2007 2115        Physical Exam:    VS:  BP 110/78    Pulse 74    Ht 5\' 6"  (1.676 m)    SpO2 95%    BMI 31.73 kg/m     Wt Readings from Last 3 Encounters:  01/18/21 196 lb 9.6 oz (89.2 kg)  01/05/21 185 lb 6.5 oz (84.1 kg)  12/27/20 195 lb (88.5 kg)    Gen: Chronically ill appearing African-American male in no distress, blind in left eye  Neck: prominent JVD with a low nadir, no carotid bruit Ears: + Pilar Plate Sign Cardiac: No Rubs or Gallops, II/VI holosystolic murmur RRR, 2+ radial pulses, distant heart sounds Respiratory: Clear to auscultation bilaterally,  normal respiratory rate GI: Soft, nontender, non-distended  MS: B/l BKA;  moves all extremities Integument: Skin feels warm an dry, there is no drainage from left amputation site Neuro:  At time of evaluation, alert and answers questions appropriately  Psych: Normal affect   ASSESSMENT:    1. Chronic combined systolic and diastolic congestive heart failure (HCC)   2. HFrEF (heart failure with reduced ejection fraction) (HCC)    PLAN:    Heart Failure with reduced Ejection Fraction  PAD s/p Bilateral BKA and chronic stumb wound infections HTN with CKD Stage IIIa and DM  L eye blindness - NYHA class I-II, Stage C-D, euvolemic, etiology from NICM - Diuretic regimen: Lasix; will change from 20 mg BID to 40 mg PO once daily  - Discussed the importance of fluid restriction of < 2 L, salt restriction, and checking daily weights  - Replace electrolytes PRN and keep K>4 -  BMP/BNP/CBC/anemia panel  - Increase Bisprolol to 10 mg daily  - Continue Entresto, aldactone, jardiance  - isordil/hydralazine for afterload reduction  -F/u with advanced heart failure team in February, reaching out to AHF today and may end  up seeing gen cards based on future eval (happy to see in follow up) - on ASA 81 mg PO daily and atorvastatin 40 mg in the setting of PAD - on IMDUR 30 mg; at next visit may attempt to decrease this to see if BP room for uptitration of ARNI - may increase aldactone based on labs today, may add iron based on labs today         Medication Adjustments/Labs and Tests Ordered: Current medicines are reviewed at length with the patient today.  Concerns regarding medicines are outlined above.  Orders Placed This Encounter  Procedures   Basic metabolic panel   CBC   Pro b natriuretic peptide (BNP)   Vitamin B12   Folate   Iron and TIBC   Ferritin    Meds ordered this encounter  Medications   bisoprolol (ZEBETA) 10 MG tablet    Sig: Take 1 tablet (10 mg total) by mouth daily.    Dispense:  90 tablet    Refill:  3   furosemide (LASIX) 40 MG tablet    Sig: Take 1 tablet (40 mg total) by mouth daily.    Dispense:  90 tablet    Refill:  3     Patient Instructions  Medication Instructions:  Your physician has recommended you make the following change in your medication:  INCREASE BISOPROLOL TO 10 MG DAILY  CHANGE LASIX TO  40 MG DAILY *If you need a refill on your cardiac medications before your next appointment, please call your pharmacy*   Lab Work: TODAY: BNP, BMET, CBC, ANEMIA PANEL If you have labs (blood work) drawn today and your tests are completely normal, you will receive your results only by: [place] (if you have MyChart) OR A paper copy in the mail If you have any lab test that is abnormal or we need to change your treatment, we will call you to review the results.   Testing/Procedures: NONE   Follow-Up: KEEP SCHEDULED FOLLOW-UP   Signed, Sharion Settler, DO  03/17/2021 4:51 PM    [facility]  Personally seen and examined. Agree with resident above. I have edited the final subjective, objective, assessment and plan sections  prior to signing.  Rudean Haskell, MD [address], [address] [place], [place] [address] [phone]  5:10 PM

## 2021-03-18 LAB — BASIC METABOLIC PANEL
BUN/Creatinine Ratio: 29 — ABNORMAL HIGH (ref 10–24)
BUN: 45 mg/dL — ABNORMAL HIGH (ref 8–27)
CO2: 21 mmol/L (ref 20–29)
Calcium: 8.9 mg/dL (ref 8.6–10.2)
Chloride: 108 mmol/L — ABNORMAL HIGH (ref 96–106)
Creatinine, Ser: 1.55 mg/dL — ABNORMAL HIGH (ref 0.76–1.27)
Glucose: 108 mg/dL — ABNORMAL HIGH (ref 70–99)
Potassium: 4.9 mmol/L (ref 3.5–5.2)
Sodium: 143 mmol/L (ref 134–144)
eGFR: 50 mL/min/{1.73_m2} — ABNORMAL LOW (ref >59)

## 2021-03-18 LAB — CBC
Hematocrit: 46.1 % (ref 37.5–51.0)
Hemoglobin: 14 g/dL (ref 13.0–17.7)
MCH: 22.8 pg — ABNORMAL LOW (ref 26.6–33.0)
MCHC: 30.4 g/dL — ABNORMAL LOW (ref 31.5–35.7)
MCV: 75 fL — ABNORMAL LOW (ref 79–97)
Platelets: 326 10*3/uL (ref 150–450)
RBC: 6.14 x10E6/uL — ABNORMAL HIGH (ref 4.14–5.80)
RDW: 18.3 % — ABNORMAL HIGH (ref 11.6–15.4)
WBC: 7.6 10*3/uL (ref 3.4–10.8)

## 2021-03-18 LAB — IRON AND TIBC
Iron Saturation: 8 % — CL (ref 15–55)
Iron: 27 ug/dL — ABNORMAL LOW (ref 38–169)
Total Iron Binding Capacity: 355 ug/dL (ref 250–450)
UIBC: 328 ug/dL (ref 111–343)

## 2021-03-18 LAB — PRO B NATRIURETIC PEPTIDE: NT-Pro BNP: 30670 pg/mL — ABNORMAL HIGH (ref 0–210)

## 2021-03-18 LAB — FOLATE: Folate: 5.9 ng/mL (ref >3.0)

## 2021-03-18 LAB — VITAMIN B12: Vitamin B-12: 851 pg/mL (ref 232–1245)

## 2021-03-18 LAB — FERRITIN: Ferritin: 34 ng/mL (ref 30–400)

## 2021-03-21 ENCOUNTER — Encounter (HOSPITAL_COMMUNITY): Payer: Self-pay | Admitting: Emergency Medicine

## 2021-03-21 ENCOUNTER — Emergency Department (HOSPITAL_COMMUNITY)
Admission: EM | Admit: 2021-03-21 | Discharge: 2021-03-22 | Disposition: A | Discharge status: Skilled Nursing Facility | Payer: Medicaid Other | Attending: Emergency Medicine | Admitting: Emergency Medicine

## 2021-03-21 ENCOUNTER — Telehealth: Payer: Self-pay | Admitting: Internal Medicine

## 2021-03-21 ENCOUNTER — Emergency Department (HOSPITAL_COMMUNITY): Payer: Medicaid Other

## 2021-03-21 DIAGNOSIS — R0602 Shortness of breath: Secondary | ICD-10-CM | POA: Insufficient documentation

## 2021-03-21 DIAGNOSIS — E1121 Type 2 diabetes mellitus with diabetic nephropathy: Secondary | ICD-10-CM | POA: Insufficient documentation

## 2021-03-21 DIAGNOSIS — Z20822 Contact with and (suspected) exposure to covid-19: Secondary | ICD-10-CM | POA: Insufficient documentation

## 2021-03-21 DIAGNOSIS — I110 Hypertensive heart disease with heart failure: Secondary | ICD-10-CM | POA: Diagnosis not present

## 2021-03-21 DIAGNOSIS — I509 Heart failure, unspecified: Secondary | ICD-10-CM

## 2021-03-21 DIAGNOSIS — R899 Unspecified abnormal finding in specimens from other organs, systems and tissues: Secondary | ICD-10-CM

## 2021-03-21 DIAGNOSIS — I5023 Acute on chronic systolic (congestive) heart failure: Secondary | ICD-10-CM | POA: Diagnosis not present

## 2021-03-21 DIAGNOSIS — R799 Abnormal finding of blood chemistry, unspecified: Secondary | ICD-10-CM | POA: Insufficient documentation

## 2021-03-21 LAB — COMPREHENSIVE METABOLIC PANEL
ALT: 16 U/L (ref 0–44)
AST: 15 U/L (ref 15–41)
Albumin: 2.2 g/dL — ABNORMAL LOW (ref 3.5–5.0)
Alkaline Phosphatase: 78 U/L (ref 38–126)
Anion gap: 6 (ref 5–15)
BUN: 42 mg/dL — ABNORMAL HIGH (ref 8–23)
CO2: 25 mmol/L (ref 22–32)
Calcium: 8 mg/dL — ABNORMAL LOW (ref 8.9–10.3)
Chloride: 107 mmol/L (ref 98–111)
Creatinine, Ser: 1.6 mg/dL — ABNORMAL HIGH (ref 0.61–1.24)
GFR, Estimated: 48 mL/min — ABNORMAL LOW (ref >60)
Glucose, Bld: 94 mg/dL (ref 70–99)
Potassium: 3.5 mmol/L (ref 3.5–5.1)
Sodium: 138 mmol/L (ref 135–145)
Total Bilirubin: 0.9 mg/dL (ref 0.3–1.2)
Total Protein: 5.2 g/dL — ABNORMAL LOW (ref 6.5–8.1)

## 2021-03-21 LAB — TROPONIN I (HIGH SENSITIVITY)
Troponin I (High Sensitivity): 213 ng/L (ref <18)
Troponin I (High Sensitivity): 195 ng/L (ref <18)

## 2021-03-21 LAB — CBC WITH DIFFERENTIAL/PLATELET
Abs Immature Granulocytes: 0.02 10*3/uL (ref 0.00–0.07)
Basophils Absolute: 0.1 10*3/uL (ref 0.0–0.1)
Basophils Relative: 1 %
Eosinophils Absolute: 0.1 10*3/uL (ref 0.0–0.5)
Eosinophils Relative: 1 %
HCT: 43.4 % (ref 39.0–52.0)
Hemoglobin: 13.1 g/dL (ref 13.0–17.0)
Immature Granulocytes: 0 %
Lymphocytes Relative: 12 %
Lymphs Abs: 0.9 10*3/uL (ref 0.7–4.0)
MCH: 23.1 pg — ABNORMAL LOW (ref 26.0–34.0)
MCHC: 30.2 g/dL (ref 30.0–36.0)
MCV: 76.5 fL — ABNORMAL LOW (ref 80.0–100.0)
Monocytes Absolute: 1 10*3/uL (ref 0.1–1.0)
Monocytes Relative: 13 %
Neutro Abs: 5.6 10*3/uL (ref 1.7–7.7)
Neutrophils Relative %: 73 %
Platelets: 286 10*3/uL (ref 150–400)
RBC: 5.67 MIL/uL (ref 4.22–5.81)
RDW: 19.6 % — ABNORMAL HIGH (ref 11.5–15.5)
WBC: 7.6 10*3/uL (ref 4.0–10.5)
nRBC: 0.3 % — ABNORMAL HIGH (ref 0.0–0.2)

## 2021-03-21 LAB — BRAIN NATRIURETIC PEPTIDE: B Natriuretic Peptide: >4500 pg/mL — ABNORMAL HIGH (ref 0.0–100.0)

## 2021-03-21 LAB — RESP PANEL BY RT-PCR (FLU A&B, COVID) ARPGX2
Influenza A by PCR: NEGATIVE
Influenza B by PCR: NEGATIVE
SARS Coronavirus 2 by RT PCR: NEGATIVE

## 2021-03-21 NOTE — Progress Notes (Signed)
Sister at bedside. Provided  listening, presence,spiritual and emotional support to both patient and sister. Sister requested prayer for herself and patient.  Prayer was done.  Chaplain available as needed.  Jaclynn Major, [place], [facility] Pager [phone]

## 2021-03-21 NOTE — ED Provider Notes (Signed)
3:38 PM Pt is a 66 yo male presenting from nursing home for abnormal labs, bnp. Pt has hx of heart failure with EF 20-15% with chronically elevated BNP and trops. Asymptomatic. Euvolemic.   Referred to advanced heart failure service.   Plan:  Pending chemistry and repeat trop DC to nursing home if stable.   Physical Exam  BP 103/82    Pulse 67    Temp 97.6 F (36.4 C)    Resp 15    SpO2 99%   Physical Exam Vitals and nursing note reviewed.  Constitutional:      Appearance: Normal appearance.  HENT:     Head: Normocephalic.  Cardiovascular:     Rate and Rhythm: Normal rate.  Pulmonary:     Effort: No respiratory distress.  Neurological:     Mental Status: He is alert. Mental status is at baseline.     GCS: GCS eye subscore is 4. GCS verbal subscore is 5. GCS motor subscore is 6.    Procedures  Procedures  ED Course / MDM    Medical Decision Making Amount and/or Complexity of Data Reviewed Labs: ordered.   4:59 PM Troponin down trending.  Stable electrolytes.  Patient in no distress and overall condition improved here in the ED. Detailed discussions were had with the patient regarding current findings, and need for close f/u with Heart Failure clinic. The patient has been instructed to return immediately if the symptoms worsen in any way for re-evaluation. Patient verbalized understanding and is in agreement with current care plan. All questions answered prior to discharge.        Campbell Stall P, DO [phone]/73/57 [id number]

## 2021-03-21 NOTE — ED Triage Notes (Signed)
Troponin -213

## 2021-03-21 NOTE — ED Provider Notes (Signed)
Emergency Department Provider Note   I have reviewed the triage vital signs and the nursing notes.   HISTORY  Chief Complaint Abnormal Lab   HPI [name] is a 66 y.o. male with PMH of NICM (EF 15%), DM, and PAD presents to the emergency department for evaluation of abnormal blood work.  Patient denies any symptoms currently.  States overall he is feeling well.  He denies shortness of breath or chest discomfort.  He reports that he "sometimes" takes his medication. Review of MAR at bedside describes "partial administration" of many medications in the last 2-3 days but no other detail regarding what that actually means. Patient tells me he mainly takes his medicines when he feels sick. Labs performed as an outpatient reportedly showed significantly elevated BNP and mildly elevated troponin and so he was sent in for evaluation.    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 Respiratory: Denies shortness of breath. Gastrointestinal: No abdominal pain.   Skin: Negative for rash. Neurological: Negative for headaches, focal weakness or  numbness.  ____________________________________________   PHYSICAL EXAM:  VITAL SIGNS: ED Triage Vitals  Enc Vitals Group     BP 03/21/21 1035 101/86     Pulse Rate 03/21/21 1035 72     Resp 03/21/21 1035 18     Temp 03/21/21 1035 97.6 F (36.4 C)     Temp src --      SpO2 03/21/21 1035 96 %   Constitutional: Alert. Well appearing and in no acute distress. Eyes: Conjunctivae are normal. Head: Atraumatic. Nose: No congestion/rhinnorhea. Mouth/Throat: Mucous membranes are moist.  Oropharynx non-erythematous. Neck: No stridor.  Cardiovascular: Normal rate, regular rhythm. Good peripheral circulation. Grossly normal heart sounds.   Respiratory: Normal respiratory effort.  No retractions. Lungs CTAB. Gastrointestinal: Soft and nontender. Mild distention.  Musculoskeletal: No lower extremity tenderness with trace edema in the bilateral LEs (BKAs bilaterally).  Neurologic:  Normal speech and language. No gross focal neurologic deficits are appreciated.  Skin:  Skin is warm, dry and intact. No rash noted.  ____________________________________________   LABS (all labs ordered are listed, but only abnormal results are displayed)  Labs Reviewed  BRAIN NATRIURETIC PEPTIDE - Abnormal; Notable for the following components:      Result Value   B Natriuretic Peptide >4,500.0 (*)    All other components within normal limits  CBC WITH DIFFERENTIAL/PLATELET - Abnormal; Notable for the following components:   MCV 76.5 (*)    MCH 23.1 (*)    RDW 19.6 (*)  nRBC 0.3 (*)    All other components within normal limits  COMPREHENSIVE METABOLIC PANEL - Abnormal; Notable for the following components:   BUN 42 (*)    Creatinine, Ser 1.60 (*)    Calcium 8.0 (*)    Total Protein 5.2 (*)    Albumin 2.2 (*)    GFR, Estimated 48 (*)    All other components within normal limits  TROPONIN I (HIGH SENSITIVITY) - Abnormal; Notable for the following components:   Troponin I (High Sensitivity) 213  (*)    All other components within normal limits  TROPONIN I (HIGH SENSITIVITY) - Abnormal; Notable for the following components:   Troponin I (High Sensitivity) 195 (*)    All other components within normal limits  RESP PANEL BY RT-PCR (FLU A&B, COVID) [id number]   ____________________________________________  EKG   EKG Interpretation  Date/Time:  Tuesday March 21 2021 10:36:59 EST Ventricular Rate:  65 PR Interval:  206 QRS Duration: 98 QT Interval:  446 QTC Calculation: 463 R Axis:   -43 Text Interpretation: Normal sinus rhythm Left axis deviation Left ventricular hypertrophy with repolarization abnormality ( R in aVL ) Inferior infarct , age undetermined Abnormal ECG When compared with ECG of 18-Jan-2021 11:17, PREVIOUS ECG IS PRESENT Similar to Nov 2022 tracing Confirmed by Nanda Quinton [phone]) on 03/21/2021 1:09:35 PM        ____________________________________________  RADIOLOGY  DG Chest 2 View  Result Date: 03/21/2021 CLINICAL DATA:  Shortness of breath. EXAM: CHEST - 2 VIEW COMPARISON:  12/29/2020 FINDINGS: Stable moderate cardiomegaly. There is focal patchy opacity in the LEFT lung base. Small bilateral pleural effusions. No evidence for pulmonary edema. IMPRESSION: Stable moderate cardiomegaly. LEFT LOWER lobe atelectasis or early infiltrate. Small bilateral pleural effusions. Electronically Signed   By: Nolon Nations M.D.   On: 03/21/2021 11:23    ____________________________________________   PROCEDURES  Procedure(s) performed:   Procedures  None  ____________________________________________   INITIAL IMPRESSION / ASSESSMENT AND PLAN / ED COURSE  Pertinent labs & imaging results that were available during my care of the patient were reviewed by me and considered in my medical decision making (see chart for details).   This patient is Presenting for Evaluation of abnormal labs, which does require a range of treatment options, and is a complaint that  involves a high risk of morbidity and mortality.  The Differential Diagnoses include lab error, decompensated CHF, ACS, PE, pericardial effusion, medication non-compliance.  Critical Interventions- IV access and evaluation with review of prior ECHO and recent Cardiology notes.    Reassessment after intervention: Continues to look well with normal vital signs.    I did obtain Additional Historical Information from EMS and review of their documentation.   I decided to review pertinent External Data, and in summary prior ECHO and Cardiology note from 03/17/20 described as euvolemic at that time.   Clinical Laboratory Tests Ordered, included BNP greater than 4500.  Troponin of 213.  CMP pending.  CBC without leukocytosis or anemia.   Radiologic Tests Ordered, included chest x-ray independently evaluated by me.  Stable cardiomegaly.  No severe pulmonary edema.   Cardiac Monitor Tracing which shows NSR.    Social Determinants of Health Risk SNF patient with poor health literacy.   Reevaluation with update and discussion with patient. He continues to feel well. No hypotension or hypoxemia.   Consult complete with Cardiology.   Spoke with Dr. Gasper Sells with Cardiology who evaluated him in the office on 11/13.  Reviewed  the lab abnormalities of concern from the patient's nursing facility.  He advises that the patient, given his severe systolic CHF, as a baseline BNP that is elevated in this range.  Troponin elevation also to be expected.  Patient remains euvolemic here.  He is referring him to advanced heart failure team but will not likely have advanced heart failure options other than aggressive medical management.   03:15 PM Repeat troponin and CMP pending. Care transferred to Dr. Pearline Cables.   Disposition: pending   ____________________________________________  FINAL CLINICAL IMPRESSION(S) / ED DIAGNOSES  Final diagnoses:  Abnormal laboratory test  Chronic congestive heart failure,  unspecified heart failure type [facility])    Note:  This document was prepared using Dragon voice recognition software and may include unintentional dictation errors.  Nanda Quinton, MD, [facility] Emergency Medicine    Kenitra Leventhal, [facility], MD 03/22/21 [phone]

## 2021-03-21 NOTE — Discharge Instructions (Signed)
You were seen in the emergency room today with abnormal blood test.  Discussed the case with your cardiologist by phone who confirmed that these lab results are normal for you and reflect the degree of your congestive heart failure.  He would like for you to continue your medications as prescribed.  He would also like for you to follow with the heart failure clinic at your appointment in February.  He will continue to follow with you in the clinic as well.  If you develop trouble breathing, chest discomfort, or sudden worsening fluid retention please return to the emergency department or call your cardiologist.

## 2021-03-21 NOTE — ED Triage Notes (Signed)
EMS stated, He is here for abnormal . Stated BUN was 5000

## 2021-03-21 NOTE — Telephone Encounter (Signed)
Advised sister Bethena Roys that I can not release information to her; d/t name not on dpr.  [facility] office staff  will mail a dpr form to pt to be completed and returned. Sister expresses that pt was sent to the hospital today by facility due to kidneys.

## 2021-03-21 NOTE — Telephone Encounter (Signed)
Patient's sister Bethena Roys is returning Shamea's call from yesterday in regards to pt's lab results.

## 2021-03-21 NOTE — ED Notes (Signed)
Dinner ordered 

## 2021-03-21 NOTE — ED Notes (Signed)
[facility] called to transport patient, 13th on the list

## 2021-03-21 NOTE — ED Provider Triage Note (Signed)
Emergency Medicine Provider Triage Evaluation Note  [name] , a 66 y.o. male  was evaluated in triage.  From facility with complaint of abnormal lab.  Patient states that he is unsure why he is here.  He states that he was brought here for "something about his heart" appears in triage note that he had elevated BUN; however believe this to be BNP.  Patient denies chest pain, shortness of breath, abdominal pain, nausea, vomiting, headache or lightheadedness.  Denies recent fevers, cough or upper respiratory symptoms.  Review of Systems  Positive: See above Negative:   Physical Exam  BP 101/86 (BP Location: Left Arm)    Pulse 72    Temp 97.6 F (36.4 C)    Resp 18    SpO2 96%  Gen:   Awake, no distress   Resp:  Normal effort, Rales on the left lung MSK:   Moves extremities without difficulty  Other:    Medical Decision Making  Medically screening exam initiated at 10:59 AM.  Appropriate orders placed.  [name] was informed that the remainder of the evaluation will be completed by another provider, this initial triage assessment does not replace that evaluation, and the importance of remaining in the ED until their evaluation is complete.     Mickie Hillier, PA-C 03/21/21 1101

## 2021-03-30 ENCOUNTER — Encounter: Payer: Self-pay | Admitting: Internal Medicine

## 2021-03-30 ENCOUNTER — Other Ambulatory Visit: Payer: Self-pay

## 2021-03-30 ENCOUNTER — Ambulatory Visit: Payer: Medicaid Other | Admitting: Internal Medicine

## 2021-03-30 VITALS — BP 118/76 | HR 73 | Ht 66.0 in

## 2021-03-30 DIAGNOSIS — I5020 Unspecified systolic (congestive) heart failure: Secondary | ICD-10-CM | POA: Diagnosis not present

## 2021-03-30 MED ORDER — FERROUS SULFATE 325 (65 FE) MG PO TBEC: 325.0000 mg | DELAYED_RELEASE_TABLET | Freq: Two times a day (BID) | ORAL | 0 refills | Status: DC

## 2021-03-30 NOTE — Progress Notes (Signed)
Cardiology Office Note:    Date:  03/30/2021   ID:  [name], DOB 09/15/1955, MRN [id number]  PCP:  [name], MD   [facility] Providers Cardiologist:  Werner Lean, MD     Referring MD: [name], MD   HFrEF follow up   History of Present Illness:    [name] is a 66 y.o. 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 In interim of ED visit, patient started on MRA.  At last visit we found that he had low iron ferritin 34 and iron sat 8. Seen 03/30/21.  Had ED visit for asymptomatic BNP 4500 (has always been elevated).  No chest pain or pressure.   No SOB and no PND/Orthopnea. No palpitations or syncope. Notes a bit of constipation. Overall feels well and about the same.  Isn't sure what I could do today to make him feel better.   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   Left body jerking   Hypertension    Neuropathy    Noncompliance with medications    Open knee wound 10/2015   on rt bka   PAD (peripheral artery disease) (HCC)    Protein calorie malnutrition ([place])    Shortness of breath dyspnea     Past Surgical History:  Procedure Laterality Date   ABDOMINAL AORTOGRAM W/LOWER EXTREMITY N/A 03/25/2017   Procedure: ABDOMINAL AORTOGRAM W/LOWER  EXTREMITY;  Surgeon: Angelia Mould, MD;  Location: [place] [facility];  Service: Cardiovascular;  Laterality: N/A;   AMPUTATION Right 09/26/2013   Procedure: AMPUTATION BELOW KNEE;  Surgeon: Rozanna Box, MD;  Location: [place];  Service: Orthopedics;  Laterality: Right;   AMPUTATION Right 05/01/2015   Procedure: REVISION OF RIGHT TRANSTIBIAL AMPUTATION ;  Surgeon: Newt Minion, MD;  Location: [place];  Service: Orthopedics;  Laterality: Right;   AMPUTATION Left 06/24/2020   Procedure: LEFT BELOW KNEE AMPUTATION;  Surgeon: Newt Minion, MD;  Location: [place];  Service: Orthopedics;  Laterality: Left;   APPLICATION OF WOUND VAC  08/10/2016   Procedure: APPLICATION OFI [place]  WOUND VAC;  Surgeon: Newt Minion, MD;  Location: [place];  Service: Orthopedics;;   CARDIAC CATHETERIZATION N/A 07/13/2015   Procedure: Right/Left Heart Cath and Coronary Angiography;  Surgeon: Jolaine Artist, MD;  Location: [place] [facility];  Service: Cardiovascular;  Laterality: N/A;   COLONOSCOPY     I & D EXTREMITY Right 09/24/2013   Procedure: IRRIGATION AND DEBRIDEMENT RIGHT FOOT ULCER;  Surgeon: Renette Butters, MD;  Location: [place];  Service: Orthopedics;  Laterality: Right;   I & D EXTREMITY Right 09/26/2013   Procedure: Repeat IRRIGATION AND DEBRIDEMENT  Right Foot Ulcer;  Surgeon: Rozanna Box, MD;  Location: [place];  Service: Orthopedics;  Laterality: Right;   MULTIPLE TOOTH EXTRACTIONS     SKIN SPLIT GRAFT Right 08/10/2016   Procedure: SKIN GRAFT SPLIT THICKNESS RIGHT LEG;  Surgeon: Newt Minion, MD;  Location: [place];  Service: Orthopedics;  Laterality: Right;   STUMP REVISION Right 04/20/2015   Procedure: Revision Right Below Knee Amputation, Apply Wound VAC;  Surgeon: Newt Minion, MD;  Location: [place];  Service: Orthopedics;  Laterality: Right;   TONSILLECTOMY      Current Medications: Current Meds  Medication Sig   acetaminophen (TYLENOL) 325 MG tablet Take 650 mg by mouth every 6 (six)  hours as needed for mild pain (AND CANNOT EXCEED 3,000 MG/24 HOURS OF TYLENOL FROM ALL COMBINED SOURCES).   aspirin 81 MG EC tablet Take 81 mg by mouth daily.   atorvastatin (LIPITOR) 40 MG tablet Take 40 mg by mouth at bedtime.    bisacodyl (DULCOLAX) 5 MG EC tablet Take 10 mg by mouth daily as needed for moderate constipation.   bisoprolol (ZEBETA) 10 MG tablet Take 1 tablet (10 mg total) by mouth daily.   divalproex (DEPAKOTE) 125 MG DR tablet Take 125 mg by mouth in the morning and at bedtime.   empagliflozin (JARDIANCE) 10 MG TABS tablet Take by mouth daily.   ferrous sulfate 325 (65 FE) MG EC tablet Take 1 tablet (325 mg total) by mouth 2 (two) times daily with a meal.   furosemide (LASIX) 40 MG tablet Take 1 tablet (40 mg total) by mouth daily.   GLUCAGEN HYPOKIT 1 MG SOLR injection Inject 1 mg into the muscle once as needed for low blood sugar.   isosorbide mononitrate (IMDUR) 30 MG 24 hr tablet Take 1 tablet (30 mg total) by mouth daily.   levETIRAcetam (KEPPRA) 750 MG tablet Take 1 tablet (750 mg total) by mouth 2 (two) times daily.   Polyethyl Glycol-Propyl Glycol (SYSTANE) 0.4-0.3 % GEL ophthalmic gel Place 1 application into both eyes every 8 (eight) hours.   polyethylene glycol (MIRALAX / GLYCOLAX) packet Take 17 g by mouth 2 (two) times daily.   RHOPRESSA 0.02 % SOLN Place 1 drop into the left eye at bedtime.   sacubitril-valsartan (ENTRESTO) 24-26 MG Take 1 tablet by mouth 2 (two) times daily.   senna-docusate (SENOKOT-S) 8.6-50 MG tablet Take 1 tablet by mouth at bedtime.   sertraline (ZOLOFT) 50 MG tablet Take 50 mg by mouth in the morning.   sitaGLIPtin (JANUVIA) 100 MG tablet Take 100 mg by mouth daily.   spironolactone (ALDACTONE) 25 MG tablet Take 0.5 tablets (12.5 mg total) by mouth daily.   tamsulosin (FLOMAX) 0.4 MG CAPS capsule Take 2 capsules (0.8 mg total) by mouth daily after supper.   White Petrolatum-Mineral Oil (ALTALUBE) 85-15 % OINT Place 1 application into both  eyes at bedtime.     Allergies:   Patient has no known allergies.   Social History   Socioeconomic History   Marital status: Married    Spouse name: Not on file   Number of children: 0   Years of education: 12   Highest education level: Not on file  Occupational History   Not on file  Tobacco Use   Smoking status: Former    Years: 20.00    Types: Cigarettes    Quit date: 05/10/2000    Years since quitting: 20.9   Smokeless tobacco: Former    Quit date: 02/22/2000  Vaping Use   Vaping Use: Never used  Substance and Sexual Activity   Alcohol use: No    Alcohol/week: 0.0 standard drinks   Drug use: No   Sexual activity: Not on file  Other Topics Concern   Not on file  Social History Narrative   ** Merged History Encounter **       Lives alone.    Caffeine use: Tea/soda daily   Coffee sometimes    Left handed    Social Determinants of Health   Financial Resource Strain: Not on file  Food Insecurity: Not on file  Transportation Needs: Not on file  Physical Activity: Not on file  Stress: Not on file  Social Connections: Not on file     Family History: The patient's family history includes Cancer in his mother; Peripheral vascular disease in his father.  ROS:   Please see the history of present illness.     All other systems reviewed and are negative.  EKGs/Labs/Other Studies Reviewed:    The following studies were reviewed today:  Transthoracic Echocardiogram: Date: 12/18/20 Results:  1. Left ventricular ejection fraction, by estimation, is 20 to 25%. The  left ventricle has severely decreased function. The left ventricle  demonstrates global hypokinesis. There is mild concentric left ventricular  hypertrophy. Left ventricular diastolic   parameters are consistent with Grade III diastolic dysfunction  (restrictive). Elevated left ventricular end-diastolic pressure. The E/e'  is 29.6.   2. Right ventricular systolic function is moderately reduced. The  right  ventricular size is severely enlarged. There is moderately elevated  pulmonary artery systolic pressure. The estimated right ventricular  systolic pressure is 96.7 mmHg.   3. Left atrial size was mildly dilated.   4. Right atrial size was mild to moderately dilated.   5. The mitral valve is grossly normal. Mild mitral valve regurgitation.  No evidence of mitral stenosis.   6. Tricuspid valve regurgitation is moderate to severe.   7. The aortic valve is tricuspid. Aortic valve regurgitation is not  visualized. No aortic stenosis is present.   8. The inferior vena cava is dilated in size with >50% respiratory  variability, suggesting right atrial pressure of 8 mmHg.   Comparison(s): Changes from prior study are noted. The left ventricular  function is significantly worse.   Left/Right Heart Catheterizations: Date: 2017  Results: Mid LAD lesion, 20% stenosed.   Findings:   Ao = 135/87 (107) LV = 141/20/30 RA = 17 RV = 67/11/20 PA = 62/29 (41) PCW = 35 (v wave 45-50) Fick cardiac output/index = 6.0/2.7 PVR = 1.0 WU SVR = 1198  FA sat = 88% PA sat = 58%, 57%   Assessment: 1. Minimal non-obstructive CAD 2. Severe NICM with EF 15% by echo 3. Markedly elevated filling pressures with normal PVR 4. Normal cardiac output   Plan/Discussion:   He has severe NICM. Still markedly volume overload. Continue diuresis. Pulmonary HTN is all left-sided. PA pressures should normalize with diuresis.    Recent Labs: 12/19/2020: TSH 1.129 01/05/2021: Magnesium 2.3 03/17/2021: NT-Pro BNP 30,670 03/21/2021: ALT 16; B Natriuretic Peptide >4,500.0; BUN 42; Creatinine, Ser 1.60; Hemoglobin 13.1; Platelets 286; Potassium 3.5; Sodium 138  Recent Lipid Panel    Component Value Date/Time   CHOL 106 12/17/2020 2015   TRIG 45 12/17/2020 2015   HDL 48 12/17/2020 2015   CHOLHDL 2.2 12/17/2020 2015   VLDL 9 12/17/2020 2015   LDLCALC 49 12/17/2020 2015   LDLDIRECT 100 (H) 04/07/2007 2115  Physical Exam:    VS:  BP 118/76 (BP Location: Right Arm, Patient Position: Sitting, Cuff Size: Large)    Pulse 73    Ht 5\' 6"  (1.676 m)    SpO2 98%    BMI 31.73 kg/m     Wt Readings from Last 3 Encounters:  01/18/21 89.2 kg  01/05/21 84.1 kg  12/27/20 88.5 kg    Gen: Chronically ill appearing African-American male in no distress, blind in left eye  Neck: o carotid bruit Ears: + Pilar Plate Sign Cardiac: No Rubs or Gallops, II/VI holosystolic murmur RRR, 2+ radial pulses, distant heart sounds Respiratory: Clear to auscultation bilaterally,  normal respiratory rate GI: Soft, nontender, non-distended  MS: B/l BKA;  moves all extremities Integument: Skin feels warm an dry Neuro:  At time of evaluation, alert and answers questions appropriately  Psych: Normal affect   ASSESSMENT:    1. 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 HTN with CKD Stage IIIa and DM  L eye blindness - NYHA class I-II, Stage C-D, euvolemic, etiology from NICM - Diuretic regimen: Lasix; 40 mg PO daily - Continue  Bisprolol  10 mg daily  - Continue Entresto 24-26, aldactone, jardiance  - Aldactone 12.5 mg PO daily - Imdur 30 mg PO daily -F/u with advanced heart failure team in February - on ASA 81 mg PO daily and atorvastatin 40 mg in the setting of PAD - added iron 325 BID and docusate senna for 6 weeks; we are unable to get him IV iron infusion - not a candidate for Advanced HF therapies - narrow QRS- I will talk with him and family about Primary prevention ICD at next visit- his infections issues with his legs has resolved for some time, he is 44, He would likely not requiring pacing, and DC-ICD is at least reasonable for discussion - guarded prognosis          Medication Adjustments/Labs and Tests Ordered: Current medicines are reviewed at length with the  patient today.  Concerns regarding medicines are outlined above.  No orders of the defined types were placed in this encounter.   Meds ordered this encounter  Medications   ferrous sulfate 325 (65 FE) MG EC tablet    Sig: Take 1 tablet (325 mg total) by mouth 2 (two) times daily with a meal.    Dispense:  84 tablet    Refill:  0     Patient Instructions  Medication Instructions:  Your physician has recommended you make the following change in your medication:  START: Ferrous Sulfate 325 mg by mouth twice daily with meals for 6 weeks  *If you need a refill on your cardiac medications before your next appointment, please call your pharmacy*   Lab Work: NONE If you have labs (blood work) drawn today and your tests are completely normal, you will receive your results only by: [place] (if you have MyChart) OR A paper copy in the mail If you have any lab test that is abnormal or we need to change your treatment, we will call you to review the results.   Testing/Procedures: NONE   Follow-Up: At [facility], you and your health needs are our priority.  As part of our continuing mission to provide you with exceptional heart care, we have created designated Provider Care Teams.  These Care Teams include your  primary Cardiologist (physician) and Advanced Practice Providers (APPs -  Physician Assistants and Nurse Practitioners) who all work together to provide you with the care you need, when you need it.  We recommend signing up for the patient portal called "MyChart".  Sign up information is provided on this After Visit Summary.  MyChart is used to connect with patients for Virtual Visits (Telemedicine).  Patients are able to view lab/test results, encounter notes, upcoming appointments, etc.  Non-urgent messages can be sent to your provider as well.   To learn more about what you can do with MyChart, go to [email or web address].    Your next appointment:   3  month(s)  The format for your next appointment:   In Person  Provider:   Werner Lean, MD  or Melina Copa, PA-C or Ermalinda Barrios, PA-C           Signed, Werner Lean, MD  03/30/2021 10:56 AM    [place]

## 2021-03-30 NOTE — Patient Instructions (Addendum)
Medication Instructions:  Your physician has recommended you make the following change in your medication:  START: Ferrous Sulfate 325 mg by mouth twice daily with meals for 6 weeks  *If you need a refill on your cardiac medications before your next appointment, please call your pharmacy*   Lab Work: NONE If you have labs (blood work) drawn today and your tests are completely normal, you will receive your results only by: [place] (if you have MyChart) OR A paper copy in the mail If you have any lab test that is abnormal or we need to change your treatment, we will call you to review the results.   Testing/Procedures: NONE   Follow-Up: At [facility], you and your health needs are our priority.  As part of our continuing mission to provide you with exceptional heart care, we have created designated Provider Care Teams.  These Care Teams include your primary Cardiologist (physician) and Advanced Practice Providers (APPs -  Physician Assistants and Nurse Practitioners) who all work together to provide you with the care you need, when you need it.  We recommend signing up for the patient portal called "MyChart".  Sign up information is provided on this After Visit Summary.  MyChart is used to connect with patients for Virtual Visits (Telemedicine).  Patients are able to view lab/test results, encounter notes, upcoming appointments, etc.  Non-urgent messages can be sent to your provider as well.   To learn more about what you can do with MyChart, go to [email or web address].    Your next appointment:   3 month(s)  The format for your next appointment:   In Person  Provider:   Werner Lean, MD  or Melina Copa, PA-C or Ermalinda Barrios, PA-C

## 2021-04-12 ENCOUNTER — Encounter (HOSPITAL_COMMUNITY): Payer: Medicaid Other | Admitting: Internal Medicine

## 2021-04-12 ENCOUNTER — Other Ambulatory Visit (HOSPITAL_COMMUNITY): Payer: Medicaid Other

## 2021-06-03 ENCOUNTER — Emergency Department (HOSPITAL_COMMUNITY): Payer: Medicaid Other

## 2021-06-03 ENCOUNTER — Encounter (HOSPITAL_COMMUNITY): Payer: Self-pay | Admitting: Emergency Medicine

## 2021-06-03 ENCOUNTER — Observation Stay (HOSPITAL_COMMUNITY): Payer: Medicaid Other

## 2021-06-03 ENCOUNTER — Observation Stay (HOSPITAL_COMMUNITY)
Admission: EM | Admit: 2021-06-03 | Discharge: 2021-06-04 | Disposition: A | Discharge status: Skilled Nursing Facility | Payer: Medicaid Other | Attending: Emergency Medicine | Admitting: Emergency Medicine

## 2021-06-03 ENCOUNTER — Other Ambulatory Visit: Payer: Self-pay

## 2021-06-03 DIAGNOSIS — R410 Disorientation, unspecified: Secondary | ICD-10-CM

## 2021-06-03 DIAGNOSIS — E11649 Type 2 diabetes mellitus with hypoglycemia without coma: Secondary | ICD-10-CM | POA: Diagnosis not present

## 2021-06-03 DIAGNOSIS — I5042 Chronic combined systolic (congestive) and diastolic (congestive) heart failure: Secondary | ICD-10-CM | POA: Insufficient documentation

## 2021-06-03 DIAGNOSIS — N1831 Chronic kidney disease, stage 3a: Secondary | ICD-10-CM | POA: Diagnosis not present

## 2021-06-03 DIAGNOSIS — Z794 Long term (current) use of insulin: Secondary | ICD-10-CM | POA: Diagnosis not present

## 2021-06-03 DIAGNOSIS — J90 Pleural effusion, not elsewhere classified: Secondary | ICD-10-CM | POA: Diagnosis not present

## 2021-06-03 DIAGNOSIS — I130 Hypertensive heart and chronic kidney disease with heart failure and stage 1 through stage 4 chronic kidney disease, or unspecified chronic kidney disease: Secondary | ICD-10-CM | POA: Diagnosis not present

## 2021-06-03 DIAGNOSIS — E875 Hyperkalemia: Secondary | ICD-10-CM | POA: Insufficient documentation

## 2021-06-03 DIAGNOSIS — I5084 End stage heart failure: Secondary | ICD-10-CM | POA: Diagnosis not present

## 2021-06-03 DIAGNOSIS — N179 Acute kidney failure, unspecified: Secondary | ICD-10-CM | POA: Diagnosis not present

## 2021-06-03 DIAGNOSIS — R451 Restlessness and agitation: Secondary | ICD-10-CM

## 2021-06-03 DIAGNOSIS — Z89511 Acquired absence of right leg below knee: Secondary | ICD-10-CM | POA: Insufficient documentation

## 2021-06-03 DIAGNOSIS — E785 Hyperlipidemia, unspecified: Secondary | ICD-10-CM | POA: Diagnosis not present

## 2021-06-03 DIAGNOSIS — Z20822 Contact with and (suspected) exposure to covid-19: Secondary | ICD-10-CM | POA: Diagnosis not present

## 2021-06-03 DIAGNOSIS — E119 Type 2 diabetes mellitus without complications: Secondary | ICD-10-CM

## 2021-06-03 DIAGNOSIS — Z89512 Acquired absence of left leg below knee: Secondary | ICD-10-CM | POA: Diagnosis not present

## 2021-06-03 DIAGNOSIS — Z7984 Long term (current) use of oral hypoglycemic drugs: Secondary | ICD-10-CM | POA: Insufficient documentation

## 2021-06-03 DIAGNOSIS — E1152 Type 2 diabetes mellitus with diabetic peripheral angiopathy with gangrene: Secondary | ICD-10-CM

## 2021-06-03 DIAGNOSIS — R079 Chest pain, unspecified: Secondary | ICD-10-CM

## 2021-06-03 DIAGNOSIS — R778 Other specified abnormalities of plasma proteins: Secondary | ICD-10-CM | POA: Diagnosis present

## 2021-06-03 DIAGNOSIS — I739 Peripheral vascular disease, unspecified: Secondary | ICD-10-CM | POA: Diagnosis present

## 2021-06-03 DIAGNOSIS — G40909 Epilepsy, unspecified, not intractable, without status epilepticus: Secondary | ICD-10-CM | POA: Insufficient documentation

## 2021-06-03 DIAGNOSIS — I5082 Biventricular heart failure: Secondary | ICD-10-CM | POA: Diagnosis not present

## 2021-06-03 DIAGNOSIS — E1122 Type 2 diabetes mellitus with diabetic chronic kidney disease: Secondary | ICD-10-CM | POA: Insufficient documentation

## 2021-06-03 DIAGNOSIS — E1151 Type 2 diabetes mellitus with diabetic peripheral angiopathy without gangrene: Secondary | ICD-10-CM | POA: Diagnosis not present

## 2021-06-03 DIAGNOSIS — H547 Unspecified visual loss: Secondary | ICD-10-CM

## 2021-06-03 DIAGNOSIS — H548 Legal blindness, as defined in USA: Secondary | ICD-10-CM | POA: Insufficient documentation

## 2021-06-03 DIAGNOSIS — Z79899 Other long term (current) drug therapy: Secondary | ICD-10-CM | POA: Insufficient documentation

## 2021-06-03 DIAGNOSIS — S81009A Unspecified open wound, unspecified knee, initial encounter: Secondary | ICD-10-CM | POA: Diagnosis present

## 2021-06-03 DIAGNOSIS — T68XXXA Hypothermia, initial encounter: Secondary | ICD-10-CM | POA: Diagnosis present

## 2021-06-03 DIAGNOSIS — R569 Unspecified convulsions: Secondary | ICD-10-CM

## 2021-06-03 DIAGNOSIS — Z66 Do not resuscitate: Secondary | ICD-10-CM | POA: Insufficient documentation

## 2021-06-03 DIAGNOSIS — R0602 Shortness of breath: Secondary | ICD-10-CM | POA: Diagnosis present

## 2021-06-03 DIAGNOSIS — Z87891 Personal history of nicotine dependence: Secondary | ICD-10-CM | POA: Insufficient documentation

## 2021-06-03 DIAGNOSIS — I10 Essential (primary) hypertension: Secondary | ICD-10-CM | POA: Diagnosis present

## 2021-06-03 DIAGNOSIS — Z7189 Other specified counseling: Secondary | ICD-10-CM

## 2021-06-03 DIAGNOSIS — Z9189 Other specified personal risk factors, not elsewhere classified: Secondary | ICD-10-CM

## 2021-06-03 DIAGNOSIS — Z7982 Long term (current) use of aspirin: Secondary | ICD-10-CM | POA: Insufficient documentation

## 2021-06-03 DIAGNOSIS — I509 Heart failure, unspecified: Secondary | ICD-10-CM

## 2021-06-03 LAB — CBC WITH DIFFERENTIAL/PLATELET
Abs Immature Granulocytes: 0.02 10*3/uL (ref 0.00–0.07)
Basophils Absolute: 0 10*3/uL (ref 0.0–0.1)
Basophils Relative: 0 %
Eosinophils Absolute: 0 10*3/uL (ref 0.0–0.5)
Eosinophils Relative: 0 %
HCT: 48.4 % (ref 39.0–52.0)
Hemoglobin: 14.9 g/dL (ref 13.0–17.0)
Immature Granulocytes: 0 %
Lymphocytes Relative: 9 %
Lymphs Abs: 0.6 10*3/uL — ABNORMAL LOW (ref 0.7–4.0)
MCH: 25 pg — ABNORMAL LOW (ref 26.0–34.0)
MCHC: 30.8 g/dL (ref 30.0–36.0)
MCV: 81.3 fL (ref 80.0–100.0)
Monocytes Absolute: 0.7 10*3/uL (ref 0.1–1.0)
Monocytes Relative: 10 %
Neutro Abs: 5.3 10*3/uL (ref 1.7–7.7)
Neutrophils Relative %: 81 %
Platelet Morphology: NORMAL
Platelets: 227 10*3/uL (ref 150–400)
RBC: 5.95 MIL/uL — ABNORMAL HIGH (ref 4.22–5.81)
RDW: 26.6 % — ABNORMAL HIGH (ref 11.5–15.5)
WBC: 6.7 10*3/uL (ref 4.0–10.5)
nRBC: 0 % (ref 0.0–0.2)

## 2021-06-03 LAB — TROPONIN I (HIGH SENSITIVITY)
Troponin I (High Sensitivity): 15 ng/L (ref <18)
Troponin I (High Sensitivity): 75 ng/L — ABNORMAL HIGH (ref <18)
Troponin I (High Sensitivity): 72 ng/L — ABNORMAL HIGH (ref <18)
Troponin I (High Sensitivity): 76 ng/L — ABNORMAL HIGH (ref <18)

## 2021-06-03 LAB — URINALYSIS, ROUTINE W REFLEX MICROSCOPIC
Bacteria, UA: NONE SEEN
Bilirubin Urine: NEGATIVE
Glucose, UA: NEGATIVE mg/dL
Hgb urine dipstick: NEGATIVE
Ketones, ur: NEGATIVE mg/dL
Leukocytes,Ua: NEGATIVE
Nitrite: NEGATIVE
Protein, ur: 100 mg/dL — AB
Specific Gravity, Urine: 1.016 (ref 1.005–1.030)
pH: 5 (ref 5.0–8.0)

## 2021-06-03 LAB — HEMOGLOBIN A1C
Hgb A1c MFr Bld: 6.3 % — ABNORMAL HIGH (ref 4.8–5.6)
Mean Plasma Glucose: 134.11 mg/dL

## 2021-06-03 LAB — RESP PANEL BY RT-PCR (FLU A&B, COVID) ARPGX2
Influenza A by PCR: NEGATIVE
Influenza B by PCR: NEGATIVE
SARS Coronavirus 2 by RT PCR: NEGATIVE

## 2021-06-03 LAB — BASIC METABOLIC PANEL
Anion gap: 10 (ref 5–15)
BUN: 49 mg/dL — ABNORMAL HIGH (ref 8–23)
CO2: 22 mmol/L (ref 22–32)
Calcium: 8.3 mg/dL — ABNORMAL LOW (ref 8.9–10.3)
Chloride: 104 mmol/L (ref 98–111)
Creatinine, Ser: 1.63 mg/dL — ABNORMAL HIGH (ref 0.61–1.24)
GFR, Estimated: 47 mL/min — ABNORMAL LOW (ref >60)
Glucose, Bld: 89 mg/dL (ref 70–99)
Potassium: 5.3 mmol/L — ABNORMAL HIGH (ref 3.5–5.1)
Sodium: 136 mmol/L (ref 135–145)

## 2021-06-03 LAB — BRAIN NATRIURETIC PEPTIDE: B Natriuretic Peptide: 4394.2 pg/mL — ABNORMAL HIGH (ref 0.0–100.0)

## 2021-06-03 LAB — LIPASE, BLOOD: Lipase: 19 U/L (ref 11–51)

## 2021-06-03 LAB — GLUCOSE, CAPILLARY
Glucose-Capillary: 75 mg/dL (ref 70–99)
Glucose-Capillary: 38 mg/dL — CL (ref 70–99)
Glucose-Capillary: 71 mg/dL (ref 70–99)
Glucose-Capillary: 124 mg/dL — ABNORMAL HIGH (ref 70–99)

## 2021-06-03 LAB — LACTATE DEHYDROGENASE: LDH: 393 U/L — ABNORMAL HIGH (ref 98–192)

## 2021-06-03 LAB — PROCALCITONIN: Procalcitonin: 0.17 ng/mL

## 2021-06-03 MED ORDER — DEXTROSE 50 % IV SOLN
INTRAVENOUS | Status: AC
  Administered 2021-06-03: 50 mL
  Filled 2021-06-03: qty 50

## 2021-06-03 MED ORDER — LORAZEPAM 2 MG/ML IJ SOLN
1.0000 mg | Freq: Four times a day (QID) | INTRAMUSCULAR | Status: DC | PRN
Start: 2021-06-03 — End: 2021-06-04
  Administered 2021-06-03: 1 mg via INTRAVENOUS
  Filled 2021-06-03: qty 1

## 2021-06-03 MED ORDER — ONDANSETRON HCL 4 MG/2ML IJ SOLN: 4.0000 mg | Freq: Four times a day (QID) | INTRAMUSCULAR | Status: DC | PRN

## 2021-06-03 MED ORDER — ENOXAPARIN SODIUM 40 MG/0.4ML IJ SOSY
40.0000 mg | PREFILLED_SYRINGE | INTRAMUSCULAR | Status: DC
  Administered 2021-06-03: 40 mg via SUBCUTANEOUS
  Filled 2021-06-03: qty 0.4

## 2021-06-03 MED ORDER — INSULIN ASPART 100 UNIT/ML IJ SOLN: 0.0000 [IU] | Freq: Every day | INTRAMUSCULAR | Status: DC

## 2021-06-03 MED ORDER — LIDOCAINE HCL 1 % IJ SOLN
INTRAMUSCULAR | Status: AC
  Administered 2021-06-03: 10 mL
  Filled 2021-06-03: qty 20

## 2021-06-03 MED ORDER — INSULIN ASPART 100 UNIT/ML IJ SOLN: 0.0000 [IU] | Freq: Three times a day (TID) | INTRAMUSCULAR | Status: DC

## 2021-06-03 MED ORDER — ACETAMINOPHEN 325 MG PO TABS: 650.0000 mg | ORAL_TABLET | ORAL | Status: DC | PRN

## 2021-06-03 NOTE — ED Triage Notes (Signed)
Pt in from [facility] via [place] for callout of "behavioral issues" per staff there. Staff state pt has been violent. Pt calm en route with [facility], but is frequently asking for artificial legs from facility, which he fears may be stolen. States his "chest and heart failure" have been bothering him a few days ?

## 2021-06-03 NOTE — H&P (Signed)
?History and Physical  ? ? ?Patient: [name] DOB: 1955-04-12 ?DOA: 06/03/2021 ?DOS: the patient was seen and examined on 06/03/2021 ?PCP: [name], MD  ?Patient coming from: SNF ? ?Chief Complaint:  ?Chief Complaint  ?Patient presents with  ? Shortness of Breath  ? ?HPI: [name] is a 66 y.o. male with medical history significant of PAD s/p BLKA, combined HF, HLD, HTN, DM2. Presenting with chest pain. Started in the middle of his chest yesterday. It was dull. It came in 10 - 15 minute episodes. He had no syncope or palpitations. He has some intermittent cough. His SNF has been evaluating him and has noted that he has been more agitated and violent lately. Otherwise, there are no other aggravating or alleviating factors.  ? ?Review of Systems: As mentioned in the history of present illness. All other systems reviewed and are negative. ?Past Medical History:  ?Diagnosis Date  ? Acute on chronic systolic heart failure, NYHA class 3 ([place])   ? AKI (acute kidney injury) ([place])   ? Anemia   ? BKA stump complication ([place])   ? Blind left eye   ? Blind left eye   ? Cardiomyopathy, dilated ([place])   ? CHF (congestive heart failure) ([place])   ? Combined systolic and diastolic congestive heart failure ([place])   ? Congestive dilated cardiomyopathy ([place]) 07/11/2015  ? Diabetes mellitus   ? Diabetic neuropathy associated with type 2 diabetes mellitus ([place])   ? 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 Left body jerking  ? Hypertension   ? Neuropathy   ? Noncompliance with medications   ? Open knee wound 10/2015  ? on rt bka  ? PAD (peripheral artery disease) ([place])   ? Protein calorie malnutrition ([place])   ? Shortness of breath dyspnea   ? ?Past Surgical History:  ?Procedure Laterality Date  ? ABDOMINAL AORTOGRAM W/LOWER EXTREMITY N/A 03/25/2017  ? Procedure: ABDOMINAL AORTOGRAM W/LOWER EXTREMITY;  Surgeon: Angelia Mould, MD;  Location: [place] [facility];  Service: Cardiovascular;  Laterality: N/A;  ?  AMPUTATION Right 09/26/2013  ? Procedure: AMPUTATION BELOW KNEE;  Surgeon: Rozanna Box, MD;  Location: [place];  Service: Orthopedics;  Laterality: Right;  ? AMPUTATION Right 05/01/2015  ? Procedure: REVISION OF RIGHT TRANSTIBIAL AMPUTATION ;  Surgeon: Newt Minion, MD;  Location: [place];  Service: Orthopedics;  Laterality: Right;  ? AMPUTATION Left 06/24/2020  ? Procedure: LEFT BELOW KNEE AMPUTATION;  Surgeon: Newt Minion, MD;  Location: [place];  Service: Orthopedics;  Laterality: Left;  ? APPLICATION OF WOUND VAC  08/10/2016  ? Procedure: APPLICATION OFI [place];  Surgeon: Newt Minion, MD;  Location: [place];  Service: Orthopedics;;  ? CARDIAC CATHETERIZATION N/A 07/13/2015  ? Procedure: Right/Left Heart Cath and Coronary Angiography;  Surgeon: Jolaine Artist, MD;  Location: [place] [facility];  Service: Cardiovascular;  Laterality: N/A;  ? COLONOSCOPY    ? I & D EXTREMITY Right 09/24/2013  ? Procedure: IRRIGATION AND DEBRIDEMENT RIGHT FOOT ULCER;  Surgeon: Renette [place]ters, MD;  Location: [place];  Service: Orthopedics;  Laterality: Right;  ? I & D EXTREMITY Right 09/26/2013  ? Procedure: Repeat IRRIGATION AND DEBRIDEMENT Right Foot Ulcer;  Surgeon: Rozanna Box, MD;  Location: [place];  Service: Orthopedics;  Laterality: Right;  ? MULTIPLE TOOTH EXTRACTIONS    ? SKIN SPLIT GRAFT Right 08/10/2016  ? Procedure: SKIN GRAFT SPLIT THICKNESS RIGHT LEG;  Surgeon:  Newt Minion, MD;  Location: [place];  Service: Orthopedics;  Laterality: Right;  ? STUMP REVISION Right 04/20/2015  ? Procedure: Revision Right Below Knee Amputation, Apply Wound VAC;  Surgeon: Newt Minion, MD;  Location: [place];  Service: Orthopedics;  Laterality: Right;  ? TONSILLECTOMY    ? ?Social History:  reports that he quit smoking about 21 years ago. His smoking use included cigarettes. He quit smokeless tobacco use about 21 years ago. He reports that he does not drink alcohol and does not use drugs. ? ?No Known Allergies ? ?Family History   ?Problem Relation Age of Onset  ? Cancer Mother   ? Peripheral vascular disease Father   ? ? ?Prior to Admission medications   ?Medication Sig Start Date End Date Taking? Authorizing Provider  ?acetaminophen (TYLENOL) 325 MG tablet Take 650 mg by mouth every 6 (six) hours as needed for mild pain (AND CANNOT EXCEED 3,000 MG/24 HOURS OF TYLENOL FROM ALL COMBINED SOURCES).    [provider]  ?aspirin 81 MG EC tablet Take 81 mg by mouth daily.    [provider]  ?atorvastatin (LIPITOR) 40 MG tablet Take 40 mg by mouth at bedtime.  08/25/15   [provider]  ?bisacodyl (DULCOLAX) 5 MG EC tablet Take 10 mg by mouth daily as needed for moderate constipation.    [provider]  ?bisoprolol (ZEBETA) 10 MG tablet Take 1 tablet (10 mg total) by mouth daily. 03/17/21   Werner Lean, MD  ?divalproex (DEPAKOTE) 125 MG DR tablet Take 125 mg by mouth in the morning and at bedtime.    [provider]  ?empagliflozin (JARDIANCE) 10 MG TABS tablet Take by mouth daily.    [provider]  ?ferrous sulfate 325 (65 FE) MG EC tablet Take 1 tablet (325 mg total) by mouth 2 (two) times daily with a meal. 03/30/21 05/11/21  Chandrasekhar, Mahesh A, MD  ?furosemide (LASIX) 40 MG tablet Take 1 tablet (40 mg total) by mouth daily. 03/17/21 06/15/21  Werner Lean, MD  ?GLUCAGEN HYPOKIT 1 MG SOLR injection Inject 1 mg into the muscle once as needed for low blood sugar.    [provider]  ?isosorbide mononitrate (IMDUR) 30 MG 24 hr tablet Take 1 tablet (30 mg total) by mouth daily. 12/21/20   Alcus Dad, MD  ?levETIRAcetam (KEPPRA) 750 MG tablet Take 1 tablet (750 mg total) by mouth 2 (two) times daily. 10/20/16   Kerney Elbe, DO  ?Polyethyl Glycol-Propyl Glycol (SYSTANE) 0.4-0.3 % GEL ophthalmic gel Place 1 application into both eyes every 8 (eight) hours.    [provider]  ?polyethylene glycol (MIRALAX / GLYCOLAX) packet Take 17 g by mouth  2 (two) times daily. 02/07/17   Caroline More, DO  ?RHOPRESSA 0.02 % SOLN Place 1 drop into the left eye at bedtime.    [provider]  ?sacubitril-valsartan (ENTRESTO) 24-26 MG Take 1 tablet by mouth 2 (two) times daily. 12/27/20   Lyda Jester M, PA-C  ?senna-docusate (SENOKOT-S) 8.6-50 MG tablet Take 1 tablet by mouth at bedtime.    [provider]  ?sertraline (ZOLOFT) 50 MG tablet Take 50 mg by mouth in the morning.    [provider]  ?sitaGLIPtin (JANUVIA) 100 MG tablet Take 100 mg by mouth daily.    [provider]  ?spironolactone (ALDACTONE) 25 MG tablet Take 0.5 tablets (12.5 mg total) by mouth daily. 01/18/21   Rafael Bihari, FNP  ?tamsulosin [facility])  0.4 MG CAPS capsule Take 2 capsules (0.8 mg total) by mouth daily after supper. 12/31/16   Caroline More, DO  ?White Petrolatum-Mineral Oil (ALTALUBE) 85-15 % OINT Place 1 application into both eyes at bedtime. 01/23/18   [provider]  ? ? ?Physical Exam: ?Vitals:  ? 06/03/21 0630 06/03/21 0700 06/03/21 0800 06/03/21 1000  ?BP: 106/89 (!) 106/94 (!) 124/107 111/89  ?Pulse: 79 66 77 81  ?Resp: '19 20 10 11  '$ ?Temp:      ?TempSrc:      ?SpO2: 95% 92% 95% 90%  ?Weight:      ? ?General: 66 y.o. male resting in bed in NAD ?Eyes: right eye reactive to light; left lens opaque ?ENMT: Nares patent w/o discharge, orophaynx clear, dentition poor, ears w/o discharge/lesions/ulcers ?Neck: Supple, trachea midline ?Cardiovascular: RRR, +S1, S2, no g/r, 3/6 SEM equal pulses throughout; can reproduce some of his dull pain w/ palpation ?Respiratory: decreased left base, no w/r/r, normal WOB ?GI: BS+, NDNT, no masses noted, no organomegaly noted ?MSK: No e/c/c; b/l BKA; multiple excortiations on both stumps ?Neuro: A&O x 3, no focal deficits ?Psyc: Appropriate interaction and affect, calm/cooperative ? ?Data Reviewed: ? ?K+  5.3 ?BUN 49 ?SCr  1.63 ?Trp 15 -> 75 ? ?CXR: 1. Subtotal opacification of the left  hemithorax since January favored due to progressed and now large left pleural effusion. Ultrasound can be used to confirm drainable fluid prior to thoracentesis. ?2. Cardiomegaly.  No pulmonary edema.  Negati

## 2021-06-03 NOTE — Progress Notes (Signed)
Nurse Tech completed CBG check and relayed a blood sugar of 38. I went to assess the patient and he was alert and oriented without any acute changes from the last time I saw him. I went to remove D50 ampule to give IV but patient did not have an IV placed. Gave patient two juices, and a boost breeze along with graham crackers and peanut butter. Recheck Blood sugar was 71 at 1825. Will start hypoglycemia protocols and continue to monitor.  ? ?MD also notified.  ?

## 2021-06-03 NOTE — ED Provider Notes (Signed)
?[facility] DEPT ?Provider Note ? ? ?CSN: [id number] ?Arrival date & time: 06/03/21  0601 ? ?  ? ?History ? ?Chief Complaint  ?Patient presents with  ? Medical Clearance  ? ? ?[name] is a 66 y.o. male. 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 ? ?States that for 1 week he has had intermittent, central, dull chest pain. He describes it as "bursts of pain" in his chest. Additionally endorses non-productive cough over the past week. He denies shortness of breath or palpitations. He is an advanced heart failure clinic patient and states he has been going to appointments with the next one in April. States he has mid abdominal "tightness" this week as well. He describes 2-3 episodes of nausea this week without vomiting. Denies diarrhea, urinary symptoms or fever.  ? ?Per EMS, patient is from [place] for "behavioral issues." Per report the staff report the patient has been violent. No behavioral concerns with EMS. Patient is alert, oriented and calm here in ED.  ? ?HPI ? ?  ? ?Home Medications ?Prior to Admission medications   ?Medication Sig Start Date End Date Taking? Authorizing Provider  ?acetaminophen (TYLENOL) 325 MG tablet Take 650 mg by mouth every 6 (six) hours as needed for mild pain (AND CANNOT EXCEED 3,000 MG/24 HOURS OF TYLENOL FROM ALL COMBINED SOURCES).    [provider]  ?aspirin 81 MG EC tablet Take 81 mg by mouth daily.    [provider]  ?atorvastatin (LIPITOR) 40 MG tablet Take 40 mg by mouth at bedtime.  08/25/15   [provider]  ?bisacodyl (DULCOLAX) 5 MG EC tablet Take 10 mg by mouth daily as needed for moderate constipation.    [provider]  ?bisoprolol (ZEBETA) 10 MG tablet Take 1 tablet (10 mg total) by mouth daily. 03/17/21   Werner Lean, MD  ?divalproex (DEPAKOTE) 125 MG DR tablet Take 125 mg by  mouth in the morning and at bedtime.    [provider]  ?empagliflozin (JARDIANCE) 10 MG TABS tablet Take by mouth daily.    [provider]  ?ferrous sulfate 325 (65 FE) MG EC tablet Take 1 tablet (325 mg total) by mouth 2 (two) times daily with a meal. 03/30/21 05/11/21  Chandrasekhar, Mahesh A, MD  ?furosemide (LASIX) 40 MG tablet Take 1 tablet (40 mg total) by mouth daily. 03/17/21 06/15/21  Werner Lean, MD  ?GLUCAGEN HYPOKIT 1 MG SOLR injection Inject 1 mg into the muscle once as needed for low blood sugar.    [provider]  ?isosorbide mononitrate (IMDUR) 30 MG 24 hr tablet Take 1 tablet (30 mg total) by mouth daily. 12/21/20   Alcus Dad, MD  ?levETIRAcetam (KEPPRA) 750 MG tablet Take 1 tablet (750 mg total) by mouth 2 (two) times daily. 10/20/16   Kerney Elbe, DO  ?Polyethyl Glycol-Propyl Glycol (SYSTANE) 0.4-0.3 % GEL ophthalmic gel Place 1 application into both eyes every 8 (eight) hours.    [provider]  ?polyethylene glycol (MIRALAX / GLYCOLAX) packet Take 17 g by mouth 2 (two) times daily. 02/07/17   Caroline More, DO  ?RHOPRESSA 0.02 % SOLN Place 1 drop into the left eye at bedtime.    [provider]  ?sacubitril-valsartan (ENTRESTO) 24-26 MG Take 1 tablet by mouth 2 (two) times daily. 12/27/20   Lyda Jester M, PA-C  ?senna-docusate (SENOKOT-S) 8.6-50 MG  tablet Take 1 tablet by mouth at bedtime.    [provider]  ?sertraline (ZOLOFT) 50 MG tablet Take 50 mg by mouth in the morning.    [provider]  ?sitaGLIPtin (JANUVIA) 100 MG tablet Take 100 mg by mouth daily.    [provider]  ?spironolactone (ALDACTONE) 25 MG tablet Take 0.5 tablets (12.5 mg total) by mouth daily. 01/18/21   Rafael Bihari, FNP  ?tamsulosin (FLOMAX) 0.4 MG CAPS capsule Take 2 capsules (0.8 mg total) by mouth daily after supper. 12/31/16   Caroline More, DO  ?White Petrolatum-Mineral Oil (ALTALUBE) 85-15 % OINT  Place 1 application into both eyes at bedtime. 01/23/18   [provider]  ?   ? ?Allergies    ?Patient has no known allergies.   ? ?Review of Systems   ?Review of Systems  ?Constitutional:  Negative for appetite change and fever.  ?Respiratory:  Positive for cough and chest tightness. Negative for shortness of breath.   ?Cardiovascular:  Positive for chest pain. Negative for palpitations and leg swelling.  ?Gastrointestinal:  Positive for abdominal pain and nausea. Negative for diarrhea and vomiting.  ?Genitourinary:  Negative for dysuria.  ?All other systems reviewed and are negative. ? ?Physical Exam ?Updated Vital Signs ?BP (!) 125/107   Pulse 81   Temp (!) 97.3 ?F (36.3 ?C) (Oral)   Resp 18   Wt 89.2 kg   SpO2 99%   BMI 31.74 kg/m?  ?Physical Exam ?Vitals and nursing note reviewed.  ?Constitutional:   ?   General: He is not in acute distress. ?   Appearance: Normal appearance. He is ill-appearing. He is not toxic-appearing.  ?HENT:  ?   Head: Normocephalic and atraumatic.  ?   Nose: Nose normal.  ?   Mouth/Throat:  ?   Mouth: Mucous membranes are dry.  ?   Pharynx: Oropharynx is clear.  ?Eyes:  ?   General: No scleral icterus. ?   Extraocular Movements: Extraocular movements intact.  ?   Comments: Left eye cataract - blind  ?Cardiovascular:  ?   Rate and Rhythm: Normal rate and regular rhythm.  ?   Pulses: Normal pulses.  ?   Heart sounds: Murmur heard.  ?Pulmonary:  ?   Effort: Pulmonary effort is normal. No respiratory distress.  ?   Breath sounds: Examination of the left-upper field reveals decreased breath sounds. Examination of the left-middle field reveals decreased breath sounds. Examination of the right-lower field reveals rales. Examination of the left-lower field reveals decreased breath sounds and rales. Decreased breath sounds and rales present.  ?Abdominal:  ?   General: Bowel sounds are normal. There is no distension.  ?   Palpations: Abdomen is soft.  ?   Tenderness: There is no  abdominal tenderness. There is no guarding.  ?Musculoskeletal:     ?   General: No tenderness or signs of injury.  ?   Cervical back: Normal range of motion and neck supple.  ?   Comments: Bilateral BKA  ?Skin: ?   General: Skin is warm and dry.  ?   Capillary Refill: Capillary refill takes less than 2 seconds.  ?Neurological:  ?   General: No focal deficit present.  ?   Mental Status: He is alert and oriented to person, place, and time. Mental status is at baseline.  ?Psychiatric:     ?   Mood and Affect: Mood normal.     ?   Behavior: Behavior normal.     ?  Thought Content: Thought content normal.     ?   Judgment: Judgment normal.  ? ? ?ED Results / Procedures / Treatments   ?Labs ?(all labs ordered are listed, but only abnormal results are displayed) ?Labs Reviewed  ?BASIC METABOLIC PANEL - Abnormal; Notable for the following components:  ?    Result Value  ? Potassium 5.3 (*)   ? BUN 49 (*)   ? Creatinine, Ser 1.63 (*)   ? Calcium 8.3 (*)   ? GFR, Estimated 47 (*)   ? All other components within normal limits  ?CBC WITH DIFFERENTIAL/PLATELET - Abnormal; Notable for the following components:  ? RBC 5.95 (*)   ? MCH 25.0 (*)   ? RDW 26.6 (*)   ? Lymphs Abs 0.6 (*)   ? All other components within normal limits  ?URINALYSIS, ROUTINE W REFLEX MICROSCOPIC - Abnormal; Notable for the following components:  ? Color, Urine AMBER (*)   ? APPearance HAZY (*)   ? Protein, ur 100 (*)   ? All other components within normal limits  ?RESP PANEL BY RT-PCR (FLU A&B, COVID) ARPGX2  ?LIPASE, BLOOD  ?BRAIN NATRIURETIC PEPTIDE  ?TROPONIN I (HIGH SENSITIVITY)  ?TROPONIN I (HIGH SENSITIVITY)  ? ?EKG ?EKG Interpretation ? ?Date/Time:  Saturday June 03 2021 07:24:39 EDT ?Ventricular Rate:  78 ?PR Interval:  60 ?QRS Duration: 113 ?QT Interval:  447 ?QTC Calculation: 510 ?R Axis:   -56 ?Text Interpretation: Sinus rhythm Premature ventricular complexes Non-specific intra-ventricular conduction delay Non-specific ST-t changes Confirmed  by Lajean Saver [phone]) on 06/03/2021 7:48:25 AM ? ?Radiology ?DG Chest Port 1 View ? ?Result Date: 06/03/2021 ?CLINICAL DATA:  66 year old male with chest pain. EXAM: PORTABLE CHEST 1 VIEW COMPARISON:  Chest radiograp

## 2021-06-03 NOTE — Progress Notes (Signed)
Received patient into room 1414. Alert and oriented x 4. Denies sob and chest pain. Cardiac monitor and continuous pulse oximetry started per orders. No skin break down noticed. Will continue to monitor.  ?

## 2021-06-03 NOTE — Progress Notes (Addendum)
?   06/03/21 1936  ?Vitals  ?Temp (!) 95.5 ?F (35.3 ?C)  ?Temp Source Rectal  ?BP 106/85  ?MAP (mmHg) 93  ?BP Location Left Arm  ?BP Method Automatic  ?Patient Position (if appropriate) Lying  ?Pulse Rate 76  ?Pulse Rate Source Monitor  ?Resp 20  ?MEWS COLOR  ?MEWS Score Color Green  ?Oxygen Therapy  ?SpO2 96 %  ?O2 Device Room Air  ?MEWS Score  ?MEWS Temp 1  ?MEWS Systolic 0  ?MEWS Pulse 0  ?MEWS RR 0  ?MEWS LOC 0  ?MEWS Score 1  ? ?Applied warm blankets to pt, warm packs under arms, increased room temperature and notified provider. ?

## 2021-06-03 NOTE — Procedures (Signed)
PROCEDURE SUMMARY: ? ?Successful US guided left thoracentesis. ?Yielded 1.2 L of clear yellow fluid. ?Pt tolerated procedure well. ?No immediate complications. ? ?Specimen not sent for labs. ?CXR ordered; no post-procedure pneumothorax identified.  ? ?EBL < 2 mL ? ?Theresa Duty, NP ?06/03/2021 ?12:46 PM ? ? ? ?

## 2021-06-04 ENCOUNTER — Observation Stay (HOSPITAL_BASED_OUTPATIENT_CLINIC_OR_DEPARTMENT_OTHER): Payer: Medicaid Other

## 2021-06-04 DIAGNOSIS — R410 Disorientation, unspecified: Secondary | ICD-10-CM | POA: Diagnosis not present

## 2021-06-04 DIAGNOSIS — R778 Other specified abnormalities of plasma proteins: Secondary | ICD-10-CM

## 2021-06-04 DIAGNOSIS — I739 Peripheral vascular disease, unspecified: Secondary | ICD-10-CM

## 2021-06-04 DIAGNOSIS — Z7189 Other specified counseling: Secondary | ICD-10-CM

## 2021-06-04 DIAGNOSIS — S81002A Unspecified open wound, left knee, initial encounter: Secondary | ICD-10-CM

## 2021-06-04 DIAGNOSIS — Z9189 Other specified personal risk factors, not elsewhere classified: Secondary | ICD-10-CM

## 2021-06-04 DIAGNOSIS — E1142 Type 2 diabetes mellitus with diabetic polyneuropathy: Secondary | ICD-10-CM

## 2021-06-04 DIAGNOSIS — I5042 Chronic combined systolic (congestive) and diastolic (congestive) heart failure: Secondary | ICD-10-CM | POA: Diagnosis not present

## 2021-06-04 DIAGNOSIS — R079 Chest pain, unspecified: Secondary | ICD-10-CM

## 2021-06-04 DIAGNOSIS — H543 Unqualified visual loss, both eyes: Secondary | ICD-10-CM | POA: Diagnosis not present

## 2021-06-04 DIAGNOSIS — N1831 Chronic kidney disease, stage 3a: Secondary | ICD-10-CM

## 2021-06-04 DIAGNOSIS — E785 Hyperlipidemia, unspecified: Secondary | ICD-10-CM

## 2021-06-04 DIAGNOSIS — R451 Restlessness and agitation: Secondary | ICD-10-CM

## 2021-06-04 DIAGNOSIS — I10 Essential (primary) hypertension: Secondary | ICD-10-CM

## 2021-06-04 DIAGNOSIS — R569 Unspecified convulsions: Secondary | ICD-10-CM

## 2021-06-04 LAB — ECHOCARDIOGRAM COMPLETE
AR max vel: 2.18 cm2
AV Peak grad: 2.6 mmHg
Ao pk vel: 0.8 m/s
Area-P 1/2: 5.79 cm2
Calc EF: 22.1 %
S' Lateral: 5.7 cm
Single Plane A2C EF: 20.2 %
Single Plane A4C EF: 23 %
Weight: 3146.41 oz

## 2021-06-04 LAB — GLUCOSE, CAPILLARY
Glucose-Capillary: 78 mg/dL (ref 70–99)
Glucose-Capillary: 98 mg/dL (ref 70–99)

## 2021-06-04 LAB — RENAL FUNCTION PANEL
Albumin: 2.2 g/dL — ABNORMAL LOW (ref 3.5–5.0)
Anion gap: 8 (ref 5–15)
BUN: 51 mg/dL — ABNORMAL HIGH (ref 8–23)
CO2: 26 mmol/L (ref 22–32)
Calcium: 8.3 mg/dL — ABNORMAL LOW (ref 8.9–10.3)
Chloride: 103 mmol/L (ref 98–111)
Creatinine, Ser: 1.47 mg/dL — ABNORMAL HIGH (ref 0.61–1.24)
GFR, Estimated: 53 mL/min — ABNORMAL LOW (ref >60)
Glucose, Bld: 83 mg/dL (ref 70–99)
Phosphorus: 4.2 mg/dL (ref 2.5–4.6)
Potassium: 3.5 mmol/L (ref 3.5–5.1)
Sodium: 137 mmol/L (ref 135–145)

## 2021-06-04 LAB — CBC
HCT: 47.4 % (ref 39.0–52.0)
Hemoglobin: 14.5 g/dL (ref 13.0–17.0)
MCH: 24.4 pg — ABNORMAL LOW (ref 26.0–34.0)
MCHC: 30.6 g/dL (ref 30.0–36.0)
MCV: 79.8 fL — ABNORMAL LOW (ref 80.0–100.0)
Platelets: 179 10*3/uL (ref 150–400)
RBC: 5.94 MIL/uL — ABNORMAL HIGH (ref 4.22–5.81)
RDW: 26.4 % — ABNORMAL HIGH (ref 11.5–15.5)
WBC: 5.8 10*3/uL (ref 4.0–10.5)
nRBC: 0 % (ref 0.0–0.2)

## 2021-06-04 LAB — LIPID PANEL
Cholesterol: 62 mg/dL (ref 0–200)
HDL: 35 mg/dL — ABNORMAL LOW (ref >40)
LDL Cholesterol: 20 mg/dL (ref 0–99)
Total CHOL/HDL Ratio: 1.8 RATIO
Triglycerides: 37 mg/dL (ref <150)
VLDL: 7 mg/dL (ref 0–40)

## 2021-06-04 LAB — MAGNESIUM: Magnesium: 2.3 mg/dL (ref 1.7–2.4)

## 2021-06-04 LAB — MRSA NEXT GEN BY PCR, NASAL: MRSA by PCR Next Gen: DETECTED — AB

## 2021-06-04 MED ORDER — ASPIRIN EC 81 MG PO TBEC
81.0000 mg | DELAYED_RELEASE_TABLET | Freq: Every day | ORAL | Status: DC
  Administered 2021-06-04: 81 mg via ORAL
  Filled 2021-06-04: qty 1

## 2021-06-04 MED ORDER — LEVETIRACETAM 500 MG PO TABS
750.0000 mg | ORAL_TABLET | Freq: Two times a day (BID) | ORAL | Status: DC
  Administered 2021-06-04: 750 mg via ORAL
  Filled 2021-06-04: qty 1

## 2021-06-04 MED ORDER — MUPIROCIN 2 % EX OINT
1.0000 "application " | TOPICAL_OINTMENT | Freq: Two times a day (BID) | CUTANEOUS | Status: DC
  Administered 2021-06-04: 1 via NASAL
  Filled 2021-06-04: qty 22

## 2021-06-04 MED ORDER — RISPERIDONE 1 MG/ML PO SOLN: 2.0000 mg | Freq: Every morning | ORAL | Status: DC

## 2021-06-04 MED ORDER — DIVALPROEX SODIUM 125 MG PO DR TAB
125.0000 mg | DELAYED_RELEASE_TABLET | Freq: Two times a day (BID) | ORAL | Status: DC
Start: 2021-06-04 — End: 2021-06-04

## 2021-06-04 MED ORDER — BISOPROLOL FUMARATE 5 MG PO TABS
10.0000 mg | ORAL_TABLET | Freq: Every day | ORAL | Status: DC
  Administered 2021-06-04: 10 mg via ORAL
  Filled 2021-06-04: qty 2

## 2021-06-04 MED ORDER — ATORVASTATIN CALCIUM 40 MG PO TABS: 40.0000 mg | ORAL_TABLET | Freq: Every day | ORAL | Status: DC

## 2021-06-04 MED ORDER — EMPAGLIFLOZIN 10 MG PO TABS
10.0000 mg | ORAL_TABLET | Freq: Every day | ORAL | Status: DC
  Filled 2021-06-04: qty 1

## 2021-06-04 MED ORDER — NETARSUDIL DIMESYLATE 0.02 % OP SOLN: 1.0000 [drp] | Freq: Every day | OPHTHALMIC | Status: DC

## 2021-06-04 MED ORDER — ALTALUBE 85-15 % OP OINT: 1.0000 "application " | TOPICAL_OINTMENT | Freq: Every day | OPHTHALMIC | Status: DC

## 2021-06-04 MED ORDER — CHLORHEXIDINE GLUCONATE CLOTH 2 % EX PADS: 6.0000 | MEDICATED_PAD | Freq: Every day | CUTANEOUS | Status: DC

## 2021-06-04 MED ORDER — SERTRALINE HCL 50 MG PO TABS
50.0000 mg | ORAL_TABLET | Freq: Every morning | ORAL | Status: DC
  Administered 2021-06-04: 50 mg via ORAL
  Filled 2021-06-04: qty 1

## 2021-06-04 MED ORDER — FUROSEMIDE 40 MG PO TABS
40.0000 mg | ORAL_TABLET | Freq: Every day | ORAL | Status: DC
  Administered 2021-06-04: 40 mg via ORAL
  Filled 2021-06-04: qty 1

## 2021-06-04 MED ORDER — DIVALPROEX SODIUM 250 MG PO DR TAB
250.0000 mg | DELAYED_RELEASE_TABLET | Freq: Two times a day (BID) | ORAL | Status: DC
  Administered 2021-06-04 (×2): 250 mg via ORAL
  Filled 2021-06-04 (×2): qty 1

## 2021-06-04 NOTE — Progress Notes (Signed)
PT Cancellation Note ? ?Patient Details ?Name: KUTTER SCHNEPF ?MRN: [id number] ?DOB: 1955/07/21 ? ? ?Cancelled Treatment:    PT order received but eval deferred this am.  Per nurse report patient is currently in deep sleep after Ativan administration. PT to continue to follow and check back when patient is more alert. ? ? ?Denna Fryberger ?06/04/2021, 8:17 AM ?

## 2021-06-04 NOTE — Progress Notes (Signed)
Placed call to patients sister [name]) to help with calming patient down. Patient very uncooperative and reluctant to let staff give care. Pt has no IV access and IV team nurse is at bedside to gain IV access. Patient spoke with sister and IV access was gained. Site was wrapped with kerlix. Patient pulled continuous pulse ox off multiple times and refusing to wear. ?

## 2021-06-04 NOTE — Discharge Summary (Addendum)
? ?Physician Discharge Summary  ?[name]:[id number] DOB: 31-Aug-1955 DOA: 06/03/2021 ? ?PCP: [name], MD ? ?Admit date: 06/03/2021 ?Discharge date: 06/04/2021 ?Admitted From: SNF ?Disposition: SNF ?Recommendations for Outpatient Follow-up:  ?Follow ups as below. ?Please obtain CBC/BMP/Mag at follow up ?Recommend sodium and fluid restriction to 2 g/day and 1500 cc/day respectively. ?Recommend outpatient follow-up with cardiology in 1 to 2 weeks ?Recommend palliative follow-up at SNF ?Please follow up on the following pending results: None ? ? ?Discharge Condition: Stable but poor long-term prognosis ?CODE STATUS: DNR/DNI ? ? 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  ? ?In ED, vitals stable.  Normal saturation on RA.  K5.3. Cr 1.63 (about baseline)..  BUN 49.  CBC with differential and UA without significant finding.  CXR with subtotal opacification of left hemithorax due to progressive large left pleural effusion.  Troponin 15>> 75.  EKG sinus rhythm with occasional PVCs and QTc to 510.  BNP 4400 (about baseline).  Thoracocentesis ordered by EDP.  1.2 L pleural fluid drained.  Unfortunately, pleural fluid studies were not ordered at the time of thoracocentesis, and samples were not sent.  Patient was admitted for chest pain/elevated troponin and cough. ? ?Patient had an episode of mild hypothermia to 95.5 ?F overnight that has resolved with warm blanket.  He also had an episode of confusion and agitation that has resolved this morning.   ? ?At the time of my evaluation later this morning, awake and oriented x4.  Chest pain resolved.  Denies dyspnea, cough, GI or UTI symptoms.  Vitals remained stable.  Saturating at 98% on room air.  ? ?See individual problem list below for more on hospital course. ? ?Problems  addressed during this hospitalization ?Problem  ?Delirium and agitation  ?Chest pain and elevated troponin  ?Hypothermia  ?Chronic Combined Systolic and Diastolic Congestive Heart Failure (Hcc)  ?At Risk for Sleep Apnea  ?Pleural Effusion Due to Chf (Congestive Heart Failure) (Hcc)  ?Seizure (Hcc)  ?Stage 3a Chronic Kidney Disease (Ckd) (Hcc)  ?PAD s/p bilateral BKA  ?Blindness  ?Controlled NIDDM-2 with hypoglycemia and CKD-3A  ? History of poor compliance  ? ?  ?Agitation  ?Goals of Care, Counseling/Discussion  ?Essential Hypertension  ?  ?Assessment and Plan: ?* Chest pain and elevated troponin ?Doubt ACS.  Troponin 15> 75> 72> 76, likely demand ischemia and delayed clearance.  EKG without acute ischemic finding.  Chest pain resolved.  TTE with LVEF<20% (previously 20 to 25%), G2 DD and RVSP of 60.4 mmHg.  Hemodynamically stable. ?-Continue home cardiac meds ? ?Delirium and agitation ?Confusion and agitation likely delirium.  He was also hypoglycemic to 38 which could contribute.  Patient is legally blind.  Some concern about Keppra contributing.  Patient has been on [place] for over 5 years now.  Discussed with on-call neurology who did not feel Keppra is contributing.  Agitation and delirium seems to have resolved now.  He is fully oriented and appropriate. ?-Delirium precaution ?-Ensure euglycemia ?-Avoid sedating medications ? ?Hypothermia ?Unclear etiology of this but possible hypoperfusion from CHF.  Low suspicion for infectious process.  Resolved with warm blanket. ? ?Chronic combined systolic and diastolic congestive heart failure ([place]) ?TTE with LVEF<20% (previously 20 to 25%), G2 DD and RVSP of 60.4 mmHg.  Prior TTE in 12/2020 with LVEF 20 to  25%, G3 DD and RVSP of 55 mmHg.  BNP about 4400 (baseline) suggesting poor prognosis.  He is on p.o. Lasix.  He seems to be optimized on GDMT.  He is fairly compensated.  No respiratory distress.  Saturating at 98% on RA. ?-Continue home medications ?-Sodium and  fluid restriction to less than 2 g/day and 1500 cc/day respectively ?-Recommend palliative follow-up at SNF. ?-Outpatient follow-up with cardiology ? ?At risk for sleep apnea ?Noted to desaturate to mid 80s when he fell asleep this afternoon.  Recovers with deep breathing when he woke up. ?-Not sure if sleep study is beneficial given his poor prognosis ?-Avoid/minimize sedating medications. ? ?Pleural effusion due to CHF (congestive heart failure) ([place]) ?S/p left thoracocentesis with removal of 1.2 L pleural fluid.  Fluid was not sent for study since labs were not ordered at the time of thoracocentesis.  However, pleural effusion likely from his underlying advanced CHF than infectious process or malignancy.  Respiratory symptoms resolved.  Appears compensated. ?-Continue home diuretics ?-Encourage incentive spirometry ? ?Seizure ([place]) ?Stable.  He is on Keppra and low-dose Depakote.  Some concern about Keppra contributing to his agitation but felt to be less likely after discussion with neurology.  He has been on Keppra for over 5 years ?-Neurology recommended continuing Keppra and Depakote. ?-Neurology also recommended checking Depakote level given low dose.  However, patient has received Depakote this morning, and will not be due for trough until tonight.  This can be done outpatient. ? ?Stage 3a chronic kidney disease (CKD) ([place]) ?Recent Labs  ?  01/01/21 ?1302 01/02/21 ?0312 01/03/21 ?0420 01/04/21 ?0346 01/05/21 ?5809 01/18/21 ?1204 03/17/21 ?1650 03/21/21 ?1500 06/03/21 ?0800 06/04/21 ?0751  ?BUN 23 24* 23 25* 26* 57* 45* 42* 49* 51*  ?CREATININE 1.56* 1.65* 1.64* 1.75* 1.67* 2.11* 1.55* 1.60* 1.63* 1.47*  ?Continue monitoring ? ? ?PAD s/p bilateral BKA ?[place] has prosthesis. ?-Continue PT/OT at SNF ? ?Blindness ?Reports gradual vision loss.  He says he has seen ophthalmologist outpatient. ?-Outpatient follow-up with ophthalmology ? ?Controlled NIDDM-2 with hypoglycemia and CKD-3A ?A1c 6.3%.  He is on high-dose  Januvia and low-dose Jardiance.  ?Recent Labs  ?Lab 06/03/21 ?1727 06/03/21 ?1825 06/03/21 ?2029 06/04/21 ?0740 06/04/21 ?1153  ?GLUCAP 38* 71 124* 78 98  ?Hypoglycemia resolved with p.o.  ?Discontinue Januvia especially with his CKD. ?Continue low-dose Jardiance ?Continue statin. ? ? ?Goals of care, counseling/discussion ?Patient with end-stage CHF/biventricular failure, CKD, bilateral BKA and gradual visual loss.  Chronically elevated BNP.  Poor long-term prognosis.  DNR/DNI appropriate.  Likely candidate for hospice.  ?-Recommend palliative follow-up at SNF ? ?Essential hypertension ?Normotensive off cardiac medications. ?-Resume home cardiac meds ? ? ?Vital signs ?Vitals:  ? 06/03/21 2339 06/04/21 0346 06/04/21 0748 06/04/21 1008  ?BP: (!) 108/91 107/78    ?Pulse: 77 83    ?Temp: (!) 97.3 ?F (36.3 ?C) (!) 97.5 ?F (36.4 ?C) (!) 96.4 ?F (35.8 ?C) 98 ?F (36.7 ?C)  ?Resp: 18 18    ?Weight:      ?SpO2: 97% 98%    ?TempSrc: Rectal Oral Axillary Oral  ?  ? ?Discharge exam ? ?GENERAL: No apparent distress.  Nontoxic. ?HEENT: MMM.  Impaired vision/blindness.  Hearing grossly intact. ?NECK: Supple.  No apparent JVD.  ?RESP: 98% on RA.  No IWOB.  Fair aeration bilaterally. ?CVS:  RRR. Heart sounds normal.  ?ABD/GI/GU: BS+. Abd soft, NTND.  ?MSK/EXT:  Moves extremities.  Bilateral BKA.  Chronic scabbed wounds ?SKIN: no apparent skin lesion or wound ?  NEURO: Awake and alert. Oriented x4.  No apparent focal neuro deficit. ?PSYCH: Calm. Normal affect.  ? ?Discharge Instructions ?Discharge Instructions   ? ? Diet - low sodium heart healthy   Complete by: As directed ?  ? Diet Carb Modified   Complete by: As directed ?  ? Increase activity slowly   Complete by: As directed ?  ? ?  ? ?Allergies as of 06/04/2021   ?No Known Allergies ?  ? ?  ?Medication List  ?  ? ?STOP taking these medications   ? ?sitaGLIPtin 100 MG tablet ?Commonly known as: JANUVIA ?  ? ?  ? ?TAKE these medications   ? ?acetaminophen 325 MG tablet ?Commonly known  as: TYLENOL ?Take 650 mg by mouth every 6 (six) hours as needed for mild pain (AND CANNOT EXCEED 3,000 MG/24 HOURS OF TYLENOL FROM ALL COMBINED SOURCES). ?  ?Altalube 85-15 % Oint ?Place 1 application into bo

## 2021-06-04 NOTE — Assessment & Plan Note (Signed)
Reports gradual vision loss.  He says he has seen ophthalmologist outpatient. ?-Outpatient follow-up with ophthalmology ?

## 2021-06-04 NOTE — Plan of Care (Signed)
  Problem: Safety: Goal: Ability to remain free from injury will improve Outcome: Progressing   

## 2021-06-04 NOTE — Assessment & Plan Note (Addendum)
Patient with end-stage CHF/biventricular failure, CKD, bilateral BKA and gradual visual loss.  Chronically elevated BNP.  Poor long-term prognosis.  DNR/DNI appropriate.  Likely candidate for hospice.  ?-Recommend palliative follow-up at SNF ?

## 2021-06-04 NOTE — Progress Notes (Signed)
[facility]) Hospital Liaison note: ? ?Notified by [facility] Evette Cristal, LCSW of request for [place] services. Will continue to follow for disposition. ? ?Please call with any outpatient palliative questions or concerns. ? ?Thank you for the opportunity to participate in this patient's care. ? ?Thank you, ?Lorelee Market, LPN ?[facility] Hospital Liaison ?[phone] ?

## 2021-06-04 NOTE — Progress Notes (Signed)
OT Cancellation Note ? ?Patient Details ?Name: [name] ?MRN: [id number] ?DOB: 15-Jul-1955 ? ? ?Cancelled Treatment:    Reason Eval/Treat Not Completed: Other (comment) ?Per chart review, patient is a long term care resident at SNF with caregiver assistance. All skilled OT needs can be met at the next level of care as needed. Defer skilled OT evaluation to next level of care.  ?Zacharias Ridling OTR/L, MS ?Acute Rehabilitation Department ?Office# [phone] ?Pager# [phone] ? ?06/04/2021, 2:32 PM ?

## 2021-06-04 NOTE — Progress Notes (Signed)
Report called to Hanley Ben at [facility]. PTAR forgot patient's AVS so informed Amy that RN would fax. All questions answered and pt left via stretcher in NAD. Pt wearing paper scrubs and a gray watch on his left wrist at time of D/C. Patient's sister Danton Clap called and informed of the transfer.  ?

## 2021-06-04 NOTE — Progress Notes (Addendum)
OT Cancellation Note ? ?Patient Details ?Name: [name] ?MRN: [id number] ?DOB: Nov 06, 1955 ? ? ?Cancelled Treatment:    Reason Eval/Treat Not Completed: Patient not medically ready ?Per nurse report patient is currently in deep sleep after Ativan administration. OT to continue to follow and check back when patient is more alert.  ?Iverson Sees OTR/L, MS ?Acute Rehabilitation Department ?Office# [phone] ?Pager# [phone] ? ? ?06/04/2021, 8:00 AM ?

## 2021-06-04 NOTE — Assessment & Plan Note (Signed)
[place] has prosthesis. ?-Continue PT/OT at SNF ?

## 2021-06-04 NOTE — Assessment & Plan Note (Addendum)
S/p left thoracocentesis with removal of 1.2 L pleural fluid.  Fluid was not sent for study since labs were not ordered at the time of thoracocentesis.  However, pleural effusion likely from his underlying advanced CHF than infectious process or malignancy.  Respiratory symptoms resolved.  Appears compensated. ?-Continue home diuretics ?-Encourage incentive spirometry ?

## 2021-06-04 NOTE — Progress Notes (Signed)
PT Cancellation Note ? ?Patient Details ?Name: [name] ?MRN: [id number] ?DOB: September 01, 1955 ? ? ?Cancelled Treatment:     Per chart review, patient is a long term care resident at SNF with caregiver assistance. All skilled PT needs can be met at the next level of care as needed. Defer skilled PT evaluation to next level of care. ? ? ?Jewelle Whitner ?06/04/2021, 3:32 PM ?

## 2021-06-04 NOTE — Assessment & Plan Note (Addendum)
A1c 6.3%.  He is on high-dose Januvia and low-dose Jardiance.  ?Recent Labs  ?Lab 06/03/21 ?1727 06/03/21 ?1825 06/03/21 ?2029 06/04/21 ?0740 06/04/21 ?1153  ?GLUCAP 38* 71 124* 78 98  ?Hypoglycemia resolved with p.o.  ?Discontinue Januvia especially with his CKD. ?Continue low-dose Jardiance ?Continue statin. ? ?

## 2021-06-04 NOTE — Hospital Course (Addendum)
66 year old M with PMH of combined CHF (EF 20 to 25%, G3 DD and RVSP of 55 mmHg), bilateral BKA, NIDDM-2, seizure disorder, HTN, HLD and gradual vision loss presenting with chest pain, cough, agitation and combativeness.  ? ?In ED, vitals stable.  Normal saturation on RA.  K5.3. Cr 1.63 (about baseline)..  BUN 49.  CBC with differential and UA without significant finding.  CXR with subtotal opacification of left hemithorax due to progressive large left pleural effusion.  Troponin 15>> 75.  EKG sinus rhythm with occasional PVCs and QTc to 510.  BNP 4400 (about baseline).  Thoracocentesis ordered by EDP.  1.2 L pleural fluid drained.  Unfortunately, pleural fluid studies were not ordered at the time of thoracocentesis, and samples were not sent.  Patient was admitted for chest pain/elevated troponin and cough. ? ?Patient had an episode of mild hypothermia to 95.5 ?F overnight that has resolved with warm blanket.  He also had an episode of confusion and agitation that has resolved this morning.   ? ?At the time of my evaluation later this morning, awake and oriented x4.  Chest pain resolved.  Denies dyspnea, cough, GI or UTI symptoms.  Vitals remained stable.  Saturating at 98% on room air. ?

## 2021-06-04 NOTE — Assessment & Plan Note (Addendum)
TTE with LVEF<20% (previously 20 to 25%), G2 DD and RVSP of 60.4 mmHg.  Prior TTE in 12/2020 with LVEF 20 to 25%, G3 DD and RVSP of 55 mmHg.  BNP about 4400 (baseline) suggesting poor prognosis.  He is on p.o. Lasix.  He seems to be optimized on GDMT.  He is fairly compensated.  No respiratory distress.  Saturating at 98% on RA. ?-Continue home medications ?-Sodium and fluid restriction to less than 2 g/day and 1500 cc/day respectively ?-Recommend palliative follow-up at SNF. ?-Outpatient follow-up with cardiology ?

## 2021-06-04 NOTE — Assessment & Plan Note (Signed)
Recent Labs  ?  01/01/21 ?1302 01/02/21 ?0312 01/03/21 ?0420 01/04/21 ?0346 01/05/21 ?9688 01/18/21 ?1204 03/17/21 ?1650 03/21/21 ?1500 06/03/21 ?0800 06/04/21 ?0751  ?BUN 23 24* 23 25* 26* 57* 45* 42* 49* 51*  ?CREATININE 1.56* 1.65* 1.64* 1.75* 1.67* 2.11* 1.55* 1.60* 1.63* 1.47*  ?Continue monitoring ? ?

## 2021-06-04 NOTE — Assessment & Plan Note (Addendum)
Unclear etiology of this but possible hypoperfusion from CHF.  Low suspicion for infectious process.  Resolved with warm blanket. ?

## 2021-06-04 NOTE — Assessment & Plan Note (Signed)
Stable.  He is on Keppra and low-dose Depakote.  Some concern about Keppra contributing to his agitation but felt to be less likely after discussion with neurology.  He has been on Keppra for over 5 years ?-Neurology recommended continuing Keppra and Depakote. ?-Neurology also recommended checking Depakote level given low dose.  However, patient has received Depakote this morning, and will not be due for trough until tonight.  This can be done outpatient. ?

## 2021-06-04 NOTE — TOC Transition Note (Addendum)
Transition of Care (TOC) - CM/SW Discharge Note ? ? ?Patient Details  ?Name: [name] ?MRN: [id number] ?Date of Birth: 15-Jul-1955 ? ?Transition of Care (TOC) CM/SW Contact:  ?Ross Ludwig, LCSW ?Phone Number: ?06/04/2021, 2:47 PM ? ? ?Clinical Narrative:    ? ?CSW was informed that patient is a LTC resident at [facility].  CSW contacted [name] and they said patient can return today, and to fax the discharge summary to [phone].  CSW faxed required documentation to SNF.  Patient to be d/c'ed today to [facility] room 153.  Patient and family agreeable to plans will transport via ems RN to call report [phone].  CSW attempted to contact sister Danton Clap, however unable to leave a message.  Physician recommending outpatient palliative to follow at SNF.  CSW gave referral to [facility] to follow patient.  CSW signing off, patient to discharge back to SNF today. ? ? ? ? ?Final next level of care: [facility] ?Barriers to Discharge: Barriers Resolved ? ? ?Patient Goals and CMS Choice ?Patient states their goals for this hospitalization and ongoing recovery are:: To return back to [facility] ?CMS Medicare.gov Compare Post Acute Care list provided to:: Patient ?Choice offered to / list presented to : Patient ? ?Discharge Placement ?  ?Existing PASRR number confirmed : 06/04/21          ?Patient chooses bed at: Other - please specify in the comment section below: ?Patient to be transferred to facility by: [facility] ?Name of family member notified: Attempted to leave a message on sister Alice's phone, but unable to. ?Patient and family notified of of transfer: 06/04/21 ? ?Discharge Plan and Services ?  ?  ?           ?  ?  ?  ?  ?  ?  ?  ?  ?  ?  ? ?Social Determinants of Health (SDOH) Interventions ?  ? ? ?Readmission Risk Interventions ?   ? View : No data to display.  ?  ?  ?  ? ? ? ? ? ?

## 2021-06-04 NOTE — Assessment & Plan Note (Signed)
Noted to desaturate to mid 80s when he fell asleep this afternoon.  Recovers with deep breathing when he woke up. ?-Not sure if sleep study is beneficial given his poor prognosis ?-Avoid/minimize sedating medications. ?

## 2021-06-04 NOTE — NC FL2 (Signed)
?[place] MEDICAID FL2 LEVEL OF CARE SCREENING TOOL  ?  ? ?IDENTIFICATION  ?Patient Name: ?[name] Birthdate: December 06, 1955 Sex: male Admission Date (Current Location): ?06/03/2021  ?[place] and [place] Number: ? [place] ?[id number] P Facility and Address:  ?[facility],  [place], [place] ?     Provider Number: ?[id number]  ?Attending Physician Name and Address:  ?Mercy Riding, MD ? Relative Name and Phone Number:  ?Delbert Phenix Sister [phone]  [phone]  Laurena Spies Sister   [phone] ?   ?Current Level of Care: ?Hospital Recommended Level of Care: ?  Prior Approval Number: ?  ? ?Date Approved/Denied: ?  PASRR Number: ?[id number] B ? ?Discharge Plan: ?SNF ?  ? ?Current Diagnoses: ?Patient Active Problem List  ? Diagnosis Date Noted  ? Delirium and agitation 06/04/2021  ? Agitation 06/04/2021  ? Goals of care, counseling/discussion 06/04/2021  ? At risk for sleep apnea 06/04/2021  ? Chest pain and elevated troponin 06/03/2021  ? HFrEF (heart failure with reduced ejection fraction) ([place]) 03/30/2021  ? Pleural effusion due to CHF (congestive heart failure) ([place]) 12/29/2020  ? Elevated troponin 12/17/2020  ? Malnutrition of moderate degree 06/24/2020  ? Type 2 diabetes mellitus with diabetic peripheral angiopathy and gangrene, without long-term current use of insulin (HCC)   ? Necrotizing fasciitis of lower leg ([place]) 06/22/2020  ? Type 2 diabetes mellitus with foot ulcer and gangrene ([place])   ? Abscess of left foot   ? Foot infection   ? Focal motor seizure disorder ([place]) 05/06/2017  ? Irregular heart rate   ? Acute encephalopathy 01/31/2017  ? Cellulitis of right upper extremity   ? Anasarca   ? Altered mental status   ? Slurred speech   ? Hypothermia 01/30/2017  ? Pressure injury of skin 12/21/2016  ? CHF exacerbation ([place]) 12/20/2016  ? Peripheral edema   ? Benign prostatic hyperplasia with lower urinary tract symptoms   ? Embolic stroke ([place]) 46/27/0350  ? Cardiomyopathy,  dilated ([place]) 10/19/2016  ? Acute on chronic systolic heart failure, NYHA class 3 ([place]) 10/19/2016  ? Moderate to severe pulmonary hypertension ([place]) 10/19/2016  ? Severe tricuspid regurgitation by prior echocardiogram 10/19/2016  ? Diabetes mellitus type 2 in obese ([place]) 10/19/2016  ? HLD (hyperlipidemia) 10/19/2016  ? Seizure ([place]) 10/19/2016  ? Fluid overload 10/14/2016  ? 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 Cardiorenal syndrome with renal failure 09/28/2016  ? Acute urinary retention 09/26/2016  ? Acute on chronic combined systolic and diastolic CHF (congestive heart failure) ([place]) 09/26/2016  ? Scrotal edema 09/24/2016  ? Multiple open wounds of lower leg, initial encounter 09/24/2016  ? Tinea of groin 09/24/2016  ? Stage 3a chronic kidney disease (CKD) ([place]) 06/07/2016  ? Chronic combined systolic and diastolic congestive heart failure ([place])   ? Protein calorie malnutrition ([place]) 07/12/2015  ? Congestive dilated cardiomyopathy ([place]) 07/11/2015  ? Pressure ulcer 07/09/2015  ? History of right below knee amputation ([place]) 02/22/2015  ? PAD s/p bilateral BKA 02/22/2015  ? Blindness 01/04/2015  ? Complications, amputation stump late ([place]) 08/06/2014  ? Diabetic neuropathy associated with type 2 diabetes mellitus ([place]) 12/01/2013  ? Noncompliance with medications 07/13/2010  ? OTHER SPEC TYPES SCHIZOPHRENIA UNSPEC CONDITION 05/24/2009  ? Obesity, unspecified 06/09/2007  ? Controlled NIDDM-2 with hypoglycemia and CKD-3A 05/02/2006  ? Essential hypertension 05/02/2006  ? ? ?Orientation RESPIRATION BLADDER Height & Weight   ?  ?Self, Place ? Normal  Incontinent Weight: 188 lb 4.4 oz (85.4 kg) ?Height:  6' (182.9 cm)  ?BEHAVIORAL SYMPTOMS/MOOD NEUROLOGICAL BOWEL NUTRITION STATUS  ?    Incontinent Diet (Carb Modified)  ?AMBULATORY STATUS COMMUNICATION OF NEEDS Skin   ?Extensive Assist Verbally Normal ?  ?  ?  ?    ?     ?     ? ? ?Personal Care  Assistance Level of Assistance  ?Bathing, Feeding, Dressing Bathing Assistance: Maximum assistance ?Feeding assistance: Limited assistance ?Dressing Assistance: Maximum assistance ?   ? ?Functional Limitations Info  ?Sight, Hearing, Speech Sight Info: Impaired ?Hearing Info: Adequate ?Speech Info: Adequate  ? ? ?SPECIAL CARE FACTORS FREQUENCY  ?    ?  ?  ?  ?  ?  ?  ?   ? ? ?Contractures    ? ? ?Additional Factors Info  ?Code Status, Allergies, Psychotropic, Insulin Sliding Scale Code Status Info: DNR ?Allergies Info: NKA ?Psychotropic Info: divalproex (DEPAKOTE) DR tablet 125 mg, risperiDONE (RISPERDAL) 1 MG/ML oral solution 2 mg, sertraline (ZOLOFT) tablet 50 mg ?Insulin Sliding Scale Info: insulin aspart (novoLOG) injection 0-9 Units 3x a day with meals ?  ?   ? ?Current Medications (06/04/2021):  This is the current hospital active medication list ?Current Facility-Administered Medications  ?Medication Dose Route Frequency Provider Last Rate Last Admin  ? acetaminophen (TYLENOL) tablet 650 mg  650 mg Oral Q4H PRN Cherylann Ratel A, DO      ? Altalube 36-64 % OINT 1 application.  1 application. Both Eyes QHS Mercy Riding, MD      ? aspirin EC tablet 81 mg  81 mg Oral Daily Gonfa, Taye T, MD      ? atorvastatin (LIPITOR) tablet 40 mg  40 mg Oral QHS Gonfa, Taye T, MD      ? bisoprolol (ZEBETA) tablet 10 mg  10 mg Oral Daily Mercy Riding, MD      ? Derrill Memo ON 06/05/2021] Chlorhexidine Gluconate Cloth 2 % PADS 6 each  6 each Topical Q0600 Gonfa, Taye T, MD      ? divalproex (DEPAKOTE) DR tablet 125 mg  125 mg Oral Q12H Gonfa, Taye T, MD      ? empagliflozin (JARDIANCE) tablet 10 mg  10 mg Oral Daily Wendee Beavers T, MD      ? enoxaparin (LOVENOX) injection 40 mg  40 mg Subcutaneous Q24H Kyle, Tyrone A, DO   40 mg at 06/03/21 2301  ? furosemide (LASIX) tablet 40 mg  40 mg Oral Daily Gonfa, Taye T, MD      ? insulin aspart (novoLOG) injection 0-5 Units  0-5 Units Subcutaneous QHS Kyle, Tyrone A, DO      ? insulin aspart  (novoLOG) injection 0-9 Units  0-9 Units Subcutaneous TID WC Kyle, Tyrone A, DO      ? levETIRAcetam (KEPPRA) tablet 750 mg  750 mg Oral BID Wendee Beavers T, MD   750 mg at 06/04/21 1237  ? LORazepam (ATIVAN) injection 1 mg  1 mg Intravenous Q6H PRN Marylyn Ishihara, Tyrone A, DO   1 mg at 06/03/21 2301  ? mupirocin ointment (BACTROBAN) 2 % 1 application.  1 application. Nasal BID Mercy Riding, MD      ? Netarsudil Dimesylate 0.02 % SOLN 1 drop  1 drop Left Eye QHS Gonfa, Taye T, MD      ? ondansetron (ZOFRAN) injection 4 mg  4 mg Intravenous Q6H PRN Marylyn Ishihara, Tyrone A, DO      ? [START  ON 06/05/2021] risperiDONE (RISPERDAL) 1 MG/ML oral solution 2 mg  2 mg Oral q AM Mercy Riding, MD      ? [START ON 06/05/2021] sertraline (ZOLOFT) tablet 50 mg  50 mg Oral q AM Mercy Riding, MD      ? ? ? ?Discharge Medications: ?Please see discharge summary for a list of discharge medications. ? ?Relevant Imaging Results: ? ?Relevant Lab Results: ? ? ?Additional Information ?SSN [id number] ? ?Ross Ludwig, LCSW ? ? ? ? ?

## 2021-06-04 NOTE — Assessment & Plan Note (Addendum)
Confusion and agitation likely delirium.  He was also hypoglycemic to 38 which could contribute.  Patient is legally blind.  Some concern about Keppra contributing.  Patient has been on [place] for over 5 years now.  Discussed with on-call neurology who did not feel Keppra is contributing.  Agitation and delirium seems to have resolved now.  He is fully oriented and appropriate. ?-Delirium precaution ?-Ensure euglycemia ?-Avoid sedating medications ?

## 2021-06-04 NOTE — Assessment & Plan Note (Addendum)
Doubt ACS.  Troponin 15> 75> 72> 76, likely demand ischemia and delayed clearance.  EKG without acute ischemic finding.  Chest pain resolved.  TTE with LVEF<20% (previously 20 to 25%), G2 DD and RVSP of 60.4 mmHg.  Hemodynamically stable. ?-Continue home cardiac meds ?

## 2021-06-04 NOTE — Assessment & Plan Note (Signed)
Normotensive off cardiac medications. ?-Resume home cardiac meds ?

## 2021-06-14 ENCOUNTER — Encounter (HOSPITAL_COMMUNITY): Payer: Medicaid Other | Admitting: Internal Medicine

## 2021-06-14 ENCOUNTER — Ambulatory Visit (HOSPITAL_COMMUNITY): Payer: Medicaid Other

## 2021-06-20 ENCOUNTER — Non-Acute Institutional Stay: Payer: Medicaid Other | Admitting: Hospice

## 2021-06-20 DIAGNOSIS — G40909 Epilepsy, unspecified, not intractable, without status epilepticus: Secondary | ICD-10-CM

## 2021-06-20 DIAGNOSIS — Z515 Encounter for palliative care: Secondary | ICD-10-CM

## 2021-06-20 DIAGNOSIS — E1152 Type 2 diabetes mellitus with diabetic peripheral angiopathy with gangrene: Secondary | ICD-10-CM

## 2021-06-20 DIAGNOSIS — I5042 Chronic combined systolic (congestive) and diastolic (congestive) heart failure: Secondary | ICD-10-CM

## 2021-06-20 DIAGNOSIS — R451 Restlessness and agitation: Secondary | ICD-10-CM

## 2021-06-20 NOTE — Progress Notes (Signed)
? ? ?Manufacturing engineer ?Community Palliative Care Consult Note ?Telephone: [phone]  ?Fax: [phone] ? ?PATIENT NAME: [name] ?[place] ?[address] ?[place] [address] ?[phone] (home)  ?DOB: 26-Dec-1955 ?MRN: [id number] ? ?PRIMARY CARE PROVIDER:    ?[name], MD,  ?[address] ?[place] [address] ?[phone] ? ?REFERRING PROVIDER:   ?[name], MD ?[place] ?[address] ?[place],  [place] [address] ?[phone] ? ?RESPONSIBLE PARTY:   Sister ?Contact Information   ? ? Name Relation Home Work Mobile  ? [facility] Sister [phone]  [phone]  ? Tucker,Jake Sister [phone]  [phone]  ? ?  ? ? ? ?I met face to face with patient at facility. Visit to build trust and highlight Palliative Medicine as specialized medical care for people living with serious illness, aimed at facilitating better quality of life through symptoms relief, assisting with advance care planning and complex medical decision making.  Patient endorsed palliative service.  NP called Alice and left her a voicemail with callback number. ?ASSESSMENT AND / RECOMMENDATIONS:  ? ?Advance Care Planning: Our advance care planning conversation included a discussion about:    ?The value and importance of advance care planning  ?Difference between Hospice and Palliative care ?Exploration of goals of care in the event of a sudden injury or illness  ?Identification and preparation of a healthcare agent  ?Review and updating or creation of an  advance directive document . ?Decision not to resuscitate or to de-escalate disease focused treatments due to poor prognosis. ? ?CODE STATUS: Patient is a Do Not Resuscitate ? ?Goals of Care: Goals include to maximize quality of life and symptom management ? ?I spent 16 minutes providing this initial consultation. More than 50% of the time in this consultation was spent on counseling patient and coordinating  communication. ?-------------------------------------------------------------------------------------------------------------------------------------- ? ?Symptom Management/Plan: ?Agitation/confusion: Was seen in the ED 4/1 - 06/04/2021, likely related to hypoglycemia of 38.  Patient was treated/discharged, now euglycemic and back to baseline.  History of delusional disorder. Managed with resperidone and deparkote. Followed by Psych. Use redirection/deescalation techniques. ?Seizure disorder: Managed with Keppra,  Follow up with Neurologist as planned. ?Type 2 DM: Current A1c 6.3 06/03/21. Continue Jardiance as ordered. Diabetic diet. No concentrated sweets. Monitor blood sugar level as ordered. Monitor for s/s of hypoglycemia/hyperglycemia.  Repeat A1c in 3 months ?CHF: Chronic, diastolic/systolic. Managed with Furosemide, Aldactone. Adhere to salt and fluid limits. Continue to monitor weight as ordered, report weight gain of 2 Ibs in a day or 5 Ibs in a week.   ? ?Follow up: Palliative care will continue to follow for complex medical decision making, advance care planning, and clarification of goals. Return 6 weeks or prn. Encouraged to call provider sooner with any concerns.  ? ?Family /Caregiver/Community Supports: Patient in SNF for ongoing care ? ?HOSPICE ELIGIBILITY/DIAGNOSIS: TBD ? ?Chief Complaint: Initial Palliative care visit ? ?HISTORY OF PRESENT ILLNESS:  [name] is a 66 y.o. year old male  with multiple morbidities requiring close monitoring and with high risk of complications and  mortality: Combined diastolic and systolic congestive heart failure, type 2 diabetes mellitus, bilateral below the knee amputation, seizure disorder, agitation, delusional disorder.  Patient denied pain/discomfort ?History obtained from review of EMR, discussion with primary team, caregiver, family and/or [name].  ?Review and summarization of Epic records shows history from other than patient. Rest of 10 point ROS  asked and negative. Independent interpretation of tests and reviewed as needed, available labs,  patient records, imaging, studies and related documents from the EMR. ? ? ?Physical Exam: ?Constitutional: NAD ?General: Well groomed, cooperative ?EYES: anicteric sclera, lids intact, no discharge  ?ENMT: Moist mucous membrane ?CV: S1 S2, RRR,  ?Pulmonary: LCTA, no increased work of breathing, no cough, ?Abdomen: active BS + 4 quadrants, soft and non tender ?GU: no suprapubic tenderness ?MSK: weakness,  limited ROM, bilateral BKA ?Skin: warm and dry, no rashes or wounds on visible skin ?Neuro:  weakness, otherwise non focal ?Psych: non-anxious affect ?Hem/lymph/immuno: no widespread bruising ? ? ?PAST MEDICAL HISTORY:  ?Active Ambulatory Problems  ?  Diagnosis Date Noted  ? Controlled NIDDM-2 with hypoglycemia and CKD-3A 05/02/2006  ? Obesity, unspecified 06/09/2007  ? OTHER SPEC TYPES SCHIZOPHRENIA UNSPEC CONDITION 05/24/2009  ? Essential hypertension 05/02/2006  ? Noncompliance with medications 07/13/2010  ? Diabetic neuropathy associated with type 2 diabetes mellitus ([place]) 12/01/2013  ? Complications, amputation stump late ([place]) 08/06/2014  ? Blindness 01/04/2015  ? History of right below knee amputation ([place]) 02/22/2015  ? PAD s/p bilateral BKA 02/22/2015  ? Pressure ulcer 07/09/2015  ? Congestive dilated cardiomyopathy ([place]) 07/11/2015  ? Protein calorie malnutrition ([place]) 07/12/2015  ? Chronic combined systolic and diastolic congestive heart failure ([place])   ? Stage 3a chronic kidney disease (CKD) ([place]) 06/07/2016  ? Scrotal edema 09/24/2016  ? Multiple open wounds of lower leg, initial encounter 09/24/2016  ? Tinea of groin 09/24/2016  ? Acute urinary retention 09/26/2016  ? Acute on chronic combined systolic and diastolic CHF (congestive heart failure) ([place]) 09/26/2016  ? Cardiorenal syndrome with renal failure 09/28/2016  ? 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 Fluid overload 10/14/2016  ? Embolic stroke ([place]) 41/28/7867  ? Cardiomyopathy, dilated ([place]) 10/19/2016  ? Acute on chronic systolic heart failure, NYHA class 3 ([place]) 10/19/2016  ? Moderate to severe pulmonary hypertension ([place]) 10/19/2016  ? Severe tricuspid regurgitation by prior echocardiogram 10/19/2016  ? Diabetes mellitus type 2 in obese ([place]) 10/19/2016  ? HLD (hyperlipidemia) 10/19/2016  ? Seizure ([place]) 10/19/2016  ? CHF exacerbation ([place]) 12/20/2016  ? Peripheral edema   ? Benign prostatic hyperplasia with lower urinary tract symptoms   ? Pressure injury of skin 12/21/2016  ? Hypothermia 01/30/2017  ? Cellulitis of right upper extremity   ? Anasarca   ? Altered mental status   ? Slurred speech   ? Acute encephalopathy 01/31/2017  ? Irregular heart rate   ? 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 Type 2 diabetes mellitus with foot ulcer and gangrene ([place])   ? Abscess of left foot   ? Foot infection   ? Necrotizing fasciitis of lower leg ([place]) 06/22/2020  ? Type 2 diabetes mellitus with diabetic peripheral angiopathy and gangrene, without long-term current use of insulin (HCC)   ? Malnutrition of moderate degree 06/24/2020  ? Elevated troponin 12/17/2020  ? Pleural effusion due to CHF (congestive heart failure) ([place]) 12/29/2020  ? HFrEF (heart failure with reduced ejection fraction) ([place]) 03/30/2021  ? Chest pain and elevated troponin 06/03/2021  ? Delirium and agitation 06/04/2021  ? Agitation 06/04/2021  ? Goals of care, counseling/discussion 06/04/2021  ? At risk for sleep apnea 06/04/2021  ? ?Resolved Ambulatory Problems  ?  Diagnosis Date Noted  ? RENAL FAILURE, ACUTE 10/01/2007  ? KNEE PAIN, BILATERAL 07/14/2009  ? Red eye 04/25/2012  ? Left eye pain 08/05/2013  ? Abdominal pain, unspecified site 09/06/2013  ? Cellulitis 09/19/2013  ?  Hx of BKA ([place]) 09/28/2013  ? Hypertensive urgency 08/06/2014  ? Complication of amputated stump ([place]) 04/29/2015  ? Post-operative pain   ?  Non compliance w medication regimen   ? Acute blood loss anemia   ? AKI (acute kidney injury) ([place])   ? Open knee wound 07/06/2015  ? CHF (congestive heart failure) ([place]) 07/08/2015  ? Dyspnea 07/09/2015  ? Elevated troponin   ? Scrotal swel

## 2021-07-02 ENCOUNTER — Emergency Department (HOSPITAL_COMMUNITY): Payer: Medicaid Other

## 2021-07-02 ENCOUNTER — Inpatient Hospital Stay (HOSPITAL_COMMUNITY)
Admission: EM | Admit: 2021-07-02 | Discharge: 2021-07-03 | DRG: 380 | Disposition: E | Payer: Medicaid Other | Source: Skilled Nursing Facility | Attending: Internal Medicine | Admitting: Internal Medicine

## 2021-07-02 ENCOUNTER — Encounter (HOSPITAL_COMMUNITY): Payer: Self-pay | Admitting: Emergency Medicine

## 2021-07-02 DIAGNOSIS — I130 Hypertensive heart and chronic kidney disease with heart failure and stage 1 through stage 4 chronic kidney disease, or unspecified chronic kidney disease: Secondary | ICD-10-CM | POA: Diagnosis present

## 2021-07-02 DIAGNOSIS — I9589 Other hypotension: Secondary | ICD-10-CM | POA: Diagnosis not present

## 2021-07-02 DIAGNOSIS — Z79899 Other long term (current) drug therapy: Secondary | ICD-10-CM | POA: Diagnosis not present

## 2021-07-02 DIAGNOSIS — D62 Acute posthemorrhagic anemia: Secondary | ICD-10-CM | POA: Diagnosis present

## 2021-07-02 DIAGNOSIS — Z89512 Acquired absence of left leg below knee: Secondary | ICD-10-CM | POA: Diagnosis not present

## 2021-07-02 DIAGNOSIS — K311 Adult hypertrophic pyloric stenosis: Principal | ICD-10-CM | POA: Diagnosis present

## 2021-07-02 DIAGNOSIS — I420 Dilated cardiomyopathy: Secondary | ICD-10-CM | POA: Diagnosis present

## 2021-07-02 DIAGNOSIS — I509 Heart failure, unspecified: Secondary | ICD-10-CM

## 2021-07-02 DIAGNOSIS — Z87891 Personal history of nicotine dependence: Secondary | ICD-10-CM

## 2021-07-02 DIAGNOSIS — G40909 Epilepsy, unspecified, not intractable, without status epilepticus: Secondary | ICD-10-CM | POA: Diagnosis present

## 2021-07-02 DIAGNOSIS — E1169 Type 2 diabetes mellitus with other specified complication: Secondary | ICD-10-CM

## 2021-07-02 DIAGNOSIS — Z7984 Long term (current) use of oral hypoglycemic drugs: Secondary | ICD-10-CM

## 2021-07-02 DIAGNOSIS — R188 Other ascites: Secondary | ICD-10-CM | POA: Diagnosis present

## 2021-07-02 DIAGNOSIS — I5020 Unspecified systolic (congestive) heart failure: Secondary | ICD-10-CM | POA: Diagnosis present

## 2021-07-02 DIAGNOSIS — I959 Hypotension, unspecified: Secondary | ICD-10-CM | POA: Diagnosis present

## 2021-07-02 DIAGNOSIS — Z66 Do not resuscitate: Secondary | ICD-10-CM | POA: Diagnosis present

## 2021-07-02 DIAGNOSIS — E1151 Type 2 diabetes mellitus with diabetic peripheral angiopathy without gangrene: Secondary | ICD-10-CM | POA: Diagnosis present

## 2021-07-02 DIAGNOSIS — E861 Hypovolemia: Secondary | ICD-10-CM

## 2021-07-02 DIAGNOSIS — R0902 Hypoxemia: Secondary | ICD-10-CM | POA: Diagnosis present

## 2021-07-02 DIAGNOSIS — Z89511 Acquired absence of right leg below knee: Secondary | ICD-10-CM

## 2021-07-02 DIAGNOSIS — Z7982 Long term (current) use of aspirin: Secondary | ICD-10-CM

## 2021-07-02 DIAGNOSIS — R578 Other shock: Secondary | ICD-10-CM | POA: Diagnosis present

## 2021-07-02 DIAGNOSIS — Z515 Encounter for palliative care: Secondary | ICD-10-CM | POA: Diagnosis not present

## 2021-07-02 DIAGNOSIS — I5042 Chronic combined systolic (congestive) and diastolic (congestive) heart failure: Secondary | ICD-10-CM | POA: Diagnosis present

## 2021-07-02 DIAGNOSIS — E669 Obesity, unspecified: Secondary | ICD-10-CM | POA: Diagnosis present

## 2021-07-02 DIAGNOSIS — N1831 Chronic kidney disease, stage 3a: Secondary | ICD-10-CM | POA: Diagnosis present

## 2021-07-02 DIAGNOSIS — R933 Abnormal findings on diagnostic imaging of other parts of digestive tract: Secondary | ICD-10-CM

## 2021-07-02 DIAGNOSIS — I739 Peripheral vascular disease, unspecified: Secondary | ICD-10-CM | POA: Diagnosis not present

## 2021-07-02 DIAGNOSIS — E119 Type 2 diabetes mellitus without complications: Secondary | ICD-10-CM | POA: Diagnosis present

## 2021-07-02 DIAGNOSIS — E43 Unspecified severe protein-calorie malnutrition: Secondary | ICD-10-CM | POA: Diagnosis present

## 2021-07-02 DIAGNOSIS — Z794 Long term (current) use of insulin: Secondary | ICD-10-CM

## 2021-07-02 DIAGNOSIS — E8809 Other disorders of plasma-protein metabolism, not elsewhere classified: Secondary | ICD-10-CM | POA: Diagnosis present

## 2021-07-02 DIAGNOSIS — E1140 Type 2 diabetes mellitus with diabetic neuropathy, unspecified: Secondary | ICD-10-CM | POA: Diagnosis present

## 2021-07-02 DIAGNOSIS — K920 Hematemesis: Secondary | ICD-10-CM | POA: Diagnosis present

## 2021-07-02 DIAGNOSIS — Z809 Family history of malignant neoplasm, unspecified: Secondary | ICD-10-CM

## 2021-07-02 DIAGNOSIS — Z8249 Family history of ischemic heart disease and other diseases of the circulatory system: Secondary | ICD-10-CM

## 2021-07-02 DIAGNOSIS — E785 Hyperlipidemia, unspecified: Secondary | ICD-10-CM | POA: Diagnosis present

## 2021-07-02 DIAGNOSIS — E1122 Type 2 diabetes mellitus with diabetic chronic kidney disease: Secondary | ICD-10-CM | POA: Diagnosis present

## 2021-07-02 LAB — CBC WITH DIFFERENTIAL/PLATELET
Abs Immature Granulocytes: 0.03 10*3/uL (ref 0.00–0.07)
Basophils Absolute: 0 10*3/uL (ref 0.0–0.1)
Basophils Relative: 0 %
Eosinophils Absolute: 0 10*3/uL (ref 0.0–0.5)
Eosinophils Relative: 0 %
HCT: 31.2 % — ABNORMAL LOW (ref 39.0–52.0)
Hemoglobin: 9.4 g/dL — ABNORMAL LOW (ref 13.0–17.0)
Immature Granulocytes: 0 %
Lymphocytes Relative: 6 %
Lymphs Abs: 0.5 10*3/uL — ABNORMAL LOW (ref 0.7–4.0)
MCH: 26.2 pg (ref 26.0–34.0)
MCHC: 30.1 g/dL (ref 30.0–36.0)
MCV: 86.9 fL (ref 80.0–100.0)
Monocytes Absolute: 0.6 10*3/uL (ref 0.1–1.0)
Monocytes Relative: 8 %
Neutro Abs: 6.7 10*3/uL (ref 1.7–7.7)
Neutrophils Relative %: 86 %
Platelets: 174 10*3/uL (ref 150–400)
RBC: 3.59 MIL/uL — ABNORMAL LOW (ref 4.22–5.81)
RDW: 24.6 % — ABNORMAL HIGH (ref 11.5–15.5)
WBC: 7.8 10*3/uL (ref 4.0–10.5)
nRBC: 0 % (ref 0.0–0.2)

## 2021-07-02 LAB — COMPREHENSIVE METABOLIC PANEL
ALT: 14 U/L (ref 0–44)
AST: 17 U/L (ref 15–41)
Albumin: 1.6 g/dL — ABNORMAL LOW (ref 3.5–5.0)
Alkaline Phosphatase: 65 U/L (ref 38–126)
Anion gap: 3 — ABNORMAL LOW (ref 5–15)
BUN: 24 mg/dL — ABNORMAL HIGH (ref 8–23)
CO2: 30 mmol/L (ref 22–32)
Calcium: 7.8 mg/dL — ABNORMAL LOW (ref 8.9–10.3)
Chloride: 111 mmol/L (ref 98–111)
Creatinine, Ser: 1.52 mg/dL — ABNORMAL HIGH (ref 0.61–1.24)
GFR, Estimated: 51 mL/min — ABNORMAL LOW (ref >60)
Glucose, Bld: 121 mg/dL — ABNORMAL HIGH (ref 70–99)
Potassium: 4 mmol/L (ref 3.5–5.1)
Sodium: 144 mmol/L (ref 135–145)
Total Bilirubin: 0.6 mg/dL (ref 0.3–1.2)
Total Protein: 4 g/dL — ABNORMAL LOW (ref 6.5–8.1)

## 2021-07-02 LAB — PROTIME-INR
INR: 1.3 — ABNORMAL HIGH (ref 0.8–1.2)
Prothrombin Time: 16.5 seconds — ABNORMAL HIGH (ref 11.4–15.2)

## 2021-07-02 LAB — PREPARE RBC (CROSSMATCH)

## 2021-07-02 LAB — HEMOGLOBIN AND HEMATOCRIT, BLOOD
HCT: 32.4 % — ABNORMAL LOW (ref 39.0–52.0)
Hemoglobin: 10.1 g/dL — ABNORMAL LOW (ref 13.0–17.0)

## 2021-07-02 MED ORDER — ONDANSETRON HCL 4 MG/2ML IJ SOLN: 4.0000 mg | Freq: Four times a day (QID) | INTRAMUSCULAR | Status: DC | PRN

## 2021-07-02 MED ORDER — GLYCOPYRROLATE 0.2 MG/ML IJ SOLN: 0.2000 mg | INTRAMUSCULAR | Status: DC | PRN

## 2021-07-02 MED ORDER — ONDANSETRON HCL 4 MG/2ML IJ SOLN
4.0000 mg | Freq: Once | INTRAMUSCULAR | Status: AC
  Administered 2021-07-02: 4 mg via INTRAVENOUS
  Filled 2021-07-02: qty 2

## 2021-07-02 MED ORDER — ALTALUBE 85-15 % OP OINT: 1.0000 "application " | TOPICAL_OINTMENT | Freq: Every day | OPHTHALMIC | Status: DC

## 2021-07-02 MED ORDER — NETARSUDIL DIMESYLATE 0.02 % OP SOLN: 1.0000 [drp] | Freq: Every day | OPHTHALMIC | Status: DC

## 2021-07-02 MED ORDER — POLYETHYL GLYCOL-PROPYL GLYCOL 0.4-0.3 % OP GEL: 1.0000 "application " | Freq: Three times a day (TID) | OPHTHALMIC | Status: DC

## 2021-07-02 MED ORDER — TRIMETHOBENZAMIDE HCL 100 MG/ML IM SOLN
200.0000 mg | Freq: Four times a day (QID) | INTRAMUSCULAR | Status: DC | PRN
  Filled 2021-07-02: qty 2

## 2021-07-02 MED ORDER — SODIUM CHLORIDE 0.9 % IV SOLN
750.0000 mg | Freq: Two times a day (BID) | INTRAVENOUS | Status: DC
  Administered 2021-07-02: 750 mg via INTRAVENOUS
  Filled 2021-07-02 (×4): qty 7.5

## 2021-07-02 MED ORDER — ACETAMINOPHEN 650 MG RE SUPP: 650.0000 mg | Freq: Four times a day (QID) | RECTAL | Status: DC | PRN

## 2021-07-02 MED ORDER — HALOPERIDOL LACTATE 5 MG/ML IJ SOLN: 0.5000 mg | INTRAMUSCULAR | Status: DC | PRN

## 2021-07-02 MED ORDER — ONDANSETRON 4 MG PO TBDP: 4.0000 mg | ORAL_TABLET | Freq: Four times a day (QID) | ORAL | Status: DC | PRN

## 2021-07-02 MED ORDER — SODIUM CHLORIDE 0.9% FLUSH: 3.0000 mL | Freq: Two times a day (BID) | INTRAVENOUS | Status: DC

## 2021-07-02 MED ORDER — LORAZEPAM 2 MG/ML IJ SOLN: 1.0000 mg | INTRAMUSCULAR | Status: DC | PRN

## 2021-07-02 MED ORDER — PANTOPRAZOLE SODIUM 40 MG IV SOLR: 40.0000 mg | Freq: Two times a day (BID) | INTRAVENOUS | Status: DC

## 2021-07-02 MED ORDER — LORAZEPAM 2 MG/ML PO CONC: 1.0000 mg | ORAL | Status: DC | PRN

## 2021-07-02 MED ORDER — IOHEXOL 300 MG/ML  SOLN
100.0000 mL | Freq: Once | INTRAMUSCULAR | Status: AC | PRN
  Administered 2021-07-02: 100 mL via INTRAVENOUS

## 2021-07-02 MED ORDER — FENTANYL CITRATE PF 50 MCG/ML IJ SOSY: 50.0000 ug | PREFILLED_SYRINGE | Freq: Once | INTRAMUSCULAR | Status: DC

## 2021-07-02 MED ORDER — POLYVINYL ALCOHOL 1.4 % OP SOLN: 1.0000 [drp] | Freq: Four times a day (QID) | OPHTHALMIC | Status: DC | PRN

## 2021-07-02 MED ORDER — SODIUM CHLORIDE 0.9 % IV BOLUS
1000.0000 mL | Freq: Once | INTRAVENOUS | Status: AC
  Administered 2021-07-02: 1000 mL via INTRAVENOUS

## 2021-07-02 MED ORDER — HALOPERIDOL LACTATE 2 MG/ML PO CONC
0.5000 mg | ORAL | Status: DC | PRN
  Filled 2021-07-02: qty 0.3

## 2021-07-02 MED ORDER — ALBUTEROL SULFATE (2.5 MG/3ML) 0.083% IN NEBU: 2.5000 mg | INHALATION_SOLUTION | RESPIRATORY_TRACT | Status: DC | PRN

## 2021-07-02 MED ORDER — GLYCOPYRROLATE 1 MG PO TABS
1.0000 mg | ORAL_TABLET | ORAL | Status: DC | PRN
  Filled 2021-07-02: qty 1

## 2021-07-02 MED ORDER — MORPHINE 100MG IN NS 100ML (1MG/ML) PREMIX INFUSION
1.0000 mg/h | INTRAVENOUS | Status: DC
  Administered 2021-07-02: 1 mg/h via INTRAVENOUS
  Filled 2021-07-02: qty 100

## 2021-07-02 MED ORDER — VALPROATE SODIUM 100 MG/ML IV SOLN
125.0000 mg | Freq: Two times a day (BID) | INTRAVENOUS | Status: DC
  Filled 2021-07-02 (×3): qty 1.25

## 2021-07-02 MED ORDER — SODIUM CHLORIDE 0.9 % IV SOLN: 10.0000 mL/h | Freq: Once | INTRAVENOUS | Status: DC

## 2021-07-02 MED ORDER — ONDANSETRON HCL 4 MG PO TABS
4.0000 mg | ORAL_TABLET | Freq: Four times a day (QID) | ORAL | Status: DC | PRN
Start: 2021-07-02 — End: 2021-07-02

## 2021-07-02 MED ORDER — LORAZEPAM 1 MG PO TABS: 1.0000 mg | ORAL_TABLET | ORAL | Status: DC | PRN

## 2021-07-02 MED ORDER — HALOPERIDOL 0.5 MG PO TABS
0.5000 mg | ORAL_TABLET | ORAL | Status: DC | PRN
  Filled 2021-07-02: qty 1

## 2021-07-02 MED ORDER — PANTOPRAZOLE INFUSION (NEW) - SIMPLE MED
8.0000 mg/h | INTRAVENOUS | Status: DC
  Administered 2021-07-02: 8 mg/h via INTRAVENOUS
  Filled 2021-07-02: qty 100
  Filled 2021-07-02: qty 80

## 2021-07-02 MED ORDER — PANTOPRAZOLE 80MG IVPB - SIMPLE MED
80.0000 mg | Freq: Once | INTRAVENOUS | Status: AC
  Administered 2021-07-02: 80 mg via INTRAVENOUS
  Filled 2021-07-02: qty 80

## 2021-07-02 MED ORDER — ACETAMINOPHEN 325 MG PO TABS: 650.0000 mg | ORAL_TABLET | Freq: Four times a day (QID) | ORAL | Status: DC | PRN

## 2021-07-02 MED ORDER — BIOTENE DRY MOUTH MT LIQD: 15.0000 mL | OROMUCOSAL | Status: DC | PRN

## 2021-07-02 MED ORDER — MORPHINE BOLUS VIA INFUSION
1.0000 mg | INTRAVENOUS | Status: DC | PRN
  Filled 2021-07-02: qty 1

## 2021-07-03 ENCOUNTER — Ambulatory Visit: Payer: Medicaid Other | Admitting: Internal Medicine

## 2021-07-03 LAB — BPAM RBC
Blood Product Expiration Date: 202305302359
Blood Product Expiration Date: 202305312359
ISSUE DATE / TIME: 202304300444
ISSUE DATE / TIME: 202304300539
Unit Type and Rh: 5100
Unit Type and Rh: 5100

## 2021-07-03 LAB — TYPE AND SCREEN
ABO/RH(D): O POS
Antibody Screen: NEGATIVE
Unit division: 0
Unit division: 0

## 2021-07-03 NOTE — ED Triage Notes (Signed)
Patient BIB [facility] from [facility] with reports of hematemesis.  EMS reports CNA told them that this has been going on for days, RN reported this has been going on for approx 30 mins.  EMS reports large amount of blood on scene with blood clots.   ? ?BP 98/p. ?

## 2021-07-03 NOTE — ED Notes (Signed)
ED Provider at bedside. 

## 2021-07-03 NOTE — ED Notes (Addendum)
Patient continues to take off [facility] stating "I just want to die. Stop all of this." RN has reassured and repostioned patient. Explained importance of keep  and monitor wires on. Patient reconnected to the monitor and oxygen ?

## 2021-07-03 NOTE — Progress Notes (Addendum)
CT scan of the abdomen pelvis resulted showing massively dilated stomach filled with food debris and fluid with probable pneumatosis of the posterior wall of the gastric fundus concerning for gastric wall ischemia, small spleen with multiple subcapsular wedge-shaped perfusion deficits concerning for infarction, small right and moderate to large left pleural effusion noted, posterior left lower lobe collapse, moderate volume ascites at the abdomen and pelvis.  The radiologist had noted concern for the possibility of a mass, but was not visible on CT.  Given patient's multiple comorbidities likely hood of survival is limited.  Patient DNR status was confirmed.  Discussed with the patient that at this time aggressive therapies may not change outcome.  Talk to the patient in regards to keeping him comfortable at this time and out of pain.  Patient is agreeable to this.  Family was updated and making attempts to come to hospital currently. ?-Changed admission orders to a palliative bed ?-End-of-life order set initiated ?-Discontinue cardiac monitoring ?-Routine vital sign checks ?-Continue Protonix drip, Keppra, and valproic acid IV ?-N.p.o. ?-Okay for RN to pronounce death ?-Aspiration precautions ?-Maintain IV access ?-Continuous nasal cannula oxygen as needed for comfort ?-Morphine drip for pain ?-Zofran/Tigan IV prn nausea/vomiting ?-Ativan IV prn anxiety or seizure ?-Continue Keppra and valproic acid ?-Haloperidol IV prn agitation or delirium ?-Glycopyrrolate prn excessive secretions ?-Albuterol prn wheezing   ?-May consider consult to palliative care in a.m. ? ?

## 2021-07-03 NOTE — ED Notes (Signed)
RN to blood bank for PRBC ?

## 2021-07-03 NOTE — Discharge Summary (Addendum)
?Physician Discharge/ Death Summary ?  ? ? ?[name]:[id number] DOB: 02-18-1956 DOA: Jul 30, 2021 ? ?PCP: [name], MD ?PCP/Office notified: no ? ?Admit date: 07-30-2021 ?Date of Death:Jul 30, 2021 ? ?Final Diagnoses:  ?Principal Problem: ?  Hematemesis ?Active Problems: ?  Acute blood loss anemia ?  Hypotension ?  PAD s/p bilateral BKA ?  Seizure disorder ([name]) ?  Pleural effusion due to CHF (congestive heart failure) ([name]) ?  HFrEF (heart failure with reduced ejection fraction) ([name]) ?  Diabetes mellitus type 2 in obese [name]) ? ? ? ? ? ?History of present illness:  ?[name] is a 66 y.o. male with medical history significant of hypertension, hyperlipidemia, and diabetes mellitus type 2, combined systolic and diastolic heart failure EF < 20%, seizure disorder, PAD s/p bilateral knee amputation who presented with complaints of vomiting starting this morning.  He did not know he was vomiting blood at the time as he cannot see.  He complains of dull pain in above his navel. ? ?Upon admission into the emergency department patient does not be afebrile with respirations 13-23, blood pressure 62/53 with improvement to 119/73, and O2 saturations as low as 88% with O2 saturation currently maintained on 3 to 4 L of oxygen.  Labs significant for hemoglobin 9.4(previously 14.5 on 4/2), BUN 24, creatinine 1.52, and albumin 1.6.  Patient had been typed and screened and ordered to be transfused 2 units of packed red blood cells.  Dr. Fuller Plan of gastroenterology was consulted.  Patient was given Zofran, Protonix, fentanyl, and 1 L normal saline IV fluids. ? ?Tucker Course:  ?Patient had received 2 units of packed red blood cells and continued on Protonix drip.  Blood pressure still remains somewhat soft.  CT scan revealed concern for probable pneumatosis of the posterior wall of the gastric fundus. Gas noted in perigastric venous anatomy adjacent to the fundus. Trace portal venous gas noted in the liver as well. Given  the pneumatosis and presence of venous gas gastric wall ischemia was thought to be most likely.  Radiology noted possible concern for gastric mass  that not clearly visible on CT.  Due to patient's significantly reduced heart function and other comorbidities the likelihood of survival from this was thought to be poor.  Patient updated in regards to these findings and at this time he would like to be kept comfortable and out of pain.  Discontinued all aggressive measures and placed orders for morphine drip.  Continue Protonix drip, Keppra, and valproic acid.  Attempt had been made to place NG tube unsuccessfully.  Patient passed away at 10:38 AM ? ?Signed: ? ?[name]  ?[facility] ?07/05/2021, 9:25 PM ? ? ? ? ? ? ?The results of significant diagnostics from this hospitalization (including imaging, microbiology, ancillary and laboratory) are listed below for reference.  ? ?Imaging Studies: ?DG Chest 1 View ? ?Result Date: 06/03/2021 ?CLINICAL DATA:  Status post left thoracentesis. EXAM: CHEST  1 VIEW COMPARISON:  Film earlier today at 0710 hours FINDINGS: The heart is enlarged. Significant diminishment in left pleural fluid after thoracentesis with some residual fluid remaining. Underlying atelectasis/consolidation of the left lung. Cannot exclude underlying mass. No pneumothorax. IMPRESSION: Decrease in left pleural fluid after thoracentesis with no pneumothorax. Underlying atelectasis/consolidation of the left lung. Cannot exclude underlying mass. Electronically Signed   By: Aletta Edouard M.D.   On: 06/03/2021 12:38  ? ?CT Abdomen Pelvis W Contrast ? ?Result Date: 2021-07-30 ?CLINICAL DATA:  Abdominal pain.  Abdominal distension.  Hematemesis. EXAM: CT ABDOMEN AND PELVIS WITH CONTRAST TECHNIQUE: Multidetector CT imaging of the abdomen and pelvis was performed using the standard protocol following bolus administration of intravenous contrast. RADIATION DOSE REDUCTION: This exam was performed according to  the departmental dose-optimization program which includes automated exposure control, adjustment of the mA and/or kV according to patient size and/or use of iterative reconstruction technique. CONTRAST:  176m OMNIPAQUE IOHEXOL 300 MG/ML  SOLN COMPARISON:  None. FINDINGS: Lower chest: Small right with moderate to large left pleural effusions. Posterior left upper lobe and left lower lobe collapse evident. Trace gas in the right heart is consistent with IV placement. Hepatobiliary: Heterogeneous liver parenchyma may be related to fatty deposition or differential perfusion. There is trace portal venous gas identified in the left hepatic lobe (images 25 and 31 of series 3 There is no evidence for gallstones, gallbladder wall thickening, or pericholecystic fluid. No intrahepatic or extrahepatic biliary dilation. Pancreas: No focal mass lesion. No dilatation of the main duct. No intraparenchymal cyst. No peripancreatic edema. Spleen: Small spleen with multiple subcapsular wedge-shaped perfusion defects compatible with infarcts. Adrenals/Urinary Tract: No adrenal nodule or mass. Cortical scarring in both kidneys with somewhat heterogeneous perfusion bilaterally. No hydronephrosis No evidence for hydroureter. The urinary bladder appears normal for the degree of distention. Stomach/Bowel: Stomach is massively dilated, filled with food debris and fluid. Probable pneumatosis posterior wall the gastric fundus (image 35/3). Coronal imaging suggests that some of this extraluminal gas may be vascular/venous (see image 113 of coronal series 6. Stomach remains dilated to the level of the pylorus with some fluid and debris seen in the duodenal bulb. The descending and transverse segments of the duodenum are nondilated. No obstructing mass lesion is discernible by CT. No small bowel wall thickening. No small bowel dilatation. The terminal ileum is normal. The appendix is not well visualized, but there is no edema or inflammation in  the region of the cecum. No gross colonic mass. No colonic wall thickening. Vascular/Lymphatic: There is mild atherosclerotic calcification of the abdominal aorta without aneurysm. Portal vein and SMV are diminutive but appear patent. Patency of the splenic vein cannot be confirmed. There is no gastrohepatic or hepatoduodenal ligament lymphadenopathy. No retroperitoneal or mesenteric lymphadenopathy. No pelvic sidewall lymphadenopathy. Reproductive: The prostate gland and seminal vesicles are unremarkable. Other: Moderate volume ascites identified in the abdomen and pelvis. Musculoskeletal: No worrisome lytic or sclerotic osseous abnormality. Diffuse body wall edema/anasarca evident. IMPRESSION: 1. Massively dilated stomach filled with food debris and fluid. Probable pneumatosis posterior wall of the gastric fundus. Gas noted in perigastric venous anatomy adjacent to the fundus. Trace portal venous gas noted in the liver as well. Given the pneumatosis and presence of portal venous gas, gastric wall ischemia is a distinct concern. 2. Small spleen with multiple subcapsular wedge-shaped perfusion defects compatible with infarcts. Patency of the splenic vein cannot be confirmed on this study. 3. Small right with moderate to large left pleural effusions. 4. Posterior left upper lobe and left lower lobe collapse. 5. Moderate volume ascites in the abdomen and pelvis. 6. Diffuse body wall edema/anasarca. 7. Heterogeneous liver parenchyma may be related to fatty deposition or differential perfusion. 8. Cortical scarring in both kidneys with somewhat heterogeneous perfusion bilaterally. Nonspecific but correlation with urinalysis recommended. 9. Aortic Atherosclerosis (ICD10-I70.0). Findings were discussed with Dr. HGilford Raidat the time of study interpretation (0801 hours on 005/14/23. Electronically Signed   By: EMisty StanleyM.D.   On: 0May 14, 202308:04  ? ?DG Chest Port 1 View ? ?  Result Date: 06/03/2021 ?CLINICAL DATA:   66 year old male with chest pain. EXAM: PORTABLE CHEST 1 VIEW COMPARISON:  Chest radiographs 03/21/2021 and earlier. FINDINGS: Portable AP semi upright view at 0711 hours. Evidence of small pleural effusi

## 2021-07-03 NOTE — ED Provider Notes (Signed)
? ?[facility]  ?Provider Note ? ?CSN: [id number] ?Arrival date & time: 07/03/21 4431 ? ?History ?Chief Complaint  ?Patient presents with  ?? Hematemesis  ? ? ?[name] is a 66 y.o. male with a long history of numerous chronic medical problems brought to the ED via EMS from SNF for evaluation of hematemesis. Patient reports he vomited several times tonight, he is blind so did not know there was blood but per EMS it was bright red. He has had some abdominal pains recently. Denies any fevers. He has a history of DM, HF, CKD, PVD but no documented history of PUD or cirrhosis. He had one prior attempt at paracentesis documented in 2018 but not enough fluid to drain then. No recent abdominal imaging studies. He is not on a blood thinner.  ? ? ?Home Medications ?Prior to Admission medications   ?Medication Sig Start Date End Date Taking? Authorizing Provider  ?aspirin 81 MG EC tablet Take 81 mg by mouth daily.   Yes [provider]  ?atorvastatin (LIPITOR) 40 MG tablet Take 40 mg by mouth at bedtime.  08/25/15  Yes [provider]  ?bisoprolol (ZEBETA) 10 MG tablet Take 1 tablet (10 mg total) by mouth daily. 03/17/21  Yes Chandrasekhar, Mahesh A, MD  ?empagliflozin (JARDIANCE) 10 MG TABS tablet Take 10 mg by mouth daily.   Yes [provider]  ?furosemide (LASIX) 40 MG tablet Take 1 tablet (40 mg total) by mouth daily. 03/17/21 07/03/2021 Yes Chandrasekhar, Mahesh A, MD  ?isosorbide mononitrate (IMDUR) 30 MG 24 hr tablet Take 1 tablet (30 mg total) by mouth daily. 12/21/20  Yes Alcus Dad, MD  ?sitaGLIPtin (JANUVIA) 100 MG tablet Take 100 mg by mouth daily.   Yes [provider]  ?spironolactone (ALDACTONE) 25 MG tablet Take 0.5 tablets (12.5 mg total) by mouth daily. 01/18/21  Yes [place], Maricela Bo, FNP  ?White Petrolatum-Mineral Oil (ALTALUBE) 85-15 % OINT Place 1 application into both eyes at bedtime. 01/23/18  Yes [provider]   ?acetaminophen (TYLENOL) 325 MG tablet Take 650 mg by mouth every 6 (six) hours as needed for mild pain (AND CANNOT EXCEED 3,000 MG/24 HOURS OF TYLENOL FROM ALL COMBINED SOURCES).    [provider]  ?bisacodyl (DULCOLAX) 5 MG EC tablet Take 10 mg by mouth daily as needed for moderate constipation.    [provider]  ?divalproex (DEPAKOTE) 125 MG DR tablet Take 125 mg by mouth in the morning and at bedtime.    [provider]  ?GLUCAGEN HYPOKIT 1 MG SOLR injection Inject 1 mg into the muscle once as needed for low blood sugar.    [provider]  ?levETIRAcetam (KEPPRA) 750 MG tablet Take 1 tablet (750 mg total) by mouth 2 (two) times daily. 10/20/16   Raiford Noble Latif, DO  ?ondansetron (ZOFRAN) 4 MG tablet Take 4 mg by mouth every 6 (six) hours as needed for nausea or vomiting.    [provider]  ?Polyethyl Glycol-Propyl Glycol (SYSTANE) 0.4-0.3 % GEL ophthalmic gel Place 1 application into both eyes every 8 (eight) hours.    [provider]  ?polyethylene glycol (MIRALAX / GLYCOLAX) packet Take 17 g by mouth 2 (two) times daily. 02/07/17   Caroline More, DO  ?RHOPRESSA 0.02 % SOLN Place 1 drop into the left eye at bedtime.    [provider]  ?risperiDONE (RISPERDAL) 1 MG/ML oral solution Take 2 mg by mouth in the morning.  [provider]  ?sacubitril-valsartan (ENTRESTO) 24-26 MG Take 1 tablet by mouth 2 (two) times daily. 12/27/20   Lyda Jester M, PA-C  ?senna-docusate (SENOKOT-S) 8.6-50 MG tablet Take 1 tablet by mouth at bedtime.    [provider]  ?sertraline (ZOLOFT) 50 MG tablet Take 50 mg by mouth in the morning.    [provider]  ?tamsulosin (FLOMAX) 0.4 MG CAPS capsule Take 2 capsules (0.8 mg total) by mouth daily after supper. ?Patient taking differently: Take 0.4 mg by mouth daily after supper. 12/31/16   Caroline More, DO  ? ? ? ?Allergies    ?Patient has no known allergies. ? ? ?Review of  Systems   ?Review of Systems ?Please see HPI for pertinent positives and negatives ? ?Physical Exam ?BP (!) 79/62   Pulse 92   Temp 98.4 ?F (36.9 ?C) (Oral)   Resp 18   SpO2 100%  ? ?Physical Exam ?Vitals and nursing note reviewed.  ?Constitutional:   ?   Appearance: Normal appearance.  ?HENT:  ?   Head: Normocephalic and atraumatic.  ?   Nose: Nose normal.  ?   Mouth/Throat:  ?   Mouth: Mucous membranes are moist.  ?Eyes:  ?   Comments: Blind  ?Cardiovascular:  ?   Rate and Rhythm: Normal rate.  ?Pulmonary:  ?   Effort: Pulmonary effort is normal.  ?   Breath sounds: Normal breath sounds.  ?Abdominal:  ?   General: Abdomen is flat. There is distension.  ?   Palpations: Abdomen is soft. There is mass (? hepatomegaly).  ?   Tenderness: There is abdominal tenderness (diffuse).  ?   Comments: Dried blood around his L neck/shoulder from emesis  ?Musculoskeletal:     ?   General: No swelling. Normal range of motion.  ?   Cervical back: Neck supple.  ?   Comments: S/p bilateral BKA  ?Skin: ?   General: Skin is warm and dry.  ?Neurological:  ?   General: No focal deficit present.  ?   Mental Status: He is alert.  ?Psychiatric:     ?   Mood and Affect: Mood normal.  ? ? ?ED Results / Procedures / Treatments   ?EKG ?EKG Interpretation ? ?Date/Time:  07/12/2021 03:49:18 EDT ?Ventricular Rate:  91 ?PR Interval:  158 ?QRS Duration: 127 ?QT Interval:  408 ?QTC Calculation: 502 ?R Axis:   -42 ?Text Interpretation: Sinus rhythm Ventricular premature complex Nonspecific IVCD with LAD Abnormal T, consider ischemia, lateral leads No significant change since last tracing Confirmed by Calvert Cantor [phone]) on Jul 12, 2021 4:11:22 AM ? ?Procedures ?Marland KitchenCritical Care ?Performed by: Truddie Hidden, MD ?Authorized by: Truddie Hidden, MD  ? ?Critical care provider statement:  ?  Critical care time (minutes):  80 ?  Critical care time was exclusive of:  Separately billable procedures and treating other patients ?  Critical  care was necessary to treat or prevent imminent or life-threatening deterioration of the following conditions:  Shock ?  Critical care was time spent personally by me on the following activities:  Development of treatment plan with patient or surrogate, discussions with consultants, evaluation of patient's response to treatment, examination of patient, ordering and review of laboratory studies, ordering and review of radiographic studies, ordering and performing treatments and interventions, pulse oximetry, re-evaluation of patient's condition and review of old charts ?  Care discussed with: admitting provider   ? ?Medications Ordered in the ED ?Medications  ?pantoprozole (PROTONIX)  80 mg /NS 100 mL infusion (8 mg/hr Intravenous New Bag/Given 07/11/21 0424)  ?pantoprazole (PROTONIX) injection 40 mg (has no administration in time range)  ?0.9 %  sodium chloride infusion (has no administration in time range)  ?fentaNYL (SUBLIMAZE) injection 50 mcg (has no administration in time range)  ?ondansetron [facility]) injection 4 mg (4 mg Intravenous Given 07-11-21 0346)  ?pantoprazole (PROTONIX) 80 mg /NS 100 mL IVPB (0 mg Intravenous Stopped 11-Jul-2021 0504)  ?sodium chloride 0.9 % bolus 1,000 mL (0 mLs Intravenous Stopped 2021-07-11 0541)  ?ondansetron [facility]) injection 4 mg (4 mg Intravenous Given 07/11/2021 0558)  ? ? ?Initial Impression and Plan ? Patient here with reported bright red hematemesis. Not currently vomiting but feels nauseated. Abdomen is distended and tender, will check labs, send for CT. Zofran for nausea.  ? ?ED Course  ? ?Clinical Course as of 07/11/21 0710  ?Sun 11-Jul-2021  ?3491 Per RN, patient had another episode of hematemesis prior to getting zofran. Will begin Protonix bolus and drip. Patient confirms he is DNR although that paperwork did not come from SNF.  [CS]  ?0406 CBC with significant drop in hemoglobin from baseline.  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 Otherwise LFTs are unremarkable. INR is mildly  elevated at 1.3.  [CS]  ?0427 Patient continues to have occasional hematemesis. BP has trended down. Will begin IVF bolus, PRBC transfusion and discuss with GI.  [CS]  ?[place] with Dr. Fuller Plan, GI who w

## 2021-07-03 NOTE — Consult Note (Addendum)
? ?                                            Consultation Note ? ? ?Referring Provider: [facility] ?PCP: [name], MD ?Primary Gastroenterologist: Althia Forts ?Reason for consultation: Hematemesis  ?Hospital Day: 1 ? ?ASSESSMENT:  ? ?Gastric outlet obstruction /  hematemesis. Massively distended stomach with food and fluid on CT scan. Probable pneumatosis posterior wall of the gastric fundus. Gas noted in perigastric venous anatomy adjacent to the fundus. Trace portal venous gas noted in the liver as well. Given the pneumatosis and presence of portal venous gas, gastric wall ischemia is a distinct concern.  ?BP still low but improved to 89/75 after IVF. He is critically ill. He is a DNR ? ?Acute blood loss anemia. Hgb down to 9.1 from baseline of 14.  ?Received 2 u PRBCs this am. Hgb improved to 10.1 ? ?Ascites  / anasarca  ?Likely related to heart failure and also hypoalbuminemia. No evidence for cirrhosis on imaging ? ?Hypoxia. At risk for aspiration.  ? ?Combined systolic and diastolic heart failure ?Last EF 20 to 25% with grade 2 diastolic dysfunction.  ? ?Prolonged QT interval  ? ?See PMH for additional medical problems ? ? ?PLAN:  ?Abdomen markedly distended and tympanitic on exam. EDP has ordered NGT decompression ?PPI infusion ?He received 2u PRBCs this am. Follow up hgb 10.1.  ?Plan for EGD tomorrow if adequately decompressed and medically stable.  ? ?Addendum: Just notified by [facility]. Transitioning patient to comfort care ? ? ?Attending Physician Note  ? ?I have taken a history, reviewed the chart and examined the patient. I performed a substantive portion of this encounter, including complete performance of at least one of the key components, in conjunction with the APP. I agree with the APP's note, impression and recommendations with my edits. My additional impressions and recommendations are as follows.  ? ?Markedly dilated stomach, probably pneumatosis in the posterior gastric wall, trace  portal venous gas, hematemesis, ABL anemia, probably splenic infarcts, moderate ascites, hypotension. Suspected gastric ischemia, infarction with bleeding.  ? ?NG decompression, IV PPI, IV antibiotics and consider EGD if aggressive measures are planned. Given patients multiple comorbidities, DNR status and his severe acute illness the decision has been made for comfort care. GI is available if needed.  ? ?Lucio Edward, MD [facility] ?See [facility], [facility], for our on call provider  ? ? ? ? ?History of Present Illness:  ?[name] is a 66 y.o. male with a past medical history significant for glaucoma, PAD status post bilateral BKA, hypertension, CKD 3, diabetes, combined systolic heart failure with EF of 20 to 25%, seizures, blindness. See PMH for any additional medical problems. ? ? ?Patient presented to ED from SNF for evaluation of hematemesis. .  There is bright red blood in the wall canister . his hgb has declined from 14.5 earlier this month to 10.1.  He has been hypotensive.    Not sure how accurate details are from the patient but he says the vomiting started today.  Additionally he endorses mid upper abdominal pain which she says also started today.  He denies blood in stool.  Spoke with nurse, patient has not had any bowel movements in the ED but he has continued to have episodes of hematemesis. ? ?Previous GI Evaluation / History   ? ?Recent Labs and  Imaging ?DG Chest 1 View ? ?Result Date: 06/03/2021 ?CLINICAL DATA:  Status post left thoracentesis. EXAM: CHEST  1 VIEW COMPARISON:  Film earlier today at 0710 hours FINDINGS: The heart is enlarged. Significant diminishment in left pleural fluid after thoracentesis with some residual fluid remaining. Underlying atelectasis/consolidation of the left lung. Cannot exclude underlying mass. No pneumothorax. IMPRESSION: Decrease in left pleural fluid after thoracentesis with no pneumothorax. Underlying atelectasis/consolidation of the left lung. Cannot exclude  underlying mass. Electronically Signed   By: Aletta Edouard M.D.   On: 06/03/2021 12:38  ? ?CT Abdomen Pelvis W Contrast ? ?Result Date: 08-01-21 ?CLINICAL DATA:  Abdominal pain.  Abdominal distension.  Hematemesis. EXAM: CT ABDOMEN AND PELVIS WITH CONTRAST TECHNIQUE: Multidetector CT imaging of the abdomen and pelvis was performed using the standard protocol following bolus administration of intravenous contrast. RADIATION DOSE REDUCTION: This exam was performed according to the departmental dose-optimization program which includes automated exposure control, adjustment of the mA and/or kV according to patient size and/or use of iterative reconstruction technique. CONTRAST:  158m OMNIPAQUE IOHEXOL 300 MG/ML  SOLN COMPARISON:  None. FINDINGS: Lower chest: Small right with moderate to large left pleural effusions. Posterior left upper lobe and left lower lobe collapse evident. Trace gas in the right heart is consistent with IV placement. Hepatobiliary: Heterogeneous liver parenchyma may be related to fatty deposition or differential perfusion. There is trace portal venous gas identified in the left hepatic lobe (images 25 and 31 of series 3 There is no evidence for gallstones, gallbladder wall thickening, or pericholecystic fluid. No intrahepatic or extrahepatic biliary dilation. Pancreas: No focal mass lesion. No dilatation of the main duct. No intraparenchymal cyst. No peripancreatic edema. Spleen: Small spleen with multiple subcapsular wedge-shaped perfusion defects compatible with infarcts. Adrenals/Urinary Tract: No adrenal nodule or mass. Cortical scarring in both kidneys with somewhat heterogeneous perfusion bilaterally. No hydronephrosis No evidence for hydroureter. The urinary bladder appears normal for the degree of distention. Stomach/Bowel: Stomach is massively dilated, filled with food debris and fluid. Probable pneumatosis posterior wall the gastric fundus (image 35/3). Coronal imaging suggests that  some of this extraluminal gas may be vascular/venous (see image 113 of coronal series 6. Stomach remains dilated to the level of the pylorus with some fluid and debris seen in the duodenal bulb. The descending and transverse segments of the duodenum are nondilated. No obstructing mass lesion is discernible by CT. No small bowel wall thickening. No small bowel dilatation. The terminal ileum is normal. The appendix is not well visualized, but there is no edema or inflammation in the region of the cecum. No gross colonic mass. No colonic wall thickening. Vascular/Lymphatic: There is mild atherosclerotic calcification of the abdominal aorta without aneurysm. Portal vein and SMV are diminutive but appear patent. Patency of the splenic vein cannot be confirmed. There is no gastrohepatic or hepatoduodenal ligament lymphadenopathy. No retroperitoneal or mesenteric lymphadenopathy. No pelvic sidewall lymphadenopathy. Reproductive: The prostate gland and seminal vesicles are unremarkable. Other: Moderate volume ascites identified in the abdomen and pelvis. Musculoskeletal: No worrisome lytic or sclerotic osseous abnormality. Diffuse body wall edema/anasarca evident. IMPRESSION: 1. Massively dilated stomach filled with food debris and fluid. Probable pneumatosis posterior wall of the gastric fundus. Gas noted in perigastric venous anatomy adjacent to the fundus. Trace portal venous gas noted in the liver as well. Given the pneumatosis and presence of portal venous gas, gastric wall ischemia is a distinct concern. 2. Small spleen with multiple subcapsular wedge-shaped perfusion defects compatible with infarcts. Patency of  the splenic vein cannot be confirmed on this study. 3. Small right with moderate to large left pleural effusions. 4. Posterior left upper lobe and left lower lobe collapse. 5. Moderate volume ascites in the abdomen and pelvis. 6. Diffuse body wall edema/anasarca. 7. Heterogeneous liver parenchyma may be  related to fatty deposition or differential perfusion. 8. Cortical scarring in both kidneys with somewhat heterogeneous perfusion bilaterally. Nonspecific but correlation with urinalysis recommended. 9. Aortic Athe

## 2021-07-03 NOTE — ED Notes (Signed)
Family at bedside to view patient ? ?

## 2021-07-03 NOTE — Progress Notes (Signed)
Brief Palliative Medicine Progress Note: ? ?PMT consult received and chart reviewed.  ? ?1:15 PM ?Received report from primary RN - she tells me patient has passed away and family has been notified. ? ?No further PMT needs at this time. ? ?Thank you for allowing PMT to assist in the care of this patient. ? ?[employer] Tamala Julian, FNP-BC ?Palliative Medicine Team ?Team Phone: [phone] ?NO CHARGE ? ?

## 2021-07-03 NOTE — H&P (Signed)
?History and Physical  ? ? ?Patient: [name] ESP:[id number] DOB: [birth year] ?DOA: 04-Jul-2021 ?DOS: the patient was seen and examined on Jul 04, 2021 ?PCP: [name], MD  ?Patient coming from: [facility] via EMS ? ?Chief Complaint:  ?Chief Complaint  ?Patient presents with  ? Hematemesis  ? ?HPI: [name] is a 66 y.o. 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  He did not know he was vomiting blood at the time as he cannot see.  He complains of dull pain in above his navel. ? ? ?Upon admission into the emergency department patient does not be afebrile with respirations 13-23, blood pressure 62/53 with improvement to 119/73, and O2 saturations as low as 88% with O2 saturation currently maintained on 3 to 4 L of oxygen.  Labs significant for hemoglobin 9.4(previously 14.5 on 4/2, BUN 24, creatinine 1.52, and albumin 1.6.  Patient had been typed and screened and ordered to be transfused 2 units of packed red blood cells.  Dr. Fuller Plan of gastroenterology was consulted.  Patient was given Zofran, Protonix, fentanyl, and 1 L normal saline IV fluids. ? ?Review of Systems: As mentioned in the history of present illness. All other systems reviewed and are negative. ?Past Medical History:  ?Diagnosis Date  ? Acute on chronic systolic heart failure, NYHA class 3 ([place])   ? AKI (acute kidney injury) ([place])   ? Anemia   ? BKA stump complication ([place])   ? Blind left eye   ? Blind left eye   ? Cardiomyopathy, dilated ([place])   ? CHF (congestive heart failure) ([place])   ? Combined systolic and diastolic congestive heart failure ([place])   ? Congestive dilated cardiomyopathy ([place]) 07/11/2015  ? Diabetes mellitus   ? Diabetic neuropathy associated with type 2 diabetes mellitus ([place])   ? 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 Left body jerking  ? Hypertension   ? Neuropathy   ? Noncompliance with medications   ? Open knee wound 10/2015  ? on rt bka  ? PAD (peripheral artery disease) ([place])   ? Protein calorie malnutrition ([place])   ? Shortness of breath dyspnea   ? ?Past Surgical History:  ?Procedure Laterality Date  ? ABDOMINAL AORTOGRAM W/LOWER EXTREMITY N/A 03/25/2017  ? Procedure: ABDOMINAL AORTOGRAM W/LOWER EXTREMITY;  Surgeon: Angelia Mould, MD;  Location: [facility];  Service: Cardiovascular;  Laterality: N/A;  ? AMPUTATION Right 09/26/2013  ? Procedure: AMPUTATION BELOW KNEE;  Surgeon: Rozanna Box, MD;  Location: [place];  Service: Orthopedics;  Laterality: Right;  ? AMPUTATION Right 05/01/2015  ? Procedure: REVISION OF RIGHT TRANSTIBIAL AMPUTATION ;  Surgeon: Newt Minion, MD;  Location: [place];  Service: Orthopedics;  Laterality: Right;  ? AMPUTATION Left 06/24/2020  ? Procedure: LEFT BELOW KNEE AMPUTATION;  Surgeon: Newt Minion, MD;  Location: [place];  Service: Orthopedics;  Laterality: Left;  ? APPLICATION OF WOUND VAC  08/10/2016  ? Procedure: APPLICATION OFI [place];  Surgeon: Newt Minion, MD;  Location: [facility];  Service: Orthopedics;;  ? CARDIAC CATHETERIZATION N/A 07/13/2015  ? Procedure: Right/Left Heart Cath and Coronary Angiography;  Surgeon: Jolaine Artist, MD;  Location: [facility];  Service: Cardiovascular;  Laterality: N/A;  ? COLONOSCOPY    ? I & D EXTREMITY Right  09/24/2013  ? Procedure: IRRIGATION AND DEBRIDEMENT RIGHT FOOT ULCER;  Surgeon: Renette Butters, MD;  Location: [place];  Service: Orthopedics;  Laterality: Right;  ? I & D EXTREMITY Right 09/26/2013  ? Procedure: Repeat IRRIGATION AND DEBRIDEMENT Right Foot Ulcer;  Surgeon: Rozanna Box, MD;  Location: [place];  Service: Orthopedics;  Laterality: Right;  ? MULTIPLE TOOTH EXTRACTIONS    ? SKIN SPLIT GRAFT Right 08/10/2016  ? Procedure: SKIN GRAFT SPLIT THICKNESS RIGHT LEG;  Surgeon: Newt Minion, MD;   Location: [place];  Service: Orthopedics;  Laterality: Right;  ? STUMP REVISION Right 04/20/2015  ? Procedure: Revision Right Below Knee Amputation, Apply Wound VAC;  Surgeon: Newt Minion, MD;  Location: [place];  Service: Orthopedics;  Laterality: Right;  ? TONSILLECTOMY    ? ?Social History:  reports that he quit smoking about 21 years ago. His smoking use included cigarettes. He quit smokeless tobacco use about 21 years ago. He reports that he does not drink alcohol and does not use drugs. ? ?No Known Allergies ? ?Family History  ?Problem Relation Age of Onset  ? Cancer Mother   ? Peripheral vascular disease Father   ? ? ?Prior to Admission medications   ?Medication Sig Start Date End Date Taking? Authorizing Provider  ?acetaminophen (TYLENOL) 325 MG tablet Take 650 mg by mouth every 6 (six) hours as needed for mild pain.   Yes [provider]  ?aspirin 81 MG EC tablet Take 81 mg by mouth daily.   Yes [provider]  ?atorvastatin (LIPITOR) 40 MG tablet Take 40 mg by mouth at bedtime.  08/25/15  Yes [provider]  ?bisacodyl (DULCOLAX) 5 MG EC tablet Take 10 mg by mouth daily as needed for moderate constipation.   Yes [provider]  ?bisoprolol (ZEBETA) 10 MG tablet Take 1 tablet (10 mg total) by mouth daily. 03/17/21  Yes Chandrasekhar, Mahesh A, MD  ?divalproex (DEPAKOTE) 125 MG DR tablet Take 125 mg by mouth in the morning and at bedtime.   Yes [provider]  ?empagliflozin (JARDIANCE) 10 MG TABS tablet Take 10 mg by mouth daily.   Yes [provider]  ?furosemide (LASIX) 40 MG tablet Take 1 tablet (40 mg total) by mouth daily. 03/17/21 07-21-2021 Yes Chandrasekhar, Mahesh A, MD  ?GLUCAGEN HYPOKIT 1 MG SOLR injection Inject 1 mg into the muscle once as needed for low blood sugar.   Yes [provider]  ?isosorbide mononitrate (IMDUR) 30 MG 24 hr tablet Take 1 tablet (30 mg total) by mouth daily. 12/21/20  Yes Alcus Dad, MD  ?levETIRAcetam  (KEPPRA) 750 MG tablet Take 1 tablet (750 mg total) by mouth 2 (two) times daily. 10/20/16  Yes Sheikh, Omair Latif, DO  ?mirtazapine (REMERON) 7.5 MG tablet Take 7.5 mg by mouth at bedtime.   Yes [provider]  ?NON FORMULARY Magic cup- with lunch and dinner   Yes [provider]  ?ondansetron (ZOFRAN) 4 MG tablet Take 4 mg by mouth every 6 (six) hours as needed for nausea or vomiting.   Yes [provider]  ?Polyethyl Glycol-Propyl Glycol (SYSTANE) 0.4-0.3 % GEL ophthalmic gel Place 1 application into both eyes every 8 (eight) hours.   Yes [provider]  ?polyethylene glycol (MIRALAX / GLYCOLAX) packet Take 17 g by mouth 2 (two) times daily. 02/07/17  Yes Abraham, Sherin, DO  ?RHOPRESSA 0.02 % SOLN Place 1 drop into the left eye at bedtime.   Yes [provider]  ?risperiDONE (RISPERDAL) 1 MG/ML oral solution Take 2 mg by mouth in the morning.   Yes [provider]  ?sacubitril-valsartan (ENTRESTO) 24-26 MG Take 1 tablet by mouth 2 (two) times daily. 12/27/20  Yes Lyda Jester M, PA-C  ?senna-docusate (SENOKOT-S) 8.6-50 MG tablet Take 1 tablet by mouth at bedtime.   Yes [provider]  ?sertraline (ZOLOFT) 50 MG tablet Take 50 mg by mouth in the morning.   Yes [provider]  ?sitaGLIPtin (JANUVIA) 100 MG tablet Take 100 mg by mouth daily.   Yes [provider]  ?spironolactone (ALDACTONE) 25 MG tablet Take 0.5 tablets (12.5 mg total) by mouth daily. 01/18/21  Yes [place], Maricela Bo, FNP  ?tamsulosin (FLOMAX) 0.4 MG CAPS capsule Take 2 capsules (0.8 mg total) by mouth daily after supper. ?Patient taking differently: Take 0.4 mg by mouth daily after supper. 12/31/16  Yes Caroline More, DO  ?White Petrolatum-Mineral Oil (ALTALUBE) 85-15 % OINT Place 1 application into both eyes at bedtime. 01/23/18  Yes [provider]  ? ? ?Physical Exam: ?Vitals:  ? 07-29-2021 0531 07-29-2021 0545 07-29-2021 0615 07/29/2021 0700  ?BP: (!)  78/68 (!) 79/62 (!) 87/65 107/87  ?Pulse:  92 92 89  ?Resp:  '18 20 13  '$ ?Temp:      ?TempSrc:  Axillary    ?SpO2:  100% (!) 88% 100%  ? ?Exam ? ?Constitutional: Older male who appears critically ill at this time bu

## 2021-07-03 DEATH — deceased

## 2021-09-11 ENCOUNTER — Other Ambulatory Visit (HOSPITAL_COMMUNITY): Payer: Medicaid Other

## 2021-09-11 ENCOUNTER — Encounter (HOSPITAL_COMMUNITY): Payer: Medicaid Other | Admitting: Internal Medicine
